# Patient Record
Sex: Male | Born: 1955 | Race: White | Hispanic: No | Marital: Married | Smoking: Former smoker
Health system: Southern US, Community
[De-identification: ages and names within clinical notes are randomized; demographics above are authoritative.]

## PROBLEM LIST (undated history)

## (undated) DIAGNOSIS — K5792 Diverticulitis of intestine, part unspecified, without perforation or abscess without bleeding: Secondary | ICD-10-CM

## (undated) DIAGNOSIS — I639 Cerebral infarction, unspecified: Secondary | ICD-10-CM

## (undated) DIAGNOSIS — M4802 Spinal stenosis, cervical region: Secondary | ICD-10-CM

## (undated) DIAGNOSIS — I2510 Atherosclerotic heart disease of native coronary artery without angina pectoris: Secondary | ICD-10-CM

## (undated) DIAGNOSIS — E039 Hypothyroidism, unspecified: Secondary | ICD-10-CM

## (undated) DIAGNOSIS — E1140 Type 2 diabetes mellitus with diabetic neuropathy, unspecified: Secondary | ICD-10-CM

## (undated) DIAGNOSIS — Z87442 Personal history of urinary calculi: Secondary | ICD-10-CM

## (undated) DIAGNOSIS — E119 Type 2 diabetes mellitus without complications: Secondary | ICD-10-CM

## (undated) DIAGNOSIS — Z800 Family history of malignant neoplasm of digestive organs: Secondary | ICD-10-CM

## (undated) DIAGNOSIS — C259 Malignant neoplasm of pancreas, unspecified: Secondary | ICD-10-CM

## (undated) DIAGNOSIS — I6522 Occlusion and stenosis of left carotid artery: Secondary | ICD-10-CM

## (undated) DIAGNOSIS — S129XXA Fracture of neck, unspecified, initial encounter: Secondary | ICD-10-CM

## (undated) DIAGNOSIS — I6502 Occlusion and stenosis of left vertebral artery: Secondary | ICD-10-CM

## (undated) HISTORY — PX: APPENDECTOMY: SHX54

## (undated) HISTORY — PX: SPLENECTOMY: SUR1306

## (undated) HISTORY — DX: Malignant neoplasm of pancreas, unspecified: C25.9

## (undated) HISTORY — PX: ROTATOR CUFF REPAIR: SHX139

## (undated) HISTORY — PX: COLON SURGERY: SHX602

## (undated) HISTORY — PX: CARDIAC SURGERY: SHX584

## (undated) HISTORY — PX: TONSILLECTOMY: SUR1361

## (undated) HISTORY — PX: EYE SURGERY: SHX253

## (undated) HISTORY — DX: Atherosclerotic heart disease of native coronary artery without angina pectoris: I25.10

## (undated) HISTORY — PX: COLON RESECTION: SHX5231

## (undated) HISTORY — DX: Family history of malignant neoplasm of digestive organs: Z80.0

---

## 1993-09-04 HISTORY — PX: COLON RESECTION: SHX5231

## 2018-01-19 ENCOUNTER — Emergency Department (HOSPITAL_COMMUNITY): Payer: BLUE CROSS/BLUE SHIELD

## 2018-01-19 ENCOUNTER — Other Ambulatory Visit: Payer: Self-pay

## 2018-01-19 ENCOUNTER — Inpatient Hospital Stay (HOSPITAL_COMMUNITY)
Admission: EM | Admit: 2018-01-19 | Discharge: 2018-01-22 | DRG: 062 | Disposition: A | Discharge status: Home / Self Care | Payer: BLUE CROSS/BLUE SHIELD | Attending: Neurology | Admitting: Neurology

## 2018-01-19 ENCOUNTER — Encounter (HOSPITAL_COMMUNITY): Payer: Self-pay | Admitting: Emergency Medicine

## 2018-01-19 DIAGNOSIS — Z79899 Other long term (current) drug therapy: Secondary | ICD-10-CM

## 2018-01-19 DIAGNOSIS — I672 Cerebral atherosclerosis: Secondary | ICD-10-CM | POA: Diagnosis present

## 2018-01-19 DIAGNOSIS — D72819 Decreased white blood cell count, unspecified: Secondary | ICD-10-CM | POA: Diagnosis present

## 2018-01-19 DIAGNOSIS — Z885 Allergy status to narcotic agent status: Secondary | ICD-10-CM | POA: Diagnosis not present

## 2018-01-19 DIAGNOSIS — G9589 Other specified diseases of spinal cord: Secondary | ICD-10-CM | POA: Diagnosis present

## 2018-01-19 DIAGNOSIS — R29701 NIHSS score 1: Secondary | ICD-10-CM | POA: Diagnosis present

## 2018-01-19 DIAGNOSIS — E871 Hypo-osmolality and hyponatremia: Secondary | ICD-10-CM | POA: Diagnosis present

## 2018-01-19 DIAGNOSIS — R200 Anesthesia of skin: Secondary | ICD-10-CM | POA: Diagnosis present

## 2018-01-19 DIAGNOSIS — I69351 Hemiplegia and hemiparesis following cerebral infarction affecting right dominant side: Secondary | ICD-10-CM | POA: Diagnosis not present

## 2018-01-19 DIAGNOSIS — I6523 Occlusion and stenosis of bilateral carotid arteries: Secondary | ICD-10-CM | POA: Diagnosis present

## 2018-01-19 DIAGNOSIS — G459 Transient cerebral ischemic attack, unspecified: Secondary | ICD-10-CM | POA: Diagnosis not present

## 2018-01-19 DIAGNOSIS — I10 Essential (primary) hypertension: Secondary | ICD-10-CM | POA: Diagnosis present

## 2018-01-19 DIAGNOSIS — Z87891 Personal history of nicotine dependence: Secondary | ICD-10-CM | POA: Diagnosis not present

## 2018-01-19 DIAGNOSIS — I63412 Cerebral infarction due to embolism of left middle cerebral artery: Principal | ICD-10-CM | POA: Diagnosis present

## 2018-01-19 DIAGNOSIS — E039 Hypothyroidism, unspecified: Secondary | ICD-10-CM | POA: Diagnosis present

## 2018-01-19 DIAGNOSIS — I63232 Cerebral infarction due to unspecified occlusion or stenosis of left carotid arteries: Secondary | ICD-10-CM | POA: Diagnosis not present

## 2018-01-19 DIAGNOSIS — E119 Type 2 diabetes mellitus without complications: Secondary | ICD-10-CM | POA: Diagnosis not present

## 2018-01-19 DIAGNOSIS — Z8673 Personal history of transient ischemic attack (TIA), and cerebral infarction without residual deficits: Secondary | ICD-10-CM | POA: Diagnosis not present

## 2018-01-19 DIAGNOSIS — E1165 Type 2 diabetes mellitus with hyperglycemia: Secondary | ICD-10-CM | POA: Diagnosis present

## 2018-01-19 DIAGNOSIS — E1140 Type 2 diabetes mellitus with diabetic neuropathy, unspecified: Secondary | ICD-10-CM | POA: Diagnosis present

## 2018-01-19 DIAGNOSIS — R29898 Other symptoms and signs involving the musculoskeletal system: Secondary | ICD-10-CM

## 2018-01-19 DIAGNOSIS — I6522 Occlusion and stenosis of left carotid artery: Secondary | ICD-10-CM | POA: Diagnosis not present

## 2018-01-19 DIAGNOSIS — I679 Cerebrovascular disease, unspecified: Secondary | ICD-10-CM

## 2018-01-19 DIAGNOSIS — R531 Weakness: Secondary | ICD-10-CM | POA: Diagnosis present

## 2018-01-19 DIAGNOSIS — I63239 Cerebral infarction due to unspecified occlusion or stenosis of unspecified carotid arteries: Secondary | ICD-10-CM

## 2018-01-19 DIAGNOSIS — I639 Cerebral infarction, unspecified: Secondary | ICD-10-CM

## 2018-01-19 DIAGNOSIS — Z7982 Long term (current) use of aspirin: Secondary | ICD-10-CM | POA: Diagnosis not present

## 2018-01-19 DIAGNOSIS — I63019 Cerebral infarction due to thrombosis of unspecified vertebral artery: Secondary | ICD-10-CM

## 2018-01-19 DIAGNOSIS — M4802 Spinal stenosis, cervical region: Secondary | ICD-10-CM

## 2018-01-19 DIAGNOSIS — Z7984 Long term (current) use of oral hypoglycemic drugs: Secondary | ICD-10-CM

## 2018-01-19 DIAGNOSIS — D696 Thrombocytopenia, unspecified: Secondary | ICD-10-CM | POA: Diagnosis present

## 2018-01-19 DIAGNOSIS — E785 Hyperlipidemia, unspecified: Secondary | ICD-10-CM | POA: Diagnosis present

## 2018-01-19 DIAGNOSIS — R2990 Unspecified symptoms and signs involving the nervous system: Secondary | ICD-10-CM

## 2018-01-19 DIAGNOSIS — I63119 Cerebral infarction due to embolism of unspecified vertebral artery: Secondary | ICD-10-CM

## 2018-01-19 DIAGNOSIS — E1159 Type 2 diabetes mellitus with other circulatory complications: Secondary | ICD-10-CM | POA: Diagnosis not present

## 2018-01-19 DIAGNOSIS — I6521 Occlusion and stenosis of right carotid artery: Secondary | ICD-10-CM | POA: Diagnosis not present

## 2018-01-19 DIAGNOSIS — E782 Mixed hyperlipidemia: Secondary | ICD-10-CM | POA: Diagnosis present

## 2018-01-19 HISTORY — DX: Spinal stenosis, cervical region: M48.02

## 2018-01-19 HISTORY — DX: Fracture of neck, unspecified, initial encounter: S12.9XXA

## 2018-01-19 HISTORY — DX: Cerebral infarction, unspecified: I63.9

## 2018-01-19 HISTORY — DX: Type 2 diabetes mellitus without complications: E11.9

## 2018-01-19 LAB — DIFFERENTIAL
BASOS ABS: 0 10*3/uL (ref 0.0–0.1)
BASOS PCT: 0 %
Eosinophils Absolute: 0 10*3/uL (ref 0.0–0.7)
Eosinophils Relative: 1 %
LYMPHS ABS: 1 10*3/uL (ref 0.7–4.0)
Lymphocytes Relative: 42 %
MONO ABS: 0.2 10*3/uL (ref 0.1–1.0)
MONOS PCT: 7 %
NEUTROS ABS: 1.2 10*3/uL — AB (ref 1.7–7.7)
Neutrophils Relative %: 50 %

## 2018-01-19 LAB — CBC
HEMATOCRIT: 37.7 % — AB (ref 39.0–52.0)
HEMOGLOBIN: 13.4 g/dL (ref 13.0–17.0)
MCH: 32.6 pg (ref 26.0–34.0)
MCHC: 35.5 g/dL (ref 30.0–36.0)
MCV: 91.7 fL (ref 78.0–100.0)
Platelets: 87 10*3/uL — ABNORMAL LOW (ref 150–400)
RBC: 4.11 MIL/uL — ABNORMAL LOW (ref 4.22–5.81)
RDW: 13.7 % (ref 11.5–15.5)
WBC: 2.5 10*3/uL — AB (ref 4.0–10.5)

## 2018-01-19 LAB — COMPREHENSIVE METABOLIC PANEL
ALK PHOS: 49 U/L (ref 38–126)
ALT: 17 U/L (ref 17–63)
AST: 17 U/L (ref 15–41)
Albumin: 4.1 g/dL (ref 3.5–5.0)
Anion gap: 7 (ref 5–15)
BILIRUBIN TOTAL: 1.4 mg/dL — AB (ref 0.3–1.2)
BUN: 20 mg/dL (ref 6–20)
CALCIUM: 9.1 mg/dL (ref 8.9–10.3)
CHLORIDE: 100 mmol/L — AB (ref 101–111)
CO2: 26 mmol/L (ref 22–32)
Creatinine, Ser: 0.69 mg/dL (ref 0.61–1.24)
Glucose, Bld: 233 mg/dL — ABNORMAL HIGH (ref 65–99)
Potassium: 3.6 mmol/L (ref 3.5–5.1)
Sodium: 133 mmol/L — ABNORMAL LOW (ref 135–145)
TOTAL PROTEIN: 7 g/dL (ref 6.5–8.1)

## 2018-01-19 LAB — I-STAT CHEM 8, ED
BUN: 20 mg/dL (ref 6–20)
CALCIUM ION: 1.16 mmol/L (ref 1.15–1.40)
CHLORIDE: 101 mmol/L (ref 101–111)
CREATININE: 0.7 mg/dL (ref 0.61–1.24)
GLUCOSE: 226 mg/dL — AB (ref 65–99)
HCT: 39 % (ref 39.0–52.0)
HEMOGLOBIN: 13.3 g/dL (ref 13.0–17.0)
Potassium: 3.6 mmol/L (ref 3.5–5.1)
Sodium: 138 mmol/L (ref 135–145)
TCO2: 24 mmol/L (ref 22–32)

## 2018-01-19 LAB — MRSA PCR SCREENING: MRSA BY PCR: NEGATIVE

## 2018-01-19 LAB — I-STAT TROPONIN, ED: TROPONIN I, POC: 0 ng/mL (ref 0.00–0.08)

## 2018-01-19 LAB — PROTIME-INR
INR: 1.08
Prothrombin Time: 13.9 seconds (ref 11.4–15.2)

## 2018-01-19 LAB — CBG MONITORING, ED: Glucose-Capillary: 228 mg/dL — ABNORMAL HIGH (ref 65–99)

## 2018-01-19 LAB — ETHANOL

## 2018-01-19 LAB — APTT: aPTT: 27 seconds (ref 24–36)

## 2018-01-19 MED ORDER — SODIUM CHLORIDE 0.9 % IV SOLN
INTRAVENOUS | Status: AC
  Administered 2018-01-19: 19:00:00 via INTRAVENOUS

## 2018-01-19 MED ORDER — SODIUM CHLORIDE 0.9 % IV SOLN
50.0000 mL | Freq: Once | INTRAVENOUS | Status: DC
Start: 2018-01-19 — End: 2018-01-19

## 2018-01-19 MED ORDER — SODIUM CHLORIDE 0.9 % IV SOLN
50.0000 mL/h | INTRAVENOUS | Status: DC
  Administered 2018-01-20 – 2018-01-21 (×2): 50 mL/h via INTRAVENOUS

## 2018-01-19 MED ORDER — ACETAMINOPHEN 650 MG RE SUPP: 650.0000 mg | RECTAL | Status: DC | PRN

## 2018-01-19 MED ORDER — ACETAMINOPHEN 325 MG PO TABS
650.0000 mg | ORAL_TABLET | ORAL | Status: DC | PRN
  Administered 2018-01-20 – 2018-01-21 (×3): 650 mg via ORAL
  Filled 2018-01-19 (×3): qty 2

## 2018-01-19 MED ORDER — SODIUM CHLORIDE 0.9 % IV SOLN: 50.0000 mL | Freq: Once | INTRAVENOUS | Status: DC

## 2018-01-19 MED ORDER — ALTEPLASE (STROKE) FULL DOSE INFUSION: 0.9000 mg/kg | Freq: Once | INTRAVENOUS | Status: DC

## 2018-01-19 MED ORDER — LABETALOL HCL 5 MG/ML IV SOLN: 10.0000 mg | Freq: Once | INTRAVENOUS | Status: DC | PRN

## 2018-01-19 MED ORDER — NICARDIPINE HCL IN NACL 20-0.86 MG/200ML-% IV SOLN: 0.0000 mg/h | INTRAVENOUS | Status: DC | PRN

## 2018-01-19 MED ORDER — STROKE: EARLY STAGES OF RECOVERY BOOK
Freq: Once | Status: AC
  Administered 2018-01-20: 01:00:00
  Filled 2018-01-19: qty 1

## 2018-01-19 MED ORDER — ALTEPLASE (STROKE) FULL DOSE INFUSION
0.9000 mg/kg | Freq: Once | INTRAVENOUS | Status: AC
  Administered 2018-01-19: 63.7 mg via INTRAVENOUS

## 2018-01-19 MED ORDER — ALTEPLASE 100 MG IV SOLR
INTRAVENOUS | Status: AC
  Administered 2018-01-19: 63.7 mg via INTRAVENOUS
  Filled 2018-01-19: qty 100

## 2018-01-19 MED ORDER — PANTOPRAZOLE SODIUM 40 MG PO TBEC
40.0000 mg | DELAYED_RELEASE_TABLET | Freq: Every day | ORAL | Status: DC
  Administered 2018-01-20 – 2018-01-22 (×3): 40 mg via ORAL
  Filled 2018-01-19 (×3): qty 1

## 2018-01-19 MED ORDER — ACETAMINOPHEN 160 MG/5ML PO SOLN: 650.0000 mg | ORAL | Status: DC | PRN

## 2018-01-19 MED ORDER — INSULIN ASPART 100 UNIT/ML ~~LOC~~ SOLN
0.0000 [IU] | Freq: Three times a day (TID) | SUBCUTANEOUS | Status: DC
  Administered 2018-01-20 (×2): 3 [IU] via SUBCUTANEOUS
  Administered 2018-01-20: 5 [IU] via SUBCUTANEOUS
  Administered 2018-01-21: 2 [IU] via SUBCUTANEOUS
  Administered 2018-01-21 (×2): 3 [IU] via SUBCUTANEOUS
  Administered 2018-01-22: 2 [IU] via SUBCUTANEOUS
  Administered 2018-01-22: 3 [IU] via SUBCUTANEOUS

## 2018-01-19 NOTE — Progress Notes (Signed)
Code Stroke Times:  1748 Call Time  [place] Exam Started 5003 Exam Finished 7048 Images sent  8891 Exam completed [place] [facility] called

## 2018-01-19 NOTE — ED Notes (Signed)
Call CT Code Stroke.@ 1750

## 2018-01-19 NOTE — ED Triage Notes (Signed)
Pt presents with right hand numbness, decreased sensation  And unable to open hand completley approx 3pm

## 2018-01-19 NOTE — ED Notes (Signed)
Beeped out Code Stroke

## 2018-01-19 NOTE — ED Provider Notes (Signed)
[facility] EMERGENCY DEPARTMENT Provider Note   CSN: [id number] Arrival date & time: 01/19/18  1741   An emergency department physician performed an initial assessment on this suspected stroke patient at 1750.  History   Chief Complaint Chief Complaint  Patient presents with  . Code Stroke    HPI Mark Foster is a 62 y.o. male.  The history is provided by the patient and the spouse. The history is limited by the condition of the patient (acuity of condition).   Pt was seen at 1750. Per pt and his wife: Pt states they were sitting and he went to brush a fly off of his wife's face with his right hand when he realized that he could not move his hand as usual and "it was all numb." Pt states he feels his RUE "isn't working right" by arrival to ED. Pt states his symptoms began at 1500 today PTA. Pt is right handed. Denies headache, no new neck or back pain, no CP/SOB, no abd pain, no N/V/D, no injury.    Past Medical History:  Diagnosis Date  . Cervical compression fracture ([place])   . Cervical spinal stenosis   . Stroke [facility])    2011    There are no active problems to display for this patient.   History reviewed. No pertinent surgical history.      Home Medications    Prior to Admission medications   Not on File    Family History No family history on file.  Social History Social History   Tobacco Use  . Smoking status: Never Smoker  . Smokeless tobacco: Never Used  Substance Use Topics  . Alcohol use: Yes  . Drug use: Never     Allergies   Patient has no known allergies.   Review of Systems Review of Systems  Unable to perform ROS: Acuity of condition     Physical Exam Updated Vital Signs Ht 5\' 11"  (1.803 m)   Wt 70.8 kg (156 lb)   BMI 21.76 kg/m    Patient Vitals for the past 24 hrs:  BP Temp Pulse Resp SpO2 Height Weight  01/19/18 1927 113/86 97.7 F (36.5 C) 95 20 97 % - -  01/19/18 1912 124/64 97.7 F (36.5 C) 95 18 96 % - -  01/19/18  1857 (!) 142/77 (!) 97.4 F (36.3 C) 98 15 96 % - -  01/19/18 1842 136/80 (!) 97.4 F (36.3 C) 99 14 97 % - -  01/19/18 1837 - 98.1 F (36.7 C) - - - - -  01/19/18 1830 135/81 - (!) 102 17 97 % - -  01/19/18 1828 (!) 141/78 - (!) 105 (!) 23 98 % - -  01/19/18 1817 - - - - - - 70.8 kg (156 lb)  01/19/18 1755 - - - - - 5\' 11"  (1.803 m) 65.8 kg (145 lb)     Physical Exam 1750: Physical examination:  Nursing notes reviewed; Vital signs and O2 SAT reviewed;  Constitutional: Well developed, Well nourished, Well hydrated, In no acute distress; Head:  Normocephalic, atraumatic; Eyes: EOMI, PERRL, No scleral icterus; ENMT: Mouth and pharynx normal, Mucous membranes moist; Neck: Supple, Full range of motion, No lymphadenopathy; Cardiovascular: Regular rate and rhythm, No gallop; Respiratory: Breath sounds clear & equal bilaterally, No wheezes.  Speaking full sentences with ease, Normal respiratory effort/excursion; Chest: Nontender, Movement normal; Abdomen: Soft, Nontender, Nondistended, Normal bowel sounds; Genitourinary: No CVA tenderness; Extremities: Peripheral pulses normal, No tenderness, No edema, No calf  edema or asymmetry.; Neuro: AA&Ox3, Major CN grossly intact. Speech clear.  No facial droop.  Grips L>R. Strength 5/5 equal bilat LE's and LUE, strength 4/5 RUE.  DTR 2/4 equal bilat UE's and LE's. +right hand subjective decreased sensation.  Normal cerebellar testing LUE and ataxic RUE (finger-nose), ataxic bilat LE's (heel-shin)..; Skin: Color normal, Warm, Dry.   ED Treatments / Results  Labs (all labs ordered are listed, but only abnormal results are displayed)   EKG EKG Interpretation  Date/Time:  Saturday Jan 19 2018 18:20:24 EDT Ventricular Rate:  98 PR Interval:    QRS Duration: 94 QT Interval:  348 QTC Calculation: 445 R Axis:   67 Text Interpretation:  Sinus rhythm No old tracing to compare Confirmed by Francine Graven [phone]) on 01/19/2018 6:22:54  PM   Radiology   Procedures Procedures (including critical care time)  Medications Ordered in ED Medications - No data to display   Initial Impression / Assessment and Plan / ED Course  I have reviewed the triage vital signs and the nursing notes.  Pertinent labs & imaging results that were available during my care of the patient were reviewed by me and considered in my medical decision making (see chart for details).  MDM Reviewed: previous chart, nursing note and vitals Reviewed previous: labs and ECG Interpretation: labs, ECG and CT scan Total time providing critical care: 30-74 minutes. This excludes time spent performing separately reportable procedures and services. Consults: neurology and admitting MD    CRITICAL CARE Performed by: Alfonzo Feller Total critical care time: 35 minutes Critical care time was exclusive of separately billable procedures and treating other patients. Critical care was necessary to treat or prevent imminent or life-threatening deterioration. Critical care was time spent personally by me on the following activities: development of treatment plan with patient and/or surrogate as well as nursing, discussions with consultants, evaluation of patient's response to treatment, examination of patient, obtaining history from patient or surrogate, ordering and performing treatments and interventions, ordering and review of laboratory studies, ordering and review of radiographic studies, pulse oximetry and re-evaluation of patient's condition.   Results for orders placed or performed during the hospital encounter of 01/19/18  Protime-INR  Result Value Ref Range   Prothrombin Time 13.9 11.4 - 15.2 seconds   INR 1.08   APTT  Result Value Ref Range   aPTT 27 24 - 36 seconds  CBC  Result Value Ref Range   WBC 2.5 (L) 4.0 - 10.5 K/uL   RBC 4.11 (L) 4.22 - 5.81 MIL/uL   Hemoglobin 13.4 13.0 - 17.0 g/dL   HCT 37.7 (L) 39.0 - 52.0 %   MCV 91.7 78.0 -  100.0 fL   MCH 32.6 26.0 - 34.0 pg   MCHC 35.5 30.0 - 36.0 g/dL   RDW 13.7 11.5 - 15.5 %   Platelets 87 (L) 150 - 400 K/uL  Differential  Result Value Ref Range   Neutrophils Relative % 50 %   Neutro Abs 1.2 (L) 1.7 - 7.7 K/uL   Lymphocytes Relative 42 %   Lymphs Abs 1.0 0.7 - 4.0 K/uL   Monocytes Relative 7 %   Monocytes Absolute 0.2 0.1 - 1.0 K/uL   Eosinophils Relative 1 %   Eosinophils Absolute 0.0 0.0 - 0.7 K/uL   Basophils Relative 0 %   Basophils Absolute 0.0 0.0 - 0.1 K/uL  I-Stat Chem 8, ED  (not at [facility], [facility])  Result Value Ref Range   Sodium 138 135 - 145  mmol/L   Potassium 3.6 3.5 - 5.1 mmol/L   Chloride 101 101 - 111 mmol/L   BUN 20 6 - 20 mg/dL   Creatinine, Ser 0.70 0.61 - 1.24 mg/dL   Glucose, Bld 226 (H) 65 - 99 mg/dL   Calcium, Ion 1.16 1.15 - 1.40 mmol/L   TCO2 24 22 - 32 mmol/L   Hemoglobin 13.3 13.0 - 17.0 g/dL   HCT 39.0 39.0 - 52.0 %  CBG monitoring, ED  Result Value Ref Range   Glucose-Capillary 228 (H) 65 - 99 mg/dL    Ct Head Code Stroke Wo Contrast Result Date: 01/19/2018 CLINICAL DATA:  Code stroke. RIGHT hand numbness and decreased sensation since this afternoon. EXAM: CT HEAD WITHOUT CONTRAST TECHNIQUE: Contiguous axial images were obtained from the base of the skull through the vertex without intravenous contrast. COMPARISON:  None. FINDINGS: BRAIN: No intraparenchymal hemorrhage, mass effect nor midline shift. LEFT anterior parietal lobe encephalomalacia. Mild ex vacuo dilatation LEFT lateral ventricle, borderline parenchymal brain volume loss for age. No hydrocephalus. No acute large vascular territory infarct. No abnormal extra-axial fluid collections. Symmetric basal ganglia mineralization. The basal cisterns are patent. VASCULAR: Moderate to severe calcific atherosclerosis of the carotid siphons. SKULL: No skull fracture. No significant scalp soft tissue swelling. SINUSES/ORBITS: The mastoid air-cells and included paranasal sinuses are  well-aerated.The included ocular globes and orbital contents are non-suspicious. OTHER: Patient is edentulous. ASPECTS [facility] Stroke Program Early CT Score) - Ganglionic level infarction (caudate, lentiform nuclei, internal capsule, insula, M1-M3 cortex): 7 - Supraganglionic infarction (M4-M6 cortex): 3 Total score (0-10 with 10 being normal): 10 IMPRESSION: 1. No acute intracranial process. 2. ASPECTS is 10. 3. Old small LEFT parietal/MCA territory infarct. 4. Critical Value/emergent results were called by telephone at the time of interpretation on 01/19/2018 at 6:05 pm to Dr. Francine Graven , who verbally acknowledged these results. Electronically Signed   By: Elon Alas M.D.   On: 01/19/2018 18:07    1820:  Code Stroke called on pt's arrival. TeleNeuro Dr. Beau Fanny has evaluated pt, recommends TPA, he explained risks/benefits to pt and pt wishes to proceed. TPA ordered.  T/C returned from [facility] Neuro Dr. Rory Percy, case discussed, including:  HPI, pertinent PM/SHx, VS/PE, dx testing, ED course and treatment:  Agreeable to accept transfer/admit, requests to write temporary orders, obtain Neuro ICU bed to his service/Neuro MD service.   Final Clinical Impressions(s) / ED Diagnoses   Final diagnoses:  None    ED Discharge Orders    None       Francine Graven, DO 01/23/18 0719

## 2018-01-19 NOTE — H&P (Signed)
Neurology H&P  CC: Right arm weakness  History is obtained from: Patient  HPI: Mark Foster is a 62 y.o. male with a history of cervical stenosis, diabetic neuropathy and type 2 diabetes as well as previous stroke who presents with right arm weakness that started abruptly around 3 PM.  He states that he was going to bat away a bug and found that his hand would not work.  [facility] was evaluated by tele-neurology and IV TPA was administered.  He did have some neck pain earlier today.    LKW: 3 PM tpa given?:  Yes Modified Rankin Scale: 1-No significant post stroke disability and can perform usual duties with stroke symptoms  ROS: A 14 point ROS was performed and is negative except as noted in the HPI.   Past Medical History:  Diagnosis Date  . Cervical compression fracture ([place])   . Cervical spinal stenosis   . Stroke [facility])    2011  . Type 2 diabetes mellitus ([facility])      No family history on file.   Social History:  reports that he has never smoked. He has never used smokeless tobacco. He reports that he drinks alcohol. He reports that he does not use drugs.   Exam: Current vital signs: BP 121/76   Pulse 89   Temp 97.7 F (36.5 C)   Resp 20   Ht 5\' 11"  (1.803 m)   Wt 70.8 kg (156 lb)   SpO2 99%   BMI 21.76 kg/m  Vital signs in last 24 hours: Temp:  [97.4 F (36.3 C)-98.1 F (36.7 C)] 97.7 F (36.5 C) (05/18 1942) Pulse Rate:  [89-105] 89 (05/18 2045) Resp:  [14-23] 20 (05/18 2045) BP: (113-142)/(64-86) 121/76 (05/18 2045) SpO2:  [96 %-100 %] 99 % (05/18 2045) Weight:  [65.8 kg (145 lb)-70.8 kg (156 lb)] 70.8 kg (156 lb) (05/18 1817)  Physical Exam  Constitutional: Appears well-developed and well-nourished.  Psych: Affect appropriate to situation Eyes: No scleral injection HENT: No OP obstrucion Head: Normocephalic.  Cardiovascular: Normal rate and regular rhythm.  Respiratory: Effort normal and breath sounds  normal to anterior ascultation GI: Soft.  No distension. There is no tenderness.  Skin: WDI  Neuro: Mental Status: Patient is awake, alert, oriented to person, place, month, year, and situation. Patient is able to give a clear and coherent history. No signs of aphasia or neglect Cranial Nerves: II: Visual Fields are full. Pupils are equal, round, and reactive to light.   III,IV, VI: EOMI without ptosis or diploplia.  V: Facial sensation is symmetric to temperature VII: Facial movement is symmetric.  VIII: hearing is intact to voice X: Uvula elevates symmetrically XI: Shoulder shrug is symmetric. XII: tongue is midline without atrophy or fasciculations.  Motor: Tone is normal. Bulk is normal.  He has 3 out of 5 distally, 4+ out of 5 proximally in his right arm, he is 4-4+/5 in his left arm, and 4/5 bilaterally in his legs. Sensory: Sensation is diminished to temperature to above the knees bilaterally  Deep Tendon Reflexes: 2+ and symmetric at the patellae.  And trace at the ankles bilaterally. 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  I would favor getting an MRI of the cervical spine as well.  Recommendations: 1. HgbA1c, fasting lipid panel 2. MRI, MRA  of the brain without contrast, MRA neck 3. Frequent neuro checks 4. Echocardiogram 5.  MRI cervical spine 6.  Prophylactic therapy-none for 24 hours 7. Risk factor modification 8. Telemetry monitoring 9. PT consult, OT consult, Speech consult 10. please page stroke NP  Or  PA  Or MD  from 8am -4 pm as this patient will be followed by the stroke team at this point.   You can look them up on [email or web address]    Roland Rack, MD [facility] [phone]  If 7pm- 7am, please page neurology on call as listed in [place]. 01/19/2018  9:14 PM

## 2018-01-19 NOTE — Consult Note (Signed)
[facility] TeleNeurology Consult Services  Impression:  Stroke  Symptoms not consistent with LVO therefore no NIR  Differential Diagnosis:  1. Cardioembolic stroke  2. Small vessel disease/lacune  3. Thromboembolic, artery-to-artery mechanism  4. Hypercoagulable state-related infarct  5. Transient ischemic attack  6. Thrombotic mechanism, large artery disease   Comments:   LKW: 15:00 Door time:  17:41 [facility] contacted: 18:00 [facility] at bedside: 18:05 NIHSS assessment time: 18:06 Verbal tpa order:18:15 Needle time: 18:28  Verbal Consent to tPA:  I have explained to the patient/family/guardian the nature of the patient's condition, the use of tPA fibrinolytic agent, and the benefits to be reasonably expected compared with alternative approaches. I have discussed the likelihood of major risks or complications of this procedure including (if applicable) but not limited to loss of limb function, brain damage, paralysis, hemorrhage, infection, complications from transfusion of blood components, drug reactions, blood clots and loss of life. I have also indicated that with any procedure there is always the possibility of an unexpected complication. I have explained the risks which include:    1. Death, Stroke or permanent neurologic injury (paralysis, coma, etc)   2. Worsening of stroke symptoms from swelling or bleeding in the brain   3. Bleeding in other parts of the body   4. Need for blood transfusions to replace blood or clotting factors   5. Allergic reaction to medications   6. Other unexpected complications    All questions were answered and the patient/family/guardian express understanding of the treatment plan and consent to the procedure.  Our recommendations are outlined below.   We will be seeing the patient back in follow up as noted.    Recommendations:   IV tPA - dose = 63.7 mg Routine post tPA monitoring including neuro checks and blood  pressure control during/after treatment Monitor blood pressure Check blood pressure and NIHSS every 15 min for 2 h, then every 30 min for 6 h, and finally every hour for 16 h  Systolic greater than 381 OR diastolic greater than 017: Option 1: Labetalol 10 mg IV for 1 - 2 min May repeat or double labetalol every 10 min to maximum dose of 300 mg, or give initial labetalol dose, then start labetalol drip at 2 - 8 mg/min. Option 2: Nicardipine 5 mg/h IV infusion as initial dose and titrate to desired effect by increasing 2.5 mg/h every 5 min to maximum of 15 mg/h;  If blood pressure is not controlled by labetolol or nicardipine, consider sodium nitroprusside.  Admission to ICU CT brain 24 hours post tPA NPO until swallowing screen performed and passed No antiplatelet agents or anticoagulants (including heparin for DVT prophylaxis) in first 24 hours No Foley catheter, nasogastric tube, arterial catheter or central venous catheter for 24 hr, unless absolutely necessary Telemetry   Inpatient Neurology Consultation Stroke evaluation as per inpatient neurology recommendations Discussed with ED MD   ------------------------------------------------------------------------------  CC: Right hand weakness  History of Present Illness   Patient is a  62 YO M with h/o stroke and Lt ICA occlusion and spinal stenosis presented with right hand weakness that started suddenly at 15:00. His right hand is weak and numb. No other deficits. Risks/benefits and contraindications of TPA were discussed with patient and his wife. No contraindications and he wanted to proceed with TPA.  Diagnostics CT head: No acute findings.  Exam  NIH Stroke Scale/Score (NIHSS)   RESULT SUMMARY: 1 points NIH Stroke Scale   INPUTS: 1A: Level of consciousness ->  0 = Alert; keenly responsive 1B: Ask month and age -> 0 = Both questions right 1C: 'Blink eyes' & 'squeeze hands' -> 0 = Performs both tasks 2: Horizontal  extraocular movements -> 0 = Normal 3: Visual fields -> 0 = No visual loss 4: Facial palsy -> 0 = Normal symmetry 5A: Left arm motor drift -> 0 = No drift for 10 seconds 5B: Right arm motor drift -> 0 = No drift for 10 seconds 6A: Left leg motor drift -> 0 = No drift for 5 seconds 6B: Right leg motor drift -> 0 = No drift for 5 seconds 7: Limb Ataxia -> 0 = No ataxia 8: Sensation -> 1 = Mild-moderate loss: less sharp/more dull  9: Language/aphasia -> 0 = Normal; no aphasia 10: Dysarthria -> 0 = Normal 11: Extinction/inattention -> 0 = No abnormality  Medical Decision Making:  - Extensive number of diagnosis or management options are considered above.   - Extensive amount of complex data reviewed.   - High risk of complication and/or morbidity or mortality are associated with differential diagnostic considerations above.  - There may be Uncertain outcome and increased probability of prolonged functional impairment or high probability of severe prolonged functional impairment associated with some of these differential diagnosis.  Medical Data Reviewed:  1.Data reviewed include clinical labs, radiology,  Medical Tests;   2.Tests results discussed w/performing or interpreting physician;   3.Obtaining/reviewing old medical records;  4.Obtaining case history from another source;  5.Independent review of image, tracing or specimen.    Patient was informed the neurology consult would happen via telehealth consult by way of interactive audio and video telecommunications and consented to receiving care in this manner.

## 2018-01-19 NOTE — ED Notes (Signed)
Alteplase bolus of 6.4 ml adm at this time. Tele neuro at bedside

## 2018-01-20 ENCOUNTER — Inpatient Hospital Stay (HOSPITAL_COMMUNITY): Payer: BLUE CROSS/BLUE SHIELD

## 2018-01-20 DIAGNOSIS — I6521 Occlusion and stenosis of right carotid artery: Secondary | ICD-10-CM

## 2018-01-20 DIAGNOSIS — E1159 Type 2 diabetes mellitus with other circulatory complications: Secondary | ICD-10-CM

## 2018-01-20 DIAGNOSIS — E785 Hyperlipidemia, unspecified: Secondary | ICD-10-CM

## 2018-01-20 DIAGNOSIS — I63232 Cerebral infarction due to unspecified occlusion or stenosis of left carotid arteries: Secondary | ICD-10-CM

## 2018-01-20 LAB — HEMOGLOBIN A1C
HEMOGLOBIN A1C: 9.7 % — AB (ref 4.8–5.6)
MEAN PLASMA GLUCOSE: 231.69 mg/dL

## 2018-01-20 LAB — CBC
HEMATOCRIT: 35.5 % — AB (ref 39.0–52.0)
HEMOGLOBIN: 12.6 g/dL — AB (ref 13.0–17.0)
MCH: 33.2 pg (ref 26.0–34.0)
MCHC: 35.5 g/dL (ref 30.0–36.0)
MCV: 93.4 fL (ref 78.0–100.0)
Platelets: 88 10*3/uL — ABNORMAL LOW (ref 150–400)
RBC: 3.8 MIL/uL — ABNORMAL LOW (ref 4.22–5.81)
RDW: 13.6 % (ref 11.5–15.5)
WBC: 2.7 10*3/uL — ABNORMAL LOW (ref 4.0–10.5)

## 2018-01-20 LAB — URINALYSIS, ROUTINE W REFLEX MICROSCOPIC
BACTERIA UA: NONE SEEN
Bilirubin Urine: NEGATIVE
Glucose, UA: >=500 mg/dL — AB
Hgb urine dipstick: NEGATIVE
KETONES UR: NEGATIVE mg/dL
Leukocytes, UA: NEGATIVE
Nitrite: NEGATIVE
PH: 6 (ref 5.0–8.0)
Protein, ur: NEGATIVE mg/dL
SPECIFIC GRAVITY, URINE: 1.017 (ref 1.005–1.030)

## 2018-01-20 LAB — HIV ANTIBODY (ROUTINE TESTING W REFLEX): HIV Screen 4th Generation wRfx: NONREACTIVE

## 2018-01-20 LAB — LIPID PANEL
Cholesterol: 168 mg/dL (ref 0–200)
HDL: 45 mg/dL (ref >40)
LDL CALC: 113 mg/dL — AB (ref 0–99)
TRIGLYCERIDES: 51 mg/dL (ref <150)
Total CHOL/HDL Ratio: 3.7 RATIO
VLDL: 10 mg/dL (ref 0–40)

## 2018-01-20 LAB — GLUCOSE, CAPILLARY
GLUCOSE-CAPILLARY: 225 mg/dL — AB (ref 65–99)
GLUCOSE-CAPILLARY: 203 mg/dL — AB (ref 65–99)
Glucose-Capillary: 198 mg/dL — ABNORMAL HIGH (ref 65–99)
Glucose-Capillary: 167 mg/dL — ABNORMAL HIGH (ref 65–99)
Glucose-Capillary: 172 mg/dL — ABNORMAL HIGH (ref 65–99)

## 2018-01-20 LAB — RAPID URINE DRUG SCREEN, HOSP PERFORMED
AMPHETAMINES: NOT DETECTED
BARBITURATES: NOT DETECTED
Benzodiazepines: NOT DETECTED
COCAINE: NOT DETECTED
Opiates: NOT DETECTED
TETRAHYDROCANNABINOL: POSITIVE — AB

## 2018-01-20 MED ORDER — ATORVASTATIN CALCIUM 80 MG PO TABS
80.0000 mg | ORAL_TABLET | Freq: Every day | ORAL | Status: DC
  Administered 2018-01-20 – 2018-01-21 (×2): 80 mg via ORAL
  Filled 2018-01-20 (×2): qty 1

## 2018-01-20 MED ORDER — GADOBENATE DIMEGLUMINE 529 MG/ML IV SOLN
13.0000 mL | Freq: Once | INTRAVENOUS | Status: AC | PRN
  Administered 2018-01-20: 13 mL via INTRAVENOUS
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
## 2018-01-20 NOTE — Evaluation (Signed)
Occupational Therapy Evaluation Patient Details Name: Mark Foster MRN: [id number] DOB: 07-16-1956 Today's Date: 01/20/2018    History of Present Illness 62 y.o. male with history of a previous stroke, hypothyroidism, cervical spinal stenosis, and diabetes mellitus, presenting with right arm weakness. The patient received IV TPA Saturday 01/19/2018. Imaging showing Chronic left posterior MCA infarct and Abnormal flow void within the left ICA, consistent with occlusion per report.   Clinical Impression   Pt admitted with above. He demonstrates the below listed deficits and will benefit from continued OT to maximize safety and independence with BADLs.  Pt presents to OT with Rt UE weakness as well as very poor balance.  He currently requires mod A for LB ADLs and functional tranfers.  He is at risk for falls due to severity of balance deificts.  Recommend CIR to allow him to return safely to mod I level, and reduce risk of injury and readmission.       Follow Up Recommendations  CIR;Supervision/Assistance - 24 hour    Equipment Recommendations  3 in 1 bedside commode;Tub/shower seat    Recommendations for Other Services Rehab consult     Precautions / Restrictions Precautions Precautions: Fall Restrictions Weight Bearing Restrictions: No      Mobility Bed Mobility               General bed mobility comments: up in chair   Transfers Overall transfer level: Needs assistance Equipment used: None Transfers: Sit to/from Stand Sit to Stand: Min assist         General transfer comment: min A to steady     Balance Overall balance assessment: Needs assistance Sitting-balance support: Feet supported Sitting balance-Leahy Scale: Good Sitting balance - Comments: able to don/doff socks  Postural control: Posterior lean Standing balance support: During functional activity;Single extremity supported Standing balance-Leahy Scale: Poor Standing balance comment: heavy posterior  bias                            ADL either performed or assessed with clinical judgement   ADL Overall ADL's : Needs assistance/impaired Eating/Feeding: Modified independent;Sitting   Grooming: Wash/dry hands;Wash/dry face;Oral care;Brushing hair;Moderate assistance;Standing   Upper Body Bathing: Set up;Supervision/ safety;Sitting   Lower Body Bathing: Moderate assistance;Sit to/from stand   Upper Body Dressing : Minimal assistance;Sitting Upper Body Dressing Details (indicate cue type and reason): assist for fasteners  Lower Body Dressing: Sit to/from stand;Moderate assistance Lower Body Dressing Details (indicate cue type and reason): assist for balance and for fasteners  Toilet Transfer: Moderate assistance;Stand-pivot;BSC Toilet Transfer Details (indicate cue type and reason): Pt very unsteady.  Heavy posterior bias  Toileting- Clothing Manipulation and Hygiene: Moderate assistance;Sit to/from stand       Functional mobility during ADLs: Moderate assistance General ADL Comments: pt requires assist for balance      Vision Baseline Vision/History: Wears glasses Wears Glasses: At all times Patient Visual Report: No change from baseline Vision Assessment?: Yes Eye Alignment: Within Functional Limits Ocular Range of Motion: Within Functional Limits Alignment/Gaze Preference: Within Defined Limits Tracking/Visual Pursuits: Able to track stimulus in all quads without difficulty Saccades: Within functional limits Convergence: Within functional limits Visual Fields: No apparent deficits     Agricultural engineer Tested?: Yes   Praxis Praxis Praxis tested?: Within functional limits    Pertinent Vitals/Pain Pain Assessment: Faces Faces Pain Scale: No hurt     Hand Dominance Right   Extremity/Trunk Assessment Upper Extremity Assessment Upper  Extremity Assessment: RUE deficits/detail RUE Deficits / Details: shoulder 4-/5; elbow 3+/5; wrist 3-/5;  hand 3/5 - 3-/5   RUE Sensation: decreased light touch RUE Coordination: decreased fine motor   Lower Extremity Assessment Lower Extremity Assessment: Defer to PT evaluation       Communication     Cognition Arousal/Alertness: Awake/alert Behavior During Therapy: WFL for tasks assessed/performed Overall Cognitive Status: Within Functional Limits for tasks assessed                                 General Comments: WFL for basic tasks, but would benefit from further testing    General Comments  VSS     Exercises     Shoulder Instructions      Home Living Family/patient expects to be discharged to:: Private residence Living Arrangements: Alone Available Help at Discharge: Family;Available PRN/intermittently Type of Home: House Home Access: Stairs to enter [employer] of Steps: 3 Entrance Stairs-Rails: Can reach both Home Layout: One level     Bathroom Shower/Tub: Occupational psychologist: Standard     Home Equipment: Cane - quad;Cane - single point;Walker - 2 wheels          Prior Functioning/Environment Level of Independence: Independent with assistive device(s)        Comments: Pt retired from [employer] job last year due to balance issues.  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  He uses a 4 point cane for mobility, at times can ambulate in home without        OT Problem List: Decreased strength;Decreased activity tolerance;Impaired balance (sitting and/or standing);Decreased coordination;Decreased safety awareness;Decreased knowledge of use of DME or AE;Impaired sensation;Impaired UE functional use      OT Treatment/Interventions: Self-care/ADL training;Neuromuscular education;Therapeutic exercise;DME and/or AE instruction;Therapeutic activities;Cognitive remediation/compensation;Patient/family education;Balance training    OT Goals(Current goals can be found in the care plan section) Acute Rehab OT Goals Patient Stated  Goal: to play guitar and ride motorcycle  OT Goal Formulation: With patient Time For Goal Achievement: 02/03/18 Potential to Achieve Goals: Good ADL Goals Pt Will Perform Grooming: with min guard assist;standing Pt Will Perform Lower Body Bathing: with min guard assist;sit to/from stand Pt Will Perform Lower Body Dressing: with min guard assist;sit to/from stand Pt Will Transfer to Toilet: with min guard assist;ambulating;regular height toilet;bedside commode;grab bars Pt Will Perform Toileting - Clothing Manipulation and hygiene: with min guard assist;sit to/from stand Pt/caregiver will Perform Home Exercise Program: Increased strength;Right Upper extremity;With written HEP provided;With theraputty;With theraband Additional ADL Goal #1: Pt will be independent with managing fasters with Rt hand, and will be able to translate objects from palm <> fingertips Rt hand. .  OT Frequency: Min 2X/week   Barriers to D/C:            Co-evaluation              AM-PAC PT "6 Clicks" Daily Activity     Outcome Measure Help from another person eating meals?: None Help from another person taking care of personal grooming?: A Little Help from another person toileting, which includes using toliet, bedpan, or urinal?: A Lot Help from another person bathing (including washing, rinsing, drying)?: A Little Help from another person to put on and taking off regular upper body clothing?: A Little Help from another person to put on and taking off regular lower body clothing?: A Lot 6 Click Score: 17   End  of Session Equipment Utilized During Treatment: Gait belt Nurse Communication: Mobility status  Activity Tolerance: Patient tolerated treatment well Patient left: in chair;with call bell/phone within reach;with nursing/sitter in room;with chair alarm set  OT Visit Diagnosis: Unsteadiness on feet (R26.81)                Time: 7276-1848 OT Time Calculation (min): 24 min Charges:  OT General  Charges $OT Visit: 1 Visit OT Evaluation $OT Eval Moderate Complexity: 1 Mod OT Treatments $Neuromuscular Re-education: 8-22 mins G-Codes:     [facility], OTR/L [phone]   Lucille Passy M 01/20/2018, 5:03 PM

## 2018-01-20 NOTE — Evaluation (Signed)
Speech Language Pathology Evaluation Patient Details Name: Mark Foster MRN: [id number] DOB: March 07, 1956 Today's Date: 01/20/2018 Time: 1010-1030 SLP Time Calculation (min) (ACUTE ONLY): 20 min  Problem List:  Patient Active Problem List   Diagnosis Date Noted  . Stroke ([place]) 01/19/2018  . Stroke (cerebrum) ([place]) 01/19/2018   Past Medical History:  Past Medical History:  Diagnosis Date  . Cervical compression fracture ([place])   . Cervical spinal stenosis   . Stroke [facility]    2011  . Type 2 diabetes mellitus ([place])    Past Surgical History: History reviewed. No pertinent surgical history. HPI:  Jakarie Foster is a 62 y.o. male with a history of cervical stenosis, diabetic neuropathy and type 2 diabetes as well as previous stroke who presented with right arm weakness. He states that he was going to bat away a bug and found that his hand would not work.  He therefore presented to [facility] where he was evaluated by tele-neurology and IV TPA was administered. MRI 01/20/18 revealed no acute intracranial abnormality, chronic left posterior MCA infarct, occlusion of left ICA.   Assessment / Plan / Recommendation Clinical Impression  Patient presents with mild cognitive linguistic impairments. He scored 25/30 on MOCA Basic (>26 is WNL), demonstrating difficulties with delayed recall (3/5), functional problem solving/calculation (2/3), and generative naming (1/2). Pt does report difficulties with wordfinding and short term memory at baseline. Wordfinding difficulties noted x2 in conversation; pt eventually able to retrieve word after approximately 30 seconds and with some circumlocution. He was aware in the moment, stating "this happens to me sometimes." With further questioning, SLP established that pt has experienced this issue for approximately one year. He does have a chronic L MCA infarct per MRI; pt reports he had a stroke 8 years ago but was unaware. He reports he did not have issues with  communication initially. Question some mild impairment with auditory comprehension or processing; pt requested repetition, and asked questions to elicit rephrasing of multi-step or complex instructions during assessment. Recommend skilled ST to address cognitive-linguistic impairments; pt would benefit from further assessment of expressive and receptive language including reading and writing, may benefit from follow-up in outpatient setting.    SLP Assessment  SLP Recommendation/Assessment: Patient needs continued Speech Lanaguage Pathology Services SLP Visit Diagnosis: Aphasia (R47.01);Cognitive communication deficit (R41.841)    Follow Up Recommendations  Other (comment)(tba)    Frequency and Duration min 2x/week  2 weeks      SLP Evaluation Cognition  Overall Cognitive Status: History of cognitive impairments - at baseline Arousal/Alertness: Awake/alert Orientation Level: Oriented X4(aware in hospital in [place], cues required for name) Attention: Focused;Sustained;Selective Focused Attention: Appears intact Sustained Attention: Appears intact Selective Attention: Appears intact Memory: Impaired Memory Impairment: Decreased recall of new information Awareness: Appears intact Problem Solving: Impaired Problem Solving Impairment: Functional basic(2/3 ways to make $13) Executive Function: Reasoning;Sequencing;Organizing Reasoning: Appears intact Sequencing: Appears intact Organizing: Appears intact Safety/Judgment: Appears intact       Comprehension  Auditory Comprehension Overall Auditory Comprehension: Impaired Yes/No Questions: Within Functional Limits Commands: Impaired One Step Basic Commands: 75-100% accurate Two Step Basic Commands: 75-100% accurate Multistep Basic Commands: 75-100% accurate(requests repetition/ rephrasing x2) Complex Commands: 75-100% accurate(requests repetition/ rephrasing x2) Conversation: Other (comment)(mod complex`) EffectiveTechniques:  Repetition;Other (Comment)(rephrasing) Visual Recognition/Discrimination Discrimination: Within Function Limits Reading Comprehension Reading Status: Not tested    Expression Expression Primary Mode of Expression: Verbal Verbal Expression Overall Verbal Expression: Impaired Initiation: No impairment Automatic Speech: Name;Social Response Level of Generative/Spontaneous Verbalization: Conversation Repetition:  No impairment Naming: Impairment Confrontation: Within functional limits(4/4) Divergent: 75-100% accurate(80%; 11 fruits in 60 seconds (13+ WNL)) Other Naming Comments: wordfinding issues x2 in mod complex conversation Pragmatics: No impairment Effective Techniques: Open ended questions;Other (Comment)(extended time) Non-Verbal Means of Communication: Not applicable Written Expression Dominant Hand: Right Written Expression: Not tested   Oral / Motor  Oral Motor/Sensory Function Overall Oral Motor/Sensory Function: Within functional limits Motor Speech Overall Motor Speech: Appears within functional limits for tasks assessed   [place] 01/20/2018, 11:08 AM  Deneise Lever, [place], [place] Speech-Language Pathologist [phone]

## 2018-01-20 NOTE — Evaluation (Signed)
Physical Therapy Evaluation Patient Details Name: Mark Foster MRN: [id number] DOB: August 28, 1956 Today's Date: 01/20/2018   History of Present Illness  62 y.o. male with history of a previous stroke, hypothyroidism, cervical spinal stenosis, and diabetes mellitus, presenting with right arm weakness. The patient received IV TPA Saturday 01/19/2018. Imaging showing Chronic left posterior MCA infarct and Abnormal flow void within the left ICA, consistent with occlusion per report.  Clinical Impression  Orders received for PT evaluation. Patient demonstrates deficits in functional mobility as indicated below. Will benefit from continued skilled PT to address deficits and maximize function. Will see as indicated and progress as tolerated.  Prior to admission patient with some modest baseline balance deficits, he was using cane for mobility in RUE. Now patient presents with further functional decline with mobility requiring moderate to at times max assist for basic level tasks. At this time, feel patient will need comprehensive therapies to maximize functional recovery and ability to prior level of function. Will recommend CIR consult.    Follow Up Recommendations CIR;Supervision/Assistance - 24 hour    Equipment Recommendations  (TBD)    Recommendations for Other Services Rehab consult     Precautions / Restrictions Precautions Precautions: Fall Restrictions Weight Bearing Restrictions: No      Mobility  Bed Mobility Overal bed mobility: Needs Assistance Bed Mobility: Supine to Sit     Supine to sit: Min guard     General bed mobility comments: min guard for safety, increased time to perform. No physical assist required. Noted inital right list prior to coming to midline  Transfers Overall transfer level: Needs assistance Equipment used: None Transfers: Sit to/from Stand Sit to Stand: Min assist         General transfer comment: min assist for stability, noted right and  posterior instability  Ambulation/Gait Ambulation/Gait assistance: Mod assist(Max without device) Ambulation Distance (Feet): (70 ft with RW, 40 ft without device) Assistive device: Rolling walker (2 wheeled);1 person hand held assist Gait Pattern/deviations: Step-through pattern;Decreased stride length;Staggering left;Staggering right;Narrow base of support Gait velocity: decreased Gait velocity interpretation: <1.31 ft/sec, indicative of household ambulator General Gait Details: patient with significant instbaility during ambulation. Ambulated with and without device. Min assist/min guard with RW short distance without task change. Attempted head turns and gait speed changes with RW and patient with several overt LOB requiring increased physical assist to maintain upright. When attempting HHA patient extremely unsteady and almost unable to perform without moderate to at time max assist.   Stairs            Wheelchair Mobility    Modified Rankin (Stroke Patients Only) Modified Rankin (Stroke Patients Only) Pre-Morbid Rankin Score: Moderate disability Modified Rankin: Moderately severe disability     Balance Overall balance assessment: Needs assistance Sitting-balance support: Feet supported Sitting balance-Leahy Scale: Fair   Postural control: Posterior lean Standing balance support: During functional activity;Single extremity supported Standing balance-Leahy Scale: Poor               High level balance activites: Direction changes;Head turns High Level Balance Comments: Moderate assist for higher level tasks             Pertinent Vitals/Pain Pain Assessment: 0-10 Pain Score: 2  Pain Location: head Pain Descriptors / Indicators: Headache Pain Intervention(s): Monitored during session    Home Living Family/patient expects to be discharged to:: Private residence Living Arrangements: Alone Available Help at Discharge: Family;Available PRN/intermittently Type  of Home: House Home Access: Stairs to enter Entrance Stairs-Rails: Can  reach both Entrance Stairs-Number of Steps: 3 Home Layout: One level Home Equipment: Cane - quad;Cane - single point;Walker - 2 wheels      Prior Function Level of Independence: Independent with assistive device(s)         Comments: uses a 4 point cane for mobility, at times can ambulate in home without     Hand Dominance   Dominant Hand: Right    Extremity/Trunk Assessment   Upper Extremity Assessment Upper Extremity Assessment: Defer to OT evaluation    Lower Extremity Assessment Lower Extremity Assessment: RLE deficits/detail;LLE deficits/detail;Generalized weakness RLE Deficits / Details: Some noted assymetry in RLE upon testing compared to left. Grossly unable to sustain contraction against resistance 3-/5 large muscle groups, RLE Sensation: (intact) RLE Coordination: decreased gross motor LLE Deficits / Details: noted generalized weakness, grossly 4-/5 LLE Sensation: (intact)       Communication      Cognition Arousal/Alertness: Awake/alert Behavior During Therapy: WFL for tasks assessed/performed Overall Cognitive Status: No family/caregiver present to determine baseline cognitive functioning Area of Impairment: Attention;Memory;Following commands;Problem solving                   Current Attention Level: Alternating Memory: Decreased short-term memory Following Commands: Follows one step commands consistently;Follows multi-step commands consistently;Follows multi-step commands with increased time     Problem Solving: Requires verbal cues        General Comments      Exercises     Assessment/Plan    PT Assessment Patient needs continued PT services  PT Problem List Decreased range of motion;Decreased activity tolerance;Decreased balance;Decreased mobility;Decreased strength;Decreased safety awareness;Impaired sensation;Decreased coordination       PT Treatment  Interventions DME instruction;Gait training;Stair training;Functional mobility training;Therapeutic activities;Therapeutic exercise;Balance training;Neuromuscular re-education;Patient/family education    PT Goals (Current goals can be found in the Care Plan section)  Acute Rehab PT Goals Patient Stated Goal: to go home PT Goal Formulation: With patient Time For Goal Achievement: 02/03/18 Potential to Achieve Goals: Good    Frequency Min 4X/week   Barriers to discharge        Co-evaluation               AM-PAC PT "6 Clicks" Daily Activity  Outcome Measure Difficulty turning over in bed (including adjusting bedclothes, sheets and blankets)?: A Little Difficulty moving from lying on back to sitting on the side of the bed? : A Little Difficulty sitting down on and standing up from a chair with arms (e.g., wheelchair, bedside commode, etc,.)?: Unable Help needed moving to and from a bed to chair (including a wheelchair)?: A Lot Help needed walking in hospital room?: A Lot Help needed climbing 3-5 steps with a railing? : A Lot 6 Click Score: 13    End of Session Equipment Utilized During Treatment: Gait belt Activity Tolerance: Patient tolerated treatment well Patient left: in chair;with call bell/phone within reach;with chair alarm set Nurse Communication: Mobility status PT Visit Diagnosis: Unsteadiness on feet (R26.81);Difficulty in walking, not elsewhere classified (R26.2);Other symptoms and signs involving the nervous system (R29.898)    Time: 8828-0034 PT Time Calculation (min) (ACUTE ONLY): 25 min   Charges:   PT Evaluation $PT Eval Moderate Complexity: 1 Mod PT Treatments $Gait Training: 8-22 mins   PT G Codes:        Alben Deeds, PT DPT  Board Certified Neurologic Specialist [place] 01/20/2018, 1:58 PM

## 2018-01-20 NOTE — Progress Notes (Signed)
OT Cancellation Note  Patient Details Name: Mark Foster MRN: [id number] DOB: 1956-03-18   Cancelled Treatment:    Reason Eval/Treat Not Completed: Patient not medically ready.  Pt with active bedrest order.  Will reatttempt.  [place], OTR/L [phone]   Lucille Passy M 01/20/2018, 8:34 AM

## 2018-01-20 NOTE — Progress Notes (Signed)
PT Cancellation Note  Patient Details Name: Mark Foster MRN: [id number] DOB: Nov 05, 1955   Cancelled Treatment:    Reason Eval/Treat Not Completed: Patient not medically ready;Active bedrest order   Duncan Dull 01/20/2018, 7:04 AM

## 2018-01-20 NOTE — Consult Note (Signed)
Chief Complaint: Patient was seen in consultation today for diagnostic cerebral arteriogram Chief Complaint  Patient presents with  . Code Stroke    Referring Physician(s): Xu,J  Supervising Physician: Luanne Bras  Patient Status: [facility] - In-pt  History of Present Illness: Mark Foster is a 62 y.o. male with past medical history of cervical stenosis, prior stroke 2011, diabetes presented to [facility] on 5/18 with right arm weakness.  CT head revealed no acute intracranial process but old small left parietal/MCA territory infarct . He received IV TPA.  Subsequent MRI/MRA head and neck revealed : 1. No acute intracranial abnormality identified. 2. Chronic left posterior MCA infarct. 3. Abnormal flow void within the left ICA, consistent with occlusion. See below.  MRA HEAD IMPRESSION  1. 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 Left MCA is perfused via collateral flow cross the circle-of-Willis as well as from the posterior circulation, although flow within the left MCA overall somewhat attenuated as compared to the right. 2. Moderate tandem stenoses involving the proximal and mid cavernous right ICA. 3. Focal severe proximal right M1 stenosis. 4. Focal severe mid left A1 stenosis. 5. Fetal type origin of the PCAs with overall diminutive vertebrobasilar system. Moderate tandem stenoses involving the mid and distal basilar artery.  MRA NECK IMPRESSION  1. 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 2. Right carotid artery system widely patent within the neck without stenosis. 3. Suspected at least moderate stenoses at the origin of the vertebral arteries bilaterally. Vertebral arteries otherwise patent within the neck without stenosis or occlusion  Request now received from neurology for diagnostic cerebral arteriogram for further evaluation.  Past Medical History:  Diagnosis Date  . Cervical compression fracture ([place])   .  Cervical spinal stenosis   . Stroke [facility])    2011  . Type 2 diabetes mellitus ([place])     History reviewed. No pertinent surgical history.  Allergies: Demerol [meperidine hcl]  Medications: Prior to Admission medications   Medication Sig Start Date End Date Taking? Authorizing Provider  aspirin EC 81 MG tablet Take 81 mg by mouth daily.   Yes [provider]  glipiZIDE (GLUCOTROL) 10 MG tablet Take 10 mg by mouth 2 (two) times daily. 01/15/18  Yes [provider]  JANUVIA 100 MG tablet Take 100 mg by mouth daily. 01/11/18  Yes [provider]  lisinopril (PRINIVIL,ZESTRIL) 2.5 MG tablet Take 2.5 mg by mouth daily.   Yes [provider]  metFORMIN (GLUCOPHAGE) 1000 MG tablet Take 1,000 mg by mouth 2 (two) times daily with a meal.   Yes [provider]  NP THYROID 30 MG tablet Take 30 mg by mouth daily. 01/17/18  Yes [provider]  pioglitazone (ACTOS) 45 MG tablet Take 45 mg by mouth daily.   Yes [provider]  sertraline (ZOLOFT) 50 MG tablet Take 50 mg by mouth daily.   Yes [provider]  vitamin B-12 (CYANOCOBALAMIN) 500 MCG tablet Take 500 mcg by mouth daily.   Yes [provider]     No family history on file.  Social History   Socioeconomic History  . Marital status: Single    Spouse name: Not on file  . Number of children: Not on file  . Years of education: Not on file  . Highest education level: Not on file  Occupational History  . Not on file  Social Needs  . Financial resource strain: Not on file  .  Food insecurity:    Worry: Not on file    Inability: Not on file  . Transportation needs:    Medical: Not on file    Non-medical: Not on file  Tobacco Use  . Smoking status: Never Smoker  . Smokeless tobacco: Never Used  Substance and Sexual Activity  . Alcohol use: Yes  . Drug use: Never  . Sexual activity: Not on file  Lifestyle  . Physical activity:    Days per week: Not on  file    Minutes per session: Not on file  . Stress: Not on file  Relationships  . Social connections:    Talks on phone: Not on file    Gets together: Not on file    Attends religious service: Not on file    Active member of club or organization: Not on file    Attends meetings of clubs or organizations: Not on file    Relationship status: Not on file  Other Topics Concern  . Not on file  Social History Narrative  . Not on file      Review of Systems currently denies fever, headache, chest pain, dyspnea, cough, abdominal/back pain, nausea, vomiting, bleeding, visual problems; he does have some mild right lower extremity weakness as well  Vital Signs: BP 115/73   Pulse 72   Temp 98.3 F (36.8 C) (Oral)   Resp (!) 22   Ht 5\' 11"  (1.803 m)   Wt 156 lb (70.8 kg)   SpO2 99%   BMI 21.76 kg/m   Physical Exam awake, alert.  Chest clear to auscultation bilaterally.  Heart with regular rate and rhythm.  Abdomen soft, positive bowel sounds, nontender.  No lower extremity edema.  Speech normal, tongue midline, face symmetrical, extraocular movements intact, strength 4/5 in all extremities except right wrist extension 3/5 and finger grip 3/5.  Minimal right lower extremity weakness; some diminished sensation right hand  Imaging: [name] Neck W Wo Contrast  Result Date: 01/20/2018 CLINICAL DATA:  Initial evaluation for acute right arm weakness. History of prior stroke. EXAM: MRI HEAD WITHOUT CONTRAST MRA HEAD WITHOUT CONTRAST MRA NECK WITHOUT AND WITH CONTRAST TECHNIQUE: Multiplanar, multiecho pulse sequences of the brain and surrounding structures were obtained without intravenous contrast. Angiographic images of the Circle of Willis were obtained using MRA technique without intravenous contrast. Angiographic images of the neck were obtained using MRA technique without and with intravenous contrast. Carotid stenosis measurements (when applicable) are obtained utilizing NASCET criteria, using  the distal internal carotid diameter as the denominator. CONTRAST:  MultiHance. COMPARISON:  Comparison made with prior CT from 01/19/2018. FINDINGS: MRI HEAD FINDINGS Age-related cerebral volume loss. No significant cerebral white matter change for age. Focal area of encephalomalacia with gliosis within the posterior left frontal lobe compatible with remote left MCA territory infarct. Small amount of chronic hemosiderin staining within this region. No abnormal foci of restricted diffusion to suggest acute or subacute ischemia. Gray-white matter differentiation otherwise maintained. No other areas of chronic infarction. No evidence for acute intracranial hemorrhage. No mass lesion, midline shift or mass effect. No hydrocephalus. No extra-axial fluid collection. Major dural sinuses are grossly patent. Pituitary gland suprasellar region normal. Midline structures intact and normal. Abnormal flow void within the left ICA to the level of the terminus, likely occluded. Major intracranial vascular flow voids otherwise maintained. Diminutive vertebrobasilar system. Craniocervical junction normal. Upper cervical spine within normal limits. Bone marrow signal intensity normal. No scalp soft tissue abnormality. Globes and orbital soft tissues  within normal limits. Scattered mucosal thickening within the ethmoidal air cells. Right maxillary sinus retention cyst. Paranasal sinuses are otherwise clear. Trace opacity bilateral mastoid air cells, of doubtful significance. Inner ear structures normal. MRA HEAD FINDINGS ANTERIOR CIRCULATION: Left ICA occluded to the level of the terminus. Distal cervical right ICA widely patent as is the petrous right ICA. Scattered atheromatous irregularity within the cavernous/supraclinoid ICA. Tandem moderate stenoses at the proximal aspect and anterior genu of the cavernous right ICA (series 9001, image 71, 83). Supraclinoid right ICA otherwise patent to the terminus. Short-segment severe  proximal right M1 stenosis (series 9001, image 102). Right M1 bifurcates early. Atheromatous irregularity within proximal right M2 branches without high-grade stenosis. Right MCA branches well perfused distally. Right A1 patent without stenosis. Normal anterior communicating artery. Anterior cerebral arteries well perfused to their distal aspects without high-grade stenosis. Hypoplastic left A1 with superimposed severe stenosis (series 1033001, image 9). Left ICA terminus is patent via flow across the circle-of-Willis and from the posterior circulation. Left MCA is perfused with somewhat attenuated as compared to the right. Left M1 segment patent without stenosis. No proximal left M2 occlusion. Distal left MCA branches well perfused. POSTERIOR CIRCULATION: Vertebral artery somewhat diminutive bilaterally with mild multifocal irregularity without high-grade stenosis. Partially visualized posterior inferior cerebral arteries patent bilaterally. Basilar artery diffusely diminutive with tandem moderate stenoses involving the mid and distal basilar. Superior cerebral arteries patent bilaterally. Fetal type origin of the PCAs supplied via posterior communicating arteries. PCAs demonstrate scattered atheromatous irregularity without high-grade stenosis, and are perfused to their distal aspects. No aneurysm. MRA NECK FINDINGS Source images reviewed. Visualized aortic arch of normal caliber with normal branch pattern. No flow-limiting stenosis about the origin of the great vessels. Visualized subclavian arteries widely patent. Right common carotid artery patent from its origin to the bifurcation. No significant atheromatous stenosis about the right bifurcation. Right ICA patent from the bifurcation to the skull base without stenosis or occlusion. Left common carotid artery patent from its origin to the bifurcation. There is occlusion of the left ICA at the level of the bifurcation, which remains occluded within the neck.  Both of the vertebral arteries arise from the subclavian arteries. Right vertebral artery dominant. Suspected at least moderate stenoses at the origin of the vertebral arteries bilaterally. Vertebral arteries otherwise patent within the neck without high-grade stenosis or occlusion. IMPRESSION: MRI HEAD IMPRESSION 1. No acute intracranial abnormality identified. 2. Chronic left posterior MCA infarct. 3. Abnormal flow void within the left ICA, consistent with occlusion. See below. MRA HEAD IMPRESSION 1. 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 Left MCA is perfused via collateral flow cross the circle-of-Willis as well as from the posterior circulation, although flow within the left MCA overall somewhat attenuated as compared to the right. 2. Moderate tandem stenoses involving the proximal and mid cavernous right ICA. 3. Focal severe proximal right M1 stenosis. 4. Focal severe mid left A1 stenosis. 5. Fetal type origin of the PCAs with overall diminutive vertebrobasilar system. Moderate tandem stenoses involving the mid and distal basilar artery. MRA NECK IMPRESSION 1. 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 2. Right carotid artery system widely patent within the neck without stenosis. 3. Suspected at least moderate stenoses at the origin of the vertebral arteries bilaterally. Vertebral arteries otherwise patent within the neck without stenosis or occlusion. Electronically Signed   By: Jeannine Boga M.D.   On: 01/20/2018 06:19   Mr Brain Wo Contrast  Result Date:  01/20/2018 CLINICAL DATA:  Initial evaluation for acute right arm weakness. History of prior stroke. EXAM: MRI HEAD WITHOUT CONTRAST MRA HEAD WITHOUT CONTRAST MRA NECK WITHOUT AND WITH CONTRAST TECHNIQUE: Multiplanar, multiecho pulse sequences of the brain and surrounding structures were obtained without intravenous contrast. Angiographic images of the Circle of Willis were obtained using MRA technique without intravenous  contrast. Angiographic images of the neck were obtained using MRA technique without and with intravenous contrast. Carotid stenosis measurements (when applicable) are obtained utilizing NASCET criteria, using the distal internal carotid diameter as the denominator. CONTRAST:  MultiHance. COMPARISON:  Comparison made with prior CT from 01/19/2018. FINDINGS: MRI HEAD FINDINGS Age-related cerebral volume loss. No significant cerebral white matter change for age. Focal area of encephalomalacia with gliosis within the posterior left frontal lobe compatible with remote left MCA territory infarct. Small amount of chronic hemosiderin staining within this region. No abnormal foci of restricted diffusion to suggest acute or subacute ischemia. Gray-white matter differentiation otherwise maintained. No other areas of chronic infarction. No evidence for acute intracranial hemorrhage. No mass lesion, midline shift or mass effect. No hydrocephalus. No extra-axial fluid collection. Major dural sinuses are grossly patent. Pituitary gland suprasellar region normal. Midline structures intact and normal. Abnormal flow void within the left ICA to the level of the terminus, likely occluded. Major intracranial vascular flow voids otherwise maintained. Diminutive vertebrobasilar system. Craniocervical junction normal. Upper cervical spine within normal limits. Bone marrow signal intensity normal. No scalp soft tissue abnormality. Globes and orbital soft tissues within normal limits. Scattered mucosal thickening within the ethmoidal air cells. Right maxillary sinus retention cyst. Paranasal sinuses are otherwise clear. Trace opacity bilateral mastoid air cells, of doubtful significance. Inner ear structures normal. MRA HEAD FINDINGS ANTERIOR CIRCULATION: Left ICA occluded to the level of the terminus. Distal cervical right ICA widely patent as is the petrous right ICA. Scattered atheromatous irregularity within the cavernous/supraclinoid  ICA. Tandem moderate stenoses at the proximal aspect and anterior genu of the cavernous right ICA (series 9001, image 71, 83). Supraclinoid right ICA otherwise patent to the terminus. Short-segment severe proximal right M1 stenosis (series 9001, image 102). Right M1 bifurcates early. Atheromatous irregularity within proximal right M2 branches without high-grade stenosis. Right MCA branches well perfused distally. Right A1 patent without stenosis. Normal anterior communicating artery. Anterior cerebral arteries well perfused to their distal aspects without high-grade stenosis. Hypoplastic left A1 with superimposed severe stenosis (series 1033001, image 9). Left ICA terminus is patent via flow across the circle-of-Willis and from the posterior circulation. Left MCA is perfused with somewhat attenuated as compared to the right. Left M1 segment patent without stenosis. No proximal left M2 occlusion. Distal left MCA branches well perfused. POSTERIOR CIRCULATION: Vertebral artery somewhat diminutive bilaterally with mild multifocal irregularity without high-grade stenosis. Partially visualized posterior inferior cerebral arteries patent bilaterally. Basilar artery diffusely diminutive with tandem moderate stenoses involving the mid and distal basilar. Superior cerebral arteries patent bilaterally. Fetal type origin of the PCAs supplied via posterior communicating arteries. PCAs demonstrate scattered atheromatous irregularity without high-grade stenosis, and are perfused to their distal aspects. No aneurysm. MRA NECK FINDINGS Source images reviewed. Visualized aortic arch of normal caliber with normal branch pattern. No flow-limiting stenosis about the origin of the great vessels. Visualized subclavian arteries widely patent. Right common carotid artery patent from its origin to the bifurcation. No significant atheromatous stenosis about the right bifurcation. Right ICA patent from the bifurcation to the skull base without  stenosis or occlusion. Left common carotid artery  patent from its origin to the bifurcation. There is occlusion of the left ICA at the level of the bifurcation, which remains occluded within the neck. Both of the vertebral arteries arise from the subclavian arteries. Right vertebral artery dominant. Suspected at least moderate stenoses at the origin of the vertebral arteries bilaterally. Vertebral arteries otherwise patent within the neck without high-grade stenosis or occlusion. IMPRESSION: MRI HEAD IMPRESSION 1. No acute intracranial abnormality identified. 2. Chronic left posterior MCA infarct. 3. Abnormal flow void within the left ICA, consistent with occlusion. See below. MRA HEAD IMPRESSION 1. 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 Left MCA is perfused via collateral flow cross the circle-of-Willis as well as from the posterior circulation, although flow within the left MCA overall somewhat attenuated as compared to the right. 2. Moderate tandem stenoses involving the proximal and mid cavernous right ICA. 3. Focal severe proximal right M1 stenosis. 4. Focal severe mid left A1 stenosis. 5. Fetal type origin of the PCAs with overall diminutive vertebrobasilar system. Moderate tandem stenoses involving the mid and distal basilar artery. MRA NECK IMPRESSION 1. 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 2. Right carotid artery system widely patent within the neck without stenosis. 3. Suspected at least moderate stenoses at the origin of the vertebral arteries bilaterally. Vertebral arteries otherwise patent within the neck without stenosis or occlusion. Electronically Signed   By: Jeannine Boga M.D.   On: 01/20/2018 06:19   Mr Cervical Spine Wo Contrast  Result Date: 01/20/2018 CLINICAL DATA:  Initial evaluation for cervical stenosis, right arm weakness. EXAM: MRI CERVICAL SPINE WITHOUT CONTRAST TECHNIQUE: Multiplanar, multisequence MR imaging of the cervical spine was performed.  No intravenous contrast was administered. COMPARISON:  None available. FINDINGS: Alignment: Straightening of the normal cervical lordosis. No listhesis. Vertebrae: Vertebral body heights maintained without evidence for acute or chronic fracture. Bone marrow signal intensity within normal limits. Small benign hemangioma noted within the C5 vertebral body. No other discrete or worrisome osseous lesions. Mild reactive endplate changes present about the C6-7 interspace. Cord: Subtle patchy T2 signal abnormality within the left aspect of the cervical spinal cord at the level of C5-6, suspicious for chronic myelomalacia related to disc bulge (series 8001, image 28). Signal intensity within the cervical spinal cord otherwise within normal limits. Posterior Fossa, vertebral arteries, paraspinal tissues: Visualized brain and posterior fossa within normal limits. Craniocervical junction normal. Paraspinous and prevertebral soft tissues normal. Normal intravascular flow voids present within the vertebral arteries bilaterally. Disc levels: C2-C3: Minimal uncovertebral hypertrophy without significant stenosis. C3-C4: Mild left greater than right uncovertebral hypertrophy. No significant disc bulge. Mild left C4 foraminal narrowing. No significant right foraminal encroachment. C4-C5: Chronic diffuse degenerative disc osteophyte with intervertebral disc space narrowing. Broad posterior component flattens the ventral CSF. Superimposed ligamentum flavum thickening. Moderate spinal stenosis with mild cord flattening. Fairly severe bilateral C5 foraminal stenosis, left worse than right. C5-C6: Chronic diffuse degenerative disc osteophyte with intervertebral disc space narrowing. Broad posterior component flattens the ventral CSF, greater on the left. Flattening of the cervical spinal cord, also greater on the left, with subtle cord signal abnormality. Moderate to severe spinal stenosis. Severe bilateral C6 foraminal narrowing. C6-C7:  Chronic diffuse degenerative disc osteophyte with intervertebral disc space narrowing and reactive endplate changes. Flattening of the ventral CSF with resultant moderate spinal stenosis. Moderate bilateral C7 foraminal narrowing, slightly worse on the right. C7-T1: Bilateral facet degeneration, greater on the right. No significant spinal stenosis. Mild right C8 foraminal  stenosis. Visualized upper thoracic spine within normal limits. IMPRESSION: 1. Multilevel cervical spondylolysis with resultant moderate to severe diffuse spinal stenosis at C4-5 through C6-7, most severe at C5-6. 2. Subtle T2 signal abnormality within the left aspect of the cervical spinal cord at C5-6, suspicious for myelomalacia related to disc disease and stenosis. 3. Multifactorial degenerative changes with resultant multilevel foraminal narrowing as above. Notable findings include severe bilateral C5 and C6 foraminal narrowing with moderate bilateral C7 foraminal stenosis. Electronically Signed   By: Jeannine Boga M.D.   On: 01/20/2018 06:29   [name] Head Wo Contrast  Result Date: 01/20/2018 CLINICAL DATA:  Initial evaluation for acute right arm weakness. History of prior stroke. EXAM: MRI HEAD WITHOUT CONTRAST MRA HEAD WITHOUT CONTRAST MRA NECK WITHOUT AND WITH CONTRAST TECHNIQUE: Multiplanar, multiecho pulse sequences of the brain and surrounding structures were obtained without intravenous contrast. Angiographic images of the Circle of Willis were obtained using MRA technique without intravenous contrast. Angiographic images of the neck were obtained using MRA technique without and with intravenous contrast. Carotid stenosis measurements (when applicable) are obtained utilizing NASCET criteria, using the distal internal carotid diameter as the denominator. CONTRAST:  MultiHance. COMPARISON:  Comparison made with prior CT from 01/19/2018. FINDINGS: MRI HEAD FINDINGS Age-related cerebral volume loss. No significant cerebral white  matter change for age. Focal area of encephalomalacia with gliosis within the posterior left frontal lobe compatible with remote left MCA territory infarct. Small amount of chronic hemosiderin staining within this region. No abnormal foci of restricted diffusion to suggest acute or subacute ischemia. Gray-white matter differentiation otherwise maintained. No other areas of chronic infarction. No evidence for acute intracranial hemorrhage. No mass lesion, midline shift or mass effect. No hydrocephalus. No extra-axial fluid collection. Major dural sinuses are grossly patent. Pituitary gland suprasellar region normal. Midline structures intact and normal. Abnormal flow void within the left ICA to the level of the terminus, likely occluded. Major intracranial vascular flow voids otherwise maintained. Diminutive vertebrobasilar system. Craniocervical junction normal. Upper cervical spine within normal limits. Bone marrow signal intensity normal. No scalp soft tissue abnormality. Globes and orbital soft tissues within normal limits. Scattered mucosal thickening within the ethmoidal air cells. Right maxillary sinus retention cyst. Paranasal sinuses are otherwise clear. Trace opacity bilateral mastoid air cells, of doubtful significance. Inner ear structures normal. MRA HEAD FINDINGS ANTERIOR CIRCULATION: Left ICA occluded to the level of the terminus. Distal cervical right ICA widely patent as is the petrous right ICA. Scattered atheromatous irregularity within the cavernous/supraclinoid ICA. Tandem moderate stenoses at the proximal aspect and anterior genu of the cavernous right ICA (series 9001, image 71, 83). Supraclinoid right ICA otherwise patent to the terminus. Short-segment severe proximal right M1 stenosis (series 9001, image 102). Right M1 bifurcates early. Atheromatous irregularity within proximal right M2 branches without high-grade stenosis. Right MCA branches well perfused distally. Right A1 patent without  stenosis. Normal anterior communicating artery. Anterior cerebral arteries well perfused to their distal aspects without high-grade stenosis. Hypoplastic left A1 with superimposed severe stenosis (series 1033001, image 9). Left ICA terminus is patent via flow across the circle-of-Willis and from the posterior circulation. Left MCA is perfused with somewhat attenuated as compared to the right. Left M1 segment patent without stenosis. No proximal left M2 occlusion. Distal left MCA branches well perfused. POSTERIOR CIRCULATION: Vertebral artery somewhat diminutive bilaterally with mild multifocal irregularity without high-grade stenosis. Partially visualized posterior inferior cerebral arteries patent bilaterally. Basilar artery diffusely diminutive with tandem moderate stenoses involving the mid  and distal basilar. Superior cerebral arteries patent bilaterally. Fetal type origin of the PCAs supplied via posterior communicating arteries. PCAs demonstrate scattered atheromatous irregularity without high-grade stenosis, and are perfused to their distal aspects. No aneurysm. MRA NECK FINDINGS Source images reviewed. Visualized aortic arch of normal caliber with normal branch pattern. No flow-limiting stenosis about the origin of the great vessels. Visualized subclavian arteries widely patent. Right common carotid artery patent from its origin to the bifurcation. No significant atheromatous stenosis about the right bifurcation. Right ICA patent from the bifurcation to the skull base without stenosis or occlusion. Left common carotid artery patent from its origin to the bifurcation. There is occlusion of the left ICA at the level of the bifurcation, which remains occluded within the neck. Both of the vertebral arteries arise from the subclavian arteries. Right vertebral artery dominant. Suspected at least moderate stenoses at the origin of the vertebral arteries bilaterally. Vertebral arteries otherwise patent within the  neck without high-grade stenosis or occlusion. IMPRESSION: MRI HEAD IMPRESSION 1. No acute intracranial abnormality identified. 2. Chronic left posterior MCA infarct. 3. Abnormal flow void within the left ICA, consistent with occlusion. See below. MRA HEAD IMPRESSION 1. 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 Left MCA is perfused via collateral flow cross the circle-of-Willis as well as from the posterior circulation, although flow within the left MCA overall somewhat attenuated as compared to the right. 2. Moderate tandem stenoses involving the proximal and mid cavernous right ICA. 3. Focal severe proximal right M1 stenosis. 4. Focal severe mid left A1 stenosis. 5. Fetal type origin of the PCAs with overall diminutive vertebrobasilar system. Moderate tandem stenoses involving the mid and distal basilar artery. MRA NECK IMPRESSION 1. 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 2. Right carotid artery system widely patent within the neck without stenosis. 3. Suspected at least moderate stenoses at the origin of the vertebral arteries bilaterally. Vertebral arteries otherwise patent within the neck without stenosis or occlusion. Electronically Signed   By: Jeannine Boga M.D.   On: 01/20/2018 06:19   Ct Head Code Stroke Wo Contrast  Result Date: 01/19/2018 CLINICAL DATA:  Code stroke. RIGHT hand numbness and decreased sensation since this afternoon. EXAM: CT HEAD WITHOUT CONTRAST TECHNIQUE: Contiguous axial images were obtained from the base of the skull through the vertex without intravenous contrast. COMPARISON:  None. FINDINGS: BRAIN: No intraparenchymal hemorrhage, mass effect nor midline shift. LEFT anterior parietal lobe encephalomalacia. Mild ex vacuo dilatation LEFT lateral ventricle, borderline parenchymal brain volume loss for age. No hydrocephalus. No acute large vascular territory infarct. No abnormal extra-axial fluid collections. Symmetric basal ganglia mineralization. The  basal cisterns are patent. VASCULAR: Moderate to severe calcific atherosclerosis of the carotid siphons. SKULL: No skull fracture. No significant scalp soft tissue swelling. SINUSES/ORBITS: The mastoid air-cells and included paranasal sinuses are well-aerated.The included ocular globes and orbital contents are non-suspicious. OTHER: Patient is edentulous. ASPECTS [facility] Stroke Program Early CT Score) - Ganglionic level infarction (caudate, lentiform nuclei, internal capsule, insula, M1-M3 cortex): 7 - Supraganglionic infarction (M4-M6 cortex): 3 Total score (0-10 with 10 being normal): 10 IMPRESSION: 1. No acute intracranial process. 2. ASPECTS is 10. 3. Old small LEFT parietal/MCA territory infarct. 4. Critical Value/emergent results were called by telephone at the time of interpretation on 01/19/2018 at 6:05 pm to Dr. Francine Graven , who verbally acknowledged these results. Electronically Signed   By: Elon Alas M.D.   On: 01/19/2018 18:07    Labs:  CBC: Recent Labs    01/19/18 1802 01/19/18 1811 01/20/18 0247  WBC 2.5*  --  2.7*  HGB 13.4 13.3 12.6*  HCT 37.7* 39.0 35.5*  PLT 87*  --  88*    COAGS: Recent Labs    01/19/18 1802  INR 1.08  APTT 27    BMP: Recent Labs    01/19/18 1802 01/19/18 1811  NA 133* 138  K 3.6 3.6  CL 100* 101  CO2 26  --   GLUCOSE 233* 226*  BUN 20 20  CALCIUM 9.1  --   CREATININE 0.69 0.70  GFRNONAA >60  --   GFRAA >60  --     LIVER FUNCTION TESTS: Recent Labs    01/19/18 1802  BILITOT 1.4*  AST 17  ALT 17  ALKPHOS 49  PROT 7.0  ALBUMIN 4.1    TUMOR MARKERS: No results for input(s): AFPTM, CEA, CA199, CHROMGRNA in the last 8760 hours.  Assessment and Plan: 62 y.o. male with past medical history of cervical stenosis, prior stroke 2011, diabetes presented to [facility] on 5/18 with right arm weakness.  CT head revealed no acute intracranial process but old small left parietal/MCA territory infarct . He received IV  TPA.  Subsequent MRI/MRA head and neck revealed : 1. No acute intracranial abnormality identified. 2. Chronic left posterior MCA infarct. 3. Abnormal flow void within the left ICA, consistent with occlusion. See below.  MRA HEAD IMPRESSION  1. 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 Left MCA is perfused via collateral flow cross the circle-of-Willis as well as from the posterior circulation, although flow within the left MCA overall somewhat attenuated as compared to the right. 2. Moderate tandem stenoses involving the proximal and mid cavernous right ICA. 3. Focal severe proximal right M1 stenosis. 4. Focal severe mid left A1 stenosis. 5. Fetal type origin of the PCAs with overall diminutive vertebrobasilar system. Moderate tandem stenoses involving the mid and distal basilar artery.  MRA NECK IMPRESSION  1. 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 2. Right carotid artery system widely patent within the neck without stenosis. 3. Suspected at least moderate stenoses at the origin of the vertebral arteries bilaterally. Vertebral arteries otherwise patent within the neck without stenosis or occlusion  Request now received from neurology for diagnostic cerebral arteriogram for further evaluation.  Current labs include WBC 2.7, hemoglobin 12.6 platelets 88k, creatinine 0.69, pro time 13.9, INR 1.08 ,urine drug screen + for THC.Risks and benefits of procedure were discussed with the patient /spouse including, but not limited to bleeding, infection, vascular injury, stroke or contrast induced renal failure.  This interventional procedure involves the use of X-rays and because of the nature of the planned procedure, it is possible that we will have prolonged use of X-ray fluoroscopy.  Potential radiation risks to you include (but are not limited to) the following: - A slightly elevated risk for cancer  several years later in life. This risk is typically  less than 0.5% percent. This risk is low in comparison to the normal incidence of human cancer, which is 33% for women and 50% for men according to the [place]. - Radiation induced injury can include skin redness, resembling a rash, tissue breakdown / ulcers and hair loss (which can be temporary or permanent).   The likelihood of either of these occurring depends on the difficulty of the procedure and whether you are sensitive to radiation due to previous procedures, disease,  or genetic conditions.   IF your procedure requires a prolonged use of radiation, you will be notified and given written instructions for further action.  It is your responsibility to monitor the irradiated area for the 2 weeks following the procedure and to notify your physician if you are concerned that you have suffered a radiation induced injury.    All of the patient's questions were answered, patient is agreeable to proceed.  Consent signed and in chart.  Procedure scheduled for 5/20    Thank you for this interesting consult.  I greatly enjoyed meeting Mark Foster and look forward to participating in their care.  A copy of this report was sent to the requesting provider on this date.  Electronically Signed: D. Rowe Robert, PA-C 01/20/2018, 2:44 PM   I spent a total of 30 minutes   in face to face in clinical consultation, greater than 50% of which was counseling/coordinating care for diagnostic cerebral arteriogram

## 2018-01-21 ENCOUNTER — Inpatient Hospital Stay (HOSPITAL_COMMUNITY): Payer: BLUE CROSS/BLUE SHIELD

## 2018-01-21 DIAGNOSIS — G459 Transient cerebral ischemic attack, unspecified: Secondary | ICD-10-CM

## 2018-01-21 DIAGNOSIS — I10 Essential (primary) hypertension: Secondary | ICD-10-CM | POA: Diagnosis present

## 2018-01-21 DIAGNOSIS — M4802 Spinal stenosis, cervical region: Secondary | ICD-10-CM | POA: Diagnosis present

## 2018-01-21 DIAGNOSIS — I6522 Occlusion and stenosis of left carotid artery: Secondary | ICD-10-CM | POA: Diagnosis present

## 2018-01-21 DIAGNOSIS — D72819 Decreased white blood cell count, unspecified: Secondary | ICD-10-CM | POA: Diagnosis present

## 2018-01-21 DIAGNOSIS — Z8673 Personal history of transient ischemic attack (TIA), and cerebral infarction without residual deficits: Secondary | ICD-10-CM

## 2018-01-21 DIAGNOSIS — D696 Thrombocytopenia, unspecified: Secondary | ICD-10-CM | POA: Diagnosis present

## 2018-01-21 DIAGNOSIS — R200 Anesthesia of skin: Secondary | ICD-10-CM | POA: Diagnosis present

## 2018-01-21 DIAGNOSIS — E119 Type 2 diabetes mellitus without complications: Secondary | ICD-10-CM

## 2018-01-21 DIAGNOSIS — R29898 Other symptoms and signs involving the musculoskeletal system: Secondary | ICD-10-CM | POA: Diagnosis present

## 2018-01-21 DIAGNOSIS — E782 Mixed hyperlipidemia: Secondary | ICD-10-CM | POA: Diagnosis present

## 2018-01-21 HISTORY — PX: IR ANGIO INTRA EXTRACRAN SEL COM CAROTID INNOMINATE BILAT MOD SED: IMG5360

## 2018-01-21 HISTORY — PX: IR ANGIO VERTEBRAL SEL VERTEBRAL BILAT MOD SED: IMG5369

## 2018-01-21 LAB — CBC
HCT: 37.4 % — ABNORMAL LOW (ref 39.0–52.0)
Hemoglobin: 13.1 g/dL (ref 13.0–17.0)
MCH: 32.8 pg (ref 26.0–34.0)
MCHC: 35 g/dL (ref 30.0–36.0)
MCV: 93.7 fL (ref 78.0–100.0)
Platelets: 89 10*3/uL — ABNORMAL LOW (ref 150–400)
RBC: 3.99 MIL/uL — ABNORMAL LOW (ref 4.22–5.81)
RDW: 13.5 % (ref 11.5–15.5)
WBC: 2.7 10*3/uL — ABNORMAL LOW (ref 4.0–10.5)

## 2018-01-21 LAB — BASIC METABOLIC PANEL
Anion gap: 7 (ref 5–15)
BUN: 10 mg/dL (ref 6–20)
CHLORIDE: 107 mmol/L (ref 101–111)
CO2: 26 mmol/L (ref 22–32)
Calcium: 8.5 mg/dL — ABNORMAL LOW (ref 8.9–10.3)
Creatinine, Ser: 0.68 mg/dL (ref 0.61–1.24)
GFR calc non Af Amer: >60 mL/min (ref >60)
Glucose, Bld: 244 mg/dL — ABNORMAL HIGH (ref 65–99)
POTASSIUM: 4 mmol/L (ref 3.5–5.1)
SODIUM: 140 mmol/L (ref 135–145)

## 2018-01-21 LAB — GLUCOSE, CAPILLARY
GLUCOSE-CAPILLARY: 184 mg/dL — AB (ref 65–99)
GLUCOSE-CAPILLARY: 171 mg/dL — AB (ref 65–99)
Glucose-Capillary: 143 mg/dL — ABNORMAL HIGH (ref 65–99)
Glucose-Capillary: 217 mg/dL — ABNORMAL HIGH (ref 65–99)

## 2018-01-21 LAB — ECHOCARDIOGRAM COMPLETE
Height: 71 in
WEIGHTICAEL: 2496 [oz_av]

## 2018-01-21 LAB — PROTIME-INR
INR: 1.14
Prothrombin Time: 14.5 seconds (ref 11.4–15.2)

## 2018-01-21 MED ORDER — HEPARIN SODIUM (PORCINE) 1000 UNIT/ML IJ SOLN
INTRAMUSCULAR | Status: AC
  Filled 2018-01-21: qty 2

## 2018-01-21 MED ORDER — IOPAMIDOL (ISOVUE-300) INJECTION 61%
INTRAVENOUS | Status: DC
Start: 2018-01-21 — End: 2018-01-21
  Filled 2018-01-21: qty 50

## 2018-01-21 MED ORDER — MIDAZOLAM HCL 2 MG/2ML IJ SOLN
INTRAMUSCULAR | Status: AC
  Filled 2018-01-21: qty 2

## 2018-01-21 MED ORDER — FENTANYL CITRATE (PF) 100 MCG/2ML IJ SOLN
INTRAMUSCULAR | Status: AC | PRN
  Administered 2018-01-21: 25 ug via INTRAVENOUS

## 2018-01-21 MED ORDER — ASPIRIN EC 325 MG PO TBEC
325.0000 mg | DELAYED_RELEASE_TABLET | Freq: Every day | ORAL | Status: DC
  Administered 2018-01-21 – 2018-01-22 (×2): 325 mg via ORAL
  Filled 2018-01-21 (×2): qty 1

## 2018-01-21 MED ORDER — LIDOCAINE HCL (PF) 1 % IJ SOLN
INTRAMUSCULAR | Status: AC | PRN
  Administered 2018-01-21: 12 mL

## 2018-01-21 MED ORDER — MIDAZOLAM HCL 2 MG/2ML IJ SOLN
INTRAMUSCULAR | Status: AC | PRN
  Administered 2018-01-21: 1 mg via INTRAVENOUS

## 2018-01-21 MED ORDER — FENTANYL CITRATE (PF) 100 MCG/2ML IJ SOLN
INTRAMUSCULAR | Status: AC
  Filled 2018-01-21: qty 2

## 2018-01-21 MED ORDER — CLOPIDOGREL BISULFATE 75 MG PO TABS
75.0000 mg | ORAL_TABLET | Freq: Every day | ORAL | Status: DC
  Administered 2018-01-21 – 2018-01-22 (×2): 75 mg via ORAL
  Filled 2018-01-21 (×2): qty 1

## 2018-01-21 MED ORDER — INSULIN GLARGINE 100 UNIT/ML ~~LOC~~ SOLN
10.0000 [IU] | Freq: Every day | SUBCUTANEOUS | Status: DC
  Administered 2018-01-21: 10 [IU] via SUBCUTANEOUS
  Filled 2018-01-21 (×2): qty 0.1

## 2018-01-21 MED ORDER — SODIUM CHLORIDE 0.9 % IV SOLN: INTRAVENOUS | Status: AC

## 2018-01-21 MED ORDER — LIDOCAINE HCL 1 % IJ SOLN
INTRAMUSCULAR | Status: AC
  Filled 2018-01-21: qty 20

## 2018-01-21 MED ORDER — HEPARIN SODIUM (PORCINE) 1000 UNIT/ML IJ SOLN
INTRAMUSCULAR | Status: AC | PRN
  Administered 2018-01-21: 1000 [IU] via INTRAVENOUS

## 2018-01-21 MED ORDER — IOHEXOL 300 MG/ML  SOLN: 90.0000 mL | Freq: Once | INTRAMUSCULAR | Status: DC | PRN

## 2018-01-21 NOTE — Progress Notes (Addendum)
Inpatient Diabetes Program Recommendations  AACE/ADA: New Consensus Statement on Inpatient Glycemic Control (2015)  Target Ranges:  Prepandial:   less than 140 mg/dL      Peak postprandial:   less than 180 mg/dL (1-2 hours)      Critically ill patients:  140 - 180 mg/dL   Lab Results  Component Value Date   GLUCAP 184 (H) 01/21/2018   HGBA1C 9.7 (H) 01/20/2018    Review of Glycemic Control Results for Mark Foster, Mark Foster (MRN [id number]) as of 01/21/2018 10:43  Ref. Range 01/20/2018 19:50 01/20/2018 21:43 01/21/2018 08:24  Glucose-Capillary Latest Ref Range: 65 - 99 mg/dL 172 (H) 203 (H) 184 (H)   Diabetes history: Type 2 DM Outpatient Diabetes medications: Glipizide 10 mg BID, Januvia 100 mg QD, Metformin 1000 mg BID Current orders for Inpatient glycemic control: Novolog 0-15 units TID  Inpatient Diabetes Program Recommendations:    Would recommend adding Lantus 10 units QD to regimen. Will plan to see patient today.  Thanks, Bronson Curb, MSN, RNC-OB Diabetes Coordinator [phone] (8a-5p)

## 2018-01-21 NOTE — Progress Notes (Signed)
Pt self-administered evening dose of insulin with RN supervision; will be d/c'ing with subq insulin.

## 2018-01-21 NOTE — Progress Notes (Signed)
PT Cancellation Note  Patient Details Name: Mark Foster MRN: [id number] DOB: [birth year]   Cancelled Treatment:    Reason Eval/Treat Not Completed: Patient not medically ready;Active bedrest order.  Pt on bedrest until 1800 this evening.  Will try to see pt as able 5/21. 01/21/2018  Donnella Sham, [place] [phone]  (pager)   Mark Foster 01/21/2018, 1:44 PM

## 2018-01-21 NOTE — Procedures (Signed)
S/P 4 vessel cerebral arteriogram. RT CFA approach. Findings. 1.Approx 65 to 70 % stenosis RT ICA cavernous seg. 2.Approx 50  To 70 Stenosis of RT MCA prox. 3.Approx 50 % stenosis of mid basilar artery. 4.Severe 90% plus stenosis of origin of LT [place]. 5.Chronically occluded LT ICA prox

## 2018-01-21 NOTE — Progress Notes (Signed)
Occupational Therapy Treatment Patient Details Name: Mark Foster MRN: [id number] DOB: [birth year] Today's Date: 01/21/2018    History of present illness 62 y.o. male with history of a previous stroke, hypothyroidism, cervical spinal stenosis, and diabetes mellitus, presenting with right arm weakness. The patient received IV TPA Saturday 01/19/2018. Imaging showing Chronic left posterior MCA infarct and Abnormal flow void within the left ICA, consistent with occlusion per report.   OT comments  Pt with considerable improvement overnight.  He now demonstrates full isolated AROM of  Rt UE, with strength 4+/5 - 4/5 distally.  Min A - min guard for balance. Now recommend OPOT.   Follow Up Recommendations  Outpatient OT;Supervision - Intermittent    Equipment Recommendations  3 in 1 bedside commode;Tub/shower seat    Recommendations for Other Services      Precautions / Restrictions Precautions Precautions: Fall       Mobility Bed Mobility Overal bed mobility: Independent                Transfers Overall transfer level: Needs assistance   Transfers: Sit to/from Stand;Stand Pivot Transfers Sit to Stand: Min assist Stand pivot transfers: Min assist       General transfer comment: assist to steady... progressed to min guard assist     Balance Overall balance assessment: Needs assistance Sitting-balance support: Feet supported Sitting balance-Leahy Scale: Good     Standing balance support: No upper extremity supported Standing balance-Leahy Scale: Fair Standing balance comment: static standing with min guard assist.  Pt feels he is closer to his baseline                            ADL either performed or assessed with clinical judgement   ADL Overall ADL's : Needs assistance/impaired Eating/Feeding: Modified independent;Sitting           Lower Body Bathing: Minimal assistance;Sit to/from stand       Lower Body Dressing: Minimal assistance;Sit  to/from stand   Toilet Transfer: Minimal assistance;Ambulation;Comfort height toilet;Grab bars(quad cane )           Functional mobility during ADLs: Minimal assistance(cane ) General ADL Comments: assist for balance      Vision       Perception     Praxis      Cognition Arousal/Alertness: Awake/alert Behavior During Therapy: WFL for tasks assessed/performed Overall Cognitive Status: Within Functional Limits for tasks assessed                                          Exercises Exercises: Other exercises Other Exercises Other Exercises: Pt now demonstrates full isolates AROM  Rt UE.  Shoulder 4+/5 and elbow distally 4/5    Shoulder Instructions       General Comments      Pertinent Vitals/ Pain       Pain Assessment: No/denies pain  Home Living                                          Prior Functioning/Environment              Frequency  Min 2X/week        Progress Toward Goals  OT Goals(current goals can now be found in the  care plan section)  Progress towards OT goals: Progressing toward goals     Plan Discharge plan needs to be updated    Co-evaluation                 AM-PAC PT "6 Clicks" Daily Activity     Outcome Measure   Help from another person eating meals?: None Help from another person taking care of personal grooming?: A Little Help from another person toileting, which includes using toliet, bedpan, or urinal?: A Little Help from another person bathing (including washing, rinsing, drying)?: A Little Help from another person to put on and taking off regular upper body clothing?: A Little Help from another person to put on and taking off regular lower body clothing?: A Little 6 Click Score: 19    End of Session Equipment Utilized During Treatment: Gait belt;Other (comment)(cane )  OT Visit Diagnosis: Unsteadiness on feet (R26.81)   Activity Tolerance Patient tolerated treatment well    Patient Left in bed;with call bell/phone within reach;with family/visitor present   Nurse Communication Mobility status        Time: 0840-0900 OT Time Calculation (min): 20 min  Charges: OT General Charges $OT Visit: 1 Visit OT Treatments $Neuromuscular Re-education: 8-22 mins  [facility], OTR/L [phone]    Lucille Passy M 01/21/2018, 9:08 AM

## 2018-01-21 NOTE — Sedation Documentation (Signed)
Right groin sheath removed. 5 fr. Exoseal to right groin

## 2018-01-21 NOTE — Progress Notes (Signed)
  Echocardiogram 2D Echocardiogram has been performed.  Darlina Sicilian M 01/21/2018, 8:20 AM

## 2018-01-21 NOTE — Progress Notes (Signed)
Inpatient Diabetes Program Recommendations  AACE/ADA: New Consensus Statement on Inpatient Glycemic Control (2015)  Target Ranges:  Prepandial:   less than 140 mg/dL      Peak postprandial:   less than 180 mg/dL (1-2 hours)      Critically ill patients:  140 - 180 mg/dL   Lab Results  Component Value Date   GLUCAP 143 (H) 01/21/2018   HGBA1C 9.7 (H) 01/20/2018   Diabetes history: Type 2 DM Outpatient Diabetes medications: Glipizide 10 mg BID, Januvia 100 mg QD, Metformin 1000 mg BID Current orders for Inpatient glycemic control: Novolog 0-15 units TID, Lantus 10 units QHS  Inpatient Diabetes Program Recommendations:    Spoke with patient regarding outpatient diabetes management. Patient expresses frustration with diabetes. Currently, he is on 3 different oral medications and patient does not have good control. Patient checks BS 3-4 times a day and they exceed 200-250 mg/dL and have been as high as 500 mg/dL. Patient admits to avoiding foods that have carbohydrates in them because of glycemic control and is worried that if he eats it will go above 300. Additionally, patient has lost 30 lbs over the last year. He attributes this to his diabetes.   Reviewed patient's current A1c of 9.7%. Explained what a A1c is and what it measures. Also reviewed goal A1c with patient, importance of good glucose control @ home, and blood sugar goals. Reviewed patho behind DKA (as patient had questions), lack of insulin production from beta cells, long term co-morbidities associated with poor control.   Lastly, we discussed insulin. Patient states, " I have avoided it up until now, but I can not eat anything; I am losing weight, and I know I am not where I am supposed to be."  Discussed and educated on [facility] as an additional option for monitoring. Discussed inpatient diabetes study. Patient is interested, but wanting to think about the study in the event he is discharged on insulin. Educated patient  and spouse on insulin pen use at home. Reviewed contents of insulin flexpen starter kit. Reviewed basics of insulin pen and began discussion. Patient will need additional education.  At this point I am recommending basal insulin at discharge to establish better glycemic control. Information given regarding co-pay card. Per RN, patient is being transferred to 3W, will follow up for insulin teaching.  Thanks, Bronson Curb, MSN, RNC-OB Diabetes Coordinator [phone] (8a-5p)

## 2018-01-21 NOTE — Progress Notes (Signed)
Inpatient Rehabilitation  Per PT/OT request, patient was screened by Samatha Anspach for appropriateness for an Inpatient Acute Rehab consult.  At this time we are recommending an Inpatient Rehab consult.  Please order if you are agreeable.    Nikesh Teschner, M.A., CCC/SLP Admission Coordinator  [facility]  Cell [phone]  

## 2018-01-21 NOTE — Consult Note (Signed)
Physical Medicine and Rehabilitation Consult Reason for Consult: Right side weakness Referring Physician: Dr.Xu   HPI: Mark Foster is a 62 y.o. 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  Per chart review and patient, patient lives alone.  Used a quad cane prior to admission.  Patient still drives.  One level home with 3 steps to entry.  He does have a male companion in the area.  Presented 01/19/2018 with increasing right-sided weakness.  Cranial CT unremarkable for acute intracranial process. Old small left parietal MCA territory infarction.  MRI of the brain reviewed, unremarkable for acute intracranial process.  MRA showed occluded left ICA to the level of the terminus.  Focal severe proximal right M1 stenosis.  A repeat MRI of the brain 01/20/2018 again negative.  Echocardiogram with ejection fraction of 65% no wall motion abnormalities.  Neurology consulted with MRI C-spine showing spinal stenosis.  Cerebral angiogram was performed on 5/20.  Maintain on aspirin and Plavix for CVA prophylaxis.  Therapy evaluations completed with recommendations of physical medicine rehab consult.  Review of Systems  Constitutional: Negative for chills and fever.  HENT: Negative for hearing loss.   Eyes: Negative for blurred vision and double vision.  Respiratory: Negative for cough and shortness of breath.   Cardiovascular: Negative for chest pain and leg swelling.  Gastrointestinal: Positive for constipation. Negative for nausea and vomiting.  Genitourinary: Positive for urgency. Negative for dysuria, flank pain and hematuria.  Musculoskeletal: Positive for joint pain and myalgias.  Skin: Negative for rash.  Neurological: Positive for focal weakness.  All other systems reviewed and are negative.  Past Medical History:  Diagnosis Date  . Cervical compression fracture ([place])   . Cervical spinal stenosis   .  Stroke [facility])    2011  . Type 2 diabetes mellitus ([facility])    No past surgical history. No pertinent family history of premature CVA. Social History:  reports that he has never smoked. He has never used smokeless tobacco. He reports that he drinks alcohol. He reports that he does not use drugs. Allergies:  Allergies  Allergen Reactions  . Demerol [Meperidine Hcl] Other (See Comments)    convulsions   Medications Prior to Admission  Medication Sig Dispense Refill  . aspirin EC 81 MG tablet Take 81 mg by mouth daily.    Marland Kitchen glipiZIDE (GLUCOTROL) 10 MG tablet Take 10 mg by mouth 2 (two) times daily.  3  . JANUVIA 100 MG tablet Take 100 mg by mouth daily.  0  . lisinopril (PRINIVIL,ZESTRIL) 2.5 MG tablet Take 2.5 mg by mouth daily.    . metFORMIN (GLUCOPHAGE) 1000 MG tablet Take 1,000 mg by mouth 2 (two) times daily with a meal.    . NP THYROID 30 MG tablet Take 30 mg by mouth daily.  2  . pioglitazone (ACTOS) 45 MG tablet Take 45 mg by mouth daily.    . sertraline (ZOLOFT) 50 MG tablet Take 50 mg by mouth daily.    . vitamin B-12 (CYANOCOBALAMIN) 500 MCG tablet Take 500 mcg by mouth daily.      Home: Home Living Family/patient expects to be discharged to:: Private residence Living Arrangements: Alone Available Help at Discharge: Family, Available PRN/intermittently Type of Home: House Home Access: Stairs to enter [employer] of Steps: 3 Entrance Stairs-Rails: Can reach both [place]: One level Bathroom Shower/Tub: Multimedia programmer: [place]: Olds -  single point, Walker - 2 wheels  Lives With: Significant other  Functional History: Prior Function Level of Independence: Independent with assistive device(s) Comments: Pt retired from [employer] job last year due to balance issues.  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  He uses a 4 point cane for mobility, at times can ambulate in home without Functional Status:  Mobility: Bed  Mobility Overal bed mobility: Independent Bed Mobility: Supine to Sit Supine to sit: Min guard General bed mobility comments: up in chair  Transfers Overall transfer level: Needs assistance Equipment used: None Transfers: Sit to/from Stand, Stand Pivot Transfers Sit to Stand: Min assist Stand pivot transfers: Min assist General transfer comment: assist to steady... progressed to min guard assist  Ambulation/Gait Ambulation/Gait assistance: Mod assist(Max without device) Ambulation Distance (Feet): (70 ft with RW, 40 ft without device) Assistive device: Rolling walker (2 wheeled), 1 person hand held assist Gait Pattern/deviations: Step-through pattern, Decreased stride length, Staggering left, Staggering right, Narrow base of support General Gait Details: patient with significant instbaility during ambulation. Ambulated with and without device. Min assist/min guard with RW short distance without task change. Attempted head turns and gait speed changes with RW and patient with several overt LOB requiring increased physical assist to maintain upright. When attempting HHA patient extremely unsteady and almost unable to perform without moderate to at time max assist.  Gait velocity: decreased Gait velocity interpretation: <1.31 ft/sec, indicative of household ambulator    ADL: ADL Overall ADL's : Needs assistance/impaired Eating/Feeding: Modified independent, Sitting Grooming: Wash/dry hands, Wash/dry face, Oral care, Brushing hair, Moderate assistance, Standing Upper Body Bathing: Set up, Supervision/ safety, Sitting Lower Body Bathing: Minimal assistance, Sit to/from stand Upper Body Dressing : Minimal assistance, Sitting Upper Body Dressing Details (indicate cue type and reason): assist for fasteners  Lower Body Dressing: Minimal assistance, Sit to/from stand Lower Body Dressing Details (indicate cue type and reason): assist for balance and for fasteners  Toilet Transfer: Minimal  assistance, Ambulation, Comfort height toilet, Grab bars(quad cane ) Toilet Transfer Details (indicate cue type and reason): Pt very unsteady.  Heavy posterior bias  Toileting- Clothing Manipulation and Hygiene: Moderate assistance, Sit to/from stand Functional mobility during ADLs: Minimal assistance(cane ) General ADL Comments: assist for balance   Cognition: Cognition Overall Cognitive Status: Within Functional Limits for tasks assessed Arousal/Alertness: Awake/alert Orientation Level: (P) Oriented X4 Attention: Focused, Sustained, Selective Focused Attention: Appears intact Sustained Attention: Appears intact Selective Attention: Appears intact Memory: Impaired Memory Impairment: Decreased recall of new information Awareness: Appears intact Problem Solving: Impaired Problem Solving Impairment: Functional basic(2/3 ways to make $13) Executive Function: Reasoning, Sequencing, Organizing Reasoning: Appears intact Sequencing: Appears intact Organizing: Appears intact Safety/Judgment: Appears intact Cognition Arousal/Alertness: Awake/alert Behavior During Therapy: WFL for tasks assessed/performed Overall Cognitive Status: Within Functional Limits for tasks assessed Area of Impairment: Attention, Memory, Following commands, Problem solving Current Attention Level: Alternating Memory: Decreased short-term memory Following Commands: Follows one step commands consistently, Follows multi-step commands consistently, Follows multi-step commands with increased time Problem Solving: Requires verbal cues General Comments: WFL for basic tasks, but would benefit from further testing   Blood pressure 95/62, pulse 85, temperature 99.2 F (37.3 C), temperature source Oral, resp. rate 18, height 5\' 11"  (1.803 m), weight 70.8 kg (156 lb), SpO2 98 %. Physical Exam  Vitals reviewed. Constitutional: He is oriented to person, place, and time. He appears well-developed and well-nourished.  HENT:    Head: Normocephalic and atraumatic.  Eyes: EOM are normal. Right eye exhibits  no discharge. Left eye exhibits no discharge.  Neck: Normal range of motion. Neck supple. No thyromegaly present.  Cardiovascular: Normal rate, regular rhythm and normal heart sounds.  Respiratory: Effort normal and breath sounds normal. No respiratory distress.  GI: Soft. Bowel sounds are normal. He exhibits no distension.  Musculoskeletal:  No edema or tenderness in extremities  Neurological: He is alert and oriented to person, place, and time.  Motor: B/l UE 4+/5 proximal to distal RLE: 4/5 proximal to distal LLE: 4+-5/5 proximal to distal  Skin: Skin is warm and dry.  Psychiatric: He has a normal mood and affect. His behavior is normal. Thought content normal.    Results for orders placed or performed during the hospital encounter of 01/19/18 (from the past 24 hour(s))  Glucose, capillary     Status: Abnormal   Collection Time: 01/21/18  1:19 PM  Result Value Ref Range   Glucose-Capillary 143 (H) 65 - 99 mg/dL  Glucose, capillary     Status: Abnormal   Collection Time: 01/21/18  3:56 PM  Result Value Ref Range   Glucose-Capillary 171 (H) 65 - 99 mg/dL  Glucose, capillary     Status: Abnormal   Collection Time: 01/21/18  7:54 PM  Result Value Ref Range   Glucose-Capillary 217 (H) 65 - 99 mg/dL  CBC     Status: Abnormal   Collection Time: 01/22/18  3:35 AM  Result Value Ref Range   WBC 2.6 (L) 4.0 - 10.5 K/uL   RBC 4.02 (L) 4.22 - 5.81 MIL/uL   Hemoglobin 13.1 13.0 - 17.0 g/dL   HCT 37.0 (L) 39.0 - 52.0 %   MCV 92.0 78.0 - 100.0 fL   MCH 32.6 26.0 - 34.0 pg   MCHC 35.4 30.0 - 36.0 g/dL   RDW 13.3 11.5 - 15.5 %   Platelets 91 (L) 150 - 400 K/uL  Basic metabolic panel     Status: Abnormal   Collection Time: 01/22/18  3:35 AM  Result Value Ref Range   Sodium 141 135 - 145 mmol/L   Potassium 3.5 3.5 - 5.1 mmol/L   Chloride 108 101 - 111 mmol/L   CO2 25 22 - 32 mmol/L   Glucose, Bld 154 (H) 65  - 99 mg/dL   BUN 9 6 - 20 mg/dL   Creatinine, Ser 0.62 0.61 - 1.24 mg/dL   Calcium 8.6 (L) 8.9 - 10.3 mg/dL   GFR calc non Af Amer >60 >60 mL/min   GFR calc Af Amer >60 >60 mL/min   Anion gap 8 5 - 15  Glucose, capillary     Status: Abnormal   Collection Time: 01/22/18  7:42 AM  Result Value Ref Range   Glucose-Capillary 142 (H) 65 - 99 mg/dL   Comment 1 Notify RN    Mr Brain Wo Contrast  Result Date: 01/20/2018 CLINICAL DATA:  Stroke follow-up post tPA EXAM: MRI HEAD WITHOUT CONTRAST TECHNIQUE: Multiplanar, multiecho pulse sequences of the brain and surrounding structures were obtained without intravenous contrast. COMPARISON:  MRI head earlier today, CT head 01/19/2018 FINDINGS: Brain: Negative for acute infarct.  Negative for hemorrhage. Chronic infarct in the left frontal parietal lobe unchanged. Negative for mass or edema. No midline shift. Vascular: Occluded left internal carotid artery unchanged from earlier today. Remaining vessels show normal arterial flow void Skull and upper cervical spine: Negative Sinuses/Orbits: Mild mucosal edema paranasal sinuses.  Normal orbit Other: None IMPRESSION: Negative for acute infarct or hemorrhage. No change from earlier today. Electronically  Signed   By: Franchot Gallo M.D.   On: 01/20/2018 18:26   Assessment/Plan: Diagnosis: Weakness Labs and images independently reviewed.  Records reviewed and summated above.  1. Does the need for close, 24 hr/day medical supervision in concert with the patient's rehab needs make it unreasonable for this patient to be served in a less intensive setting? Potentially  2. 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 Due to safety, disease management and patient education, does the patient require 24 hr/day rehab nursing? Potentially 4. Does the patient require coordinated care of a physician, rehab nurse, PT (1-2 hrs/day, 5 days/week) and OT (1-2 hrs/day, 5 days/week) to address physical and functional deficits in the context of the above medical diagnosis(es)? Potentially Addressing deficits in the following areas: balance, endurance, locomotion, strength, transferring, bathing, dressing, toileting and psychosocial support 5. Can the patient actively participate in an intensive therapy program of at least 3 hrs of therapy per day at least 5 days per week? Yes 6. The potential for patient to make measurable gains while on inpatient rehab is good 7. Anticipated functional outcomes upon discharge from inpatient rehab are modified independent  with PT, modified independent with OT, n/a with SLP. 8. Estimated rehab length of stay to reach the above functional goals is: 4-7 days. 9. Anticipated D/C setting: Home 10. Anticipated post D/C treatments: Outpatient therapy and Home excercise program 11. Overall Rehab/Functional Prognosis: good  RECOMMENDATIONS: This patient's condition is appropriate for continued rehabilitative care in the following setting: Patient appears to be making functional progress, reflected in OT eval as well.  Patient does not believe he requires CIR and believes he is being discharged today.  Agree for safe discharge with caregiver. Further, per pt, ?plan for stent. Recommend outpt PM&R follow up. Patient has agreed to participate in recommended program. Potentially Note that insurance prior authorization may be required for reimbursement for recommended care.  Comment: Rehab Admissions Coordinator to follow up.   I have personally performed a face to face diagnostic evaluation, including, but not limited to relevant history and physical exam findings, of this  patient and developed relevant assessment and plan.  Additionally, I have reviewed and concur with the physician assistant's documentation above.   Delice Lesch, MD, ABPMR Ameliah Baskins Lorie Phenix, MD 01/22/2018
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
## 2018-01-21 NOTE — Progress Notes (Signed)
Pt in procedure during visit, will complete formal consult tomorrow.  Delice Lesch, MD, ABPMR
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
## 2018-01-22 ENCOUNTER — Encounter (HOSPITAL_COMMUNITY): Payer: Self-pay | Admitting: Interventional Radiology

## 2018-01-22 DIAGNOSIS — D72819 Decreased white blood cell count, unspecified: Secondary | ICD-10-CM

## 2018-01-22 DIAGNOSIS — Z8673 Personal history of transient ischemic attack (TIA), and cerebral infarction without residual deficits: Secondary | ICD-10-CM

## 2018-01-22 DIAGNOSIS — I10 Essential (primary) hypertension: Secondary | ICD-10-CM

## 2018-01-22 DIAGNOSIS — D696 Thrombocytopenia, unspecified: Secondary | ICD-10-CM

## 2018-01-22 DIAGNOSIS — M4802 Spinal stenosis, cervical region: Secondary | ICD-10-CM

## 2018-01-22 DIAGNOSIS — E119 Type 2 diabetes mellitus without complications: Secondary | ICD-10-CM

## 2018-01-22 DIAGNOSIS — I639 Cerebral infarction, unspecified: Secondary | ICD-10-CM

## 2018-01-22 DIAGNOSIS — I672 Cerebral atherosclerosis: Secondary | ICD-10-CM | POA: Diagnosis present

## 2018-01-22 DIAGNOSIS — I6522 Occlusion and stenosis of left carotid artery: Secondary | ICD-10-CM

## 2018-01-22 DIAGNOSIS — R29898 Other symptoms and signs involving the musculoskeletal system: Secondary | ICD-10-CM

## 2018-01-22 LAB — GLUCOSE, CAPILLARY
GLUCOSE-CAPILLARY: 142 mg/dL — AB (ref 65–99)
Glucose-Capillary: 173 mg/dL — ABNORMAL HIGH (ref 65–99)

## 2018-01-22 LAB — CBC
HEMATOCRIT: 37 % — AB (ref 39.0–52.0)
HEMOGLOBIN: 13.1 g/dL (ref 13.0–17.0)
MCH: 32.6 pg (ref 26.0–34.0)
MCHC: 35.4 g/dL (ref 30.0–36.0)
MCV: 92 fL (ref 78.0–100.0)
Platelets: 91 10*3/uL — ABNORMAL LOW (ref 150–400)
RBC: 4.02 MIL/uL — ABNORMAL LOW (ref 4.22–5.81)
RDW: 13.3 % (ref 11.5–15.5)
WBC: 2.6 10*3/uL — AB (ref 4.0–10.5)

## 2018-01-22 LAB — BASIC METABOLIC PANEL
ANION GAP: 8 (ref 5–15)
BUN: 9 mg/dL (ref 6–20)
CALCIUM: 8.6 mg/dL — AB (ref 8.9–10.3)
CO2: 25 mmol/L (ref 22–32)
CREATININE: 0.62 mg/dL (ref 0.61–1.24)
Chloride: 108 mmol/L (ref 101–111)
Glucose, Bld: 154 mg/dL — ABNORMAL HIGH (ref 65–99)
Potassium: 3.5 mmol/L (ref 3.5–5.1)
Sodium: 141 mmol/L (ref 135–145)

## 2018-01-22 MED ORDER — INSULIN GLARGINE 100 UNITS/ML SOLOSTAR PEN: 10.0000 [IU] | PEN_INJECTOR | Freq: Every day | SUBCUTANEOUS | 11 refills | Status: DC

## 2018-01-22 MED ORDER — ATORVASTATIN CALCIUM 80 MG PO TABS: 80.0000 mg | ORAL_TABLET | Freq: Every day | ORAL | 2 refills | Status: DC

## 2018-01-22 MED ORDER — INSULIN STARTER KIT- PEN NEEDLES (ENGLISH): 1.0000 | Freq: Once | Status: DC

## 2018-01-22 MED ORDER — ASPIRIN 325 MG PO TBEC: 325.0000 mg | DELAYED_RELEASE_TABLET | Freq: Every day | ORAL | 0 refills | Status: DC

## 2018-01-22 MED ORDER — INSULIN PEN NEEDLE 32G X 4 MM MISC: 1.0000 | Freq: Every day | 2 refills | Status: DC

## 2018-01-22 MED ORDER — INSULIN GLARGINE 100 UNIT/ML ~~LOC~~ SOLN: 10.0000 [IU] | Freq: Every day | SUBCUTANEOUS | 11 refills | Status: DC

## 2018-01-22 MED ORDER — FREESTYLE LIBRE 14 DAY SENSOR MISC: 1.0000 | 2 refills | Status: DC

## 2018-01-22 MED ORDER — CLOPIDOGREL BISULFATE 75 MG PO TABS: 75.0000 mg | ORAL_TABLET | Freq: Every day | ORAL | 2 refills | Status: DC

## 2018-01-22 MED ORDER — INSULIN STARTER KIT- PEN NEEDLES (ENGLISH): 1.0000 | Freq: Once | 0 refills | Status: AC

## 2018-01-22 MED ORDER — INSULIN STARTER KIT- PEN NEEDLES (ENGLISH)
1.0000 | Freq: Once | Status: AC
  Administered 2018-01-22: 1
  Filled 2018-01-22: qty 1

## 2018-01-22 NOTE — Progress Notes (Signed)
Removed gauze at right groin puncture site. The site was nontender and clean, dry, and intact. Band-aid applied.

## 2018-01-22 NOTE — Care Management Note (Signed)
Case Management Note  Patient Details  Name: Mark Foster MRN: [id number] Date of Birth: 1956-08-10  Subjective/Objective:   62 y.o. male with history of a previous stroke, hypothyroidism, cervical spinal stenosis, and diabetes mellitus, presenting with right arm weakness. The patient received IV TPA Saturday 01/19/2018. Imaging showing Chronic left posterior MCA infarct and Abnormal flow void within the left ICA, consistent with occlusion per report.  PTA, pt independent; lives at home alone.                   Action/Plan: Pt medically stable for dc home today.  Family available to assist intermittently.  PT/OT recommending OP follow up; referral made to [facility], per pt choice.  Rehab center to call pt for appointment.    Expected Discharge Date:  01/22/18               Expected Discharge Plan:  OP Rehab  In-House Referral:     Discharge planning Services  CM Consult  Post Acute Care Choice:    Choice offered to:     DME Arranged:    DME Agency:     HH Arranged:    HH Agency:     Status of Service:  Completed, signed off  If discussed at [employer] of Stay Meetings, dates discussed:    Additional Comments:  Reinaldo Raddle, RN, BSN  Trauma/Neuro ICU Case Manager [phone]

## 2018-01-22 NOTE — Plan of Care (Signed)
PT, OT, and the Diabetes Coordinator have all met with the patient today. Insulin starter kit given to patient. The patient ambulated in the hall and walked stairs. He will follow up with therapies as outpatient. Education completed and after visit summary given to patient.

## 2018-01-22 NOTE — Progress Notes (Signed)
Physical Therapy Treatment & Discharge Patient Details Name: Mark Foster MRN: [id number] DOB: [birth year] Today's Date: 01/22/2018    History of Present Illness 62 y.o. male with history of a previous stroke, hypothyroidism, cervical spinal stenosis, and diabetes mellitus, presenting with right arm weakness. The patient received IV TPA Saturday 01/19/2018. Imaging showing Chronic left posterior MCA infarct and Abnormal flow void within the left ICA, consistent with occlusion per report.    PT Comments    Patient much improved achieving acute PT goals this session able to negotiate stairs with supervision to minguard A for safety with cane and rail and prepping for d/c home.  Could benefit from outpatient PT for higher level balance challenges, though could continue to progress likely without follow up PT depending on preference.  Acute PT d/c this session as pt for d/c home and met goals.    Follow Up Recommendations  Outpatient PT     Equipment Recommendations  None recommended by PT    Recommendations for Other Services       Precautions / Restrictions Precautions Precautions: Fall    Mobility  Bed Mobility Overal bed mobility: Independent                Transfers   Equipment used: None Transfers: Sit to/from Stand Sit to Stand: Modified independent (Device/Increase time)            Ambulation/Gait Ambulation/Gait assistance: Modified independent (Device/Increase time) Ambulation Distance (Feet): 200 Feet Assistive device: Straight cane(with wide tip) Gait Pattern/deviations: WFL(Within Functional Limits);Step-through pattern     General Gait Details: carries cane in R hand and sequences with R leg; feels ambulation close to baseline (but not ready to speed up per pt)   Stairs Stairs: Yes Stairs assistance: Supervision;Min guard Stair Management: One rail Left;With cane;Step to pattern;Forwards Number of Stairs: 10 General stair comments: assist for  safety, demonstrates safe technique and no LOB   Wheelchair Mobility    Modified Rankin (Stroke Patients Only) Modified Rankin (Stroke Patients Only) Pre-Morbid Rankin Score: No significant disability Modified Rankin: Moderate disability     Balance     Sitting balance-Leahy Scale: Good       Standing balance-Leahy Scale: Good Standing balance comment: no assist with static standing                            Cognition Arousal/Alertness: Awake/alert Behavior During Therapy: WFL for tasks assessed/performed Overall Cognitive Status: Within Functional Limits for tasks assessed                                        Exercises      General Comments General comments (skin integrity, edema, etc.): reviewed stroke warning signs and symptoms and importance of time to treatment      Pertinent Vitals/Pain Faces Pain Scale: Hurts a little bit Pain Location: r thigh Pain Descriptors / Indicators: Aching Pain Intervention(s): Monitored during session    Home Living                      Prior Function            PT Goals (current goals can now be found in the care plan section) Progress towards PT goals: Goals met/education completed, patient discharged from PT    Frequency    Min 4X/week  PT Plan Discharge plan needs to be updated    Co-evaluation              AM-PAC PT "6 Clicks" Daily Activity  Outcome Measure  Difficulty turning over in bed (including adjusting bedclothes, sheets and blankets)?: None Difficulty moving from lying on back to sitting on the side of the bed? : None Difficulty sitting down on and standing up from a chair with arms (e.g., wheelchair, bedside commode, etc,.)?: None Help needed moving to and from a bed to chair (including a wheelchair)?: None Help needed walking in hospital room?: None Help needed climbing 3-5 steps with a railing? : A Little 6 Click Score: 23    End of Session  Equipment Utilized During Treatment: Gait belt Activity Tolerance: Patient tolerated treatment well Patient left: in bed;with call bell/phone within reach   PT Visit Diagnosis: Other abnormalities of gait and mobility (R26.89)     Time: 2080-2233 PT Time Calculation (min) (ACUTE ONLY): 23 min  Charges:  $Gait Training: 8-22 mins $Self Care/Home Management: 8-22                    G CodesMagda Kiel, [place] 01/22/2018    Reginia Naas 01/22/2018, 12:58 PM

## 2018-01-22 NOTE — Progress Notes (Addendum)
Patient has signed and been given copy of consent for CGM/Freestyle [place] research study. MD also notified.  Education done regarding application and changing CGM sensor (alternate every 14 days on back of arms), 1 hour warm-up, use of glucometer/where to buy strips, how to scan CGM for glucose reading and information for PCP. Patient has been given [employer] reader and first sensor for use. Patient has also been given educational packet regarding use CGM sensor including the 1-800 toll free number for any questions, problems or needs related to the [facility] sensors or reader.  Patient to be given Rx. For sensors with prescriptions/discharge paper work.  Sensor applied by patient to right arm at 13:45.  Explained that glucose readings will not be available until 1 hour after application. Patient understands that Diabetes coordinator will call them 2 times after discharge (between days 7-12 after d/c from hospital and between days 30-25 after d/c from hospital). Patient verbalizes understanding of use of Freestyle Libre CGM and was told that any issues with blood sugars/diabetes will need to be addressed by PCP.  Diabetes Quality of Life Survey administered to patient.   Discussed insulin administration with patient. Reviewed and demonstrated insulin injections with vial/syringe versus insulin pens. Patient states that he prefers insulin pens. Educated patient on insulin pen use at home. Reviewed contents of insulin flexpen starter kit. Reviewed all steps of insulin pen including attachment of needle, 2-unit air shot, dialing up dose, giving injection, removing needle, disposal of sharps, storage of unused insulin, disposal of insulin etc. Patient able to provide successful return demonstration. MD to give patient Rxs for Lantus SoloStar insulin pens and insulin pen needles (order # [id number]). Also [employer] (2) order number [id number].  Thanks, Barnie Alderman, RN, MSN, CDE Diabetes  Coordinator Inpatient Diabetes Program [phone] (Team Pager from 8am to 5pm)

## 2018-01-22 NOTE — Plan of Care (Signed)
  RD consulted for nutrition education regarding diabetes.  Pt reports blood sugars ranging from 200-300's. He has been told recent to not eat any carbohydrates and has been losing weight and blood sugars have not decreased.  Pt is being discharge today.   Lab Results  Component Value Date   HGBA1C 9.7 (H) 01/20/2018    RD provided "Carbohydrate Counting for People with Diabetes" handout from the Academy of Nutrition and Dietetics. Discussed different food groups and their effects on blood sugar, emphasizing carbohydrate-containing foods. Provided list of carbohydrates and recommended serving sizes of common foods.  Discussed importance of controlled and consistent carbohydrate intake throughout the day. Provided examples of ways to balance meals/snacks and encouraged intake of high-fiber, whole grain complex carbohydrates. Teach back method used.  Expect fair compliance.  Body mass index is 21.76 kg/m.   Current diet order is Heart Healthy, patient is consuming approximately 100% of meals at this time. Labs and medications reviewed. No further nutrition interventions warranted at this time. RD contact information provided. If additional nutrition issues arise, please re-consult RD.  [place], [place], [place] Pager [phone] After Hours Pager

## 2018-01-22 NOTE — Progress Notes (Signed)
   01/22/18 1000  Clinical Encounter Type  Visited With Patient  Visit Type Follow-up  Referral From Nurse  Consult/Referral To Chaplain  Spiritual Encounters  Spiritual Needs Literature;Prayer  Stress Factors  Patient Stress Factors Major life changes  Family Stress Factors Major life changes  Chaplain visited wit PT and discussed AD paperwork, Paperwork was completed and the PT sounds as if they will be discharged.  PT was thankful to be able to check this off of his list of items.

## 2018-01-23 ENCOUNTER — Other Ambulatory Visit (HOSPITAL_COMMUNITY): Payer: Self-pay | Admitting: Interventional Radiology

## 2018-01-23 ENCOUNTER — Encounter (HOSPITAL_COMMUNITY): Payer: Self-pay

## 2018-01-23 DIAGNOSIS — I771 Stricture of artery: Secondary | ICD-10-CM

## 2018-01-24 ENCOUNTER — Ambulatory Visit (HOSPITAL_COMMUNITY): Payer: BLUE CROSS/BLUE SHIELD | Attending: Neurology

## 2018-01-24 ENCOUNTER — Other Ambulatory Visit: Payer: Self-pay

## 2018-01-24 ENCOUNTER — Encounter (HOSPITAL_COMMUNITY): Payer: Self-pay

## 2018-01-24 ENCOUNTER — Other Ambulatory Visit (HOSPITAL_COMMUNITY): Payer: Self-pay | Admitting: Interventional Radiology

## 2018-01-24 ENCOUNTER — Other Ambulatory Visit: Payer: Self-pay | Admitting: *Deleted

## 2018-01-24 DIAGNOSIS — R29898 Other symptoms and signs involving the musculoskeletal system: Secondary | ICD-10-CM | POA: Diagnosis present

## 2018-01-24 DIAGNOSIS — R2689 Other abnormalities of gait and mobility: Secondary | ICD-10-CM | POA: Diagnosis present

## 2018-01-24 DIAGNOSIS — M6281 Muscle weakness (generalized): Secondary | ICD-10-CM | POA: Diagnosis present

## 2018-01-24 DIAGNOSIS — I771 Stricture of artery: Secondary | ICD-10-CM

## 2018-01-24 NOTE — Patient Outreach (Signed)
[facility]) Care Management  01/24/2018  Jamiere Gulas [birth year] [id number]   EMMI-  RED ON EMMI ALERT Day # 1 Date: 01/23/18 Red Alert Reason: scheduled a follow up appointment? I don't know   Outreach attempt #1   Spoke briefly with Mr Kon who informed [facility] CM he was in the grocery store at the time of the call and would return a call to CM.  Plan: RN CM will return a call to Mr Pousson in 4 business days   [employer] Lavina Hamman, RN, BSN, CCM [facility] Telephonic Care Management Care Coordinator Direct number [phone]  Main [facility] number [phone] Fax number [phone]
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
## 2018-01-25 ENCOUNTER — Other Ambulatory Visit: Payer: Self-pay | Admitting: *Deleted

## 2018-01-25 NOTE — Patient Outreach (Addendum)
  [facility]) Care Management  01/25/2018  Cordera Stineman Jan 04, 1956 [id number]    RED RED ON EMMI ALERT Day # 1 Date: 01/23/18 Red Alert Reason: scheduled a follow up appointment? I don't know   Outreach attempt #2   Spoke with patient. Reviewed and addressed red alert. He confirms he saw his pcp on 01/24/18 and is scheduled to see Dr Nancee Liter on 01/29/18  He has not made an appointment to [facility] neurology but is awaiting referral results and reports he can follow up on the appointment independently Denies residual weaknesses and is able to complete independently all ADLs and iADLs. Has a male friend who works but stops by to assist as needed  Advised patient that they would continue to get automated EMMI-Stroke post discharge calls to assess how they are doing following recent hospitalization and will receive a call from a nurse if any of their responses were abnormal. Patient voiced understanding and was appreciative of follow up call.  Plan: RN CM will close case at this time as patient has been assessed and no needs identified.   Discussed [facility] program    Lashya Passe L. Lavina Hamman, RN, BSN, CCM [facility] Telephonic Care Management Care Coordinator Direct number [phone]  Main [facility] number [phone] Fax number [phone]

## 2018-01-29 ENCOUNTER — Telehealth: Payer: Self-pay | Admitting: *Deleted

## 2018-01-29 ENCOUNTER — Ambulatory Visit (HOSPITAL_COMMUNITY)
Admission: RE | Admit: 2018-01-29 | Discharge: 2018-01-29 | Disposition: A | Discharge status: Home / Self Care | Payer: BLUE CROSS/BLUE SHIELD | Source: Ambulatory Visit | Attending: Interventional Radiology | Admitting: Interventional Radiology

## 2018-01-29 ENCOUNTER — Other Ambulatory Visit: Payer: Self-pay | Admitting: *Deleted

## 2018-01-29 DIAGNOSIS — I771 Stricture of artery: Secondary | ICD-10-CM

## 2018-01-29 NOTE — Patient Outreach (Signed)
[facility]) Care Management  01/29/2018  Mark Foster Feb 05, 1956 [id number]   EMMI-  RED ON EMMI ALERT Day # 6 Date: 01/28/18 Red Alert Reason: Feeling worse overall Yes Questions/problems with meds Yes   Outreach attempt # 1 No answer.  RN CM left HIPAA compliant message along with contact info.    Plan:  RN CM will send an outreach letter to [name] and pend him out for another outreach call within 4 business days  [employer] Lavina Hamman, RN, BSN, CCM [facility] Telephonic Care Management Care Coordinator Direct number [phone]  Main [facility] number [phone] Fax number [phone]

## 2018-01-29 NOTE — Consult Note (Signed)
Chief Complaint: Patient was seen in consultation today for left vertebral artery stenosis and right ICA cavernous segment stenosis.  Supervising Physician: Luanne Bras  Patient Status: [name] - Out-pt  History of Present Illness: Mark Foster is a 62 y.o. 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 He presented to the ED 01/19/2018 with symptom of right arm weakness, and was diagnosed with CVA. He underwent a diagnostic cerebral angiogram 01/21/2018 with Dr. Estanislado Pandy. He was discharged home 01/22/2018.  Image-guided diagnostic cerebral angiogram 01/21/2018 with Dr. Estanislado Pandy: 1. Approximately 65-70% stenosis of the right internal carotid artery and the caval cavernous segment with pre stenotic dilatation. 2. Approximately 50-70% stenosis of the proximal right middle cerebral artery. 3. Approximately 50% stenosis of the mid basilar artery. 4. 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 5. Severe stenosis of the proximal cavernous segment of the left internal carotid artery. 6. Severe 90% plus stenosis of the origin of the left vertebral artery.  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 Complains of right-sided and bilateral lower extremity weakness x years. States that he has had multiple strokes, and he has not been the same since. States he has difficulty with gait. Uses cane for ambulation. States he is finally beginning PT tomorrow for this. Complains of intermittent right frontal headaches. Denies N/V. Complains of daily intermittent dizziness. Denies numbness/tingling, vision changes, hearing changes, tinnitus, or speech difficulty.  Patient is currently taking Plavix 75 mg once daily and Aspirin 325 mg once  daily.  Past Medical History:  Diagnosis Date  . Cervical compression fracture ([place])   . Cervical spinal stenosis   . Stroke [facility])    2011  . Type 2 diabetes mellitus ([place])     Past Surgical History:  Procedure Laterality Date  . IR ANGIO INTRA EXTRACRAN SEL COM CAROTID INNOMINATE BILAT MOD SED  01/21/2018  . IR ANGIO VERTEBRAL SEL VERTEBRAL BILAT MOD SED  01/21/2018    Allergies: Demerol [meperidine hcl]  Medications: Prior to Admission medications   Medication Sig Start Date End Date Taking? Authorizing Provider  aspirin EC 325 MG EC tablet Take 1 tablet (325 mg total) by mouth daily. 01/23/18   Donzetta Starch, NP  atorvastatin (LIPITOR) 80 MG tablet Take 1 tablet (80 mg total) by mouth daily at 6 PM. 01/22/18   Donzetta Starch, NP  clopidogrel (PLAVIX) 75 MG tablet Take 1 tablet (75 mg total) by mouth daily. 01/23/18   Donzetta Starch, NP  Continuous Blood Gluc Sensor (FREESTYLE LIBRE 14 DAY SENSOR) MISC 1 Device by Does not apply route every 14 (fourteen) days. 01/22/18   Donzetta Starch, NP  insulin glargine (LANTUS) 100 unit/mL SOPN Inject 0.1 mLs (10 Units total) into the skin at bedtime. 01/22/18   Donzetta Starch, NP  Insulin Pen Needle 32G X 4 MM MISC 1 Device by Does not apply route daily. 01/22/18   Donzetta Starch, NP  NP THYROID 30 MG tablet Take 30 mg by mouth daily. 01/17/18   [provider]  sertraline (ZOLOFT) 50 MG tablet Take 50 mg by mouth daily.    [provider]  vitamin B-12 (CYANOCOBALAMIN) 500 MCG tablet Take 500 mcg by mouth daily.    [provider]     No family  history on file.  Social History   Socioeconomic History  . Marital status: Single    Spouse name: Not on file  . Number of children: Not on file  . Years of education: Not on file  . Highest education level: Not on file  Occupational History  . Not on file  Social Needs  . Financial resource strain: Not on file  . Food insecurity:    Worry: Not on file     Inability: Not on file  . Transportation needs:    Medical: Not on file    Non-medical: Not on file  Tobacco Use  . Smoking status: Never Smoker  . Smokeless tobacco: Never Used  Substance and Sexual Activity  . Alcohol use: Yes  . Drug use: Never  . Sexual activity: Not on file  Lifestyle  . Physical activity:    Days per week: Not on file    Minutes per session: Not on file  . Stress: Not on file  Relationships  . Social connections:    Talks on phone: Not on file    Gets together: Not on file    Attends religious service: Not on file    Active member of club or organization: Not on file    Attends meetings of clubs or organizations: Not on file    Relationship status: Not on file  Other Topics Concern  . Not on file  Social History Narrative  . Not on file     Review of Systems: A 12 point ROS discussed and pertinent positives are indicated in the HPI above.  All other systems are negative.  Review of Systems  Constitutional: Negative for activity change and fever.  HENT: Negative for hearing loss, tinnitus and trouble swallowing.   Eyes: Negative for visual disturbance.  Respiratory: Negative for shortness of breath and wheezing.   Cardiovascular: Negative for chest pain and palpitations.  Musculoskeletal: Positive for gait problem.  Neurological: Positive for dizziness, weakness and headaches. Negative for speech difficulty and numbness.  Psychiatric/Behavioral: Negative for behavioral problems and confusion.    Vital Signs: There were no vitals taken for this visit.  Physical Exam  Constitutional: He is oriented to person, place, and time. He appears well-developed and well-nourished. No distress.  Pulmonary/Chest: Effort normal. No respiratory distress.  Neurological: He is alert and oriented to person, place, and time.  Skin: Skin is warm and dry.  Psychiatric: He has a normal mood and affect. His behavior is normal. Judgment and thought content normal.    Nursing note and vitals reviewed.    Imaging: [name] Neck W Wo Contrast  Result Date: 01/20/2018 CLINICAL DATA:  Initial evaluation for acute right arm weakness. History of prior stroke. EXAM: MRI HEAD WITHOUT CONTRAST MRA HEAD WITHOUT CONTRAST MRA NECK WITHOUT AND WITH CONTRAST TECHNIQUE: Multiplanar, multiecho pulse sequences of the brain and surrounding structures were obtained without intravenous contrast. Angiographic images of the Circle of Willis were obtained using MRA technique without intravenous contrast. Angiographic images of the neck were obtained using MRA technique without and with intravenous contrast. Carotid stenosis measurements (when applicable) are obtained utilizing NASCET criteria, using the distal internal carotid diameter as the denominator. CONTRAST:  MultiHance. COMPARISON:  Comparison made with prior CT from 01/19/2018. FINDINGS: MRI HEAD FINDINGS Age-related cerebral volume loss. No significant cerebral white matter change for age. Focal area of encephalomalacia with gliosis within the posterior left frontal lobe compatible with remote left MCA territory infarct. Small amount of chronic hemosiderin staining  within this region. No abnormal foci of restricted diffusion to suggest acute or subacute ischemia. Gray-white matter differentiation otherwise maintained. No other areas of chronic infarction. No evidence for acute intracranial hemorrhage. No mass lesion, midline shift or mass effect. No hydrocephalus. No extra-axial fluid collection. Major dural sinuses are grossly patent. Pituitary gland suprasellar region normal. Midline structures intact and normal. Abnormal flow void within the left ICA to the level of the terminus, likely occluded. Major intracranial vascular flow voids otherwise maintained. Diminutive vertebrobasilar system. Craniocervical junction normal. Upper cervical spine within normal limits. Bone marrow signal intensity normal. No scalp soft tissue  abnormality. Globes and orbital soft tissues within normal limits. Scattered mucosal thickening within the ethmoidal air cells. Right maxillary sinus retention cyst. Paranasal sinuses are otherwise clear. Trace opacity bilateral mastoid air cells, of doubtful significance. Inner ear structures normal. MRA HEAD FINDINGS ANTERIOR CIRCULATION: Left ICA occluded to the level of the terminus. Distal cervical right ICA widely patent as is the petrous right ICA. Scattered atheromatous irregularity within the cavernous/supraclinoid ICA. Tandem moderate stenoses at the proximal aspect and anterior genu of the cavernous right ICA (series 9001, image 71, 83). Supraclinoid right ICA otherwise patent to the terminus. Short-segment severe proximal right M1 stenosis (series 9001, image 102). Right M1 bifurcates early. Atheromatous irregularity within proximal right M2 branches without high-grade stenosis. Right MCA branches well perfused distally. Right A1 patent without stenosis. Normal anterior communicating artery. Anterior cerebral arteries well perfused to their distal aspects without high-grade stenosis. Hypoplastic left A1 with superimposed severe stenosis (series 1033001, image 9). Left ICA terminus is patent via flow across the circle-of-Willis and from the posterior circulation. Left MCA is perfused with somewhat attenuated as compared to the right. Left M1 segment patent without stenosis. No proximal left M2 occlusion. Distal left MCA branches well perfused. POSTERIOR CIRCULATION: Vertebral artery somewhat diminutive bilaterally with mild multifocal irregularity without high-grade stenosis. Partially visualized posterior inferior cerebral arteries patent bilaterally. Basilar artery diffusely diminutive with tandem moderate stenoses involving the mid and distal basilar. Superior cerebral arteries patent bilaterally. Fetal type origin of the PCAs supplied via posterior communicating arteries. PCAs demonstrate scattered  atheromatous irregularity without high-grade stenosis, and are perfused to their distal aspects. No aneurysm. MRA NECK FINDINGS Source images reviewed. Visualized aortic arch of normal caliber with normal branch pattern. No flow-limiting stenosis about the origin of the great vessels. Visualized subclavian arteries widely patent. Right common carotid artery patent from its origin to the bifurcation. No significant atheromatous stenosis about the right bifurcation. Right ICA patent from the bifurcation to the skull base without stenosis or occlusion. Left common carotid artery patent from its origin to the bifurcation. There is occlusion of the left ICA at the level of the bifurcation, which remains occluded within the neck. Both of the vertebral arteries arise from the subclavian arteries. Right vertebral artery dominant. Suspected at least moderate stenoses at the origin of the vertebral arteries bilaterally. Vertebral arteries otherwise patent within the neck without high-grade stenosis or occlusion. IMPRESSION: MRI HEAD IMPRESSION 1. No acute intracranial abnormality identified. 2. Chronic left posterior MCA infarct. 3. Abnormal flow void within the left ICA, consistent with occlusion. See below. MRA HEAD IMPRESSION 1. 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 Left MCA is perfused via collateral flow cross the circle-of-Willis as well as from the posterior circulation, although flow within the left MCA overall somewhat attenuated as compared to the right. 2. Moderate tandem stenoses involving the proximal and mid cavernous right  ICA. 3. Focal severe proximal right M1 stenosis. 4. Focal severe mid left A1 stenosis. 5. Fetal type origin of the PCAs with overall diminutive vertebrobasilar system. Moderate tandem stenoses involving the mid and distal basilar artery. MRA NECK IMPRESSION 1. 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 2. Right carotid artery system widely patent within the neck  without stenosis. 3. Suspected at least moderate stenoses at the origin of the vertebral arteries bilaterally. Vertebral arteries otherwise patent within the neck without stenosis or occlusion. Electronically Signed   By: Jeannine Boga M.D.   On: 01/20/2018 06:19   Mr Brain Wo Contrast  Result Date: 01/20/2018 CLINICAL DATA:  Stroke follow-up post tPA EXAM: MRI HEAD WITHOUT CONTRAST TECHNIQUE: Multiplanar, multiecho pulse sequences of the brain and surrounding structures were obtained without intravenous contrast. COMPARISON:  MRI head earlier today, CT head 01/19/2018 FINDINGS: Brain: Negative for acute infarct.  Negative for hemorrhage. Chronic infarct in the left frontal parietal lobe unchanged. Negative for mass or edema. No midline shift. Vascular: Occluded left internal carotid artery unchanged from earlier today. Remaining vessels show normal arterial flow void Skull and upper cervical spine: Negative Sinuses/Orbits: Mild mucosal edema paranasal sinuses.  Normal orbit Other: None IMPRESSION: Negative for acute infarct or hemorrhage. No change from earlier today. Electronically Signed   By: Franchot Gallo M.D.   On: 01/20/2018 18:26   Mr Brain Wo Contrast  Result Date: 01/20/2018 CLINICAL DATA:  Initial evaluation for acute right arm weakness. History of prior stroke. EXAM: MRI HEAD WITHOUT CONTRAST MRA HEAD WITHOUT CONTRAST MRA NECK WITHOUT AND WITH CONTRAST TECHNIQUE: Multiplanar, multiecho pulse sequences of the brain and surrounding structures were obtained without intravenous contrast. Angiographic images of the Circle of Willis were obtained using MRA technique without intravenous contrast. Angiographic images of the neck were obtained using MRA technique without and with intravenous contrast. Carotid stenosis measurements (when applicable) are obtained utilizing NASCET criteria, using the distal internal carotid diameter as the denominator. CONTRAST:  MultiHance. COMPARISON:  Comparison  made with prior CT from 01/19/2018. FINDINGS: MRI HEAD FINDINGS Age-related cerebral volume loss. No significant cerebral white matter change for age. Focal area of encephalomalacia with gliosis within the posterior left frontal lobe compatible with remote left MCA territory infarct. Small amount of chronic hemosiderin staining within this region. No abnormal foci of restricted diffusion to suggest acute or subacute ischemia. Gray-white matter differentiation otherwise maintained. No other areas of chronic infarction. No evidence for acute intracranial hemorrhage. No mass lesion, midline shift or mass effect. No hydrocephalus. No extra-axial fluid collection. Major dural sinuses are grossly patent. Pituitary gland suprasellar region normal. Midline structures intact and normal. Abnormal flow void within the left ICA to the level of the terminus, likely occluded. Major intracranial vascular flow voids otherwise maintained. Diminutive vertebrobasilar system. Craniocervical junction normal. Upper cervical spine within normal limits. Bone marrow signal intensity normal. No scalp soft tissue abnormality. Globes and orbital soft tissues within normal limits. Scattered mucosal thickening within the ethmoidal air cells. Right maxillary sinus retention cyst. Paranasal sinuses are otherwise clear. Trace opacity bilateral mastoid air cells, of doubtful significance. Inner ear structures normal. MRA HEAD FINDINGS ANTERIOR CIRCULATION: Left ICA occluded to the level of the terminus. Distal cervical right ICA widely patent as is the petrous right ICA. Scattered atheromatous irregularity within the cavernous/supraclinoid ICA. Tandem moderate stenoses at the proximal aspect and anterior genu of the cavernous right ICA (series 9001, image 71, 83). Supraclinoid right ICA otherwise patent to the  terminus. Short-segment severe proximal right M1 stenosis (series 9001, image 102). Right M1 bifurcates early. Atheromatous irregularity  within proximal right M2 branches without high-grade stenosis. Right MCA branches well perfused distally. Right A1 patent without stenosis. Normal anterior communicating artery. Anterior cerebral arteries well perfused to their distal aspects without high-grade stenosis. Hypoplastic left A1 with superimposed severe stenosis (series 1033001, image 9). Left ICA terminus is patent via flow across the circle-of-Willis and from the posterior circulation. Left MCA is perfused with somewhat attenuated as compared to the right. Left M1 segment patent without stenosis. No proximal left M2 occlusion. Distal left MCA branches well perfused. POSTERIOR CIRCULATION: Vertebral artery somewhat diminutive bilaterally with mild multifocal irregularity without high-grade stenosis. Partially visualized posterior inferior cerebral arteries patent bilaterally. Basilar artery diffusely diminutive with tandem moderate stenoses involving the mid and distal basilar. Superior cerebral arteries patent bilaterally. Fetal type origin of the PCAs supplied via posterior communicating arteries. PCAs demonstrate scattered atheromatous irregularity without high-grade stenosis, and are perfused to their distal aspects. No aneurysm. MRA NECK FINDINGS Source images reviewed. Visualized aortic arch of normal caliber with normal branch pattern. No flow-limiting stenosis about the origin of the great vessels. Visualized subclavian arteries widely patent. Right common carotid artery patent from its origin to the bifurcation. No significant atheromatous stenosis about the right bifurcation. Right ICA patent from the bifurcation to the skull base without stenosis or occlusion. Left common carotid artery patent from its origin to the bifurcation. There is occlusion of the left ICA at the level of the bifurcation, which remains occluded within the neck. Both of the vertebral arteries arise from the subclavian arteries. Right vertebral artery dominant. Suspected  at least moderate stenoses at the origin of the vertebral arteries bilaterally. Vertebral arteries otherwise patent within the neck without high-grade stenosis or occlusion. IMPRESSION: MRI HEAD IMPRESSION 1. No acute intracranial abnormality identified. 2. Chronic left posterior MCA infarct. 3. Abnormal flow void within the left ICA, consistent with occlusion. See below. MRA HEAD IMPRESSION 1. 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 Left MCA is perfused via collateral flow cross the circle-of-Willis as well as from the posterior circulation, although flow within the left MCA overall somewhat attenuated as compared to the right. 2. Moderate tandem stenoses involving the proximal and mid cavernous right ICA. 3. Focal severe proximal right M1 stenosis. 4. Focal severe mid left A1 stenosis. 5. Fetal type origin of the PCAs with overall diminutive vertebrobasilar system. Moderate tandem stenoses involving the mid and distal basilar artery. MRA NECK IMPRESSION 1. 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 2. Right carotid artery system widely patent within the neck without stenosis. 3. Suspected at least moderate stenoses at the origin of the vertebral arteries bilaterally. Vertebral arteries otherwise patent within the neck without stenosis or occlusion. Electronically Signed   By: Jeannine Boga M.D.   On: 01/20/2018 06:19   Mr Cervical Spine Wo Contrast  Result Date: 01/20/2018 CLINICAL DATA:  Initial evaluation for cervical stenosis, right arm weakness. EXAM: MRI CERVICAL SPINE WITHOUT CONTRAST TECHNIQUE: Multiplanar, multisequence MR imaging of the cervical spine was performed. No intravenous contrast was administered. COMPARISON:  None available. FINDINGS: Alignment: Straightening of the normal cervical lordosis. No listhesis. Vertebrae: Vertebral body heights maintained without evidence for acute or chronic fracture. Bone marrow signal intensity within normal limits. Small benign  hemangioma noted within the C5 vertebral body. No other discrete or worrisome osseous lesions. Mild reactive endplate changes present about the C6-7 interspace.  Cord: Subtle patchy T2 signal abnormality within the left aspect of the cervical spinal cord at the level of C5-6, suspicious for chronic myelomalacia related to disc bulge (series 8001, image 28). Signal intensity within the cervical spinal cord otherwise within normal limits. Posterior Fossa, vertebral arteries, paraspinal tissues: Visualized brain and posterior fossa within normal limits. Craniocervical junction normal. Paraspinous and prevertebral soft tissues normal. Normal intravascular flow voids present within the vertebral arteries bilaterally. Disc levels: C2-C3: Minimal uncovertebral hypertrophy without significant stenosis. C3-C4: Mild left greater than right uncovertebral hypertrophy. No significant disc bulge. Mild left C4 foraminal narrowing. No significant right foraminal encroachment. C4-C5: Chronic diffuse degenerative disc osteophyte with intervertebral disc space narrowing. Broad posterior component flattens the ventral CSF. Superimposed ligamentum flavum thickening. Moderate spinal stenosis with mild cord flattening. Fairly severe bilateral C5 foraminal stenosis, left worse than right. C5-C6: Chronic diffuse degenerative disc osteophyte with intervertebral disc space narrowing. Broad posterior component flattens the ventral CSF, greater on the left. Flattening of the cervical spinal cord, also greater on the left, with subtle cord signal abnormality. Moderate to severe spinal stenosis. Severe bilateral C6 foraminal narrowing. C6-C7: Chronic diffuse degenerative disc osteophyte with intervertebral disc space narrowing and reactive endplate changes. Flattening of the ventral CSF with resultant moderate spinal stenosis. Moderate bilateral C7 foraminal narrowing, slightly worse on the right. C7-T1: Bilateral facet degeneration, greater on  the right. No significant spinal stenosis. Mild right C8 foraminal stenosis. Visualized upper thoracic spine within normal limits. IMPRESSION: 1. Multilevel cervical spondylolysis with resultant moderate to severe diffuse spinal stenosis at C4-5 through C6-7, most severe at C5-6. 2. Subtle T2 signal abnormality within the left aspect of the cervical spinal cord at C5-6, suspicious for myelomalacia related to disc disease and stenosis. 3. Multifactorial degenerative changes with resultant multilevel foraminal narrowing as above. Notable findings include severe bilateral C5 and C6 foraminal narrowing with moderate bilateral C7 foraminal stenosis. Electronically Signed   By: Jeannine Boga M.D.   On: 01/20/2018 06:29   [name] Head Wo Contrast  Result Date: 01/20/2018 CLINICAL DATA:  Initial evaluation for acute right arm weakness. History of prior stroke. EXAM: MRI HEAD WITHOUT CONTRAST MRA HEAD WITHOUT CONTRAST MRA NECK WITHOUT AND WITH CONTRAST TECHNIQUE: Multiplanar, multiecho pulse sequences of the brain and surrounding structures were obtained without intravenous contrast. Angiographic images of the Circle of Willis were obtained using MRA technique without intravenous contrast. Angiographic images of the neck were obtained using MRA technique without and with intravenous contrast. Carotid stenosis measurements (when applicable) are obtained utilizing NASCET criteria, using the distal internal carotid diameter as the denominator. CONTRAST:  MultiHance. COMPARISON:  Comparison made with prior CT from 01/19/2018. FINDINGS: MRI HEAD FINDINGS Age-related cerebral volume loss. No significant cerebral white matter change for age. Focal area of encephalomalacia with gliosis within the posterior left frontal lobe compatible with remote left MCA territory infarct. Small amount of chronic hemosiderin staining within this region. No abnormal foci of restricted diffusion to suggest acute or subacute ischemia.  Gray-white matter differentiation otherwise maintained. No other areas of chronic infarction. No evidence for acute intracranial hemorrhage. No mass lesion, midline shift or mass effect. No hydrocephalus. No extra-axial fluid collection. Major dural sinuses are grossly patent. Pituitary gland suprasellar region normal. Midline structures intact and normal. Abnormal flow void within the left ICA to the level of the terminus, likely occluded. Major intracranial vascular flow voids otherwise maintained. Diminutive vertebrobasilar system. Craniocervical junction normal. Upper cervical spine within normal limits. Bone marrow signal intensity  normal. No scalp soft tissue abnormality. Globes and orbital soft tissues within normal limits. Scattered mucosal thickening within the ethmoidal air cells. Right maxillary sinus retention cyst. Paranasal sinuses are otherwise clear. Trace opacity bilateral mastoid air cells, of doubtful significance. Inner ear structures normal. MRA HEAD FINDINGS ANTERIOR CIRCULATION: Left ICA occluded to the level of the terminus. Distal cervical right ICA widely patent as is the petrous right ICA. Scattered atheromatous irregularity within the cavernous/supraclinoid ICA. Tandem moderate stenoses at the proximal aspect and anterior genu of the cavernous right ICA (series 9001, image 71, 83). Supraclinoid right ICA otherwise patent to the terminus. Short-segment severe proximal right M1 stenosis (series 9001, image 102). Right M1 bifurcates early. Atheromatous irregularity within proximal right M2 branches without high-grade stenosis. Right MCA branches well perfused distally. Right A1 patent without stenosis. Normal anterior communicating artery. Anterior cerebral arteries well perfused to their distal aspects without high-grade stenosis. Hypoplastic left A1 with superimposed severe stenosis (series [id number], image 9). Left ICA terminus is patent via flow across the circle-of-Willis and from the  posterior circulation. Left MCA is perfused with somewhat attenuated as compared to the right. Left M1 segment patent without stenosis. No proximal left M2 occlusion. Distal left MCA branches well perfused. POSTERIOR CIRCULATION: Vertebral artery somewhat diminutive bilaterally with mild multifocal irregularity without high-grade stenosis. Partially visualized posterior inferior cerebral arteries patent bilaterally. Basilar artery diffusely diminutive with tandem moderate stenoses involving the mid and distal basilar. Superior cerebral arteries patent bilaterally. Fetal type origin of the PCAs supplied via posterior communicating arteries. PCAs demonstrate scattered atheromatous irregularity without high-grade stenosis, and are perfused to their distal aspects. No aneurysm. MRA NECK FINDINGS Source images reviewed. Visualized aortic arch of normal caliber with normal branch pattern. No flow-limiting stenosis about the origin of the great vessels. Visualized subclavian arteries widely patent. Right common carotid artery patent from its origin to the bifurcation. No significant atheromatous stenosis about the right bifurcation. Right ICA patent from the bifurcation to the skull base without stenosis or occlusion. Left common carotid artery patent from its origin to the bifurcation. There is occlusion of the left ICA at the level of the bifurcation, which remains occluded within the neck. Both of the vertebral arteries arise from the subclavian arteries. Right vertebral artery dominant. Suspected at least moderate stenoses at the origin of the vertebral arteries bilaterally. Vertebral arteries otherwise patent within the neck without high-grade stenosis or occlusion. IMPRESSION: MRI HEAD IMPRESSION 1. No acute intracranial abnormality identified. 2. Chronic left posterior MCA infarct. 3. Abnormal flow void within the left ICA, consistent with occlusion. See below. MRA HEAD IMPRESSION 1. Occluded left ICA to the level  of the terminus. Left MCA is perfused via collateral flow cross the circle-of-Willis as well as from the posterior circulation, although flow within the left MCA overall somewhat attenuated as compared to the right. 2. Moderate tandem stenoses involving the proximal and mid cavernous right ICA. 3. Focal severe proximal right M1 stenosis. 4. Focal severe mid left A1 stenosis. 5. Fetal type origin of the PCAs with overall diminutive vertebrobasilar system. Moderate tandem stenoses involving the mid and distal basilar artery. MRA NECK IMPRESSION 1. 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 2. Right carotid artery system widely patent within the neck without stenosis. 3. Suspected at least moderate stenoses at the origin of the vertebral arteries bilaterally. Vertebral arteries otherwise patent within the neck without stenosis or occlusion. Electronically Signed   By: Jeannine Boga M.D.   On:  01/20/2018 06:19   Ct Head Code Stroke Wo Contrast  Result Date: 01/19/2018 CLINICAL DATA:  Code stroke. RIGHT hand numbness and decreased sensation since this afternoon. EXAM: CT HEAD WITHOUT CONTRAST TECHNIQUE: Contiguous axial images were obtained from the base of the skull through the vertex without intravenous contrast. COMPARISON:  None. FINDINGS: BRAIN: No intraparenchymal hemorrhage, mass effect nor midline shift. LEFT anterior parietal lobe encephalomalacia. Mild ex vacuo dilatation LEFT lateral ventricle, borderline parenchymal brain volume loss for age. No hydrocephalus. No acute large vascular territory infarct. No abnormal extra-axial fluid collections. Symmetric basal ganglia mineralization. The basal cisterns are patent. VASCULAR: Moderate to severe calcific atherosclerosis of the carotid siphons. SKULL: No skull fracture. No significant scalp soft tissue swelling. SINUSES/ORBITS: The mastoid air-cells and included paranasal sinuses are well-aerated.The included ocular globes and orbital  contents are non-suspicious. OTHER: Patient is edentulous. ASPECTS [facility] Stroke Program Early CT Score) - Ganglionic level infarction (caudate, lentiform nuclei, internal capsule, insula, M1-M3 cortex): 7 - Supraganglionic infarction (M4-M6 cortex): 3 Total score (0-10 with 10 being normal): 10 IMPRESSION: 1. No acute intracranial process. 2. ASPECTS is 10. 3. Old small LEFT parietal/MCA territory infarct. 4. Critical Value/emergent results were called by telephone at the time of interpretation on 01/19/2018 at 6:05 pm to Dr. Francine Graven , who verbally acknowledged these results. Electronically Signed   By: Elon Alas M.D.   On: 01/19/2018 18:07   Ir Angio Intra Extracran Sel Com Carotid Innominate Bilat Mod Sed  Result Date: 01/22/2018 CLINICAL DATA:  Right-sided intermittent paresthesias, and incoordination. EXAM: IR ANGIO VERTEBRAL SEL VERTEBRAL UNI RIGHT MOD SED; BILATERAL COMMON CAROTID AND INNOMINATE ANGIOGRAPHY; IR ANGIO VERTEBRAL SEL SUBCLAVIAN INNOMINATE UNI LEFT MOD SED COMPARISON:  MRI MRA of the brain of 01/20/2018. MEDICATIONS: Heparin 1000 units IV; no antibiotic was administered within 1 hour of the procedure. ANESTHESIA/SEDATION: Versed 1 mg IV; Fentanyl 25 mcg IV. Moderate Sedation Time:  35 minutes. The patient was continuously monitored during the procedure by the interventional radiology nurse under my direct supervision. CONTRAST:  Isovue 300 approximately 60 mL. FLUOROSCOPY TIME:  Fluoroscopy Time: 8 minutes 40 seconds (807 mGy). COMPLICATIONS: None immediate. TECHNIQUE: Informed written consent was obtained from the patient after a thorough discussion of the procedural risks, benefits and alternatives. All questions were addressed. Maximal Sterile Barrier Technique was utilized including caps, mask, sterile gowns, sterile gloves, sterile drape, hand hygiene and skin antiseptic. A timeout was performed prior to the initiation of the procedure. The right groin was prepped and  draped in the usual sterile fashion. Thereafter using modified Seldinger technique, transfemoral access into the right common femoral artery was obtained without difficulty. Over a 0.035 inch guidewire, a 5 French Pinnacle sheath was inserted. Through this, and also over 0.035 inch guidewire, a 5 [place] JB 1 catheter was advanced to the aortic arch region and selectively positioned in the right common carotid artery, the right vertebral artery, the left common carotid artery and the left vertebral artery. FINDINGS: The right common carotid arteriogram demonstrates the right external carotid artery and its major branches to be widely patent. The right internal carotid artery at the bulb to the cranial skull base opacifies normally. The proximal petrous segment is widely patent. There is approximately 65-70% stenosis of the proximal cavernous segment on the right with post stenotic dilatation. There a tapered narrowing of the supraclinoid right ICA. The right middle cerebral artery demonstrates approximately 50-70% stenosis in its proximal aspect. More distally, the right middle cerebral artery branches opacify  into the capillary and venous phases. The right anterior cerebral artery opacifies into the capillary and venous phases. There is prompt cross filling via the anterior communicating artery of the left anterior cerebral A2 segment and A1 segment. Also demonstrated is prompt opacification via the anterior communicating artery into the left middle cerebral artery distribution mixing with unopacified blood from the left posterior communicating artery and the distal left internal carotid artery. The right posterior communicating artery is widely patent opacifying the right posterior cerebral artery distribution. The right vertebral artery origin is widely patent. The vessel is seen to opacify normally to the cranial skull base. The right vertebrobasilar junction distal to the right posterior-inferior cerebellar  artery demonstrates mild stenosis. There is 50% stenosis of the mid basilar artery at the level of the anterior-inferior cerebellar arteries. Distal to this, the basilar artery is patent. The superior cerebellar arteries are seen to opacify into the capillary and venous phases. Prompt opacification via the left posterior communicating artery of the left middle cerebral artery is noted. Mixing of unopacified blood from the right anterior cerebral artery as described above is noted. The delayed arterial phase demonstrates delayed opacification of the posterior and the anterior temporal region from the thalamo- perforators laterally and medially. The left common carotid arteriogram demonstrates mild atherosclerotic narrowing of the mid left common carotid artery. The left external carotid artery and its major branches are widely patent. The left internal carotid artery at the bulb demonstrates complete angiographic occlusion without evidence of a string sign on the delayed arterial phase. More distally, there is partial reconstitution of a severely diseased supraclinoid left ICA via the ipsilateral ophthalmic artery from the nasolacrimal collaterals, and the distal internal maxillary artery collaterals. Severe stenosis seen of the cavernous segment of the left internal carotid artery is noted. Partial opacification more distally into the supraclinoid left ICA and left middle cerebral artery branches is noted. Mixing with unopacified blood from the right anterior circulation via the anterior communicating artery, and also the left posterior communicating artery is also noted. The left vertebral artery origin demonstrates a 90% plus severe stenosis, with antegrade flow noted more distally to the cranial skull base were it is partially reconstituted with the ascending cervical branch of the thyrocervical trunk. Wide patency of the left posterior inferior cerebellar artery and the left posterior meningeal artery is noted.  Distal flow into the basilar artery and the anterior-inferior cerebellar arteries is noted. Mixing of non-opacified blood is seen from the contralateral right vertebral artery. IMPRESSION: Approximately 65-70% stenosis of the right internal carotid artery and the caval cavernous segment with pre stenotic dilatation. Approximately 50-70% stenosis of the proximal right middle cerebral artery. Approximately 50% stenosis of the mid basilar artery. 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 Severe stenosis of the proximal cavernous segment of the left internal carotid artery. Severe 90% plus stenosis of the origin of the left vertebral artery. PLAN: Will discuss angiographic findings with referring MD. Electronically Signed   By: Luanne Bras M.D.   On: 01/21/2018 14:13   Ir Angio Vertebral Sel Vertebral Bilat Mod Sed  Result Date: 01/22/2018 CLINICAL DATA:  Right-sided intermittent paresthesias, and incoordination. EXAM: IR ANGIO VERTEBRAL SEL VERTEBRAL UNI RIGHT MOD SED; BILATERAL COMMON CAROTID AND INNOMINATE ANGIOGRAPHY; IR ANGIO VERTEBRAL SEL SUBCLAVIAN INNOMINATE UNI LEFT MOD SED COMPARISON:  MRI MRA of the brain of 01/20/2018. MEDICATIONS: Heparin 1000  units IV; no antibiotic was administered within 1 hour of the procedure. ANESTHESIA/SEDATION: Versed 1 mg IV; Fentanyl 25 mcg IV. Moderate Sedation Time:  35 minutes. The patient was continuously monitored during the procedure by the interventional radiology nurse under my direct supervision. CONTRAST:  Isovue 300 approximately 60 mL. FLUOROSCOPY TIME:  Fluoroscopy Time: 8 minutes 40 seconds (807 mGy). COMPLICATIONS: None immediate. TECHNIQUE: Informed written consent was obtained from the patient after a thorough discussion of the procedural risks, benefits and alternatives. All questions were  addressed. Maximal Sterile Barrier Technique was utilized including caps, mask, sterile gowns, sterile gloves, sterile drape, hand hygiene and skin antiseptic. A timeout was performed prior to the initiation of the procedure. The right groin was prepped and draped in the usual sterile fashion. Thereafter using modified Seldinger technique, transfemoral access into the right common femoral artery was obtained without difficulty. Over a 0.035 inch guidewire, a 5 French Pinnacle sheath was inserted. Through this, and also over 0.035 inch guidewire, a 5 [place] JB 1 catheter was advanced to the aortic arch region and selectively positioned in the right common carotid artery, the right vertebral artery, the left common carotid artery and the left vertebral artery. FINDINGS: The right common carotid arteriogram demonstrates the right external carotid artery and its major branches to be widely patent. The right internal carotid artery at the bulb to the cranial skull base opacifies normally. The proximal petrous segment is widely patent. There is approximately 65-70% stenosis of the proximal cavernous segment on the right with post stenotic dilatation. There a tapered narrowing of the supraclinoid right ICA. The right middle cerebral artery demonstrates approximately 50-70% stenosis in its proximal aspect. More distally, the right middle cerebral artery branches opacify into the capillary and venous phases. The right anterior cerebral artery opacifies into the capillary and venous phases. There is prompt cross filling via the anterior communicating artery of the left anterior cerebral A2 segment and A1 segment. Also demonstrated is prompt opacification via the anterior communicating artery into the left middle cerebral artery distribution mixing with unopacified blood from the left posterior communicating artery and the distal left internal carotid artery. The right posterior communicating artery is widely patent opacifying  the right posterior cerebral artery distribution. The right vertebral artery origin is widely patent. The vessel is seen to opacify normally to the cranial skull base. The right vertebrobasilar junction distal to the right posterior-inferior cerebellar artery demonstrates mild stenosis. There is 50% stenosis of the mid basilar artery at the level of the anterior-inferior cerebellar arteries. Distal to this, the basilar artery is patent. The superior cerebellar arteries are seen to opacify into the capillary and venous phases. Prompt opacification via the left posterior communicating artery of the left middle cerebral artery is noted. Mixing of unopacified blood from the right anterior cerebral artery as described above is noted. The delayed arterial phase demonstrates delayed opacification of the posterior and the anterior temporal region from the thalamo- perforators laterally and medially. The left common carotid arteriogram demonstrates mild atherosclerotic narrowing of the mid left common carotid artery. The left external carotid artery and its major branches are widely patent. The left internal carotid artery at the bulb demonstrates complete angiographic occlusion without evidence of a string sign on the delayed arterial phase. More distally, there is partial reconstitution of a severely diseased supraclinoid left ICA via the ipsilateral ophthalmic artery from the nasolacrimal collaterals, and the distal internal maxillary artery collaterals. Severe stenosis seen of the cavernous segment of the left  internal carotid artery is noted. Partial opacification more distally into the supraclinoid left ICA and left middle cerebral artery branches is noted. Mixing with unopacified blood from the right anterior circulation via the anterior communicating artery, and also the left posterior communicating artery is also noted. The left vertebral artery origin demonstrates a 90% plus severe stenosis, with antegrade flow  noted more distally to the cranial skull base were it is partially reconstituted with the ascending cervical branch of the thyrocervical trunk. Wide patency of the left posterior inferior cerebellar artery and the left posterior meningeal artery is noted. Distal flow into the basilar artery and the anterior-inferior cerebellar arteries is noted. Mixing of non-opacified blood is seen from the contralateral right vertebral artery. IMPRESSION: Approximately 65-70% stenosis of the right internal carotid artery and the caval cavernous segment with pre stenotic dilatation. Approximately 50-70% stenosis of the proximal right middle cerebral artery. Approximately 50% stenosis of the mid basilar artery. 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 Severe stenosis of the proximal cavernous segment of the left internal carotid artery. Severe 90% plus stenosis of the origin of the left vertebral artery. PLAN: Will discuss angiographic findings with referring MD. Electronically Signed   By: Luanne Bras M.D.   On: 01/21/2018 14:13    Labs:  CBC: Recent Labs    01/19/18 1802 01/19/18 1811 01/20/18 0247 01/21/18 0254 01/22/18 0335  WBC 2.5*  --  2.7* 2.7* 2.6*  HGB 13.4 13.3 12.6* 13.1 13.1  HCT 37.7* 39.0 35.5* 37.4* 37.0*  PLT 87*  --  88* 89* 91*    COAGS: Recent Labs    01/19/18 1802 01/21/18 0254  INR 1.08 1.14  APTT 27  --     BMP: Recent Labs    01/19/18 1802 01/19/18 1811 01/21/18 0254 01/22/18 0335  NA 133* 138 140 141  K 3.6 3.6 4.0 3.5  CL 100* 101 107 108  CO2 26  --  26 25  GLUCOSE 233* 226* 244* 154*  BUN 20 20 10 9   CALCIUM 9.1  --  8.5* 8.6*  CREATININE 0.69 0.70 0.68 0.62  GFRNONAA >60  --  >60 >60  GFRAA >60  --  >60 >60    LIVER FUNCTION TESTS: Recent Labs    01/19/18 1802  BILITOT 1.4*  AST 17  ALT  17  ALKPHOS 49  PROT 7.0  ALBUMIN 4.1    TUMOR MARKERS: No results for input(s): AFPTM, CEA, CA199, CHROMGRNA in the last 8760 hours.  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 Reviewed imaging with patient. Discussed diabetic/glycemic control with patient. States that his blood sugars have been running in the 300s since discharge. States he has seen his PCP for this, and is currently on 3 medications including insulin. Explained the importance of diabetic/glycemic control with patient, and how symptoms of hyperglycemia can mimic stroke symptoms. Because of this, explained to patient that no intervention will occur until he has his blood sugars under control. Recommended patient see PCP ASAP and ask to be referred to an endocrinologist. Until patient has his blood sugars controlled, explained that the best course of management for patient's left vertebral artery and right ICA stenosis is with routine imaging scans.  Plan for follow-up in 4 months with CTA head or MRA head- depending on patient's kidney function (do not want to administer contrast  for CTA if kidney function is poor). Informed patient that our schedulers will call him to set up this scan. Instructed patient to continue taking Plavix 75 mg once daily and Aspirin 325 mg once daily. Instructed patient to follow-up with neurology and PCP regularly.  All questions answered and concerns addressed. Patient conveys understanding and agrees with plan.  Thank you for this interesting consult.  I greatly enjoyed meeting Yadiel Aubry and look forward to participating in their care.  A copy of this report was sent to the requesting provider on this date.  Electronically Signed: Earley Abide, PA-C 01/29/2018, 9:24 AM   I spent a total of 25 Minutes in face to face in clinical consultation, greater than 50% of which was counseling/coordinating care for left vertebral artery stenosis  AND right ICA cavernous segment stenosis.

## 2018-01-30 ENCOUNTER — Other Ambulatory Visit: Payer: Self-pay

## 2018-01-30 ENCOUNTER — Encounter (HOSPITAL_COMMUNITY): Payer: Self-pay | Admitting: Specialist

## 2018-01-30 ENCOUNTER — Ambulatory Visit (HOSPITAL_COMMUNITY): Payer: BLUE CROSS/BLUE SHIELD | Admitting: Specialist

## 2018-01-30 ENCOUNTER — Ambulatory Visit (HOSPITAL_COMMUNITY): Payer: BLUE CROSS/BLUE SHIELD

## 2018-01-30 ENCOUNTER — Encounter (HOSPITAL_COMMUNITY): Payer: Self-pay

## 2018-01-30 VITALS — BP 123/71 | HR 70

## 2018-01-30 DIAGNOSIS — R2689 Other abnormalities of gait and mobility: Secondary | ICD-10-CM

## 2018-01-30 DIAGNOSIS — M6281 Muscle weakness (generalized): Secondary | ICD-10-CM

## 2018-01-30 DIAGNOSIS — R29898 Other symptoms and signs involving the musculoskeletal system: Secondary | ICD-10-CM

## 2018-01-30 NOTE — Therapy (Signed)
[place], [place], [address] Phone: [phone]   Fax:  [phone]  Physical Therapy Treatment  Patient Details  Name: Mark Foster MRN: [id number] Date of Birth: 1956-02-24 Referring Provider: Rosalin Hawking, MD   Encounter Date: 01/30/2018  PT End of Session - 01/30/18 1029    Visit Number  2    Number of Visits  7    Date for PT Re-Evaluation  02/14/18    Authorization Type  [employer] [place] (30 visit limit - calender year - PT/OT/Chiro combined)    Authorization Time Period  01/24/18-02/14/18    Authorization - Visit Number  2    Authorization - Number of Visits  6    PT Start Time  [phone]    PT Stop Time  1028    PT Time Calculation (min)  40 min    Equipment Utilized During Treatment  Gait belt    Activity Tolerance  Patient tolerated treatment well    Behavior During Therapy  [facility] for tasks assessed/performed       Past Medical History:  Diagnosis Date  . Cervical compression fracture ([place])   . Cervical spinal stenosis   . Stroke [facility])    2011  . Type 2 diabetes mellitus ([place])     Past Surgical History:  Procedure Laterality Date  . IR ANGIO INTRA EXTRACRAN SEL COM CAROTID INNOMINATE BILAT MOD SED  01/21/2018  . IR ANGIO VERTEBRAL SEL VERTEBRAL BILAT MOD SED  01/21/2018    Vitals:   01/30/18 1043  BP: 123/71  Pulse: 70   Blood sugar: 229 mg/dL  Subjective Assessment - 01/30/18 0952    Subjective  Pt stated he is feeling good today, tired following OT eval.  Reports blood sugar levels at 229 mg/dL today.      Currently in Pain?  No/denies         [facility] PT Assessment - 01/30/18 0001      Assessment   Medical Diagnosis  L MCA Stroke-like episode    Referring Provider  Rosalin Hawking, MD    Onset Date/Surgical Date  01/19/18    Prior Therapy  Yes, ~ 1 year ago after stroke      Precautions   Precautions  None    Precaution Comments  Monitor blood sugar      ROM / Strength   AROM /  PROM / Strength  Strength      Strength   Strength Assessment Site  Hip;Knee;Ankle    Right/Left Hip  Right;Left    Right Hip Flexion  3/5    Right Hip Extension  2+/5    Right Hip ABduction  3/5    Left Hip Flexion  3+/5    Left Hip Extension  2+/5    Left Hip ABduction  3+/5    Right/Left Knee  Right;Left    Right Knee Flexion  3/5    Right Knee Extension  3+/5    Left Knee Flexion  3/5    Left Knee Extension  3+/5    Right/Left Ankle  Right;Left    Right Ankle Dorsiflexion  3/5    Right Ankle Plantar Flexion  2+/5    Left Ankle Dorsiflexion  3/5    Left Ankle Plantar Flexion  2+/5      Ambulation/Gait   Ambulation/Gait  Yes    Ambulation/Gait Assistance  6: Modified independent (Device/Increase time)    Ambulation Distance (Feet)  307 Feet 2MWT  Assistive device  Straight cane    Gait Pattern  Step-through pattern    Ambulation Surface  Level    Pre-Gait Activities  2MWT; will not walk with SPC in Lt UE    Gait Comments  Patient with reliance on [facility] for balcne with standing and ambulation      Standardized Balance Assessment   Standardized Balance Assessment  Dynamic Gait Index      Dynamic Gait Index   Level Surface  Mild Impairment    Change in Gait Speed  Moderate Impairment    Gait with Horizontal Head Turns  Moderate Impairment    Gait with Vertical Head Turns  Severe Impairment c/o dizziness with vertical head turns    Gait and Pivot Turn  Moderate Impairment    Step Over Obstacle  Mild Impairment    Step Around Obstacles  Moderate Impairment    Steps  Mild Impairment    Total Score  10    DGI comment:  10/24 use of SPC in Rt UE                   [facility] Adult PT Treatment/Exercise - 01/30/18 0001      Exercises   Exercises  Knee/Hip      Knee/Hip Exercises: Seated   Long Arc Quad  5 reps    Sit to [employer]  5 reps;without UE support HHA descending             PT Education - 01/30/18 1045    Education provided  Yes    Education  Details  Reviewed goals, established HEP, copy of eval given to pt.  Measured vitals with blood sugar at 229 mg/dL and BP at 123/71 mmHg.  Educated on "FAST" acronym for signs/symptoms for stroke.    Person(s) Educated  Patient    Methods  Explanation;Demonstration;Verbal cues;Handout    Comprehension  Verbalized understanding;Returned demonstration;Need further instruction       PT Short Term Goals - 01/24/18 1615      PT SHORT TERM GOAL #1   Title  Patient will verbalize meaning of "FAST" for stroke signs/symptoms and be able to list 4 modifiable risk factors for stroke to reduce risk of stroke.     Time  2    Period  Weeks    Status  New    Target Date  02/07/18        PT Long Term Goals - 01/24/18 1617      PT LONG TERM GOAL #1   Title  Patient will perform 2MWT with LRAD at = or > 1.0 m/s to improve safety with commnity ambulation and demonstrate decreased fall risk.    Time  3    Period  Weeks    Status  New    Target Date  02/14/18      PT LONG TERM GOAL #2   Title  Patient will perform SLS for Bil LE for 15 seconds each to demonstrate improved balance and safety with gait and stair mobility to be able to transition to ambulation with no device.    Time  3    Period  Weeks    Status  New            Plan - 01/30/18 1047    Clinical Impression Statement  Reviewed goals, established HEP and copy of eval given to pt.  Continued further objective measurements since pt.'s blood sugar levels were 229 mg/dL and measured vitals PRN.  MMT,  DGI, 2MWT complete this session.  Established HEP to address some of the weak muscualture.  Pt able to demonstrate appropraite mechanics with all exercises.  Pt educated on benefits of ambulatin with SPC in Lt UE, pt declined trial in opposite UE to assist with gait mechanics.  Pt very unstable without AD and increase risk of fall.  Pt educated with s/s of stroke with "FAST" acronyms to improve awareness of symptoms.      Rehab Potential   Good    PT Frequency  2x / week    PT Duration  3 weeks    PT Treatment/Interventions  ADLs/Self Care Home Management;DME Instruction;Gait training;Stair training;Functional mobility training;Therapeutic activities;Therapeutic exercise;Balance training;Neuromuscular re-education;Patient/family education    PT Next Visit Plan  Check blood sugar (do not continue exercise if above 300). Review compliance wiht HEP and progress functional strengthening.  Begin balance training as appropriate and monitor BP and HR. Review "FAST" and educate on stroke risk reduction factors.    PT Home Exercise Plan  5/29: bridge, STS, heel raise and LAQ       Patient will benefit from skilled therapeutic intervention in order to improve the following deficits and impairments:  Abnormal gait, Postural dysfunction, Improper body mechanics, Decreased mobility, Cardiopulmonary status limiting activity, Decreased activity tolerance, Decreased strength, Decreased endurance, Decreased balance  Visit Diagnosis: Muscle weakness (generalized)  Other abnormalities of gait and mobility     Problem List Patient Active Problem List   Diagnosis Date Noted  . Intracranial atherosclerosis 01/22/2018  . Spinal stenosis in cervical region   . Diabetes mellitus type 2 in nonobese (HCC)   . Left carotid artery occlusion 01/21/2018  . Cervical stenosis of spinal canal 01/21/2018  . Right hand weakness 01/21/2018  . Numbness of right hand 01/21/2018  . Essential hypertension 01/21/2018  . Hyperlipidemia 01/21/2018  . Diabetes ([place]) 01/21/2018  . Hx of completed stroke 01/21/2018  . Leukopenia 01/21/2018  . Thrombocytopenia ([place]) 01/21/2018  . 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 [place], [place], [address] Phone: [phone]   Fax:   [phone]  Name: Mark Foster MRN: [id number] Date of Birth: [birth year]

## 2018-01-30 NOTE — Patient Instructions (Signed)
Home Exercises Program Theraputty Exercises  Do the following exercises 2-3 times a day using your affected hand.  1. Roll putty into a ball.  Squeeze.  2. Make into a pancake.  3. Roll putty into a roll.  4. Pinch along log with first finger and thumb.   5. Make into a ball.  6. Roll it back into a log.   7. Pinch using thumb and side of first finger.  8. Roll into a ball, then flatten into a pancake.  9. Using your fingers, make putty into a mountain.   Coordination Activities  Perform the following activities for 5-10 minutes 2-3 times per day with left hand(s).   Rotate ball in fingertips (clockwise and counter-clockwise).  Toss ball between hands.  Toss ball in air and catch with the same hand.  Flip cards 1 at a time as fast as you can.  Deal cards with your thumb (Hold deck in hand and push card off top with thumb).  Rotate card in hand (clockwise and counter-clockwise).  Shuffle cards.  Pick up coins, buttons, marbles, dried beans/pasta of different sizes and place in container.  Pick up coins and place in container or coin bank.  Pick up coins and stack.  Pick up coins one at a time until you get 5-10 in your hand, then move coins from palm to fingertips to stack one at a time.  Twirl pen between fingers.  Practice writing and/or typing.  Screw together nuts and bolts, then unfasten.

## 2018-01-30 NOTE — Patient Instructions (Signed)
Functional Quadriceps: Sit to Stand    Sit on edge of chair, feet flat on floor. Stand upright, extending knees fully. Repeat 5 times per set. Do 2 sets per session. Do 4 sessions per day.  http://orth.exer.us/734   Copyright  VHI. All rights reserved.   Toe / Heel Raise (Standing)    Standing with support, raise heels, then rock back on heels and raise toes. Repeat 5-10 times.  Copyright  VHI. All rights reserved.   Long [employer]    Straighten operated leg and try to hold it 3-5 seconds. Repeat 5-10 times. Do 2 sessions a day.  http://gt2.exer.us/310   Copyright  VHI. All rights reserved.   Bridging    Slowly raise buttocks from floor, keeping stomach tight. Repeat 5-10 times per set. Do 2 sets per session.   http://orth.exer.us/1096   Copyright  VHI. All rights reserved.

## 2018-01-30 NOTE — Therapy (Signed)
[place], [place], [address] Phone: [phone]   Fax:  [phone]  Occupational Therapy Evaluation  Patient Details  Name: Mark Foster MRN: [id number] Date of Birth: May 16, 1956 Referring Provider: Dr. Rosalin Hawking   Encounter Date: 01/30/2018  OT End of Session - 01/30/18 1429    Visit Number  1    Number of Visits  1    Authorization Type  30 visits per year combined with PT    Authorization - Visit Number  3    Authorization - Number of Visits  30    OT Start Time  0815    OT Stop Time  0900    OT Time Calculation (min)  45 min    Activity Tolerance  Patient tolerated treatment well    Behavior During Therapy  [facility] for tasks assessed/performed       Past Medical History:  Diagnosis Date  . Cervical compression fracture ([place])   . Cervical spinal stenosis   . Stroke [facility])    2011  . Type 2 diabetes mellitus ([place])     Past Surgical History:  Procedure Laterality Date  . IR ANGIO INTRA EXTRACRAN SEL COM CAROTID INNOMINATE BILAT MOD SED  01/21/2018  . IR ANGIO VERTEBRAL SEL VERTEBRAL BILAT MOD SED  01/21/2018    There were no vitals filed for this visit.  Subjective Assessment - 01/30/18 1356    Subjective   S: I want to get my strength back.  I would like to be able to ride my motorcycle again.    Pertinent History  He reports his problems began ~ in February/March of 2018 when he was living in [place]. 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 He was at work one day and states when he climbed up a ladder he felt very light headed and weak. He reports when he told his doctor they called him in immediately to evaluate him. They sent him to the hospital and he spent about 2 weeks in the hospital. He states they found his Lt carotid artery is almost completely blocked and he has blockages in his vertebral artery from cervical spinal stenosis.  After his 2 weeks in the hospital he worked with physical therapy and was initially walking with a RW but by April 2018 was using a SPC. His recent episode last weekend started on 5/11 when he was camping with a friend. He reports after paddling boats for an hour or so he got back to the camp site and tried to swat a bug away. This is when he noticed he could lift his arm fully and his Rt hand was closed and he couldn't open it. He states his friend brought him into the hospital at [facility] and he was discharged 5/14. He reports feeling like his Rt hand is about 95% back to what it was strength wise before he had his recent episode and he is walking about the same as he has been since finishing therapy in [place]    Patient Stated Goals  I want the strength back in my hand    Currently in Pain?  No/denies        [facility] OT Assessment - 01/30/18 1437      Assessment   Medical Diagnosis  L MCA Stroke-like episode    Referring Provider  Dr. Rosalin Hawking    Onset Date/Surgical Date  01/19/18  Precautions   Precautions  None    Precaution Comments  Monitor blood sugar      Balance Screen   Has the patient fallen in the past 6 months  No    Has the patient had a decrease in activity level because of a fear of falling?   No    Is the patient reluctant to leave their home because of a fear of falling?   No      Home  Environment   Family/patient expects to be discharged to:  Private residence    Lives With  [place]  Retired;On disability    Leisure  campinng, riding bike, fishing      ADL   ADL comments  difficulty gripping houshold items       Written Expression   Dominant Hand  Right      Cognition   Overall Cognitive Status  Within Functional Limits for tasks assessed      Sensation   Light Touch  Appears Intact      Coordination   9 Hole Peg Test  Right;Left    Right 9 Hole Peg Test  23.32"    Left 9  Hole Peg Test  21.25"    Box and Blocks  '      ROM / Strength   AROM / PROM / Strength  AROM;Strength      AROM   Overall AROM Comments  BUE A/ROM is WNL      Strength   Overall Strength Comments  BUE strength is WNL      Hand Function   Right Hand Grip (lbs)  58    Right Hand Lateral Pinch  18 lbs    Right Hand 3 Point Pinch  11 lbs    Left Hand Grip (lbs)  65    Left Hand Lateral Pinch  10 lbs    Left 3 point pinch  11 lbs                      OT Education - 01/30/18 1400    Education provided  Yes  (Pended)     Education Details  Educated patient on HEP for grip strengthening and fine motor coordination  (Pended)     Person(s) Educated  Patient  (Pended)     Methods  Explanation;Demonstration;Handout  (Pended)     Comprehension  Verbalized understanding;Returned demonstration  (Pended)        OT Short Term Goals - 01/30/18 1435      OT SHORT TERM GOAL #1   Title  Patient will be educated on an HEP for right hand grip and pinch strengthening and coordination training.    Time  1    Period  Weeks    Status  Achieved    Target Date  02/06/18               Plan - 01/30/18 1433    Clinical Impression Statement  Patient is a 62 year old male with recent stroke like event,causing weakness and decreased coordination in his dominant right hand.  Patient has mild grip, pinch, and coordination deficits upon evaluation in his right hand this date.  Patient will be able to address deficits with a HEP versus skilled OT intervention.      Occupational Profile and client history currently impacting functional performance  age and motivation  Occupational performance deficits (Please refer to evaluation for details):  ADL's    Rehab Potential  Excellent    OT Frequency  One time visit    OT Treatment/Interventions  Self-care/ADL training;Patient/family education    Plan  P:  DC from skilled OT intervention this date with a HEP for right hand grip and pinch  strengthening and fine motor coordination training.     Clinical Decision Making  Limited treatment options, no task modification necessary    Consulted and Agree with Plan of Care  Patient       Patient will benefit from skilled therapeutic intervention in order to improve the following deficits and impairments:  Decreased strength, Decreased coordination  Visit Diagnosis: Other symptoms and signs involving the musculoskeletal system - Plan: Ot plan of care cert/re-cert    Problem List Patient Active Problem List   Diagnosis Date Noted  . Intracranial atherosclerosis 01/22/2018  . Spinal stenosis in cervical region   . Diabetes mellitus type 2 in nonobese (HCC)   . Left carotid artery occlusion 01/21/2018  . Cervical stenosis of spinal canal 01/21/2018  . Right hand weakness 01/21/2018  . Numbness of right hand 01/21/2018  . Essential hypertension 01/21/2018  . Hyperlipidemia 01/21/2018  . Diabetes ([place]) 01/21/2018  . Hx of completed stroke 01/21/2018  . Leukopenia 01/21/2018  . Thrombocytopenia ([place]) 01/21/2018  . 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

## 2018-01-31 ENCOUNTER — Other Ambulatory Visit: Payer: Self-pay | Admitting: *Deleted

## 2018-01-31 NOTE — Patient Outreach (Addendum)
[place] [facility]) Care Management  01/31/2018  Mark Foster [birth year] [id number]   RED ON EMMI ALERT Day # 6 Date: 01/28/18 Red Alert Reason: Feeling worse overall Yes Questions/problems with meds Yes   Outreach attempt # 2 Patient is able to verify HIPAA  Reviewed and addressed red alert with patient [name] informed THN CM he his red alert  response was related to an increase in his blood sugars.  He reports his cbg was ranging from 250-330 until he spoke with his MD recently about a possible referral to an endocrinologist. His MD prefers him to have dietary and medication changes at this time. His Lantus is now 15 units (an increase of 5 units per pt) and he takes it "in the mornings"  Since changes have been made,he reports cbgs ranges from 69-286 ton last night. He reports a cbg of 69 on last night around 11 pm after "not feeling well."  He reports eating yogurt with blueberries and re checking at about 4 am to obtain a cbg of 286. CM discussed sending EMMI dietary resources to assist him with dietary changes.  He confirms he does not take any meal insulin coverage.  His last noted HgA1c was listed as 9.7 on 01/20/18.  He reports paying $76/month for his lantus at [facility] which "is expensive."  Appointments- [name] voiced a concern with transportation to an upcoming appointment on 02/04/18.  He reports he may be able to drive himself but will not be able to drive from the appointment. He has called and spoken to Caryl Pina at his MD office today and is pending a return call to clarify. Cm encouraged [name] to call MD office again today to get clarity about his upcoming procedure scheduled on 02/04/18   Plans RN CM will follow up with [name] within in 2 business days RN CM will send [name] EMMI print outs on carbohydrate counting diet and diabetic meal planning with assistance of [facility] CMA. A video was sent to his listed e mail address for diabetes nutrition and  healthy eating CM will review insulin concern with [facility] pharmacist    [name]. Lavina Hamman, RN, BSN, CCM [facility] Telephonic Care Management Care Coordinator Direct number [phone]  [facility] number [phone] Fax number [phone]

## 2018-02-01 ENCOUNTER — Ambulatory Visit (HOSPITAL_COMMUNITY): Payer: BLUE CROSS/BLUE SHIELD

## 2018-02-01 ENCOUNTER — Other Ambulatory Visit: Payer: Self-pay | Admitting: *Deleted

## 2018-02-01 DIAGNOSIS — R2689 Other abnormalities of gait and mobility: Secondary | ICD-10-CM | POA: Diagnosis not present

## 2018-02-01 DIAGNOSIS — R29898 Other symptoms and signs involving the musculoskeletal system: Secondary | ICD-10-CM

## 2018-02-01 DIAGNOSIS — M6281 Muscle weakness (generalized): Secondary | ICD-10-CM

## 2018-02-01 NOTE — Therapy (Signed)
[place], [place], [address] Phone: [phone]   Fax:  [phone]  Physical Therapy Treatment  Patient Details  Name: Mark Foster MRN: [id number] Date of Birth: 03-03-1956 Referring Provider: Dr. Rosalin Hawking   Encounter Date: 02/01/2018  PT End of Session - 02/01/18 0806    Visit Number  3    Number of Visits  7    Date for PT Re-Evaluation  02/14/18    Authorization Type  [employer] [place] (30 visit limit - calender year - PT/OT/Chiro combined)    Authorization Time Period  01/24/18-02/14/18    Authorization - Visit Number  3    Authorization - Number of Visits  6    PT Start Time  0814    PT Stop Time  0857    PT Time Calculation (min)  43 min    Equipment Utilized During Treatment  Gait belt    Activity Tolerance  Patient tolerated treatment well    Behavior During Therapy  [facility] for tasks assessed/performed       Past Medical History:  Diagnosis Date  . Cervical compression fracture ([place])   . Cervical spinal stenosis   . Stroke [facility])    2011  . Type 2 diabetes mellitus ([place])     Past Surgical History:  Procedure Laterality Date  . IR ANGIO INTRA EXTRACRAN SEL COM CAROTID INNOMINATE BILAT MOD SED  01/21/2018  . IR ANGIO VERTEBRAL SEL VERTEBRAL BILAT MOD SED  01/21/2018    There were no vitals filed for this visit.  Subjective Assessment - 02/01/18 0814    Subjective  Pt states that he is having an average morning. His current blood sugar is 139mg /dL.     Currently in Pain?  No/denies             [facility] Adult PT Treatment/Exercise - 02/01/18 0001      Knee/Hip Exercises: Standing   Heel Raises  Both;10 reps;Limitations    Heel Raises Limitations  heel and toe    Hip Abduction  Both;10 reps    Abduction Limitations  no resistance    Rocker Board  2 minutes;Limitations    Rocker Board Limitations  A/P, R/L      Knee/Hip Exercises: Seated   Long Arc Quad  Both;2 sets;10 reps    Sit  to [employer]  2 sets;5 reps intermittnet UE support      Knee/Hip Exercises: Supine   Bridges  Both;2 sets;10 reps      Knee/Hip Exercises: Sidelying   Clams  BLE, 2x10 reps, no resistance            PT Education - 02/01/18 [id number]    Education provided  Yes    Education Details  reviewed HEP; exercise technique; importance of controlling blood sugar    Person(s) Educated  Patient    Methods  Explanation;Demonstration    Comprehension  Verbalized understanding;Returned demonstration       PT Short Term Goals - 01/24/18 1615      PT SHORT TERM GOAL #1   Title  Patient will verbalize meaning of "FAST" for stroke signs/symptoms and be able to list 4 modifiable risk factors for stroke to reduce risk of stroke.     Time  2    Period  Weeks    Status  New    Target Date  02/07/18        PT Long Term Goals - 01/24/18 1617  PT LONG TERM GOAL #1   Title  Patient will perform 2MWT with LRAD at = or > 1.0 m/s to improve safety with commnity ambulation and demonstrate decreased fall risk.    Time  3    Period  Weeks    Status  New    Target Date  02/14/18      PT LONG TERM GOAL #2   Title  Patient will perform SLS for Bil LE for 15 seconds each to demonstrate improved balance and safety with gait and stair mobility to be able to transition to ambulation with no device.    Time  3    Period  Weeks    Status  New            Plan - 02/01/18 0857    Clinical Impression Statement  Pt's blood sugar levels much improved this date as it was 139mg /dL and his BP was 106/60. Continued with established POC focusing on BLE and functional strengthening. Added sidelying clams, standing hip abd, and began balance training this date. He was very challenged with the rockerboard, especially A/P direction. Pt limited due to weakness and mild fatigue. No pain reported during or after session. Continue as planned, progressing as tolerated.     Rehab Potential  Good    PT Frequency  2x / week     PT Duration  3 weeks    PT Treatment/Interventions  ADLs/Self Care Home Management;DME Instruction;Gait training;Stair training;Functional mobility training;Therapeutic activities;Therapeutic exercise;Balance training;Neuromuscular re-education;Patient/family education    PT Next Visit Plan  Check blood sugar (do not continue exercise if above 300). Review compliance wiht HEP and progress functional strengthening.  continue balance training as appropriate and monitor BP and HR. Review "FAST" and educate on stroke risk reduction factors.    PT Home Exercise Plan  5/29: bridge, STS, heel raise and LAQ    Consulted and Agree with Plan of Care  Patient       Patient will benefit from skilled therapeutic intervention in order to improve the following deficits and impairments:  Abnormal gait, Postural dysfunction, Improper body mechanics, Decreased mobility, Cardiopulmonary status limiting activity, Decreased activity tolerance, Decreased strength, Decreased endurance, Decreased balance  Visit Diagnosis: Other symptoms and signs involving the musculoskeletal system  Muscle weakness (generalized)  Other abnormalities of gait and mobility     Problem List Patient Active Problem List   Diagnosis Date Noted  . Intracranial atherosclerosis 01/22/2018  . Spinal stenosis in cervical region   . Diabetes mellitus type 2 in nonobese (HCC)   . Left carotid artery occlusion 01/21/2018  . Cervical stenosis of spinal canal 01/21/2018  . Right hand weakness 01/21/2018  . Numbness of right hand 01/21/2018  . Essential hypertension 01/21/2018  . Hyperlipidemia 01/21/2018  . Diabetes ([place]) 01/21/2018  . Hx of completed stroke 01/21/2018  . Leukopenia 01/21/2018  . Thrombocytopenia ([place]) 01/21/2018  . 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 [place], [place], [address] Phone:  [phone]   Fax:  [phone]  Name: Mark Foster MRN: [id number] Date of Birth: 29-Jul-1956

## 2018-02-01 NOTE — Patient Outreach (Signed)
[facility]) Care Management  02/01/2018  Jervon Ream March 14, 1956 [id number]   RED ON EMMI ALERT Day #9 Date:01/31/18 Red Alert Reason: Questions/problems with meds? yes  Outreach attempt # 1 Patient is able to verify HIPAA  Reviewed and addressed red alertwith patient Mr Lierman informed [facility] CM he his red alert  response was related to his cost of his insulin that was discussed with [facility] CM on 01/31/18 -  He reports paying $76/month for his lantus at [facility] which "is expensive."  CM followed up on his DM cbgs. He reports a decrease in his blood sugars in the last 24 hours 139 (before breakfast) -156 (after breakfast) today .  His dietary and medication changes are providing improvements at this time. CM updated Mr Man that he was sent an EMMI video on DM nutrition and healthy eating to the listed e mail site in [place]. He confirmed the e-mail site to be correct. CM informed him that EMMI print outs on carbohydrate counting diet and diabetic meal planning were mailed to  Him.  Questions answered today related to meats, bread and carb counting. He reports he is unable to afford to go to a nutritional consult related to his limited income.  CM provided him with the contact number for free DM education/nutrition program at [facility]) His last noted HgA1c was listed as 9.7 on 01/20/18.    Medications -CM had consulted with [facility] pharmacist, K R on 5/301/9. CM updated Mr Heckard the reason why his Lantus 15 units is at $76.  Cm encouraged him to contact [employer] [facility] [employer] pharmacy contact number on his insurance card for possible suggestions with decrease cost.  Mr cryder confirms he is on a limited budget.    Appointment- Mr Valvo's upcoming appointment on 02/04/18 has been resolved he received a call from his MD's staff, Anderson Malta, to cancel related to an elevated BP.   Plans RN CM close case as no further needs have been identified for  Mr Pettigrew today   Joelene Millin L. Lavina Hamman, RN, BSN, CCM [facility] Telephonic Care Management Care Coordinator Direct number [phone]  [facility] number [phone] Fax number [phone]

## 2018-02-04 ENCOUNTER — Encounter (HOSPITAL_COMMUNITY): Payer: Self-pay

## 2018-02-04 ENCOUNTER — Ambulatory Visit (HOSPITAL_COMMUNITY)
Admission: RE | Admit: 2018-02-04 | Payer: BLUE CROSS/BLUE SHIELD | Source: Ambulatory Visit | Admitting: Interventional Radiology

## 2018-02-04 ENCOUNTER — Ambulatory Visit (HOSPITAL_COMMUNITY): Admission: RE | Admit: 2018-02-04 | Payer: BLUE CROSS/BLUE SHIELD | Source: Ambulatory Visit

## 2018-02-04 SURGERY — IR WITH ANESTHESIA
Anesthesia: General

## 2018-02-11 ENCOUNTER — Other Ambulatory Visit: Payer: Self-pay

## 2018-02-11 ENCOUNTER — Ambulatory Visit (HOSPITAL_COMMUNITY): Payer: BLUE CROSS/BLUE SHIELD | Attending: Neurology

## 2018-02-11 ENCOUNTER — Encounter (HOSPITAL_COMMUNITY): Payer: Self-pay

## 2018-02-11 DIAGNOSIS — R2689 Other abnormalities of gait and mobility: Secondary | ICD-10-CM

## 2018-02-11 DIAGNOSIS — M6281 Muscle weakness (generalized): Secondary | ICD-10-CM

## 2018-02-11 DIAGNOSIS — R29898 Other symptoms and signs involving the musculoskeletal system: Secondary | ICD-10-CM | POA: Insufficient documentation

## 2018-02-11 NOTE — Therapy (Addendum)
[place], [place], [address] Phone: [phone]   Fax:  [phone]  Physical Therapy Treatment/Progress Note  Patient Details  Name: Mark Foster MRN: [id number] Date of Birth: [birth year] Referring Provider: Rosalin Hawking, MD   Encounter Date: 02/11/2018   Progress Note Reporting Period 01/24/18 to 02/11/18  See note below for Objective Data and Assessment of Progress/Goals.    PT End of Session - 02/11/18 0920    Visit Number  4    Number of Visits  11    Date for PT Re-Evaluation  02/14/18    Authorization Type  [employer] (30 visit limit - calender year - PT/OT/Chiro combined)    Authorization Time Period  01/24/18-02/14/18    Authorization - Visit Number  4    Authorization - Number of Visits  30    PT Start Time  [phone] therapist running late with prior patient    PT Stop Time  0951 ended session early due to dizziness/lightheadedness    PT Time Calculation (min)  33 min    Equipment Utilized During Treatment  Gait belt    Activity Tolerance  Patient tolerated treatment well;Patient limited by fatigue    Behavior During Therapy  WFL for tasks assessed/performed       Past Medical History:  Diagnosis Date  . Cervical compression fracture ([place])   . Cervical spinal stenosis   . Stroke [facility])    2011  . Type 2 diabetes mellitus ([place])     Past Surgical History:  Procedure Laterality Date  . IR ANGIO INTRA EXTRACRAN SEL COM CAROTID INNOMINATE BILAT MOD SED  01/21/2018  . IR ANGIO VERTEBRAL SEL VERTEBRAL BILAT MOD SED  01/21/2018    There were no vitals filed for this visit.  Subjective Assessment - 02/11/18 1015    Subjective  Patient reports his blood sugar was 171 this morning and that is was around 140 yesterday. He states he was nto able to have his stent surgery because his blood sugar was too high and that he is trying to manage his diet and sugar levels to keep it lower to be able to have  his surgery. He reports he has returned to ridingi his bike in nice weather and is playing his guitar again.     Limitations  Standing;Walking    Patient Stated Goals  He would like to walk without his cane     Currently in Pain?  No/denies         [facility] PT Assessment - 02/11/18 0001      Assessment   Medical Diagnosis  L MCA Stroke-like episode    Referring Provider  Rosalin Hawking, MD    Onset Date/Surgical Date  01/19/18    Prior Therapy  Yes, ~ 1 year ago after stroke      Precautions   Precautions  None    Precaution Comments  Monitor blood sugar      Restrictions   Weight Bearing Restrictions  No      Balance Screen   Has the patient fallen in the past 6 months  No    Has the patient had a decrease in activity level because of a fear of falling?   No    Is the patient reluctant to leave their home because of a fear of falling?   No      Prior Function   Level of Independence  Independent  Observation/Other Assessments   Focus on Therapeutic Outcomes (FOTO)   -- 40% limited on 01/24/18      Sensation   Light Touch  Appears Intact      Functional Tests   Functional tests  Single leg stance      Single Leg Stance   Comments  Rt LE = 3 seconds, Lt LE = 5 seconds      Ambulation/Gait   Ambulation/Gait  Yes    Ambulation/Gait Assistance  6: Modified independent (Device/Increase time)    Ambulation Distance (Feet)  346 Feet 2MWT    Assistive device  Straight cane    Gait Pattern  Step-through pattern;Decreased weight shift to right    Ambulation Surface  Level    Gait velocity  0.87 m/s    Stairs  Yes    Stair Management Technique  One rail Right;Forwards;Step to pattern    Number of Stairs  4    Height of Stairs  6    Gait Comments  Patient with reliance on [facility] for balcne with standing and ambulation; patient continues with preference to use SPC in Rt UE rather than Lt. Patient attempting step over step pattern with SOC and railing on step but poor sequencing,  able to perform step to pattern with 1 HR      Standardized Balance Assessment   Standardized Balance Assessment  Dynamic Gait Index      Dynamic Gait Index   Level Surface  Mild Impairment    Change in Gait Speed  Moderate Impairment    Gait with Horizontal Head Turns  Moderate Impairment c/o dizziness    Gait with Vertical Head Turns  Moderate Impairment c/o dizziness    Gait and Pivot Turn  Moderate Impairment    Step Over Obstacle  Mild Impairment    Step Around Obstacles  Mild Impairment    Steps  Moderate Impairment    Total Score  11    DGI comment:  11/24 with SPC idicating fall risk        Balance Exercises - 02/11/18 1009      Balance Exercises: Standing   Tandem Stance  2 reps;Intermittent upper extremity support;15 secs 2 reps bil LE    SLS  5 reps;Intermittent upper extremity support;Solid surface;15 secs 5 reps Bil LE        PT Education - 02/11/18 1010    Education provided  Yes    Education Details  Educated on overall progress in therapy and updated HEP with balance activities at home. Educated on visit limit and continuing therpay for next 3 visits to see where patient is with long term goals at that time.     Person(s) Educated  Patient    Methods  Explanation    Comprehension  Verbalized understanding       PT Short Term Goals - 02/11/18 0920      PT SHORT TERM GOAL #1   Title  Patient will verbalize meaning of "FAST" for stroke signs/symptoms and be able to list 4 modifiable risk factors for stroke to reduce risk of stroke.     Baseline  02/11/18 - face, arm, speech, time; diet (low sugar/salt), medication, exercise,     Time  2    Period  Weeks    Status  Achieved        PT Long Term Goals - 02/11/18 2423      PT LONG TERM GOAL #1   Title  Patient will perform 2MWT with LRAD  at = or > 1.0 m/s to improve safety with commnity ambulation and demonstrate decreased fall risk.    Baseline  02/11/18 - 0.87 m/s today with SPC    Time  3    Period   Weeks    Status  On-going      PT LONG TERM GOAL #2   Title  Patient will perform SLS for Bil LE for 15 seconds each to demonstrate improved balance and safety with gait and stair mobility to be able to transition to ambulation with no device.    Baseline  see assessment    Time  3    Period  Weeks    Status  On-going      PT LONG TERM GOAL #3   Title  Patient will improve DGI by 4 points to demonstrate significant improvement in balance during gait and decrease risk of falling    Time  2    Period  Weeks    Status  New    Target Date  02/25/18      PT LONG TERM GOAL #4   Title  Patient will improve MMT for limited groups of Rt LE by 1/2 grade to demonstrate improved LE strength to improve gait/mobility performace and quality    Time  2    Period  Weeks    Status  New        Plan - 02/11/18 0920    Clinical Impression Statement  Mini-reassessment/progress note performed today and patient has met his short term goal to be able to name 4 modifiable risk factor to decrease risk of strokes and was able to sate the meaning of "FAST" acronym without prompting. He denies difficulty with his HEP and is using her total gym at home as well. He continues to have difficulty walking with unsteadiness and with fatigue while ambulating. His DGI indicates he is at a fall risk and he was unable to perform SLS for > 3-5 seconds bil LE. He was educated on additional balance exercise for HEP and the  benefit of continuing therapy to strengthen Rt LE and improve balance. Gait without SPC was attempted in // bars today and patient had notable trembling of Rt LE and decreased stance time on Rt. He will benefit from continued PT interventions to address impairments. Visit use should be monitored as he has annual visit limit (30) and will potentially need visits following his surgery.    History and Personal Factors relevant to plan of care:  cervical spinal stenosis and carotid artery occlusion (upcoming  consultaion for surgery to have stents placed)    Rehab Potential  Good    PT Frequency  2x / week    PT Duration  2 weeks    PT Treatment/Interventions  ADLs/Self Care Home Management;DME Instruction;Gait training;Stair training;Functional mobility training;Therapeutic activities;Therapeutic exercise;Balance training;Neuromuscular re-education;Patient/family education    PT Next Visit Plan  Check blood sugar (do not continue exercise if above 300). Review compliance with HEP and progress functional LE strengthening (heel raise, toe raise, step-ups, sit to stand). Continue balance training as appropriate and monitor BP and HR.    PT Home Exercise Plan  5/29: bridge, STS, heel raise and LAQ; 02/11/18 - SLS and Tandem at counter;     Consulted and Agree with Plan of Care  Patient       Patient will benefit from skilled therapeutic intervention in order to improve the following deficits and impairments:  Abnormal gait, Postural dysfunction, Improper body mechanics,  Decreased mobility, Cardiopulmonary status limiting activity, Decreased activity tolerance, Decreased strength, Decreased endurance, Decreased balance  Visit Diagnosis: Other symptoms and signs involving the musculoskeletal system  Muscle weakness (generalized)  Other abnormalities of gait and mobility     Problem List Patient Active Problem List   Diagnosis Date Noted  . Intracranial atherosclerosis 01/22/2018  . Spinal stenosis in cervical region   . Diabetes mellitus type 2 in nonobese (HCC)   . Left carotid artery occlusion 01/21/2018  . Cervical stenosis of spinal canal 01/21/2018  . Right hand weakness 01/21/2018  . Numbness of right hand 01/21/2018  . Essential hypertension 01/21/2018  . Hyperlipidemia 01/21/2018  . Diabetes ([place]) 01/21/2018  . Hx of completed stroke 01/21/2018  . Leukopenia 01/21/2018  . Thrombocytopenia ([place]) 01/21/2018  . 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

## 2018-02-13 ENCOUNTER — Ambulatory Visit (HOSPITAL_COMMUNITY): Payer: BLUE CROSS/BLUE SHIELD

## 2018-02-13 ENCOUNTER — Encounter (HOSPITAL_COMMUNITY): Payer: Self-pay

## 2018-02-13 ENCOUNTER — Other Ambulatory Visit: Payer: Self-pay

## 2018-02-13 DIAGNOSIS — R29898 Other symptoms and signs involving the musculoskeletal system: Secondary | ICD-10-CM

## 2018-02-13 DIAGNOSIS — R2689 Other abnormalities of gait and mobility: Secondary | ICD-10-CM

## 2018-02-13 DIAGNOSIS — M6281 Muscle weakness (generalized): Secondary | ICD-10-CM

## 2018-02-13 NOTE — Therapy (Signed)
[place], [place], [address] Phone: [phone]   Fax:  [phone]  Physical Therapy Treatment  Patient Details  Name: Mark Foster MRN: [id number] Date of Birth: 1956-03-08 Referring Provider: Rosalin Hawking, MD   Encounter Date: 02/13/2018  PT End of Session - 02/13/18 0903    Visit Number  5    Number of Visits  11    Date for PT Re-Evaluation  02/14/18    Authorization Type  [employer] [place] (30 visit limit - calender year - PT/OT/Chiro combined)    Authorization Time Period  01/24/18-02/14/18    Authorization - Visit Number  4    Authorization - Number of Visits  30    PT Start Time  0903    PT Stop Time  0945    PT Time Calculation (min)  42 min    Equipment Utilized During Treatment  Gait belt    Activity Tolerance  Patient tolerated treatment well;Patient limited by fatigue    Behavior During Therapy  [facility] for tasks assessed/performed       Past Medical History:  Diagnosis Date  . Cervical compression fracture ([place])   . Cervical spinal stenosis   . Stroke [facility])    2011  . Type 2 diabetes mellitus ([place])     Past Surgical History:  Procedure Laterality Date  . IR ANGIO INTRA EXTRACRAN SEL COM CAROTID INNOMINATE BILAT MOD SED  01/21/2018  . IR ANGIO VERTEBRAL SEL VERTEBRAL BILAT MOD SED  01/21/2018    There were no vitals filed for this visit.  Subjective Assessment - 02/13/18 0949    Subjective  Patient reports he did not sleep well last night and is feeling tired. He reports he checked his blood sugar and it is around 190, he states he is trying to get a referral for endocrinology to manage his DM but is having trouble getting in touch with his PCP to get the referral sent.     Limitations  Standing;Walking    Patient Stated Goals  He would like to walk without his cane     Currently in Pain?  No/denies         [facility] Adult PT Treatment/Exercise - 02/13/18 0001      Knee/Hip Exercises:  Standing   Heel Raises  Both;Limitations;15 reps;1 set    Heel Raises Limitations  heel and toe    Forward Step Up  Right;2 sets;10 reps;Step Height: 6";Hand Hold: 1    Rocker Board  2 minutes;Limitations    Rocker Board Limitations  2x 80minute lateral; 1x 1 minute ant/post to maintain midline balance      Knee/Hip Exercises: Seated   Sit to Sand  1 set;without UE support;Other (comment) 7 reps, with Lt LE on 2" step and anterior       Balance Exercises - 02/13/18 0948      Balance Exercises: Standing   Tandem Stance  2 reps;Intermittent upper extremity support;15 secs;Foam/compliant surface 2 reps bil LE    Standing, One Foot on a Step  Eyes open;4 inch;3 reps;10 secs 3 reps Bil LE        PT Education - 02/13/18 0950    Education provided  Yes    Education Details  Educated on calling his MD to get referral and potentially calling an endocrinology office to speak with them and try to expedite the referral by asking them to sent one to be signed by his MD to start  care.     Person(s) Educated  Patient    Methods  Explanation    Comprehension  Verbalized understanding       PT Short Term Goals - 02/11/18 0920      PT SHORT TERM GOAL #1   Title  Patient will verbalize meaning of "FAST" for stroke signs/symptoms and be able to list 4 modifiable risk factors for stroke to reduce risk of stroke.     Baseline  02/11/18 - face, arm, speech, time; diet (low sugar/salt), medication, exercise,     Time  2    Period  Weeks    Status  Achieved        PT Long Term Goals - 02/11/18 6237      PT LONG TERM GOAL #1   Title  Patient will perform 2MWT with LRAD at = or > 1.0 m/s to improve safety with commnity ambulation and demonstrate decreased fall risk.    Baseline  02/11/18 - 0.87 m/s today with SPC    Time  3    Period  Weeks    Status  On-going      PT LONG TERM GOAL #2   Title  Patient will perform SLS for Bil LE for 15 seconds each to demonstrate improved balance and safety  with gait and stair mobility to be able to transition to ambulation with no device.    Baseline  see assessment    Time  3    Period  Weeks    Status  On-going      PT LONG TERM GOAL #3   Title  Patient will improve DGI by 4 points to demonstrate significant improvement in balance during gait and decrease risk of falling    Time  2    Period  Weeks    Status  New    Target Date  02/25/18      PT LONG TERM GOAL #4   Title  Patient will improve MMT for limited groups of Rt LE by 1/2 grade to demonstrate improved LE strength to improve gait/mobility performace and quality    Time  2    Period  Weeks    Status  New        Plan - 02/13/18 6283    Clinical Impression Statement  Patient arrives to therapy session already feeling fatigued and session was limited due to low energy. Patient was educated throughout session on importance of rest breaks to allow body to recover. This session continued with Rt LE strengthening and balance activities. Initiated forward step ups and sit to stands with bias to facilitate increased Rt LE muscle activation. He was challenged with lateral stepping/rocking on rockerborad and had difficulty weight shifting to extend his Rt leg fully. He will continue to benefit from skilled PT interventions to address deficits and progress functional strength/mobility.    Rehab Potential  Good    PT Frequency  2x / week    PT Duration  2 weeks    PT Treatment/Interventions  ADLs/Self Care Home Management;DME Instruction;Gait training;Stair training;Functional mobility training;Therapeutic activities;Therapeutic exercise;Balance training;Neuromuscular re-education;Patient/family education    PT Next Visit Plan  Check blood sugar (do not continue exercise if above 300). Progress functional LE strengthening (heel raise, toe raise, step-ups, sit to stand). Continue balance training as appropriate and monitor BP and HR.    PT Home Exercise Plan  5/29: bridge, STS, heel raise and  LAQ; 02/11/18 - SLS and Tandem at counter;  Consulted and Agree with Plan of Care  Patient       Patient will benefit from skilled therapeutic intervention in order to improve the following deficits and impairments:  Abnormal gait, Postural dysfunction, Improper body mechanics, Decreased mobility, Cardiopulmonary status limiting activity, Decreased activity tolerance, Decreased strength, Decreased endurance, Decreased balance  Visit Diagnosis: Other symptoms and signs involving the musculoskeletal system  Muscle weakness (generalized)  Other abnormalities of gait and mobility     Problem List Patient Active Problem List   Diagnosis Date Noted  . Intracranial atherosclerosis 01/22/2018  . Spinal stenosis in cervical region   . Diabetes mellitus type 2 in nonobese (HCC)   . Left carotid artery occlusion 01/21/2018  . Cervical stenosis of spinal canal 01/21/2018  . Right hand weakness 01/21/2018  . Numbness of right hand 01/21/2018  . Essential hypertension 01/21/2018  . Hyperlipidemia 01/21/2018  . Diabetes ([place]) 01/21/2018  . Hx of completed stroke 01/21/2018  . Leukopenia 01/21/2018  . Thrombocytopenia ([place]) 01/21/2018  . 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

## 2018-02-18 ENCOUNTER — Ambulatory Visit (HOSPITAL_COMMUNITY): Payer: BLUE CROSS/BLUE SHIELD

## 2018-02-18 ENCOUNTER — Encounter (HOSPITAL_COMMUNITY): Payer: Self-pay

## 2018-02-18 ENCOUNTER — Other Ambulatory Visit: Payer: Self-pay

## 2018-02-18 DIAGNOSIS — R29898 Other symptoms and signs involving the musculoskeletal system: Secondary | ICD-10-CM | POA: Diagnosis not present

## 2018-02-18 DIAGNOSIS — M6281 Muscle weakness (generalized): Secondary | ICD-10-CM

## 2018-02-18 DIAGNOSIS — R2689 Other abnormalities of gait and mobility: Secondary | ICD-10-CM

## 2018-02-18 NOTE — Therapy (Signed)
[place], [place], [address] Phone: [phone]   Fax:  [phone]  Physical Therapy Treatment  Patient Details  Name: Mark Foster MRN: [id number] Date of Birth: [birth year] Referring Provider: Rosalin Hawking, MD   Encounter Date: 02/18/2018  PT End of Session - 02/18/18 0906    Visit Number  6    Number of Visits  11    Date for PT Re-Evaluation  02/14/18    Authorization Type  [employer] [place] (30 visit limit - calender year - PT/OT/Chiro combined)    Authorization Time Period  01/24/18-02/14/18    Authorization - Visit Number  5    Authorization - Number of Visits  30    PT Start Time  0903    PT Stop Time  0943    PT Time Calculation (min)  40 min    Equipment Utilized During Treatment  Gait belt    Activity Tolerance  Patient tolerated treatment well;Patient limited by fatigue    Behavior During Therapy  [facility] for tasks assessed/performed       Past Medical History:  Diagnosis Date  . Cervical compression fracture ([place])   . Cervical spinal stenosis   . Stroke [facility])    2011  . Type 2 diabetes mellitus ([place])     Past Surgical History:  Procedure Laterality Date  . IR ANGIO INTRA EXTRACRAN SEL COM CAROTID INNOMINATE BILAT MOD SED  01/21/2018  . IR ANGIO VERTEBRAL SEL VERTEBRAL BILAT MOD SED  01/21/2018    There were no vitals filed for this visit.  Subjective Assessment - 02/18/18 0905    Subjective  Patient reports he is feeling well this morning and his blood sugar was 98 this morning. He also slept a little bit better and was able to eat breakfast. He reports he has lost about 5 pounds since changing his diet and that he feels like it is muscle. He also has set up an appointment with an endocrinologist for July 2nd.    Limitations  Standing;Walking    Patient Stated Goals  He would like to walk without his cane     Currently in Pain?  No/denies        [facility] Adult PT Treatment/Exercise -  02/18/18 0001      Knee/Hip Exercises: Standing   Heel Raises  Both;Limitations;1 set;20 reps    Heel Raises Limitations  heel and toe    Hip Abduction  Stengthening;Both;2 sets;10 reps    Hip Extension  Stengthening;Both;2 sets;10 reps    Step Down  Step Height: 4";Hand Hold: 1;2 sets;5 reps;Both         Balance Exercises - 02/18/18 0921      Balance Exercises: Standing   Tandem Stance  Intermittent upper extremity support;Foam/compliant surface;20 secs;3 reps 3 reps Bil LE alt foot alignment    SLS with Vectors  Solid surface;Intermittent upper extremity assist;Limitations 10 x (3) cone taps bil LE    Rockerboard  Lateral;Anterior/posterior 2x 1 minute, lateral, A/P        PT Education - 02/18/18 0935    Education provided  Yes    Education Details  Educated on exercise throughout and discussed importance of HEP participation. Educated on importance of rest breaks.    Person(s) Educated  Patient    Methods  Explanation    Comprehension  Verbalized understanding       PT Short Term Goals - 02/11/18 0920      PT  SHORT TERM GOAL #1   Title  Patient will verbalize meaning of "FAST" for stroke signs/symptoms and be able to list 4 modifiable risk factors for stroke to reduce risk of stroke.     Baseline  02/11/18 - face, arm, speech, time; diet (low sugar/salt), medication, exercise,     Time  2    Period  Weeks    Status  Achieved        PT Long Term Goals - 02/11/18 5009      PT LONG TERM GOAL #1   Title  Patient will perform 2MWT with LRAD at = or > 1.0 m/s to improve safety with commnity ambulation and demonstrate decreased fall risk.    Baseline  02/11/18 - 0.87 m/s today with SPC    Time  3    Period  Weeks    Status  On-going      PT LONG TERM GOAL #2   Title  Patient will perform SLS for Bil LE for 15 seconds each to demonstrate improved balance and safety with gait and stair mobility to be able to transition to ambulation with no device.    Baseline  see  assessment    Time  3    Period  Weeks    Status  On-going      PT LONG TERM GOAL #3   Title  Patient will improve DGI by 4 points to demonstrate significant improvement in balance during gait and decrease risk of falling    Time  2    Period  Weeks    Status  New    Target Date  02/25/18      PT LONG TERM GOAL #4   Title  Patient will improve MMT for limited groups of Rt LE by 1/2 grade to demonstrate improved LE strength to improve gait/mobility performace and quality    Time  2    Period  Weeks    Status  New        Plan - 02/18/18 0935    Clinical Impression Statement  Patient with much improved energy this session since last and had improved ankle/hip strategies with tandem stance activity. He also progressed SLS challenge today and performed 2 x 5 reps of (3) cone taps with Bil LE. He required rest breaks due to fatigue and needed fewer reminders to take rest breaks if feeling overly fatigued or light headed. Patient denies dizziness/light headed sensation throughout session. He initiated step down for functional strengthening today and performed 2x 5 reps bil LE with increased difficulty on Rt LE. He will continue to benefit from skilled PT interventions to address deficits and progress functional strength/mobility.    Rehab Potential  Good    PT Frequency  2x / week    PT Duration  2 weeks    PT Treatment/Interventions  ADLs/Self Care Home Management;DME Instruction;Gait training;Stair training;Functional mobility training;Therapeutic activities;Therapeutic exercise;Balance training;Neuromuscular re-education;Patient/family education    PT Next Visit Plan  Check blood sugar (do not continue exercise if above 300). Progress functional LE strengthening with wall squats, step downs. Continue balance training and progress tandem gait/side stepping as appropriate. Monitor BP and HR.    PT Home Exercise Plan  5/29: bridge, STS, heel raise and LAQ; 02/11/18 - SLS and Tandem at counter;      Consulted and Agree with Plan of Care  Patient       Patient will benefit from skilled therapeutic intervention in order to improve the following deficits and  impairments:  Abnormal gait, Postural dysfunction, Improper body mechanics, Decreased mobility, Cardiopulmonary status limiting activity, Decreased activity tolerance, Decreased strength, Decreased endurance, Decreased balance  Visit Diagnosis: Other symptoms and signs involving the musculoskeletal system  Muscle weakness (generalized)  Other abnormalities of gait and mobility     Problem List Patient Active Problem List   Diagnosis Date Noted  . Intracranial atherosclerosis 01/22/2018  . Spinal stenosis in cervical region   . Diabetes mellitus type 2 in nonobese (HCC)   . Left carotid artery occlusion 01/21/2018  . Cervical stenosis of spinal canal 01/21/2018  . Right hand weakness 01/21/2018  . Numbness of right hand 01/21/2018  . Essential hypertension 01/21/2018  . Hyperlipidemia 01/21/2018  . Diabetes ([place]) 01/21/2018  . Hx of completed stroke 01/21/2018  . Leukopenia 01/21/2018  . Thrombocytopenia ([place]) 01/21/2018  . 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

## 2018-02-20 ENCOUNTER — Encounter (HOSPITAL_COMMUNITY): Payer: Self-pay

## 2018-02-20 ENCOUNTER — Other Ambulatory Visit: Payer: Self-pay

## 2018-02-20 ENCOUNTER — Ambulatory Visit (HOSPITAL_COMMUNITY): Payer: BLUE CROSS/BLUE SHIELD

## 2018-02-20 DIAGNOSIS — R2689 Other abnormalities of gait and mobility: Secondary | ICD-10-CM

## 2018-02-20 DIAGNOSIS — R29898 Other symptoms and signs involving the musculoskeletal system: Secondary | ICD-10-CM

## 2018-02-20 DIAGNOSIS — M6281 Muscle weakness (generalized): Secondary | ICD-10-CM

## 2018-02-20 NOTE — Therapy (Signed)
[place], [place], [address] Phone: [phone]   Fax:  [phone]  Physical Therapy Treatment  Patient Details  Name: Mark Foster MRN: [id number] Date of Birth: [birth year] Referring Provider: Rosalin Hawking, MD   Encounter Date: 02/20/2018  PT End of Session - 02/20/18 0925    Visit Number  6    Number of Visits  11    Date for PT Re-Evaluation  02/14/18    Authorization Type  [employer] [place] (30 visit limit - calender year - PT/OT/Chiro combined)    Authorization Time Period  01/24/18-02/14/18    Authorization - Visit Number  6    Authorization - Number of Visits  30    PT Start Time  0909    PT Stop Time  0948    PT Time Calculation (min)  39 min    Equipment Utilized During Treatment  Gait belt    Activity Tolerance  Patient tolerated treatment well;Patient limited by fatigue    Behavior During Therapy  [facility] for tasks assessed/performed       Past Medical History:  Diagnosis Date  . Cervical compression fracture ([place])   . Cervical spinal stenosis   . Stroke [facility])    2011  . Type 2 diabetes mellitus ([place])     Past Surgical History:  Procedure Laterality Date  . IR ANGIO INTRA EXTRACRAN SEL COM CAROTID INNOMINATE BILAT MOD SED  01/21/2018  . IR ANGIO VERTEBRAL SEL VERTEBRAL BILAT MOD SED  01/21/2018    There were no vitals filed for this visit.  Subjective Assessment - 02/20/18 0912    Subjective  Patient arrives reporting soreness in bil LE's and states he gets that every so often. He reports he has joined the [employer] locally and is plannign to start working out there regularly.    Limitations  Standing;Walking    Patient Stated Goals  He would like to walk without his cane     Currently in Pain?  Yes    Pain Score  4     Pain Location  Leg    Pain Orientation  Right;Left generally all over both legs    Pain Descriptors / Indicators  Aching;Sore    Pain Type  Chronic pain    Pain Onset   Yesterday    Pain Frequency  Constant    Aggravating Factors   unsure    Pain Relieving Factors  unsure        [facility] Adult PT Treatment/Exercise - 02/20/18 0001      Knee/Hip Exercises: Standing   Hip Abduction  Stengthening;Both;10 reps;1 set;Knee straight no theraband    Hip Extension  Stengthening;Both;2 sets;10 reps;Knee straight;Limitations    Extension Limitations  red theraband    Forward Step Up  Right;2 sets;10 reps;Step Height: 6";Hand Hold: 2    Step Down  Step Height: 4";Both;Hand Hold: 2;10 reps;3 sets        Balance Exercises - 02/20/18 0934      Balance Exercises: Standing   Tandem Stance  Eyes open;Foam/compliant surface;20 secs;2 reps alt foot alignment    Tandem Gait  Forward;4 reps 15'    Sidestepping  Foam/compliant support;1 rep;Limitations 1 rep RT 15'    Step Over Hurdles / Cones  2 x 6" hurdles altearl step over from center, 5 reps total    Other Standing Exercises  Tandem on faom with theraband forward punch, Red TB, 5 reps, 4 way (resistance fro Rt/Lt and  alternating foot alignment       PT Education - 02/20/18 1000    Education provided  Yes    Education Details  Educated on exercises throughout session and on weight shifting strategies. Educated on visits next week and plan to prepare for discharge with HEP next week and transition to regular exercises at local fitness faciltiy (patient has membership at [facility])    [employer]) Educated  Patient    Methods  Explanation    Comprehension  Verbalized understanding;Returned demonstration       PT Short Term Goals - 02/11/18 0920      PT SHORT TERM GOAL #1   Title  Patient will verbalize meaning of "FAST" for stroke signs/symptoms and be able to list 4 modifiable risk factors for stroke to reduce risk of stroke.     Baseline  02/11/18 - face, arm, speech, time; diet (low sugar/salt), medication, exercise,     Time  2    Period  Weeks    Status  Achieved        PT Long Term Goals - 02/11/18 0263       PT LONG TERM GOAL #1   Title  Patient will perform 2MWT with LRAD at = or > 1.0 m/s to improve safety with commnity ambulation and demonstrate decreased fall risk.    Baseline  02/11/18 - 0.87 m/s today with SPC    Time  3    Period  Weeks    Status  On-going      PT LONG TERM GOAL #2   Title  Patient will perform SLS for Bil LE for 15 seconds each to demonstrate improved balance and safety with gait and stair mobility to be able to transition to ambulation with no device.    Baseline  see assessment    Time  3    Period  Weeks    Status  On-going      PT LONG TERM GOAL #3   Title  Patient will improve DGI by 4 points to demonstrate significant improvement in balance during gait and decrease risk of falling    Time  2    Period  Weeks    Status  New    Target Date  02/25/18      PT LONG TERM GOAL #4   Title  Patient will improve MMT for limited groups of Rt LE by 1/2 grade to demonstrate improved LE strength to improve gait/mobility performace and quality    Time  2    Period  Weeks    Status  New       Plan - 02/20/18 7858    Clinical Impression Statement  Patient arrived with Bil LE soreness and was more fatigued this session. Therapy continued with focus on Rt LE strengthening and balance training. He continued to demonstrate improved stability with tandem stance on foam and exercise advanced today with resisted theraband forward punches. Tandem gait was also introduced and side stepping on foam. He demonstrated good postural righting strategies with side stepping on foam to prevent ant/post LOB. Hip extension progressed with resistance form red theraband and attempted to progress hip abduction, however this remains too challenging for patient on Rt LE. Mark Foster continued to require rest breaks due to fatigue although he denied dizziness/light headed sensation throughout session. He will continue to benefit from skilled PT interventions to address deficits and progress functional  strength/mobility.    Rehab Potential  Good    PT Frequency  2x /  week    PT Duration  2 weeks    PT Treatment/Interventions  ADLs/Self Care Home Management;DME Instruction;Gait training;Stair training;Functional mobility training;Therapeutic activities;Therapeutic exercise;Balance training;Neuromuscular re-education;Patient/family education    PT Next Visit Plan  Check blood sugar (do not continue exercise if above 300). Discuss Wednesday appointment for re-assessment and transition into [facility] program. Progress functional LE strengthening with wall squats, step downs. Continue tandem gait/side stepping as appropriate. Advance HEP in preparation for discharge. Monitor BP and HR.    PT Home Exercise Plan  5/29: bridge, STS, heel raise and LAQ; 02/11/18 - SLS and Tandem at counter;     Consulted and Agree with Plan of Care  Patient       Patient will benefit from skilled therapeutic intervention in order to improve the following deficits and impairments:  Abnormal gait, Postural dysfunction, Improper body mechanics, Decreased mobility, Cardiopulmonary status limiting activity, Decreased activity tolerance, Decreased strength, Decreased endurance, Decreased balance  Visit Diagnosis: Other symptoms and signs involving the musculoskeletal system  Muscle weakness (generalized)  Other abnormalities of gait and mobility     Problem List Patient Active Problem List   Diagnosis Date Noted  . Intracranial atherosclerosis 01/22/2018  . Spinal stenosis in cervical region   . Diabetes mellitus type 2 in nonobese (HCC)   . Left carotid artery occlusion 01/21/2018  . Cervical stenosis of spinal canal 01/21/2018  . Right hand weakness 01/21/2018  . Numbness of right hand 01/21/2018  . Essential hypertension 01/21/2018  . Hyperlipidemia 01/21/2018  . Diabetes ([place]) 01/21/2018  . Hx of completed stroke 01/21/2018  . Leukopenia 01/21/2018  . Thrombocytopenia ([place]) 01/21/2018  . L MCA Stroke-like  episode [facility]) s/p IV tPA 01/19/2018    Kipp Brood, PT, DPT Physical Therapist with [facility]  02/20/2018 10:12 AM    [place], [place], [address] Phone: [phone]   Fax:  [phone]  Name: Mark Foster MRN: [id number] Date of Birth: [birth year]

## 2018-02-22 ENCOUNTER — Ambulatory Visit: Payer: BLUE CROSS/BLUE SHIELD | Admitting: Adult Health

## 2018-02-22 ENCOUNTER — Encounter: Payer: Self-pay | Admitting: Adult Health

## 2018-02-22 VITALS — BP 112/59 | HR 80 | Ht 71.0 in | Wt 150.8 lb

## 2018-02-22 DIAGNOSIS — E785 Hyperlipidemia, unspecified: Secondary | ICD-10-CM

## 2018-02-22 DIAGNOSIS — R2990 Unspecified symptoms and signs involving the nervous system: Secondary | ICD-10-CM

## 2018-02-22 DIAGNOSIS — I639 Cerebral infarction, unspecified: Secondary | ICD-10-CM

## 2018-02-22 DIAGNOSIS — I10 Essential (primary) hypertension: Secondary | ICD-10-CM

## 2018-02-22 DIAGNOSIS — E119 Type 2 diabetes mellitus without complications: Secondary | ICD-10-CM | POA: Diagnosis not present
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
## 2018-02-22 NOTE — Patient Instructions (Signed)
Continue aspirin 325 mg daily and clopidogrel 75 mg daily  and lipitor  for secondary stroke prevention  Stop aspirin and continue plavix only after 04/21/18  Continue to follow up with PCP regarding cholesterol and blood pressure management   Follow up with endocrinologist for diabetes control  Continue to monitor blood pressure at home  Maintain strict control of hypertension with blood pressure goal below 130/90, diabetes with hemoglobin A1c goal below 6.5% and cholesterol with LDL cholesterol (bad cholesterol) goal below 70 mg/dL. I also advised the patient to eat a healthy diet with plenty of whole grains, cereals, fruits and vegetables, exercise regularly and maintain ideal body weight.  Followup in the future with me in 3 months or call earlier if needed        Thank you for coming to see [place] at [facility]. I hope we have been able to provide you high quality care today.  You may receive a patient satisfaction survey over the next few weeks. We would appreciate your feedback and comments so that we may continue to improve ourselves and the health of our patients.

## 2018-02-22 NOTE — Progress Notes (Signed)
I agree with the above plan 

## 2018-02-25 ENCOUNTER — Encounter (HOSPITAL_COMMUNITY): Payer: Self-pay

## 2018-02-25 ENCOUNTER — Other Ambulatory Visit: Payer: Self-pay

## 2018-02-25 ENCOUNTER — Ambulatory Visit (HOSPITAL_COMMUNITY): Payer: BLUE CROSS/BLUE SHIELD

## 2018-02-25 DIAGNOSIS — M6281 Muscle weakness (generalized): Secondary | ICD-10-CM

## 2018-02-25 DIAGNOSIS — R29898 Other symptoms and signs involving the musculoskeletal system: Secondary | ICD-10-CM

## 2018-02-25 DIAGNOSIS — R2689 Other abnormalities of gait and mobility: Secondary | ICD-10-CM

## 2018-02-25 NOTE — Therapy (Signed)
[place], [place], [address] Phone: [phone]   Fax:  [phone]  Physical Therapy Treatment/Discharge Summary  Patient Details  Name: Mark Foster MRN: [id number] Date of Birth: 03-26-1956 Referring Provider: Rosalin Hawking, MD   Encounter Date: 02/25/2018   PHYSICAL THERAPY DISCHARGE SUMMARY  Visits from Start of Care: 8  Current functional level related to goals / functional outcomes: Re-assessment performed today and patient has previously met his short term goal to be able to name 4 modifiable risk factor to decrease risk of strokes and was able to sate the meaning of "FAST" acronym. He has now met 2/4 long term goals and made progress towards his goal to improve balance during gait. He improved DGI by 3 points but remains within the fall risk category. He also improved SLS balance for bil LE and his Bil LE strength improved for all groups by 1/2 grade or greater. He has reported interest in transitioning to regular exercise routine at local fitness facility and was educated on safe strategies to continue strengthening and improving balance. He was provided updated HEP to further progress at home and was educated on continued fall risk. He will be discharged from therapy today as he has made good progress and would like to exercise independently to have visits remaining in case he would need any after his surgery.  Remaining deficits: See below, remains limited with balance, DGI is indicative of fall risk.   Education / Equipment: Educated on overall progress toawrds goals and on safe exercises to progress to at [employer]. Educated on balance exercise in pool for safety.   Plan: Patient agrees to discharge.  Patient goals were partially met. Patient is being discharged due to meeting the stated rehab goals.  ?????       PT End of Session - 02/25/18 0912    Visit Number  8    Number of Visits  11    Date for PT  Re-Evaluation  02/14/18    Authorization Type  [employer] (30 visit limit - calender year - PT/OT/Chiro combined)    Authorization Time Period  01/24/18-02/14/18    Authorization - Visit Number  8    Authorization - Number of Visits  30    PT Start Time  0904    PT Stop Time  0947    PT Time Calculation (min)  43 min    Equipment Utilized During Treatment  Gait belt    Activity Tolerance  Patient tolerated treatment well;Patient limited by fatigue    Behavior During Therapy  WFL for tasks assessed/performed       Past Medical History:  Diagnosis Date  . Cervical compression fracture ([place])   . Cervical spinal stenosis   . Stroke [facility])    2011  . Type 2 diabetes mellitus ([place])     Past Surgical History:  Procedure Laterality Date  . IR ANGIO INTRA EXTRACRAN SEL COM CAROTID INNOMINATE BILAT MOD SED  01/21/2018  . IR ANGIO VERTEBRAL SEL VERTEBRAL BILAT MOD SED  01/21/2018    There were no vitals filed for this visit.  Subjective Assessment - 02/25/18 1003    Subjective  Patient arrives reporting he is planning to go to the [facility] after therapy today. He states he believes today is his last session.    Limitations  Standing;Walking    Patient Stated Goals  He would like to walk without his cane     Currently in  Pain?  No/denies        [facility] PT Assessment - 02/25/18 0001      Assessment   Medical Diagnosis  L MCA Stroke-like episode    Referring Provider  Rosalin Hawking, MD    Onset Date/Surgical Date  01/19/18    Prior Therapy  Yes, ~ 1 year ago after stroke      Precautions   Precautions  None    Precaution Comments  Monitor blood sugar      Restrictions   Weight Bearing Restrictions  No      Balance Screen   Has the patient fallen in the past 6 months  No    Has the patient had a decrease in activity level because of a fear of falling?   No    Is the patient reluctant to leave their home because of a fear of falling?   No      Prior Function   Level of  Independence  Independent    Vocation  Retired;On disability      Observation/Other Assessments   Focus on Therapeutic Outcomes (FOTO)   32% limited was 40% limited on 01/24/18      Functional Tests   Functional tests  Single leg stance      Single Leg Stance   Comments  Rt LE = 10 seconds, Lt LE = 15 seconds      Strength   Overall Strength Comments  BUE strength is WNL    Right Hip Flexion  4/5 was 3/5    Right Hip Extension  4-/5 was 2+/5    Right Hip ABduction  4-/5 was 3/5    Left Hip Flexion  4/5 was 3+/5    Left Hip Extension  4-/5 was 2+/5    Left Hip ABduction  4/5 was 3+/5    Right Knee Flexion  4-/5 was 3/5    Right Knee Extension  4-/5 was 3+/5    Left Knee Flexion  4-/5 was 3/5    Left Knee Extension  4/5 was 3+/5    Right Ankle Dorsiflexion  4-/5 was 3/5    Right Ankle Plantar Flexion  3+/5 was 2+/5    Left Ankle Dorsiflexion  4-/5 was 3/5    Left Ankle Plantar Flexion  4/5 was 2+/5      Ambulation/Gait   Gait velocity  -- 0.87 m/s with SPC on 02/11/18      Standardized Balance Assessment   Standardized Balance Assessment  Dynamic Gait Index      Dynamic Gait Index   Level Surface  Mild Impairment    Change in Gait Speed  Mild Impairment    Gait with Horizontal Head Turns  Mild Impairment    Gait with Vertical Head Turns  Moderate Impairment    Gait and Pivot Turn  Moderate Impairment    Step Over Obstacle  Mild Impairment    Step Around Obstacles  Mild Impairment    Steps  Moderate Impairment    Total Score  13    DGI comment:  13/24 indicative of fall risk was 10/24 on 01/30/18       [facility] Adult PT Treatment/Exercise - 02/25/18 0001      Knee/Hip Exercises: Standing   Lateral Step Up  Right;2 sets;10 reps;Step Height: 4";Hand Hold: 2    Forward Step Up  Right;2 sets;10 reps;Step Height: 6";Hand Hold: 2 Lt UE support        PT Education - 02/25/18 9622  Education provided  Yes    Education Details  Educated on overall progress toawrds goals and  on safe exercises to progress to at [facility]. Educated on balance exercise in pool for safety.     Person(s) Educated  Patient    Methods  Explanation;Handout    Comprehension  Verbalized understanding;Returned demonstration       PT Short Term Goals - 02/25/18 0913      PT SHORT TERM GOAL #1   Title  Patient will verbalize meaning of "FAST" for stroke signs/symptoms and be able to list 4 modifiable risk factors for stroke to reduce risk of stroke.     Baseline  02/11/18 - face, arm, speech, time; diet (low sugar/salt), medication, exercise,     Time  2    Period  Weeks    Status  Achieved        PT Long Term Goals - 02/25/18 0913      PT LONG TERM GOAL #1   Title  Patient will perform 2MWT with LRAD at = or > 1.0 m/s to improve safety with commnity ambulation and demonstrate decreased fall risk.    Baseline  02/11/18 - 0.87 m/s today with SPC    Time  3    Period  Weeks    Status  Not Met      PT LONG TERM GOAL #2   Title  Patient will perform SLS for Bil LE for 15 seconds each to demonstrate improved balance and safety with gait and stair mobility to be able to transition to ambulation with no device.    Baseline  Rt LE = 10 sec, Lt LE = 15 sec    Time  3    Period  Weeks    Status  Partially Met      PT LONG TERM GOAL #3   Title  Patient will improve DGI by 4 points to demonstrate significant improvement in balance during gait and decrease risk of falling    Time  2    Period  Weeks    Status  Not Met      PT LONG TERM GOAL #4   Title  Patient will improve MMT for limited groups of Rt LE by 1/2 grade to demonstrate improved LE strength to improve gait/mobility performace and quality    Time  2    Period  Weeks    Status  Achieved        Plan - 02/25/18 0959    Clinical Impression Statement  Re-assessment performed today and patient has previously met his short term goal to be able to name 4 modifiable risk factor to decrease risk of strokes and was able to sate the  meaning of "FAST" acronym. He has now met 2/4 long term goals and made progress towards his goal to improve balance during gait. He improved DGI by 3 points but remains within the fall risk category. He also improved SLS balance for bil LE and his Bil LE strength improved for all groups by 1/2 grade or greater. He has reported interest in transitioning to regular exercise routine at local fitness facility and was educated on safe strategies to continue strengthening and improving balance. He was provided updated HEP to further progress at home and was educated on continued fall risk. He will be discharged from therapy today as he has made good progress and would like to exercise independently to have visits remaining in case he would need any after his surgery.  Rehab Potential  Good    PT Frequency  2x / week    PT Duration  2 weeks    PT Treatment/Interventions  ADLs/Self Care Home Management;DME Instruction;Gait training;Stair training;Functional mobility training;Therapeutic activities;Therapeutic exercise;Balance training;Neuromuscular re-education;Patient/family education    PT Next Visit Plan  Discahrge this session with HEP to transition to [facility] program.    PT Home Exercise Plan  5/29: bridge, STS, heel raise and LAQ; 02/11/18 - SLS and Tandem at counter; 02/25/18 - step ups, step downs, tandem gait for in pool    Consulted and Agree with Plan of Care  Patient       Patient will benefit from skilled therapeutic intervention in order to improve the following deficits and impairments:  Abnormal gait, Postural dysfunction, Improper body mechanics, Decreased mobility, Cardiopulmonary status limiting activity, Decreased activity tolerance, Decreased strength, Decreased endurance, Decreased balance  Visit Diagnosis: Other symptoms and signs involving the musculoskeletal system  Muscle weakness (generalized)  Other abnormalities of gait and mobility     Problem List Patient Active Problem  List   Diagnosis Date Noted  . Intracranial atherosclerosis 01/22/2018  . Spinal stenosis in cervical region   . Diabetes mellitus type 2 in nonobese (HCC)   . Left carotid artery occlusion 01/21/2018  . Cervical stenosis of spinal canal 01/21/2018  . Right hand weakness 01/21/2018  . Numbness of right hand 01/21/2018  . Essential hypertension 01/21/2018  . Hyperlipidemia 01/21/2018  . Diabetes ([place]) 01/21/2018  . Hx of completed stroke 01/21/2018  . Leukopenia 01/21/2018  . Thrombocytopenia ([place]) 01/21/2018  . 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

## 2018-02-27 ENCOUNTER — Telehealth: Payer: Self-pay

## 2018-02-27 ENCOUNTER — Ambulatory Visit (HOSPITAL_COMMUNITY): Payer: BLUE CROSS/BLUE SHIELD

## 2018-02-27 ENCOUNTER — Telehealth (HOSPITAL_COMMUNITY): Payer: Self-pay | Admitting: Radiology

## 2018-02-27 NOTE — Telephone Encounter (Signed)
Left vm for patient to call back to give name of MD or office who did his EMG/NCV in [place].

## 2018-02-27 NOTE — Telephone Encounter (Signed)
Pt has returned the call to RN Katrina. The Dr's name is  Dr Nelda Severe with [facility] in [place] ,[place] her # is [phone]

## 2018-02-27 NOTE — Telephone Encounter (Signed)
Called pt to discuss his diabetes, plan for management, as well as his upcoming treatment for carotid stenosis. The patient will see Dr. Dorris Fetch on 03/05/18 for diabetic control. I will contact him on 03/12/18 after reviewing his note from that visit and our plan is to reschedule treatment for the carotid stenosis on 03/20/18. Patient is in complete agreement with this plan of care. Patient is currently on Aspirin 325mg  1 daily and Plavix 75mg  1 daily. JM

## 2018-02-28 ENCOUNTER — Telehealth: Payer: Self-pay | Admitting: Adult Health

## 2018-02-28 NOTE — Telephone Encounter (Signed)
[place]; [place] EMG study 05/18/17  Reason for study: Patient has weakness in the legs  Findings: The right ureteral sensory nerve response reveals a normal distal latency and reduced amplitude  The right and left serial and superficial peroneal sensory responses are not seen  The right peroneal motor nerve response reveals a normal distal latency, normal amplitude and reduced conduction velocity  The right and left tibial motor nerve responses reveal normal distal latencies, normal amplitudes and reduced conduction velocity  Needle examination of the right iliopsoas reveals increased insertional activity with positive sharp waves and few polyphasic and decreased amplitude units seen with a possible early recruitment pattern.  The needle examination of the left iliopsoas reveals slight increased spontaneous activity with a few motor units that are polyphasic and decreased in amplitude with a possible early equipment pattern.  Note is made that studies are technically difficult the patient had significant pain with a needle examination and had submaximal effort.  Needle examination of other selected muscles in the right and left lower extremities reveals normal spontaneous activity with prolonged duration units and a reduced recruitment pattern  Conclusions: 1.  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  The needle examination does also should assess the possibility of a myopathy.  Note  is made that this was technically difficult due to submaximal effort related to pain 3.  Chronic bilateral lumbar radiculopathies, primarily affecting L5 nerve roots, could also be present without any evidence for active denervation   Full report will be scanned into system

## 2018-02-28 NOTE — Telephone Encounter (Signed)
EMG/NCV, office notes, other scans given to [facility] NP for review from previous neurologist in [place].

## 2018-02-28 NOTE — Telephone Encounter (Signed)
RN call Dr. Nicholas Lose office to get fax number to get EMG/NCV report. Rn was given fax number for medical records at [phone]. Rn fax release form to [phone].

## 2018-02-28 NOTE — Telephone Encounter (Signed)
Rn call Dr Wynetta Emery office to get report of NCV.EMG per J[facility]ica Np request.They stated EMG was done by a neurologist Dr. Arabella Merles MD at [facility]. Rn was given number of [phone].

## 2018-02-28 NOTE — Telephone Encounter (Signed)
Rn call Junie Panning in  Medical records at [place] office. Erin wanted clarification on what test were needed bedside the EMG. She stated office notes will be fax too if needed. Rn gave fax of [phone].

## 2018-02-28 NOTE — Telephone Encounter (Signed)
Erin/Medical Records [phone] needs clarification for request, she is unable to read some of it. Their phones shut off for lunch from 12-1.

## 2018-03-05 ENCOUNTER — Encounter: Payer: Self-pay | Admitting: "Endocrinology

## 2018-03-05 ENCOUNTER — Ambulatory Visit: Payer: BLUE CROSS/BLUE SHIELD | Admitting: "Endocrinology

## 2018-03-05 VITALS — BP 103/67 | HR 80 | Ht 71.0 in | Wt 147.0 lb

## 2018-03-05 DIAGNOSIS — E782 Mixed hyperlipidemia: Secondary | ICD-10-CM

## 2018-03-05 DIAGNOSIS — E039 Hypothyroidism, unspecified: Secondary | ICD-10-CM

## 2018-03-05 DIAGNOSIS — E1165 Type 2 diabetes mellitus with hyperglycemia: Secondary | ICD-10-CM

## 2018-03-05 DIAGNOSIS — I10 Essential (primary) hypertension: Secondary | ICD-10-CM

## 2018-03-05 NOTE — Progress Notes (Signed)
Endocrinology Consult Note       03/05/2018, 11:07 AM   Subjective:    Patient ID: Mark Foster, male    DOB: 06/20/1956.  Mark Foster is being seen in consultation for management of currently uncontrolled symptomatic diabetes requested by  Doree Albee, MD.   Past Medical History:  Diagnosis Date  . Cervical compression fracture ([place])   . Cervical spinal stenosis   . Stroke [facility])    2011  . Type 2 diabetes mellitus ([place])    Past Surgical History:  Procedure Laterality Date  . IR ANGIO INTRA EXTRACRAN SEL COM CAROTID INNOMINATE BILAT MOD SED  01/21/2018  . IR ANGIO VERTEBRAL SEL VERTEBRAL BILAT MOD SED  01/21/2018   Social History   Socioeconomic History  . Marital status: Single    Spouse name: Not on file  . Number of children: Not on file  . Years of education: Not on file  . Highest education level: Not on file  Occupational History  . Not on file  Social Needs  . Financial resource strain: Not on file  . Food insecurity:    Worry: Not on file    Inability: Not on file  . Transportation needs:    Medical: Not on file    Non-medical: Not on file  Tobacco Use  . Smoking status: Never Smoker  . Smokeless tobacco: Never Used  Substance and Sexual Activity  . Alcohol use: Not Currently  . Drug use: Never  . Sexual activity: Not on file  Lifestyle  . Physical activity:    Days per week: Not on file    Minutes per session: Not on file  . Stress: Not on file  Relationships  . Social connections:    Talks on phone: Not on file    Gets together: Not on file    Attends religious service: Not on file    Active member of club or organization: Not on file    Attends meetings of clubs or organizations: Not on file    Relationship status: Not on file  Other Topics Concern  . Not on file  Social History Narrative  . Not on file   Outpatient Encounter Medications as of 03/05/2018   Medication Sig  . aspirin EC 325 MG EC tablet Take 1 tablet (325 mg total) by mouth daily.  Marland Kitchen atorvastatin (LIPITOR) 80 MG tablet Take 1 tablet (80 mg total) by mouth daily at 6 PM.  . clopidogrel (PLAVIX) 75 MG tablet Take 1 tablet (75 mg total) by mouth daily.  . Continuous Blood Gluc Sensor (FREESTYLE LIBRE 14 DAY SENSOR) MISC 1 Device by Does not apply route every 14 (fourteen) days.  . insulin glargine (LANTUS) 100 unit/mL SOPN Inject 0.1 mLs (10 Units total) into the skin at bedtime. (Patient taking differently: Inject 20 Units into the skin daily. )  . Insulin Pen Needle 32G X 4 MM MISC 1 Device by Does not apply route daily.  . NP THYROID 30 MG tablet Take 30 mg by mouth daily.  . sertraline (ZOLOFT) 50 MG tablet Take 50 mg by mouth daily.  . vitamin B-12 (  CYANOCOBALAMIN) 500 MCG tablet Take 500 mcg by mouth daily.   No facility-administered encounter medications on file as of 03/05/2018.     ALLERGIES: Allergies  Allergen Reactions  . Demerol [Meperidine Hcl] Other (See Comments)    convulsions    VACCINATION STATUS:  There is no immunization history on file for this patient.  Diabetes  He presents for his initial diabetic visit. Onset time: He was diagnosed at approximate age of 52 years. His disease course has been improving (He did have A1c of 9.7% in May 2019.  However, more recently he has documented controlled glycemic profile averaging 134 over the last 2 weeks.). There are no hypoglycemic associated symptoms. Pertinent negatives for hypoglycemia include no confusion, headaches, pallor or seizures. There are no diabetic associated symptoms. Pertinent negatives for diabetes include no chest pain, no fatigue, no polydipsia, no polyphagia, no polyuria and no weakness. There are no hypoglycemic complications. Symptoms are improving. Diabetic complications include a CVA. Pertinent negatives for diabetic complications include no heart disease. (Awaiting stent placement in his  carotid artery.) Risk factors for coronary artery disease include diabetes mellitus, dyslipidemia, hypertension, male sex and sedentary lifestyle. Current diabetic treatment includes insulin injections. His weight is decreasing steadily. He is following a generally unhealthy diet. When asked about meal planning, he reported none. He has not had a previous visit with a dietitian. He rarely participates in exercise. (His CGM glucose patterns summary shows 90% time in range, 9% above range.  Average blood glucose 134 for the last 2 weeks.) An ACE inhibitor/angiotensin II receptor blocker is not being taken.  Hyperlipidemia  This is a chronic problem. The current episode started more than 1 year ago. The problem is uncontrolled. Exacerbating diseases include diabetes. Pertinent negatives include no chest pain, myalgias or shortness of breath. Current antihyperlipidemic treatment includes statins. Risk factors for coronary artery disease include diabetes mellitus, dyslipidemia, hypertension, male sex and a sedentary lifestyle.     Review of Systems  Constitutional: Negative for chills, fatigue, fever and unexpected weight change.  HENT: Negative for dental problem, mouth sores and trouble swallowing.   Eyes: Negative for visual disturbance.  Respiratory: Negative for cough, choking, chest tightness, shortness of breath and wheezing.   Cardiovascular: Negative for chest pain, palpitations and leg swelling.  Gastrointestinal: Negative for abdominal distention, abdominal pain, constipation, diarrhea, nausea and vomiting.  Endocrine: Negative for polydipsia, polyphagia and polyuria.  Genitourinary: Negative for dysuria, flank pain, hematuria and urgency.  Musculoskeletal: Negative for back pain, gait problem, myalgias and neck pain.  Skin: Negative for pallor, rash and wound.  Neurological: Negative for seizures, syncope, weakness, numbness and headaches.  Psychiatric/Behavioral: Negative.  Negative for  confusion and dysphoric mood.    Objective:    BP 103/67   Pulse 80   Ht 5\' 11"  (1.803 m)   Wt 147 lb (66.7 kg)   BMI 20.50 kg/m   Wt Readings from Last 3 Encounters:  03/05/18 147 lb (66.7 kg)  02/22/18 150 lb 12.8 oz (68.4 kg)  01/19/18 156 lb (70.8 kg)     Physical Exam  Constitutional: He is oriented to person, place, and time. He appears well-developed. He is cooperative. No distress.  Thin build.  HENT:  Head: Normocephalic and atraumatic.  Eyes: EOM are normal.  Neck: Normal range of motion. Neck supple. No tracheal deviation present. No thyromegaly present.  Cardiovascular: Normal rate, S1 normal, S2 normal and normal heart sounds. Exam reveals no gallop.  No murmur heard. Pulses:  Dorsalis pedis pulses are 1+ on the right side, and 1+ on the left side.       Posterior tibial pulses are 1+ on the right side, and 1+ on the left side.  Pulmonary/Chest: Breath sounds normal. No respiratory distress. He has no wheezes.  Abdominal: Soft. Bowel sounds are normal. He exhibits no distension. There is no tenderness. There is no guarding and no CVA tenderness.  Musculoskeletal: He exhibits no edema.       Right shoulder: He exhibits no swelling and no deformity.  Neurological: He is alert and oriented to person, place, and time. He has normal strength and normal reflexes. No cranial nerve deficit or sensory deficit. Gait normal.  Skin: Skin is warm and dry. No rash noted. No cyanosis. Nails show no clubbing.  Psychiatric: He has a normal mood and affect. His speech is normal. Judgment normal. Cognition and memory are normal.      CMP ( most recent) CMP     Component Value Date/Time   NA 141 01/22/2018 0335   K 3.5 01/22/2018 0335   CL 108 01/22/2018 0335   CO2 25 01/22/2018 0335   GLUCOSE 154 (H) 01/22/2018 0335   BUN 9 01/22/2018 0335   CREATININE 0.62 01/22/2018 0335   CALCIUM 8.6 (L) 01/22/2018 0335   PROT 7.0 01/19/2018 1802   ALBUMIN 4.1 01/19/2018 1802    AST 17 01/19/2018 1802   ALT 17 01/19/2018 1802   ALKPHOS 49 01/19/2018 1802   BILITOT 1.4 (H) 01/19/2018 1802   GFRNONAA >60 01/22/2018 0335   GFRAA >60 01/22/2018 0335     Diabetic Labs (most recent): Lab Results  Component Value Date   HGBA1C 9.7 (H) 01/20/2018     Lipid Panel ( most recent) Lipid Panel     Component Value Date/Time   CHOL 168 01/20/2018 0247   TRIG 51 01/20/2018 0247   HDL 45 01/20/2018 0247   CHOLHDL 3.7 01/20/2018 0247   VLDL 10 01/20/2018 0247   LDLCALC 113 (H) 01/20/2018 0247    Assessment & Plan:   1. Uncontrolled type 2 diabetes mellitus with hyperglycemia ([place])  - Avi Kerschner has history of uncontrolled diabetes with A1c of 9.7% in May 2019.  Since he was started on basal insulin, over the last 4 weeks, he showed significant improvement to current average blood glucose of 134 over the last 2 weeks.    -  Recent labs reviewed.  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 These are all discussed in detail with the patient.  - I have counseled him on diet management  by adopting a carbohydrate restricted/protein rich diet.  - Suggestion is made for him to avoid simple carbohydrates  from his diet including Cakes, Sweet Desserts, Ice Cream, Soda (diet and regular), Sweet Tea, Candies, Chips, Cookies, Store Bought Juices, Alcohol in Excess of  1-2 drinks a day, Artificial Sweeteners, and "Sugar-free" Products. This will help patient to have stable blood glucose profile and potentially avoid unintended weight gain.  - I encouraged him to  switch to  unprocessed or minimally processed complex starch and increased protein intake (animal  or plant source), fruits, and vegetables.  - he is advised to stick to a routine mealtimes to eat 3 meals  a day and avoid unnecessary snacks ( to snack only to correct hypoglycemia).   - he will be scheduled with Jearld Fenton, RDN, CDE for individualized diabetes education.  - I have approached him with the following individualized plan to manage diabetes and patient agrees:   -He is not a suitable candidate for oral antidiabetic medications nor incretin therapy.  -He is appropriately being treated with insulin, and has achieved glycemia to near target over the last several weeks.  -I advised him to continue Lantus 20 units every morning, continue to document blood glucose 4 times a day-before meals and at bedtime. -Based on his current presentation, he will not require prandial insulin nor additional medications. -Patient is encouraged to call clinic for blood glucose levels less than 70 or above 300 mg /dl. -From diabetes standpoint, he is clear for the planned carotid artery revascularization.  - Patient specific target  A1c;  LDL, HDL, Triglycerides, and  Waist Circumference were discussed in detail. -Anti-GAD antibodies, anti-islet cell antibodies will be part of his next blood work. 2) BP/HTN: His blood pressure is controlled to target.  Patient is not on ACE inhibitors nor ARB.  He will not tolerate antihypertensive medications at this time. 3) Lipids/HPL:   His recent labs show LDL of 113.  He is advised to continue Lipitor 80 mg p.o. nightly.  4)  Weight/Diet: He does not have access weight to lose, CDE Consult will be initiated , exercise, and detailed carbohydrates information provided.  5) hypothyroidism -He is currently on NP thyroid 30 mg daily.  He will have Pete thyroid function test.  He will be considered for levothyroxine treatment as necessary after his next visit.  6)  Chronic Care/Health Maintenance:  -he  is on Statin medications and  is encouraged to continue to follow up with Ophthalmology, Dentist,  Podiatrist at least yearly or according to recommendations, and advised to  stay away from smoking. I have recommended yearly flu vaccine and pneumonia vaccination at least every 5 years; moderate intensity exercise for up to 150 minutes weekly; and  sleep for at least 7 hours a day.  - I advised patient to maintain close follow up with Doree Albee, MD for primary care needs.  - Time spent with the patient: 45 minutes, of which >50% was spent in obtaining information about his symptoms, reviewing his previous labs, evaluations, and treatments, counseling him about his diabetes, hypothyroidism, hyperlipidemia, and developing developing  plans for long term treatment based on the latest recommendations.  Dicky Doe participated in the discussions, expressed understanding, and voiced agreement with the above plans.  All questions were answered to his satisfaction. he is encouraged to contact clinic should he have any questions or concerns prior to his return visit.  Follow up plan: - Return in about 2 months (around 05/14/2018) for follow up with pre-visit labs, meter, and logs.  Glade Lloyd, MD [facility] [address] [place],  [address] Phone: [phone]  Fax: [phone]    03/05/2018, 11:07 AM  This note was partially dictated with voice recognition software. Similar sounding words can be transcribed inadequately or may not  be corrected upon review.

## 2018-03-05 NOTE — Patient Instructions (Signed)

## 2018-03-15 NOTE — Pre-Procedure Instructions (Addendum)
Mark Foster  03/15/2018      [facility] - [place], [place] - [address] [place] [address] Phone: [phone] Fax: [phone]    Your procedure is scheduled on July 18  Report to [place] at [place].M.  Call this number if you have problems the morning of surgery:  [phone]   Remember:  Do not eat or drink after midnight.     Take these medicines the morning of surgery with A SIP OF WATER  aspirin EC 325 clopidogrel (PLAVIX)  THYROID sertraline (ZOLOFT  7 days prior to surgery STOP taking any Aleve, Naproxen, Ibuprofen, Motrin, Advil, Goody's, BC's, all herbal medications, fish oil, and all vitamins   WHAT DO I DO ABOUT MY DIABETES MEDICATION?   Marland Kitchen Do not take oral diabetes medicines (pills) the morning of surgery.       . THE MORNING OF SURGERY, take _______10______ units of ___insulin glargine (LANTUS)_______insulin.  . The day of surgery, do not take other diabetes injectables, including Byetta (exenatide), Bydureon (exenatide ER), Victoza (liraglutide), or Trulicity (dulaglutide).  . If your CBG is greater than 220 mg/dL, you may take  of your sliding scale (correction) dose of insulin.   How to Manage Your Diabetes Before and After Surgery  Why is it important to control my blood sugar before and after surgery? . Improving blood sugar levels before and after surgery helps healing and can limit problems. . A way of improving blood sugar control is eating a healthy diet by: o  Eating less sugar and carbohydrates o  Increasing activity/exercise o  Talking with your doctor about reaching your blood sugar goals . High blood sugars (greater than 180 mg/dL) can raise your risk of infections and slow your recovery, so you will need to focus on controlling your diabetes during the weeks before surgery. . Make sure that the doctor who takes care of your  diabetes knows about your planned surgery including the date and location.  How do I manage my blood sugar before surgery? . Check your blood sugar at least 4 times a day, starting 2 days before surgery, to make sure that the level is not too high or low. o Check your blood sugar the morning of your surgery when you wake up and every 2 hours until you get to the Short Stay unit. . If your blood sugar is less than 70 mg/dL, you will need to treat for low blood sugar: o Do not take insulin. o Treat a low blood sugar (less than 70 mg/dL) with  cup of clear juice (cranberry or apple), 4 glucose tablets, OR glucose gel. o Recheck blood sugar in 15 minutes after treatment (to make sure it is greater than 70 mg/dL). If your blood sugar is not greater than 70 mg/dL on recheck, call [phone] for further instructions. . Report your blood sugar to the short stay nurse when you get to Short Stay.  . If you are admitted to the hospital after surgery: o Your blood sugar will be checked by the staff and you will probably be given insulin after surgery (instead of oral diabetes medicines) to make sure you have good blood sugar levels. o The goal for blood sugar control after surgery is 80-180 mg/dL.    Do not wear jewelry  Do not wear lotions, powders, or cologne, or deodorant.  Men may  shave face and neck.  Do not bring valuables to the hospital.  [facility] is not responsible for any belongings or valuables.  Contacts, dentures or bridgework may not be worn into surgery.  Leave your suitcase in the car.  After surgery it may be brought to your room.  For patients admitted to the hospital, discharge time will be determined by your treatment team.  Patients discharged the day of surgery will not be allowed to drive home.    Special instructions:   - Preparing For Surgery  Before surgery, you can play an important role. Because skin is not sterile, your skin needs to be as free of  germs as possible. You can reduce the number of germs on your skin by washing with CHG (chlorahexidine gluconate) Soap before surgery.  CHG is an antiseptic cleaner which kills germs and bonds with the skin to continue killing germs even after washing.    Oral Hygiene is also important to reduce your risk of infection.  Remember - BRUSH YOUR TEETH THE MORNING OF SURGERY WITH YOUR REGULAR TOOTHPASTE  Please do not use if you have an allergy to CHG or antibacterial soaps. If your skin becomes reddened/irritated stop using the CHG.  Do not shave (including legs and underarms) for at least 48 hours prior to first CHG shower. It is OK to shave your face.  Please follow these instructions carefully.   1. Shower the NIGHT BEFORE SURGERY and the MORNING OF SURGERY with CHG.   2. If you chose to wash your hair, wash your hair first as usual with your normal shampoo.  3. After you shampoo, rinse your hair and body thoroughly to remove the shampoo.  4. Use CHG as you would any other liquid soap. You can apply CHG directly to the skin and wash gently with a scrungie or a clean washcloth.   5. Apply the CHG Soap to your body ONLY FROM THE NECK DOWN.  Do not use on open wounds or open sores. Avoid contact with your eyes, ears, mouth and genitals (private parts). Wash Face and genitals (private parts)  with your normal soap.  6. Wash thoroughly, paying special attention to the area where your surgery will be performed.  7. Thoroughly rinse your body with warm water from the neck down.  8. DO NOT shower/wash with your normal soap after using and rinsing off the CHG Soap.  9. Pat yourself dry with a CLEAN TOWEL.  10. Wear CLEAN PAJAMAS to bed the night before surgery, wear comfortable clothes the morning of surgery  11. Place CLEAN SHEETS on your bed the night of your first shower and DO NOT SLEEP WITH PETS.    Day of Surgery:  Do not apply any deodorants/lotions.  Please wear clean clothes to the  hospital/surgery center.   Remember to brush your teeth WITH YOUR REGULAR TOOTHPASTE.    Please read over the following fact sheets that you were given.

## 2018-03-18 ENCOUNTER — Other Ambulatory Visit: Payer: Self-pay | Admitting: Student

## 2018-03-18 ENCOUNTER — Other Ambulatory Visit: Payer: Self-pay | Admitting: Radiology

## 2018-03-19 ENCOUNTER — Other Ambulatory Visit: Payer: Self-pay

## 2018-03-19 ENCOUNTER — Telehealth: Payer: Self-pay | Admitting: Student

## 2018-03-19 ENCOUNTER — Encounter (HOSPITAL_COMMUNITY): Payer: Self-pay

## 2018-03-19 ENCOUNTER — Encounter (HOSPITAL_COMMUNITY)
Admission: RE | Admit: 2018-03-19 | Discharge: 2018-03-19 | Disposition: A | Discharge status: Home / Self Care | Payer: BLUE CROSS/BLUE SHIELD | Source: Ambulatory Visit | Attending: Interventional Radiology | Admitting: Interventional Radiology

## 2018-03-19 DIAGNOSIS — Z7982 Long term (current) use of aspirin: Secondary | ICD-10-CM | POA: Insufficient documentation

## 2018-03-19 DIAGNOSIS — Z79899 Other long term (current) drug therapy: Secondary | ICD-10-CM | POA: Diagnosis not present

## 2018-03-19 DIAGNOSIS — E039 Hypothyroidism, unspecified: Secondary | ICD-10-CM | POA: Insufficient documentation

## 2018-03-19 DIAGNOSIS — D72819 Decreased white blood cell count, unspecified: Secondary | ICD-10-CM | POA: Diagnosis not present

## 2018-03-19 DIAGNOSIS — Z9889 Other specified postprocedural states: Secondary | ICD-10-CM | POA: Diagnosis not present

## 2018-03-19 DIAGNOSIS — M4802 Spinal stenosis, cervical region: Secondary | ICD-10-CM | POA: Insufficient documentation

## 2018-03-19 DIAGNOSIS — E1140 Type 2 diabetes mellitus with diabetic neuropathy, unspecified: Secondary | ICD-10-CM | POA: Insufficient documentation

## 2018-03-19 DIAGNOSIS — Z87442 Personal history of urinary calculi: Secondary | ICD-10-CM | POA: Diagnosis not present

## 2018-03-19 DIAGNOSIS — Z01812 Encounter for preprocedural laboratory examination: Secondary | ICD-10-CM | POA: Diagnosis present

## 2018-03-19 DIAGNOSIS — D696 Thrombocytopenia, unspecified: Secondary | ICD-10-CM | POA: Insufficient documentation

## 2018-03-19 DIAGNOSIS — I10 Essential (primary) hypertension: Secondary | ICD-10-CM | POA: Insufficient documentation

## 2018-03-19 DIAGNOSIS — Z794 Long term (current) use of insulin: Secondary | ICD-10-CM | POA: Insufficient documentation

## 2018-03-19 DIAGNOSIS — Z8673 Personal history of transient ischemic attack (TIA), and cerebral infarction without residual deficits: Secondary | ICD-10-CM | POA: Insufficient documentation

## 2018-03-19 HISTORY — DX: Type 2 diabetes mellitus with diabetic neuropathy, unspecified: E11.40

## 2018-03-19 HISTORY — DX: Hypothyroidism, unspecified: E03.9

## 2018-03-19 HISTORY — DX: Occlusion and stenosis of left vertebral artery: I65.02

## 2018-03-19 HISTORY — DX: Diverticulitis of intestine, part unspecified, without perforation or abscess without bleeding: K57.92

## 2018-03-19 HISTORY — DX: Personal history of urinary calculi: Z87.442

## 2018-03-19 LAB — CBC WITH DIFFERENTIAL/PLATELET
BASOS ABS: 0 10*3/uL (ref 0.0–0.1)
BASOS PCT: 0 %
Eosinophils Absolute: 0 10*3/uL (ref 0.0–0.7)
Eosinophils Relative: 1 %
HCT: 39.9 % (ref 39.0–52.0)
HEMOGLOBIN: 14 g/dL (ref 13.0–17.0)
LYMPHS PCT: 44 %
Lymphs Abs: 0.9 10*3/uL (ref 0.7–4.0)
MCH: 32.5 pg (ref 26.0–34.0)
MCHC: 35.1 g/dL (ref 30.0–36.0)
MCV: 92.6 fL (ref 78.0–100.0)
MONO ABS: 0.1 10*3/uL (ref 0.1–1.0)
Monocytes Relative: 4 %
NEUTROS PCT: 51 %
Neutro Abs: 1 10*3/uL — ABNORMAL LOW (ref 1.7–7.7)
PLATELETS: 103 10*3/uL — AB (ref 150–400)
RBC: 4.31 MIL/uL (ref 4.22–5.81)
RDW: 13.5 % (ref 11.5–15.5)
WBC: 2 10*3/uL — AB (ref 4.0–10.5)

## 2018-03-19 LAB — GLUCOSE, CAPILLARY: Glucose-Capillary: 167 mg/dL — ABNORMAL HIGH (ref 70–99)

## 2018-03-19 LAB — BASIC METABOLIC PANEL
ANION GAP: 7 (ref 5–15)
BUN: 20 mg/dL (ref 8–23)
CALCIUM: 9.4 mg/dL (ref 8.9–10.3)
CO2: 27 mmol/L (ref 22–32)
CREATININE: 0.6 mg/dL — AB (ref 0.61–1.24)
Chloride: 107 mmol/L (ref 98–111)
GFR calc non Af Amer: >60 mL/min (ref >60)
Glucose, Bld: 167 mg/dL — ABNORMAL HIGH (ref 70–99)
Potassium: 3.8 mmol/L (ref 3.5–5.1)
SODIUM: 141 mmol/L (ref 135–145)

## 2018-03-19 LAB — PLATELET INHIBITION P2Y12: Platelet Function  P2Y12: 198 [PRU] (ref 194–418)

## 2018-03-19 LAB — HEMOGLOBIN A1C
HEMOGLOBIN A1C: 6.5 % — AB (ref 4.8–5.6)
Mean Plasma Glucose: 139.85 mg/dL

## 2018-03-19 LAB — PROTIME-INR
INR: 1.08
PROTHROMBIN TIME: 13.9 s (ref 11.4–15.2)

## 2018-03-19 LAB — APTT: aPTT: 27 seconds (ref 24–36)

## 2018-03-19 NOTE — Telephone Encounter (Signed)
Received patient's P2Y12 level from today- 198 PRU. Patient is scheduled for cerebral angiogram with possible left vertebral artery stenosis angioplasty/stent placement 03/21/2018 with Dr. Estanislado Pandy. Patient is currently taking Plavix 75 mg once daily and Aspirin 325 mg once daily.  Informed patient of his P2Y12 level. Informed patient that based on this lab value, per Dr. Estanislado Pandy, we are going to change his medications. Instructed patient to discontinue Plavix use and Aspirin 325 mg use. Instructed patient to begin taking Brilinta 90 mg twice daily. Instructed patient to take one tablet tonight, and begin taking one tablet by mouth twice daily beginning tomorrow. Instructed patient to begin taking Aspirin 81 mg once daily. Confirmed pharmacy with patient- [facility] in [place], [place] off of [address].  Phoned in prescription to [facility] ([phone])- Brilinta 90 mg tablets, take one tablet by mouth twice daily, dispense 60 tablets with 3 refills.  All questions answered and concerns addressed. Patient conveys understanding and agrees with plan.  Bea Graff Louk, PA-C 03/19/2018, 4:28 PM

## 2018-03-19 NOTE — Progress Notes (Signed)
PCP - Dr. Hurshel Party  Endocrin- Dr. Loni Beckwith  Cardiologist - Denies  Chest x-ray - Denies  EKG - 01/21/18 (E)  Stress Test - Denies  ECHO - 01/2018 (E)  Cardiac Cath - Denies  Sleep Study - Yes- Negative CPAP - None  LABS- 03/19/18: CBC, BMP, P2Y12, PT, PTT  ASA- Continue Plavix-Continue  HA1C- 03/19/18 Fasting Blood Sugar - 94-148, Today 167 Checks Blood Sugar _3____ times a day  Anesthesia- Yes- surgical order  Pt denies having chest pain, sob, or fever at this time. All instructions explained to the pt, with a verbal understanding of the material. Pt agrees to go over the instructions while at home for a better understanding. The opportunity to ask questions was provided.

## 2018-03-19 NOTE — Progress Notes (Signed)
Message sent to Monia Sabal regarding pt's WBC 2.0. Will relay to anesthesia services as well.

## 2018-03-20 ENCOUNTER — Other Ambulatory Visit: Payer: Self-pay | Admitting: Radiology

## 2018-03-20 ENCOUNTER — Encounter (HOSPITAL_COMMUNITY): Payer: Self-pay | Admitting: Anesthesiology

## 2018-03-20 ENCOUNTER — Other Ambulatory Visit: Payer: Self-pay | Admitting: Student

## 2018-03-20 LAB — PATHOLOGIST SMEAR REVIEW

## 2018-03-20 NOTE — Progress Notes (Signed)
Anesthesia Chart Review:  Case:  [id number] Date/Time:  03/21/18 0815   Procedure:  IR WITH ANESTHESIA WITH STENT PLACEMENT (N/A )   Anesthesia type:  General   Pre-op diagnosis:  STENOSIS   Location:  [facility] OR RADIOLOGY ROOM / [place] OR   Surgeon:  Luanne Bras, MD      DISCUSSION: 62 yo male  Never smoker scheduled for above procedure. He has pertinent hx of HTN, hypothyroid, CVA, DMII, cervical spinal stenosis, leukopenia, thrombocytopenia  Pt was admitted to [facility] 5/18-5/21/2019 for Stroke-like episode - left MCA, likely due to left ICA occlusion with failed collaterals   Resultant Right hand weakness and numbness  CT head - No acute intracranial process. Old small LEFT parietal/MCA territory infarct.   MRI head- No acute intracranial abnormality identified. Chronic left posterior MCA infarct.   MRA head and neck- Occlusion of the left ICA. Multiple areas of moderate to severe stenosis, including tandem stenosis right ICA siphon and left ACA, and BA. B/l fetal PCAs  Cerebral angio with Dr. Estanislado Pandy :  L ICA occlusion w/ L VA 90% stenosis. Mult large vessel stenosis:  R side ICA, R MCA, BA stensos  MRInegative  2D EchoEF60-65%. No source of embolus  While hospitalized he was also found to have leukopenia and thrombocytopenia with no etiology identified, advised to f/u outpatient.   I spoke with pt's PCP [name] regarding labwork. He stated that in April the pt had a pancytopenia that he was preparing to work up but unfortunately the pt had CVA and this was not done. I relayed the pt's most recent lab values and it does appear that the values are stable. [name] did state he thinks the patient is safe to proceed with planned procedure and continue hematologic workup after surgery. I forwarded the pt's most recent labs to his office for review.  Anticipate pt can proceed with surgery as planned barring acute status change  VS: BP 118/67   Pulse 86   Temp 36.7 C   Resp  20   Ht 5\' 11"  (1.803 m)   Wt 148 lb 6.4 oz (67.3 kg)   SpO2 98%   BMI 20.70 kg/m   PROVIDERS: Doree Albee, MD is PCP  Antony Contras, MD is Neurologist last seen in office by Sherald Barge NP 02/22/2018   LABS: Leukopenic and thrombocytopenic. Discussed with PCP who is aware - labs stable since April according to PCP. Plans to continue workup after surgery.  (all labs ordered are listed, but only abnormal results are displayed)  Labs Reviewed  GLUCOSE, CAPILLARY - Abnormal; Notable for the following components:      Result Value   Glucose-Capillary 167 (*)    All other components within normal limits  BASIC METABOLIC PANEL - Abnormal; Notable for the following components:   Glucose, Bld 167 (*)    Creatinine, Ser 0.60 (*)    All other components within normal limits  CBC WITH DIFFERENTIAL/PLATELET - Abnormal; Notable for the following components:   WBC 2.0 (*)    Platelets 103 (*)    Neutro Abs 1.0 (*)    All other components within normal limits  HEMOGLOBIN A1C - Abnormal; Notable for the following components:   Hgb A1c MFr Bld 6.5 (*)    All other components within normal limits  APTT  PLATELET INHIBITION P2Y12  PROTIME-INR     IMAGES: Ct Head Code Stroke Wo Contrast 01/19/2018 1. No acute intracranial process.  2. ASPECTS is 10.  3. Old small LEFT parietal/MCA territory infarct.   MRI HEAD IMPRESSION  5/19/20190459 1. No acute intracranial abnormality identified.  2. Chronic left posterior MCA infarct.  3. Abnormal flow void within the left ICA, consistent with occlusion.See below.   MRA HEAD IMPRESSION  01/20/2018 1. 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 Left MCA is perfused via collateral flow cross the circle-of-Willis as well as from the posterior circulation, although flow within the left MCA overall somewhat attenuated as compared to the right.  2. Moderate tandem stenoses involving the proximal and mid cavernous right ICA.  3.  Focal severe proximal right M1 stenosis.  4. Focal severe mid left A1 stenosis. 5. Fetal type origin of the PCAs with overall diminutive vertebrobasilar system. Moderate tandem stenoses involving the mid and distal basilar artery.   MRA NECK IMPRESSION  01/20/2018 1. 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  2. Right carotid artery system widely patent within the neck without stenosis.  3. Suspected at least moderate stenoses at the origin of the vertebral arteries bilaterally. Vertebral arteries otherwise patent within the neck without stenosis or occlusion.  Mr Cervical Spine Wo Contrast 01/20/2018 1. Multilevel cervical spondylolysis with resultant moderate to severe diffuse spinal stenosis at C4-5 throughC6-7, most severe at C5-6.  2. Subtle T2 signal abnormality within the left aspect of the cervical spinal cord at C5-6, suspicious for myelomalacia related to disc disease and stenosis.  3. Multifactorial degenerative changes with resultant multilevel foraminal narrowing as above. Notable findings include severe bilateral C5 and C6 foraminal narrowing with moderate bilateral C7 foraminal stenosis.  Mr Brain Wo Contrast(repeat) 5/19/20191826 Negative for acute infarct or hemorrhage. No change from earlier today.  Cerebral angio 01/21/2018 1.Approx 65 to 70 % stenosis RT ICA cavernous seg. 2.Approx 50 To 70 Stenosis of RT MCA prox. 3.Approx 50 % stenosis of mid basilar artery. 4.Severe 90% plus stenosis of origin of LT [place]. 5.Chronically occluded LT ICA prox   EKG: 01/21/2018: Sinus rhythm  CV: ECHO 01/21/2018 Study Conclusions  - Left ventricle: The cavity size was normal. There was mild focal   basal hypertrophy of the septum. Systolic function was normal.   The estimated ejection fraction was in the range of 60% to 65%.   Wall motion was normal; there were no regional wall motion   abnormalities. Left ventricular diastolic function  parameters   were normal. - Mitral valve: Calcified annulus. There was trivial regurgitation. - Atrial septum: There was increased thickness of the septum,   consistent with lipomatous hypertrophy.  Past Medical History:  Diagnosis Date  . Cervical compression fracture ([place])   . Cervical spinal stenosis   . Diabetic neuropathy (HCC)    Bilateral legs  . Diverticulitis   . History of kidney stones   . Hypertension    No current medications  . Hypothyroidism   . Stenosis of left vertebral artery   . Stroke [facility])    2011  . Type 2 diabetes mellitus ([place])     Past Surgical History:  Procedure Laterality Date  . APPENDECTOMY    . COLON RESECTION     For diverticulitis  . COLON SURGERY    . IR ANGIO INTRA EXTRACRAN SEL COM CAROTID INNOMINATE BILAT MOD SED  01/21/2018  . IR ANGIO VERTEBRAL SEL VERTEBRAL BILAT MOD SED  01/21/2018  . ROTATOR CUFF REPAIR     Left    MEDICATIONS: . aspirin EC 325 MG EC tablet  . atorvastatin (LIPITOR)  80 MG tablet  . clopidogrel (PLAVIX) 75 MG tablet  . Continuous Blood Gluc Sensor (FREESTYLE LIBRE 14 DAY SENSOR) MISC  . ECHINACEA EXTRACT PO  . insulin glargine (LANTUS) 100 unit/mL SOPN  . Insulin Pen Needle 32G X 4 MM MISC  . NP THYROID 30 MG tablet  . sertraline (ZOLOFT) 50 MG tablet  . vitamin B-12 (CYANOCOBALAMIN) 500 MCG tablet   No current facility-administered medications for this encounter.      Wynonia Musty [facility] Short Stay Center/Anesthesiology Phone [phone] 03/20/2018 11:29 AM

## 2018-03-20 NOTE — Anesthesia Preprocedure Evaluation (Addendum)
Anesthesia Evaluation  Patient identified by MRN, date of birth, ID band Patient awake    Reviewed: Allergy & Precautions, NPO status , Patient's Chart, lab work & pertinent test results  Airway Mallampati: I  TM Distance: >3 FB Neck ROM: Full    Dental  (+) Upper Dentures, Lower Dentures, Dental Advisory Given   Pulmonary neg pulmonary ROS, former smoker,    breath sounds clear to auscultation       Cardiovascular hypertension, + Peripheral Vascular Disease   Rhythm:Regular Rate:Normal     Neuro/Psych "Hard time getting words out". CVA, Residual Symptoms negative psych ROS   GI/Hepatic negative GI ROS, Neg liver ROS,   Endo/Other  diabetes, Type 2, Insulin DependentHypothyroidism   Renal/GU      Musculoskeletal   Abdominal Normal abdominal exam  (+)   Peds  Hematology negative hematology ROS (+)   Anesthesia Other Findings   Reproductive/Obstetrics                           Anesthesia Physical Anesthesia Plan  ASA: III  Anesthesia Plan: General   Post-op Pain Management:    Induction: Intravenous  PONV Risk Score and Plan: 3 and Ondansetron, Dexamethasone and Midazolam  Airway Management Planned: Oral ETT  Additional Equipment: Arterial line  Intra-op Plan:   Post-operative Plan: Extubation in OR  Informed Consent: I have reviewed the patients History and Physical, chart, labs and discussed the procedure including the risks, benefits and alternatives for the proposed anesthesia with the patient or authorized representative who has indicated his/her understanding and acceptance.     Plan Discussed with: CRNA  Anesthesia Plan Comments:        Lab Results  Component Value Date   WBC 2.0 (L) 03/19/2018   HGB 14.0 03/19/2018   HCT 39.9 03/19/2018   MCV 92.6 03/19/2018   PLT 103 (L) 03/19/2018   Lab Results  Component Value Date   CREATININE 0.60 (L) 03/19/2018   BUN 20 03/19/2018   NA 141 03/19/2018   K 3.8 03/19/2018   CL 107 03/19/2018   CO2 27 03/19/2018   Lab Results  Component Value Date   INR 1.08 03/19/2018   INR 1.14 01/21/2018   INR 1.08 01/19/2018   EKG: NSR  Echo: - Left ventricle: The cavity size was normal. There was mild focal   basal hypertrophy of the septum. Systolic function was normal.   The estimated ejection fraction was in the range of 60% to 65%.   Wall motion was normal; there were no regional wall motion   abnormalities. Left ventricular diastolic function parameters   were normal. - Mitral valve: Calcified annulus. There was trivial regurgitation. - Atrial septum: There was increased thickness of the septum,   consistent with lipomatous hypertrophy.  Anesthesia Quick Evaluation

## 2018-03-21 ENCOUNTER — Ambulatory Visit (HOSPITAL_COMMUNITY): Payer: BLUE CROSS/BLUE SHIELD | Admitting: Certified Registered"

## 2018-03-21 ENCOUNTER — Observation Stay (HOSPITAL_COMMUNITY)
Admission: AD | Admit: 2018-03-21 | Discharge: 2018-03-22 | DRG: 039 | Disposition: A | Discharge status: Home / Self Care | Payer: BLUE CROSS/BLUE SHIELD | Attending: Interventional Radiology | Admitting: Interventional Radiology

## 2018-03-21 ENCOUNTER — Encounter (HOSPITAL_COMMUNITY): Payer: Self-pay

## 2018-03-21 ENCOUNTER — Ambulatory Visit (HOSPITAL_COMMUNITY)
Admission: RE | Admit: 2018-03-21 | Discharge: 2018-03-21 | Disposition: A | Discharge status: Home / Self Care | Payer: BLUE CROSS/BLUE SHIELD | Source: Ambulatory Visit | Attending: Interventional Radiology | Admitting: Interventional Radiology

## 2018-03-21 ENCOUNTER — Encounter (HOSPITAL_COMMUNITY): Payer: Self-pay | Admitting: Anesthesiology

## 2018-03-21 ENCOUNTER — Encounter (HOSPITAL_COMMUNITY)
Admission: AD | Disposition: A | Discharge status: Home / Self Care | Payer: Self-pay | Source: Home / Self Care | Attending: Interventional Radiology

## 2018-03-21 ENCOUNTER — Ambulatory Visit (HOSPITAL_COMMUNITY): Payer: BLUE CROSS/BLUE SHIELD | Admitting: Emergency Medicine

## 2018-03-21 DIAGNOSIS — Z8249 Family history of ischemic heart disease and other diseases of the circulatory system: Secondary | ICD-10-CM

## 2018-03-21 DIAGNOSIS — E039 Hypothyroidism, unspecified: Secondary | ICD-10-CM | POA: Diagnosis not present

## 2018-03-21 DIAGNOSIS — I6502 Occlusion and stenosis of left vertebral artery: Principal | ICD-10-CM | POA: Diagnosis present

## 2018-03-21 DIAGNOSIS — Z888 Allergy status to other drugs, medicaments and biological substances status: Secondary | ICD-10-CM | POA: Diagnosis not present

## 2018-03-21 DIAGNOSIS — I771 Stricture of artery: Secondary | ICD-10-CM

## 2018-03-21 DIAGNOSIS — Z8673 Personal history of transient ischemic attack (TIA), and cerebral infarction without residual deficits: Secondary | ICD-10-CM

## 2018-03-21 DIAGNOSIS — Z794 Long term (current) use of insulin: Secondary | ICD-10-CM | POA: Diagnosis not present

## 2018-03-21 DIAGNOSIS — Z87442 Personal history of urinary calculi: Secondary | ICD-10-CM

## 2018-03-21 DIAGNOSIS — Z7902 Long term (current) use of antithrombotics/antiplatelets: Secondary | ICD-10-CM | POA: Diagnosis not present

## 2018-03-21 DIAGNOSIS — I739 Peripheral vascular disease, unspecified: Secondary | ICD-10-CM | POA: Diagnosis present

## 2018-03-21 DIAGNOSIS — Z87891 Personal history of nicotine dependence: Secondary | ICD-10-CM | POA: Diagnosis not present

## 2018-03-21 DIAGNOSIS — I10 Essential (primary) hypertension: Secondary | ICD-10-CM | POA: Diagnosis not present

## 2018-03-21 DIAGNOSIS — E1140 Type 2 diabetes mellitus with diabetic neuropathy, unspecified: Secondary | ICD-10-CM | POA: Diagnosis not present

## 2018-03-21 DIAGNOSIS — I6521 Occlusion and stenosis of right carotid artery: Secondary | ICD-10-CM | POA: Diagnosis present

## 2018-03-21 HISTORY — PX: IR ANGIO INTRA EXTRACRAN SEL COM CAROTID INNOMINATE UNI R MOD SED: IMG5359

## 2018-03-21 HISTORY — PX: RADIOLOGY WITH ANESTHESIA: SHX6223

## 2018-03-21 HISTORY — PX: IR TRANSCATH EXCRAN VERT OR CAR A STENT: IMG1955

## 2018-03-21 LAB — POCT ACTIVATED CLOTTING TIME
Activated Clotting Time: 186 seconds
Activated Clotting Time: 191 seconds

## 2018-03-21 LAB — GLUCOSE, CAPILLARY
Glucose-Capillary: 116 mg/dL — ABNORMAL HIGH (ref 70–99)
Glucose-Capillary: 143 mg/dL — ABNORMAL HIGH (ref 70–99)
Glucose-Capillary: 145 mg/dL — ABNORMAL HIGH (ref 70–99)
Glucose-Capillary: 147 mg/dL — ABNORMAL HIGH (ref 70–99)
Glucose-Capillary: 182 mg/dL — ABNORMAL HIGH (ref 70–99)

## 2018-03-21 LAB — APTT: aPTT: 50 seconds — ABNORMAL HIGH (ref 24–36)

## 2018-03-21 LAB — PLATELET INHIBITION P2Y12: PLATELET FUNCTION P2Y12: 103 [PRU] — AB (ref 194–418)

## 2018-03-21 LAB — HEPARIN LEVEL (UNFRACTIONATED)

## 2018-03-21 LAB — MRSA PCR SCREENING: MRSA BY PCR: NEGATIVE

## 2018-03-21 SURGERY — IR WITH ANESTHESIA
Anesthesia: Monitor Anesthesia Care

## 2018-03-21 MED ORDER — CLOPIDOGREL BISULFATE 75 MG PO TABS
75.0000 mg | ORAL_TABLET | ORAL | Status: DC
  Filled 2018-03-21 (×2): qty 1

## 2018-03-21 MED ORDER — CLEVIDIPINE BUTYRATE 0.5 MG/ML IV EMUL: 0.0000 mg/h | INTRAVENOUS | Status: DC

## 2018-03-21 MED ORDER — HEPARIN (PORCINE) IN NACL 100-0.45 UNIT/ML-% IJ SOLN
650.0000 [IU]/h | INTRAMUSCULAR | Status: DC
  Filled 2018-03-21: qty 250

## 2018-03-21 MED ORDER — SODIUM CHLORIDE 0.9 % IV SOLN
INTRAVENOUS | Status: DC
  Administered 2018-03-21 (×2): via INTRAVENOUS

## 2018-03-21 MED ORDER — ACETAMINOPHEN 650 MG RE SUPP: 650.0000 mg | RECTAL | Status: DC | PRN

## 2018-03-21 MED ORDER — TICAGRELOR 90 MG PO TABS
90.0000 mg | ORAL_TABLET | Freq: Two times a day (BID) | ORAL | Status: DC
  Administered 2018-03-21 – 2018-03-22 (×2): 90 mg via ORAL
  Filled 2018-03-21 (×2): qty 1

## 2018-03-21 MED ORDER — ASPIRIN 81 MG PO CHEW: 81.0000 mg | CHEWABLE_TABLET | Freq: Every day | ORAL | Status: DC

## 2018-03-21 MED ORDER — MIDAZOLAM HCL 5 MG/5ML IJ SOLN
INTRAMUSCULAR | Status: DC | PRN
  Administered 2018-03-21: 1 mg via INTRAVENOUS

## 2018-03-21 MED ORDER — IOHEXOL 300 MG/ML  SOLN: 110.0000 mL | Freq: Once | INTRAMUSCULAR | Status: DC | PRN

## 2018-03-21 MED ORDER — HEPARIN (PORCINE) IN NACL 100-0.45 UNIT/ML-% IJ SOLN
INTRAMUSCULAR | Status: AC
  Administered 2018-03-21: 500 [IU]/h via INTRAVENOUS
  Filled 2018-03-21: qty 250

## 2018-03-21 MED ORDER — ACETAMINOPHEN 325 MG PO TABS
650.0000 mg | ORAL_TABLET | ORAL | Status: DC | PRN
  Administered 2018-03-21 – 2018-03-22 (×2): 650 mg via ORAL
  Filled 2018-03-21 (×2): qty 2

## 2018-03-21 MED ORDER — ASPIRIN 81 MG PO CHEW
81.0000 mg | CHEWABLE_TABLET | Freq: Every day | ORAL | Status: DC
  Administered 2018-03-22: 81 mg via ORAL
  Filled 2018-03-21: qty 1

## 2018-03-21 MED ORDER — SODIUM CHLORIDE 0.9 % IV SOLN
INTRAVENOUS | Status: DC
  Administered 2018-03-21 (×2): via INTRAVENOUS

## 2018-03-21 MED ORDER — ACETAMINOPHEN 160 MG/5ML PO SOLN: 650.0000 mg | ORAL | Status: DC | PRN

## 2018-03-21 MED ORDER — TICAGRELOR 90 MG PO TABS
90.0000 mg | ORAL_TABLET | Freq: Once | ORAL | Status: AC
  Administered 2018-03-21: 90 mg via ORAL
  Filled 2018-03-21: qty 1

## 2018-03-21 MED ORDER — INSULIN ASPART 100 UNIT/ML ~~LOC~~ SOLN
0.0000 [IU] | SUBCUTANEOUS | Status: DC
  Administered 2018-03-21 – 2018-03-22 (×2): 1 [IU] via SUBCUTANEOUS
  Administered 2018-03-22: 3 [IU] via SUBCUTANEOUS

## 2018-03-21 MED ORDER — HEPARIN (PORCINE) IN NACL 100-0.45 UNIT/ML-% IJ SOLN
600.0000 [IU]/h | INTRAMUSCULAR | Status: DC
  Administered 2018-03-21: 600 [IU]/h via INTRAVENOUS
  Filled 2018-03-21: qty 250

## 2018-03-21 MED ORDER — HEPARIN (PORCINE) IN NACL 100-0.45 UNIT/ML-% IJ SOLN
500.0000 [IU]/h | INTRAMUSCULAR | Status: DC
  Administered 2018-03-21: 500 [IU]/h via INTRAVENOUS

## 2018-03-21 MED ORDER — HEPARIN (PORCINE) IN NACL 100-0.45 UNIT/ML-% IJ SOLN
650.0000 [IU]/h | INTRAMUSCULAR | Status: AC
  Filled 2018-03-21: qty 250

## 2018-03-21 MED ORDER — LIDOCAINE HCL 1 % IJ SOLN
INTRAMUSCULAR | Status: AC
  Filled 2018-03-21: qty 20

## 2018-03-21 MED ORDER — LIDOCAINE HCL (PF) 1 % IJ SOLN
INTRAMUSCULAR | Status: DC | PRN
  Administered 2018-03-21: 10 mL

## 2018-03-21 MED ORDER — TICAGRELOR 90 MG PO TABS
ORAL_TABLET | ORAL | Status: AC
  Filled 2018-03-21: qty 1

## 2018-03-21 MED ORDER — ASPIRIN EC 325 MG PO TBEC
325.0000 mg | DELAYED_RELEASE_TABLET | ORAL | Status: DC
  Filled 2018-03-21 (×2): qty 1

## 2018-03-21 MED ORDER — TICAGRELOR 90 MG PO TABS: 90.0000 mg | ORAL_TABLET | Freq: Two times a day (BID) | ORAL | Status: DC

## 2018-03-21 MED ORDER — NIMODIPINE 30 MG PO CAPS
0.0000 mg | ORAL_CAPSULE | ORAL | Status: AC
  Administered 2018-03-21: 30 mg via ORAL
  Filled 2018-03-21: qty 1
  Filled 2018-03-21: qty 2

## 2018-03-21 MED ORDER — CEFAZOLIN SODIUM-DEXTROSE 2-4 GM/100ML-% IV SOLN
2.0000 g | INTRAVENOUS | Status: AC
  Administered 2018-03-21: 2 g via INTRAVENOUS
  Filled 2018-03-21 (×2): qty 100

## 2018-03-21 MED ORDER — ONDANSETRON HCL 4 MG/2ML IJ SOLN
INTRAMUSCULAR | Status: DC | PRN
  Administered 2018-03-21: 4 mg via INTRAVENOUS

## 2018-03-21 MED ORDER — NITROGLYCERIN 1 MG/10 ML FOR IR/CATH LAB
INTRA_ARTERIAL | Status: AC
  Filled 2018-03-21: qty 10

## 2018-03-21 MED ORDER — HEPARIN SODIUM (PORCINE) 1000 UNIT/ML IJ SOLN
INTRAMUSCULAR | Status: DC | PRN
  Administered 2018-03-21: 1000 [IU] via INTRAVENOUS
  Administered 2018-03-21: 500 [IU] via INTRAVENOUS
  Administered 2018-03-21: 2000 [IU] via INTRAVENOUS

## 2018-03-21 MED ORDER — KETOROLAC TROMETHAMINE 30 MG/ML IJ SOLN: 30.0000 mg | Freq: Three times a day (TID) | INTRAMUSCULAR | Status: DC | PRN

## 2018-03-21 MED ORDER — FENTANYL CITRATE (PF) 100 MCG/2ML IJ SOLN
INTRAMUSCULAR | Status: DC | PRN
  Administered 2018-03-21 (×4): 25 ug via INTRAVENOUS

## 2018-03-21 MED ORDER — KETOROLAC TROMETHAMINE 30 MG/ML IJ SOLN
30.0000 mg | Freq: Three times a day (TID) | INTRAMUSCULAR | Status: DC | PRN
  Administered 2018-03-21: 30 mg via INTRAVENOUS
  Filled 2018-03-21: qty 1

## 2018-03-21 NOTE — H&P (Signed)
Chief Complaint: Patient was seen in consultation today for cerebral arteriogram with possible left vertebral artery angioplasty/stent placement   Referring Physician(s): Dr Royal Hawthorn   Supervising Physician: Luanne Bras  Patient Status: [facility] - Out-pt  History of Present Illness: Mark Foster is a 62 y.o. male   CVA x 2; DM To ED 01/2018 with right sided symptoms: New CVA Imaging revealed R ICA stenosis and severe L VA stenosis Was in consult with Dr Estanislado Pandy 01/29/18 per Dr Leonie Man order Scheduled now for cerebral arteriogram with possible left vertebral artery angioplasty/stent placement  P2y12  198 7/16; changed to Brilinta 90 mg BID--- now P2y12 103 today   Past Medical History:  Diagnosis Date  . Cervical compression fracture ([place])   . Cervical spinal stenosis   . Diabetic neuropathy (HCC)    Bilateral legs  . Diverticulitis   . History of kidney stones   . Hypertension    No current medications  . Hypothyroidism   . Stenosis of left vertebral artery   . Stroke [facility])    2011  . Type 2 diabetes mellitus ([place])     Past Surgical History:  Procedure Laterality Date  . APPENDECTOMY    . COLON RESECTION     For diverticulitis  . COLON SURGERY    . IR ANGIO INTRA EXTRACRAN SEL COM CAROTID INNOMINATE BILAT MOD SED  01/21/2018  . IR ANGIO VERTEBRAL SEL VERTEBRAL BILAT MOD SED  01/21/2018  . ROTATOR CUFF REPAIR     Left    Allergies: Demerol [meperidine hcl]  Medications: Prior to Admission medications   Medication Sig Start Date End Date Taking? Authorizing Provider  aspirin EC 325 MG EC tablet Take 1 tablet (325 mg total) by mouth daily. 01/23/18   Donzetta Starch, NP  atorvastatin (LIPITOR) 80 MG tablet Take 1 tablet (80 mg total) by mouth daily at 6 PM. 01/22/18   Donzetta Starch, NP  clopidogrel (PLAVIX) 75 MG tablet Take 1 tablet (75 mg total) by mouth daily. 01/23/18   Donzetta Starch, NP  Continuous Blood Gluc Sensor (FREESTYLE LIBRE 14 DAY SENSOR) MISC  1 Device by Does not apply route every 14 (fourteen) days. 01/22/18   Donzetta Starch, NP  ECHINACEA EXTRACT PO Take 760 mg by mouth daily.    [provider]  insulin glargine (LANTUS) 100 unit/mL SOPN Inject 0.1 mLs (10 Units total) into the skin at bedtime. Patient taking differently: Inject 20 Units into the skin every morning.  01/22/18   Donzetta Starch, NP  Insulin Pen Needle 32G X 4 MM MISC 1 Device by Does not apply route daily. 01/22/18   Donzetta Starch, NP  NP THYROID 30 MG tablet Take 30 mg by mouth daily before breakfast.  01/17/18   [provider]  sertraline (ZOLOFT) 50 MG tablet Take 50 mg by mouth every morning.     [provider]  ticagrelor (BRILINTA) 90 MG TABS tablet Take 90 mg by mouth 2 (two) times daily.    [provider]  vitamin B-12 (CYANOCOBALAMIN) 500 MCG tablet Take 500 mcg by mouth daily.    [provider]     Family History  Problem Relation Age of Onset  . Stroke Mother     Social History   Socioeconomic History  . Marital status: Single    Spouse name: Not on file  . Number of children: Not on file  . Years of education: Not on file  .  Highest education level: Not on file  Occupational History  . Not on file  Social Needs  . Financial resource strain: Not on file  . Food insecurity:    Worry: Not on file    Inability: Not on file  . Transportation needs:    Medical: Not on file    Non-medical: Not on file  Tobacco Use  . Smoking status: Never Smoker  . Smokeless tobacco: Never Used  Substance and Sexual Activity  . Alcohol use: Not Currently  . Drug use: Never  . Sexual activity: Not on file  Lifestyle  . Physical activity:    Days per week: Not on file    Minutes per session: Not on file  . Stress: Not on file  Relationships  . Social connections:    Talks on phone: Not on file    Gets together: Not on file    Attends religious service: Not on file    Active member of club or organization:  Not on file    Attends meetings of clubs or organizations: Not on file    Relationship status: Not on file  Other Topics Concern  . Not on file  Social History Narrative  . Not on file    Review of Systems: A 12 point ROS discussed and pertinent positives are indicated in the HPI above.  All other systems are negative.  Review of Systems  Constitutional: Negative for activity change, appetite change, fatigue and fever.  HENT: Negative for tinnitus, trouble swallowing and voice change.   Eyes: Negative for visual disturbance.  Respiratory: Negative for cough and shortness of breath.   Cardiovascular: Negative for chest pain.  Gastrointestinal: Negative for abdominal pain.  Musculoskeletal: Negative for back pain and gait problem.  Neurological: Positive for weakness. Negative for dizziness, tremors, seizures, syncope, facial asymmetry, speech difficulty, light-headedness, numbness and headaches.  Psychiatric/Behavioral: Negative for behavioral problems and confusion.    Vital Signs: There were no vitals taken for this visit.  Physical Exam  Constitutional: He is oriented to person, place, and time. He appears well-nourished.  HENT:  Head: Atraumatic.  Eyes: EOM are normal.  Neck: Neck supple.  Cardiovascular: Normal rate, regular rhythm and normal heart sounds.  Pulmonary/Chest: Effort normal and breath sounds normal.  Abdominal: Soft. Bowel sounds are normal.  Musculoskeletal: Normal range of motion.  Neurological: He is alert and oriented to person, place, and time.  Skin: Skin is warm and dry.  Psychiatric: He has a normal mood and affect. His behavior is normal. Judgment and thought content normal.  Nursing note and vitals reviewed.   Imaging: No results found.  Labs:  CBC: Recent Labs    01/20/18 0247 01/21/18 0254 01/22/18 0335 03/19/18 1208  WBC 2.7* 2.7* 2.6* 2.0*  HGB 12.6* 13.1 13.1 14.0  HCT 35.5* 37.4* 37.0* 39.9  PLT 88* 89* 91* 103*     COAGS: Recent Labs    01/19/18 1802 01/21/18 0254 03/19/18 1208  INR 1.08 1.14 1.08  APTT 27  --  27    BMP: Recent Labs    01/19/18 1802 01/19/18 1811 01/21/18 0254 01/22/18 0335 03/19/18 1208  NA 133* 138 140 141 141  K 3.6 3.6 4.0 3.5 3.8  CL 100* 101 107 108 107  CO2 26  --  26 25 27   GLUCOSE 233* 226* 244* 154* 167*  BUN 20 20 10 9 20   CALCIUM 9.1  --  8.5* 8.6* 9.4  CREATININE 0.69 0.70 0.68 0.62 0.60*  GFRNONAA >60  --  >60 >60 >60  GFRAA >60  --  >60 >60 >60    LIVER FUNCTION TESTS: Recent Labs    01/19/18 1802  BILITOT 1.4*  AST 17  ALT 17  ALKPHOS 49  PROT 7.0  ALBUMIN 4.1    TUMOR MARKERS: No results for input(s): AFPTM, CEA, CA199, CHROMGRNA in the last 8760 hours.  Assessment and Plan:  CVA x 3 Most recently 01/2018 Imaging shows R ICA and severe L VA stenosis Now scheduled for cerebral arteriogram with possible L Vertebral artery angioplasty/stent placement Risks and benefits of cerebral angiogram with intervention were discussed with the patient including, but not limited to bleeding, infection, vascular injury, contrast induced renal failure, stroke or even death.  This interventional procedure involves the use of X-rays and because of the nature of the planned procedure, it is possible that we will have prolonged use of X-ray fluoroscopy.  Potential radiation risks to you include (but are not limited to) the following: - A slightly elevated risk for cancer  several years later in life. This risk is typically less than 0.5% percent. This risk is low in comparison to the normal incidence of human cancer, which is 33% for women and 50% for men according to the [place]. - Radiation induced injury can include skin redness, resembling a rash, tissue breakdown / ulcers and hair loss (which can be temporary or permanent).   The likelihood of either of these occurring depends on the difficulty of the procedure and whether you  are sensitive to radiation due to previous procedures, disease, or genetic conditions.   IF your procedure requires a prolonged use of radiation, you will be notified and given written instructions for further action.  It is your responsibility to monitor the irradiated area for the 2 weeks following the procedure and to notify your physician if you are concerned that you have suffered a radiation induced injury.    All of the patient's questions were answered, patient is agreeable to proceed.  Consent signed and in chart.  Thank you for this interesting consult.  I greatly enjoyed meeting Nizar Cutler and look forward to participating in their care.  A copy of this report was sent to the requesting provider on this date.  Electronically Signed: Lavonia Drafts, PA-C 03/21/2018, 8:03 AM   I spent a total of    25 Minutes in face to face in clinical consultation, greater than 50% of which was counseling/coordinating care for left vertebral artery intervention

## 2018-03-21 NOTE — Anesthesia Postprocedure Evaluation (Signed)
Anesthesia Post Note  Patient: Mark Foster  Procedure(s) Performed: IR WITH ANESTHESIA WITH STENT PLACEMENT (N/A )     Patient location during evaluation: PACU Anesthesia Type: MAC Level of consciousness: awake and alert Pain management: pain level controlled Vital Signs Assessment: post-procedure vital signs reviewed and stable Respiratory status: spontaneous breathing, nonlabored ventilation, respiratory function stable and patient connected to nasal cannula oxygen Cardiovascular status: stable and blood pressure returned to baseline Postop Assessment: no apparent nausea or vomiting Anesthetic complications: no    Last Vitals:  Vitals:   03/21/18 1135 03/21/18 1150  BP: 102/72 107/66  Pulse: 85 82  Resp: 13 11  Temp:    SpO2: 97% 98%    Last Pain:  Vitals:   03/21/18 1150  TempSrc:   PainSc: 0-No pain                 Effie Berkshire

## 2018-03-21 NOTE — Transfer of Care (Signed)
Immediate Anesthesia Transfer of Care Note  Patient: Mark Foster  Procedure(s) Performed: IR WITH ANESTHESIA WITH STENT PLACEMENT (N/A )  Patient Location: PACU  Anesthesia Type:MAC  Level of Consciousness: awake, alert , oriented and patient cooperative  Airway & Oxygen Therapy: Patient Spontanous Breathing and Patient connected to nasal cannula oxygen  Post-op Assessment: Report given to RN and Post -op Vital signs reviewed and stable  Post vital signs: Reviewed  Last Vitals:  Vitals Value Taken Time  BP 102/73 03/21/2018 11:21 AM  Temp    Pulse 77 03/21/2018 11:31 AM  Resp 16 03/21/2018 11:31 AM  SpO2 98 % 03/21/2018 11:31 AM  Vitals shown include unvalidated device data.  Last Pain:  Vitals:   03/21/18 0627  TempSrc: Oral         Complications: No apparent anesthesia complications

## 2018-03-21 NOTE — Progress Notes (Signed)
PT had some slight oozing from the tract of the earlier procedure this morning.  I placed a V-pad to achieve hemostasis, applied a new pressure dressing and applied a 10lb sandbag to the puncture sight.  Hemostasis was achieved at 15:40.

## 2018-03-21 NOTE — Anesthesia Procedure Notes (Signed)
Arterial Line Insertion Start/End7/18/2019 8:00 AM, 03/21/2018 8:10 AM Performed by: Jenne Campus, CRNA, CRNA  Patient location: Pre-op. Preanesthetic checklist: patient identified, IV checked, site marked, risks and benefits discussed, surgical consent, monitors and equipment checked, pre-op evaluation, timeout performed and anesthesia consent Lidocaine 1% used for infiltration Left, radial was placed Catheter size: 20 G Hand hygiene performed  and maximum sterile barriers used   Attempts: 1 Procedure performed without using ultrasound guided technique. Following insertion, dressing applied and Biopatch. Post procedure assessment: normal  Patient tolerated the procedure well with no immediate complications.

## 2018-03-21 NOTE — Procedures (Signed)
S/P rt common carotid and Lt Vertebral arteriograms followed by stet assisted angioplasty of  Lt VA origin  severestenosis

## 2018-03-21 NOTE — Anesthesia Procedure Notes (Signed)
Procedure Name: MAC Date/Time: 03/21/2018 8:50 AM Performed by: Jenne Campus, CRNA Pre-anesthesia Checklist: Patient identified, Emergency Drugs available, Suction available and Patient being monitored Oxygen Delivery Method: Nasal cannula

## 2018-03-21 NOTE — Progress Notes (Addendum)
ANTICOAGULATION CONSULT NOTE - Initial Consult  Pharmacy Consult for heparin Indication: post-interventional neuroradiological procedure  Allergies  Allergen Reactions  . Demerol [Meperidine Hcl] Other (See Comments)    convulsions    Patient Measurements: Height: 5\' 11"  (180.3 cm) Weight: 148 lb (67.1 kg) IBW/kg (Calculated) : 75.3 Heparin Dosing Weight: 67.1 kg  Vital Signs: Temp: 98.5 F (36.9 C) (07/18 1322) Temp Source: Oral (07/18 1322) BP: 105/68 (07/18 1250) Pulse Rate: 78 (07/18 1250)  Labs: Recent Labs    03/19/18 1208  HGB 14.0  HCT 39.9  PLT 103*  APTT 27  LABPROT 13.9  INR 1.08  CREATININE 0.60*    Estimated Creatinine Clearance: 90.9 mL/min (A) (by C-G formula based on SCr of 0.6 mg/dL (L)).   Medical History: Past Medical History:  Diagnosis Date  . Cervical compression fracture ([place])   . Cervical spinal stenosis   . Diabetic neuropathy (H[facility])    Bilateral legs  . Diverticulitis   . History of kidney stones   . Hypertension    No current medications  . Hypothyroidism   . Stenosis of left vertebral artery   . Stroke [facility])    2011  . Type 2 diabetes mellitus [facility])     Assessment: 62 year old male s/p IR with stent placement. Starting patient on heparin infusion ASAP per orders.  Goal of Therapy:  Heparin level 0.1 - 0.25 units/ml Monitor platelets by anticoagulation protocol: Yes   Plan:  Start heparin infusion at 600 units/hr Check anti-Xa level in 6 hours and daily while on heparin Continue to monitor H&H and platelets  Britt Boozer, PharmD PGY1 Pharmacy Resident 03/21/2018,1:26 PM

## 2018-03-21 NOTE — Progress Notes (Addendum)
Saw patient in neuro ICU following procedure. Patient underwent an image-guided cerebral angiogram with stent assisted angioplasty of left vertebral artery stenosis.  Patient awake and alert laying in bed. Complains of back pain. States it is due to him laying flat for so long. Denies headache, weakness, numbness/tingling, dizziness, vision changes, hearing changes, tinnitus, or speech difficulty.  Right groin incision oozing upon arrival to neuro ICU. Josh, RT achieved hemostasis at 15:40 and new pressure dressing applied to puncture site along with 10 pound sandbag.  Alert, awake, and oriented x3. Speech and comprehension intact. PERRL bilaterally. EOMs intact bilaterally without nystagmus or subjective diplopia. Visual fields not assessed. No facial asymmetry. Tongue midline. Motor power symmetric proportional to effort. No pronator drift. Fine motor and coordination intact and symmetric. Gait not assessed. Romberg not assessed. Heel to toe not assessed. Distal pulses palpable with doppler bilaterally. Right groin incision soft without active bleeding or hematoma.  Plan to stay in neuro ICU for overnight observation. Patient to remain flat until 19:40, then HOB to 30 degrees. Heparin to be held for 1 hour- verbal given to RN for timing. APTT pending. Continue with hourly BP and neuro checks. Continue taking Brilinta 90 mg twice daily and Aspirin 81 mg once daily. IR to follow.  Bea Graff Louk, PA-C 03/21/2018, 4:10 PM

## 2018-03-21 NOTE — Progress Notes (Addendum)
ANTICOAGULATION CONSULT NOTE - Follow Up Consult  Pharmacy Consult for Heparin Indication: post-interventional neuroradiological procedure  Allergies  Allergen Reactions  . Demerol [Meperidine Hcl] Other (See Comments)    convulsions    Patient Measurements: Height: 5\' 11"  (180.3 cm) Weight: 148 lb (67.1 kg) IBW/kg (Calculated) : 75.3 Heparin Dosing Weight: 67.1 kg  Vital Signs: Temp: 99.5 F (37.5 C) (07/18 2000) Temp Source: Oral (07/18 2000) BP: 107/58 (07/18 2100) Pulse Rate: 76 (07/18 2105)  Labs: Recent Labs    03/19/18 1208 03/21/18 1530 03/21/18 2110  HGB 14.0  --   --   HCT 39.9  --   --   PLT 103*  --   --   APTT 27 50*  --   LABPROT 13.9  --   --   INR 1.08  --   --   HEPARINUNFRC  --   --  <0.10*  CREATININE 0.60*  --   --     Estimated Creatinine Clearance: 90.9 mL/min (A) (by C-G formula based on SCr of 0.6 mg/dL (L)).   Medications:  Infusions:  . sodium chloride 75 mL/hr at 03/21/18 2100  . clevidipine Stopped (03/21/18 1643)  . heparin 600 Units/hr (03/21/18 2100)    Assessment: 62 year old male s/p IR with stent placement. Starting patient on heparin infusion ASAP per orders.  Heparin level <0.1 on 600 units/hr.   Goal of Therapy:  Heparin level 0.1 to 0.25 units/ml Monitor platelets by anticoagulation protocol: Yes   Plan:  Increase Heparin to 650 units/hr.  Heparin to stop in AM.   Brain Hilts 03/21/2018,10:09 PM

## 2018-03-21 NOTE — H&P (Deleted)
  The note originally documented on this encounter has been moved the the encounter in which it belongs.  

## 2018-03-22 ENCOUNTER — Encounter (HOSPITAL_COMMUNITY): Payer: Self-pay | Admitting: Interventional Radiology

## 2018-03-22 DIAGNOSIS — I6502 Occlusion and stenosis of left vertebral artery: Secondary | ICD-10-CM | POA: Diagnosis not present

## 2018-03-22 LAB — GLUCOSE, CAPILLARY
Glucose-Capillary: 123 mg/dL — ABNORMAL HIGH (ref 70–99)
Glucose-Capillary: 107 mg/dL — ABNORMAL HIGH (ref 70–99)
Glucose-Capillary: 245 mg/dL — ABNORMAL HIGH (ref 70–99)

## 2018-03-22 LAB — CBC WITH DIFFERENTIAL/PLATELET
BASOS ABS: 0 10*3/uL (ref 0.0–0.1)
BASOS PCT: 1 %
EOS PCT: 2 %
Eosinophils Absolute: 0 10*3/uL (ref 0.0–0.7)
HCT: 33.5 % — ABNORMAL LOW (ref 39.0–52.0)
HEMOGLOBIN: 11.5 g/dL — AB (ref 13.0–17.0)
LYMPHS ABS: 1.2 10*3/uL (ref 0.7–4.0)
LYMPHS PCT: 54 %
MCH: 31.9 pg (ref 26.0–34.0)
MCHC: 34.3 g/dL (ref 30.0–36.0)
MCV: 93.1 fL (ref 78.0–100.0)
MONOS PCT: 6 %
Monocytes Absolute: 0.1 10*3/uL (ref 0.1–1.0)
NEUTROS ABS: 0.7 10*3/uL — AB (ref 1.7–7.7)
Neutrophils Relative %: 37 %
Platelets: 80 10*3/uL — ABNORMAL LOW (ref 150–400)
RBC: 3.6 MIL/uL — ABNORMAL LOW (ref 4.22–5.81)
RDW: 13.6 % (ref 11.5–15.5)
Smear Review: DECREASED
WBC: 2 10*3/uL — ABNORMAL LOW (ref 4.0–10.5)

## 2018-03-22 LAB — BASIC METABOLIC PANEL
Anion gap: 8 (ref 5–15)
BUN: 14 mg/dL (ref 8–23)
CO2: 24 mmol/L (ref 22–32)
Calcium: 8.4 mg/dL — ABNORMAL LOW (ref 8.9–10.3)
Chloride: 109 mmol/L (ref 98–111)
Creatinine, Ser: 0.61 mg/dL (ref 0.61–1.24)
GFR calc Af Amer: >60 mL/min (ref >60)
GLUCOSE: 130 mg/dL — AB (ref 70–99)
POTASSIUM: 3.4 mmol/L — AB (ref 3.5–5.1)
Sodium: 141 mmol/L (ref 135–145)

## 2018-03-22 MED ORDER — ASPIRIN 81 MG PO TABS: 81.0000 mg | ORAL_TABLET | Freq: Every day | ORAL | 3 refills | Status: DC

## 2018-03-22 MED ORDER — INSULIN GLARGINE 100 UNIT/ML ~~LOC~~ SOLN
20.0000 [IU] | Freq: Every day | SUBCUTANEOUS | Status: DC
  Administered 2018-03-22: 20 [IU] via SUBCUTANEOUS
  Filled 2018-03-22: qty 0.2

## 2018-03-22 NOTE — Progress Notes (Signed)
1500 assessed groin site. New drainage noted on gauze at right groin site. Held pressure for 15 minutes. Deveshwar paged. Held pressure for another 15 minutes. Sand bag applied and APTT send to lab per MD order.  [place] to bedside. Vpad applied. Orders to hold heparin gtt for 1 hour per Dr. Estanislado Pandy.   MD to bedside to assess at 1730. Orders for patient to remain HOB flat and right leg straight until tomorrow morning at rounds.

## 2018-03-22 NOTE — Discharge Summary (Signed)
Patient ID: Mark Foster MRN: [id number] DOB/AGE: 1955/10/22 62 y.o.  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 Estanislado Pandy. Procedure occurred without major complications and patient was transferred to neruo ICU in stable condition, VSS, right groin intact. Upon arrival to neuro ICU, patient was found to have oozing of right groin incision. Manual pressure along with V-pad was held until hemostasis was achieved. Patient stayed in neuro ICU for overnight observation, no major complications occurred overnight.  This AM, patient awake and alert laying in bed with no complaints at this time. Plan to discharge home today and follow-up with Dr. Estanislado Pandy in clinic 2 weeks following discharge.   Discharge Exam: Blood pressure 101/69, pulse 63, temperature 97.8 F (36.6 C), temperature source Oral, resp. rate 16, height 5\' 11"  (1.803 m), weight 148 lb (67.1 kg), SpO2 100 %. Physical Exam  Constitutional: He appears well-developed and well-nourished. No distress.  Cardiovascular: Normal rate, regular rhythm and normal heart sounds.  No murmur heard. Pulmonary/Chest: Effort normal and breath sounds normal. No respiratory distress. He has no wheezes.  Neurological:  Alert, awake, and oriented x3. Speech and comprehension intact. PERRL bilaterally. EOMs intact bilaterally without nystagmus or subjective diplopia. Visual fields not assessed. No facial asymmetry. Tongue midline. Motor power symmetric proportional to  effort. No pronator drift. Fine motor and coordination intact and symmetric. Gait not assessed. Romberg not assessed. Heel to toe not assessed. Distal pulses 1+ bilaterally.  Skin: Skin is warm and dry.  Right groin incision soft without active bleeding or hematoma.  Psychiatric: He has a normal mood and affect. His behavior is normal. Judgment and thought content normal.  Nursing note and vitals reviewed.   Disposition: Discharge disposition: 01-Home or Self Care       Discharge Instructions    Call MD for:  difficulty breathing, headache or visual disturbances   Complete by:  As directed    Call MD for:  extreme fatigue   Complete by:  As directed    Call MD for:  hives   Complete by:  As directed    Call MD for:  persistant dizziness or light-headedness   Complete by:  As directed    Call MD for:  persistant nausea and vomiting   Complete by:  As directed    Call MD for:  redness, tenderness, or signs of infection (pain, swelling, redness, odor or green/yellow discharge around incision site)   Complete by:  As directed    Call MD for:  severe uncontrolled pain   Complete by:  As directed    Call MD for:  temperature >100.4   Complete by:  As directed    Diet - low sodium heart healthy   Complete by:  As directed    Discharge instructions   Complete by:  As directed    Continue taking Brilitna 90 mg twice daily. Continue taking Aspirin 81 mg once daily. No bending, stooping, or lifting more than 10 pounds for 2 weeks. No driving self for 2 weeks.   Increase activity slowly   Complete by:  As directed      Allergies as  of 03/22/2018      Reactions   Demerol [meperidine Hcl] Other (See Comments)   convulsions      Medication List    STOP taking these medications   aspirin 325 MG EC tablet Replaced by:  aspirin 81 MG tablet   clopidogrel 75 MG tablet Commonly known as:  PLAVIX     TAKE these medications   aspirin 81 MG tablet Take 1 tablet (81 mg  total) by mouth daily. Replaces:  aspirin 325 MG EC tablet   atorvastatin 80 MG tablet Commonly known as:  LIPITOR Take 1 tablet (80 mg total) by mouth daily at 6 PM.   ECHINACEA EXTRACT PO Take 760 mg by mouth daily.   FREESTYLE LIBRE 14 DAY SENSOR Misc 1 Device by Does not apply route every 14 (fourteen) days.   insulin glargine 100 unit/mL Sopn Commonly known as:  LANTUS Inject 0.1 mLs (10 Units total) into the skin at bedtime. What changed:    how much to take  when to take this   Insulin Pen Needle 32G X 4 MM Misc 1 Device by Does not apply route daily.   NP THYROID 30 MG tablet Generic drug:  thyroid Take 30 mg by mouth daily before breakfast.   sertraline 50 MG tablet Commonly known as:  ZOLOFT Take 50 mg by mouth every morning.   ticagrelor 90 MG Tabs tablet Commonly known as:  BRILINTA Take 90 mg by mouth 2 (two) times daily.   vitamin B-12 500 MCG tablet Commonly known as:  CYANOCOBALAMIN Take 500 mcg by mouth daily.      Follow-up Information    Luanne Bras, MD Follow up.   Specialties:  Interventional Radiology, Radiology Why:  Please follow-up with Dr. Estanislado Pandy in clinic 2 weeks after discharge. Contact information: [place] [place] [address] [phone]            Electronically Signed: Earley Abide, PA-C 03/22/2018, 10:08 AM   I have spent Less Than 30 Minutes discharging Mark Foster.

## 2018-03-22 NOTE — Discharge Instructions (Signed)

## 2018-03-22 NOTE — Progress Notes (Signed)
Discharge instructions given to patient. Patient verbalizes understanding. IV removed. Patient taken in wheelchair to car. Discharged with significant other.

## 2018-03-25 ENCOUNTER — Other Ambulatory Visit (HOSPITAL_COMMUNITY): Payer: Self-pay | Admitting: Interventional Radiology

## 2018-03-25 ENCOUNTER — Encounter (HOSPITAL_COMMUNITY): Payer: Self-pay | Admitting: Interventional Radiology

## 2018-03-25 DIAGNOSIS — I771 Stricture of artery: Secondary | ICD-10-CM

## 2018-03-26 ENCOUNTER — Encounter (HOSPITAL_COMMUNITY): Payer: Self-pay

## 2018-03-26 ENCOUNTER — Other Ambulatory Visit (HOSPITAL_COMMUNITY): Payer: Self-pay | Admitting: Interventional Radiology

## 2018-03-26 DIAGNOSIS — I771 Stricture of artery: Secondary | ICD-10-CM

## 2018-04-10 ENCOUNTER — Ambulatory Visit (HOSPITAL_COMMUNITY)
Admission: RE | Admit: 2018-04-10 | Discharge: 2018-04-10 | Disposition: A | Discharge status: Home / Self Care | Payer: BLUE CROSS/BLUE SHIELD | Source: Ambulatory Visit | Attending: Interventional Radiology | Admitting: Interventional Radiology

## 2018-04-10 DIAGNOSIS — I771 Stricture of artery: Secondary | ICD-10-CM

## 2018-04-10 NOTE — Consult Note (Signed)
Chief Complaint: Patient was seen in consultation today for left vertebral artery stenosis s/p revascularization.  Referring Physician(s): Garvin Fila  Supervising Physician: Luanne Bras  Patient Status: [facility] - Out-pt  History of Present Illness: Mark Foster is a 62 y.o. male with a past medical history of hypertension, CVA 01/2018 and 2011, diverticulitis, nephrolithiasis, hypothyroidism, diabetes mellitus type II, diabetic neuropathy, cervical compression fracture, cervical spinal stenosis. He is known to [facility] and has been followed by Dr. Estanislado Pandy since 01/2018. He underwent a diagnostic cerebral angiogram with Dr. Estanislado Pandy 01/21/2018.  Diagnostic cerebral angiogram 01/21/2018: 1. Approximately 65-70% stenosis of the right internal carotid artery and the caval cavernous segment with pre stenotic dilatation. 2. Approximately 50-70% stenosis of the proximal right middle cerebral artery. 3. Approximately 50% stenosis of the mid basilar artery.  4. 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 5. Severe stenosis of the proximal cavernous segment of the left internal carotid artery. 6. Severe 90% plus stenosis of the origin of the left vertebral artery.  He recently underwent a cerebral angiogram with revascularization of his left vertebral artery stenosis using stent assisted angioplasty with Dr. Estanislado Pandy 03/21/2018.  Patient presents today for follow-up regarding his recent procedure 03/21/2018. Patient awake and alert sitting in chair. States that he is "much better" since procedure. Complains of lightheadedness, stable at this time. Complains of occasional mild headaches. Complains of gait difficulty, stable at this time. Patient uses a cane with ambulation. Denies weakness, numbness/tingling, dizziness, syncope, vision  changes, hearing changes, tinnitus, or speech difficulty.  Patient is currently taking Brilinta 90 mg twice daily and Aspirin 81 mg once daily.   Past Medical History:  Diagnosis Date  . Cervical compression fracture ([place])   . Cervical spinal stenosis   . Diabetic neuropathy ([facility])    Bilateral legs  . Diverticulitis   . History of kidney stones   . Hypertension    No current medications  . Hypothyroidism   . Stenosis of left vertebral artery   . Stroke [facility])    2011  . Type 2 diabetes mellitus ([place])     Past Surgical History:  Procedure Laterality Date  . APPENDECTOMY    . COLON RESECTION     For diverticulitis  . COLON SURGERY    . IR ANGIO INTRA EXTRACRAN SEL COM CAROTID INNOMINATE BILAT MOD SED  01/21/2018  . IR ANGIO INTRA EXTRACRAN SEL COM CAROTID INNOMINATE UNI R MOD SED  03/21/2018  . IR ANGIO VERTEBRAL SEL VERTEBRAL BILAT MOD SED  01/21/2018  . IR TRANSCATH EXCRAN VERT OR CAR A STENT  03/21/2018  . RADIOLOGY WITH ANESTHESIA N/A 03/21/2018   Procedure: IR WITH ANESTHESIA WITH STENT PLACEMENT;  Surgeon: Luanne Bras, MD;  Location: [facility];  Service: Radiology;  Laterality: N/A;  . ROTATOR CUFF REPAIR     Left    Allergies: Demerol [meperidine hcl]  Medications: Prior to Admission medications   Medication Sig Start Date End Date Taking? Authorizing Provider  aspirin 81 MG tablet Take 1 tablet (81 mg total) by mouth daily. 03/22/18   Jonh Mcqueary, Bea Graff, PA-C  atorvastatin (LIPITOR) 80 MG tablet Take 1 tablet (80 mg total) by mouth daily at 6 PM. 01/22/18   Donzetta Starch, NP  Continuous Blood Gluc Sensor (FREESTYLE LIBRE 14 DAY SENSOR) MISC 1 Device by Does not apply route every 14 (fourteen) days. 01/22/18  Donzetta Starch, NP  ECHINACEA EXTRACT PO Take 760 mg by mouth daily.    [provider]  insulin glargine (LANTUS) 100 unit/mL SOPN Inject 0.1 mLs (10 Units total) into the skin at bedtime. Patient taking differently: Inject 20 Units into the skin every  morning.  01/22/18   Donzetta Starch, NP  Insulin Pen Needle 32G X 4 MM MISC 1 Device by Does not apply route daily. 01/22/18   Donzetta Starch, NP  NP THYROID 30 MG tablet Take 30 mg by mouth daily before breakfast.  01/17/18   [provider]  sertraline (ZOLOFT) 50 MG tablet Take 50 mg by mouth every morning.     [provider]  ticagrelor (BRILINTA) 90 MG TABS tablet Take 90 mg by mouth 2 (two) times daily.    [provider]  vitamin B-12 (CYANOCOBALAMIN) 500 MCG tablet Take 500 mcg by mouth daily.    [provider]     Family History  Problem Relation Age of Onset  . Stroke Mother     Social History   Socioeconomic History  . Marital status: Single    Spouse name: Not on file  . Number of children: Not on file  . Years of education: Not on file  . Highest education level: Not on file  Occupational History  . Not on file  Social Needs  . Financial resource strain: Not on file  . Food insecurity:    Worry: Not on file    Inability: Not on file  . Transportation needs:    Medical: Not on file    Non-medical: Not on file  Tobacco Use  . Smoking status: Never Smoker  . Smokeless tobacco: Never Used  Substance and Sexual Activity  . Alcohol use: Not Currently  . Drug use: Never  . Sexual activity: Not on file  Lifestyle  . Physical activity:    Days per week: Not on file    Minutes per session: Not on file  . Stress: Not on file  Relationships  . Social connections:    Talks on phone: Not on file    Gets together: Not on file    Attends religious service: Not on file    Active member of club or organization: Not on file    Attends meetings of clubs or organizations: Not on file    Relationship status: Not on file  Other Topics Concern  . Not on file  Social History Narrative  . Not on file     Review of Systems: A 12 point ROS discussed and pertinent positives are indicated in the HPI above.  All other systems are  negative.  Review of Systems  Constitutional: Negative for chills and fever.  HENT: Negative for hearing loss and tinnitus.   Respiratory: Negative for shortness of breath and wheezing.   Cardiovascular: Negative for chest pain and palpitations.  Musculoskeletal: Positive for gait problem.  Neurological: Positive for light-headedness and headaches. Negative for dizziness, syncope, speech difficulty, weakness and numbness.  Psychiatric/Behavioral: Negative for behavioral problems and confusion.    Vital Signs: There were no vitals taken for this visit.  Physical Exam  Constitutional: He is oriented to person, place, and time. He appears well-developed and well-nourished. No distress.  Pulmonary/Chest: Effort normal. No respiratory distress.  Neurological: He is alert and oriented to person, place, and time.  Skin: Skin is warm and dry.  Psychiatric: He has a normal mood and affect. His behavior is normal.  Judgment and thought content normal.  Nursing note and vitals reviewed.    Imaging: Ir Danielle Dess Or Car A Stent  Result Date: 03/25/2018 CLINICAL DATA:  Patient with history of vertebrobasilar ischemic symptoms secondary to severe vertebrobasilar disease. Known left vertebral artery origin stenosis. EXAM: INTRACRANIAL STENT (INCL PTA); IR ANGIO INTRA EXTRACRAN SEL COM CAROTID INNOMINATE UNI RIGHT MOD SED COMPARISON:  Diagnostic catheter arteriogram of 01/21/2018. MEDICATIONS: Heparin 3,500 units IV; Ancef 2 g IV antibiotic was administered within 1 hour of the procedure. ANESTHESIA/SEDATION: Mac anesthesia as per the Department of Anesthesiology at [facility]:  Isovue 300 approximately 100 cc. FLUOROSCOPY TIME:  Fluoroscopy Time: 23 minutes 12 seconds 806 mGy). COMPLICATIONS: None immediate. TECHNIQUE: Informed written consent was obtained from the patient after a thorough discussion of the procedural risks, benefits and alternatives. All questions were  addressed. Maximal Sterile Barrier Technique was utilized including caps, mask, sterile gowns, sterile gloves, sterile drape, hand hygiene and skin antiseptic. A timeout was performed prior to the initiation of the procedure. The right groin was prepped and draped in the usual sterile fashion. Thereafter using modified Seldinger technique, transfemoral access into the right common femoral artery was obtained without difficulty. Over a 0.035 inch guidewire, a 5 French Pinnacle sheath was inserted. Through this, and also over 0.035 inch guidewire, a 5 [place] JB 1 catheter was advanced to the aortic arch region and selectively positioned in the right common carotid artery, the innominate artery, and the left vertebral artery. FINDINGS: The innominate artery angiogram demonstrates the right vertebral artery origin to be mildly narrowed. The vessel is, otherwise, seen to opacify to the cranial skull base where it opacifies the right posterior-inferior cerebellar artery. The visualized portion of the right vertebrobasilar junction appears widely patent. The right common carotid arteriogram demonstrates the right external carotid artery and its major branches to be widely patent. The right internal carotid artery at the bulb to the cranial skull base is seen to opacify normally. The petrous portion is widely patent. There is approximately 50-70% stenosis of the petrous cavernous junction. The distal cavernous and the supraclinoid segments are widely patent. The right middle cerebral artery and the right anterior cerebral artery opacify into the capillary and venous phases. There is approximately 50-70% stenosis of the proximal right middle cerebral artery M1 segment. There is prompt cross filling via the anterior communicating artery of the left anterior cerebral artery and the left middle cerebral artery distributions. A right posterior communicating artery is seen opacifying the right posterior cerebral artery  distribution. The left subclavian arteriogram demonstrates again a 90+ stenosis of the left vertebral artery at its origin with post stenotic dilatation. The vessel is seen to opacify to the cranial skull base. Also demonstrated is an ascending cervical branch of the left thyrocervical trunk which appears to augment flow to the distal left vertebral artery just proximal to the origin of the left posterior-inferior cerebellar artery. The visualized left vertebrobasilar junction appears widely patent. ENDOVASCULAR REVASCULARIZATION OF SEVERE HIGH-GRADE SYMPTOMATIC STENOSIS OF THE LEFT VERTEBRAL ARTERY AT ITS ORIGIN. The diagnostic JB 1 catheter in the distal left subclavian artery distal to the origin of the left vertebral artery was then exchanged over a 0.035 inch 300 cm Rosen exchange guidewire for a 6 French 80 cm Cook shuttle sheath using biplane roadmap technique and constant fluoroscopic guidance. Good aspiration obtained from the hub of the [facility] shuttle sheath. A gentle contrast injection demonstrated no evidence of spasms, dissections or of intraluminal  filling defects. The exchange micro guidewire was then removed. The tip of the [facility] shuttle sheath was then retrieved until it was just proximal to the origin of the severely stenotic left vertebral artery origin. Over a 0.014 inch Softip Synchro micro guidewire, a SL 10 2 tip microcatheter was then advanced to the distal end of the [employer] sheath. Using a torque device and biplane roadmap technique and constant fluoroscopic guidance, access into the left vertebral artery was obtained through the high-grade stenosis and advanced to the distal left vertebral artery at the C2-C3 level. At this time, the micro guidewire was removed. Good aspiration was obtained from the hub of the SL 10 microcatheter. A gentle contrast injection demonstrated safe position of the tip of the microcatheter. This in turn was then exchanged for a 300 cm Transend Softip 0.014 inch  exchange micro guidewire. The micro guidewire tip had a J configuration to avoid dissections or inducing spasm. A control arteriogram performed following removal of the microcatheter demonstrated no change in the intracranial circulation. There was straightening of the mild tortuosity in the proximal 1/3 of the left vertebral artery. Measurements were then performed of the left vertebral artery just distal to its normal caliber proximally. It was decided to use a 3 mm x 12 mm Mult-ilink Vision bare metal stent. However, prior to this, because of the high-grade stenosis, an angioplasty would have to be undertaken. For this, a 2.25 mm x 15 mm Gateway angioplasty balloon guide catheter was prepped and purged with 50% contrast and 50% heparinized saline infusion. Thereafter using biplane roadmap technique and constant fluoroscopic guidance, over the exchange micro guidewire, the balloon was then advanced and positioned with the distal and proximal markers adequate distance from the site of the high-grade stenosis. Control inflation was then performed using a micro inflation syringe device via micro tubing to its normal diameter of 2.25 mm where it was maintained for approximately 45 seconds. The balloon was then deflated and retrieved and removed while maintaining the distal end of the exchange micro guidewire in the C2-C3 region of the left vertebral artery. A control arteriogram performed through the [facility] shuttle sheath demonstrated significant improved caliber and flow through the angioplastied segment. At this time, a 3 mm x 12 mm Multi-link Vision stent was retrogradely purged with heparinized saline infusion using the rapid exchange technique, this was then advanced over the exchange micro guidewire and its distal and proximal markers were then placed in positioned such that the proximal markers were just proximal to the occluded left vertebral artery. Thereafter, with the patient holding his breath, this was then  inflated with contrast using a micro inflation syringe device via micro tubing to a diameter of approximately 3.2 mm where it was maintained for approximately 15 seconds. The balloon of the stent was then deflated and retrieved and removed using the rapid exchange technique. A control arteriogram performed through the [facility] sheath demonstrated excellent apposition and caliber of the stented segment of the left vertebral artery with excellent flow to the left vertebrobasilar junction with now prompt opacification of the left vertebrobasilar junction, and with the flow noted into the basilar artery and the posterior and superior cerebellar arteries. Control arteriograms were then performed at 15 and 30 minutes post deployment of the stent. These continued to demonstrate excellent flow with apposition to the left vertebral artery in its stented segment. There was a less than 10% stenosis at the origin of the left vertebral artery secondary to plaque. Following the  final control arteriogram at around 40 minutes post deployment of the stent at the origin of the left vertebral artery, a control arteriogram performed continued to demonstrate excellent flow with wide patency. No evidence of intraluminal filling defects were seen extra cranially and intracranially. Also noted was improved hemodynamic flow into left vertebrobasilar junction and distally. Under constant fluoroscopic observation, the exchange micro guidewire was then retrieved and removed without any entanglement with the struts of the stent. The [facility] shuttle sheath was then retrieved into the abdominal aorta and exchanged over a J-tip guidewire for a 6 [place] Pinnacle sheath. This in turn was then removed with the successful application of a 6 [place] Exoseal closure device. Hemostasis was achieved. The right groin appeared soft without evidence of a hematoma or bleeding. The distal pulses remained palpable in the dorsalis pedis, and the posterior tibial  regions. At the end of the procedure, the patient remained clinically stable without any signs or symptoms of nausea, vomiting, visual symptoms or of motor, sensory or coordination difficulties. He was then transferred to the neuro ICU to continue on IV heparin. The patient's overnight stay was unremarkable. His right groin appeared soft. The following morning, the IV heparin was stopped and the patient was switched to Brilinta 90 mg b.i.d., and aspirin 81 mg a day. The patient was then ambulated without difficulty. The right groin appeared soft with no change in distal pulses. His p.o. intake was also normal. He was then discharged home under the care of his family with special instructions to maintain adequate hydration, continue on dual antiplatelets, and also to maintain tight control of his diabetes. He was also advised to refrain from stooping, bending or lifting weights above 10 pounds for 2 weeks. He was advised against driving for 2 weeks also. He will be seen in follow-up in the clinic in 2 weeks time. Should he develop any symptoms of stroke such as motor weakness, facial asymmetry, vision symptoms, speech problems, or motor or gait difficulties he was advised to call [phone]. The patient expressed understanding of the management plan. He was then discharged under the care of his family to home. IMPRESSION: Status post endovascular revascularization of severely stenotic left vertebral artery at its origin with stent assisted angioplasty. PLAN: Follow-up in the clinic in 2 weeks time. Electronically Signed   By: Luanne Bras M.D.   On: 03/22/2018 16:38   Ir Angio Intra Extracran Sel Com Carotid Innominate Uni R Mod Sed  Result Date: 03/25/2018 CLINICAL DATA:  Patient with history of vertebrobasilar ischemic symptoms secondary to severe vertebrobasilar disease. Known left vertebral artery origin stenosis. EXAM: INTRACRANIAL STENT (INCL PTA); IR ANGIO INTRA EXTRACRAN SEL COM CAROTID INNOMINATE UNI  RIGHT MOD SED COMPARISON:  Diagnostic catheter arteriogram of 01/21/2018. MEDICATIONS: Heparin 3,500 units IV; Ancef 2 g IV antibiotic was administered within 1 hour of the procedure. ANESTHESIA/SEDATION: Mac anesthesia as per the Department of Anesthesiology at [place]:  Isovue 300 approximately 100 cc. FLUOROSCOPY TIME:  Fluoroscopy Time: 23 minutes 12 seconds 806 mGy). COMPLICATIONS: None immediate. TECHNIQUE: Informed written consent was obtained from the patient after a thorough discussion of the procedural risks, benefits and alternatives. All questions were addressed. Maximal Sterile Barrier Technique was utilized including caps, mask, sterile gowns, sterile gloves, sterile drape, hand hygiene and skin antiseptic. A timeout was performed prior to the initiation of the procedure. The right groin was prepped and draped in the usual sterile fashion. Thereafter using modified Seldinger technique, transfemoral access into the  right common femoral artery was obtained without difficulty. Over a 0.035 inch guidewire, a 5 French Pinnacle sheath was inserted. Through this, and also over 0.035 inch guidewire, a 5 [place] JB 1 catheter was advanced to the aortic arch region and selectively positioned in the right common carotid artery, the innominate artery, and the left vertebral artery. FINDINGS: The innominate artery angiogram demonstrates the right vertebral artery origin to be mildly narrowed. The vessel is, otherwise, seen to opacify to the cranial skull base where it opacifies the right posterior-inferior cerebellar artery. The visualized portion of the right vertebrobasilar junction appears widely patent. The right common carotid arteriogram demonstrates the right external carotid artery and its major branches to be widely patent. The right internal carotid artery at the bulb to the cranial skull base is seen to opacify normally. The petrous portion is widely patent. There is approximately  50-70% stenosis of the petrous cavernous junction. The distal cavernous and the supraclinoid segments are widely patent. The right middle cerebral artery and the right anterior cerebral artery opacify into the capillary and venous phases. There is approximately 50-70% stenosis of the proximal right middle cerebral artery M1 segment. There is prompt cross filling via the anterior communicating artery of the left anterior cerebral artery and the left middle cerebral artery distributions. A right posterior communicating artery is seen opacifying the right posterior cerebral artery distribution. The left subclavian arteriogram demonstrates again a 90+ stenosis of the left vertebral artery at its origin with post stenotic dilatation. The vessel is seen to opacify to the cranial skull base. Also demonstrated is an ascending cervical branch of the left thyrocervical trunk which appears to augment flow to the distal left vertebral artery just proximal to the origin of the left posterior-inferior cerebellar artery. The visualized left vertebrobasilar junction appears widely patent. ENDOVASCULAR REVASCULARIZATION OF SEVERE HIGH-GRADE SYMPTOMATIC STENOSIS OF THE LEFT VERTEBRAL ARTERY AT ITS ORIGIN. The diagnostic JB 1 catheter in the distal left subclavian artery distal to the origin of the left vertebral artery was then exchanged over a 0.035 inch 300 cm Rosen exchange guidewire for a 6 French 80 cm Cook shuttle sheath using biplane roadmap technique and constant fluoroscopic guidance. Good aspiration obtained from the hub of the [facility] shuttle sheath. A gentle contrast injection demonstrated no evidence of spasms, dissections or of intraluminal filling defects. The exchange micro guidewire was then removed. The tip of the [facility] shuttle sheath was then retrieved until it was just proximal to the origin of the severely stenotic left vertebral artery origin. Over a 0.014 inch Softip Synchro micro guidewire, a SL 10 2 tip  microcatheter was then advanced to the distal end of the [employer] sheath. Using a torque device and biplane roadmap technique and constant fluoroscopic guidance, access into the left vertebral artery was obtained through the high-grade stenosis and advanced to the distal left vertebral artery at the C2-C3 level. At this time, the micro guidewire was removed. Good aspiration was obtained from the hub of the SL 10 microcatheter. A gentle contrast injection demonstrated safe position of the tip of the microcatheter. This in turn was then exchanged for a 300 cm Transend Softip 0.014 inch exchange micro guidewire. The micro guidewire tip had a J configuration to avoid dissections or inducing spasm. A control arteriogram performed following removal of the microcatheter demonstrated no change in the intracranial circulation. There was straightening of the mild tortuosity in the proximal 1/3 of the left vertebral artery. Measurements were then performed of the left  vertebral artery just distal to its normal caliber proximally. It was decided to use a 3 mm x 12 mm Mult-ilink Vision bare metal stent. However, prior to this, because of the high-grade stenosis, an angioplasty would have to be undertaken. For this, a 2.25 mm x 15 mm Gateway angioplasty balloon guide catheter was prepped and purged with 50% contrast and 50% heparinized saline infusion. Thereafter using biplane roadmap technique and constant fluoroscopic guidance, over the exchange micro guidewire, the balloon was then advanced and positioned with the distal and proximal markers adequate distance from the site of the high-grade stenosis. Control inflation was then performed using a micro inflation syringe device via micro tubing to its normal diameter of 2.25 mm where it was maintained for approximately 45 seconds. The balloon was then deflated and retrieved and removed while maintaining the distal end of the exchange micro guidewire in the C2-C3 region of the  left vertebral artery. A control arteriogram performed through the [facility] shuttle sheath demonstrated significant improved caliber and flow through the angioplastied segment. At this time, a 3 mm x 12 mm Multi-link Vision stent was retrogradely purged with heparinized saline infusion using the rapid exchange technique, this was then advanced over the exchange micro guidewire and its distal and proximal markers were then placed in positioned such that the proximal markers were just proximal to the occluded left vertebral artery. Thereafter, with the patient holding his breath, this was then inflated with contrast using a micro inflation syringe device via micro tubing to a diameter of approximately 3.2 mm where it was maintained for approximately 15 seconds. The balloon of the stent was then deflated and retrieved and removed using the rapid exchange technique. A control arteriogram performed through the [facility] sheath demonstrated excellent apposition and caliber of the stented segment of the left vertebral artery with excellent flow to the left vertebrobasilar junction with now prompt opacification of the left vertebrobasilar junction, and with the flow noted into the basilar artery and the posterior and superior cerebellar arteries. Control arteriograms were then performed at 15 and 30 minutes post deployment of the stent. These continued to demonstrate excellent flow with apposition to the left vertebral artery in its stented segment. There was a less than 10% stenosis at the origin of the left vertebral artery secondary to plaque. Following the final control arteriogram at around 40 minutes post deployment of the stent at the origin of the left vertebral artery, a control arteriogram performed continued to demonstrate excellent flow with wide patency. No evidence of intraluminal filling defects were seen extra cranially and intracranially. Also noted was improved hemodynamic flow into left vertebrobasilar  junction and distally. Under constant fluoroscopic observation, the exchange micro guidewire was then retrieved and removed without any entanglement with the struts of the stent. The [facility] shuttle sheath was then retrieved into the abdominal aorta and exchanged over a J-tip guidewire for a 6 [place] Pinnacle sheath. This in turn was then removed with the successful application of a 6 [place] Exoseal closure device. Hemostasis was achieved. The right groin appeared soft without evidence of a hematoma or bleeding. The distal pulses remained palpable in the dorsalis pedis, and the posterior tibial regions. At the end of the procedure, the patient remained clinically stable without any signs or symptoms of nausea, vomiting, visual symptoms or of motor, sensory or coordination difficulties. He was then transferred to the neuro ICU to continue on IV heparin. The patient's overnight stay was unremarkable. His right groin appeared soft. The  following morning, the IV heparin was stopped and the patient was switched to Brilinta 90 mg b.i.d., and aspirin 81 mg a day. The patient was then ambulated without difficulty. The right groin appeared soft with no change in distal pulses. His p.o. intake was also normal. He was then discharged home under the care of his family with special instructions to maintain adequate hydration, continue on dual antiplatelets, and also to maintain tight control of his diabetes. He was also advised to refrain from stooping, bending or lifting weights above 10 pounds for 2 weeks. He was advised against driving for 2 weeks also. He will be seen in follow-up in the clinic in 2 weeks time. Should he develop any symptoms of stroke such as motor weakness, facial asymmetry, vision symptoms, speech problems, or motor or gait difficulties he was advised to call 911. The patient expressed understanding of the management plan. He was then discharged under the care of his family to home. IMPRESSION: Status post  endovascular revascularization of severely stenotic left vertebral artery at its origin with stent assisted angioplasty. PLAN: Follow-up in the clinic in 2 weeks time. Electronically Signed   By: Luanne Bras M.D.   On: 03/22/2018 16:38    Labs:  CBC: Recent Labs    01/21/18 0254 01/22/18 0335 03/19/18 1208 03/22/18 0421  WBC 2.7* 2.6* 2.0* 2.0*  HGB 13.1 13.1 14.0 11.5*  HCT 37.4* 37.0* 39.9 33.5*  PLT 89* 91* 103* 80*    COAGS: Recent Labs    01/19/18 1802 01/21/18 0254 03/19/18 1208 03/21/18 1530  INR 1.08 1.14 1.08  --   APTT 27  --  27 50*    BMP: Recent Labs    01/21/18 0254 01/22/18 0335 03/19/18 1208 03/22/18 0421  NA 140 141 141 141  K 4.0 3.5 3.8 3.4*  CL 107 108 107 109  CO2 _0 GLUCOSE 244* 154* 167* 130*  BUN _1 CALCIUM 8.5* 8.6* 9.4 8.4*  CREATININE 0.68 0.62 0.60* 0.61  GFRNONAA >60 >60 >60 >60  GFRAA >60 >60 >60 >60    LIVER FUNCTION TESTS: Recent Labs    01/19/18 1802  BILITOT 1.4*  AST 17  ALT 17  ALKPHOS 49  PROT 7.0  ALBUMIN 4.1    TUMOR MARKERS: No results for input(s): AFPTM, CEA, CA199, CHROMGRNA in the last 8760 hours.  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 Estanislado Pandy using stent assisted angioplasty. Reviewed imaging with patient. Explained that the best course of management at this time for his left vertebral artery stenosis, along with other areas of intracranial stenosis listed in HPI, is with routine imaging scans.  Discussed patient's diabetes mellitus control with patient. Recommended diet adjustments to limit carbohydrate intake to keep blood sugars down. Instructed patient to continue following up with his endocrinologist and nutritionist regularly, and to continue monitoring blood sugars daily.  Plan for follow-up with CTA head (with contrast) in 4 months from procedure 03/21/2018. Informed patient that our schedulers will call him to  set up this imaging scan. Instructed patient to continue taking Brilinta 90 mg twice daily and Aspirin 81 mg once daily.  All questions answered and concerns addressed. Patient conveys understanding and agrees with plan.  Thank you for this interesting consult.  I greatly enjoyed meeting Melody Savidge and look forward to participating in their care.  A copy of this report was sent to the requesting provider on this date.  Electronically Signed: Earley Abide, PA-C 04/10/2018, 7:45 AM   I spent a total of 25 Minutes in face to face in clinical consultation, greater than 50% of which was counseling/coordinating care for left vertebral artery stenosis s/p revascularization.

## 2018-05-01 ENCOUNTER — Other Ambulatory Visit: Payer: Self-pay

## 2018-05-01 NOTE — Patient Outreach (Signed)
Telephone outreach to patient to obtain mRS was successfully completed. mRS = 1 

## 2018-05-07 ENCOUNTER — Encounter: Payer: Self-pay | Admitting: Nutrition

## 2018-05-07 ENCOUNTER — Telehealth: Payer: Self-pay | Admitting: Nutrition

## 2018-05-07 ENCOUNTER — Encounter: Payer: BLUE CROSS/BLUE SHIELD | Attending: "Endocrinology | Admitting: Nutrition

## 2018-05-07 VITALS — Ht 71.0 in | Wt 154.0 lb

## 2018-05-07 DIAGNOSIS — Z713 Dietary counseling and surveillance: Secondary | ICD-10-CM | POA: Insufficient documentation

## 2018-05-07 DIAGNOSIS — Z6821 Body mass index (BMI) 21.0-21.9, adult: Secondary | ICD-10-CM | POA: Insufficient documentation

## 2018-05-07 DIAGNOSIS — E1165 Type 2 diabetes mellitus with hyperglycemia: Secondary | ICD-10-CM | POA: Diagnosis not present

## 2018-05-07 DIAGNOSIS — IMO0002 Reserved for concepts with insufficient information to code with codable children: Secondary | ICD-10-CM

## 2018-05-07 DIAGNOSIS — E118 Type 2 diabetes mellitus with unspecified complications: Secondary | ICD-10-CM

## 2018-05-07 NOTE — Progress Notes (Signed)
Medical Nutrition Therapy:  Appt start time: 0800 end time:  0930.   Assessment:  Primary concerns today: DM Type 2 but being evaluated as Type 1 by DR. Nida, Endocrinologist.. Lives  By himself. Had a CVA a few weeks ago. Walks with a cane. Unsteady gait at times. . Able to drive and care for himself.He is retired.   PCP Dr. Octavio Manns. Eats 3 meals per day. Healthy eater. Has cut out fried foods. Takes 20 units of Lantus a day. Has a [place]. Had 6 low blood sugars in the last month; typicaly between 10 pm and 8 am. Takes Lantus in am so he doesn't forget taking it.   Current diet is insuffient in CHO. He notes he was trying to avoid carbs to Improve his blood sugar. A1C 4/19 was 12.7%, A1C July 2019 was 6.5%. Lipid profile April was high.Labs from [name] office: QPRF163 mg/dl, HDL 47 mg/dl, TG 184 mg/dl and LDL 136 mg/dl. CHOL/HDL ratio 4.3. 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 To get labs done for Dr. Dorris Fetch soon. Next appt is 05-14-18. Engaged to eat CHO more consistently with all meals and prevent low blood sugars.  Lab Results  Component Value Date   HGBA1C 6.5 (H) 03/19/2018   CMP Latest Ref Rng & Units 03/22/2018 03/19/2018 01/22/2018  Glucose 70 - 99 mg/dL 130(H) 167(H) 154(H)  BUN 8 - 23 mg/dL 14 20 9   Creatinine 0.61 - 1.24 mg/dL 0.61 0.60(L) 0.62  Sodium 135 - 145 mmol/L 141 141 141  Potassium 3.5 - 5.1 mmol/L 3.4(L) 3.8 3.5  Chloride 98 - 111 mmol/L 109 107 108  CO2 22 - 32 mmol/L 24 27 25   Calcium 8.9 - 10.3 mg/dL 8.4(L) 9.4 8.6(L)  Total Protein 6.5 - 8.1 g/dL - - -  Total Bilirubin 0.3 - 1.2 mg/dL - - -  Alkaline Phos 38 - 126 U/L - - -  AST 15 - 41 U/L - - -  ALT 17 - 63 U/L - - -     Preferred Learning Style:    No preference indicated   Learning Readiness:  Ready  Change in progress   MEDICATIONS: see list   DIETARY INTAKE:  B) eggs and sausage,,water L) meat and vegetables, water D) Chicken,  potato salad and asparagus, water   Usual physical activity: ADL  Estimated energy needs: 1800  calories 200  g carbohydrates 135 g protein 50 g fat  Progress Towards Goal(s):  In progress.   Nutritional Diagnosis:  NB-1.1 Food and nutrition-related knowledge deficit As related to Diabetes  As evidenced by A1C 6.5%.    Intervention: Nutrition and Diabetes education provided on My Plate, CHO counting, meal planning, portion sizes, timing of meals, avoiding snacks between meals unless having a low blood sugar, target ranges for A1C and blood sugars, signs/symptoms and treatment of hyper/hypoglycemia, monitoring blood sugars, taking medications as prescribed, benefits of exercising 30 minutes per day and prevention of complications of DM. Goals 1. Follow My Plate 2. Eat 3 carb choice per meal 3. Keep drinking water 4. Avoid low blood sugars. Call if BS lower than 70 2-3 times in a row. .  Teaching Method Utilized:  Visual Auditory Hands on  Handouts given during visit include:  The Plate Method   Meal Plan Card   Barriers to learning/adherence to lifestyle change: none  Demonstrated degree of understanding via:  Teach Back   Monitoring/Evaluation:  Dietary intake, exercise, meal planning , and body weight in 1 month(s).

## 2018-05-07 NOTE — Telephone Encounter (Signed)
TC call to [name] office to get most recent labs.  Hasn't been 90 days for A1C levels. Talked to Dr. Dorris Fetch and he advised for patient to wait til after next visit to get his labs done for Dr. Dorris Fetch to meet the 90 day requirements. Pt. Verbalized understanding. [name] office will email Lipid profiile, Cmet and other labs done in April 2019 and August 2019. Will provide results to Dr. Dorris Fetch.

## 2018-05-07 NOTE — Patient Instructions (Signed)
Goals 1. Follow My Plate 2. Eat 3 carb choice per meal 3. Keep drinking water 4. Avoid low blood sugars. Call if BS lower than 70 2-3 times in a row.

## 2018-05-14 ENCOUNTER — Encounter: Payer: BLUE CROSS/BLUE SHIELD | Attending: "Endocrinology | Admitting: Nutrition

## 2018-05-14 ENCOUNTER — Ambulatory Visit: Payer: BLUE CROSS/BLUE SHIELD | Admitting: "Endocrinology

## 2018-05-14 ENCOUNTER — Encounter: Payer: Self-pay | Admitting: Nutrition

## 2018-05-14 ENCOUNTER — Ambulatory Visit: Payer: BLUE CROSS/BLUE SHIELD | Admitting: Nutrition

## 2018-05-14 DIAGNOSIS — E1165 Type 2 diabetes mellitus with hyperglycemia: Secondary | ICD-10-CM | POA: Insufficient documentation

## 2018-05-14 DIAGNOSIS — IMO0002 Reserved for concepts with insufficient information to code with codable children: Secondary | ICD-10-CM

## 2018-05-14 DIAGNOSIS — Z713 Dietary counseling and surveillance: Secondary | ICD-10-CM | POA: Diagnosis not present

## 2018-05-14 DIAGNOSIS — E118 Type 2 diabetes mellitus with unspecified complications: Secondary | ICD-10-CM

## 2018-05-14 NOTE — Progress Notes (Signed)
  Medical Nutrition Therapy:  Appt start time: 930nd time:  1000  Assessment:  Primary concerns today: DM Type 2 but being evaluated as Type 1 by DR. Nida,  Changes made: Still working on balancing BS better. Only had 1 low BS.  Suppose to see Dr. Dorris Fetch with labs but he hasn't done lab work yet. Will reschedule appt with him. Lantus 20 units a day. Is frustrated with being told he can have carbs from potatoes and complex carbs when his whole life he has been told to avoid them. Tried to reassure him importance of good carbs needed with meals. He is eating much healthier foods and has cut out red meat and high fat foods. Lost 6 lbs. His diet is insuffient in calories and carbs contributing to weight loss. He is waiting on hematologist appt to find out why his blood counts are low.  Lab Results  Component Value Date   HGBA1C 6.5 (H) 03/19/2018   CMP Latest Ref Rng & Units 03/22/2018 03/19/2018 01/22/2018  Glucose 70 - 99 mg/dL 130(H) 167(H) 154(H)  BUN 8 - 23 mg/dL 14 20 9   Creatinine 0.61 - 1.24 mg/dL 0.61 0.60(L) 0.62  Sodium 135 - 145 mmol/L 141 141 141  Potassium 3.5 - 5.1 mmol/L 3.4(L) 3.8 3.5  Chloride 98 - 111 mmol/L 109 107 108  CO2 22 - 32 mmol/L 24 27 25   Calcium 8.9 - 10.3 mg/dL 8.4(L) 9.4 8.6(L)  Total Protein 6.5 - 8.1 g/dL - - -  Total Bilirubin 0.3 - 1.2 mg/dL - - -  Alkaline Phos 38 - 126 U/L - - -  AST 15 - 41 U/L - - -  ALT 17 - 63 U/L - - -     Preferred Learning Style:    No preference indicated   Learning Readiness:  Ready  Change in progress   MEDICATIONS: see list   DIETARY INTAKE:  B) eggs and sausage,,water 1 slice toast. L)  Cheken, vegetabes-broccoli, cauliflower, water D) Same as lunch.   Usual physical activity: ADL  Estimated energy needs: 1800  calories 200  g carbohydrates 135 g protein 50 g fat  Progress Towards Goal(s):  In progress.   Nutritional Diagnosis:  NB-1.1 Food and nutrition-related knowledge deficit As related to  Diabetes  As evidenced by A1C 6.5%.    Intervention: Nutrition and Diabetes education provided on My Plate, CHO counting, meal planning, portion sizes, timing of meals, avoiding snacks between meals unless having a low blood sugar, target ranges for A1C and blood sugars, signs/symptoms and treatment of hyper/hypoglycemia, monitoring blood sugars, taking medications as prescribed, benefits of exercising 30 minutes per day and prevention of complications of DM.  Marland KitchenGoals Try to get 45 grams of carbs per meals Add nuts and healthy fats to meals for increased calories.   Teaching Method Utilized:  Visual Auditory Hands on  Handouts given during visit include:  The Plate Method   Meal Plan Card   Barriers to learning/adherence to lifestyle change: none  Demonstrated degree of understanding via:  Teach Back   Monitoring/Evaluation:  Dietary intake, exercise, meal planning , and body weight in 1 month(s).

## 2018-05-14 NOTE — Patient Instructions (Addendum)
Goals Try to get 45 grams of carbs per meals Add nuts and healthy fats to meals for increased calories.

## 2018-05-20 ENCOUNTER — Other Ambulatory Visit: Payer: Self-pay | Admitting: "Endocrinology

## 2018-05-20 ENCOUNTER — Other Ambulatory Visit (HOSPITAL_COMMUNITY)
Admission: RE | Admit: 2018-05-20 | Discharge: 2018-05-20 | Disposition: A | Discharge status: Home / Self Care | Payer: BLUE CROSS/BLUE SHIELD | Source: Ambulatory Visit | Attending: "Endocrinology | Admitting: "Endocrinology

## 2018-05-20 DIAGNOSIS — E1165 Type 2 diabetes mellitus with hyperglycemia: Secondary | ICD-10-CM | POA: Diagnosis present

## 2018-05-20 LAB — COMPREHENSIVE METABOLIC PANEL
ALT: 23 U/L (ref 0–44)
AST: 14 U/L — ABNORMAL LOW (ref 15–41)
Albumin: 4.3 g/dL (ref 3.5–5.0)
Alkaline Phosphatase: 56 U/L (ref 38–126)
Anion gap: 7 (ref 5–15)
BILIRUBIN TOTAL: 1 mg/dL (ref 0.3–1.2)
BUN: 18 mg/dL (ref 8–23)
CO2: 27 mmol/L (ref 22–32)
CREATININE: 0.63 mg/dL (ref 0.61–1.24)
Calcium: 9.2 mg/dL (ref 8.9–10.3)
Chloride: 108 mmol/L (ref 98–111)
Glucose, Bld: 67 mg/dL — ABNORMAL LOW (ref 70–99)
POTASSIUM: 3.8 mmol/L (ref 3.5–5.1)
Sodium: 142 mmol/L (ref 135–145)
TOTAL PROTEIN: 7.2 g/dL (ref 6.5–8.1)

## 2018-05-20 LAB — TSH: TSH: 0.275 u[IU]/mL — ABNORMAL LOW (ref 0.350–4.500)

## 2018-05-20 LAB — VITAMIN B12: Vitamin B-12: 1514 pg/mL — ABNORMAL HIGH (ref 180–914)

## 2018-05-20 LAB — T4, FREE: FREE T4: 0.83 ng/dL (ref 0.82–1.77)

## 2018-05-21 LAB — ANTI-ISLET CELL ANTIBODY: Pancreatic Islet Cell Antibody: NEGATIVE

## 2018-05-21 LAB — MICROALBUMIN / CREATININE URINE RATIO
Creatinine, Urine: 137 mg/dL
MICROALB/CREAT RATIO: 5.3 mg/g{creat} (ref 0.0–30.0)
Microalb, Ur: 7.2 ug/mL — ABNORMAL HIGH

## 2018-05-21 LAB — GLUTAMIC ACID DECARBOXYLASE AUTO ABS

## 2018-05-22 LAB — HEMOGLOBIN A1C
HEMOGLOBIN A1C: 6.2 % — AB (ref 4.8–5.6)
MEAN PLASMA GLUCOSE: 131 mg/dL

## 2018-05-28 ENCOUNTER — Telehealth: Payer: Self-pay | Admitting: Adult Health

## 2018-05-28 ENCOUNTER — Encounter: Payer: Self-pay | Admitting: Adult Health

## 2018-05-28 ENCOUNTER — Ambulatory Visit: Payer: BLUE CROSS/BLUE SHIELD | Admitting: Adult Health

## 2018-05-28 VITALS — BP 95/63 | HR 82 | Ht 71.0 in | Wt 152.0 lb

## 2018-05-28 DIAGNOSIS — R29818 Other symptoms and signs involving the nervous system: Secondary | ICD-10-CM | POA: Diagnosis not present

## 2018-05-28 DIAGNOSIS — E785 Hyperlipidemia, unspecified: Secondary | ICD-10-CM | POA: Diagnosis not present

## 2018-05-28 DIAGNOSIS — I639 Cerebral infarction, unspecified: Secondary | ICD-10-CM

## 2018-05-28 DIAGNOSIS — E119 Type 2 diabetes mellitus without complications: Secondary | ICD-10-CM

## 2018-05-28 DIAGNOSIS — R2990 Unspecified symptoms and signs involving the nervous system: Secondary | ICD-10-CM

## 2018-05-28 DIAGNOSIS — I10 Essential (primary) hypertension: Secondary | ICD-10-CM

## 2018-05-28 MED ORDER — GABAPENTIN 300 MG PO CAPS
300.0000 mg | ORAL_CAPSULE | Freq: Every day | ORAL | 3 refills | Status: DC
Start: 2018-05-28 — End: 2018-08-29

## 2018-05-28 NOTE — Telephone Encounter (Signed)
[employer] Auth: [id number] (exp. 05/28/18 to 06/26/18) order sent to [facility]. They will reach out to the pt to schedule.
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
## 2018-05-28 NOTE — Patient Instructions (Signed)
Continue aspirin 81 mg daily and brilinta  and lipitor  for secondary stroke prevention  Follow up with Dr. Estanislado Pandy in November for repeat imaging  Start gabapentin 300mg  at night for bilateral lower extremity nerve pain   You will be called to schedule CT scan of your head due to left sided weakness which has started approx 1 month ago  Continue to follow up with PCP regarding cholesterol and blood pressremanagement   Follow up with endocrinology for diabetes management  Continue to stay active as tolerated  Continue to monitor blood pressure at home  Maintain strict control of hypertension with blood pressure goal below 130/90, diabetes with hemoglobin A1c goal below 6.5% and cholesterol with LDL cholesterol (bad cholesterol) goal below 70 mg/dL. I also advised the patient to eat a healthy diet with plenty of whole grains, cereals, fruits and vegetables, exercise regularly and maintain ideal body weight.  Followup in the future with me in 3 months or call earlier if needed       Thank you for coming to see [place] at [facility]. I hope we have been able to provide you high quality care today.  You may receive a patient satisfaction survey over the next few weeks. We would appreciate your feedback and comments so that we may continue to improve ourselves and the health of our patients.   Gabapentin capsules or tablets What is this medicine? GABAPENTIN (GA ba pen tin) is used to control partial seizures in adults with epilepsy. It is also used to treat certain types of nerve pain. This medicine may be used for other purposes; ask your health care provider or pharmacist if you have questions. COMMON BRAND NAME(S): Active-PAC with Gabapentin, Gabarone, Neurontin What should I tell my health care provider before I take this medicine? They need to know if you have any of these conditions: -kidney disease -suicidal thoughts, plans, or attempt; a previous suicide  attempt by you or a family member -an unusual or allergic reaction to gabapentin, other medicines, foods, dyes, or preservatives -pregnant or trying to get pregnant -breast-feeding How should I use this medicine? Take this medicine by mouth with a glass of water. Follow the directions on the prescription label. You can take it with or without food. If it upsets your stomach, take it with food.Take your medicine at regular intervals. Do not take it more often than directed. Do not stop taking except on your doctor's advice. If you are directed to break the 600 or 800 mg tablets in half as part of your dose, the extra half tablet should be used for the next dose. If you have not used the extra half tablet within 28 days, it should be thrown away. A special MedGuide will be given to you by the pharmacist with each prescription and refill. Be sure to read this information carefully each time. Talk to your pediatrician regarding the use of this medicine in children. Special care may be needed. Overdosage: If you think you have taken too much of this medicine contact a poison control center or emergency room at once. NOTE: This medicine is only for you. Do not share this medicine with others. What if I miss a dose? If you miss a dose, take it as soon as you can. If it is almost time for your next dose, take only that dose. Do not take double or extra doses. What may interact with this medicine? Do not take this medicine with any of the following  medications: -other gabapentin products This medicine may also interact with the following medications: -alcohol -antacids -antihistamines for allergy, cough and cold -certain medicines for anxiety or sleep -certain medicines for depression or psychotic disturbances -homatropine; hydrocodone -naproxen -narcotic medicines (opiates) for pain -phenothiazines like chlorpromazine, mesoridazine, prochlorperazine, thioridazine This list may not describe all  possible interactions. Give your health care provider a list of all the medicines, herbs, non-prescription drugs, or dietary supplements you use. Also tell them if you smoke, drink alcohol, or use illegal drugs. Some items may interact with your medicine. What should I watch for while using this medicine? Visit your doctor or health care professional for regular checks on your progress. You may want to keep a record at home of how you feel your condition is responding to treatment. You may want to share this information with your doctor or health care professional at each visit. You should contact your doctor or health care professional if your seizures get worse or if you have any new types of seizures. Do not stop taking this medicine or any of your seizure medicines unless instructed by your doctor or health care professional. Stopping your medicine suddenly can increase your seizures or their severity. Wear a medical identification bracelet or chain if you are taking this medicine for seizures, and carry a card that lists all your medications. You may get drowsy, dizzy, or have blurred vision. Do not drive, use machinery, or do anything that needs mental alertness until you know how this medicine affects you. To reduce dizzy or fainting spells, do not sit or stand up quickly, especially if you are an older patient. Alcohol can increase drowsiness and dizziness. Avoid alcoholic drinks. Your mouth may get dry. Chewing sugarless gum or sucking hard candy, and drinking plenty of water will help. The use of this medicine may increase the chance of suicidal thoughts or actions. Pay special attention to how you are responding while on this medicine. Any worsening of mood, or thoughts of suicide or dying should be reported to your health care professional right away. Women who become pregnant while using this medicine may enroll in the [place] Pregnancy Registry by calling  [phone]. This registry collects information about the safety of antiepileptic drug use during pregnancy. What side effects may I notice from receiving this medicine? Side effects that you should report to your doctor or health care professional as soon as possible: -allergic reactions like skin rash, itching or hives, swelling of the face, lips, or tongue -worsening of mood, thoughts or actions of suicide or dying Side effects that usually do not require medical attention (report to your doctor or health care professional if they continue or are bothersome): -constipation -difficulty walking or controlling muscle movements -dizziness -nausea -slurred speech -tiredness -tremors -weight gain This list may not describe all possible side effects. Call your doctor for medical advice about side effects. You may report side effects to FDA at 1-800-FDA-1088. Where should I keep my medicine? Keep out of reach of children. This medicine may cause accidental overdose and death if it taken by other adults, children, or pets. Mix any unused medicine with a substance like cat litter or coffee grounds. Then throw the medicine away in a sealed container like a sealed bag or a coffee can with a lid. Do not use the medicine after the expiration date. Store at room temperature between 15 and 30 degrees C (59 and 86 degrees F). NOTE: This sheet is a summary. It  may not cover all possible information. If you have questions about this medicine, talk to your doctor, pharmacist, or health care provider.  2018 Elsevier/Gold Standard (2013-10-17 15:26:50)

## 2018-05-29 ENCOUNTER — Other Ambulatory Visit (HOSPITAL_COMMUNITY): Payer: Self-pay | Admitting: Interventional Radiology

## 2018-05-31 NOTE — Progress Notes (Signed)
I agree with the above plan 

## 2018-06-03 ENCOUNTER — Encounter: Payer: Self-pay | Admitting: Nutrition

## 2018-06-03 ENCOUNTER — Ambulatory Visit
Admission: RE | Admit: 2018-06-03 | Discharge: 2018-06-03 | Disposition: A | Discharge status: Home / Self Care | Payer: BLUE CROSS/BLUE SHIELD | Source: Ambulatory Visit | Attending: Adult Health | Admitting: Adult Health

## 2018-06-03 DIAGNOSIS — R29818 Other symptoms and signs involving the nervous system: Secondary | ICD-10-CM

## 2018-06-03 NOTE — Patient Instructions (Signed)
Goals 1. Keep eating 3 meals per day 2. Increase fresh fruits and vegetables. Keep drinking Avoid processed foods Don't skip meals. Avoid low blood sugars.

## 2018-06-03 NOTE — Progress Notes (Signed)
Medical Nutrition Therapy:  Appt start time: 930nd time:  1000  Assessment:  Primary concerns today: DM Type 2 but being evaluated as Type 1 by DR. Nida,  Changes made: Still working on balancing BS better. Only had 1 low BS.  Suppose to see Dr. Dorris Fetch with labs but he hasn't done lab work yet. Will reschedule appt with him. Lantus 20 units a day. Is frustrated with being told he can have carbs from potatoes and complex carbs when his whole life he has been told to avoid them. Tried to reassure him importance of good carbs needed with meals. He is eating much healthier foods and has cut out red meat and high fat foods. Lost 6 lbs. His diet is insuffient in calories and carbs contributing to weight loss. He is waiting on hematologist appt to find out why his blood counts are low. Appears with flat affect today. Has a lot of things going on that he needs to figure out what's going with low blood count.  Vitals with BMI 05/14/2018  Height   Weight 147 lbs  BMI 72.62  Systolic   Diastolic   Pulse   Respirations    Results for Mark Foster, Mark Foster (MRN [id number]) as of 06/03/2018 12:37  Ref. Range 03/22/2018 38:45  BASIC METABOLIC PANEL Unknown Rpt (A)  Sodium Latest Ref Range: 135 - 145 mmol/L 141  Potassium Latest Ref Range: 3.5 - 5.1 mmol/L 3.4 (L)  Chloride Latest Ref Range: 98 - 111 mmol/L 109  CO2 Latest Ref Range: 22 - 32 mmol/L 24  Glucose Latest Ref Range: 70 - 99 mg/dL 130 (H)  BUN Latest Ref Range: 8 - 23 mg/dL 14  Creatinine Latest Ref Range: 0.61 - 1.24 mg/dL 0.61  Calcium Latest Ref Range: 8.9 - 10.3 mg/dL 8.4 (L)  Anion gap Latest Ref Range: 5 - 15  8  GFR, Est Non African American Latest Ref Range: >60 mL/min >60  GFR, Est African American Latest Ref Range: >60 mL/min >60  WBC Latest Ref Range: 4.0 - 10.5 K/uL 2.0 (L)  RBC Latest Ref Range: 4.22 - 5.81 MIL/uL 3.60 (L)  Hemoglobin Latest Ref Range: 13.0 - 17.0 g/dL 11.5 (L)  HCT Latest Ref Range: 39.0 - 52.0 % 33.5 (L)  MCV  Latest Ref Range: 78.0 - 100.0 fL 93.1  MCH Latest Ref Range: 26.0 - 34.0 pg 31.9  MCHC Latest Ref Range: 30.0 - 36.0 g/dL 34.3  RDW Latest Ref Range: 11.5 - 15.5 % 13.6  Platelets Latest Ref Range: 150 - 400 K/uL 80 (L)  Neutrophils Latest Units: % 37  Lymphocytes Latest Units: % 54  Monocytes Relative Latest Units: % 6  Eosinophil Latest Units: % 2  Basophil Latest Units: % 1  NEUT# Latest Ref Range: 1.7 - 7.7 K/uL 0.7 (L)  Lymphocyte # Latest Ref Range: 0.7 - 4.0 K/uL 1.2  Monocyte # Latest Ref Range: 0.1 - 1.0 K/uL 0.1  Eosinophils Absolute Latest Ref Range: 0.0 - 0.7 K/uL 0.0  Basophils Absolute Latest Ref Range: 0.0 - 0.1 K/uL 0.0  Smear Review Unknown PLATELETS APPEAR DECREASED    Preferred Learning Style:    No preference indicated   Learning Readiness:  Ready  Change in progress   MEDICATIONS: see list   DIETARY INTAKE:  B) eggs and sausage,,water 1 slice toast. L)  Cheken, vegetabes-broccoli, cauliflower, water D) Same as lunch.   Usual physical activity: ADL  Estimated energy needs: 1800  calories 200  g carbohydrates 135 g protein  50 g fat  Progress Towards Goal(s):  In progress.   Nutritional Diagnosis:  NB-1.1 Food and nutrition-related knowledge deficit As related to Diabetes  As evidenced by A1C 6.5%.    Intervention: Nutrition and Diabetes education provided on My Plate, CHO counting, meal planning, portion sizes, timing of meals, avoiding snacks between meals unless having a low blood sugar, target ranges for A1C and blood sugars, signs/symptoms and treatment of hyper/hypoglycemia, monitoring blood sugars, taking medications as prescribed, benefits of exercising 30 minutes per day and prevention of complications of DM.  Marland KitchenGoals 1. Keep eating 3 meals per day 2. Increase fresh fruits and vegetables. Keep drinking Avoid processed foods Don't skip meals. Avoid low blood sugars.  Teaching Method Utilized:  Visual Auditory Hands  on  Handouts given during visit include:  The Plate Method   Meal Plan Card   Barriers to learning/adherence to lifestyle change: none  Demonstrated degree of understanding via:  Teach Back   Monitoring/Evaluation:  Dietary intake, exercise, meal planning , and body weight in 1 month(s).

## 2018-06-04 ENCOUNTER — Telehealth: Payer: Self-pay

## 2018-06-04 NOTE — Telephone Encounter (Signed)
-----   Message from Venancio Poisson, NP sent at 06/04/2018 11:19 AM EDT ----- Please advise patient that his recent CT head did not show additional or worsening infarcts.  Please advise him that he should be contacted to set up CT angio as recommended by Dr. Arlean Hopping office.  If he is not contacted by midweek, advised him to call their office in order to ensure this is scheduled.

## 2018-06-04 NOTE — Telephone Encounter (Signed)
Left vm for patient to call back about Ct scan results.and recommendations to see DrBronson Curb. ------

## 2018-06-05 NOTE — Telephone Encounter (Signed)
Rn call patient about his Ct head results. Rn stated the CT head did not show any additional or worsening infarcts. RN stated Janett Billow NP wants him to call Dr. Rob Hickman office to set up the CT angio as recommend by his office. PT stated he has DR. Deveshwar number,and will call to schedule the test. He verbalized understanding. ------

## 2018-06-10 ENCOUNTER — Encounter: Payer: Self-pay | Admitting: "Endocrinology

## 2018-06-10 ENCOUNTER — Ambulatory Visit: Payer: BLUE CROSS/BLUE SHIELD | Admitting: Nutrition

## 2018-06-10 ENCOUNTER — Ambulatory Visit: Payer: BLUE CROSS/BLUE SHIELD | Admitting: "Endocrinology

## 2018-06-13 ENCOUNTER — Encounter: Payer: Self-pay | Admitting: Hematology and Oncology

## 2018-06-13 ENCOUNTER — Telehealth: Payer: Self-pay | Admitting: Hematology and Oncology

## 2018-06-13 NOTE — Telephone Encounter (Signed)
New referral received from [name] for thrombocytopenia/neutropenia. Pt has been cld and scheduled to see Dr. Audelia Hives on 10/18 at 11am. Pt aware to arrive 30 minutes early. Letter mailed.

## 2018-06-20 NOTE — Progress Notes (Signed)
[facility] Initial Consultation  Patient Name:  Mark Foster  DOB: 03-08-1956   Date of Service: June 21, 2018  Referring Provider: Hurshel Party, MD [address] [place], [place] [address]   Consulting Physician: Henreitta Leber, MD Hematology/Oncology  Reason for Referral: In the setting of leukopenia/neutropenia and thrombocytopenia over an indeterminate period of time, he presents now for further diagnostic and therapeutic recommendations.  History Present Illness: Mark Foster is a 62 year old resident of [place] whose past medical history is significant for primary hypertension; dyslipidemia; diabetic peripheral neuropathy; extensive cerebrovascular disease with extensive stenosis of the right internal carotid, proximal right middle cerebral artery, mid basilar artery, and left internal carotid artery at the bulb with severe stenosis of the proximal cavernous segment of the left internal carotid artery and left vertebral artery; previous stroke syndrome in 2010 and again in May, 2019 with placement of a cerebral angioplasty and placement of vascular stents; insulin requiring diabetes mellitus; hypothyroid disease; nephrolithiasis; cervical stenosis and cervical compression fracture; degenerative joint disease involving the thoracolumbar and ankles; and chronic depression without suicidal ideation.  His primary care physician is Dr. Hurshel Party.  He is also followed by Dr. Luanne Bras, interventional vascular radiology.  He is alone at this first visit.  Over an indeterminate period of time, he has had leukopenia/neutropenia and thrombocytopenia.  He states he was aware for the past year.  On Jan 22, 2018: A complete blood count showed hemoglobin 13.1 hematocrit 37.0 WBC 2.6; platelets 91,000.  On March 19, 2018: A complete blood count showed hemoglobin 14.0 hematocrit 39.9 WBC 2.0; platelets 103,000.  More recently, on May 01, 2018: A complete blood count showed hemoglobin 13.6 hematocrit 40.1 MCV 95.2 MCH 32.3 RDW 13.3 WBC 2.8 with 43% neutrophils 49% lymphocytes 6% monocytes 1% eosinophils; platelets 139,000.  Ferritin 220 TIBC 271 iron saturation 32% serum iron 88 folic acid 65.9 vitamin B12 >2000.  Mark Foster has no coronary artery disease, angina, or myocardial infarction.  He denies seizure disorder.  He has no peptic ulcer or gastroesophageal reflux disease.  He denies any renal or liver dysfunction.  He has no viral hepatitis or inflammatory bowel disease.  In 1995 he had a hemicolectomy for diverticulitis.  He has chronic lower back and neck pain along with pain in both ankles.  He reports no rheumatoid or gouty arthritis.  He has peripheral neuropathy involving the hands.  Following his stroke, he has experienced right upper and lower extremity weakness, expressive aphasia, memory deficit, and generalized weakness.  He has no venous thromboembolic disease. He worked formerly as a Air traffic controller.  He is divorced and lives alone.  He began smoking at the age of 43 and stopped smoking at the age of 3.  Over 3 years.  From 1992-1994, his alcohol intake was excessive.  He now consumes very little alcohol in the form of beer.  Both his appetite and weight remain stable.  His energy level is fair.  He experiences dry skin secondary to diabetes.  He has no rash or itching.  He has undergone previously laser surgery for diabetic retinopathy.  Although he denies headache, syncope, near syncopal episodes he has positional dizziness and lightheadedness.  He has no hearing deficit.  He denies cough, sore throat, or orthopnea.  He has no dyspnea either at rest or on minimal exertion.  He ambulates with the use of a cane.    He has no pain or difficulty in swallowing.  No  fever, shaking chills, sweats, or flulike symptoms are reported.  He denies chest or abdominal pain.  He reports no nausea, vomiting, diarrhea, or constipation.   Last week he had transient diarrhea for 3 days, now resolved.  There is no melena or bright red blood per rectum.  He is urinary hesitancy and incomplete bladder emptying.  There is no urinary hematuria, dysuria urgency, or frequency.  He denies swelling of his ankles.  He has chronic pain in the lower back, neck, and ankles.  He admits to difficulties in balance and disequilibrium.  He denies any bleeding tendency.  He has intermittent tingling in his hands.  It is with this background he presents now for further diagnostic and therapeutic recommendations in the setting of leukopenia/neutropenia and thrombocytopenia without obvious etiology as described above.  Past Medical History:  Diagnosis Date  . Cervical compression fracture ([facility])   . Cervical spinal stenosis   . Diabetic neuropathy ([facility])    Bilateral legs  . Diverticulitis   . History of kidney stones   . Hypertension    No current medications  . Hypothyroidism   . Stenosis of left vertebral artery   . Stroke [facility])    2011  . Type 2 diabetes mellitus ([facility])    Past Surgical History:  Procedure Laterality Date  . APPENDECTOMY    . COLON RESECTION     For diverticulitis  . COLON SURGERY    . IR ANGIO INTRA EXTRACRAN SEL COM CAROTID INNOMINATE BILAT MOD SED  01/21/2018  . IR ANGIO INTRA EXTRACRAN SEL COM CAROTID INNOMINATE UNI R MOD SED  03/21/2018  . IR ANGIO VERTEBRAL SEL VERTEBRAL BILAT MOD SED  01/21/2018  . IR TRANSCATH EXCRAN VERT OR CAR A STENT  03/21/2018  . RADIOLOGY WITH ANESTHESIA N/A 03/21/2018   Procedure: IR WITH ANESTHESIA WITH STENT PLACEMENT;  Surgeon: Luanne Bras, MD;  Location: [facility];  Service: Radiology;  Laterality: N/A;  . ROTATOR CUFF REPAIR     Left    Social History   Socioeconomic History  . Marital status: Single    Spouse name: Not on file  . Number of children: Not on file  . Years of education: Not on file  . Highest education level: Not on file  Occupational History  . Not on file   Social Needs  . Financial resource strain: Not on file  . Food insecurity:    Worry: Not on file    Inability: Not on file  . Transportation needs:    Medical: Not on file    Non-medical: Not on file  Tobacco Use  . Smoking status: Never Smoker  . Smokeless tobacco: Never Used  Substance and Sexual Activity  . Alcohol use: Not Currently  . Drug use: Never  . Sexual activity: Not on file  Lifestyle  . Physical activity:    Days per week: Not on file    Minutes per session: Not on file  . Stress: Not on file  Relationships  . Social connections:    Talks on phone: Not on file    Gets together: Not on file    Attends religious service: Not on file    Active member of club or organization: Not on file    Attends meetings of clubs or organizations: Not on file    Relationship status: Not on file  . Intimate partner violence:    Fear of current or ex partner: Not on file    Emotionally abused: Not on  file    Physically abused: Not on file    Forced sexual activity: Not on file  Other Topics Concern  . Not on file  Social History Narrative  . 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  He is divorced. He has 2 children: His son at age 15 years has no major medical problems. His daughter at age 26 years has severe scoliosis. He began smoking at the age of 59 years. He smoked as much as 3 packs of cigarettes daily. He stopped smoking at the age of 66 years. He reports no significant use of pipe, cigars, or chewing tobacco. His alcohol consumption was heavy for 3-year period in 1992 through 1994. He rarely consumes beer now. He reports no recreational drug use.  Transfusion History: No prior transfusion  Exposure History: No known exposure to toxic chemicals, radiation, or pesticides.   Allergies  Allergen Reactions  . Demerol [Meperidine Hcl] Other (See Comments)    convulsions    Current Outpatient Medications on File Prior to Visit   Medication Sig  . aspirin 81 MG tablet Take 1 tablet (81 mg total) by mouth daily.  Marland Kitchen atorvastatin (LIPITOR) 80 MG tablet Take 1 tablet (80 mg total) by mouth daily at 6 PM.  . Continuous Blood Gluc Sensor (FREESTYLE LIBRE 14 DAY SENSOR) MISC 1 Device by Does not apply route every 14 (fourteen) days.  Marland Kitchen ECHINACEA EXTRACT PO Take 760 mg by mouth daily.  Marland Kitchen gabapentin (NEURONTIN) 300 MG capsule Take 1 capsule (300 mg total) by mouth at bedtime.  . insulin glargine (LANTUS) 100 unit/mL SOPN Inject 0.1 mLs (10 Units total) into the skin at bedtime. (Patient taking differently: Inject 20 Units into the skin every morning. )  . Insulin Pen Needle 32G X 4 MM MISC 1 Device by Does not apply route daily.  . NP THYROID 30 MG tablet Take 30 mg by mouth daily before breakfast.   . sertraline (ZOLOFT) 50 MG tablet Take 50 mg by mouth every morning.   . ticagrelor (BRILINTA) 90 MG TABS tablet Take 90 mg by mouth 2 (two) times daily.   No current facility-administered medications on file prior to visit.     Review of Systems: Constitutional: No fever, sweats, or shaking chills.  No appetite or weight deficit; energy level fair. Skin: No rash, scaling, sores, lumps, or jaundice. HEENT: Diabetic retinopathy.  No hearing deficit; no allergic rhinitis or sinusitis. Pulmonary: No unusual cough, sore throat, or orthopnea.  No DOE. Cardiovascular: No coronary artery disease, angina, or myocardial infarction.  No cardiac dysrhythmia.  Primary essential hypertension and dyslipidemia. Gastrointestinal: No indigestion, dysphagia, abdominal pain, diarrhea, or constipation.  No change in bowel habits. Genitourinary: No urinary frequency, urgency, hematuria, or dysuria; urinary retention; hesitancy. Musculoskeletal: Arthralgias involving the neck, lower back, and ankles.  No myalgias; no joint swelling or instability. Hematologic: No bleeding tendency; easy bruisability; leukopenia/neutropenia;  thrombocytopenia. Endocrine: No intolerance to heat or cold; hypothyroid disease and insulin requiring diabetes mellitus. Vascular: Extensive peripheral arterial/carotid/cerebrovascular disease; no venous thromboembolic disease. Psychological: Chronic depression; no anxiety or mood changes. Neurological: Positional dizziness and lightheadedness; disequilibrium and imbalance; tingling of the hands intermittently; cerebrovascular and carotid artery disease; expressive aphasia; no syncope, or near syncopal episodes; tingling in the hands.  No lower extremity paresthesias.  Physical Examination: Vital Signs: Body surface area is 1.86 meters squared.  Vitals:   06/21/18 1126  BP: 111/67  Pulse: 87  Resp: 18  Temp: 98.5 F (  36.9 C)  SpO2: 98%    Filed Weights   06/21/18 1126  Weight: 154 lb 8 oz (70.1 kg)  ECOG PERFORMANCE STATUS: 1-2 Constitutional:  Mark Foster is fully nourished and developed.  He looks older than his stated age.  He is friendly and cooperative without respiratory compromise at rest. Skin: No rashes, scaling, dryness, jaundice, or itching. HEENT: Head is normocephalic and atraumatic.  Pupils are equal round and reactive to light and accommodation.  Sclerae are anicteric.  Conjunctivae are pink.  No sinus tenderness nor oropharyngeal lesions.  Lips without cracking or peeling; tongue without mass, inflammation, or nodularity.  He has dentures.  Mucous membranes are moist. Neck: Supple and symmetric.  No jugular venous distention or thyromegaly.  Trachea is midline. Lymphatics: No cervical or supraclavicular lymphadenopathy.  No epitrochlear, axillary, or inguinal lymphadenopathy is appreciated. Respiratory/chest: Thorax is symmetrical.  Breath sounds are clear to auscultation and percussion.  Normal excursion and respiratory effort. Back: Symmetric without deformity or tenderness. Cardiovascular: Heart rate and rhythm are regular. Gastrointestinal: Abdomen is flat, soft,,  nontender; no organomegaly.  Bowel sounds are normoactive.  No masses are appreciated. Rectal examination: Not performed. Extremities: In the lower extremities, there is no asymmetric swelling, erythema, tenderness, or cord formation.  No clubbing, cyanosis, nor edema. Hematologic: No petechiae, hematomas, or ecchymoses. Psychological:  He is oriented to person, place, and time; normal affect. Neurological: Right upper extremity weakness: 4/5; right lower extremity weakness: 3/5.  Both sensation and motor function are otherwise intact.  Laboratory Results: I have reviewed the data as listed:  Ref Range & Units 13:44 (06/21/18) 66moago (03/22/18) 389mogo (03/19/18) 69m798moo (01/22/18)  WBC Count 4.0 - 10.5 K/uL 3.1Low   2.0Low   2.0Low   2.6Low    RBC 4.22 - 5.81 MIL/uL 4.22  3.60Low   4.31  4.02Low    Hemoglobin 13.0 - 17.0 g/dL 13.7  11.5Low   14.0  13.1   HCT 39.0 - 52.0 % 39.6  33.5Low   39.9  37.0Low    MCV 80.0 - 100.0 fL 93.8  93.1 R 92.6 R 92.0 R  MCH 26.0 - 34.0 pg 32.5  31.9  32.5  32.6   MCHC 30.0 - 36.0 g/dL 34.6  34.3  35.1  35.4   RDW 11.5 - 15.5 % 13.6  13.6  13.5  13.3   Platelet Count 150 - 400 K/uL 106Low   80Low  CM 103Low  CM 91Low  CM  nRBC 0.0 - 0.2 % 0.0      Neutrophils Relative % % 53  37  51    Neutro Abs 1.7 - 7.7 K/uL 1.7  0.7Low   1.0Low     Lymphocytes Relative % 41  54  44    Lymphs Abs 0.7 - 4.0 K/uL 1.3  1.2  0.9    Monocytes Relative % '5  6  4    '$ Monocytes Absolute 0.1 - 1.0 K/uL 0.1  0.1  0.1    Eosinophils Relative % '1  2  1    '$ Eosinophils Absolute 0.0 - 0.5 K/uL 0.0  0.0 R 0.0 R   Basophils Relative % 0  1  0    Basophils Absolute 0.0 - 0.1 K/uL 0.0  0.0  0.0    Immature Granulocytes % 0      Abs Immature Granulocytes 0.00 - 0.07 K/uL 0.00        Ref Range & Units 98mo84mo (05/20/18) 77mo 30mo(  03/22/18) 81moago (03/19/18) 531mogo (01/22/18)  Sodium 135 - 145 mmol/L 142  141  141  141   Potassium 3.5 - 5.1 mmol/L 3.8  3.4Low   3.8  3.5   Chloride  98 - 111 mmol/L 108  109 CM 107 CM 108 R  CO2 22 - 32 mmol/L '27  24  27  25   '$ Glucose, Bld 70 - 99 mg/dL 67Low   130High  CM 167High  CM 154High  R  BUN 8 - 23 mg/dL 18  14 CM 20 CM 9 R  Creatinine, Ser 0.61 - 1.24 mg/dL 0.63  0.61  0.60Low   0.62   Calcium 8.9 - 10.3 mg/dL 9.2  8.4Low   9.4  8.6Low    Total Protein 6.5 - 8.1 g/dL 7.2      Albumin 3.5 - 5.0 g/dL 4.3      AST 15 - 41 U/L 14Low       ALT 0 - 44 U/L 23      Alkaline Phosphatase 38 - 126 U/L 56      Total Bilirubin 0.3 - 1.2 mg/dL 1.0      GFR calc non Af Amer >60 mL/min >60  >60  >60  >60   GFR calc Af Amer >60 mL/min >60  >60 CM >60 CM >60 CM  Comment: (NOTE)  The eGFR has been calculated using the CKD EPI equation.  This calculation has not been validated in all clinical situations.  eGFR's persistently <60 mL/min signify possible Chronic Kidney  Disease.   Anion gap 5 - '15 7  8 '$ CM 7 CM 8 CM   Diagnostic[facility]Imaging [facility]: [facility] [address] [phone] of the head without contrast  COMPARISON FILMS: CT scan 01/19/2018  TECHNIQUE: CT scan of the head was obtained utilizing 5 mm axial slices from the skull base to the vertex. CONTRAST: None IMAGING SITE[id number]: There are no intracranial hemorrhages.  There is a remote infarction involving the left posterior frontal/parietal region, unchanged compared to the previous CT scan.  This is in the MCA distribution.  There is mild compensatory enlargement of the posterior horn of the left lateral ventricle due to the encephalomalacia. No evidence of mass effect or midline shift.  The cerebellum, brainstem and deep gray matter appears normal. The hemispheres appear normal. The orbits and their contents, paranasal sinuses and calvarium are unremarkable. Calcification is noted in the internal carotid arteries in the siphons.   No acute findings.  The  CT scan is unchanged compared to the 01/19/2018 CT scan.  IMPRESSION: This CT scan of the head without contrast shows the following: 1.   Remote infarction in the left frontoparietal region unchanged in appearance compared to the 01/19/2018 CT scan. 2.   Calcification of the internal carotid artery siphons, also unchanged. 3.   There are no acute findings.  Mark Foster A. SaFelecia ShellingMD, PhD, FAAN Certified in  Neuroimaging by [facility]  Summary/Assessment: In the setting of leukopenia/neutropenia and thrombocytopenia over an indeterminate period of time, he presents now for further diagnostic and therapeutic recommendations.  Over a period of time, he has had leukopenia/neutropenia and thrombocytopenia.  He states he was aware of the low white blood cell and platelet count over the past year.  On Jan 22, 2018: A complete blood count showed hemoglobin 13.1 hematocrit 37.0 WBC 2.6; platelets  91,000.  On March 19, 2018: A complete blood count showed hemoglobin 14.0 hematocrit 39.9 WBC 2.0; platelets 103,000.  More recently, on May 01, 2018: A complete blood count showed hemoglobin 13.6 hematocrit 40.1 MCV 95.2 MCH 32.3 RDW 13.3 WBC 2.8 with 43% neutrophils 49% lymphocytes 6% monocytes 1% eosinophils; platelets 139,000.  Ferritin 220 TIBC 271 iron saturation 32% serum iron 88 folic acid 82.6 vitamin B12 >2000.  Mark Foster has no coronary artery disease, angina, or myocardial infarction.  He denies seizure disorder.  He has no peptic ulcer or gastroesophageal reflux disease.  He denies any renal or liver dysfunction.  He has no viral hepatitis or inflammatory bowel disease.  In 1995 he had a hemicolectomy for diverticulitis.  He has chronic lower back and neck pain along with pain in both ankles.  He reports no rheumatoid or gouty arthritis.  He has peripheral neuropathy involving the hands.  Following his stroke, he has experienced right upper and lower extremity weakness, expressive aphasia,  memory deficit, and generalized weakness.  He has no venous thromboembolic disease.  Both his appetite and weight remain stable.  His energy level is fair.  He experiences dry skin secondary to diabetes.  He is on insulin.  He has no rash or itching.  He has undergone previously laser surgery for diabetic retinopathy.  Although he denies headache, syncope, near syncopal episodes, he has positional dizziness and lightheadedness.  He has no hearing deficit.  He denies cough, sore throat, or orthopnea.  He has no dyspnea either at rest or on minimal exertion.  He ambulates with the use of a cane.  There is no known history of blood disorder or bleeding tendency in his immediate family.  He has no pain or difficulty in swallowing.  No fever, shaking chills, sweats, or flulike symptoms are reported.  He denies chest or abdominal pain.  He reports no nausea, vomiting, diarrhea, or constipation.  Last week he had transient diarrhea for 3 days, now resolved.  There is no melena or bright red blood per rectum.  He is urinary hesitancy and incomplete bladder emptying.  There is no urinary hematuria, dysuria urgency, or frequency.  He denies swelling of his ankles.  He has chronic pain in the lower back, neck, and ankles.  He admits to difficulties in balance and disequilibrium.  He denies any bleeding tendency.  He has intermittent tingling in his hands.    His other comorbid problems include primary hypertension; dyslipidemia; diabetic peripheral neuropathy; extensive cerebrovascular disease with extensive stenosis of the right internal carotid, proximal right middle cerebral artery, mid basilar artery, and left internal carotid artery at the bulb with severe stenosis of the proximal cavernous segment of the left internal carotid artery and left vertebral artery; previous stroke syndrome in 2010 and again in May, 2019 with placement of a cerebral angioplasty and placement of vascular stents; insulin requiring diabetes  mellitus; hypothyroid disease; nephrolithiasis; cervical stenosis and cervical compression fracture; degenerative joint disease involving the thoracolumbar and ankles; and chronic depression without suicidal ideation.    Recommendation/Plan: We discussed in detail his lengthy and complex history.  The potential causes for his leukopenia and thrombocytopenia are many.  A battery of laboratory studies were obtained to exclude an underlying nutritional deficit, hemolytic process, metabolic anomaly, paraproteinemia, antiphospholipid antibody, chronic disseminated intravascular coagulopathy; thyroid dysfunction; copper deficiency, connective tissue disorder; or infectious/inflammatory etiology.  The results of those laboratory studies were not available at the time of discharge.  Barring any unforeseen complications,  his next scheduled doctor visit to discuss those results with recommendations is on July 04, 2018.  He was advised to call us in the interim should any new or untoward problems arise.  The total time spent discussing his prior history, past laboratory studies, importance and rationale for identifying the cause of his leukopenia/neutropenia and thrombocytopenia, methodology for evaluating them with considerations was 60 minutes.  At least 50% of that time was spent in discussion, counseling, and answering questions. All questions were answered to his satisfaction.   This note was dictated using voice activated technology/software.  Unfortunately, typographical errors are not uncommon, and transcription is subject to mistakes and regrettably misinterpretation.  If necessary, clarification of the above information can be discussed with me at any time.  Thank you [name] for allowing my participation in the care of Mark Foster. I will keep you closely informed as the results of his preliminary laboratory data become available.  Please do not hesitate to call should any questions arise  regarding this initial consultation and discussion.  FOLLOW UP: AS DIRECTED   cc:     Hurshel Party, MD           Luanne Bras MD   Henreitta Leber, MD  Hematology/Oncology [place] [address] [place], [place] [address] Office: [phone] [facility]: [phone]

## 2018-06-21 ENCOUNTER — Inpatient Hospital Stay: Payer: BLUE CROSS/BLUE SHIELD | Attending: Hematology and Oncology | Admitting: Hematology and Oncology

## 2018-06-21 ENCOUNTER — Telehealth: Payer: Self-pay

## 2018-06-21 ENCOUNTER — Encounter: Payer: Self-pay | Admitting: Hematology and Oncology

## 2018-06-21 ENCOUNTER — Inpatient Hospital Stay: Payer: BLUE CROSS/BLUE SHIELD

## 2018-06-21 VITALS — BP 111/67 | HR 87 | Temp 98.5°F | Resp 18 | Ht 70.0 in | Wt 154.5 lb

## 2018-06-21 DIAGNOSIS — G8929 Other chronic pain: Secondary | ICD-10-CM | POA: Insufficient documentation

## 2018-06-21 DIAGNOSIS — E11319 Type 2 diabetes mellitus with unspecified diabetic retinopathy without macular edema: Secondary | ICD-10-CM | POA: Diagnosis not present

## 2018-06-21 DIAGNOSIS — D696 Thrombocytopenia, unspecified: Secondary | ICD-10-CM

## 2018-06-21 DIAGNOSIS — R339 Retention of urine, unspecified: Secondary | ICD-10-CM | POA: Insufficient documentation

## 2018-06-21 DIAGNOSIS — I69359 Hemiplegia and hemiparesis following cerebral infarction affecting unspecified side: Secondary | ICD-10-CM | POA: Diagnosis not present

## 2018-06-21 DIAGNOSIS — I69320 Aphasia following cerebral infarction: Secondary | ICD-10-CM | POA: Diagnosis not present

## 2018-06-21 DIAGNOSIS — Z7982 Long term (current) use of aspirin: Secondary | ICD-10-CM | POA: Diagnosis not present

## 2018-06-21 DIAGNOSIS — E1142 Type 2 diabetes mellitus with diabetic polyneuropathy: Secondary | ICD-10-CM | POA: Diagnosis not present

## 2018-06-21 DIAGNOSIS — Z87891 Personal history of nicotine dependence: Secondary | ICD-10-CM | POA: Diagnosis not present

## 2018-06-21 DIAGNOSIS — D72819 Decreased white blood cell count, unspecified: Secondary | ICD-10-CM | POA: Diagnosis not present

## 2018-06-21 DIAGNOSIS — E785 Hyperlipidemia, unspecified: Secondary | ICD-10-CM | POA: Insufficient documentation

## 2018-06-21 DIAGNOSIS — E039 Hypothyroidism, unspecified: Secondary | ICD-10-CM | POA: Insufficient documentation

## 2018-06-21 DIAGNOSIS — R3911 Hesitancy of micturition: Secondary | ICD-10-CM | POA: Diagnosis not present

## 2018-06-21 DIAGNOSIS — M4802 Spinal stenosis, cervical region: Secondary | ICD-10-CM | POA: Insufficient documentation

## 2018-06-21 DIAGNOSIS — M545 Low back pain: Secondary | ICD-10-CM | POA: Insufficient documentation

## 2018-06-21 DIAGNOSIS — F329 Major depressive disorder, single episode, unspecified: Secondary | ICD-10-CM | POA: Diagnosis not present

## 2018-06-21 DIAGNOSIS — I10 Essential (primary) hypertension: Secondary | ICD-10-CM | POA: Diagnosis not present

## 2018-06-21 DIAGNOSIS — Z79899 Other long term (current) drug therapy: Secondary | ICD-10-CM

## 2018-06-21 DIAGNOSIS — Z87442 Personal history of urinary calculi: Secondary | ICD-10-CM | POA: Insufficient documentation

## 2018-06-21 DIAGNOSIS — Z794 Long term (current) use of insulin: Secondary | ICD-10-CM | POA: Diagnosis not present

## 2018-06-21 LAB — RETIC PANEL
Immature Retic Fract: 8.3 % (ref 2.3–15.9)
RBC.: 4.22 MIL/uL (ref 4.22–5.81)
RETICULOCYTE HEMOGLOBIN: 37.6 pg (ref >27.9)
Retic Count, Absolute: 53.2 10*3/uL (ref 19.0–186.0)
Retic Ct Pct: 1.3 % (ref 0.4–3.1)

## 2018-06-21 LAB — TSH: TSH: 0.472 u[IU]/mL (ref 0.320–4.118)

## 2018-06-21 LAB — FERRITIN: Ferritin: 176 ng/mL (ref 24–336)

## 2018-06-21 LAB — COMPREHENSIVE METABOLIC PANEL
ALT: 21 U/L (ref 0–44)
AST: 15 U/L (ref 15–41)
Albumin: 4 g/dL (ref 3.5–5.0)
Alkaline Phosphatase: 65 U/L (ref 38–126)
Anion gap: 9 (ref 5–15)
BILIRUBIN TOTAL: 1 mg/dL (ref 0.3–1.2)
BUN: 19 mg/dL (ref 8–23)
CALCIUM: 9.2 mg/dL (ref 8.9–10.3)
CO2: 26 mmol/L (ref 22–32)
CREATININE: 0.76 mg/dL (ref 0.61–1.24)
Chloride: 108 mmol/L (ref 98–111)
Glucose, Bld: 105 mg/dL — ABNORMAL HIGH (ref 70–99)
Potassium: 3.8 mmol/L (ref 3.5–5.1)
Sodium: 143 mmol/L (ref 135–145)
TOTAL PROTEIN: 7 g/dL (ref 6.5–8.1)

## 2018-06-21 LAB — CBC WITH DIFFERENTIAL (CANCER CENTER ONLY)
ABS IMMATURE GRANULOCYTES: 0 10*3/uL (ref 0.00–0.07)
BASOS PCT: 0 %
Basophils Absolute: 0 10*3/uL (ref 0.0–0.1)
EOS ABS: 0 10*3/uL (ref 0.0–0.5)
Eosinophils Relative: 1 %
HEMATOCRIT: 39.6 % (ref 39.0–52.0)
Hemoglobin: 13.7 g/dL (ref 13.0–17.0)
IMMATURE GRANULOCYTES: 0 %
LYMPHS ABS: 1.3 10*3/uL (ref 0.7–4.0)
Lymphocytes Relative: 41 %
MCH: 32.5 pg (ref 26.0–34.0)
MCHC: 34.6 g/dL (ref 30.0–36.0)
MCV: 93.8 fL (ref 80.0–100.0)
MONO ABS: 0.1 10*3/uL (ref 0.1–1.0)
MONOS PCT: 5 %
NEUTROS ABS: 1.7 10*3/uL (ref 1.7–7.7)
NEUTROS PCT: 53 %
Platelet Count: 106 10*3/uL — ABNORMAL LOW (ref 150–400)
RBC: 4.22 MIL/uL (ref 4.22–5.81)
RDW: 13.6 % (ref 11.5–15.5)
WBC Count: 3.1 10*3/uL — ABNORMAL LOW (ref 4.0–10.5)
nRBC: 0 % (ref 0.0–0.2)

## 2018-06-21 LAB — DIC (DISSEMINATED INTRAVASCULAR COAGULATION) PANEL
APTT: 28 s (ref 24–36)
INR: 1
PROTHROMBIN TIME: 13.1 s (ref 11.4–15.2)

## 2018-06-21 LAB — DIC (DISSEMINATED INTRAVASCULAR COAGULATION)PANEL
D-Dimer, Quant: 0.43 ug/mL-FEU (ref 0.00–0.50)
Fibrinogen: 415 mg/dL (ref 210–475)
Platelets: 109 10*3/uL — ABNORMAL LOW (ref 150–400)
Smear Review: NONE SEEN

## 2018-06-21 LAB — C-REACTIVE PROTEIN

## 2018-06-21 LAB — DIRECT ANTIGLOBULIN TEST (NOT AT ARMC)
DAT, IgG: NEGATIVE
DAT, complement: NEGATIVE

## 2018-06-21 LAB — LACTATE DEHYDROGENASE: LDH: 157 U/L (ref 98–192)

## 2018-06-21 LAB — TECHNOLOGIST SMEAR REVIEW

## 2018-06-21 LAB — FOLATE: FOLATE: 7.3 ng/mL (ref >5.9)

## 2018-06-21 NOTE — Telephone Encounter (Signed)
Priinted avs and calender of upcoming appointment. Per 10/18 los

## 2018-06-21 NOTE — Patient Instructions (Signed)
The results of your previous laboratory studies were reviewed and discussed in detail.  Your white blood cell and platelet count are low over the last 6-12 months.  Laboratory studies are requested today to identify nutritional deficiency, premature destruction of cells, abnormal protein in the blood, infection/inflammation, iron deficiency, or autoimmune phenomenon.  Those results will be discussed in detail the time of your next visit.  Barring any unforeseen complications, your next scheduled doctor visit to discuss those results is now scheduled on October 31.  Please do not hesitate to call if any questions or problems arise in the interim.  Thank you! Ladona Ridgel, MD Hematology/Oncology [phone]

## 2018-06-22 LAB — BETA-2-GLYCOPROTEIN I ABS, IGG/M/A
Beta-2 Glyco I IgG: <9 GPI IgG units (ref 0–20)
Beta-2-Glycoprotein I IgA: <9 GPI IgA units (ref 0–25)
Beta-2-Glycoprotein I IgM: <9 GPI IgM units (ref 0–32)

## 2018-06-22 LAB — CARDIOLIPIN ANTIBODIES, IGG, IGM, IGA: Anticardiolipin IgM: <9 MPL U/mL (ref 0–12)

## 2018-06-22 LAB — HEPATITIS B SURFACE ANTIGEN: HEP B S AG: NEGATIVE

## 2018-06-22 LAB — IGG, IGA, IGM
IGA: 164 mg/dL (ref 61–437)
IGM (IMMUNOGLOBULIN M), SRM: 61 mg/dL (ref 20–172)
IgG (Immunoglobin G), Serum: 914 mg/dL (ref 700–1600)

## 2018-06-22 LAB — HAPTOGLOBIN: Haptoglobin: 99 mg/dL (ref 34–200)

## 2018-06-22 LAB — HEPATITIS C ANTIBODY: HCV Ab: <0.1 s/co ratio (ref 0.0–0.9)

## 2018-06-22 LAB — HEPATITIS B CORE ANTIBODY, TOTAL: HEP B C TOTAL AB: NEGATIVE

## 2018-06-23 LAB — COPPER, SERUM: COPPER: 113 ug/dL (ref 72–166)

## 2018-06-24 LAB — PROTEIN ELECTROPHORESIS, SERUM
A/G Ratio: 1.4 (ref 0.7–1.7)
Albumin ELP: 3.9 g/dL (ref 2.9–4.4)
Alpha-1-Globulin: 0.3 g/dL (ref 0.0–0.4)
Alpha-2-Globulin: 0.6 g/dL (ref 0.4–1.0)
Beta Globulin: 0.8 g/dL (ref 0.7–1.3)
GAMMA GLOBULIN: 0.9 g/dL (ref 0.4–1.8)
Globulin, Total: 2.7 g/dL (ref 2.2–3.9)
TOTAL PROTEIN ELP: 6.6 g/dL (ref 6.0–8.5)

## 2018-06-24 LAB — ANTINUCLEAR ANTIBODIES, IFA: ANA Ab, IFA: NEGATIVE

## 2018-06-24 LAB — IMMUNOFIXATION ELECTROPHORESIS
IGA: 163 mg/dL (ref 61–437)
IgG (Immunoglobin G), Serum: 899 mg/dL (ref 700–1600)
IgM (Immunoglobulin M), Srm: 60 mg/dL (ref 20–172)
Total Protein ELP: 6.5 g/dL (ref 6.0–8.5)

## 2018-06-24 LAB — KAPPA/LAMBDA LIGHT CHAINS
Kappa free light chain: 20.2 mg/L — ABNORMAL HIGH (ref 3.3–19.4)
Kappa, lambda light chain ratio: 1.29 (ref 0.26–1.65)
Lambda free light chains: 15.6 mg/L (ref 5.7–26.3)

## 2018-07-03 ENCOUNTER — Ambulatory Visit: Payer: BLUE CROSS/BLUE SHIELD | Admitting: Urology

## 2018-07-03 DIAGNOSIS — R3912 Poor urinary stream: Secondary | ICD-10-CM | POA: Diagnosis not present

## 2018-07-03 DIAGNOSIS — N5201 Erectile dysfunction due to arterial insufficiency: Secondary | ICD-10-CM | POA: Diagnosis not present

## 2018-07-04 ENCOUNTER — Encounter: Payer: Self-pay | Admitting: Hematology and Oncology

## 2018-07-04 ENCOUNTER — Inpatient Hospital Stay: Payer: BLUE CROSS/BLUE SHIELD | Admitting: Hematology and Oncology

## 2018-07-04 ENCOUNTER — Inpatient Hospital Stay: Payer: BLUE CROSS/BLUE SHIELD

## 2018-07-04 VITALS — BP 105/70 | HR 85 | Temp 98.0°F | Resp 18 | Ht 70.0 in | Wt 153.5 lb

## 2018-07-04 DIAGNOSIS — Z87442 Personal history of urinary calculi: Secondary | ICD-10-CM

## 2018-07-04 DIAGNOSIS — G8929 Other chronic pain: Secondary | ICD-10-CM

## 2018-07-04 DIAGNOSIS — M545 Low back pain: Secondary | ICD-10-CM

## 2018-07-04 DIAGNOSIS — I10 Essential (primary) hypertension: Secondary | ICD-10-CM | POA: Diagnosis not present

## 2018-07-04 DIAGNOSIS — D72819 Decreased white blood cell count, unspecified: Secondary | ICD-10-CM

## 2018-07-04 DIAGNOSIS — Z79899 Other long term (current) drug therapy: Secondary | ICD-10-CM

## 2018-07-04 DIAGNOSIS — E785 Hyperlipidemia, unspecified: Secondary | ICD-10-CM

## 2018-07-04 DIAGNOSIS — D696 Thrombocytopenia, unspecified: Secondary | ICD-10-CM

## 2018-07-04 DIAGNOSIS — F329 Major depressive disorder, single episode, unspecified: Secondary | ICD-10-CM

## 2018-07-04 DIAGNOSIS — Z7982 Long term (current) use of aspirin: Secondary | ICD-10-CM

## 2018-07-04 DIAGNOSIS — I69320 Aphasia following cerebral infarction: Secondary | ICD-10-CM

## 2018-07-04 DIAGNOSIS — M4802 Spinal stenosis, cervical region: Secondary | ICD-10-CM

## 2018-07-04 DIAGNOSIS — Z87891 Personal history of nicotine dependence: Secondary | ICD-10-CM

## 2018-07-04 DIAGNOSIS — I69359 Hemiplegia and hemiparesis following cerebral infarction affecting unspecified side: Secondary | ICD-10-CM

## 2018-07-04 DIAGNOSIS — E11319 Type 2 diabetes mellitus with unspecified diabetic retinopathy without macular edema: Secondary | ICD-10-CM

## 2018-07-04 DIAGNOSIS — R339 Retention of urine, unspecified: Secondary | ICD-10-CM

## 2018-07-04 DIAGNOSIS — R3911 Hesitancy of micturition: Secondary | ICD-10-CM

## 2018-07-04 DIAGNOSIS — Z794 Long term (current) use of insulin: Secondary | ICD-10-CM

## 2018-07-04 DIAGNOSIS — E1142 Type 2 diabetes mellitus with diabetic polyneuropathy: Secondary | ICD-10-CM

## 2018-07-04 DIAGNOSIS — E039 Hypothyroidism, unspecified: Secondary | ICD-10-CM

## 2018-07-04 LAB — IRON AND TIBC
Iron: 67 ug/dL (ref 42–163)
Saturation Ratios: 25 % (ref 20–55)
TIBC: 264 ug/dL (ref 202–409)
UIBC: 197 ug/dL (ref 117–376)

## 2018-07-04 LAB — FERRITIN: FERRITIN: 191 ng/mL (ref 24–336)

## 2018-07-04 NOTE — Progress Notes (Signed)
[facility] Outpatient Progress Note  Patient Name:  Mark Foster  DOB: 09-05-1955   Date of Service: July 04, 2018  Referring Provider: Doree Albee, [address] [place], [place] [address]   Consulting Physician: Henreitta Leber, MD Hematology/Oncology  Reason for Visit: In the setting of leukopenia/neutropenia and thrombocytopenia over an indeterminate period of time, he presents now for the results of his preliminary laboratory evaluation and recommendations.  Brief History: Mark Foster is a 62 year old resident of [place] whose past medical history is significant for primary hypertension; dyslipidemia; diabetic peripheral neuropathy; extensive cerebrovascular disease with extensive stenosis of the right internal carotid, proximal right middle cerebral artery, mid basilar artery, and left internal carotid artery at the bulb with severe stenosis of the proximal cavernous segment of the left internal carotid artery and left vertebral artery; previous stroke syndrome in 2010 and again in May, 2019 with placement of a cerebral angioplasty and placement of vascular stents; insulin requiring diabetes mellitus; hypothyroid disease; nephrolithiasis; cervical stenosis and cervical compression fracture; degenerative joint disease involving the thoracolumbar and ankles; and chronic depression without suicidal ideation.    His primary care physician is Dr. Hurshel Party.  He is also followed by Dr. Luanne Bras, interventional vascular radiology. He is alone at this visit. Over an indeterminate period of time, he has had leukopenia/neutropenia and thrombocytopenia.  He states he was aware for the past year.  On Jan 22, 2018: A complete blood count showed hemoglobin 13.1 hematocrit 37.0 WBC 2.6; platelets 91,000.  On March 19, 2018: A complete blood count showed hemoglobin 14.0 hematocrit 39.9 WBC 2.0; platelets 103,000.  More recently, on  May 01, 2018: A complete blood count showed hemoglobin 13.6 hematocrit 40.1 MCV 95.2 MCH 32.3 RDW 13.3 WBC 2.8 with 43% neutrophils 49% lymphocytes 6% monocytes 1% eosinophils; platelets 139,000.  Ferritin 220 TIBC 271 iron saturation 32% serum iron 88 folic acid 24.0 vitamin B12 >2000.  In 1995 he had a hemicolectomy for diverticulitis.  He has chronic lower back and neck pain along with pain in both ankles.  He reports no rheumatoid or gouty arthritis. He has peripheral neuropathy involving the hands. Following his stroke, he has experienced right upper and lower extremity weakness, expressive aphasia, memory deficit, and generalized weakness. He has no venous thromboembolic disease. He worked formerly as a Air traffic controller.  He is divorced and lives alone. He began smoking at the age of 46 and stopped smoking at the age of 57.  Over 3 years. From 1992-1994, his alcohol intake was excessive. He now consumes very little alcohol in the form of beer.  At the time of his initial visit, a battery of laboratory studies were obtained to exclude an underlying nutritional deficit, hemolytic process, metabolic anomaly, paraproteinemia, antiphospholipid antibody, chronic disseminated intravascular coagulopathy; thyroid dysfunction; copper deficiency, connective tissue disorder; or infectious/inflammatory etiology.  The results of those laboratory studies were not available at the time of discharge.  Those results are outlined below. It is with this background he presents now for further discussion and recommendations in the setting of leukopenia/neutropenia and thrombocytopenia as described above.  Interval History: In the interim since his last visit, he denies any new problems or complaints. Both his appetite and weight remain stable. His energy level is fair. He experiences dry skin secondary to diabetes.  He has no rash or itching. He has undergone previously laser surgery for diabetic retinopathy.  Although he denies headache, syncope, near syncopal episodes he  has positional dizziness and lightheadedness.  He has no hearing deficit.  He denies cough, sore throat, or orthopnea.  He has no dyspnea either at rest or on minimal exertion.  He ambulates with the use of a cane. He has no pain or difficulty in swallowing.  No fever, shaking chills, sweats, or flulike symptoms are reported.  He denies chest or abdominal pain.  He reports no nausea, vomiting, diarrhea, or constipation. There is no melena or bright red blood per rectum. He has urinary hesitancy and incomplete bladder emptying. There is no urinary hematuria, dysuria urgency, or frequency. He denies swelling of his ankles. He has chronic pain in the lower back, neck, and ankles.  He admits to difficulties in balance and disequilibrium. He denies any bleeding tendency. He has intermittent tingling in his hands.   Past Medical History:  Diagnosis Date  . Cervical compression fracture ([facility])   . Cervical spinal stenosis   . Diabetic neuropathy ([facility])    Bilateral legs  . Diverticulitis   . History of kidney stones   . Hypertension    No current medications  . Hypothyroidism   . Stenosis of left vertebral artery   . Stroke [facility])    2011  . Type 2 diabetes mellitus [facility])    Past Medical History Reviewed        Family History Reviewed       Social History Reviewed  Allergies  Allergen Reactions  . Demerol [Meperidine Hcl] Other (See Comments)    convulsions   Current Outpatient Medications on File Prior to Visit  Medication Sig  . aspirin 81 MG tablet Take 1 tablet (81 mg total) by mouth daily.  Marland Kitchen atorvastatin (LIPITOR) 80 MG tablet Take 1 tablet (80 mg total) by mouth daily at 6 PM.  . Continuous Blood Gluc Sensor (FREESTYLE LIBRE 14 DAY SENSOR) MISC 1 Device by Does not apply route every 14 (fourteen) days.  Marland Kitchen ECHINACEA EXTRACT PO Take 760 mg by mouth daily.  Marland Kitchen gabapentin (NEURONTIN) 300 MG capsule Take 1 capsule (300  mg total) by mouth at bedtime.  . insulin glargine (LANTUS) 100 unit/mL SOPN Inject 0.1 mLs (10 Units total) into the skin at bedtime. (Patient taking differently: Inject 20 Units into the skin every morning. )  . Insulin Pen Needle 32G X 4 MM MISC 1 Device by Does not apply route daily.  . NP THYROID 30 MG tablet Take 30 mg by mouth daily before breakfast.   . sertraline (ZOLOFT) 50 MG tablet Take 50 mg by mouth every morning.   . ticagrelor (BRILINTA) 90 MG TABS tablet Take 90 mg by mouth 2 (two) times daily.   No current facility-administered medications on file prior to visit.    Review of Systems: Constitutional: No fever, sweats, or shaking chills.  No appetite or weight deficit; energy level fair. Skin: No rash, scaling, sores, lumps, or jaundice. HEENT: Diabetic retinopathy.  No hearing deficit; no allergic rhinitis or sinusitis. Pulmonary: No unusual cough, sore throat, or orthopnea.  No DOE. Cardiovascular: No coronary artery disease, angina, or myocardial infarction.  No cardiac dysrhythmia.  Primary essential hypertension and dyslipidemia. Gastrointestinal: No indigestion, dysphagia, abdominal pain, diarrhea, or constipation.  No change in bowel habits. Genitourinary: No urinary frequency, urgency, hematuria, or dysuria; urinary retention; hesitancy. Musculoskeletal: Arthralgias involving the neck, lower back, and ankles.  No myalgias; no joint swelling or instability. Hematologic: No bleeding tendency; easy bruisability; leukopenia/neutropenia; thrombocytopenia. Endocrine: No intolerance to heat or cold; hypothyroid disease  and insulin requiring diabetes mellitus. Vascular: Extensive peripheral arterial/carotid/cerebrovascular disease; no venous thromboembolic disease. Psychological: Chronic depression; no anxiety or mood changes. Neurological: Positional dizziness and lightheadedness; disequilibrium and imbalance; tingling of the hands intermittently; cerebrovascular and carotid  artery disease; expressive aphasia; no syncope, or near syncopal episodes; tingling in the hands.  No lower extremity paresthesias.  Physical Examination: Vital Signs: Body surface area is 1.85 meters squared.  Vitals:   07/04/18 1033  BP: 105/70  Pulse: 85  Resp: 18  Temp: 98 F (36.7 C)  SpO2: 99%    Filed Weights   07/04/18 1033  Weight: 153 lb 8 oz (69.6 kg)  Body mass index is 22.02 kg/m. ECOG PERFORMANCE STATUS: 1-2 Constitutional:  Mark Foster is fully nourished and developed.  He looks older than his stated age.  He is friendly and cooperative without respiratory compromise at rest. Skin: No rashes, scaling, dryness, jaundice, or itching. HEENT: Head is normocephalic and atraumatic.  Pupils are equal round and reactive to light and accommodation.  Sclerae are anicteric.  Conjunctivae are pink.  No sinus tenderness nor oropharyngeal lesions.  Lips without cracking or peeling; tongue without mass, inflammation, or nodularity.  He has dentures.  Mucous membranes are moist. Neck: Supple and symmetric.  No jugular venous distention or thyromegaly.  Trachea is midline. Lymphatics: No cervical or supraclavicular lymphadenopathy.  No epitrochlear, axillary, or inguinal lymphadenopathy is appreciated. Respiratory/chest: Thorax is symmetrical.  Breath sounds are clear to auscultation and percussion.  Normal excursion and respiratory effort. Back: Symmetric without deformity or tenderness. Cardiovascular: Heart rate and rhythm are regular. Gastrointestinal: Abdomen is flat, soft,, nontender; no organomegaly.  Bowel sounds are normoactive.  No masses are appreciated. Rectal examination: Not performed. Extremities: In the lower extremities, there is no asymmetric swelling, erythema, tenderness, or cord formation.  No clubbing, cyanosis, nor edema. Hematologic: No petechiae, hematomas, or ecchymoses. Psychological:  He is oriented to person, place, and time; normal affect. Neurological:  Right upper extremity weakness: 4/5; right lower extremity weakness: 3/5.  Both sensation and motor function are otherwise intact.  Laboratory Results: June 21, 2018  Ref Range & Units 13d ago  WBC Count 4.0 - 10.5 K/uL 3.1Low    RBC 4.22 - 5.81 MIL/uL 4.22   Hemoglobin 13.0 - 17.0 g/dL 13.7   HCT 39.0 - 52.0 % 39.6   MCV 80.0 - 100.0 fL 93.8   MCH 26.0 - 34.0 pg 32.5   MCHC 30.0 - 36.0 g/dL 34.6   RDW 11.5 - 15.5 % 13.6   Platelet Count 150 - 400 K/uL 106Low    nRBC 0.0 - 0.2 % 0.0   Neutrophils Relative % % 53   Neutro Abs 1.7 - 7.7 K/uL 1.7   Lymphocytes Relative % 41   Lymphs Abs 0.7 - 4.0 K/uL 1.3   Monocytes Relative % 5   Monocytes Absolute 0.1 - 1.0 K/uL 0.1   Eosinophils Relative % 1   Eosinophils Absolute 0.0 - 0.5 K/uL 0.0   Basophils Relative % 0   Basophils Absolute 0.0 - 0.1 K/uL 0.0   Immature Granulocytes % 0   Abs Immature Granulocytes 0.00 - 0.07 K/uL 0.00     Ref Range & Units 13d ago (06/21/18) 72moago (05/20/18) 3471mogo (03/22/18) 8m64moo (03/19/18) 71mo571mo (01/22/18)  Sodium 135 - 145 mmol/L 143  142  141  141  141   Potassium 3.5 - 5.1 mmol/L 3.8  3.8  3.4Low   3.8  3.5  Chloride 98 - 111 mmol/L 108  108  109 CM 107 CM 108 R  CO2 22 - 32 mmol/L _0 Glucose, Bld 70 - 99 mg/dL 105High   67Low   130High  CM 167High  CM 154High  R  BUN 8 - 23 mg/dL _1 CM 20 CM 9 R  Creatinine, Ser 0.61 - 1.24 mg/dL 0.76  0.63  0.61  0.60Low   0.62   Calcium 8.9 - 10.3 mg/dL 9.2  9.2  8.4Low   9.4  8.6Low    Total Protein 6.5 - 8.1 g/dL 7.0  7.2      Albumin 3.5 - 5.0 g/dL 4.0  4.3      AST 15 - 41 U/L 15  14Low       ALT 0 - 44 U/L 21  23      Alkaline Phosphatase 38 - 126 U/L 65  56      Total Bilirubin 0.3 - 1.2 mg/dL 1.0  1.0      GFR calc non Af Amer >60 mL/min >60  >60  >60  >60  >60   GFR calc Af Amer >60 mL/         Ref Range & Units 13d ago  Retic Ct Pct 0.4 - 3.1 % 1.3   RBC. 4.22 - 5.81 MIL/uL 4.22   Retic Count, Absolute 19.0  - 186.0 K/uL 53.2   Immature Retic Fract 2.3 - 15.9 % 8.3   Reticulocyte Hemoglobin >27.9 pg 37.6   Comment:     Given the high negative predictive  value of a RET-He result > 32 pg  iron deficiency is essentially  excluded. If this patient is anemic  other etiologies should be  considered.   Ferritin 176 Copper 297 LDH 989 Folic acid 7.3 Vitamin B12 1514 (05/20/2018) Haptoglobin 99 DAT: Negative Hepatitis C antibody <0.1 Hepatitis B core antibody: Negative Hepatitis B surface antigen: Negative ANA: Negative  Ref Range & Units 13d ago  Anticardiolipin IgG 0 - 14 GPL U/mL <9   Comment: (NOTE)              Negative:       <15              Indeterminate:   15 - 20              Low-Med Positive: >20 - 80              High Positive:     >80   Anticardiolipin IgM 0 - 12 MPL U/mL <9   Comment: (NOTE)              Negative:       <13              Indeterminate:   13 - 20              Low-Med Positive: >20 - 80              High Positive:     >80   Anticardiolipin IgA 0 - 11 APL U/mL <9   Comment: (NOTE)              Negative:       <12              Indeterminate:   12 - 20  Low-Med Positive: >20 - 80              High Positive:     >80     Ref Range & Units 13d ago  Beta-2 Glyco I IgG 0 - 20 GPI IgG units <9   Comment: (NOTE)  The reference interval reflects a 3SD or 99th percentile interval,  which is thought to represent a potentially clinically significant  result in accordance with the International Consensus Statement on  the classification criteria for definitive antiphospholipid  syndrome (APS). J Thromb Haem 2006;4:295-306.   Beta-2-Glycoprotein I IgM 0 - 32 GPI IgM units <9   Comment: (NOTE)  The reference interval reflects a 3SD or 99th percentile interval,   which is thought to represent a potentially clinically significant  result in accordance with the International Consensus Statement on  the classification criteria for definitive antiphospholipid  syndrome (APS). J Thromb Haem 2006;4:295-306.  Performed At: [facility]  [place], [place] [id number]  Rush Farmer MD [id number]   Beta-2-Glycoprotein I IgA 0 - 25 GPI IgA units <9   TSH 0.472 (0.32-4.118) Serum protein electrophoresis/immunofixation: No monoclonal M spike was identified Kappa/lambda light chain assay: Free kappa light chain 20.2 Free lambda light chain 15.6 Free kappa/lambda light chain ratio 1.29 Quantitative immunoglobulins: IgG 914; IgA 164; IgM 61 CRP <0.8 DIC Panel:  Ref Range & Units 13d ago (06/21/18) 13d ago (06/21/18) 49moago (03/22/18) 3277mogo (03/21/18) 77m65moo (03/19/18) 77mo50mo (03/19/18) 77mo 20mo(03/19/18)  Prothrombin Time 11.4 - 15.2 seconds 13.1      13.9    Comment: Performed at [facility] [id number]  1.00      1.08 CM   Comment: Performed at [facility] [place] [id number] 24 - 36 seconds 28    50High  CM   27 CM  Comment: Performed at [facility] [place] 210 - 475 mg/dL 415         Comment: Performed at [facility] [id number], Quant 0.00 - 0.50 ug/mL-FEU 0.43          Summary/Assessment: In the setting of leukopenia/neutropenia and thrombocytopenia over an indeterminate period of time, he presents now for the results of his preliminary laboratory evaluation and recommendations.  Over an indeterminate period of time, he has had leukopenia/neutropenia and thrombocytopenia.  He states he was aware for the past year.  On Jan 22, 2018: A complete blood count showed hemoglobin 13.1  hematocrit 37.0 WBC 2.6; platelets 91,000.  On March 19, 2018: A complete blood count showed hemoglobin 14.0 hematocrit 39.9 WBC 2.0; platelets 103,000.  More recently, on May 01, 2018: A complete blood count showed hemoglobin 13.6 hematocrit 40.1 MCV 95.2 MCH 32.3 RDW 13.3 WBC 2.8 with 43% neutrophils 49% lymphocytes 6% monocytes 1% eosinophils; platelets 139,000.  Ferritin 220 TIBC 271 iron saturation 32% serum iron 88 folic acid 16.9 23.5min B12 >2000.  In 1995 he had a hemicolectomy for diverticulitis.  He has chronic lower back and neck pain along with pain in both ankles.  He reports no rheumatoid or gouty arthritis. He has peripheral neuropathy involving the hands. Following his stroke, he has experienced right upper and lower extremity weakness, expressive aphasia, memory deficit, and generalized weakness. He has no venous thromboembolic disease. He  worked formerly as a Air traffic controller.  He is divorced and lives alone. He began smoking at the age of 51 and stopped smoking at the age of 68.  Over 3 years. From 1992-1994, his alcohol intake was excessive. He now consumes very little alcohol in the form of beer.  At the time of his initial visit, a battery of laboratory studies were obtained to exclude an underlying nutritional deficit, hemolytic process, metabolic anomaly, paraproteinemia, antiphospholipid antibody, chronic disseminated intravascular coagulopathy; thyroid dysfunction; copper deficiency, connective tissue disorder; or infectious/inflammatory etiology.  The results of those laboratory studies were not available at the time of discharge.  Those results are outlined below.   In the interim since his last visit, he denies any new problems or complaints. Both his appetite and weight remain stable. His energy level is fair. He experiences dry skin secondary to diabetes.  He has no rash or itching. He has undergone previously laser surgery for diabetic retinopathy. Although he denies  headache, syncope, near syncopal episodes, he has positional dizziness and lightheadedness.  He has no hearing deficit.  He denies cough, sore throat, or orthopnea.  He has no dyspnea either at rest or on minimal exertion.  He ambulates with the use of a cane. He has no pain or difficulty in swallowing.  No fever, shaking chills, sweats, or flulike symptoms are reported.  He denies chest or abdominal pain.  He reports no nausea, vomiting, diarrhea, or constipation. There is no melena or bright red blood per rectum. He has urinary hesitancy and incomplete bladder emptying.  There is no urinary hematuria, dysuria urgency, or frequency.  He denies swelling of his ankles. He has chronic pain in the lower back, neck, and ankles.  He admits to difficulties in balance and disequilibrium. He denies any bleeding tendency. He has intermittent tingling in his hands.   His other comorbid problems include primary hypertension; dyslipidemia; diabetic peripheral neuropathy; extensive cerebrovascular disease with extensive stenosis of the right internal carotid, proximal right middle cerebral artery, mid basilar artery, and left internal carotid artery at the bulb with severe stenosis of the proximal cavernous segment of the left internal carotid artery and left vertebral artery; previous stroke syndrome in 2010 and again in May, 2019 with placement of a cerebral angioplasty and placement of vascular stents; insulin requiring diabetes mellitus; hypothyroid disease; nephrolithiasis; cervical stenosis and cervical compression fracture; degenerative joint disease involving the thoracolumbar and ankles; and chronic depression without suicidal ideation.    Recommendation/Plan: We reviewed and discussed his laboratory studies from October 18.  They were essentially normal. Because of his easy fatigability and excessive tiredness, in the absence of anemia, it was felt that complete iron studies be obtained.  Although his vitamin B12  level was excessively high, and his folic acid level was low-normal, the metabolites of both were requested to include methylmalonic acid and serum homocysteine.  As discussed with Mark Foster, there was no evidence of a hemolytic process, paraproteinemia, antiphospholipid antibody, chronic disseminated intravascular coagulopathy, thyroid dysfunction, copper deficiency, connective tissue disorder, or infectious/inflammatory etiology. There is no convincing evidence of nutritional deficiency.  If these additional studies fail to reveal any obvious etiology, then CT imaging of the abdomen and pelvis would be considered to rule out splenomegaly.  He is a candidate for metabolic syndrome and consequent nonalcoholic steatohepatitis given his extensive peripheral arterial disease, dyslipidemia, and diabetes mellitus.  Because his white blood cell count, hemoglobin, and platelet count have increased, there is no urgency to obtain a  bone marrow aspiration and biopsy.  The total time spent discussing the results of his previous laboratory evaluation, role and rationale for vitamin V57 and folic acid metabolites and complete iron studies and recommendations was 25 minutes. At least 50% of that time was spent in face-to-face discussion, reviewing the laboratory studies, counseling, and answering questions.  There was ample time allotted to answer all questions.  This note was dictated using voice activated technology/software.  Unfortunately, typographical errors are not uncommon, and transcription is subject to mistakes and regrettably misinterpretation.  If necessary, clarification of the above information can be discussed with me at any time.  FOLLOW UP: AS DIRECTED   cc:     Hurshel Party, MD           Luanne Bras MD           Loni Beckwith MD   Henreitta Leber, MD  Hematology/Oncology [place] [address] [place], [place] [address] Office: [phone] HTVG:  [phone]

## 2018-07-04 NOTE — Patient Instructions (Signed)
We reviewed in detail the results of your laboratory studies from October 18.  There is a suggestion of iron deficiency without iron depletion.  The newer "reticulocyte panel" suggest an iron deficiency.  The results of an extensive work-up were otherwise normal.  Although your folic acid level was low normal, the metabolite of folic acid and vitamin B12 were requested today.  Likewise iron studies have been requested in an effort to explain your easy fatigability and tiredness.  You are not anemic.  We discussed very briefly the possibility of a bone marrow aspiration and biopsy.  With your white blood cell count and platelets increasing since the past 3 months, I would defer that procedure.  Barring any unforeseen complications, your next scheduled doctor visit with laboratory studies is on July 19, 2018.  Please do not hesitate to call if any questions arise in the interim.  Thank you! Ladona Ridgel, MD Hematology/Oncology

## 2018-07-05 LAB — HOMOCYSTEINE: Homocysteine: 9.2 umol/L (ref 0.0–15.0)

## 2018-07-08 LAB — METHYLMALONIC ACID, SERUM: METHYLMALONIC ACID, QUANTITATIVE: 62 nmol/L (ref 0–378)

## 2018-07-16 ENCOUNTER — Other Ambulatory Visit (HOSPITAL_COMMUNITY): Payer: Self-pay | Admitting: Interventional Radiology

## 2018-07-16 ENCOUNTER — Ambulatory Visit (HOSPITAL_COMMUNITY)
Admission: RE | Admit: 2018-07-16 | Discharge: 2018-07-16 | Disposition: A | Discharge status: Home / Self Care | Payer: BLUE CROSS/BLUE SHIELD | Source: Ambulatory Visit | Attending: Interventional Radiology | Admitting: Interventional Radiology

## 2018-07-16 DIAGNOSIS — R2990 Unspecified symptoms and signs involving the nervous system: Secondary | ICD-10-CM

## 2018-07-16 DIAGNOSIS — I6522 Occlusion and stenosis of left carotid artery: Secondary | ICD-10-CM | POA: Insufficient documentation

## 2018-07-16 DIAGNOSIS — I639 Cerebral infarction, unspecified: Secondary | ICD-10-CM | POA: Diagnosis not present

## 2018-07-16 DIAGNOSIS — I700 Atherosclerosis of aorta: Secondary | ICD-10-CM | POA: Diagnosis not present

## 2018-07-16 MED ORDER — IOPAMIDOL (ISOVUE-370) INJECTION 76%
75.0000 mL | Freq: Once | INTRAVENOUS | Status: AC | PRN
  Administered 2018-07-16: 75 mL via INTRAVENOUS

## 2018-07-17 ENCOUNTER — Telehealth (HOSPITAL_COMMUNITY): Payer: Self-pay | Admitting: Radiology

## 2018-07-17 NOTE — Telephone Encounter (Signed)
Called pt, told him per Deveshwar his CTA head/neck did show that the previous stent placed had flow. Should he experience sx again like these he should call 911 and come to the ED immediately. Pt agrees with this plan of care. JM

## 2018-07-19 ENCOUNTER — Inpatient Hospital Stay: Payer: BLUE CROSS/BLUE SHIELD | Attending: Hematology and Oncology

## 2018-07-19 ENCOUNTER — Telehealth: Payer: Self-pay | Admitting: Hematology and Oncology

## 2018-07-19 ENCOUNTER — Inpatient Hospital Stay: Payer: BLUE CROSS/BLUE SHIELD | Admitting: Hematology and Oncology

## 2018-07-19 VITALS — BP 118/71 | HR 85 | Temp 98.4°F | Resp 19 | Ht 70.0 in | Wt 149.9 lb

## 2018-07-19 DIAGNOSIS — K760 Fatty (change of) liver, not elsewhere classified: Secondary | ICD-10-CM | POA: Insufficient documentation

## 2018-07-19 DIAGNOSIS — R339 Retention of urine, unspecified: Secondary | ICD-10-CM | POA: Insufficient documentation

## 2018-07-19 DIAGNOSIS — Z79899 Other long term (current) drug therapy: Secondary | ICD-10-CM | POA: Diagnosis not present

## 2018-07-19 DIAGNOSIS — R3911 Hesitancy of micturition: Secondary | ICD-10-CM | POA: Insufficient documentation

## 2018-07-19 DIAGNOSIS — Z7982 Long term (current) use of aspirin: Secondary | ICD-10-CM | POA: Insufficient documentation

## 2018-07-19 DIAGNOSIS — D72819 Decreased white blood cell count, unspecified: Secondary | ICD-10-CM

## 2018-07-19 DIAGNOSIS — E785 Hyperlipidemia, unspecified: Secondary | ICD-10-CM | POA: Insufficient documentation

## 2018-07-19 DIAGNOSIS — M545 Low back pain: Secondary | ICD-10-CM

## 2018-07-19 DIAGNOSIS — M4802 Spinal stenosis, cervical region: Secondary | ICD-10-CM

## 2018-07-19 DIAGNOSIS — Z87891 Personal history of nicotine dependence: Secondary | ICD-10-CM | POA: Insufficient documentation

## 2018-07-19 DIAGNOSIS — M542 Cervicalgia: Secondary | ICD-10-CM | POA: Insufficient documentation

## 2018-07-19 DIAGNOSIS — G8929 Other chronic pain: Secondary | ICD-10-CM

## 2018-07-19 DIAGNOSIS — R55 Syncope and collapse: Secondary | ICD-10-CM | POA: Diagnosis not present

## 2018-07-19 DIAGNOSIS — E039 Hypothyroidism, unspecified: Secondary | ICD-10-CM | POA: Insufficient documentation

## 2018-07-19 DIAGNOSIS — I10 Essential (primary) hypertension: Secondary | ICD-10-CM | POA: Diagnosis not present

## 2018-07-19 DIAGNOSIS — Z794 Long term (current) use of insulin: Secondary | ICD-10-CM

## 2018-07-19 DIAGNOSIS — I679 Cerebrovascular disease, unspecified: Secondary | ICD-10-CM | POA: Insufficient documentation

## 2018-07-19 DIAGNOSIS — E1142 Type 2 diabetes mellitus with diabetic polyneuropathy: Secondary | ICD-10-CM

## 2018-07-19 DIAGNOSIS — I69351 Hemiplegia and hemiparesis following cerebral infarction affecting right dominant side: Secondary | ICD-10-CM | POA: Insufficient documentation

## 2018-07-19 DIAGNOSIS — D696 Thrombocytopenia, unspecified: Secondary | ICD-10-CM

## 2018-07-19 LAB — CBC WITH DIFFERENTIAL (CANCER CENTER ONLY)
Abs Immature Granulocytes: 0 10*3/uL (ref 0.00–0.07)
BASOS PCT: 0 %
Basophils Absolute: 0 10*3/uL (ref 0.0–0.1)
Eosinophils Absolute: 0.1 10*3/uL (ref 0.0–0.5)
Eosinophils Relative: 2 %
HCT: 38.1 % — ABNORMAL LOW (ref 39.0–52.0)
Hemoglobin: 13.4 g/dL (ref 13.0–17.0)
IMMATURE GRANULOCYTES: 0 %
Lymphocytes Relative: 36 %
Lymphs Abs: 1.2 10*3/uL (ref 0.7–4.0)
MCH: 32.6 pg (ref 26.0–34.0)
MCHC: 35.2 g/dL (ref 30.0–36.0)
MCV: 92.7 fL (ref 80.0–100.0)
MONOS PCT: 4 %
Monocytes Absolute: 0.1 10*3/uL (ref 0.1–1.0)
NEUTROS PCT: 58 %
Neutro Abs: 1.9 10*3/uL (ref 1.7–7.7)
Platelet Count: 117 10*3/uL — ABNORMAL LOW (ref 150–400)
RBC: 4.11 MIL/uL — AB (ref 4.22–5.81)
RDW: 13.6 % (ref 11.5–15.5)
WBC: 3.3 10*3/uL — AB (ref 4.0–10.5)
nRBC: 0 % (ref 0.0–0.2)

## 2018-07-19 NOTE — Telephone Encounter (Signed)
Gave pt avs and calendar  °

## 2018-07-19 NOTE — Patient Instructions (Addendum)
We discussed in detail the results of your lab work.  Although the white blood cell count and platelets are increasing, CT imaging of the abdomen and pelvis with intravenous contrast has been requested.  They will call you and schedule a time.    Barring any unforeseen complications, your next scheduled doctor visit with laboratory studies is on December 6.  Please do not hesitate to call should any new or untoward problems arise.  Thank you! Ladona Ridgel, MD  Happy happy Thanksgiving!

## 2018-07-20 ENCOUNTER — Encounter: Payer: Self-pay | Admitting: Hematology and Oncology

## 2018-07-20 NOTE — Progress Notes (Signed)
Hematology/Oncology Outpatient Progress Note  Patient Name:  Mark Foster  DOB: 1956-06-19   Date of Service: July 19, 2018  Referring Provider: Hurshel Party, MD [address] [place], [place] [address]   Consulting Physician: Henreitta Leber, MD Hematology/Oncology  Reason for Visit: In the setting of leukopenia/neutropenia and thrombocytopenia over an indeterminate period of time, he presents now for the results of laboratory evaluation and recommendations.  Brief History: Mark Foster is a 62 year old resident of [place] whose past medical history is significant for primary hypertension; dyslipidemia; diabetic peripheral neuropathy; extensive cerebrovascular disease with extensive stenosis of the right internal carotid, proximal right middle cerebral artery, mid basilar artery, and left internal carotid artery at the bulb with severe stenosis of the proximal cavernous segment of the left internal carotid artery and left vertebral artery; previous stroke syndrome in 2010 and again in May, 2019 with placement of a cerebral angioplasty and placement of vascular stents; insulin requiring diabetes mellitus; hypothyroid disease; nephrolithiasis; cervical stenosis and cervical compression fracture; degenerative joint disease involving the thoracolumbar and ankles; and chronic depression without suicidal ideation.    His primary care physician is Dr. Hurshel Party.  He is also followed by Dr. Luanne Bras, interventional vascular radiology. He is alone at this visit. Over an indeterminate period of time, he has had leukopenia/neutropenia and thrombocytopenia.  He states he was aware for the past year.  On Jan 22, 2018: A complete blood count showed hemoglobin 13.1 hematocrit 37.0 WBC 2.6; platelets 91,000.  On March 19, 2018: A complete blood count showed hemoglobin 14.0 hematocrit 39.9 WBC 2.0; platelets 103,000.  More recently, on May 01, 2018: A complete blood count showed  hemoglobin 13.6 hematocrit 40.1 MCV 95.2 MCH 32.3 RDW 13.3 WBC 2.8 with 43% neutrophils 49% lymphocytes 6% monocytes 1% eosinophils; platelets 139,000.  Ferritin 220 TIBC 271 iron saturation 32% serum iron 88 folic acid 43.1 vitamin B12 >2000.  In 1995 he had a hemicolectomy for diverticulitis.  He has chronic lower back and neck pain along with pain in both ankles.  He reports no rheumatoid or gouty arthritis. He has peripheral neuropathy involving the hands. Following his stroke, he has experienced right upper and lower extremity weakness, expressive aphasia, memory deficit, and generalized weakness. He has no venous thromboembolic disease. He worked formerly as a Air traffic controller.  He is divorced and lives alone. He began smoking at the age of 65 and stopped smoking at the age of 62.  Over 3 years. From 1992-1994, his alcohol intake was excessive. He now consumes very little alcohol in the form of beer.  At the time of his initial visit, a battery of laboratory studies were obtained to exclude an underlying nutritional deficit, hemolytic process, metabolic anomaly, paraproteinemia, antiphospholipid antibody, chronic disseminated intravascular coagulopathy; thyroid dysfunction; copper deficiency, connective tissue disorder; or infectious/inflammatory etiology.Those results are outlined below. They were essentially nondiagnostic. He had repeat laboratory studies done today.  As discussed with [name], there was no evidence of a hemolytic process, paraproteinemia, antiphospholipid antibody, chronic disseminated intravascular coagulopathy, thyroid dysfunction, copper deficiency, connective tissue disorder, or infectious/inflammatory etiology. There is no convincing evidence of nutritional deficiency. Because of his easy fatigability and excessive tiredness, in the absence of anemia, it was felt that complete iron studies be obtained.    Although his vitamin B12 level was excessively high, and his folic  acid level was low-normal, the metabolites of both were requested to include methylmalonic acid and serum homocysteine. Those results are outlined below. It is  with this background he presents now for further discussion and recommendations in the setting of improving leukopenia/neutropenia and thrombocytopenia as described above.  Interval History: In the interim since his last visit, he had a near syncopal episode several days ago. He did not seek medical attention immediately. When he called Dr. Estanislado Pandy, he was "scolded" and told to go to the emergency department if that episode occurs again. Both his appetite and weight remain stable. His energy level is low. He experiences dry skin secondary to diabetes. He has no rash or itching. He has undergone previously laser surgery for diabetic retinopathy. Although he denies headache, syncope, near syncopal episodes he has positional dizziness and lightheadedness.  He has no hearing deficit. He denies cough, sore throat, or orthopnea.  He has no dyspnea either at rest or on minimal exertion. He ambulates with the use of a cane. He has no pain or difficulty in swallowing.  No fever, shaking chills, sweats, or flulike symptoms are reported. He denies chest or abdominal pain. He reports no nausea, vomiting, diarrhea, or constipation. There is no melena or bright red blood per rectum. He has urinary hesitancy and incomplete bladder emptying. There is no urinary hematuria, dysuria urgency, or frequency. He denies swelling of his ankles. He has chronic pain in the lower back, neck, and ankles. He admits to difficulties in balance and disequilibrium. He denies any bleeding tendency. He has chronic numbness and tingling in his fingers and pain on the instep of both feet.   Past Medical History:  Diagnosis Date  . Cervical compression fracture ([facility])   . Cervical spinal stenosis   . Diabetic neuropathy ([facility])    Bilateral legs  . Diverticulitis   . History of  kidney stones   . Hypertension    No current medications  . Hypothyroidism   . Stenosis of left vertebral artery   . Stroke [facility])    2011  . Type 2 diabetes mellitus [facility])    Past Medical History Reviewed        Family History Reviewed       Social History Reviewed  Allergies  Allergen Reactions  . Demerol [Meperidine Hcl] Other (See Comments)    convulsions   Current Outpatient Medications on File Prior to Visit  Medication Sig  . aspirin 81 MG tablet Take 1 tablet (81 mg total) by mouth daily.  Marland Kitchen atorvastatin (LIPITOR) 80 MG tablet Take 1 tablet (80 mg total) by mouth daily at 6 PM.  . Continuous Blood Gluc Sensor (FREESTYLE LIBRE 14 DAY SENSOR) MISC 1 Device by Does not apply route every 14 (fourteen) days.  Marland Kitchen ECHINACEA EXTRACT PO Take 760 mg by mouth daily.  Marland Kitchen gabapentin (NEURONTIN) 300 MG capsule Take 1 capsule (300 mg total) by mouth at bedtime.  . insulin glargine (LANTUS) 100 unit/mL SOPN Inject 0.1 mLs (10 Units total) into the skin at bedtime. (Patient taking differently: Inject 20 Units into the skin every morning. )  . Insulin Pen Needle 32G X 4 MM MISC 1 Device by Does not apply route daily.  . NP THYROID 30 MG tablet Take 30 mg by mouth daily before breakfast.   . sertraline (ZOLOFT) 50 MG tablet Take 50 mg by mouth every morning.   . ticagrelor (BRILINTA) 90 MG TABS tablet Take 90 mg by mouth 2 (two) times daily.   No current facility-administered medications on file prior to visit.    Review of Systems: Constitutional: No fever, sweats, or shaking chills.  No  appetite or weight deficit; energy level fair. Skin: No rash, scaling, sores, lumps, or jaundice. HEENT: Diabetic retinopathy.  No hearing deficit; no allergic rhinitis or sinusitis. Pulmonary: No unusual cough, sore throat, or orthopnea.  No DOE. Cardiovascular: No coronary artery disease, angina, or myocardial infarction.  No cardiac dysrhythmia.  Primary essential hypertension and  dyslipidemia. Gastrointestinal: No indigestion, dysphagia, abdominal pain, diarrhea, or constipation.  No change in bowel habits. Genitourinary: No urinary frequency, urgency, hematuria, or dysuria; urinary retention; hesitancy. Musculoskeletal: Arthralgias involving the neck, lower back, and ankles.  No myalgias; no joint swelling or instability. Hematologic: No bleeding tendency; easy bruisability; leukopenia/neutropenia; thrombocytopenia. Endocrine: No intolerance to heat or cold; hypothyroid disease and insulin requiring diabetes mellitus. Vascular: Extensive peripheral arterial/carotid/cerebrovascular disease; no venous thromboembolic disease. Psychological: Chronic depression; no anxiety or mood changes. Neurological: Positional dizziness and lightheadedness; disequilibrium and imbalance; recent fall and near syncope without loss of consciousness; numbness of the fingers and pain in the instep of both feet; cerebrovascular and carotid artery disease; expressive aphasia; no syncope, or near syncopal episodes.  Physical Examination: Vital Signs: Body surface area is 1.83 meters squared.  Vitals:   07/19/18 1441  BP: 118/71  Pulse: 85  Resp: 19  Temp: 98.4 F (36.9 C)  SpO2: 99%    Filed Weights   07/19/18 1441  Weight: 149 lb 14.4 oz (68 kg)  Body mass index is 21.51 kg/m. ECOG PERFORMANCE STATUS: 1-2 Constitutional:  Mark Foster is fully nourished and developed.  He looks older than his stated age.  He is friendly and cooperative without respiratory compromise at rest. Skin: No rashes, scaling, dryness, jaundice, or itching. HEENT: Head is normocephalic and atraumatic.  Pupils are equal round and reactive to light and accommodation.  Sclerae are anicteric.  Conjunctivae are pink.  No sinus tenderness nor oropharyngeal lesions.  Lips without cracking or peeling; tongue without mass, inflammation, or nodularity.  He has dentures.  Mucous membranes are moist. Neck: Supple and  symmetric.  No jugular venous distention or thyromegaly.  Trachea is midline. Lymphatics: No cervical or supraclavicular lymphadenopathy.  No epitrochlear, axillary, or inguinal lymphadenopathy is appreciated. Respiratory/chest: Thorax is symmetrical.  Breath sounds are clear to auscultation and percussion.  Normal excursion and respiratory effort. Back: Symmetric without deformity or tenderness. Cardiovascular: Heart rate and rhythm are regular. Gastrointestinal: Abdomen is flat, soft,, nontender; no organomegaly.  Bowel sounds are normoactive.  No masses are appreciated. Rectal examination: Not performed. Extremities: In the lower extremities, there is no asymmetric swelling, erythema, tenderness, or cord formation.  No clubbing, cyanosis, nor edema. Hematologic: No petechiae, hematomas, or ecchymoses. Psychological:  He is oriented to person, place, and time; normal affect. Neurological: Right upper extremity weakness: 4/5; right lower extremity weakness: 3/5.  Both sensation and motor function are otherwise intact.  Laboratory Results:  Ref Range & Units 1d ago (07/19/18) 4wk ago (06/21/18) 4wk ago (06/21/18) 52moago (03/22/18) 457mogo (03/19/18)  WBC Count 4.0 - 10.5 K/uL 3.3Low    3.1Low   2.0Low   2.0Low    RBC 4.22 - 5.81 MIL/uL 4.11Low    4.22  3.60Low   4.31   Hemoglobin 13.0 - 17.0 g/dL 13.4   13.7  11.5Low   14.0   HCT 39.0 - 52.0 % 38.1Low    39.6  33.5Low   39.9   MCV 80.0 - 100.0 fL 92.7   93.8  93.1 R 92.6 R  MCH 26.0 - 34.0 pg 32.6   32.5  31.9  32.5   MCHC 30.0 - 36.0 g/dL 35.2   34.6  34.3  35.1   RDW 11.5 - 15.5 % 13.6   13.6  13.6  13.5   Platelet Count 150 - 400 K/uL 117Low   109Low   106Low   80Low  CM 103Low  CM  nRBC 0.0 - 0.2 % 0.0   0.0     Neutrophils Relative % % 58   53  37  51   Neutro Abs 1.7 - 7.7 K/uL 1.9   1.7  0.7Low   1.0Low    Lymphocytes Relative % 36   41  54  44   Lymphs Abs 0.7 - 4.0 K/uL 1.2   1.3  1.2  0.9   Monocytes Relative % _0 Monocytes Absolute 0.1 - 1.0 K/uL 0.1   0.1  0.1  0.1   Eosinophils Relative % _1 Eosinophils Absolute 0.0 - 0.5 K/uL 0.1   0.0  0.0 R 0.0 R  Basophils Relative % 0   0  1  0   Basophils Absolute 0.0 - 0.1 K/uL 0.0   0.0  0.0  0.0   Immature Granulocytes % 0   0     Abs Immature Granulocytes 0.00 - 0.07 K/uL 0.00   0.00 CM     June 21, 2018 Ferritin 191 Iron/TIBC 67/264 Iron saturation 25% Methylmalonic acid 62 Homocysteine 9.2  Ref Range & Units 13d ago (06/21/18) 30moago (05/20/18) 3657mogo (03/22/18) 57m534moo (03/19/18) 34mo39mo (01/22/18)  Sodium 135 - 145 mmol/L 143  142  141  141  141   Potassium 3.5 - 5.1 mmol/L 3.8  3.8  3.4Low   3.8  3.5   Chloride 98 - 111 mmol/L 108  108  109 CM 107 CM 108 R  CO2 22 - 32 mmol/L _2 Glucose, Bld 70 - 99 mg/dL 105High   67Low   130High  CM 167High  CM 154High  R  BUN 8 - 23 mg/dL _3 CM 20 CM 9 R  Creatinine, Ser 0.61 - 1.24 mg/dL 0.76  0.63  0.61  0.60Low   0.62   Calcium 8.9 - 10.3 mg/dL 9.2  9.2  8.4Low   9.4  8.6Low    Total Protein 6.5 - 8.1 g/dL 7.0  7.2      Albumin 3.5 - 5.0 g/dL 4.0  4.3      AST 15 - 41 U/L 15  14Low       ALT 0 - 44 U/L 21  23      Alkaline Phosphatase 38 - 126 U/L 65  56      Total Bilirubin 0.3 - 1.2 mg/dL 1.0  1.0      GFR calc non Af Amer >60 mL/min >60  >60  >60  >60  >60   GFR calc Af Amer >60 mL/         Ref Range & Units 13d ago  Retic Ct Pct 0.4 - 3.1 % 1.3   RBC. 4.22 - 5.81 MIL/uL 4.22   Retic Count, Absolute 19.0 - 186.0 K/uL 53.2   Immature Retic Fract 2.3 - 15.9 % 8.3   Reticulocyte Hemoglobin >27.9 pg 37.6   Comment:     Given the high negative predictive  value of a RET-He  result > 32 pg  iron deficiency is essentially  excluded. If this patient is anemic  other etiologies should be  considered.   Ferritin 176 Copper 161 LDH 096 Folic acid 7.3 Vitamin B12 1514 (05/20/2018) Haptoglobin 99 DAT: Negative Hepatitis C antibody <0.1 Hepatitis B  core antibody: Negative Hepatitis B surface antigen: Negative ANA: Negative  Ref Range & Units 13d ago  Anticardiolipin IgG 0 - 14 GPL U/mL <9   Comment: (NOTE)              Negative:       <15              Indeterminate:   15 - 20              Low-Med Positive: >20 - 80              High Positive:     >80   Anticardiolipin IgM 0 - 12 MPL U/mL <9   Comment: (NOTE)              Negative:       <13              Indeterminate:   13 - 20              Low-Med Positive: >20 - 80              High Positive:     >80   Anticardiolipin IgA 0 - 11 APL U/mL <9   Comment: (NOTE)              Negative:       <12              Indeterminate:   12 - 20              Low-Med Positive: >20 - 80              High Positive:     >80     Ref Range & Units 13d ago  Beta-2 Glyco I IgG 0 - 20 GPI IgG units <9   Comment: (NOTE)  The reference interval reflects a 3SD or 99th percentile interval,  which is thought to represent a potentially clinically significant  result in accordance with the International Consensus Statement on  the classification criteria for definitive antiphospholipid  syndrome (APS). J Thromb Haem 2006;4:295-306.   Beta-2-Glycoprotein I IgM 0 - 32 GPI IgM units <9   Comment: (NOTE)  The reference interval reflects a 3SD or 99th percentile interval,  which is thought to represent a potentially clinically significant  result in accordance with the International Consensus Statement on  the classification criteria for definitive antiphospholipid  syndrome (APS). J Thromb Haem 2006;4:295-306.  Performed At: [facility]  [place], [place] [id number]  Rush Farmer MD BJ:[phone]   Beta-2-Glycoprotein I IgA 0 - 25 GPI IgA units <9   TSH 0.472 (0.32-4.118) Serum protein  electrophoresis/immunofixation: No monoclonal M spike was identified Kappa/lambda light chain assay: Free kappa light chain 20.2 Free lambda light chain 15.6 Free kappa/lambda light chain ratio 1.29 Quantitative immunoglobulins: IgG 914; IgA 164; IgM 61 CRP <0.8 DIC Panel:  Ref Range & Units 13d ago (06/21/18) 13d ago (06/21/18) 29moago (03/22/18) 314mogo (03/21/18) 2m79moo (03/19/18) 2mo32mo (03/19/18) 2mo 22mo(03/19/18)  Prothrombin Time 11.4 - 15.2 seconds 13.1      13.9    Comment: Performed at [facility]  [place] [address], [place], [place] [address]  INR  1.00      1.08 CM   Comment: Performed at [facility], [place] [address] [place], [place] [address]  aPTT 24 - 36 seconds 28    50High  CM   27 CM  Comment: Performed at [facility], [place] [address], [place], [place] [address]  Fibrinogen 210 - 475 mg/dL 415         Comment: Performed at [facility], [place] [address] [place], [place] [address]  D-Dimer, Quant 0.00 - 0.50 ug/mL-FEU 0.43          Summary/Assessment: In the setting of leukopenia/neutropenia and thrombocytopenia over an indeterminate period of time, he presents now for discussion, the results of laboratory evaluation, and recommendations.  Over an indeterminate period of time, he has had leukopenia/neutropenia and thrombocytopenia.  He states he was aware for the past year. On Jan 22, 2018: A complete blood count showed hemoglobin 13.1 hematocrit 37.0 WBC 2.6; platelets 91,000.  On March 19, 2018: A complete blood count showed hemoglobin 14.0 hematocrit 39.9 WBC 2.0; platelets 103,000.  More recently, on May 01, 2018: A complete blood count showed hemoglobin 13.6 hematocrit 40.1 MCV 95.2 MCH 32.3 RDW 13.3 WBC 2.8 with 43% neutrophils 49% lymphocytes 6% monocytes 1% eosinophils; platelets 139,000.  Ferritin 220 TIBC 271 iron saturation 32% serum iron 88 folic acid 29.5 vitamin B12 >2000.  In  1995 he had a hemicolectomy for diverticulitis.  He has chronic lower back and neck pain along with pain in both ankles.  He reports no rheumatoid or gouty arthritis. He has peripheral neuropathy involving the hands. Following his stroke, he has experienced right upper and lower extremity weakness, expressive aphasia, memory deficit, and generalized weakness. He has no venous thromboembolic disease. He worked formerly as a Air traffic controller.  He is divorced and lives alone. He began smoking at the age of 32 and stopped smoking at the age of 36.  Over 3 years. From 1992-1994, his alcohol intake was excessive. He now consumes very little alcohol in the form of beer.  At the time of his initial visit, a battery of laboratory studies were obtained to exclude an underlying nutritional deficit, hemolytic process, metabolic anomaly, paraproteinemia, antiphospholipid antibody, chronic disseminated intravascular coagulopathy; thyroid dysfunction; copper deficiency, connective tissue disorder; or infectious/inflammatory etiology.Those results are outlined below. They were essentially nondiagnostic. He had repeat laboratory studies done today.  As discussed with [name], there was no evidence of a hemolytic process, paraproteinemia, antiphospholipid antibody, chronic disseminated intravascular coagulopathy, thyroid dysfunction, copper deficiency, connective tissue disorder, or infectious/inflammatory etiology. There is no convincing evidence of nutritional deficiency. Because of his easy fatigability and excessive tiredness, in the absence of anemia, it was felt that complete iron studies be obtained. Although his vitamin B12 level was excessively high, and his folic acid level was low-normal, the metabolites of both were requested to include methylmalonic acid and serum homocysteine. Those results are outlined above.  In the interim since his last visit, he had a near syncopal episode several days ago.  Not seek  medical attention immediately.  When he called Dr. Estanislado Pandy, he was "scolded" and told to go to the emergency department if that episode occurs again. Both his appetite and weight remain stable. His energy level is low. He experiences dry skin secondary to diabetes. He has no rash or itching. He has undergone previously laser surgery for diabetic retinopathy. Although he denies headache, syncope,  near syncopal episodes he has positional dizziness and lightheadedness.  He has no hearing deficit. He denies cough, sore throat, or orthopnea.  He has no dyspnea either at rest or on minimal exertion. He ambulates with the use of a cane. He has no pain or difficulty in swallowing.  No fever, shaking chills, sweats, or flulike symptoms are reported.  He denies chest or abdominal pain. He reports no nausea, vomiting, diarrhea, or constipation. There is no melena or bright red blood per rectum. He has urinary hesitancy and incomplete bladder emptying. There is no urinary hematuria, dysuria urgency, or frequency. He denies swelling of his ankles. He has chronic pain in the lower back, neck, and ankles. He admits to difficulties in balance and disequilibrium. He denies any bleeding tendency. He has chronic numbness and tingling in his fingers and pain on the instep of both feet.   His other comorbid problems include primary hypertension; dyslipidemia; diabetic peripheral neuropathy; extensive cerebrovascular disease with extensive stenosis of the right internal carotid, proximal right middle cerebral artery, mid basilar artery, and left internal carotid artery at the bulb with severe stenosis of the proximal cavernous segment of the left internal carotid artery and left vertebral artery; previous stroke syndrome in 2010 and again in May, 2019 with placement of a cerebral angioplasty and placement of vascular stents; insulin requiring diabetes mellitus; hypothyroid disease; nephrolithiasis; cervical stenosis and cervical  compression fracture; degenerative joint disease involving the thoracolumbar and ankles; and chronic depression without suicidal ideation.    Recommendation/Plan: The results of his laboratory studies from today were discussed in detail.  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  There is no convincing evidence of iron, vitamin F79 or folic acid deficiency.  We discussed the role, rationale, and mechanics of a bone marrow aspiration and biopsy.  Before pursuing that procedure, CT imaging of the abdomen and pelvis with intravenous contrast was requested to exclude occult cirrhosis/hypersplenism to account for his cytopenias. Because his white blood cell count and platelet count have increased without treatment, there is no urgency to obtain a bone marrow aspiration and biopsy.  If there is no evidence of organomegaly or portal hypertension, then a bone marrow aspiration biopsy will be recommended. Questions were answered to his satisfaction.  Barring any unforeseen complications, his next scheduled doctor visit with laboratory studies is on December 6 to discuss the results of his CT imaging and lab work.  He was advised to call us in the interim should any new or untoward problems arise.  The total time spent discussing the results of his laboratory studies, role and rationale for CT imaging in the setting of leukopenia and thrombocytopenia of unclear etiology, the rationale for a bone marrow aspiration and biopsy should there be no evidence of hypersplenism on CT imaging and recommendations was 25 minutes. At least 50% of that time was spent in face-to-face discussion, reviewing the laboratory studies, counseling, and answering questions.  There was ample time allotted to answer all questions.  This note was dictated using voice activated technology/software.  Unfortunately, typographical errors are not uncommon, and transcription is subject to mistakes and regrettably  misinterpretation.  If necessary, clarification of the above information can be discussed with me at any time.  FOLLOW UP: AS DIRECTED   cc:     Hurshel Party, MD            Luanne Bras MD            Loni Beckwith  MD   Henreitta Leber, MD  Hematology/Oncology [place] [address] [place], [place] [address] Office: [phone] WPVX: [phone]

## 2018-07-24 ENCOUNTER — Ambulatory Visit: Payer: BLUE CROSS/BLUE SHIELD | Admitting: "Endocrinology

## 2018-07-24 ENCOUNTER — Encounter: Payer: Self-pay | Admitting: "Endocrinology

## 2018-07-24 VITALS — BP 138/79 | HR 96 | Ht 70.0 in | Wt 154.0 lb

## 2018-07-24 DIAGNOSIS — E1165 Type 2 diabetes mellitus with hyperglycemia: Secondary | ICD-10-CM | POA: Diagnosis not present

## 2018-07-24 DIAGNOSIS — E782 Mixed hyperlipidemia: Secondary | ICD-10-CM

## 2018-07-24 DIAGNOSIS — I10 Essential (primary) hypertension: Secondary | ICD-10-CM | POA: Diagnosis not present

## 2018-07-24 DIAGNOSIS — E039 Hypothyroidism, unspecified: Secondary | ICD-10-CM | POA: Diagnosis not present

## 2018-07-24 NOTE — Progress Notes (Signed)
Endocrinology Consult Note       07/24/2018, 12:36 PM   Subjective:    Patient ID: Mark Foster, male    DOB: [birth year].  Mark Foster is being seen in follow-up for management of currently uncontrolled symptomatic pancreatic diabetes, hypothyroidism, hyperlipidemia. PMD:  Mark Albee, MD.   Past Medical History:  Diagnosis Date  . Cervical compression fracture ([place])   . Cervical spinal stenosis   . Diabetic neuropathy ([facility])    Bilateral legs  . Diverticulitis   . History of kidney stones   . Hypertension    No current medications  . Hypothyroidism   . Stenosis of left vertebral artery   . Stroke [facility])    2011  . Type 2 diabetes mellitus ([place])    Past Surgical History:  Procedure Laterality Date  . APPENDECTOMY    . COLON RESECTION     For diverticulitis  . COLON SURGERY    . IR ANGIO INTRA EXTRACRAN SEL COM CAROTID INNOMINATE BILAT MOD SED  01/21/2018  . IR ANGIO INTRA EXTRACRAN SEL COM CAROTID INNOMINATE UNI R MOD SED  03/21/2018  . IR ANGIO VERTEBRAL SEL VERTEBRAL BILAT MOD SED  01/21/2018  . IR TRANSCATH EXCRAN VERT OR CAR A STENT  03/21/2018  . RADIOLOGY WITH ANESTHESIA N/A 03/21/2018   Procedure: IR WITH ANESTHESIA WITH STENT PLACEMENT;  Surgeon: Luanne Bras, MD;  Location: [place];  Service: Radiology;  Laterality: N/A;  . ROTATOR CUFF REPAIR     Left   Social History   Socioeconomic History  . Marital status: Single    Spouse name: Not on file  . Number of children: Not on file  . Years of education: Not on file  . Highest education level: Not on file  Occupational History  . Not on file  Social Needs  . Financial resource strain: Not on file  . Food insecurity:    Worry: Not on file    Inability: Not on file  . Transportation needs:    Medical: Not on file    Non-medical: Not on file  Tobacco Use  . Smoking status: Never Smoker  . Smokeless tobacco: Never Used   Substance and Sexual Activity  . Alcohol use: Not Currently  . Drug use: Never  . Sexual activity: Not on file  Lifestyle  . Physical activity:    Days per week: Not on file    Minutes per session: Not on file  . Stress: Not on file  Relationships  . Social connections:    Talks on phone: Not on file    Gets together: Not on file    Attends religious service: Not on file    Active member of club or organization: Not on file    Attends meetings of clubs or organizations: Not on file    Relationship status: Not on file  Other Topics Concern  . Not on file  Social History Narrative  . Not on file   Outpatient Encounter Medications as of 07/24/2018  Medication Sig  . aspirin 81 MG tablet Take 1 tablet (81 mg total) by mouth daily.  Marland Kitchen atorvastatin (LIPITOR)  80 MG tablet Take 1 tablet (80 mg total) by mouth daily at 6 PM.  . Continuous Blood Gluc Sensor (FREESTYLE LIBRE 14 DAY SENSOR) MISC 1 Device by Does not apply route every 14 (fourteen) days.  Marland Kitchen ECHINACEA EXTRACT PO Take 760 mg by mouth daily.  Marland Kitchen gabapentin (NEURONTIN) 300 MG capsule Take 1 capsule (300 mg total) by mouth at bedtime.  . insulin glargine (LANTUS) 100 unit/mL SOPN Inject 0.1 mLs (10 Units total) into the skin at bedtime. (Patient taking differently: Inject 20 Units into the skin every morning. )  . Insulin Pen Needle 32G X 4 MM MISC 1 Device by Does not apply route daily.  . NP THYROID 30 MG tablet Take 30 mg by mouth daily before breakfast.   . sertraline (ZOLOFT) 50 MG tablet Take 50 mg by mouth every morning.   . ticagrelor (BRILINTA) 90 MG TABS tablet Take 90 mg by mouth 2 (two) times daily.   No facility-administered encounter medications on file as of 07/24/2018.     ALLERGIES: Allergies  Allergen Reactions  . Demerol [Meperidine Hcl] Other (See Comments)    convulsions    VACCINATION STATUS:  There is no immunization history on file for this patient.  Diabetes  He presents for his initial  diabetic visit. Onset time: He was diagnosed at approximate age of 23 years. His disease course has been improving (He did have A1c of 9.7% in May 2019.  However, more recently he has documented controlled glycemic profile averaging 134 over the last 2 weeks.). There are no hypoglycemic associated symptoms. Pertinent negatives for hypoglycemia include no confusion, headaches, pallor or seizures. There are no diabetic associated symptoms. Pertinent negatives for diabetes include no chest pain, no fatigue, no polydipsia, no polyphagia, no polyuria and no weakness. There are no hypoglycemic complications. Symptoms are improving. Diabetic complications include a CVA. Pertinent negatives for diabetic complications include no heart disease. (Awaiting stent placement in his carotid artery.) Risk factors for coronary artery disease include diabetes mellitus, dyslipidemia, hypertension, male sex and sedentary lifestyle. Current diabetic treatment includes insulin injections. His weight is decreasing steadily. He is following a generally unhealthy diet. When asked about meal planning, he reported none. He has not had a previous visit with a dietitian. He rarely participates in exercise. (His CGM glucose patterns summary shows 90% time in range, 9% above range.  Average blood glucose 134 for the last 2 weeks.) An ACE inhibitor/angiotensin II receptor blocker is not being taken.  Hyperlipidemia  This is a chronic problem. The current episode started more than 1 year ago. The problem is uncontrolled. Exacerbating diseases include diabetes. Pertinent negatives include no chest pain, myalgias or shortness of breath. Current antihyperlipidemic treatment includes statins. Risk factors for coronary artery disease include diabetes mellitus, dyslipidemia, hypertension, male sex and a sedentary lifestyle.    Review of Systems  Constitutional: Negative for chills, fatigue, fever and unexpected weight change.  HENT: Negative for  dental problem, mouth sores and trouble swallowing.   Eyes: Negative for visual disturbance.  Respiratory: Negative for cough, choking, chest tightness, shortness of breath and wheezing.   Cardiovascular: Negative for chest pain, palpitations and leg swelling.  Gastrointestinal: Negative for abdominal distention, abdominal pain, constipation, diarrhea, nausea and vomiting.  Endocrine: Negative for polydipsia, polyphagia and polyuria.  Genitourinary: Negative for dysuria, flank pain, hematuria and urgency.  Musculoskeletal: Negative for back pain, gait problem, myalgias and neck pain.  Skin: Negative for pallor, rash and wound.  Neurological: Negative for seizures,  syncope, weakness, numbness and headaches.  Psychiatric/Behavioral: Negative.  Negative for confusion and dysphoric mood.    Objective:    BP 138/79   Pulse 96   Ht 5\' 10"  (1.778 m)   Wt 154 lb (69.9 kg)   BMI 22.10 kg/m   Wt Readings from Last 3 Encounters:  07/24/18 154 lb (69.9 kg)  07/19/18 149 lb 14.4 oz (68 kg)  07/04/18 153 lb 8 oz (69.6 kg)     Physical Exam  Constitutional: He is oriented to person, place, and time. He appears well-developed. He is cooperative. No distress.  Thin build.  HENT:  Head: Normocephalic and atraumatic.  Eyes: EOM are normal.  Neck: Normal range of motion. Neck supple. No tracheal deviation present. No thyromegaly present.  Cardiovascular: Normal rate, S1 normal and S2 normal. Exam reveals no gallop.  No murmur heard. Pulses:      Dorsalis pedis pulses are 1+ on the right side, and 1+ on the left side.       Posterior tibial pulses are 1+ on the right side, and 1+ on the left side.  Pulmonary/Chest: Effort normal. No respiratory distress. He has no wheezes.  Abdominal: Soft. Bowel sounds are normal. He exhibits no distension. There is no tenderness. There is no guarding and no CVA tenderness.  Musculoskeletal: He exhibits no edema.       Right shoulder: He exhibits no swelling  and no deformity.  Neurological: He is alert and oriented to person, place, and time. He has normal strength and normal reflexes. No cranial nerve deficit or sensory deficit. Gait normal.  Skin: Skin is warm and dry. No rash noted. No cyanosis. Nails show no clubbing.  Psychiatric: He has a normal mood and affect. His speech is normal. Judgment normal. Cognition and memory are normal.    CMP ( most recent) CMP     Component Value Date/Time   NA 143 06/21/2018 1344   K 3.8 06/21/2018 1344   CL 108 06/21/2018 1344   CO2 26 06/21/2018 1344   GLUCOSE 105 (H) 06/21/2018 1344   BUN 19 06/21/2018 1344   CREATININE 0.76 06/21/2018 1344   CALCIUM 9.2 06/21/2018 1344   PROT 7.0 06/21/2018 1344   ALBUMIN 4.0 06/21/2018 1344   AST 15 06/21/2018 1344   ALT 21 06/21/2018 1344   ALKPHOS 65 06/21/2018 1344   BILITOT 1.0 06/21/2018 1344   GFRNONAA >60 06/21/2018 1344   GFRAA >60 06/21/2018 1344     Diabetic Labs (most recent): Lab Results  Component Value Date   HGBA1C 6.2 (H) 05/20/2018   HGBA1C 6.5 (H) 03/19/2018   HGBA1C 9.7 (H) 01/20/2018     Lipid Panel ( most recent) Lipid Panel     Component Value Date/Time   CHOL 168 01/20/2018 0247   TRIG 51 01/20/2018 0247   HDL 45 01/20/2018 0247   CHOLHDL 3.7 01/20/2018 0247   VLDL 10 01/20/2018 0247   LDLCALC 113 (H) 01/20/2018 0247    Assessment & Plan:   1. Uncontrolled type 2 diabetes mellitus with hyperglycemia ([place])  - Mark Foster has history of uncontrolled diabetes with A1c of 9.7% in May 2019.   Since he was started on basal insulin, he continued to improve in his glycemic profile and presents with A1c of 6.2% and average blood glucose of 157 for the last 90 days.     -  Recent labs reviewed.  -his diabetes is likely pancreatic diabetes given his history of heavy alcohol use in  the past, or even possible that he has LADA (late onset autoimmune diabetes of adults-which will need anti-pancreatic antibody test to classify  properly) complicated by CVA with significant carotid artery occlusion waiting for stent placement and Mark Foster remains at a high risk for more acute and chronic complications which include CAD, CVA, CKD, retinopathy, and neuropathy. These are all discussed in detail with the patient.  - I have counseled him on diet management  by adopting a carbohydrate restricted/protein rich diet.  -  Suggestion is made for him to avoid simple carbohydrates  from his diet including Cakes, Sweet Desserts / Pastries, Ice Cream, Soda (diet and regular), Sweet Tea, Candies, Chips, Cookies, Store Bought Juices, Alcohol in Excess of  1-2 drinks a day, Artificial Sweeteners, and "Sugar-free" Products. This will help patient to have stable blood glucose profile and potentially avoid unintended weight gain.   - I encouraged him to switch to  unprocessed or minimally processed complex starch and increased protein intake (animal or plant source), fruits, and vegetables.  - he is advised to stick to a routine mealtimes to eat 3 meals  a day and avoid unnecessary snacks ( to snack only to correct hypoglycemia).   - he has been  scheduled with Mark Foster, RDN, CDE for individualized diabetes education.  - I have approached him with the following individualized plan to manage diabetes and patient agrees:   -He is not a suitable candidate for oral antidiabetic medications nor incretin therapy.  -He has responded to basal insulin.  He is advised to continue Lantus 20 units every morning, continue to document blood glucose 4 times a day-before meals and at bedtime. -Based on his current presentation, he will not require prandial insulin nor additional medications. -Patient is encouraged to call clinic for blood glucose levels less than 70 or above 300 mg /dl.  - Patient specific target  A1c;  LDL, HDL, Triglycerides, and  Waist Circumference were discussed in detail. -He is anti-GAD antibodies, anti-islet cell  antibodies are negative indicating his diabetes is not likely to be type I, likely related to his prior alcohol abuse causing pancreatic diabetes.  -The priority in this patient would be to avoid hypoglycemia due to lack of alpha cell  glucagon response on the background of alcohol induced diabetes.  2) BP/HTN: His blood pressure is controlled to target.  Patient is not on ACE inhibitors nor ARB.  He will be considered for low-dose lisinopril on subsequent visits.    3) Lipids/HPL:   His recent labs show LDL of 113.  He is advised to continue Lipitor 80 mg p.o. nightly.  4)  Weight/Diet: He does not have access weight to lose, CDE Consult will be initiated , exercise, and detailed carbohydrates information provided.  5) hypothyroidism -He is currently on NP thyroid 30 mg daily.  He will have repeat thyroid function test before his next visit.  He will be considered for levothyroxine treatment as necessary after his next visit.  6) Chronic Care/Health Maintenance:  -he  is on Statin medications and  is encouraged to continue to follow up with Ophthalmology, Dentist,  Podiatrist at least yearly or according to recommendations, and advised to  stay away from smoking. I have recommended yearly flu vaccine and pneumonia vaccination at least every 5 years; moderate intensity exercise for up to 150 minutes weekly; and  sleep for at least 7 hours a day.  -He is advised to discontinue vitamin B12 supplement since his serum levels of B12  are supraphysiologic levels.  Due to his concern of ongoing fatigue, he will be considered for fasting total testosterone level along with his next blood work.  - I advised patient to maintain close follow up with Mark Albee, MD for primary care needs.    Follow up plan: - Return in about 4 months (around 11/22/2018) for Follow up with Pre-visit Labs, Meter, and Logs.  Glade Lloyd, MD [facility] [address] [place], [place] [address] Phone: [phone]  Fax: [phone]    07/24/2018, 12:36 PM  This note was partially dictated with voice recognition software. Similar sounding words can be transcribed inadequately or may not  be corrected upon review.

## 2018-08-05 ENCOUNTER — Telehealth: Payer: Self-pay | Admitting: *Deleted

## 2018-08-05 NOTE — Telephone Encounter (Signed)
Patient called about a denial letter from [facility] for the CT Ab Pelvis that Dr Audelia Hives ordered.  Sent email to Gaspar Bidding for review to see if appeal is applicable or if peer to peer would be helpful in this situation.

## 2018-08-06 ENCOUNTER — Telehealth: Payer: Self-pay | Admitting: *Deleted

## 2018-08-06 NOTE — Telephone Encounter (Signed)
Received response from [place] that peer to peer information was sent to Dr Audelia Hives via staff msg 07/26/2018 and his desk nurse.

## 2018-08-07 ENCOUNTER — Other Ambulatory Visit: Payer: Self-pay | Admitting: Hematology and Oncology

## 2018-08-07 DIAGNOSIS — D696 Thrombocytopenia, unspecified: Secondary | ICD-10-CM

## 2018-08-07 DIAGNOSIS — K7460 Unspecified cirrhosis of liver: Secondary | ICD-10-CM

## 2018-08-07 DIAGNOSIS — D72819 Decreased white blood cell count, unspecified: Secondary | ICD-10-CM

## 2018-08-09 ENCOUNTER — Inpatient Hospital Stay: Payer: BLUE CROSS/BLUE SHIELD | Admitting: Hematology and Oncology

## 2018-08-09 ENCOUNTER — Telehealth: Payer: Self-pay | Admitting: Hematology and Oncology

## 2018-08-09 ENCOUNTER — Ambulatory Visit (HOSPITAL_COMMUNITY): Admission: RE | Admit: 2018-08-09 | Payer: BLUE CROSS/BLUE SHIELD | Source: Ambulatory Visit

## 2018-08-09 ENCOUNTER — Other Ambulatory Visit: Payer: Self-pay | Admitting: Hematology and Oncology

## 2018-08-09 DIAGNOSIS — K760 Fatty (change of) liver, not elsewhere classified: Secondary | ICD-10-CM

## 2018-08-09 DIAGNOSIS — R634 Abnormal weight loss: Secondary | ICD-10-CM

## 2018-08-09 NOTE — Telephone Encounter (Signed)
Patient left message re cancelling appointment due to not having copay. Returned call and was not able to reach patient. Left message for patient confirming cancellation and asked that patient call back to reschedule.

## 2018-08-12 ENCOUNTER — Telehealth: Payer: Self-pay | Admitting: *Deleted

## 2018-08-12 NOTE — Telephone Encounter (Signed)
Patient called to reschedule appointment with hematologist. Patient had been seen by Audelia Hives, MD. A scheduling message placed for patient to be notified by a scheduler. Made patient aware.

## 2018-08-13 ENCOUNTER — Telehealth: Payer: Self-pay | Admitting: Hematology and Oncology

## 2018-08-13 NOTE — Telephone Encounter (Signed)
R/s appt per 12/09 sch message - pt is aware of appts .

## 2018-08-14 ENCOUNTER — Telehealth: Payer: Self-pay | Admitting: Hematology

## 2018-08-14 NOTE — Telephone Encounter (Signed)
Scheduled appt per 12/10 sch message - pt is aware of appt date and time   

## 2018-08-16 ENCOUNTER — Ambulatory Visit (HOSPITAL_COMMUNITY)
Admission: RE | Admit: 2018-08-16 | Discharge: 2018-08-16 | Disposition: A | Discharge status: Home / Self Care | Payer: BLUE CROSS/BLUE SHIELD | Source: Ambulatory Visit | Attending: Hematology and Oncology | Admitting: Hematology and Oncology

## 2018-08-16 DIAGNOSIS — D72819 Decreased white blood cell count, unspecified: Secondary | ICD-10-CM | POA: Diagnosis not present

## 2018-08-16 DIAGNOSIS — K7460 Unspecified cirrhosis of liver: Secondary | ICD-10-CM

## 2018-08-16 DIAGNOSIS — D696 Thrombocytopenia, unspecified: Secondary | ICD-10-CM | POA: Insufficient documentation

## 2018-08-16 DIAGNOSIS — K8020 Calculus of gallbladder without cholecystitis without obstruction: Secondary | ICD-10-CM | POA: Diagnosis not present

## 2018-08-20 ENCOUNTER — Other Ambulatory Visit: Payer: Self-pay | Admitting: *Deleted

## 2018-08-20 ENCOUNTER — Other Ambulatory Visit: Payer: Self-pay | Admitting: "Endocrinology

## 2018-08-20 ENCOUNTER — Inpatient Hospital Stay: Payer: BLUE CROSS/BLUE SHIELD | Attending: Hematology and Oncology

## 2018-08-20 ENCOUNTER — Ambulatory Visit (HOSPITAL_COMMUNITY)
Admission: RE | Admit: 2018-08-20 | Discharge: 2018-08-20 | Disposition: A | Discharge status: Home / Self Care | Payer: BLUE CROSS/BLUE SHIELD | Source: Ambulatory Visit | Attending: Hematology and Oncology | Admitting: Hematology and Oncology

## 2018-08-20 ENCOUNTER — Telehealth: Payer: Self-pay | Admitting: *Deleted

## 2018-08-20 ENCOUNTER — Telehealth: Payer: Self-pay

## 2018-08-20 DIAGNOSIS — D696 Thrombocytopenia, unspecified: Secondary | ICD-10-CM | POA: Diagnosis not present

## 2018-08-20 DIAGNOSIS — K7460 Unspecified cirrhosis of liver: Secondary | ICD-10-CM | POA: Diagnosis not present

## 2018-08-20 DIAGNOSIS — Z79899 Other long term (current) drug therapy: Secondary | ICD-10-CM | POA: Diagnosis not present

## 2018-08-20 DIAGNOSIS — Z7982 Long term (current) use of aspirin: Secondary | ICD-10-CM | POA: Diagnosis not present

## 2018-08-20 DIAGNOSIS — D72819 Decreased white blood cell count, unspecified: Secondary | ICD-10-CM | POA: Diagnosis not present

## 2018-08-20 DIAGNOSIS — K760 Fatty (change of) liver, not elsewhere classified: Secondary | ICD-10-CM | POA: Insufficient documentation

## 2018-08-20 DIAGNOSIS — M4802 Spinal stenosis, cervical region: Secondary | ICD-10-CM

## 2018-08-20 DIAGNOSIS — R634 Abnormal weight loss: Secondary | ICD-10-CM | POA: Insufficient documentation

## 2018-08-20 LAB — CBC WITH DIFFERENTIAL (CANCER CENTER ONLY)
ABS IMMATURE GRANULOCYTES: 0 10*3/uL (ref 0.00–0.07)
Basophils Absolute: 0 10*3/uL (ref 0.0–0.1)
Basophils Relative: 0 %
Eosinophils Absolute: 0 10*3/uL (ref 0.0–0.5)
Eosinophils Relative: 1 %
HCT: 38.9 % — ABNORMAL LOW (ref 39.0–52.0)
Hemoglobin: 13.9 g/dL (ref 13.0–17.0)
Immature Granulocytes: 0 %
LYMPHS ABS: 1.1 10*3/uL (ref 0.7–4.0)
Lymphocytes Relative: 45 %
MCH: 33.2 pg (ref 26.0–34.0)
MCHC: 35.7 g/dL (ref 30.0–36.0)
MCV: 92.8 fL (ref 80.0–100.0)
Monocytes Absolute: 0.1 10*3/uL (ref 0.1–1.0)
Monocytes Relative: 5 %
Neutro Abs: 1.2 10*3/uL — ABNORMAL LOW (ref 1.7–7.7)
Neutrophils Relative %: 49 %
Platelet Count: 118 10*3/uL — ABNORMAL LOW (ref 150–400)
RBC: 4.19 MIL/uL — ABNORMAL LOW (ref 4.22–5.81)
RDW: 13.6 % (ref 11.5–15.5)
WBC Count: 2.4 10*3/uL — ABNORMAL LOW (ref 4.0–10.5)
nRBC: 0 % (ref 0.0–0.2)

## 2018-08-20 LAB — CMP (CANCER CENTER ONLY)
ALT: 23 U/L (ref 0–44)
AST: 22 U/L (ref 15–41)
Albumin: 4.1 g/dL (ref 3.5–5.0)
Alkaline Phosphatase: 72 U/L (ref 38–126)
Anion gap: 8 (ref 5–15)
BUN: 11 mg/dL (ref 8–23)
CO2: 27 mmol/L (ref 22–32)
CREATININE: 0.72 mg/dL (ref 0.61–1.24)
Calcium: 9.4 mg/dL (ref 8.9–10.3)
Chloride: 107 mmol/L (ref 98–111)
GFR, Est AFR Am: >60 mL/min (ref >60)
GFR, Estimated: >60 mL/min (ref >60)
Glucose, Bld: 95 mg/dL (ref 70–99)
Potassium: 4.1 mmol/L (ref 3.5–5.1)
Sodium: 142 mmol/L (ref 135–145)
Total Bilirubin: 1.8 mg/dL — ABNORMAL HIGH (ref 0.3–1.2)
Total Protein: 6.8 g/dL (ref 6.5–8.1)

## 2018-08-20 MED ORDER — INSULIN DEGLUDEC 100 UNIT/ML ~~LOC~~ SOPN: 20.0000 [IU] | PEN_INJECTOR | Freq: Every day | SUBCUTANEOUS | 2 refills | Status: DC

## 2018-08-20 MED ORDER — SODIUM CHLORIDE (PF) 0.9 % IJ SOLN
INTRAMUSCULAR | Status: AC
  Filled 2018-08-20: qty 50

## 2018-08-20 MED ORDER — IOHEXOL 300 MG/ML  SOLN
100.0000 mL | Freq: Once | INTRAMUSCULAR | Status: AC | PRN
  Administered 2018-08-20: 100 mL via INTRAVENOUS

## 2018-08-20 NOTE — Telephone Encounter (Signed)
 "  Today CT Abdomen with contrast performed.  Opal Sidles with [facility] with call report for Dr. Denyse Amass.    IMPRESSION: Atrophy and ductal dilatation involving the pancreatic tail, with suspected small soft tissue mass in the pancreatic body which could represent pancreatic carcinoma. Abdomen MRI and MRCP without and with contrast is recommended for further evaluation."  Next scheduled F/U with Dr. Maylon Peppers on September 03, 2018 at 10:00 am.

## 2018-08-20 NOTE — Telephone Encounter (Signed)
Mark Foster Is calling stating that he is about out of Lantus and he states that Dr. Dorris Fetch was going to d/c lantus and prescribe something different, please advise?

## 2018-08-20 NOTE — Telephone Encounter (Signed)
I sent a prescription for Mark Foster to his pharmacy.

## 2018-08-21 ENCOUNTER — Other Ambulatory Visit: Payer: Self-pay | Admitting: Hematology

## 2018-08-21 DIAGNOSIS — K8689 Other specified diseases of pancreas: Secondary | ICD-10-CM

## 2018-08-21 LAB — RHEUMATOID FACTOR: Rhuematoid fact SerPl-aCnc: <10 IU/mL (ref 0.0–13.9)

## 2018-08-21 NOTE — Progress Notes (Unsigned)
ab 

## 2018-08-21 NOTE — Telephone Encounter (Signed)
I discussed the imaging results with the patient. I have ordered abdominal MRI and referred the patient to GI for further evaluation and possible biopsy. Patient was in agreement with the plan.

## 2018-08-23 ENCOUNTER — Other Ambulatory Visit: Payer: Self-pay | Admitting: Hematology

## 2018-08-23 NOTE — Progress Notes (Signed)
[place] OFFICE PROGRESS NOTE  Patient Care Team: Doree Albee, MD as PCP - General (Internal Medicine)  HEME/ONC OVERVIEW: 1. Incidental pancreatic body lesion -08/2018: CT abdomen/pelvis (for leukopenia/thrombocytopenia) showed an incidental pancreatic body soft tissue prominence ~2cm; atrophy and pancreatic ductal dilatation involving pancreatic tail -Late 08/2018: MRI abdomen showed a 1.8 x 1.8cm area of hypoenhancement at the junction between the pancreatic head and body  -GI evaluation pending   2. Mild leukopenia  -WBC ~2-3k w/ ANC > 1000 since early 2019; no prior for comparison -Viral, nutritional studies unremarkable   3. Mild thrombocytopenia (plts ~100-110k's)  PERTINENT NON-HEM/ONC PROBLEMS: 1. Extensive cerebrovascular disease 2. Hx of EtOH abuse   ASSESSMENT & PLAN:   Incidental pancreatic body lesion -I independently reviewed the radiologic images of CT and MRI abdomen/pelvis, and agree with the findings as documented -In summary, CT AP showed an incidental pancreatic body soft tissue prominence measuring approximately 2 cm, subsequently confirmed on MRI abdomen -I discussed imaging report in detail with the patient -Patient was very concerned about the possibility of cancer; we discussed at length regarding the differential diagnoses, including benign changes such as cysts as well as different types of malignancies that can be found within the pancreas, such as adenocarcinoma and neuroendocrine tumor -GI referral has ordered but not yet scheduled; we have reached out to GI clinic, who will expedite the appt for the patient  -Pending the biopsy result, we will determine if further work-up is indicated   Mild leukopenia -Etiology unclear; no labs for comparison prior to 2019,; however, patient reports that he has known about the mildly low blood counts for several years prior the referral  -Patient denies any frequent infections or abx use   -Viral and nutritional studies unremarkable; no lymphadenopathy on CT AP and neck -Possibly due to bone marrow suppression from hx of heavy EtOH abuse; other ddx includes early MDS  -Given the overall stable leukopenia and absence of concerning symptoms (constitutional symptoms, frequent infections), we will repeat labs in 1 month before pursuing further work-up  Mild thrombocytopenia -Etiology unclear; no labs prior to 2019 but likely chronic per patient -Patient denies any symptoms of bleeding or bruising -Tbili intermittently elevated, most recently 1.8 in mid-08/2018, suggesting possible liver disease; however, no evidence of cirrhosis on CT and US abdomen   -Given the overall stability, there is no urgency in pursuing bone marrow biopsy -We will plan to reassess labs in 1 month and if thrombocytopenia worsens, then we can consider bone marrow biopsy   Intermittent mild hyperbilirubinemia -Tbili fluctuates as mentioned above -MRI abdomen results as above -GI evaluation not yet scheduled   Hx of EtOH abuse -Patient reports that he quit EtOH use in 1990's -I counseled the patient on the importance of alcohol abstinence   Orders Placed This Encounter  Procedures  . CBC with Differential (Cancer Center Only)    Standing Status:   Future    Standing Expiration Date:   10/08/2019  . Hepatic function panel    Standing Status:   Future    Standing Expiration Date:   10/08/2019  . Basic Metabolic Panel - [place] Only    Standing Status:   Future    Standing Expiration Date:   10/08/2019   All questions were answered. The patient knows to call the clinic with any problems, questions or concerns. No barriers to learning was detected.  A total of more than 25 minutes were spent face-to-face with the patient  during this encounter and over half of that time was spent on counseling and coordination of care as outlined above.   Return in 1 month for labs and clinic follow-up.   Mark Men,  MD 09/03/2018 11:57 AM  CHIEF COMPLAINT: "I am here to find out my MRI results "  INTERVAL HISTORY: Mark Foster returns to clinic for follow-up of recent MRI abdomen results.  He reports that over the past 2 weeks, he has occasional, sharp, left lower quadrant pain, nonradiating, not associated with any other symptom.  He denies any fever, chill, night sweats, weight change, nausea, vomiting, diarrhea, hematochezia, or melena.  He has history of heavy alcohol use, which he quit in the late 1990s, and he currently only drinks occasionally.  He denies any drug use or recent medication changes.  He denies any recent infections or antibiotic usage.   REVIEW OF SYSTEMS:   Constitutional: ( - ) fevers, ( - )  chills , ( - ) night sweats Eyes: ( - ) blurriness of vision, ( - ) double vision, ( - ) watery eyes Ears, nose, mouth, throat, and face: ( - ) mucositis, ( - ) sore throat Respiratory: ( - ) cough, ( - ) dyspnea, ( - ) wheezes Cardiovascular: ( - ) palpitation, ( - ) chest discomfort, ( - ) lower extremity swelling Gastrointestinal:  ( - ) nausea, ( - ) heartburn, ( - ) change in bowel habits Skin: ( - ) abnormal skin rashes Lymphatics: ( - ) new lymphadenopathy, ( - ) easy bruising Neurological: ( - ) numbness, ( - ) tingling, ( - ) new weaknesses Behavioral/Psych: ( - ) mood change, ( - ) new changes  All other systems were reviewed with the patient and are negative.  I have reviewed the past medical history, past surgical history, social history and family history with the patient and they are unchanged from previous note.  ALLERGIES:  is allergic to demerol [meperidine hcl].  MEDICATIONS:  Current Outpatient Medications  Medication Sig Dispense Refill  . aspirin 81 MG tablet Take 1 tablet (81 mg total) by mouth daily. 30 tablet 3  . atorvastatin (LIPITOR) 80 MG tablet Take 1 tablet (80 mg total) by mouth daily at 6 PM. 30 tablet 2  . Continuous Blood Gluc Sensor (FREESTYLE LIBRE 14  DAY SENSOR) MISC 1 Device by Does not apply route every 14 (fourteen) days. 2 each 2  . ECHINACEA EXTRACT PO Take 760 mg by mouth daily.    Marland Kitchen gabapentin (NEURONTIN) 300 MG capsule Take 1 capsule (300 mg total) by mouth 2 (two) times daily. 60 capsule 3  . insulin degludec (TRESIBA FLEXTOUCH) 100 UNIT/ML SOPN FlexTouch Pen Inject 0.2 mLs (20 Units total) into the skin daily. 5 pen 2  . Insulin Pen Needle 32G X 4 MM MISC 1 Device by Does not apply route daily. 50 each 2  . NP THYROID 30 MG tablet Take 30 mg by mouth daily before breakfast.   2  . sertraline (ZOLOFT) 50 MG tablet Take 50 mg by mouth every morning.     . ticagrelor (BRILINTA) 90 MG TABS tablet Take 90 mg by mouth 2 (two) times daily.     No current facility-administered medications for this visit.     PHYSICAL EXAMINATION: ECOG PERFORMANCE STATUS: 1 - Symptomatic but completely ambulatory  Today's Vitals   09/03/18 0952 09/03/18 0955  BP: 113/79   Pulse: 86   Resp: 18   Temp: 97.8 F (  36.6 C)   TempSrc: Oral   SpO2: 100%   Weight: 153 lb (69.4 kg)   Height: '5\' 10"'$  (1.778 m)   PainSc:  0-No pain   Body mass index is 21.95 kg/m.  Filed Weights   09/03/18 0952  Weight: 153 lb (69.4 kg)    GENERAL: alert, no distress and comfortable, thin SKIN: skin color, texture, turgor are normal, no rashes or significant lesions EYES: conjunctiva are pink and non-injected, sclera clear OROPHARYNX: no exudate, no erythema; lips, buccal mucosa, and tongue normal  NECK: supple, non-tender LYMPH:  no palpable lymphadenopathy in the cervical or axillary  LUNGS: clear to auscultation and percussion with normal breathing effort HEART: regular rate & rhythm and no murmurs and no lower extremity edema ABDOMEN: soft, non-tender, non-distended, normal bowel sounds Musculoskeletal: no cyanosis of digits and no clubbing  PSYCH: alert & oriented x 3, fluent speech  LABORATORY DATA:  I have reviewed the data as listed    Component  Value Date/Time   NA 142 08/20/2018 1224   K 4.1 08/20/2018 1224   CL 107 08/20/2018 1224   CO2 27 08/20/2018 1224   GLUCOSE 95 08/20/2018 1224   BUN 11 08/20/2018 1224   CREATININE 0.72 08/20/2018 1224   CALCIUM 9.4 08/20/2018 1224   PROT 6.8 08/20/2018 1224   ALBUMIN 4.1 08/20/2018 1224   AST 22 08/20/2018 1224   ALT 23 08/20/2018 1224   ALKPHOS 72 08/20/2018 1224   BILITOT 1.8 (H) 08/20/2018 1224   GFRNONAA >60 08/20/2018 1224   GFRAA >60 08/20/2018 1224    No results found for: SPEP, UPEP  Lab Results  Component Value Date   WBC 2.4 (L) 08/20/2018   NEUTROABS 1.2 (L) 08/20/2018   HGB 13.9 08/20/2018   HCT 38.9 (L) 08/20/2018   MCV 92.8 08/20/2018   PLT 118 (L) 08/20/2018      Chemistry      Component Value Date/Time   NA 142 08/20/2018 1224   K 4.1 08/20/2018 1224   CL 107 08/20/2018 1224   CO2 27 08/20/2018 1224   BUN 11 08/20/2018 1224   CREATININE 0.72 08/20/2018 1224      Component Value Date/Time   CALCIUM 9.4 08/20/2018 1224   ALKPHOS 72 08/20/2018 1224   AST 22 08/20/2018 1224   ALT 23 08/20/2018 1224   BILITOT 1.8 (H) 08/20/2018 1224       RADIOGRAPHIC STUDIES: I have personally reviewed the radiological images as listed below and agreed with the findings in the report. Ct Abdomen W Contrast  Result Date: 08/20/2018 CLINICAL DATA:  Generalized abdominal pain and fatigue for 1 month. Abnormal weight loss. Cirrhosis or fatty liver. Thrombocytopenia and leukopenia. EXAM: CT ABDOMEN WITH CONTRAST TECHNIQUE: Multidetector CT imaging of the abdomen was performed using the standard protocol following bolus administration of intravenous contrast. CONTRAST:  161m OMNIPAQUE IOHEXOL 300 MG/ML  SOLN COMPARISON:  Abdomen ultrasound on 08/16/2018 FINDINGS: Lower chest: No acute findings. Hepatobiliary: No hepatic masses identified. No radiographic signs of steatosis or cirrhosis. Gallbladder is unremarkable. No evidence of biliary ductal dilatation. Pancreas:  Atrophy and pancreatic ductal dilatation is seen involving the pancreatic tail. Rounded soft tissue prominence is seen in the pancreatic body measuring approximately 2 cm, which raises suspicion for a small pancreatic mass. No evidence of peripancreatic inflammatory changes or fluid collections. Spleen:  Within normal limits in size and appearance. Adrenals/Urinary Tract: No masses identified. No evidence of hydronephrosis. Stomach/Bowel: Visualized portion unremarkable. Vascular/Lymphatic: No pathologically enlarged lymph  nodes identified. No abdominal aortic aneurysm. Aortic atherosclerosis. Other:  None. Musculoskeletal:  No suspicious bone lesions identified. IMPRESSION: Atrophy and ductal dilatation involving the pancreatic tail, with suspected small soft tissue mass in the pancreatic body which could represent pancreatic carcinoma. Abdomen MRI and MRCP without and with contrast is recommended for further evaluation. No evidence of hepatobiliary disease. These results will be called to the ordering clinician or representative by the Radiologist Assistant, and communication documented in the PACS or zVision Dashboard. Electronically Signed   By: Earle Gell M.D.   On: 08/20/2018 15:45   Mr Abdomen W Wo Contrast  Result Date: 08/30/2018 CLINICAL DATA:  Possible pancreatic mass on abdominal CT. EXAM: MRI ABDOMEN WITHOUT AND WITH CONTRAST TECHNIQUE: Multiplanar multisequence MR imaging of the abdomen was performed both before and after the administration of intravenous contrast. CONTRAST:  7 cc Gadavist COMPARISON:  Abdominal ultrasound 08/16/2018. Abdominal CT 08/20/2018. FINDINGS: Despite efforts by the technologist and patient, mild motion artifact is present on today's exam and could not be eliminated. This reduces exam sensitivity and specificity. Lower chest:  The visualized lower chest appears unremarkable. Hepatobiliary: The liver is normal in signal without morphologic changes of cirrhosis. Following  contrast, there is no abnormal enhancement or focal lesion. There is a small gallstone. No gallbladder wall thickening or biliary dilatation. Pancreas: As demonstrated on CT, there is dilatation of the main pancreatic duct in the pancreatic tail to 5 mm and associated parenchymal atrophy. There is an abrupt transition in ductal caliber at the junction of the pancreatic head and body. In correlation with prior CT, there remains suspicion a subtle mass in this area, demonstrating low T2 signal and mild hypoenhancement. This measures approximately 18 x 18 mm on image 25/6 although is not well seen on many of the pulse sequences. In the extreme pancreatic tail, there is a tiny cystic lesion measuring 9 mm which shows no enhancement. There is no dilatation of the pancreatic duct in the pancreatic head. Spleen: Normal in size without focal abnormality. Adrenals/Urinary Tract: Both adrenal glands appear normal. The kidneys and proximal ureters demonstrate no significant findings. Stomach/Bowel: No evidence of bowel wall thickening, distention or surrounding inflammatory change. 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 Vascular/Lymphatic: There are no enlarged abdominal lymph nodes. Aortic and branch vessel atherosclerosis, better seen on CT. The splenic, superior mesenteric and portal veins are patent. No evidence of vascular encasement. Other: No ascites or peritoneal nodularity. Musculoskeletal: No acute or significant osseous findings. IMPRESSION: 1. Although not definitive, there remains concern of a small hypoenhancing mass at the junction of the pancreatic body and tail associated with atrophy and ductal dilatation in the pancreatic tail. This remains concerning for pancreatic neoplasm. Postinflammatory stricture less likely. Besides a tiny cystic lesion in the pancreatic tail, there are no other signs of previous  pancreatitis. Further evaluation with endoscopic ultrasound for possible biopsy strongly recommended. 2. No evidence of metastatic disease. 3. Cholelithiasis without evidence of cholecystitis or biliary dilatation. Electronically Signed   By: Richardean Sale M.D.   On: 08/30/2018 08:25   US Abdomen Complete  Result Date: 08/16/2018 CLINICAL DATA:  Thrombocytopenia.  Cirrhosis.  Rule out splenomegaly EXAM: ABDOMEN ULTRASOUND COMPLETE COMPARISON:  None. FINDINGS: Gallbladder: 7 mm mobile gallstone. No wall thickening or evidence of sonographic Murphy's sign. Common bile duct: Diameter: Normal caliber, 3 mm. Liver: No focal lesion identified. Within normal limits in parenchymal  echogenicity. Portal vein is patent on color Doppler imaging with normal direction of blood flow towards the liver. IVC: No abnormality visualized. Pancreas: Not well visualized due to overlying bowel gas. Spleen: 10.3 cm in craniocaudal leads, normal. No focal abnormality. Right Kidney: Length: 11.7 cm. Echogenicity within normal limits. No mass or hydronephrosis visualized. Left Kidney: Length: 12.5 cm. Echogenicity within normal limits. No mass or hydronephrosis visualized. Abdominal aorta: No aneurysm visualized. Other findings: None. IMPRESSION: No sonographic evidence for cirrhosis. Spleen normal size. Cholelithiasis.  No sonographic evidence of acute cholecystitis. Electronically Signed   By: Rolm Baptise M.D.   On: 08/16/2018 11:35

## 2018-08-23 NOTE — H&P (View-Only) (Signed)
[place] OFFICE PROGRESS NOTE  Patient Care Team: Doree Albee, MD as PCP - General (Internal Medicine)  HEME/ONC OVERVIEW: 1. Incidental pancreatic body lesion -08/2018: CT abdomen/pelvis (for leukopenia/thrombocytopenia) showed an incidental pancreatic body soft tissue prominence ~2cm; atrophy and pancreatic ductal dilatation involving pancreatic tail -Late 08/2018: MRI abdomen showed a 1.8 x 1.8cm area of hypoenhancement at the junction between the pancreatic head and body  -GI evaluation pending   2. Mild leukopenia  -WBC ~2-3k w/ ANC > 1000 since early 2019; no prior for comparison -Viral, nutritional studies unremarkable   3. Mild thrombocytopenia (plts ~100-110k's)  PERTINENT NON-HEM/ONC PROBLEMS: 1. Extensive cerebrovascular disease 2. Hx of EtOH abuse   ASSESSMENT & PLAN:   Incidental pancreatic body lesion -I independently reviewed the radiologic images of CT and MRI abdomen/pelvis, and agree with the findings as documented -In summary, CT AP showed an incidental pancreatic body soft tissue prominence measuring approximately 2 cm, subsequently confirmed on MRI abdomen -I discussed imaging report in detail with the patient -Patient was very concerned about the possibility of cancer; we discussed at length regarding the differential diagnoses, including benign changes such as cysts as well as different types of malignancies that can be found within the pancreas, such as adenocarcinoma and neuroendocrine tumor -GI referral has ordered but not yet scheduled; we have reached out to GI clinic, who will expedite the appt for the patient  -Pending the biopsy result, we will determine if further work-up is indicated   Mild leukopenia -Etiology unclear; no labs for comparison prior to 2019,; however, patient reports that he has known about the mildly low blood counts for several years prior the referral  -Patient denies any frequent infections or abx use   -Viral and nutritional studies unremarkable; no lymphadenopathy on CT AP and neck -Possibly due to bone marrow suppression from hx of heavy EtOH abuse; other ddx includes early MDS  -Given the overall stable leukopenia and absence of concerning symptoms (constitutional symptoms, frequent infections), we will repeat labs in 1 month before pursuing further work-up  Mild thrombocytopenia -Etiology unclear; no labs prior to 2019 but likely chronic per patient -Patient denies any symptoms of bleeding or bruising -Tbili intermittently elevated, most recently 1.8 in mid-08/2018, suggesting possible liver disease; however, no evidence of cirrhosis on CT and US abdomen   -Given the overall stability, there is no urgency in pursuing bone marrow biopsy -We will plan to reassess labs in 1 month and if thrombocytopenia worsens, then we can consider bone marrow biopsy   Intermittent mild hyperbilirubinemia -Tbili fluctuates as mentioned above -MRI abdomen results as above -GI evaluation not yet scheduled   Hx of EtOH abuse -Patient reports that he quit EtOH use in 1990's -I counseled the patient on the importance of alcohol abstinence   Orders Placed This Encounter  Procedures  . CBC with Differential (Cancer Center Only)    Standing Status:   Future    Standing Expiration Date:   10/08/2019  . Hepatic function panel    Standing Status:   Future    Standing Expiration Date:   10/08/2019  . Basic Metabolic Panel - [place] Only    Standing Status:   Future    Standing Expiration Date:   10/08/2019   All questions were answered. The patient knows to call the clinic with any problems, questions or concerns. No barriers to learning was detected.  A total of more than 25 minutes were spent face-to-face with the patient  during this encounter and over half of that time was spent on counseling and coordination of care as outlined above.   Return in 1 month for labs and clinic follow-up.   Tish Men,  MD 09/03/2018 11:57 AM  CHIEF COMPLAINT: "I am here to find out my MRI results "  INTERVAL HISTORY: Mark Foster returns to clinic for follow-up of recent MRI abdomen results.  He reports that over the past 2 weeks, he has occasional, sharp, left lower quadrant pain, nonradiating, not associated with any other symptom.  He denies any fever, chill, night sweats, weight change, nausea, vomiting, diarrhea, hematochezia, or melena.  He has history of heavy alcohol use, which he quit in the late 1990s, and he currently only drinks occasionally.  He denies any drug use or recent medication changes.  He denies any recent infections or antibiotic usage.   REVIEW OF SYSTEMS:   Constitutional: ( - ) fevers, ( - )  chills , ( - ) night sweats Eyes: ( - ) blurriness of vision, ( - ) double vision, ( - ) watery eyes Ears, nose, mouth, throat, and face: ( - ) mucositis, ( - ) sore throat Respiratory: ( - ) cough, ( - ) dyspnea, ( - ) wheezes Cardiovascular: ( - ) palpitation, ( - ) chest discomfort, ( - ) lower extremity swelling Gastrointestinal:  ( - ) nausea, ( - ) heartburn, ( - ) change in bowel habits Skin: ( - ) abnormal skin rashes Lymphatics: ( - ) new lymphadenopathy, ( - ) easy bruising Neurological: ( - ) numbness, ( - ) tingling, ( - ) new weaknesses Behavioral/Psych: ( - ) mood change, ( - ) new changes  All other systems were reviewed with the patient and are negative.  I have reviewed the past medical history, past surgical history, social history and family history with the patient and they are unchanged from previous note.  ALLERGIES:  is allergic to demerol [meperidine hcl].  MEDICATIONS:  Current Outpatient Medications  Medication Sig Dispense Refill  . aspirin 81 MG tablet Take 1 tablet (81 mg total) by mouth daily. 30 tablet 3  . atorvastatin (LIPITOR) 80 MG tablet Take 1 tablet (80 mg total) by mouth daily at 6 PM. 30 tablet 2  . Continuous Blood Gluc Sensor (FREESTYLE LIBRE 14  DAY SENSOR) MISC 1 Device by Does not apply route every 14 (fourteen) days. 2 each 2  . ECHINACEA EXTRACT PO Take 760 mg by mouth daily.    Marland Kitchen gabapentin (NEURONTIN) 300 MG capsule Take 1 capsule (300 mg total) by mouth 2 (two) times daily. 60 capsule 3  . insulin degludec (TRESIBA FLEXTOUCH) 100 UNIT/ML SOPN FlexTouch Pen Inject 0.2 mLs (20 Units total) into the skin daily. 5 pen 2  . Insulin Pen Needle 32G X 4 MM MISC 1 Device by Does not apply route daily. 50 each 2  . NP THYROID 30 MG tablet Take 30 mg by mouth daily before breakfast.   2  . sertraline (ZOLOFT) 50 MG tablet Take 50 mg by mouth every morning.     . ticagrelor (BRILINTA) 90 MG TABS tablet Take 90 mg by mouth 2 (two) times daily.     No current facility-administered medications for this visit.     PHYSICAL EXAMINATION: ECOG PERFORMANCE STATUS: 1 - Symptomatic but completely ambulatory  Today's Vitals   09/03/18 0952 09/03/18 0955  BP: 113/79   Pulse: 86   Resp: 18   Temp: 97.8 F (  36.6 C)   TempSrc: Oral   SpO2: 100%   Weight: 153 lb (69.4 kg)   Height: '5\' 10"'$  (1.778 m)   PainSc:  0-No pain   Body mass index is 21.95 kg/m.  Filed Weights   09/03/18 0952  Weight: 153 lb (69.4 kg)    GENERAL: alert, no distress and comfortable, thin SKIN: skin color, texture, turgor are normal, no rashes or significant lesions EYES: conjunctiva are pink and non-injected, sclera clear OROPHARYNX: no exudate, no erythema; lips, buccal mucosa, and tongue normal  NECK: supple, non-tender LYMPH:  no palpable lymphadenopathy in the cervical or axillary  LUNGS: clear to auscultation and percussion with normal breathing effort HEART: regular rate & rhythm and no murmurs and no lower extremity edema ABDOMEN: soft, non-tender, non-distended, normal bowel sounds Musculoskeletal: no cyanosis of digits and no clubbing  PSYCH: alert & oriented x 3, fluent speech  LABORATORY DATA:  I have reviewed the data as listed    Component  Value Date/Time   NA 142 08/20/2018 1224   K 4.1 08/20/2018 1224   CL 107 08/20/2018 1224   CO2 27 08/20/2018 1224   GLUCOSE 95 08/20/2018 1224   BUN 11 08/20/2018 1224   CREATININE 0.72 08/20/2018 1224   CALCIUM 9.4 08/20/2018 1224   PROT 6.8 08/20/2018 1224   ALBUMIN 4.1 08/20/2018 1224   AST 22 08/20/2018 1224   ALT 23 08/20/2018 1224   ALKPHOS 72 08/20/2018 1224   BILITOT 1.8 (H) 08/20/2018 1224   GFRNONAA >60 08/20/2018 1224   GFRAA >60 08/20/2018 1224    No results found for: SPEP, UPEP  Lab Results  Component Value Date   WBC 2.4 (L) 08/20/2018   NEUTROABS 1.2 (L) 08/20/2018   HGB 13.9 08/20/2018   HCT 38.9 (L) 08/20/2018   MCV 92.8 08/20/2018   PLT 118 (L) 08/20/2018      Chemistry      Component Value Date/Time   NA 142 08/20/2018 1224   K 4.1 08/20/2018 1224   CL 107 08/20/2018 1224   CO2 27 08/20/2018 1224   BUN 11 08/20/2018 1224   CREATININE 0.72 08/20/2018 1224      Component Value Date/Time   CALCIUM 9.4 08/20/2018 1224   ALKPHOS 72 08/20/2018 1224   AST 22 08/20/2018 1224   ALT 23 08/20/2018 1224   BILITOT 1.8 (H) 08/20/2018 1224       RADIOGRAPHIC STUDIES: I have personally reviewed the radiological images as listed below and agreed with the findings in the report. Ct Abdomen W Contrast  Result Date: 08/20/2018 CLINICAL DATA:  Generalized abdominal pain and fatigue for 1 month. Abnormal weight loss. Cirrhosis or fatty liver. Thrombocytopenia and leukopenia. EXAM: CT ABDOMEN WITH CONTRAST TECHNIQUE: Multidetector CT imaging of the abdomen was performed using the standard protocol following bolus administration of intravenous contrast. CONTRAST:  173m OMNIPAQUE IOHEXOL 300 MG/ML  SOLN COMPARISON:  Abdomen ultrasound on 08/16/2018 FINDINGS: Lower chest: No acute findings. Hepatobiliary: No hepatic masses identified. No radiographic signs of steatosis or cirrhosis. Gallbladder is unremarkable. No evidence of biliary ductal dilatation. Pancreas:  Atrophy and pancreatic ductal dilatation is seen involving the pancreatic tail. Rounded soft tissue prominence is seen in the pancreatic body measuring approximately 2 cm, which raises suspicion for a small pancreatic mass. No evidence of peripancreatic inflammatory changes or fluid collections. Spleen:  Within normal limits in size and appearance. Adrenals/Urinary Tract: No masses identified. No evidence of hydronephrosis. Stomach/Bowel: Visualized portion unremarkable. Vascular/Lymphatic: No pathologically enlarged lymph  nodes identified. No abdominal aortic aneurysm. Aortic atherosclerosis. Other:  None. Musculoskeletal:  No suspicious bone lesions identified. IMPRESSION: Atrophy and ductal dilatation involving the pancreatic tail, with suspected small soft tissue mass in the pancreatic body which could represent pancreatic carcinoma. Abdomen MRI and MRCP without and with contrast is recommended for further evaluation. No evidence of hepatobiliary disease. These results will be called to the ordering clinician or representative by the Radiologist Assistant, and communication documented in the PACS or zVision Dashboard. Electronically Signed   By: Earle Gell M.D.   On: 08/20/2018 15:45   Mr Abdomen W Wo Contrast  Result Date: 08/30/2018 CLINICAL DATA:  Possible pancreatic mass on abdominal CT. EXAM: MRI ABDOMEN WITHOUT AND WITH CONTRAST TECHNIQUE: Multiplanar multisequence MR imaging of the abdomen was performed both before and after the administration of intravenous contrast. CONTRAST:  7 cc Gadavist COMPARISON:  Abdominal ultrasound 08/16/2018. Abdominal CT 08/20/2018. FINDINGS: Despite efforts by the technologist and patient, mild motion artifact is present on today's exam and could not be eliminated. This reduces exam sensitivity and specificity. Lower chest:  The visualized lower chest appears unremarkable. Hepatobiliary: The liver is normal in signal without morphologic changes of cirrhosis. Following  contrast, there is no abnormal enhancement or focal lesion. There is a small gallstone. No gallbladder wall thickening or biliary dilatation. Pancreas: As demonstrated on CT, there is dilatation of the main pancreatic duct in the pancreatic tail to 5 mm and associated parenchymal atrophy. There is an abrupt transition in ductal caliber at the junction of the pancreatic head and body. In correlation with prior CT, there remains suspicion a subtle mass in this area, demonstrating low T2 signal and mild hypoenhancement. This measures approximately 18 x 18 mm on image 25/6 although is not well seen on many of the pulse sequences. In the extreme pancreatic tail, there is a tiny cystic lesion measuring 9 mm which shows no enhancement. There is no dilatation of the pancreatic duct in the pancreatic head. Spleen: Normal in size without focal abnormality. Adrenals/Urinary Tract: Both adrenal glands appear normal. The kidneys and proximal ureters demonstrate no significant findings. Stomach/Bowel: No evidence of bowel wall thickening, distention or surrounding inflammatory change. 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 Vascular/Lymphatic: There are no enlarged abdominal lymph nodes. Aortic and branch vessel atherosclerosis, better seen on CT. The splenic, superior mesenteric and portal veins are patent. No evidence of vascular encasement. Other: No ascites or peritoneal nodularity. Musculoskeletal: No acute or significant osseous findings. IMPRESSION: 1. Although not definitive, there remains concern of a small hypoenhancing mass at the junction of the pancreatic body and tail associated with atrophy and ductal dilatation in the pancreatic tail. This remains concerning for pancreatic neoplasm. Postinflammatory stricture less likely. Besides a tiny cystic lesion in the pancreatic tail, there are no other signs of previous  pancreatitis. Further evaluation with endoscopic ultrasound for possible biopsy strongly recommended. 2. No evidence of metastatic disease. 3. Cholelithiasis without evidence of cholecystitis or biliary dilatation. Electronically Signed   By: Richardean Sale M.D.   On: 08/30/2018 08:25   US Abdomen Complete  Result Date: 08/16/2018 CLINICAL DATA:  Thrombocytopenia.  Cirrhosis.  Rule out splenomegaly EXAM: ABDOMEN ULTRASOUND COMPLETE COMPARISON:  None. FINDINGS: Gallbladder: 7 mm mobile gallstone. No wall thickening or evidence of sonographic Murphy's sign. Common bile duct: Diameter: Normal caliber, 3 mm. Liver: No focal lesion identified. Within normal limits in parenchymal  echogenicity. Portal vein is patent on color Doppler imaging with normal direction of blood flow towards the liver. IVC: No abnormality visualized. Pancreas: Not well visualized due to overlying bowel gas. Spleen: 10.3 cm in craniocaudal leads, normal. No focal abnormality. Right Kidney: Length: 11.7 cm. Echogenicity within normal limits. No mass or hydronephrosis visualized. Left Kidney: Length: 12.5 cm. Echogenicity within normal limits. No mass or hydronephrosis visualized. Abdominal aorta: No aneurysm visualized. Other findings: None. IMPRESSION: No sonographic evidence for cirrhosis. Spleen normal size. Cholelithiasis.  No sonographic evidence of acute cholecystitis. Electronically Signed   By: Rolm Baptise M.D.   On: 08/16/2018 11:35

## 2018-08-29 ENCOUNTER — Encounter: Payer: Self-pay | Admitting: Adult Health

## 2018-08-29 ENCOUNTER — Ambulatory Visit: Payer: BLUE CROSS/BLUE SHIELD | Admitting: Adult Health

## 2018-08-29 VITALS — BP 98/59 | HR 90 | Ht 70.0 in | Wt 155.8 lb

## 2018-08-29 DIAGNOSIS — I639 Cerebral infarction, unspecified: Secondary | ICD-10-CM | POA: Diagnosis not present

## 2018-08-29 DIAGNOSIS — E119 Type 2 diabetes mellitus without complications: Secondary | ICD-10-CM | POA: Diagnosis not present

## 2018-08-29 DIAGNOSIS — E785 Hyperlipidemia, unspecified: Secondary | ICD-10-CM | POA: Diagnosis not present

## 2018-08-29 DIAGNOSIS — I10 Essential (primary) hypertension: Secondary | ICD-10-CM | POA: Diagnosis not present

## 2018-08-29 DIAGNOSIS — R2990 Unspecified symptoms and signs involving the nervous system: Secondary | ICD-10-CM

## 2018-08-29 DIAGNOSIS — I6522 Occlusion and stenosis of left carotid artery: Secondary | ICD-10-CM

## 2018-08-29 MED ORDER — GABAPENTIN 300 MG PO CAPS: 300.0000 mg | ORAL_CAPSULE | Freq: Two times a day (BID) | ORAL | 3 refills | Status: DC

## 2018-08-29 NOTE — Patient Instructions (Addendum)
Continue aspirin 81 mg daily and Brilinta  and lipitor  for secondary stroke prevention  Continue to follow up with PCP regarding cholesterol and blood pressure management   Increase gabapentin from 300mg  nightly to 300mg  twice a day. If after 1 week, you continue to experience pain, please call office and we can increase  Continue to follow up with endocrinologist for diabetic management  I will reach out to Dr. Estanislado Pandy to see if he would like to see you again for any imaging and determine length of brilinta and aspirin  Continue to monitor blood pressure at home   Maintain strict control of hypertension with blood pressure goal below 130/90, diabetes with hemoglobin A1c goal below 6.5% and cholesterol with LDL cholesterol (bad cholesterol) goal below 70 mg/dL. I also advised the patient to eat a healthy diet with plenty of whole grains, cereals, fruits and vegetables, exercise regularly and maintain ideal body weight.  Followup in the future with me in 6 months or call earlier if needed       Thank you for coming to see [place] at [facility]. I hope we have been able to provide you high quality care today.  You may receive a patient satisfaction survey over the next few weeks. We would appreciate your feedback and comments so that we may continue to improve ourselves and the health of our patients.
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
## 2018-08-30 ENCOUNTER — Ambulatory Visit (HOSPITAL_COMMUNITY)
Admission: RE | Admit: 2018-08-30 | Discharge: 2018-08-30 | Disposition: A | Discharge status: Home / Self Care | Payer: BLUE CROSS/BLUE SHIELD | Source: Ambulatory Visit | Attending: Hematology | Admitting: Hematology

## 2018-08-30 DIAGNOSIS — K8689 Other specified diseases of pancreas: Secondary | ICD-10-CM

## 2018-08-30 DIAGNOSIS — K8020 Calculus of gallbladder without cholecystitis without obstruction: Secondary | ICD-10-CM | POA: Diagnosis not present

## 2018-08-30 MED ORDER — GADOBUTROL 1 MMOL/ML IV SOLN
7.5000 mL | Freq: Once | INTRAVENOUS | Status: AC | PRN
  Administered 2018-08-30: 7 mL via INTRAVENOUS

## 2018-09-03 ENCOUNTER — Telehealth: Payer: Self-pay | Admitting: Hematology

## 2018-09-03 ENCOUNTER — Inpatient Hospital Stay: Payer: BLUE CROSS/BLUE SHIELD | Admitting: Hematology

## 2018-09-03 ENCOUNTER — Telehealth: Payer: Self-pay | Admitting: Gastroenterology

## 2018-09-03 VITALS — BP 113/79 | HR 86 | Temp 97.8°F | Resp 18 | Ht 70.0 in | Wt 153.0 lb

## 2018-09-03 DIAGNOSIS — Z7982 Long term (current) use of aspirin: Secondary | ICD-10-CM | POA: Diagnosis not present

## 2018-09-03 DIAGNOSIS — D696 Thrombocytopenia, unspecified: Secondary | ICD-10-CM

## 2018-09-03 DIAGNOSIS — D72819 Decreased white blood cell count, unspecified: Secondary | ICD-10-CM

## 2018-09-03 DIAGNOSIS — K7460 Unspecified cirrhosis of liver: Secondary | ICD-10-CM

## 2018-09-03 DIAGNOSIS — C259 Malignant neoplasm of pancreas, unspecified: Secondary | ICD-10-CM | POA: Insufficient documentation

## 2018-09-03 DIAGNOSIS — Z79899 Other long term (current) drug therapy: Secondary | ICD-10-CM

## 2018-09-03 DIAGNOSIS — F1011 Alcohol abuse, in remission: Secondary | ICD-10-CM | POA: Insufficient documentation

## 2018-09-03 DIAGNOSIS — K869 Disease of pancreas, unspecified: Secondary | ICD-10-CM

## 2018-09-03 NOTE — Telephone Encounter (Signed)
Printed calendar and avs. °

## 2018-09-03 NOTE — Telephone Encounter (Signed)
Dr Jacobs please review  

## 2018-09-06 ENCOUNTER — Encounter: Payer: Self-pay | Admitting: Gastroenterology

## 2018-09-06 ENCOUNTER — Telehealth: Payer: Self-pay | Admitting: *Deleted

## 2018-09-06 NOTE — Telephone Encounter (Signed)
FYI Patient scheduled for GI evaluation 09-12-2018 at 10:00 am.  "Mark Foster [phone]) Dr. Maylon Peppers referred me to GI but I have not heard anything from anyone." Confirmed referral ordered 08-21-2018 and receipt by [facility].  Vincente Liberty will call patient to schedule.

## 2018-09-08 NOTE — Progress Notes (Signed)
I agree with the above plan 

## 2018-09-09 ENCOUNTER — Telehealth: Payer: Self-pay

## 2018-09-09 ENCOUNTER — Telehealth: Payer: Self-pay | Admitting: Student

## 2018-09-09 ENCOUNTER — Other Ambulatory Visit: Payer: Self-pay

## 2018-09-09 DIAGNOSIS — K869 Disease of pancreas, unspecified: Secondary | ICD-10-CM

## 2018-09-09 NOTE — Telephone Encounter (Signed)
Received message from Venancio Poisson, NP regarding patient questions (DAPT continuation and imaging follow-up).  Discussed case with Dr. Estanislado Pandy.  Called patient at 1151. Informed patient that his follow-up imaging will be a CTA head/neck (with contrast) 6 months from 07/16/2018. Informed patient that our schedulers will call him to set up this imaging scan. Informed patient that Dr. Estanislado Pandy recommends patient continues to take Brilinta 90 mg twice daily and Aspirin 81 mg once daily until after this imaging scan. Based on these results, we will determine his DAPT duration.  All questions answered and concerns addressed. Patient conveys understanding and agrees with plan.  Bea Graff Susano Cleckler, PA-C 09/09/2018, 1:09 PM

## 2018-09-09 NOTE — Telephone Encounter (Signed)
Louk, Bea Graff, PA-C  Timothy Lasso, RN        Delorise Jackson,   I reviewed this patient's case with Dr. Estanislado Pandy. He states that as long as the benefits of the endoscopic procedure outweigh the risks of stopping Brilinta, than Brilinta can be stopped 5 days prior to procedure and restarted the day following procedure. However, patient must continue taking Aspirin 81 mg once daily. Please let me know if you have any questions.   Thank you!  Namon Cirri, PA-C

## 2018-09-09 NOTE — Telephone Encounter (Signed)
I was out of town last week.  Mark Foster, He needs first available upper EUS appointment with MAC sedation with myself or Dr. Rush Landmark for pancreatic mass.  He is on brilinta and that will need to be held for 5 days prior to the procedure.  Thanks

## 2018-09-09 NOTE — Progress Notes (Signed)
Thank you! I appreciate your assistance! Keane Scrape, NP

## 2018-09-09 NOTE — Telephone Encounter (Signed)
EUS scheduled, pt instructed and medications reviewed.  Patient instructions mailed to home.  Patient to call with any questions or concerns. Anti coag letter sent to Dr Estanislado Pandy

## 2018-09-10 NOTE — Telephone Encounter (Signed)
The patient has been notified of this information and all questions answered. The pt has been advised of the information and verbalized understanding.    

## 2018-09-12 ENCOUNTER — Ambulatory Visit: Payer: BLUE CROSS/BLUE SHIELD | Admitting: Gastroenterology

## 2018-09-20 ENCOUNTER — Telehealth: Payer: Self-pay | Admitting: *Deleted

## 2018-09-20 ENCOUNTER — Telehealth: Payer: Self-pay | Admitting: Hematology

## 2018-09-20 NOTE — Telephone Encounter (Signed)
R/s appt per 1/17 sch message - pt is aware of appt date and time

## 2018-09-20 NOTE — Telephone Encounter (Signed)
Received TC from patient requesting to re-schedule his lab and MD appt with Dr. Maylon Peppers to a date after 10/03/18. He is seeing GI doctor @  [facility] on the 30th. His appt with Dr. Maylon Peppers needs to be after that appt.  Scheduling message sent

## 2018-10-01 ENCOUNTER — Ambulatory Visit: Payer: BLUE CROSS/BLUE SHIELD | Admitting: Hematology

## 2018-10-01 ENCOUNTER — Other Ambulatory Visit: Payer: BLUE CROSS/BLUE SHIELD

## 2018-10-02 ENCOUNTER — Encounter (HOSPITAL_COMMUNITY): Payer: Self-pay | Admitting: *Deleted

## 2018-10-02 ENCOUNTER — Other Ambulatory Visit: Payer: Self-pay

## 2018-10-02 NOTE — Progress Notes (Signed)
Spoke with patient via telephone for pre procedure interview. NPO after MN. Patient to take Gabapentin, NP Thyroid and Zoloft AM of procedure with a sip of water.Mark Foster will be driver. Arrival time 0800.

## 2018-10-02 NOTE — Anesthesia Preprocedure Evaluation (Addendum)
Anesthesia Evaluation  Patient identified by MRN, date of birth, ID band Patient awake    Reviewed: Allergy & Precautions, NPO status , Patient's Chart, lab work & pertinent test results  Airway Mallampati: II  TM Distance: >3 FB Neck ROM: Full    Dental no notable dental hx. (+) Lower Dentures, Upper Dentures   Pulmonary neg pulmonary ROS,    Pulmonary exam normal breath sounds clear to auscultation       Cardiovascular hypertension, Normal cardiovascular exam Rhythm:Regular Rate:Normal     Neuro/Psych RLE weakness CVA, Residual Symptoms    GI/Hepatic Hx of pancreatic lesion   Endo/Other  diabetes, Well Controlled, Type 1Hypothyroidism   Renal/GU negative Renal ROS     Musculoskeletal   Abdominal   Peds  Hematology   Anesthesia Other Findings   Reproductive/Obstetrics                           Anesthesia Physical Anesthesia Plan  ASA: III  Anesthesia Plan: MAC   Post-op Pain Management:    Induction: Intravenous  PONV Risk Score and Plan: Treatment may vary due to age or medical condition  Airway Management Planned: Nasal Cannula and Natural Airway  Additional Equipment:   Intra-op Plan:   Post-operative Plan:   Informed Consent: I have reviewed the patients History and Physical, chart, labs and discussed the procedure including the risks, benefits and alternatives for the proposed anesthesia with the patient or authorized representative who has indicated his/her understanding and acceptance.     Dental advisory given  Plan Discussed with: CRNA  Anesthesia Plan Comments: (Upper endoscopic ultrasound)       Anesthesia Quick Evaluation

## 2018-10-03 ENCOUNTER — Other Ambulatory Visit: Payer: Self-pay | Admitting: Hematology

## 2018-10-03 ENCOUNTER — Telehealth: Payer: Self-pay

## 2018-10-03 ENCOUNTER — Ambulatory Visit (HOSPITAL_COMMUNITY): Payer: PRIVATE HEALTH INSURANCE | Admitting: Anesthesiology

## 2018-10-03 ENCOUNTER — Encounter (HOSPITAL_COMMUNITY): Payer: Self-pay | Admitting: Anesthesiology

## 2018-10-03 ENCOUNTER — Other Ambulatory Visit: Payer: Self-pay

## 2018-10-03 ENCOUNTER — Ambulatory Visit (HOSPITAL_COMMUNITY)
Admission: RE | Admit: 2018-10-03 | Discharge: 2018-10-03 | Disposition: A | Discharge status: Home / Self Care | Payer: PRIVATE HEALTH INSURANCE | Attending: Gastroenterology | Admitting: Gastroenterology

## 2018-10-03 ENCOUNTER — Encounter (HOSPITAL_COMMUNITY)
Admission: RE | Disposition: A | Discharge status: Home / Self Care | Payer: Self-pay | Source: Home / Self Care | Attending: Gastroenterology

## 2018-10-03 DIAGNOSIS — D72819 Decreased white blood cell count, unspecified: Secondary | ICD-10-CM | POA: Diagnosis not present

## 2018-10-03 DIAGNOSIS — Z79890 Hormone replacement therapy (postmenopausal): Secondary | ICD-10-CM | POA: Insufficient documentation

## 2018-10-03 DIAGNOSIS — K869 Disease of pancreas, unspecified: Secondary | ICD-10-CM | POA: Diagnosis not present

## 2018-10-03 DIAGNOSIS — C259 Malignant neoplasm of pancreas, unspecified: Secondary | ICD-10-CM

## 2018-10-03 DIAGNOSIS — R933 Abnormal findings on diagnostic imaging of other parts of digestive tract: Secondary | ICD-10-CM | POA: Diagnosis present

## 2018-10-03 DIAGNOSIS — Z885 Allergy status to narcotic agent status: Secondary | ICD-10-CM | POA: Diagnosis not present

## 2018-10-03 DIAGNOSIS — Z794 Long term (current) use of insulin: Secondary | ICD-10-CM | POA: Insufficient documentation

## 2018-10-03 DIAGNOSIS — Z800 Family history of malignant neoplasm of digestive organs: Secondary | ICD-10-CM | POA: Insufficient documentation

## 2018-10-03 DIAGNOSIS — Z7982 Long term (current) use of aspirin: Secondary | ICD-10-CM | POA: Insufficient documentation

## 2018-10-03 DIAGNOSIS — C251 Malignant neoplasm of body of pancreas: Secondary | ICD-10-CM | POA: Insufficient documentation

## 2018-10-03 DIAGNOSIS — F1010 Alcohol abuse, uncomplicated: Secondary | ICD-10-CM | POA: Insufficient documentation

## 2018-10-03 DIAGNOSIS — D696 Thrombocytopenia, unspecified: Secondary | ICD-10-CM | POA: Insufficient documentation

## 2018-10-03 DIAGNOSIS — Z79899 Other long term (current) drug therapy: Secondary | ICD-10-CM | POA: Diagnosis not present

## 2018-10-03 DIAGNOSIS — E039 Hypothyroidism, unspecified: Secondary | ICD-10-CM | POA: Diagnosis not present

## 2018-10-03 HISTORY — PX: EUS: SHX5427

## 2018-10-03 HISTORY — PX: ESOPHAGOGASTRODUODENOSCOPY: SHX5428

## 2018-10-03 HISTORY — PX: FINE NEEDLE ASPIRATION: SHX5430

## 2018-10-03 LAB — GLUCOSE, CAPILLARY: Glucose-Capillary: 238 mg/dL — ABNORMAL HIGH (ref 70–99)

## 2018-10-03 SURGERY — UPPER ENDOSCOPIC ULTRASOUND (EUS) RADIAL
Anesthesia: Monitor Anesthesia Care

## 2018-10-03 MED ORDER — SODIUM CHLORIDE 0.9 % IV SOLN: INTRAVENOUS | Status: DC

## 2018-10-03 MED ORDER — PROPOFOL 10 MG/ML IV BOLUS
INTRAVENOUS | Status: DC | PRN
  Administered 2018-10-03 (×8): 20 mg via INTRAVENOUS

## 2018-10-03 MED ORDER — PROPOFOL 500 MG/50ML IV EMUL
INTRAVENOUS | Status: DC | PRN
  Administered 2018-10-03: 120 ug/kg/min via INTRAVENOUS

## 2018-10-03 MED ORDER — LIDOCAINE 2% (20 MG/ML) 5 ML SYRINGE
INTRAMUSCULAR | Status: DC | PRN
  Administered 2018-10-03: 100 mg via INTRAVENOUS

## 2018-10-03 MED ORDER — PROPOFOL 10 MG/ML IV BOLUS
INTRAVENOUS | Status: AC
  Filled 2018-10-03: qty 60

## 2018-10-03 MED ORDER — LACTATED RINGERS IV SOLN
INTRAVENOUS | Status: DC
  Administered 2018-10-03: 08:00:00 via INTRAVENOUS

## 2018-10-03 NOTE — Anesthesia Procedure Notes (Signed)
Date/Time: 10/03/2018 9:01 AM Performed by: Sharlette Dense, CRNA Oxygen Delivery Method: Nasal cannula

## 2018-10-03 NOTE — Discharge Instructions (Signed)
YOU HAD AN ENDOSCOPIC PROCEDURE TODAY: Refer to the procedure report and other information in the discharge instructions given to you for any specific questions about what was found during the examination. If this information does not answer your questions, please call [place] office at [phone] to clarify.   YOU SHOULD EXPECT: Some feelings of bloating in the abdomen. Passage of more gas than usual. Walking can help get rid of the air that was put into your GI tract during the procedure and reduce the bloating. If you had a lower endoscopy (such as a colonoscopy or flexible sigmoidoscopy) you may notice spotting of blood in your stool or on the toilet paper. Some abdominal soreness may be present for a day or two, also.  DIET: Your first meal following the procedure should be a light meal and then it is ok to progress to your normal diet. A half-sandwich or bowl of soup is an example of a good first meal. Heavy or fried foods are harder to digest and may make you feel nauseous or bloated. Drink plenty of fluids but you should avoid alcoholic beverages for 24 hours. If you had a esophageal dilation, please see attached instructions for diet.    ACTIVITY: Your care partner should take you home directly after the procedure. You should plan to take it easy, moving slowly for the rest of the day. You can resume normal activity the day after the procedure however YOU SHOULD NOT DRIVE, use power tools, machinery or perform tasks that involve climbing or major physical exertion for 24 hours (because of the sedation medicines used during the test).   SYMPTOMS TO REPORT IMMEDIATELY: A gastroenterologist can be reached at any hour. Please call [phone]  for any of the following symptoms:   Following upper endoscopy (EGD, EUS, ERCP, esophageal dilation) Vomiting of blood or coffee ground material  New, significant abdominal pain  New, significant chest pain or pain under the shoulder blades  Painful or  persistently difficult swallowing  New shortness of breath  Black, tarry-looking or red, bloody stools  FOLLOW UP:  If any biopsies were taken you will be contacted by phone or by letter within the next 1-3 weeks. Call [phone]  if you have not heard about the biopsies in 3 weeks.  Please also call with any specific questions about appointments or follow up tests.  

## 2018-10-03 NOTE — Transfer of Care (Signed)
Immediate Anesthesia Transfer of Care Note  Patient: Mark Foster  Procedure(s) Performed: UPPER ENDOSCOPIC ULTRASOUND (EUS) RADIAL (N/A ) FINE NEEDLE ASPIRATION (FNA) LINEAR (N/A )  Patient Location: Endoscopy Unit  Anesthesia Type:MAC  Level of Consciousness: drowsy  Airway & Oxygen Therapy: Patient Spontanous Breathing and Patient connected to nasal cannula oxygen  Post-op Assessment: Report given to RN and Post -op Vital signs reviewed and stable  Post vital signs: Reviewed and stable  Last Vitals:  Vitals Value Taken Time  BP    Temp    Pulse 75 10/03/2018  9:53 AM  Resp 18 10/03/2018  9:53 AM  SpO2 100 % 10/03/2018  9:53 AM  Vitals shown include unvalidated device data.  Last Pain:  Vitals:   10/03/18 0738  TempSrc: Oral  PainSc: 0-No pain         Complications: No apparent anesthesia complications

## 2018-10-03 NOTE — Op Note (Signed)
[facility] Patient Name: Mark Foster Procedure Date: 10/03/2018 MRN: [id number] Attending MD: [name] , MD Date of Birth: 12/15/1955 CSN: [id number] Age: 63 Admit Type: Outpatient Procedure:                Upper EUS Indications:              Abnormal pancreas on CT, MRI; father died of                            pancreatic cancer Providers:                [name], MD, Raynelle Bring, RN, Cleda Daub, RN, Charolette Child, Technician, Danley Danker,                            CRNA Referring MD:             Tish Men, MD Medicines:                Monitored Anesthesia Care Complications:            No immediate complications. Estimated blood loss:                            None. Estimated Blood Loss:     Estimated blood loss: none. Procedure:                Pre-Anesthesia Assessment:                           - Prior to the procedure, a History and Physical                            was performed, and patient medications and                            allergies were reviewed. The patient's tolerance of                            previous anesthesia was also reviewed. The risks                            and benefits of the procedure and the sedation                            options and risks were discussed with the patient.                            All questions were answered, and informed consent                            was obtained. Prior Anticoagulants: The patient has                            taken brilinta,  last dose was 5 days prior to                            procedure. ASA Grade Assessment: III - A patient                            with severe systemic disease. After reviewing the                            risks and benefits, the patient was deemed in                            satisfactory condition to undergo the procedure.                           After obtaining informed consent, the endoscope was                         passed under direct vision. Throughout the                            procedure, the patient's blood pressure, pulse, and                            oxygen saturations were monitored continuously. The                            [id number] ([id number]) Olympus Radial EUS was                            introduced through the mouth, and advanced to the                            second part of duodenum. The [id number] ([id number])                            Olympus Linear EUS was introduced through the                            mouth, and advanced to the second part of duodenum. Scope In: Scope Out: Findings:      ENDOSCOPIC FINDING (limited views with radial and linear       echoendoscopes): :      The examined esophagus was endoscopically normal.      The entire examined stomach was endoscopically normal.      The examined duodenum was endoscopically normal.      ENDOSONOGRAPHIC FINDING: :      1. There was an irregularly shaped heterogeneous, hypoechoic mass in the       body of the pancreas that measured 2.6cm in maximum dimension. The mass       is obstructing the main pancreatic duct with resultant downstrem       dilation of the duct to 3mm in the tail. The mass abuts the splenic       vessels but no other large vascular structures. The mass was sampled  with 3 transgastric passes with a 25guage EUS FNA needle using color       doppler to avoid significant vessels.      2. Remainder of pancreatic parenchyma was normal.      3. No perpancreatic adenopathy.      4. CBD was normal, non-dilated.      5. Gallstone in the gallbladder.      6. Limited views of the liver, spleen, portal vessels were all normal. Impression:               - 2.6cm irregularly shaped mass in the body of the                            pancreas that causing main pancreatic duct                            obstruction and dilation. The mass abuts the                            splenic  vessels but no other significant vascular                            structures and it was sampled with trangastric EUS                            FNA. Preliminary cytology is + for malignancy,                            likely well-differentiated adenocarcinoma. It                            appears surgically resectable. Moderate Sedation:      Not Applicable - Patient had care per Anesthesia. Recommendation:           - Discharge patient to home (ambulatory).                           - Await final cytology results. Procedure Code(s):        --- Professional ---                           [phone], Esophagogastroduodenoscopy, flexible,                            transoral; with transendoscopic ultrasound-guided                            intramural or transmural fine needle                            aspiration/biopsy(s), (includes endoscopic                            ultrasound examination limited to the esophagus,                            stomach or duodenum, and adjacent  structures) Diagnosis Code(s):        --- Professional ---                           K86.89, Other specified diseases of pancreas                           R59.1, Generalized enlarged lymph nodes CPT copyright 2018 American Medical Association. All rights reserved. The codes documented in this report are preliminary and upon coder review may  be revised to meet current compliance requirements. [name], MD 10/03/2018 9:58:02 AM This report has been signed electronically. Number of Addenda: 0

## 2018-10-03 NOTE — Interval H&P Note (Signed)
History and Physical Interval Note:  10/03/2018 8:55 AM  Mark Foster  has presented today for surgery, with the diagnosis of pancreatic mass  The various methods of treatment have been discussed with the patient and family. After consideration of risks, benefits and other options for treatment, the patient has consented to  Procedure(s): UPPER ENDOSCOPIC ULTRASOUND (EUS) RADIAL (N/A) as a surgical intervention .  The patient's history has been reviewed, patient examined, no change in status, stable for surgery.  I have reviewed the patient's chart and labs.  Questions were answered to the patient's satisfaction.     [name]

## 2018-10-03 NOTE — Anesthesia Postprocedure Evaluation (Signed)
Anesthesia Post Note  Patient: Mark Foster  Procedure(s) Performed: UPPER ENDOSCOPIC ULTRASOUND (EUS) RADIAL (N/A ) FINE NEEDLE ASPIRATION (FNA) LINEAR (N/A )     Patient location during evaluation: Endoscopy Anesthesia Type: MAC Level of consciousness: awake and alert Pain management: pain level controlled Vital Signs Assessment: post-procedure vital signs reviewed and stable Respiratory status: spontaneous breathing, nonlabored ventilation, respiratory function stable and patient connected to nasal cannula oxygen Cardiovascular status: blood pressure returned to baseline and stable Postop Assessment: no apparent nausea or vomiting Anesthetic complications: no    Last Vitals:  Vitals:   10/03/18 0956 10/03/18 1000  BP: 101/61 114/71  Pulse: 73 74  Resp: 16 16  Temp: 36.6 C   SpO2: 99% 100%    Last Pain:  Vitals:   10/03/18 0956  TempSrc: Oral  PainSc: 0-No pain                 Barnet Glasgow

## 2018-10-03 NOTE — Telephone Encounter (Signed)
Nurse spoke with patient to inform that Dr. Maylon Peppers has put in a order for him to get a PET scan.  Per MD, the final path is still pending.  Patient voiced understanding and had no questions at this time.

## 2018-10-04 ENCOUNTER — Encounter (HOSPITAL_COMMUNITY): Payer: Self-pay | Admitting: Gastroenterology

## 2018-10-08 ENCOUNTER — Other Ambulatory Visit: Payer: Self-pay | Admitting: Hematology

## 2018-10-08 ENCOUNTER — Telehealth: Payer: Self-pay

## 2018-10-08 DIAGNOSIS — K869 Disease of pancreas, unspecified: Secondary | ICD-10-CM

## 2018-10-08 NOTE — Telephone Encounter (Signed)
  Oncology Nurse Navigator Documentation   Called patient to remind him of initial consult with Dr. Maylon Peppers on 10/09/18. Reviewed instructions for PET scan at [facility] on 10/10/18. Arrive at 9 AM, NPO  after midnight including all medications. Patient has my direct contact information for questions or concerns.

## 2018-10-08 NOTE — Progress Notes (Signed)
[place] OFFICE PROGRESS NOTE  Patient Care Team: Doree Albee, MD as PCP - General (Internal Medicine)  HEME/ONC OVERVIEW: 1. 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 Audelia Hives -08/2018: CT abdomen/pelvis (for leukopenia/thrombocytopenia) showed an incidental pancreatic body soft tissue prominence ~2cm, atrophy and pancreatic ductal dilatation involving pancreatic tail; MRI abdomen showed a 1.8 x 1.8cm area of hypoenhancement at the junction between the pancreatic head and body  -10/03/2018: EUS showed a 2.6cm mass in the pancreatic body causing pancreatic duct obstruction and dilation; mass abuts the splenic vessels but no other vascular structure; FNA showed adenocarcinoma   2. Mild leukopenia  -WBC ~2-3k w/ ANC > 1000 since early 2019; no prior for comparison -Viral, nutritional studies unremarkable   3. Mild thrombocytopenia (plts ~100-110k's)  PERTINENT NON-HEM/ONC PROBLEMS: 1. Extensive cerebrovascular disease 2. 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 -Patient underwent EUS with bx of the pancreatic body mass on 10/03/2018, which showed adenocarcinoma -PET scheduled on 10/17/2018 -I reviewed in detail with the patient NCCN guideline on the management of early-stage pancreatic cancer  -The case was discussed at the GI tumor board, and the consensus was to proceed with surgical resection if CA 19-9 is not significantly elevated -I have referred the patient to Dr. Barry Dienes of surgical oncology  -If CA 19-9 is very elevated, it may suggest micrometastatic disease, in which case neoadjuvant chemotherapy such as FOLFIRINOX may be considered  Mild leukopenia  -Chronic; ddx includes bone marrow suppression from hx of EtOH abuse vs. early MDS  -WBC 3k with ANC 1900, stable  -Patient denies any symptoms of infection  -We will monitor it for now  Thrombocytopenia -Plts 104k today,  stable  -Patient denies any symptoms of bleeding -We will monitor it for now   Type II DM with hyperglycemia -Glucose 401 today -Patient reports eating several sandwiches from [employer] this morning; he denies any symptoms of dizziness or polyuria  -I discussed with the patient the importance of adequate glucose control in anticipation of surgery -He will contact his endocrinologist to be seen ASAP for the insulin management   Tobacco abuse counseling -Patient is currently smoking 1 pack every 3-4 days  -I spent some time counseling the patient the importance of tobacco cessation. -We discussed common strategies including nicotine patches, Tobacco Quit-line, and other nicotine replacement products to assist in the patient's effort to quit. -The patient is committed to stop smoking, and will discuss with his PCP regarding patches vs. Other pharmacologic interventions  All questions were answered. The patient knows to call the clinic with any problems, questions or concerns. No barriers to learning was detected.  A total of more than 40 minutes were spent face-to-face with the patient during this encounter and over half of that time was spent on counseling and coordination of care as outlined above.  Return in 2 weeks for clinic follow-up.   Mark Men, MD 10/09/2018 10:52 AM  CHIEF COMPLAINT: "I am doing okay"  INTERVAL HISTORY: Mark Foster returns to clinic for follow-up of newly diagnosed pancreatic adenocarcinoma.  Patient reports that he has been trying to stay active, and walked 4 miles yesterday with some mild cramping in the bilateral feet afterwards.  He denies any constitutional symptoms, chest pain, dyspnea, abdominal pain, nausea, vomiting, diarrhea.  He sees an endocrinologist for the management of his type 2 diabetes, but has not always been strict with his  diet.  His glucose was 401 this morning, and he ate several breakfast sandwiches from [employer].  He denies any  lightheadedness, polydipsia, polyuria.  He is smoking approximately 1 pack every 3 to 4 days, but is interested in quitting smoking.  He denies any other complaint today.  SUMMARY OF ONCOLOGIC HISTORY:   Pancreatic adenocarcinoma ([place])   08/20/2018 Imaging    CT abdomen/pelvis w/ contrast: IMPRESSION: Atrophy and ductal dilatation involving the pancreatic tail, with suspected small soft tissue mass in the pancreatic body which could represent pancreatic carcinoma. Abdomen MRI and MRCP without and with contrast is recommended for further evaluation.  No evidence of hepatobiliary disease.    08/30/2018 Imaging    MRI abdomen w/ contrast: IMPRESSION: 1. Although not definitive, there remains concern of a small hypoenhancing mass at the junction of the pancreatic body and tail associated with atrophy and ductal dilatation in the pancreatic tail. This remains concerning for pancreatic neoplasm. Postinflammatory stricture less likely. Besides a tiny cystic lesion in the pancreatic tail, there are no other signs of previous pancreatitis. Further evaluation with endoscopic ultrasound for possible biopsy strongly recommended. 2. No evidence of metastatic disease. 3. Cholelithiasis without evidence of cholecystitis or biliary dilatation.    09/03/2018 Initial Diagnosis    Pancreatic adenocarcinoma ([place])    10/03/2018 Procedure    EUS: - 2.6cm irregularly shaped mass in the body of the pancreas that causing main pancreatic duct obstruction and dilation. The mass abuts the splenic vessels but no other significant vascular structures and it was sampled with trangastric EUS FNA. Preliminary cytology is + for malignancy, likely well-differentiated adenocarcinoma. It appears surgically resectable.    10/03/2018 Pathology Results    Accession: [id number]  FINE NEEDLE ASPIRATION, ENDOSCOPIC, PANCREAS BODY (SPECIMEN 1 OF 1 COLLECTED 10/03/18): ADENOCARCINOMA.     REVIEW OF SYSTEMS:    Constitutional: ( - ) fevers, ( - )  chills , ( - ) night sweats Eyes: ( - ) blurriness of vision, ( - ) double vision, ( - ) watery eyes Ears, nose, mouth, throat, and face: ( - ) mucositis, ( - ) sore throat Respiratory: ( - ) cough, ( - ) dyspnea, ( - ) wheezes Cardiovascular: ( - ) palpitation, ( - ) chest discomfort, ( - ) lower extremity swelling Gastrointestinal:  ( - ) nausea, ( - ) heartburn, ( - ) change in bowel habits Skin: ( - ) abnormal skin rashes Lymphatics: ( - ) new lymphadenopathy, ( - ) easy bruising Neurological: ( - ) numbness, ( - ) tingling, ( - ) new weaknesses Behavioral/Psych: ( - ) mood change, ( - ) new changes  All other systems were reviewed with the patient and are negative.  I have reviewed the past medical history, past surgical history, social history and family history with the patient and they are unchanged from previous note.  ALLERGIES:  is allergic to demerol [meperidine hcl].  MEDICATIONS:  Current Outpatient Medications  Medication Sig Dispense Refill  . aspirin 81 MG tablet Take 1 tablet (81 mg total) by mouth daily. 30 tablet 3  . atorvastatin (LIPITOR) 80 MG tablet Take 1 tablet (80 mg total) by mouth daily at 6 PM. (Patient taking differently: Take 80 mg by mouth daily. ) 30 tablet 2  . Continuous Blood Gluc Sensor (FREESTYLE LIBRE 14 DAY SENSOR) MISC 1 Device by Does not apply route every 14 (fourteen) days. 2 each 2  . ECHINACEA EXTRACT PO Take 2 tablets by mouth daily.     Marland Kitchen  gabapentin (NEURONTIN) 300 MG capsule Take 1 capsule (300 mg total) by mouth 2 (two) times daily. 60 capsule 3  . glipiZIDE (GLUCOTROL) 10 MG tablet Take 10 mg by mouth See admin instructions. Take 10 mg in the morning, may take an additional 10 mg in the evening as needed for blood sugar over 240    . insulin degludec (TRESIBA FLEXTOUCH) 100 UNIT/ML SOPN FlexTouch Pen Inject 0.2 mLs (20 Units total) into the skin daily. 5 pen 2  . Insulin Pen Needle 32G X 4 MM MISC 1  Device by Does not apply route daily. 50 each 2  . NP THYROID 30 MG tablet Take 30 mg by mouth daily before breakfast.   2  . sertraline (ZOLOFT) 50 MG tablet Take 50 mg by mouth every morning.     . ticagrelor (BRILINTA) 90 MG TABS tablet Take 90 mg by mouth 2 (two) times daily.     No current facility-administered medications for this visit.     PHYSICAL EXAMINATION: ECOG PERFORMANCE STATUS: 0 - Asymptomatic  Today's Vitals   10/09/18 1017 10/09/18 1019  BP: 119/75   Pulse: 95   Resp: 18   Temp: 97.7 F (36.5 C)   TempSrc: Oral   SpO2: 100%   Weight: 155 lb 14.4 oz (70.7 kg)   PainSc:  3    Body mass index is 22.37 kg/m.  Filed Weights   10/09/18 1017  Weight: 155 lb 14.4 oz (70.7 kg)    GENERAL: alert, no distress and comfortable, slightly thin SKIN: skin color, texture, turgor are normal, no rashes or significant lesions EYES: conjunctiva are pink and non-injected, sclera clear OROPHARYNX: no exudate, no erythema; lips, buccal mucosa, and tongue normal  NECK: supple, non-tender LUNGS: clear to auscultation with normal breathing effort HEART: regular rate & rhythm and no murmurs and no lower extremity edema ABDOMEN: soft, non-tender, non-distended, normal bowel sounds Musculoskeletal: no cyanosis of digits and no clubbing  PSYCH: alert & oriented x 3, fluent speech NEURO: no focal motor/sensory deficits  LABORATORY DATA:  I have reviewed the data as listed    Component Value Date/Time   NA 138 10/09/2018 0955   K 4.1 10/09/2018 0955   CL 104 10/09/2018 0955   CO2 25 10/09/2018 0955   GLUCOSE 401 (H) 10/09/2018 0955   BUN 18 10/09/2018 0955   CREATININE 0.89 10/09/2018 0955   CALCIUM 9.3 10/09/2018 0955   PROT 6.8 08/20/2018 1224   ALBUMIN 4.1 08/20/2018 1224   AST 22 08/20/2018 1224   ALT 23 08/20/2018 1224   ALKPHOS 72 08/20/2018 1224   BILITOT 1.8 (H) 08/20/2018 1224   GFRNONAA >60 10/09/2018 0955   GFRAA >60 10/09/2018 0955    No results found  for: SPEP, UPEP  Lab Results  Component Value Date   WBC 3.0 (L) 10/09/2018   NEUTROABS 1.9 10/09/2018   HGB 15.2 10/09/2018   HCT 41.1 10/09/2018   MCV 90.3 10/09/2018   PLT 104 (L) 10/09/2018      Chemistry      Component Value Date/Time   NA 138 10/09/2018 0955   K 4.1 10/09/2018 0955   CL 104 10/09/2018 0955   CO2 25 10/09/2018 0955   BUN 18 10/09/2018 0955   CREATININE 0.89 10/09/2018 0955      Component Value Date/Time   CALCIUM 9.3 10/09/2018 0955   ALKPHOS 72 08/20/2018 1224   AST 22 08/20/2018 1224   ALT 23 08/20/2018 1224   BILITOT 1.8 (H)  08/20/2018 1224      

## 2018-10-09 ENCOUNTER — Telehealth: Payer: Self-pay | Admitting: Hematology

## 2018-10-09 ENCOUNTER — Other Ambulatory Visit: Payer: Self-pay | Admitting: Hematology

## 2018-10-09 ENCOUNTER — Encounter: Payer: Self-pay | Admitting: Hematology

## 2018-10-09 ENCOUNTER — Inpatient Hospital Stay (HOSPITAL_BASED_OUTPATIENT_CLINIC_OR_DEPARTMENT_OTHER): Payer: PRIVATE HEALTH INSURANCE | Admitting: Hematology

## 2018-10-09 ENCOUNTER — Inpatient Hospital Stay: Payer: PRIVATE HEALTH INSURANCE | Attending: Hematology

## 2018-10-09 ENCOUNTER — Telehealth: Payer: Self-pay | Admitting: *Deleted

## 2018-10-09 VITALS — BP 119/75 | HR 95 | Temp 97.7°F | Resp 18 | Wt 155.9 lb

## 2018-10-09 DIAGNOSIS — Z7982 Long term (current) use of aspirin: Secondary | ICD-10-CM | POA: Insufficient documentation

## 2018-10-09 DIAGNOSIS — D72819 Decreased white blood cell count, unspecified: Secondary | ICD-10-CM | POA: Insufficient documentation

## 2018-10-09 DIAGNOSIS — F17210 Nicotine dependence, cigarettes, uncomplicated: Secondary | ICD-10-CM

## 2018-10-09 DIAGNOSIS — E1165 Type 2 diabetes mellitus with hyperglycemia: Secondary | ICD-10-CM | POA: Insufficient documentation

## 2018-10-09 DIAGNOSIS — D696 Thrombocytopenia, unspecified: Secondary | ICD-10-CM | POA: Insufficient documentation

## 2018-10-09 DIAGNOSIS — C259 Malignant neoplasm of pancreas, unspecified: Secondary | ICD-10-CM | POA: Diagnosis not present

## 2018-10-09 DIAGNOSIS — Z794 Long term (current) use of insulin: Secondary | ICD-10-CM

## 2018-10-09 DIAGNOSIS — K869 Disease of pancreas, unspecified: Secondary | ICD-10-CM

## 2018-10-09 DIAGNOSIS — R739 Hyperglycemia, unspecified: Secondary | ICD-10-CM

## 2018-10-09 DIAGNOSIS — Z79899 Other long term (current) drug therapy: Secondary | ICD-10-CM | POA: Insufficient documentation

## 2018-10-09 DIAGNOSIS — C251 Malignant neoplasm of body of pancreas: Secondary | ICD-10-CM | POA: Insufficient documentation

## 2018-10-09 DIAGNOSIS — Z716 Tobacco abuse counseling: Secondary | ICD-10-CM

## 2018-10-09 LAB — CBC WITH DIFFERENTIAL (CANCER CENTER ONLY)
Abs Immature Granulocytes: 0 10*3/uL (ref 0.00–0.07)
Basophils Absolute: 0 10*3/uL (ref 0.0–0.1)
Basophils Relative: 0 %
Eosinophils Absolute: 0 10*3/uL (ref 0.0–0.5)
Eosinophils Relative: 1 %
HCT: 41.1 % (ref 39.0–52.0)
Hemoglobin: 15.2 g/dL (ref 13.0–17.0)
Immature Granulocytes: 0 %
Lymphocytes Relative: 31 %
Lymphs Abs: 0.9 10*3/uL (ref 0.7–4.0)
MCH: 33.4 pg (ref 26.0–34.0)
MCHC: 37 g/dL — ABNORMAL HIGH (ref 30.0–36.0)
MCV: 90.3 fL (ref 80.0–100.0)
Monocytes Absolute: 0.2 10*3/uL (ref 0.1–1.0)
Monocytes Relative: 5 %
Neutro Abs: 1.9 10*3/uL (ref 1.7–7.7)
Neutrophils Relative %: 63 %
Platelet Count: 104 10*3/uL — ABNORMAL LOW (ref 150–400)
RBC: 4.55 MIL/uL (ref 4.22–5.81)
RDW: 12.7 % (ref 11.5–15.5)
WBC Count: 3 10*3/uL — ABNORMAL LOW (ref 4.0–10.5)
nRBC: 0 % (ref 0.0–0.2)

## 2018-10-09 LAB — BASIC METABOLIC PANEL - CANCER CENTER ONLY
Anion gap: 9 (ref 5–15)
BUN: 18 mg/dL (ref 8–23)
CO2: 25 mmol/L (ref 22–32)
Calcium: 9.3 mg/dL (ref 8.9–10.3)
Chloride: 104 mmol/L (ref 98–111)
Creatinine: 0.89 mg/dL (ref 0.61–1.24)
GFR, Est AFR Am: >60 mL/min (ref >60)
GFR, Estimated: >60 mL/min (ref >60)
Glucose, Bld: 401 mg/dL — ABNORMAL HIGH (ref 70–99)
Potassium: 4.1 mmol/L (ref 3.5–5.1)
Sodium: 138 mmol/L (ref 135–145)

## 2018-10-09 LAB — HEPATIC FUNCTION PANEL
ALT: 19 U/L (ref 0–44)
AST: 16 U/L (ref 15–41)
Albumin: 4.4 g/dL (ref 3.5–5.0)
Alkaline Phosphatase: 66 U/L (ref 38–126)
Bilirubin, Direct: 0.2 mg/dL (ref 0.0–0.2)
Indirect Bilirubin: 0.9 mg/dL (ref 0.3–0.9)
Total Bilirubin: 1.1 mg/dL (ref 0.3–1.2)
Total Protein: 7.1 g/dL (ref 6.5–8.1)

## 2018-10-09 NOTE — Telephone Encounter (Signed)
Spoke with Mardene Celeste, triage RN @ [facility] about pt's referral to Dr. Barry Dienes.  Faxed referral order along with office notes from today to Clarise Cruz, referral coordinator - with notes that pt needs appt with Dr. Barry Dienes  AS[facility]   Fax    [phone].

## 2018-10-09 NOTE — Telephone Encounter (Signed)
PET Authorization approved until  11/08/2018  -   [id number] .

## 2018-10-09 NOTE — Telephone Encounter (Signed)
ERROR

## 2018-10-09 NOTE — Patient Instructions (Signed)
Pancreatic Cancer       Pancreatic cancer is an abnormal growth of cells (tumor) in the pancreas that is cancerous (malignant). The pancreas is a gland located in the abdomen, between the stomach and the spine. The pancreas makes hormones, including insulin and glucagon. These hormones control your blood sugar and help your body use and store energy that comes from food. The pancreas also makes pancreatic juices that help digest food.  There are two types of pancreatic cancer:  · Exocrine. This is the most common type.  · Endocrine.  The different types of cancer have their own causes, risk factors, and treatment. Pancreatic cancer can spread (metastasize) to other parts of the body.  What are the causes?  The exact cause of this condition is not known.  What increases the risk?  The following factors may make you more likely to develop this condition:  · Age. The risk of pancreatic cancer increases with age. This condition most commonly occurs in people over the age of 65.  · Tobacco use, including cigarettes, chewing tobacco, or e-cigarettes.  · Being male.  · Being African American.  · Having a family history of cancer of the pancreas, colon, or ovaries.  · Having diabetes, especially if you were diagnosed as an adult.  · Having chronic pancreatitis.  · Being exposed to certain chemicals.  · Being obese and having a decreased level of physical activity.  · Eating a diet that is high in fat and red meat.  · Having an infection in your stomach caused by a strain of bacteria (Helicobacter pylori or H. pylori). This infection can lead to ulcers.  · Having certain hereditary conditions or gene mutations.  · Cirrhosis. This is the damage and scarring of the liver, which may be caused by hepatitis or heavy alcohol use.  What are the signs or symptoms?  In the early stages, there are often no symptoms of this condition. As the cancer gets worse (progresses), symptoms may vary depending on the type of pancreatic cancer  you have. Symptoms include:  · Yellowing of the skin or eyes (jaundice).  · Weakness.  · Abdominal pain.  · Diarrhea.  · Depression.  · Loss of appetite and weight loss.  · Indigestion.  · Pain in the upper abdomen or upper back.  · A lump under the rib cage on the right side.  · Nausea and vomiting.  · Fatigue.  · Stools that are light-colored and greasy-looking, or stools that are black and tarry-looking.  · Dark urine.  · High blood sugar (hyperglycemia). This may cause increased thirst and frequent urination.  · Low blood sugar (hypoglycemia). This may cause confusion, sweating, and a fast heartbeat.  · Itchy skin.  · Skin that feels uneven.  How is this diagnosed?  This condition may be diagnosed based on your medical history and a physical exam. This may include checking your skin and eyes for signs of jaundice and checking your abdomen for any changes in the areas near the pancreas. Your health care provider will also check for a collection of excess fluid in the abdomen (ascites). Tests will also be done, such as:  · Blood tests.  · Urine tests.  · Ultrasound.  · CT scan.  · MRI.  · Removal of a sample of pancreatic tissue to be examined under a microscope (biopsy).  Other tests and procedures may also be done. If pancreatic cancer is confirmed, it will be staged to determine its severity   and extent. Staging is an assessment of:  · The size of the tumor.  · Whether the cancer has spread.  · Where the cancer has spread.  How is this treated?  Depending on the type and stage of your pancreatic cancer, treatment may include:  · Surgery to remove all or part of the pancreas, or to remove a tumor.  · Radiation therapy. This is the use of high-energy rays to kill cancer cells.  · Chemotherapy. This is the use of medicines to stop or slow the growth of cancer cells.  · Targeted therapy. This treatment targets specific parts of cancer cells and the area around them to block the growth and spread of the cancer.  Your  health care provider may recommend a combination of surgery, radiation therapy, and chemotherapy. You may be referred to a health care provider who specializes in cancer (oncologist).  Follow these instructions at home:  Medicines  · Take over-the-counter and prescription medicines only as told by your health care provider.  · Do not drive or operate heavy machinery while taking prescription pain medicine.  General instructions  · Return to your normal activities as told by your health care provider. Ask your health care provider what activities are safe for you.  · Do not use tobacco products, including cigarettes, chewing tobacco, or e-cigarettes. If you need help quitting, ask your health care provider.  · Maintain a healthy diet.  · Drink enough fluid to keep your urine clear or pale yellow.  · Consider joining a support group. This may help you learn to cope with the stress of having pancreatic cancer.  · Work with your health care provider to manage any side effects of your treatment.  · Keep all follow-up visits as told by your health care provider. This is important.  Contact a health care provider if:  · You feel nauseous or you vomit.  · You have unexplained weight loss.  Get help right away if:  · You have pain that suddenly gets worse.  · You have chest pain or an irregular heartbeat.  · You have trouble breathing. You cannot eat or drink without vomiting.  · You have blood in your vomit or dark, tarry stools.  · Your skin or eyes turn more yellow.  · You develop new fatigue or weakness.  · You have abdominal bloating or pain.  This information is not intended to replace advice given to you by your health care provider. Make sure you discuss any questions you have with your health care provider.  Document Released: 08/05/2004 Document Revised: 01/27/2016 Document Reviewed: 08/02/2015  Elsevier Interactive Patient Education © 2019 [employer]   

## 2018-10-09 NOTE — Telephone Encounter (Signed)
Scheduled appt per 02/05 los. ° °Printed calendar and avs. °

## 2018-10-09 NOTE — Telephone Encounter (Signed)
Called and LMVM for patient regarding PET scan cancelled for 2/6 and r/s for 2/13 date/time/location -[facility]/ NPO 6 hours only H2O/NO CARBS day before/ HOLD INSULIN 6 hours prior also.  I advised him to call me directly should he have any questions

## 2018-10-10 LAB — CANCER ANTIGEN 19-9: CA 19-9: 60 U/mL — ABNORMAL HIGH (ref 0–35)

## 2018-10-14 ENCOUNTER — Telehealth: Payer: Self-pay

## 2018-10-14 DIAGNOSIS — C259 Malignant neoplasm of pancreas, unspecified: Secondary | ICD-10-CM

## 2018-10-14 NOTE — Telephone Encounter (Signed)
Patient called stating that he is overwhelmed and anxious trying to figure out all of his appointments. Appointments for PET, Dr. Barry Dienes and Dr. Maylon Peppers reviewed. Patient states that he is on disability and is concerned about how far his money will stretch. I encouraged him that once treatment plan is in place, he will meet with a financial advocate. Patient encouraged to call with further questions or concerns. Referral to social work placed for added support.

## 2018-10-17 ENCOUNTER — Ambulatory Visit
Admission: RE | Admit: 2018-10-17 | Discharge: 2018-10-17 | Disposition: A | Discharge status: Home / Self Care | Payer: PRIVATE HEALTH INSURANCE | Source: Ambulatory Visit | Attending: Hematology | Admitting: Hematology

## 2018-10-17 DIAGNOSIS — C259 Malignant neoplasm of pancreas, unspecified: Secondary | ICD-10-CM | POA: Insufficient documentation

## 2018-10-17 LAB — GLUCOSE, CAPILLARY: Glucose-Capillary: 126 mg/dL — ABNORMAL HIGH (ref 70–99)

## 2018-10-17 MED ORDER — FLUDEOXYGLUCOSE F - 18 (FDG) INJECTION
8.0300 | Freq: Once | INTRAVENOUS | Status: AC | PRN
  Administered 2018-10-17: 8.03 via INTRAVENOUS

## 2018-10-18 ENCOUNTER — Telehealth: Payer: Self-pay | Admitting: General Practice

## 2018-10-18 NOTE — Telephone Encounter (Signed)
[facility] CSW Progress Notes  Called patient at request of nurse navigator, [employer].  States is "all good" now, frustrated that insurance company wants him to switch to in network provider.  Concerned that this could mean treatment delays.  Trusts current treatment team and regimen under discussion.  Discussed referral to [facility] for more help in working [employer].  Is on limited income, some financial concerns.  Will send information on Margy Clarks for help w cancer support for [facility] residents, also mentioned resources available at [facility] support services.  Edwyna Shell, LCSW Clinical Social Worker Phone:  [phone]

## 2018-10-21 ENCOUNTER — Other Ambulatory Visit: Payer: Self-pay | Admitting: General Surgery

## 2018-10-22 ENCOUNTER — Telehealth: Payer: Self-pay | Admitting: *Deleted

## 2018-10-22 NOTE — Telephone Encounter (Signed)
Spoke with pt and informed pt of PET results as per Dr. Lorette Ang instructions.  Informed pt that Dr. Maylon Peppers will discuss more about the results at office visit on 10/23/18.  Pt voiced understanding.

## 2018-10-22 NOTE — Telephone Encounter (Signed)
-----   Message from Tish Men, MD sent at 10/17/2018  4:56 PM EST ----- Can you let Mr. Critzer know that his PET did not show any evidence of lymph node or metastatic disease?  Thanks.  [place]  ----- Message ----- From: Interface, Rad Results In Sent: 10/17/2018   1:18 PM EST To: Tish Men, MD

## 2018-10-23 ENCOUNTER — Encounter: Payer: Self-pay | Admitting: Hematology

## 2018-10-23 ENCOUNTER — Other Ambulatory Visit: Payer: Self-pay

## 2018-10-23 ENCOUNTER — Telehealth: Payer: Self-pay | Admitting: Hematology

## 2018-10-23 ENCOUNTER — Inpatient Hospital Stay (HOSPITAL_BASED_OUTPATIENT_CLINIC_OR_DEPARTMENT_OTHER): Payer: PRIVATE HEALTH INSURANCE | Admitting: Hematology

## 2018-10-23 VITALS — BP 116/74 | HR 80 | Temp 98.4°F | Resp 17 | Ht 70.0 in | Wt 157.8 lb

## 2018-10-23 DIAGNOSIS — C259 Malignant neoplasm of pancreas, unspecified: Secondary | ICD-10-CM

## 2018-10-23 DIAGNOSIS — D72819 Decreased white blood cell count, unspecified: Secondary | ICD-10-CM | POA: Diagnosis not present

## 2018-10-23 DIAGNOSIS — Z716 Tobacco abuse counseling: Secondary | ICD-10-CM

## 2018-10-23 DIAGNOSIS — E1165 Type 2 diabetes mellitus with hyperglycemia: Secondary | ICD-10-CM

## 2018-10-23 DIAGNOSIS — Z7982 Long term (current) use of aspirin: Secondary | ICD-10-CM

## 2018-10-23 DIAGNOSIS — F17210 Nicotine dependence, cigarettes, uncomplicated: Secondary | ICD-10-CM

## 2018-10-23 DIAGNOSIS — E119 Type 2 diabetes mellitus without complications: Secondary | ICD-10-CM

## 2018-10-23 DIAGNOSIS — D696 Thrombocytopenia, unspecified: Secondary | ICD-10-CM

## 2018-10-23 DIAGNOSIS — Z79899 Other long term (current) drug therapy: Secondary | ICD-10-CM

## 2018-10-23 DIAGNOSIS — C251 Malignant neoplasm of body of pancreas: Secondary | ICD-10-CM | POA: Diagnosis not present

## 2018-10-23 DIAGNOSIS — Z794 Long term (current) use of insulin: Secondary | ICD-10-CM

## 2018-10-23 MED ORDER — PNEUMOCOCCAL 13-VAL CONJ VACC IM SUSP
0.5000 mL | Freq: Once | INTRAMUSCULAR | Status: AC
  Administered 2018-10-23: 0.5 mL via INTRAMUSCULAR
  Filled 2018-10-23: qty 0.5

## 2018-10-23 MED ORDER — MENINGOCOCCAL A C Y&W-135 OLIG IM SOLR
0.5000 mL | Freq: Once | INTRAMUSCULAR | Status: AC
  Administered 2018-10-23: 0.5 mL via INTRAMUSCULAR
  Filled 2018-10-23: qty 0.5

## 2018-10-23 MED ORDER — HAEMOPHILUS B POLYSAC CONJ VAC IM SOLR
0.5000 mL | Freq: Once | INTRAMUSCULAR | Status: AC
  Administered 2018-10-23: 0.5 mL via INTRAMUSCULAR
  Filled 2018-10-23: qty 0.5

## 2018-10-23 NOTE — Progress Notes (Signed)
[name] OFFICE PROGRESS NOTE  Patient Care Team: Mark Albee, MD as PCP - General (Internal Medicine)  HEME/ONC OVERVIEW: 1. 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 -Previous patient of Mark Foster -08/2018: CT abdomen/pelvis (for leukopenia/thrombocytopenia) showed an incidental pancreatic body soft tissue prominence ~2cm, atrophy and pancreatic ductal dilatation involving pancreatic tail; MRI abdomen showed a 1.8 x 1.8cm area of hypoenhancement at the junction between the pancreatic head and body  -10/03/2018: EUS showed a 2.6cm mass in the pancreatic body causing pancreatic duct obstruction and dilation; mass abuts the splenic vessels but no other vascular structure; FNA showed adenocarcinoma  -10/2018: PET showed a small hypermetabolic focus within the body of pancreas, no other FDG-avid disease   2. Mild leukopenia  -WBC ~2-3k w/ ANC > 1000 since early 2019; no prior for comparison -Viral, nutritional studies unremarkable   3. Mild thrombocytopenia (plts ~100-110k's)  PERTINENT NON-HEM/ONC PROBLEMS: 1. Extensive cerebrovascular disease 2. 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 -I independently reviewed the radiologic images of recent PET, and agree with the findings document -In summary, PET in 10/2018 showed small area of FDG avidity within the pancreatic body, consistent with biopsy-proven pancreatic adenocarcinoma; there was no evidence of local or distant metastatic disease -I reviewed the PET images and results in detail with the patient -I also discussed with the patient at length regarding the diagnosis, treatment options, and prognosis of the disease  -Given that the adenocarcinoma appears to be confined to the pancreas, and CA19-9 is only mildly elevated, upfront surgical resection is appropriate, followed by adjuvant therapy based on the final pathology  -I discussed the case at length  with Mark Foster of surgical oncology, who indicated that the plan is for open subtotal pancreatectomy with splenectomy, pending neurology recommendations on anti-platelet therapy; date of the surgery to be determined -In anticipation of splenectomy, I have ordered pre-splenectomy vaccinations, including Prevnar, Hib and N. meningococcus; he will also require 37-month 358-monthnd 5-year vaccinations post-splenectomy  -Genetics referral made today  Type II DM  -Patient is followed by endocrinology  -I encouraged the patient to follow up with endocrinology for the adjustment of his insulin regimen in anticipation of surgical resection  Tobacco abuse counseling -I have had several discussions with the patient regarding the importance of tobacco cessation, especially as surgery is planned to remove the primary malignancy -He quit smoking in early 10/2018 -I congratulated the patient on smoking cessation and counseled him on the importance of abstinence from tobacco products   Orders Placed This Encounter  Procedures  . CBC with Differential (Cancer Center Only)    Standing Status:   Future    Standing Expiration Date:   11/27/2019  . CMP ([facility])    Standing Status:   Future    Standing Expiration Date:   11/27/2019  . CA 19.9    Standing Status:   Future    Standing Expiration Date:   10/23/2019  . Ambulatory referral to Genetics    Referral Priority:   Routine    Referral Type:   Consultation    Referral Reason:   Specialty Services Required    Number of Visits Requested:   1   All questions were answered. The patient knows to call the clinic with any problems, questions or concerns. No barriers to learning was detected.  A total of more than 40 minutes were spent face-to-face with the  patient during this encounter and over half of that time was spent on counseling and coordination of care as outlined above.  Return in 3 weeks for labs and clinic follow-up on adjuvant therapy  options.   Mark Men, MD 10/23/2018 4:00 PM  CHIEF COMPLAINT: "I am hanging in there"  INTERVAL HISTORY: Mark Foster returns to clinic for follow-up pancreatic cancer.  Patient reports that he met with Mark Foster recently, and discussed with her at length regarding the surgical resection, his rationale, and some of its potential complications.  He received a phone call from his neurologist that he was okay to hold his Brilinta for the surgery.  He has been under a lot of stress since being diagnosed with pancreatic cancer and is nervous about moving forward with the surgery, but also understands that surgical resection is a an integral part of the treatment plan.  He quit smoking approximately 3 weeks ago without any pharmacologic aid, and has not resumed tobacco use since then.  He also met with his endocrinologist for the management of his diabetes, and has had his insulin regimen adjusted.  He denies any complaint today.  SUMMARY OF ONCOLOGIC HISTORY:   Pancreatic adenocarcinoma (Mark Foster)   08/20/2018 Imaging    CT abdomen/pelvis w/ contrast: IMPRESSION: Atrophy and ductal dilatation involving the pancreatic tail, with suspected small soft tissue mass in the pancreatic body which could represent pancreatic carcinoma. Abdomen MRI and MRCP without and with contrast is recommended for further evaluation.  No evidence of hepatobiliary disease.    08/30/2018 Imaging    MRI abdomen w/ contrast: IMPRESSION: 1. Although not definitive, there remains concern of a small hypoenhancing mass at the junction of the pancreatic body and tail associated with atrophy and ductal dilatation in the pancreatic tail. This remains concerning for pancreatic neoplasm. Postinflammatory stricture less likely. Besides a tiny cystic lesion in the pancreatic tail, there are no other signs of previous pancreatitis. Further evaluation with endoscopic ultrasound for possible biopsy strongly recommended. 2. No evidence  of metastatic disease. 3. Cholelithiasis without evidence of cholecystitis or biliary dilatation.    09/03/2018 Initial Diagnosis    Pancreatic adenocarcinoma (Mark Foster)    10/03/2018 Procedure    EUS: - 2.6cm irregularly shaped mass in the body of the pancreas that causing main pancreatic duct obstruction and dilation. The mass abuts the splenic vessels but no other significant vascular structures and it was sampled with trangastric EUS FNA. Preliminary cytology is + for malignancy, likely well-differentiated adenocarcinoma. It appears surgically resectable.    10/03/2018 Pathology Results    Accession: [id number]  FINE NEEDLE ASPIRATION, ENDOSCOPIC, PANCREAS BODY (SPECIMEN 1 OF 1 COLLECTED 10/03/18): ADENOCARCINOMA.    10/17/2018 Imaging    PET: IMPRESSION: 1. Tiny focus of hypermetabolism identified in the body of pancreas, adjacent to the abrupt cut off of the main pancreatic duct. No evidence for hypermetabolic metastatic disease in the neck, chest, abdomen, or pelvis. 2. Cholelithiasis. 3.  Aortic Atherosclerois (ICD10-170.0) 4. Prostatomegaly     REVIEW OF SYSTEMS:   Constitutional: ( - ) fevers, ( - )  chills , ( - ) night sweats Eyes: ( - ) blurriness of vision, ( - ) double vision, ( - ) watery eyes Ears, nose, mouth, throat, and face: ( - ) mucositis, ( - ) sore throat Respiratory: ( - ) cough, ( - ) dyspnea, ( - ) wheezes Cardiovascular: ( - ) palpitation, ( - ) chest discomfort, ( - ) lower extremity swelling Gastrointestinal:  ( - )  nausea, ( - ) heartburn, ( - ) change in bowel habits Skin: ( - ) abnormal skin rashes Lymphatics: ( - ) new lymphadenopathy, ( - ) easy bruising Neurological: ( - ) numbness, ( - ) tingling, ( - ) new weaknesses Behavioral/Psych: ( - ) mood change, ( - ) new changes  All other systems were reviewed with the patient and are negative.  I have reviewed the past medical history, past surgical history, social history and family history with  the patient and they are unchanged from previous note.  ALLERGIES:  is allergic to demerol [meperidine hcl].  MEDICATIONS:  Current Outpatient Medications  Medication Sig Dispense Refill  . aspirin 81 MG tablet Take 1 tablet (81 mg total) by mouth daily. 30 tablet 3  . atorvastatin (LIPITOR) 80 MG tablet Take 1 tablet (80 mg total) by mouth daily at 6 PM. (Patient taking differently: Take 80 mg by mouth daily. ) 30 tablet 2  . Continuous Blood Gluc Sensor (FREESTYLE LIBRE 14 DAY SENSOR) MISC 1 Device by Does not apply route every 14 (fourteen) days. 2 each 2  . ECHINACEA EXTRACT PO Take 2 tablets by mouth daily.     Marland Kitchen gabapentin (NEURONTIN) 300 MG capsule Take 1 capsule (300 mg total) by mouth 2 (two) times daily. 60 capsule 3  . glipiZIDE (GLUCOTROL) 10 MG tablet Take 10 mg by mouth See admin instructions. Take 10 mg in the morning, may take an additional 10 mg in the evening as needed for blood sugar over 240    . insulin degludec (TRESIBA FLEXTOUCH) 100 UNIT/ML SOPN FlexTouch Pen Inject 0.2 mLs (20 Units total) into the skin daily. 5 pen 2  . Insulin Pen Needle 32G X 4 MM MISC 1 Device by Does not apply route daily. 50 each 2  . NP THYROID 30 MG tablet Take 30 mg by mouth daily before breakfast.   2  . sertraline (ZOLOFT) 50 MG tablet Take 50 mg by mouth every morning.     . ticagrelor (BRILINTA) 90 MG TABS tablet Take 90 mg by mouth 2 (two) times daily.     Current Facility-Administered Medications  Medication Dose Route Frequency Provider Last Rate Last Dose  . haemophilus B polysaccharide conjugate vaccine (ActHIB) injection 0.5 mL  0.5 mL Intramuscular Once Mark Men, MD      . meningococcal oligosaccharide (MENVEO) injection 0.5 mL  0.5 mL Intramuscular Once Mark Men, MD      . pneumococcal 13-valent conjugate vaccine (PREVNAR 13) injection 0.5 mL  0.5 mL Intramuscular Once Mark Men, MD        PHYSICAL EXAMINATION: ECOG PERFORMANCE STATUS: 0 - Asymptomatic  Today's Vitals    10/23/18 1516 10/23/18 1517  BP: 116/74   Pulse: 80   Resp: 17   Temp: 98.4 F (36.9 C)   TempSrc: Oral   SpO2: 99%   Weight: 157 lb 12.8 oz (71.6 kg)   Height: '5\' 10"'$  (1.778 m)   PainSc:  0-No pain   Body mass index is 22.64 kg/m.  Filed Weights   10/23/18 1516  Weight: 157 lb 12.8 oz (71.6 kg)    GENERAL: alert, no distress and comfortable, slightly thin  SKIN: skin color, texture, turgor are normal, no rashes or significant lesions EYES: conjunctiva are pink and non-injected, sclera clear OROPHARYNX: no exudate, no erythema; lips, buccal mucosa, and tongue normal  NECK: supple, non-tender LYMPH:  no palpable lymphadenopathy in the cervical LUNGS: clear to auscultation with normal breathing effort  HEART: regular rate & rhythm and no murmurs and no lower extremity edema ABDOMEN: soft, non-tender, non-distended, normal bowel sounds Musculoskeletal: no cyanosis of digits and no clubbing  PSYCH: alert & oriented x 3, fluent speech NEURO: no focal motor/sensory deficits  LABORATORY DATA:  I have reviewed the data as listed    Component Value Date/Time   NA 138 10/09/2018 0955   K 4.1 10/09/2018 0955   CL 104 10/09/2018 0955   CO2 25 10/09/2018 0955   GLUCOSE 401 (H) 10/09/2018 0955   BUN 18 10/09/2018 0955   CREATININE 0.89 10/09/2018 0955   CALCIUM 9.3 10/09/2018 0955   PROT 7.1 10/09/2018 0955   ALBUMIN 4.4 10/09/2018 0955   AST 16 10/09/2018 0955   AST 22 08/20/2018 1224   ALT 19 10/09/2018 0955   ALT 23 08/20/2018 1224   ALKPHOS 66 10/09/2018 0955   BILITOT 1.1 10/09/2018 0955   BILITOT 1.8 (H) 08/20/2018 1224   GFRNONAA >60 10/09/2018 0955   GFRAA >60 10/09/2018 0955    No results found for: SPEP, UPEP  Lab Results  Component Value Date   WBC 3.0 (L) 10/09/2018   NEUTROABS 1.9 10/09/2018   HGB 15.2 10/09/2018   HCT 41.1 10/09/2018   MCV 90.3 10/09/2018   PLT 104 (L) 10/09/2018      Chemistry      Component Value Date/Time   NA 138 10/09/2018  0955   K 4.1 10/09/2018 0955   CL 104 10/09/2018 0955   CO2 25 10/09/2018 0955   BUN 18 10/09/2018 0955   CREATININE 0.89 10/09/2018 0955      Component Value Date/Time   CALCIUM 9.3 10/09/2018 0955   ALKPHOS 66 10/09/2018 0955   AST 16 10/09/2018 0955   AST 22 08/20/2018 1224   ALT 19 10/09/2018 0955   ALT 23 08/20/2018 1224   BILITOT 1.1 10/09/2018 0955   BILITOT 1.8 (H) 08/20/2018 1224       RADIOGRAPHIC STUDIES: I have personally reviewed the radiological images as listed below and agreed with the findings in the report. Nm Pet Image Initial (pi) Skull Base To Thigh  Result Date: 10/17/2018 CLINICAL DATA:  Initial treatment strategy for stage IB adenocarcinoma of the pancreatic body. EXAM: NUCLEAR MEDICINE PET SKULL BASE TO THIGH TECHNIQUE: 8.0 mCi F-18 FDG was injected intravenously. Full-ring PET imaging was performed from the skull base to thigh after the radiotracer. CT data was obtained and used for attenuation correction and anatomic localization. Fasting blood glucose: 126 mg/dl COMPARISON:  MRI 08/30/2018. FINDINGS: Mediastinal blood pool activity: SUV max 2.0 NECK: No hypermetabolic lymph nodes in the neck. Incidental CT findings: none CHEST: No hypermetabolic mediastinal or hilar nodes. No suspicious pulmonary nodules on the CT scan. Incidental CT findings: There is abdominal aortic atherosclerosis without aneurysm. Coronary artery calcification is evident. Emphysema noted in the lungs bilaterally. ABDOMEN/PELVIS: No substantial hypermetabolism identified in the body of pancreas. A small focus of FDG accumulation in the pancreatic parenchyma is identified in the region of the abrupt pancreatic duct cutoff. This demonstrates SUV max = 2.2. No focal hypermetabolic disease in the liver. No evidence for hypermetabolic lymphadenopathy in the abdomen or pelvis. Incidental CT findings: 7 mm calcified gallstone noted. There is abdominal aortic atherosclerosis without aneurysm. Prostate  gland is enlarged. SKELETON: No focal hypermetabolic activity to suggest skeletal metastasis. Incidental CT findings: none IMPRESSION: 1. Tiny focus of hypermetabolism identified in the body of pancreas, adjacent to the abrupt cut off of the main pancreatic  duct. No evidence for hypermetabolic metastatic disease in the neck, chest, abdomen, or pelvis. 2. Cholelithiasis. 3.  Aortic Atherosclerois (ICD10-170.0) 4. Prostatomegaly Electronically Signed   By: Misty Stanley M.D.   On: 10/17/2018 13:15

## 2018-10-23 NOTE — Patient Instructions (Signed)
Mening Haemophilus influenzae type B Conjugate; Hepatitis B Vaccine injection What is this medicine? HAEMOPHILUS INFLUENZAE TYPE B CONJUGATE VACCINE; HEPATITIS B VACCINE, RECOMBINANT INJECTION (hem OFF fil [place] in floo En zuh vak SEEN; hep uh TAHY tis B vak SEEN) is used to prevent infections of the Haemophilus bacteria and the hepatitis B virus. This medicine may be used for other purposes; ask your health care provider or pharmacist if you have questions. COMMON BRAND NAME(S): Comvax What should I tell my health care provider before I take this medicine? They need to know if you have any of these conditions: -bleeding disorder -hepatitis B infection -immune system problems -infection with fever -low blood counts, like low white cell, platelet, or red cell counts -take medicines that treat or prevent blood clots -an unusual or allergic reaction to vaccines, yeast, other medicines, foods, dyes, or preservatives -pregnant or trying to get pregnant -breast-feeding How should I use this medicine? This vaccine is for injection into a muscle. It is given by a health care professional. A copy of Vaccine Information Statements will be given before each vaccination. Read this sheet carefully each time. The sheet may change frequently. Talk to your pediatrician regarding the use of this medicine in children. While this drug may be prescribed for children as young as 55 weeks old for selected conditions, precautions do apply. Overdosage: If you think you have taken too much of this medicine contact a poison control center or emergency room at once. NOTE: This medicine is only for you. Do not share this medicine with others. What if I miss a dose? Keep appointments for follow-up (booster) doses as directed. It is important not to miss your dose. Call your doctor or health care professional if you are unable to keep an appointment. What may interact with this medicine? -medicines that suppress your immune  function like adalimumab, anakinra, infliximab -medicines to treat cancer -medicines that treat or prevent blood clots like warfarin, enoxaparin, and dalteparin -steroid medicines like prednisone or cortisone This list may not describe all possible interactions. Give your health care provider a list of all the medicines, herbs, non-prescription drugs, or dietary supplements you use. Also tell them if you smoke, drink alcohol, or use illegal drugs. Some items may interact with your medicine. What should I watch for while using this medicine? Visit your doctor for regular check-ups as directed. This vaccine, like all vaccines, may not fully protect everyone. What side effects may I notice from receiving this medicine? Side effects that you should report to your doctor or health care professional as soon as possible: -allergic reactions like skin rash, itching or hives, swelling of the face, lips, or tongue -breathing problems -extreme changes in behavior -fever over 101 degrees F -pain, tingling, numbness in the hands or feet -seizures -unusually weak or tired Side effects that usually do not require medical attention (report to your doctor or health care professional if they continue or are bothersome): -diarrhea -loss of appetite -low-grade fever of 100 degrees F or less -nausea, vomiting -pain, redness, swelling, or irritation at site where injected -tiredness This list may not describe all possible side effects. Call your doctor for medical advice about side effects. You may report side effects to FDA at 1-800-FDA-1088. Where should I keep my medicine? This drug is given in a hospital or clinic and will not be stored at home. NOTE: This sheet is a summary. It may not cover all possible information. If you have questions about this medicine, talk  to your doctor, pharmacist, or health care provider.  2019 Elsevier/Gold Standard (2015-09-23 11:30:38)    Meningococcal Polysaccharide  Vaccine injection What is this medicine? MENINGOCOCCAL POLYSACCHARIDE VACCINE (muh ning goh KOK kal vak SEEN) is a vaccine to protect from bacterial meningitis. This vaccine does not contain live bacteria. It will not cause a meningitis. This medicine may be used for other purposes; ask your health care provider or pharmacist if you have questions. COMMON BRAND NAME(S): Menomune [id number]-135 What should I tell my health care provider before I take this medicine? They need to know if you have any of these conditions: -fever or infection -history of Guillain-Barre syndrome -immune system problems -an unusual or allergic reaction to meningococcal vaccine, latex, other medicines, foods, dyes, or preservatives -pregnant or trying to get pregnant -breast-feeding How should I use this medicine? This medicine is for injection under the skin. It is given by a health care professional in a hospital or clinic setting. A copy of the Vaccine Information Statements will be given before each vaccination. Read this sheet carefully each time. The sheet may change frequently. Talk to your pediatrician regarding the use of this medicine in children. While this drug may be prescribed for children as young as 50 years of age for selected conditions, precautions do apply. Overdosage: If you think you have taken too much of this medicine contact a poison control center or emergency room at once. NOTE: This medicine is only for you. Do not share this medicine with others. What if I miss a dose? This does not apply. What may interact with this medicine? -adalimumab -anakinra -etanercept -infliximab -medicines for organ transplant -medicines to treat cancer -other vaccines -some medicines for arthritis -steroid medicines like prednisone or cortisone This list may not describe all possible interactions. Give your health care provider a list of all the medicines, herbs, non-prescription drugs, or dietary  supplements you use. Also tell them if you smoke, drink alcohol, or use illegal drugs. Some items may interact with your medicine. What should I watch for while using this medicine? This vaccine, like all vaccines, may not fully protect everyone. Report any side effects that are worrisome to your doctor right away. What side effects may I notice from receiving this medicine? Side effects that you should report to your doctor or health care professional as soon as possible: -allergic reactions like skin rash, itching or hives, swelling of the face, lips, or tongue -breathing problems -feeling faint or lightheaded, falls -fever over 102 degrees F -muscle weakness -unusual drooping or paralysis of face Side effects that usually do not require medical attention (report to your doctor or health care professional if they continue or are bothersome): -chills -diarrhea -headache -loss of appetite -muscle aches and pains -pain at site where injected -tired This list may not describe all possible side effects. Call your doctor for medical advice about side effects. You may report side effects to FDA at 1-800-FDA-1088. Where should I keep my medicine? This drug is given in a hospital or clinic and will not be stored at home. NOTE: This sheet is a summary. It may not cover all possible information. If you have questions about this medicine, talk to your doctor, pharmacist, or health care provider.  2019 Elsevier/Gold Standard (2015-09-23 11:32:00)    Pneumococcal Conjugate Vaccine suspension for injection What is this medicine? PNEUMOCOCCAL VACCINE (NEU mo KOK al vak SEEN) is a vaccine used to prevent pneumococcus bacterial infections. These bacteria can cause serious infections like pneumonia, meningitis,  and blood infections. This vaccine will lower your chance of getting pneumonia. If you do get pneumonia, it can make your symptoms milder and your illness shorter. This vaccine will not treat an  infection and will not cause infection. This vaccine is recommended for infants and young children, adults with certain medical conditions, and adults 43 years or older. This medicine may be used for other purposes; ask your health care provider or pharmacist if you have questions. COMMON BRAND NAME(S): Prevnar, Prevnar 13 What should I tell my health care provider before I take this medicine? They need to know if you have any of these conditions: -bleeding problems -fever -immune system problems -an unusual or allergic reaction to pneumococcal vaccine, diphtheria toxoid, other vaccines, latex, other medicines, foods, dyes, or preservatives -pregnant or trying to get pregnant -breast-feeding How should I use this medicine? This vaccine is for injection into a muscle. It is given by a health care professional. A copy of Vaccine Information Statements will be given before each vaccination. Read this sheet carefully each time. The sheet may change frequently. Talk to your pediatrician regarding the use of this medicine in children. While this drug may be prescribed for children as young as 56 weeks old for selected conditions, precautions do apply. Overdosage: If you think you have taken too much of this medicine contact a poison control center or emergency room at once. NOTE: This medicine is only for you. Do not share this medicine with others. What if I miss a dose? It is important not to miss your dose. Call your doctor or health care professional if you are unable to keep an appointment. What may interact with this medicine? -medicines for cancer chemotherapy -medicines that suppress your immune function -steroid medicines like prednisone or cortisone This list may not describe all possible interactions. Give your health care provider a list of all the medicines, herbs, non-prescription drugs, or dietary supplements you use. Also tell them if you smoke, drink alcohol, or use illegal drugs.  Some items may interact with your medicine. What should I watch for while using this medicine? Mild fever and pain should go away in 3 days or less. Report any unusual symptoms to your doctor or health care professional. What side effects may I notice from receiving this medicine? Side effects that you should report to your doctor or health care professional as soon as possible: -allergic reactions like skin rash, itching or hives, swelling of the face, lips, or tongue -breathing problems -confused -fast or irregular heartbeat -fever over 102 degrees F -seizures -unusual bleeding or bruising -unusual muscle weakness Side effects that usually do not require medical attention (report to your doctor or health care professional if they continue or are bothersome): -aches and pains -diarrhea -fever of 102 degrees F or less -headache -irritable -loss of appetite -pain, tender at site where injected -trouble sleeping This list may not describe all possible side effects. Call your doctor for medical advice about side effects. You may report side effects to FDA at 1-800-FDA-1088. Where should I keep my medicine? This does not apply. This vaccine is given in a clinic, pharmacy, doctor's office, or other health care setting and will not be stored at home. NOTE: This sheet is a summary. It may not cover all possible information. If you have questions about this medicine, talk to your doctor, pharmacist, or health care provider.  2019 Elsevier/Gold Standard (2014-05-28 10:27:27)

## 2018-10-23 NOTE — Telephone Encounter (Signed)
Gave avs and calendar ° °

## 2018-10-24 ENCOUNTER — Telehealth: Payer: Self-pay | Admitting: Hematology

## 2018-10-24 NOTE — Telephone Encounter (Signed)
Scheduled genetics per 2/19 sch message - pt is aware of appt date and time

## 2018-10-28 ENCOUNTER — Telehealth (HOSPITAL_COMMUNITY): Payer: Self-pay | Admitting: Internal Medicine

## 2018-10-30 ENCOUNTER — Inpatient Hospital Stay: Payer: PRIVATE HEALTH INSURANCE | Admitting: General Practice

## 2018-10-30 ENCOUNTER — Encounter: Payer: Self-pay | Admitting: General Practice

## 2018-10-30 ENCOUNTER — Telehealth: Payer: Self-pay | Admitting: General Practice

## 2018-10-30 ENCOUNTER — Encounter: Payer: Self-pay | Admitting: Hematology

## 2018-10-30 ENCOUNTER — Encounter: Payer: Self-pay | Admitting: Genetic Counselor

## 2018-10-30 ENCOUNTER — Inpatient Hospital Stay: Payer: PRIVATE HEALTH INSURANCE

## 2018-10-30 ENCOUNTER — Inpatient Hospital Stay (HOSPITAL_BASED_OUTPATIENT_CLINIC_OR_DEPARTMENT_OTHER): Payer: PRIVATE HEALTH INSURANCE | Admitting: Genetic Counselor

## 2018-10-30 DIAGNOSIS — Z794 Long term (current) use of insulin: Secondary | ICD-10-CM

## 2018-10-30 DIAGNOSIS — C259 Malignant neoplasm of pancreas, unspecified: Secondary | ICD-10-CM | POA: Diagnosis not present

## 2018-10-30 DIAGNOSIS — E1165 Type 2 diabetes mellitus with hyperglycemia: Secondary | ICD-10-CM | POA: Diagnosis not present

## 2018-10-30 DIAGNOSIS — Z79899 Other long term (current) drug therapy: Secondary | ICD-10-CM

## 2018-10-30 DIAGNOSIS — D696 Thrombocytopenia, unspecified: Secondary | ICD-10-CM | POA: Diagnosis not present

## 2018-10-30 DIAGNOSIS — Z7982 Long term (current) use of aspirin: Secondary | ICD-10-CM

## 2018-10-30 DIAGNOSIS — D72819 Decreased white blood cell count, unspecified: Secondary | ICD-10-CM | POA: Diagnosis not present

## 2018-10-30 DIAGNOSIS — F17210 Nicotine dependence, cigarettes, uncomplicated: Secondary | ICD-10-CM

## 2018-10-30 DIAGNOSIS — Z800 Family history of malignant neoplasm of digestive organs: Secondary | ICD-10-CM | POA: Insufficient documentation

## 2018-10-30 NOTE — Telephone Encounter (Signed)
[place] CSW Progress Notes  Call from patient, concerned about finances as his current insurance is considered out of network at [facility]. Explained role of [place] and can refer to [employer] for limited assistance w food/gas card.  Patient will come today to complete application.  Also referred to Financial Advocate for any possible assistance w [employer] as well as information on how to contact billing department for further information on [employer]  Also encouraged patient to contact [facility] as they [place] residents diagnosed w cancer w financial and psychosocial needs.  Edwyna Shell, LCSW Clinical Social Worker Phone:  [phone]

## 2018-10-30 NOTE — Telephone Encounter (Signed)
[place] CSW Progress Notes  Error, see next note.

## 2018-10-30 NOTE — Progress Notes (Signed)
[place] CSW Progress Note  [place] fund gas card provided, patient eligible for up to 3 more disbursements.  Linked w [place] for possible financial assistance, referred to Stefanie Libel, Financial Advocate for further help w medical bill concerns.  Edwyna Shell, LCSW Clinical Social Worker Phone:  [phone]

## 2018-10-30 NOTE — Progress Notes (Signed)
Met with patient whom had several concerns regarding financial assistance.  His concern was the surgeon being out of network. Advised him financial concerns regarding this would have to be discussed with the surgeon's office being that they do not bill through [facility]. Advised him for any facility charges and surgery-related expenses billed through [facility], the pre-service center would reach out to his insurance company to obtain authorization and advise of any estimated financial obligations at that time. He verbalized understanding. Also, advised once he receives a bill from [facility], there may be options available such as [employer] where he can combine all [place] bills and make one monthly payment. This is handled through the billing department and number will be on the statement. Advised of the hardship settlement as well where his balance with [employer] would have to at least be $5000 to apply and requires supporting documents to be submitted with the application.  Patient states he will have to do chemo and radiation after surgery. Advised him he will be able to apply for the one-time $700 [facility] gratn to assist with personal expenses while going through treatment. He verbalized understanding.  Gave him my card for any additional financial questions or concerns.

## 2018-10-30 NOTE — Progress Notes (Signed)
REFERRING PROVIDER: Tish Men, MD [place], [place] [id number]  PRIMARY PROVIDER:  Doree Albee, MD  PRIMARY REASON FOR VISIT:  1. Pancreatic adenocarcinoma ([place])   2. Family history of pancreatic cancer      HISTORY OF PRESENT ILLNESS:   Mark Foster, a 63 y.o. male, was seen for a [place] cancer genetics consultation at the request of Dr. Maylon Peppers due to a personal and family history of cancer.  Mark Foster presents to clinic today to discuss the possibility of a hereditary predisposition to cancer, genetic testing, and to further clarify his future cancer risks, as well as potential cancer risks for family members.   In December 2019, at the age of 24, Mark Foster was diagnosed with adenocarcinoma of the pancreas. He is waiting to hear about surgery and chemotherapy.   CANCER HISTORY:    Pancreatic adenocarcinoma ([place])   08/20/2018 Imaging    CT abdomen/pelvis w/ contrast: IMPRESSION: Atrophy and ductal dilatation involving the pancreatic tail, with suspected small soft tissue mass in the pancreatic body which could represent pancreatic carcinoma. Abdomen MRI and MRCP without and with contrast is recommended for further evaluation.  No evidence of hepatobiliary disease.    08/30/2018 Imaging    MRI abdomen w/ contrast: IMPRESSION: 1. Although not definitive, there remains concern of a small hypoenhancing mass at the junction of the pancreatic body and tail associated with atrophy and ductal dilatation in the pancreatic tail. This remains concerning for pancreatic neoplasm. Postinflammatory stricture less likely. Besides a tiny cystic lesion in the pancreatic tail, there are no other signs of previous pancreatitis. Further evaluation with endoscopic ultrasound for possible biopsy strongly recommended. 2. No evidence of metastatic disease. 3. Cholelithiasis without evidence of cholecystitis or biliary dilatation.    09/03/2018 Initial Diagnosis    Pancreatic  adenocarcinoma ([place])    10/03/2018 Procedure    EUS: - 2.6cm irregularly shaped mass in the body of the pancreas that causing main pancreatic duct obstruction and dilation. The mass abuts the splenic vessels but no other significant vascular structures and it was sampled with trangastric EUS FNA. Preliminary cytology is + for malignancy, likely well-differentiated adenocarcinoma. It appears surgically resectable.    10/03/2018 Pathology Results    Accession: [id number]  FINE NEEDLE ASPIRATION, ENDOSCOPIC, PANCREAS BODY (SPECIMEN 1 OF 1 COLLECTED 10/03/18): ADENOCARCINOMA.    10/17/2018 Imaging    PET: IMPRESSION: 1. Tiny focus of hypermetabolism identified in the body of pancreas, adjacent to the abrupt cut off of the main pancreatic duct. No evidence for hypermetabolic metastatic disease in the neck, chest, abdomen, or pelvis. 2. Cholelithiasis. 3.  Aortic Atherosclerois (ICD10-170.0) 4. Prostatomegaly      Past Medical History:  Diagnosis Date  . Cervical compression fracture ([place])   . Cervical spinal stenosis   . Diabetic neuropathy (H[facility])    Bilateral legs  . Diverticulitis   . Family history of pancreatic cancer   . History of kidney stones   . Hypothyroidism   . Stenosis of left vertebral artery   . Stroke [facility])    2011  . Type 2 diabetes mellitus ([place])     Past Surgical History:  Procedure Laterality Date  . APPENDECTOMY    . COLON RESECTION     For diverticulitis  . COLON SURGERY    . ESOPHAGOGASTRODUODENOSCOPY N/A 10/03/2018   Procedure: ESOPHAGOGASTRODUODENOSCOPY (EGD);  Surgeon: [name], MD;  Location[name];  Service: Endoscopy;  Laterality: N/A;  . EUS N/A 10/03/2018  Procedure: UPPER ENDOSCOPIC ULTRASOUND (EUS) RADIAL;  Surgeon: [name], MD;  [facility];  Service: Endoscopy;  Laterality: N/A;  . FINE NEEDLE ASPIRATION N/A 10/03/2018   Procedure: FINE NEEDLE ASPIRATION (FNA) LINEAR;  Surgeon: [name], MD;   [facility];  Service: Endoscopy;  Laterality: N/A;  . IR ANGIO INTRA EXTRACRAN SEL COM CAROTID INNOMINATE BILAT MOD SED  01/21/2018  . IR ANGIO INTRA EXTRACRAN SEL COM CAROTID INNOMINATE UNI R MOD SED  03/21/2018  . IR ANGIO VERTEBRAL SEL VERTEBRAL BILAT MOD SED  01/21/2018  . IR TRANSCATH EXCRAN VERT OR CAR A STENT  03/21/2018  . RADIOLOGY WITH ANESTHESIA N/A 03/21/2018   Procedure: IR WITH ANESTHESIA WITH STENT PLACEMENT;  Surgeon: Luanne Bras, MD;  Location: [facility];  Service: Radiology;  Laterality: N/A;  . ROTATOR CUFF REPAIR     Left    Social History   Socioeconomic History  . Marital status: Single    Spouse name: Not on file  . Number of children: Not on file  . Years of education: Not on file  . Highest education level: Not on file  Occupational History  . Not on file  Social Needs  . Financial resource strain: Not on file  . Food insecurity:    Worry: Not on file    Inability: Not on file  . Transportation needs:    Medical: Not on file    Non-medical: Not on file  Tobacco Use  . Smoking status: Never Smoker  . Smokeless tobacco: Never Used  Substance and Sexual Activity  . Alcohol use: Not Currently  . Drug use: Never  . Sexual activity: Not on file  Lifestyle  . Physical activity:    Days per week: Not on file    Minutes per session: Not on file  . Stress: Not on file  Relationships  . Social connections:    Talks on phone: Not on file    Gets together: Not on file    Attends religious service: Not on file    Active member of club or organization: Not on file    Attends meetings of clubs or organizations: Not on file    Relationship status: Not on file  Other Topics Concern  . Not on file  Social History Narrative  . Not on file     FAMILY HISTORY:  We obtained a detailed, 4-generation family history.  Significant diagnoses are listed below: Family History  Problem Relation Age of Onset  . Stroke Mother   . Pancreatic cancer Father  12       d. 50  . Stroke Maternal Grandmother   . Heart attack Maternal Grandfather   . Heart Problems Paternal Grandfather   . Scoliosis Daughter   . Muscular dystrophy Grandson     The patient has two sons and a daughter who are cancer free.  He has one sister who he has lost track of 20+ years ago. Both parents are deceased.  The patient's mother had polio as a child, and died of a stroke.  She had a brother and two sisters, that the patient has not been in contact with in decades.  The maternal grandparent are deceased from non cancer related issues.  The patient's father died of pancreatic cancer at 62.  He had three sisters and a brother, none had cancer that the patient is aware of.  The paternal grandparents are deceased.  Mark Foster is unaware of previous family history of  genetic testing for hereditary cancer risks. Patient's maternal ancestors are of [place] descent, and paternal ancestors are of Caucasian descent. There is no reported Ashkenazi Jewish ancestry. There is no known consanguinity.  GENETIC COUNSELING ASSESSMENT: Mark Foster is a 63 y.o. male with a personal and family history of pancreatic cancer which is somewhat suggestive of a hereditary cancer syndrome and predisposition to cancer. We, therefore, discussed and recommended the following at today's visit.   DISCUSSION: We discussed that about 5-10% of pancreatic cancer is hereditary with most cases due to BRCA or Lynch syndrome.  We discussed that patients who test positive for BRCA mutations may be eligible for PARP inhibitors in the future if needed.  Additionally, genetic testing will allow the patient and his family be proactive about screening for other cancers they may be at risk for.  We reviewed the characteristics, features and inheritance patterns of hereditary cancer syndromes. We also discussed genetic testing, including the appropriate family members to test, the process of testing, insurance coverage and  turn-around-time for results. We discussed the implications of a negative, positive and/or variant of uncertain significant result. We recommended Mark Foster pursue genetic testing for the common hereditary gene panel. The Hereditary Gene Panel offered by [facility] includes sequencing and/or deletion duplication testing of the following 47 genes: APC, ATM, AXIN2, BARD1, BMPR1A, BRCA1, BRCA2, BRIP1, CDH1, CDK4, CDKN2A (p14ARF), CDKN2A (p16INK4a), CHEK2, CTNNA1, DICER1, EPCAM (Deletion/duplication testing only), GREM1 (promoter region deletion/duplication testing only), KIT, MEN1, MLH1, MSH2, MSH3, MSH6, MUTYH, NBN, NF1, NHTL1, PALB2, PDGFRA, PMS2, POLD1, POLE, PTEN, RAD50, RAD51C, RAD51D, SDHB, SDHC, SDHD, SMAD4, SMARCA4. STK11, TP53, TSC1, TSC2, and VHL.  The following genes were evaluated for sequence changes only: SDHA and HOXB13 c.251G>A variant only.   Based on Mark Foster personal and family history of cancer, he meets medical criteria for genetic testing. Despite that he meets criteria, he may still have an out of pocket cost. We discussed that if his out of pocket cost for testing is over $100, the laboratory will call and confirm whether he wants to proceed with testing.  If the out of pocket cost of testing is less than $100 he will be billed by the genetic testing laboratory.   PLAN: After considering the risks, benefits, and limitations, Mark Foster  provided informed consent to pursue genetic testing and the blood sample was sent to [facility] for analysis of the common hereditary cancer panel. Results should be available within approximately 2-3 weeks' time, at which point they will be disclosed by telephone to Mark Foster, as will any additional recommendations warranted by these results. Mark Foster will receive a summary of his genetic counseling visit and a copy of his results once available. This information will also be available in Epic. We encouraged Mark Foster to remain in contact  with cancer genetics annually so that we can continuously update the family history and inform him of any changes in cancer genetics and testing that may be of benefit for his family. Mark Foster questions were answered to his satisfaction today. Our contact information was provided should additional questions or concerns arise.  Lastly, we encouraged Mark Foster to remain in contact with cancer genetics annually so that we can continuously update the family history and inform him of any changes in cancer genetics and testing that may be of benefit for this family.   Mr.  Foster questions were answered to his satisfaction today. Our contact information was provided should additional questions or concerns arise. Thank  you for the referral and allowing [place] to share in the care of your patient.   Mark Foster P. Florene Glen, [place], [facility] Certified Genetic Counselor [email or web address] phone: [phone]  The patient was seen for a total of 45 minutes in face-to-face genetic counseling.  This patient was discussed with Drs. Magrinat, Lindi Adie and/or Burr Medico who agrees with the above.    _______________________________________________________________________ For Office Staff:  Number of people involved in session: 1 Was an Intern/ student involved with case: no

## 2018-10-31 ENCOUNTER — Encounter: Payer: Self-pay | Admitting: General Practice

## 2018-10-31 ENCOUNTER — Telehealth: Payer: Self-pay | Admitting: *Deleted

## 2018-10-31 DIAGNOSIS — C259 Malignant neoplasm of pancreas, unspecified: Secondary | ICD-10-CM

## 2018-10-31 NOTE — Telephone Encounter (Signed)
Received TC from patient. He states that the recommended surgeon  (Dr. Barry Dienes) is out of network for his insurance plan. He states he cannot afford to pay out of netowrk fees, he is already having difficulty paying some bills as it is. Message regarding this was sent to Dr. Maylon Peppers. Also discussed the patient's concern with GI navigator, Arna Snipe, RN  Pt will need a call back regarding this issue.  He is aware of the urgent need for surgery

## 2018-10-31 NOTE — Telephone Encounter (Signed)
That's understandable. Unfortunately, when I make a referral to a surgeon, it does not indicate whether that surgeon is within the patient's insurance network. In addition, I am not aware of another surgeon that does Whipple's procedure within [facility].   Dawn, can you assist the patient in identifying any other option? Mark Foster may have to call his insurance company and get a list of surgeons within the area that is in network to see who may be able to perform the surgery.   Thank you.  Dr. Maylon Peppers

## 2018-10-31 NOTE — Progress Notes (Signed)
[place] CSW Progress Note  Information and application mailed to patient for [place] - limited source of funding for a $200 annual gas card to assist w transport expenses.  Patient advised to provide required documentation quickly as program has limited funds.  Edwyna Shell, LCSW Clinical Social Worker Phone:  [phone]

## 2018-10-31 NOTE — Progress Notes (Signed)
Patient left message with desk nurse stating that Dr. Barry Dienes is not in network with his insurance. Spent 90 minutes with [employer] [place] and with patient attempting to find surgeon in network.  Patient referred to Dr. Barth Kirks. Zenia Resides at [facility] for surgical consult r/t pancreatic cancer.

## 2018-11-07 ENCOUNTER — Telehealth: Payer: Self-pay | Admitting: Hematology

## 2018-11-07 NOTE — Telephone Encounter (Signed)
Pt appt with Dr. Shon Hough at [facility] is 11/21/18@12 :00. Pt is aware. Medical records faxed

## 2018-11-11 ENCOUNTER — Other Ambulatory Visit: Payer: Self-pay

## 2018-11-11 ENCOUNTER — Encounter (HOSPITAL_COMMUNITY): Payer: Self-pay | Admitting: *Deleted

## 2018-11-11 ENCOUNTER — Emergency Department (HOSPITAL_COMMUNITY): Payer: PRIVATE HEALTH INSURANCE

## 2018-11-11 ENCOUNTER — Observation Stay (HOSPITAL_COMMUNITY)
Admission: EM | Admit: 2018-11-11 | Discharge: 2018-11-13 | Disposition: A | Discharge status: Home / Self Care | Payer: PRIVATE HEALTH INSURANCE | Attending: Internal Medicine | Admitting: Internal Medicine

## 2018-11-11 DIAGNOSIS — Z8673 Personal history of transient ischemic attack (TIA), and cerebral infarction without residual deficits: Secondary | ICD-10-CM | POA: Insufficient documentation

## 2018-11-11 DIAGNOSIS — I951 Orthostatic hypotension: Secondary | ICD-10-CM | POA: Diagnosis not present

## 2018-11-11 DIAGNOSIS — Z79899 Other long term (current) drug therapy: Secondary | ICD-10-CM | POA: Diagnosis not present

## 2018-11-11 DIAGNOSIS — Z794 Long term (current) use of insulin: Secondary | ICD-10-CM | POA: Insufficient documentation

## 2018-11-11 DIAGNOSIS — R55 Syncope and collapse: Secondary | ICD-10-CM

## 2018-11-11 DIAGNOSIS — E119 Type 2 diabetes mellitus without complications: Secondary | ICD-10-CM | POA: Diagnosis not present

## 2018-11-11 DIAGNOSIS — E039 Hypothyroidism, unspecified: Secondary | ICD-10-CM | POA: Diagnosis not present

## 2018-11-11 DIAGNOSIS — Z7982 Long term (current) use of aspirin: Secondary | ICD-10-CM | POA: Diagnosis not present

## 2018-11-11 DIAGNOSIS — R531 Weakness: Secondary | ICD-10-CM

## 2018-11-11 DIAGNOSIS — R4020 Unspecified coma: Secondary | ICD-10-CM | POA: Diagnosis present

## 2018-11-11 LAB — CBC WITH DIFFERENTIAL/PLATELET
Abs Immature Granulocytes: 0 10*3/uL (ref 0.00–0.07)
BASOS PCT: 1 %
Basophils Absolute: 0 10*3/uL (ref 0.0–0.1)
EOS ABS: 0 10*3/uL (ref 0.0–0.5)
Eosinophils Relative: 2 %
HCT: 39.6 % (ref 39.0–52.0)
Hemoglobin: 13.8 g/dL (ref 13.0–17.0)
Immature Granulocytes: 0 %
Lymphocytes Relative: 27 %
Lymphs Abs: 0.6 10*3/uL — ABNORMAL LOW (ref 0.7–4.0)
MCH: 31.9 pg (ref 26.0–34.0)
MCHC: 34.8 g/dL (ref 30.0–36.0)
MCV: 91.7 fL (ref 80.0–100.0)
Monocytes Absolute: 0.1 10*3/uL (ref 0.1–1.0)
Monocytes Relative: 5 %
NRBC: 0 % (ref 0.0–0.2)
Neutro Abs: 1.5 10*3/uL — ABNORMAL LOW (ref 1.7–7.7)
Neutrophils Relative %: 65 %
PLATELETS: 95 10*3/uL — AB (ref 150–400)
RBC: 4.32 MIL/uL (ref 4.22–5.81)
RDW: 13.8 % (ref 11.5–15.5)
WBC: 2.3 10*3/uL — ABNORMAL LOW (ref 4.0–10.5)

## 2018-11-11 LAB — COMPREHENSIVE METABOLIC PANEL
ALT: 16 U/L (ref 0–44)
AST: 13 U/L — ABNORMAL LOW (ref 15–41)
Albumin: 4 g/dL (ref 3.5–5.0)
Alkaline Phosphatase: 58 U/L (ref 38–126)
Anion gap: 8 (ref 5–15)
BUN: 17 mg/dL (ref 8–23)
CO2: 25 mmol/L (ref 22–32)
Calcium: 8.7 mg/dL — ABNORMAL LOW (ref 8.9–10.3)
Chloride: 103 mmol/L (ref 98–111)
Creatinine, Ser: 0.63 mg/dL (ref 0.61–1.24)
GFR calc Af Amer: >60 mL/min (ref >60)
GFR calc non Af Amer: >60 mL/min (ref >60)
Glucose, Bld: 200 mg/dL — ABNORMAL HIGH (ref 70–99)
Potassium: 3.6 mmol/L (ref 3.5–5.1)
Sodium: 136 mmol/L (ref 135–145)
Total Bilirubin: 1.8 mg/dL — ABNORMAL HIGH (ref 0.3–1.2)
Total Protein: 6.6 g/dL (ref 6.5–8.1)

## 2018-11-11 LAB — LACTIC ACID, PLASMA: Lactic Acid, Venous: 1.4 mmol/L (ref 0.5–1.9)

## 2018-11-11 LAB — I-STAT TROPONIN, ED: Troponin i, poc: 0 ng/mL (ref 0.00–0.08)

## 2018-11-11 LAB — ETHANOL: Alcohol, Ethyl (B): <10 mg/dL (ref <10)

## 2018-11-11 LAB — CBG MONITORING, ED: Glucose-Capillary: 171 mg/dL — ABNORMAL HIGH (ref 70–99)

## 2018-11-11 LAB — TROPONIN I: Troponin I: <0.03 ng/mL (ref <0.03)

## 2018-11-11 MED ORDER — SODIUM CHLORIDE 0.9 % IV BOLUS (SEPSIS)
1000.0000 mL | Freq: Once | INTRAVENOUS | Status: AC
  Administered 2018-11-11: 1000 mL via INTRAVENOUS

## 2018-11-11 NOTE — ED Provider Notes (Signed)
[facility] EMERGENCY DEPARTMENT Provider Note   CSN: [id number] Arrival date & time: 11/11/18  1953    History   Chief Complaint Chief Complaint  Patient presents with  . Hypoglycemia    HPI Mark Foster is a 63 y.o. male.     Patient had a near syncopal episode.  He has been feeling poorly for most of the day.  His friend was checking on him and he came to the door very lethargic and then fell to the floor.  She called EMS who promptly treated him for possible hypoglycemia.  When he got here he was still lethargic and weak.  Patient has a history of pancreatic cancer and will get a Whipple's procedure done  The history is provided by the patient. No language interpreter was used.  Weakness  Severity:  Severe Onset quality:  Sudden Timing:  Constant Progression:  Worsening Chronicity:  New Context: alcohol use   Relieved by:  Nothing Worsened by:  Nothing Ineffective treatments:  None tried Associated symptoms: no abdominal pain, no chest pain, no cough, no diarrhea, no frequency, no headaches and no seizures     Past Medical History:  Diagnosis Date  . Cervical compression fracture ([place])   . Cervical spinal stenosis   . Diabetic neuropathy (HCC)    Bilateral legs  . Diverticulitis   . Family history of pancreatic cancer   . History of kidney stones   . Hypothyroidism   . Stenosis of left vertebral artery   . Stroke [facility])    2011  . Type 2 diabetes mellitus [facility])     Patient Active Problem List   Diagnosis Date Noted  . Family history of pancreatic cancer   . Hyperglycemia 10/09/2018  . Tobacco abuse counseling 10/09/2018  . Pancreatic adenocarcinoma ([place]) 09/03/2018  . H/O ETOH abuse 09/03/2018  . Hyperbilirubinemia 09/03/2018  . 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 Uncontrolled type 2 diabetes mellitus with hyperglycemia ([place]) 03/05/2018  . Hypothyroidism 03/05/2018  . Intracranial atherosclerosis 01/22/2018    . Spinal stenosis in cervical region   . Diabetes mellitus type 2 in nonobese (HCC)   . Left carotid artery occlusion 01/21/2018  . Cervical stenosis of spinal canal 01/21/2018  . Right hand weakness 01/21/2018  . Numbness of right hand 01/21/2018  . Essential hypertension, benign 01/21/2018  . Mixed hyperlipidemia 01/21/2018  . Diabetes ([place]) 01/21/2018  . Hx of completed stroke 01/21/2018  . Leukopenia 01/21/2018  . Thrombocytopenia ([place]) 01/21/2018  . 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 APPENDECTOMY    . COLON RESECTION     For diverticulitis  . COLON SURGERY    . ESOPHAGOGASTRODUODENOSCOPY N/A 10/03/2018   Procedure: ESOPHAGOGASTRODUODENOSCOPY (EGD);  Surgeon: [name], MD;  Location: [name];  Service: Endoscopy;  Laterality: N/A;  . EUS N/A 10/03/2018   Procedure: UPPER ENDOSCOPIC ULTRASOUND (EUS) RADIAL;  Surgeon: [name], MD;  [facility];  Service: Endoscopy;  Laterality: N/A;  . FINE NEEDLE ASPIRATION N/A 10/03/2018   Procedure: FINE NEEDLE ASPIRATION (FNA) LINEAR;  Surgeon: [name], MD;  [facility];  Service: Endoscopy;  Laterality: N/A;  . IR ANGIO INTRA EXTRACRAN SEL COM CAROTID INNOMINATE BILAT MOD SED  01/21/2018  . IR ANGIO INTRA EXTRACRAN SEL COM CAROTID INNOMINATE UNI R MOD SED  03/21/2018  . IR ANGIO VERTEBRAL SEL VERTEBRAL BILAT MOD SED  01/21/2018  .  IR TRANSCATH EXCRAN VERT OR CAR A STENT  03/21/2018  . RADIOLOGY WITH ANESTHESIA N/A 03/21/2018   Procedure: IR WITH ANESTHESIA WITH STENT PLACEMENT;  Surgeon: Luanne Bras, MD;  Location: [facility];  Service: Radiology;  Laterality: N/A;  . ROTATOR CUFF REPAIR     Left        Home Medications    Prior to Admission medications   Medication Sig Start Date End Date Taking? Authorizing Provider  aspirin 81 MG tablet Take 1 tablet (81 mg total) by mouth daily. 03/22/18  Yes Louk, Alexandra  M, PA-C  atorvastatin (LIPITOR) 80 MG tablet Take 1 tablet (80 mg total) by mouth daily at 6 PM. Patient taking differently: Take 80 mg by mouth daily.  01/22/18  Yes Donzetta Starch, NP  ECHINACEA EXTRACT PO Take 2 tablets by mouth daily.    Yes [provider]  gabapentin (NEURONTIN) 300 MG capsule Take 1 capsule (300 mg total) by mouth 2 (two) times daily. 08/29/18  Yes Venancio Poisson, NP  glipiZIDE (GLUCOTROL) 10 MG tablet Take 10 mg by mouth See admin instructions. Take 10 mg in the morning, may take an additional 10 mg in the evening as needed for blood sugar over 240 09/09/18  Yes [provider]  insulin degludec (TRESIBA FLEXTOUCH) 100 UNIT/ML SOPN FlexTouch Pen Inject 0.2 mLs (20 Units total) into the skin daily. 08/20/18  Yes Cassandria Anger, MD  NP THYROID 30 MG tablet Take 30 mg by mouth daily before breakfast.  01/17/18  Yes [provider]  sertraline (ZOLOFT) 50 MG tablet Take 50 mg by mouth every morning.    Yes [provider]  ticagrelor (BRILINTA) 90 MG TABS tablet Take 90 mg by mouth 2 (two) times daily.   Yes [provider]  Continuous Blood Gluc Sensor (FREESTYLE LIBRE 14 DAY SENSOR) MISC 1 Device by Does not apply route every 14 (fourteen) days. 01/22/18   Donzetta Starch, NP  Insulin Pen Needle 32G X 4 MM MISC 1 Device by Does not apply route daily. 01/22/18   Donzetta Starch, NP    Family History Family History  Problem Relation Age of Onset  . Stroke Mother   . Pancreatic cancer Father 32       d. 3  . Stroke Maternal Grandmother   . Heart attack Maternal Grandfather   . Heart Problems Paternal Grandfather   . Scoliosis Daughter   . Muscular dystrophy Grandson     Social History Social History   Tobacco Use  . Smoking status: Never Smoker  . Smokeless tobacco: Never Used  Substance Use Topics  . Alcohol use: Not Currently  . Drug use: Never     Allergies   Demerol [meperidine hcl]   Review of  Systems Review of Systems  Constitutional: Negative for appetite change and fatigue.  HENT: Negative for congestion, ear discharge and sinus pressure.   Eyes: Negative for discharge.  Respiratory: Negative for cough.   Cardiovascular: Negative for chest pain.  Gastrointestinal: Negative for abdominal pain and diarrhea.  Genitourinary: Negative for frequency and hematuria.  Musculoskeletal: Negative for back pain.  Skin: Negative for rash.  Neurological: Positive for weakness. Negative for seizures and headaches.  Psychiatric/Behavioral: Negative for hallucinations.     Physical Exam Updated Vital Signs BP 124/67   Pulse 71   Temp 98.6 F (37 C) (Oral)   Resp 14   Ht 5\' 10"  (1.778 m)   Wt 70.3 kg   SpO2  99%   BMI 22.24 kg/m   Physical Exam Vitals signs and nursing note reviewed.  Constitutional:      Appearance: He is well-developed.  HENT:     Head: Normocephalic.     Nose: Nose normal.  Eyes:     General: No scleral icterus.    Conjunctiva/sclera: Conjunctivae normal.  Neck:     Musculoskeletal: Neck supple.     Thyroid: No thyromegaly.  Cardiovascular:     Rate and Rhythm: Normal rate and regular rhythm.     Heart sounds: No murmur. No friction rub. No gallop.   Pulmonary:     Breath sounds: No stridor. No wheezing or rales.  Chest:     Chest wall: No tenderness.  Abdominal:     General: There is no distension.     Tenderness: There is no abdominal tenderness. There is no rebound.  Musculoskeletal: Normal range of motion.  Lymphadenopathy:     Cervical: No cervical adenopathy.  Skin:    Findings: No erythema or rash.  Neurological:     Mental Status: He is oriented to person, place, and time.     Motor: No abnormal muscle tone.     Coordination: Coordination normal.  Psychiatric:        Behavior: Behavior normal.      ED Treatments / Results  Labs (all labs ordered are listed, but only abnormal results are displayed) Labs Reviewed  CBC WITH  DIFFERENTIAL/PLATELET - Abnormal; Notable for the following components:      Result Value   WBC 2.3 (*)    Platelets 95 (*)    Neutro Abs 1.5 (*)    Lymphs Abs 0.6 (*)    All other components within normal limits  COMPREHENSIVE METABOLIC PANEL - Abnormal; Notable for the following components:   Glucose, Bld 200 (*)    Calcium 8.7 (*)    AST 13 (*)    Total Bilirubin 1.8 (*)    All other components within normal limits  CBG MONITORING, ED - Abnormal; Notable for the following components:   Glucose-Capillary 171 (*)    All other components within normal limits  TROPONIN I  LACTIC ACID, PLASMA  URINALYSIS, ROUTINE W REFLEX MICROSCOPIC  RAPID URINE DRUG SCREEN, HOSP PERFORMED  ETHANOL  I-STAT TROPONIN, ED    EKG None  Radiology Dg Chest 2 View  Result Date: 11/11/2018 CLINICAL DATA:  Weakness. EXAM: CHEST - 2 VIEW COMPARISON:  None. FINDINGS: The heart size and mediastinal contours are within normal limits. Both lungs are clear. The visualized skeletal structures are unremarkable. IMPRESSION: No active cardiopulmonary disease. Electronically Signed   By: Marijo Conception, M.D.   On: 11/11/2018 21:21   Ct Head Wo Contrast  Result Date: 11/11/2018 CLINICAL DATA:  Altered level of consciousness, lethargy EXAM: CT HEAD WITHOUT CONTRAST TECHNIQUE: Contiguous axial images were obtained from the base of the skull through the vertex without intravenous contrast. COMPARISON:  CT brain 06/03/2018 FINDINGS: Brain: No acute territorial infarction, hemorrhage or intracranial mass. 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 Mild small vessel ischemic changes of the white matter. Mild atrophy. Stable ventricle size Vascular: No hyperdense vessels.  Carotid vascular calcification Skull: Normal. Negative for fracture or focal lesion. Sinuses/Orbits: Mucosal thickening in the ethmoid sinuses Other: None IMPRESSION: 1. No CT evidence for acute intracranial abnormality.  2. Atrophy and chronic infarct in the left posterior frontal lobe. Electronically Signed   By: Donavan Foil M.D.   On:  11/11/2018 21:02    Procedures Procedures (including critical care time)  Medications Ordered in ED Medications  sodium chloride 0.9 % bolus 1,000 mL (1,000 mLs Intravenous New Bag/Given 11/11/18 2141)     Initial Impression / Assessment and Plan / ED Course  I have reviewed the triage vital signs and the nursing notes.  Pertinent labs & imaging results that were available during my care of the patient were reviewed by me and considered in my medical decision making (see chart for details).        Patient with pancreatic cancer and near syncopal episode with persistent weakness.  He will be admitted for observation and hydration by medicine  Final Clinical Impressions(s) / ED Diagnoses   Final diagnoses:  None    ED Discharge Orders    None       Milton Ferguson, MD 11/11/18 2225

## 2018-11-11 NOTE — ED Triage Notes (Signed)
Patient friends called EMS reports lethargy and "low blood sugar".  Per EMS CBG is 109.  EMS gave 15 g oral glucose with "no change".  Patient alert, oriented, no distress noted.

## 2018-11-12 ENCOUNTER — Observation Stay (HOSPITAL_BASED_OUTPATIENT_CLINIC_OR_DEPARTMENT_OTHER): Payer: PRIVATE HEALTH INSURANCE

## 2018-11-12 ENCOUNTER — Observation Stay (HOSPITAL_COMMUNITY): Payer: PRIVATE HEALTH INSURANCE

## 2018-11-12 DIAGNOSIS — R4020 Unspecified coma: Secondary | ICD-10-CM | POA: Diagnosis present

## 2018-11-12 DIAGNOSIS — R55 Syncope and collapse: Secondary | ICD-10-CM

## 2018-11-12 DIAGNOSIS — R531 Weakness: Secondary | ICD-10-CM

## 2018-11-12 LAB — COMPREHENSIVE METABOLIC PANEL
ALT: 15 U/L (ref 0–44)
AST: 15 U/L (ref 15–41)
Albumin: 3.9 g/dL (ref 3.5–5.0)
Alkaline Phosphatase: 55 U/L (ref 38–126)
Anion gap: 6 (ref 5–15)
BUN: 14 mg/dL (ref 8–23)
CO2: 26 mmol/L (ref 22–32)
Calcium: 8.5 mg/dL — ABNORMAL LOW (ref 8.9–10.3)
Chloride: 108 mmol/L (ref 98–111)
Creatinine, Ser: 0.55 mg/dL — ABNORMAL LOW (ref 0.61–1.24)
GFR calc Af Amer: >60 mL/min (ref >60)
GFR calc non Af Amer: >60 mL/min (ref >60)
Glucose, Bld: 123 mg/dL — ABNORMAL HIGH (ref 70–99)
Potassium: 3.9 mmol/L (ref 3.5–5.1)
Sodium: 140 mmol/L (ref 135–145)
Total Bilirubin: 1.6 mg/dL — ABNORMAL HIGH (ref 0.3–1.2)
Total Protein: 6.5 g/dL (ref 6.5–8.1)

## 2018-11-12 LAB — RAPID URINE DRUG SCREEN, HOSP PERFORMED
Amphetamines: NOT DETECTED
Barbiturates: NOT DETECTED
Benzodiazepines: NOT DETECTED
Cocaine: NOT DETECTED
OPIATES: NOT DETECTED
TETRAHYDROCANNABINOL: NOT DETECTED

## 2018-11-12 LAB — CBC
HCT: 38 % — ABNORMAL LOW (ref 39.0–52.0)
Hemoglobin: 13.3 g/dL (ref 13.0–17.0)
MCH: 32.1 pg (ref 26.0–34.0)
MCHC: 35 g/dL (ref 30.0–36.0)
MCV: 91.8 fL (ref 80.0–100.0)
Platelets: 98 10*3/uL — ABNORMAL LOW (ref 150–400)
RBC: 4.14 MIL/uL — ABNORMAL LOW (ref 4.22–5.81)
RDW: 13.7 % (ref 11.5–15.5)
WBC: 2.9 10*3/uL — ABNORMAL LOW (ref 4.0–10.5)
nRBC: 0 % (ref 0.0–0.2)

## 2018-11-12 LAB — URINALYSIS, ROUTINE W REFLEX MICROSCOPIC
Bacteria, UA: NONE SEEN
Bilirubin Urine: NEGATIVE
Glucose, UA: >=500 mg/dL — AB
Hgb urine dipstick: NEGATIVE
Ketones, ur: NEGATIVE mg/dL
Leukocytes,Ua: NEGATIVE
Nitrite: NEGATIVE
Protein, ur: NEGATIVE mg/dL
Specific Gravity, Urine: 1.016 (ref 1.005–1.030)
pH: 7 (ref 5.0–8.0)

## 2018-11-12 LAB — ECHOCARDIOGRAM COMPLETE
Height: 70 in
Weight: 2536.17 oz

## 2018-11-12 LAB — GLUCOSE, CAPILLARY
Glucose-Capillary: 96 mg/dL (ref 70–99)
Glucose-Capillary: 232 mg/dL — ABNORMAL HIGH (ref 70–99)
Glucose-Capillary: 233 mg/dL — ABNORMAL HIGH (ref 70–99)

## 2018-11-12 LAB — TROPONIN I
Troponin I: <0.03 ng/mL (ref <0.03)
Troponin I: <0.03 ng/mL (ref <0.03)
Troponin I: <0.03 ng/mL (ref <0.03)

## 2018-11-12 LAB — CORTISOL: Cortisol, Plasma: 2.3 ug/dL

## 2018-11-12 LAB — HEMOGLOBIN A1C
Hgb A1c MFr Bld: 9 % — ABNORMAL HIGH (ref 4.8–5.6)
MEAN PLASMA GLUCOSE: 211.6 mg/dL

## 2018-11-12 LAB — TSH: TSH: 1.29 u[IU]/mL (ref 0.350–4.500)

## 2018-11-12 MED ORDER — SERTRALINE HCL 50 MG PO TABS
50.0000 mg | ORAL_TABLET | ORAL | Status: DC
  Administered 2018-11-12 – 2018-11-13 (×3): 50 mg via ORAL
  Filled 2018-11-12 (×3): qty 1

## 2018-11-12 MED ORDER — SODIUM CHLORIDE 0.9 % IV SOLN
INTRAVENOUS | Status: DC
  Administered 2018-11-12 – 2018-11-13 (×3): via INTRAVENOUS

## 2018-11-12 MED ORDER — ONDANSETRON HCL 4 MG PO TABS: 4.0000 mg | ORAL_TABLET | Freq: Four times a day (QID) | ORAL | Status: DC | PRN

## 2018-11-12 MED ORDER — ONDANSETRON HCL 4 MG/2ML IJ SOLN: 4.0000 mg | Freq: Four times a day (QID) | INTRAMUSCULAR | Status: DC | PRN

## 2018-11-12 MED ORDER — ENOXAPARIN SODIUM 40 MG/0.4ML ~~LOC~~ SOLN: 40.0000 mg | SUBCUTANEOUS | Status: DC

## 2018-11-12 MED ORDER — GABAPENTIN 300 MG PO CAPS
300.0000 mg | ORAL_CAPSULE | Freq: Two times a day (BID) | ORAL | Status: DC
  Administered 2018-11-12 – 2018-11-13 (×4): 300 mg via ORAL
  Filled 2018-11-12 (×4): qty 1

## 2018-11-12 MED ORDER — ACETAMINOPHEN 325 MG PO TABS
650.0000 mg | ORAL_TABLET | Freq: Four times a day (QID) | ORAL | Status: DC | PRN
  Administered 2018-11-12: 650 mg via ORAL
  Filled 2018-11-12: qty 2

## 2018-11-12 MED ORDER — ATORVASTATIN CALCIUM 40 MG PO TABS
80.0000 mg | ORAL_TABLET | Freq: Every day | ORAL | Status: DC
  Administered 2018-11-12 – 2018-11-13 (×2): 80 mg via ORAL
  Filled 2018-11-12 (×2): qty 2

## 2018-11-12 MED ORDER — ASPIRIN EC 81 MG PO TBEC
81.0000 mg | DELAYED_RELEASE_TABLET | Freq: Every day | ORAL | Status: DC
  Administered 2018-11-12 – 2018-11-13 (×2): 81 mg via ORAL
  Filled 2018-11-12 (×2): qty 1

## 2018-11-12 MED ORDER — THYROID 30 MG PO TABS
30.0000 mg | ORAL_TABLET | Freq: Every day | ORAL | Status: DC
  Administered 2018-11-12: 30 mg via ORAL
  Filled 2018-11-12 (×4): qty 1

## 2018-11-12 MED ORDER — INSULIN ASPART 100 UNIT/ML ~~LOC~~ SOLN
0.0000 [IU] | Freq: Four times a day (QID) | SUBCUTANEOUS | Status: DC
  Administered 2018-11-12 (×2): 3 [IU] via SUBCUTANEOUS
  Administered 2018-11-13: 1 [IU] via SUBCUTANEOUS
  Administered 2018-11-13: 5 [IU] via SUBCUTANEOUS

## 2018-11-12 MED ORDER — TICAGRELOR 90 MG PO TABS
90.0000 mg | ORAL_TABLET | Freq: Two times a day (BID) | ORAL | Status: DC
  Administered 2018-11-12 – 2018-11-13 (×4): 90 mg via ORAL
  Filled 2018-11-12 (×4): qty 1

## 2018-11-12 NOTE — Progress Notes (Signed)
*  PRELIMINARY RESULTS* Echocardiogram 2D Echocardiogram has been performed.  Leavy Cella 11/12/2018, 12:19 PM

## 2018-11-12 NOTE — Progress Notes (Signed)
[place] OFFICE PROGRESS NOTE  Patient Care Team: Doree Albee, MD as PCP - General (Internal Medicine)  HEME/ONC OVERVIEW: 1. 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 Audelia Hives -08/2018: CT abdomen/pelvis (for leukopenia/thrombocytopenia) showed an incidental pancreatic body soft tissue prominence ~2cm, atrophy and pancreatic ductal dilatation involving pancreatic tail; MRI abdomen showed a 1.8 x 1.8cm area of hypoenhancement at the junction between the pancreatic head and body  -10/03/2018: EUS showed a 2.6cm mass in the pancreatic body causing pancreatic duct obstruction and dilation; mass abuts the splenic vessels but no other vascular structure; FNA showed adenocarcinoma  -10/2018: PET showed a small hypermetabolic focus within the body of pancreas, no other FDG-avid disease   2. Mild leukopenia  -WBC ~2-3k w/ ANC > 1000 since early 2019; no prior for comparison -Viral, nutritional studies unremarkable   3. Mild thrombocytopenia (plts ~100-110k's)  PERTINENT NON-HEM/ONC PROBLEMS: 1. Extensive cerebrovascular disease 2. 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 Barry Dienes of surgical oncology and was planning to undergo open subtotal pancreatectomy with splenectomy; however, he later discovered that Dr. Barry Dienes was not within the patient's network, and therefore had to be referred to [facility] for surgical evaluation -His appt at [facility] is scheduled on 11/14/2018 -If he undergoes upfront surgical resection, we will determine the adjuvant chemotherapy regimen based on final pathology -He has received a series of vaccinations anticipation of splenectomy; he will also require vaccination at 2 months, 3 months and 5 years post-splenectomy  Leukopenia -WBC 3.0k w/ 1600, stable -Etiology unclear; patient has a remote history of heavy EtOH abuse, which may lead to liver  dysfunction and/or bone marrow suppression -Patient denies any symptoms of infection -In light of the patient's pancreatic cancer, we will defer any further work-up for now  Thrombocytopenia -Plts 95k, stable -Etiology unclear; MRI abdomen in 08/2018 did not show any evidence of liver cirrhosis.  Patient has a remote history of EtOH abuse, which may lead to underlying marrow suppression -Patient denies any symptoms of bleeding -As stated above, work-up for thrombocytopenia has been deferred until patient completes evaluation and treatment for his pancreatic cancer  Hyperbilirubinemia -Tbili 1.9 today, stable -Possibly due to underlying mild obstruction in the pancrease -MRI and CT abdomen results as below -We will monitor it for now  Recent near syncope -Patient was just discharged from the hospital at [facility] on 11/13/2018 for near syncope, thought to be due to orthostatic hypotension -Encouraged patient to follow-up with his PCP for further management  Orders Placed This Encounter  Procedures  . CBC with Differential (Cancer Center Only)    Standing Status:   Future    Standing Expiration Date:   12/18/2019  . CMP ([place] only)    Standing Status:   Future    Standing Expiration Date:   12/18/2019  . CA 19.9    Standing Status:   Future    Standing Expiration Date:   11/13/2019   All questions were answered. The patient knows to call the clinic with any problems, questions or concerns. No barriers to learning was detected.  A total of more than 25 minutes were spent face-to-face with the patient during this encounter and over half of that time was spent on counseling and coordination of care as outlined above.   Return in 4 weeks for labs and clinic follow-up.   Mark Men, MD 11/13/2018  4:55 PM  CHIEF COMPLAINT: "I just got out of the hospital"  INTERVAL HISTORY: Mark Foster returns to clinic for follow-up of stage I pancreatic adenocarcinoma.  Patient reports that  he was just discharged from [facility] this morning after he was hospitalized with near syncope, thought to be due to orthostatic hypotension.  He reports that since his discharge, he has not had any recurrent episode of syncope, chest pain, dyspnea, palpitation, or lightheadedness.  He has an appointment with [facility] oncology on 11/14/2018.  He otherwise denies any complaint today.  SUMMARY OF ONCOLOGIC HISTORY:   Pancreatic adenocarcinoma ([place])   08/20/2018 Imaging    CT abdomen/pelvis w/ contrast: IMPRESSION: Atrophy and ductal dilatation involving the pancreatic tail, with suspected small soft tissue mass in the pancreatic body which could represent pancreatic carcinoma. Abdomen MRI and MRCP without and with contrast is recommended for further evaluation.  No evidence of hepatobiliary disease.    08/30/2018 Imaging    MRI abdomen w/ contrast: IMPRESSION: 1. Although not definitive, there remains concern of a small hypoenhancing mass at the junction of the pancreatic body and tail associated with atrophy and ductal dilatation in the pancreatic tail. This remains concerning for pancreatic neoplasm. Postinflammatory stricture less likely. Besides a tiny cystic lesion in the pancreatic tail, there are no other signs of previous pancreatitis. Further evaluation with endoscopic ultrasound for possible biopsy strongly recommended. 2. No evidence of metastatic disease. 3. Cholelithiasis without evidence of cholecystitis or biliary dilatation.    09/03/2018 Initial Diagnosis    Pancreatic adenocarcinoma ([place])    10/03/2018 Procedure    EUS: - 2.6cm irregularly shaped mass in the body of the pancreas that causing main pancreatic duct obstruction and dilation. The mass abuts the splenic vessels but no other significant vascular structures and it was sampled with trangastric EUS FNA. Preliminary cytology is + for malignancy, likely well-differentiated adenocarcinoma. It appears  surgically resectable.    10/03/2018 Pathology Results    Accession: [id number]  FINE NEEDLE ASPIRATION, ENDOSCOPIC, PANCREAS BODY (SPECIMEN 1 OF 1 COLLECTED 10/03/18): ADENOCARCINOMA.    10/17/2018 Imaging    PET: IMPRESSION: 1. Tiny focus of hypermetabolism identified in the body of pancreas, adjacent to the abrupt cut off of the main pancreatic duct. No evidence for hypermetabolic metastatic disease in the neck, chest, abdomen, or pelvis. 2. Cholelithiasis. 3.  Aortic Atherosclerois (ICD10-170.0) 4. Prostatomegaly     REVIEW OF SYSTEMS:   Constitutional: ( - ) fevers, ( - )  chills , ( - ) night sweats Eyes: ( - ) blurriness of vision, ( - ) double vision, ( - ) watery eyes Ears, nose, mouth, throat, and face: ( - ) mucositis, ( - ) sore throat Respiratory: ( - ) cough, ( - ) dyspnea, ( - ) wheezes Cardiovascular: ( - ) palpitation, ( - ) chest discomfort, ( - ) lower extremity swelling Gastrointestinal:  ( - ) nausea, ( - ) heartburn, ( - ) change in bowel habits Skin: ( - ) abnormal skin rashes Lymphatics: ( - ) new lymphadenopathy, ( - ) easy bruising Neurological: ( - ) numbness, ( - ) tingling, ( - ) new weaknesses Behavioral/Psych: ( - ) mood change, ( - ) new changes  All other systems were reviewed with the patient and are negative.  I have reviewed the past medical history, past surgical history, social history and family history with the patient and they are unchanged from previous note.  ALLERGIES:  is allergic to demerol [meperidine  hcl].  MEDICATIONS:  Current Outpatient Medications  Medication Sig Dispense Refill  . aspirin 81 MG tablet Take 1 tablet (81 mg total) by mouth daily. 30 tablet 3  . atorvastatin (LIPITOR) 80 MG tablet Take 1 tablet (80 mg total) by mouth daily at 6 PM. (Patient taking differently: Take 80 mg by mouth daily. ) 30 tablet 2  . Continuous Blood Gluc Sensor (FREESTYLE LIBRE 14 DAY SENSOR) MISC 1 Device by Does not apply route every 14  (fourteen) days. 2 each 2  . ECHINACEA EXTRACT PO Take 2 tablets by mouth daily.     Marland Kitchen gabapentin (NEURONTIN) 300 MG capsule Take 1 capsule (300 mg total) by mouth 2 (two) times daily. 60 capsule 3  . glipiZIDE (GLUCOTROL) 10 MG tablet Take 10 mg by mouth See admin instructions. Take 10 mg in the morning, may take an additional 10 mg in the evening as needed for blood sugar over 240    . insulin degludec (TRESIBA FLEXTOUCH) 100 UNIT/ML SOPN FlexTouch Pen Inject 0.2 mLs (20 Units total) into the skin daily. 5 pen 2  . Insulin Pen Needle 32G X 4 MM MISC 1 Device by Does not apply route daily. 50 each 2  . NP THYROID 30 MG tablet Take 30 mg by mouth daily before breakfast.   2  . sertraline (ZOLOFT) 50 MG tablet Take 50 mg by mouth every morning.     . ticagrelor (BRILINTA) 90 MG TABS tablet Take 90 mg by mouth 2 (two) times daily.     No current facility-administered medications for this visit.     PHYSICAL EXAMINATION: ECOG PERFORMANCE STATUS: 1 - Symptomatic but completely ambulatory  Today's Vitals   11/13/18 1605 11/13/18 1613  BP: 119/76   Pulse: 70   Resp: 18   Temp: 97.8 F (36.6 C)   TempSrc: Oral   SpO2: 99%   Weight: 160 lb 4.8 oz (72.7 kg)   Height: _0  (1.778 m)   PainSc:  0-No pain   Body mass index is 23 kg/m.  Filed Weights   11/13/18 1605  Weight: 160 lb 4.8 oz (72.7 kg)    GENERAL: alert, no distress and comfortable, thin SKIN: skin color, texture, turgor are normal, no rashes or significant lesions EYES: conjunctiva are pink and non-injected, sclera clear OROPHARYNX: no exudate, no erythema; lips, buccal mucosa, and tongue normal  NECK: supple, non-tender LYMPH:  no palpable lymphadenopathy in the cervical LUNGS: clear to auscultation with normal breathing effort HEART: regular rate & rhythm and no murmurs and no lower extremity edema ABDOMEN: soft, non-tender, non-distended, normal bowel sounds Musculoskeletal: no cyanosis of digits and no clubbing   PSYCH: alert & oriented x 3, fluent speech NEURO: no focal motor/sensory deficits  LABORATORY DATA:  I have reviewed the data as listed    Component Value Date/Time   NA 137 11/13/2018 1452   K 4.0 11/13/2018 1452   CL 106 11/13/2018 1452   CO2 24 11/13/2018 1452   GLUCOSE 219 (H) 11/13/2018 1452   BUN 10 11/13/2018 1452   CREATININE 0.82 11/13/2018 1452   CALCIUM 8.8 (L) 11/13/2018 1452   PROT 6.6 11/13/2018 1452   ALBUMIN 3.8 11/13/2018 1452   AST 11 (L) 11/13/2018 1452   ALT 10 11/13/2018 1452   ALKPHOS 69 11/13/2018 1452   BILITOT 1.9 (H) 11/13/2018 1452   GFRNONAA >60 11/13/2018 1452   GFRAA >60 11/13/2018 1452    No results found for: SPEP, UPEP  Lab Results  Component Value Date   WBC 3.0 (L) 11/13/2018   NEUTROABS 1.6 (L) 11/13/2018   HGB 13.2 11/13/2018   HCT 37.2 (L) 11/13/2018   MCV 91.9 11/13/2018   PLT 95 (L) 11/13/2018      Chemistry      Component Value Date/Time   NA 137 11/13/2018 1452   K 4.0 11/13/2018 1452   CL 106 11/13/2018 1452   CO2 24 11/13/2018 1452   BUN 10 11/13/2018 1452   CREATININE 0.82 11/13/2018 1452      Component Value Date/Time   CALCIUM 8.8 (L) 11/13/2018 1452   ALKPHOS 69 11/13/2018 1452   AST 11 (L) 11/13/2018 1452   ALT 10 11/13/2018 1452   BILITOT 1.9 (H) 11/13/2018 1452       RADIOGRAPHIC STUDIES: I have personally reviewed the radiological images as listed below and agreed with the findings in the report. Dg Chest 2 View  Result Date: 11/11/2018 CLINICAL DATA:  Weakness. EXAM: CHEST - 2 VIEW COMPARISON:  None. FINDINGS: The heart size and mediastinal contours are within normal limits. Both lungs are clear. The visualized skeletal structures are unremarkable. IMPRESSION: No active cardiopulmonary disease. Electronically Signed   By: Marijo Conception, M.D.   On: 11/11/2018 21:21   Ct Head Wo Contrast  Result Date: 11/11/2018 CLINICAL DATA:  Altered level of consciousness, lethargy EXAM: CT HEAD WITHOUT CONTRAST  TECHNIQUE: Contiguous axial images were obtained from the base of the skull through the vertex without intravenous contrast. COMPARISON:  CT brain 06/03/2018 FINDINGS: Brain: No acute territorial infarction, hemorrhage or intracranial mass. 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 Mild small vessel ischemic changes of the white matter. Mild atrophy. Stable ventricle size Vascular: No hyperdense vessels.  Carotid vascular calcification Skull: Normal. Negative for fracture or focal lesion. Sinuses/Orbits: Mucosal thickening in the ethmoid sinuses Other: None IMPRESSION: 1. No CT evidence for acute intracranial abnormality. 2. Atrophy and chronic infarct in the left posterior frontal lobe. Electronically Signed   By: Donavan Foil M.D.   On: 11/11/2018 21:02   Mr Brain Wo Contrast  Result Date: 11/12/2018 CLINICAL DATA:  Altered level of consciousness. EXAM: MRI HEAD WITHOUT CONTRAST TECHNIQUE: Multiplanar, multiecho pulse sequences of the brain and surrounding structures were obtained without intravenous contrast. COMPARISON:  MRI head 01/20/2018, CT 11/11/2018 FINDINGS: Brain: Negative for acute infarct. Chronic infarct left parietal lobe. Minimal chronic changes in the white matter. Negative for hemorrhage or mass. Ventricle size normal. 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 Otherwise normal flow voids at the skull base. Skull and upper cervical spine: Negative Sinuses/Orbits: Mild mucosal edema paranasal sinuses.  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 Electronically Signed   By: Franchot Gallo M.D.   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 EXAM: NUCLEAR MEDICINE PET SKULL BASE TO THIGH  TECHNIQUE: 8.0 mCi F-18 FDG was injected intravenously. Full-ring PET imaging was performed from the skull base to thigh after the radiotracer. CT data was obtained and used for attenuation correction and anatomic localization. Fasting blood glucose: 126 mg/dl COMPARISON:  MRI 08/30/2018. FINDINGS: Mediastinal blood pool activity: SUV max 2.0 NECK: No hypermetabolic lymph nodes in the neck. Incidental CT findings: none CHEST: No hypermetabolic mediastinal or hilar nodes. No suspicious pulmonary nodules  on the CT scan. Incidental CT findings: There is abdominal aortic atherosclerosis without aneurysm. Coronary artery calcification is evident. Emphysema noted in the lungs bilaterally. ABDOMEN/PELVIS: No substantial hypermetabolism identified in the body of pancreas. A small focus of FDG accumulation in the pancreatic parenchyma is identified in the region of the abrupt pancreatic duct cutoff. This demonstrates SUV max = 2.2. No focal hypermetabolic disease in the liver. No evidence for hypermetabolic lymphadenopathy in the abdomen or pelvis. Incidental CT findings: 7 mm calcified gallstone noted. There is abdominal aortic atherosclerosis without aneurysm. Prostate gland is enlarged. SKELETON: No focal hypermetabolic activity to suggest skeletal metastasis. Incidental CT findings: none IMPRESSION: 1. Tiny focus of hypermetabolism identified in the body of pancreas, adjacent to the abrupt cut off of the main pancreatic duct. No evidence for hypermetabolic metastatic disease in the neck, chest, abdomen, or pelvis. 2. Cholelithiasis. 3.  Aortic Atherosclerois (ICD10-170.0) 4. Prostatomegaly Electronically Signed   By: Misty Stanley M.D.   On: 10/17/2018 13:15

## 2018-11-12 NOTE — Progress Notes (Signed)
Patient admitted to the hospital earlier this morning by Dr. Darrick Meigs.  Patient seen and examined.  Continues to feel weak.  Lungs are clear to auscultation.  He is not have any significant lower extremity edema.  His orthostatics were checked this morning and blood pressure had significant decline with a systolic blood pressure into the 70s.  Patient was admitted for an episode of syncope.  He is noted to be orthostatic.  He denies any significant vomiting or diarrhea.  Has not had any recent upper respiratory tract infection.  Reports that his p.o. intake has been normal for him.  He has been hydrated with IV fluids.  Will place TED hose stockings.  Check cortisol level.  TSH is normal.  Check echocardiogram.  Anticipate discharge in the next 24 hours if orthostatics improve and is asymptomatic.  [employer]

## 2018-11-12 NOTE — Evaluation (Signed)
Physical Therapy Evaluation Patient Details Name: Mark Foster MRN: [id number] DOB: 08-08-1956 Today's Date: 11/12/2018   History of Present Illness  Mark Foster  is a 63 y.o. 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    Clinical Impression  Patient functioning at baseline for functional mobility and gait.  Patient orthostatic BP as follows: supine 113/70, sitting 104/72, standing 140/125.  Patient no c/o dizziness and ambulated in room and hallways without loss of balance.  Patient left with nursing staff to be taken for a procedure.  Plan:  Patient discharged from physical therapy to care of nursing for ambulation daily as tolerated for length of stay.    Follow Up Recommendations No PT follow up    Equipment Recommendations  None recommended by PT    Recommendations for Other Services       Precautions / Restrictions Precautions Precautions: Fall Restrictions Weight Bearing Restrictions: No      Mobility  Bed Mobility Overal bed mobility: Modified Independent             General bed mobility comments: increased time  Transfers Overall transfer level: Modified independent Equipment used: Quad cane             General transfer comment: increased time, no loss of balance  Ambulation/Gait Ambulation/Gait assistance: Modified independent (Device/Increase time) Gait Distance (Feet): 150 Feet Assistive device: Quad cane Gait Pattern/deviations: Step-through pattern;WFL(Within Functional Limits) Gait velocity: slightly decreased   General Gait Details: good return for ambulation in room and hallways without loss of balance with 2 point gait  pattern  Stairs            Wheelchair Mobility    Modified Rankin (Stroke Patients Only)       Balance Overall balance assessment: Mild deficits observed, not formally tested                                           Pertinent Vitals/Pain Pain Assessment: No/denies pain    Home Living Family/patient expects to be discharged to:: Private residence Living Arrangements: Alone Available Help at Discharge: Family;Available PRN/intermittently Type of Home: House Home Access: Stairs to enter Entrance Stairs-Rails: Right;Left;Can reach both Entrance Stairs-Number of Steps: 8 in back, 3 in front, usually uses back entrance Home Layout: One level Home Equipment: Cane - quad;Cane - single point;Walker - 2 wheels      Prior Function Level of Independence: Independent with assistive device(s)         Comments: community ambulator with quad cane, drives, still rides motorcyle      Hand Dominance   Dominant Hand: Right    Extremity/Trunk Assessment   Upper Extremity Assessment Upper Extremity Assessment: Overall WFL for tasks assessed    Lower Extremity Assessment Lower Extremity Assessment: Overall WFL for tasks assessed    Cervical / Trunk Assessment Cervical / Trunk Assessment: Normal  Communication   Communication: No difficulties  Cognition Arousal/Alertness: Awake/alert Behavior During Therapy: WFL for tasks assessed/performed Overall Cognitive Status: Within Functional Limits for tasks assessed  General Comments      Exercises     Assessment/Plan    PT Assessment Patent does not need any further PT services  PT Problem List         PT Treatment Interventions      PT Goals (Current goals can be found in the Care Plan section)  Acute Rehab PT Goals Patient Stated Goal: return home PT Goal Formulation: With patient Time For Goal Achievement: 11/12/18 Potential to  Achieve Goals: Good    Frequency     Barriers to discharge        Co-evaluation               AM-PAC PT "6 Clicks" Mobility  Outcome Measure Help needed turning from your back to your side while in a flat bed without using bedrails?: None Help needed moving from lying on your back to sitting on the side of a flat bed without using bedrails?: None Help needed moving to and from a bed to a chair (including a wheelchair)?: None Help needed standing up from a chair using your arms (e.g., wheelchair or bedside chair)?: None Help needed to walk in hospital room?: None Help needed climbing 3-5 steps with a railing? : None 6 Click Score: 24    End of Session   Activity Tolerance: Patient tolerated treatment well Patient left: in chair;Other (comment)(left in wheelchair to be taken to procedure with nursing staff) Nurse Communication: Mobility status PT Visit Diagnosis: Unsteadiness on feet (R26.81);Other abnormalities of gait and mobility (R26.89);Muscle weakness (generalized) (M62.81)    Time: 7579-7282 PT Time Calculation (min) (ACUTE ONLY): 26 min   Charges:   PT Evaluation $PT Eval Moderate Complexity: 1 Mod PT Treatments $Gait Training: 8-22 mins        9:04 AM, 11/12/18 Lonell Grandchild, MPT Physical Therapist with [facility] [phone] office [phone] mobile phone

## 2018-11-12 NOTE — H&P (Signed)
TRH H&P    Patient Demographics:    Mark Foster, is a 63 y.o. male  MRN: [id number]  DOB - April 14, 1956  Admit Date - 11/11/2018  Referring MD/NP/PA: Dr. Roderic Palau  Outpatient Primary MD for the patient is Doree Albee, MD  Patient coming from: Home  Chief complaint-passed out   HPI:    Mark Foster  is a 63 y.o. 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 As per patient he has been feeling weak over the past few days, today when his sister showed up at his house he went to open the door, as soon as he opened the door he was on the floor and does not remember events after that. He denies chest pain, Denies shortness of breath, Denies nausea vomiting or diarrhea, Denies palpitations. He was found to have a glucose of 105. In the ED, lab work is unremarkable.  EKG is unremarkable     Review of systems:    In addition to the HPI above,    All other systems reviewed and are negative.    Past History of the following :    Past Medical History:  Diagnosis Date  . Cervical compression fracture ([place])   . Cervical spinal stenosis   . Diabetic neuropathy ([facility])    Bilateral legs  . Diverticulitis   . Family history of pancreatic cancer   . History of kidney stones   . Hypothyroidism   . Stenosis of left vertebral artery   . Stroke [facility])    2011  . Type 2 diabetes mellitus ([place])       Past Surgical History:  Procedure Laterality Date  . APPENDECTOMY    . COLON RESECTION     For diverticulitis  . COLON SURGERY    . ESOPHAGOGASTRODUODENOSCOPY N/A 10/03/2018   Procedure:  ESOPHAGOGASTRODUODENOSCOPY (EGD);  Surgeon: [name], MD;  Location: [name];  Service: Endoscopy;  Laterality: N/A;  . EUS N/A 10/03/2018   Procedure: UPPER ENDOSCOPIC ULTRASOUND (EUS) RADIAL;  Surgeon: [name], MD;  Location: [facility];  Service: Endoscopy;  Laterality: N/A;  . FINE NEEDLE ASPIRATION N/A 10/03/2018   Procedure: FINE NEEDLE ASPIRATION (FNA) LINEAR;  Surgeon: [name], MD;  Location: [facility];  Service: Endoscopy;  Laterality: N/A;  . IR ANGIO INTRA EXTRACRAN SEL COM CAROTID INNOMINATE BILAT MOD SED  01/21/2018  . IR ANGIO INTRA EXTRACRAN SEL COM CAROTID INNOMINATE UNI R MOD SED  03/21/2018  . IR ANGIO VERTEBRAL SEL VERTEBRAL BILAT MOD SED  01/21/2018  . IR TRANSCATH EXCRAN VERT OR CAR A STENT  03/21/2018  . RADIOLOGY WITH ANESTHESIA N/A 03/21/2018   Procedure: IR WITH ANESTHESIA WITH STENT PLACEMENT;  Surgeon: Luanne Bras, MD;  Location: [facility];  Service: Radiology;  Laterality: N/A;  .  ROTATOR CUFF REPAIR     Left      Social History:      Social History   Tobacco Use  . Smoking status: Never Smoker  . Smokeless tobacco: Never Used  Substance Use Topics  . Alcohol use: Not Currently       Family History :     Family History  Problem Relation Age of Onset  . Stroke Mother   . Pancreatic cancer Father 41       d. 26  . Stroke Maternal Grandmother   . Heart attack Maternal Grandfather   . Heart Problems Paternal Grandfather   . Scoliosis Daughter   . Muscular dystrophy Grandson       Home Medications:   Prior to Admission medications   Medication Sig Start Date End Date Taking? Authorizing Provider  aspirin 81 MG tablet Take 1 tablet (81 mg total) by mouth daily. 03/22/18  Yes Louk, Alexandra M, PA-C  atorvastatin (LIPITOR) 80 MG tablet Take 1 tablet (80 mg total) by mouth daily at 6 PM. Patient taking differently: Take 80 mg by mouth daily.  01/22/18  Yes Donzetta Starch, NP  ECHINACEA EXTRACT PO Take 2 tablets by  mouth daily.    Yes [provider]  gabapentin (NEURONTIN) 300 MG capsule Take 1 capsule (300 mg total) by mouth 2 (two) times daily. 08/29/18  Yes Venancio Poisson, NP  glipiZIDE (GLUCOTROL) 10 MG tablet Take 10 mg by mouth See admin instructions. Take 10 mg in the morning, may take an additional 10 mg in the evening as needed for blood sugar over 240 09/09/18  Yes [provider]  insulin degludec (TRESIBA FLEXTOUCH) 100 UNIT/ML SOPN FlexTouch Pen Inject 0.2 mLs (20 Units total) into the skin daily. 08/20/18  Yes Cassandria Anger, MD  NP THYROID 30 MG tablet Take 30 mg by mouth daily before breakfast.  01/17/18  Yes [provider]  sertraline (ZOLOFT) 50 MG tablet Take 50 mg by mouth every morning.    Yes [provider]  ticagrelor (BRILINTA) 90 MG TABS tablet Take 90 mg by mouth 2 (two) times daily.   Yes [provider]  Continuous Blood Gluc Sensor (FREESTYLE LIBRE 14 DAY SENSOR) MISC 1 Device by Does not apply route every 14 (fourteen) days. 01/22/18   Donzetta Starch, NP  Insulin Pen Needle 32G X 4 MM MISC 1 Device by Does not apply route daily. 01/22/18   Donzetta Starch, NP     Allergies:     Allergies  Allergen Reactions  . Demerol [Meperidine Hcl] Other (See Comments)    convulsions     Physical Exam:   Vitals  Blood pressure 101/70, pulse 69, temperature 98 F (36.7 C), temperature source Oral, resp. rate 15, height 5\' 10"  (1.778 m), weight 71.9 kg, SpO2 99 %.  1.  General: Appears in no acute distress  2. Psychiatric: Alert, oriented x3, intact insight and judgment  3. Neurologic: Cranial nerve II through XII grossly intact.  Motor strength 5/5 in all extremities.  4. HEENMT:  Atraumatic normocephalic, extraocular muscles intact  5. Respiratory : Clear to auscultation bilaterally  6. Cardiovascular : S1-S2, regular, no murmur auscultated  7. Gastrointestinal:  Abdomen is soft, nontender, no  organomegaly      Data Review:    CBC Recent Labs  Lab 11/11/18 2043  WBC 2.3*  HGB 13.8  HCT 39.6  PLT 95*  MCV 91.7  MCH 31.9  MCHC 34.8  RDW 13.8  LYMPHSABS 0.6*  MONOABS 0.1  EOSABS 0.0  BASOSABS 0.0   ------------------------------------------------------------------------------------------------------------------  Results for orders placed or performed during the hospital encounter of 11/11/18 (from the past 48 hour(s))  CBG monitoring, ED     Status: Abnormal   Collection Time: 11/11/18  8:14 PM  Result Value Ref Range   Glucose-Capillary 171 (H) 70 - 99 mg/dL  CBC with Differential/Platelet     Status: Abnormal   Collection Time: 11/11/18  8:43 PM  Result Value Ref Range   WBC 2.3 (L) 4.0 - 10.5 K/uL   RBC 4.32 4.22 - 5.81 MIL/uL   Hemoglobin 13.8 13.0 - 17.0 g/dL   HCT 39.6 39.0 - 52.0 %   MCV 91.7 80.0 - 100.0 fL   MCH 31.9 26.0 - 34.0 pg   MCHC 34.8 30.0 - 36.0 g/dL   RDW 13.8 11.5 - 15.5 %   Platelets 95 (L) 150 - 400 K/uL    Comment: PLATELET COUNT CONFIRMED BY SMEAR SPECIMEN CHECKED FOR CLOTS Immature Platelet Fraction may be clinically indicated, consider ordering this additional test [id number]    nRBC 0.0 0.0 - 0.2 %   Neutrophils Relative % 65 %   Neutro Abs 1.5 (L) 1.7 - 7.7 K/uL   Lymphocytes Relative 27 %   Lymphs Abs 0.6 (L) 0.7 - 4.0 K/uL   Monocytes Relative 5 %   Monocytes Absolute 0.1 0.1 - 1.0 K/uL   Eosinophils Relative 2 %   Eosinophils Absolute 0.0 0.0 - 0.5 K/uL   Basophils Relative 1 %   Basophils Absolute 0.0 0.0 - 0.1 K/uL   Immature Granulocytes 0 %   Abs Immature Granulocytes 0.00 0.00 - 0.07 K/uL    Comment: Performed at [facility], [address], [place], [place] [address]  Comprehensive metabolic panel     Status: Abnormal   Collection Time: 11/11/18  8:43 PM  Result Value Ref Range   Sodium 136 135 - 145 mmol/L   Potassium 3.6 3.5 - 5.1 mmol/L   Chloride 103 98 - 111 mmol/L   CO2 25 22 - 32 mmol/L    Glucose, Bld 200 (H) 70 - 99 mg/dL   BUN 17 8 - 23 mg/dL   Creatinine, Ser 0.63 0.61 - 1.24 mg/dL   Calcium 8.7 (L) 8.9 - 10.3 mg/dL   Total Protein 6.6 6.5 - 8.1 g/dL   Albumin 4.0 3.5 - 5.0 g/dL   AST 13 (L) 15 - 41 U/L   ALT 16 0 - 44 U/L   Alkaline Phosphatase 58 38 - 126 U/L   Total Bilirubin 1.8 (H) 0.3 - 1.2 mg/dL   GFR calc non Af Amer >60 >60 mL/min   GFR calc Af Amer >60 >60 mL/min   Anion gap 8 5 - 15    Comment: Performed at [facility], [address], [place], [place] [address]  Troponin I - ONCE - STAT     Status: None   Collection Time: 11/11/18  8:43 PM  Result Value Ref Range   Troponin I <0.03 <0.03 ng/mL    Comment: Performed at [facility], [address] [place], [place] [address]  Lactic acid, plasma     Status: None   Collection Time: 11/11/18  8:43 PM  Result Value Ref Range   Lactic Acid, Venous 1.4 0.5 - 1.9 mmol/L    Comment: Performed at [facility], [address], [place], [place] [address]  Ethanol     Status: None  Collection Time: 11/11/18  8:43 PM  Result Value Ref Range   Alcohol, Ethyl (B) <10 <10 mg/dL    Comment: Performed at [facility], [address] [place], [place] [address]  I-stat troponin, ED     Status: None   Collection Time: 11/11/18  9:01 PM  Result Value Ref Range   Troponin i, poc 0.00 0.00 - 0.08 ng/mL   Comment 3            Comment: Due to the release kinetics of cTnI, a negative result within the first hours of the onset of symptoms does not rule out myocardial infarction with certainty. If myocardial infarction is still suspected, repeat the test at appropriate intervals.     Chemistries  Recent Labs  Lab 11/11/18 2043  NA 136  K 3.6  CL 103  CO2 25  GLUCOSE 200*  BUN 17  CREATININE 0.63  CALCIUM 8.7*  AST 13*  ALT 16  ALKPHOS 58  BILITOT 1.8*    ------------------------------------------------------------------------------------------------------------------  ------------------------------------------------------------------------------------------------------------------ GFR: Estimated Creatinine Clearance: 96.1 mL/min (by C-G formula based on SCr of 0.63 mg/dL). Liver Function Tests: Recent Labs  Lab 11/11/18 2043  AST 13*  ALT 16  ALKPHOS 58  BILITOT 1.8*  PROT 6.6  ALBUMIN 4.0   No results for input(s): LIPASE, AMYLASE in the last 168 hours. No results for input(s): AMMONIA in the last 168 hours. Coagulation Profile: No results for input(s): INR, PROTIME in the last 168 hours. Cardiac Enzymes: Recent Labs  Lab 11/11/18 2043  TROPONINI <0.03   BNP (last 3 results) No results for input(s): PROBNP in the last 8760 hours. HbA1C: No results for input(s): HGBA1C in the last 72 hours. CBG: Recent Labs  Lab 11/11/18 2014  GLUCAP 171*   Lipid Profile: No results for input(s): CHOL, HDL, LDLCALC, TRIG, CHOLHDL, LDLDIRECT in the last 72 hours. Thyroid Function Tests: No results for input(s): TSH, T4TOTAL, FREET4, T3FREE, THYROIDAB in the last 72 hours. Anemia Panel: No results for input(s): VITAMINB12, FOLATE, FERRITIN, TIBC, IRON, RETICCTPCT in the last 72 hours.  --------------------------------------------------------------------------------------------------------------- Urine analysis:    Component Value Date/Time   COLORURINE YELLOW 01/20/2018 0017   APPEARANCEUR CLEAR 01/20/2018 0017   LABSPEC 1.017 01/20/2018 0017   PHURINE 6.0 01/20/2018 0017   GLUCOSEU >=500 (A) 01/20/2018 0017   HGBUR NEGATIVE 01/20/2018 0017   BILIRUBINUR NEGATIVE 01/20/2018 0017   KETONESUR NEGATIVE 01/20/2018 0017   PROTEINUR NEGATIVE 01/20/2018 0017   NITRITE NEGATIVE 01/20/2018 0017   LEUKOCYTESUR NEGATIVE 01/20/2018 0017      Imaging Results:    Dg Chest 2 View  Result Date: 11/11/2018 CLINICAL DATA:  Weakness.  EXAM: CHEST - 2 VIEW COMPARISON:  None. FINDINGS: The heart size and mediastinal contours are within normal limits. Both lungs are clear. The visualized skeletal structures are unremarkable. IMPRESSION: No active cardiopulmonary disease. Electronically Signed   By: Marijo Conception, M.D.   On: 11/11/2018 21:21   Ct Head Wo Contrast  Result Date: 11/11/2018 CLINICAL DATA:  Altered level of consciousness, lethargy EXAM: CT HEAD WITHOUT CONTRAST TECHNIQUE: Contiguous axial images were obtained from the base of the skull through the vertex without intravenous contrast. COMPARISON:  CT brain 06/03/2018 FINDINGS: Brain: No acute territorial infarction, hemorrhage or intracranial mass. 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 Mild small vessel ischemic changes of the white matter. Mild atrophy. Stable ventricle size Vascular: No hyperdense vessels.  Carotid vascular calcification Skull: Normal. Negative for fracture or focal lesion.  Sinuses/Orbits: Mucosal thickening in the ethmoid sinuses Other: None IMPRESSION: 1. No CT evidence for acute intracranial abnormality. 2. Atrophy and chronic infarct in the left posterior frontal lobe. Electronically Signed   By: Donavan Foil M.D.   On: 11/11/2018 21:02    My personal review of EKG: Rhythm NSR, no ST changes   Assessment & Plan:    Active Problems:   Generalized weakness   Syncope   1. Syncope-no clear etiology noted.  CT head is unremarkable.  Patient was hypotensive when he came to ED.  Check orthostatic vital signs every shift.  Monitor on telemetry.  Serial troponin every 6 hours x3.  Echocardiogram in a.m.  He has history of MCA strokelike symptoms in the past.  He is on Brilinta for stenting of left vertebral artery stenosis.  Will obtain MRI brain in a.m.  2. Pancreatic cancer-patient has adenocarcinoma of pancreatic body, pancreatic cancer contained within the pancreas.  Mild elevation of CA-19-9 as per  oncology note.  Plan for Whipple procedure in the near future as per patient.  3. Generalized weakness-no clear etiology noted, patient has good p.o. intake.  Will obtain PT evaluation in a.m.  4. Left vertebral artery stenosis-status post stenting of left vertebral artery, continue aspirin, Brilinta.  5. Diabetes mellitus type 2-check CBG every 6 hours, sliding scale insulin with NovoLog.  6. Hyperlipidemia-continue atorvastatin.  7. Pancytopenia-likely from alcohol use, versus MDS as per oncology note.  Will monitor in the hospital.    DVT Prophylaxis-   SCDs  AM Labs Ordered, also please review Full Orders  Family Communication: Admission, patients condition and plan of care including tests being ordered have been discussed with the patient  who indicate understanding and agree with the plan and Code Status.  Code Status: Full code  Admission status: Observation: Based on patients clinical presentation and evaluation of above clinical data, I have made determination that patient will need less than 2 midnight stay in the hospital. Time spent in minutes : 60 minutes   Oswald Hillock M.D on 11/12/2018 at 12:58 AM

## 2018-11-13 ENCOUNTER — Encounter: Payer: Self-pay | Admitting: General Practice

## 2018-11-13 ENCOUNTER — Inpatient Hospital Stay: Payer: PRIVATE HEALTH INSURANCE

## 2018-11-13 ENCOUNTER — Inpatient Hospital Stay: Payer: PRIVATE HEALTH INSURANCE | Admitting: General Practice

## 2018-11-13 ENCOUNTER — Inpatient Hospital Stay: Payer: PRIVATE HEALTH INSURANCE | Attending: Hematology and Oncology | Admitting: Hematology

## 2018-11-13 ENCOUNTER — Ambulatory Visit: Payer: Self-pay | Admitting: Urology

## 2018-11-13 ENCOUNTER — Encounter: Payer: Self-pay | Admitting: Hematology

## 2018-11-13 ENCOUNTER — Other Ambulatory Visit: Payer: Self-pay

## 2018-11-13 VITALS — BP 119/76 | HR 70 | Temp 97.8°F | Resp 18 | Ht 70.0 in | Wt 160.3 lb

## 2018-11-13 DIAGNOSIS — C259 Malignant neoplasm of pancreas, unspecified: Secondary | ICD-10-CM

## 2018-11-13 DIAGNOSIS — Z794 Long term (current) use of insulin: Secondary | ICD-10-CM

## 2018-11-13 DIAGNOSIS — F1010 Alcohol abuse, uncomplicated: Secondary | ICD-10-CM | POA: Diagnosis not present

## 2018-11-13 DIAGNOSIS — Z7982 Long term (current) use of aspirin: Secondary | ICD-10-CM | POA: Diagnosis not present

## 2018-11-13 DIAGNOSIS — Z79899 Other long term (current) drug therapy: Secondary | ICD-10-CM | POA: Insufficient documentation

## 2018-11-13 DIAGNOSIS — C251 Malignant neoplasm of body of pancreas: Secondary | ICD-10-CM | POA: Insufficient documentation

## 2018-11-13 DIAGNOSIS — D696 Thrombocytopenia, unspecified: Secondary | ICD-10-CM | POA: Diagnosis not present

## 2018-11-13 DIAGNOSIS — D72819 Decreased white blood cell count, unspecified: Secondary | ICD-10-CM | POA: Insufficient documentation

## 2018-11-13 LAB — CMP (CANCER CENTER ONLY)
ALT: 10 U/L (ref 0–44)
AST: 11 U/L — ABNORMAL LOW (ref 15–41)
Albumin: 3.8 g/dL (ref 3.5–5.0)
Alkaline Phosphatase: 69 U/L (ref 38–126)
Anion gap: 7 (ref 5–15)
BUN: 10 mg/dL (ref 8–23)
CO2: 24 mmol/L (ref 22–32)
Calcium: 8.8 mg/dL — ABNORMAL LOW (ref 8.9–10.3)
Chloride: 106 mmol/L (ref 98–111)
Creatinine: 0.82 mg/dL (ref 0.61–1.24)
GFR, Est AFR Am: >60 mL/min (ref >60)
Glucose, Bld: 219 mg/dL — ABNORMAL HIGH (ref 70–99)
Potassium: 4 mmol/L (ref 3.5–5.1)
Sodium: 137 mmol/L (ref 135–145)
Total Bilirubin: 1.9 mg/dL — ABNORMAL HIGH (ref 0.3–1.2)
Total Protein: 6.6 g/dL (ref 6.5–8.1)

## 2018-11-13 LAB — CBC WITH DIFFERENTIAL (CANCER CENTER ONLY)
Abs Immature Granulocytes: 0 10*3/uL (ref 0.00–0.07)
Basophils Absolute: 0 10*3/uL (ref 0.0–0.1)
Basophils Relative: 0 %
Eosinophils Absolute: 0.1 10*3/uL (ref 0.0–0.5)
Eosinophils Relative: 2 %
HCT: 37.2 % — ABNORMAL LOW (ref 39.0–52.0)
HEMOGLOBIN: 13.2 g/dL (ref 13.0–17.0)
Immature Granulocytes: 0 %
LYMPHS PCT: 41 %
Lymphs Abs: 1.2 10*3/uL (ref 0.7–4.0)
MCH: 32.6 pg (ref 26.0–34.0)
MCHC: 35.5 g/dL (ref 30.0–36.0)
MCV: 91.9 fL (ref 80.0–100.0)
Monocytes Absolute: 0.2 10*3/uL (ref 0.1–1.0)
Monocytes Relative: 5 %
Neutro Abs: 1.6 10*3/uL — ABNORMAL LOW (ref 1.7–7.7)
Neutrophils Relative %: 52 %
Platelet Count: 95 10*3/uL — ABNORMAL LOW (ref 150–400)
RBC: 4.05 MIL/uL — ABNORMAL LOW (ref 4.22–5.81)
RDW: 13.8 % (ref 11.5–15.5)
WBC Count: 3 10*3/uL — ABNORMAL LOW (ref 4.0–10.5)
nRBC: 0 % (ref 0.0–0.2)

## 2018-11-13 LAB — BASIC METABOLIC PANEL
Anion gap: 5 (ref 5–15)
BUN: 10 mg/dL (ref 8–23)
CO2: 24 mmol/L (ref 22–32)
Calcium: 8.4 mg/dL — ABNORMAL LOW (ref 8.9–10.3)
Chloride: 110 mmol/L (ref 98–111)
Creatinine, Ser: 0.67 mg/dL (ref 0.61–1.24)
GFR calc Af Amer: >60 mL/min (ref >60)
GLUCOSE: 174 mg/dL — AB (ref 70–99)
Potassium: 3.6 mmol/L (ref 3.5–5.1)
Sodium: 139 mmol/L (ref 135–145)

## 2018-11-13 LAB — GLUCOSE, CAPILLARY
GLUCOSE-CAPILLARY: 273 mg/dL — AB (ref 70–99)
Glucose-Capillary: 148 mg/dL — ABNORMAL HIGH (ref 70–99)

## 2018-11-13 LAB — CBC
HCT: 37.9 % — ABNORMAL LOW (ref 39.0–52.0)
HEMOGLOBIN: 13.1 g/dL (ref 13.0–17.0)
MCH: 32.7 pg (ref 26.0–34.0)
MCHC: 34.6 g/dL (ref 30.0–36.0)
MCV: 94.5 fL (ref 80.0–100.0)
Platelets: 104 10*3/uL — ABNORMAL LOW (ref 150–400)
RBC: 4.01 MIL/uL — ABNORMAL LOW (ref 4.22–5.81)
RDW: 14 % (ref 11.5–15.5)
WBC: 3.2 10*3/uL — ABNORMAL LOW (ref 4.0–10.5)
nRBC: 0 % (ref 0.0–0.2)

## 2018-11-13 NOTE — Discharge Summary (Signed)
Physician Discharge Summary  Mark Foster BHA:[id number] DOB: 1956/07/06 DOA: 11/11/2018  PCP: Doree Albee, MD  Admit date: 11/11/2018  Discharge date: 11/13/2018  Admitted From:Home  Disposition:  Home  Recommendations for Outpatient Follow-up:  1. Follow up with PCP in 1-2 weeks and follow-up cortisol testing 2. Follow-up with oncology as scheduled 3. Continue to wear TED hoses at home  Home Health: None  Equipment/Devices: None  Discharge Condition: Stable  CODE STATUS: Full  Diet recommendation: Heart Healthy/carb modified  Brief/Interim Summary: Per HPI:  Mark Foster  is a 63 y.o. 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 As per patient he has been feeling weak over the past few days, today when his sister showed up at his house he went to open the door, as soon as he opened the door he was on the floor and does not remember events after that. He denies chest pain, Denies shortness of breath, Denies nausea vomiting or diarrhea, Denies palpitations. He was found to have a glucose of 105. In the ED, lab work is unremarkable.  EKG is unremarkable  Patient was admitted for orthostatic syncope which has now improved with IV fluid hydration.  2D echocardiogram with no significant findings and LVEF of 55 to 60%.  Serum cortisol level still pending.  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  He has been instructed to wear his TED hoses on a consistent basis which appears to be helping.  He is able to now ambulate without any further symptomatology as well.  He is  otherwise ready for discharge and has had no other acute events during the course of this admission.  Discharge Diagnoses:  Active Problems:   Generalized weakness   Syncope  Principal discharge diagnosis: Orthostatic syncope likely neurogenic.  Discharge Instructions  Discharge Instructions    Diet - low sodium heart healthy   Complete by:  As directed    Increase activity slowly   Complete by:  As directed      Allergies as of 11/13/2018      Reactions   Demerol [meperidine Hcl] Other (See Comments)   convulsions      Medication List    TAKE these medications   aspirin 81 MG tablet Take 1 tablet (81 mg total) by mouth daily.   atorvastatin 80 MG tablet Commonly known as:  LIPITOR Take 1 tablet (80 mg total) by mouth daily at 6 PM. What changed:  when to take this   ECHINACEA EXTRACT PO Take 2 tablets by mouth daily.   FreeStyle Libre 14 Day Sensor Misc 1 Device by Does not apply route every 14 (fourteen) days.   gabapentin 300 MG capsule Commonly known as:  NEURONTIN Take 1 capsule (300 mg total) by mouth 2 (two) times daily.   glipiZIDE 10 MG tablet Commonly known as:  GLUCOTROL Take 10 mg by mouth See admin instructions. Take 10 mg in the morning, may take an additional 10 mg in the evening as needed for blood sugar over 240   insulin degludec 100 UNIT/ML Sopn FlexTouch Pen Commonly known as:  Tyler Aas FlexTouch Inject 0.2 mLs (20 Units total) into the  skin daily.   Insulin Pen Needle 32G X 4 MM Misc 1 Device by Does not apply route daily.   NP Thyroid 30 MG tablet Generic drug:  thyroid Take 30 mg by mouth daily before breakfast.   sertraline 50 MG tablet Commonly known as:  ZOLOFT Take 50 mg by mouth every morning.   ticagrelor 90 MG Tabs tablet Commonly known as:  BRILINTA Take 90 mg by mouth 2 (two) times daily.      Follow-up Information    Gosrani, Nimish C, MD Follow up in 1 week(s).   Specialty:  Internal Medicine Contact  information: [place] [address] [phone]          Allergies  Allergen Reactions  . Demerol [Meperidine Hcl] Other (See Comments)    convulsions    Consultations:  None   Procedures/Studies: Dg Chest 2 View  Result Date: 11/11/2018 CLINICAL DATA:  Weakness. EXAM: CHEST - 2 VIEW COMPARISON:  None. FINDINGS: The heart size and mediastinal contours are within normal limits. Both lungs are clear. The visualized skeletal structures are unremarkable. IMPRESSION: No active cardiopulmonary disease. Electronically Signed   By: Marijo Conception, M.D.   On: 11/11/2018 21:21   Ct Head Wo Contrast  Result Date: 11/11/2018 CLINICAL DATA:  Altered level of consciousness, lethargy EXAM: CT HEAD WITHOUT CONTRAST TECHNIQUE: Contiguous axial images were obtained from the base of the skull through the vertex without intravenous contrast. COMPARISON:  CT brain 06/03/2018 FINDINGS: Brain: No acute territorial infarction, hemorrhage or intracranial mass. 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 Mild small vessel ischemic changes of the white matter. Mild atrophy. Stable ventricle size Vascular: No hyperdense vessels.  Carotid vascular calcification Skull: Normal. Negative for fracture or focal lesion. Sinuses/Orbits: Mucosal thickening in the ethmoid sinuses Other: None IMPRESSION: 1. No CT evidence for acute intracranial abnormality. 2. Atrophy and chronic infarct in the left posterior frontal lobe. Electronically Signed   By: Donavan Foil M.D.   On: 11/11/2018 21:02   Mr Brain Wo Contrast  Result Date: 11/12/2018 CLINICAL DATA:  Altered level of consciousness. EXAM: MRI HEAD WITHOUT CONTRAST TECHNIQUE: Multiplanar, multiecho pulse sequences of the brain and surrounding structures were obtained without intravenous contrast. COMPARISON:  MRI head 01/20/2018, CT 11/11/2018 FINDINGS: Brain: Negative for acute infarct. Chronic infarct left parietal  lobe. Minimal chronic changes in the white matter. Negative for hemorrhage or mass. Ventricle size normal. 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 Otherwise normal flow voids at the skull base. Skull and upper cervical spine: Negative Sinuses/Orbits: Mild mucosal edema paranasal sinuses.  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 Electronically Signed   By: Franchot Gallo M.D.   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 EXAM: NUCLEAR MEDICINE PET SKULL BASE TO THIGH TECHNIQUE: 8.0 mCi F-18 FDG was injected intravenously. Full-ring PET imaging was performed from the skull base to thigh after the radiotracer. CT data was obtained and used for attenuation correction and anatomic localization. Fasting blood glucose: 126 mg/dl COMPARISON:  MRI 08/30/2018. FINDINGS: Mediastinal blood pool activity: SUV max 2.0 NECK: No hypermetabolic lymph nodes in the neck. Incidental CT findings: none CHEST: No hypermetabolic mediastinal or hilar nodes. No suspicious pulmonary nodules on the CT scan. Incidental CT findings:  There is abdominal aortic atherosclerosis without aneurysm. Coronary artery calcification is evident. Emphysema noted in the lungs bilaterally. ABDOMEN/PELVIS: No substantial hypermetabolism identified in the body of pancreas. A small focus of FDG accumulation in the pancreatic parenchyma is identified in the region of the abrupt pancreatic duct cutoff. This demonstrates SUV max = 2.2. No focal hypermetabolic disease in the liver. No evidence for hypermetabolic lymphadenopathy in the abdomen or pelvis. Incidental CT findings: 7 mm calcified gallstone noted. There is abdominal aortic atherosclerosis without aneurysm. Prostate gland is enlarged.  SKELETON: No focal hypermetabolic activity to suggest skeletal metastasis. Incidental CT findings: none IMPRESSION: 1. Tiny focus of hypermetabolism identified in the body of pancreas, adjacent to the abrupt cut off of the main pancreatic duct. No evidence for hypermetabolic metastatic disease in the neck, chest, abdomen, or pelvis. 2. Cholelithiasis. 3.  Aortic Atherosclerois (ICD10-170.0) 4. Prostatomegaly Electronically Signed   By: Misty Stanley M.D.   On: 10/17/2018 13:15     Discharge Exam: Vitals:   11/13/18 0603 11/13/18 0604  BP: (!) 120/108 (!) 148/133  Pulse: 78 84  Resp:    Temp:    SpO2: 98% 98%   Vitals:   11/13/18 0600 11/13/18 0602 11/13/18 0603 11/13/18 0604  BP: 114/73 97/72 (!) 120/108 (!) 148/133  Pulse: 74 78 78 84  Resp: 20     Temp: 98.5 F (36.9 C)     TempSrc: Oral     SpO2: 98% 98% 98% 98%  Weight:      Height:        General: Pt is alert, awake, not in acute distress Cardiovascular: RRR, S1/S2 +, no rubs, no gallops Respiratory: CTA bilaterally, no wheezing, no rhonchi Abdominal: Soft, NT, ND, bowel sounds + Extremities: no edema, no cyanosis    The results of significant diagnostics from this hospitalization (including imaging, microbiology, ancillary and laboratory) are listed below for reference.     Microbiology: No results found for this or any previous visit (from the past 240 hour(s)).   Labs: BNP (last 3 results) No results for input(s): BNP in the last 8760 hours. Basic Metabolic Panel: Recent Labs  Lab 11/11/18 2043 11/12/18 0109 11/13/18 0405  NA 136 140 139  K 3.6 3.9 3.6  CL 103 108 110  CO2 25 26 24   GLUCOSE 200* 123* 174*  BUN 17 14 10   CREATININE 0.63 0.55* 0.67  CALCIUM 8.7* 8.5* 8.4*   Liver Function Tests: Recent Labs  Lab 11/11/18 2043 11/12/18 0109  AST 13* 15  ALT 16 15  ALKPHOS 58 55  BILITOT 1.8* 1.6*  PROT 6.6 6.5  ALBUMIN 4.0 3.9   No results for input(s): LIPASE, AMYLASE in the last 168  hours. No results for input(s): AMMONIA in the last 168 hours. CBC: Recent Labs  Lab 11/11/18 2043 11/12/18 0109 11/13/18 0405  WBC 2.3* 2.9* 3.2*  NEUTROABS 1.5*  --   --   HGB 13.8 13.3 13.1  HCT 39.6 38.0* 37.9*  MCV 91.7 91.8 94.5  PLT 95* 98* 104*   Cardiac Enzymes: Recent Labs  Lab 11/11/18 2043 11/12/18 0109 11/12/18 0716 11/12/18 1311  TROPONINI <0.03 <0.03 <0.03 <0.03   BNP: Invalid input(s): POCBNP CBG: Recent Labs  Lab 11/12/18 0604 11/12/18 1115 11/12/18 1606 11/12/18 2349 11/13/18 0611  GLUCAP 96 232* 233* 273* 148*   D-Dimer No results for input(s): DDIMER in the last 72 hours. Hgb A1c Recent Labs    11/12/18 0109  HGBA1C 9.0*   Lipid Profile  No results for input(s): CHOL, HDL, LDLCALC, TRIG, CHOLHDL, LDLDIRECT in the last 72 hours. Thyroid function studies Recent Labs    11/12/18 0109  TSH 1.290   Anemia work up No results for input(s): VITAMINB12, FOLATE, FERRITIN, TIBC, IRON, RETICCTPCT in the last 72 hours. Urinalysis    Component Value Date/Time   COLORURINE YELLOW 11/12/2018 1800   APPEARANCEUR CLEAR 11/12/2018 1800   LABSPEC 1.016 11/12/2018 1800   PHURINE 7.0 11/12/2018 1800   GLUCOSEU >=500 (A) 11/12/2018 1800   HGBUR NEGATIVE 11/12/2018 1800   BILIRUBINUR NEGATIVE 11/12/2018 1800   KETONESUR NEGATIVE 11/12/2018 1800   PROTEINUR NEGATIVE 11/12/2018 1800   NITRITE NEGATIVE 11/12/2018 1800   LEUKOCYTESUR NEGATIVE 11/12/2018 1800   Sepsis Labs Invalid input(s): PROCALCITONIN,  WBC,  LACTICIDVEN Microbiology No results found for this or any previous visit (from the past 240 hour(s)).   Time coordinating discharge: 35 minutes  SIGNED:   Rodena Goldmann, DO [facility] 11/13/2018, 8:24 AM  If 7PM-7AM, please contact night-coverage [email or web address] Password [email or web address]

## 2018-11-13 NOTE — Progress Notes (Signed)
Patient discharged home with instructions given on medications and follow up visits,patient verbalized understanding. Prescriptions sent to Pharmacy of choice documented on AVS. IV discontinued, catheter intact. Accompanied by staff to an awaiting vehicle. 

## 2018-11-13 NOTE — Progress Notes (Signed)
[facility] CSW Progress Notes  CSW met briefly w patient in lobby, provided another gas card from [employer].  Also have Amber to submit on patient behalf - patient aware he needs to provide copy of social security award letter before CSW can submit.  Edwyna Shell, LCSW Clinical Social Worker Phone:  [phone]

## 2018-11-14 ENCOUNTER — Telehealth: Payer: Self-pay | Admitting: Hematology

## 2018-11-14 LAB — CANCER ANTIGEN 19-9: CA 19-9: 65 U/mL — ABNORMAL HIGH (ref 0–35)

## 2018-11-14 NOTE — Telephone Encounter (Signed)
Added lab/followup for 04/08 and confirmed with patient.

## 2018-11-15 ENCOUNTER — Encounter: Payer: Self-pay | Admitting: Genetic Counselor

## 2018-11-15 ENCOUNTER — Telehealth: Payer: Self-pay | Admitting: Genetic Counselor

## 2018-11-15 ENCOUNTER — Encounter: Payer: Self-pay | Admitting: General Practice

## 2018-11-15 DIAGNOSIS — Z1379 Encounter for other screening for genetic and chromosomal anomalies: Secondary | ICD-10-CM | POA: Insufficient documentation

## 2018-11-15 NOTE — Telephone Encounter (Signed)
Revealed negative genetic testing.  Discussed that we do not know why he has pancreatic cancer or why there is cancer in the family. It could be due to a different gene that we are not testing, or maybe our current technology may not be able to pick something up.  It will be important for him to keep in contact with genetics to keep up with whether additional testing may be needed.  There is one BRCA1 VUS.  This is still considered a normal result. We will reflex to a pancreatic cancer panel that contains genes for pancreatitis and those that are also preliminary.

## 2018-11-15 NOTE — Progress Notes (Signed)
[facility] CSW Progress Notes  Patient provided income verification documents, application for [place] faxed to agency.  Edwyna Shell, LCSW Clinical Social Worker Phone:  [phone]

## 2018-11-19 ENCOUNTER — Ambulatory Visit: Payer: Self-pay | Admitting: Genetic Counselor

## 2018-11-19 ENCOUNTER — Telehealth: Payer: Self-pay | Admitting: Genetic Counselor

## 2018-11-19 ENCOUNTER — Encounter: Payer: Self-pay | Admitting: Genetic Counselor

## 2018-11-19 ENCOUNTER — Telehealth: Payer: Self-pay | Admitting: *Deleted

## 2018-11-19 DIAGNOSIS — Z1379 Encounter for other screening for genetic and chromosomal anomalies: Secondary | ICD-10-CM

## 2018-11-19 NOTE — Telephone Encounter (Signed)
Mark Foster left a message for Dr Maylon Peppers to let him know surgery have been scheduled for Wednesday

## 2018-11-19 NOTE — Telephone Encounter (Signed)
Great. Thank you for letting me know.  Dr. Maylon Peppers

## 2018-11-19 NOTE — Progress Notes (Signed)
HPI:  [name] was previously seen in the [place] clinic due to a personal and family history of pancreatic cancer and concerns regarding a hereditary predisposition to cancer. Please refer to our prior cancer genetics clinic note for more information regarding our discussion, assessment and recommendations, at the time. [name] recent genetic test results were disclosed to him, as were recommendations warranted by these results. These results and recommendations are discussed in more detail below.  CANCER HISTORY:    Pancreatic adenocarcinoma (Mark Foster)   08/20/2018 Imaging    CT abdomen/pelvis w/ contrast: IMPRESSION: Atrophy and ductal dilatation involving the pancreatic tail, with suspected small soft tissue mass in the pancreatic body which could represent pancreatic carcinoma. Abdomen MRI and MRCP without and with contrast is recommended for further evaluation.  No evidence of hepatobiliary disease.    08/30/2018 Imaging    MRI abdomen w/ contrast: IMPRESSION: 1. Although not definitive, there remains concern of a small hypoenhancing mass at the junction of the pancreatic body and tail associated with atrophy and ductal dilatation in the pancreatic tail. This remains concerning for pancreatic neoplasm. Postinflammatory stricture less likely. Besides a tiny cystic lesion in the pancreatic tail, there are no other signs of previous pancreatitis. Further evaluation with endoscopic ultrasound for possible biopsy strongly recommended. 2. No evidence of metastatic disease. 3. Cholelithiasis without evidence of cholecystitis or biliary dilatation.    09/03/2018 Initial Diagnosis    Pancreatic adenocarcinoma (Mark Foster)    10/03/2018 Procedure    EUS: - 2.6cm irregularly shaped mass in the body of the pancreas that causing main pancreatic duct obstruction and dilation. The mass abuts the splenic vessels but no other significant vascular structures and it was sampled  with trangastric EUS FNA. Preliminary cytology is + for malignancy, likely well-differentiated adenocarcinoma. It appears surgically resectable.    10/03/2018 Pathology Results    Accession: [id number]  FINE NEEDLE ASPIRATION, ENDOSCOPIC, PANCREAS BODY (SPECIMEN 1 OF 1 COLLECTED 10/03/18): ADENOCARCINOMA.    10/17/2018 Imaging    PET: IMPRESSION: 1. Tiny focus of hypermetabolism identified in the body of pancreas, adjacent to the abrupt cut off of the main pancreatic duct. No evidence for hypermetabolic metastatic disease in the neck, chest, abdomen, or pelvis. 2. Cholelithiasis. 3.  Aortic Atherosclerois (ICD10-170.0) 4. Prostatomegaly    11/12/2018 Genetic Testing    BRCA1 VUS identified on the common hereditary cancer panel.  The Hereditary Gene Panel offered by [facility] includes sequencing and/or deletion duplication testing of the following 47 genes: APC, ATM, AXIN2, BARD1, BMPR1A, BRCA1, BRCA2, BRIP1, CDH1, CDK4, CDKN2A (p14ARF), CDKN2A (p16INK4a), CHEK2, CTNNA1, DICER1, EPCAM (Deletion/duplication testing only), GREM1 (promoter region deletion/duplication testing only), KIT, MEN1, MLH1, MSH2, MSH3, MSH6, MUTYH, NBN, NF1, NHTL1, PALB2, PDGFRA, PMS2, POLD1, POLE, PTEN, RAD50, RAD51C, RAD51D, SDHB, SDHC, SDHD, SMAD4, SMARCA4. STK11, TP53, TSC1, TSC2, and VHL.  The following genes were evaluated for sequence changes only: SDHA and HOXB13 c.251G>A variant only. The report date is November 12, 2018.     FAMILY HISTORY:  We obtained a detailed, 4-generation family history.  Significant diagnoses are listed below: Family History  Problem Relation Age of Onset   Stroke Mother    Pancreatic cancer Father 37       d. 20   Stroke Maternal Grandmother    Heart attack Maternal Grandfather    Heart Problems Paternal Grandfather    Scoliosis Daughter    Muscular dystrophy Grandson     The patient has two sons and a daughter who are  cancer free.  He has one sister who he has lost track of  20+ years ago. Both parents are deceased.  The patient's mother had polio as a child, and died of a stroke.  She had a brother and two sisters, that the patient has not been in contact with in decades.  The maternal grandparent are deceased from non cancer related issues.  The patient's father died of pancreatic cancer at 69.  He had three sisters and a brother, none had cancer that the patient is aware of.  The paternal grandparents are deceased.  [name] is unaware of previous family history of genetic testing for hereditary cancer risks. Patient's maternal ancestors are of [place] descent, and paternal ancestors are of Caucasian descent. There is no reported Ashkenazi Jewish ancestry. There is no known consanguinity.   GENETIC TEST RESULTS: Genetic testing reported out on November 15, 2018 through the common hereditary cancer panel and Chronic pancreatitis and preliminary pancreatic cancer panel found no pathogenic mutations. The Hereditary Gene Panel offered by [facility] includes sequencing and/or deletion duplication testing of the following 55 genes: APC, ATM, AXIN2, BARD1, BMPR1A, BRCA1, BRCA2, BRIP1, CASR, CDH1, CDK4, CDKN2A (p14ARF), CDKN2A (p16INK4a), CFTR, CHEK2, CPA1, CTNNA1, CTRC, DICER1, EPCAM (Deletion/duplication testing only), FANCC, GREM1 (promoter region deletion/duplication testing only), KIT, MEN1, MLH1, MSH2, MSH3, MSH6, MUTYH, NBN, NF1, NHTL1, PALB2, PALLD, PDGFRA, PMS2, POLD1, POLE, PRSS1, PTEN, RAD50, RAD51C, RAD51D, SDHB, SDHC, SDHD, SMAD4, SMARCA4. SPINK1, STK11, TP53, TSC1, TSC2, and VHL.  The following genes were evaluated for sequence changes only: SDHA and HOXB13 c.251G>A variant only. The test report has been scanned into EPIC and is located under the Molecular Pathology section of the Results Review tab.  A portion of the result report is included below for reference.     We discussed with Mark Foster that because current genetic testing is not perfect, it is possible  there may be a gene mutation in one of these genes that current testing cannot detect, but that chance is small.  We also discussed, that there could be another gene that has not yet been discovered, or that we have not yet tested, that is responsible for the cancer diagnoses in the family. It is also possible there is a hereditary cause for the cancer in the family that Mark Foster did not inherit and therefore was not identified in his testing.  Therefore, it is important to remain in touch with cancer genetics in the future so that we can continue to offer Mark Foster the most up to date genetic testing.   Genetic testing did identify a variant of uncertain significance (VUS) was identified in the BRCA1 gene called c.1259A>G.  At this time, it is unknown if this variant is associated with increased cancer risk or if this is a normal finding, but most variants such as this get reclassified to being inconsequential. It should not be used to make medical management decisions. With time, we suspect the lab will determine the significance of this variant, if any. If we do learn more about it, we will try to contact Mark Foster to discuss it further. However, it is important to stay in touch with [place] periodically and keep the address and phone number up to date.  CANCER SCREENING RECOMMENDATIONS: [name] test result is considered negative (normal).  This means that we have not identified a hereditary cause for his personal and family history of cancer at this time. Most cancers happen by chance and this negative test suggests  that his cancer may fall into this category.    While reassuring, this does not definitively rule out a hereditary predisposition to cancer. It is still possible that there could be genetic mutations that are undetectable by current technology. There could be genetic mutations in genes that have not been tested or identified to increase cancer risk.  Therefore, it is recommended he continue  to follow the cancer management and screening guidelines provided by his oncology and primary healthcare provider.   An individual's cancer risk and medical management are not determined by genetic test results alone. Overall cancer risk assessment incorporates additional factors, including personal medical history, family history, and any available genetic information that may result in a personalized plan for cancer prevention and surveillance  RECOMMENDATIONS FOR FAMILY MEMBERS:  Individuals in this family might be at some increased risk of developing cancer, over the general population risk, simply due to the family history of cancer.  We recommended women in this family have a yearly mammogram beginning at age 65, or 79 years younger than the earliest onset of cancer, an annual clinical breast exam, and perform monthly breast self-exams. Women in this family should also have a gynecological exam as recommended by their primary provider. All family members should have a colonoscopy by age 85.  FOLLOW-UP: Lastly, we discussed with Mark Foster that cancer genetics is a rapidly advancing field and it is possible that new genetic tests will be appropriate for him and/or his family members in the future. We encouraged him to remain in contact with cancer genetics on an annual basis so we can update his personal and family histories and let him know of advances in cancer genetics that may benefit this family.   Our contact number was provided. Mark Foster questions were answered to his satisfaction, and he knows he is welcome to call us at anytime with additional questions or concerns.   Roma Kayser, MS, [facility] Certified Genetic Counselor [email or web address]

## 2018-11-19 NOTE — Telephone Encounter (Signed)
Revealed negative genetic testing.  Discussed that we do not know why he has pancreatic cancer or why there is cancer in the family. It could be due to a different gene that we are not testing, or maybe our current technology may not be able to pick something up.  It will be important for him to keep in contact with genetics to keep up with whether additional testing may be needed.      

## 2018-11-22 ENCOUNTER — Ambulatory Visit: Payer: BLUE CROSS/BLUE SHIELD | Admitting: "Endocrinology

## 2018-12-03 ENCOUNTER — Telehealth: Payer: Self-pay | Admitting: General Practice

## 2018-12-03 NOTE — Telephone Encounter (Signed)
[facility] CSW Progress Notes  Oceanographer contacted patient to assess for food insecurity and other psychosocial needs during current COVID19 pandemic.  Left VM encouraging patient to return call.  Also informed patient that [place] transportation grant has been submitted and provided contact info for [employer] which has specific COVID impact grant.     Encouraged patient to call if changes occur or they have any other questions/concerns.   Beverely Pace, [place], [place] Worker Phone:  [phone]

## 2018-12-05 NOTE — Progress Notes (Addendum)
[place] OFFICE PROGRESS NOTE  Patient Care Team: Doree Albee, MD as PCP - General (Internal Medicine)  HEME/ONC OVERVIEW: 1. 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 Audelia Hives -08/2018: CT abdomen/pelvis (for leukopenia/thrombocytopenia) showed an incidental pancreatic body soft tissue prominence ~2cm, atrophy and pancreatic ductal dilatation involving pancreatic tail; MRI abdomen showed a 1.8 x 1.8cm area of hypoenhancement at the junction between the pancreatic head and body  -09/2018: EUS showed a 2.6cm mass in the pancreatic body causing pancreatic duct obstruction and dilation; mass abuts the splenic vessels but no other vascular structure; FNA showed adenocarcinoma  -10/2018: PET showed a small hypermetabolic focus within the body of pancreas, no other FDG-avid disease  -11/2018: genetic evaluation showed heterozygous BRCA1 mutation (considered normal); distal subtotal pancreatectomy w/ splenectomy at [facility], path showed well-differentiated adenocarcinoma, 2.8cm, 2/16 LN's positive, pT2pN1  TREATMENT REGIMEN:  11/20/2018: distal subtotal pancreatectomy with splenectomy  Systemic therapy TBD   PERTINENT NON-HEM/ONC PROBLEMS: 1. Extensive cerebrovascular disease 2. Hx of EtOH abuse   ASSESSMENT & PLAN:   Stage IIB (pT2pN1c) adenocarcinoma of the pancreatic body -I reviewed the patient's records from [place] in detail, including operative note and the pathology results -In summary, patient underwent subtotal distal pancreatectomy with splenectomy on 11/20/2018 at [facility].  Path showed well differentiated adenocarcinoma, 2.8 cm, 2/16 LN's positive, pT2pN1 disease.  -Clinically, patient is slowly recovering from the surgery, but is still having mild to moderate pain; furthermore, the inferior edge of the incision has dehisced slightly after he had a coughing spell a few days ago  -I have reached out to the [facility] surgical oncology to  update them of the recent change, and ask for their recommendation on the incision management; furthermore, I will try to clarify the timing of adjuvant chemotherapy from a wound healing standpoint  -I reviewed NCCN guidelines in detail with the patient -As he did not receive any neoadjuvant chemotherapy, adjuvant chemotherapy is recommended to reduce the risk of recurrent and metastatic disease -I reviewed the different adjuvant chemotherapy options with the patient, including FOLFIRINOX and gemcitabine/Abraxane -Given the patient's borderline performance status and baseline cytopenias, he is likely going to tolerate FOLFIRINOX poorly; therefore, I felt that modified adjuvant gemcitabine/Abraxane may be a reasonable option (Day 1 and 15). If he has delayed wound healing and his performance status remains borderline, less aggressive regimen, such as gemcitabine or 5-FU, is also reasonable   -We briefly discussed some of the risks, benefits and side-effects of gemcitabine/Abraxane.  -Some of the short term side-effects included, though not limited to, risk of fatigue, weight loss, tumor lysis syndrome, risk of allergic reactions, pancytopenia, life-threatening infections, need for transfusions of blood products, nausea, vomiting, change in bowel habits, admission to hospital for various reasons, and risks of death.  -Long term side-effects are also discussed including permanent damage to nerve function, chronic fatigue, and rare secondary malignancy including bone marrow disorders.  -The patient is aware that the response rates discussed earlier is not guaranteed.   -I discussed the case with Dr. Zenia Resides of surgical oncology at [facility], who recommended deferring systemic therapy for at least 4-6 weeks to allow adequate wound healing -Given that the patient lives very close to the [facility], I discussed the case with Dr. Tera Helper, who will reach out to the patient to set up follow-up there    Wound dehiscence -Patient has a small wound dehiscence after a recent coughing spell -Dehiscence about 2cm in length,  no bloody or purulent drainage -I discussed the case with Dr. Zenia Resides, and his office will reach out to the patient for further management of the surgical wound   Post-op pain -Patient reports mild to moderate, intermittent pain near the recent surgical site -He has not had any opioid medication for pain -I have prescribed tramadol '50mg'$  TID PRN, as he is opioid-naive; patient understands that he can call the clinic if his pain is not adequately controlled   Post-splenectomy vaccination -Patient received a series of vaccinations prior to splenectomy -He will require vaccinations at 2 months, 3 months and 5 years post-splenectomy   Poorly controlled DM -Glucose 377 today, patient is asymptomatic -I strongly encouraged the patient to contact his endocrinologist for further management of his diabetes, as it can cause impaired wound healing -Patient expressed understanding and agreed with the plan  No orders of the defined types were placed in this encounter.  All questions were answered. The patient knows to call the clinic with any problems, questions or concerns. No barriers to learning was detected.  A total of more than 40 minutes were spent face-to-face with the patient during this encounter and over half of that time was spent on counseling and coordination of care as outlined above.   Return to be determined, pending evaluation with Dr. Tera Helper.   Tish Men, MD 12/11/2018 11:50 AM  CHIEF COMPLAINT: "I am hurting a little"  INTERVAL HISTORY: Ms. Arney Mayabb returns to clinic for follow-up of pancreatic cancer s/p resection.  He underwent distal subtotal pancreatectomy with splenectomy on 11/20/2026 at [facility].  Postop, he recovered relatively unremarkably, and his staples were taken out over a week ago.  He reports that he had a coughing spell about 4 days ago, and  felt that the lower incision popped open.  He has had intermittent, mild to moderate pain near the inferior edge of the distal pancreatectomy site, for which he has been taking Tylenol without adequate pain relief.  He has not taken any opioid medication.  He has been trying to be more active around the house, and is independent with ADLs, but unable to perform any strenuous activities yet.  SUMMARY OF ONCOLOGIC HISTORY:   Pancreatic adenocarcinoma ([place])   08/20/2018 Imaging    CT abdomen/pelvis w/ contrast: IMPRESSION: Atrophy and ductal dilatation involving the pancreatic tail, with suspected small soft tissue mass in the pancreatic body which could represent pancreatic carcinoma. Abdomen MRI and MRCP without and with contrast is recommended for further evaluation.  No evidence of hepatobiliary disease.    08/30/2018 Imaging    MRI abdomen w/ contrast: IMPRESSION: 1. Although not definitive, there remains concern of a small hypoenhancing mass at the junction of the pancreatic body and tail associated with atrophy and ductal dilatation in the pancreatic tail. This remains concerning for pancreatic neoplasm. Postinflammatory stricture less likely. Besides a tiny cystic lesion in the pancreatic tail, there are no other signs of previous pancreatitis. Further evaluation with endoscopic ultrasound for possible biopsy strongly recommended. 2. No evidence of metastatic disease. 3. Cholelithiasis without evidence of cholecystitis or biliary dilatation.    09/03/2018 Initial Diagnosis    Pancreatic adenocarcinoma ([place])    10/03/2018 Procedure    EUS: - 2.6cm irregularly shaped mass in the body of the pancreas that causing main pancreatic duct obstruction and dilation. The mass abuts the splenic vessels but no other significant vascular structures and it was sampled with trangastric EUS FNA. Preliminary cytology is + for malignancy, likely well-differentiated adenocarcinoma.  It appears  surgically resectable.    10/03/2018 Pathology Results    Accession: [id number]  FINE NEEDLE ASPIRATION, ENDOSCOPIC, PANCREAS BODY (SPECIMEN 1 OF 1 COLLECTED 10/03/18): ADENOCARCINOMA.    10/17/2018 Imaging    PET: IMPRESSION: 1. Tiny focus of hypermetabolism identified in the body of pancreas, adjacent to the abrupt cut off of the main pancreatic duct. No evidence for hypermetabolic metastatic disease in the neck, chest, abdomen, or pelvis. 2. Cholelithiasis. 3.  Aortic Atherosclerois (ICD10-170.0) 4. Prostatomegaly    11/12/2018 Genetic Testing    BRCA1 VUS identified on the common hereditary cancer panel.  The Hereditary Gene Panel offered by [facility] includes sequencing and/or deletion duplication testing of the following 47 genes: APC, ATM, AXIN2, BARD1, BMPR1A, BRCA1, BRCA2, BRIP1, CDH1, CDK4, CDKN2A (p14ARF), CDKN2A (p16INK4a), CHEK2, CTNNA1, DICER1, EPCAM (Deletion/duplication testing only), GREM1 (promoter region deletion/duplication testing only), KIT, MEN1, MLH1, MSH2, MSH3, MSH6, MUTYH, NBN, NF1, NHTL1, PALB2, PDGFRA, PMS2, POLD1, POLE, PTEN, RAD50, RAD51C, RAD51D, SDHB, SDHC, SDHD, SMAD4, SMARCA4. STK11, TP53, TSC1, TSC2, and VHL.  The following genes were evaluated for sequence changes only: SDHA and HOXB13 c.251G>A variant only. The report date is November 12, 2018.    11/20/2018 Surgery    Distal subtotal pancreatectomy with splenectomy at [facility]    11/20/2018 Surgery    TUMOR   Tumor Site:  Pancreatic body    Histologic Type:  Ductal adenocarcinoma    Histologic Grade:  G1: Well differentiated    Tumor Size:  Greatest dimension in Centimeters (cm): 2.8 Centimeters (cm)    Additional Dimension in Centimeters (cm):  2.7 Centimeters (cm)    Additional Dimension in Centimeters (cm):  1.8 Centimeters (cm)   Tumor Extent:      Tumor Extension:  Tumor is confined to pancreas    Accessory Findings:      Treatment Effect:  No known presurgical therapy      Lymphovascular Invasion:  Not identified     Perineural Invasion:  Not identified   MARGINS   Margins:      Proximal Pancreatic Parenchymal Margin:  Uninvolved by invasive carcinoma and pancreatic high-grade intraepithelial neoplasia      Distance of Invasive Carcinoma from Margin:  0.6 Centimeters (cm)   :      Other Margin:  Splenic margin.      Margin Status:  Uninvolved by invasive carcinoma   LYMPH NODES  Number of Lymph Nodes Involved:  2   Number of Lymph Nodes Examined:  16   PATHOLOGIC STAGE CLASSIFICATION (pTNM, AJCC 8th Edition)  TNM Descriptors:  Not applicable   Primary Tumor (pT):  pT2   Regional Lymph Nodes (pN):  pN1   ADDITIONAL FINDINGS  Additional Pathologic Findings:  Pancreatic intraepithelial neoplasia    Highest Grade (PanIN):  High grade PanIN is present.   Additional Pathologic Findings:  Benign unilocular pancreatic cyst (1.3 cm in greatest dimension) at the pancreatic tail.     REVIEW OF SYSTEMS:   Constitutional: ( - ) fevers, ( - )  chills , ( - ) night sweats Eyes: ( - ) blurriness of vision, ( - ) double vision, ( - ) watery eyes Ears, nose, mouth, throat, and face: ( - ) mucositis, ( - ) sore throat Respiratory: ( - ) cough, ( - ) dyspnea, ( - ) wheezes Cardiovascular: ( - ) palpitation, ( - ) chest discomfort, ( - ) lower extremity swelling Gastrointestinal:  ( - ) nausea, ( - ) heartburn, ( - ) change in  bowel habits Skin: ( - ) abnormal skin rashes Lymphatics: ( - ) new lymphadenopathy, ( - ) easy bruising Neurological: ( - ) numbness, ( - ) tingling, ( - ) new weaknesses Behavioral/Psych: ( - ) mood change, ( - ) new changes  All other systems were reviewed with the patient and are negative.  I have reviewed the past medical history, past surgical history, social history and family history with the patient and they are unchanged from previous note.  ALLERGIES:  is allergic to demerol  [meperidine hcl].  MEDICATIONS:  Current Outpatient Medications  Medication Sig Dispense Refill  . aspirin 81 MG tablet Take 1 tablet (81 mg total) by mouth daily. 30 tablet 3  . atorvastatin (LIPITOR) 80 MG tablet Take 1 tablet (80 mg total) by mouth daily at 6 PM. (Patient taking differently: Take 80 mg by mouth daily. ) 30 tablet 2  . Continuous Blood Gluc Sensor (FREESTYLE LIBRE 14 DAY SENSOR) MISC 1 Device by Does not apply route every 14 (fourteen) days. 2 each 2  . ECHINACEA EXTRACT PO Take 2 tablets by mouth daily.     Marland Kitchen enoxaparin (LOVENOX) 40 MG/0.4ML injection Inject into the skin.    Marland Kitchen gabapentin (NEURONTIN) 300 MG capsule Take 1 capsule (300 mg total) by mouth 2 (two) times daily. 60 capsule 3  . glipiZIDE (GLUCOTROL) 10 MG tablet Take 10 mg by mouth See admin instructions. Take 10 mg in the morning, may take an additional 10 mg in the evening as needed for blood sugar over 240    . insulin degludec (TRESIBA FLEXTOUCH) 100 UNIT/ML SOPN FlexTouch Pen Inject 0.2 mLs (20 Units total) into the skin daily. 5 pen 2  . Insulin Glargine (BASAGLAR KWIKPEN) 100 UNIT/ML SOPN Inject into the skin.    . Insulin Pen Needle 32G X 4 MM MISC 1 Device by Does not apply route daily. 50 each 2  . NP THYROID 30 MG tablet Take 30 mg by mouth daily before breakfast.   2  . pantoprazole (PROTONIX) 20 MG tablet Take by mouth.    . sertraline (ZOLOFT) 50 MG tablet Take 50 mg by mouth every morning.     . tamsulosin (FLOMAX) 0.4 MG CAPS capsule Take by mouth.    . ticagrelor (BRILINTA) 90 MG TABS tablet Take 90 mg by mouth 2 (two) times daily.    . traMADol (ULTRAM) 50 MG tablet Take 1 tablet (50 mg total) by mouth every 8 (eight) hours as needed for up to 30 days. 90 tablet 2   No current facility-administered medications for this visit.     PHYSICAL EXAMINATION: ECOG PERFORMANCE STATUS: 2 - Symptomatic, <50% confined to bed  Today's Vitals   12/11/18 1036 12/11/18 1042  BP: 100/62   Pulse: 86    Resp: 16   Temp: 98.7 F (37.1 C)   TempSrc: Oral   SpO2: 99%   Weight: 150 lb 9.6 oz (68.3 kg)   Height: '5\' 10"'$  (1.778 m)   PainSc:  6    Body mass index is 21.61 kg/m.  Filed Weights   12/11/18 1036  Weight: 150 lb 9.6 oz (68.3 kg)    GENERAL: alert, no distress and slightly uncomfortable due to abdominal pain, slightly frail appearing  SKIN: lower edge of the incision wound with slight dehiscence, scant serosanguinous drainage, no pus   EYES: conjunctiva are pink and non-injected, sclera clear OROPHARYNX: no exudate, no erythema; lips, buccal mucosa, and tongue normal  NECK: supple, non-tender  LUNGS: clear to auscultation with normal breathing effort HEART: regular rate & rhythm and no murmurs and no lower extremity edema ABDOMEN: soft, non-distended, normal bowel sounds Musculoskeletal: no cyanosis of digits and no clubbing  PSYCH: alert & oriented x 3, fluent speech NEURO: no focal motor/sensory deficits  LABORATORY DATA:  I have reviewed the data as listed    Component Value Date/Time   NA 138 12/11/2018 1018   K 4.4 12/11/2018 1018   CL 101 12/11/2018 1018   CO2 26 12/11/2018 1018   GLUCOSE 377 (H) 12/11/2018 1018   BUN 32 (H) 12/11/2018 1018   CREATININE 0.88 12/11/2018 1018   CALCIUM 9.4 12/11/2018 1018   PROT 7.4 12/11/2018 1018   ALBUMIN 3.9 12/11/2018 1018   AST 16 12/11/2018 1018   ALT 19 12/11/2018 1018   ALKPHOS 74 12/11/2018 1018   BILITOT 0.8 12/11/2018 1018   GFRNONAA >60 12/11/2018 1018   GFRAA >60 12/11/2018 1018    No results found for: SPEP, UPEP  Lab Results  Component Value Date   WBC 5.2 12/11/2018   NEUTROABS 2.2 12/11/2018   HGB 14.9 12/11/2018   HCT 43.1 12/11/2018   MCV 92.9 12/11/2018   PLT 335 12/11/2018      Chemistry      Component Value Date/Time   NA 138 12/11/2018 1018   K 4.4 12/11/2018 1018   CL 101 12/11/2018 1018   CO2 26 12/11/2018 1018   BUN 32 (H) 12/11/2018 1018   CREATININE 0.88 12/11/2018 1018       Component Value Date/Time   CALCIUM 9.4 12/11/2018 1018   ALKPHOS 74 12/11/2018 1018   AST 16 12/11/2018 1018   ALT 19 12/11/2018 1018   BILITOT 0.8 12/11/2018 1018       RADIOGRAPHIC STUDIES: I have personally reviewed the radiological images as listed below and agreed with the findings in the report. Dg Chest 2 View  Result Date: 11/11/2018 CLINICAL DATA:  Weakness. EXAM: CHEST - 2 VIEW COMPARISON:  None. FINDINGS: The heart size and mediastinal contours are within normal limits. Both lungs are clear. The visualized skeletal structures are unremarkable. IMPRESSION: No active cardiopulmonary disease. Electronically Signed   By: Marijo Conception, M.D.   On: 11/11/2018 21:21   Ct Head Wo Contrast  Result Date: 11/11/2018 CLINICAL DATA:  Altered level of consciousness, lethargy EXAM: CT HEAD WITHOUT CONTRAST TECHNIQUE: Contiguous axial images were obtained from the base of the skull through the vertex without intravenous contrast. COMPARISON:  CT brain 06/03/2018 FINDINGS: Brain: No acute territorial infarction, hemorrhage or intracranial mass. 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 Mild small vessel ischemic changes of the white matter. Mild atrophy. Stable ventricle size Vascular: No hyperdense vessels.  Carotid vascular calcification Skull: Normal. Negative for fracture or focal lesion. Sinuses/Orbits: Mucosal thickening in the ethmoid sinuses Other: None IMPRESSION: 1. No CT evidence for acute intracranial abnormality. 2. Atrophy and chronic infarct in the left posterior frontal lobe. Electronically Signed   By: Donavan Foil M.D.   On: 11/11/2018 21:02   Mr Brain Wo Contrast  Result Date: 11/12/2018 CLINICAL DATA:  Altered level of consciousness. EXAM: MRI HEAD WITHOUT CONTRAST TECHNIQUE: Multiplanar, multiecho pulse sequences of the brain and surrounding structures were obtained without intravenous contrast. COMPARISON:  MRI head 01/20/2018, CT  11/11/2018 FINDINGS: Brain: Negative for acute infarct. Chronic infarct left parietal lobe. Minimal chronic changes in the white matter. Negative for hemorrhage or mass. Ventricle size normal. Vascular: Chronic  occlusion left internal carotid artery unchanged. Otherwise normal flow voids at the skull base. Skull and upper cervical spine: Negative Sinuses/Orbits: Mild mucosal edema paranasal sinuses.  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 Electronically Signed   By: Franchot Gallo M.D.   On: 11/12/2018 09:08

## 2018-12-06 ENCOUNTER — Telehealth: Payer: Self-pay | Admitting: *Deleted

## 2018-12-06 NOTE — Telephone Encounter (Signed)
Received vm message from patient inquiring about future  appt with Dr. Maylon Peppers.  TCT patient and informed him that his next appt is on 12/11/18 @ 10:30 for labs and 11am with Dr. Maylon Peppers. Ptvoiced understanding. Reminded him that no other people can come in with him @ the cancer center d/t Covid 19 visitor restrictions. Pt voiced understanding amd had no problem with that.

## 2018-12-11 ENCOUNTER — Other Ambulatory Visit: Payer: Self-pay

## 2018-12-11 ENCOUNTER — Telehealth: Payer: Self-pay | Admitting: Hematology

## 2018-12-11 ENCOUNTER — Inpatient Hospital Stay (HOSPITAL_BASED_OUTPATIENT_CLINIC_OR_DEPARTMENT_OTHER): Payer: PRIVATE HEALTH INSURANCE | Admitting: Hematology

## 2018-12-11 ENCOUNTER — Encounter: Payer: Self-pay | Admitting: Hematology

## 2018-12-11 ENCOUNTER — Inpatient Hospital Stay: Payer: PRIVATE HEALTH INSURANCE | Attending: Hematology and Oncology

## 2018-12-11 VITALS — BP 100/62 | HR 86 | Temp 98.7°F | Resp 16 | Ht 70.0 in | Wt 150.6 lb

## 2018-12-11 DIAGNOSIS — Z79899 Other long term (current) drug therapy: Secondary | ICD-10-CM

## 2018-12-11 DIAGNOSIS — C251 Malignant neoplasm of body of pancreas: Secondary | ICD-10-CM | POA: Insufficient documentation

## 2018-12-11 DIAGNOSIS — G8918 Other acute postprocedural pain: Secondary | ICD-10-CM

## 2018-12-11 DIAGNOSIS — E1165 Type 2 diabetes mellitus with hyperglycemia: Secondary | ICD-10-CM | POA: Insufficient documentation

## 2018-12-11 DIAGNOSIS — C259 Malignant neoplasm of pancreas, unspecified: Secondary | ICD-10-CM

## 2018-12-11 DIAGNOSIS — Z7982 Long term (current) use of aspirin: Secondary | ICD-10-CM | POA: Insufficient documentation

## 2018-12-11 DIAGNOSIS — T8130XA Disruption of wound, unspecified, initial encounter: Secondary | ICD-10-CM | POA: Diagnosis not present

## 2018-12-11 DIAGNOSIS — Z794 Long term (current) use of insulin: Secondary | ICD-10-CM

## 2018-12-11 DIAGNOSIS — Z9081 Acquired absence of spleen: Secondary | ICD-10-CM

## 2018-12-11 DIAGNOSIS — Z90411 Acquired partial absence of pancreas: Secondary | ICD-10-CM

## 2018-12-11 LAB — CBC WITH DIFFERENTIAL (CANCER CENTER ONLY)
Abs Immature Granulocytes: 0.01 10*3/uL (ref 0.00–0.07)
Basophils Absolute: 0.1 10*3/uL (ref 0.0–0.1)
Basophils Relative: 1 %
Eosinophils Absolute: 0.4 10*3/uL (ref 0.0–0.5)
Eosinophils Relative: 8 %
HCT: 43.1 % (ref 39.0–52.0)
Hemoglobin: 14.9 g/dL (ref 13.0–17.0)
Immature Granulocytes: 0 %
Lymphocytes Relative: 42 %
Lymphs Abs: 2.2 10*3/uL (ref 0.7–4.0)
MCH: 32.1 pg (ref 26.0–34.0)
MCHC: 34.6 g/dL (ref 30.0–36.0)
MCV: 92.9 fL (ref 80.0–100.0)
Monocytes Absolute: 0.3 10*3/uL (ref 0.1–1.0)
Monocytes Relative: 6 %
Neutro Abs: 2.2 10*3/uL (ref 1.7–7.7)
Neutrophils Relative %: 43 %
Platelet Count: 335 10*3/uL (ref 150–400)
RBC: 4.64 MIL/uL (ref 4.22–5.81)
RDW: 14 % (ref 11.5–15.5)
WBC Count: 5.2 10*3/uL (ref 4.0–10.5)
nRBC: 0 % (ref 0.0–0.2)

## 2018-12-11 LAB — CMP (CANCER CENTER ONLY)
ALT: 19 U/L (ref 0–44)
AST: 16 U/L (ref 15–41)
Albumin: 3.9 g/dL (ref 3.5–5.0)
Alkaline Phosphatase: 74 U/L (ref 38–126)
Anion gap: 11 (ref 5–15)
BUN: 32 mg/dL — ABNORMAL HIGH (ref 8–23)
CO2: 26 mmol/L (ref 22–32)
Calcium: 9.4 mg/dL (ref 8.9–10.3)
Chloride: 101 mmol/L (ref 98–111)
Creatinine: 0.88 mg/dL (ref 0.61–1.24)
GFR, Est AFR Am: >60 mL/min (ref >60)
GFR, Estimated: >60 mL/min (ref >60)
Glucose, Bld: 377 mg/dL — ABNORMAL HIGH (ref 70–99)
Potassium: 4.4 mmol/L (ref 3.5–5.1)
Sodium: 138 mmol/L (ref 135–145)
Total Bilirubin: 0.8 mg/dL (ref 0.3–1.2)
Total Protein: 7.4 g/dL (ref 6.5–8.1)

## 2018-12-11 MED ORDER — TRAMADOL HCL 50 MG PO TABS: 50.0000 mg | ORAL_TABLET | Freq: Three times a day (TID) | ORAL | 2 refills | Status: AC | PRN

## 2018-12-11 NOTE — Telephone Encounter (Signed)
Per 4/8 los Return to be determined, pending appt with Dr. Tera Helper

## 2018-12-12 LAB — CANCER ANTIGEN 19-9: CA 19-9: 20 U/mL (ref 0–35)

## 2018-12-17 ENCOUNTER — Other Ambulatory Visit: Payer: Self-pay

## 2018-12-18 ENCOUNTER — Inpatient Hospital Stay (HOSPITAL_COMMUNITY): Payer: PRIVATE HEALTH INSURANCE | Attending: Hematology | Admitting: Hematology

## 2018-12-18 ENCOUNTER — Other Ambulatory Visit: Payer: Self-pay

## 2018-12-18 ENCOUNTER — Encounter (HOSPITAL_COMMUNITY): Payer: Self-pay | Admitting: *Deleted

## 2018-12-18 ENCOUNTER — Other Ambulatory Visit (HOSPITAL_COMMUNITY): Payer: Self-pay | Admitting: *Deleted

## 2018-12-18 ENCOUNTER — Encounter (HOSPITAL_COMMUNITY): Payer: Self-pay | Admitting: Hematology

## 2018-12-18 VITALS — BP 132/71 | HR 65 | Temp 97.7°F | Resp 18 | Wt 154.8 lb

## 2018-12-18 DIAGNOSIS — E039 Hypothyroidism, unspecified: Secondary | ICD-10-CM

## 2018-12-18 DIAGNOSIS — Z8673 Personal history of transient ischemic attack (TIA), and cerebral infarction without residual deficits: Secondary | ICD-10-CM | POA: Diagnosis not present

## 2018-12-18 DIAGNOSIS — Z79899 Other long term (current) drug therapy: Secondary | ICD-10-CM

## 2018-12-18 DIAGNOSIS — Z7982 Long term (current) use of aspirin: Secondary | ICD-10-CM | POA: Diagnosis not present

## 2018-12-18 DIAGNOSIS — Z800 Family history of malignant neoplasm of digestive organs: Secondary | ICD-10-CM | POA: Insufficient documentation

## 2018-12-18 DIAGNOSIS — C251 Malignant neoplasm of body of pancreas: Secondary | ICD-10-CM

## 2018-12-18 DIAGNOSIS — Z5111 Encounter for antineoplastic chemotherapy: Secondary | ICD-10-CM | POA: Diagnosis present

## 2018-12-18 DIAGNOSIS — E119 Type 2 diabetes mellitus without complications: Secondary | ICD-10-CM

## 2018-12-18 DIAGNOSIS — C259 Malignant neoplasm of pancreas, unspecified: Secondary | ICD-10-CM

## 2018-12-18 DIAGNOSIS — Z90411 Acquired partial absence of pancreas: Secondary | ICD-10-CM

## 2018-12-18 DIAGNOSIS — Z7689 Persons encountering health services in other specified circumstances: Secondary | ICD-10-CM | POA: Insufficient documentation

## 2018-12-18 DIAGNOSIS — Z9081 Acquired absence of spleen: Secondary | ICD-10-CM | POA: Diagnosis not present

## 2018-12-18 DIAGNOSIS — Z794 Long term (current) use of insulin: Secondary | ICD-10-CM

## 2018-12-18 NOTE — Progress Notes (Signed)
START ON PATHWAY REGIMEN - Pancreatic Adenocarcinoma     A cycle is every 14 days:     Oxaliplatin      Leucovorin      Irinotecan      5-Fluorouracil   **Always confirm dose/schedule in your pharmacy ordering system**  Patient Characteristics: No Distant Metastases, Resectable, Adjuvant, Negative Margins (Includes Positive Lymph Nodes) Current evidence of distant metastases<= No AJCC T Category: T2 AJCC N Category: N1 AJCC M Category: M0 AJCC 8 Stage Grouping: IIB Treatment Setting: Adjuvant Intent of Therapy: Curative Intent, Discussed with Patient

## 2018-12-18 NOTE — Progress Notes (Signed)
I called and spoke with patient and advised him per Dr. Constance Haw office he needs to hold his Brilenta dose starting tonight and they are going to reschedule his port placement from Friday to Monday.  Patient verbalizes understanding.    Patient also reports that he sees Dr. Dorris Fetch here in town. I have relayed this to Dr. Delton Coombes and patient needs an appointment to be seen by Dr. Dorris Fetch.  I will get our schedulers to get him scheduled.

## 2018-12-18 NOTE — Patient Instructions (Addendum)
[place] at [facility] Discharge Instructions  You were seen today by Dr. Delton Coombes. He went over your recent scan results. He will get you scheduled for CT scans and a port placement. He will see you back in 2 weeks for labs, treatment and follow up.   Thank you for choosing [place] at [facility] to provide your oncology and hematology care.  To afford each patient quality time with our provider, please arrive at least 15 minutes before your scheduled appointment time.   If you have a lab appointment with the [place] please come in thru the  Main Entrance and check in at the main information desk  You need to re-schedule your appointment should you arrive 10 or more minutes late.  We strive to give you quality time with our providers, and arriving late affects you and other patients whose appointments are after yours.  Also, if you no show three or more times for appointments you may be dismissed from the clinic at the providers discretion.     Again, thank you for choosing [facility].  Our hope is that these requests will decrease the amount of time that you wait before being seen by our physicians.       _____________________________________________________________  Should you have questions after your visit to [facility], please contact our office at [phone] between the hours of 8:00 a.m. and 4:30 p.m.  Voicemails left after 4:00 p.m. will not be returned until the following business day.  For prescription refill requests, have your pharmacy contact our office and allow 72 hours.    Cancer Center Support Programs:   > Cancer Support Group  2nd Tuesday of the month 1pm-2pm, Journey Room

## 2018-12-18 NOTE — Progress Notes (Signed)
[facility] NOTE  Patient Care Team: Doree Albee, MD as PCP - General (Internal Medicine)  CHIEF COMPLAINTS/PURPOSE OF CONSULTATION:  Pancreatic cancer  HISTORY OF PRESENTING ILLNESS:  Mark Foster 63 y.o. male is seen in consultation today for adjuvant chemotherapy for pancreatic cancer.  He lives in [place] by himself at home.  He moved here from [place] 2 years ago.  He was evaluated for leukopenia and thrombocytopenia.  A CT scan of the abdomen showed incidental finding of pancreatic mass.  A PET CT scan in February was done which did not show metastatic disease.  He was seen by Dr. Zenia Resides at [facility] and underwent distal pancreatectomy and splenectomy.  He reports weight loss of about 11 pounds prior to surgery.  Since the surgery his appetite is back to baseline.  He denies any diarrhea.  No nausea or vomiting.  Denies any tingling or numbness in the extremities.  However he has some balance issues and walks with a cane.  His past history significant for diabetes and a TIA a few years ago.  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  Family history significant for father with pancreatic cancer, died at age 79.Denies any new pains. Had not noticed any recent bleeding such as epistaxis, hematuria or hematochezia. Denies recent chest pain on exertion, shortness of breath on minimal exertion, pre-syncopal episodes, or palpitations. Denies any numbness or tingling in hands or feet. Denies any recent fevers, infections, or recent hospitalizations. Patient reports appetite at 75% and energy level at 50%.   MEDICAL HISTORY:  Past Medical History:  Diagnosis Date  . Cervical compression fracture ([place])   . Cervical spinal stenosis   . Diabetic neuropathy (HCC)    Bilateral legs  . Diverticulitis   . Family history of pancreatic cancer   . History of kidney stones   . Hypothyroidism   . Pancreatic cancer ([place])   . Stenosis of left  vertebral artery   . Stroke [facility])    2011  . Type 2 diabetes mellitus ([place])     SURGICAL HISTORY: Past Surgical History:  Procedure Laterality Date  . APPENDECTOMY    . COLON RESECTION     For diverticulitis  . COLON SURGERY    . ESOPHAGOGASTRODUODENOSCOPY N/A 10/03/2018   Procedure: ESOPHAGOGASTRODUODENOSCOPY (EGD);  Surgeon: [name], MD;  Location: [name];  Service: Endoscopy;  Laterality: N/A;  . EUS N/A 10/03/2018   Procedure: UPPER ENDOSCOPIC ULTRASOUND (EUS) RADIAL;  Surgeon: [name], MD;  Location: [facility];  Service: Endoscopy;  Laterality: N/A;  . FINE NEEDLE ASPIRATION N/A 10/03/2018   Procedure: FINE NEEDLE ASPIRATION (FNA) LINEAR;  Surgeon: [name], MD;  Location: [facility];  Service: Endoscopy;  Laterality: N/A;  . IR ANGIO INTRA EXTRACRAN SEL COM CAROTID INNOMINATE BILAT MOD SED  01/21/2018  . IR ANGIO INTRA EXTRACRAN SEL COM CAROTID INNOMINATE UNI R MOD SED  03/21/2018  . IR ANGIO VERTEBRAL SEL VERTEBRAL BILAT MOD SED  01/21/2018  . IR TRANSCATH EXCRAN VERT OR CAR A STENT  03/21/2018  . RADIOLOGY WITH ANESTHESIA N/A 03/21/2018   Procedure: IR WITH ANESTHESIA WITH STENT PLACEMENT;  Surgeon: Luanne Bras, MD;  Location: [facility];  Service: Radiology;  Laterality: N/A;  . ROTATOR CUFF REPAIR     Left    SOCIAL HISTORY: Social History   Socioeconomic History  . Marital status: Single    Spouse name: Not on file  . Number of  children: 2  . Years of education: Not on file  . Highest education level: Not on file  Occupational History  . Occupation: Estate agent  Social Needs  . Financial resource strain: Somewhat hard  . Food insecurity:    Worry: Never true    Inability: Never true  . Transportation needs:    Medical: No    Non-medical: No  Tobacco Use  . Smoking status: Never Smoker  . Smokeless tobacco: Never Used  Substance and Sexual Activity  . Alcohol use: Not Currently  . Drug use: Never  . Sexual activity:  Not on file  Lifestyle  . Physical activity:    Days per week: 0 days    Minutes per session: 0 min  . Stress: To some extent  Relationships  . Social connections:    Talks on phone: More than three times a week    Gets together: Once a week    Attends religious service: More than 4 times per year    Active member of club or organization: No    Attends meetings of clubs or organizations: Never    Relationship status: Divorced  . Intimate partner violence:    Fear of current or ex partner: No    Emotionally abused: No    Physically abused: No    Forced sexual activity: No  Other Topics Concern  . Not on file  Social History Narrative  . Not on file    FAMILY HISTORY: Family History  Problem Relation Age of Onset  . Stroke Mother   . Pancreatic cancer Father 41       d. 80  . Stroke Maternal Grandmother   . Heart attack Maternal Grandfather   . Heart Problems Paternal Grandfather   . Scoliosis Daughter   . Muscular dystrophy Grandson     ALLERGIES:  is allergic to demerol [meperidine hcl].  MEDICATIONS:  Current Outpatient Medications  Medication Sig Dispense Refill  . aspirin 81 MG tablet Take 1 tablet (81 mg total) by mouth daily. 30 tablet 3  . atorvastatin (LIPITOR) 80 MG tablet Take 1 tablet (80 mg total) by mouth daily at 6 PM. (Patient taking differently: Take 80 mg by mouth daily. ) 30 tablet 2  . Continuous Blood Gluc Sensor (FREESTYLE LIBRE 14 DAY SENSOR) MISC 1 Device by Does not apply route every 14 (fourteen) days. 2 each 2  . ECHINACEA EXTRACT PO Take 2 tablets by mouth daily.     Marland Kitchen gabapentin (NEURONTIN) 300 MG capsule Take 1 capsule (300 mg total) by mouth 2 (two) times daily. 60 capsule 3  . glipiZIDE (GLUCOTROL) 10 MG tablet Take 10 mg by mouth See admin instructions. Take 10 mg in the morning, may take an additional 10 mg in the evening as needed for blood sugar over 240    . insulin degludec (TRESIBA FLEXTOUCH) 100 UNIT/ML SOPN FlexTouch Pen Inject  0.2 mLs (20 Units total) into the skin daily. 5 pen 2  . Insulin Glargine (BASAGLAR KWIKPEN) 100 UNIT/ML SOPN Inject into the skin.    . Insulin Pen Needle 32G X 4 MM MISC 1 Device by Does not apply route daily. 50 each 2  . NP THYROID 30 MG tablet Take 30 mg by mouth daily before breakfast.   2  . pantoprazole (PROTONIX) 20 MG tablet Take by mouth.    . sertraline (ZOLOFT) 50 MG tablet Take 50 mg by mouth every morning.     . tamsulosin (FLOMAX) 0.4 MG CAPS  capsule Take by mouth.    . ticagrelor (BRILINTA) 90 MG TABS tablet Take 90 mg by mouth 2 (two) times daily.    . traMADol (ULTRAM) 50 MG tablet Take 1 tablet (50 mg total) by mouth every 8 (eight) hours as needed for up to 30 days. 90 tablet 2   No current facility-administered medications for this visit.     REVIEW OF SYSTEMS:   Constitutional: Denies fevers, chills or abnormal night sweats Eyes: Denies blurriness of vision, double vision or watery eyes Ears, nose, mouth, throat, and face: Denies mucositis or sore throat Respiratory: Denies cough, dyspnea or wheezes Cardiovascular: Denies palpitation, chest discomfort or lower extremity swelling Gastrointestinal:  Denies nausea, heartburn or change in bowel habits Skin: Denies abnormal skin rashes Lymphatics: Denies new lymphadenopathy or easy bruising Neurological:Denies numbness, tingling or new weaknesses Behavioral/Psych: Mood is stable, no new changes  All other systems were reviewed with the patient and are negative.  PHYSICAL EXAMINATION: ECOG PERFORMANCE STATUS: 1 - Symptomatic but completely ambulatory  Vitals:   12/18/18 1341  BP: 132/71  Pulse: 65  Resp: 18  Temp: 97.7 F (36.5 C)  SpO2: 100%   Filed Weights   12/18/18 1341  Weight: 154 lb 12.8 oz (70.2 kg)    GENERAL:alert, no distress and comfortable SKIN: skin color, texture, turgor are normal, no rashes or significant lesions EYES: normal, conjunctiva are pink and non-injected, sclera  clear OROPHARYNX:no exudate, no erythema and lips, buccal mucosa, and tongue normal  NECK: supple, thyroid normal size, non-tender, without nodularity LYMPH:  no palpable lymphadenopathy in the cervical, axillary or inguinal LUNGS: clear to auscultation and percussion with normal breathing effort HEART: regular rate & rhythm and no murmurs and no lower extremity edema ABDOMEN:abdomen soft, non-tender and normal bowel sounds Musculoskeletal:no cyanosis of digits and no clubbing  PSYCH: alert & oriented x 3 with fluent speech NEURO: no focal motor/sensory deficits Abdominal wound at the suture line is healing well.  No gaping.  No discharge.  LABORATORY DATA:  I have reviewed the data as listed Lab Results  Component Value Date   WBC 5.2 12/11/2018   HGB 14.9 12/11/2018   HCT 43.1 12/11/2018   MCV 92.9 12/11/2018   PLT 335 12/11/2018     Chemistry      Component Value Date/Time   NA 138 12/11/2018 1018   K 4.4 12/11/2018 1018   CL 101 12/11/2018 1018   CO2 26 12/11/2018 1018   BUN 32 (H) 12/11/2018 1018   CREATININE 0.88 12/11/2018 1018      Component Value Date/Time   CALCIUM 9.4 12/11/2018 1018   ALKPHOS 74 12/11/2018 1018   AST 16 12/11/2018 1018   ALT 19 12/11/2018 1018   BILITOT 0.8 12/11/2018 1018       RADIOGRAPHIC STUDIES: I have personally reviewed the radiological images as listed and agreed with the findings in the report.  ASSESSMENT & PLAN:  Pancreatic adenocarcinoma (HCC) 1.  Stage IIb (T2N1) pancreatic adenocarcinoma: -Status post distal pancreatectomy and splenectomy at [facility] by Dr. Zenia Resides on 11/20/2018. 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  I have recommended 6 months of adjuvant chemotherapy. -We talked about the 2 common regimens including modified FOLFIRINOX and gemcitabine and capecitabine. -He is borderline for it at this time.  I have recommended to give a trial of modified FOLFIRINOX.   If he cannot tolerate it we can change him to a less intensive regimen with  gemcitabine plus capecitabine.  If he ends up with gemcitabine-based regimen, I will consider adding radiation with infusional 5-FU. -We talked about the side effects in detail.  He will need a port placement for administration of chemotherapy. - We will also obtain a baseline CT chest, abdomen and pelvis prior to start of therapy.  We will also obtain a baseline CA 19-9 level.  2.  Postsplenectomy state: -He did receive vaccinations prior to splenectomy. -We will follow-up on booster  schedule.  Orders Placed This Encounter  Procedures  . CT Abdomen Pelvis W Contrast    Standing Status:   Future    Standing Expiration Date:   12/18/2019    Order Specific Question:   ** REASON FOR EXAM (FREE TEXT)    Answer:   pancreatic cancer    Order Specific Question:   If indicated for the ordered procedure, I authorize the administration of contrast media per Radiology protocol    Answer:   Yes    Order Specific Question:   Preferred imaging location?    Answer:   [facility]    Order Specific Question:   Is Oral Contrast requested for this exam?    Answer:   Yes, Per Radiology protocol    Order Specific Question:   Radiology Contrast Protocol - do NOT remove file path    Answer:   \\[email or web address]  . CT Chest W Contrast    Standing Status:   Future    Standing Expiration Date:   12/18/2019    Order Specific Question:   ** REASON FOR EXAM (FREE TEXT)    Answer:   pancreatic cancer    Order Specific Question:   If indicated for the ordered procedure, I authorize the administration of contrast media per Radiology protocol    Answer:   Yes    Order Specific Question:   Preferred imaging location?    Answer:   [facility]    Order Specific Question:   Radiology Contrast Protocol - do NOT remove file path    Answer:   \\[email or web address]  . Cancer antigen  19-9    Standing Status:   Future    Standing Expiration Date:   12/18/2019    All questions were answered. The patient knows to call the clinic with any problems, questions or concerns.      Derek Jack, MD 12/18/2018 3:42 PM

## 2018-12-18 NOTE — Assessment & Plan Note (Addendum)
1.  Stage IIb (T2N1) pancreatic adenocarcinoma: -Status post distal pancreatectomy and splenectomy at [facility] by Dr. Zenia Resides on 11/20/2018. 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  I have recommended 6 months of adjuvant chemotherapy. -We talked about the 2 common regimens including modified FOLFIRINOX and gemcitabine and capecitabine. -He is borderline for it at this time.  I have recommended to give a trial of modified FOLFIRINOX.  If he cannot tolerate it we can change him to a less intensive regimen with gemcitabine plus capecitabine.  If he ends up with gemcitabine-based regimen, I will consider adding radiation with infusional 5-FU. -We talked about the side effects in detail.  He will need a port placement for administration of chemotherapy. - We will also obtain a baseline CT chest, abdomen and pelvis prior to start of therapy.  We will also obtain a baseline CA 19-9 level.  2.  Postsplenectomy state: -He did receive vaccinations prior to splenectomy. -We will follow-up on booster  schedule.  3.  Diabetes: -This seems to poorly controlled.  We will make a referral to Dr. Dorris Fetch.

## 2018-12-19 ENCOUNTER — Encounter (HOSPITAL_COMMUNITY)
Admission: RE | Admit: 2018-12-19 | Discharge: 2018-12-19 | Disposition: A | Discharge status: Home / Self Care | Payer: PRIVATE HEALTH INSURANCE | Source: Ambulatory Visit | Attending: General Surgery | Admitting: General Surgery

## 2018-12-19 ENCOUNTER — Encounter (HOSPITAL_COMMUNITY): Payer: Self-pay

## 2018-12-19 ENCOUNTER — Other Ambulatory Visit: Payer: Self-pay

## 2018-12-19 DIAGNOSIS — Z95828 Presence of other vascular implants and grafts: Secondary | ICD-10-CM | POA: Insufficient documentation

## 2018-12-19 MED ORDER — LIDOCAINE-PRILOCAINE 2.5-2.5 % EX CREA: TOPICAL_CREAM | CUTANEOUS | 3 refills | Status: DC

## 2018-12-19 MED ORDER — PROCHLORPERAZINE MALEATE 10 MG PO TABS: 10.0000 mg | ORAL_TABLET | Freq: Four times a day (QID) | ORAL | 1 refills | Status: DC | PRN

## 2018-12-19 MED ORDER — LOPERAMIDE HCL 2 MG PO TABS: 2.0000 mg | ORAL_TABLET | ORAL | 1 refills | Status: DC | PRN

## 2018-12-19 NOTE — Patient Instructions (Addendum)
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  You will be treated every 2 weeks with a regimen called FOLFIRINOX - the medications in this regimen are oxaliplatin, irinotecan, leucovorin, and fluorouracil (5FU). The intent of treatment is to cure your cancer. You will see the doctor regularly throughout treatment.  We monitor your lab work prior to every treatment. The doctor monitors your response to treatment by the way you are feeling, your blood work, and scans periodically.  There will be wait times while you are here for treatment.  It will take about 30 minutes to 1 hour for your lab work to result.  Then there will be wait times while pharmacy mixes your medications.   Medications you will receive in the clinic prior to chemotherapy:  Aloxi:  ALOXI is used in adults to help prevent the nausea and vomiting that happens with certain anti-cancer medicines (chemotherapy).  Aloxi is a long acting medication, and will remain in your system for 24-36 hours.   Emend:  This is an anti-nausea medication that is used with Aloxi to help prevent nausea and vomiting caused by chemotherapy.  Dexamethasone:  This is a steroid given prior to chemotherapy to help prevent allergic reactions; it may also help prevent and control nausea and diarrhea.    Oxaliplatin (Eloxatin)  About This Drug  Oxaliplatin is used to treat cancer. It is given in the vein (IV).  It takes two hours to infuse.  Possible Side Effects  . Bone marrow suppression. This is a decrease in the number of white blood cells, red blood cells, and platelets. This may raise your risk of infection, make you tired and weak (fatigue), and raise your risk of bleeding.  . Tiredness  . Soreness of the mouth and throat. You may have red areas, white patches, or sores that hurt.  . Nausea and vomiting (throwing up)  . Diarrhea (loose bowel movements)  . Changes in  your liver function  . Effects on the nerves called peripheral neuropathy. You may feel numbness, tingling, or pain in your hands and feet, and may be worse in cold temperatures. It may be hard for you to button your clothes, open jars, or walk as usual. The effect on the nerves may get worse with more doses of the drug. These effects get better in some people after the drug is stopped but it does not get better in all people  Note: Each of the side effects above was reported in 40% or greater of patients treated with oxaliplatin. Not all possible side effects are included above.  Warnings and Precautions  . Allergic reactions, including anaphylaxis, which may be life-threatening are rare but may happen in some patients. Signs of allergic reaction to this drug may be swelling of the face, feeling like your tongue or throat are swelling, trouble breathing, rash, itching, fever, chills, feeling dizzy, and/or feeling that your heart is beating in a fast or not normal way. If this happens, do not take another dose of this drug. You should get urgent medical treatment.  . Inflammation (swelling) of the lungs, which may be life-threatening. You may have a dry cough or trouble breathing.  . Effects on the nerves (neuropathy) may resolve within 14 days, or it may persist beyond 14 days.  . Severe decrease in white blood cells when combined with the chemotherapy agents 5-fluorouracil and leucovorin. This may be life-threatening.  . Severe changes in your  liver function  . Abnormal heart beat and/or EKG, which can be life-threatening  . Rhabdomyolysis- damage to your muscles which may release proteins in your blood and affect how your kidneys work, which can be life-threatening. You may have severe muscle weakness and/or pain, or dark urine.  Important Information  . This drug may impair your ability to drive or use machinery. Talk to your doctor and/or nurse about precautions you may  need to take.  . This drug may be present in the saliva, tears, sweat, urine, stool, vomit, semen, and vaginal secretions. Talk to your doctor and/or your nurse about the necessary precautions to take during this time.  . The effects on the nerves can be aggravated by exposure to cold. Avoid cold beverages, use of ice and make sure you cover your skin and dress warmly prior to being exposed to cold temperatures while you are receiving treatment with oxaliplatin.  Treating Side Effects  . Manage tiredness by pacing your activities for the day.  . Be sure to include periods of rest between energy-draining activities.  . To decrease the risk of infection, wash your hands regularly.  . Avoid close contact with people who have a cold, the flu, or other infections. . Take your temperature as your doctor or nurse tells you, and whenever you feel like you may have a fever.  . To help decrease the risk of bleeding, use a soft toothbrush. Check with your nurse before using dental floss.  . Be very careful when using knives or tools.  . Use an electric shaver instead of a razor.  . Drink plenty of fluids (a minimum of eight glasses per day is recommended).  . Mouth care is very important. Your mouth care should consist of routine, gentle cleaning of your teeth or dentures and rinsing your mouth with a mixture of 1/2 teaspoon of salt in 8 ounces of water or 1/2 teaspoon of baking soda in 8 ounces of water. This should be done at least after each meal and at bedtime.  . If you have mouth sores, avoid mouthwash that has alcohol. Also avoid alcohol and smoking because they can bother your mouth and throat.  . To help with nausea and vomiting, eat small, frequent meals instead of three large meals a day. Choose foods and drinks that are at room temperature. Ask your nurse or doctor about other helpful tips and medicine that is available to help stop or lessen these symptoms.  . If you  throw up or have loose bowel movements, you should drink more fluids so that you do not become dehydrated (lack of water in the body from losing too much fluid).  . If you have diarrhea, eat low-fiber foods that are high in protein and calories and avoid foods that can irritate your digestive tracts or lead to cramping.  . Ask your nurse or doctor about medicine that can lessen or stop your diarrhea.  . If you have numbness and tingling in your hands and feet, be careful when cooking, walking, and handling sharp objects and hot liquids.  . Do not drink cold drinks or use ice in beverages. Drink fluids at room temperature or warmer, and drink through a straw.  . Wear gloves to touch cold objects, and wear warm clothing and cover you skin during cold weather.  Food and Drug Interactions  . There are no known interactions of oxaliplatin with food and other medications.  . This drug may interact with other medicines.  Tell your doctor and pharmacist about all the prescription and over-the-counter medicines and dietary supplements (vitamins, minerals, herbs and others) that you are taking at this time. Also, check with your doctor or pharmacist before starting any new prescription or over-the-counter medicines, or dietary supplements to make sure that there are no interactions  When to Call the Doctor  Call your doctor or nurse if you have any of these symptoms and/or any new or unusual symptoms:  . Fever of 100.4 F (38 C) or higher  . Chills  . Tiredness that interferes with your daily activities  . Feeling dizzy or lightheaded  . Easy bleeding or bruising  . Feeling that your heart is beating in a fast or not normal way (palpitations)  . Pain in your chest  . Dry cough  . Trouble breathing  . Pain in your mouth or throat that makes it hard to eat or drink  . Nausea that stops you from eating or drinking and/or is not relieved by prescribed medicines  . Throwing up  more than 3 times a day  . Diarrhea, 4 times in one day or diarrhea with lack of strength or a feeling of being dizzy  . Numbness, tingling, or pain in your hands and feet  . Signs of possible liver problems: dark urine, pale bowel movements, bad stomach pain, feeling very tired and weak, unusual itching, or yellowing of the eyes or skin  . Signs of rhabdomyolysis: decreased urine, very dark urine, muscle pain in the shoulders, thighs, or lower back; muscle weakness or trouble moving arms and legs  . Signs of allergic reaction: swelling of the face, feeling like your tongue or throat are swelling, trouble breathing, rash, itching, fever, chills, feeling dizzy, and/or feeling that your heart is beating in a fast or not normal way. If this happens, call 911 for emergency care.  . If you think you may be pregnant  Reproduction Warnings  . Pregnancy warning: This drug may have harmful effects on the unborn baby. Women of childbearing potential should use effective methods of birth control during your cancer treatment. Let your doctor know right away if you think you may be pregnant or may have impregnated your partner.  . Breastfeeding warning: It is not known if this drug passes into breast milk. For this reason, women should talk to their doctor about the risks and benefits of breastfeeding during treatment with this drug because this drug may enter the breast milk and cause harm to a breastfeeding baby.  . Fertility warning: Human fertility studies have not been done with this drug. Talk with your doctor or nurse if you plan to have children. Ask for information on sperm or egg banking.   Leucovorin Calcium  About This Drug  Leucovorin is a vitamin. It is used in combination with other cancer fighting drugs such as 5-fluorouracil and methotrexate. Leucovorin is given in the vein (IV).  It is given to enhance the anti-cancer effects of fluorouracil (5FU). It will infuse along with  the oxaliplatin over 2 hours.   Possible Side Effects . Rash and itching  Note: Leucovorin by itself has very few side effects. Other side effects you may have can be caused by the other drugs you are taking, such as 5-fluorouracil.  Warnings and Precautions  . Allergic reactions, including anaphylaxis are rare but may happen in some patients. Signs of allergic reaction to this drug may be swelling of the face, feeling like your tongue or throat are  swelling, trouble breathing, rash, itching, fever, chills, feeling dizzy, and/or feeling that your heart is beating in a fast or not normal way. If this happens, do not take another dose of this drug. You should get urgent medical treatment.  Food and Drug Interactions  . There are no known interactions of leucovorin with food.  . This drug may interact with other medicines. Tell your doctor and pharmacist about all the prescription and over-the-counter medicines and dietary supplements (vitamins, minerals, herbs and others) that you are taking at this time.  . Also, check with your doctor or pharmacist before starting any new prescription or over-the-counter medicines, or dietary supplements to make sure that there are no interactions.  When to Call the Doctor  Call your doctor or nurse if you have any of these symptoms and/or any new or unusual symptoms:  . A new rash or a rash that is not relieved by prescribed medicines  . Signs of allergic reaction: swelling of the face, feeling like your tongue or throat are swelling, trouble  breathing, rash, itching, fever, chills, feeling dizzy, and/or feeling that your heart is beating in a fast or not normal way. If this happens, call 911 for emergency care.  . If you think you may be pregnant  Reproduction Warnings  . Pregnancy warning: It is not known if this drug may harm an unborn child. For this reason, be sure to talk with your doctor if you are pregnant or planning to become  pregnant while receiving this drug. Let your doctor know right away if you think you may be pregnant  . Breastfeeding warning: It is not known if this drug passes into breast milk. For this reason, women should talk to their doctor about the risks and benefits of breastfeeding during treatment with this drug because this drug may enter the breast milk and cause harm to a breastfeeding baby.  . Fertility warning: Human fertility studies have not been done with this drug. Talk with your doctor or nurse if you plan to have children. Ask for information on sperm or egg banking.    Irinotecan Hydrochloride (Generic Name) Other Names: Camptosar, camptothecin-11, CPT-11   About this drug Irinotecan hydrochloride is used to treat cancer. This drug is given in the vein (IV). It takes 1.5 hours to infuse)   Possible side effects (more common) . Loose bowel movements (diarrhea) that may last for a few days . Bone marrow depression. This is a decrease in the number of white blood cells, red blood cells, and platelets. This may raise your risk of infection, make you tired and weak (fatigue), and raise your risk of bleeding. . Nausea and throwing up (vomiting). These symptoms may happen within a few hours or many hours after your treatment and may last up to 24 hours. Medicines are available to stop or lessen these side effects. . Hair loss: Most often hair loss is temporary; your hair should grow back when treatment is done.   Possible side effects (less common) . Skin and tissue irritation. You may have redness, pain, warmth, or swelling at the IV site. . Weakness . Trouble Breathing . Decreased Appetite (decreased hunger)   Treating side effects . Drink 6-8 cups of fluids each day unless your doctor has told you to limit your fluid intake due to some other health problem. A cup is 8 ounces of fluid. If you throw up or have loose bowel movements, you should drink more fluids so that you do not  become dehydrated (lack water in the body from losing too much fluid). . Ask your doctor or nurse about medicine that is available to help stop or lessen the loose bowel movements. . Talk with your nurse about getting a wig before you lose your hair. Also, call the [place] at 800-ACS-2345 to find out information about the "Look Good, Feel Better" program close to where you live. It is a free program where women getting chemotherapy can learn about wigs, turbans and scarves as well as makeup techniques and skin and nail care. . Use effective methods of birth control during your cancer treatment. Talk with your doctor or nurse about options for birth control. . Vaginal lubricants can be used to lessen vaginal dryness, itching, and pain during sexual relations.   Food and drug interactions There are no known interactions of irinotecan hydrochloride with food. This drug may interact with other medicines. Tell your doctor and pharmacist about all the medicines and dietary supplements (vitamins, minerals, herbs and others) that you are taking at this time. The safety and use of dietary supplements and alternative diets are often not known. Using these might affect your cancer or interfere with your treatment. Until more is known, you should not use dietary supplements or alternative diets without your cancer doctor's help.   When to call the doctor Call your doctor or nurse right away if you have any of these symptoms: . Loose bowel movements (diarrhea) more than 5 or 6 times a day or loose bowel movements with weakness or feeling lightheaded . Temperature of 100.4 F (38 C) or above . Chills . Trouble breathing or feeling short of breath . Easy bruising or bleeding . Nausea that stops you from eating or drinking . Throwing up more than three times in one day . Redness, pain, warmth, or swelling at the IV site . Feeling dizzy, lightheaded, or if you pass out   Call your doctor or nurse  as soon as possible if you have any of these symptoms: . Nausea that is not relieved by prescribed medicines . Extreme tiredness that interferes with normal activities   Sexual problems and reproduction concerns . Infertility warning: Sexual problems and reproduction concerns may happen. In both men and women, this drug may affect your ability to have children. This cannot be determined before your treatment. Talk with your doctor or nurse if you plan to have children. Ask for information on sperm or egg banking. . In men, this drug may interfere with your ability to make sperm, but it should not change your ability to have sexual relations. . In women, menstrual bleeding may become irregular or stop while you are getting this drug. Do not assume that you cannot become pregnant if you do not have a menstrual period. . Women may go through signs of menopause (change of life) like vaginal dryness or itching. Vaginal lubricants can be used to lessen vaginal dryness, itching, and pain during sexual relations. . Genetic counseling is available for you to talk about the effects of this drug therapy on future pregnancies. Also, a genetic counselor can look at the possible risk of problems in the unborn baby due to this medicine if an exposure happens during pregnancy. . Pregnancy warning: This drug may have harmful effects on the unborn child, so effective methods of birth control should be used during your cancer treatment. . Breast feeding warning: Women should not breast feed during treatment because this drug could enter the breast milk  and badly harm a breast feeding baby.     5-Fluorouracil (Adrucil; 5FU)  About This Drug  Fluorouracil is used to treat cancer. It is given in the vein (IV). It is given as an IV push from a syringe and also as a continuous infusion given via an ambulatory pump (a pump you take home and wear for a specified amount of time).  Possible Side Effects  . Bone marrow  suppression. This is a decrease in the number of white blood cells, red blood cells, and platelets. This may raise your risk of infection, make you tired and weak (fatigue), and raise your risk of bleeding  . Changes in the tissue of the heart and/or heart attack. Some changes may happen that can cause your heart to have less ability to pump blood.  . Blurred vision or other changes in eyesight  . Nausea and throwing up (vomiting)  . Diarrhea (loose bowel movements)  . Ulcers - sores that may cause pain or bleeding in your digestive tract, which includes your mouth, esophagus, stomach, small/large intestines and rectum  . Soreness of the mouth and throat. You may have red areas, white patches, or sores that hurt.  . Allergic reactions, including anaphylaxis are rare but may happen in some patients. Signs of allergic reaction to this drug may be swelling of the face, feeling like your tongue or throat are swelling, trouble breathing, rash, itching, fever, chills, feeling dizzy, and/or feeling that your heart is beating in a fast or not normal way. If this happens, do not take another dose of this drug. You should get urgent medical treatment.  . Sensitivity to light (photosensitivity). Photosensitivity means that you may become more sensitive to the sun and/or light. You may get a skin rash/reaction if you are in the sun or are exposed to sun lamps and tanning beds. Your eyes may water more, mostly in bright light.  . Changes in your nail color, nail loss and/or brittle nail  . Darkening of the skin, or changes to the color of your skin and/or veins used for infusion  . Rash, dry skin, or itching  Note: Not all possible side effects are included above.  Warnings and Precautions  . Hand-and-foot syndrome. The palms of your hands or soles of your feet may tingle, become numb, painful, swollen, or red.  . Changes in your central nervous system can happen. The central nervous  system is made up of your brain and spinal cord. You could feel extreme tiredness, agitation, confusion, hallucinations (see or hear things that are not there), trouble understanding or speaking, loss of control of your bowels or bladder, eyesight changes, numbness or lack of strength to your arms, legs, face, or body, or coma. If you start to have any of these symptoms let your doctor know right away.  . Side effects of this drug may be unexpectedly severe in some patients  Note: Some of the side effects above are very rare. If you have concerns and/or questions, please discuss them with your medical team.  Important Information  . This drug may be present in the saliva, tears, sweat, urine, stool, vomit, semen, and vaginal secretions. Talk to your doctor and/or your nurse about the necessary precautions to take during this time.  Treating Side Effects  . Manage tiredness by pacing your activities for the day.  . Be sure to include periods of rest between energy-draining activities.  . To help decrease the risk of infections, wash your hands  regularly.  . Avoid close contact with people who have a cold, the flu, or other infections.  . Take your temperature as your doctor or nurse tells you, and whenever you feel like you may have a fever.  . Use a soft toothbrush. Check with your nurse before using dental floss.  . Be very careful when using knives or tools.  . Use an electric shaver instead of a razor.  . If you have a nose bleed, sit with your head tipped slightly forward. Apply pressure by lightly pinching the bridge of your nose between your thumb and forefinger. Call your doctor if you feel dizzy or faint or if the bleeding doesn't stop after 10 to 15 minutes.  . Drink plenty of fluids (a minimum of eight glasses per day is recommended).  . If you throw up or have loose bowel movements, you should drink more fluids so that you do not  become dehydrated (lack of  water in the body from losing too much fluid).  . To help with nausea and vomiting, eat small, frequent meals instead of three large meals a day. Choose foods and drinks that are at room temperature. Ask your nurse or doctor about other helpful tips and medicine that is available to help, stop, or lessen these symptoms.  . If you have diarrhea, eat low-fiber foods that are high in protein and calories and avoid foods that can irritate your digestive tracts or lead to cramping.  . Ask your nurse or doctor about medicine that can lessen or stop your diarrhea.  . Mouth care is very important. Your mouth care should consist of routine, gentle cleaning of your teeth or dentures and rinsing your mouth with a mixture of 1/2 teaspoon of salt in 8 ounces of water or 1/2 teaspoon of baking soda in 8 ounces of water. This should be done at least after each meal and at bedtime.  . If you have mouth sores, avoid mouthwash that has alcohol. Also avoid alcohol and smoking because they can bother your mouth and throat.  Marland Kitchen Keeping your nails moisturized may help with brittleness.  . To help with itching, moisturize your skin several times day.  . Use sunscreen with SPF 30 or higher when you are outdoors even for a short time. Cover up when you are out in the sun. Wear wide-brimmed hats, long-sleeved shirts, and pants. Keep your neck, chest, and back covered. Wear dark sun glasses when in the sun or bright lights.  . If you get a rash do not put anything on it unless your doctor or nurse says you may. Keep the area around the rash clean and dry. Ask your doctor for medicine if your rash bothers you.  Marland Kitchen Keeping your pain under control is important to your well-being. Please tell your doctor or nurse if you are experiencing pain.  Food and Drug Interactions  . There are no known interactions of fluorouracil with food.  . Check with your doctor or pharmacist about all other prescription medicines and  over-the-counter medicines and dietary supplements (vitamins, minerals, herbs and others) you are taking before starting this medicine as there are known drug interactions with 5-fluoroucacil. Also, check with your doctor or pharmacist before starting any new prescription or over-the-counter medicines, or dietary supplements to make sure that there are no interactions.  When to Call the Doctor  Call your doctor or nurse if you have any of these symptoms and/or any new or unusual symptoms:  .  Fever of 100.4 F (38 C) or higher  . Chills  . Easy bleeding or bruising  . Nose bleed that doesn't stop bleeding after 10-15 minutes  . Trouble breathing  . Feeling dizzy or lightheaded  . Feeling that your heart is beating in a fast or not normal way (palpitations)  . Chest pain or symptoms of a heart attack. Most heart attacks involve pain in the center of the chest that lasts more than a few minutes. The pain may go away and come back or it can be constant. It can feel like pressure, squeezing, fullness, or pain. Sometimes pain is felt in one or both arms, the back, neck, jaw, or stomach. If any of these symptoms last 2 minutes, call 911.  Marland Kitchen Confusion and/or agitation  . Hallucinations  . Trouble understanding or speaking  . Loss of control of bowels or bladder  . Blurry vision or changes in your eyesight  . Headache that does not go away  . Numbness or lack of strength to your arms, legs, face, or body  . Nausea that stops you from eating or drinking and/or is not relieved by prescribed medicines  . Throwing up more than 3 times a day  . Diarrhea, 4 times in one day or diarrhea with lack of strength or a feeling of being dizzy  . Pain in your mouth or throat that makes it hard to eat or drink  . Pain along the digestive tract - especially if worse after eating  . Blood in your vomit (bright red or coffee-ground) and/or stools (bright red, or black/tarry)  . Coughing  up blood  . Tiredness that interferes with your daily activities  . Painful, red, or swollen areas on your hands or feet or around your nails  . A new rash or a rash that is not relieved by prescribed medicines  . Develop sensitivity to sunlight/light  . Numbness and/or tingling of your hands and/or feet  . Signs of allergic reaction: swelling of the face, feeling like your tongue or throat are swelling, trouble breathing, rash, itching, fever, chills, feeling dizzy, and/or feeling that your heart is beating in a fast or not normal way. If this happens, call 911 for emergency care.  . If you think you are pregnant or may have impregnated your partner  Reproduction Warnings  . Pregnancy warning: This drug may have harmful effects on the unborn baby. Women of child bearing potential should use effective methods of birth control during your cancer treatment and 3 months after treatment. Men with male partners of childbearing potential should use effective methods of birth control during your cancer treatment and for 3 months after your cancer treatment. Let your doctor know right away if you think you may be pregnant or may have impregnated your partner.  . Breastfeeding warning: It is not known if this drug passes into breast milk. For this reason, Women should not breastfeed during treatment because this drug could enter the breast milk and cause harm to a breastfeeding baby.  . Fertility warning: In men and women both, this drug may affect your ability to have children in the future. Talk with your doctor or nurse if you plan to have children. Ask for information on sperm or egg banking.  SELF CARE ACTIVITIES WHILE ON CHEMOTHERAPY:  Hydration Increase your fluid intake 48 hours prior to treatment and drink at least 8 to 12 cups (64 ounces) of water/decaffeinated beverages per day after treatment. You can still  have your cup of coffee or soda but these beverages do not count as  part of your 8 to 12 cups that you need to drink daily. No alcohol intake.  Medications Continue taking your normal prescription medication as prescribed.  If you start any new herbal or new supplements please let us know first to make sure it is safe.  Mouth Care Have teeth cleaned professionally before starting treatment. Keep dentures and partial plates clean. Use soft toothbrush and do not use mouthwashes that contain alcohol. Biotene is a good mouthwash that is available at most pharmacies or may be ordered by calling [phone]. Use warm salt water gargles (1 teaspoon salt per 1 quart warm water) before and after meals and at bedtime. Or you may rinse with 2 tablespoons of three-percent hydrogen peroxide mixed in eight ounces of water. If you are still having problems with your mouth or sores in your mouth please call the clinic. If you need dental work, please let the doctor know before you go for your appointment so that we can coordinate the best possible time for you in regards to your chemo regimen. You need to also let your dentist know that you are actively taking chemo. We may need to do labs prior to your dental appointment.  Skin Care Always use sunscreen that has not expired and with SPF (Sun Protection Factor) of 50 or higher. Wear hats to protect your head from the sun. Remember to use sunscreen on your hands, ears, face, & feet.  Use good moisturizing lotions such as udder cream, eucerin, or even Vaseline. Some chemotherapies can cause dry skin, color changes in your skin and nails.    . Avoid long, hot showers or baths. . Use gentle, fragrance-free soaps and laundry detergent. . Use moisturizers, preferably creams or ointments rather than lotions because the thicker consistency is better at preventing skin dehydration. Apply the cream or ointment within 15 minutes of showering. Reapply moisturizer at night, and moisturize your hands every time after you wash them.  Hair Loss  (if your doctor says your hair will fall out)  . If your doctor says that your hair is likely to fall out, decide before you begin chemo whether you want to wear a wig. You may want to shop before treatment to match your hair color. . Hats, turbans, and scarves can also camouflage hair loss, although some people prefer to leave their heads uncovered. If you go bare-headed outdoors, be sure to use sunscreen on your scalp. . Cut your hair short. It eases the inconvenience of shedding lots of hair, but it also can reduce the emotional impact of watching your hair fall out. . Don't perm or color your hair during chemotherapy. Those chemical treatments are already damaging to hair and can enhance hair loss. Once your chemo treatments are done and your hair has grown back, it's OK to resume dyeing or perming hair.  With chemotherapy, hair loss is almost always temporary. But when it grows back, it may be a different color or texture. In older adults who still had hair color before chemotherapy, the new growth may be completely gray.  Often, new hair is very fine and soft.  Infection Prevention Please wash your hands for at least 30 seconds using warm soapy water. Handwashing is the #1 way to prevent the spread of germs. Stay away from sick people or people who are getting over a cold. If you develop respiratory systems such as green/yellow mucus production  or productive cough or persistent cough let us know and we will see if you need an antibiotic. It is a good idea to keep a pair of gloves on when going into grocery stores/[employer] to decrease your risk of coming into contact with germs on the carts, etc. Carry alcohol hand gel with you at all times and use it frequently if out in public. If your temperature reaches 100.5 or higher please call the clinic and let us know.  If it is after hours or on the weekend please go to the ER if your temperature is over 100.5.  Please have your own personal thermometer at  home to use.    Sex and bodily fluids If you are going to have sex, a condom must be used to protect the person that isn't taking chemotherapy. Chemo can decrease your libido (sex drive). For a few days after chemotherapy, chemotherapy can be excreted through your bodily fluids.  When using the toilet please close the lid and flush the toilet twice.  Do this for a few day after you have had chemotherapy.   Effects of chemotherapy on your sex life Some changes are simple and won't last long. They won't affect your sex life permanently.  Sometimes you may feel: . too tired . not strong enough to be very active . sick or sore  . not in the mood . anxious or low Your anxiety might not seem related to sex. For example, you may be worried about the cancer and how your treatment is going. Or you may be worried about money, or about how you family are coping with your illness. These things can cause stress, which can affect your interest in sex. It's important to talk to your partner about how you feel. Remember - the changes to your sex life don't usually last long. There's usually no medical reason to stop having sex during chemo. The drugs won't have any long term physical effects on your performance or enjoyment of sex. Cancer can't be passed on to your partner during sex  Contraception It's important to use reliable contraception during treatment. Avoid getting pregnant while you or your partner are having chemotherapy. This is because the drugs may harm the baby. Sometimes chemotherapy drugs can leave a man or woman infertile.  This means you would not be able to have children in the future. You might want to talk to someone about permanent infertility. It can be very difficult to learn that you may no longer be able to have children. Some people find counselling helpful. There might be ways to preserve your fertility, although this is easier for men than for women. You may want to speak to a  fertility expert. You can talk about sperm banking or harvesting your eggs. You can also ask about other fertility options, such as donor eggs. If you have or have had breast cancer, your doctor might advise you not to take the contraceptive pill. This is because the hormones in it might affect the cancer.  It is not known for sure whether or not chemotherapy drugs can be passed on through semen or secretions from the vagina. Because of this some doctors advise people to use a barrier method if you have sex during treatment. This applies to vaginal, anal or oral sex. Generally, doctors advise a barrier method only for the time you are actually having the treatment and for about a week after your treatment. Advice like this can be worrying, but this  does not mean that you have to avoid being intimate with your partner. You can still have close contact with your partner and continue to enjoy sex.  Animals If you have cats or birds we just ask that you not change the litter or change the cage.  Please have someone else do this for you while you are on chemotherapy.   Food Safety During and After Cancer Treatment Food safety is important for people both during and after cancer treatment. Cancer and cancer treatments, such as chemotherapy, radiation therapy, and stem cell/bone marrow transplantation, often weaken the immune system. This makes it harder for your body to protect itself from foodborne illness, also called food poisoning. Foodborne illness is caused by eating food that contains harmful bacteria, parasites, or viruses.  Foods to avoid Some foods have a higher risk of becoming tainted with bacteria. These include: Marland Kitchen Unwashed fresh fruit and vegetables, especially leafy vegetables that can hide dirt and other contaminants . Raw sprouts, such as alfalfa sprouts . Raw or undercooked beef, especially ground beef, or other raw or undercooked meat and poultry . Fatty, fried, or spicy foods  immediately before or after treatment.  These can sit heavy on your stomach and make you feel nauseous. . Raw or undercooked shellfish, such as oysters. . Sushi and sashimi, which often contain raw fish.  . Unpasteurized beverages, such as unpasteurized fruit juices, raw milk, raw yogurt, or cider . Undercooked eggs, such as soft boiled, over easy, and poached; raw, unpasteurized eggs; or foods made with raw egg, such as homemade raw cookie dough and homemade mayonnaise  Simple steps for food safety  Shop smart. . Do not buy food stored or displayed in an unclean area. . Do not buy bruised or damaged fruits or vegetables. . Do not buy cans that have cracks, dents, or bulges. . Pick up foods that can spoil at the end of your shopping trip and store them in a cooler on the way home.  Prepare and clean up foods carefully. . Rinse all fresh fruits and vegetables under running water, and dry them with a clean towel or paper towel. . Clean the top of cans before opening them. . After preparing food, wash your hands for 20 seconds with hot water and soap. Pay special attention to areas between fingers and under nails. . Clean your utensils and dishes with hot water and soap. Marland Kitchen Disinfect your kitchen and cutting boards using 1 teaspoon of liquid, unscented bleach mixed into 1 quart of water.    Dispose of old food. . Eat canned and packaged food before its expiration date (the "use by" or "best before" date). . Consume refrigerated leftovers within 3 to 4 days. After that time, throw out the food. Even if the food does not smell or look spoiled, it still may be unsafe. Some bacteria, such as Listeria, can grow even on foods stored in the refrigerator if they are kept for too long.  Take precautions when eating out. . At restaurants, avoid buffets and salad bars where food sits out for a long time and comes in contact with many people. Food can become contaminated when someone with a virus, often a  norovirus, or another "bug" handles it. . Put any leftover food in a "to-go" container yourself, rather than having the server do it. And, refrigerate leftovers as soon as you get home. . Choose restaurants that are clean and that are willing to prepare your food as you order it  cooked.   MEDICATIONS:                                                                                                                                                                Compazine/Prochlorperazine 10mg  tablet. Take 1 tablet every 6 hours as needed for nausea/vomiting. (This can make you sleepy)   EMLA cream. Apply a quarter size amount to port site 1 hour prior to chemo. Do not rub in. Cover with plastic wrap.   Over-the-Counter Meds:  Colace - 100 mg capsules - take 2 capsules daily.  If this doesn't help then you can increase to 2 capsules twice daily.  Call us if this does not help your bowels move.   Imodium 2mg  capsule. Take 2 capsules after the 1st loose stool and then 1 capsule every 2 hours until you go a total of 12 hours without having a loose stool. Call the [facility] if loose stools continue. If diarrhea occurs at bedtime, take 2 capsules at bedtime. Then take 2 capsules every 4 hours until morning. Call [facility].    Diarrhea Sheet   If you are having loose stools/diarrhea, please purchase Imodium and begin taking as outlined:  At the first sign of poorly formed or loose stools you should begin taking Imodium (loperamide) 2 mg capsules.  Take two tablets (4mg ) followed by one tablet (2mg ) every 2 hours - DO NOT EXCEED 8 tablets in 24 hours.  If it is bedtime and you are having loose stools, take 2 tablets at bedtime, then 2 tablets every 4 hours until morning.   Always call the [facility] if you are having loose stools/diarrhea that you can't get under control.  Loose stools/diarrhea leads to dehydration (loss of water) in your body.  We have other options of trying to get the  loose stools/diarrhea to stop but you must let us know!   Constipation Sheet  Colace - 100 mg capsules - take 2 capsules daily.  If this doesn't help then you can increase to 2 capsules twice daily.  Please call if the above does not work for you.   Do not go more than 2 days without a bowel movement.  It is very important that you do not become constipated.  It will make you feel sick to your stomach (nausea) and can cause abdominal pain and vomiting.   Nausea Sheet   Compazine/Prochlorperazine 10mg  tablet. Take 1 tablet every 6 hours as needed for nausea/vomiting. (This can make you sleepy)  If you are having persistent nausea (nausea that does not stop) please call the [facility] and let us know the amount of nausea that you are experiencing.  If you begin to vomit, you need to call the [facility] and if it is  the weekend and you have vomited more than one time and can't get it to stop-go to the Emergency Room.  Persistent nausea/vomiting can lead to dehydration (loss of fluid in your body) and will make you feel terrible.   Ice chips, sips of clear liquids, foods that are @ room temperature, crackers, and toast tend to be better tolerated.   SYMPTOMS TO REPORT AS SOON AS POSSIBLE AFTER TREATMENT:   FEVER GREATER THAN 100.5 F  CHILLS WITH OR WITHOUT FEVER  NAUSEA AND VOMITING THAT IS NOT CONTROLLED WITH YOUR NAUSEA MEDICATION  UNUSUAL SHORTNESS OF BREATH  UNUSUAL BRUISING OR BLEEDING  TENDERNESS IN MOUTH AND THROAT WITH OR WITHOUT PRESENCE OF ULCERS  URINARY PROBLEMS  BOWEL PROBLEMS  UNUSUAL RASH      Wear comfortable clothing and clothing appropriate for easy access to any Portacath or PICC line. Let us know if there is anything that we can do to make your therapy better!    What to do if you need assistance after hours or on the weekends: CALL [phone].  HOLD on the line, do not hang up.  You will hear multiple messages but at the end you will be  connected with a nurse triage line.  They will contact the doctor if necessary.  Most of the time they will be able to assist you.  Do not call the hospital operator.      I have been informed and understand all of the instructions given to me and have received a copy. I have been instructed to call the clinic [phone] or my family physician as soon as possible for continued medical care, if indicated. I do not have any more questions at this time but understand that I may call the [facility] or the Patient Navigator at [phone] during office hours should I have questions or need assistance in obtaining follow-up care.

## 2018-12-23 ENCOUNTER — Ambulatory Visit (HOSPITAL_COMMUNITY): Payer: PRIVATE HEALTH INSURANCE

## 2018-12-23 ENCOUNTER — Ambulatory Visit (HOSPITAL_COMMUNITY): Payer: PRIVATE HEALTH INSURANCE | Admitting: Anesthesiology

## 2018-12-23 ENCOUNTER — Encounter (HOSPITAL_COMMUNITY)
Admission: RE | Disposition: A | Discharge status: Home / Self Care | Payer: Self-pay | Source: Home / Self Care | Attending: General Surgery

## 2018-12-23 ENCOUNTER — Ambulatory Visit (HOSPITAL_COMMUNITY)
Admission: RE | Admit: 2018-12-23 | Discharge: 2018-12-23 | Disposition: A | Discharge status: Home / Self Care | Payer: PRIVATE HEALTH INSURANCE | Attending: General Surgery | Admitting: General Surgery

## 2018-12-23 DIAGNOSIS — Z87442 Personal history of urinary calculi: Secondary | ICD-10-CM | POA: Diagnosis not present

## 2018-12-23 DIAGNOSIS — E1140 Type 2 diabetes mellitus with diabetic neuropathy, unspecified: Secondary | ICD-10-CM | POA: Diagnosis not present

## 2018-12-23 DIAGNOSIS — Z01818 Encounter for other preprocedural examination: Secondary | ICD-10-CM

## 2018-12-23 DIAGNOSIS — Z79899 Other long term (current) drug therapy: Secondary | ICD-10-CM | POA: Insufficient documentation

## 2018-12-23 DIAGNOSIS — Z823 Family history of stroke: Secondary | ICD-10-CM | POA: Diagnosis not present

## 2018-12-23 DIAGNOSIS — Z800 Family history of malignant neoplasm of digestive organs: Secondary | ICD-10-CM | POA: Insufficient documentation

## 2018-12-23 DIAGNOSIS — Z7982 Long term (current) use of aspirin: Secondary | ICD-10-CM | POA: Insufficient documentation

## 2018-12-23 DIAGNOSIS — E039 Hypothyroidism, unspecified: Secondary | ICD-10-CM | POA: Insufficient documentation

## 2018-12-23 DIAGNOSIS — C259 Malignant neoplasm of pancreas, unspecified: Secondary | ICD-10-CM | POA: Insufficient documentation

## 2018-12-23 DIAGNOSIS — M4802 Spinal stenosis, cervical region: Secondary | ICD-10-CM | POA: Insufficient documentation

## 2018-12-23 DIAGNOSIS — I10 Essential (primary) hypertension: Secondary | ICD-10-CM | POA: Diagnosis not present

## 2018-12-23 DIAGNOSIS — I700 Atherosclerosis of aorta: Secondary | ICD-10-CM | POA: Insufficient documentation

## 2018-12-23 DIAGNOSIS — Z8269 Family history of other diseases of the musculoskeletal system and connective tissue: Secondary | ICD-10-CM | POA: Diagnosis not present

## 2018-12-23 DIAGNOSIS — Z794 Long term (current) use of insulin: Secondary | ICD-10-CM | POA: Insufficient documentation

## 2018-12-23 DIAGNOSIS — Z9049 Acquired absence of other specified parts of digestive tract: Secondary | ICD-10-CM | POA: Diagnosis not present

## 2018-12-23 DIAGNOSIS — Z8249 Family history of ischemic heart disease and other diseases of the circulatory system: Secondary | ICD-10-CM | POA: Diagnosis not present

## 2018-12-23 DIAGNOSIS — I6502 Occlusion and stenosis of left vertebral artery: Secondary | ICD-10-CM | POA: Insufficient documentation

## 2018-12-23 DIAGNOSIS — Z95828 Presence of other vascular implants and grafts: Secondary | ICD-10-CM

## 2018-12-23 DIAGNOSIS — Z8673 Personal history of transient ischemic attack (TIA), and cerebral infarction without residual deficits: Secondary | ICD-10-CM | POA: Diagnosis not present

## 2018-12-23 DIAGNOSIS — Z885 Allergy status to narcotic agent status: Secondary | ICD-10-CM | POA: Insufficient documentation

## 2018-12-23 HISTORY — PX: PORTACATH PLACEMENT: SHX2246

## 2018-12-23 LAB — GLUCOSE, CAPILLARY
Glucose-Capillary: 178 mg/dL — ABNORMAL HIGH (ref 70–99)
Glucose-Capillary: 165 mg/dL — ABNORMAL HIGH (ref 70–99)

## 2018-12-23 SURGERY — INSERTION, TUNNELED CENTRAL VENOUS DEVICE, WITH PORT
Anesthesia: Monitor Anesthesia Care | Site: Chest | Laterality: Left

## 2018-12-23 MED ORDER — HYDROMORPHONE HCL 1 MG/ML IJ SOLN: 0.2500 mg | INTRAMUSCULAR | Status: DC | PRN

## 2018-12-23 MED ORDER — SODIUM CHLORIDE (PF) 0.9 % IJ SOLN
INTRAMUSCULAR | Status: DC | PRN
  Administered 2018-12-23: 10 mL via INTRAVENOUS

## 2018-12-23 MED ORDER — PROPOFOL 500 MG/50ML IV EMUL
INTRAVENOUS | Status: DC | PRN
  Administered 2018-12-23: 125 ug/kg/min via INTRAVENOUS

## 2018-12-23 MED ORDER — CHLORHEXIDINE GLUCONATE CLOTH 2 % EX PADS: 6.0000 | MEDICATED_PAD | Freq: Once | CUTANEOUS | Status: DC

## 2018-12-23 MED ORDER — PROMETHAZINE HCL 25 MG/ML IJ SOLN: 6.2500 mg | INTRAMUSCULAR | Status: DC | PRN

## 2018-12-23 MED ORDER — PROPOFOL 10 MG/ML IV BOLUS
INTRAVENOUS | Status: AC
  Filled 2018-12-23: qty 20

## 2018-12-23 MED ORDER — LIDOCAINE HCL (PF) 1 % IJ SOLN
INTRAMUSCULAR | Status: AC
  Filled 2018-12-23: qty 30

## 2018-12-23 MED ORDER — HEPARIN SOD (PORK) LOCK FLUSH 100 UNIT/ML IV SOLN
INTRAVENOUS | Status: DC | PRN
  Administered 2018-12-23: 500 [IU] via INTRAVENOUS

## 2018-12-23 MED ORDER — LACTATED RINGERS IV SOLN
INTRAVENOUS | Status: DC
  Administered 2018-12-23: 08:00:00 via INTRAVENOUS

## 2018-12-23 MED ORDER — HEPARIN SOD (PORK) LOCK FLUSH 100 UNIT/ML IV SOLN
INTRAVENOUS | Status: AC
  Filled 2018-12-23: qty 5

## 2018-12-23 MED ORDER — FENTANYL CITRATE (PF) 100 MCG/2ML IJ SOLN
INTRAMUSCULAR | Status: AC
  Filled 2018-12-23: qty 2

## 2018-12-23 MED ORDER — LACTATED RINGERS IV SOLN: INTRAVENOUS | Status: DC

## 2018-12-23 MED ORDER — FENTANYL CITRATE (PF) 100 MCG/2ML IJ SOLN
INTRAMUSCULAR | Status: DC | PRN
  Administered 2018-12-23 (×2): 25 ug via INTRAVENOUS

## 2018-12-23 MED ORDER — HYDROCODONE-ACETAMINOPHEN 7.5-325 MG PO TABS: 1.0000 | ORAL_TABLET | Freq: Once | ORAL | Status: DC | PRN

## 2018-12-23 MED ORDER — LIDOCAINE HCL (PF) 1 % IJ SOLN
INTRAMUSCULAR | Status: DC | PRN
  Administered 2018-12-23: 5 mL
  Administered 2018-12-23: 8 mL

## 2018-12-23 MED ORDER — CEFAZOLIN SODIUM-DEXTROSE 2-4 GM/100ML-% IV SOLN
2.0000 g | INTRAVENOUS | Status: AC
  Administered 2018-12-23: 08:00:00 2 g via INTRAVENOUS
  Filled 2018-12-23: qty 100

## 2018-12-23 MED ORDER — MEPERIDINE HCL 50 MG/ML IJ SOLN: 6.2500 mg | INTRAMUSCULAR | Status: DC | PRN

## 2018-12-23 MED ORDER — TICAGRELOR 90 MG PO TABS: 90.0000 mg | ORAL_TABLET | Freq: Two times a day (BID) | ORAL | Status: DC

## 2018-12-23 SURGICAL SUPPLY — 34 items
BAG DECANTER FOR FLEXI CONT (MISCELLANEOUS) ×2 IMPLANT
CHLORAPREP W/TINT 10.5 ML (MISCELLANEOUS) ×2 IMPLANT
CLOTH BEACON ORANGE TIMEOUT ST (SAFETY) ×2 IMPLANT
COVER LIGHT HANDLE STERIS (MISCELLANEOUS) ×4 IMPLANT
COVER WAND RF STERILE (DRAPES) ×2 IMPLANT
DECANTER SPIKE VIAL GLASS SM (MISCELLANEOUS) ×2 IMPLANT
DERMABOND ADVANCED (GAUZE/BANDAGES/DRESSINGS) ×1
DERMABOND ADVANCED .7 DNX12 (GAUZE/BANDAGES/DRESSINGS) ×1 IMPLANT
DRAPE C-ARM FOLDED MOBILE STRL (DRAPES) ×2 IMPLANT
ELECT REM PT RETURN 9FT ADLT (ELECTROSURGICAL) ×2
ELECTRODE REM PT RTRN 9FT ADLT (ELECTROSURGICAL) ×1 IMPLANT
GLOVE BIO SURGEON STRL SZ 6.5 (GLOVE) ×2 IMPLANT
GLOVE BIOGEL PI IND STRL 6.5 (GLOVE) ×1 IMPLANT
GLOVE BIOGEL PI IND STRL 7.0 (GLOVE) ×2 IMPLANT
GLOVE BIOGEL PI INDICATOR 6.5 (GLOVE) ×1
GLOVE BIOGEL PI INDICATOR 7.0 (GLOVE) ×2
GLOVE ECLIPSE 6.5 STRL STRAW (GLOVE) ×2 IMPLANT
GOWN STRL REUS W/TWL LRG LVL3 (GOWN DISPOSABLE) ×4 IMPLANT
IV NS 500ML (IV SOLUTION) ×1
IV NS 500ML BAXH (IV SOLUTION) ×1 IMPLANT
KIT PORT POWER 8FR ISP MRI (Port) ×2 IMPLANT
KIT TURNOVER KIT A (KITS) ×2 IMPLANT
MANIFOLD NEPTUNE II (INSTRUMENTS) ×2 IMPLANT
NEEDLE HYPO 25X1 1.5 SAFETY (NEEDLE) ×2 IMPLANT
PACK MINOR (CUSTOM PROCEDURE TRAY) ×2 IMPLANT
PAD ARMBOARD 7.5X6 YLW CONV (MISCELLANEOUS) ×2 IMPLANT
SET BASIN LINEN APH (SET/KITS/TRAYS/PACK) ×2 IMPLANT
SUT MNCRL AB 4-0 PS2 18 (SUTURE) ×2 IMPLANT
SUT PROLENE 2 0 SH 30 (SUTURE) ×2 IMPLANT
SUT VIC AB 3-0 SH 27 (SUTURE) ×1
SUT VIC AB 3-0 SH 27X BRD (SUTURE) ×1 IMPLANT
SYR 10ML LL (SYRINGE) ×2 IMPLANT
SYR 5ML LL (SYRINGE) ×2 IMPLANT
SYR CONTROL 10ML LL (SYRINGE) ×2 IMPLANT

## 2018-12-23 NOTE — Transfer of Care (Signed)
Immediate Anesthesia Transfer of Care Note  Patient: Mark Foster  Procedure(s) Performed: INSERTION PORT-A-CATH (Left Chest)  Patient Location: PACU  Anesthesia Type:MAC  Level of Consciousness: awake and patient cooperative  Airway & Oxygen Therapy: Patient Spontanous Breathing  Post-op Assessment: Report given to RN and Post -op Vital signs reviewed and stable  Post vital signs: Reviewed and stable  Last Vitals:  Vitals Value Taken Time  BP    Temp    Pulse 72 12/23/2018  8:24 AM  Resp 11 12/23/2018  8:24 AM  SpO2 98 % 12/23/2018  8:24 AM  Vitals shown include unvalidated device data.  Last Pain:  Vitals:   12/23/18 0715  PainSc: 0-No pain         Complications: No apparent anesthesia complications

## 2018-12-23 NOTE — Op Note (Signed)
Operative Note 12/23/18   Preoperative Diagnosis: Pancreatic cancer    Postoperative Diagnosis: Same   Procedure(s) Performed: Port-A-Cath placement, left subclavian    Surgeon: Lanell Matar. Constance Haw, MD   Assistants: No qualified resident was available   Anesthesia: Monitored anesthesia care   Anesthesiologist: Vena Rua, MD    Specimens: None   Estimated Blood Loss: Minimal   Blood Replacement: None    Complications: None    Operative Findings: Normal anatomy   Indications: Mark Foster is a 63 yo with a newly diagnosed pancreatic cancer who will be starting chemotherapy. He has been referred for a port a catheter placement, and we discussed the risk and benefits of placement including but not limited to the need for fluoroscopy, the risk of bleeding, infection, pneumothorax, and malfunction. He has opted to proceed.  His preoperative CXR showed no active infiltrates.   Procedure: The patient was brought into the operating room and monitored anesthesia was induced.  One percent lidocaine was used for local anesthesia.   The left chest and neck was prepped and draped in the usual sterile fashion.  Preoperative antibiotics were given.  An incision was made below the left clavicle. A subcutaneous pocket was formed. The needles advanced into the left subclavian vein using the Seldinger technique without difficulty. A guidewire was then advanced into the right atrium under fluoroscopic guidance.  Ectopia was not noted. An introducer and peel-away sheath were placed over the guidewire. The catheter was then inserted through the peel-away sheath and the peel-away sheath was removed.  A spot film was performed to confirm the position. The catheter was then attached to the port and the port placed in subcutaneous pocket. Adequate positioning was confirmed by fluoroscopy. Hemostasis was confirmed, and the port was secured with 2-0 prolene sutures.  Good backflow of blood was noted on  aspiration of the port. The port was flushed with heparin flush. Subcutaneous layer was reapproximated using a 3-0 Vicryl interrupted suture. The skin was closed using a 4-0 Vicryl subcuticular suture. Dermabond was applied.  All tape and needle counts were correct at the end of the procedure. The patient was transferred to PACU in stable condition. A chest x-ray will be performed at that time.  [name], MD [facility] [address] [place], [place] [address] [phone] (office)

## 2018-12-23 NOTE — Discharge Instructions (Signed)
Keep area clean and dry. You can take a shower in 24 hours. Do not submerge in water.  Take tylenol and ibuprofen for pain control and your Tramadol for severe pain. Restart your Brilinta on 12/25/2018, Wednesday.    Implanted Port Insertion, Care After This sheet gives you information about how to care for yourself after your procedure. Your health care provider may also give you more specific instructions. If you have problems or questions, contact your health care provider. What can I expect after the procedure? After the procedure, it is common to have:  Discomfort at the port insertion site.  Bruising on the skin over the port. This should improve over 3-4 days. Follow these instructions at home: [facility] care  After your port is placed, you will get a manufacturer's information card. The card has information about your port. Keep this card with you at all times.  Take care of the port as told by your health care provider. Ask your health care provider if you or a family member can get training for taking care of the port at home. A home health care nurse may also take care of the port.  Make sure to remember what type of port you have. Incision care      Follow instructions from your health care provider about how to take care of your port insertion site. Make sure you: ? Wash your hands with soap and water before and after you change your bandage (dressing). If soap and water are not available, use hand sanitizer. ? Change your dressing as told by your health care provider. ? Leave stitches (sutures), skin glue, or adhesive strips in place. These skin closures may need to stay in place for 2 weeks or longer. If adhesive strip edges start to loosen and curl up, you may trim the loose edges. Do not remove adhesive strips completely unless your health care provider tells you to do that.  Check your port insertion site every day for signs of infection. Check for: ? Redness, swelling, or  pain. ? Fluid or blood. ? Warmth. ? Pus or a bad smell. Activity  Return to your normal activities as told by your health care provider. Ask your health care provider what activities are safe for you.  Do not lift anything that is heavier than 10 lb (4.5 kg) for about 1 week.  You may shower. Do not take a bath or submerge in water until the skin is completely healed (about 4 weeks+).  General instructions  Take over-the-counter and prescription medicines only as told by your health care provider.  Do not take baths, swim, or use a hot tub until your health care provider approves. Ask your health care provider if you may take showers. You may only be allowed to take sponge baths.  Do not drive for 24 hours if you were given a sedative during your procedure.  Wear a medical alert bracelet in case of an emergency. This will tell any health care providers that you have a port.  Keep all follow-up visits as told by your health care provider. This is important. Contact a health care provider if:  You cannot flush your port with saline as directed, or you cannot draw blood from the port.  You have a fever or chills.  You have redness, swelling, or pain around your port insertion site.  You have fluid or blood coming from your port insertion site.  Your port insertion site feels warm to the touch.  You have pus or a bad smell coming from the port insertion site. Get help right away if:  You have chest pain or shortness of breath.  You have bleeding from your port that you cannot control. Summary  Take care of the port as told by your health care provider. Keep the manufacturer's information card with you at all times.  Change your dressing as told by your health care provider.  Contact a health care provider if you have a fever or chills or if you have redness, swelling, or pain around your port insertion site.  Keep all follow-up visits as told by your health care  provider. This information is not intended to replace advice given to you by your health care provider. Make sure you discuss any questions you have with your health care provider. Document Released: 06/11/2013 Document Revised: 03/19/2018 Document Reviewed: 03/19/2018 Elsevier Interactive Patient Education  2019 [place] POST-ANESTHESIA  IMMEDIATELY FOLLOWING SURGERY:  Do not drive or operate machinery for the first twenty four hours after surgery.  Do not make any important decisions for twenty four hours after surgery or while taking narcotic pain medications or sedatives.  If you develop intractable nausea and vomiting or a severe headache please notify your doctor immediately.  FOLLOW-UP:  Please make an appointment with your surgeon as instructed. You do not need to follow up with anesthesia unless specifically instructed to do so.  WOUND CARE INSTRUCTIONS (if applicable):  Keep a dry clean dressing on the anesthesia/puncture wound site if there is drainage.  Once the wound has quit draining you may leave it open to air.  Generally you should leave the bandage intact for twenty four hours unless there is drainage.  If the epidural site drains for more than 36-48 hours please call the anesthesia department.  QUESTIONS?:  Please feel free to call your physician or the hospital operator if you have any questions, and they will be happy to assist you.

## 2018-12-23 NOTE — Anesthesia Procedure Notes (Signed)
Procedure Name: MAC Date/Time: 12/23/2018 7:36 AM Performed by: Vista Deck, CRNA Pre-anesthesia Checklist: Patient identified, Emergency Drugs available, Suction available, Timeout performed and Patient being monitored Patient Re-evaluated:Patient Re-evaluated prior to induction Oxygen Delivery Method: Non-rebreather mask

## 2018-12-23 NOTE — Anesthesia Preprocedure Evaluation (Addendum)
Anesthesia Evaluation    Airway Mallampati: II       Dental  (+) Upper Dentures, Lower Dentures   Pulmonary    breath sounds clear to auscultation       Cardiovascular hypertension, Pt. on medications and On Medications  Rhythm:regular     Neuro/Psych CVA, No Residual Symptoms    GI/Hepatic   Endo/Other  diabetes, Type 2Hypothyroidism   Renal/GU      Musculoskeletal   Abdominal   Peds  Hematology   Anesthesia Other Findings S/p pancreatic resection 4 weeks ago, pending chemo B/l LE neuropathy CVA s/p stenting 7/19  Reproductive/Obstetrics                            Anesthesia Physical Anesthesia Plan  ASA: III  Anesthesia Plan: MAC   Post-op Pain Management:    Induction:   PONV Risk Score and Plan:   Airway Management Planned:   Additional Equipment:   Intra-op Plan:   Post-operative Plan:   Informed Consent: I have reviewed the patients History and Physical, chart, labs and discussed the procedure including the risks, benefits and alternatives for the proposed anesthesia with the patient or authorized representative who has indicated his/her understanding and acceptance.       Plan Discussed with: Anesthesiologist  Anesthesia Plan Comments:         Anesthesia Quick Evaluation

## 2018-12-23 NOTE — H&P (Signed)
[facility] History and Physical  Reason for Referral: Pancreatic cancer/ Port a Catheter  Referring Physician:  Dr. Karleen [place] [name] is a 63 y.o. male.  HPI: Mark Foster is a 63 yo with a history of DM, hypothyroidism, MCA in 7/ 2019 s/p vertebral stent, and pancreatic cancer s/p resection at [facility] 11/20/2018. He has been doing fair since his surgery, but did have a wound dehiscence and has been doing a packing to the area. He is going to be starting his chemotherapy regimen, and was sent to me for a port a catheter placement. He is on Brilinta and we held this 5 days ago for this procedure.  He otherwise reports no new fevers, sore throat, aches, new cough.   Past Medical History:  Diagnosis Date  . Cervical compression fracture ([place])   . Cervical spinal stenosis   . Diabetic neuropathy ([facility])    Bilateral legs  . Diverticulitis   . Family history of pancreatic cancer   . History of kidney stones   . Hypothyroidism   . Pancreatic cancer ([place])   . Stenosis of left vertebral artery   . Stroke [facility])    2011  . Type 2 diabetes mellitus ([place])     Past Surgical History:  Procedure Laterality Date  . APPENDECTOMY    . COLON RESECTION     For diverticulitis  . COLON SURGERY    . ESOPHAGOGASTRODUODENOSCOPY N/A 10/03/2018   Procedure: ESOPHAGOGASTRODUODENOSCOPY (EGD);  Surgeon: [name], MD;  Location: [name];  Service: Endoscopy;  Laterality: N/A;  . EUS N/A 10/03/2018   Procedure: UPPER ENDOSCOPIC ULTRASOUND (EUS) RADIAL;  Surgeon: [name], MD;  Location: [facility];  Service: Endoscopy;  Laterality: N/A;  . FINE NEEDLE ASPIRATION N/A 10/03/2018   Procedure: FINE NEEDLE ASPIRATION (FNA) LINEAR;  Surgeon: [name], MD;  Location: [facility];  Service: Endoscopy;  Laterality: N/A;  . IR ANGIO INTRA EXTRACRAN SEL COM CAROTID INNOMINATE BILAT MOD SED  01/21/2018  . IR ANGIO INTRA EXTRACRAN SEL COM CAROTID INNOMINATE UNI R MOD SED   03/21/2018  . IR ANGIO VERTEBRAL SEL VERTEBRAL BILAT MOD SED  01/21/2018  . IR TRANSCATH EXCRAN VERT OR CAR A STENT  03/21/2018  . RADIOLOGY WITH ANESTHESIA N/A 03/21/2018   Procedure: IR WITH ANESTHESIA WITH STENT PLACEMENT;  Surgeon: Luanne Bras, MD;  Location: [facility];  Service: Radiology;  Laterality: N/A;  . ROTATOR CUFF REPAIR     Left    Family History  Problem Relation Age of Onset  . Stroke Mother   . Pancreatic cancer Father 76       d. 65  . Stroke Maternal Grandmother   . Heart attack Maternal Grandfather   . Heart Problems Paternal Grandfather   . Scoliosis Daughter   . Muscular dystrophy Grandson     Social History   Tobacco Use  . Smoking status: Never Smoker  . Smokeless tobacco: Never Used  Substance Use Topics  . Alcohol use: Not Currently  . Drug use: Never    Medications: I have reviewed the patient's current medications. Medications Prior to Admission  Medication Sig Dispense Refill Last Dose  . aspirin 81 MG tablet Take 1 tablet (81 mg total) by mouth daily. 30 tablet 3 12/22/2018 at Unknown time  . atorvastatin (LIPITOR) 80 MG tablet Take 1 tablet (80 mg total) by mouth daily at 6 PM. (Patient taking differently: Take 80 mg by mouth daily. ) 30 tablet  2 12/22/2018 at Unknown time  . ECHINACEA EXTRACT PO Take 2 tablets by mouth daily.    Taking  . gabapentin (NEURONTIN) 300 MG capsule Take 1 capsule (300 mg total) by mouth 2 (two) times daily. 60 capsule 3 12/22/2018 at Unknown time  . glipiZIDE (GLUCOTROL) 10 MG tablet Take 10 mg by mouth 2 (two) times daily as needed (blood sugar over 240).    12/22/2018 at Unknown time  . Insulin Glargine (BASAGLAR KWIKPEN) 100 UNIT/ML SOPN Inject 24 Units into the skin daily.    12/23/2018 at Unknown time  . NP THYROID 30 MG tablet Take 30 mg by mouth daily before breakfast.   2 12/22/2018 at Unknown time  . Omega-3 1000 MG CAPS Take 1,000 mg by mouth daily.   12/22/2018 at Unknown time  . pantoprazole (PROTONIX) 20 MG  tablet Take 20 mg by mouth daily.    12/22/2018 at Unknown time  . sertraline (ZOLOFT) 50 MG tablet Take 50 mg by mouth every morning.    12/22/2018 at Unknown time  . tamsulosin (FLOMAX) 0.4 MG CAPS capsule Take 0.4 mg by mouth daily.    12/22/2018 at Unknown time  . ticagrelor (BRILINTA) 90 MG TABS tablet Take 90 mg by mouth 2 (two) times daily.   12/22/2018 at Unknown time  . traMADol (ULTRAM) 50 MG tablet Take 1 tablet (50 mg total) by mouth every 8 (eight) hours as needed for up to 30 days. 90 tablet 2 Taking  . vitamin B-12 (CYANOCOBALAMIN) 1000 MCG tablet Take 1,000 mcg by mouth daily.   12/22/2018 at Unknown time  . Continuous Blood Gluc Sensor (FREESTYLE LIBRE 14 DAY SENSOR) MISC 1 Device by Does not apply route every 14 (fourteen) days. 2 each 2 Taking  . [START ON 12/30/2018] dextrose 5 % SOLN 1,000 mL with fluorouracil 5 GM/100ML SOLN Inject into the vein over 48 hr.     . [START ON 12/30/2018] FLUOROURACIL IV Inject into the vein every 14 (fourteen) days.     . insulin degludec (TRESIBA FLEXTOUCH) 100 UNIT/ML SOPN FlexTouch Pen Inject 0.2 mLs (20 Units total) into the skin daily. (Patient not taking: Reported on 12/18/2018) 5 pen 2 Not Taking at Unknown time  . Insulin Pen Needle 32G X 4 MM MISC 1 Device by Does not apply route daily. 50 each 2 Taking  . [START ON 12/30/2018] IRINOTECAN HCL IV Inject into the vein every 14 (fourteen) days.     Derrill Memo ON 12/30/2018] LEUCOVORIN CALCIUM IV Inject into the vein every 14 (fourteen) days.     Marland Kitchen lidocaine-prilocaine (EMLA) cream Apply pea-sized amount to port-a-cath site and cover with plastic wrap one hour prior to chemotherapy appointments 30 g 3   . loperamide (IMODIUM A-D) 2 MG tablet Take 1 tablet (2 mg total) by mouth as needed. Take 2 at diarrhea onset , then 1 every 2hr until 12hrs with no BM. May take 2 every 4hrs at night. If diarrhea recurs repeat. 100 tablet 1   . [START ON 12/30/2018] OXALIPLATIN IV Inject into the vein every 14 (fourteen)  days.     . prochlorperazine (COMPAZINE) 10 MG tablet Take 1 tablet (10 mg total) by mouth every 6 (six) hours as needed (NAUSEA). 30 tablet 1    Allergies  Allergen Reactions  . Demerol [Meperidine Hcl] Other (See Comments)    convulsions    ROS:  A comprehensive review of systems was negative except for: Respiratory: positive for ocassional chronic cough, nothing new or  productive Gastrointestinal: positive for small wound dehiscence  HR 81; BP 138/73; O2 Sat 98 on RA; RR 20 Physical Exam Vitals signs reviewed.  HENT:     Head: Normocephalic.     Right Ear: Tympanic membrane normal.     Nose: Nose normal.     Mouth/Throat:     Mouth: Mucous membranes are moist.  Eyes:     Pupils: Pupils are equal, round, and reactive to light.  Neck:     Musculoskeletal: Normal range of motion.  Cardiovascular:     Rate and Rhythm: Normal rate.  Pulmonary:     Effort: Pulmonary effort is normal.     Breath sounds: Normal breath sounds.  Abdominal:     General: There is no distension.     Palpations: Abdomen is soft.     Tenderness: There is no abdominal tenderness.     Comments: Healing LUQ incision, small area of dehiscence, no erythema and minor serous drainage on bandage   Musculoskeletal:        General: No swelling.  Skin:    General: Skin is warm and dry.  Neurological:     General: No focal deficit present.     Mental Status: He is alert and oriented to person, place, and time.  Psychiatric:        Mood and Affect: Mood normal.        Behavior: Behavior normal.        Thought Content: Thought content normal.        Judgment: Judgment normal.     Results: Results for orders placed or performed during the hospital encounter of 12/23/18 (from the past 48 hour(s))  Glucose, capillary     Status: Abnormal   Collection Time: 12/23/18  6:46 AM  Result Value Ref Range   Glucose-Capillary 178 (H) 70 - 99 mg/dL    CXR - personally reviewed- no infiltrates noted; read pending    Assessment & Plan:  Mark Foster is a 63 y.o. male with pancreatic cancer s/p resection 11/20/2018. He will be starting chemotherapy at [facility]. He will need a port a catheter placed for his therapy.   -Right handed, so will start on the left  -Discussed the risk and benefits of the procedureincluding but not limited to bleeding, infection, pneumothorax, possible malfunction of the port and need for replacement and discussed the need for  fluoroscopy  -CXR preop for asymptomatic COVID 19 without any infiltrates on my review  -Will call his sister after the procedure -Brilinta will be restarted likely 4/22, will make that decision post procedure   All questions were answered to the satisfaction of the patient.   Virl Cagey 12/23/2018, 7:24 AM

## 2018-12-23 NOTE — Progress Notes (Addendum)
CXR with port in the SVC. No pneumothorax.  Joelene Millin called and left a message and notified that all went well.   [name], MD [facility] [address] [place], [place] [address] [phone] (office)

## 2018-12-23 NOTE — Anesthesia Postprocedure Evaluation (Signed)
Anesthesia Post Note  Patient: Mark Foster  Procedure(s) Performed: INSERTION PORT-A-CATH (Left Chest)  Patient location during evaluation: Short Stay Anesthesia Type: MAC Level of consciousness: awake and alert and patient cooperative Pain management: satisfactory to patient Vital Signs Assessment: post-procedure vital signs reviewed and stable Respiratory status: spontaneous breathing Cardiovascular status: stable Postop Assessment: no apparent nausea or vomiting Anesthetic complications: no     Last Vitals:  Vitals:   12/23/18 0845 12/23/18 0855  BP: 118/83 108/63  Pulse: 69 71  Resp: (!) 9 16  Temp:  36.6 C  SpO2: 98% 97%    Last Pain:  Vitals:   12/23/18 0855  TempSrc: Oral  PainSc: 0-No pain                 Karris Deangelo

## 2018-12-24 ENCOUNTER — Telehealth (HOSPITAL_COMMUNITY): Payer: Self-pay

## 2018-12-24 ENCOUNTER — Telehealth (HOSPITAL_COMMUNITY): Payer: Self-pay | Admitting: Specialist

## 2018-12-24 ENCOUNTER — Encounter (HOSPITAL_COMMUNITY): Payer: Self-pay | Admitting: General Surgery

## 2018-12-24 ENCOUNTER — Inpatient Hospital Stay (HOSPITAL_COMMUNITY): Payer: PRIVATE HEALTH INSURANCE

## 2018-12-24 NOTE — Telephone Encounter (Signed)
Pt called to say he missed a phone call - advise pt to call the office for Chemo Teaching that he missed his apptment this morning.

## 2018-12-24 NOTE — Telephone Encounter (Signed)
Pt called back he states waiting to June 1 will be good for him, he is not sure how he will do with Chemo treatments yet.

## 2018-12-27 ENCOUNTER — Inpatient Hospital Stay (HOSPITAL_COMMUNITY): Payer: PRIVATE HEALTH INSURANCE

## 2018-12-27 ENCOUNTER — Ambulatory Visit (HOSPITAL_COMMUNITY)
Admission: RE | Admit: 2018-12-27 | Discharge: 2018-12-27 | Disposition: A | Discharge status: Home / Self Care | Payer: PRIVATE HEALTH INSURANCE | Source: Ambulatory Visit | Attending: Hematology | Admitting: Hematology

## 2018-12-27 ENCOUNTER — Other Ambulatory Visit: Payer: Self-pay

## 2018-12-27 DIAGNOSIS — Z5111 Encounter for antineoplastic chemotherapy: Secondary | ICD-10-CM | POA: Diagnosis not present

## 2018-12-27 DIAGNOSIS — C259 Malignant neoplasm of pancreas, unspecified: Secondary | ICD-10-CM | POA: Diagnosis present

## 2018-12-27 DIAGNOSIS — Z95828 Presence of other vascular implants and grafts: Secondary | ICD-10-CM

## 2018-12-27 LAB — CBC WITH DIFFERENTIAL/PLATELET
Abs Immature Granulocytes: 0.01 10*3/uL (ref 0.00–0.07)
Basophils Absolute: 0 10*3/uL (ref 0.0–0.1)
Basophils Relative: 1 %
Eosinophils Absolute: 0.3 10*3/uL (ref 0.0–0.5)
Eosinophils Relative: 6 %
HCT: 43.9 % (ref 39.0–52.0)
Hemoglobin: 15 g/dL (ref 13.0–17.0)
Immature Granulocytes: 0 %
Lymphocytes Relative: 34 %
Lymphs Abs: 1.9 10*3/uL (ref 0.7–4.0)
MCH: 31.9 pg (ref 26.0–34.0)
MCHC: 34.2 g/dL (ref 30.0–36.0)
MCV: 93.4 fL (ref 80.0–100.0)
Monocytes Absolute: 0.4 10*3/uL (ref 0.1–1.0)
Monocytes Relative: 8 %
Neutro Abs: 2.9 10*3/uL (ref 1.7–7.7)
Neutrophils Relative %: 51 %
Platelets: 200 10*3/uL (ref 150–400)
RBC: 4.7 MIL/uL (ref 4.22–5.81)
RDW: 13.8 % (ref 11.5–15.5)
WBC: 5.7 10*3/uL (ref 4.0–10.5)
nRBC: 0 % (ref 0.0–0.2)

## 2018-12-27 LAB — COMPREHENSIVE METABOLIC PANEL
ALT: 25 U/L (ref 0–44)
AST: 17 U/L (ref 15–41)
Albumin: 4.3 g/dL (ref 3.5–5.0)
Alkaline Phosphatase: 67 U/L (ref 38–126)
Anion gap: 8 (ref 5–15)
BUN: 18 mg/dL (ref 8–23)
CO2: 27 mmol/L (ref 22–32)
Calcium: 9.3 mg/dL (ref 8.9–10.3)
Chloride: 101 mmol/L (ref 98–111)
Creatinine, Ser: 0.6 mg/dL — ABNORMAL LOW (ref 0.61–1.24)
GFR calc Af Amer: >60 mL/min (ref >60)
GFR calc non Af Amer: >60 mL/min (ref >60)
Glucose, Bld: 337 mg/dL — ABNORMAL HIGH (ref 70–99)
Potassium: 4.2 mmol/L (ref 3.5–5.1)
Sodium: 136 mmol/L (ref 135–145)
Total Bilirubin: 0.7 mg/dL (ref 0.3–1.2)
Total Protein: 7.2 g/dL (ref 6.5–8.1)

## 2018-12-27 MED ORDER — IOHEXOL 300 MG/ML  SOLN
100.0000 mL | Freq: Once | INTRAMUSCULAR | Status: AC | PRN
  Administered 2018-12-27: 100 mL via INTRAVENOUS

## 2018-12-30 ENCOUNTER — Encounter (HOSPITAL_COMMUNITY): Payer: Self-pay | Admitting: Hematology

## 2018-12-30 ENCOUNTER — Other Ambulatory Visit: Payer: Self-pay

## 2018-12-30 ENCOUNTER — Inpatient Hospital Stay (HOSPITAL_BASED_OUTPATIENT_CLINIC_OR_DEPARTMENT_OTHER): Payer: PRIVATE HEALTH INSURANCE | Admitting: Hematology

## 2018-12-30 ENCOUNTER — Inpatient Hospital Stay (HOSPITAL_COMMUNITY): Payer: PRIVATE HEALTH INSURANCE

## 2018-12-30 DIAGNOSIS — Z95828 Presence of other vascular implants and grafts: Secondary | ICD-10-CM

## 2018-12-30 DIAGNOSIS — C259 Malignant neoplasm of pancreas, unspecified: Secondary | ICD-10-CM

## 2018-12-30 DIAGNOSIS — Z5111 Encounter for antineoplastic chemotherapy: Secondary | ICD-10-CM | POA: Diagnosis not present

## 2018-12-30 DIAGNOSIS — C251 Malignant neoplasm of body of pancreas: Secondary | ICD-10-CM

## 2018-12-30 MED ORDER — DEXTROSE 5 % IV SOLN: Freq: Once | INTRAVENOUS | Status: DC

## 2018-12-30 MED ORDER — SODIUM CHLORIDE 0.9% FLUSH
10.0000 mL | INTRAVENOUS | Status: DC | PRN
  Administered 2018-12-30: 10 mL
  Filled 2018-12-30: qty 10

## 2018-12-30 MED ORDER — SODIUM CHLORIDE 0.9 % IV SOLN
2400.0000 mg/m2 | INTRAVENOUS | Status: AC
  Administered 2018-12-30: 16:00:00 4450 mg via INTRAVENOUS
  Filled 2018-12-30: qty 89

## 2018-12-30 MED ORDER — DEXTROSE 5 % IV SOLN
Freq: Once | INTRAVENOUS | Status: AC
  Administered 2018-12-30: 09:00:00 via INTRAVENOUS

## 2018-12-30 MED ORDER — OXALIPLATIN CHEMO INJECTION 100 MG/20ML
63.7500 mg/m2 | Freq: Once | INTRAVENOUS | Status: AC
  Administered 2018-12-30: 120 mg via INTRAVENOUS
  Filled 2018-12-30: qty 20

## 2018-12-30 MED ORDER — SODIUM CHLORIDE 0.9 % IV SOLN
INTRAVENOUS | Status: AC
  Administered 2018-12-30: 14:00:00 via INTRAVENOUS

## 2018-12-30 MED ORDER — SODIUM CHLORIDE 0.9 % IV SOLN
Freq: Once | INTRAVENOUS | Status: AC
  Administered 2018-12-30: 10:00:00 via INTRAVENOUS
  Filled 2018-12-30: qty 5

## 2018-12-30 MED ORDER — SODIUM CHLORIDE 0.9 % IV SOLN
700.0000 mg | Freq: Once | INTRAVENOUS | Status: AC
  Administered 2018-12-30: 700 mg via INTRAVENOUS
  Filled 2018-12-30: qty 35

## 2018-12-30 MED ORDER — PALONOSETRON HCL INJECTION 0.25 MG/5ML
0.2500 mg | Freq: Once | INTRAVENOUS | Status: AC
  Administered 2018-12-30: 10:00:00 0.25 mg via INTRAVENOUS
  Filled 2018-12-30: qty 5

## 2018-12-30 MED ORDER — ATROPINE SULFATE 1 MG/ML IJ SOLN
0.5000 mg | Freq: Once | INTRAMUSCULAR | Status: AC | PRN
  Administered 2018-12-30: 0.5 mg via INTRAVENOUS
  Filled 2018-12-30: qty 1

## 2018-12-30 MED ORDER — SODIUM CHLORIDE 0.9 % IV SOLN
150.0000 mg/m2 | Freq: Once | INTRAVENOUS | Status: AC
  Administered 2018-12-30: 280 mg via INTRAVENOUS
  Filled 2018-12-30: qty 4

## 2018-12-30 NOTE — Assessment & Plan Note (Signed)
1.  Stage IIb (T2N1) pancreatic adenocarcinoma: -Status post distal pancreatectomy and splenectomy at [facility] by Dr. Zenia Resides on 11/20/2018. - Genetic testing shows BRCA1 heterozygous VUS - As he did not receive any neoadjuvant chemotherapy, I have recommended 6 months of adjuvant chemotherapy. -We reviewed the 2 common regimens including modified FOLFIRINOX, and the gemcitabine/gemcitabine. -After thorough discussion of risks and benefits of each regimen, he has opted to try modified FOLFIRINOX. -He has a port placed.  He will proceed with his cycle 1 today.  I will cut back on the dose of oxaliplatin by 25% as he has questionable signs of neuropathy in the feet.  He denies any tingling or numbness burning type of pain. -I reviewed results of the CT CAP from 12/27/2018 which shows postoperative changes of distal pancreatectomy and splenectomy with postoperative fluid collection in the body of the pancreas adjacent to the resection line, measuring 3 x 3.4 cm.  This is likely postoperative pseudocyst.  No evidence of metastatic disease. -CA 19-9 has also improved to 20. - I will reevaluate him in 1 week.  2.  Postsplenectomy state: -He did receive vaccinations prior to splenectomy. -We will follow-up on booster  schedule.  3.  Diabetes: -This is not well controlled.  His most recent blood sugar was 350. - I reviewed his medication.  He is on Basaglar 24 units daily.  I will increase it to 27 units daily. -I have also told him to make an appointment to see Dr. Dorris Fetch as soon as possible.

## 2018-12-30 NOTE — Patient Instructions (Signed)
[facility] Discharge Instructions for Patients Receiving Chemotherapy   Beginning January 23rd 2017 lab work for the Cancer Center will be done in the  Main lab at [facility] on 1st floor. If you have a lab appointment with the Cancer Center please come in thru the  Main Entrance and check in at the main information desk   Today you received the following chemotherapy agents Oxaliplatin,Irinotecan,Leucovorin and 5FU. Follow-up as scheduled. Call clinic for any questions or concerns  To help prevent nausea and vomiting after your treatment, we encourage you to take your nausea medication   If you develop nausea and vomiting, or diarrhea that is not controlled by your medication, call the clinic.  The clinic phone number is [phone]. Office hours are Monday-Friday 8:30am-5:00pm.  BELOW ARE SYMPTOMS THAT SHOULD BE REPORTED IMMEDIATELY:  *FEVER GREATER THAN 101.0 F  *CHILLS WITH OR WITHOUT FEVER  NAUSEA AND VOMITING THAT IS NOT CONTROLLED WITH YOUR NAUSEA MEDICATION  *UNUSUAL SHORTNESS OF BREATH  *UNUSUAL BRUISING OR BLEEDING  TENDERNESS IN MOUTH AND THROAT WITH OR WITHOUT PRESENCE OF ULCERS  *URINARY PROBLEMS  *BOWEL PROBLEMS  UNUSUAL RASH Items with * indicate a potential emergency and should be followed up as soon as possible. If you have an emergency after office hours please contact your primary care physician or go to the nearest emergency department.  Please call the clinic during office hours if you have any questions or concerns.   You may also contact the Patient Navigator at [phone] should you have any questions or need assistance in obtaining follow up care.      Resources For Cancer Patients and their Caregivers ? American Cancer Society: Can assist with transportation, wigs, general needs, runs Look Good Feel Better.        1-888-227-6333 ? Cancer Care: Provides financial assistance, online support groups, medication/co-pay  assistance.  1-800-813-HOPE (4673) ? [facility] Assists [place] cancer patients and their families through emotional , educational and financial support.  336-427-4357 ? [place] DSS Where to apply for food stamps, Medicaid and utility assistance. 336-342-1394 ? [facility]: Transportation to medical appointments. 336-347-2287 ? Social Security Administration: May apply for disability if have a Stage IV cancer. 336-342-7796 1-800-772-1213 ? [place] Aging, Disability and Transit Services: Assists with nutrition, care and transit needs. 336-349-2343         

## 2018-12-30 NOTE — Progress Notes (Unsigned)
5615 Labs from 12/27/18 and reviewed with and pt seen by Dr. Delton Coombes and pt approved for chemo tx today per MD.CXR after port placement reviewed prior to accessing portacath as well                                                                                     1400 Drug specific information reviewed with and given to pt to take home as well as Chemotherapy spill kit and pump instructions.Permit signed Pt viewed the video on the infusystem pump with all questions answered.Pt verbalized understanding of all information given.

## 2018-12-30 NOTE — Progress Notes (Signed)
[name], [name] [address]   CLINIC:  Medical Oncology/Hematology  PCP:  Doree Albee, MD [name] [address] [phone]   REASON FOR VISIT:  Follow-up for pancreatic cancer   BRIEF ONCOLOGIC HISTORY:    Pancreatic adenocarcinoma ([place])   08/20/2018 Imaging    CT abdomen/pelvis w/ contrast: IMPRESSION: Atrophy and ductal dilatation involving the pancreatic tail, with suspected small soft tissue mass in the pancreatic body which could represent pancreatic carcinoma. Abdomen MRI and MRCP without and with contrast is recommended for further evaluation.  No evidence of hepatobiliary disease.    08/30/2018 Imaging    MRI abdomen w/ contrast: IMPRESSION: 1. Although not definitive, there remains concern of a small hypoenhancing mass at the junction of the pancreatic body and tail associated with atrophy and ductal dilatation in the pancreatic tail. This remains concerning for pancreatic neoplasm. Postinflammatory stricture less likely. Besides a tiny cystic lesion in the pancreatic tail, there are no other signs of previous pancreatitis. Further evaluation with endoscopic ultrasound for possible biopsy strongly recommended. 2. No evidence of metastatic disease. 3. Cholelithiasis without evidence of cholecystitis or biliary dilatation.    09/03/2018 Initial Diagnosis    Pancreatic adenocarcinoma ([place])    10/03/2018 Procedure    EUS: - 2.6cm irregularly shaped mass in the body of the pancreas that causing main pancreatic duct obstruction and dilation. The mass abuts the splenic vessels but no other significant vascular structures and it was sampled with trangastric EUS FNA. Preliminary cytology is + for malignancy, likely well-differentiated adenocarcinoma. It appears surgically resectable.    10/03/2018 Pathology Results    Accession: [id number]  FINE NEEDLE ASPIRATION, ENDOSCOPIC, PANCREAS BODY (SPECIMEN 1  OF 1 COLLECTED 10/03/18): ADENOCARCINOMA.    10/17/2018 Imaging    PET: IMPRESSION: 1. Tiny focus of hypermetabolism identified in the body of pancreas, adjacent to the abrupt cut off of the main pancreatic duct. No evidence for hypermetabolic metastatic disease in the neck, chest, abdomen, or pelvis. 2. Cholelithiasis. 3.  Aortic Atherosclerois (ICD10-170.0) 4. Prostatomegaly    11/12/2018 Genetic Testing    BRCA1 VUS identified on the common hereditary cancer panel.  The Hereditary Gene Panel offered by [facility] includes sequencing and/or deletion duplication testing of the following 47 genes: APC, ATM, AXIN2, BARD1, BMPR1A, BRCA1, BRCA2, BRIP1, CDH1, CDK4, CDKN2A (p14ARF), CDKN2A (p16INK4a), CHEK2, CTNNA1, DICER1, EPCAM (Deletion/duplication testing only), GREM1 (promoter region deletion/duplication testing only), KIT, MEN1, MLH1, MSH2, MSH3, MSH6, MUTYH, NBN, NF1, NHTL1, PALB2, PDGFRA, PMS2, POLD1, POLE, PTEN, RAD50, RAD51C, RAD51D, SDHB, SDHC, SDHD, SMAD4, SMARCA4. STK11, TP53, TSC1, TSC2, and VHL.  The following genes were evaluated for sequence changes only: SDHA and HOXB13 c.251G>A variant only. The report date is November 12, 2018.    11/20/2018 Surgery    Distal subtotal pancreatectomy with splenectomy at [facility]    11/20/2018 Surgery    TUMOR   Tumor Site:  Pancreatic body    Histologic Type:  Ductal adenocarcinoma    Histologic Grade:  G1: Well differentiated    Tumor Size:  Greatest dimension in Centimeters (cm): 2.8 Centimeters (cm)    Additional Dimension in Centimeters (cm):  2.7 Centimeters (cm)    Additional Dimension in Centimeters (cm):  1.8 Centimeters (cm)   Tumor Extent:      Tumor Extension:  Tumor is confined to pancreas    Accessory Findings:      Treatment Effect:  No known presurgical therapy     Lymphovascular Invasion:  Not identified     Perineural Invasion:  Not identified   MARGINS   Margins:      Proximal  Pancreatic Parenchymal Margin:  Uninvolved by invasive carcinoma and pancreatic high-grade intraepithelial neoplasia      Distance of Invasive Carcinoma from Margin:  0.6 Centimeters (cm)   :      Other Margin:  Splenic margin.      Margin Status:  Uninvolved by invasive carcinoma   LYMPH NODES  Number of Lymph Nodes Involved:  2   Number of Lymph Nodes Examined:  16   PATHOLOGIC STAGE CLASSIFICATION (pTNM, AJCC 8th Edition)  TNM Descriptors:  Not applicable   Primary Tumor (pT):  pT2   Regional Lymph Nodes (pN):  pN1   ADDITIONAL FINDINGS  Additional Pathologic Findings:  Pancreatic intraepithelial neoplasia    Highest Grade (PanIN):  High grade PanIN is present.   Additional Pathologic Findings:  Benign unilocular pancreatic cyst (1.3 cm in greatest dimension) at the pancreatic tail.    12/30/2018 -  Chemotherapy    The patient had palonosetron (ALOXI) injection 0.25 mg, 0.25 mg, Intravenous,  Once, 1 of 4 cycles Administration: 0.25 mg (12/30/2018) pegfilgrastim (NEULASTA) injection 6 mg, 6 mg, Subcutaneous, Once, 1 of 4 cycles irinotecan (CAMPTOSAR) 280 mg in sodium chloride 0.9 % 500 mL chemo infusion, 150 mg/m2 = 280 mg (100 % of original dose 150 mg/m2), Intravenous,  Once, 1 of 4 cycles Dose modification: 150 mg/m2 (original dose 150 mg/m2, Cycle 1, Reason: Provider Judgment) leucovorin 700 mg in sodium chloride 0.9 % 250 mL infusion, 744 mg, Intravenous,  Once, 1 of 4 cycles oxaliplatin (ELOXATIN) 120 mg in dextrose 5 % 500 mL chemo infusion, 63.75 mg/m2 = 120 mg (75 % of original dose 85 mg/m2), Intravenous,  Once, 1 of 4 cycles Dose modification: 63.75 mg/m2 (75 % of original dose 85 mg/m2, Cycle 1, Reason: Other (see comments), Comment: high risk for worsening neuropathy) Administration: 120 mg (12/30/2018) fosaprepitant (EMEND) 150 mg, dexamethasone (DECADRON) 12 mg in sodium chloride 0.9 % 145 mL IVPB, , Intravenous,  Once, 1 of 4 cycles  Administration:  (12/30/2018) fluorouracil (ADRUCIL) 4,450 mg in sodium chloride 0.9 % 61 mL chemo infusion, 2,400 mg/m2 = 4,450 mg, Intravenous, 1 Day/Dose, 1 of 4 cycles  for chemotherapy treatment.       CANCER STAGING: Cancer Staging No matching staging information was found for the patient.   INTERVAL HISTORY:  [name] 63 y.o. male returns for routine follow-up and start chemotherapy. He states that he had his port placed on exam port incision is healing well, no redness or swelling noted. He was educated on the importance of keeping his bloods sugars down during treatment. Denies any nausea, vomiting, or diarrhea. Denies any new pains. Had not noticed any recent bleeding such as epistaxis, hematuria or hematochezia. Denies recent chest pain on exertion, shortness of breath on minimal exertion, pre-syncopal episodes, or palpitations. Denies any numbness or tingling in hands or feet. Denies any recent fevers, infections, or recent hospitalizations. Patient reports appetite at 75% and energy level at 25%.    REVIEW OF SYSTEMS:  Review of Systems  All other systems reviewed and are negative.    PAST MEDICAL/SURGICAL HISTORY:  Past Medical History:  Diagnosis Date  . Cervical compression fracture ([place])   . Cervical spinal stenosis   . Diabetic neuropathy (H[facility])    Bilateral legs  . Diverticulitis   . Family history of pancreatic cancer   . History of  kidney stones   . Hypothyroidism   . Pancreatic cancer ([place])   . Stenosis of left vertebral artery   . Stroke [facility])    2011  . Type 2 diabetes mellitus ([place])    Past Surgical History:  Procedure Laterality Date  . APPENDECTOMY    . COLON RESECTION     For diverticulitis  . COLON SURGERY    . ESOPHAGOGASTRODUODENOSCOPY N/A 10/03/2018   Procedure: ESOPHAGOGASTRODUODENOSCOPY (EGD);  Surgeon: [name], MD;  Location: [name];  Service: Endoscopy;  Laterality: N/A;  . EUS N/A 10/03/2018   Procedure: UPPER  ENDOSCOPIC ULTRASOUND (EUS) RADIAL;  Surgeon: [name], MD;  Location: [facility];  Service: Endoscopy;  Laterality: N/A;  . FINE NEEDLE ASPIRATION N/A 10/03/2018   Procedure: FINE NEEDLE ASPIRATION (FNA) LINEAR;  Surgeon: [name], MD;  Location: [facility];  Service: Endoscopy;  Laterality: N/A;  . IR ANGIO INTRA EXTRACRAN SEL COM CAROTID INNOMINATE BILAT MOD SED  01/21/2018  . IR ANGIO INTRA EXTRACRAN SEL COM CAROTID INNOMINATE UNI R MOD SED  03/21/2018  . IR ANGIO VERTEBRAL SEL VERTEBRAL BILAT MOD SED  01/21/2018  . IR TRANSCATH EXCRAN VERT OR CAR A STENT  03/21/2018  . PORTACATH PLACEMENT Left 12/23/2018   Procedure: INSERTION PORT-A-CATH;  Surgeon: Virl Cagey, MD;  Location: [facility];  Service: General;  Laterality: Left;  . RADIOLOGY WITH ANESTHESIA N/A 03/21/2018   Procedure: IR WITH ANESTHESIA WITH STENT PLACEMENT;  Surgeon: Luanne Bras, MD;  Location: [facility];  Service: Radiology;  Laterality: N/A;  . ROTATOR CUFF REPAIR     Left     SOCIAL HISTORY:  Social History   Socioeconomic History  . Marital status: Single    Spouse name: Not on file  . Number of children: 2  . Years of education: Not on file  . Highest education level: Not on file  Occupational History  . Occupation: Estate agent  Social Needs  . Financial resource strain: Somewhat hard  . Food insecurity:    Worry: Never true    Inability: Never true  . Transportation needs:    Medical: No    Non-medical: No  Tobacco Use  . Smoking status: Never Smoker  . Smokeless tobacco: Never Used  Substance and Sexual Activity  . Alcohol use: Not Currently  . Drug use: Never  . Sexual activity: Not on file  Lifestyle  . Physical activity:    Days per week: 0 days    Minutes per session: 0 min  . Stress: To some extent  Relationships  . Social connections:    Talks on phone: More than three times a week    Gets together: Once a week    Attends religious service: More than 4 times  per year    Active member of club or organization: No    Attends meetings of clubs or organizations: Never    Relationship status: Divorced  . Intimate partner violence:    Fear of current or ex partner: No    Emotionally abused: No    Physically abused: No    Forced sexual activity: No  Other Topics Concern  . Not on file  Social History Narrative  . Not on file    FAMILY HISTORY:  Family History  Problem Relation Age of Onset  . Stroke Mother   . Pancreatic cancer Father 59       d. 95  . Stroke Maternal Grandmother   . Heart attack Maternal  Grandfather   . Heart Problems Paternal Grandfather   . Scoliosis Daughter   . Muscular dystrophy Grandson     CURRENT MEDICATIONS:  Outpatient Encounter Medications as of 12/30/2018  Medication Sig Note  . aspirin 81 MG tablet Take 1 tablet (81 mg total) by mouth daily.   Marland Kitchen atorvastatin (LIPITOR) 80 MG tablet Take 1 tablet (80 mg total) by mouth daily at 6 PM. (Patient taking differently: Take 80 mg by mouth daily. )   . Continuous Blood Gluc Sensor (FREESTYLE LIBRE 14 DAY SENSOR) MISC 1 Device by Does not apply route every 14 (fourteen) days.   Marland Kitchen dextrose 5 % SOLN 1,000 mL with fluorouracil 5 GM/100ML SOLN Inject into the vein over 48 hr.   . ECHINACEA EXTRACT PO Take 2 tablets by mouth daily.    Marland Kitchen FLUOROURACIL IV Inject into the vein every 14 (fourteen) days.   Marland Kitchen gabapentin (NEURONTIN) 300 MG capsule Take 1 capsule (300 mg total) by mouth 2 (two) times daily.   Marland Kitchen glipiZIDE (GLUCOTROL) 10 MG tablet Take 10 mg by mouth 2 (two) times daily as needed (blood sugar over 240).    . Insulin Glargine (BASAGLAR KWIKPEN) 100 UNIT/ML SOPN Inject 24 Units into the skin daily.  12/23/2018: Pt took 12 units this AM.  . Insulin Pen Needle 32G X 4 MM MISC 1 Device by Does not apply route daily.   . IRINOTECAN HCL IV Inject into the vein every 14 (fourteen) days.   Marland Kitchen LEUCOVORIN CALCIUM IV Inject into the vein every 14 (fourteen) days.   Marland Kitchen  lidocaine-prilocaine (EMLA) cream Apply pea-sized amount to port-a-cath site and cover with plastic wrap one hour prior to chemotherapy appointments   . loperamide (IMODIUM A-D) 2 MG tablet Take 1 tablet (2 mg total) by mouth as needed. Take 2 at diarrhea onset , then 1 every 2hr until 12hrs with no BM. May take 2 every 4hrs at night. If diarrhea recurs repeat.   . NP THYROID 30 MG tablet Take 30 mg by mouth daily before breakfast.    . Omega-3 1000 MG CAPS Take 1,000 mg by mouth daily.   . OXALIPLATIN IV Inject into the vein every 14 (fourteen) days.   . pantoprazole (PROTONIX) 20 MG tablet Take 20 mg by mouth daily.    . prochlorperazine (COMPAZINE) 10 MG tablet Take 1 tablet (10 mg total) by mouth every 6 (six) hours as needed (NAUSEA).   Marland Kitchen sertraline (ZOLOFT) 50 MG tablet Take 50 mg by mouth every morning.    . tamsulosin (FLOMAX) 0.4 MG CAPS capsule Take 0.4 mg by mouth daily.    . ticagrelor (BRILINTA) 90 MG TABS tablet Take 1 tablet (90 mg total) by mouth 2 (two) times daily.   . traMADol (ULTRAM) 50 MG tablet Take 1 tablet (50 mg total) by mouth every 8 (eight) hours as needed for up to 30 days.   . vitamin B-12 (CYANOCOBALAMIN) 1000 MCG tablet Take 1,000 mcg by mouth daily.   . [DISCONTINUED] insulin degludec (TRESIBA FLEXTOUCH) 100 UNIT/ML SOPN FlexTouch Pen Inject 0.2 mLs (20 Units total) into the skin daily.   . [DISCONTINUED] ticagrelor (BRILINTA) 90 MG TABS tablet Take 90 mg by mouth 2 (two) times daily.    No facility-administered encounter medications on file as of 12/30/2018.     ALLERGIES:  Allergies  Allergen Reactions  . Demerol [Meperidine Hcl] Other (See Comments)    convulsions     PHYSICAL EXAM:  ECOG Performance status: 1  Vitals:   12/30/18 0815  BP: 120/71  Pulse: 79  Resp: 18  Temp: (!) 97.5 F (36.4 C)  SpO2: 100%   Filed Weights   12/30/18 0815  Weight: 154 lb 6.4 oz (70 kg)    Physical Exam Vitals signs reviewed.  Constitutional:       Appearance: Normal appearance.  Cardiovascular:     Rate and Rhythm: Normal rate and regular rhythm.     Heart sounds: Normal heart sounds.  Pulmonary:     Effort: Pulmonary effort is normal.     Breath sounds: Normal breath sounds.  Abdominal:     General: There is no distension.     Palpations: Abdomen is soft. There is no mass.  Musculoskeletal:        General: No swelling.  Skin:    General: Skin is warm.  Neurological:     Mental Status: He is alert and oriented to person, place, and time.  Psychiatric:        Mood and Affect: Mood normal.        Behavior: Behavior normal.      LABORATORY DATA:  I have reviewed the labs as listed.  CBC    Component Value Date/Time   WBC 5.7 12/27/2018 1050   RBC 4.70 12/27/2018 1050   HGB 15.0 12/27/2018 1050   HGB 14.9 12/11/2018 1018   HCT 43.9 12/27/2018 1050   PLT 200 12/27/2018 1050   PLT 335 12/11/2018 1018   MCV 93.4 12/27/2018 1050   MCH 31.9 12/27/2018 1050   MCHC 34.2 12/27/2018 1050   RDW 13.8 12/27/2018 1050   LYMPHSABS 1.9 12/27/2018 1050   MONOABS 0.4 12/27/2018 1050   EOSABS 0.3 12/27/2018 1050   BASOSABS 0.0 12/27/2018 1050   CMP Latest Ref Rng & Units 12/27/2018 12/11/2018 11/13/2018  Glucose 70 - 99 mg/dL 337(H) 377(H) 219(H)  BUN 8 - 23 mg/dL 18 32(H) 10  Creatinine 0.61 - 1.24 mg/dL 0.60(L) 0.88 0.82  Sodium 135 - 145 mmol/L 136 138 137  Potassium 3.5 - 5.1 mmol/L 4.2 4.4 4.0  Chloride 98 - 111 mmol/L 101 101 106  CO2 22 - 32 mmol/L _0 Calcium 8.9 - 10.3 mg/dL 9.3 9.4 8.8(L)  Total Protein 6.5 - 8.1 g/dL 7.2 7.4 6.6  Total Bilirubin 0.3 - 1.2 mg/dL 0.7 0.8 1.9(H)  Alkaline Phos 38 - 126 U/L 67 74 69  AST 15 - 41 U/L 17 16 11(L)  ALT 0 - 44 U/L _1 DIAGNOSTIC IMAGING:  I have independently reviewed the scans and discussed with the patient.   I have reviewed Venita Lick LPN's note and agree with the documentation.  I personally performed a face-to-face visit, made revisions and  my assessment and plan is as follows.    ASSESSMENT & PLAN:   Pancreatic adenocarcinoma (HCC) 1.  Stage IIb (T2N1) pancreatic adenocarcinoma: -Status post distal pancreatectomy and splenectomy at [facility] by Dr. Zenia Resides on 11/20/2018. - Genetic testing shows BRCA1 heterozygous VUS - As he did not receive any neoadjuvant chemotherapy, I have recommended 6 months of adjuvant chemotherapy. -We reviewed the 2 common regimens including modified FOLFIRINOX, and the gemcitabine/gemcitabine. -After thorough discussion of risks and benefits of each regimen, he has opted to try modified FOLFIRINOX. -He has a port placed.  He will proceed with his cycle 1 today.  I will cut back on the dose of oxaliplatin by 25% as he has questionable signs of  neuropathy in the feet.  He denies any tingling or numbness burning type of pain. -I reviewed results of the CT CAP from 12/27/2018 which shows postoperative changes of distal pancreatectomy and splenectomy with postoperative fluid collection in the body of the pancreas adjacent to the resection line, measuring 3 x 3.4 cm.  This is likely postoperative pseudocyst.  No evidence of metastatic disease. -CA 19-9 has also improved to 20. - I will reevaluate him in 1 week.  2.  Postsplenectomy state: -He did receive vaccinations prior to splenectomy. -We will follow-up on booster  schedule.  3.  Diabetes: -This is not well controlled.  His most recent blood sugar was 350. - I reviewed his medication.  He is on Basaglar 24 units daily.  I will increase it to 27 units daily. -I have also told him to make an appointment to see Dr. Dorris Fetch as soon as possible.  Total time spent is 45 minutes with more than 50% of the time spent face-to-face discussing treatment options, adverse effects, management and coordination of care.    Orders placed this encounter:  No orders of the defined types were placed in this encounter.     Derek Jack, MD [place]  [phone]

## 2018-12-30 NOTE — Patient Instructions (Signed)
[place] at [facility] Discharge Instructions  You were seen today by Dr. Delton Coombes. He went over your recent lab results. He would like you to start taking 27 units of your insulin to help lower your blood sugar. Avoid drinking and touching anything cold for the next 3-4 days. He will see you back in 1 week for labs and follow up.   Thank you for choosing [place] at [facility] to provide your oncology and hematology care.  To afford each patient quality time with our provider, please arrive at least 15 minutes before your scheduled appointment time.   If you have a lab appointment with the [place] please come in thru the  Main Entrance and check in at the main information desk  You need to re-schedule your appointment should you arrive 10 or more minutes late.  We strive to give you quality time with our providers, and arriving late affects you and other patients whose appointments are after yours.  Also, if you no show three or more times for appointments you may be dismissed from the clinic at the providers discretion.     Again, thank you for choosing [facility].  Our hope is that these requests will decrease the amount of time that you wait before being seen by our physicians.       _____________________________________________________________  Should you have questions after your visit to [facility] please contact our office at [phone] between the hours of 8:00 a.m. and 4:30 p.m.  Voicemails left after 4:00 p.m. will not be returned until the following business day.  For prescription refill requests, have your pharmacy contact our office and allow 72 hours.    Cancer Center Support Programs:   > Cancer Support Group  2nd Tuesday of the month 1pm-2pm, Journey Room

## 2019-01-01 ENCOUNTER — Inpatient Hospital Stay (HOSPITAL_COMMUNITY): Payer: PRIVATE HEALTH INSURANCE

## 2019-01-01 ENCOUNTER — Other Ambulatory Visit: Payer: Self-pay

## 2019-01-01 ENCOUNTER — Encounter (HOSPITAL_COMMUNITY): Payer: Self-pay

## 2019-01-01 VITALS — BP 117/66 | HR 86 | Temp 97.8°F | Resp 18

## 2019-01-01 DIAGNOSIS — C259 Malignant neoplasm of pancreas, unspecified: Secondary | ICD-10-CM

## 2019-01-01 DIAGNOSIS — Z5111 Encounter for antineoplastic chemotherapy: Secondary | ICD-10-CM | POA: Diagnosis not present

## 2019-01-01 DIAGNOSIS — Z95828 Presence of other vascular implants and grafts: Secondary | ICD-10-CM

## 2019-01-01 MED ORDER — PEGFILGRASTIM INJECTION 6 MG/0.6ML ~~LOC~~
6.0000 mg | PREFILLED_SYRINGE | Freq: Once | SUBCUTANEOUS | Status: AC
  Administered 2019-01-01: 14:00:00 6 mg via SUBCUTANEOUS

## 2019-01-01 MED ORDER — HEPARIN SOD (PORK) LOCK FLUSH 100 UNIT/ML IV SOLN
500.0000 [IU] | Freq: Once | INTRAVENOUS | Status: AC | PRN
  Administered 2019-01-01: 500 [IU]

## 2019-01-01 MED ORDER — SODIUM CHLORIDE 0.9% FLUSH
10.0000 mL | INTRAVENOUS | Status: DC | PRN
  Administered 2019-01-01: 13:00:00 10 mL
  Filled 2019-01-01: qty 10

## 2019-01-01 MED ORDER — PEGFILGRASTIM INJECTION 6 MG/0.6ML ~~LOC~~
PREFILLED_SYRINGE | SUBCUTANEOUS | Status: AC
  Filled 2019-01-01: qty 0.6

## 2019-01-01 NOTE — Progress Notes (Signed)
Mark Foster tolerated 5FU pump well without complaints or incident. 5FU pump discontinued with portacath flushed easily per protocol then de-accessed. Pt also tolerated Neulasta injection well after purpose and side effects reviewed with pt and understanding verbalized. VSS Pt discharged self ambulatory using his cane in satisfactory condition

## 2019-01-01 NOTE — Patient Instructions (Signed)
[place] at [facility] Discharge Instructions  5FU pump discontinued with portacath flushed per protocol. Neulasta injection given as well. Follow-up as scheduled. Call clinic for any questions or concerns   Thank you for choosing [place] at [facility] to provide your oncology and hematology care.  To afford each patient quality time with our provider, please arrive at least 15 minutes before your scheduled appointment time.   If you have a lab appointment with the [place] please come in thru the  Main Entrance and check in at the main information desk  You need to re-schedule your appointment should you arrive 10 or more minutes late.  We strive to give you quality time with our providers, and arriving late affects you and other patients whose appointments are after yours.  Also, if you no show three or more times for appointments you may be dismissed from the clinic at the providers discretion.     Again, thank you for choosing [facility].  Our hope is that these requests will decrease the amount of time that you wait before being seen by our physicians.       _____________________________________________________________  Should you have questions after your visit to [facility], please contact our office at [phone] between the hours of 8:00 a.m. and 4:30 p.m.  Voicemails left after 4:00 p.m. will not be returned until the following business day.  For prescription refill requests, have your pharmacy contact our office and allow 72 hours.    Cancer Center Support Programs:   > Cancer Support Group  2nd Tuesday of the month 1pm-2pm, Journey Room

## 2019-01-02 ENCOUNTER — Ambulatory Visit (INDEPENDENT_AMBULATORY_CARE_PROVIDER_SITE_OTHER): Payer: PRIVATE HEALTH INSURANCE | Admitting: "Endocrinology

## 2019-01-02 ENCOUNTER — Telehealth (HOSPITAL_COMMUNITY): Payer: Self-pay | Admitting: *Deleted

## 2019-01-02 ENCOUNTER — Encounter: Payer: Self-pay | Admitting: "Endocrinology

## 2019-01-02 VITALS — BP 130/89 | HR 89 | Temp 98.3°F | Ht 70.0 in | Wt 154.0 lb

## 2019-01-02 DIAGNOSIS — E039 Hypothyroidism, unspecified: Secondary | ICD-10-CM

## 2019-01-02 DIAGNOSIS — I10 Essential (primary) hypertension: Secondary | ICD-10-CM

## 2019-01-02 DIAGNOSIS — E782 Mixed hyperlipidemia: Secondary | ICD-10-CM | POA: Diagnosis not present

## 2019-01-02 DIAGNOSIS — E1165 Type 2 diabetes mellitus with hyperglycemia: Secondary | ICD-10-CM

## 2019-01-02 NOTE — Progress Notes (Signed)
01/02/2019, 10:18 AM  Endocrinology follow-up note   Subjective:    Patient ID: Mark Foster, male    DOB: 1955-11-11.  Mark Foster is being seen in follow-up for management of currently uncontrolled symptomatic pancreatic diabetes, hypothyroidism, hyperlipidemia. PMD:  Doree Albee, MD.   Past Medical History:  Diagnosis Date  . Cervical compression fracture ([place])   . Cervical spinal stenosis   . Diabetic neuropathy ([facility])    Bilateral legs  . Diverticulitis   . Family history of pancreatic cancer   . History of kidney stones   . Hypothyroidism   . Pancreatic cancer ([place])   . Stenosis of left vertebral artery   . Stroke [facility])    2011  . Type 2 diabetes mellitus ([place])    Past Surgical History:  Procedure Laterality Date  . APPENDECTOMY    . COLON RESECTION     For diverticulitis  . COLON SURGERY    . ESOPHAGOGASTRODUODENOSCOPY N/A 10/03/2018   Procedure: ESOPHAGOGASTRODUODENOSCOPY (EGD);  Surgeon: [name], MD;  Location: [name];  Service: Endoscopy;  Laterality: N/A;  . EUS N/A 10/03/2018   Procedure: UPPER ENDOSCOPIC ULTRASOUND (EUS) RADIAL;  Surgeon: [name], MD;  Location: [facility];  Service: Endoscopy;  Laterality: N/A;  . FINE NEEDLE ASPIRATION N/A 10/03/2018   Procedure: FINE NEEDLE ASPIRATION (FNA) LINEAR;  Surgeon: [name], MD;  Location: [facility];  Service: Endoscopy;  Laterality: N/A;  . IR ANGIO INTRA EXTRACRAN SEL COM CAROTID INNOMINATE BILAT MOD SED  01/21/2018  . IR ANGIO INTRA EXTRACRAN SEL COM CAROTID INNOMINATE UNI R MOD SED  03/21/2018  . IR ANGIO VERTEBRAL SEL VERTEBRAL BILAT MOD SED  01/21/2018  . IR TRANSCATH EXCRAN VERT OR CAR A STENT  03/21/2018  . PORTACATH PLACEMENT Left 12/23/2018   Procedure: INSERTION PORT-A-CATH;  Surgeon: Virl Cagey, MD;  Location: [facility];  Service: General;  Laterality: Left;  . RADIOLOGY WITH  ANESTHESIA N/A 03/21/2018   Procedure: IR WITH ANESTHESIA WITH STENT PLACEMENT;  Surgeon: Luanne Bras, MD;  Location: [facility];  Service: Radiology;  Laterality: N/A;  . ROTATOR CUFF REPAIR     Left   Social History   Socioeconomic History  . Marital status: Single    Spouse name: Not on file  . Number of children: 2  . Years of education: Not on file  . Highest education level: Not on file  Occupational History  . Occupation: Estate agent  Social Needs  . Financial resource strain: Somewhat hard  . Food insecurity:    Worry: Never true    Inability: Never true  . Transportation needs:    Medical: No    Non-medical: No  Tobacco Use  . Smoking status: Never Smoker  . Smokeless tobacco: Never Used  Substance and Sexual Activity  . Alcohol use: Not Currently  . Drug use: Never  . Sexual activity: Not on file  Lifestyle  . Physical activity:    Days per week: 0 days    Minutes per session: 0 min  . Stress: To some extent  Relationships  . Social connections:  Talks on phone: More than three times a week    Gets together: Once a week    Attends religious service: More than 4 times per year    Active member of club or organization: No    Attends meetings of clubs or organizations: Never    Relationship status: Divorced  Other Topics Concern  . Not on file  Social History Narrative  . Not on file   Outpatient Encounter Medications as of 01/02/2019  Medication Sig  . aspirin 81 MG tablet Take 1 tablet (81 mg total) by mouth daily.  Marland Kitchen atorvastatin (LIPITOR) 80 MG tablet Take 1 tablet (80 mg total) by mouth daily at 6 PM. (Patient taking differently: Take 80 mg by mouth daily. )  . Continuous Blood Gluc Sensor (FREESTYLE LIBRE 14 DAY SENSOR) MISC 1 Device by Does not apply route every 14 (fourteen) days.  Marland Kitchen dextrose 5 % SOLN 1,000 mL with fluorouracil 5 GM/100ML SOLN Inject into the vein over 48 hr.  . ECHINACEA EXTRACT PO Take 2 tablets by mouth daily.   Marland Kitchen  FLUOROURACIL IV Inject into the vein every 14 (fourteen) days.  Marland Kitchen gabapentin (NEURONTIN) 300 MG capsule Take 1 capsule (300 mg total) by mouth 2 (two) times daily.  . Insulin Glargine (BASAGLAR KWIKPEN) 100 UNIT/ML SOPN Inject 24 Units into the skin daily.   . Insulin Pen Needle 32G X 4 MM MISC 1 Device by Does not apply route daily.  . IRINOTECAN HCL IV Inject into the vein every 14 (fourteen) days.  Marland Kitchen LEUCOVORIN CALCIUM IV Inject into the vein every 14 (fourteen) days.  Marland Kitchen lidocaine-prilocaine (EMLA) cream Apply pea-sized amount to port-a-cath site and cover with plastic wrap one hour prior to chemotherapy appointments  . loperamide (IMODIUM A-D) 2 MG tablet Take 1 tablet (2 mg total) by mouth as needed. Take 2 at diarrhea onset , then 1 every 2hr until 12hrs with no BM. May take 2 every 4hrs at night. If diarrhea recurs repeat.  . NP THYROID 30 MG tablet Take 30 mg by mouth daily before breakfast.   . Omega-3 1000 MG CAPS Take 1,000 mg by mouth daily.  . OXALIPLATIN IV Inject into the vein every 14 (fourteen) days.  . pantoprazole (PROTONIX) 20 MG tablet Take 20 mg by mouth daily.   . prochlorperazine (COMPAZINE) 10 MG tablet Take 1 tablet (10 mg total) by mouth every 6 (six) hours as needed (NAUSEA).  Marland Kitchen sertraline (ZOLOFT) 50 MG tablet Take 50 mg by mouth every morning.   . tamsulosin (FLOMAX) 0.4 MG CAPS capsule Take 0.4 mg by mouth daily.   . ticagrelor (BRILINTA) 90 MG TABS tablet Take 1 tablet (90 mg total) by mouth 2 (two) times daily.  . traMADol (ULTRAM) 50 MG tablet Take 1 tablet (50 mg total) by mouth every 8 (eight) hours as needed for up to 30 days.  . vitamin B-12 (CYANOCOBALAMIN) 1000 MCG tablet Take 1,000 mcg by mouth daily.  . [DISCONTINUED] glipiZIDE (GLUCOTROL) 10 MG tablet Take 10 mg by mouth 2 (two) times daily as needed (blood sugar over 240).    Facility-Administered Encounter Medications as of 01/02/2019  Medication  . 0.9 %  sodium chloride infusion  . 0.9 %  sodium  chloride infusion  . dextrose 5 % solution  . sodium chloride flush (NS) 0.9 % injection 10 mL    ALLERGIES: Allergies  Allergen Reactions  . Demerol [Meperidine Hcl] Other (See Comments)    convulsions    VACCINATION  STATUS: Immunization History  Administered Date(s) Administered  . HiB (PRP-T) 10/23/2018  . Influenza-Unspecified 08/02/2018  . Meningococcal Mcv4o 10/23/2018  . Pneumococcal Conjugate-13 10/23/2018    Diabetes  He presents for his follow-up diabetic visit. Onset time: He was diagnosed at approximate age of 13 years. His disease course has been worsening (He did have A1c of 9.7% in May 2019.  However, more recently he has documented controlled glycemic profile averaging 134 over the last 2 weeks.). There are no hypoglycemic associated symptoms. Pertinent negatives for hypoglycemia include no confusion, headaches, pallor or seizures. There are no diabetic associated symptoms. Pertinent negatives for diabetes include no chest pain, no fatigue, no polydipsia, no polyphagia, no polyuria and no weakness. There are no hypoglycemic complications. Symptoms are worsening. Diabetic complications include a CVA. Pertinent negatives for diabetic complications include no heart disease. (Awaiting stent placement in his carotid artery.) Risk factors for coronary artery disease include diabetes mellitus, dyslipidemia, hypertension, male sex and sedentary lifestyle. Current diabetic treatment includes insulin injections. His weight is fluctuating minimally. He is following a generally unhealthy diet. When asked about meal planning, he reported none. He has not had a previous visit with a dietitian. He rarely participates in exercise. His overall blood glucose range is >200 mg/dl. (After missing his appointments, returns with loss of control of diabetes with A1c of 9%.  Patient is diagnosed with pancreatic cancer in the interim, currently in the middle of chemotherapy.) An ACE inhibitor/angiotensin  II receptor blocker is not being taken.  Hyperlipidemia  This is a chronic problem. The current episode started more than 1 year ago. The problem is uncontrolled. Exacerbating diseases include diabetes. Pertinent negatives include no chest pain, myalgias or shortness of breath. Current antihyperlipidemic treatment includes statins. Risk factors for coronary artery disease include diabetes mellitus, dyslipidemia, hypertension, male sex and a sedentary lifestyle.    Review of Systems  Constitutional: Negative for chills, fatigue, fever and unexpected weight change.  HENT: Negative for dental problem, mouth sores and trouble swallowing.   Eyes: Negative for visual disturbance.  Respiratory: Negative for cough, choking, chest tightness, shortness of breath and wheezing.   Cardiovascular: Negative for chest pain, palpitations and leg swelling.  Gastrointestinal: Negative for abdominal distention, abdominal pain, constipation, diarrhea, nausea and vomiting.  Endocrine: Negative for polydipsia, polyphagia and polyuria.  Genitourinary: Negative for dysuria, flank pain, hematuria and urgency.  Musculoskeletal: Negative for back pain, gait problem, myalgias and neck pain.  Skin: Negative for pallor, rash and wound.  Neurological: Negative for seizures, syncope, weakness, numbness and headaches.  Psychiatric/Behavioral: Negative.  Negative for confusion and dysphoric mood.    Objective:    BP 130/89   Pulse 89   Temp 98.3 F (36.8 C)   Ht 5\' 10"  (1.778 m)   Wt 154 lb (69.9 kg)   BMI 22.10 kg/m   Wt Readings from Last 3 Encounters:  01/02/19 154 lb (69.9 kg)  12/30/18 154 lb 6.4 oz (70 kg)  12/18/18 154 lb 12.8 oz (70.2 kg)     Physical Exam Constitutional:      General: He is not in acute distress.    Appearance: He is well-developed.     Comments: Thin build.  HENT:     Head: Normocephalic and atraumatic.  Neck:     Musculoskeletal: Normal range of motion and neck supple.      Thyroid: No thyromegaly.     Trachea: No tracheal deviation.  Cardiovascular:     Rate and Rhythm: Normal rate.  Pulses:          Dorsalis pedis pulses are 1+ on the right side and 1+ on the left side.       Posterior tibial pulses are 1+ on the right side and 1+ on the left side.     Heart sounds: S1 normal and S2 normal. No murmur. No gallop.   Pulmonary:     Effort: Pulmonary effort is normal. No respiratory distress.     Breath sounds: No wheezing.  Abdominal:     General: Bowel sounds are normal. There is no distension.     Palpations: Abdomen is soft.     Tenderness: There is no abdominal tenderness. There is no guarding.  Musculoskeletal:     Right shoulder: He exhibits no swelling and no deformity.  Skin:    General: Skin is warm and dry.     Findings: No rash.     Nails: There is no clubbing.   Neurological:     Mental Status: He is alert and oriented to person, place, and time.     Cranial Nerves: No cranial nerve deficit.     Sensory: No sensory deficit.     Gait: Gait normal.     Deep Tendon Reflexes: Reflexes are normal and symmetric.  Psychiatric:        Speech: Speech normal.        Behavior: Behavior is cooperative.        Judgment: Judgment normal.     CMP ( most recent) CMP     Component Value Date/Time   NA 136 12/27/2018 1050   K 4.2 12/27/2018 1050   CL 101 12/27/2018 1050   CO2 27 12/27/2018 1050   GLUCOSE 337 (H) 12/27/2018 1050   BUN 18 12/27/2018 1050   CREATININE 0.60 (L) 12/27/2018 1050   CREATININE 0.88 12/11/2018 1018   CALCIUM 9.3 12/27/2018 1050   PROT 7.2 12/27/2018 1050   ALBUMIN 4.3 12/27/2018 1050   AST 17 12/27/2018 1050   AST 16 12/11/2018 1018   ALT 25 12/27/2018 1050   ALT 19 12/11/2018 1018   ALKPHOS 67 12/27/2018 1050   BILITOT 0.7 12/27/2018 1050   BILITOT 0.8 12/11/2018 1018   GFRNONAA >60 12/27/2018 1050   GFRNONAA >60 12/11/2018 1018   GFRAA >60 12/27/2018 1050   GFRAA >60 12/11/2018 1018     Diabetic Labs  (most recent): Lab Results  Component Value Date   HGBA1C 9.0 (H) 11/12/2018   HGBA1C 6.2 (H) 05/20/2018   HGBA1C 6.5 (H) 03/19/2018     Lipid Panel ( most recent) Lipid Panel     Component Value Date/Time   CHOL 168 01/20/2018 0247   TRIG 51 01/20/2018 0247   HDL 45 01/20/2018 0247   CHOLHDL 3.7 01/20/2018 0247   VLDL 10 01/20/2018 0247   LDLCALC 113 (H) 01/20/2018 0247    Assessment & Plan:   1. Uncontrolled type 2 diabetes mellitus with hyperglycemia ([place])  - Mark Foster has history of uncontrolled diabetes with A1c of 9.7% in May 2019.  He was able to control it down to 6.2%. -Unfortunately, more recently patient is diagnosed with pancreatic cancer currently in the middle of chemotherapy.  His A1c was seen to increase to 9%.  He is on Basaglar 20 units nightly and glipizide 10 mg p.o. twice daily.  -  Recent labs reviewed.  -his diabetes is likely pancreatic diabetes given his history of heavy alcohol use in the past, or even possible that he  has LADA (late onset autoimmune diabetes of adults-which will need anti-pancreatic antibody test to classify properly) complicated by CVA with significant carotid artery occlusion waiting for stent placement and Mark Foster remains at a high risk for more acute and chronic complications which include CAD, CVA, CKD, retinopathy, and neuropathy. These are all discussed in detail with the patient.  - I have counseled him on diet management  by adopting a carbohydrate restricted/protein rich diet.  -  Suggestion is made for him to introduce more healthier, complex carbohydrates to his diet, as well as introduction of increased protein intake (animal or plant sources, fruits, and vegetables.    - he is advised to stick to a routine mealtimes to eat 3 meals  a day and avoid unnecessary snacks ( to snack only to correct hypoglycemia).    - I have approached him with the following individualized plan to manage diabetes and patient agrees:    -He is not a suitable candidate for oral antidiabetic medications nor incretin therapy, hence he is advised to discontinue glipizide. -The #1 priority in the treatment of his diabetes will be to avoid hypoglycemia. -Given his pancreatic diabetes, even before he was diagnosed with pancreatic cancer, the best choice of therapy for his diabetes will be exclusively with insulin. -He is advised to increase Basaglar to 24 units nightly, advised to initiate strict monitoring of blood glucose 4 times a day-before meals and at bedtime and return in 4 weeks for reevaluation.   -Patient is encouraged to call clinic for blood glucose levels less than 70 or above 300 mg /dl.  - Patient specific target  A1c;  LDL, HDL, Triglycerides, and  Waist Circumference were discussed in detail. -He is anti-GAD antibodies, anti-islet cell antibodies are negative indicating his diabetes is not likely to be type I, likely related to his prior alcohol abuse causing pancreatic diabetes.  -The priority in this patient would be to avoid hypoglycemia due to lack of alpha cell  glucagon response on the background of alcohol induced diabetes.  2) BP/HTN: His blood pressure is controlled to target.    Patient is not on ACE inhibitors nor ARB.  He will be considered for low-dose lisinopril on subsequent visits.    3) Lipids/HPL:   His recent labs show LDL of 113.  He is advised to continue Lipitor 80 mg p.o. nightly.  4)  Weight/Diet: He does not have access weight to lose, CDE Consult will be initiated , exercise, and detailed carbohydrates information provided.  5) hypothyroidism -He is currently on NP thyroid 30 mg daily.  He will have repeat thyroid function test before his next visit.  He will be considered for levothyroxine treatment as necessary after his next visit.  6) Chronic Care/Health Maintenance:  -he  is on Statin medications and  is encouraged to continue to follow up with Ophthalmology, Dentist,  Podiatrist at  least yearly or according to recommendations, and advised to  stay away from smoking. I have recommended yearly flu vaccine and pneumonia vaccination at least every 5 years; moderate intensity exercise for up to 150 minutes weekly; and  sleep for at least 7 hours a day.  -He is advised to discontinue vitamin B12 supplement since his serum levels of B12 are supraphysiologic levels.  Due to his concern of ongoing fatigue, he will be considered for fasting total testosterone level along with his next blood work.  - Patient Care Time Today:  25 min, of which >50% was spent in reviewing his  current and  previous labs/studies, his blood glucose readings, previous treatments, and medications doses and developing a plan for long-term care based on the latest recommendations for standards of care.  Mark Foster participated in the discussions, expressed understanding, and voiced agreement with the above plans.  All questions were answered to his satisfaction. he is encouraged to contact clinic should he have any questions or concerns prior to his return visit.  - I advised patient to maintain close follow up with Doree Albee, MD for primary care needs.    Follow up plan: - Return in about 4 weeks (around 01/30/2019) for Follow up with [facility] Log[facility] Only - no Labs.  Mark Lloyd, MD [facility] [address] [place], [place] [address] Phone: [phone]  Fax: [phone]    01/02/2019, 10:18 AM  This note was partially dictated with voice recognition software. Similar sounding words can be transcribed inadequately or may not  be corrected upon review.

## 2019-01-02 NOTE — Telephone Encounter (Signed)
24 call back. Patient reports he is feeling run down, but other than that is not experiencing any symptoms or side effects. He has been able to eat and drink, and informed me he was picking up dinner when I called. I told him to call us for any questions or concerns. Verbalized understanding.

## 2019-01-07 ENCOUNTER — Encounter (HOSPITAL_COMMUNITY): Payer: Self-pay | Admitting: Hematology

## 2019-01-07 ENCOUNTER — Inpatient Hospital Stay (HOSPITAL_COMMUNITY): Payer: PRIVATE HEALTH INSURANCE | Attending: Hematology | Admitting: Hematology

## 2019-01-07 ENCOUNTER — Inpatient Hospital Stay (HOSPITAL_COMMUNITY): Payer: PRIVATE HEALTH INSURANCE

## 2019-01-07 ENCOUNTER — Other Ambulatory Visit: Payer: Self-pay

## 2019-01-07 DIAGNOSIS — C251 Malignant neoplasm of body of pancreas: Secondary | ICD-10-CM | POA: Diagnosis not present

## 2019-01-07 DIAGNOSIS — Z7982 Long term (current) use of aspirin: Secondary | ICD-10-CM | POA: Diagnosis not present

## 2019-01-07 DIAGNOSIS — Z794 Long term (current) use of insulin: Secondary | ICD-10-CM | POA: Diagnosis not present

## 2019-01-07 DIAGNOSIS — Z90411 Acquired partial absence of pancreas: Secondary | ICD-10-CM | POA: Insufficient documentation

## 2019-01-07 DIAGNOSIS — R5381 Other malaise: Secondary | ICD-10-CM | POA: Insufficient documentation

## 2019-01-07 DIAGNOSIS — Z5111 Encounter for antineoplastic chemotherapy: Secondary | ICD-10-CM | POA: Insufficient documentation

## 2019-01-07 DIAGNOSIS — Z8673 Personal history of transient ischemic attack (TIA), and cerebral infarction without residual deficits: Secondary | ICD-10-CM | POA: Insufficient documentation

## 2019-01-07 DIAGNOSIS — R110 Nausea: Secondary | ICD-10-CM | POA: Diagnosis not present

## 2019-01-07 DIAGNOSIS — R42 Dizziness and giddiness: Secondary | ICD-10-CM | POA: Insufficient documentation

## 2019-01-07 DIAGNOSIS — Z95828 Presence of other vascular implants and grafts: Secondary | ICD-10-CM

## 2019-01-07 DIAGNOSIS — Z7689 Persons encountering health services in other specified circumstances: Secondary | ICD-10-CM | POA: Insufficient documentation

## 2019-01-07 DIAGNOSIS — Z79899 Other long term (current) drug therapy: Secondary | ICD-10-CM | POA: Diagnosis not present

## 2019-01-07 DIAGNOSIS — E039 Hypothyroidism, unspecified: Secondary | ICD-10-CM | POA: Diagnosis not present

## 2019-01-07 DIAGNOSIS — C259 Malignant neoplasm of pancreas, unspecified: Secondary | ICD-10-CM

## 2019-01-07 DIAGNOSIS — Z800 Family history of malignant neoplasm of digestive organs: Secondary | ICD-10-CM | POA: Diagnosis not present

## 2019-01-07 DIAGNOSIS — R5383 Other fatigue: Secondary | ICD-10-CM | POA: Insufficient documentation

## 2019-01-07 DIAGNOSIS — E119 Type 2 diabetes mellitus without complications: Secondary | ICD-10-CM

## 2019-01-07 DIAGNOSIS — Z87442 Personal history of urinary calculi: Secondary | ICD-10-CM | POA: Diagnosis not present

## 2019-01-07 DIAGNOSIS — Z9081 Acquired absence of spleen: Secondary | ICD-10-CM | POA: Insufficient documentation

## 2019-01-07 DIAGNOSIS — E1140 Type 2 diabetes mellitus with diabetic neuropathy, unspecified: Secondary | ICD-10-CM | POA: Insufficient documentation

## 2019-01-07 LAB — COMPREHENSIVE METABOLIC PANEL
ALT: 15 U/L (ref 0–44)
AST: 13 U/L — ABNORMAL LOW (ref 15–41)
Albumin: 4 g/dL (ref 3.5–5.0)
Alkaline Phosphatase: 76 U/L (ref 38–126)
Anion gap: 8 (ref 5–15)
BUN: 16 mg/dL (ref 8–23)
CO2: 27 mmol/L (ref 22–32)
Calcium: 8.9 mg/dL (ref 8.9–10.3)
Chloride: 100 mmol/L (ref 98–111)
Creatinine, Ser: 0.64 mg/dL (ref 0.61–1.24)
GFR calc Af Amer: >60 mL/min (ref >60)
GFR calc non Af Amer: >60 mL/min (ref >60)
Glucose, Bld: 261 mg/dL — ABNORMAL HIGH (ref 70–99)
Potassium: 4 mmol/L (ref 3.5–5.1)
Sodium: 135 mmol/L (ref 135–145)
Total Bilirubin: 0.7 mg/dL (ref 0.3–1.2)
Total Protein: 6.9 g/dL (ref 6.5–8.1)

## 2019-01-07 LAB — CBC WITH DIFFERENTIAL/PLATELET
Abs Immature Granulocytes: 0.27 10*3/uL — ABNORMAL HIGH (ref 0.00–0.07)
Basophils Absolute: 0.1 10*3/uL (ref 0.0–0.1)
Basophils Relative: 2 %
Eosinophils Absolute: 0.1 10*3/uL (ref 0.0–0.5)
Eosinophils Relative: 2 %
HCT: 40.5 % (ref 39.0–52.0)
Hemoglobin: 13.8 g/dL (ref 13.0–17.0)
Immature Granulocytes: 5 %
Lymphocytes Relative: 25 %
Lymphs Abs: 1.2 10*3/uL (ref 0.7–4.0)
MCH: 32 pg (ref 26.0–34.0)
MCHC: 34.1 g/dL (ref 30.0–36.0)
MCV: 94 fL (ref 80.0–100.0)
Monocytes Absolute: 0.4 10*3/uL (ref 0.1–1.0)
Monocytes Relative: 8 %
Neutro Abs: 2.9 10*3/uL (ref 1.7–7.7)
Neutrophils Relative %: 58 %
Platelets: 124 10*3/uL — ABNORMAL LOW (ref 150–400)
RBC: 4.31 MIL/uL (ref 4.22–5.81)
RDW: 13.5 % (ref 11.5–15.5)
WBC: 5 10*3/uL (ref 4.0–10.5)
nRBC: 0 % (ref 0.0–0.2)

## 2019-01-07 NOTE — Assessment & Plan Note (Signed)
1.  Stage IIb (T2N1) pancreatic adenocarcinoma: -Status post distal pancreatectomy and splenectomy at [facility] by Dr. Zenia Resides on 11/20/2018. - Genetic testing shows BRCA1 heterozygous VUS - As he did not receive any neoadjuvant chemotherapy, I have recommended 6 months of adjuvant chemotherapy. -We reviewed the 2 common regimens including modified FOLFIRINOX, and the gemcitabine/gemcitabine. -After thorough discussion of risks and benefits of each regimen, he has opted to try modified FOLFIRINOX. -He has a port placed.  He will proceed with his cycle 1 today.  I will cut back on the dose of oxaliplatin by 25% as he has questionable signs of neuropathy in the feet.  He denies any tingling or numbness burning type of pain. -CT CAP from 12/27/2018 shows postoperative changes of stable pancreatectomy and splenectomy with postoperative fluid collection in the body of the pancreas adjacent to the resection line, measuring 3 x 3.4 cm, likely postoperative pseudocyst.  No evidence of metastatic disease. -CA-19-9 has improved to 20. -Cycle 1 of FOLFIRINOX (oxaliplatin dose reduced) on 12/30/2018. - He felt slightly tired after chemotherapy.  He denied any GI side effects.  No worsening of neuropathy.  His blood sugars today are staying at 267.  The rest of the blood work has minimally changed. - He will follow-up with [place] next week for cycle 2.  2.  Postsplenectomy state: -He did receive vaccinations prior to splenectomy. -We will follow-up on booster  schedule.  3.  Diabetes: -This is poorly controlled.  Blood sugar today is 267. - He is taking Basaglar 24 units daily.  This was recently increased from 20 units daily. -He follows up with Dr. Dorris Fetch.

## 2019-01-07 NOTE — Patient Instructions (Addendum)
[place] at [facility] Discharge Instructions  You were seen today by Dr. Delton Coombes. He went over your recent lab results.  You will come back in 1 week for labs and treatment.   Thank you for choosing [place] at [facility] to provide your oncology and hematology care.  To afford each patient quality time with our provider, please arrive at least 15 minutes before your scheduled appointment time.   If you have a lab appointment with the [facility] please come in thru the  Main Entrance and check in at the main information desk  You need to re-schedule your appointment should you arrive 10 or more minutes late.  We strive to give you quality time with our providers, and arriving late affects you and other patients whose appointments are after yours.  Also, if you no show three or more times for appointments you may be dismissed from the clinic at the providers discretion.     Again, thank you for choosing [facility].  Our hope is that these requests will decrease the amount of time that you wait before being seen by our physicians.       _____________________________________________________________  Should you have questions after your visit to [facility], please contact our office at [phone] between the hours of 8:00 a.m. and 4:30 p.m.  Voicemails left after 4:00 p.m. will not be returned until the following business day.  For prescription refill requests, have your pharmacy contact our office and allow 72 hours.    Cancer Center Support Programs:   > Cancer Support Group  2nd Tuesday of the month 1pm-2pm, Journey Room

## 2019-01-07 NOTE — Progress Notes (Signed)
[address], [place] [address]   CLINIC:  Medical Oncology/Hematology  PCP:  Doree Albee, MD [place] [address] [phone]   REASON FOR VISIT:  Follow-up for pancreatic cancer      BRIEF ONCOLOGIC HISTORY:    Pancreatic adenocarcinoma ([place])   08/20/2018 Imaging    CT abdomen/pelvis w/ contrast: IMPRESSION: Atrophy and ductal dilatation involving the pancreatic tail, with suspected small soft tissue mass in the pancreatic body which could represent pancreatic carcinoma. Abdomen MRI and MRCP without and with contrast is recommended for further evaluation.  No evidence of hepatobiliary disease.    08/30/2018 Imaging    MRI abdomen w/ contrast: IMPRESSION: 1. Although not definitive, there remains concern of a small hypoenhancing mass at the junction of the pancreatic body and tail associated with atrophy and ductal dilatation in the pancreatic tail. This remains concerning for pancreatic neoplasm. Postinflammatory stricture less likely. Besides a tiny cystic lesion in the pancreatic tail, there are no other signs of previous pancreatitis. Further evaluation with endoscopic ultrasound for possible biopsy strongly recommended. 2. No evidence of metastatic disease. 3. Cholelithiasis without evidence of cholecystitis or biliary dilatation.    09/03/2018 Initial Diagnosis    Pancreatic adenocarcinoma ([place])    10/03/2018 Procedure    EUS: - 2.6cm irregularly shaped mass in the body of the pancreas that causing main pancreatic duct obstruction and dilation. The mass abuts the splenic vessels but no other significant vascular structures and it was sampled with trangastric EUS FNA. Preliminary cytology is + for malignancy, likely well-differentiated adenocarcinoma. It appears surgically resectable.    10/03/2018 Pathology Results    Accession: [id number]  FINE NEEDLE ASPIRATION, ENDOSCOPIC, PANCREAS BODY  (SPECIMEN 1 OF 1 COLLECTED 10/03/18): ADENOCARCINOMA.    10/17/2018 Imaging    PET: IMPRESSION: 1. Tiny focus of hypermetabolism identified in the body of pancreas, adjacent to the abrupt cut off of the main pancreatic duct. No evidence for hypermetabolic metastatic disease in the neck, chest, abdomen, or pelvis. 2. Cholelithiasis. 3.  Aortic Atherosclerois (ICD10-170.0) 4. Prostatomegaly    11/12/2018 Genetic Testing    BRCA1 VUS identified on the common hereditary cancer panel.  The Hereditary Gene Panel offered by [facility] includes sequencing and/or deletion duplication testing of the following 47 genes: APC, ATM, AXIN2, BARD1, BMPR1A, BRCA1, BRCA2, BRIP1, CDH1, CDK4, CDKN2A (p14ARF), CDKN2A (p16INK4a), CHEK2, CTNNA1, DICER1, EPCAM (Deletion/duplication testing only), GREM1 (promoter region deletion/duplication testing only), KIT, MEN1, MLH1, MSH2, MSH3, MSH6, MUTYH, NBN, NF1, NHTL1, PALB2, PDGFRA, PMS2, POLD1, POLE, PTEN, RAD50, RAD51C, RAD51D, SDHB, SDHC, SDHD, SMAD4, SMARCA4. STK11, TP53, TSC1, TSC2, and VHL.  The following genes were evaluated for sequence changes only: SDHA and HOXB13 c.251G>A variant only. The report date is November 12, 2018.    11/20/2018 Surgery    Distal subtotal pancreatectomy with splenectomy at [facility]    11/20/2018 Surgery    TUMOR   Tumor Site:  Pancreatic body    Histologic Type:  Ductal adenocarcinoma    Histologic Grade:  G1: Well differentiated    Tumor Size:  Greatest dimension in Centimeters (cm): 2.8 Centimeters (cm)    Additional Dimension in Centimeters (cm):  2.7 Centimeters (cm)    Additional Dimension in Centimeters (cm):  1.8 Centimeters (cm)   Tumor Extent:      Tumor Extension:  Tumor is confined to pancreas    Accessory Findings:      Treatment Effect:  No known presurgical therapy  Lymphovascular Invasion:  Not identified     Perineural Invasion:  Not identified   MARGINS   Margins:       Proximal Pancreatic Parenchymal Margin:  Uninvolved by invasive carcinoma and pancreatic high-grade intraepithelial neoplasia      Distance of Invasive Carcinoma from Margin:  0.6 Centimeters (cm)   :      Other Margin:  Splenic margin.      Margin Status:  Uninvolved by invasive carcinoma   LYMPH NODES  Number of Lymph Nodes Involved:  2   Number of Lymph Nodes Examined:  16   PATHOLOGIC STAGE CLASSIFICATION (pTNM, AJCC 8th Edition)  TNM Descriptors:  Not applicable   Primary Tumor (pT):  pT2   Regional Lymph Nodes (pN):  pN1   ADDITIONAL FINDINGS  Additional Pathologic Findings:  Pancreatic intraepithelial neoplasia    Highest Grade (PanIN):  High grade PanIN is present.   Additional Pathologic Findings:  Benign unilocular pancreatic cyst (1.3 cm in greatest dimension) at the pancreatic tail.    12/30/2018 -  Chemotherapy    The patient had palonosetron (ALOXI) injection 0.25 mg, 0.25 mg, Intravenous,  Once, 1 of 4 cycles Administration: 0.25 mg (12/30/2018) pegfilgrastim (NEULASTA) injection 6 mg, 6 mg, Subcutaneous, Once, 1 of 4 cycles Administration: 6 mg (01/01/2019) irinotecan (CAMPTOSAR) 280 mg in sodium chloride 0.9 % 500 mL chemo infusion, 150 mg/m2 = 280 mg (100 % of original dose 150 mg/m2), Intravenous,  Once, 1 of 4 cycles Dose modification: 150 mg/m2 (original dose 150 mg/m2, Cycle 1, Reason: Provider Judgment) Administration: 280 mg (12/30/2018) leucovorin 700 mg in sodium chloride 0.9 % 250 mL infusion, 744 mg, Intravenous,  Once, 1 of 4 cycles Administration: 700 mg (12/30/2018) oxaliplatin (ELOXATIN) 120 mg in dextrose 5 % 500 mL chemo infusion, 63.75 mg/m2 = 120 mg (75 % of original dose 85 mg/m2), Intravenous,  Once, 1 of 4 cycles Dose modification: 63.75 mg/m2 (75 % of original dose 85 mg/m2, Cycle 1, Reason: Other (see comments), Comment: high risk for worsening neuropathy) Administration: 120 mg (12/30/2018) fosaprepitant (EMEND)  150 mg, dexamethasone (DECADRON) 12 mg in sodium chloride 0.9 % 145 mL IVPB, , Intravenous,  Once, 1 of 4 cycles Administration:  (12/30/2018) fluorouracil (ADRUCIL) 4,450 mg in sodium chloride 0.9 % 61 mL chemo infusion, 2,400 mg/m2 = 4,450 mg, Intravenous, 1 Day/Dose, 1 of 4 cycles Administration: 4,450 mg (12/30/2018)  for chemotherapy treatment.       CANCER STAGING: Cancer Staging No matching staging information was found for the patient.   INTERVAL HISTORY:  Mr. Hark 63 y.o. male returns for routine follow-up. He is here today alone. He states that he has some dizziness at times. He states that he has shortness of breath at times with activity. Denies any nausea, vomiting, or diarrhea. Denies any new pains. Had not noticed any recent bleeding such as epistaxis, hematuria or hematochezia. Denies recent chest pain on exertion, shortness of breath on minimal exertion, pre-syncopal episodes, or palpitations. Denies any numbness or tingling in hands or feet. Denies any recent fevers, infections, or recent hospitalizations. Patient reports appetite at 50% and energy level at 50%.  REVIEW OF SYSTEMS:  Review of Systems  Constitutional: Positive for fatigue.  All other systems reviewed and are negative.    PAST MEDICAL/SURGICAL HISTORY:  Past Medical History:  Diagnosis Date   Cervical compression fracture (HCC)    Cervical spinal stenosis    Diabetic neuropathy (HCC)    Bilateral legs   Diverticulitis  Family history of pancreatic cancer    History of kidney stones    Hypothyroidism    Pancreatic cancer ([place])    Stenosis of left vertebral artery    Stroke [facility])    2011   Type 2 diabetes mellitus ([place])    Past Surgical History:  Procedure Laterality Date   APPENDECTOMY     COLON RESECTION     For diverticulitis   COLON SURGERY     ESOPHAGOGASTRODUODENOSCOPY N/A 10/03/2018   Procedure: ESOPHAGOGASTRODUODENOSCOPY (EGD);  Surgeon: [name], MD;   Location: [name];  Service: Endoscopy;  Laterality: N/A;   EUS N/A 10/03/2018   Procedure: UPPER ENDOSCOPIC ULTRASOUND (EUS) RADIAL;  Surgeon: [name], MD;  Location: [facility];  Service: Endoscopy;  Laterality: N/A;   FINE NEEDLE ASPIRATION N/A 10/03/2018   Procedure: FINE NEEDLE ASPIRATION (FNA) LINEAR;  Surgeon: [name], MD;  Location: [facility];  Service: Endoscopy;  Laterality: N/A;   IR ANGIO INTRA EXTRACRAN SEL COM CAROTID INNOMINATE BILAT MOD SED  01/21/2018   IR ANGIO INTRA EXTRACRAN SEL COM CAROTID INNOMINATE UNI R MOD SED  03/21/2018   IR ANGIO VERTEBRAL SEL VERTEBRAL BILAT MOD SED  01/21/2018   IR TRANSCATH EXCRAN VERT OR CAR A STENT  03/21/2018   PORTACATH PLACEMENT Left 12/23/2018   Procedure: INSERTION PORT-A-CATH;  Surgeon: Virl Cagey, MD;  Location: [facility];  Service: General;  Laterality: Left;   RADIOLOGY WITH ANESTHESIA N/A 03/21/2018   Procedure: IR WITH ANESTHESIA WITH STENT PLACEMENT;  Surgeon: Luanne Bras, MD;  Location: [facility];  Service: Radiology;  Laterality: N/A;   ROTATOR CUFF REPAIR     Left     SOCIAL HISTORY:  Social History   Socioeconomic History   Marital status: Single    Spouse name: Not on file   Number of children: 2   Years of education: Not on file   Highest education level: Not on file  Occupational History   Occupation: Estate agent  Social Needs   Financial resource strain: Somewhat hard   Food insecurity:    Worry: Never true    Inability: Never true   Transportation needs:    Medical: No    Non-medical: No  Tobacco Use   Smoking status: Never Smoker   Smokeless tobacco: Never Used  Substance and Sexual Activity   Alcohol use: Not Currently   Drug use: Never   Sexual activity: Not on file  Lifestyle   Physical activity:    Days per week: 0 days    Minutes per session: 0 min   Stress: To some extent  Relationships   Social connections:    Talks on phone: More  than three times a week    Gets together: Once a week    Attends religious service: More than 4 times per year    Active member of club or organization: No    Attends meetings of clubs or organizations: Never    Relationship status: Divorced   Intimate partner violence:    Fear of current or ex partner: No    Emotionally abused: No    Physically abused: No    Forced sexual activity: No  Other Topics Concern   Not on file  Social History Narrative   Not on file    FAMILY HISTORY:  Family History  Problem Relation Age of Onset   Stroke Mother    Pancreatic cancer Father 60       d. 72  Stroke Maternal Grandmother    Heart attack Maternal Grandfather    Heart Problems Paternal Grandfather    Scoliosis Daughter    Muscular dystrophy Grandson     CURRENT MEDICATIONS:  Outpatient Encounter Medications as of 01/07/2019  Medication Sig Note   aspirin 81 MG tablet Take 1 tablet (81 mg total) by mouth daily.    atorvastatin (LIPITOR) 80 MG tablet Take 1 tablet (80 mg total) by mouth daily at 6 PM. (Patient taking differently: Take 80 mg by mouth daily. )    Continuous Blood Gluc Sensor (FREESTYLE LIBRE 14 DAY SENSOR) MISC 1 Device by Does not apply route every 14 (fourteen) days.    dextrose 5 % SOLN 1,000 mL with fluorouracil 5 GM/100ML SOLN Inject into the vein over 48 hr.    ECHINACEA EXTRACT PO Take 2 tablets by mouth daily.     FLUOROURACIL IV Inject into the vein every 14 (fourteen) days.    gabapentin (NEURONTIN) 300 MG capsule Take 1 capsule (300 mg total) by mouth 2 (two) times daily.    Insulin Glargine (BASAGLAR KWIKPEN) 100 UNIT/ML SOPN Inject 24 Units into the skin daily.  12/23/2018: Pt took 12 units this AM.   Insulin Pen Needle 32G X 4 MM MISC 1 Device by Does not apply route daily.    IRINOTECAN HCL IV Inject into the vein every 14 (fourteen) days.    LEUCOVORIN CALCIUM IV Inject into the vein every 14 (fourteen) days.    lidocaine-prilocaine  (EMLA) cream Apply pea-sized amount to port-a-cath site and cover with plastic wrap one hour prior to chemotherapy appointments    loperamide (IMODIUM A-D) 2 MG tablet Take 1 tablet (2 mg total) by mouth as needed. Take 2 at diarrhea onset , then 1 every 2hr until 12hrs with no BM. May take 2 every 4hrs at night. If diarrhea recurs repeat.    NP THYROID 30 MG tablet Take 30 mg by mouth daily before breakfast.     Omega-3 1000 MG CAPS Take 1,000 mg by mouth daily.    OXALIPLATIN IV Inject into the vein every 14 (fourteen) days.    pantoprazole (PROTONIX) 20 MG tablet Take 20 mg by mouth daily.     prochlorperazine (COMPAZINE) 10 MG tablet Take 1 tablet (10 mg total) by mouth every 6 (six) hours as needed (NAUSEA).    sertraline (ZOLOFT) 50 MG tablet Take 50 mg by mouth every morning.     tamsulosin (FLOMAX) 0.4 MG CAPS capsule Take 0.4 mg by mouth daily.     ticagrelor (BRILINTA) 90 MG TABS tablet Take 1 tablet (90 mg total) by mouth 2 (two) times daily.    traMADol (ULTRAM) 50 MG tablet Take 1 tablet (50 mg total) by mouth every 8 (eight) hours as needed for up to 30 days.    vitamin B-12 (CYANOCOBALAMIN) 1000 MCG tablet Take 1,000 mcg by mouth daily.    Facility-Administered Encounter Medications as of 01/07/2019  Medication   0.9 %  sodium chloride infusion   0.9 %  sodium chloride infusion   dextrose 5 % solution   sodium chloride flush (NS) 0.9 % injection 10 mL    ALLERGIES:  Allergies  Allergen Reactions   Demerol [Meperidine Hcl] Other (See Comments)    convulsions     PHYSICAL EXAM:  ECOG Performance status: 1  Vitals:   01/07/19 0933  BP: 111/64  Pulse: 75  Resp: 18  Temp: 98.3 F (36.8 C)  SpO2: 100%   Filed  Weights   01/07/19 0933  Weight: 152 lb 11.2 oz (69.3 kg)    Physical Exam Vitals signs reviewed.  Constitutional:      Appearance: Normal appearance.  Cardiovascular:     Rate and Rhythm: Normal rate and regular rhythm.     Heart  sounds: Normal heart sounds.  Pulmonary:     Effort: Pulmonary effort is normal.     Breath sounds: Normal breath sounds.  Abdominal:     General: There is no distension.     Palpations: Abdomen is soft. There is no mass.  Musculoskeletal:        General: No swelling.  Skin:    General: Skin is warm.  Neurological:     General: No focal deficit present.     Mental Status: He is alert and oriented to person, place, and time.  Psychiatric:        Mood and Affect: Mood normal.        Behavior: Behavior normal.      LABORATORY DATA:  I have reviewed the labs as listed.  CBC    Component Value Date/Time   WBC 5.0 01/07/2019 0914   RBC 4.31 01/07/2019 0914   HGB 13.8 01/07/2019 0914   HGB 14.9 12/11/2018 1018   HCT 40.5 01/07/2019 0914   PLT 124 (L) 01/07/2019 0914   PLT 335 12/11/2018 1018   MCV 94.0 01/07/2019 0914   MCH 32.0 01/07/2019 0914   MCHC 34.1 01/07/2019 0914   RDW 13.5 01/07/2019 0914   LYMPHSABS 1.2 01/07/2019 0914   MONOABS 0.4 01/07/2019 0914   EOSABS 0.1 01/07/2019 0914   BASOSABS 0.1 01/07/2019 0914   CMP Latest Ref Rng & Units 01/07/2019 12/27/2018 12/11/2018  Glucose 70 - 99 mg/dL 261(H) 337(H) 377(H)  BUN 8 - 23 mg/dL 16 18 32(H)  Creatinine 0.61 - 1.24 mg/dL 0.64 0.60(L) 0.88  Sodium 135 - 145 mmol/L 135 136 138  Potassium 3.5 - 5.1 mmol/L 4.0 4.2 4.4  Chloride 98 - 111 mmol/L 100 101 101  CO2 22 - 32 mmol/L _0 Calcium 8.9 - 10.3 mg/dL 8.9 9.3 9.4  Total Protein 6.5 - 8.1 g/dL 6.9 7.2 7.4  Total Bilirubin 0.3 - 1.2 mg/dL 0.7 0.7 0.8  Alkaline Phos 38 - 126 U/L 76 67 74  AST 15 - 41 U/L 13(L) 17 16  ALT 0 - 44 U/L _1 DIAGNOSTIC IMAGING:  I have independently reviewed the scans and discussed with the patient.   I have reviewed Venita Lick LPN's note and agree with the documentation.  I personally performed a face-to-face visit, made revisions and my assessment and plan is as follows.    ASSESSMENT & PLAN:    Pancreatic adenocarcinoma (HCC) 1.  Stage IIb (T2N1) pancreatic adenocarcinoma: -Status post distal pancreatectomy and splenectomy at [facility] by Dr. Zenia Resides on 11/20/2018. - Genetic testing shows BRCA1 heterozygous VUS - As he did not receive any neoadjuvant chemotherapy, I have recommended 6 months of adjuvant chemotherapy. -We reviewed the 2 common regimens including modified FOLFIRINOX, and the gemcitabine/gemcitabine. -After thorough discussion of risks and benefits of each regimen, he has opted to try modified FOLFIRINOX. -He has a port placed.  He will proceed with his cycle 1 today.  I will cut back on the dose of oxaliplatin by 25% as he has questionable signs of neuropathy in the feet.  He denies any tingling or numbness burning type of pain. -CT CAP  from 12/27/2018 shows postoperative changes of stable pancreatectomy and splenectomy with postoperative fluid collection in the body of the pancreas adjacent to the resection line, measuring 3 x 3.4 cm, likely postoperative pseudocyst.  No evidence of metastatic disease. -CA-19-9 has improved to 20. -Cycle 1 of FOLFIRINOX (oxaliplatin dose reduced) on 12/30/2018. - He felt slightly tired after chemotherapy.  He denied any GI side effects.  No worsening of neuropathy.  His blood sugars today are staying at 267.  The rest of the blood work has minimally changed. - He will follow-up with [place] next week for cycle 2.  2.  Postsplenectomy state: -He did receive vaccinations prior to splenectomy. -We will follow-up on booster  schedule.  3.  Diabetes: -This is poorly controlled.  Blood sugar today is 267. - He is taking Basaglar 24 units daily.  This was recently increased from 20 units daily. -He follows up with Dr. Dorris Fetch.      Orders placed this encounter:  No orders of the defined types were placed in this encounter.     Derek Jack, MD [place] [phone]

## 2019-01-08 ENCOUNTER — Telehealth: Payer: Self-pay

## 2019-01-08 NOTE — Telephone Encounter (Signed)
Mark Foster is stating that his blood sugars are running high  Sun 05/03 119

## 2019-01-09 ENCOUNTER — Other Ambulatory Visit: Payer: Self-pay

## 2019-01-09 ENCOUNTER — Ambulatory Visit (INDEPENDENT_AMBULATORY_CARE_PROVIDER_SITE_OTHER): Payer: PRIVATE HEALTH INSURANCE | Admitting: "Endocrinology

## 2019-01-09 ENCOUNTER — Encounter: Payer: Self-pay | Admitting: "Endocrinology

## 2019-01-09 DIAGNOSIS — E039 Hypothyroidism, unspecified: Secondary | ICD-10-CM

## 2019-01-09 DIAGNOSIS — E1165 Type 2 diabetes mellitus with hyperglycemia: Secondary | ICD-10-CM | POA: Diagnosis not present

## 2019-01-09 DIAGNOSIS — E782 Mixed hyperlipidemia: Secondary | ICD-10-CM

## 2019-01-09 DIAGNOSIS — I10 Essential (primary) hypertension: Secondary | ICD-10-CM | POA: Diagnosis not present

## 2019-01-09 DIAGNOSIS — I679 Cerebrovascular disease, unspecified: Secondary | ICD-10-CM

## 2019-01-09 MED ORDER — ATORVASTATIN CALCIUM 40 MG PO TABS: 40.0000 mg | ORAL_TABLET | Freq: Every day | ORAL | 3 refills | Status: DC

## 2019-01-09 MED ORDER — INSULIN GLARGINE 100 UNIT/ML SOLOSTAR PEN: 24.0000 [IU] | PEN_INJECTOR | Freq: Every day | SUBCUTANEOUS | 2 refills | Status: DC

## 2019-01-09 NOTE — Progress Notes (Signed)
01/09/2019, 3:07 PM                                                     Endocrinology Telehealth Visit Follow up Note -During COVID -19 Pandemic  This visit type was conducted due to national recommendations for restrictions regarding the COVID-19 Pandemic  in an effort to limit this patient's exposure and mitigate transmission of the corona virus.  Due to his co-morbid illnesses, Mark Foster is at  moderate to high risk for complications without adequate follow up.  This format is felt to be most appropriate for him at this time.  I connected with this patient on 01/09/2019   by telephone and verified that I am speaking with the correct person using two identifiers. Mark Foster, Apr 05, 1956. he has verbally consented to this visit. All issues noted in this document were discussed and addressed. The format was not optimal for physical exam.    Subjective:    Patient ID: Mark Foster, male    DOB: Sep 29, 1955.  Mark Foster is being engaged in telehealth in follow-up for management of currently uncontrolled symptomatic pancreatic diabetes, hypothyroidism, hyperlipidemia. PMD:  Doree Albee, MD.   Past Medical History:  Diagnosis Date  . Cervical compression fracture ([place])   . Cervical spinal stenosis   . Diabetic neuropathy (HCC)    Bilateral legs  . Diverticulitis   . Family history of pancreatic cancer   . History of kidney stones   . Hypothyroidism   . Pancreatic cancer ([place])   . Stenosis of left vertebral artery   . Stroke [facility])    2011  . Type 2 diabetes mellitus ([place])    Past Surgical History:  Procedure Laterality Date  . APPENDECTOMY    . COLON RESECTION     For diverticulitis  . COLON SURGERY    . ESOPHAGOGASTRODUODENOSCOPY N/A 10/03/2018   Procedure: ESOPHAGOGASTRODUODENOSCOPY (EGD);  Surgeon: [name], MD;  Location: [name];  Service: Endoscopy;  Laterality: N/A;  . EUS  N/A 10/03/2018   Procedure: UPPER ENDOSCOPIC ULTRASOUND (EUS) RADIAL;  Surgeon: [name], MD;  Location: [facility];  Service: Endoscopy;  Laterality: N/A;  . FINE NEEDLE ASPIRATION N/A 10/03/2018   Procedure: FINE NEEDLE ASPIRATION (FNA) LINEAR;  Surgeon: [name], MD;  Location: [facility];  Service: Endoscopy;  Laterality: N/A;  . IR ANGIO INTRA EXTRACRAN SEL COM CAROTID INNOMINATE BILAT MOD SED  01/21/2018  . IR ANGIO INTRA EXTRACRAN SEL COM CAROTID INNOMINATE UNI R MOD SED  03/21/2018  . IR ANGIO VERTEBRAL SEL VERTEBRAL BILAT MOD SED  01/21/2018  . IR TRANSCATH EXCRAN VERT OR CAR A STENT  03/21/2018  . PORTACATH PLACEMENT Left 12/23/2018   Procedure: INSERTION PORT-A-CATH;  Surgeon: Virl Cagey, MD;  Location: [facility];  Service: General;  Laterality: Left;  . RADIOLOGY WITH ANESTHESIA N/A 03/21/2018   Procedure: IR WITH ANESTHESIA WITH STENT PLACEMENT;  Surgeon: Luanne Bras, MD;  Location: [facility];  Service: Radiology;  Laterality: N/A;  . ROTATOR CUFF REPAIR     Left   Social History   Socioeconomic History  . Marital status: Single    Spouse name: Not on file  . Number of children: 2  . Years of education: Not on file  . Highest education level: Not on file  Occupational History  . Occupation: Estate agent  Social Needs  . Financial resource strain: Somewhat hard  . Food insecurity:    Worry: Never true    Inability: Never true  . Transportation needs:    Medical: No    Non-medical: No  Tobacco Use  . Smoking status: Never Smoker  . Smokeless tobacco: Never Used  Substance and Sexual Activity  . Alcohol use: Not Currently  . Drug use: Never  . Sexual activity: Not on file  Lifestyle  . Physical activity:    Days per week: 0 days    Minutes per session: 0 min  . Stress: To some extent  Relationships  . Social connections:    Talks on phone: More than three times a week    Gets together: Once a week    Attends religious service: More  than 4 times per year    Active member of club or organization: No    Attends meetings of clubs or organizations: Never    Relationship status: Divorced  Other Topics Concern  . Not on file  Social History Narrative  . Not on file   Outpatient Encounter Medications as of 01/09/2019  Medication Sig  . aspirin 81 MG tablet Take 1 tablet (81 mg total) by mouth daily.  Marland Kitchen atorvastatin (LIPITOR) 80 MG tablet Take 1 tablet (80 mg total) by mouth daily at 6 PM. (Patient taking differently: Take 80 mg by mouth daily. )  . Continuous Blood Gluc Sensor (FREESTYLE LIBRE 14 DAY SENSOR) MISC 1 Device by Does not apply route every 14 (fourteen) days.  Marland Kitchen dextrose 5 % SOLN 1,000 mL with fluorouracil 5 GM/100ML SOLN Inject into the vein over 48 hr.  . ECHINACEA EXTRACT PO Take 2 tablets by mouth daily.   Marland Kitchen FLUOROURACIL IV Inject into the vein every 14 (fourteen) days.  Marland Kitchen gabapentin (NEURONTIN) 300 MG capsule Take 1 capsule (300 mg total) by mouth 2 (two) times daily.  . Insulin Glargine (LANTUS SOLOSTAR) 100 UNIT/ML Solostar Pen Inject 24 Units into the skin at bedtime.  . Insulin Pen Needle 32G X 4 MM MISC 1 Device by Does not apply route daily.  . IRINOTECAN HCL IV Inject into the vein every 14 (fourteen) days.  Marland Kitchen LEUCOVORIN CALCIUM IV Inject into the vein every 14 (fourteen) days.  Marland Kitchen lidocaine-prilocaine (EMLA) cream Apply pea-sized amount to port-a-cath site and cover with plastic wrap one hour prior to chemotherapy appointments  . loperamide (IMODIUM A-D) 2 MG tablet Take 1 tablet (2 mg total) by mouth as needed. Take 2 at diarrhea onset , then 1 every 2hr until 12hrs with no BM. May take 2 every 4hrs at night. If diarrhea recurs repeat.  . NP THYROID 30 MG tablet Take 30 mg by mouth daily before breakfast.   . Omega-3 1000 MG CAPS Take 1,000 mg by mouth daily.  . OXALIPLATIN IV Inject into the vein every 14 (fourteen) days.  . pantoprazole (PROTONIX) 20 MG tablet Take 20 mg by mouth daily.   .  prochlorperazine (COMPAZINE) 10 MG tablet Take 1 tablet (10 mg total) by mouth every 6 (six) hours as needed (  NAUSEA).  Marland Kitchen sertraline (ZOLOFT) 50 MG tablet Take 50 mg by mouth every morning.   . tamsulosin (FLOMAX) 0.4 MG CAPS capsule Take 0.4 mg by mouth daily.   . ticagrelor (BRILINTA) 90 MG TABS tablet Take 1 tablet (90 mg total) by mouth 2 (two) times daily.  . traMADol (ULTRAM) 50 MG tablet Take 1 tablet (50 mg total) by mouth every 8 (eight) hours as needed for up to 30 days.  . vitamin B-12 (CYANOCOBALAMIN) 1000 MCG tablet Take 1,000 mcg by mouth daily.  . [DISCONTINUED] Insulin Glargine (BASAGLAR KWIKPEN) 100 UNIT/ML SOPN Inject 24 Units into the skin daily.    Facility-Administered Encounter Medications as of 01/09/2019  Medication  . 0.9 %  sodium chloride infusion  . 0.9 %  sodium chloride infusion  . dextrose 5 % solution  . sodium chloride flush (NS) 0.9 % injection 10 mL    ALLERGIES: Allergies  Allergen Reactions  . Demerol [Meperidine Hcl] Other (See Comments)    convulsions    VACCINATION STATUS: Immunization History  Administered Date(s) Administered  . HiB (PRP-T) 10/23/2018  . Influenza-Unspecified 08/02/2018  . Meningococcal Mcv4o 10/23/2018  . Pneumococcal Conjugate-13 10/23/2018    Diabetes  He presents for his follow-up diabetic visit. Onset time: He was diagnosed at approximate age of 66 years. His disease course has been worsening (He did have A1c of 9.7% in May 2019.  However, more recently he has documented controlled glycemic profile averaging 134 over the last 2 weeks.). There are no hypoglycemic associated symptoms. Pertinent negatives for hypoglycemia include no confusion, headaches, pallor or seizures. There are no diabetic associated symptoms. Pertinent negatives for diabetes include no chest pain, no fatigue, no polydipsia, no polyphagia, no polyuria and no weakness. There are no hypoglycemic complications. Symptoms are worsening. Diabetic  complications include a CVA. Pertinent negatives for diabetic complications include no heart disease. (Awaiting stent placement in his carotid artery.) Risk factors for coronary artery disease include diabetes mellitus, dyslipidemia, hypertension, male sex and sedentary lifestyle. Current diabetic treatment includes insulin injections. He is following a generally unhealthy diet. When asked about meal planning, he reported none. He has not had a previous visit with a dietitian. He rarely participates in exercise. His home blood glucose trend is increasing steadily. His breakfast blood glucose range is generally 180-200 mg/dl. His lunch blood glucose range is generally 180-200 mg/dl. His dinner blood glucose range is generally 180-200 mg/dl. His bedtime blood glucose range is generally 180-200 mg/dl. His overall blood glucose range is 180-200 mg/dl. An ACE inhibitor/angiotensin II receptor blocker is not being taken.  Hyperlipidemia  This is a chronic problem. The current episode started more than 1 year ago. The problem is uncontrolled. Exacerbating diseases include diabetes. Pertinent negatives include no chest pain, myalgias or shortness of breath. Current antihyperlipidemic treatment includes statins. Risk factors for coronary artery disease include diabetes mellitus, dyslipidemia, hypertension, male sex and a sedentary lifestyle.   Review of systems: Limited as above   Objective:    There were no vitals taken for this visit.  Wt Readings from Last 3 Encounters:  01/07/19 152 lb 11.2 oz (69.3 kg)  01/02/19 154 lb (69.9 kg)  12/30/18 154 lb 6.4 oz (70 kg)      CMP ( most recent) CMP     Component Value Date/Time   NA 135 01/07/2019 0914   K 4.0 01/07/2019 0914   CL 100 01/07/2019 0914   CO2 27 01/07/2019 0914   GLUCOSE 261 (H) 01/07/2019 0914  BUN 16 01/07/2019 0914   CREATININE 0.64 01/07/2019 0914   CREATININE 0.88 12/11/2018 1018   CALCIUM 8.9 01/07/2019 0914   PROT 6.9 01/07/2019  0914   ALBUMIN 4.0 01/07/2019 0914   AST 13 (L) 01/07/2019 0914   AST 16 12/11/2018 1018   ALT 15 01/07/2019 0914   ALT 19 12/11/2018 1018   ALKPHOS 76 01/07/2019 0914   BILITOT 0.7 01/07/2019 0914   BILITOT 0.8 12/11/2018 1018   GFRNONAA >60 01/07/2019 0914   GFRNONAA >60 12/11/2018 1018   GFRAA >60 01/07/2019 0914   GFRAA >60 12/11/2018 1018     Diabetic Labs (most recent): Lab Results  Component Value Date   HGBA1C 9.0 (H) 11/12/2018   HGBA1C 6.2 (H) 05/20/2018   HGBA1C 6.5 (H) 03/19/2018     Lipid Panel ( most recent) Lipid Panel     Component Value Date/Time   CHOL 168 01/20/2018 0247   TRIG 51 01/20/2018 0247   HDL 45 01/20/2018 0247   CHOLHDL 3.7 01/20/2018 0247   VLDL 10 01/20/2018 0247   LDLCALC 113 (H) 01/20/2018 0247    Assessment & Plan:   1. Uncontrolled type 2 diabetes mellitus with hyperglycemia (HCC)  - Mark Foster has history of uncontrolled diabetes with recent A1c of 9.%.   -more recently patient is diagnosed with pancreatic cancer currently in the middle of chemotherapy. He is on Basaglar 24 units nightly .  He reports that his blood glucose ranges from 148-256, no hypoglycemia.  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    -  Recent labs reviewed.  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 These are all discussed in detail with the patient.  - I have counseled him on diet management  by adopting a carbohydrate restricted/protein rich diet.  -   Suggestion is made for him to introduce more healthier, complex carbohydrates to his diet, as well as introduction of increased protein intake (animal or plant sources, fruits, and vegetables.    - he is advised to stick to a routine mealtimes to eat 3 meals  a day and avoid unnecessary snacks ( to snack only to correct hypoglycemia).    - I have approached him with the following individualized plan to manage diabetes and patient agrees:   -He is not a suitable candidate for oral antidiabetic medications nor incretin therapy, hence he is advised to discontinue glipizide. -The #1 priority in the treatment of his diabetes will be to avoid hypoglycemia. -Given his pancreatic diabetes, even before he was diagnosed with pancreatic cancer, the best choice of therapy for his diabetes will be exclusively with insulin. -He is advised to continue Basaglar 24 units nightly until he finishes his current supplies, and new prescription for Lantus will be sent to his pharmacy.  He is advised to continue monitoring blood glucose at least 2 times a day-before breakfast and at bedtime.     -Patient is encouraged to call clinic for blood glucose levels less than 70 or above 300 mg /dl.  - Patient specific target  A1c;  LDL, HDL, Triglycerides, and  Waist Circumference were discussed in detail. -He is anti-GAD antibodies, anti-islet cell antibodies are negative indicating his diabetes is not likely to be type I, likely related to his prior alcohol abuse causing pancreatic diabetes.  -The priority in this patient would be to avoid hypoglycemia due to lack of alpha cell  glucagon response on the background of alcohol induced diabetes.  2) BP/HTN: he is advised to home monitor blood pressure and report if > 140/90 on 2 separate readings.   Patient is not on ACE inhibitors nor ARB.  He will be considered for low-dose lisinopril on subsequent visits.    3) Lipids/HPL:   His recent labs show LDL of 113.  I will lower  the dose of his Lipitor to 40 mg from 80 mg for subsequent refills.    4)  Weight/Diet: He does not have access weight to lose, CDE Consult will be initiated , exercise, and detailed carbohydrates information provided.  5) hypothyroidism -He is currently on NP thyroid 30 mg daily.  He will have repeat thyroid function test before his next visit.  He will be considered for levothyroxine treatment as necessary after his next visit.  6) Chronic Care/Health Maintenance:  -he  is on Statin medications and  is encouraged to continue to follow up with Ophthalmology, Dentist,  Podiatrist at least yearly or according to recommendations, and advised to  stay away from smoking. I have recommended yearly flu vaccine and pneumonia vaccination at least every 5 years;  and  sleep for at least 7 hours a day.  - Patient Care Time Today:  25 min, of which >50% was spent in reviewing his  current and  previous labs/studies, previous treatments, and medications doses and developing a plan for long-term care based on the latest recommendations for standards of care.  Mark Foster participated in the discussions, expressed understanding, and voiced agreement with the above plans.  All questions were answered to his satisfaction. he is encouraged to contact clinic should he have any questions or concerns prior to his return visit.   - I advised patient to maintain close follow up with Doree Albee, MD for primary care needs.   Follow up plan: - Return in about 2 weeks (around 01/23/2019) for Follow up with Meter and Logs Only - no Labs.  Glade Lloyd, MD [facility] [address] [place], [place] [address] Phone: [phone]  Fax: [phone]    01/09/2019, 3:07 PM  This note was partially dictated with voice recognition software. Similar sounding words can be transcribed inadequately or may not  be corrected upon review.

## 2019-01-13 ENCOUNTER — Other Ambulatory Visit: Payer: Self-pay

## 2019-01-13 ENCOUNTER — Inpatient Hospital Stay (HOSPITAL_COMMUNITY): Payer: PRIVATE HEALTH INSURANCE

## 2019-01-13 ENCOUNTER — Encounter (HOSPITAL_COMMUNITY): Payer: Self-pay

## 2019-01-13 ENCOUNTER — Telehealth: Payer: Self-pay | Admitting: "Endocrinology

## 2019-01-13 VITALS — BP 105/46 | HR 70 | Temp 97.8°F | Resp 16 | Wt 152.4 lb

## 2019-01-13 DIAGNOSIS — Z95828 Presence of other vascular implants and grafts: Secondary | ICD-10-CM

## 2019-01-13 DIAGNOSIS — C259 Malignant neoplasm of pancreas, unspecified: Secondary | ICD-10-CM

## 2019-01-13 DIAGNOSIS — Z5111 Encounter for antineoplastic chemotherapy: Secondary | ICD-10-CM | POA: Diagnosis not present

## 2019-01-13 LAB — CBC WITH DIFFERENTIAL/PLATELET
Abs Immature Granulocytes: 0.05 10*3/uL (ref 0.00–0.07)
Basophils Absolute: 0 10*3/uL (ref 0.0–0.1)
Basophils Relative: 0 %
Eosinophils Absolute: 0.2 10*3/uL (ref 0.0–0.5)
Eosinophils Relative: 2 %
HCT: 43.2 % (ref 39.0–52.0)
Hemoglobin: 14.3 g/dL (ref 13.0–17.0)
Immature Granulocytes: 1 %
Lymphocytes Relative: 23 %
Lymphs Abs: 2.3 10*3/uL (ref 0.7–4.0)
MCH: 31.6 pg (ref 26.0–34.0)
MCHC: 33.1 g/dL (ref 30.0–36.0)
MCV: 95.4 fL (ref 80.0–100.0)
Monocytes Absolute: 0.5 10*3/uL (ref 0.1–1.0)
Monocytes Relative: 5 %
Neutro Abs: 7.1 10*3/uL (ref 1.7–7.7)
Neutrophils Relative %: 69 %
Platelets: 240 10*3/uL (ref 150–400)
RBC: 4.53 MIL/uL (ref 4.22–5.81)
RDW: 14.3 % (ref 11.5–15.5)
WBC: 10.1 10*3/uL (ref 4.0–10.5)
nRBC: 0 % (ref 0.0–0.2)

## 2019-01-13 LAB — COMPREHENSIVE METABOLIC PANEL
ALT: 21 U/L (ref 0–44)
AST: 17 U/L (ref 15–41)
Albumin: 4 g/dL (ref 3.5–5.0)
Alkaline Phosphatase: 102 U/L (ref 38–126)
Anion gap: 9 (ref 5–15)
BUN: 16 mg/dL (ref 8–23)
CO2: 27 mmol/L (ref 22–32)
Calcium: 9.1 mg/dL (ref 8.9–10.3)
Chloride: 101 mmol/L (ref 98–111)
Creatinine, Ser: 0.54 mg/dL — ABNORMAL LOW (ref 0.61–1.24)
GFR calc Af Amer: >60 mL/min (ref >60)
GFR calc non Af Amer: >60 mL/min (ref >60)
Glucose, Bld: 260 mg/dL — ABNORMAL HIGH (ref 70–99)
Potassium: 4 mmol/L (ref 3.5–5.1)
Sodium: 137 mmol/L (ref 135–145)
Total Bilirubin: 0.8 mg/dL (ref 0.3–1.2)
Total Protein: 6.9 g/dL (ref 6.5–8.1)

## 2019-01-13 MED ORDER — SODIUM CHLORIDE 0.9 % IV SOLN
Freq: Once | INTRAVENOUS | Status: AC
  Administered 2019-01-13: 11:00:00 via INTRAVENOUS
  Filled 2019-01-13: qty 5

## 2019-01-13 MED ORDER — DEXTROSE 5 % IV SOLN
Freq: Once | INTRAVENOUS | Status: AC
  Administered 2019-01-13: 10:00:00 via INTRAVENOUS

## 2019-01-13 MED ORDER — SODIUM CHLORIDE 0.9 % IV SOLN
150.0000 mg/m2 | Freq: Once | INTRAVENOUS | Status: AC
  Administered 2019-01-13: 280 mg via INTRAVENOUS
  Filled 2019-01-13: qty 10

## 2019-01-13 MED ORDER — GABAPENTIN 300 MG PO CAPS: 300.0000 mg | ORAL_CAPSULE | Freq: Two times a day (BID) | ORAL | 3 refills | Status: DC

## 2019-01-13 MED ORDER — SODIUM CHLORIDE 0.9 % IV SOLN
Freq: Once | INTRAVENOUS | Status: AC
  Administered 2019-01-13: 14:00:00 via INTRAVENOUS

## 2019-01-13 MED ORDER — PALONOSETRON HCL INJECTION 0.25 MG/5ML
0.2500 mg | Freq: Once | INTRAVENOUS | Status: AC
  Administered 2019-01-13: 0.25 mg via INTRAVENOUS
  Filled 2019-01-13: qty 5

## 2019-01-13 MED ORDER — SODIUM CHLORIDE 0.9 % IV SOLN
2400.0000 mg/m2 | INTRAVENOUS | Status: DC
  Administered 2019-01-13: 16:00:00 4450 mg via INTRAVENOUS
  Filled 2019-01-13: qty 89

## 2019-01-13 MED ORDER — SODIUM CHLORIDE 0.9% FLUSH
10.0000 mL | INTRAVENOUS | Status: DC | PRN
  Administered 2019-01-13: 10 mL
  Filled 2019-01-13: qty 10

## 2019-01-13 MED ORDER — ATROPINE SULFATE 1 MG/ML IJ SOLN
0.5000 mg | Freq: Once | INTRAMUSCULAR | Status: AC | PRN
  Administered 2019-01-13: 0.5 mg via INTRAVENOUS
  Filled 2019-01-13: qty 1

## 2019-01-13 MED ORDER — SODIUM CHLORIDE 0.9 % IV SOLN
376.0000 mg/m2 | Freq: Once | INTRAVENOUS | Status: AC
  Administered 2019-01-13: 700 mg via INTRAVENOUS
  Filled 2019-01-13: qty 35

## 2019-01-13 MED ORDER — OXALIPLATIN CHEMO INJECTION 100 MG/20ML
63.7500 mg/m2 | Freq: Once | INTRAVENOUS | Status: AC
  Administered 2019-01-13: 120 mg via INTRAVENOUS
  Filled 2019-01-13: qty 20

## 2019-01-13 NOTE — Patient Instructions (Signed)

## 2019-01-13 NOTE — Telephone Encounter (Signed)
Rx sent 

## 2019-01-13 NOTE — Telephone Encounter (Signed)
Mark Foster insurance will no longer cover Lantus. Would you rather him have Basaglar or Levemir which are onm formulary.

## 2019-01-13 NOTE — Progress Notes (Signed)
Pt presents today for Day 1 cycle 2. VSS. Pt has no complaints of any changes since the last visit. MAR reviewed. Pt has complaints of a small sore that has developed on the L gum line which he states he is using Biotene for.   Treatment given today per MD orders. Tolerated infusion without adverse affects. Vital signs stable. No complaints at this time. Discharged from clinic ambulatory. F/U with [facility] as scheduled.

## 2019-01-13 NOTE — Telephone Encounter (Signed)
I want him to stay on Basaglar

## 2019-01-15 ENCOUNTER — Other Ambulatory Visit: Payer: Self-pay

## 2019-01-15 ENCOUNTER — Inpatient Hospital Stay (HOSPITAL_COMMUNITY): Payer: PRIVATE HEALTH INSURANCE

## 2019-01-15 VITALS — BP 118/63 | HR 68 | Temp 97.6°F | Resp 18

## 2019-01-15 DIAGNOSIS — C259 Malignant neoplasm of pancreas, unspecified: Secondary | ICD-10-CM

## 2019-01-15 DIAGNOSIS — Z5111 Encounter for antineoplastic chemotherapy: Secondary | ICD-10-CM | POA: Diagnosis not present

## 2019-01-15 DIAGNOSIS — Z95828 Presence of other vascular implants and grafts: Secondary | ICD-10-CM

## 2019-01-15 MED ORDER — PEGFILGRASTIM INJECTION 6 MG/0.6ML ~~LOC~~
6.0000 mg | PREFILLED_SYRINGE | Freq: Once | SUBCUTANEOUS | Status: AC
  Administered 2019-01-15: 6 mg via SUBCUTANEOUS

## 2019-01-15 MED ORDER — HEPARIN SOD (PORK) LOCK FLUSH 100 UNIT/ML IV SOLN
500.0000 [IU] | Freq: Once | INTRAVENOUS | Status: AC | PRN
  Administered 2019-01-15: 15:00:00 500 [IU]

## 2019-01-15 MED ORDER — PEGFILGRASTIM INJECTION 6 MG/0.6ML ~~LOC~~
PREFILLED_SYRINGE | SUBCUTANEOUS | Status: AC
  Filled 2019-01-15: qty 0.6

## 2019-01-15 MED ORDER — SODIUM CHLORIDE 0.9% FLUSH
10.0000 mL | INTRAVENOUS | Status: DC | PRN
  Administered 2019-01-15: 10 mL
  Filled 2019-01-15: qty 10

## 2019-01-15 NOTE — Progress Notes (Signed)
Pump D/C today and Neulasta 6 mg injection administered in the L upper arm SQ. VSS. Pt has no complaints of any pain or changes since the last visit.

## 2019-01-15 NOTE — Patient Instructions (Signed)
[facility] Discharge Instructions for Patients Receiving Chemotherapy  Today you received the following chemotherapy agents   To help prevent nausea and vomiting after your treatment, we encourage you to take your nausea medication   If you develop nausea and vomiting that is not controlled by your nausea medication, call the clinic.   BELOW ARE SYMPTOMS THAT SHOULD BE REPORTED IMMEDIATELY:  *FEVER GREATER THAN 100.5 F  *CHILLS WITH OR WITHOUT FEVER  NAUSEA AND VOMITING THAT IS NOT CONTROLLED WITH YOUR NAUSEA MEDICATION  *UNUSUAL SHORTNESS OF BREATH  *UNUSUAL BRUISING OR BLEEDING  TENDERNESS IN MOUTH AND THROAT WITH OR WITHOUT PRESENCE OF ULCERS  *URINARY PROBLEMS  *BOWEL PROBLEMS  UNUSUAL RASH Items with * indicate a potential emergency and should be followed up as soon as possible.  Feel free to call the clinic should you have any questions or concerns. The clinic phone number is [phone].  Please show the CHEMO ALERT CARD at check-in to the Emergency Department and triage nurse.   

## 2019-01-22 ENCOUNTER — Ambulatory Visit (INDEPENDENT_AMBULATORY_CARE_PROVIDER_SITE_OTHER): Payer: PRIVATE HEALTH INSURANCE | Admitting: "Endocrinology

## 2019-01-22 ENCOUNTER — Encounter: Payer: Self-pay | Admitting: "Endocrinology

## 2019-01-22 ENCOUNTER — Other Ambulatory Visit: Payer: Self-pay

## 2019-01-22 DIAGNOSIS — E1165 Type 2 diabetes mellitus with hyperglycemia: Secondary | ICD-10-CM | POA: Diagnosis not present

## 2019-01-22 DIAGNOSIS — E782 Mixed hyperlipidemia: Secondary | ICD-10-CM | POA: Diagnosis not present

## 2019-01-22 DIAGNOSIS — I10 Essential (primary) hypertension: Secondary | ICD-10-CM | POA: Diagnosis not present

## 2019-01-22 MED ORDER — BASAGLAR KWIKPEN 100 UNIT/ML ~~LOC~~ SOPN: 30.0000 [IU] | PEN_INJECTOR | Freq: Every day | SUBCUTANEOUS | 2 refills | Status: DC

## 2019-01-22 NOTE — Progress Notes (Signed)
01/22/2019, 5:56 PM                                 Endocrinology Telehealth Visit Follow up Note -During COVID -19 Pandemic  I connected with Mark Foster on 01/22/2019   by telephone and verified that I am speaking with the correct person using two identifiers. Mark Foster, 01-27-1956. he has verbally consented to this visit. All issues noted in this document were discussed and addressed. The format was not optimal for physical exam.   Subjective:    Patient ID: Mark Foster, male    DOB: 09/16/1955.  Mark Foster is being engaged in telehealth in follow-up for management of currently uncontrolled symptomatic pancreatic diabetes, hypothyroidism, hyperlipidemia. PMD:  Doree Albee, MD.   Past Medical History:  Diagnosis Date  . Cervical compression fracture ([place])   . Cervical spinal stenosis   . Diabetic neuropathy (HCC)    Bilateral legs  . Diverticulitis   . Family history of pancreatic cancer   . History of kidney stones   . Hypothyroidism   . Pancreatic cancer ([place])   . Stenosis of left vertebral artery   . Stroke [facility])    2011  . Type 2 diabetes mellitus ([place])    Past Surgical History:  Procedure Laterality Date  . APPENDECTOMY    . COLON RESECTION     For diverticulitis  . COLON SURGERY    . ESOPHAGOGASTRODUODENOSCOPY N/A 10/03/2018   Procedure: ESOPHAGOGASTRODUODENOSCOPY (EGD);  Surgeon: [name], MD;  Location: [name];  Service: Endoscopy;  Laterality: N/A;  . EUS N/A 10/03/2018   Procedure: UPPER ENDOSCOPIC ULTRASOUND (EUS) RADIAL;  Surgeon: [name], MD;  Location: [facility];  Service: Endoscopy;  Laterality: N/A;  . FINE NEEDLE ASPIRATION N/A 10/03/2018   Procedure: FINE NEEDLE ASPIRATION (FNA) LINEAR;  Surgeon: [name], MD;  Location: [facility];  Service: Endoscopy;  Laterality: N/A;  . IR ANGIO INTRA EXTRACRAN SEL COM CAROTID INNOMINATE BILAT  MOD SED  01/21/2018  . IR ANGIO INTRA EXTRACRAN SEL COM CAROTID INNOMINATE UNI R MOD SED  03/21/2018  . IR ANGIO VERTEBRAL SEL VERTEBRAL BILAT MOD SED  01/21/2018  . IR TRANSCATH EXCRAN VERT OR CAR A STENT  03/21/2018  . PORTACATH PLACEMENT Left 12/23/2018   Procedure: INSERTION PORT-A-CATH;  Surgeon: Virl Cagey, MD;  Location: [facility];  Service: General;  Laterality: Left;  . RADIOLOGY WITH ANESTHESIA N/A 03/21/2018   Procedure: IR WITH ANESTHESIA WITH STENT PLACEMENT;  Surgeon: Luanne Bras, MD;  Location: [facility];  Service: Radiology;  Laterality: N/A;  . ROTATOR CUFF REPAIR     Left   Social History   Socioeconomic History  . Marital status: Single    Spouse name: Not on file  . Number of children: 2  . Years of education: Not on file  . Highest education level: Not on file  Occupational History  . Occupation: Estate agent  Social Needs  . Financial resource strain: Somewhat hard  . Food insecurity:    Worry: Never true  Inability: Never true  . Transportation needs:    Medical: No    Non-medical: No  Tobacco Use  . Smoking status: Never Smoker  . Smokeless tobacco: Never Used  Substance and Sexual Activity  . Alcohol use: Not Currently  . Drug use: Never  . Sexual activity: Not on file  Lifestyle  . Physical activity:    Days per week: 0 days    Minutes per session: 0 min  . Stress: To some extent  Relationships  . Social connections:    Talks on phone: More than three times a week    Gets together: Once a week    Attends religious service: More than 4 times per year    Active member of club or organization: No    Attends meetings of clubs or organizations: Never    Relationship status: Divorced  Other Topics Concern  . Not on file  Social History Narrative  . Not on file   Outpatient Encounter Medications as of 01/22/2019  Medication Sig  . aspirin 81 MG tablet Take 1 tablet (81 mg total) by mouth daily.  Marland Kitchen atorvastatin (LIPITOR) 40 MG tablet  Take 1 tablet (40 mg total) by mouth daily at 6 PM.  . Continuous Blood Gluc Sensor (FREESTYLE LIBRE 14 DAY SENSOR) MISC 1 Device by Does not apply route every 14 (fourteen) days.  Marland Kitchen dextrose 5 % SOLN 1,000 mL with fluorouracil 5 GM/100ML SOLN Inject into the vein over 48 hr.  . ECHINACEA EXTRACT PO Take 2 tablets by mouth daily.   Marland Kitchen FLUOROURACIL IV Inject into the vein every 14 (fourteen) days.  Marland Kitchen gabapentin (NEURONTIN) 300 MG capsule Take 1 capsule (300 mg total) by mouth 2 (two) times daily.  . Insulin Glargine (BASAGLAR KWIKPEN) 100 UNIT/ML SOPN Inject 0.3 mLs (30 Units total) into the skin at bedtime.  . Insulin Pen Needle 32G X 4 MM MISC 1 Device by Does not apply route daily.  . IRINOTECAN HCL IV Inject into the vein every 14 (fourteen) days.  Marland Kitchen LEUCOVORIN CALCIUM IV Inject into the vein every 14 (fourteen) days.  Marland Kitchen lidocaine-prilocaine (EMLA) cream Apply pea-sized amount to port-a-cath site and cover with plastic wrap one hour prior to chemotherapy appointments  . loperamide (IMODIUM A-D) 2 MG tablet Take 1 tablet (2 mg total) by mouth as needed. Take 2 at diarrhea onset , then 1 every 2hr until 12hrs with no BM. May take 2 every 4hrs at night. If diarrhea recurs repeat.  . NP THYROID 30 MG tablet Take 30 mg by mouth daily before breakfast.   . Omega-3 1000 MG CAPS Take 1,000 mg by mouth daily.  . OXALIPLATIN IV Inject into the vein every 14 (fourteen) days.  . pantoprazole (PROTONIX) 20 MG tablet Take 20 mg by mouth daily.   . prochlorperazine (COMPAZINE) 10 MG tablet Take 1 tablet (10 mg total) by mouth every 6 (six) hours as needed (NAUSEA).  Marland Kitchen sertraline (ZOLOFT) 50 MG tablet Take 50 mg by mouth every morning.   . tamsulosin (FLOMAX) 0.4 MG CAPS capsule Take 0.4 mg by mouth daily.   . ticagrelor (BRILINTA) 90 MG TABS tablet Take 1 tablet (90 mg total) by mouth 2 (two) times daily.  . vitamin B-12 (CYANOCOBALAMIN) 1000 MCG tablet Take 1,000 mcg by mouth daily.  . [DISCONTINUED]  Insulin Glargine (BASAGLAR KWIKPEN) 100 UNIT/ML SOPN Inject 30 Units into the skin at bedtime.  . [DISCONTINUED] Insulin Glargine (LANTUS SOLOSTAR) 100 UNIT/ML Solostar Pen Inject 24 Units  into the skin at bedtime.   Facility-Administered Encounter Medications as of 01/22/2019  Medication  . 0.9 %  sodium chloride infusion  . 0.9 %  sodium chloride infusion  . dextrose 5 % solution  . sodium chloride flush (NS) 0.9 % injection 10 mL    ALLERGIES: Allergies  Allergen Reactions  . Demerol [Meperidine Hcl] Other (See Comments)    convulsions    VACCINATION STATUS: Immunization History  Administered Date(s) Administered  . HiB (PRP-T) 10/23/2018  . Influenza-Unspecified 08/02/2018  . Meningococcal Mcv4o 10/23/2018  . Pneumococcal Conjugate-13 10/23/2018    Diabetes  He presents for his follow-up diabetic visit. He has type 1 diabetes mellitus. Onset time: He was diagnosed at approximate age of 74 years. His disease course has been worsening (He did have A1c of 9.7% in May 2019.  However, more recently he has documented controlled glycemic profile averaging 134 over the last 2 weeks.). There are no hypoglycemic associated symptoms. Pertinent negatives for hypoglycemia include no confusion, headaches, pallor or seizures. There are no diabetic associated symptoms. Pertinent negatives for diabetes include no chest pain, no fatigue, no polydipsia, no polyphagia, no polyuria and no weakness. There are no hypoglycemic complications. Symptoms are worsening. Diabetic complications include a CVA. Pertinent negatives for diabetic complications include no heart disease. (Awaiting stent placement in his carotid artery.) Risk factors for coronary artery disease include diabetes mellitus, dyslipidemia, hypertension, male sex and sedentary lifestyle. Current diabetic treatment includes insulin injections. He is following a generally unhealthy diet. When asked about meal planning, he reported none. He has not  had a previous visit with a dietitian. He rarely participates in exercise. His home blood glucose trend is increasing steadily. His breakfast blood glucose range is generally 180-200 mg/dl. His lunch blood glucose range is generally 180-200 mg/dl. His dinner blood glucose range is generally 180-200 mg/dl. His bedtime blood glucose range is generally 180-200 mg/dl. His overall blood glucose range is 180-200 mg/dl. An ACE inhibitor/angiotensin II receptor blocker is not being taken.  Hyperlipidemia  This is a chronic problem. The current episode started more than 1 year ago. The problem is uncontrolled. Exacerbating diseases include diabetes. Pertinent negatives include no chest pain, myalgias or shortness of breath. Current antihyperlipidemic treatment includes statins. Risk factors for coronary artery disease include diabetes mellitus, dyslipidemia, hypertension, male sex and a sedentary lifestyle.   Review of systems: Limited as above   Objective:    There were no vitals taken for this visit.  Wt Readings from Last 3 Encounters:  01/13/19 152 lb 6.4 oz (69.1 kg)  01/07/19 152 lb 11.2 oz (69.3 kg)  01/02/19 154 lb (69.9 kg)      CMP ( most recent) CMP     Component Value Date/Time   NA 137 01/13/2019 0854   K 4.0 01/13/2019 0854   CL 101 01/13/2019 0854   CO2 27 01/13/2019 0854   GLUCOSE 260 (H) 01/13/2019 0854   BUN 16 01/13/2019 0854   CREATININE 0.54 (L) 01/13/2019 0854   CREATININE 0.88 12/11/2018 1018   CALCIUM 9.1 01/13/2019 0854   PROT 6.9 01/13/2019 0854   ALBUMIN 4.0 01/13/2019 0854   AST 17 01/13/2019 0854   AST 16 12/11/2018 1018   ALT 21 01/13/2019 0854   ALT 19 12/11/2018 1018   ALKPHOS 102 01/13/2019 0854   BILITOT 0.8 01/13/2019 0854   BILITOT 0.8 12/11/2018 1018   GFRNONAA >60 01/13/2019 0854   GFRNONAA >60 12/11/2018 1018   GFRAA >60 01/13/2019 5638  GFRAA >60 12/11/2018 1018     Diabetic Labs (most recent): Lab Results  Component Value Date   HGBA1C  9.0 (H) 11/12/2018   HGBA1C 6.2 (H) 05/20/2018   HGBA1C 6.5 (H) 03/19/2018     Lipid Panel ( most recent) Lipid Panel     Component Value Date/Time   CHOL 168 01/20/2018 0247   TRIG 51 01/20/2018 0247   HDL 45 01/20/2018 0247   CHOLHDL 3.7 01/20/2018 0247   VLDL 10 01/20/2018 0247   LDLCALC 113 (H) 01/20/2018 0247    Assessment & Plan:   1. Uncontrolled type 2 diabetes mellitus with hyperglycemia (HCC)  - Mark Foster has history of uncontrolled diabetes with recent A1c of 9.%.   -more recently patient is diagnosed with pancreatic cancer currently in the middle of chemotherapy. He is on Basaglar 24 units nightly .  He reports that his blood glucose ranges from 148-256, no hypoglycemia.  He believed Lantus was more helpful than Basaglar, however his insurance did not provide coverage for Lantus.   -  Recent labs reviewed.  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 These are all discussed in detail with the patient.  - I have counseled him on diet management  by adopting a carbohydrate restricted/protein rich diet.  -  Suggestion is made for him to introduce more healthier, complex carbohydrates to his diet, as well as introduction of increased protein intake (animal or plant sources, fruits, and vegetables.    - he is advised to stick to a routine mealtimes to eat 3 meals  a day and avoid unnecessary snacks ( to snack only to correct hypoglycemia).    - I have approached him with the following individualized plan to manage diabetes and patient agrees:   -He is  not a suitable candidate for oral antidiabetic medications nor incretin therapy, hence he is advised to discontinue glipizide. -The #1 priority in the treatment of his diabetes will be to avoid hypoglycemia. -Given his pancreatic diabetes, even before he was diagnosed with pancreatic cancer, the best choice of therapy for his diabetes will be exclusively with insulin. -He is advised to increase Basaglar to 30 units nightly, continue to monitor blood glucose 4 times a day-daily before meals and at bedtime.    -Patient is encouraged to call clinic for blood glucose levels less than 70 or above 300 mg /dl.  - Patient specific target  A1c;  LDL, HDL, Triglycerides, and  Waist Circumference were discussed in detail. -He is anti-GAD antibodies, anti-islet cell antibodies are negative indicating his diabetes is not likely to be type I, likely related to his prior alcohol abuse causing pancreatic diabetes.  -The priority in this patient would be to avoid hypoglycemia due to lack of alpha cell  glucagon response on the background of alcohol induced diabetes.  2) BP/HTN: he is advised to home monitor blood pressure and report if > 140/90 on 2 separate readings.   Patient is not on ACE inhibitors nor ARB.  He will be considered for low-dose lisinopril on subsequent visits.    3) Lipids/HPL:   His recent labs show LDL of  113.  I will lower the dose of his Lipitor to 40 mg from 80 mg for subsequent refills.    4)  Weight/Diet: He does not have access weight to lose, CDE Consult will be initiated , exercise, and detailed carbohydrates information provided.  5) hypothyroidism -He is currently on NP thyroid 30 mg daily.  He will have repeat thyroid function test before his next visit.  He will be considered for levothyroxine treatment as necessary after his next visit.  6) Chronic Care/Health Maintenance:  -he  is on Statin medications and  is encouraged to continue to follow up with Ophthalmology, Dentist,   Podiatrist at least yearly or according to recommendations, and advised to  stay away from smoking. I have recommended yearly flu vaccine and pneumonia vaccination at least every 5 years;  and  sleep for at least 7 hours a day.  - I advised patient to maintain close follow up with Doree Albee, MD for primary care needs.   - Patient Care Time Today: 15 minutes. Mark Foster participated in the discussions, expressed understanding, and voiced agreement with the above plans.  All questions were answered to his satisfaction. he is encouraged to contact clinic should he have any questions or concerns prior to his return visit.  Follow up plan: - Return in about 4 weeks (around 02/19/2019) for Follow up with [facility] and Logs Only - no Labs.  Mark Lloyd, MD [facility] [address] [place], [place] [address] Phone: [phone]  Fax: [phone]    01/22/2019, 5:56 PM  This note was partially dictated with voice recognition software. Similar sounding words can be transcribed inadequately or may not  be corrected upon review.

## 2019-01-28 ENCOUNTER — Inpatient Hospital Stay (HOSPITAL_BASED_OUTPATIENT_CLINIC_OR_DEPARTMENT_OTHER): Payer: PRIVATE HEALTH INSURANCE | Admitting: Hematology

## 2019-01-28 ENCOUNTER — Other Ambulatory Visit: Payer: Self-pay

## 2019-01-28 ENCOUNTER — Encounter (HOSPITAL_COMMUNITY): Payer: Self-pay | Admitting: Hematology

## 2019-01-28 ENCOUNTER — Inpatient Hospital Stay (HOSPITAL_COMMUNITY): Payer: PRIVATE HEALTH INSURANCE

## 2019-01-28 VITALS — BP 128/54 | HR 72 | Temp 98.3°F | Resp 18

## 2019-01-28 DIAGNOSIS — Z95828 Presence of other vascular implants and grafts: Secondary | ICD-10-CM

## 2019-01-28 DIAGNOSIS — C259 Malignant neoplasm of pancreas, unspecified: Secondary | ICD-10-CM

## 2019-01-28 DIAGNOSIS — Z9081 Acquired absence of spleen: Secondary | ICD-10-CM

## 2019-01-28 DIAGNOSIS — R110 Nausea: Secondary | ICD-10-CM

## 2019-01-28 DIAGNOSIS — C251 Malignant neoplasm of body of pancreas: Secondary | ICD-10-CM | POA: Diagnosis not present

## 2019-01-28 DIAGNOSIS — Z87442 Personal history of urinary calculi: Secondary | ICD-10-CM

## 2019-01-28 DIAGNOSIS — Z794 Long term (current) use of insulin: Secondary | ICD-10-CM

## 2019-01-28 DIAGNOSIS — Z90411 Acquired partial absence of pancreas: Secondary | ICD-10-CM | POA: Diagnosis not present

## 2019-01-28 DIAGNOSIS — E1140 Type 2 diabetes mellitus with diabetic neuropathy, unspecified: Secondary | ICD-10-CM

## 2019-01-28 DIAGNOSIS — E039 Hypothyroidism, unspecified: Secondary | ICD-10-CM

## 2019-01-28 DIAGNOSIS — Z79899 Other long term (current) drug therapy: Secondary | ICD-10-CM

## 2019-01-28 DIAGNOSIS — Z7982 Long term (current) use of aspirin: Secondary | ICD-10-CM

## 2019-01-28 DIAGNOSIS — Z5111 Encounter for antineoplastic chemotherapy: Secondary | ICD-10-CM | POA: Diagnosis not present

## 2019-01-28 DIAGNOSIS — R5383 Other fatigue: Secondary | ICD-10-CM

## 2019-01-28 DIAGNOSIS — Z800 Family history of malignant neoplasm of digestive organs: Secondary | ICD-10-CM

## 2019-01-28 DIAGNOSIS — Z8673 Personal history of transient ischemic attack (TIA), and cerebral infarction without residual deficits: Secondary | ICD-10-CM

## 2019-01-28 LAB — COMPREHENSIVE METABOLIC PANEL
ALT: 20 U/L (ref 0–44)
AST: 19 U/L (ref 15–41)
Albumin: 3.7 g/dL (ref 3.5–5.0)
Alkaline Phosphatase: 124 U/L (ref 38–126)
Anion gap: 8 (ref 5–15)
BUN: 15 mg/dL (ref 8–23)
CO2: 25 mmol/L (ref 22–32)
Calcium: 8.5 mg/dL — ABNORMAL LOW (ref 8.9–10.3)
Chloride: 104 mmol/L (ref 98–111)
Creatinine, Ser: 0.58 mg/dL — ABNORMAL LOW (ref 0.61–1.24)
GFR calc Af Amer: >60 mL/min (ref >60)
GFR calc non Af Amer: >60 mL/min (ref >60)
Glucose, Bld: 292 mg/dL — ABNORMAL HIGH (ref 70–99)
Potassium: 3.6 mmol/L (ref 3.5–5.1)
Sodium: 137 mmol/L (ref 135–145)
Total Bilirubin: 0.5 mg/dL (ref 0.3–1.2)
Total Protein: 6.4 g/dL — ABNORMAL LOW (ref 6.5–8.1)

## 2019-01-28 LAB — CBC WITH DIFFERENTIAL/PLATELET
Abs Immature Granulocytes: 0.13 10*3/uL — ABNORMAL HIGH (ref 0.00–0.07)
Basophils Absolute: 0.1 10*3/uL (ref 0.0–0.1)
Basophils Relative: 0 %
Eosinophils Absolute: 0.1 10*3/uL (ref 0.0–0.5)
Eosinophils Relative: 1 %
HCT: 38.9 % — ABNORMAL LOW (ref 39.0–52.0)
Hemoglobin: 13 g/dL (ref 13.0–17.0)
Immature Granulocytes: 1 %
Lymphocytes Relative: 12 %
Lymphs Abs: 1.7 10*3/uL (ref 0.7–4.0)
MCH: 31.5 pg (ref 26.0–34.0)
MCHC: 33.4 g/dL (ref 30.0–36.0)
MCV: 94.2 fL (ref 80.0–100.0)
Monocytes Absolute: 0.9 10*3/uL (ref 0.1–1.0)
Monocytes Relative: 7 %
Neutro Abs: 11.1 10*3/uL — ABNORMAL HIGH (ref 1.7–7.7)
Neutrophils Relative %: 79 %
Platelets: 299 10*3/uL (ref 150–400)
RBC: 4.13 MIL/uL — ABNORMAL LOW (ref 4.22–5.81)
RDW: 15.6 % — ABNORMAL HIGH (ref 11.5–15.5)
WBC: 14 10*3/uL — ABNORMAL HIGH (ref 4.0–10.5)
nRBC: 0 % (ref 0.0–0.2)

## 2019-01-28 MED ORDER — SODIUM CHLORIDE 0.9 % IV SOLN
376.0000 mg/m2 | Freq: Once | INTRAVENOUS | Status: AC
  Administered 2019-01-28: 700 mg via INTRAVENOUS
  Filled 2019-01-28: qty 35

## 2019-01-28 MED ORDER — SODIUM CHLORIDE 0.9 % IV SOLN
INTRAVENOUS | Status: DC
  Administered 2019-01-28: 14:00:00 via INTRAVENOUS

## 2019-01-28 MED ORDER — ATROPINE SULFATE 1 MG/ML IJ SOLN
0.5000 mg | Freq: Once | INTRAMUSCULAR | Status: AC | PRN
  Administered 2019-01-28: 0.5 mg via INTRAVENOUS
  Filled 2019-01-28: qty 1

## 2019-01-28 MED ORDER — DEXTROSE 5 % IV SOLN
Freq: Once | INTRAVENOUS | Status: AC
  Administered 2019-01-28: 10:00:00 via INTRAVENOUS

## 2019-01-28 MED ORDER — PALONOSETRON HCL INJECTION 0.25 MG/5ML
0.2500 mg | Freq: Once | INTRAVENOUS | Status: AC
  Administered 2019-01-28: 0.25 mg via INTRAVENOUS
  Filled 2019-01-28: qty 5

## 2019-01-28 MED ORDER — SODIUM CHLORIDE 0.9% FLUSH
10.0000 mL | INTRAVENOUS | Status: DC | PRN
  Administered 2019-01-28: 10 mL
  Filled 2019-01-28: qty 10

## 2019-01-28 MED ORDER — OXALIPLATIN CHEMO INJECTION 100 MG/20ML
63.7500 mg/m2 | Freq: Once | INTRAVENOUS | Status: AC
  Administered 2019-01-28: 120 mg via INTRAVENOUS
  Filled 2019-01-28: qty 20

## 2019-01-28 MED ORDER — SODIUM CHLORIDE 0.9 % IV SOLN
2400.0000 mg/m2 | INTRAVENOUS | Status: DC
  Administered 2019-01-28: 4450 mg via INTRAVENOUS
  Filled 2019-01-28: qty 89

## 2019-01-28 MED ORDER — SODIUM CHLORIDE 0.9 % IV SOLN
Freq: Once | INTRAVENOUS | Status: AC
  Administered 2019-01-28: 10:00:00 via INTRAVENOUS
  Filled 2019-01-28: qty 5

## 2019-01-28 MED ORDER — SODIUM CHLORIDE 0.9 % IV SOLN
150.0000 mg/m2 | Freq: Once | INTRAVENOUS | Status: AC
  Administered 2019-01-28: 280 mg via INTRAVENOUS
  Filled 2019-01-28: qty 4

## 2019-01-28 NOTE — Progress Notes (Signed)
0930 Labs reviewed with and pt seen by Dr. Delton Coombes and pt approved for chemo tx today per MD                                                                                     Mark Foster tolerated chemo tx well without complaints or incident. Pt discharged with 5FU pump infusing without issues. VSS upon discharge. Pt discharged self ambulatory using his cane in satisfactory condition

## 2019-01-28 NOTE — Patient Instructions (Signed)
[facility] Discharge Instructions  Labs drawn from portacath today   Thank you for choosing [facility] at [facility] to provide your oncology and hematology care.  To afford each patient quality time with our provider, please arrive at least 15 minutes before your scheduled appointment time.   If you have a lab appointment with the Cancer Center please come in thru the  Main Entrance and check in at the main information desk  You need to re-schedule your appointment should you arrive 10 or more minutes late.  We strive to give you quality time with our providers, and arriving late affects you and other patients whose appointments are after yours.  Also, if you no show three or more times for appointments you may be dismissed from the clinic at the providers discretion.     Again, thank you for choosing [facility].  Our hope is that these requests will decrease the amount of time that you wait before being seen by our physicians.       _____________________________________________________________  Should you have questions after your visit to [facility], please contact our office at [phone] between the hours of 8:00 a.m. and 4:30 p.m.  Voicemails left after 4:00 p.m. will not be returned until the following business day.  For prescription refill requests, have your pharmacy contact our office and allow 72 hours.    Cancer Center Support Programs:   > Cancer Support Group  2nd Tuesday of the month 1pm-2pm, Journey Room   

## 2019-01-28 NOTE — Patient Instructions (Signed)
[facility] Discharge Instructions for Patients Receiving Chemotherapy   Beginning January 23rd 2017 lab work for the Cancer Center will be done in the  Main lab at [facility] on 1st floor. If you have a lab appointment with the Cancer Center please come in thru the  Main Entrance and check in at the main information desk   Today you received the following chemotherapy agents Oxaliplatin,Irinotecan,Leucovorin and 5FU. Follow-up as scheduled. Call clinic for any questions or concerns  To help prevent nausea and vomiting after your treatment, we encourage you to take your nausea medication   If you develop nausea and vomiting, or diarrhea that is not controlled by your medication, call the clinic.  The clinic phone number is [phone]. Office hours are Monday-Friday 8:30am-5:00pm.  BELOW ARE SYMPTOMS THAT SHOULD BE REPORTED IMMEDIATELY:  *FEVER GREATER THAN 101.0 F  *CHILLS WITH OR WITHOUT FEVER  NAUSEA AND VOMITING THAT IS NOT CONTROLLED WITH YOUR NAUSEA MEDICATION  *UNUSUAL SHORTNESS OF BREATH  *UNUSUAL BRUISING OR BLEEDING  TENDERNESS IN MOUTH AND THROAT WITH OR WITHOUT PRESENCE OF ULCERS  *URINARY PROBLEMS  *BOWEL PROBLEMS  UNUSUAL RASH Items with * indicate a potential emergency and should be followed up as soon as possible. If you have an emergency after office hours please contact your primary care physician or go to the nearest emergency department.  Please call the clinic during office hours if you have any questions or concerns.   You may also contact the Patient Navigator at [phone] should you have any questions or need assistance in obtaining follow up care.      Resources For Cancer Patients and their Caregivers ? American Cancer Society: Can assist with transportation, wigs, general needs, runs Look Good Feel Better.        1-888-227-6333 ? Cancer Care: Provides financial assistance, online support groups, medication/co-pay  assistance.  1-800-813-HOPE (4673) ? [facility] Assists [place] cancer patients and their families through emotional , educational and financial support.  336-427-4357 ? [place] DSS Where to apply for food stamps, Medicaid and utility assistance. 336-342-1394 ? [facility]: Transportation to medical appointments. 336-347-2287 ? Social Security Administration: May apply for disability if have a Stage IV cancer. 336-342-7796 1-800-772-1213 ? [place] Aging, Disability and Transit Services: Assists with nutrition, care and transit needs. 336-349-2343         

## 2019-01-28 NOTE — Assessment & Plan Note (Addendum)
1.  Stage IIb (T2N1) pancreatic adenocarcinoma: -Status post distal pancreatectomy and splenectomy at [facility] by Dr. Zenia Resides on 11/20/2018. - Genetic testing shows BRCA1 heterozygous VUS - As he did not receive any neoadjuvant chemotherapy, I have recommended 6 months of adjuvant chemotherapy. -We reviewed the 2 common regimens including modified FOLFIRINOX, and the gemcitabine/gemcitabine. -After thorough discussion of risks and benefits of each regimen, he has opted to try modified FOLFIRINOX. -He has a port placed.  He will proceed with his cycle 1 today.  I will cut back on the dose of oxaliplatin by 25% as he has questionable signs of neuropathy in the feet.  He denies any tingling or numbness burning type of pain. -CT CAP from 12/27/2018 shows postoperative changes of stable pancreatectomy and splenectomy with postoperative fluid collection in the body of the pancreas adjacent to the resection line, measuring 3 x 3.4 cm, likely postoperative pseudocyst.  No evidence of metastatic disease.  Postoperative CEA was 20. -2 cycles of FOLFIRINOX (oxaliplatin dose reduced) on 12/30/2018 and 01/13/2019. -He felt weak during the first week of treatment.  Neuropathy in the hands and feet has been stable and has not worsened since start of therapy. -We reviewed his blood work.  Glucose is 292.  We will proceed with cycle 3. -She will be seen back in 2 weeks for follow-up.  2.  Postsplenectomy state: -He did receive vaccinations prior to splenectomy. -We will follow-up on booster  schedule.  3.  Diabetes: -He is closely following up with Dr. Dorris Fetch. -His Bystolic was increased to 30 units daily.

## 2019-01-28 NOTE — Progress Notes (Signed)
[place],  [address]   CLINIC:  Medical Oncology/Hematology  PCP:  Mark Albee, MD [place] [address] [phone]   REASON FOR VISIT:  Follow-up for pancreatic cancer      BRIEF ONCOLOGIC HISTORY:    Pancreatic adenocarcinoma ([place])   08/20/2018 Imaging    CT abdomen/pelvis w/ contrast: IMPRESSION: Atrophy and ductal dilatation involving the pancreatic tail, with suspected small soft tissue mass in the pancreatic body which could represent pancreatic carcinoma. Abdomen MRI and MRCP without and with contrast is recommended for further evaluation.  No evidence of hepatobiliary disease.    08/30/2018 Imaging    MRI abdomen w/ contrast: IMPRESSION: 1. Although not definitive, there remains concern of a small hypoenhancing mass at the junction of the pancreatic body and tail associated with atrophy and ductal dilatation in the pancreatic tail. This remains concerning for pancreatic neoplasm. Postinflammatory stricture less likely. Besides a tiny cystic lesion in the pancreatic tail, there are no other signs of previous pancreatitis. Further evaluation with endoscopic ultrasound for possible biopsy strongly recommended. 2. No evidence of metastatic disease. 3. Cholelithiasis without evidence of cholecystitis or biliary dilatation.    09/03/2018 Initial Diagnosis    Pancreatic adenocarcinoma ([place])    10/03/2018 Procedure    EUS: - 2.6cm irregularly shaped mass in the body of the pancreas that causing main pancreatic duct obstruction and dilation. The mass abuts the splenic vessels but no other significant vascular structures and it was sampled with trangastric EUS FNA. Preliminary cytology is + for malignancy, likely well-differentiated adenocarcinoma. It appears surgically resectable.    10/03/2018 Pathology Results    Accession: [id number]  FINE NEEDLE ASPIRATION, ENDOSCOPIC, PANCREAS BODY  (SPECIMEN 1 OF 1 COLLECTED 10/03/18): ADENOCARCINOMA.    10/17/2018 Imaging    PET: IMPRESSION: 1. Tiny focus of hypermetabolism identified in the body of pancreas, adjacent to the abrupt cut off of the main pancreatic duct. No evidence for hypermetabolic metastatic disease in the neck, chest, abdomen, or pelvis. 2. Cholelithiasis. 3.  Aortic Atherosclerois (ICD10-170.0) 4. Prostatomegaly    11/12/2018 Genetic Testing    BRCA1 VUS identified on the common hereditary cancer panel.  The Hereditary Gene Panel offered by [facility] includes sequencing and/or deletion duplication testing of the following 47 genes: APC, ATM, AXIN2, BARD1, BMPR1A, BRCA1, BRCA2, BRIP1, CDH1, CDK4, CDKN2A (p14ARF), CDKN2A (p16INK4a), CHEK2, CTNNA1, DICER1, EPCAM (Deletion/duplication testing only), GREM1 (promoter region deletion/duplication testing only), KIT, MEN1, MLH1, MSH2, MSH3, MSH6, MUTYH, NBN, NF1, NHTL1, PALB2, PDGFRA, PMS2, POLD1, POLE, PTEN, RAD50, RAD51C, RAD51D, SDHB, SDHC, SDHD, SMAD4, SMARCA4. STK11, TP53, TSC1, TSC2, and VHL.  The following genes were evaluated for sequence changes only: SDHA and HOXB13 c.251G>A variant only. The report date is November 12, 2018.    11/20/2018 Surgery    Distal subtotal pancreatectomy with splenectomy at [facility]    11/20/2018 Surgery    TUMOR   Tumor Site:  Pancreatic body    Histologic Type:  Ductal adenocarcinoma    Histologic Grade:  G1: Well differentiated    Tumor Size:  Greatest dimension in Centimeters (cm): 2.8 Centimeters (cm)    Additional Dimension in Centimeters (cm):  2.7 Centimeters (cm)    Additional Dimension in Centimeters (cm):  1.8 Centimeters (cm)   Tumor Extent:      Tumor Extension:  Tumor is confined to pancreas    Accessory Findings:      Treatment Effect:  No known presurgical therapy  Lymphovascular Invasion:  Not identified     Perineural Invasion:  Not identified   MARGINS   Margins:       Proximal Pancreatic Parenchymal Margin:  Uninvolved by invasive carcinoma and pancreatic high-grade intraepithelial neoplasia      Distance of Invasive Carcinoma from Margin:  0.6 Centimeters (cm)   :      Other Margin:  Splenic margin.      Margin Status:  Uninvolved by invasive carcinoma   LYMPH NODES  Number of Lymph Nodes Involved:  2   Number of Lymph Nodes Examined:  16   PATHOLOGIC STAGE CLASSIFICATION (pTNM, AJCC 8th Edition)  TNM Descriptors:  Not applicable   Primary Tumor (pT):  pT2   Regional Lymph Nodes (pN):  pN1   ADDITIONAL FINDINGS  Additional Pathologic Findings:  Pancreatic intraepithelial neoplasia    Highest Grade (PanIN):  High grade PanIN is present.   Additional Pathologic Findings:  Benign unilocular pancreatic cyst (1.3 cm in greatest dimension) at the pancreatic tail.    12/30/2018 -  Chemotherapy    The patient had palonosetron (ALOXI) injection 0.25 mg, 0.25 mg, Intravenous,  Once, 3 of 12 cycles Administration: 0.25 mg (12/30/2018), 0.25 mg (01/13/2019) pegfilgrastim (NEULASTA) injection 6 mg, 6 mg, Subcutaneous, Once, 3 of 12 cycles Administration: 6 mg (01/01/2019), 6 mg (01/15/2019) irinotecan (CAMPTOSAR) 280 mg in sodium chloride 0.9 % 500 mL chemo infusion, 150 mg/m2 = 280 mg (100 % of original dose 150 mg/m2), Intravenous,  Once, 3 of 12 cycles Dose modification: 150 mg/m2 (original dose 150 mg/m2, Cycle 1, Reason: Provider Judgment) Administration: 280 mg (12/30/2018), 280 mg (01/13/2019) leucovorin 700 mg in sodium chloride 0.9 % 250 mL infusion, 744 mg, Intravenous,  Once, 3 of 12 cycles Administration: 700 mg (12/30/2018), 700 mg (01/13/2019) oxaliplatin (ELOXATIN) 120 mg in dextrose 5 % 500 mL chemo infusion, 63.75 mg/m2 = 120 mg (75 % of original dose 85 mg/m2), Intravenous,  Once, 3 of 12 cycles Dose modification: 63.75 mg/m2 (75 % of original dose 85 mg/m2, Cycle 1, Reason: Other (see comments), Comment: high risk  for worsening neuropathy) Administration: 120 mg (12/30/2018), 120 mg (01/13/2019) fosaprepitant (EMEND) 150 mg, dexamethasone (DECADRON) 12 mg in sodium chloride 0.9 % 145 mL IVPB, , Intravenous,  Once, 3 of 12 cycles Administration:  (12/30/2018),  (01/13/2019) fluorouracil (ADRUCIL) 4,450 mg in sodium chloride 0.9 % 61 mL chemo infusion, 2,400 mg/m2 = 4,450 mg, Intravenous, 1 Day/Dose, 3 of 12 cycles Administration: 4,450 mg (12/30/2018), 4,450 mg (01/13/2019)  for chemotherapy treatment.       CANCER STAGING: Cancer Staging No matching staging information was found for the patient.   INTERVAL HISTORY:  Mark Foster 63 y.o. male returns for recall 3 of chemotherapy.  After cycle 2 he had some nausea which was relieved with Compazine.  Denied any vomiting.  Denied any diarrhea or abdominal cramping.  Neuropathy in the hands and feet has been stable since the start of therapy.  Denies any fevers or infections.  No new onset cough was reported.  He felt tired during the week of treatment which improved during the second week.  His insulin dose was also increased by Dr. Dorris Fetch.  Appetite and energy levels are reported at 50%.  REVIEW OF SYSTEMS:  Review of Systems  Constitutional: Positive for fatigue.  Neurological: Positive for numbness.  All other systems reviewed and are negative.    PAST MEDICAL/SURGICAL HISTORY:  Past Medical History:  Diagnosis Date   Cervical compression fracture ([place])  Cervical spinal stenosis    Diabetic neuropathy (H[facility])    Bilateral legs   Diverticulitis    Family history of pancreatic cancer    History of kidney stones    Hypothyroidism    Pancreatic cancer ([place])    Stenosis of left vertebral artery    Stroke [facility])    2011   Type 2 diabetes mellitus ([place])    Past Surgical History:  Procedure Laterality Date   APPENDECTOMY     COLON RESECTION     For diverticulitis   COLON SURGERY     ESOPHAGOGASTRODUODENOSCOPY N/A 10/03/2018    Procedure: ESOPHAGOGASTRODUODENOSCOPY (EGD);  Surgeon: [name], MD;  Location: [name];  Service: Endoscopy;  Laterality: N/A;   EUS N/A 10/03/2018   Procedure: UPPER ENDOSCOPIC ULTRASOUND (EUS) RADIAL;  Surgeon: [name], MD;  Location: [facility];  Service: Endoscopy;  Laterality: N/A;   FINE NEEDLE ASPIRATION N/A 10/03/2018   Procedure: FINE NEEDLE ASPIRATION (FNA) LINEAR;  Surgeon: [name], MD;  Location: [facility];  Service: Endoscopy;  Laterality: N/A;   IR ANGIO INTRA EXTRACRAN SEL COM CAROTID INNOMINATE BILAT MOD SED  01/21/2018   IR ANGIO INTRA EXTRACRAN SEL COM CAROTID INNOMINATE UNI R MOD SED  03/21/2018   IR ANGIO VERTEBRAL SEL VERTEBRAL BILAT MOD SED  01/21/2018   IR TRANSCATH EXCRAN VERT OR CAR A STENT  03/21/2018   PORTACATH PLACEMENT Left 12/23/2018   Procedure: INSERTION PORT-A-CATH;  Surgeon: Virl Cagey, MD;  Location: [facility];  Service: General;  Laterality: Left;   RADIOLOGY WITH ANESTHESIA N/A 03/21/2018   Procedure: IR WITH ANESTHESIA WITH STENT PLACEMENT;  Surgeon: Luanne Bras, MD;  Location: [place];  Service: Radiology;  Laterality: N/A;   ROTATOR CUFF REPAIR     Left     SOCIAL HISTORY:  Social History   Socioeconomic History   Marital status: Single    Spouse name: Not on file   Number of children: 2   Years of education: Not on file   Highest education level: Not on file  Occupational History   Occupation: Estate agent  Social Needs   Financial resource strain: Somewhat hard   Food insecurity:    Worry: Never true    Inability: Never true   Transportation needs:    Medical: No    Non-medical: No  Tobacco Use   Smoking status: Never Smoker   Smokeless tobacco: Never Used  Substance and Sexual Activity   Alcohol use: Not Currently   Drug use: Never   Sexual activity: Not on file  Lifestyle   Physical activity:    Days per week: 0 days    Minutes per session: 0 min   Stress: To  some extent  Relationships   Social connections:    Talks on phone: More than three times a week    Gets together: Once a week    Attends religious service: More than 4 times per year    Active member of club or organization: No    Attends meetings of clubs or organizations: Never    Relationship status: Divorced   Intimate partner violence:    Fear of current or ex partner: No    Emotionally abused: No    Physically abused: No    Forced sexual activity: No  Other Topics Concern   Not on file  Social History Narrative   Not on file    FAMILY HISTORY:  Family History  Problem Relation Age of Onset  Stroke Mother    Pancreatic cancer Father 65       d. 70   Stroke Maternal Grandmother    Heart attack Maternal Grandfather    Heart Problems Paternal Grandfather    Scoliosis Daughter    Muscular dystrophy Grandson     CURRENT MEDICATIONS:  Outpatient Encounter Medications as of 01/28/2019  Medication Sig   aspirin 81 MG tablet Take 1 tablet (81 mg total) by mouth daily.   atorvastatin (LIPITOR) 40 MG tablet Take 1 tablet (40 mg total) by mouth daily at 6 PM.   Continuous Blood Gluc Sensor (FREESTYLE LIBRE 14 DAY SENSOR) MISC 1 Device by Does not apply route every 14 (fourteen) days.   dextrose 5 % SOLN 1,000 mL with fluorouracil 5 GM/100ML SOLN Inject into the vein over 48 hr.   ECHINACEA EXTRACT PO Take 2 tablets by mouth daily.    FLUOROURACIL IV Inject into the vein every 14 (fourteen) days.   gabapentin (NEURONTIN) 300 MG capsule Take 1 capsule (300 mg total) by mouth 2 (two) times daily.   Insulin Glargine (BASAGLAR KWIKPEN) 100 UNIT/ML SOPN Inject 0.3 mLs (30 Units total) into the skin at bedtime.   Insulin Pen Needle 32G X 4 MM MISC 1 Device by Does not apply route daily.   IRINOTECAN HCL IV Inject into the vein every 14 (fourteen) days.   LEUCOVORIN CALCIUM IV Inject into the vein every 14 (fourteen) days.   lidocaine-prilocaine (EMLA) cream  Apply pea-sized amount to port-a-cath site and cover with plastic wrap one hour prior to chemotherapy appointments   loperamide (IMODIUM A-D) 2 MG tablet Take 1 tablet (2 mg total) by mouth as needed. Take 2 at diarrhea onset , then 1 every 2hr until 12hrs with no BM. May take 2 every 4hrs at night. If diarrhea recurs repeat.   NP THYROID 30 MG tablet Take 30 mg by mouth daily before breakfast.    Omega-3 1000 MG CAPS Take 1,000 mg by mouth daily.   OXALIPLATIN IV Inject into the vein every 14 (fourteen) days.   pantoprazole (PROTONIX) 20 MG tablet Take 20 mg by mouth daily.    prochlorperazine (COMPAZINE) 10 MG tablet Take 1 tablet (10 mg total) by mouth every 6 (six) hours as needed (NAUSEA).   sertraline (ZOLOFT) 50 MG tablet Take 50 mg by mouth every morning.    tamsulosin (FLOMAX) 0.4 MG CAPS capsule Take 0.4 mg by mouth daily.    ticagrelor (BRILINTA) 90 MG TABS tablet Take 1 tablet (90 mg total) by mouth 2 (two) times daily.   vitamin B-12 (CYANOCOBALAMIN) 1000 MCG tablet Take 1,000 mcg by mouth daily.   [DISCONTINUED] atorvastatin (LIPITOR) 80 MG tablet TK 1 T PO D AT 6 PM   Facility-Administered Encounter Medications as of 01/28/2019  Medication   0.9 %  sodium chloride infusion   0.9 %  sodium chloride infusion   dextrose 5 % solution   sodium chloride flush (NS) 0.9 % injection 10 mL    ALLERGIES:  Allergies  Allergen Reactions   Demerol [Meperidine Hcl] Other (See Comments)    convulsions     PHYSICAL EXAM:  ECOG Performance status: 1  Vitals:   01/28/19 0834 01/28/19 0849  BP: 105/65 105/65  Pulse: 89 89  Resp: 18 18  Temp: 98.2 F (36.8 C) 98.2 F (36.8 C)  SpO2: 97% 97%   Filed Weights   01/28/19 0849  Weight: 152 lb 9.6 oz (69.2 kg)    Physical Exam  Vitals signs reviewed.  Constitutional:      Appearance: Normal appearance.  Cardiovascular:     Rate and Rhythm: Normal rate and regular rhythm.     Heart sounds: Normal heart sounds.    Pulmonary:     Effort: Pulmonary effort is normal.     Breath sounds: Normal breath sounds.  Abdominal:     General: There is no distension.     Palpations: Abdomen is soft. There is no mass.  Musculoskeletal:        General: No swelling.  Skin:    General: Skin is warm.  Neurological:     General: No focal deficit present.     Mental Status: He is alert and oriented to person, place, and time.  Psychiatric:        Mood and Affect: Mood normal.        Behavior: Behavior normal.      LABORATORY DATA:  I have reviewed the labs as listed.  CBC    Component Value Date/Time   WBC 14.0 (H) 01/28/2019 0837   RBC 4.13 (L) 01/28/2019 0837   HGB 13.0 01/28/2019 0837   HGB 14.9 12/11/2018 1018   HCT 38.9 (L) 01/28/2019 0837   PLT 299 01/28/2019 0837   PLT 335 12/11/2018 1018   MCV 94.2 01/28/2019 0837   MCH 31.5 01/28/2019 0837   MCHC 33.4 01/28/2019 0837   RDW 15.6 (H) 01/28/2019 0837   LYMPHSABS 1.7 01/28/2019 0837   MONOABS 0.9 01/28/2019 0837   EOSABS 0.1 01/28/2019 0837   BASOSABS 0.1 01/28/2019 0837   CMP Latest Ref Rng & Units 01/28/2019 01/13/2019 01/07/2019  Glucose 70 - 99 mg/dL 292(H) 260(H) 261(H)  BUN 8 - 23 mg/dL _0 Creatinine 0.61 - 1.24 mg/dL 0.58(L) 0.54(L) 0.64  Sodium 135 - 145 mmol/L 137 137 135  Potassium 3.5 - 5.1 mmol/L 3.6 4.0 4.0  Chloride 98 - 111 mmol/L 104 101 100  CO2 22 - 32 mmol/L _1 Calcium 8.9 - 10.3 mg/dL 8.5(L) 9.1 8.9  Total Protein 6.5 - 8.1 g/dL 6.4(L) 6.9 6.9  Total Bilirubin 0.3 - 1.2 mg/dL 0.5 0.8 0.7  Alkaline Phos 38 - 126 U/L 124 102 76  AST 15 - 41 U/L 19 17 13(L)  ALT 0 - 44 U/L _2 DIAGNOSTIC IMAGING:  I have independently reviewed the scans and discussed with the patient.      ASSESSMENT & PLAN:   Pancreatic adenocarcinoma (HCC) 1.  Stage IIb (T2N1) pancreatic adenocarcinoma: -Status post distal pancreatectomy and splenectomy at [facility] by Dr. Zenia Resides on 11/20/2018. - Genetic testing shows  BRCA1 heterozygous VUS - As he did not receive any neoadjuvant chemotherapy, I have recommended 6 months of adjuvant chemotherapy. -We reviewed the 2 common regimens including modified FOLFIRINOX, and the gemcitabine/gemcitabine. -After thorough discussion of risks and benefits of each regimen, he has opted to try modified FOLFIRINOX. -He has a port placed.  He will proceed with his cycle 1 today.  I will cut back on the dose of oxaliplatin by 25% as he has questionable signs of neuropathy in the feet.  He denies any tingling or numbness burning type of pain. -CT CAP from 12/27/2018 shows postoperative changes of stable pancreatectomy and splenectomy with postoperative fluid collection in the body of the pancreas adjacent to the resection line, measuring 3 x 3.4 cm, likely postoperative pseudocyst.  No evidence of metastatic disease.  Postoperative CEA was  20. -2 cycles of FOLFIRINOX (oxaliplatin dose reduced) on 12/30/2018 and 01/13/2019. -He felt weak during the first week of treatment.  Neuropathy in the hands and feet has been stable and has not worsened since start of therapy. -We reviewed his blood work.  Glucose is 292.  We will proceed with cycle 3. -She will be seen back in 2 weeks for follow-up.  2.  Postsplenectomy state: -He did receive vaccinations prior to splenectomy. -We will follow-up on booster  schedule.  3.  Diabetes: -He is closely following up with Dr. Dorris Fetch. -His Bystolic was increased to 30 units daily.    Total time spent is 25 minutes with more than 50% of the time spent face-to-face discussing and reinforcing treatment plan and coordination of care.  Orders placed this encounter:  No orders of the defined types were placed in this encounter.     Derek Jack, MD [place] [phone]

## 2019-01-30 ENCOUNTER — Other Ambulatory Visit: Payer: Self-pay

## 2019-01-30 ENCOUNTER — Encounter (HOSPITAL_COMMUNITY): Payer: Self-pay

## 2019-01-30 ENCOUNTER — Inpatient Hospital Stay (HOSPITAL_COMMUNITY): Payer: PRIVATE HEALTH INSURANCE

## 2019-01-30 VITALS — BP 117/62 | HR 77 | Temp 98.3°F | Resp 18

## 2019-01-30 DIAGNOSIS — Z95828 Presence of other vascular implants and grafts: Secondary | ICD-10-CM

## 2019-01-30 DIAGNOSIS — C259 Malignant neoplasm of pancreas, unspecified: Secondary | ICD-10-CM

## 2019-01-30 DIAGNOSIS — Z5111 Encounter for antineoplastic chemotherapy: Secondary | ICD-10-CM | POA: Diagnosis not present

## 2019-01-30 MED ORDER — PEGFILGRASTIM INJECTION 6 MG/0.6ML ~~LOC~~
6.0000 mg | PREFILLED_SYRINGE | Freq: Once | SUBCUTANEOUS | Status: AC
  Administered 2019-01-30: 6 mg via SUBCUTANEOUS

## 2019-01-30 MED ORDER — PEGFILGRASTIM INJECTION 6 MG/0.6ML ~~LOC~~
PREFILLED_SYRINGE | SUBCUTANEOUS | Status: AC
  Filled 2019-01-30: qty 0.6

## 2019-01-30 MED ORDER — SODIUM CHLORIDE 0.9% FLUSH
10.0000 mL | INTRAVENOUS | Status: DC | PRN
  Administered 2019-01-30: 10 mL
  Filled 2019-01-30: qty 10

## 2019-01-30 MED ORDER — HEPARIN SOD (PORK) LOCK FLUSH 100 UNIT/ML IV SOLN
500.0000 [IU] | Freq: Once | INTRAVENOUS | Status: AC | PRN
  Administered 2019-01-30: 500 [IU]

## 2019-01-30 NOTE — Patient Instructions (Signed)
[place] at [facility] Discharge Instructions  5FU pump discontinued with portacath flushed today as well as Neulasta injection given. Follow-up as scheduled. Call clinic for any questions or concerns   Thank you for choosing [place] at [facility] to provide your oncology and hematology care.  To afford each patient quality time with our provider, please arrive at least 15 minutes before your scheduled appointment time.   If you have a lab appointment with the [place] please come in thru the  Main Entrance and check in at the main information desk  You need to re-schedule your appointment should you arrive 10 or more minutes late.  We strive to give you quality time with our providers, and arriving late affects you and other patients whose appointments are after yours.  Also, if you no show three or more times for appointments you may be dismissed from the clinic at the providers discretion.     Again, thank you for choosing [facility].  Our hope is that these requests will decrease the amount of time that you wait before being seen by our physicians.       _____________________________________________________________  Should you have questions after your visit to [facility], please contact our office at [phone] between the hours of 8:00 a.m. and 4:30 p.m.  Voicemails left after 4:00 p.m. will not be returned until the following business day.  For prescription refill requests, have your pharmacy contact our office and allow 72 hours.    Cancer Center Support Programs:   > Cancer Support Group  2nd Tuesday of the month 1pm-2pm, Journey Room

## 2019-01-30 NOTE — Progress Notes (Signed)
Mark Foster tolerated 5FU pump well without complaints or incident.5FU pump discontinued with portacath flushed easily per protocol then de-accessed. Pt also tolerated Neulasta injection well. VSS upon discharge. Pt discharged self ambulatory using his cane in satisfactory condition

## 2019-01-31 ENCOUNTER — Ambulatory Visit: Payer: PRIVATE HEALTH INSURANCE | Admitting: "Endocrinology

## 2019-02-05 ENCOUNTER — Telehealth (HOSPITAL_COMMUNITY): Payer: Self-pay

## 2019-02-05 NOTE — Telephone Encounter (Signed)
L/m requested pt call us back to schedule or this referral will be closed on 02/19/2019. Advised pt he can get another referral when he is ready to schedule.

## 2019-02-10 ENCOUNTER — Other Ambulatory Visit: Payer: Self-pay

## 2019-02-11 ENCOUNTER — Inpatient Hospital Stay (HOSPITAL_BASED_OUTPATIENT_CLINIC_OR_DEPARTMENT_OTHER): Payer: PRIVATE HEALTH INSURANCE | Admitting: Hematology

## 2019-02-11 ENCOUNTER — Inpatient Hospital Stay (HOSPITAL_COMMUNITY): Payer: PRIVATE HEALTH INSURANCE | Attending: Hematology

## 2019-02-11 ENCOUNTER — Inpatient Hospital Stay (HOSPITAL_COMMUNITY): Payer: PRIVATE HEALTH INSURANCE

## 2019-02-11 ENCOUNTER — Encounter (HOSPITAL_COMMUNITY): Payer: Self-pay | Admitting: Hematology

## 2019-02-11 VITALS — BP 114/61 | HR 80 | Temp 97.8°F | Resp 18

## 2019-02-11 DIAGNOSIS — Z9081 Acquired absence of spleen: Secondary | ICD-10-CM | POA: Insufficient documentation

## 2019-02-11 DIAGNOSIS — C259 Malignant neoplasm of pancreas, unspecified: Secondary | ICD-10-CM

## 2019-02-11 DIAGNOSIS — E1140 Type 2 diabetes mellitus with diabetic neuropathy, unspecified: Secondary | ICD-10-CM | POA: Insufficient documentation

## 2019-02-11 DIAGNOSIS — Z794 Long term (current) use of insulin: Secondary | ICD-10-CM | POA: Diagnosis not present

## 2019-02-11 DIAGNOSIS — Z8673 Personal history of transient ischemic attack (TIA), and cerebral infarction without residual deficits: Secondary | ICD-10-CM

## 2019-02-11 DIAGNOSIS — Z5111 Encounter for antineoplastic chemotherapy: Secondary | ICD-10-CM | POA: Diagnosis not present

## 2019-02-11 DIAGNOSIS — C253 Malignant neoplasm of pancreatic duct: Secondary | ICD-10-CM | POA: Insufficient documentation

## 2019-02-11 DIAGNOSIS — Z7982 Long term (current) use of aspirin: Secondary | ICD-10-CM | POA: Diagnosis not present

## 2019-02-11 DIAGNOSIS — E039 Hypothyroidism, unspecified: Secondary | ICD-10-CM | POA: Insufficient documentation

## 2019-02-11 DIAGNOSIS — Z982 Presence of cerebrospinal fluid drainage device: Secondary | ICD-10-CM

## 2019-02-11 DIAGNOSIS — Z95828 Presence of other vascular implants and grafts: Secondary | ICD-10-CM

## 2019-02-11 DIAGNOSIS — Z79899 Other long term (current) drug therapy: Secondary | ICD-10-CM | POA: Diagnosis not present

## 2019-02-11 DIAGNOSIS — Z5189 Encounter for other specified aftercare: Secondary | ICD-10-CM | POA: Diagnosis not present

## 2019-02-11 LAB — COMPREHENSIVE METABOLIC PANEL
ALT: 22 U/L (ref 0–44)
AST: 18 U/L (ref 15–41)
Albumin: 3.8 g/dL (ref 3.5–5.0)
Alkaline Phosphatase: 122 U/L (ref 38–126)
Anion gap: 11 (ref 5–15)
BUN: 16 mg/dL (ref 8–23)
CO2: 23 mmol/L (ref 22–32)
Calcium: 8.7 mg/dL — ABNORMAL LOW (ref 8.9–10.3)
Chloride: 105 mmol/L (ref 98–111)
Creatinine, Ser: 0.61 mg/dL (ref 0.61–1.24)
GFR calc Af Amer: >60 mL/min (ref >60)
GFR calc non Af Amer: >60 mL/min (ref >60)
Glucose, Bld: 196 mg/dL — ABNORMAL HIGH (ref 70–99)
Potassium: 3.6 mmol/L (ref 3.5–5.1)
Sodium: 139 mmol/L (ref 135–145)
Total Bilirubin: 0.8 mg/dL (ref 0.3–1.2)
Total Protein: 6.7 g/dL (ref 6.5–8.1)

## 2019-02-11 LAB — CBC WITH DIFFERENTIAL/PLATELET
Abs Immature Granulocytes: 0.1 10*3/uL — ABNORMAL HIGH (ref 0.00–0.07)
Basophils Absolute: 0.1 10*3/uL (ref 0.0–0.1)
Basophils Relative: 1 %
Eosinophils Absolute: 0.3 10*3/uL (ref 0.0–0.5)
Eosinophils Relative: 2 %
HCT: 40 % (ref 39.0–52.0)
Hemoglobin: 13.3 g/dL (ref 13.0–17.0)
Immature Granulocytes: 1 %
Lymphocytes Relative: 17 %
Lymphs Abs: 2.3 10*3/uL (ref 0.7–4.0)
MCH: 31.4 pg (ref 26.0–34.0)
MCHC: 33.3 g/dL (ref 30.0–36.0)
MCV: 94.3 fL (ref 80.0–100.0)
Monocytes Absolute: 1.2 10*3/uL — ABNORMAL HIGH (ref 0.1–1.0)
Monocytes Relative: 9 %
Neutro Abs: 9.7 10*3/uL — ABNORMAL HIGH (ref 1.7–7.7)
Neutrophils Relative %: 70 %
Platelets: 369 10*3/uL (ref 150–400)
RBC: 4.24 MIL/uL (ref 4.22–5.81)
RDW: 16.3 % — ABNORMAL HIGH (ref 11.5–15.5)
WBC: 13.7 10*3/uL — ABNORMAL HIGH (ref 4.0–10.5)
nRBC: 0 % (ref 0.0–0.2)

## 2019-02-11 MED ORDER — SODIUM CHLORIDE 0.9 % IV SOLN
376.0000 mg/m2 | Freq: Once | INTRAVENOUS | Status: AC
  Administered 2019-02-11: 700 mg via INTRAVENOUS
  Filled 2019-02-11: qty 35

## 2019-02-11 MED ORDER — SODIUM CHLORIDE 0.9 % IV SOLN
Freq: Once | INTRAVENOUS | Status: AC
  Administered 2019-02-11: 10:00:00 via INTRAVENOUS
  Filled 2019-02-11: qty 5

## 2019-02-11 MED ORDER — SODIUM CHLORIDE 0.9 % IV SOLN
2400.0000 mg/m2 | INTRAVENOUS | Status: DC
  Administered 2019-02-11: 4450 mg via INTRAVENOUS
  Filled 2019-02-11: qty 89

## 2019-02-11 MED ORDER — DEXTROSE 5 % IV SOLN
Freq: Once | INTRAVENOUS | Status: AC
  Administered 2019-02-11: 09:00:00 via INTRAVENOUS

## 2019-02-11 MED ORDER — ATROPINE SULFATE 1 MG/ML IJ SOLN
0.5000 mg | Freq: Once | INTRAMUSCULAR | Status: AC | PRN
  Administered 2019-02-11: 0.5 mg via INTRAVENOUS

## 2019-02-11 MED ORDER — OXALIPLATIN CHEMO INJECTION 100 MG/20ML
63.7500 mg/m2 | Freq: Once | INTRAVENOUS | Status: AC
  Administered 2019-02-11: 120 mg via INTRAVENOUS
  Filled 2019-02-11: qty 4

## 2019-02-11 MED ORDER — PALONOSETRON HCL INJECTION 0.25 MG/5ML
INTRAVENOUS | Status: AC
  Filled 2019-02-11: qty 5

## 2019-02-11 MED ORDER — SODIUM CHLORIDE 0.9 % IV SOLN
150.0000 mg/m2 | Freq: Once | INTRAVENOUS | Status: AC
  Administered 2019-02-11: 280 mg via INTRAVENOUS
  Filled 2019-02-11: qty 4

## 2019-02-11 MED ORDER — PALONOSETRON HCL INJECTION 0.25 MG/5ML
0.2500 mg | Freq: Once | INTRAVENOUS | Status: AC
  Administered 2019-02-11: 0.25 mg via INTRAVENOUS

## 2019-02-11 MED ORDER — SODIUM CHLORIDE 0.9% FLUSH
10.0000 mL | INTRAVENOUS | Status: DC | PRN
  Administered 2019-02-11: 10 mL
  Filled 2019-02-11: qty 10

## 2019-02-11 MED ORDER — ATROPINE SULFATE 1 MG/ML IJ SOLN
INTRAMUSCULAR | Status: AC
  Filled 2019-02-11: qty 1

## 2019-02-11 NOTE — Patient Instructions (Signed)
[facility] at [facility] Discharge Instructions  You were seen today by Dr. Katragadda. He went over your recent lab results. He will see you back in 2 weeks for labs and follow up.   Thank you for choosing [facility] at [facility] to provide your oncology and hematology care.  To afford each patient quality time with our provider, please arrive at least 15 minutes before your scheduled appointment time.   If you have a lab appointment with the Cancer Center please come in thru the  Main Entrance and check in at the main information desk  You need to re-schedule your appointment should you arrive 10 or more minutes late.  We strive to give you quality time with our providers, and arriving late affects you and other patients whose appointments are after yours.  Also, if you no show three or more times for appointments you may be dismissed from the clinic at the providers discretion.     Again, thank you for choosing [facility].  Our hope is that these requests will decrease the amount of time that you wait before being seen by our physicians.       _____________________________________________________________  Should you have questions after your visit to [facility], please contact our office at [phone] between the hours of 8:00 a.m. and 4:30 p.m.  Voicemails left after 4:00 p.m. will not be returned until the following business day.  For prescription refill requests, have your pharmacy contact our office and allow 72 hours.    Cancer Center Support Programs:   > Cancer Support Group  2nd Tuesday of the month 1pm-2pm, Journey Room    

## 2019-02-11 NOTE — Progress Notes (Signed)
[place], [place] [address]   CLINIC:  Medical Oncology/Hematology  PCP:  Doree Albee, MD [place] [address] [phone]   REASON FOR VISIT:  Follow-up for pancreatic cancer      BRIEF ONCOLOGIC HISTORY:    Pancreatic adenocarcinoma ([place])   08/20/2018 Imaging    CT abdomen/pelvis w/ contrast: IMPRESSION: Atrophy and ductal dilatation involving the pancreatic tail, with suspected small soft tissue mass in the pancreatic body which could represent pancreatic carcinoma. Abdomen MRI and MRCP without and with contrast is recommended for further evaluation.  No evidence of hepatobiliary disease.    08/30/2018 Imaging    MRI abdomen w/ contrast: IMPRESSION: 1. Although not definitive, there remains concern of a small hypoenhancing mass at the junction of the pancreatic body and tail associated with atrophy and ductal dilatation in the pancreatic tail. This remains concerning for pancreatic neoplasm. Postinflammatory stricture less likely. Besides a tiny cystic lesion in the pancreatic tail, there are no other signs of previous pancreatitis. Further evaluation with endoscopic ultrasound for possible biopsy strongly recommended. 2. No evidence of metastatic disease. 3. Cholelithiasis without evidence of cholecystitis or biliary dilatation.    09/03/2018 Initial Diagnosis    Pancreatic adenocarcinoma ([place])    10/03/2018 Procedure    EUS: - 2.6cm irregularly shaped mass in the body of the pancreas that causing main pancreatic duct obstruction and dilation. The mass abuts the splenic vessels but no other significant vascular structures and it was sampled with trangastric EUS FNA. Preliminary cytology is + for malignancy, likely well-differentiated adenocarcinoma. It appears surgically resectable.    10/03/2018 Pathology Results    Accession: [id number]  FINE NEEDLE ASPIRATION, ENDOSCOPIC, PANCREAS BODY  (SPECIMEN 1 OF 1 COLLECTED 10/03/18): ADENOCARCINOMA.    10/17/2018 Imaging    PET: IMPRESSION: 1. Tiny focus of hypermetabolism identified in the body of pancreas, adjacent to the abrupt cut off of the main pancreatic duct. No evidence for hypermetabolic metastatic disease in the neck, chest, abdomen, or pelvis. 2. Cholelithiasis. 3.  Aortic Atherosclerois (ICD10-170.0) 4. Prostatomegaly    11/12/2018 Genetic Testing    BRCA1 VUS identified on the common hereditary cancer panel.  The Hereditary Gene Panel offered by [facility] includes sequencing and/or deletion duplication testing of the following 47 genes: APC, ATM, AXIN2, BARD1, BMPR1A, BRCA1, BRCA2, BRIP1, CDH1, CDK4, CDKN2A (p14ARF), CDKN2A (p16INK4a), CHEK2, CTNNA1, DICER1, EPCAM (Deletion/duplication testing only), GREM1 (promoter region deletion/duplication testing only), KIT, MEN1, MLH1, MSH2, MSH3, MSH6, MUTYH, NBN, NF1, NHTL1, PALB2, PDGFRA, PMS2, POLD1, POLE, PTEN, RAD50, RAD51C, RAD51D, SDHB, SDHC, SDHD, SMAD4, SMARCA4. STK11, TP53, TSC1, TSC2, and VHL.  The following genes were evaluated for sequence changes only: SDHA and HOXB13 c.251G>A variant only. The report date is November 12, 2018.    11/20/2018 Surgery    Distal subtotal pancreatectomy with splenectomy at [facility]    11/20/2018 Surgery    TUMOR   Tumor Site:  Pancreatic body    Histologic Type:  Ductal adenocarcinoma    Histologic Grade:  G1: Well differentiated    Tumor Size:  Greatest dimension in Centimeters (cm): 2.8 Centimeters (cm)    Additional Dimension in Centimeters (cm):  2.7 Centimeters (cm)    Additional Dimension in Centimeters (cm):  1.8 Centimeters (cm)   Tumor Extent:      Tumor Extension:  Tumor is confined to pancreas    Accessory Findings:      Treatment Effect:  No known presurgical therapy  Lymphovascular Invasion:  Not identified     Perineural Invasion:  Not identified   MARGINS   Margins:       Proximal Pancreatic Parenchymal Margin:  Uninvolved by invasive carcinoma and pancreatic high-grade intraepithelial neoplasia      Distance of Invasive Carcinoma from Margin:  0.6 Centimeters (cm)   :      Other Margin:  Splenic margin.      Margin Status:  Uninvolved by invasive carcinoma   LYMPH NODES  Number of Lymph Nodes Involved:  2   Number of Lymph Nodes Examined:  16   PATHOLOGIC STAGE CLASSIFICATION (pTNM, AJCC 8th Edition)  TNM Descriptors:  Not applicable   Primary Tumor (pT):  pT2   Regional Lymph Nodes (pN):  pN1   ADDITIONAL FINDINGS  Additional Pathologic Findings:  Pancreatic intraepithelial neoplasia    Highest Grade (PanIN):  High grade PanIN is present.   Additional Pathologic Findings:  Benign unilocular pancreatic cyst (1.3 cm in greatest dimension) at the pancreatic tail.    12/30/2018 -  Chemotherapy    The patient had palonosetron (ALOXI) injection 0.25 mg, 0.25 mg, Intravenous,  Once, 4 of 12 cycles Administration: 0.25 mg (12/30/2018), 0.25 mg (01/13/2019), 0.25 mg (01/28/2019) pegfilgrastim (NEULASTA) injection 6 mg, 6 mg, Subcutaneous, Once, 4 of 12 cycles Administration: 6 mg (01/01/2019), 6 mg (01/15/2019), 6 mg (01/30/2019) irinotecan (CAMPTOSAR) 280 mg in sodium chloride 0.9 % 500 mL chemo infusion, 150 mg/m2 = 280 mg (100 % of original dose 150 mg/m2), Intravenous,  Once, 4 of 12 cycles Dose modification: 150 mg/m2 (original dose 150 mg/m2, Cycle 1, Reason: Provider Judgment) Administration: 280 mg (12/30/2018), 280 mg (01/13/2019), 280 mg (01/28/2019) leucovorin 700 mg in sodium chloride 0.9 % 250 mL infusion, 744 mg, Intravenous,  Once, 4 of 12 cycles Administration: 700 mg (12/30/2018), 700 mg (01/13/2019), 700 mg (01/28/2019) oxaliplatin (ELOXATIN) 120 mg in dextrose 5 % 500 mL chemo infusion, 63.75 mg/m2 = 120 mg (75 % of original dose 85 mg/m2), Intravenous,  Once, 4 of 12 cycles Dose modification: 63.75 mg/m2 (75 % of  original dose 85 mg/m2, Cycle 1, Reason: Other (see comments), Comment: high risk for worsening neuropathy) Administration: 120 mg (12/30/2018), 120 mg (01/13/2019), 120 mg (01/28/2019) fosaprepitant (EMEND) 150 mg, dexamethasone (DECADRON) 12 mg in sodium chloride 0.9 % 145 mL IVPB, , Intravenous,  Once, 4 of 12 cycles Administration:  (12/30/2018),  (01/13/2019),  (01/28/2019) fluorouracil (ADRUCIL) 4,450 mg in sodium chloride 0.9 % 61 mL chemo infusion, 2,400 mg/m2 = 4,450 mg, Intravenous, 1 Day/Dose, 4 of 12 cycles Administration: 4,450 mg (12/30/2018), 4,450 mg (01/13/2019), 4,450 mg (01/28/2019)  for chemotherapy treatment.       CANCER STAGING: Cancer Staging No matching staging information was found for the patient.   INTERVAL HISTORY:  Mark Foster 63 y.o. male returns for chemotherapy for pancreatic cancer.  He received cycle 3 on 01/28/2019.  Had mild nausea but denied any vomiting.  No diarrhea was reported.  He felt tired for the first week which gradually improved.  Denied any abdominal cramping or bleeding per rectum.  Denies any numbness or tingling in the feet.  Denies any fevers or infections.  Appetite has been stable.  It is reported 75%.  Energy levels are 50%.  He does report occasional lightheadedness which is also stable.  He had this on and off for many years.   REVIEW OF SYSTEMS:  Review of Systems  Constitutional: Negative for fatigue.  Gastrointestinal: Positive for nausea.  Neurological:  Positive for dizziness and numbness.  All other systems reviewed and are negative.    PAST MEDICAL/SURGICAL HISTORY:  Past Medical History:  Diagnosis Date   Cervical compression fracture (HCC)    Cervical spinal stenosis    Diabetic neuropathy (HCC)    Bilateral legs   Diverticulitis    Family history of pancreatic cancer    History of kidney stones    Hypothyroidism    Pancreatic cancer ([place])    Stenosis of left vertebral artery    Stroke [facility])    2011   Type  2 diabetes mellitus ([place])    Past Surgical History:  Procedure Laterality Date   APPENDECTOMY     COLON RESECTION     For diverticulitis   COLON SURGERY     ESOPHAGOGASTRODUODENOSCOPY N/A 10/03/2018   Procedure: ESOPHAGOGASTRODUODENOSCOPY (EGD);  Surgeon: [name], MD;  Location: [name];  Service: Endoscopy;  Laterality: N/A;   EUS N/A 10/03/2018   Procedure: UPPER ENDOSCOPIC ULTRASOUND (EUS) RADIAL;  Surgeon: [name], MD;  Location: [facility];  Service: Endoscopy;  Laterality: N/A;   FINE NEEDLE ASPIRATION N/A 10/03/2018   Procedure: FINE NEEDLE ASPIRATION (FNA) LINEAR;  Surgeon: [name], MD;  Location: [facility] END[facility];  Service: Endoscopy;  Laterality: N/A;   IR ANGIO INTRA EXTRACRAN SEL COM CAROTID INNOMINATE BILAT MOD SED  01/21/2018   IR ANGIO INTRA EXTRACRAN SEL COM CAROTID INNOMINATE UNI R MOD SED  03/21/2018   IR ANGIO VERTEBRAL SEL VERTEBRAL BILAT MOD SED  01/21/2018   IR TRANSCATH EXCRAN VERT OR CAR A STENT  03/21/2018   PORTACATH PLACEMENT Left 12/23/2018   Procedure: INSERTION PORT-A-CATH;  Surgeon: Virl Cagey, MD;  Location: [facility];  Service: General;  Laterality: Left;   RADIOLOGY WITH ANESTHESIA N/A 03/21/2018   Procedure: IR WITH ANESTHESIA WITH STENT PLACEMENT;  Surgeon: Luanne Bras, MD;  Location: [place];  Service: Radiology;  Laterality: N/A;   ROTATOR CUFF REPAIR     Left     SOCIAL HISTORY:  Social History   Socioeconomic History   Marital status: Single    Spouse name: Not on file   Number of children: 2   Years of education: Not on file   Highest education level: Not on file  Occupational History   Occupation: Estate agent  Social Needs   Financial resource strain: Somewhat hard   Food insecurity:    Worry: Never true    Inability: Never true   Transportation needs:    Medical: No    Non-medical: No  Tobacco Use   Smoking status: Never Smoker   Smokeless tobacco: Never Used    Substance and Sexual Activity   Alcohol use: Not Currently   Drug use: Never   Sexual activity: Not on file  Lifestyle   Physical activity:    Days per week: 0 days    Minutes per session: 0 min   Stress: To some extent  Relationships   Social connections:    Talks on phone: More than three times a week    Gets together: Once a week    Attends religious service: More than 4 times per year    Active member of club or organization: No    Attends meetings of clubs or organizations: Never    Relationship status: Divorced   Intimate partner violence:    Fear of current or ex partner: No    Emotionally abused: No    Physically abused: No  Forced sexual activity: No  Other Topics Concern   Not on file  Social History Narrative   Not on file    FAMILY HISTORY:  Family History  Problem Relation Age of Onset   Stroke Mother    Pancreatic cancer Father 11       d. 21   Stroke Maternal Grandmother    Heart attack Maternal Grandfather    Heart Problems Paternal Grandfather    Scoliosis Daughter    Muscular dystrophy Grandson     CURRENT MEDICATIONS:  Outpatient Encounter Medications as of 02/11/2019  Medication Sig   aspirin 81 MG tablet Take 1 tablet (81 mg total) by mouth daily.   atorvastatin (LIPITOR) 40 MG tablet Take 1 tablet (40 mg total) by mouth daily at 6 PM.   atorvastatin (LIPITOR) 80 MG tablet    Continuous Blood Gluc Sensor (FREESTYLE LIBRE 14 DAY SENSOR) MISC 1 Device by Does not apply route every 14 (fourteen) days.   dextrose 5 % SOLN 1,000 mL with fluorouracil 5 GM/100ML SOLN Inject into the vein over 48 hr.   ECHINACEA EXTRACT PO Take 2 tablets by mouth daily.    FLUOROURACIL IV Inject into the vein every 14 (fourteen) days.   gabapentin (NEURONTIN) 300 MG capsule Take 1 capsule (300 mg total) by mouth 2 (two) times daily.   Insulin Glargine (BASAGLAR KWIKPEN) 100 UNIT/ML SOPN Inject 0.3 mLs (30 Units total) into the skin at  bedtime.   Insulin Pen Needle 32G X 4 MM MISC 1 Device by Does not apply route daily.   IRINOTECAN HCL IV Inject into the vein every 14 (fourteen) days.   LEUCOVORIN CALCIUM IV Inject into the vein every 14 (fourteen) days.   lidocaine-prilocaine (EMLA) cream Apply pea-sized amount to port-a-cath site and cover with plastic wrap one hour prior to chemotherapy appointments   loperamide (IMODIUM A-D) 2 MG tablet Take 1 tablet (2 mg total) by mouth as needed. Take 2 at diarrhea onset , then 1 every 2hr until 12hrs with no BM. May take 2 every 4hrs at night. If diarrhea recurs repeat.   NP THYROID 30 MG tablet Take 30 mg by mouth daily before breakfast.    Omega-3 1000 MG CAPS Take 1,000 mg by mouth daily.   OXALIPLATIN IV Inject into the vein every 14 (fourteen) days.   pantoprazole (PROTONIX) 20 MG tablet Take 20 mg by mouth daily.    prochlorperazine (COMPAZINE) 10 MG tablet Take 1 tablet (10 mg total) by mouth every 6 (six) hours as needed (NAUSEA).   sertraline (ZOLOFT) 50 MG tablet Take 50 mg by mouth every morning.    tamsulosin (FLOMAX) 0.4 MG CAPS capsule Take 0.4 mg by mouth daily.    ticagrelor (BRILINTA) 90 MG TABS tablet Take 1 tablet (90 mg total) by mouth 2 (two) times daily.   traMADol (ULTRAM) 50 MG tablet    vitamin B-12 (CYANOCOBALAMIN) 1000 MCG tablet Take 1,000 mcg by mouth daily.   Facility-Administered Encounter Medications as of 02/11/2019  Medication   0.9 %  sodium chloride infusion   0.9 %  sodium chloride infusion   dextrose 5 % solution   sodium chloride flush (NS) 0.9 % injection 10 mL    ALLERGIES:  Allergies  Allergen Reactions   Demerol [Meperidine Hcl] Other (See Comments)    convulsions     PHYSICAL EXAM:  ECOG Performance status: 1  Vitals:   02/11/19 0832  BP: (!) 103/52  Pulse: 79  Resp: 18  Temp: 98.1 F (36.7 C)  SpO2: 98%   Filed Weights   02/11/19 0832  Weight: 152 lb 3.2 oz (69 kg)    Physical Exam Vitals  signs reviewed.  Constitutional:      Appearance: Normal appearance.  Cardiovascular:     Rate and Rhythm: Normal rate and regular rhythm.     Heart sounds: Normal heart sounds.  Pulmonary:     Effort: Pulmonary effort is normal.     Breath sounds: Normal breath sounds.  Abdominal:     General: There is no distension.     Palpations: Abdomen is soft. There is no mass.  Musculoskeletal:        General: No swelling.  Skin:    General: Skin is warm.  Neurological:     General: No focal deficit present.     Mental Status: He is alert and oriented to person, place, and time.  Psychiatric:        Mood and Affect: Mood normal.        Behavior: Behavior normal.      LABORATORY DATA:  I have reviewed the labs as listed.  CBC    Component Value Date/Time   WBC 13.7 (H) 02/11/2019 0822   RBC 4.24 02/11/2019 0822   HGB 13.3 02/11/2019 0822   HGB 14.9 12/11/2018 1018   HCT 40.0 02/11/2019 0822   PLT 369 02/11/2019 0822   PLT 335 12/11/2018 1018   MCV 94.3 02/11/2019 0822   MCH 31.4 02/11/2019 0822   MCHC 33.3 02/11/2019 0822   RDW 16.3 (H) 02/11/2019 0822   LYMPHSABS 2.3 02/11/2019 0822   MONOABS 1.2 (H) 02/11/2019 0822   EOSABS 0.3 02/11/2019 0822   BASOSABS 0.1 02/11/2019 0822   CMP Latest Ref Rng & Units 02/11/2019 01/28/2019 01/13/2019  Glucose 70 - 99 mg/dL 196(H) 292(H) 260(H)  BUN 8 - 23 mg/dL _0 Creatinine 0.61 - 1.24 mg/dL 0.61 0.58(L) 0.54(L)  Sodium 135 - 145 mmol/L 139 137 137  Potassium 3.5 - 5.1 mmol/L 3.6 3.6 4.0  Chloride 98 - 111 mmol/L 105 104 101  CO2 22 - 32 mmol/L _1 Calcium 8.9 - 10.3 mg/dL 8.7(L) 8.5(L) 9.1  Total Protein 6.5 - 8.1 g/dL 6.7 6.4(L) 6.9  Total Bilirubin 0.3 - 1.2 mg/dL 0.8 0.5 0.8  Alkaline Phos 38 - 126 U/L 122 124 102  AST 15 - 41 U/L _2 ALT 0 - 44 U/L _3 DIAGNOSTIC IMAGING:  I have independently reviewed the scans and discussed with the patient.      ASSESSMENT & PLAN:   Pancreatic  adenocarcinoma (HCC) 1.  Stage IIb (T2N1) pancreatic adenocarcinoma: -Status post distal pancreatectomy and splenectomy at [facility] by Dr. Zenia Resides on 11/20/2018. - Genetic testing shows BRCA1 heterozygous VUS - He was recommended 6 months of adjuvant chemotherapy. -CT CAP from 12/27/2018 shows postoperative changes of stable pancreatectomy and splenectomy with postoperative fluid collection in the body of the pancreas adjacent to the resection line, measuring 3 x 3.4 cm, likely postoperative pseudocyst.  No evidence of metastatic disease.  Postoperative CEA was 20. -3 cycles of FOLFIRINOX (oxaliplatin dose reduced by 25%) from 12/30/2018 through 01/28/2019.  - He tolerated his third cycle reasonably well.  He felt tired during the first week. - I have reviewed his labs.  He may proceed with his cycle 4 today without any dose modifications.  Oxaliplatin is already dose reduced by  25%.  2.  Postsplenectomy state: -He did receive vaccinations prior to splenectomy. -We will follow-up on booster  schedule.  3.  Diabetes: -He is on Basaglar 30 units daily. -He has a follow-up appointment with Dr. Dorris Fetch later this week.  4.  Neuropathy: - He has questionable signs of neuropathy from his diabetes.  He denies any tingling or numbness in extremities. - We have cut back on oxaliplatin dose by 25% since the start.    Total time spent is 25 minutes with more than 50% of the time spent face-to-face discussing and reinforcing treatment plan and coordination of care.  Orders placed this encounter:  Orders Placed This Encounter  Procedures   CBC with Differential/Platelet   Comprehensive metabolic panel      Derek Jack, MD [place] [phone]

## 2019-02-11 NOTE — Progress Notes (Signed)
Treatment given today per MD orders. Tolerated infusion without adverse affects. Vital signs stable. No complaints at this time. 5FU pump infusing per protocol. RUN noted on screen. Discharged from clinic ambulatory. F/U with [facility] as scheduled.

## 2019-02-11 NOTE — Patient Instructions (Signed)
[facility] Discharge Instructions for Patients Receiving Chemotherapy  Today you received the following chemotherapy agents   To help prevent nausea and vomiting after your treatment, we encourage you to take your nausea medication   If you develop nausea and vomiting that is not controlled by your nausea medication, call the clinic.   BELOW ARE SYMPTOMS THAT SHOULD BE REPORTED IMMEDIATELY:  *FEVER GREATER THAN 100.5 F  *CHILLS WITH OR WITHOUT FEVER  NAUSEA AND VOMITING THAT IS NOT CONTROLLED WITH YOUR NAUSEA MEDICATION  *UNUSUAL SHORTNESS OF BREATH  *UNUSUAL BRUISING OR BLEEDING  TENDERNESS IN MOUTH AND THROAT WITH OR WITHOUT PRESENCE OF ULCERS  *URINARY PROBLEMS  *BOWEL PROBLEMS  UNUSUAL RASH Items with * indicate a potential emergency and should be followed up as soon as possible.  Feel free to call the clinic should you have any questions or concerns. The clinic phone number is [phone].  Please show the CHEMO ALERT CARD at check-in to the Emergency Department and triage nurse.   

## 2019-02-11 NOTE — Assessment & Plan Note (Addendum)
1.  Stage IIb (T2N1) pancreatic adenocarcinoma: -Status post distal pancreatectomy and splenectomy at [facility] by Dr. Zenia Resides on 11/20/2018. - Genetic testing shows BRCA1 heterozygous VUS - He was recommended 6 months of adjuvant chemotherapy. -CT CAP from 12/27/2018 shows postoperative changes of stable pancreatectomy and splenectomy with postoperative fluid collection in the body of the pancreas adjacent to the resection line, measuring 3 x 3.4 cm, likely postoperative pseudocyst.  No evidence of metastatic disease.  Postoperative CEA was 20. -3 cycles of FOLFIRINOX (oxaliplatin dose reduced by 25%) from 12/30/2018 through 01/28/2019.  - He tolerated his third cycle reasonably well.  He felt tired during the first week. - I have reviewed his labs.  He may proceed with his cycle 4 today without any dose modifications.  Oxaliplatin is already dose reduced by 25%.  2.  Postsplenectomy state: -He did receive vaccinations prior to splenectomy. -We will follow-up on booster  schedule.  3.  Diabetes: -He is on Basaglar 30 units daily. -He has a follow-up appointment with Dr. Dorris Fetch later this week.  4.  Neuropathy: - He has questionable signs of neuropathy from his diabetes.  He denies any tingling or numbness in extremities. - We have cut back on oxaliplatin dose by 25% since the start.

## 2019-02-13 ENCOUNTER — Encounter (HOSPITAL_COMMUNITY): Payer: Self-pay

## 2019-02-13 ENCOUNTER — Other Ambulatory Visit: Payer: Self-pay

## 2019-02-13 ENCOUNTER — Inpatient Hospital Stay (HOSPITAL_COMMUNITY): Payer: PRIVATE HEALTH INSURANCE

## 2019-02-13 VITALS — BP 97/59 | HR 76 | Temp 98.9°F | Resp 18

## 2019-02-13 DIAGNOSIS — Z5111 Encounter for antineoplastic chemotherapy: Secondary | ICD-10-CM | POA: Diagnosis not present

## 2019-02-13 DIAGNOSIS — Z95828 Presence of other vascular implants and grafts: Secondary | ICD-10-CM

## 2019-02-13 DIAGNOSIS — C259 Malignant neoplasm of pancreas, unspecified: Secondary | ICD-10-CM

## 2019-02-13 MED ORDER — PEGFILGRASTIM INJECTION 6 MG/0.6ML ~~LOC~~
PREFILLED_SYRINGE | SUBCUTANEOUS | Status: AC
  Filled 2019-02-13: qty 0.6

## 2019-02-13 MED ORDER — HEPARIN SOD (PORK) LOCK FLUSH 100 UNIT/ML IV SOLN
500.0000 [IU] | Freq: Once | INTRAVENOUS | Status: AC | PRN
  Administered 2019-02-13: 500 [IU]

## 2019-02-13 MED ORDER — PEGFILGRASTIM INJECTION 6 MG/0.6ML ~~LOC~~
6.0000 mg | PREFILLED_SYRINGE | Freq: Once | SUBCUTANEOUS | Status: AC
  Administered 2019-02-13: 6 mg via SUBCUTANEOUS

## 2019-02-13 MED ORDER — SODIUM CHLORIDE 0.9% FLUSH
10.0000 mL | INTRAVENOUS | Status: DC | PRN
  Administered 2019-02-13: 10 mL
  Filled 2019-02-13: qty 10

## 2019-02-13 NOTE — Progress Notes (Signed)
Mark Foster tolerated 5FU pump well without complaints or incident. 5FU pump discontinued with portacath flushed easily per protocol then de-accessed. Neulasta injection given as well. VSS Pt discharged self ambulatory in satisfactory condition

## 2019-02-13 NOTE — Patient Instructions (Signed)
[place] at [facility] Discharge Instructions  5FU pump discontinued with portacath flushed per protocol and Neulasta injection given today as well. Follow-up as scheduled. Call clinic for any questions or concerns   Thank you for choosing [place] at [facility] to provide your oncology and hematology care.  To afford each patient quality time with our provider, please arrive at least 15 minutes before your scheduled appointment time.   If you have a lab appointment with the [facility] please come in thru the  Main Entrance and check in at the main information desk  You need to re-schedule your appointment should you arrive 10 or more minutes late.  We strive to give you quality time with our providers, and arriving late affects you and other patients whose appointments are after yours.  Also, if you no show three or more times for appointments you may be dismissed from the clinic at the providers discretion.     Again, thank you for choosing [facility].  Our hope is that these requests will decrease the amount of time that you wait before being seen by our physicians.       _____________________________________________________________  Should you have questions after your visit to [facility], please contact our office at [phone] between the hours of 8:00 a.m. and 4:30 p.m.  Voicemails left after 4:00 p.m. will not be returned until the following business day.  For prescription refill requests, have your pharmacy contact our office and allow 72 hours.    Cancer Center Support Programs:   > Cancer Support Group  2nd Tuesday of the month 1pm-2pm, Journey Room

## 2019-02-19 ENCOUNTER — Ambulatory Visit (INDEPENDENT_AMBULATORY_CARE_PROVIDER_SITE_OTHER): Payer: PRIVATE HEALTH INSURANCE | Admitting: "Endocrinology

## 2019-02-19 ENCOUNTER — Emergency Department (HOSPITAL_COMMUNITY): Payer: PRIVATE HEALTH INSURANCE

## 2019-02-19 ENCOUNTER — Other Ambulatory Visit: Payer: Self-pay

## 2019-02-19 ENCOUNTER — Emergency Department (HOSPITAL_COMMUNITY)
Admission: EM | Admit: 2019-02-19 | Discharge: 2019-02-19 | Disposition: A | Discharge status: Home / Self Care | Payer: PRIVATE HEALTH INSURANCE | Attending: Emergency Medicine | Admitting: Emergency Medicine

## 2019-02-19 ENCOUNTER — Encounter (HOSPITAL_COMMUNITY): Payer: Self-pay | Admitting: Emergency Medicine

## 2019-02-19 ENCOUNTER — Encounter: Payer: Self-pay | Admitting: "Endocrinology

## 2019-02-19 DIAGNOSIS — Z794 Long term (current) use of insulin: Secondary | ICD-10-CM | POA: Diagnosis not present

## 2019-02-19 DIAGNOSIS — E1165 Type 2 diabetes mellitus with hyperglycemia: Secondary | ICD-10-CM

## 2019-02-19 DIAGNOSIS — S99912A Unspecified injury of left ankle, initial encounter: Secondary | ICD-10-CM | POA: Diagnosis present

## 2019-02-19 DIAGNOSIS — E039 Hypothyroidism, unspecified: Secondary | ICD-10-CM

## 2019-02-19 DIAGNOSIS — Z7982 Long term (current) use of aspirin: Secondary | ICD-10-CM | POA: Diagnosis not present

## 2019-02-19 DIAGNOSIS — Y999 Unspecified external cause status: Secondary | ICD-10-CM | POA: Insufficient documentation

## 2019-02-19 DIAGNOSIS — S8002XA Contusion of left knee, initial encounter: Secondary | ICD-10-CM | POA: Diagnosis not present

## 2019-02-19 DIAGNOSIS — S82892A Other fracture of left lower leg, initial encounter for closed fracture: Secondary | ICD-10-CM | POA: Insufficient documentation

## 2019-02-19 DIAGNOSIS — Z79899 Other long term (current) drug therapy: Secondary | ICD-10-CM | POA: Diagnosis not present

## 2019-02-19 DIAGNOSIS — Y9389 Activity, other specified: Secondary | ICD-10-CM | POA: Diagnosis not present

## 2019-02-19 DIAGNOSIS — E782 Mixed hyperlipidemia: Secondary | ICD-10-CM | POA: Diagnosis not present

## 2019-02-19 DIAGNOSIS — I10 Essential (primary) hypertension: Secondary | ICD-10-CM

## 2019-02-19 DIAGNOSIS — S8012XA Contusion of left lower leg, initial encounter: Secondary | ICD-10-CM | POA: Insufficient documentation

## 2019-02-19 DIAGNOSIS — Z8673 Personal history of transient ischemic attack (TIA), and cerebral infarction without residual deficits: Secondary | ICD-10-CM | POA: Insufficient documentation

## 2019-02-19 DIAGNOSIS — Y92410 Unspecified street and highway as the place of occurrence of the external cause: Secondary | ICD-10-CM | POA: Insufficient documentation

## 2019-02-19 DIAGNOSIS — E1140 Type 2 diabetes mellitus with diabetic neuropathy, unspecified: Secondary | ICD-10-CM | POA: Diagnosis not present

## 2019-02-19 MED ORDER — BASAGLAR KWIKPEN 100 UNIT/ML ~~LOC~~ SOPN: 38.0000 [IU] | PEN_INJECTOR | Freq: Every day | SUBCUTANEOUS | 2 refills | Status: DC

## 2019-02-19 MED ORDER — HYDROCODONE-ACETAMINOPHEN 5-325 MG PO TABS: 1.0000 | ORAL_TABLET | ORAL | 0 refills | Status: DC | PRN

## 2019-02-19 NOTE — Discharge Instructions (Signed)
You have a tiny avulsion fracture on the outside lateral and the inside medial part of your ankle.  Please keep your left lower extremity elevated above your waist when sitting and above your heart when you are lying down.  Apply ice when you can tolerate it.  May use Norco every 4 hours as needed for pain.  Please call Dr. Aline Brochure for an orthopedic evaluation of these fractures.  Please use your cam walker when up and about.  You do not need to sleep in this device.

## 2019-02-19 NOTE — Progress Notes (Signed)
02/19/2019, 4:24 PM                                 Endocrinology Telehealth Visit Follow up Note -During COVID -19 Pandemic  I connected with Mark Foster on 02/19/2019   by telephone and verified that I am speaking with the correct person using two identifiers. Mark Foster, [id number]. he has verbally consented to this visit. All issues noted in this document were discussed and addressed. The format was not optimal for physical exam.   Subjective:    Patient ID: Mark Foster, male    DOB: 1955-09-25.  Mark Foster is being engaged in telehealth in follow-up for management of currently uncontrolled symptomatic pancreatic diabetes, hypothyroidism, hyperlipidemia. PMD:  Doree Albee, MD.   Past Medical History:  Diagnosis Date  . Cervical compression fracture ([place])   . Cervical spinal stenosis   . Diabetic neuropathy (HCC)    Bilateral legs  . Diverticulitis   . Family history of pancreatic cancer   . History of kidney stones   . Hypothyroidism   . Pancreatic cancer ([place])   . Stenosis of left vertebral artery   . Stroke [facility])    2011  . Type 2 diabetes mellitus ([place])    Past Surgical History:  Procedure Laterality Date  . APPENDECTOMY    . COLON RESECTION     For diverticulitis  . COLON SURGERY    . ESOPHAGOGASTRODUODENOSCOPY N/A 10/03/2018   Procedure: ESOPHAGOGASTRODUODENOSCOPY (EGD);  Surgeon: [name], MD;  Location: [name];  Service: Endoscopy;  Laterality: N/A;  . EUS N/A 10/03/2018   Procedure: UPPER ENDOSCOPIC ULTRASOUND (EUS) RADIAL;  Surgeon: [name], MD;  Location: [facility];  Service: Endoscopy;  Laterality: N/A;  . FINE NEEDLE ASPIRATION N/A 10/03/2018   Procedure: FINE NEEDLE ASPIRATION (FNA) LINEAR;  Surgeon: [name], MD;  Location: [facility];  Service: Endoscopy;  Laterality: N/A;  . IR ANGIO INTRA EXTRACRAN SEL COM CAROTID INNOMINATE BILAT  MOD SED  01/21/2018  . IR ANGIO INTRA EXTRACRAN SEL COM CAROTID INNOMINATE UNI R MOD SED  03/21/2018  . IR ANGIO VERTEBRAL SEL VERTEBRAL BILAT MOD SED  01/21/2018  . IR TRANSCATH EXCRAN VERT OR CAR A STENT  03/21/2018  . PORTACATH PLACEMENT Left 12/23/2018   Procedure: INSERTION PORT-A-CATH;  Surgeon: Virl Cagey, MD;  Location: [facility];  Service: General;  Laterality: Left;  . RADIOLOGY WITH ANESTHESIA N/A 03/21/2018   Procedure: IR WITH ANESTHESIA WITH STENT PLACEMENT;  Surgeon: Luanne Bras, MD;  Location: [facility];  Service: Radiology;  Laterality: N/A;  . ROTATOR CUFF REPAIR     Left   Social History   Socioeconomic History  . Marital status: Single    Spouse name: Not on file  . Number of children: 2  . Years of education: Not on file  . Highest education level: Not on file  Occupational History  . Occupation: Estate agent  Social Needs  . Financial resource strain: Somewhat hard  . Food insecurity    Worry: Never true  Inability: Never true  . Transportation needs    Medical: No    Non-medical: No  Tobacco Use  . Smoking status: Never Smoker  . Smokeless tobacco: Never Used  Substance and Sexual Activity  . Alcohol use: Not Currently  . Drug use: Never  . Sexual activity: Not on file  Lifestyle  . Physical activity    Days per week: 0 days    Minutes per session: 0 min  . Stress: To some extent  Relationships  . Social connections    Talks on phone: More than three times a week    Gets together: Once a week    Attends religious service: More than 4 times per year    Active member of club or organization: No    Attends meetings of clubs or organizations: Never    Relationship status: Divorced  Other Topics Concern  . Not on file  Social History Narrative  . Not on file   Outpatient Encounter Medications as of 02/19/2019  Medication Sig  . aspirin 81 MG tablet Take 1 tablet (81 mg total) by mouth daily.  Marland Kitchen atorvastatin (LIPITOR) 40 MG tablet Take  1 tablet (40 mg total) by mouth daily at 6 PM.  . atorvastatin (LIPITOR) 80 MG tablet   . Continuous Blood Gluc Sensor (FREESTYLE LIBRE 14 DAY SENSOR) MISC 1 Device by Does not apply route every 14 (fourteen) days.  Marland Kitchen dextrose 5 % SOLN 1,000 mL with fluorouracil 5 GM/100ML SOLN Inject into the vein over 48 hr.  . ECHINACEA EXTRACT PO Take 2 tablets by mouth daily.   Marland Kitchen FLUOROURACIL IV Inject into the vein every 14 (fourteen) days.  Marland Kitchen gabapentin (NEURONTIN) 300 MG capsule Take 1 capsule (300 mg total) by mouth 2 (two) times daily.  . Insulin Glargine (BASAGLAR KWIKPEN) 100 UNIT/ML SOPN Inject 0.38 mLs (38 Units total) into the skin at bedtime.  . Insulin Pen Needle 32G X 4 MM MISC 1 Device by Does not apply route daily.  . IRINOTECAN HCL IV Inject into the vein every 14 (fourteen) days.  Marland Kitchen LEUCOVORIN CALCIUM IV Inject into the vein every 14 (fourteen) days.  Marland Kitchen lidocaine-prilocaine (EMLA) cream Apply pea-sized amount to port-a-cath site and cover with plastic wrap one hour prior to chemotherapy appointments  . loperamide (IMODIUM A-D) 2 MG tablet Take 1 tablet (2 mg total) by mouth as needed. Take 2 at diarrhea onset , then 1 every 2hr until 12hrs with no BM. May take 2 every 4hrs at night. If diarrhea recurs repeat.  . NP THYROID 30 MG tablet Take 30 mg by mouth daily before breakfast.   . Omega-3 1000 MG CAPS Take 1,000 mg by mouth daily.  . OXALIPLATIN IV Inject into the vein every 14 (fourteen) days.  . pantoprazole (PROTONIX) 20 MG tablet Take 20 mg by mouth daily.   . prochlorperazine (COMPAZINE) 10 MG tablet Take 1 tablet (10 mg total) by mouth every 6 (six) hours as needed (NAUSEA).  Marland Kitchen sertraline (ZOLOFT) 50 MG tablet Take 50 mg by mouth every morning.   . tamsulosin (FLOMAX) 0.4 MG CAPS capsule Take 0.4 mg by mouth daily.   . ticagrelor (BRILINTA) 90 MG TABS tablet Take 1 tablet (90 mg total) by mouth 2 (two) times daily.  . traMADol (ULTRAM) 50 MG tablet   . vitamin B-12 (CYANOCOBALAMIN)  1000 MCG tablet Take 1,000 mcg by mouth daily.  . [DISCONTINUED] Insulin Glargine (BASAGLAR KWIKPEN) 100 UNIT/ML SOPN Inject 0.3 mLs (30 Units total)  into the skin at bedtime.   Facility-Administered Encounter Medications as of 02/19/2019  Medication  . 0.9 %  sodium chloride infusion  . 0.9 %  sodium chloride infusion  . dextrose 5 % solution  . sodium chloride flush (NS) 0.9 % injection 10 mL    ALLERGIES: Allergies  Allergen Reactions  . Demerol [Meperidine Hcl] Other (See Comments)    convulsions    VACCINATION STATUS: Immunization History  Administered Date(s) Administered  . HiB (PRP-T) 10/23/2018  . Influenza-Unspecified 08/02/2018  . Meningococcal Mcv4o 10/23/2018  . Pneumococcal Conjugate-13 10/23/2018    Diabetes He presents for his follow-up diabetic visit. He has type 1 diabetes mellitus. Onset time: He was diagnosed at approximate age of 6 years. His disease course has been improving (He did have A1c of 9.7% in May 2019.  However, more recently he has documented controlled glycemic profile averaging 134 over the last 2 weeks.). There are no hypoglycemic associated symptoms. Pertinent negatives for hypoglycemia include no confusion, headaches, pallor or seizures. There are no diabetic associated symptoms. Pertinent negatives for diabetes include no chest pain, no fatigue, no polydipsia, no polyphagia, no polyuria and no weakness. There are no hypoglycemic complications. Symptoms are improving. Diabetic complications include a CVA. Pertinent negatives for diabetic complications include no heart disease. (Awaiting stent placement in his carotid artery.) Risk factors for coronary artery disease include diabetes mellitus, dyslipidemia, hypertension, male sex and sedentary lifestyle. Current diabetic treatment includes insulin injections. He is following a generally unhealthy diet. When asked about meal planning, he reported none. He has not had a previous visit with a dietitian.  He rarely participates in exercise. His home blood glucose trend is increasing steadily. His breakfast blood glucose range is generally 180-200 mg/dl. His lunch blood glucose range is generally 140-180 mg/dl. His dinner blood glucose range is generally 180-200 mg/dl. His bedtime blood glucose range is generally 180-200 mg/dl. His overall blood glucose range is 180-200 mg/dl. An ACE inhibitor/angiotensin II receptor blocker is not being taken.  Hyperlipidemia This is a chronic problem. The current episode started more than 1 year ago. The problem is uncontrolled. Exacerbating diseases include diabetes. Pertinent negatives include no chest pain, myalgias or shortness of breath. Current antihyperlipidemic treatment includes statins. Risk factors for coronary artery disease include diabetes mellitus, dyslipidemia, hypertension, male sex and a sedentary lifestyle.   Review of systems: Limited as above   Objective:    There were no vitals taken for this visit.  Wt Readings from Last 3 Encounters:  02/19/19 150 lb (68 kg)  02/11/19 152 lb 3.2 oz (69 kg)  01/28/19 152 lb 9.6 oz (69.2 kg)      CMP ( most recent) CMP     Component Value Date/Time   NA 139 02/11/2019 0822   K 3.6 02/11/2019 0822   CL 105 02/11/2019 0822   CO2 23 02/11/2019 0822   GLUCOSE 196 (H) 02/11/2019 0822   BUN 16 02/11/2019 0822   CREATININE 0.61 02/11/2019 0822   CREATININE 0.88 12/11/2018 1018   CALCIUM 8.7 (L) 02/11/2019 0822   PROT 6.7 02/11/2019 0822   ALBUMIN 3.8 02/11/2019 0822   AST 18 02/11/2019 0822   AST 16 12/11/2018 1018   ALT 22 02/11/2019 0822   ALT 19 12/11/2018 1018   ALKPHOS 122 02/11/2019 0822   BILITOT 0.8 02/11/2019 0822   BILITOT 0.8 12/11/2018 1018   GFRNONAA >60 02/11/2019 0822   GFRNONAA >60 12/11/2018 1018   GFRAA >60 02/11/2019 0822   GFRAA >  60 12/11/2018 1018     Diabetic Labs (most recent): Lab Results  Component Value Date   HGBA1C 9.0 (H) 11/12/2018   HGBA1C 6.2 (H)  05/20/2018   HGBA1C 6.5 (H) 03/19/2018     Lipid Panel ( most recent) Lipid Panel     Component Value Date/Time   CHOL 168 01/20/2018 0247   TRIG 51 01/20/2018 0247   HDL 45 01/20/2018 0247   CHOLHDL 3.7 01/20/2018 0247   VLDL 10 01/20/2018 0247   LDLCALC 113 (H) 01/20/2018 0247    Assessment & Plan:   1. Uncontrolled type 2 diabetes mellitus with hyperglycemia (HCC)  - Eli Pattillo has history of uncontrolled diabetes with recent A1c of 9.%.   -more recently patient is diagnosed with pancreatic cancer currently in the middle of chemotherapy. He is on Basaglar 24 units nightly .  He reports that his blood glucose ranges from 148-256, no hypoglycemia.  He believed Lantus was more helpful than Basaglar, however his insurance did not provide coverage for Lantus.   -  Recent labs reviewed.  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 These are all discussed in detail with the patient.  - I have counseled him on diet management  by adopting a carbohydrate restricted/protein rich diet.  -  Suggestion is made for him to introduce more healthier, complex carbohydrates to his diet, as well as introduction of increased protein intake (animal or plant sources, fruits, and vegetables.    - he is advised to stick to a routine mealtimes to eat 3 meals  a day and avoid unnecessary snacks ( to snack only to correct hypoglycemia).    - I have approached him with the following individualized plan to manage diabetes and patient agrees:   -He is not a suitable candidate for oral  antidiabetic medications nor incretin therapy, hence he is advised to discontinue glipizide. -The #1 priority in the treatment of his diabetes will be to avoid hypoglycemia. -Given his pancreatic diabetes, even before he was diagnosed with pancreatic cancer, the best choice of therapy for his diabetes will be exclusively with insulin. -He is advised to increase Basaglar to 38 units nightly, continue to monitor blood glucose 4 times a day-daily before meals and at bedtime.    -Patient is encouraged to call clinic for blood glucose levels less than 70 or above 300 mg /dl.  - Patient specific target  A1c;  LDL, HDL, Triglycerides, and  Waist Circumference were discussed in detail. -He is anti-GAD antibodies, anti-islet cell antibodies are negative indicating his diabetes is not likely to be type I, likely related to his prior alcohol abuse causing pancreatic diabetes.  -The priority in this patient would be to avoid hypoglycemia due to lack of alpha cell  glucagon response on the background of alcohol induced diabetes.  2) BP/HTN: he is advised to home monitor blood pressure and report if > 140/90 on 2 separate readings.   Patient is not on ACE inhibitors nor ARB.  He will be considered for low-dose lisinopril on subsequent visits.    3) Lipids/HPL:   His recent labs show LDL of 113.  I will lower the dose of his Lipitor to 40 mg from 80 mg for subsequent refills.    4)  Weight/Diet: He does not have access weight to lose, CDE Consult will be initiated , exercise, and detailed carbohydrates information provided.  5) hypothyroidism -He is currently on NP thyroid 30 mg daily.  He will have repeat thyroid function test before his next visit.  He will be considered for levothyroxine treatment as necessary after his next visit.  6) Chronic Care/Health Maintenance:  -he  is on Statin medications and  is encouraged to continue to follow up with Ophthalmology, Dentist,  Podiatrist at least yearly or  according to recommendations, and advised to  stay away from smoking. I have recommended yearly flu vaccine and pneumonia vaccination at least every 5 years;  and  sleep for at least 7 hours a day.  - I advised patient to maintain close follow up with Doree Albee, MD for primary care needs.   - - Time spent with the patient: 25 min, of which >50% was spent in reviewing his  current and  previous labs/studies,  His BG reading, previous treatments, and medications doses and developing a plan for long-term care based on the latest recommendations for standards of care. Please refer to " Patient Self Inventory" in the Media  tab for reviewed elements of pertinent patient history. Mark Foster participated in the discussions, expressed understanding, and voiced agreement with the above plans.  All questions were answered to his satisfaction. he is encouraged to contact clinic should he have any questions or concerns prior to his return visit.   Follow up plan: - Return in about 4 weeks (around 03/19/2019) for Follow up with Pre-visit Labs, Meter, and Logs.  Glade Lloyd, MD [facility] [address] [place], [place] [address] Phone: [phone]  Fax: [phone]    02/19/2019, 4:24 PM  This note was partially dictated with voice recognition software. Similar sounding words can be transcribed inadequately or may not  be corrected upon review.
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
## 2019-02-24 ENCOUNTER — Encounter: Payer: Self-pay | Admitting: Orthopedic Surgery

## 2019-02-24 ENCOUNTER — Ambulatory Visit: Payer: PRIVATE HEALTH INSURANCE | Admitting: Orthopedic Surgery

## 2019-02-24 ENCOUNTER — Other Ambulatory Visit: Payer: Self-pay

## 2019-02-24 VITALS — BP 125/71 | HR 85 | Temp 98.8°F | Ht 70.0 in | Wt 150.0 lb

## 2019-02-24 DIAGNOSIS — S99912A Unspecified injury of left ankle, initial encounter: Secondary | ICD-10-CM
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
## 2019-02-24 NOTE — Patient Instructions (Addendum)
Epsom salt soaks 20 min 3 x a day   WEIGHT BEARING AS TOLERATED IN THE BOOT    Avulsion Fracture of the Foot  An avulsion fracture of the foot is a tearing away of a piece of bone in the foot. Bones are connected to other bones by strong bands of tissue (ligaments). Muscles are also connected to bones with strong bands of tissue (tendons). Avulsion fractures occur when severe stress on a ligament or tendon causes a small piece of bone to be pulled away from the main portion of the bone. This is typically caused by trauma or injury. Athletes may develop an avulsion fracture of the foot gradually (chronic avulsion fracture). Common locations of avulsion fracture of the foot are the heel bone and the long bone in the foot that connect to the fifth toe (fifth metatarsal bone). What are the causes? This condition may be caused by:  A sudden or repetitive twisting or rolling of your foot or ankle.  A fall. What increases the risk? You are more likely to develop this condition if:  You participate in activities during which twisting the ankle or foot are likely, such as: ? Dancing. ? Track and field. ? Walking on uneven surfaces.  You have had diabetes for many years.  You have osteoporosis.  You are male and older than age 34. What are the signs or symptoms? The main symptom of this condition is intense pain at the time of injury. You may also feel a pop or tearing. Other signs and symptoms may include:  Swelling in the heel area.  Bruising in the heel and ankle.  The injured area feeling warm to the touch.  Pain with movement or when you use your foot to support your body weight (weight-bearing).  Pain when pressure is applied to the injured area.  Difficulty walking. How is this diagnosed? An avulsion fracture is usually diagnosed with a physical exam and imaging tests, such as:  X-ray. This will show if any bones are fractured or out of place (displaced).  MRI. This will  show your tendons and ligaments. Some avulsion fractures are associated with an injury to a tendon or ligament.  CT scan. This will show a more detailed image of your injury than an X-ray does. How is this treated? Treatment for this condition depends on the size of the torn piece of bone and how far it has been displaced.  Treatments for small avulsion fractures may include: ? Rest, ice, pressure (compression), and raising (elevating) your injured foot (RICE therapy). ? Preventing the injured foot from moving (immobilization) for up to six weeks. This may be done by wearing a boot, a stiff-soled shoe, or a cast. Crutches may also be used to help support your body weight until your foot heals.  Treatment for large avulsion fractures may include: ? Surgery to reattach the bone to the tendon or the ligament. ? Crutches or a rolling scooter to support your body weight until your foot heals.  Other treatments may be recommended, including: ? Physical therapy to regain full use of your foot. This may last for several months. ? Medicines that reduce pain and swelling (NSAIDs). Follow these instructions at home: If you have a cast, boot, or a stiff-soled shoe:  Loosen the boot or shoe if your toes tingle, become numb, or turn cold and blue.  Wear the boot or shoe as told by your health care provider. Remove it only if your health care provider tells  you to do that. Use crutches as told.  Do not stick anything inside the cast to scratch your skin. Doing that increases your risk of infection.  Check the skin around the cast every day. Tell your health care provider about any concerns.  You may put lotion on dry skin around the edges of the cast. Do not put lotion on the skin underneath the cast.  Keep the cast, boot, or shoe clean and dry.  If the cast, boot, or shoe is not waterproof: ? Do not let it get wet. ? Cover it with two layers of watertight covering when you take a bath or a  shower. Managing pain, stiffness, and swelling   Apply ice to the injured area. ? If you have a removable boot or stiff-soled shoe, remove it as told by your health care provider. ? Put ice in a plastic bag. ? Place a towel between your skin and the bag or between your cast and the bag. ? Leave the ice on for 20 minutes, 2-3 times a day.  Take over-the-counter and prescription medicines only as told by your health care provider.  Keep your foot raised above the level of your heart when you are sitting or lying down. General instructions  Rest your foot until your health care provider says that you can resume activity.  Do not use any products that contain nicotine or tobacco, such as cigarettes and e-cigarettes. These can delay bone healing. If you need help quitting, ask your health care provider.  Keep all follow-up visits as directed by your health care provider. This is important. Contact a health care provider if:  Your pain gets worse.  You have chills or a fever.  You have any of these problems with your cast: ? It is damaged. ? It has a bad odor or has stains caused by fluids from the wound. ? It feels like your cast is a bit tight. Get help right away if:  Your foot is cold, blue, or pale.  You have pain, swelling, redness, or numbness below your cast.  You have numbness or tingling in your foot.  You have increased pain in your foot or have pain that is not relieved by pain medicine.  You cannot move your toes or foot. Summary  A trauma or injury may cause an avulsion fracture of the foot, where severe stress on a ligament or tendon causes a small piece of bone to be pulled away from the main portion of the bone.  There are surgical and non-surgical treatment options. You and your health care provider will discuss the type of injury that you have and the options that are best for you.  It is important to keep all follow-up appointments with your health care  provider. This information is not intended to replace advice given to you by your health care provider. Make sure you discuss any questions you have with your health care provider. Document Released: 03/18/2014 Document Revised: 05/24/2017 Document Reviewed: 05/24/2017 Elsevier Interactive Patient Education  2019 [employer].

## 2019-02-25 ENCOUNTER — Encounter (HOSPITAL_COMMUNITY): Payer: Self-pay

## 2019-02-25 ENCOUNTER — Inpatient Hospital Stay (HOSPITAL_COMMUNITY): Payer: PRIVATE HEALTH INSURANCE

## 2019-02-25 ENCOUNTER — Inpatient Hospital Stay (HOSPITAL_BASED_OUTPATIENT_CLINIC_OR_DEPARTMENT_OTHER): Payer: PRIVATE HEALTH INSURANCE | Admitting: Hematology

## 2019-02-25 ENCOUNTER — Encounter (HOSPITAL_COMMUNITY): Payer: Self-pay | Admitting: Hematology

## 2019-02-25 ENCOUNTER — Telehealth: Payer: Self-pay

## 2019-02-25 VITALS — BP 95/63 | HR 76 | Temp 98.0°F | Resp 16

## 2019-02-25 DIAGNOSIS — Z794 Long term (current) use of insulin: Secondary | ICD-10-CM

## 2019-02-25 DIAGNOSIS — Z7982 Long term (current) use of aspirin: Secondary | ICD-10-CM

## 2019-02-25 DIAGNOSIS — Z5111 Encounter for antineoplastic chemotherapy: Secondary | ICD-10-CM | POA: Diagnosis not present

## 2019-02-25 DIAGNOSIS — C259 Malignant neoplasm of pancreas, unspecified: Secondary | ICD-10-CM

## 2019-02-25 DIAGNOSIS — Z95828 Presence of other vascular implants and grafts: Secondary | ICD-10-CM

## 2019-02-25 DIAGNOSIS — Z9081 Acquired absence of spleen: Secondary | ICD-10-CM

## 2019-02-25 DIAGNOSIS — E039 Hypothyroidism, unspecified: Secondary | ICD-10-CM | POA: Diagnosis not present

## 2019-02-25 DIAGNOSIS — C253 Malignant neoplasm of pancreatic duct: Secondary | ICD-10-CM | POA: Diagnosis not present

## 2019-02-25 DIAGNOSIS — Z79899 Other long term (current) drug therapy: Secondary | ICD-10-CM

## 2019-02-25 DIAGNOSIS — Z8673 Personal history of transient ischemic attack (TIA), and cerebral infarction without residual deficits: Secondary | ICD-10-CM | POA: Diagnosis not present

## 2019-02-25 DIAGNOSIS — E1140 Type 2 diabetes mellitus with diabetic neuropathy, unspecified: Secondary | ICD-10-CM

## 2019-02-25 LAB — COMPREHENSIVE METABOLIC PANEL
ALT: 16 U/L (ref 0–44)
AST: 14 U/L — ABNORMAL LOW (ref 15–41)
Albumin: 3.7 g/dL (ref 3.5–5.0)
Alkaline Phosphatase: 179 U/L — ABNORMAL HIGH (ref 38–126)
Anion gap: 10 (ref 5–15)
BUN: 24 mg/dL — ABNORMAL HIGH (ref 8–23)
CO2: 26 mmol/L (ref 22–32)
Calcium: 8.8 mg/dL — ABNORMAL LOW (ref 8.9–10.3)
Chloride: 103 mmol/L (ref 98–111)
Creatinine, Ser: 0.65 mg/dL (ref 0.61–1.24)
GFR calc Af Amer: >60 mL/min (ref >60)
GFR calc non Af Amer: >60 mL/min (ref >60)
Glucose, Bld: 104 mg/dL — ABNORMAL HIGH (ref 70–99)
Potassium: 3.8 mmol/L (ref 3.5–5.1)
Sodium: 139 mmol/L (ref 135–145)
Total Bilirubin: 0.5 mg/dL (ref 0.3–1.2)
Total Protein: 6.5 g/dL (ref 6.5–8.1)

## 2019-02-25 LAB — CBC WITH DIFFERENTIAL/PLATELET
Abs Immature Granulocytes: 0.32 10*3/uL — ABNORMAL HIGH (ref 0.00–0.07)
Basophils Absolute: 0.2 10*3/uL — ABNORMAL HIGH (ref 0.0–0.1)
Basophils Relative: 1 %
Eosinophils Absolute: 0.8 10*3/uL — ABNORMAL HIGH (ref 0.0–0.5)
Eosinophils Relative: 3 %
HCT: 39.6 % (ref 39.0–52.0)
Hemoglobin: 13.3 g/dL (ref 13.0–17.0)
Immature Granulocytes: 1 %
Lymphocytes Relative: 11 %
Lymphs Abs: 2.9 10*3/uL (ref 0.7–4.0)
MCH: 31.4 pg (ref 26.0–34.0)
MCHC: 33.6 g/dL (ref 30.0–36.0)
MCV: 93.6 fL (ref 80.0–100.0)
Monocytes Absolute: 2.4 10*3/uL — ABNORMAL HIGH (ref 0.1–1.0)
Monocytes Relative: 9 %
Neutro Abs: 19.3 10*3/uL — ABNORMAL HIGH (ref 1.7–7.7)
Neutrophils Relative %: 75 %
Platelets: 325 10*3/uL (ref 150–400)
RBC: 4.23 MIL/uL (ref 4.22–5.81)
RDW: 16.6 % — ABNORMAL HIGH (ref 11.5–15.5)
WBC: 25.9 10*3/uL — ABNORMAL HIGH (ref 4.0–10.5)
nRBC: 0.2 % (ref 0.0–0.2)

## 2019-02-25 LAB — HEMOGLOBIN A1C
Hgb A1c MFr Bld: 10.8 % — ABNORMAL HIGH (ref 4.8–5.6)
Mean Plasma Glucose: 263.26 mg/dL

## 2019-02-25 MED ORDER — OXALIPLATIN CHEMO INJECTION 100 MG/20ML
63.7500 mg/m2 | Freq: Once | INTRAVENOUS | Status: AC
  Administered 2019-02-25: 10:00:00 120 mg via INTRAVENOUS
  Filled 2019-02-25: qty 20

## 2019-02-25 MED ORDER — ATROPINE SULFATE 1 MG/ML IJ SOLN
0.5000 mg | Freq: Once | INTRAMUSCULAR | Status: AC | PRN
  Administered 2019-02-25: 0.5 mg via INTRAVENOUS
  Filled 2019-02-25: qty 1

## 2019-02-25 MED ORDER — SODIUM CHLORIDE 0.9% FLUSH
10.0000 mL | INTRAVENOUS | Status: DC | PRN
  Administered 2019-02-25: 09:00:00 10 mL
  Filled 2019-02-25: qty 10

## 2019-02-25 MED ORDER — DEXTROSE 5 % IV SOLN
Freq: Once | INTRAVENOUS | Status: AC
  Administered 2019-02-25: 09:00:00 via INTRAVENOUS

## 2019-02-25 MED ORDER — SODIUM CHLORIDE 0.9 % IV SOLN
150.0000 mg/m2 | Freq: Once | INTRAVENOUS | Status: AC
  Administered 2019-02-25: 280 mg via INTRAVENOUS
  Filled 2019-02-25: qty 4

## 2019-02-25 MED ORDER — PALONOSETRON HCL INJECTION 0.25 MG/5ML
0.2500 mg | Freq: Once | INTRAVENOUS | Status: AC
  Administered 2019-02-25: 0.25 mg via INTRAVENOUS
  Filled 2019-02-25: qty 5

## 2019-02-25 MED ORDER — SODIUM CHLORIDE 0.9 % IV SOLN
Freq: Once | INTRAVENOUS | Status: AC
  Administered 2019-02-25: 09:00:00 via INTRAVENOUS
  Filled 2019-02-25: qty 5

## 2019-02-25 MED ORDER — SODIUM CHLORIDE 0.9 % IV SOLN
376.0000 mg/m2 | Freq: Once | INTRAVENOUS | Status: AC
  Administered 2019-02-25: 700 mg via INTRAVENOUS
  Filled 2019-02-25: qty 35

## 2019-02-25 MED ORDER — SODIUM CHLORIDE 0.9 % IV SOLN
2400.0000 mg/m2 | INTRAVENOUS | Status: DC
  Administered 2019-02-25: 14:00:00 4450 mg via INTRAVENOUS
  Filled 2019-02-25: qty 89

## 2019-02-25 NOTE — Progress Notes (Signed)
Labs drawn from peripheral left arm.  No blood return from port.
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
## 2019-02-25 NOTE — Telephone Encounter (Signed)
I called pt that his appt on Friday will be cancel due to office being closed. Pt is on mychart for [facility]. He gave verbal consent to do video and to file insurance. Pt was r/s and knows the mychart process and log onto account 10 minutes early. I updated pts med list, allergies and pharmacy with pt. Pt verbalized understanding.

## 2019-02-25 NOTE — Patient Instructions (Signed)
[facility] Discharge Instructions for Patients Receiving Chemotherapy  Today you received the following chemotherapy agents   To help prevent nausea and vomiting after your treatment, we encourage you to take your nausea medication   If you develop nausea and vomiting that is not controlled by your nausea medication, call the clinic.   BELOW ARE SYMPTOMS THAT SHOULD BE REPORTED IMMEDIATELY:  *FEVER GREATER THAN 100.5 F  *CHILLS WITH OR WITHOUT FEVER  NAUSEA AND VOMITING THAT IS NOT CONTROLLED WITH YOUR NAUSEA MEDICATION  *UNUSUAL SHORTNESS OF BREATH  *UNUSUAL BRUISING OR BLEEDING  TENDERNESS IN MOUTH AND THROAT WITH OR WITHOUT PRESENCE OF ULCERS  *URINARY PROBLEMS  *BOWEL PROBLEMS  UNUSUAL RASH Items with * indicate a potential emergency and should be followed up as soon as possible.  Feel free to call the clinic should you have any questions or concerns. The clinic phone number is [phone].  Please show the CHEMO ALERT CARD at check-in to the Emergency Department and triage nurse.   
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
## 2019-02-25 NOTE — Progress Notes (Signed)
Labs reviewed with MD today at office visit. No new issues reported by patient. Proceed with treatment today per MD.   Treatment given per orders. Patient tolerated it well without problems. Vitals stable and discharged home from clinic ambulatory. Follow up as scheduled.

## 2019-02-27 ENCOUNTER — Other Ambulatory Visit: Payer: Self-pay

## 2019-02-27 ENCOUNTER — Inpatient Hospital Stay (HOSPITAL_COMMUNITY): Payer: PRIVATE HEALTH INSURANCE

## 2019-02-27 ENCOUNTER — Encounter (HOSPITAL_COMMUNITY): Payer: Self-pay

## 2019-02-27 VITALS — BP 115/72 | HR 88 | Temp 98.0°F | Resp 18

## 2019-02-27 DIAGNOSIS — C259 Malignant neoplasm of pancreas, unspecified: Secondary | ICD-10-CM

## 2019-02-27 DIAGNOSIS — Z95828 Presence of other vascular implants and grafts: Secondary | ICD-10-CM

## 2019-02-27 DIAGNOSIS — Z5111 Encounter for antineoplastic chemotherapy: Secondary | ICD-10-CM | POA: Diagnosis not present

## 2019-02-27 MED ORDER — SODIUM CHLORIDE 0.9% FLUSH
10.0000 mL | INTRAVENOUS | Status: DC | PRN
  Administered 2019-02-27: 10 mL
  Filled 2019-02-27: qty 10

## 2019-02-27 MED ORDER — PEGFILGRASTIM INJECTION 6 MG/0.6ML ~~LOC~~
6.0000 mg | PREFILLED_SYRINGE | Freq: Once | SUBCUTANEOUS | Status: AC
  Administered 2019-02-27: 6 mg via SUBCUTANEOUS

## 2019-02-27 MED ORDER — PEGFILGRASTIM INJECTION 6 MG/0.6ML ~~LOC~~
PREFILLED_SYRINGE | SUBCUTANEOUS | Status: AC
  Filled 2019-02-27: qty 0.6

## 2019-02-27 MED ORDER — HEPARIN SOD (PORK) LOCK FLUSH 100 UNIT/ML IV SOLN
500.0000 [IU] | Freq: Once | INTRAVENOUS | Status: AC | PRN
  Administered 2019-02-27: 500 [IU]

## 2019-02-28 ENCOUNTER — Ambulatory Visit: Payer: BLUE CROSS/BLUE SHIELD | Admitting: Adult Health

## 2019-03-03 ENCOUNTER — Encounter: Payer: Self-pay | Admitting: Adult Health

## 2019-03-03 ENCOUNTER — Telehealth (INDEPENDENT_AMBULATORY_CARE_PROVIDER_SITE_OTHER): Payer: PRIVATE HEALTH INSURANCE | Admitting: Adult Health

## 2019-03-03 ENCOUNTER — Telehealth: Payer: Self-pay | Admitting: Adult Health

## 2019-03-03 VITALS — BP 107/76 | HR 93

## 2019-03-03 DIAGNOSIS — I10 Essential (primary) hypertension: Secondary | ICD-10-CM

## 2019-03-03 DIAGNOSIS — E785 Hyperlipidemia, unspecified: Secondary | ICD-10-CM | POA: Diagnosis not present

## 2019-03-03 DIAGNOSIS — E119 Type 2 diabetes mellitus without complications: Secondary | ICD-10-CM

## 2019-03-03 DIAGNOSIS — R2990 Unspecified symptoms and signs involving the nervous system: Secondary | ICD-10-CM

## 2019-03-03 DIAGNOSIS — I639 Cerebral infarction, unspecified: Secondary | ICD-10-CM | POA: Diagnosis not present

## 2019-03-03 DIAGNOSIS — I6502 Occlusion and stenosis of left vertebral artery: Secondary | ICD-10-CM

## 2019-03-03 DIAGNOSIS — Z9889 Other specified postprocedural states: Secondary | ICD-10-CM

## 2019-03-03 NOTE — Telephone Encounter (Signed)
Completed.  Thank you! 

## 2019-03-03 NOTE — Telephone Encounter (Signed)
When you get a chance can you put a new CT orders in because due to the patient insurance he can not have it at GI.
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
## 2019-03-03 NOTE — Addendum Note (Signed)
Addended by: Venancio Poisson on: 03/03/2019 04:15 PM   Modules accepted: Orders

## 2019-03-04 NOTE — Telephone Encounter (Signed)
[facility] [place] pending faxed notes.  ?

## 2019-03-04 NOTE — Progress Notes (Signed)
I agree with the above plan 

## 2019-03-10 NOTE — Telephone Encounter (Signed)
The CT Angio Head was approved. Auth: [id number] (exp. 03/05/19 to 07/03/19)  The CT Angio Neck was denied. "Based on what was provided, problem with the blood vessels in your neck, your doctor's request cannot be approved. Before we can review for an approval, notes with sound wave pictures of your neck blood vessels (carotid doppler). The sound was picture should show why another test is needed (70% or more stenosis if asymptomatic or 50% or more if symptomatic) should be provided. This was not included in the information we received .  A peer to peer is an option. The phone number is [phone] option 3. The tracking number is [id number]. The deadline to do the peer to peer is Thursday 03/13/19.

## 2019-03-11 ENCOUNTER — Inpatient Hospital Stay (HOSPITAL_COMMUNITY): Payer: PRIVATE HEALTH INSURANCE

## 2019-03-11 ENCOUNTER — Inpatient Hospital Stay (HOSPITAL_BASED_OUTPATIENT_CLINIC_OR_DEPARTMENT_OTHER): Payer: PRIVATE HEALTH INSURANCE | Admitting: Hematology

## 2019-03-11 ENCOUNTER — Inpatient Hospital Stay (HOSPITAL_COMMUNITY): Payer: PRIVATE HEALTH INSURANCE | Attending: Hematology

## 2019-03-11 ENCOUNTER — Encounter (HOSPITAL_COMMUNITY): Payer: Self-pay | Admitting: Hematology

## 2019-03-11 ENCOUNTER — Other Ambulatory Visit: Payer: Self-pay

## 2019-03-11 VITALS — BP 135/64 | HR 65 | Temp 97.1°F | Resp 18

## 2019-03-11 DIAGNOSIS — E039 Hypothyroidism, unspecified: Secondary | ICD-10-CM | POA: Diagnosis not present

## 2019-03-11 DIAGNOSIS — Z95828 Presence of other vascular implants and grafts: Secondary | ICD-10-CM

## 2019-03-11 DIAGNOSIS — C253 Malignant neoplasm of pancreatic duct: Secondary | ICD-10-CM | POA: Insufficient documentation

## 2019-03-11 DIAGNOSIS — Z79899 Other long term (current) drug therapy: Secondary | ICD-10-CM

## 2019-03-11 DIAGNOSIS — Z794 Long term (current) use of insulin: Secondary | ICD-10-CM | POA: Diagnosis not present

## 2019-03-11 DIAGNOSIS — E119 Type 2 diabetes mellitus without complications: Secondary | ICD-10-CM | POA: Insufficient documentation

## 2019-03-11 DIAGNOSIS — C259 Malignant neoplasm of pancreas, unspecified: Secondary | ICD-10-CM

## 2019-03-11 DIAGNOSIS — G629 Polyneuropathy, unspecified: Secondary | ICD-10-CM | POA: Diagnosis not present

## 2019-03-11 DIAGNOSIS — Z5189 Encounter for other specified aftercare: Secondary | ICD-10-CM | POA: Diagnosis not present

## 2019-03-11 DIAGNOSIS — Z800 Family history of malignant neoplasm of digestive organs: Secondary | ICD-10-CM

## 2019-03-11 DIAGNOSIS — Z5111 Encounter for antineoplastic chemotherapy: Secondary | ICD-10-CM | POA: Diagnosis present

## 2019-03-11 LAB — CBC WITH DIFFERENTIAL/PLATELET
Abs Immature Granulocytes: 0.08 10*3/uL — ABNORMAL HIGH (ref 0.00–0.07)
Basophils Absolute: 0.1 10*3/uL (ref 0.0–0.1)
Basophils Relative: 1 %
Eosinophils Absolute: 0.5 10*3/uL (ref 0.0–0.5)
Eosinophils Relative: 4 %
HCT: 39 % (ref 39.0–52.0)
Hemoglobin: 13.1 g/dL (ref 13.0–17.0)
Immature Granulocytes: 1 %
Lymphocytes Relative: 14 %
Lymphs Abs: 1.9 10*3/uL (ref 0.7–4.0)
MCH: 31 pg (ref 26.0–34.0)
MCHC: 33.6 g/dL (ref 30.0–36.0)
MCV: 92.2 fL (ref 80.0–100.0)
Monocytes Absolute: 1.7 10*3/uL — ABNORMAL HIGH (ref 0.1–1.0)
Monocytes Relative: 13 %
Neutro Abs: 9.2 10*3/uL — ABNORMAL HIGH (ref 1.7–7.7)
Neutrophils Relative %: 67 %
Platelets: 293 10*3/uL (ref 150–400)
RBC: 4.23 MIL/uL (ref 4.22–5.81)
RDW: 17.4 % — ABNORMAL HIGH (ref 11.5–15.5)
WBC: 13.5 10*3/uL — ABNORMAL HIGH (ref 4.0–10.5)
nRBC: 0 % (ref 0.0–0.2)

## 2019-03-11 LAB — COMPREHENSIVE METABOLIC PANEL
ALT: 23 U/L (ref 0–44)
AST: 17 U/L (ref 15–41)
Albumin: 4.2 g/dL (ref 3.5–5.0)
Alkaline Phosphatase: 168 U/L — ABNORMAL HIGH (ref 38–126)
Anion gap: 12 (ref 5–15)
BUN: 13 mg/dL (ref 8–23)
CO2: 25 mmol/L (ref 22–32)
Calcium: 9 mg/dL (ref 8.9–10.3)
Chloride: 102 mmol/L (ref 98–111)
Creatinine, Ser: 0.59 mg/dL — ABNORMAL LOW (ref 0.61–1.24)
GFR calc Af Amer: >60 mL/min (ref >60)
GFR calc non Af Amer: >60 mL/min (ref >60)
Glucose, Bld: 172 mg/dL — ABNORMAL HIGH (ref 70–99)
Potassium: 3.2 mmol/L — ABNORMAL LOW (ref 3.5–5.1)
Sodium: 139 mmol/L (ref 135–145)
Total Bilirubin: 0.8 mg/dL (ref 0.3–1.2)
Total Protein: 6.8 g/dL (ref 6.5–8.1)

## 2019-03-11 MED ORDER — ATROPINE SULFATE 1 MG/ML IJ SOLN
0.5000 mg | Freq: Once | INTRAMUSCULAR | Status: AC | PRN
  Administered 2019-03-11: 0.5 mg via INTRAVENOUS
  Filled 2019-03-11: qty 1

## 2019-03-11 MED ORDER — DEXTROSE 5 % IV SOLN
Freq: Once | INTRAVENOUS | Status: AC
  Administered 2019-03-11: 10:00:00 via INTRAVENOUS

## 2019-03-11 MED ORDER — SODIUM CHLORIDE 0.9 % IV SOLN
150.0000 mg/m2 | Freq: Once | INTRAVENOUS | Status: AC
  Administered 2019-03-11: 280 mg via INTRAVENOUS
  Filled 2019-03-11: qty 4

## 2019-03-11 MED ORDER — OXALIPLATIN CHEMO INJECTION 100 MG/20ML
63.7500 mg/m2 | Freq: Once | INTRAVENOUS | Status: AC
  Administered 2019-03-11: 120 mg via INTRAVENOUS
  Filled 2019-03-11: qty 20

## 2019-03-11 MED ORDER — PALONOSETRON HCL INJECTION 0.25 MG/5ML
0.2500 mg | Freq: Once | INTRAVENOUS | Status: AC
  Administered 2019-03-11: 0.25 mg via INTRAVENOUS

## 2019-03-11 MED ORDER — POTASSIUM CHLORIDE CRYS ER 20 MEQ PO TBCR
EXTENDED_RELEASE_TABLET | ORAL | Status: AC
  Filled 2019-03-11: qty 2

## 2019-03-11 MED ORDER — HEPARIN SOD (PORK) LOCK FLUSH 100 UNIT/ML IV SOLN: 500.0000 [IU] | Freq: Once | INTRAVENOUS | Status: DC | PRN

## 2019-03-11 MED ORDER — SODIUM CHLORIDE 0.9 % IV SOLN
Freq: Once | INTRAVENOUS | Status: AC
  Administered 2019-03-11: 11:00:00 via INTRAVENOUS
  Filled 2019-03-11: qty 5

## 2019-03-11 MED ORDER — SODIUM CHLORIDE 0.9 % IV SOLN
2400.0000 mg/m2 | INTRAVENOUS | Status: DC
  Administered 2019-03-11: 4450 mg via INTRAVENOUS
  Filled 2019-03-11: qty 89

## 2019-03-11 MED ORDER — PALONOSETRON HCL INJECTION 0.25 MG/5ML
INTRAVENOUS | Status: AC
  Filled 2019-03-11: qty 5

## 2019-03-11 MED ORDER — SODIUM CHLORIDE 0.9% FLUSH
10.0000 mL | INTRAVENOUS | Status: DC | PRN
  Administered 2019-03-11: 10 mL
  Filled 2019-03-11: qty 10

## 2019-03-11 MED ORDER — SODIUM CHLORIDE 0.9 % IV SOLN
376.0000 mg/m2 | Freq: Once | INTRAVENOUS | Status: AC
  Administered 2019-03-11: 700 mg via INTRAVENOUS
  Filled 2019-03-11: qty 35

## 2019-03-11 MED ORDER — POTASSIUM CHLORIDE CRYS ER 20 MEQ PO TBCR
40.0000 meq | EXTENDED_RELEASE_TABLET | Freq: Once | ORAL | Status: AC
  Administered 2019-03-11: 40 meq via ORAL
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
## 2019-03-11 NOTE — Progress Notes (Signed)
Labs reviewed with MD, will proceed today with treatment and give PO potassium per orders.   Treatment given per orders. Patient tolerated it well without problems. Vitals stable and discharged home from clinic ambulatory. Follow up as scheduled.
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
## 2019-03-11 NOTE — Patient Instructions (Addendum)
[place] at [facility] Discharge Instructions  You were seen today by Dr. Delton Coombes. He went over your recent lab results and how you have been feeling.  He said that he is happy with how things are going.  He talked to you about how to keep your mouth moist.  This will help with the soreness in your mouth. He will see you back in 2 weeks for next treatment, labs and follow up.   Thank you for choosing [place] at [facility] to provide your oncology and hematology care.  To afford each patient quality time with our provider, please arrive at least 15 minutes before your scheduled appointment time.   If you have a lab appointment with the [facility] please come in thru the  Main Entrance and check in at the main information desk  You need to re-schedule your appointment should you arrive 10 or more minutes late.  We strive to give you quality time with our providers, and arriving late affects you and other patients whose appointments are after yours.  Also, if you no show three or more times for appointments you may be dismissed from the clinic at the providers discretion.     Again, thank you for choosing [facility].  Our hope is that these requests will decrease the amount of time that you wait before being seen by our physicians.       _____________________________________________________________  Should you have questions after your visit to [facility], please contact our office at [phone] between the hours of 8:00 a.m. and 4:30 p.m.  Voicemails left after 4:00 p.m. will not be returned until the following business day.  For prescription refill requests, have your pharmacy contact our office and allow 72 hours.    Cancer Center Support Programs:   > Cancer Support Group  2nd Tuesday of the month 1pm-2pm, Journey Room

## 2019-03-11 NOTE — Patient Instructions (Signed)
[facility] Discharge Instructions for Patients Receiving Chemotherapy  Today you received the following chemotherapy agents   To help prevent nausea and vomiting after your treatment, we encourage you to take your nausea medication   If you develop nausea and vomiting that is not controlled by your nausea medication, call the clinic.   BELOW ARE SYMPTOMS THAT SHOULD BE REPORTED IMMEDIATELY:  *FEVER GREATER THAN 100.5 F  *CHILLS WITH OR WITHOUT FEVER  NAUSEA AND VOMITING THAT IS NOT CONTROLLED WITH YOUR NAUSEA MEDICATION  *UNUSUAL SHORTNESS OF BREATH  *UNUSUAL BRUISING OR BLEEDING  TENDERNESS IN MOUTH AND THROAT WITH OR WITHOUT PRESENCE OF ULCERS  *URINARY PROBLEMS  *BOWEL PROBLEMS  UNUSUAL RASH Items with * indicate a potential emergency and should be followed up as soon as possible.  Feel free to call the clinic should you have any questions or concerns. The clinic phone number is [phone].  Please show the CHEMO ALERT CARD at check-in to the Emergency Department and triage nurse.   

## 2019-03-12 NOTE — Telephone Encounter (Signed)
Rn notified Dr.Sethi pt needs peer to peer for images by 03/13/2019.

## 2019-03-13 ENCOUNTER — Other Ambulatory Visit: Payer: Self-pay

## 2019-03-13 ENCOUNTER — Inpatient Hospital Stay (HOSPITAL_COMMUNITY): Payer: PRIVATE HEALTH INSURANCE

## 2019-03-13 ENCOUNTER — Encounter (HOSPITAL_COMMUNITY): Payer: Self-pay

## 2019-03-13 VITALS — BP 97/65 | HR 80 | Temp 97.5°F | Resp 18

## 2019-03-13 DIAGNOSIS — C259 Malignant neoplasm of pancreas, unspecified: Secondary | ICD-10-CM

## 2019-03-13 DIAGNOSIS — Z5111 Encounter for antineoplastic chemotherapy: Secondary | ICD-10-CM | POA: Diagnosis not present

## 2019-03-13 DIAGNOSIS — Z95828 Presence of other vascular implants and grafts: Secondary | ICD-10-CM

## 2019-03-13 MED ORDER — SODIUM CHLORIDE 0.9% FLUSH
10.0000 mL | INTRAVENOUS | Status: DC | PRN
  Administered 2019-03-13: 14:00:00 10 mL
  Filled 2019-03-13: qty 10

## 2019-03-13 MED ORDER — PEGFILGRASTIM INJECTION 6 MG/0.6ML ~~LOC~~
6.0000 mg | PREFILLED_SYRINGE | Freq: Once | SUBCUTANEOUS | Status: AC
  Administered 2019-03-13: 6 mg via SUBCUTANEOUS

## 2019-03-13 MED ORDER — HEPARIN SOD (PORK) LOCK FLUSH 100 UNIT/ML IV SOLN
500.0000 [IU] | Freq: Once | INTRAVENOUS | Status: AC | PRN
  Administered 2019-03-13: 500 [IU]

## 2019-03-13 MED ORDER — PEGFILGRASTIM INJECTION 6 MG/0.6ML ~~LOC~~
PREFILLED_SYRINGE | SUBCUTANEOUS | Status: AC
  Filled 2019-03-13: qty 0.6

## 2019-03-13 NOTE — Progress Notes (Signed)
Patient to treatment area for chemo pump disconnect.  Port site clean and dry with no bruising or swelling noted at site.  Good blood return noted.  Band aid applied.  VSs with discharge and left ambulatory with no s/s of distress noted.

## 2019-03-13 NOTE — Telephone Encounter (Signed)
appoved CTA neck auth # [id number]  CTA brain auth # [id number]

## 2019-03-17 NOTE — Telephone Encounter (Signed)
Noted, thank you

## 2019-03-17 NOTE — Telephone Encounter (Signed)
ambetter of [place] auth: [id number] (exp. 03/05/19 to 07/03/19) for the CT Angio Head  Auth: [id number] (exp. 03/13/19 to 07/11/19) for the CT Angio Neck.   Patient is scheduled at [facility] for 04/03/19 arrival time is 9:45 AM. I left a detail vmail informing him this. I also left their number of [phone] incase he needed to r/s. Started at 6 AM water only and for him to wear a mask.

## 2019-03-24 ENCOUNTER — Other Ambulatory Visit: Payer: Self-pay

## 2019-03-24 ENCOUNTER — Ambulatory Visit: Payer: PRIVATE HEALTH INSURANCE | Admitting: "Endocrinology

## 2019-03-24 DIAGNOSIS — S99912A Unspecified injury of left ankle, initial encounter: Secondary | ICD-10-CM | POA: Insufficient documentation

## 2019-03-25 ENCOUNTER — Inpatient Hospital Stay (HOSPITAL_COMMUNITY): Payer: PRIVATE HEALTH INSURANCE

## 2019-03-25 ENCOUNTER — Encounter (HOSPITAL_COMMUNITY): Payer: Self-pay | Admitting: Hematology

## 2019-03-25 ENCOUNTER — Other Ambulatory Visit: Payer: Self-pay

## 2019-03-25 ENCOUNTER — Inpatient Hospital Stay (HOSPITAL_BASED_OUTPATIENT_CLINIC_OR_DEPARTMENT_OTHER): Payer: PRIVATE HEALTH INSURANCE | Admitting: Hematology

## 2019-03-25 VITALS — BP 126/68 | HR 78

## 2019-03-25 DIAGNOSIS — E039 Hypothyroidism, unspecified: Secondary | ICD-10-CM

## 2019-03-25 DIAGNOSIS — C253 Malignant neoplasm of pancreatic duct: Secondary | ICD-10-CM

## 2019-03-25 DIAGNOSIS — Z5111 Encounter for antineoplastic chemotherapy: Secondary | ICD-10-CM | POA: Diagnosis not present

## 2019-03-25 DIAGNOSIS — Z800 Family history of malignant neoplasm of digestive organs: Secondary | ICD-10-CM

## 2019-03-25 DIAGNOSIS — C259 Malignant neoplasm of pancreas, unspecified: Secondary | ICD-10-CM

## 2019-03-25 DIAGNOSIS — Z95828 Presence of other vascular implants and grafts: Secondary | ICD-10-CM

## 2019-03-25 DIAGNOSIS — E119 Type 2 diabetes mellitus without complications: Secondary | ICD-10-CM

## 2019-03-25 DIAGNOSIS — Z79899 Other long term (current) drug therapy: Secondary | ICD-10-CM

## 2019-03-25 DIAGNOSIS — Z794 Long term (current) use of insulin: Secondary | ICD-10-CM

## 2019-03-25 DIAGNOSIS — G629 Polyneuropathy, unspecified: Secondary | ICD-10-CM | POA: Diagnosis not present

## 2019-03-25 LAB — COMPREHENSIVE METABOLIC PANEL
ALT: 21 U/L (ref 0–44)
AST: 17 U/L (ref 15–41)
Albumin: 4 g/dL (ref 3.5–5.0)
Alkaline Phosphatase: 190 U/L — ABNORMAL HIGH (ref 38–126)
Anion gap: 8 (ref 5–15)
BUN: 18 mg/dL (ref 8–23)
CO2: 25 mmol/L (ref 22–32)
Calcium: 9 mg/dL (ref 8.9–10.3)
Chloride: 107 mmol/L (ref 98–111)
Creatinine, Ser: 0.55 mg/dL — ABNORMAL LOW (ref 0.61–1.24)
GFR calc Af Amer: >60 mL/min (ref >60)
GFR calc non Af Amer: >60 mL/min (ref >60)
Glucose, Bld: 210 mg/dL — ABNORMAL HIGH (ref 70–99)
Potassium: 3.9 mmol/L (ref 3.5–5.1)
Sodium: 140 mmol/L (ref 135–145)
Total Bilirubin: 0.7 mg/dL (ref 0.3–1.2)
Total Protein: 6.8 g/dL (ref 6.5–8.1)

## 2019-03-25 LAB — CBC WITH DIFFERENTIAL/PLATELET
Abs Immature Granulocytes: 0.13 10*3/uL — ABNORMAL HIGH (ref 0.00–0.07)
Basophils Absolute: 0.1 10*3/uL (ref 0.0–0.1)
Basophils Relative: 1 %
Eosinophils Absolute: 0.5 10*3/uL (ref 0.0–0.5)
Eosinophils Relative: 3 %
HCT: 41.9 % (ref 39.0–52.0)
Hemoglobin: 13.7 g/dL (ref 13.0–17.0)
Immature Granulocytes: 1 %
Lymphocytes Relative: 17 %
Lymphs Abs: 2.8 10*3/uL (ref 0.7–4.0)
MCH: 30.7 pg (ref 26.0–34.0)
MCHC: 32.7 g/dL (ref 30.0–36.0)
MCV: 93.9 fL (ref 80.0–100.0)
Monocytes Absolute: 1.5 10*3/uL — ABNORMAL HIGH (ref 0.1–1.0)
Monocytes Relative: 10 %
Neutro Abs: 10.8 10*3/uL — ABNORMAL HIGH (ref 1.7–7.7)
Neutrophils Relative %: 68 %
Platelets: 302 10*3/uL (ref 150–400)
RBC: 4.46 MIL/uL (ref 4.22–5.81)
RDW: 18.9 % — ABNORMAL HIGH (ref 11.5–15.5)
WBC: 15.8 10*3/uL — ABNORMAL HIGH (ref 4.0–10.5)
nRBC: 0.1 % (ref 0.0–0.2)

## 2019-03-25 MED ORDER — DEXTROSE 5 % IV SOLN
Freq: Once | INTRAVENOUS | Status: AC
  Administered 2019-03-25: 10:00:00 via INTRAVENOUS

## 2019-03-25 MED ORDER — SODIUM CHLORIDE 0.9 % IV SOLN
376.0000 mg/m2 | Freq: Once | INTRAVENOUS | Status: AC
  Administered 2019-03-25: 700 mg via INTRAVENOUS
  Filled 2019-03-25: qty 35

## 2019-03-25 MED ORDER — HEPARIN SOD (PORK) LOCK FLUSH 100 UNIT/ML IV SOLN: 500.0000 [IU] | Freq: Once | INTRAVENOUS | Status: DC | PRN

## 2019-03-25 MED ORDER — PALONOSETRON HCL INJECTION 0.25 MG/5ML
0.2500 mg | Freq: Once | INTRAVENOUS | Status: AC
  Administered 2019-03-25: 0.25 mg via INTRAVENOUS
  Filled 2019-03-25: qty 5

## 2019-03-25 MED ORDER — SODIUM CHLORIDE 0.9% FLUSH
10.0000 mL | INTRAVENOUS | Status: DC | PRN
  Administered 2019-03-25: 10 mL
  Filled 2019-03-25: qty 10

## 2019-03-25 MED ORDER — OXALIPLATIN CHEMO INJECTION 100 MG/20ML
63.7500 mg/m2 | Freq: Once | INTRAVENOUS | Status: AC
  Administered 2019-03-25: 120 mg via INTRAVENOUS
  Filled 2019-03-25: qty 4

## 2019-03-25 MED ORDER — SODIUM CHLORIDE 0.9 % IV SOLN
150.0000 mg/m2 | Freq: Once | INTRAVENOUS | Status: AC
  Administered 2019-03-25: 280 mg via INTRAVENOUS
  Filled 2019-03-25: qty 4

## 2019-03-25 MED ORDER — DEXTROSE 5 % IV SOLN
Freq: Once | INTRAVENOUS | Status: AC
  Administered 2019-03-25: 11:00:00 via INTRAVENOUS

## 2019-03-25 MED ORDER — ATROPINE SULFATE 1 MG/ML IJ SOLN
0.5000 mg | Freq: Once | INTRAMUSCULAR | Status: AC | PRN
  Administered 2019-03-25: 0.5 mg via INTRAVENOUS
  Filled 2019-03-25: qty 1

## 2019-03-25 MED ORDER — SODIUM CHLORIDE 0.9 % IV SOLN
Freq: Once | INTRAVENOUS | Status: AC
  Administered 2019-03-25: 11:00:00 via INTRAVENOUS
  Filled 2019-03-25: qty 5

## 2019-03-25 MED ORDER — SODIUM CHLORIDE 0.9 % IV SOLN
2400.0000 mg/m2 | INTRAVENOUS | Status: DC
  Administered 2019-03-25: 4450 mg via INTRAVENOUS
  Filled 2019-03-25: qty 89
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
## 2019-03-25 NOTE — Patient Instructions (Signed)
[facility] Discharge Instructions for Patients Receiving Chemotherapy   Beginning January 23rd 2017 lab work for the Cancer Center will be done in the  Main lab at [facility] on 1st floor. If you have a lab appointment with the Cancer Center please come in thru the  Main Entrance and check in at the main information desk   Today you received the following chemotherapy agents FOLFIRINOX  To help prevent nausea and vomiting after your treatment, we encourage you to take your nausea medication If you develop nausea and vomiting, or diarrhea that is not controlled by your medication, call the clinic.  The clinic phone number is [phone]. Office hours are Monday-Friday 8:30am-5:00pm.  BELOW ARE SYMPTOMS THAT SHOULD BE REPORTED IMMEDIATELY:  *FEVER GREATER THAN 101.0 F  *CHILLS WITH OR WITHOUT FEVER  NAUSEA AND VOMITING THAT IS NOT CONTROLLED WITH YOUR NAUSEA MEDICATION  *UNUSUAL SHORTNESS OF BREATH  *UNUSUAL BRUISING OR BLEEDING  TENDERNESS IN MOUTH AND THROAT WITH OR WITHOUT PRESENCE OF ULCERS  *URINARY PROBLEMS  *BOWEL PROBLEMS  UNUSUAL RASH Items with * indicate a potential emergency and should be followed up as soon as possible. If you have an emergency after office hours please contact your primary care physician or go to the nearest emergency department.  Please call the clinic during office hours if you have any questions or concerns.   You may also contact the Patient Navigator at [phone] should you have any questions or need assistance in obtaining follow up care.      Resources For Cancer Patients and their Caregivers ? American Cancer Society: Can assist with transportation, wigs, general needs, runs Look Good Feel Better.        1-888-227-6333 ? Cancer Care: Provides financial assistance, online support groups, medication/co-pay assistance.  1-800-813-HOPE (4673) ? [facility] Assists [place] cancer  patients and their families through emotional , educational and financial support.  336-427-4357 ? [place] DSS Where to apply for food stamps, Medicaid and utility assistance. 336-342-1394 ? [facility]: Transportation to medical appointments. 336-347-2287 ? Social Security Administration: May apply for disability if have a Stage IV cancer. 336-342-7796 1-800-772-1213 ? [place] Aging, Disability and Transit Services: Assists with nutrition, care and transit needs. 336-349-2343          
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
## 2019-03-25 NOTE — Progress Notes (Signed)
Mark Foster tolerated FOLFIRINOX without incident or complaint. VSS. Discharged with 5FU infusing through home infusion pump without difficulties.

## 2019-03-25 NOTE — Progress Notes (Signed)
Patient seen by Dr. Delton Coombes with lab review and ok to treat today verbal order.

## 2019-03-25 NOTE — Patient Instructions (Addendum)
[facility] at [facility] Discharge Instructions  You were seen today by Dr. Katragadda. He went over your recent lab results. He will see you back in 2 weeks for labs and follow up.   Thank you for choosing [facility] at [facility] to provide your oncology and hematology care.  To afford each patient quality time with our provider, please arrive at least 15 minutes before your scheduled appointment time.   If you have a lab appointment with the Cancer Center please come in thru the  Main Entrance and check in at the main information desk  You need to re-schedule your appointment should you arrive 10 or more minutes late.  We strive to give you quality time with our providers, and arriving late affects you and other patients whose appointments are after yours.  Also, if you no show three or more times for appointments you may be dismissed from the clinic at the providers discretion.     Again, thank you for choosing [facility].  Our hope is that these requests will decrease the amount of time that you wait before being seen by our physicians.       _____________________________________________________________  Should you have questions after your visit to [facility], please contact our office at [phone] between the hours of 8:00 a.m. and 4:30 p.m.  Voicemails left after 4:00 p.m. will not be returned until the following business day.  For prescription refill requests, have your pharmacy contact our office and allow 72 hours.    Cancer Center Support Programs:   > Cancer Support Group  2nd Tuesday of the month 1pm-2pm, [facility] Room    

## 2019-03-26 ENCOUNTER — Ambulatory Visit (INDEPENDENT_AMBULATORY_CARE_PROVIDER_SITE_OTHER): Payer: PRIVATE HEALTH INSURANCE | Admitting: Orthopedic Surgery

## 2019-03-26 ENCOUNTER — Encounter: Payer: Self-pay | Admitting: Orthopedic Surgery

## 2019-03-26 VITALS — Temp 97.7°F | Resp 16 | Ht 70.0 in | Wt 151.0 lb

## 2019-03-26 DIAGNOSIS — S93412D Sprain of calcaneofibular ligament of left ankle, subsequent encounter: Secondary | ICD-10-CM | POA: Diagnosis not present

## 2019-03-26 DIAGNOSIS — S99912D Unspecified injury of left ankle, subsequent encounter: Secondary | ICD-10-CM | POA: Diagnosis not present

## 2019-03-26 NOTE — Patient Instructions (Signed)
Wear ankle lace up brace for more weeks  Start ankle exercises once a day for 4 weeks  Follow-up 4 weeks
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
## 2019-03-27 ENCOUNTER — Other Ambulatory Visit: Payer: Self-pay

## 2019-03-27 ENCOUNTER — Encounter (HOSPITAL_COMMUNITY): Payer: Self-pay

## 2019-03-27 ENCOUNTER — Inpatient Hospital Stay (HOSPITAL_COMMUNITY): Payer: PRIVATE HEALTH INSURANCE

## 2019-03-27 VITALS — BP 103/67 | HR 81 | Temp 99.1°F | Resp 18

## 2019-03-27 DIAGNOSIS — C259 Malignant neoplasm of pancreas, unspecified: Secondary | ICD-10-CM

## 2019-03-27 DIAGNOSIS — Z5111 Encounter for antineoplastic chemotherapy: Secondary | ICD-10-CM | POA: Diagnosis not present

## 2019-03-27 DIAGNOSIS — Z95828 Presence of other vascular implants and grafts: Secondary | ICD-10-CM

## 2019-03-27 MED ORDER — SODIUM CHLORIDE 0.9% FLUSH
10.0000 mL | INTRAVENOUS | Status: DC | PRN
  Administered 2019-03-27: 14:00:00 10 mL
  Filled 2019-03-27: qty 10

## 2019-03-27 MED ORDER — HEPARIN SOD (PORK) LOCK FLUSH 100 UNIT/ML IV SOLN
500.0000 [IU] | Freq: Once | INTRAVENOUS | Status: AC | PRN
  Administered 2019-03-27: 500 [IU]

## 2019-03-27 MED ORDER — PEGFILGRASTIM INJECTION 6 MG/0.6ML ~~LOC~~
6.0000 mg | PREFILLED_SYRINGE | Freq: Once | SUBCUTANEOUS | Status: AC
  Administered 2019-03-27: 6 mg via SUBCUTANEOUS
  Filled 2019-03-27: qty 0.6

## 2019-03-27 NOTE — Progress Notes (Signed)
Patients port flushed without difficulty.  Good blood return noted before and after flush. No complaints of pain with flush and no bruising or swelling noted at site.  Band aid applied.  VSS with discharge and left ambulatory with no s/s of distress noted.  

## 2019-04-03 ENCOUNTER — Ambulatory Visit (HOSPITAL_COMMUNITY)
Admission: RE | Admit: 2019-04-03 | Discharge: 2019-04-03 | Disposition: A | Discharge status: Home / Self Care | Payer: PRIVATE HEALTH INSURANCE | Source: Ambulatory Visit | Attending: Adult Health | Admitting: Adult Health

## 2019-04-03 ENCOUNTER — Other Ambulatory Visit: Payer: Self-pay

## 2019-04-03 DIAGNOSIS — I6502 Occlusion and stenosis of left vertebral artery: Secondary | ICD-10-CM | POA: Insufficient documentation

## 2019-04-03 DIAGNOSIS — Z9889 Other specified postprocedural states: Secondary | ICD-10-CM | POA: Insufficient documentation

## 2019-04-03 MED ORDER — IOHEXOL 350 MG/ML SOLN
100.0000 mL | Freq: Once | INTRAVENOUS | Status: AC | PRN
  Administered 2019-04-03: 100 mL via INTRAVENOUS

## 2019-04-08 ENCOUNTER — Telehealth (HOSPITAL_COMMUNITY): Payer: Self-pay

## 2019-04-08 ENCOUNTER — Ambulatory Visit (HOSPITAL_COMMUNITY): Payer: PRIVATE HEALTH INSURANCE | Admitting: Hematology

## 2019-04-08 ENCOUNTER — Ambulatory Visit (HOSPITAL_COMMUNITY): Payer: PRIVATE HEALTH INSURANCE

## 2019-04-08 ENCOUNTER — Other Ambulatory Visit (HOSPITAL_COMMUNITY): Payer: PRIVATE HEALTH INSURANCE

## 2019-04-08 NOTE — Telephone Encounter (Signed)
Called, left message for pt to f/u in 6 months, no answer, left vm. AW

## 2019-04-08 NOTE — Progress Notes (Signed)
..  The following biosimilar pegfilgrastim-jmdb Brendolyn Patty) has been selected for use in this patient. Treatment plan has been modified to reflect addition of new medication.  Prior authorization is complete and approved by insurance.  Thank you for the opportunity to participate in Mr Massie's care.  Henreitta Leber, PharmD

## 2019-04-09 ENCOUNTER — Inpatient Hospital Stay (HOSPITAL_COMMUNITY): Payer: PRIVATE HEALTH INSURANCE | Attending: Hematology

## 2019-04-09 ENCOUNTER — Encounter (HOSPITAL_COMMUNITY): Payer: Self-pay | Admitting: Hematology

## 2019-04-09 ENCOUNTER — Other Ambulatory Visit: Payer: Self-pay

## 2019-04-09 ENCOUNTER — Inpatient Hospital Stay (HOSPITAL_BASED_OUTPATIENT_CLINIC_OR_DEPARTMENT_OTHER): Payer: PRIVATE HEALTH INSURANCE | Admitting: Hematology

## 2019-04-09 ENCOUNTER — Inpatient Hospital Stay (HOSPITAL_COMMUNITY): Payer: PRIVATE HEALTH INSURANCE

## 2019-04-09 VITALS — BP 123/59 | HR 83 | Temp 97.7°F | Resp 18 | Wt 151.4 lb

## 2019-04-09 VITALS — BP 105/55 | HR 80 | Resp 18

## 2019-04-09 DIAGNOSIS — Z95828 Presence of other vascular implants and grafts: Secondary | ICD-10-CM

## 2019-04-09 DIAGNOSIS — C259 Malignant neoplasm of pancreas, unspecified: Secondary | ICD-10-CM

## 2019-04-09 DIAGNOSIS — E1140 Type 2 diabetes mellitus with diabetic neuropathy, unspecified: Secondary | ICD-10-CM | POA: Diagnosis not present

## 2019-04-09 DIAGNOSIS — E039 Hypothyroidism, unspecified: Secondary | ICD-10-CM | POA: Diagnosis not present

## 2019-04-09 DIAGNOSIS — Z9081 Acquired absence of spleen: Secondary | ICD-10-CM | POA: Diagnosis not present

## 2019-04-09 DIAGNOSIS — Z5111 Encounter for antineoplastic chemotherapy: Secondary | ICD-10-CM | POA: Diagnosis not present

## 2019-04-09 DIAGNOSIS — Z794 Long term (current) use of insulin: Secondary | ICD-10-CM | POA: Diagnosis not present

## 2019-04-09 DIAGNOSIS — Z7982 Long term (current) use of aspirin: Secondary | ICD-10-CM | POA: Insufficient documentation

## 2019-04-09 DIAGNOSIS — C253 Malignant neoplasm of pancreatic duct: Secondary | ICD-10-CM | POA: Diagnosis present

## 2019-04-09 DIAGNOSIS — Z79899 Other long term (current) drug therapy: Secondary | ICD-10-CM | POA: Insufficient documentation

## 2019-04-09 DIAGNOSIS — Z800 Family history of malignant neoplasm of digestive organs: Secondary | ICD-10-CM | POA: Diagnosis not present

## 2019-04-09 LAB — COMPREHENSIVE METABOLIC PANEL
ALT: 19 U/L (ref 0–44)
AST: 16 U/L (ref 15–41)
Albumin: 4 g/dL (ref 3.5–5.0)
Alkaline Phosphatase: 183 U/L — ABNORMAL HIGH (ref 38–126)
Anion gap: 9 (ref 5–15)
BUN: 16 mg/dL (ref 8–23)
CO2: 25 mmol/L (ref 22–32)
Calcium: 9.1 mg/dL (ref 8.9–10.3)
Chloride: 106 mmol/L (ref 98–111)
Creatinine, Ser: 0.59 mg/dL — ABNORMAL LOW (ref 0.61–1.24)
GFR calc Af Amer: >60 mL/min (ref >60)
GFR calc non Af Amer: >60 mL/min (ref >60)
Glucose, Bld: 220 mg/dL — ABNORMAL HIGH (ref 70–99)
Potassium: 3.9 mmol/L (ref 3.5–5.1)
Sodium: 140 mmol/L (ref 135–145)
Total Bilirubin: 0.6 mg/dL (ref 0.3–1.2)
Total Protein: 6.9 g/dL (ref 6.5–8.1)

## 2019-04-09 LAB — CBC WITH DIFFERENTIAL/PLATELET
Abs Immature Granulocytes: 0.1 10*3/uL — ABNORMAL HIGH (ref 0.00–0.07)
Basophils Absolute: 0.1 10*3/uL (ref 0.0–0.1)
Basophils Relative: 1 %
Eosinophils Absolute: 0.3 10*3/uL (ref 0.0–0.5)
Eosinophils Relative: 2 %
HCT: 41.2 % (ref 39.0–52.0)
Hemoglobin: 13.5 g/dL (ref 13.0–17.0)
Immature Granulocytes: 1 %
Lymphocytes Relative: 18 %
Lymphs Abs: 2.6 10*3/uL (ref 0.7–4.0)
MCH: 31.3 pg (ref 26.0–34.0)
MCHC: 32.8 g/dL (ref 30.0–36.0)
MCV: 95.4 fL (ref 80.0–100.0)
Monocytes Absolute: 1.6 10*3/uL — ABNORMAL HIGH (ref 0.1–1.0)
Monocytes Relative: 11 %
Neutro Abs: 9.7 10*3/uL — ABNORMAL HIGH (ref 1.7–7.7)
Neutrophils Relative %: 67 %
Platelets: 315 10*3/uL (ref 150–400)
RBC: 4.32 MIL/uL (ref 4.22–5.81)
RDW: 18 % — ABNORMAL HIGH (ref 11.5–15.5)
WBC: 14.3 10*3/uL — ABNORMAL HIGH (ref 4.0–10.5)
nRBC: 0 % (ref 0.0–0.2)

## 2019-04-09 MED ORDER — SODIUM CHLORIDE 0.9 % IV SOLN
150.0000 mg/m2 | Freq: Once | INTRAVENOUS | Status: AC
  Administered 2019-04-09: 280 mg via INTRAVENOUS
  Filled 2019-04-09: qty 4

## 2019-04-09 MED ORDER — SODIUM CHLORIDE 0.9 % IV SOLN
376.0000 mg/m2 | Freq: Once | INTRAVENOUS | Status: AC
  Administered 2019-04-09: 700 mg via INTRAVENOUS
  Filled 2019-04-09: qty 35

## 2019-04-09 MED ORDER — ATROPINE SULFATE 1 MG/ML IJ SOLN
0.5000 mg | Freq: Once | INTRAMUSCULAR | Status: AC | PRN
  Administered 2019-04-09: 0.5 mg via INTRAVENOUS
  Filled 2019-04-09: qty 1

## 2019-04-09 MED ORDER — SODIUM CHLORIDE 0.9 % IV SOLN
2400.0000 mg/m2 | INTRAVENOUS | Status: DC
  Administered 2019-04-09: 4450 mg via INTRAVENOUS
  Filled 2019-04-09: qty 89

## 2019-04-09 MED ORDER — OXALIPLATIN CHEMO INJECTION 100 MG/20ML
63.7500 mg/m2 | Freq: Once | INTRAVENOUS | Status: AC
  Administered 2019-04-09: 120 mg via INTRAVENOUS
  Filled 2019-04-09: qty 20

## 2019-04-09 MED ORDER — DEXTROSE 5 % IV SOLN
Freq: Once | INTRAVENOUS | Status: AC
  Administered 2019-04-09: 09:00:00 via INTRAVENOUS

## 2019-04-09 MED ORDER — PALONOSETRON HCL INJECTION 0.25 MG/5ML
0.2500 mg | Freq: Once | INTRAVENOUS | Status: AC
  Administered 2019-04-09: 0.25 mg via INTRAVENOUS
  Filled 2019-04-09: qty 5

## 2019-04-09 MED ORDER — SODIUM CHLORIDE 0.9 % IV SOLN
Freq: Once | INTRAVENOUS | Status: AC
  Administered 2019-04-09: 10:00:00 via INTRAVENOUS
  Filled 2019-04-09: qty 5

## 2019-04-09 MED ORDER — HEPARIN SOD (PORK) LOCK FLUSH 100 UNIT/ML IV SOLN: 500.0000 [IU] | Freq: Once | INTRAVENOUS | Status: DC | PRN

## 2019-04-09 MED ORDER — SODIUM CHLORIDE 0.9% FLUSH
10.0000 mL | INTRAVENOUS | Status: DC | PRN
  Administered 2019-04-09: 10 mL
  Filled 2019-04-09: qty 10

## 2019-04-09 NOTE — Progress Notes (Signed)
To treatment room for oncology follow up and treatment.  Patient seen by Dr. Delton Coombes with lab review and ok to treat verbal order.    Patient tolerated chemotherapy with no complaints voiced.  Port site clean and dry with no bruising or swelling noted at site.  Good blood return noted before and after administration of chemotherapy.  Chemo pump connected with no alarms noted.  Dressing intact.  Patient left ambulatory with VSS and no s/s of distress noted.

## 2019-04-09 NOTE — Assessment & Plan Note (Signed)
1.  Stage IIb (T2N1) pancreatic adenocarcinoma: - Status post distal pancreatectomy and splenectomy at [facility] by Dr. Zenia Resides on 11/20/2018. 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 - 7 cycles of FOLFIRINOX from 12/30/2018 through 03/25/2019. - He has tolerated last cycle reasonably well.  He did have mouth sores which lasted few days. -We reviewed his blood work.  He may proceed with cycle 8 today.  No dose modifications. -We will see him back in 2 weeks for follow-up prior to next cycle.  2.  Post splenectomy state: -He received vaccinations prior to splenectomy. -We will follow-up on the post to schedule.  3.  Diabetes: -Latest hemoglobin A1c was 10.8.  He is continuing Basaglar 38 units daily.  4.  Neuropathy: -She has questionable signs of neuropathy from his diabetes of long duration.  Denies any tingling or numbness in the extremities. - I have cut back on his oxaliplatin dose by 25% from cycle 1.

## 2019-04-09 NOTE — Patient Instructions (Addendum)
[facility] at [facility] Discharge Instructions  You were seen today by Dr. Katragadda. He went over your recent lab results. He will see you back in 2 weeks for labs and follow up.   Thank you for choosing [facility] at [facility] to provide your oncology and hematology care.  To afford each patient quality time with our provider, please arrive at least 15 minutes before your scheduled appointment time.   If you have a lab appointment with the Cancer Center please come in thru the  Main Entrance and check in at the main information desk  You need to re-schedule your appointment should you arrive 10 or more minutes late.  We strive to give you quality time with our providers, and arriving late affects you and other patients whose appointments are after yours.  Also, if you no show three or more times for appointments you may be dismissed from the clinic at the providers discretion.     Again, thank you for choosing [facility].  Our hope is that these requests will decrease the amount of time that you wait before being seen by our physicians.       _____________________________________________________________  Should you have questions after your visit to [facility], please contact our office at [phone] between the hours of 8:00 a.m. and 4:30 p.m.  Voicemails left after 4:00 p.m. will not be returned until the following business day.  For prescription refill requests, have your pharmacy contact our office and allow 72 hours.    Cancer Center Support Programs:   > Cancer Support Group  2nd Tuesday of the month 1pm-2pm, Journey Room    

## 2019-04-09 NOTE — Progress Notes (Signed)
[address], [place] [address]   CLINIC:  Medical Oncology/Hematology  PCP:  Doree Albee, MD [place] [address] [phone]   REASON FOR VISIT:  Follow-up for pancreatic cancer      BRIEF ONCOLOGIC HISTORY:  Oncology History  Pancreatic adenocarcinoma ([place])  08/20/2018 Imaging   CT abdomen/pelvis w/ contrast: IMPRESSION: Atrophy and ductal dilatation involving the pancreatic tail, with suspected small soft tissue mass in the pancreatic body which could represent pancreatic carcinoma. Abdomen MRI and MRCP without and with contrast is recommended for further evaluation.  No evidence of hepatobiliary disease.   08/30/2018 Imaging   MRI abdomen w/ contrast: IMPRESSION: 1. Although not definitive, there remains concern of a small hypoenhancing mass at the junction of the pancreatic body and tail associated with atrophy and ductal dilatation in the pancreatic tail. This remains concerning for pancreatic neoplasm. Postinflammatory stricture less likely. Besides a tiny cystic lesion in the pancreatic tail, there are no other signs of previous pancreatitis. Further evaluation with endoscopic ultrasound for possible biopsy strongly recommended. 2. No evidence of metastatic disease. 3. Cholelithiasis without evidence of cholecystitis or biliary dilatation.   09/03/2018 Initial Diagnosis   Pancreatic adenocarcinoma ([place])   10/03/2018 Procedure   EUS: - 2.6cm irregularly shaped mass in the body of the pancreas that causing main pancreatic duct obstruction and dilation. The mass abuts the splenic vessels but no other significant vascular structures and it was sampled with trangastric EUS FNA. Preliminary cytology is + for malignancy, likely well-differentiated adenocarcinoma. It appears surgically resectable.   10/03/2018 Pathology Results   Accession: [id number]  FINE NEEDLE ASPIRATION, ENDOSCOPIC, PANCREAS BODY  (SPECIMEN 1 OF 1 COLLECTED 10/03/18): ADENOCARCINOMA.   10/17/2018 Imaging   PET: IMPRESSION: 1. Tiny focus of hypermetabolism identified in the body of pancreas, adjacent to the abrupt cut off of the main pancreatic duct. No evidence for hypermetabolic metastatic disease in the neck, chest, abdomen, or pelvis. 2. Cholelithiasis. 3.  Aortic Atherosclerois (ICD10-170.0) 4. 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  The Hereditary Gene Panel offered by [facility] includes sequencing and/or deletion duplication testing of the following 47 genes: APC, ATM, AXIN2, BARD1, BMPR1A, BRCA1, BRCA2, BRIP1, CDH1, CDK4, CDKN2A (p14ARF), CDKN2A (p16INK4a), CHEK2, CTNNA1, DICER1, EPCAM (Deletion/duplication testing only), GREM1 (promoter region deletion/duplication testing only), KIT, MEN1, MLH1, MSH2, MSH3, MSH6, MUTYH, NBN, NF1, NHTL1, PALB2, PDGFRA, PMS2, POLD1, POLE, PTEN, RAD50, RAD51C, RAD51D, SDHB, SDHC, SDHD, SMAD4, SMARCA4. STK11, TP53, TSC1, TSC2, and VHL.  The following genes were evaluated for sequence changes only: SDHA and HOXB13 c.251G>A variant only. The report date is November 12, 2018.   11/20/2018 Surgery   Distal subtotal pancreatectomy with splenectomy at [facility]   11/20/2018 Surgery   TUMOR   Tumor Site:  Pancreatic body    Histologic Type:  Ductal adenocarcinoma    Histologic Grade:  G1: Well differentiated    Tumor Size:  Greatest dimension in Centimeters (cm): 2.8 Centimeters (cm)    Additional Dimension in Centimeters (cm):  2.7 Centimeters (cm)    Additional Dimension in Centimeters (cm):  1.8 Centimeters (cm)   Tumor Extent:      Tumor Extension:  Tumor is confined to pancreas    Accessory Findings:      Treatment Effect:  No known presurgical therapy     Lymphovascular Invasion:  Not identified     Perineural Invasion:  Not identified  MARGINS   Margins:      Proximal  Pancreatic Parenchymal Margin:  Uninvolved by invasive carcinoma and pancreatic high-grade intraepithelial neoplasia      Distance of Invasive Carcinoma from Margin:  0.6 Centimeters (cm)   :      Other Margin:  Splenic margin.      Margin Status:  Uninvolved by invasive carcinoma   LYMPH NODES  Number of Lymph Nodes Involved:  2   Number of Lymph Nodes Examined:  16   PATHOLOGIC STAGE CLASSIFICATION (pTNM, AJCC 8th Edition)  TNM Descriptors:  Not applicable   Primary Tumor (pT):  pT2   Regional Lymph Nodes (pN):  pN1   ADDITIONAL FINDINGS  Additional Pathologic Findings:  Pancreatic intraepithelial neoplasia    Highest Grade (PanIN):  High grade PanIN is present.   Additional Pathologic Findings:  Benign unilocular pancreatic cyst (1.3 cm in greatest dimension) at the pancreatic tail.   12/30/2018 -  Chemotherapy   The patient had palonosetron (ALOXI) injection 0.25 mg, 0.25 mg, Intravenous,  Once, 8 of 12 cycles Administration: 0.25 mg (12/30/2018), 0.25 mg (01/13/2019), 0.25 mg (01/28/2019), 0.25 mg (02/11/2019), 0.25 mg (02/25/2019), 0.25 mg (03/11/2019), 0.25 mg (03/25/2019), 0.25 mg (04/09/2019) pegfilgrastim (NEULASTA) injection 6 mg, 6 mg, Subcutaneous, Once, 7 of 7 cycles Administration: 6 mg (01/01/2019), 6 mg (01/15/2019), 6 mg (01/30/2019), 6 mg (02/13/2019), 6 mg (02/27/2019), 6 mg (03/13/2019), 6 mg (03/27/2019) pegfilgrastim-jmdb (FULPHILA) injection 6 mg, 6 mg, Subcutaneous,  Once, 1 of 5 cycles irinotecan (CAMPTOSAR) 280 mg in sodium chloride 0.9 % 500 mL chemo infusion, 150 mg/m2 = 280 mg (100 % of original dose 150 mg/m2), Intravenous,  Once, 8 of 12 cycles Dose modification: 150 mg/m2 (original dose 150 mg/m2, Cycle 1, Reason: Provider Judgment) Administration: 280 mg (12/30/2018), 280 mg (01/13/2019), 280 mg (01/28/2019), 280 mg (02/11/2019), 280 mg (02/25/2019), 280 mg (03/11/2019), 280 mg (03/25/2019), 280 mg (04/09/2019) leucovorin 700 mg in sodium chloride  0.9 % 250 mL infusion, 744 mg, Intravenous,  Once, 8 of 12 cycles Administration: 700 mg (12/30/2018), 700 mg (01/13/2019), 700 mg (01/28/2019), 700 mg (02/11/2019), 700 mg (02/25/2019), 700 mg (03/11/2019), 700 mg (03/25/2019), 700 mg (04/09/2019) oxaliplatin (ELOXATIN) 120 mg in dextrose 5 % 500 mL chemo infusion, 63.75 mg/m2 = 120 mg (75 % of original dose 85 mg/m2), Intravenous,  Once, 8 of 12 cycles Dose modification: 63.75 mg/m2 (75 % of original dose 85 mg/m2, Cycle 1, Reason: Other (see comments), Comment: high risk for worsening neuropathy) Administration: 120 mg (12/30/2018), 120 mg (01/13/2019), 120 mg (01/28/2019), 120 mg (02/11/2019), 120 mg (02/25/2019), 120 mg (03/11/2019), 120 mg (03/25/2019), 120 mg (04/09/2019) fosaprepitant (EMEND) 150 mg, dexamethasone (DECADRON) 12 mg in sodium chloride 0.9 % 145 mL IVPB, , Intravenous,  Once, 8 of 12 cycles Administration:  (12/30/2018),  (01/13/2019),  (01/28/2019),  (02/11/2019),  (02/25/2019),  (03/11/2019),  (03/25/2019),  (04/09/2019) fluorouracil (ADRUCIL) 4,450 mg in sodium chloride 0.9 % 61 mL chemo infusion, 2,400 mg/m2 = 4,450 mg, Intravenous, 1 Day/Dose, 8 of 12 cycles Administration: 4,450 mg (12/30/2018), 4,450 mg (01/13/2019), 4,450 mg (01/28/2019), 4,450 mg (02/11/2019), 4,450 mg (02/25/2019), 4,450 mg (03/11/2019), 4,450 mg (03/25/2019), 4,450 mg (04/09/2019)  for chemotherapy treatment.       CANCER STAGING: Cancer Staging No matching staging information was found for the patient.   INTERVAL HISTORY:  Mr. Degregorio 63 y.o. male seen for cycle 8 of chemotherapy.  Cycle 7 was on 03/25/2019.  He had mucositis lasting few days after last cycle.  Denied  any diarrhea.  He did have some mild constipation.  No nausea or vomiting was reported.  Appetite was 75%.  Energy levels are 50%.  No abdominal pains reported.  Sleeping problems at times was reported.  Denies any fevers or night sweats.  He is able to eat well and maintaining his weight.  No ER visits or  hospitalizations.  REVIEW OF SYSTEMS:  Review of Systems  HENT:   Positive for mouth sores.   Gastrointestinal: Positive for constipation.  Psychiatric/Behavioral: Positive for sleep disturbance.  All other systems reviewed and are negative.    PAST MEDICAL/SURGICAL HISTORY:  Past Medical History:  Diagnosis Date   Cervical compression fracture (HCC)    Cervical spinal stenosis    Diabetic neuropathy (HCC)    Bilateral legs   Diverticulitis    Family history of pancreatic cancer    History of kidney stones    Hypothyroidism    Pancreatic cancer ([place])    Stenosis of left vertebral artery    Stroke [facility])    2011   Type 2 diabetes mellitus ([place])    Past Surgical History:  Procedure Laterality Date   APPENDECTOMY     COLON RESECTION     For diverticulitis   COLON SURGERY     ESOPHAGOGASTRODUODENOSCOPY N/A 10/03/2018   Procedure: ESOPHAGOGASTRODUODENOSCOPY (EGD);  Surgeon: [name], MD;  Location: [name];  Service: Endoscopy;  Laterality: N/A;   EUS N/A 10/03/2018   Procedure: UPPER ENDOSCOPIC ULTRASOUND (EUS) RADIAL;  Surgeon: [name], MD;  Location: [facility];  Service: Endoscopy;  Laterality: N/A;   FINE NEEDLE ASPIRATION N/A 10/03/2018   Procedure: FINE NEEDLE ASPIRATION (FNA) LINEAR;  Surgeon: [name], MD;  Location: [facility];  Service: Endoscopy;  Laterality: N/A;   IR ANGIO INTRA EXTRACRAN SEL COM CAROTID INNOMINATE BILAT MOD SED  01/21/2018   IR ANGIO INTRA EXTRACRAN SEL COM CAROTID INNOMINATE UNI R MOD SED  03/21/2018   IR ANGIO VERTEBRAL SEL VERTEBRAL BILAT MOD SED  01/21/2018   IR TRANSCATH EXCRAN VERT OR CAR A STENT  03/21/2018   PORTACATH PLACEMENT Left 12/23/2018   Procedure: INSERTION PORT-A-CATH;  Surgeon: Virl Cagey, MD;  Location: [facility];  Service: General;  Laterality: Left;   RADIOLOGY WITH ANESTHESIA N/A 03/21/2018   Procedure: IR WITH ANESTHESIA WITH STENT PLACEMENT;  Surgeon: Luanne Bras, MD;  Location: [facility];  Service: Radiology;  Laterality: N/A;   ROTATOR CUFF REPAIR     Left     SOCIAL HISTORY:  Social History   Socioeconomic History   Marital status: Single    Spouse name: Not on file   Number of children: 2   Years of education: Not on file   Highest education level: Not on file  Occupational History   Occupation: Estate agent  Social Needs   Financial resource strain: Somewhat hard   Food insecurity    Worry: Never true    Inability: Never true   Transportation needs    Medical: No    Non-medical: No  Tobacco Use   Smoking status: Never Smoker   Smokeless tobacco: Never Used  Substance and Sexual Activity   Alcohol use: Not Currently   Drug use: Never   Sexual activity: Not on file  Lifestyle   Physical activity    Days per week: 0 days    Minutes per session: 0 min   Stress: To some extent  Relationships   Social connections    Talks on  phone: More than three times a week    Gets together: Once a week    Attends religious service: More than 4 times per year    Active member of club or organization: No    Attends meetings of clubs or organizations: Never    Relationship status: Divorced   Intimate partner violence    Fear of current or ex partner: No    Emotionally abused: No    Physically abused: No    Forced sexual activity: No  Other Topics Concern   Not on file  Social History Narrative   Not on file    FAMILY HISTORY:  Family History  Problem Relation Age of Onset   Stroke Mother    Pancreatic cancer Father 67       d. 37   Stroke Maternal Grandmother    Heart attack Maternal Grandfather    Heart Problems Paternal Grandfather    Scoliosis Daughter    Muscular dystrophy Grandson     CURRENT MEDICATIONS:  Outpatient Encounter Medications as of 04/09/2019  Medication Sig   aspirin 81 MG tablet Take 1 tablet (81 mg total) by mouth daily.   atorvastatin (LIPITOR) 80 MG tablet Take  80 mg by mouth daily at 6 PM.    dextrose 5 % SOLN 1,000 mL with fluorouracil 5 GM/100ML SOLN Inject into the vein over 48 hr.   ECHINACEA EXTRACT PO Take 2 tablets by mouth daily.    FLUOROURACIL IV Inject into the vein every 14 (fourteen) days.   gabapentin (NEURONTIN) 300 MG capsule Take 1 capsule (300 mg total) by mouth 2 (two) times daily.   HYDROcodone-acetaminophen (NORCO/VICODIN) 5-325 MG tablet Take 1 tablet by mouth every 4 (four) hours as needed.   Insulin Glargine (BASAGLAR KWIKPEN) 100 UNIT/ML SOPN Inject 0.38 mLs (38 Units total) into the skin at bedtime.   Insulin Pen Needle 32G X 4 MM MISC 1 Device by Does not apply route daily.   IRINOTECAN HCL IV Inject into the vein every 14 (fourteen) days.   LEUCOVORIN CALCIUM IV Inject into the vein every 14 (fourteen) days.   lidocaine-prilocaine (EMLA) cream Apply pea-sized amount to port-a-cath site and cover with plastic wrap one hour prior to chemotherapy appointments   loperamide (IMODIUM A-D) 2 MG tablet Take 1 tablet (2 mg total) by mouth as needed. Take 2 at diarrhea onset , then 1 every 2hr until 12hrs with no BM. May take 2 every 4hrs at night. If diarrhea recurs repeat.   NP THYROID 30 MG tablet Take 30 mg by mouth daily before breakfast.    Omega-3 1000 MG CAPS Take 1,000 mg by mouth daily.   OXALIPLATIN IV Inject into the vein every 14 (fourteen) days.   pantoprazole (PROTONIX) 20 MG tablet Take 20 mg by mouth daily.    prochlorperazine (COMPAZINE) 10 MG tablet Take 1 tablet (10 mg total) by mouth every 6 (six) hours as needed (NAUSEA).   sertraline (ZOLOFT) 50 MG tablet Take 50 mg by mouth every morning.    tamsulosin (FLOMAX) 0.4 MG CAPS capsule Take 0.4 mg by mouth daily.    ticagrelor (BRILINTA) 90 MG TABS tablet Take 1 tablet (90 mg total) by mouth 2 (two) times daily.   traMADol (ULTRAM) 50 MG tablet    vitamin B-12 (CYANOCOBALAMIN) 1000 MCG tablet Take 1,000 mcg by mouth daily.    Facility-Administered Encounter Medications as of 04/09/2019  Medication   0.9 %  sodium chloride infusion   0.9 %  sodium chloride infusion   dextrose 5 % solution   sodium chloride flush (NS) 0.9 % injection 10 mL    ALLERGIES:  Allergies  Allergen Reactions   Demerol [Meperidine Hcl] Other (See Comments)    convulsions     PHYSICAL EXAM:  ECOG Performance status: 1  Vitals:   04/09/19 0830  BP: (!) 123/59  Pulse: 83  Resp: 18  Temp: 97.7 F (36.5 C)  SpO2: 96%   Filed Weights   04/09/19 0830  Weight: 151 lb 6.4 oz (68.7 kg)    Physical Exam Vitals signs reviewed.  Constitutional:      Appearance: Normal appearance.  Cardiovascular:     Rate and Rhythm: Normal rate and regular rhythm.     Heart sounds: Normal heart sounds.  Pulmonary:     Effort: Pulmonary effort is normal.     Breath sounds: Normal breath sounds.  Abdominal:     General: There is no distension.     Palpations: Abdomen is soft. There is no mass.  Musculoskeletal:        General: No swelling.  Skin:    General: Skin is warm.  Neurological:     General: No focal deficit present.     Mental Status: He is alert and oriented to person, place, and time.  Psychiatric:        Mood and Affect: Mood normal.        Behavior: Behavior normal.    Left ankle in a boot.  LABORATORY DATA:  I have reviewed the labs as listed.  CBC    Component Value Date/Time   WBC 14.3 (H) 04/09/2019 0824   RBC 4.32 04/09/2019 0824   HGB 13.5 04/09/2019 0824   HGB 14.9 12/11/2018 1018   HCT 41.2 04/09/2019 0824   PLT 315 04/09/2019 0824   PLT 335 12/11/2018 1018   MCV 95.4 04/09/2019 0824   MCH 31.3 04/09/2019 0824   MCHC 32.8 04/09/2019 0824   RDW 18.0 (H) 04/09/2019 0824   LYMPHSABS 2.6 04/09/2019 0824   MONOABS 1.6 (H) 04/09/2019 0824   EOSABS 0.3 04/09/2019 0824   BASOSABS 0.1 04/09/2019 0824   CMP Latest Ref Rng & Units 04/09/2019 03/25/2019 03/11/2019  Glucose 70 - 99 mg/dL 220(H) 210(H)  172(H)  BUN 8 - 23 mg/dL '16 18 13  '$ Creatinine 0.61 - 1.24 mg/dL 0.59(L) 0.55(L) 0.59(L)  Sodium 135 - 145 mmol/L 140 140 139  Potassium 3.5 - 5.1 mmol/L 3.9 3.9 3.2(L)  Chloride 98 - 111 mmol/L 106 107 102  CO2 22 - 32 mmol/L '25 25 25  '$ Calcium 8.9 - 10.3 mg/dL 9.1 9.0 9.0  Total Protein 6.5 - 8.1 g/dL 6.9 6.8 6.8  Total Bilirubin 0.3 - 1.2 mg/dL 0.6 0.7 0.8  Alkaline Phos 38 - 126 U/L 183(H) 190(H) 168(H)  AST 15 - 41 U/L '16 17 17  '$ ALT 0 - 44 U/L '19 21 23       '$ DIAGNOSTIC IMAGING:  I have independently reviewed the scans and discussed with the patient.      ASSESSMENT & PLAN:   Pancreatic adenocarcinoma (HCC) 1.  Stage IIb (T2N1) pancreatic adenocarcinoma: - Status post distal pancreatectomy and splenectomy at [facility] by Dr. Zenia Resides on 11/20/2018. 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 - 7 cycles of FOLFIRINOX from 12/30/2018 through 03/25/2019. - He has tolerated last cycle reasonably well.  He did have mouth sores which lasted few days. -We reviewed his blood work.  He may proceed with cycle 8  today.  No dose modifications. -We will see him back in 2 weeks for follow-up prior to next cycle.  2.  Post splenectomy state: -He received vaccinations prior to splenectomy. -We will follow-up on the post to schedule.  3.  Diabetes: -Latest hemoglobin A1c was 10.8.  He is continuing Basaglar 38 units daily.  4.  Neuropathy: -She has questionable signs of neuropathy from his diabetes of long duration.  Denies any tingling or numbness in the extremities. - I have cut back on his oxaliplatin dose by 25% from cycle 1.   Total time spent is 25 minutes with more than 50% of the time spent face-to-face discussing and reinforcing treatment plan and coordination of care.  Orders placed this encounter:  Orders Placed This Encounter  Procedures   CBC with Differential/Platelet   Comprehensive metabolic panel      Derek Jack, MD [place] [phone]

## 2019-04-10 ENCOUNTER — Ambulatory Visit (INDEPENDENT_AMBULATORY_CARE_PROVIDER_SITE_OTHER): Payer: PRIVATE HEALTH INSURANCE | Admitting: "Endocrinology

## 2019-04-10 ENCOUNTER — Encounter (HOSPITAL_COMMUNITY): Payer: PRIVATE HEALTH INSURANCE

## 2019-04-10 ENCOUNTER — Encounter: Payer: Self-pay | Admitting: "Endocrinology

## 2019-04-10 DIAGNOSIS — E782 Mixed hyperlipidemia: Secondary | ICD-10-CM | POA: Diagnosis not present

## 2019-04-10 DIAGNOSIS — I10 Essential (primary) hypertension: Secondary | ICD-10-CM

## 2019-04-10 DIAGNOSIS — E1165 Type 2 diabetes mellitus with hyperglycemia: Secondary | ICD-10-CM | POA: Diagnosis not present

## 2019-04-10 MED ORDER — BASAGLAR KWIKPEN 100 UNIT/ML ~~LOC~~ SOPN: 42.0000 [IU] | PEN_INJECTOR | Freq: Every day | SUBCUTANEOUS | 2 refills | Status: DC

## 2019-04-10 NOTE — Progress Notes (Signed)
04/10/2019, 4:40 PM                                 Endocrinology Telehealth Visit Follow up Note -During COVID -19 Pandemic  I connected with Mark Foster on 04/10/2019   by telephone and verified that I am speaking with the correct person using two identifiers. Mark Foster, 05-Feb-1956. he has verbally consented to this visit. All issues noted in this document were discussed and addressed. The format was not optimal for physical exam.   Subjective:    Patient ID: Mark Foster, male    DOB: 12-16-1955.  Mark Foster is being engaged in telehealth in follow-up for management of currently uncontrolled symptomatic pancreatic diabetes, hypothyroidism, hyperlipidemia. PMD:  Doree Albee, MD.   Past Medical History:  Diagnosis Date  . Cervical compression fracture ([place])   . Cervical spinal stenosis   . Diabetic neuropathy (HCC)    Bilateral legs  . Diverticulitis   . Family history of pancreatic cancer   . History of kidney stones   . Hypothyroidism   . Pancreatic cancer ([place])   . Stenosis of left vertebral artery   . Stroke [facility])    2011  . Type 2 diabetes mellitus ([place])    Past Surgical History:  Procedure Laterality Date  . APPENDECTOMY    . COLON RESECTION     For diverticulitis  . COLON SURGERY    . ESOPHAGOGASTRODUODENOSCOPY N/A 10/03/2018   Procedure: ESOPHAGOGASTRODUODENOSCOPY (EGD);  Surgeon: [name], MD;  Location: [name];  Service: Endoscopy;  Laterality: N/A;  . EUS N/A 10/03/2018   Procedure: UPPER ENDOSCOPIC ULTRASOUND (EUS) RADIAL;  Surgeon: [name], MD;  Location: [facility];  Service: Endoscopy;  Laterality: N/A;  . FINE NEEDLE ASPIRATION N/A 10/03/2018   Procedure: FINE NEEDLE ASPIRATION (FNA) LINEAR;  Surgeon: [name], MD;  Location: [facility];  Service: Endoscopy;  Laterality: N/A;  . IR ANGIO INTRA EXTRACRAN SEL COM CAROTID INNOMINATE BILAT  MOD SED  01/21/2018  . IR ANGIO INTRA EXTRACRAN SEL COM CAROTID INNOMINATE UNI R MOD SED  03/21/2018  . IR ANGIO VERTEBRAL SEL VERTEBRAL BILAT MOD SED  01/21/2018  . IR TRANSCATH EXCRAN VERT OR CAR A STENT  03/21/2018  . PORTACATH PLACEMENT Left 12/23/2018   Procedure: INSERTION PORT-A-CATH;  Surgeon: Virl Cagey, MD;  Location: [facility];  Service: General;  Laterality: Left;  . RADIOLOGY WITH ANESTHESIA N/A 03/21/2018   Procedure: IR WITH ANESTHESIA WITH STENT PLACEMENT;  Surgeon: Luanne Bras, MD;  Location: [facility];  Service: Radiology;  Laterality: N/A;  . ROTATOR CUFF REPAIR     Left   Social History   Socioeconomic History  . Marital status: Single    Spouse name: Not on file  . Number of children: 2  . Years of education: Not on file  . Highest education level: Not on file  Occupational History  . Occupation: Estate agent  Social Needs  . Financial resource strain: Somewhat hard  . Food insecurity    Worry: Never true  Inability: Never true  . Transportation needs    Medical: No    Non-medical: No  Tobacco Use  . Smoking status: Never Smoker  . Smokeless tobacco: Never Used  Substance and Sexual Activity  . Alcohol use: Not Currently  . Drug use: Never  . Sexual activity: Not on file  Lifestyle  . Physical activity    Days per week: 0 days    Minutes per session: 0 min  . Stress: To some extent  Relationships  . Social connections    Talks on phone: More than three times a week    Gets together: Once a week    Attends religious service: More than 4 times per year    Active member of club or organization: No    Attends meetings of clubs or organizations: Never    Relationship status: Divorced  Other Topics Concern  . Not on file  Social History Narrative  . Not on file   Outpatient Encounter Medications as of 04/10/2019  Medication Sig  . aspirin 81 MG tablet Take 1 tablet (81 mg total) by mouth daily.  Marland Kitchen atorvastatin (LIPITOR) 80 MG tablet Take  80 mg by mouth daily at 6 PM.   . dextrose 5 % SOLN 1,000 mL with fluorouracil 5 GM/100ML SOLN Inject into the vein over 48 hr.  . ECHINACEA EXTRACT PO Take 2 tablets by mouth daily.   Marland Kitchen FLUOROURACIL IV Inject into the vein every 14 (fourteen) days.  Marland Kitchen gabapentin (NEURONTIN) 300 MG capsule Take 1 capsule (300 mg total) by mouth 2 (two) times daily.  Marland Kitchen HYDROcodone-acetaminophen (NORCO/VICODIN) 5-325 MG tablet Take 1 tablet by mouth every 4 (four) hours as needed.  . Insulin Glargine (BASAGLAR KWIKPEN) 100 UNIT/ML SOPN Inject 0.42 mLs (42 Units total) into the skin at bedtime.  . Insulin Pen Needle 32G X 4 MM MISC 1 Device by Does not apply route daily.  . IRINOTECAN HCL IV Inject into the vein every 14 (fourteen) days.  Marland Kitchen LEUCOVORIN CALCIUM IV Inject into the vein every 14 (fourteen) days.  Marland Kitchen lidocaine-prilocaine (EMLA) cream Apply pea-sized amount to port-a-cath site and cover with plastic wrap one hour prior to chemotherapy appointments  . loperamide (IMODIUM A-D) 2 MG tablet Take 1 tablet (2 mg total) by mouth as needed. Take 2 at diarrhea onset , then 1 every 2hr until 12hrs with no BM. May take 2 every 4hrs at night. If diarrhea recurs repeat.  . NP THYROID 30 MG tablet Take 30 mg by mouth daily before breakfast.   . Omega-3 1000 MG CAPS Take 1,000 mg by mouth daily.  . OXALIPLATIN IV Inject into the vein every 14 (fourteen) days.  . pantoprazole (PROTONIX) 20 MG tablet Take 20 mg by mouth daily.   . prochlorperazine (COMPAZINE) 10 MG tablet Take 1 tablet (10 mg total) by mouth every 6 (six) hours as needed (NAUSEA).  Marland Kitchen sertraline (ZOLOFT) 50 MG tablet Take 50 mg by mouth every morning.   . tamsulosin (FLOMAX) 0.4 MG CAPS capsule Take 0.4 mg by mouth daily.   . ticagrelor (BRILINTA) 90 MG TABS tablet Take 1 tablet (90 mg total) by mouth 2 (two) times daily.  . traMADol (ULTRAM) 50 MG tablet   . vitamin B-12 (CYANOCOBALAMIN) 1000 MCG tablet Take 1,000 mcg by mouth daily.  . [DISCONTINUED]  Insulin Glargine (BASAGLAR KWIKPEN) 100 UNIT/ML SOPN Inject 0.38 mLs (38 Units total) into the skin at bedtime.   Facility-Administered Encounter Medications as of 04/10/2019  Medication  .  0.9 %  sodium chloride infusion  . 0.9 %  sodium chloride infusion  . dextrose 5 % solution  . sodium chloride flush (NS) 0.9 % injection 10 mL    ALLERGIES: Allergies  Allergen Reactions  . Demerol [Meperidine Hcl] Other (See Comments)    convulsions    VACCINATION STATUS: Immunization History  Administered Date(s) Administered  . HiB (PRP-T) 10/23/2018  . Influenza-Unspecified 08/02/2018  . Meningococcal Mcv4o 10/23/2018  . Pneumococcal Conjugate-13 10/23/2018    Diabetes He presents for his follow-up diabetic visit. He has type 1 diabetes mellitus. Onset time: He was diagnosed at approximate age of 62 years. His disease course has been improving (He did have A1c of 9.7% in May 2019.  However, more recently he has documented controlled glycemic profile averaging 134 over the last 2 weeks.). There are no hypoglycemic associated symptoms. Pertinent negatives for hypoglycemia include no confusion, headaches, pallor or seizures. There are no diabetic associated symptoms. Pertinent negatives for diabetes include no chest pain, no fatigue, no polydipsia, no polyphagia, no polyuria and no weakness. There are no hypoglycemic complications. Symptoms are improving. Diabetic complications include a CVA. Pertinent negatives for diabetic complications include no heart disease. (Awaiting stent placement in his carotid artery.) Risk factors for coronary artery disease include diabetes mellitus, dyslipidemia, hypertension, male sex and sedentary lifestyle. Current diabetic treatment includes insulin injections. He is following a generally unhealthy diet. When asked about meal planning, he reported none. He has not had a previous visit with a dietitian. He rarely participates in exercise. His home blood glucose trend is  increasing steadily. His breakfast blood glucose range is generally 180-200 mg/dl. His lunch blood glucose range is generally 180-200 mg/dl. His dinner blood glucose range is generally 180-200 mg/dl. His bedtime blood glucose range is generally 180-200 mg/dl. His overall blood glucose range is 180-200 mg/dl. An ACE inhibitor/angiotensin II receptor blocker is not being taken.  Hyperlipidemia This is a chronic problem. The current episode started more than 1 year ago. The problem is uncontrolled. Exacerbating diseases include diabetes. Pertinent negatives include no chest pain, myalgias or shortness of breath. Current antihyperlipidemic treatment includes statins. Risk factors for coronary artery disease include diabetes mellitus, dyslipidemia, hypertension, male sex and a sedentary lifestyle.   Review of systems: Limited as above   Objective:    There were no vitals taken for this visit.  Wt Readings from Last 3 Encounters:  04/09/19 151 lb 6.4 oz (68.7 kg)  03/26/19 151 lb (68.5 kg)  03/25/19 151 lb 3.2 oz (68.6 kg)      CMP ( most recent) CMP     Component Value Date/Time   NA 140 04/09/2019 0824   K 3.9 04/09/2019 0824   CL 106 04/09/2019 0824   CO2 25 04/09/2019 0824   GLUCOSE 220 (H) 04/09/2019 0824   BUN 16 04/09/2019 0824   CREATININE 0.59 (L) 04/09/2019 0824   CREATININE 0.88 12/11/2018 1018   CALCIUM 9.1 04/09/2019 0824   PROT 6.9 04/09/2019 0824   ALBUMIN 4.0 04/09/2019 0824   AST 16 04/09/2019 0824   AST 16 12/11/2018 1018   ALT 19 04/09/2019 0824   ALT 19 12/11/2018 1018   ALKPHOS 183 (H) 04/09/2019 0824   BILITOT 0.6 04/09/2019 0824   BILITOT 0.8 12/11/2018 1018   GFRNONAA >60 04/09/2019 0824   GFRNONAA >60 12/11/2018 1018   GFRAA >60 04/09/2019 0824   GFRAA >60 12/11/2018 1018     Diabetic Labs (most recent): Lab Results  Component Value  Date   HGBA1C 10.8 (H) 02/25/2019   HGBA1C 9.0 (H) 11/12/2018   HGBA1C 6.2 (H) 05/20/2018     Lipid Panel ( most  recent) Lipid Panel     Component Value Date/Time   CHOL 168 01/20/2018 0247   TRIG 51 01/20/2018 0247   HDL 45 01/20/2018 0247   CHOLHDL 3.7 01/20/2018 0247   VLDL 10 01/20/2018 0247   LDLCALC 113 (H) 01/20/2018 0247    Assessment & Plan:   1. Uncontrolled type 2 diabetes mellitus with hyperglycemia (HCC)  - Jibri Schriefer has history of uncontrolled diabetes with recent A1c of 9.%.   -more recently patient is diagnosed with pancreatic cancer currently in the middle of chemotherapy. He is on Basaglar 24 units nightly .  He reports that his fasting blood glucose ranges from 180- 200, no hypoglycemia.    -  Recent labs reviewed.  -his diabetes is likely pancreatic diabetes given his history of heavy alcohol use in the past. -He is advised to introduce more carbohydrates to his diet.  He would benefit from some weight gain. :   -He is not a suitable candidate for oral antidiabetic medications nor incretin therapy, hence he is advised to discontinue glipizide. -Given his pancreatic diabetes, even before he was diagnosed with pancreatic cancer, the best choice of therapy for his diabetes will be exclusively with insulin. -He is advised to increase Basaglar to 42 units nightly, continue to monitor blood glucose 4 times a day-daily before meals and at bedtime.    -Patient is encouraged to call clinic for blood glucose levels less than 70 or above 300 mg /dl.  - Patient specific target  A1c;  LDL, HDL, Triglycerides, and  Waist Circumference were discussed in detail. -He is anti-GAD antibodies, anti-islet cell antibodies are negative indicating his diabetes is not likely to be type I, likely related to his prior alcohol abuse causing pancreatic diabetes.  -The priority in this patient would be to avoid hypoglycemia due to lack of alpha cell  glucagon response on the background of alcohol induced diabetes.  2) Lipids/HPL:   His recent labs show LDL of 113.  I will lower the dose of his  Lipitor to 40 mg from 80 mg for subsequent refills.    3)  Weight/Diet: He does not have access weight to lose, CDE Consult will be initiated , exercise, and detailed carbohydrates information provided.  5) hypothyroidism -He is currently on NP thyroid 30 mg daily.  He will have repeat thyroid function test before his next visit.  He will be considered for levothyroxine treatment as necessary after his next visit.  6) Chronic Care/Health Maintenance:  -he  is on Statin medications and  is encouraged to continue to follow up with Ophthalmology, Dentist,  Podiatrist at least yearly or according to recommendations, and advised to  stay away from smoking. I have recommended yearly flu vaccine and pneumonia vaccination at least every 5 years;  and  sleep for at least 7 hours a day.  - I advised patient to maintain close follow up with Doree Albee, MD for primary care needs.    Time for this visit: 15 minutes. Mark Foster  participated in the discussions, expressed understanding, and voiced agreement with the above plans.  All questions were answered to his satisfaction. he is encouraged to contact clinic should he have any questions or concerns prior to his return visit.   Follow up plan: - Return in about 9 weeks (around 06/12/2019) for  Follow up with Pre-visit Labs, Meter, and Logs.  Glade Lloyd, MD [facility] [address] [place], [place] [address] Phone: [phone]  Fax: [phone]    04/10/2019, 4:40 PM  This note was partially dictated with voice recognition software. Similar sounding words can be transcribed inadequately or may not  be corrected upon review.

## 2019-04-11 ENCOUNTER — Other Ambulatory Visit: Payer: Self-pay

## 2019-04-11 ENCOUNTER — Inpatient Hospital Stay (HOSPITAL_COMMUNITY): Payer: PRIVATE HEALTH INSURANCE

## 2019-04-11 ENCOUNTER — Other Ambulatory Visit (HOSPITAL_COMMUNITY): Payer: Self-pay | Admitting: Hematology

## 2019-04-11 VITALS — BP 124/70 | HR 77 | Temp 98.0°F | Resp 18

## 2019-04-11 DIAGNOSIS — C259 Malignant neoplasm of pancreas, unspecified: Secondary | ICD-10-CM

## 2019-04-11 DIAGNOSIS — Z95828 Presence of other vascular implants and grafts: Secondary | ICD-10-CM

## 2019-04-11 DIAGNOSIS — Z5111 Encounter for antineoplastic chemotherapy: Secondary | ICD-10-CM | POA: Diagnosis not present

## 2019-04-11 MED ORDER — ONDANSETRON HCL 8 MG PO TABS: 8.0000 mg | ORAL_TABLET | Freq: Four times a day (QID) | ORAL | 3 refills | Status: DC | PRN

## 2019-04-11 MED ORDER — SODIUM CHLORIDE 0.9 % IV SOLN
INTRAVENOUS | Status: DC
  Administered 2019-04-11: 13:00:00 via INTRAVENOUS

## 2019-04-11 MED ORDER — ONDANSETRON HCL 4 MG/2ML IJ SOLN
8.0000 mg | Freq: Once | INTRAMUSCULAR | Status: AC
  Administered 2019-04-11: 8 mg via INTRAVENOUS

## 2019-04-11 MED ORDER — SODIUM CHLORIDE 0.9% FLUSH
10.0000 mL | INTRAVENOUS | Status: DC | PRN
  Administered 2019-04-11: 13:00:00 10 mL
  Filled 2019-04-11: qty 10

## 2019-04-11 MED ORDER — HEPARIN SOD (PORK) LOCK FLUSH 100 UNIT/ML IV SOLN
500.0000 [IU] | Freq: Once | INTRAVENOUS | Status: AC | PRN
  Administered 2019-04-11: 500 [IU]

## 2019-04-11 MED ORDER — PEGFILGRASTIM-JMDB 6 MG/0.6ML ~~LOC~~ SOSY
6.0000 mg | PREFILLED_SYRINGE | Freq: Once | SUBCUTANEOUS | Status: AC
  Administered 2019-04-11: 6 mg via SUBCUTANEOUS
  Filled 2019-04-11: qty 0.6

## 2019-04-11 NOTE — Patient Instructions (Signed)
[facility] at [facility]  Discharge Instructions:   _______________________________________________________________  Thank you for choosing [facility] at [facility] to provide your oncology and hematology care.  To afford each patient quality time with our providers, please arrive at least 15 minutes before your scheduled appointment.  You need to re-schedule your appointment if you arrive 10 or more minutes late.  We strive to give you quality time with our providers, and arriving late affects you and other patients whose appointments are after yours.  Also, if you no show three or more times for appointments you may be dismissed from the clinic.  Again, thank you for choosing [facility] at [facility]. Our hope is that these requests will allow you access to exceptional care and in a timely manner. _______________________________________________________________  If you have questions after your visit, please contact our office at [phone] between the hours of 8:30 a.m. and 5:00 p.m. Voicemails left after 4:30 p.m. will not be returned until the following business day. _______________________________________________________________  For prescription refill requests, have your pharmacy contact our office. _______________________________________________________________  Recommendations made by the consultant and any test results will be sent to your referring physician. _______________________________________________________________ 

## 2019-04-11 NOTE — Progress Notes (Signed)
Patient came in today for pump d/c. Reporting nausea since he awoke this morning. Took a compazine but did not work. States he has has not had much to drink today. Reported to NP Reynolds Bowl, will give 500 ml bolus of saline and iv push zofran. PO zofran script sent in to pharmacy by NP.   Hillary Schwegler returns today for port de access and flush after 46 hr continous infusion of 65fu. Tolerated infusion without problems. Portacath located left chest wall was  deaccessed and flushed with 34ml NS and 500U/31ml Heparin and needle removed intact.  Procedure without incident. Patient tolerated procedure well.  Treatment given per orders. Patient tolerated it well without problems. Vitals stable and discharged home from clinic ambulatory. Follow up as scheduled.

## 2019-04-11 NOTE — Progress Notes (Incomplete)
zofr 

## 2019-04-18 ENCOUNTER — Encounter: Payer: Self-pay | Admitting: Orthopedic Surgery

## 2019-04-18 ENCOUNTER — Other Ambulatory Visit: Payer: Self-pay

## 2019-04-18 ENCOUNTER — Ambulatory Visit (INDEPENDENT_AMBULATORY_CARE_PROVIDER_SITE_OTHER): Payer: PRIVATE HEALTH INSURANCE | Admitting: Orthopedic Surgery

## 2019-04-18 VITALS — BP 116/81 | HR 78 | Ht 70.0 in | Wt 149.0 lb

## 2019-04-18 DIAGNOSIS — S93412D Sprain of calcaneofibular ligament of left ankle, subsequent encounter: Secondary | ICD-10-CM | POA: Diagnosis not present

## 2019-04-18 NOTE — Addendum Note (Signed)
Addended by: Carole Civil on: 04/18/2019 10:42 AM   Modules accepted: Level of Service
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
## 2019-04-23 ENCOUNTER — Other Ambulatory Visit: Payer: Self-pay

## 2019-04-23 ENCOUNTER — Encounter (HOSPITAL_COMMUNITY): Payer: Self-pay | Admitting: Hematology

## 2019-04-23 ENCOUNTER — Inpatient Hospital Stay (HOSPITAL_COMMUNITY): Payer: PRIVATE HEALTH INSURANCE

## 2019-04-23 ENCOUNTER — Inpatient Hospital Stay (HOSPITAL_BASED_OUTPATIENT_CLINIC_OR_DEPARTMENT_OTHER): Payer: PRIVATE HEALTH INSURANCE | Admitting: Hematology

## 2019-04-23 VITALS — HR 78 | Resp 16

## 2019-04-23 DIAGNOSIS — C259 Malignant neoplasm of pancreas, unspecified: Secondary | ICD-10-CM

## 2019-04-23 DIAGNOSIS — Z95828 Presence of other vascular implants and grafts: Secondary | ICD-10-CM

## 2019-04-23 DIAGNOSIS — Z5111 Encounter for antineoplastic chemotherapy: Secondary | ICD-10-CM | POA: Diagnosis not present

## 2019-04-23 LAB — CBC WITH DIFFERENTIAL/PLATELET
Abs Immature Granulocytes: 0.09 10*3/uL — ABNORMAL HIGH (ref 0.00–0.07)
Basophils Absolute: 0.1 10*3/uL (ref 0.0–0.1)
Basophils Relative: 1 %
Eosinophils Absolute: 0.4 10*3/uL (ref 0.0–0.5)
Eosinophils Relative: 3 %
HCT: 41.4 % (ref 39.0–52.0)
Hemoglobin: 13.4 g/dL (ref 13.0–17.0)
Immature Granulocytes: 1 %
Lymphocytes Relative: 24 %
Lymphs Abs: 2.8 10*3/uL (ref 0.7–4.0)
MCH: 30.9 pg (ref 26.0–34.0)
MCHC: 32.4 g/dL (ref 30.0–36.0)
MCV: 95.6 fL (ref 80.0–100.0)
Monocytes Absolute: 1.9 10*3/uL — ABNORMAL HIGH (ref 0.1–1.0)
Monocytes Relative: 16 %
Neutro Abs: 6.5 10*3/uL (ref 1.7–7.7)
Neutrophils Relative %: 55 %
Platelets: 307 10*3/uL (ref 150–400)
RBC: 4.33 MIL/uL (ref 4.22–5.81)
RDW: 18.5 % — ABNORMAL HIGH (ref 11.5–15.5)
WBC: 11.7 10*3/uL — ABNORMAL HIGH (ref 4.0–10.5)
nRBC: 0.2 % (ref 0.0–0.2)

## 2019-04-23 LAB — COMPREHENSIVE METABOLIC PANEL
ALT: 23 U/L (ref 0–44)
AST: 18 U/L (ref 15–41)
Albumin: 3.9 g/dL (ref 3.5–5.0)
Alkaline Phosphatase: 175 U/L — ABNORMAL HIGH (ref 38–126)
Anion gap: 7 (ref 5–15)
BUN: 9 mg/dL (ref 8–23)
CO2: 27 mmol/L (ref 22–32)
Calcium: 9 mg/dL (ref 8.9–10.3)
Chloride: 107 mmol/L (ref 98–111)
Creatinine, Ser: 0.51 mg/dL — ABNORMAL LOW (ref 0.61–1.24)
GFR calc Af Amer: >60 mL/min (ref >60)
GFR calc non Af Amer: >60 mL/min (ref >60)
Glucose, Bld: 231 mg/dL — ABNORMAL HIGH (ref 70–99)
Potassium: 3.9 mmol/L (ref 3.5–5.1)
Sodium: 141 mmol/L (ref 135–145)
Total Bilirubin: 0.5 mg/dL (ref 0.3–1.2)
Total Protein: 6.8 g/dL (ref 6.5–8.1)

## 2019-04-23 MED ORDER — PALONOSETRON HCL INJECTION 0.25 MG/5ML
0.2500 mg | Freq: Once | INTRAVENOUS | Status: AC
  Administered 2019-04-23: 0.25 mg via INTRAVENOUS

## 2019-04-23 MED ORDER — PALONOSETRON HCL INJECTION 0.25 MG/5ML
INTRAVENOUS | Status: AC
  Filled 2019-04-23: qty 5

## 2019-04-23 MED ORDER — DEXTROSE 5 % IV SOLN
Freq: Once | INTRAVENOUS | Status: AC
  Administered 2019-04-23: 10:00:00 via INTRAVENOUS

## 2019-04-23 MED ORDER — ATROPINE SULFATE 1 MG/ML IJ SOLN
0.5000 mg | Freq: Once | INTRAMUSCULAR | Status: AC | PRN
  Administered 2019-04-23: 0.5 mg via INTRAVENOUS
  Filled 2019-04-23: qty 1

## 2019-04-23 MED ORDER — SODIUM CHLORIDE 0.9 % IV SOLN
150.0000 mg/m2 | Freq: Once | INTRAVENOUS | Status: AC
  Administered 2019-04-23: 280 mg via INTRAVENOUS
  Filled 2019-04-23: qty 4

## 2019-04-23 MED ORDER — OXALIPLATIN CHEMO INJECTION 100 MG/20ML
63.7500 mg/m2 | Freq: Once | INTRAVENOUS | Status: AC
  Administered 2019-04-23: 120 mg via INTRAVENOUS
  Filled 2019-04-23: qty 20

## 2019-04-23 MED ORDER — MECLIZINE HCL 25 MG PO TABS: 25.0000 mg | ORAL_TABLET | Freq: Three times a day (TID) | ORAL | 0 refills | Status: DC | PRN

## 2019-04-23 MED ORDER — SODIUM CHLORIDE 0.9% FLUSH
10.0000 mL | INTRAVENOUS | Status: DC | PRN
  Administered 2019-04-23: 10 mL
  Filled 2019-04-23: qty 10

## 2019-04-23 MED ORDER — SODIUM CHLORIDE 0.9 % IV SOLN
Freq: Once | INTRAVENOUS | Status: AC
  Administered 2019-04-23: 10:00:00 via INTRAVENOUS
  Filled 2019-04-23: qty 5

## 2019-04-23 MED ORDER — SODIUM CHLORIDE 0.9 % IV SOLN
376.0000 mg/m2 | Freq: Once | INTRAVENOUS | Status: AC
  Administered 2019-04-23: 700 mg via INTRAVENOUS
  Filled 2019-04-23: qty 35

## 2019-04-23 MED ORDER — SODIUM CHLORIDE 0.9 % IV SOLN
2400.0000 mg/m2 | INTRAVENOUS | Status: DC
  Administered 2019-04-23: 4450 mg via INTRAVENOUS
  Filled 2019-04-23: qty 89

## 2019-04-23 NOTE — Patient Instructions (Signed)
[facility] Discharge Instructions  Labs drawn from portacath today   Thank you for choosing [facility] at [facility] to provide your oncology and hematology care.  To afford each patient quality time with our provider, please arrive at least 15 minutes before your scheduled appointment time.   If you have a lab appointment with the Cancer Center please come in thru the  Main Entrance and check in at the main information desk  You need to re-schedule your appointment should you arrive 10 or more minutes late.  We strive to give you quality time with our providers, and arriving late affects you and other patients whose appointments are after yours.  Also, if you no show three or more times for appointments you may be dismissed from the clinic at the providers discretion.     Again, thank you for choosing [facility].  Our hope is that these requests will decrease the amount of time that you wait before being seen by our physicians.       _____________________________________________________________  Should you have questions after your visit to [facility], please contact our office at [phone] between the hours of 8:00 a.m. and 4:30 p.m.  Voicemails left after 4:00 p.m. will not be returned until the following business day.  For prescription refill requests, have your pharmacy contact our office and allow 72 hours.    Cancer Center Support Programs:   > Cancer Support Group  2nd Tuesday of the month 1pm-2pm, Journey Room   

## 2019-04-23 NOTE — Progress Notes (Signed)
Labs reviewed with MD today. Proceed with treatment as planned.   Treatment given per orders. Patient tolerated it well without problems. Vitals stable and discharged home from clinic ambulatory. Follow up as scheduled.  

## 2019-04-23 NOTE — Patient Instructions (Signed)
[facility] Discharge Instructions for Patients Receiving Chemotherapy  Today you received the following chemotherapy agents   To help prevent nausea and vomiting after your treatment, we encourage you to take your nausea medication   If you develop nausea and vomiting that is not controlled by your nausea medication, call the clinic.   BELOW ARE SYMPTOMS THAT SHOULD BE REPORTED IMMEDIATELY:  *FEVER GREATER THAN 100.5 F  *CHILLS WITH OR WITHOUT FEVER  NAUSEA AND VOMITING THAT IS NOT CONTROLLED WITH YOUR NAUSEA MEDICATION  *UNUSUAL SHORTNESS OF BREATH  *UNUSUAL BRUISING OR BLEEDING  TENDERNESS IN MOUTH AND THROAT WITH OR WITHOUT PRESENCE OF ULCERS  *URINARY PROBLEMS  *BOWEL PROBLEMS  UNUSUAL RASH Items with * indicate a potential emergency and should be followed up as soon as possible.  Feel free to call the clinic should you have any questions or concerns. The clinic phone number is [phone].  Please show the CHEMO ALERT CARD at check-in to the Emergency Department and triage nurse.   

## 2019-04-23 NOTE — Patient Instructions (Signed)
[facility] at [facility] Discharge Instructions  You were seen today by Dr. Katragadda. He went over your recent lab results. He will see you back in 2 weeks for labs and follow up.   Thank you for choosing [facility] at [facility] to provide your oncology and hematology care.  To afford each patient quality time with our provider, please arrive at least 15 minutes before your scheduled appointment time.   If you have a lab appointment with the Cancer Center please come in thru the  Main Entrance and check in at the main information desk  You need to re-schedule your appointment should you arrive 10 or more minutes late.  We strive to give you quality time with our providers, and arriving late affects you and other patients whose appointments are after yours.  Also, if you no show three or more times for appointments you may be dismissed from the clinic at the providers discretion.     Again, thank you for choosing [facility].  Our hope is that these requests will decrease the amount of time that you wait before being seen by our physicians.       _____________________________________________________________  Should you have questions after your visit to  Cancer Center, please contact our office at [phone] between the hours of 8:00 a.m. and 4:30 p.m.  Voicemails left after 4:00 p.m. will not be returned until the following business day.  For prescription refill requests, have your pharmacy contact our office and allow 72 hours.    Cancer Center Support Programs:   > Cancer Support Group  2nd Tuesday of the month 1pm-2pm, Journey Room    

## 2019-04-23 NOTE — Progress Notes (Signed)
Mark Foster, [place] [address]   CLINIC:  Medical Oncology/Hematology  PCP:  Mark Albee, Mark Foster [place] [address] [phone]   REASON FOR VISIT:  Follow-up for pancreatic cancer      BRIEF ONCOLOGIC HISTORY:  Oncology History  Pancreatic adenocarcinoma ([place])  08/20/2018 Imaging   CT abdomen/pelvis w/ contrast: IMPRESSION: Atrophy and ductal dilatation involving the pancreatic tail, with suspected small soft tissue mass in the pancreatic body which could represent pancreatic carcinoma. Abdomen MRI and MRCP without and with contrast is recommended for further evaluation.  No evidence of hepatobiliary disease.   08/30/2018 Imaging   MRI abdomen w/ contrast: IMPRESSION: 1. Although not definitive, there remains concern of a small hypoenhancing mass at the junction of the pancreatic body and tail associated with atrophy and ductal dilatation in the pancreatic tail. This remains concerning for pancreatic neoplasm. Postinflammatory stricture less likely. Besides a tiny cystic lesion in the pancreatic tail, there are no other signs of previous pancreatitis. Further evaluation with endoscopic ultrasound for possible biopsy strongly recommended. 2. No evidence of metastatic disease. 3. Cholelithiasis without evidence of cholecystitis or biliary dilatation.   09/03/2018 Initial Diagnosis   Pancreatic adenocarcinoma ([facility])   10/03/2018 Procedure   EUS: - 2.6cm irregularly shaped mass in the body of the pancreas that causing main pancreatic duct obstruction and dilation. The mass abuts the splenic vessels but no other significant vascular structures and it was sampled with trangastric EUS FNA. Preliminary cytology is + for malignancy, likely well-differentiated adenocarcinoma. It appears surgically resectable.   10/03/2018 Pathology Results   Accession: [id number]  FINE NEEDLE ASPIRATION, ENDOSCOPIC, PANCREAS BODY  (SPECIMEN 1 OF 1 COLLECTED 10/03/18): ADENOCARCINOMA.   10/17/2018 Imaging   PET: IMPRESSION: 1. Tiny focus of hypermetabolism identified in the body of pancreas, adjacent to the abrupt cut off of the main pancreatic duct. No evidence for hypermetabolic metastatic disease in the neck, chest, abdomen, or pelvis. 2. Cholelithiasis. 3.  Aortic Atherosclerois (ICD10-170.0) 4. 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  The Hereditary Gene Panel offered by [facility] includes sequencing and/or deletion duplication testing of the following 47 genes: APC, ATM, AXIN2, BARD1, BMPR1A, BRCA1, BRCA2, BRIP1, CDH1, CDK4, CDKN2A (p14ARF), CDKN2A (p16INK4a), CHEK2, CTNNA1, DICER1, EPCAM (Deletion/duplication testing only), GREM1 (promoter region deletion/duplication testing only), KIT, MEN1, MLH1, MSH2, MSH3, MSH6, MUTYH, NBN, NF1, NHTL1, PALB2, PDGFRA, PMS2, POLD1, POLE, PTEN, RAD50, RAD51C, RAD51D, SDHB, SDHC, SDHD, SMAD4, SMARCA4. STK11, TP53, TSC1, TSC2, and VHL.  The following genes were evaluated for sequence changes only: SDHA and HOXB13 c.251G>A variant only. The report date is November 12, 2018.   11/20/2018 Surgery   Distal subtotal pancreatectomy with splenectomy at [facility]   11/20/2018 Surgery   TUMOR   Tumor Site:  Pancreatic body    Histologic Type:  Ductal adenocarcinoma    Histologic Grade:  G1: Well differentiated    Tumor Size:  Greatest dimension in Centimeters (cm): 2.8 Centimeters (cm)    Additional Dimension in Centimeters (cm):  2.7 Centimeters (cm)    Additional Dimension in Centimeters (cm):  1.8 Centimeters (cm)   Tumor Extent:      Tumor Extension:  Tumor is confined to pancreas    Accessory Findings:      Treatment Effect:  No known presurgical therapy     Lymphovascular Invasion:  Not identified     Perineural Invasion:  Not identified  MARGINS   Margins:      Proximal  Pancreatic Parenchymal Margin:  Uninvolved by invasive carcinoma and pancreatic high-grade intraepithelial neoplasia      Distance of Invasive Carcinoma from Margin:  0.6 Centimeters (cm)   :      Other Margin:  Splenic margin.      Margin Status:  Uninvolved by invasive carcinoma   LYMPH NODES  Number of Lymph Nodes Involved:  2   Number of Lymph Nodes Examined:  16   PATHOLOGIC STAGE CLASSIFICATION (pTNM, AJCC 8th Edition)  TNM Descriptors:  Not applicable   Primary Tumor (pT):  pT2   Regional Lymph Nodes (pN):  pN1   ADDITIONAL FINDINGS  Additional Pathologic Findings:  Pancreatic intraepithelial neoplasia    Highest Grade (PanIN):  High grade PanIN is present.   Additional Pathologic Findings:  Benign unilocular pancreatic cyst (1.3 cm in greatest dimension) at the pancreatic tail.   12/30/2018 -  Chemotherapy   The patient had palonosetron (ALOXI) injection 0.25 mg, 0.25 mg, Intravenous,  Once, 9 of 12 cycles Administration: 0.25 mg (12/30/2018), 0.25 mg (01/13/2019), 0.25 mg (01/28/2019), 0.25 mg (02/11/2019), 0.25 mg (02/25/2019), 0.25 mg (03/11/2019), 0.25 mg (03/25/2019), 0.25 mg (04/09/2019), 0.25 mg (04/23/2019) pegfilgrastim (NEULASTA) injection 6 mg, 6 mg, Subcutaneous, Once, 7 of 7 cycles Administration: 6 mg (01/01/2019), 6 mg (01/15/2019), 6 mg (01/30/2019), 6 mg (02/13/2019), 6 mg (02/27/2019), 6 mg (03/13/2019), 6 mg (03/27/2019) pegfilgrastim-jmdb (FULPHILA) injection 6 mg, 6 mg, Subcutaneous,  Once, 2 of 5 cycles Administration: 6 mg (04/11/2019) irinotecan (CAMPTOSAR) 280 mg in sodium chloride 0.9 % 500 mL chemo infusion, 150 mg/m2 = 280 mg (100 % of original dose 150 mg/m2), Intravenous,  Once, 9 of 12 cycles Dose modification: 150 mg/m2 (original dose 150 mg/m2, Cycle 1, Reason: Provider Judgment) Administration: 280 mg (12/30/2018), 280 mg (01/13/2019), 280 mg (01/28/2019), 280 mg (02/11/2019), 280 mg (02/25/2019), 280 mg (03/11/2019), 280 mg (03/25/2019),  280 mg (04/09/2019), 280 mg (04/23/2019) leucovorin 700 mg in sodium chloride 0.9 % 250 mL infusion, 744 mg, Intravenous,  Once, 9 of 12 cycles Administration: 700 mg (12/30/2018), 700 mg (01/13/2019), 700 mg (01/28/2019), 700 mg (02/11/2019), 700 mg (02/25/2019), 700 mg (03/11/2019), 700 mg (03/25/2019), 700 mg (04/09/2019), 700 mg (04/23/2019) oxaliplatin (ELOXATIN) 120 mg in dextrose 5 % 500 mL chemo infusion, 63.75 mg/m2 = 120 mg (75 % of original dose 85 mg/m2), Intravenous,  Once, 9 of 12 cycles Dose modification: 63.75 mg/m2 (75 % of original dose 85 mg/m2, Cycle 1, Reason: Other (see comments), Comment: high risk for worsening neuropathy) Administration: 120 mg (12/30/2018), 120 mg (01/13/2019), 120 mg (01/28/2019), 120 mg (02/11/2019), 120 mg (02/25/2019), 120 mg (03/11/2019), 120 mg (03/25/2019), 120 mg (04/09/2019), 120 mg (04/23/2019) fosaprepitant (EMEND) 150 mg, dexamethasone (DECADRON) 12 mg in sodium chloride 0.9 % 145 mL IVPB, , Intravenous,  Once, 9 of 12 cycles Administration:  (12/30/2018),  (01/13/2019),  (01/28/2019),  (02/11/2019),  (02/25/2019),  (03/11/2019),  (03/25/2019),  (04/09/2019),  (04/23/2019) fluorouracil (ADRUCIL) 4,450 mg in sodium chloride 0.9 % 61 mL chemo infusion, 2,400 mg/m2 = 4,450 mg, Intravenous, 1 Day/Dose, 9 of 12 cycles Administration: 4,450 mg (12/30/2018), 4,450 mg (01/13/2019), 4,450 mg (01/28/2019), 4,450 mg (02/11/2019), 4,450 mg (02/25/2019), 4,450 mg (03/11/2019), 4,450 mg (03/25/2019), 4,450 mg (04/09/2019), 4,450 mg (04/23/2019)  for chemotherapy treatment.       CANCER STAGING: Cancer Staging No matching staging information was found for the patient.   INTERVAL HISTORY:  Mark Foster 63 y.o. male seen for follow-up  of pancreatic cancer.  He is here for cycle 1 of chemotherapy.  Last cycle was on 04/09/2019.  Denied any nausea, vomiting, diarrhea or constipation after last treatment.  Appetite was 100%.  Energy levels are 50%.  No pain reported.  Occasional cough has been stable.  He  complained of dizziness which started a week ago.  He experienced a room was spinning as if he was drunk.  This is brought on when he extends his head.  Denied any recent upper respiratory infections.  Denies any near syncopal episodes.  Blood pressure is 102/64.  He has been checking blood sugars at home when these episodes happen.  They are between 203 100.  REVIEW OF SYSTEMS:  Review of Systems  Neurological: Positive for dizziness.  All other systems reviewed and are negative.    PAST MEDICAL/SURGICAL HISTORY:  Past Medical History:  Diagnosis Date  . Cervical compression fracture ([place])   . Cervical spinal stenosis   . Diabetic neuropathy ([facility])    Bilateral legs  . Diverticulitis   . Family history of pancreatic cancer   . History of kidney stones   . Hypothyroidism   . Pancreatic cancer ([place])   . Stenosis of left vertebral artery   . Stroke [facility])    2011  . Type 2 diabetes mellitus ([place])    Past Surgical History:  Procedure Laterality Date  . APPENDECTOMY    . COLON RESECTION     For diverticulitis  . COLON SURGERY    . ESOPHAGOGASTRODUODENOSCOPY N/A 10/03/2018   Procedure: ESOPHAGOGASTRODUODENOSCOPY (EGD);  Surgeon: [name], Mark Foster;  Location: [name];  Service: Endoscopy;  Laterality: N/A;  . EUS N/A 10/03/2018   Procedure: UPPER ENDOSCOPIC ULTRASOUND (EUS) RADIAL;  Surgeon: [name], Mark Foster;  Location: [facility];  Service: Endoscopy;  Laterality: N/A;  . FINE NEEDLE ASPIRATION N/A 10/03/2018   Procedure: FINE NEEDLE ASPIRATION (FNA) LINEAR;  Surgeon: [name], Mark Foster;  Location: [facility] END[facility];  Service: Endoscopy;  Laterality: N/A;  . IR ANGIO INTRA EXTRACRAN SEL COM CAROTID INNOMINATE BILAT MOD SED  01/21/2018  . IR ANGIO INTRA EXTRACRAN SEL COM CAROTID INNOMINATE UNI R MOD SED  03/21/2018  . IR ANGIO VERTEBRAL SEL VERTEBRAL BILAT MOD SED  01/21/2018  . IR TRANSCATH EXCRAN VERT OR CAR A STENT  03/21/2018  . PORTACATH PLACEMENT Left 12/23/2018    Procedure: INSERTION PORT-A-CATH;  Surgeon: Virl Cagey, Mark Foster;  Location: [facility];  Service: General;  Laterality: Left;  . RADIOLOGY WITH ANESTHESIA N/A 03/21/2018   Procedure: IR WITH ANESTHESIA WITH STENT PLACEMENT;  Surgeon: Luanne Bras, Mark Foster;  Location: [facility];  Service: Radiology;  Laterality: N/A;  . ROTATOR CUFF REPAIR     Left     SOCIAL HISTORY:  Social History   Socioeconomic History  . Marital status: Single    Spouse name: Not on file  . Number of children: 2  . Years of education: Not on file  . Highest education level: Not on file  Occupational History  . Occupation: Estate agent  Social Needs  . Financial resource strain: Somewhat hard  . Food insecurity    Worry: Never true    Inability: Never true  . Transportation needs    Medical: No    Non-medical: No  Tobacco Use  . Smoking status: Never Smoker  . Smokeless tobacco: Never Used  Substance and Sexual Activity  . Alcohol use: Not Currently  . Drug use: Never  . Sexual activity: Not  on file  Lifestyle  . Physical activity    Days per week: 0 days    Minutes per session: 0 min  . Stress: To some extent  Relationships  . Social connections    Talks on phone: More than three times a week    Gets together: Once a week    Attends religious service: More than 4 times per year    Active member of club or organization: No    Attends meetings of clubs or organizations: Never    Relationship status: Divorced  . Intimate partner violence    Fear of current or ex partner: No    Emotionally abused: No    Physically abused: No    Forced sexual activity: No  Other Topics Concern  . Not on file  Social History Narrative  . Not on file    FAMILY HISTORY:  Family History  Problem Relation Age of Onset  . Stroke Mother   . Pancreatic cancer Father 51       d. 73  . Stroke Maternal Grandmother   . Heart attack Maternal Grandfather   . Heart Problems Paternal Grandfather   . Scoliosis  Daughter   . Muscular dystrophy Grandson     CURRENT MEDICATIONS:  Outpatient Encounter Medications as of 04/23/2019  Medication Sig  . aspirin 81 MG tablet Take 1 tablet (81 mg total) by mouth daily.  Marland Kitchen atorvastatin (LIPITOR) 80 MG tablet Take 80 mg by mouth daily at 6 PM.   . dextrose 5 % SOLN 1,000 mL with fluorouracil 5 GM/100ML SOLN Inject into the vein over 48 hr.  . ECHINACEA EXTRACT PO Take 2 tablets by mouth daily.   Marland Kitchen FLUOROURACIL IV Inject into the vein every 14 (fourteen) days.  Marland Kitchen gabapentin (NEURONTIN) 300 MG capsule Take 1 capsule (300 mg total) by mouth 2 (two) times daily.  Marland Kitchen HYDROcodone-acetaminophen (NORCO/VICODIN) 5-325 MG tablet Take 1 tablet by mouth every 4 (four) hours as needed.  . Insulin Glargine (BASAGLAR KWIKPEN) 100 UNIT/ML SOPN Inject 0.42 mLs (42 Units total) into the skin at bedtime.  . Insulin Pen Needle 32G X 4 MM MISC 1 Device by Does not apply route daily.  . IRINOTECAN HCL IV Inject into the vein every 14 (fourteen) days.  Marland Kitchen LEUCOVORIN CALCIUM IV Inject into the vein every 14 (fourteen) days.  Marland Kitchen lidocaine-prilocaine (EMLA) cream Apply pea-sized amount to port-a-cath site and cover with plastic wrap one hour prior to chemotherapy appointments  . loperamide (IMODIUM A-D) 2 MG tablet Take 1 tablet (2 mg total) by mouth as needed. Take 2 at diarrhea onset , then 1 every 2hr until 12hrs with no BM. May take 2 every 4hrs at night. If diarrhea recurs repeat.  . meclizine (ANTIVERT) 25 MG tablet Take 1 tablet (25 mg total) by mouth 3 (three) times daily as needed for dizziness.  . NP THYROID 30 MG tablet Take 30 mg by mouth daily before breakfast.   . Omega-3 1000 MG CAPS Take 1,000 mg by mouth daily.  . ondansetron (ZOFRAN) 8 MG tablet Take 1 tablet (8 mg total) by mouth every 6 (six) hours as needed for nausea or vomiting.  . OXALIPLATIN IV Inject into the vein every 14 (fourteen) days.  . pantoprazole (PROTONIX) 20 MG tablet Take 20 mg by mouth daily.   .  prochlorperazine (COMPAZINE) 10 MG tablet Take 1 tablet (10 mg total) by mouth every 6 (six) hours as needed (NAUSEA).  Marland Kitchen sertraline (ZOLOFT) 50 MG  tablet Take 50 mg by mouth every morning.   . tamsulosin (FLOMAX) 0.4 MG CAPS capsule Take 0.4 mg by mouth daily.   . ticagrelor (BRILINTA) 90 MG TABS tablet Take 1 tablet (90 mg total) by mouth 2 (two) times daily.  . traMADol (ULTRAM) 50 MG tablet   . vitamin B-12 (CYANOCOBALAMIN) 1000 MCG tablet Take 1,000 mcg by mouth daily.   Facility-Administered Encounter Medications as of 04/23/2019  Medication  . 0.9 %  sodium chloride infusion  . 0.9 %  sodium chloride infusion  . dextrose 5 % solution  . sodium chloride flush (NS) 0.9 % injection 10 mL    ALLERGIES:  Allergies  Allergen Reactions  . Demerol [Meperidine Hcl] Other (See Comments)    convulsions     PHYSICAL EXAM:  ECOG Performance status: 1  Vitals:   04/23/19 0836  BP: 102/64  Pulse: 80  Resp: 16  Temp: (!) 97.1 F (36.2 C)  SpO2: 100%   Filed Weights   04/23/19 0836  Weight: 155 lb 9.6 oz (70.6 kg)    Physical Exam Vitals signs reviewed.  Constitutional:      Appearance: Normal appearance.  Cardiovascular:     Rate and Rhythm: Normal rate and regular rhythm.     Heart sounds: Normal heart sounds.  Pulmonary:     Effort: Pulmonary effort is normal.     Breath sounds: Normal breath sounds.  Abdominal:     General: There is no distension.     Palpations: Abdomen is soft. There is no mass.  Musculoskeletal:        General: No swelling.  Skin:    General: Skin is warm.  Neurological:     General: No focal deficit present.     Mental Status: He is alert and oriented to person, place, and time.  Psychiatric:        Mood and Affect: Mood normal.        Behavior: Behavior normal.    Left ankle in a boot.  LABORATORY DATA:  I have reviewed the labs as listed.  CBC    Component Value Date/Time   WBC 11.7 (H) 04/23/2019 0822   RBC 4.33 04/23/2019 0822    HGB 13.4 04/23/2019 0822   HGB 14.9 12/11/2018 1018   HCT 41.4 04/23/2019 0822   PLT 307 04/23/2019 0822   PLT 335 12/11/2018 1018   MCV 95.6 04/23/2019 0822   MCH 30.9 04/23/2019 0822   MCHC 32.4 04/23/2019 0822   RDW 18.5 (H) 04/23/2019 0822   LYMPHSABS 2.8 04/23/2019 0822   MONOABS 1.9 (H) 04/23/2019 0822   EOSABS 0.4 04/23/2019 0822   BASOSABS 0.1 04/23/2019 0822   CMP Latest Ref Rng & Units 04/23/2019 04/09/2019 03/25/2019  Glucose 70 - 99 mg/dL 231(H) 220(H) 210(H)  BUN 8 - 23 mg/dL '9 16 18  '$ Creatinine 0.61 - 1.24 mg/dL 0.51(L) 0.59(L) 0.55(L)  Sodium 135 - 145 mmol/L 141 140 140  Potassium 3.5 - 5.1 mmol/L 3.9 3.9 3.9  Chloride 98 - 111 mmol/L 107 106 107  CO2 22 - 32 mmol/L '27 25 25  '$ Calcium 8.9 - 10.3 mg/dL 9.0 9.1 9.0  Total Protein 6.5 - 8.1 g/dL 6.8 6.9 6.8  Total Bilirubin 0.3 - 1.2 mg/dL 0.5 0.6 0.7  Alkaline Phos 38 - 126 U/L 175(H) 183(H) 190(H)  AST 15 - 41 U/L '18 16 17  '$ ALT 0 - 44 U/L '23 19 21       '$ DIAGNOSTIC IMAGING:  I  have independently reviewed the scans and discussed with the patient.      ASSESSMENT & PLAN:   Pancreatic adenocarcinoma ([facility]) 1.  Stage IIb (T2N1) pancreatic adenocarcinoma: -Status post distal pancreatectomy and splenectomy at [facility] by Dr. Zenia Resides on 11/20/2018. 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 - 8 cycles of adjuvant FOLFIRINOX from 12/30/2018 through 04/09/2019. - He has tolerated last cycle very well.  Did not have any GI side effects. -However he complained of vertigo symptoms with the room spinning when he extends his head.  He denied any recent respiratory infections. - We have reviewed his blood work.  They are adequate to proceed with next cycle.  I will start him on meclizine 25 mg 3 times a day.  3.  Post splenectomy state: -He received vaccinations prior to splenectomy.  We will follow-up on booster schedule.  3.  Diabetes: - He is continuing Basaglar 42 units daily.  4.  Neuropathy: -He has questionable  signs of neuropathy from his diabetes of long duration.  Denies any tingling or numbness next images. -We have cut back on his oxaliplatin dose by 25% from cycle 1.   Total time spent is 25 minutes with more than 50% of the time spent face-to-face discussing and reinforcing treatment plan and coordination of care.  Orders placed this encounter:  No orders of the defined types were placed in this encounter.     Derek Jack, Mark Foster [place] [phone]

## 2019-04-24 NOTE — Assessment & Plan Note (Addendum)
1.  Stage IIb (T2N1) pancreatic adenocarcinoma: -Status post distal pancreatectomy and splenectomy at [facility] by Dr. Zenia Resides on 11/20/2018. 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 - 8 cycles of adjuvant FOLFIRINOX from 12/30/2018 through 04/09/2019. - He has tolerated last cycle very well.  Did not have any GI side effects. -However he complained of vertigo symptoms with the room spinning when he extends his head.  He denied any recent respiratory infections. - We have reviewed his blood work.  They are adequate to proceed with next cycle.  I will start him on meclizine 25 mg 3 times a day.  3.  Post splenectomy state: -He received vaccinations prior to splenectomy.  We will follow-up on booster schedule.  3.  Diabetes: - He is continuing Basaglar 42 units daily.  4.  Neuropathy: -He has questionable signs of neuropathy from his diabetes of long duration.  Denies any tingling or numbness next images. -We have cut back on his oxaliplatin dose by 25% from cycle 1.

## 2019-04-25 ENCOUNTER — Ambulatory Visit: Payer: PRIVATE HEALTH INSURANCE | Admitting: Orthopedic Surgery

## 2019-04-25 ENCOUNTER — Other Ambulatory Visit: Payer: Self-pay

## 2019-04-25 ENCOUNTER — Inpatient Hospital Stay (HOSPITAL_COMMUNITY): Payer: PRIVATE HEALTH INSURANCE

## 2019-04-25 VITALS — BP 115/67 | HR 84 | Temp 96.9°F | Resp 16

## 2019-04-25 DIAGNOSIS — Z95828 Presence of other vascular implants and grafts: Secondary | ICD-10-CM

## 2019-04-25 DIAGNOSIS — C259 Malignant neoplasm of pancreas, unspecified: Secondary | ICD-10-CM

## 2019-04-25 DIAGNOSIS — Z5111 Encounter for antineoplastic chemotherapy: Secondary | ICD-10-CM | POA: Diagnosis not present

## 2019-04-25 MED ORDER — SODIUM CHLORIDE 0.9% FLUSH
10.0000 mL | INTRAVENOUS | Status: DC | PRN
  Administered 2019-04-25: 10 mL
  Filled 2019-04-25: qty 10

## 2019-04-25 MED ORDER — PEGFILGRASTIM-JMDB 6 MG/0.6ML ~~LOC~~ SOSY
6.0000 mg | PREFILLED_SYRINGE | Freq: Once | SUBCUTANEOUS | Status: AC
  Administered 2019-04-25: 6 mg via SUBCUTANEOUS
  Filled 2019-04-25: qty 0.6

## 2019-04-25 MED ORDER — HEPARIN SOD (PORK) LOCK FLUSH 100 UNIT/ML IV SOLN
500.0000 [IU] | Freq: Once | INTRAVENOUS | Status: AC | PRN
  Administered 2019-04-25: 500 [IU]

## 2019-04-25 NOTE — Progress Notes (Signed)
Mark Foster returns today for port de access and flush after 46 hr continous infusion of 71fu. Tolerated infusion without problems. Portacath located leftchest wall was  deaccessed and flushed with 48ml NS and 500U/24ml Heparin and needle removed intact.  Procedure without incident. Patient tolerated procedure well.  Fulphila 6mg  injection given per orders , see MAR.    Patient tolerated it well without problems. Vitals stable and discharged home from clinic ambulatory. Follow up as scheduled.

## 2019-05-07 ENCOUNTER — Inpatient Hospital Stay (HOSPITAL_COMMUNITY): Payer: PRIVATE HEALTH INSURANCE | Attending: Hematology

## 2019-05-07 ENCOUNTER — Inpatient Hospital Stay (HOSPITAL_COMMUNITY): Payer: PRIVATE HEALTH INSURANCE

## 2019-05-07 ENCOUNTER — Inpatient Hospital Stay (HOSPITAL_BASED_OUTPATIENT_CLINIC_OR_DEPARTMENT_OTHER): Payer: PRIVATE HEALTH INSURANCE | Admitting: Hematology

## 2019-05-07 ENCOUNTER — Encounter (HOSPITAL_COMMUNITY): Payer: Self-pay | Admitting: Hematology

## 2019-05-07 ENCOUNTER — Other Ambulatory Visit: Payer: Self-pay

## 2019-05-07 VITALS — BP 126/58 | HR 85 | Temp 97.3°F | Resp 18 | Wt 158.0 lb

## 2019-05-07 DIAGNOSIS — Z7982 Long term (current) use of aspirin: Secondary | ICD-10-CM | POA: Insufficient documentation

## 2019-05-07 DIAGNOSIS — Z7689 Persons encountering health services in other specified circumstances: Secondary | ICD-10-CM | POA: Insufficient documentation

## 2019-05-07 DIAGNOSIS — E1140 Type 2 diabetes mellitus with diabetic neuropathy, unspecified: Secondary | ICD-10-CM | POA: Diagnosis not present

## 2019-05-07 DIAGNOSIS — Z95828 Presence of other vascular implants and grafts: Secondary | ICD-10-CM

## 2019-05-07 DIAGNOSIS — E039 Hypothyroidism, unspecified: Secondary | ICD-10-CM | POA: Diagnosis not present

## 2019-05-07 DIAGNOSIS — R42 Dizziness and giddiness: Secondary | ICD-10-CM | POA: Diagnosis not present

## 2019-05-07 DIAGNOSIS — Z8673 Personal history of transient ischemic attack (TIA), and cerebral infarction without residual deficits: Secondary | ICD-10-CM | POA: Diagnosis not present

## 2019-05-07 DIAGNOSIS — C259 Malignant neoplasm of pancreas, unspecified: Secondary | ICD-10-CM

## 2019-05-07 DIAGNOSIS — C253 Malignant neoplasm of pancreatic duct: Secondary | ICD-10-CM | POA: Insufficient documentation

## 2019-05-07 DIAGNOSIS — Z5111 Encounter for antineoplastic chemotherapy: Secondary | ICD-10-CM | POA: Insufficient documentation

## 2019-05-07 DIAGNOSIS — Z90411 Acquired partial absence of pancreas: Secondary | ICD-10-CM | POA: Diagnosis not present

## 2019-05-07 DIAGNOSIS — R200 Anesthesia of skin: Secondary | ICD-10-CM

## 2019-05-07 DIAGNOSIS — Z9081 Acquired absence of spleen: Secondary | ICD-10-CM | POA: Diagnosis not present

## 2019-05-07 DIAGNOSIS — Z79899 Other long term (current) drug therapy: Secondary | ICD-10-CM | POA: Diagnosis not present

## 2019-05-07 LAB — CBC WITH DIFFERENTIAL/PLATELET
Abs Immature Granulocytes: 0.07 10*3/uL (ref 0.00–0.07)
Basophils Absolute: 0.1 10*3/uL (ref 0.0–0.1)
Basophils Relative: 1 %
Eosinophils Absolute: 0.3 10*3/uL (ref 0.0–0.5)
Eosinophils Relative: 2 %
HCT: 41.1 % (ref 39.0–52.0)
Hemoglobin: 13.4 g/dL (ref 13.0–17.0)
Immature Granulocytes: 1 %
Lymphocytes Relative: 25 %
Lymphs Abs: 3.1 10*3/uL (ref 0.7–4.0)
MCH: 31.8 pg (ref 26.0–34.0)
MCHC: 32.6 g/dL (ref 30.0–36.0)
MCV: 97.4 fL (ref 80.0–100.0)
Monocytes Absolute: 1.8 10*3/uL — ABNORMAL HIGH (ref 0.1–1.0)
Monocytes Relative: 14 %
Neutro Abs: 7 10*3/uL (ref 1.7–7.7)
Neutrophils Relative %: 57 %
Platelets: 290 10*3/uL (ref 150–400)
RBC: 4.22 MIL/uL (ref 4.22–5.81)
RDW: 18 % — ABNORMAL HIGH (ref 11.5–15.5)
WBC: 12.2 10*3/uL — ABNORMAL HIGH (ref 4.0–10.5)
nRBC: 0 % (ref 0.0–0.2)

## 2019-05-07 LAB — COMPREHENSIVE METABOLIC PANEL
ALT: 20 U/L (ref 0–44)
AST: 17 U/L (ref 15–41)
Albumin: 3.9 g/dL (ref 3.5–5.0)
Alkaline Phosphatase: 157 U/L — ABNORMAL HIGH (ref 38–126)
Anion gap: 5 (ref 5–15)
BUN: 17 mg/dL (ref 8–23)
CO2: 26 mmol/L (ref 22–32)
Calcium: 9 mg/dL (ref 8.9–10.3)
Chloride: 108 mmol/L (ref 98–111)
Creatinine, Ser: 0.54 mg/dL — ABNORMAL LOW (ref 0.61–1.24)
GFR calc Af Amer: >60 mL/min (ref >60)
GFR calc non Af Amer: >60 mL/min (ref >60)
Glucose, Bld: 214 mg/dL — ABNORMAL HIGH (ref 70–99)
Potassium: 4 mmol/L (ref 3.5–5.1)
Sodium: 139 mmol/L (ref 135–145)
Total Bilirubin: 0.4 mg/dL (ref 0.3–1.2)
Total Protein: 6.8 g/dL (ref 6.5–8.1)

## 2019-05-07 MED ORDER — SODIUM CHLORIDE 0.9% FLUSH
10.0000 mL | INTRAVENOUS | Status: DC | PRN
  Administered 2019-05-07: 08:00:00 10 mL
  Filled 2019-05-07: qty 10

## 2019-05-07 MED ORDER — ATROPINE SULFATE 1 MG/ML IJ SOLN
0.5000 mg | Freq: Once | INTRAMUSCULAR | Status: AC | PRN
  Administered 2019-05-07: 0.5 mg via INTRAVENOUS
  Filled 2019-05-07: qty 1

## 2019-05-07 MED ORDER — DEXTROSE 5 % IV SOLN
Freq: Once | INTRAVENOUS | Status: AC
  Administered 2019-05-07: 09:00:00 via INTRAVENOUS

## 2019-05-07 MED ORDER — SODIUM CHLORIDE 0.9 % IV SOLN
2400.0000 mg/m2 | INTRAVENOUS | Status: DC
  Administered 2019-05-07: 4450 mg via INTRAVENOUS
  Filled 2019-05-07: qty 89

## 2019-05-07 MED ORDER — SODIUM CHLORIDE 0.9 % IV SOLN
376.0000 mg/m2 | Freq: Once | INTRAVENOUS | Status: AC
  Administered 2019-05-07: 700 mg via INTRAVENOUS
  Filled 2019-05-07: qty 35

## 2019-05-07 MED ORDER — OXALIPLATIN CHEMO INJECTION 100 MG/20ML
63.7500 mg/m2 | Freq: Once | INTRAVENOUS | Status: AC
  Administered 2019-05-07: 120 mg via INTRAVENOUS
  Filled 2019-05-07: qty 20

## 2019-05-07 MED ORDER — HEPARIN SOD (PORK) LOCK FLUSH 100 UNIT/ML IV SOLN: 500.0000 [IU] | Freq: Once | INTRAVENOUS | Status: DC | PRN

## 2019-05-07 MED ORDER — PALONOSETRON HCL INJECTION 0.25 MG/5ML
0.2500 mg | Freq: Once | INTRAVENOUS | Status: AC
  Administered 2019-05-07: 0.25 mg via INTRAVENOUS
  Filled 2019-05-07: qty 5

## 2019-05-07 MED ORDER — SODIUM CHLORIDE 0.9 % IV SOLN
150.0000 mg/m2 | Freq: Once | INTRAVENOUS | Status: AC
  Administered 2019-05-07: 280 mg via INTRAVENOUS
  Filled 2019-05-07: qty 10

## 2019-05-07 MED ORDER — SODIUM CHLORIDE 0.9 % IV SOLN
Freq: Once | INTRAVENOUS | Status: AC
  Administered 2019-05-07: 10:00:00 via INTRAVENOUS
  Filled 2019-05-07: qty 5

## 2019-05-07 NOTE — Progress Notes (Signed)
Mark Foster, [place] [address]   CLINIC:  Medical Oncology/Hematology  PCP:  Doree Albee, MD [place] [address] [phone]   REASON FOR VISIT:  Follow-up for pancreatic cancer      BRIEF ONCOLOGIC HISTORY:  Oncology History  Pancreatic adenocarcinoma ([facility])  08/20/2018 Imaging   CT abdomen/pelvis w/ contrast: IMPRESSION: Atrophy and ductal dilatation involving the pancreatic tail, with suspected small soft tissue mass in the pancreatic body which could represent pancreatic carcinoma. Abdomen MRI and MRCP without and with contrast is recommended for further evaluation.  No evidence of hepatobiliary disease.   08/30/2018 Imaging   MRI abdomen w/ contrast: IMPRESSION: 1. Although not definitive, there remains concern of a small hypoenhancing mass at the junction of the pancreatic body and tail associated with atrophy and ductal dilatation in the pancreatic tail. This remains concerning for pancreatic neoplasm. Postinflammatory stricture less likely. Besides a tiny cystic lesion in the pancreatic tail, there are no other signs of previous pancreatitis. Further evaluation with endoscopic ultrasound for possible biopsy strongly recommended. 2. No evidence of metastatic disease. 3. Cholelithiasis without evidence of cholecystitis or biliary dilatation.   09/03/2018 Initial Diagnosis   Pancreatic adenocarcinoma ([facility])   10/03/2018 Procedure   EUS: - 2.6cm irregularly shaped mass in the body of the pancreas that causing main pancreatic duct obstruction and dilation. The mass abuts the splenic vessels but no other significant vascular structures and it was sampled with trangastric EUS FNA. Preliminary cytology is + for malignancy, likely well-differentiated adenocarcinoma. It appears surgically resectable.   10/03/2018 Pathology Results   Accession: [id number]  FINE NEEDLE ASPIRATION, ENDOSCOPIC, PANCREAS BODY  (SPECIMEN 1 OF 1 COLLECTED 10/03/18): ADENOCARCINOMA.   10/17/2018 Imaging   PET: IMPRESSION: 1. Tiny focus of hypermetabolism identified in the body of pancreas, adjacent to the abrupt cut off of the main pancreatic duct. No evidence for hypermetabolic metastatic disease in the neck, chest, abdomen, or pelvis. 2. Cholelithiasis. 3.  Aortic Atherosclerois (ICD10-170.0) 4. 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  The Hereditary Gene Panel offered by [facility] includes sequencing and/or deletion duplication testing of the following 47 genes: APC, ATM, AXIN2, BARD1, BMPR1A, BRCA1, BRCA2, BRIP1, CDH1, CDK4, CDKN2A (p14ARF), CDKN2A (p16INK4a), CHEK2, CTNNA1, DICER1, EPCAM (Deletion/duplication testing only), GREM1 (promoter region deletion/duplication testing only), KIT, MEN1, MLH1, MSH2, MSH3, MSH6, MUTYH, NBN, NF1, NHTL1, PALB2, PDGFRA, PMS2, POLD1, POLE, PTEN, RAD50, RAD51C, RAD51D, SDHB, SDHC, SDHD, SMAD4, SMARCA4. STK11, TP53, TSC1, TSC2, and VHL.  The following genes were evaluated for sequence changes only: SDHA and HOXB13 c.251G>A variant only. The report date is November 12, 2018.   11/20/2018 Surgery   Distal subtotal pancreatectomy with splenectomy at [facility]   11/20/2018 Surgery   TUMOR   Tumor Site:  Pancreatic body    Histologic Type:  Ductal adenocarcinoma    Histologic Grade:  G1: Well differentiated    Tumor Size:  Greatest dimension in Centimeters (cm): 2.8 Centimeters (cm)    Additional Dimension in Centimeters (cm):  2.7 Centimeters (cm)    Additional Dimension in Centimeters (cm):  1.8 Centimeters (cm)   Tumor Extent:      Tumor Extension:  Tumor is confined to pancreas    Accessory Findings:      Treatment Effect:  No known presurgical therapy     Lymphovascular Invasion:  Not identified     Perineural Invasion:  Not identified  MARGINS   Margins:      Proximal  Pancreatic Parenchymal Margin:  Uninvolved by invasive carcinoma and pancreatic high-grade intraepithelial neoplasia      Distance of Invasive Carcinoma from Margin:  0.6 Centimeters (cm)   :      Other Margin:  Splenic margin.      Margin Status:  Uninvolved by invasive carcinoma   LYMPH NODES  Number of Lymph Nodes Involved:  2   Number of Lymph Nodes Examined:  16   PATHOLOGIC STAGE CLASSIFICATION (pTNM, AJCC 8th Edition)  TNM Descriptors:  Not applicable   Primary Tumor (pT):  pT2   Regional Lymph Nodes (pN):  pN1   ADDITIONAL FINDINGS  Additional Pathologic Findings:  Pancreatic intraepithelial neoplasia    Highest Grade (PanIN):  High grade PanIN is present.   Additional Pathologic Findings:  Benign unilocular pancreatic cyst (1.3 cm in greatest dimension) at the pancreatic tail.   12/30/2018 -  Chemotherapy   The patient had palonosetron (ALOXI) injection 0.25 mg, 0.25 mg, Intravenous,  Once, 10 of 12 cycles Administration: 0.25 mg (12/30/2018), 0.25 mg (01/13/2019), 0.25 mg (01/28/2019), 0.25 mg (02/11/2019), 0.25 mg (02/25/2019), 0.25 mg (03/11/2019), 0.25 mg (03/25/2019), 0.25 mg (04/09/2019), 0.25 mg (04/23/2019), 0.25 mg (05/07/2019) pegfilgrastim (NEULASTA) injection 6 mg, 6 mg, Subcutaneous, Once, 7 of 7 cycles Administration: 6 mg (01/01/2019), 6 mg (01/15/2019), 6 mg (01/30/2019), 6 mg (02/13/2019), 6 mg (02/27/2019), 6 mg (03/13/2019), 6 mg (03/27/2019) pegfilgrastim-jmdb (FULPHILA) injection 6 mg, 6 mg, Subcutaneous,  Once, 3 of 5 cycles Administration: 6 mg (04/11/2019), 6 mg (04/25/2019), 6 mg (05/09/2019) irinotecan (CAMPTOSAR) 280 mg in sodium chloride 0.9 % 500 mL chemo infusion, 150 mg/m2 = 280 mg (100 % of original dose 150 mg/m2), Intravenous,  Once, 10 of 12 cycles Dose modification: 150 mg/m2 (original dose 150 mg/m2, Cycle 1, Reason: Provider Judgment) Administration: 280 mg (12/30/2018), 280 mg (01/13/2019), 280 mg (01/28/2019), 280 mg (02/11/2019), 280  mg (02/25/2019), 280 mg (03/11/2019), 280 mg (03/25/2019), 280 mg (04/09/2019), 280 mg (04/23/2019), 280 mg (05/07/2019) leucovorin 700 mg in sodium chloride 0.9 % 250 mL infusion, 744 mg, Intravenous,  Once, 10 of 12 cycles Administration: 700 mg (12/30/2018), 700 mg (01/13/2019), 700 mg (01/28/2019), 700 mg (02/11/2019), 700 mg (02/25/2019), 700 mg (03/11/2019), 700 mg (03/25/2019), 700 mg (04/09/2019), 700 mg (04/23/2019), 700 mg (05/07/2019) oxaliplatin (ELOXATIN) 120 mg in dextrose 5 % 500 mL chemo infusion, 63.75 mg/m2 = 120 mg (75 % of original dose 85 mg/m2), Intravenous,  Once, 10 of 12 cycles Dose modification: 63.75 mg/m2 (75 % of original dose 85 mg/m2, Cycle 1, Reason: Other (see comments), Comment: high risk for worsening neuropathy) Administration: 120 mg (12/30/2018), 120 mg (01/13/2019), 120 mg (01/28/2019), 120 mg (02/11/2019), 120 mg (02/25/2019), 120 mg (03/11/2019), 120 mg (03/25/2019), 120 mg (04/09/2019), 120 mg (04/23/2019), 120 mg (05/07/2019) fosaprepitant (EMEND) 150 mg, dexamethasone (DECADRON) 12 mg in sodium chloride 0.9 % 145 mL IVPB, , Intravenous,  Once, 10 of 12 cycles Administration:  (12/30/2018),  (01/13/2019),  (01/28/2019),  (02/11/2019),  (02/25/2019),  (03/11/2019),  (03/25/2019),  (04/09/2019),  (04/23/2019),  (05/07/2019) fluorouracil (ADRUCIL) 4,450 mg in sodium chloride 0.9 % 61 mL chemo infusion, 2,400 mg/m2 = 4,450 mg, Intravenous, 1 Day/Dose, 10 of 12 cycles Administration: 4,450 mg (12/30/2018), 4,450 mg (01/13/2019), 4,450 mg (01/28/2019), 4,450 mg (02/11/2019), 4,450 mg (02/25/2019), 4,450 mg (03/11/2019), 4,450 mg (03/25/2019), 4,450 mg (04/09/2019), 4,450 mg (04/23/2019), 4,450 mg (05/07/2019)  for chemotherapy treatment.       CANCER STAGING: Cancer  Staging No matching staging information was found for the patient.   INTERVAL HISTORY:  Mark Foster 63 y.o. male seen for follow-up of pancreatic cancer.  Cycle 9 was on 04/23/2019.  He is here for cycle 10.  He reported numbness in the left lower lip for the  last 10 days.  Denies any difficulty chewing food.  Appetite and energy levels are 75%.  Denies any nausea vomiting diarrhea or constipation.  No numbness or tingling in the extremities reported.  He is taking 42 units of Basaglar daily.  Dizziness has improved since he started taking meclizine.  REVIEW OF SYSTEMS:  Review of Systems  Neurological: Positive for numbness.  All other systems reviewed and are negative.    PAST MEDICAL/SURGICAL HISTORY:  Past Medical History:  Diagnosis Date  . Cervical compression fracture ([place])   . Cervical spinal stenosis   . Diabetic neuropathy ([facility])    Bilateral legs  . Diverticulitis   . Family history of pancreatic cancer   . History of kidney stones   . Hypothyroidism   . Pancreatic cancer ([place])   . Stenosis of left vertebral artery   . Stroke [facility])    2011  . Type 2 diabetes mellitus ([place])    Past Surgical History:  Procedure Laterality Date  . APPENDECTOMY    . COLON RESECTION     For diverticulitis  . COLON SURGERY    . ESOPHAGOGASTRODUODENOSCOPY N/A 10/03/2018   Procedure: ESOPHAGOGASTRODUODENOSCOPY (EGD);  Surgeon: [name], MD;  Location: [name];  Service: Endoscopy;  Laterality: N/A;  . EUS N/A 10/03/2018   Procedure: UPPER ENDOSCOPIC ULTRASOUND (EUS) RADIAL;  Surgeon: [name], MD;  Location: [facility];  Service: Endoscopy;  Laterality: N/A;  . FINE NEEDLE ASPIRATION N/A 10/03/2018   Procedure: FINE NEEDLE ASPIRATION (FNA) LINEAR;  Surgeon: [name], MD;  Location: [facility] ENDOSCOPY;  Service: Endoscopy;  Laterality: N/A;  . IR ANGIO INTRA EXTRACRAN SEL COM CAROTID INNOMINATE BILAT MOD SED  01/21/2018  . IR ANGIO INTRA EXTRACRAN SEL COM CAROTID INNOMINATE UNI R MOD SED  03/21/2018  . IR ANGIO VERTEBRAL SEL VERTEBRAL BILAT MOD SED  01/21/2018  . IR TRANSCATH EXCRAN VERT OR CAR A STENT  03/21/2018  . PORTACATH PLACEMENT Left 12/23/2018   Procedure: INSERTION PORT-A-CATH;  Surgeon: Virl Cagey, MD;   Location: [facility];  Service: General;  Laterality: Left;  . RADIOLOGY WITH ANESTHESIA N/A 03/21/2018   Procedure: IR WITH ANESTHESIA WITH STENT PLACEMENT;  Surgeon: Luanne Bras, MD;  Location: [facility];  Service: Radiology;  Laterality: N/A;  . ROTATOR CUFF REPAIR     Left     SOCIAL HISTORY:  Social History   Socioeconomic History  . Marital status: Single    Spouse name: Not on file  . Number of children: 2  . Years of education: Not on file  . Highest education level: Not on file  Occupational History  . Occupation: Estate agent  Social Needs  . Financial resource strain: Somewhat hard  . Food insecurity    Worry: Never true    Inability: Never true  . Transportation needs    Medical: No    Non-medical: No  Tobacco Use  . Smoking status: Never Smoker  . Smokeless tobacco: Never Used  Substance and Sexual Activity  . Alcohol use: Not Currently  . Drug use: Never  . Sexual activity: Not on file  Lifestyle  . Physical activity    Days per week: 0 days  Minutes per session: 0 min  . Stress: To some extent  Relationships  . Social connections    Talks on phone: More than three times a week    Gets together: Once a week    Attends religious service: More than 4 times per year    Active member of club or organization: No    Attends meetings of clubs or organizations: Never    Relationship status: Divorced  . Intimate partner violence    Fear of current or ex partner: No    Emotionally abused: No    Physically abused: No    Forced sexual activity: No  Other Topics Concern  . Not on file  Social History Narrative  . Not on file    FAMILY HISTORY:  Family History  Problem Relation Age of Onset  . Stroke Mother   . Pancreatic cancer Father 56       d. 36  . Stroke Maternal Grandmother   . Heart attack Maternal Grandfather   . Heart Problems Paternal Grandfather   . Scoliosis Daughter   . Muscular dystrophy Grandson     CURRENT MEDICATIONS:   Outpatient Encounter Medications as of 05/07/2019  Medication Sig  . aspirin 81 MG tablet Take 1 tablet (81 mg total) by mouth daily.  Marland Kitchen atorvastatin (LIPITOR) 80 MG tablet Take 80 mg by mouth daily at 6 PM.   . dextrose 5 % SOLN 1,000 mL with fluorouracil 5 GM/100ML SOLN Inject into the vein over 48 hr.  . ECHINACEA EXTRACT PO Take 2 tablets by mouth daily.   Marland Kitchen FLUOROURACIL IV Inject into the vein every 14 (fourteen) days.  Marland Kitchen gabapentin (NEURONTIN) 300 MG capsule Take 1 capsule (300 mg total) by mouth 2 (two) times daily.  Marland Kitchen HYDROcodone-acetaminophen (NORCO/VICODIN) 5-325 MG tablet Take 1 tablet by mouth every 4 (four) hours as needed.  . Insulin Glargine (BASAGLAR KWIKPEN) 100 UNIT/ML SOPN Inject 0.42 mLs (42 Units total) into the skin at bedtime.  . Insulin Pen Needle 32G X 4 MM MISC 1 Device by Does not apply route daily.  . IRINOTECAN HCL IV Inject into the vein every 14 (fourteen) days.  Marland Kitchen LEUCOVORIN CALCIUM IV Inject into the vein every 14 (fourteen) days.  Marland Kitchen lidocaine-prilocaine (EMLA) cream Apply pea-sized amount to port-a-cath site and cover with plastic wrap one hour prior to chemotherapy appointments  . loperamide (IMODIUM A-D) 2 MG tablet Take 1 tablet (2 mg total) by mouth as needed. Take 2 at diarrhea onset , then 1 every 2hr until 12hrs with no BM. May take 2 every 4hrs at night. If diarrhea recurs repeat.  . meclizine (ANTIVERT) 25 MG tablet Take 1 tablet (25 mg total) by mouth 3 (three) times daily as needed for dizziness.  . NP THYROID 30 MG tablet Take 30 mg by mouth daily before breakfast.   . Omega-3 1000 MG CAPS Take 1,000 mg by mouth daily.  . ondansetron (ZOFRAN) 8 MG tablet Take 1 tablet (8 mg total) by mouth every 6 (six) hours as needed for nausea or vomiting.  . OXALIPLATIN IV Inject into the vein every 14 (fourteen) days.  . pantoprazole (PROTONIX) 20 MG tablet Take 20 mg by mouth daily.   . prochlorperazine (COMPAZINE) 10 MG tablet Take 1 tablet (10 mg total) by  mouth every 6 (six) hours as needed (NAUSEA).  Marland Kitchen sertraline (ZOLOFT) 50 MG tablet Take 50 mg by mouth every morning.   . tamsulosin (FLOMAX) 0.4 MG CAPS capsule Take 0.4  mg by mouth daily.   . ticagrelor (BRILINTA) 90 MG TABS tablet Take 1 tablet (90 mg total) by mouth 2 (two) times daily.  . traMADol (ULTRAM) 50 MG tablet   . vitamin B-12 (CYANOCOBALAMIN) 1000 MCG tablet Take 1,000 mcg by mouth daily.   Facility-Administered Encounter Medications as of 05/07/2019  Medication  . 0.9 %  sodium chloride infusion  . 0.9 %  sodium chloride infusion  . dextrose 5 % solution  . sodium chloride flush (NS) 0.9 % injection 10 mL    ALLERGIES:  Allergies  Allergen Reactions  . Demerol [Meperidine Hcl] Other (See Comments)    convulsions     PHYSICAL EXAM:  ECOG Performance status: 1  There were no vitals filed for this visit. There were no vitals filed for this visit.  Physical Exam Vitals signs reviewed.  Constitutional:      Appearance: Normal appearance.  Cardiovascular:     Rate and Rhythm: Normal rate and regular rhythm.     Heart sounds: Normal heart sounds.  Pulmonary:     Effort: Pulmonary effort is normal.     Breath sounds: Normal breath sounds.  Abdominal:     General: There is no distension.     Palpations: Abdomen is soft. There is no mass.  Musculoskeletal:        General: No swelling.  Skin:    General: Skin is warm.  Neurological:     General: No focal deficit present.     Mental Status: He is alert and oriented to person, place, and time.  Psychiatric:        Mood and Affect: Mood normal.        Behavior: Behavior normal.    Left ankle in a boot.  LABORATORY DATA:  I have reviewed the labs as listed.  CBC    Component Value Date/Time   WBC 12.2 (H) 05/07/2019 0817   RBC 4.22 05/07/2019 0817   HGB 13.4 05/07/2019 0817   HGB 14.9 12/11/2018 1018   HCT 41.1 05/07/2019 0817   PLT 290 05/07/2019 0817   PLT 335 12/11/2018 1018   MCV 97.4 05/07/2019  0817   MCH 31.8 05/07/2019 0817   MCHC 32.6 05/07/2019 0817   RDW 18.0 (H) 05/07/2019 0817   LYMPHSABS 3.1 05/07/2019 0817   MONOABS 1.8 (H) 05/07/2019 0817   EOSABS 0.3 05/07/2019 0817   BASOSABS 0.1 05/07/2019 0817   CMP Latest Ref Rng & Units 05/07/2019 04/23/2019 04/09/2019  Glucose 70 - 99 mg/dL 214(H) 231(H) 220(H)  BUN 8 - 23 mg/dL _0 Creatinine 0.61 - 1.24 mg/dL 0.54(L) 0.51(L) 0.59(L)  Sodium 135 - 145 mmol/L 139 141 140  Potassium 3.5 - 5.1 mmol/L 4.0 3.9 3.9  Chloride 98 - 111 mmol/L 108 107 106  CO2 22 - 32 mmol/L _1 Calcium 8.9 - 10.3 mg/dL 9.0 9.0 9.1  Total Protein 6.5 - 8.1 g/dL 6.8 6.8 6.9  Total Bilirubin 0.3 - 1.2 mg/dL 0.4 0.5 0.6  Alkaline Phos 38 - 126 U/L 157(H) 175(H) 183(H)  AST 15 - 41 U/L _2 ALT 0 - 44 U/L _3 DIAGNOSTIC IMAGING:  I have independently reviewed the scans and discussed with the patient.      ASSESSMENT & PLAN:   Pancreatic adenocarcinoma ([facility]) 1.  Stage IIb (T2N1) pancreatic adenocarcinoma: -Status post distal pancreatectomy and splenectomy at [facility] by Dr. Zenia Resides on 11/20/2018. 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 - 9 cycles of FOLFIRINOX from 12/30/2018 through 04/23/2019. - His vertigo has improved when he takes meclizine. - He reported numbness in the left lower lip for the last 10 days.  Denies any other numbness.  Denies any difficulty chewing food. - I have recommended MRI of the brain.  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 -We reviewed his labs.  He will proceed with cycle 10 without any dose modifications.  His oxaliplatin is already dose reduced. -I will see him back in 2 weeks for follow-up.   3.  Post splenectomy state: -He received vaccinations prior to splenectomy.  We will follow-up on booster schedule.  3.  Diabetes: - He is continuing Basaglar 42 units daily.  This is managed by Dr. Dorris Fetch.  4.  Neuropathy: -He has questionable signs of neuropathy  from his diabetes of long duration.  Denies any tingling or numbness next images. -We have cut back on his oxaliplatin dose by 25% from cycle 1.   Total time spent is 25 minutes with more than 50% of the time spent face-to-face discussing and reinforcing treatment plan and coordination of care.  Orders placed this encounter:  Orders Placed This Encounter  Procedures  . MR [place], [place] [phone]

## 2019-05-07 NOTE — Progress Notes (Signed)
Labs reviewed with MD today at office visit. Proceed with treatment today.   Treatment given per orders. Patient tolerated it well without problems. Vitals stable and discharged home from clinic ambulatory. Follow up as scheduled.  

## 2019-05-07 NOTE — Patient Instructions (Signed)
[facility] Discharge Instructions for Patients Receiving Chemotherapy  Today you received the following chemotherapy agents   To help prevent nausea and vomiting after your treatment, we encourage you to take your nausea medication   If you develop nausea and vomiting that is not controlled by your nausea medication, call the clinic.   BELOW ARE SYMPTOMS THAT SHOULD BE REPORTED IMMEDIATELY:  *FEVER GREATER THAN 100.5 F  *CHILLS WITH OR WITHOUT FEVER  NAUSEA AND VOMITING THAT IS NOT CONTROLLED WITH YOUR NAUSEA MEDICATION  *UNUSUAL SHORTNESS OF BREATH  *UNUSUAL BRUISING OR BLEEDING  TENDERNESS IN MOUTH AND THROAT WITH OR WITHOUT PRESENCE OF ULCERS  *URINARY PROBLEMS  *BOWEL PROBLEMS  UNUSUAL RASH Items with * indicate a potential emergency and should be followed up as soon as possible.  Feel free to call the clinic should you have any questions or concerns. The clinic phone number is [phone].  Please show the CHEMO ALERT CARD at check-in to the Emergency Department and triage nurse.   

## 2019-05-07 NOTE — Patient Instructions (Addendum)
[place] at [facility] Discharge Instructions  You were seen today by Dr. Delton Coombes. He went over your recent lab results. He will get you scheduled for a MRI of your brain to evaluate the lip numbness. He will see you back in 2 weeks for labs, treatment and follow up.   Thank you for choosing [place] at [facility] to provide your oncology and hematology care.  To afford each patient quality time with our provider, please arrive at least 15 minutes before your scheduled appointment time.   If you have a lab appointment with the [facility] please come in thru the  Main Entrance and check in at the main information desk  You need to re-schedule your appointment should you arrive 10 or more minutes late.  We strive to give you quality time with our providers, and arriving late affects you and other patients whose appointments are after yours.  Also, if you no show three or more times for appointments you may be dismissed from the clinic at the providers discretion.     Again, thank you for choosing [facility].  Our hope is that these requests will decrease the amount of time that you wait before being seen by our physicians.       _____________________________________________________________  Should you have questions after your visit to [facility], please contact our office at [phone] between the hours of 8:00 a.m. and 4:30 p.m.  Voicemails left after 4:00 p.m. will not be returned until the following business day.  For prescription refill requests, have your pharmacy contact our office and allow 72 hours.    Cancer Center Support Programs:   > Cancer Support Group  2nd Tuesday of the month 1pm-2pm, Journey Room

## 2019-05-09 ENCOUNTER — Other Ambulatory Visit: Payer: Self-pay

## 2019-05-09 ENCOUNTER — Encounter (HOSPITAL_COMMUNITY): Payer: Self-pay

## 2019-05-09 ENCOUNTER — Inpatient Hospital Stay (HOSPITAL_COMMUNITY): Payer: PRIVATE HEALTH INSURANCE

## 2019-05-09 VITALS — Temp 97.7°F | Resp 18

## 2019-05-09 DIAGNOSIS — Z95828 Presence of other vascular implants and grafts: Secondary | ICD-10-CM

## 2019-05-09 DIAGNOSIS — Z5111 Encounter for antineoplastic chemotherapy: Secondary | ICD-10-CM | POA: Diagnosis not present

## 2019-05-09 DIAGNOSIS — C259 Malignant neoplasm of pancreas, unspecified: Secondary | ICD-10-CM

## 2019-05-09 MED ORDER — HEPARIN SOD (PORK) LOCK FLUSH 100 UNIT/ML IV SOLN
500.0000 [IU] | Freq: Once | INTRAVENOUS | Status: AC | PRN
  Administered 2019-05-09: 500 [IU]

## 2019-05-09 MED ORDER — SODIUM CHLORIDE 0.9% FLUSH
10.0000 mL | INTRAVENOUS | Status: DC | PRN
  Administered 2019-05-09: 10 mL
  Filled 2019-05-09: qty 10

## 2019-05-09 MED ORDER — PEGFILGRASTIM-JMDB 6 MG/0.6ML ~~LOC~~ SOSY
6.0000 mg | PREFILLED_SYRINGE | Freq: Once | SUBCUTANEOUS | Status: AC
  Administered 2019-05-09: 6 mg via SUBCUTANEOUS
  Filled 2019-05-09: qty 0.6

## 2019-05-09 NOTE — Progress Notes (Signed)
Patient tolerated injection with no complaints voiced.  Site clean and dry with no bruising or swelling noted at site.  Band aid applied.   Patients port flushed without difficulty.  Good blood return noted with no bruising or swelling noted at site.  Band aid applied.  VSS with discharge and left ambulatory with no s/s of distress noted.

## 2019-05-10 NOTE — Assessment & Plan Note (Signed)
1.  Stage IIb (T2N1) pancreatic adenocarcinoma: -Status post distal pancreatectomy and splenectomy at [facility] by Dr. Zenia Resides on 11/20/2018. 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 - 9 cycles of FOLFIRINOX from 12/30/2018 through 04/23/2019. - His vertigo has improved when he takes meclizine. - He reported numbness in the left lower lip for the last 10 days.  Denies any other numbness.  Denies any difficulty chewing food. - I have recommended MRI of the brain.  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 -We reviewed his labs.  He will proceed with cycle 10 without any dose modifications.  His oxaliplatin is already dose reduced. -I will see him back in 2 weeks for follow-up.   3.  Post splenectomy state: -He received vaccinations prior to splenectomy.  We will follow-up on booster schedule.  3.  Diabetes: - He is continuing Basaglar 42 units daily.  This is managed by Dr. Dorris Fetch.  4.  Neuropathy: -He has questionable signs of neuropathy from his diabetes of long duration.  Denies any tingling or numbness next images. -We have cut back on his oxaliplatin dose by 25% from cycle 1.

## 2019-05-14 ENCOUNTER — Ambulatory Visit (HOSPITAL_COMMUNITY): Payer: PRIVATE HEALTH INSURANCE

## 2019-05-14 ENCOUNTER — Other Ambulatory Visit (HOSPITAL_COMMUNITY): Payer: Self-pay | Admitting: *Deleted

## 2019-05-14 DIAGNOSIS — R200 Anesthesia of skin: Secondary | ICD-10-CM

## 2019-05-15 ENCOUNTER — Other Ambulatory Visit (INDEPENDENT_AMBULATORY_CARE_PROVIDER_SITE_OTHER): Payer: Self-pay | Admitting: Internal Medicine

## 2019-05-15 ENCOUNTER — Ambulatory Visit (HOSPITAL_COMMUNITY): Payer: PRIVATE HEALTH INSURANCE | Admitting: Hematology

## 2019-05-15 MED ORDER — ATORVASTATIN CALCIUM 80 MG PO TABS: 80.0000 mg | ORAL_TABLET | Freq: Every day | ORAL | 0 refills | Status: DC

## 2019-05-19 ENCOUNTER — Other Ambulatory Visit: Payer: Self-pay

## 2019-05-19 ENCOUNTER — Ambulatory Visit (HOSPITAL_COMMUNITY)
Admission: RE | Admit: 2019-05-19 | Discharge: 2019-05-19 | Disposition: A | Discharge status: Home / Self Care | Payer: PRIVATE HEALTH INSURANCE | Source: Ambulatory Visit | Attending: Hematology | Admitting: Hematology

## 2019-05-19 DIAGNOSIS — R200 Anesthesia of skin: Secondary | ICD-10-CM | POA: Diagnosis not present

## 2019-05-19 MED ORDER — GADOBUTROL 1 MMOL/ML IV SOLN
7.0000 mL | Freq: Once | INTRAVENOUS | Status: AC | PRN
  Administered 2019-05-19: 7 mL via INTRAVENOUS

## 2019-05-20 ENCOUNTER — Inpatient Hospital Stay (HOSPITAL_COMMUNITY): Payer: PRIVATE HEALTH INSURANCE | Admitting: Hematology

## 2019-05-21 ENCOUNTER — Inpatient Hospital Stay (HOSPITAL_COMMUNITY): Payer: PRIVATE HEALTH INSURANCE

## 2019-05-21 ENCOUNTER — Encounter (HOSPITAL_COMMUNITY): Payer: Self-pay

## 2019-05-21 ENCOUNTER — Inpatient Hospital Stay (HOSPITAL_BASED_OUTPATIENT_CLINIC_OR_DEPARTMENT_OTHER): Payer: PRIVATE HEALTH INSURANCE | Admitting: Hematology

## 2019-05-21 ENCOUNTER — Encounter (HOSPITAL_COMMUNITY): Payer: Self-pay | Admitting: Hematology

## 2019-05-21 ENCOUNTER — Other Ambulatory Visit: Payer: Self-pay

## 2019-05-21 VITALS — BP 113/58 | HR 79 | Temp 97.3°F | Resp 18 | Wt 156.0 lb

## 2019-05-21 DIAGNOSIS — Z95828 Presence of other vascular implants and grafts: Secondary | ICD-10-CM

## 2019-05-21 DIAGNOSIS — C259 Malignant neoplasm of pancreas, unspecified: Secondary | ICD-10-CM | POA: Diagnosis not present

## 2019-05-21 DIAGNOSIS — Z5111 Encounter for antineoplastic chemotherapy: Secondary | ICD-10-CM | POA: Diagnosis not present

## 2019-05-21 LAB — CBC WITH DIFFERENTIAL/PLATELET
Abs Immature Granulocytes: 0.1 10*3/uL — ABNORMAL HIGH (ref 0.00–0.07)
Basophils Absolute: 0.1 10*3/uL (ref 0.0–0.1)
Basophils Relative: 1 %
Eosinophils Absolute: 0.3 10*3/uL (ref 0.0–0.5)
Eosinophils Relative: 2 %
HCT: 41 % (ref 39.0–52.0)
Hemoglobin: 13.4 g/dL (ref 13.0–17.0)
Immature Granulocytes: 1 %
Lymphocytes Relative: 17 %
Lymphs Abs: 2.2 10*3/uL (ref 0.7–4.0)
MCH: 31.8 pg (ref 26.0–34.0)
MCHC: 32.7 g/dL (ref 30.0–36.0)
MCV: 97.2 fL (ref 80.0–100.0)
Monocytes Absolute: 1.7 10*3/uL — ABNORMAL HIGH (ref 0.1–1.0)
Monocytes Relative: 13 %
Neutro Abs: 8.4 10*3/uL — ABNORMAL HIGH (ref 1.7–7.7)
Neutrophils Relative %: 66 %
Platelets: 289 10*3/uL (ref 150–400)
RBC: 4.22 MIL/uL (ref 4.22–5.81)
RDW: 18.2 % — ABNORMAL HIGH (ref 11.5–15.5)
WBC: 12.7 10*3/uL — ABNORMAL HIGH (ref 4.0–10.5)
nRBC: 0 % (ref 0.0–0.2)

## 2019-05-21 LAB — COMPREHENSIVE METABOLIC PANEL
ALT: 21 U/L (ref 0–44)
AST: 20 U/L (ref 15–41)
Albumin: 3.8 g/dL (ref 3.5–5.0)
Alkaline Phosphatase: 166 U/L — ABNORMAL HIGH (ref 38–126)
Anion gap: 9 (ref 5–15)
BUN: 21 mg/dL (ref 8–23)
CO2: 23 mmol/L (ref 22–32)
Calcium: 8.8 mg/dL — ABNORMAL LOW (ref 8.9–10.3)
Chloride: 107 mmol/L (ref 98–111)
Creatinine, Ser: 0.56 mg/dL — ABNORMAL LOW (ref 0.61–1.24)
GFR calc Af Amer: >60 mL/min (ref >60)
GFR calc non Af Amer: >60 mL/min (ref >60)
Glucose, Bld: 159 mg/dL — ABNORMAL HIGH (ref 70–99)
Potassium: 4 mmol/L (ref 3.5–5.1)
Sodium: 139 mmol/L (ref 135–145)
Total Bilirubin: 0.6 mg/dL (ref 0.3–1.2)
Total Protein: 6.6 g/dL (ref 6.5–8.1)

## 2019-05-21 MED ORDER — ATROPINE SULFATE 1 MG/ML IJ SOLN
0.5000 mg | Freq: Once | INTRAMUSCULAR | Status: AC | PRN
  Administered 2019-05-21: 0.5 mg via INTRAVENOUS

## 2019-05-21 MED ORDER — SODIUM CHLORIDE 0.9 % IV SOLN
Freq: Once | INTRAVENOUS | Status: AC
  Administered 2019-05-21: 11:00:00 via INTRAVENOUS
  Filled 2019-05-21: qty 5

## 2019-05-21 MED ORDER — SODIUM CHLORIDE 0.9% FLUSH
10.0000 mL | INTRAVENOUS | Status: DC | PRN
  Administered 2019-05-21: 10 mL
  Filled 2019-05-21: qty 10

## 2019-05-21 MED ORDER — SODIUM CHLORIDE 0.9 % IV SOLN
2400.0000 mg/m2 | INTRAVENOUS | Status: AC
  Administered 2019-05-21: 4450 mg via INTRAVENOUS
  Filled 2019-05-21: qty 89

## 2019-05-21 MED ORDER — ATROPINE SULFATE 1 MG/ML IJ SOLN
INTRAMUSCULAR | Status: AC
  Filled 2019-05-21: qty 1

## 2019-05-21 MED ORDER — SODIUM CHLORIDE 0.9 % IV SOLN
150.0000 mg/m2 | Freq: Once | INTRAVENOUS | Status: AC
  Administered 2019-05-21: 14:00:00 280 mg via INTRAVENOUS
  Filled 2019-05-21: qty 4

## 2019-05-21 MED ORDER — PALONOSETRON HCL INJECTION 0.25 MG/5ML
INTRAVENOUS | Status: AC
  Filled 2019-05-21: qty 5

## 2019-05-21 MED ORDER — PALONOSETRON HCL INJECTION 0.25 MG/5ML
0.2500 mg | Freq: Once | INTRAVENOUS | Status: AC
  Administered 2019-05-21: 0.25 mg via INTRAVENOUS

## 2019-05-21 MED ORDER — DEXTROSE 5 % IV SOLN
Freq: Once | INTRAVENOUS | Status: AC
  Administered 2019-05-21: 10:00:00 via INTRAVENOUS

## 2019-05-21 MED ORDER — OXALIPLATIN CHEMO INJECTION 100 MG/20ML
42.5000 mg/m2 | Freq: Once | INTRAVENOUS | Status: AC
  Administered 2019-05-21: 80 mg via INTRAVENOUS
  Filled 2019-05-21: qty 16

## 2019-05-21 MED ORDER — SODIUM CHLORIDE 0.9 % IV SOLN
376.0000 mg/m2 | Freq: Once | INTRAVENOUS | Status: AC
  Administered 2019-05-21: 700 mg via INTRAVENOUS
  Filled 2019-05-21: qty 35

## 2019-05-21 MED ORDER — HEPARIN SOD (PORK) LOCK FLUSH 100 UNIT/ML IV SOLN: 500.0000 [IU] | Freq: Once | INTRAVENOUS | Status: DC | PRN

## 2019-05-21 NOTE — Progress Notes (Signed)
[place], [place] [address]   CLINIC:  Medical Oncology/Hematology  PCP:  Doree Albee, MD [place] [address] [phone]   REASON FOR VISIT:  Follow-up for pancreatic cancer      BRIEF ONCOLOGIC HISTORY:  Oncology History  Pancreatic adenocarcinoma ([place])  08/20/2018 Imaging   CT abdomen/pelvis w/ contrast: IMPRESSION: Atrophy and ductal dilatation involving the pancreatic tail, with suspected small soft tissue mass in the pancreatic body which could represent pancreatic carcinoma. Abdomen MRI and MRCP without and with contrast is recommended for further evaluation.  No evidence of hepatobiliary disease.   08/30/2018 Imaging   MRI abdomen w/ contrast: IMPRESSION: 1. Although not definitive, there remains concern of a small hypoenhancing mass at the junction of the pancreatic body and tail associated with atrophy and ductal dilatation in the pancreatic tail. This remains concerning for pancreatic neoplasm. Postinflammatory stricture less likely. Besides a tiny cystic lesion in the pancreatic tail, there are no other signs of previous pancreatitis. Further evaluation with endoscopic ultrasound for possible biopsy strongly recommended. 2. No evidence of metastatic disease. 3. Cholelithiasis without evidence of cholecystitis or biliary dilatation.   09/03/2018 Initial Diagnosis   Pancreatic adenocarcinoma ([place])   10/03/2018 Procedure   EUS: - 2.6cm irregularly shaped mass in the body of the pancreas that causing main pancreatic duct obstruction and dilation. The mass abuts the splenic vessels but no other significant vascular structures and it was sampled with trangastric EUS FNA. Preliminary cytology is + for malignancy, likely well-differentiated adenocarcinoma. It appears surgically resectable.   10/03/2018 Pathology Results   Accession: [id number]  FINE NEEDLE ASPIRATION, ENDOSCOPIC, PANCREAS BODY  (SPECIMEN 1 OF 1 COLLECTED 10/03/18): ADENOCARCINOMA.   10/17/2018 Imaging   PET: IMPRESSION: 1. Tiny focus of hypermetabolism identified in the body of pancreas, adjacent to the abrupt cut off of the main pancreatic duct. No evidence for hypermetabolic metastatic disease in the neck, chest, abdomen, or pelvis. 2. Cholelithiasis. 3.  Aortic Atherosclerois (ICD10-170.0) 4. 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  The Hereditary Gene Panel offered by [facility] includes sequencing and/or deletion duplication testing of the following 47 genes: APC, ATM, AXIN2, BARD1, BMPR1A, BRCA1, BRCA2, BRIP1, CDH1, CDK4, CDKN2A (p14ARF), CDKN2A (p16INK4a), CHEK2, CTNNA1, DICER1, EPCAM (Deletion/duplication testing only), GREM1 (promoter region deletion/duplication testing only), KIT, MEN1, MLH1, MSH2, MSH3, MSH6, MUTYH, NBN, NF1, NHTL1, PALB2, PDGFRA, PMS2, POLD1, POLE, PTEN, RAD50, RAD51C, RAD51D, SDHB, SDHC, SDHD, SMAD4, SMARCA4. STK11, TP53, TSC1, TSC2, and VHL.  The following genes were evaluated for sequence changes only: SDHA and HOXB13 c.251G>A variant only. The report date is November 12, 2018.   11/20/2018 Surgery   Distal subtotal pancreatectomy with splenectomy at [facility]   11/20/2018 Surgery   TUMOR   Tumor Site:  Pancreatic body    Histologic Type:  Ductal adenocarcinoma    Histologic Grade:  G1: Well differentiated    Tumor Size:  Greatest dimension in Centimeters (cm): 2.8 Centimeters (cm)    Additional Dimension in Centimeters (cm):  2.7 Centimeters (cm)    Additional Dimension in Centimeters (cm):  1.8 Centimeters (cm)   Tumor Extent:      Tumor Extension:  Tumor is confined to pancreas    Accessory Findings:      Treatment Effect:  No known presurgical therapy     Lymphovascular Invasion:  Not identified     Perineural Invasion:  Not identified  MARGINS   Margins:      Proximal  Pancreatic Parenchymal Margin:  Uninvolved by invasive carcinoma and pancreatic high-grade intraepithelial neoplasia      Distance of Invasive Carcinoma from Margin:  0.6 Centimeters (cm)   :      Other Margin:  Splenic margin.      Margin Status:  Uninvolved by invasive carcinoma   LYMPH NODES  Number of Lymph Nodes Involved:  2   Number of Lymph Nodes Examined:  16   PATHOLOGIC STAGE CLASSIFICATION (pTNM, AJCC 8th Edition)  TNM Descriptors:  Not applicable   Primary Tumor (pT):  pT2   Regional Lymph Nodes (pN):  pN1   ADDITIONAL FINDINGS  Additional Pathologic Findings:  Pancreatic intraepithelial neoplasia    Highest Grade (PanIN):  High grade PanIN is present.   Additional Pathologic Findings:  Benign unilocular pancreatic cyst (1.3 cm in greatest dimension) at the pancreatic tail.   12/30/2018 -  Chemotherapy   The patient had palonosetron (ALOXI) injection 0.25 mg, 0.25 mg, Intravenous,  Once, 11 of 12 cycles Administration: 0.25 mg (12/30/2018), 0.25 mg (01/13/2019), 0.25 mg (01/28/2019), 0.25 mg (02/11/2019), 0.25 mg (02/25/2019), 0.25 mg (03/11/2019), 0.25 mg (03/25/2019), 0.25 mg (04/09/2019), 0.25 mg (04/23/2019), 0.25 mg (05/07/2019) pegfilgrastim (NEULASTA) injection 6 mg, 6 mg, Subcutaneous, Once, 7 of 7 cycles Administration: 6 mg (01/01/2019), 6 mg (01/15/2019), 6 mg (01/30/2019), 6 mg (02/13/2019), 6 mg (02/27/2019), 6 mg (03/13/2019), 6 mg (03/27/2019) pegfilgrastim-jmdb (FULPHILA) injection 6 mg, 6 mg, Subcutaneous,  Once, 4 of 5 cycles Administration: 6 mg (04/11/2019), 6 mg (04/25/2019), 6 mg (05/09/2019) irinotecan (CAMPTOSAR) 280 mg in sodium chloride 0.9 % 500 mL chemo infusion, 150 mg/m2 = 280 mg (100 % of original dose 150 mg/m2), Intravenous,  Once, 11 of 12 cycles Dose modification: 150 mg/m2 (original dose 150 mg/m2, Cycle 1, Reason: Provider Judgment) Administration: 280 mg (12/30/2018), 280 mg (01/13/2019), 280 mg (01/28/2019), 280 mg (02/11/2019), 280  mg (02/25/2019), 280 mg (03/11/2019), 280 mg (03/25/2019), 280 mg (04/09/2019), 280 mg (04/23/2019), 280 mg (05/07/2019) leucovorin 700 mg in sodium chloride 0.9 % 250 mL infusion, 744 mg, Intravenous,  Once, 11 of 12 cycles Administration: 700 mg (12/30/2018), 700 mg (01/13/2019), 700 mg (01/28/2019), 700 mg (02/11/2019), 700 mg (02/25/2019), 700 mg (03/11/2019), 700 mg (03/25/2019), 700 mg (04/09/2019), 700 mg (04/23/2019), 700 mg (05/07/2019) oxaliplatin (ELOXATIN) 120 mg in dextrose 5 % 500 mL chemo infusion, 63.75 mg/m2 = 120 mg (75 % of original dose 85 mg/m2), Intravenous,  Once, 11 of 12 cycles Dose modification: 63.75 mg/m2 (75 % of original dose 85 mg/m2, Cycle 1, Reason: Other (see comments), Comment: high risk for worsening neuropathy), 42.5 mg/m2 (50 % of original dose 85 mg/m2, Cycle 11, Reason: Other (see comments), Comment: neuropathy) Administration: 120 mg (12/30/2018), 120 mg (01/13/2019), 120 mg (01/28/2019), 120 mg (02/11/2019), 120 mg (02/25/2019), 120 mg (03/11/2019), 120 mg (03/25/2019), 120 mg (04/09/2019), 120 mg (04/23/2019), 120 mg (05/07/2019) fosaprepitant (EMEND) 150 mg, dexamethasone (DECADRON) 12 mg in sodium chloride 0.9 % 145 mL IVPB, , Intravenous,  Once, 11 of 12 cycles Administration:  (12/30/2018),  (01/13/2019),  (01/28/2019),  (02/11/2019),  (02/25/2019),  (03/11/2019),  (03/25/2019),  (04/09/2019),  (04/23/2019),  (05/07/2019) fluorouracil (ADRUCIL) 4,450 mg in sodium chloride 0.9 % 61 mL chemo infusion, 2,400 mg/m2 = 4,450 mg, Intravenous, 1 Day/Dose, 11 of 12 cycles Administration: 4,450 mg (12/30/2018), 4,450 mg (01/13/2019), 4,450 mg (01/28/2019), 4,450 mg (02/11/2019), 4,450 mg (02/25/2019), 4,450 mg (03/11/2019), 4,450 mg (03/25/2019), 4,450 mg (04/09/2019), 4,450 mg (  04/23/2019), 4,450 mg (05/07/2019)  for chemotherapy treatment.       CANCER STAGING: Cancer Staging No matching staging information was found for the patient.   INTERVAL HISTORY:  Mr. Wyne 63 y.o. male seen for follow-up of pancreatic cancer and  adjuvant chemotherapy.  He received cycle 10 about 2 weeks ago.  He denied any major GI problems including nausea vomiting or diarrhea.  He had mild constipation.  Appetite is 75%.  Energy levels are 50%.  He reported new numbness in the balls of his toes for the last 1 to 2 weeks.  He also reported continuation of the numbness in the left half of the lower lip.  This is not affecting his ability to speak or chew food and swallow.  REVIEW OF SYSTEMS:  Review of Systems  Gastrointestinal: Positive for constipation.  Neurological: Positive for numbness.  All other systems reviewed and are negative.    PAST MEDICAL/SURGICAL HISTORY:  Past Medical History:  Diagnosis Date  . Cervical compression fracture ([place])   . Cervical spinal stenosis   . Diabetic neuropathy ([facility])    Bilateral legs  . Diverticulitis   . Family history of pancreatic cancer   . History of kidney stones   . Hypothyroidism   . Pancreatic cancer ([place])   . Stenosis of left vertebral artery   . Stroke [facility])    2011  . Type 2 diabetes mellitus ([place])    Past Surgical History:  Procedure Laterality Date  . APPENDECTOMY    . COLON RESECTION     For diverticulitis  . COLON SURGERY    . ESOPHAGOGASTRODUODENOSCOPY N/A 10/03/2018   Procedure: ESOPHAGOGASTRODUODENOSCOPY (EGD);  Surgeon: [name], MD;  Location: [name];  Service: Endoscopy;  Laterality: N/A;  . EUS N/A 10/03/2018   Procedure: UPPER ENDOSCOPIC ULTRASOUND (EUS) RADIAL;  Surgeon: [name], MD;  Location: [facility];  Service: Endoscopy;  Laterality: N/A;  . FINE NEEDLE ASPIRATION N/A 10/03/2018   Procedure: FINE NEEDLE ASPIRATION (FNA) LINEAR;  Surgeon: [name], MD;  Location: [facility];  Service: Endoscopy;  Laterality: N/A;  . IR ANGIO INTRA EXTRACRAN SEL COM CAROTID INNOMINATE BILAT MOD SED  01/21/2018  . IR ANGIO INTRA EXTRACRAN SEL COM CAROTID INNOMINATE UNI R MOD SED  03/21/2018  . IR ANGIO VERTEBRAL SEL VERTEBRAL BILAT MOD  SED  01/21/2018  . IR TRANSCATH EXCRAN VERT OR CAR A STENT  03/21/2018  . PORTACATH PLACEMENT Left 12/23/2018   Procedure: INSERTION PORT-A-CATH;  Surgeon: Virl Cagey, MD;  Location: [facility];  Service: General;  Laterality: Left;  . RADIOLOGY WITH ANESTHESIA N/A 03/21/2018   Procedure: IR WITH ANESTHESIA WITH STENT PLACEMENT;  Surgeon: Luanne Bras, MD;  Location: [facility];  Service: Radiology;  Laterality: N/A;  . ROTATOR CUFF REPAIR     Left     SOCIAL HISTORY:  Social History   Socioeconomic History  . Marital status: Single    Spouse name: Not on file  . Number of children: 2  . Years of education: Not on file  . Highest education level: Not on file  Occupational History  . Occupation: Estate agent  Social Needs  . Financial resource strain: Somewhat hard  . Food insecurity    Worry: Never true    Inability: Never true  . Transportation needs    Medical: No    Non-medical: No  Tobacco Use  . Smoking status: Never Smoker  . Smokeless tobacco: Never Used  Substance and Sexual  Activity  . Alcohol use: Not Currently  . Drug use: Never  . Sexual activity: Not on file  Lifestyle  . Physical activity    Days per week: 0 days    Minutes per session: 0 min  . Stress: To some extent  Relationships  . Social connections    Talks on phone: More than three times a week    Gets together: Once a week    Attends religious service: More than 4 times per year    Active member of club or organization: No    Attends meetings of clubs or organizations: Never    Relationship status: Divorced  . Intimate partner violence    Fear of current or ex partner: No    Emotionally abused: No    Physically abused: No    Forced sexual activity: No  Other Topics Concern  . Not on file  Social History Narrative  . Not on file    FAMILY HISTORY:  Family History  Problem Relation Age of Onset  . Stroke Mother   . Pancreatic cancer Father 72       d. 57  . Stroke Maternal  Grandmother   . Heart attack Maternal Grandfather   . Heart Problems Paternal Grandfather   . Scoliosis Daughter   . Muscular dystrophy Grandson     CURRENT MEDICATIONS:  Outpatient Encounter Medications as of 05/21/2019  Medication Sig  . aspirin 81 MG tablet Take 1 tablet (81 mg total) by mouth daily.  Marland Kitchen atorvastatin (LIPITOR) 80 MG tablet Take 1 tablet (80 mg total) by mouth daily at 6 PM.  . dextrose 5 % SOLN 1,000 mL with fluorouracil 5 GM/100ML SOLN Inject into the vein over 48 hr.  . ECHINACEA EXTRACT PO Take 2 tablets by mouth daily.   Marland Kitchen FLUOROURACIL IV Inject into the vein every 14 (fourteen) days.  Marland Kitchen gabapentin (NEURONTIN) 300 MG capsule Take 1 capsule (300 mg total) by mouth 2 (two) times daily.  Marland Kitchen HYDROcodone-acetaminophen (NORCO/VICODIN) 5-325 MG tablet Take 1 tablet by mouth every 4 (four) hours as needed.  . Insulin Glargine (BASAGLAR KWIKPEN) 100 UNIT/ML SOPN Inject 0.42 mLs (42 Units total) into the skin at bedtime.  . Insulin Pen Needle 32G X 4 MM MISC 1 Device by Does not apply route daily.  . IRINOTECAN HCL IV Inject into the vein every 14 (fourteen) days.  Marland Kitchen LEUCOVORIN CALCIUM IV Inject into the vein every 14 (fourteen) days.  Marland Kitchen lidocaine-prilocaine (EMLA) cream Apply pea-sized amount to port-a-cath site and cover with plastic wrap one hour prior to chemotherapy appointments  . loperamide (IMODIUM A-D) 2 MG tablet Take 1 tablet (2 mg total) by mouth as needed. Take 2 at diarrhea onset , then 1 every 2hr until 12hrs with no BM. May take 2 every 4hrs at night. If diarrhea recurs repeat.  . meclizine (ANTIVERT) 25 MG tablet Take 1 tablet (25 mg total) by mouth 3 (three) times daily as needed for dizziness.  . NP THYROID 30 MG tablet Take 30 mg by mouth daily before breakfast.   . Omega-3 1000 MG CAPS Take 1,000 mg by mouth daily.  . ondansetron (ZOFRAN) 8 MG tablet Take 1 tablet (8 mg total) by mouth every 6 (six) hours as needed for nausea or vomiting.  . OXALIPLATIN IV  Inject into the vein every 14 (fourteen) days.  . pantoprazole (PROTONIX) 20 MG tablet Take 20 mg by mouth daily.   . prochlorperazine (COMPAZINE) 10 MG tablet Take 1  tablet (10 mg total) by mouth every 6 (six) hours as needed (NAUSEA).  Marland Kitchen sertraline (ZOLOFT) 50 MG tablet Take 50 mg by mouth every morning.   . tamsulosin (FLOMAX) 0.4 MG CAPS capsule Take 0.4 mg by mouth daily.   . ticagrelor (BRILINTA) 90 MG TABS tablet Take 1 tablet (90 mg total) by mouth 2 (two) times daily.  . traMADol (ULTRAM) 50 MG tablet   . vitamin B-12 (CYANOCOBALAMIN) 1000 MCG tablet Take 1,000 mcg by mouth daily.   Facility-Administered Encounter Medications as of 05/21/2019  Medication  . 0.9 %  sodium chloride infusion  . 0.9 %  sodium chloride infusion  . dextrose 5 % solution  . sodium chloride flush (NS) 0.9 % injection 10 mL    ALLERGIES:  Allergies  Allergen Reactions  . Demerol [Meperidine Hcl] Other (See Comments)    convulsions     PHYSICAL EXAM:  ECOG Performance status: 1  There were no vitals filed for this visit. There were no vitals filed for this visit.  Physical Exam Vitals signs reviewed.  Constitutional:      Appearance: Normal appearance.  Cardiovascular:     Rate and Rhythm: Normal rate and regular rhythm.     Heart sounds: Normal heart sounds.  Pulmonary:     Effort: Pulmonary effort is normal.     Breath sounds: Normal breath sounds.  Abdominal:     General: There is no distension.     Palpations: Abdomen is soft. There is no mass.  Musculoskeletal:        General: No swelling.  Skin:    General: Skin is warm.  Neurological:     General: No focal deficit present.     Mental Status: He is alert and oriented to person, place, and time.  Psychiatric:        Mood and Affect: Mood normal.        Behavior: Behavior normal.    Left ankle in a boot.  LABORATORY DATA:  I have reviewed the labs as listed.  CBC    Component Value Date/Time   WBC 12.7 (H) 05/21/2019  0834   RBC 4.22 05/21/2019 0834   HGB 13.4 05/21/2019 0834   HGB 14.9 12/11/2018 1018   HCT 41.0 05/21/2019 0834   PLT 289 05/21/2019 0834   PLT 335 12/11/2018 1018   MCV 97.2 05/21/2019 0834   MCH 31.8 05/21/2019 0834   MCHC 32.7 05/21/2019 0834   RDW 18.2 (H) 05/21/2019 0834   LYMPHSABS 2.2 05/21/2019 0834   MONOABS 1.7 (H) 05/21/2019 0834   EOSABS 0.3 05/21/2019 0834   BASOSABS 0.1 05/21/2019 0834   CMP Latest Ref Rng & Units 05/21/2019 05/07/2019 04/23/2019  Glucose 70 - 99 mg/dL 159(H) 214(H) 231(H)  BUN 8 - 23 mg/dL _0 Creatinine 0.61 - 1.24 mg/dL 0.56(L) 0.54(L) 0.51(L)  Sodium 135 - 145 mmol/L 139 139 141  Potassium 3.5 - 5.1 mmol/L 4.0 4.0 3.9  Chloride 98 - 111 mmol/L 107 108 107  CO2 22 - 32 mmol/L _1 Calcium 8.9 - 10.3 mg/dL 8.8(L) 9.0 9.0  Total Protein 6.5 - 8.1 g/dL 6.6 6.8 6.8  Total Bilirubin 0.3 - 1.2 mg/dL 0.6 0.4 0.5  Alkaline Phos 38 - 126 U/L 166(H) 157(H) 175(H)  AST 15 - 41 U/L _2 ALT 0 - 44 U/L _3 DIAGNOSTIC IMAGING:  I have independently reviewed the scans and discussed  with the patient.      ASSESSMENT & PLAN:   Pancreatic adenocarcinoma ([facility]) 1.  Stage IIb (T2N1) pancreatic adenocarcinoma: -Status post distal pancreatectomy and splenectomy at [facility] by Dr. Zenia Resides on 11/20/2018. 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 - 9 cycles of FOLFIRINOX from 12/30/2018 through 04/23/2019. - His vertigo has improved when he takes meclizine. - He reported numbness in the left lower lip for the past 3 weeks.  Has not worsened.  It is not affecting his chewing ability.  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 - We reviewed MRI of the brain dated 05/19/2019 which did not reveal any new abnormalities.  Old infarcts stable. - He also developed some numbness in the balls of his feet. -I have reviewed his labs.  I plan to cut back on oxaliplatin dose to 50% during cycle 11 today. -he will come back in 2 weeks for  follow-up and cycle 12.  2.  Post splenectomy state: -He received vaccinations prior to splenectomy.  We will follow-up on booster schedule.  3.  Diabetes: - He is continuing Basaglar 42 units daily.  This is managed by Dr. Dorris Fetch.  4.  Neuropathy: -He reported new numbness in the balls of his toes.  This happened after cycle time. -Oxaliplatin was dose reduced by 25% from cycle 1.  I will cut back to 50% starting cycle 11.   Total time spent is 25 minutes with more than 50% of the time spent face-to-face discussing and reinforcing treatment plan and coordination of care.  Orders placed this encounter:  No orders of the defined types were placed in this encounter.     Derek Jack, MD [place] [phone]

## 2019-05-21 NOTE — Progress Notes (Unsigned)
Labs reviewed with MD this morning during office visit. Oxaliplatin will have dose reduction done today per MD.   Treatment given per orders. Patient tolerated it well without problems. Vitals stable and discharged home from clinic ambulatory. Follow up as scheduled.

## 2019-05-21 NOTE — Assessment & Plan Note (Addendum)
1.  Stage IIb (T2N1) pancreatic adenocarcinoma: -Status post distal pancreatectomy and splenectomy at [facility] by Dr. Zenia Resides on 11/20/2018. 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 - 9 cycles of FOLFIRINOX from 12/30/2018 through 04/23/2019. - His vertigo has improved when he takes meclizine. - He reported numbness in the left lower lip for the past 3 weeks.  Has not worsened.  It is not affecting his chewing ability.  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 - We reviewed MRI of the brain dated 05/19/2019 which did not reveal any new abnormalities.  Old infarcts stable. - He also developed some numbness in the balls of his feet. -I have reviewed his labs.  I plan to cut back on oxaliplatin dose to 50% during cycle 11 today. -he will come back in 2 weeks for follow-up and cycle 12.  2.  Post splenectomy state: -He received vaccinations prior to splenectomy.  We will follow-up on booster schedule.  3.  Diabetes: - He is continuing Basaglar 42 units daily.  This is managed by Dr. Dorris Fetch.  4.  Neuropathy: -He reported new numbness in the balls of his toes.  This happened after cycle time. -Oxaliplatin was dose reduced by 25% from cycle 1.  I will cut back to 50% starting cycle 11.

## 2019-05-21 NOTE — Patient Instructions (Signed)
[facility] Discharge Instructions for Patients Receiving Chemotherapy  Today you received the following chemotherapy agents   To help prevent nausea and vomiting after your treatment, we encourage you to take your nausea medication   If you develop nausea and vomiting that is not controlled by your nausea medication, call the clinic.   BELOW ARE SYMPTOMS THAT SHOULD BE REPORTED IMMEDIATELY:  *FEVER GREATER THAN 100.5 F  *CHILLS WITH OR WITHOUT FEVER  NAUSEA AND VOMITING THAT IS NOT CONTROLLED WITH YOUR NAUSEA MEDICATION  *UNUSUAL SHORTNESS OF BREATH  *UNUSUAL BRUISING OR BLEEDING  TENDERNESS IN MOUTH AND THROAT WITH OR WITHOUT PRESENCE OF ULCERS  *URINARY PROBLEMS  *BOWEL PROBLEMS  UNUSUAL RASH Items with * indicate a potential emergency and should be followed up as soon as possible.  Feel free to call the clinic should you have any questions or concerns. The clinic phone number is [phone].  Please show the CHEMO ALERT CARD at check-in to the Emergency Department and triage nurse.   

## 2019-05-21 NOTE — Patient Instructions (Signed)
[facility] Discharge Instructions  You were seen today by Dr. Katragadda. He went over your recent lab results. He will see you back in 2 weeks for labs and follow up.   Thank you for choosing [facility] at [facility] to provide your oncology and hematology care.  To afford each patient quality time with our provider, please arrive at least 15 minutes before your scheduled appointment time.   If you have a lab appointment with the Cancer Center please come in thru the  Main Entrance and check in at the main information desk  You need to re-schedule your appointment should you arrive 10 or more minutes late.  We strive to give you quality time with our providers, and arriving late affects you and other patients whose appointments are after yours.  Also, if you no show three or more times for appointments you may be dismissed from the clinic at the providers discretion.     Again, thank you for choosing [facility].  Our hope is that these requests will decrease the amount of time that you wait before being seen by our physicians.       _____________________________________________________________  Should you have questions after your visit to [facility], please contact our office at [phone] between the hours of 8:00 a.m. and 4:30 p.m.  Voicemails left after 4:00 p.m. will not be returned until the following business day.  For prescription refill requests, have your pharmacy contact our office and allow 72 hours.    Cancer Center Support Programs:   > Cancer Support Group  2nd Tuesday of the month 1pm-2pm, Journey Room    

## 2019-05-23 ENCOUNTER — Inpatient Hospital Stay (HOSPITAL_COMMUNITY): Payer: PRIVATE HEALTH INSURANCE

## 2019-05-23 ENCOUNTER — Other Ambulatory Visit: Payer: Self-pay

## 2019-05-23 ENCOUNTER — Encounter (HOSPITAL_COMMUNITY): Payer: Self-pay

## 2019-05-23 VITALS — BP 119/64 | HR 71 | Temp 97.6°F | Resp 18

## 2019-05-23 DIAGNOSIS — C259 Malignant neoplasm of pancreas, unspecified: Secondary | ICD-10-CM

## 2019-05-23 DIAGNOSIS — Z5111 Encounter for antineoplastic chemotherapy: Secondary | ICD-10-CM | POA: Diagnosis not present

## 2019-05-23 DIAGNOSIS — Z95828 Presence of other vascular implants and grafts: Secondary | ICD-10-CM

## 2019-05-23 MED ORDER — PEGFILGRASTIM-JMDB 6 MG/0.6ML ~~LOC~~ SOSY
6.0000 mg | PREFILLED_SYRINGE | Freq: Once | SUBCUTANEOUS | Status: AC
  Administered 2019-05-23: 6 mg via SUBCUTANEOUS
  Filled 2019-05-23: qty 0.6

## 2019-05-23 MED ORDER — SODIUM CHLORIDE 0.9% FLUSH
10.0000 mL | INTRAVENOUS | Status: DC | PRN
  Administered 2019-05-23: 10 mL
  Filled 2019-05-23: qty 10

## 2019-05-23 MED ORDER — HEPARIN SOD (PORK) LOCK FLUSH 100 UNIT/ML IV SOLN
500.0000 [IU] | Freq: Once | INTRAVENOUS | Status: AC | PRN
  Administered 2019-05-23: 13:00:00 500 [IU]

## 2019-05-23 NOTE — Progress Notes (Signed)
Patients port flushed without difficulty.  Good blood return noted with no bruising or swelling noted at site.  Band aid applied.  VSS with discharge and left ambulatory with no s/s of distress noted.  

## 2019-06-04 ENCOUNTER — Inpatient Hospital Stay (HOSPITAL_COMMUNITY): Payer: PRIVATE HEALTH INSURANCE

## 2019-06-04 ENCOUNTER — Inpatient Hospital Stay (HOSPITAL_BASED_OUTPATIENT_CLINIC_OR_DEPARTMENT_OTHER): Payer: PRIVATE HEALTH INSURANCE | Admitting: Hematology

## 2019-06-04 ENCOUNTER — Encounter (HOSPITAL_COMMUNITY): Payer: Self-pay | Admitting: Hematology

## 2019-06-04 ENCOUNTER — Other Ambulatory Visit: Payer: Self-pay

## 2019-06-04 VITALS — BP 123/68 | HR 102 | Temp 97.1°F | Resp 18 | Wt 155.0 lb

## 2019-06-04 VITALS — BP 119/59 | HR 80 | Temp 97.6°F | Resp 18

## 2019-06-04 DIAGNOSIS — Z5111 Encounter for antineoplastic chemotherapy: Secondary | ICD-10-CM | POA: Diagnosis not present

## 2019-06-04 DIAGNOSIS — Z95828 Presence of other vascular implants and grafts: Secondary | ICD-10-CM

## 2019-06-04 DIAGNOSIS — C259 Malignant neoplasm of pancreas, unspecified: Secondary | ICD-10-CM

## 2019-06-04 LAB — CBC WITH DIFFERENTIAL/PLATELET
Abs Immature Granulocytes: 0.1 10*3/uL — ABNORMAL HIGH (ref 0.00–0.07)
Basophils Absolute: 0.1 10*3/uL (ref 0.0–0.1)
Basophils Relative: 1 %
Eosinophils Absolute: 0 10*3/uL (ref 0.0–0.5)
Eosinophils Relative: 0 %
HCT: 43.2 % (ref 39.0–52.0)
Hemoglobin: 14.5 g/dL (ref 13.0–17.0)
Immature Granulocytes: 1 %
Lymphocytes Relative: 11 %
Lymphs Abs: 1.9 10*3/uL (ref 0.7–4.0)
MCH: 32.4 pg (ref 26.0–34.0)
MCHC: 33.6 g/dL (ref 30.0–36.0)
MCV: 96.4 fL (ref 80.0–100.0)
Monocytes Absolute: 1.4 10*3/uL — ABNORMAL HIGH (ref 0.1–1.0)
Monocytes Relative: 9 %
Neutro Abs: 13.3 10*3/uL — ABNORMAL HIGH (ref 1.7–7.7)
Neutrophils Relative %: 78 %
Platelets: 319 10*3/uL (ref 150–400)
RBC: 4.48 MIL/uL (ref 4.22–5.81)
RDW: 17.9 % — ABNORMAL HIGH (ref 11.5–15.5)
WBC: 16.9 10*3/uL — ABNORMAL HIGH (ref 4.0–10.5)
nRBC: 0 % (ref 0.0–0.2)

## 2019-06-04 LAB — COMPREHENSIVE METABOLIC PANEL
ALT: 22 U/L (ref 0–44)
AST: 21 U/L (ref 15–41)
Albumin: 4 g/dL (ref 3.5–5.0)
Alkaline Phosphatase: 208 U/L — ABNORMAL HIGH (ref 38–126)
Anion gap: 11 (ref 5–15)
BUN: 13 mg/dL (ref 8–23)
CO2: 24 mmol/L (ref 22–32)
Calcium: 9 mg/dL (ref 8.9–10.3)
Chloride: 105 mmol/L (ref 98–111)
Creatinine, Ser: 0.52 mg/dL — ABNORMAL LOW (ref 0.61–1.24)
GFR calc Af Amer: >60 mL/min (ref >60)
GFR calc non Af Amer: >60 mL/min (ref >60)
Glucose, Bld: 198 mg/dL — ABNORMAL HIGH (ref 70–99)
Potassium: 3.3 mmol/L — ABNORMAL LOW (ref 3.5–5.1)
Sodium: 140 mmol/L (ref 135–145)
Total Bilirubin: 0.7 mg/dL (ref 0.3–1.2)
Total Protein: 6.9 g/dL (ref 6.5–8.1)

## 2019-06-04 MED ORDER — PALONOSETRON HCL INJECTION 0.25 MG/5ML
0.2500 mg | Freq: Once | INTRAVENOUS | Status: AC
  Administered 2019-06-04: 0.25 mg via INTRAVENOUS
  Filled 2019-06-04: qty 5

## 2019-06-04 MED ORDER — SODIUM CHLORIDE 0.9 % IV SOLN
150.0000 mg/m2 | Freq: Once | INTRAVENOUS | Status: AC
  Administered 2019-06-04: 280 mg via INTRAVENOUS
  Filled 2019-06-04: qty 4

## 2019-06-04 MED ORDER — DEXTROSE 5 % IV SOLN
Freq: Once | INTRAVENOUS | Status: AC
  Administered 2019-06-04: 10:00:00 via INTRAVENOUS

## 2019-06-04 MED ORDER — OXALIPLATIN CHEMO INJECTION 100 MG/20ML
42.5000 mg/m2 | Freq: Once | INTRAVENOUS | Status: AC
  Administered 2019-06-04: 80 mg via INTRAVENOUS
  Filled 2019-06-04: qty 16

## 2019-06-04 MED ORDER — SODIUM CHLORIDE 0.9 % IV SOLN
2400.0000 mg/m2 | INTRAVENOUS | Status: DC
  Administered 2019-06-04: 4450 mg via INTRAVENOUS
  Filled 2019-06-04: qty 89

## 2019-06-04 MED ORDER — SODIUM CHLORIDE 0.9% FLUSH
10.0000 mL | INTRAVENOUS | Status: DC | PRN
  Administered 2019-06-04: 09:00:00 10 mL
  Filled 2019-06-04: qty 10

## 2019-06-04 MED ORDER — DEXTROSE 5 % IV SOLN
Freq: Once | INTRAVENOUS | Status: AC
  Administered 2019-06-04: 14:00:00 via INTRAVENOUS

## 2019-06-04 MED ORDER — SODIUM CHLORIDE 0.9 % IV SOLN
Freq: Once | INTRAVENOUS | Status: AC
  Administered 2019-06-04: 10:00:00 via INTRAVENOUS
  Filled 2019-06-04: qty 5

## 2019-06-04 MED ORDER — ATROPINE SULFATE 1 MG/ML IJ SOLN
0.5000 mg | Freq: Once | INTRAMUSCULAR | Status: AC | PRN
  Administered 2019-06-04: 0.5 mg via INTRAVENOUS
  Filled 2019-06-04: qty 1

## 2019-06-04 MED ORDER — SODIUM CHLORIDE 0.9 % IV SOLN
376.0000 mg/m2 | Freq: Once | INTRAVENOUS | Status: AC
  Administered 2019-06-04: 700 mg via INTRAVENOUS
  Filled 2019-06-04: qty 35

## 2019-06-04 NOTE — Assessment & Plan Note (Signed)
1.  Stage IIb (T2N1) pancreatic adenocarcinoma: -Status post distal pancreatectomy and splenectomy at [facility] by Dr. Zenia Resides on 11/20/2018. 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 - 9 cycles of FOLFIRINOX from 12/30/2018 through 04/23/2019. - His vertigo has improved when he takes meclizine. - He reported numbness in the left lower lip for the past 3 weeks.  Has not worsened.  It is not affecting his chewing ability.  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 - We reviewed MRI of the brain dated 05/19/2019 which did not reveal any new abnormalities.  Old infarcts stable. - He also developed some numbness in the balls of his feet, this remains stable. - Labs are acceptable to proceed with cycle 12. Oxaliplatin will remain dose reduced at 50%. - Plan to restage with CT CAP prior to next visit in 2 weeks.   2.  Post splenectomy state: -He received vaccinations prior to splenectomy.  We will follow-up on booster schedule.  3.  Diabetes: - He is continuing Basaglar 42 units daily.  This is managed by Dr. Dorris Fetch.  4.  Neuropathy: -He reported new numbness in the balls of his toes.  This happened after cycle time. -Oxaliplatin was dose reduced by 25% from cycle 1.  I will cut back to 50% starting cycle 11 and 12.

## 2019-06-04 NOTE — Progress Notes (Signed)
06/04/19  OK to treat with HR 102  Dr Beckey Downing LPN/Khushi Zupko Ronnald Ramp, PharmD

## 2019-06-04 NOTE — Patient Instructions (Signed)
[facility] Discharge Instructions for Patients Receiving Chemotherapy   Beginning January 23rd 2017 lab work for the Cancer Center will be done in the  Main lab at [facility] on 1st floor. If you have a lab appointment with the Cancer Center please come in thru the  Main Entrance and check in at the main information desk   Today you received the following chemotherapy agents Oxaliplatin,Leucovorin,Irinotecan and 5FU. Follow up as scheduled. Call clinic for any questions or concerns  To help prevent nausea and vomiting after your treatment, we encourage you to take your nausea medication   If you develop nausea and vomiting, or diarrhea that is not controlled by your medication, call the clinic.  The clinic phone number is [phone]. Office hours are Monday-Friday 8:30am-5:00pm.  BELOW ARE SYMPTOMS THAT SHOULD BE REPORTED IMMEDIATELY:  *FEVER GREATER THAN 101.0 F  *CHILLS WITH OR WITHOUT FEVER  NAUSEA AND VOMITING THAT IS NOT CONTROLLED WITH YOUR NAUSEA MEDICATION  *UNUSUAL SHORTNESS OF BREATH  *UNUSUAL BRUISING OR BLEEDING  TENDERNESS IN MOUTH AND THROAT WITH OR WITHOUT PRESENCE OF ULCERS  *URINARY PROBLEMS  *BOWEL PROBLEMS  UNUSUAL RASH Items with * indicate a potential emergency and should be followed up as soon as possible. If you have an emergency after office hours please contact your primary care physician or go to the nearest emergency department.  Please call the clinic during office hours if you have any questions or concerns.   You may also contact the Patient Navigator at [phone] should you have any questions or need assistance in obtaining follow up care.      Resources For Cancer Patients and their Caregivers ? American Cancer Society: Can assist with transportation, wigs, general needs, runs Look Good Feel Better.        1-888-227-6333 ? Cancer Care: Provides financial assistance, online support groups, medication/co-pay  assistance.  1-800-813-HOPE (4673) ? [facility] Assists [place] cancer patients and their families through emotional , educational and financial support.  336-427-4357 ? [place] DSS Where to apply for food stamps, Medicaid and utility assistance. 336-342-1394 ? [facility]: Transportation to medical appointments. 336-347-2287 ? Social Security Administration: May apply for disability if have a Stage IV cancer. 336-342-7796 1-800-772-1213 ? [place] Aging, Disability and Transit Services: Assists with nutrition, care and transit needs. 336-349-2343         

## 2019-06-04 NOTE — Progress Notes (Signed)
0930 Labs reviewed with and pt seen by Dr. Delton Coombes and pt approved for chemo tx today per MD                            Mark Foster tolerated chemo tx well without complaints or incident. 5FU pump infusing upon discharge without issues. VSS upon discharge. Pt discharged self ambulatory in satisfactory condition

## 2019-06-04 NOTE — Progress Notes (Signed)
Mark Foster, [place] [id number]   CLINIC:  Medical Oncology/Hematology  PCP:  Doree Albee, MD [place] [address] [phone]   REASON FOR VISIT:  Follow-up for pancreatic cancer      BRIEF ONCOLOGIC HISTORY:  Oncology History  Pancreatic adenocarcinoma ([place])  08/20/2018 Imaging   CT abdomen/pelvis w/ contrast: IMPRESSION: Atrophy and ductal dilatation involving the pancreatic tail, with suspected small soft tissue mass in the pancreatic body which could represent pancreatic carcinoma. Abdomen MRI and MRCP without and with contrast is recommended for further evaluation.  No evidence of hepatobiliary disease.   08/30/2018 Imaging   MRI abdomen w/ contrast: IMPRESSION: 1. Although not definitive, there remains concern of a small hypoenhancing mass at the junction of the pancreatic body and tail associated with atrophy and ductal dilatation in the pancreatic tail. This remains concerning for pancreatic neoplasm. Postinflammatory stricture less likely. Besides a tiny cystic lesion in the pancreatic tail, there are no other signs of previous pancreatitis. Further evaluation with endoscopic ultrasound for possible biopsy strongly recommended. 2. No evidence of metastatic disease. 3. Cholelithiasis without evidence of cholecystitis or biliary dilatation.   09/03/2018 Initial Diagnosis   Pancreatic adenocarcinoma ([place])   10/03/2018 Procedure   EUS: - 2.6cm irregularly shaped mass in the body of the pancreas that causing main pancreatic duct obstruction and dilation. The mass abuts the splenic vessels but no other significant vascular structures and it was sampled with trangastric EUS FNA. Preliminary cytology is + for malignancy, likely well-differentiated adenocarcinoma. It appears surgically resectable.   10/03/2018 Pathology Results   Accession: [id number]  FINE NEEDLE ASPIRATION, ENDOSCOPIC, PANCREAS BODY  (SPECIMEN 1 OF 1 COLLECTED 10/03/18): ADENOCARCINOMA.   10/17/2018 Imaging   PET: IMPRESSION: 1. Tiny focus of hypermetabolism identified in the body of pancreas, adjacent to the abrupt cut off of the main pancreatic duct. No evidence for hypermetabolic metastatic disease in the neck, chest, abdomen, or pelvis. 2. Cholelithiasis. 3.  Aortic Atherosclerois (ICD10-170.0) 4. 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  The Hereditary Gene Panel offered by [facility] includes sequencing and/or deletion duplication testing of the following 47 genes: APC, ATM, AXIN2, BARD1, BMPR1A, BRCA1, BRCA2, BRIP1, CDH1, CDK4, CDKN2A (p14ARF), CDKN2A (p16INK4a), CHEK2, CTNNA1, DICER1, EPCAM (Deletion/duplication testing only), GREM1 (promoter region deletion/duplication testing only), KIT, MEN1, MLH1, MSH2, MSH3, MSH6, MUTYH, NBN, NF1, NHTL1, PALB2, PDGFRA, PMS2, POLD1, POLE, PTEN, RAD50, RAD51C, RAD51D, SDHB, SDHC, SDHD, SMAD4, SMARCA4. STK11, TP53, TSC1, TSC2, and VHL.  The following genes were evaluated for sequence changes only: SDHA and HOXB13 c.251G>A variant only. The report date is November 12, 2018.   11/20/2018 Surgery   Distal subtotal pancreatectomy with splenectomy at [facility]   11/20/2018 Surgery   TUMOR   Tumor Site:  Pancreatic body    Histologic Type:  Ductal adenocarcinoma    Histologic Grade:  G1: Well differentiated    Tumor Size:  Greatest dimension in Centimeters (cm): 2.8 Centimeters (cm)    Additional Dimension in Centimeters (cm):  2.7 Centimeters (cm)    Additional Dimension in Centimeters (cm):  1.8 Centimeters (cm)   Tumor Extent:      Tumor Extension:  Tumor is confined to pancreas    Accessory Findings:      Treatment Effect:  No known presurgical therapy     Lymphovascular Invasion:  Not identified     Perineural Invasion:  Not identified  MARGINS   Margins:      Proximal  Pancreatic Parenchymal Margin:  Uninvolved by invasive carcinoma and pancreatic high-grade intraepithelial neoplasia      Distance of Invasive Carcinoma from Margin:  0.6 Centimeters (cm)   :      Other Margin:  Splenic margin.      Margin Status:  Uninvolved by invasive carcinoma   LYMPH NODES  Number of Lymph Nodes Involved:  2   Number of Lymph Nodes Examined:  16   PATHOLOGIC STAGE CLASSIFICATION (pTNM, AJCC 8th Edition)  TNM Descriptors:  Not applicable   Primary Tumor (pT):  pT2   Regional Lymph Nodes (pN):  pN1   ADDITIONAL FINDINGS  Additional Pathologic Findings:  Pancreatic intraepithelial neoplasia    Highest Grade (PanIN):  High grade PanIN is present.   Additional Pathologic Findings:  Benign unilocular pancreatic cyst (1.3 cm in greatest dimension) at the pancreatic tail.   12/30/2018 -  Chemotherapy   The patient had palonosetron (ALOXI) injection 0.25 mg, 0.25 mg, Intravenous,  Once, 12 of 12 cycles Administration: 0.25 mg (12/30/2018), 0.25 mg (01/13/2019), 0.25 mg (01/28/2019), 0.25 mg (02/11/2019), 0.25 mg (02/25/2019), 0.25 mg (03/11/2019), 0.25 mg (03/25/2019), 0.25 mg (04/09/2019), 0.25 mg (04/23/2019), 0.25 mg (05/07/2019), 0.25 mg (05/21/2019), 0.25 mg (06/04/2019) pegfilgrastim (NEULASTA) injection 6 mg, 6 mg, Subcutaneous, Once, 7 of 7 cycles Administration: 6 mg (01/01/2019), 6 mg (01/15/2019), 6 mg (01/30/2019), 6 mg (02/13/2019), 6 mg (02/27/2019), 6 mg (03/13/2019), 6 mg (03/27/2019) pegfilgrastim-jmdb (FULPHILA) injection 6 mg, 6 mg, Subcutaneous,  Once, 5 of 5 cycles Administration: 6 mg (04/11/2019), 6 mg (04/25/2019), 6 mg (05/09/2019), 6 mg (05/23/2019) irinotecan (CAMPTOSAR) 280 mg in sodium chloride 0.9 % 500 mL chemo infusion, 150 mg/m2 = 280 mg (100 % of original dose 150 mg/m2), Intravenous,  Once, 12 of 12 cycles Dose modification: 150 mg/m2 (original dose 150 mg/m2, Cycle 1, Reason: Provider Judgment) Administration: 280 mg (12/30/2018), 280  mg (01/13/2019), 280 mg (01/28/2019), 280 mg (02/11/2019), 280 mg (02/25/2019), 280 mg (03/11/2019), 280 mg (03/25/2019), 280 mg (04/09/2019), 280 mg (04/23/2019), 280 mg (05/07/2019), 280 mg (05/21/2019), 280 mg (06/04/2019) leucovorin 700 mg in sodium chloride 0.9 % 250 mL infusion, 744 mg, Intravenous,  Once, 12 of 12 cycles Administration: 700 mg (12/30/2018), 700 mg (01/13/2019), 700 mg (01/28/2019), 700 mg (02/11/2019), 700 mg (02/25/2019), 700 mg (03/11/2019), 700 mg (03/25/2019), 700 mg (04/09/2019), 700 mg (04/23/2019), 700 mg (05/07/2019), 700 mg (05/21/2019), 700 mg (06/04/2019) oxaliplatin (ELOXATIN) 120 mg in dextrose 5 % 500 mL chemo infusion, 63.75 mg/m2 = 120 mg (75 % of original dose 85 mg/m2), Intravenous,  Once, 12 of 12 cycles Dose modification: 63.75 mg/m2 (75 % of original dose 85 mg/m2, Cycle 1, Reason: Other (see comments), Comment: high risk for worsening neuropathy), 42.5 mg/m2 (50 % of original dose 85 mg/m2, Cycle 11, Reason: Other (see comments), Comment: neuropathy) Administration: 120 mg (12/30/2018), 120 mg (01/13/2019), 120 mg (01/28/2019), 120 mg (02/11/2019), 120 mg (02/25/2019), 120 mg (03/11/2019), 120 mg (03/25/2019), 120 mg (04/09/2019), 120 mg (04/23/2019), 120 mg (05/07/2019), 80 mg (05/21/2019), 80 mg (06/04/2019) fosaprepitant (EMEND) 150 mg, dexamethasone (DECADRON) 12 mg in sodium chloride 0.9 % 145 mL IVPB, , Intravenous,  Once, 12 of 12 cycles Administration:  (12/30/2018),  (01/13/2019),  (01/28/2019),  (02/11/2019),  (02/25/2019),  (03/11/2019),  (03/25/2019),  (04/09/2019),  (04/23/2019),  (05/07/2019),  (05/21/2019),  (06/04/2019) fluorouracil (ADRUCIL) 4,450 mg in sodium chloride 0.9 % 61 mL chemo infusion, 2,400 mg/m2 = 4,450 mg, Intravenous, 1 Day/Dose,  12 of 12 cycles Administration: 4,450 mg (12/30/2018), 4,450 mg (01/13/2019), 4,450 mg (01/28/2019), 4,450 mg (02/11/2019), 4,450 mg (02/25/2019), 4,450 mg (03/11/2019), 4,450 mg (03/25/2019), 4,450 mg (04/09/2019), 4,450 mg (04/23/2019), 4,450 mg (05/07/2019), 4,450 mg  (05/21/2019), 4,450 mg (06/04/2019)  for chemotherapy treatment.       CANCER STAGING: Cancer Staging No matching staging information was found for the patient.   INTERVAL HISTORY:  Mr. Jacinto 63 y.o. male seen for last cycle of high-grade cancer chemotherapy.  He is here for cycle 12.  Appetite is 100%.  Energy levels are 50%.  Numbness in the feet has been stable.  It is mainly in balls of the toe.  Denies any nausea, vomiting or diarrhea or constipation.  No fevers or infections.  REVIEW OF SYSTEMS:  Review of Systems  Neurological: Positive for numbness.  All other systems reviewed and are negative.    PAST MEDICAL/SURGICAL HISTORY:  Past Medical History:  Diagnosis Date  . Cervical compression fracture ([place])   . Cervical spinal stenosis   . Diabetic neuropathy ([facility])    Bilateral legs  . Diverticulitis   . Family history of pancreatic cancer   . History of kidney stones   . Hypothyroidism   . Pancreatic cancer ([place])   . Stenosis of left vertebral artery   . Stroke [facility])    2011  . Type 2 diabetes mellitus ([place])    Past Surgical History:  Procedure Laterality Date  . APPENDECTOMY    . COLON RESECTION     For diverticulitis  . COLON SURGERY    . ESOPHAGOGASTRODUODENOSCOPY N/A 10/03/2018   Procedure: ESOPHAGOGASTRODUODENOSCOPY (EGD);  Surgeon: [name], MD;  Location: [name];  Service: Endoscopy;  Laterality: N/A;  . EUS N/A 10/03/2018   Procedure: UPPER ENDOSCOPIC ULTRASOUND (EUS) RADIAL;  Surgeon: [name], MD;  Location: [facility];  Service: Endoscopy;  Laterality: N/A;  . FINE NEEDLE ASPIRATION N/A 10/03/2018   Procedure: FINE NEEDLE ASPIRATION (FNA) LINEAR;  Surgeon: [name], MD;  Location: [facility];  Service: Endoscopy;  Laterality: N/A;  . IR ANGIO INTRA EXTRACRAN SEL COM CAROTID INNOMINATE BILAT MOD SED  01/21/2018  . IR ANGIO INTRA EXTRACRAN SEL COM CAROTID INNOMINATE UNI R MOD SED  03/21/2018  . IR ANGIO VERTEBRAL SEL  VERTEBRAL BILAT MOD SED  01/21/2018  . IR TRANSCATH EXCRAN VERT OR CAR A STENT  03/21/2018  . PORTACATH PLACEMENT Left 12/23/2018   Procedure: INSERTION PORT-A-CATH;  Surgeon: Virl Cagey, MD;  Location: [facility];  Service: General;  Laterality: Left;  . RADIOLOGY WITH ANESTHESIA N/A 03/21/2018   Procedure: IR WITH ANESTHESIA WITH STENT PLACEMENT;  Surgeon: Luanne Bras, MD;  Location: [facility];  Service: Radiology;  Laterality: N/A;  . ROTATOR CUFF REPAIR     Left     SOCIAL HISTORY:  Social History   Socioeconomic History  . Marital status: Single    Spouse name: Not on file  . Number of children: 2  . Years of education: Not on file  . Highest education level: Not on file  Occupational History  . Occupation: Estate agent  Social Needs  . Financial resource strain: Somewhat hard  . Food insecurity    Worry: Never true    Inability: Never true  . Transportation needs    Medical: No    Non-medical: No  Tobacco Use  . Smoking status: Never Smoker  . Smokeless tobacco: Never Used  Substance and Sexual Activity  . Alcohol use:  Not Currently  . Drug use: Never  . Sexual activity: Not on file  Lifestyle  . Physical activity    Days per week: 0 days    Minutes per session: 0 min  . Stress: To some extent  Relationships  . Social connections    Talks on phone: More than three times a week    Gets together: Once a week    Attends religious service: More than 4 times per year    Active member of club or organization: No    Attends meetings of clubs or organizations: Never    Relationship status: Divorced  . Intimate partner violence    Fear of current or ex partner: No    Emotionally abused: No    Physically abused: No    Forced sexual activity: No  Other Topics Concern  . Not on file  Social History Narrative  . Not on file    FAMILY HISTORY:  Family History  Problem Relation Age of Onset  . Stroke Mother   . Pancreatic cancer Father 68       d. 26   . Stroke Maternal Grandmother   . Heart attack Maternal Grandfather   . Heart Problems Paternal Grandfather   . Scoliosis Daughter   . Muscular dystrophy Grandson     CURRENT MEDICATIONS:  Outpatient Encounter Medications as of 06/04/2019  Medication Sig  . aspirin 81 MG tablet Take 1 tablet (81 mg total) by mouth daily.  Marland Kitchen atorvastatin (LIPITOR) 80 MG tablet Take 1 tablet (80 mg total) by mouth daily at 6 PM.  . dextrose 5 % SOLN 1,000 mL with fluorouracil 5 GM/100ML SOLN Inject into the vein over 48 hr.  . ECHINACEA EXTRACT PO Take 2 tablets by mouth daily.   Marland Kitchen FLUOROURACIL IV Inject into the vein every 14 (fourteen) days.  Marland Kitchen gabapentin (NEURONTIN) 300 MG capsule Take 1 capsule (300 mg total) by mouth 2 (two) times daily.  . Insulin Glargine (BASAGLAR KWIKPEN) 100 UNIT/ML SOPN Inject 0.42 mLs (42 Units total) into the skin at bedtime.  . Insulin Pen Needle 32G X 4 MM MISC 1 Device by Does not apply route daily.  . IRINOTECAN HCL IV Inject into the vein every 14 (fourteen) days.  Marland Kitchen LEUCOVORIN CALCIUM IV Inject into the vein every 14 (fourteen) days.  . NP THYROID 30 MG tablet Take 30 mg by mouth daily before breakfast.   . Omega-3 1000 MG CAPS Take 1,000 mg by mouth daily.  . OXALIPLATIN IV Inject into the vein every 14 (fourteen) days.  . pantoprazole (PROTONIX) 20 MG tablet Take 20 mg by mouth daily.   . sertraline (ZOLOFT) 50 MG tablet Take 50 mg by mouth every morning.   . tamsulosin (FLOMAX) 0.4 MG CAPS capsule Take 0.4 mg by mouth daily.   . ticagrelor (BRILINTA) 90 MG TABS tablet Take 1 tablet (90 mg total) by mouth 2 (two) times daily.  . vitamin B-12 (CYANOCOBALAMIN) 1000 MCG tablet Take 1,000 mcg by mouth daily.  Marland Kitchen HYDROcodone-acetaminophen (NORCO/VICODIN) 5-325 MG tablet Take 1 tablet by mouth every 4 (four) hours as needed. (Patient not taking: Reported on 06/04/2019)  . lidocaine-prilocaine (EMLA) cream Apply pea-sized amount to port-a-cath site and cover with plastic wrap  one hour prior to chemotherapy appointments (Patient not taking: Reported on 06/04/2019)  . loperamide (IMODIUM A-D) 2 MG tablet Take 1 tablet (2 mg total) by mouth as needed. Take 2 at diarrhea onset , then 1 every 2hr until 12hrs  with no BM. May take 2 every 4hrs at night. If diarrhea recurs repeat. (Patient not taking: Reported on 06/04/2019)  . meclizine (ANTIVERT) 25 MG tablet Take 1 tablet (25 mg total) by mouth 3 (three) times daily as needed for dizziness. (Patient not taking: Reported on 06/04/2019)  . ondansetron (ZOFRAN) 8 MG tablet Take 1 tablet (8 mg total) by mouth every 6 (six) hours as needed for nausea or vomiting. (Patient not taking: Reported on 06/04/2019)  . prochlorperazine (COMPAZINE) 10 MG tablet Take 1 tablet (10 mg total) by mouth every 6 (six) hours as needed (NAUSEA). (Patient not taking: Reported on 06/04/2019)  . traMADol (ULTRAM) 50 MG tablet    Facility-Administered Encounter Medications as of 06/04/2019  Medication  . 0.9 %  sodium chloride infusion  . 0.9 %  sodium chloride infusion  . dextrose 5 % solution  . heparin lock flush 100 unit/mL  . sodium chloride flush (NS) 0.9 % injection 10 mL  . sodium chloride flush (NS) 0.9 % injection 10 mL    ALLERGIES:  Allergies  Allergen Reactions  . Demerol [Meperidine Hcl] Other (See Comments)    convulsions     PHYSICAL EXAM:  ECOG Performance status: 1  Vitals:   06/04/19 0825  BP: 123/68  Pulse: (!) 102  Resp: 18  Temp: (!) 97.1 F (36.2 C)  SpO2: 97%   Filed Weights   06/04/19 0825  Weight: 155 lb (70.3 kg)    Physical Exam Vitals signs reviewed.  Constitutional:      Appearance: Normal appearance.  Cardiovascular:     Rate and Rhythm: Normal rate and regular rhythm.     Heart sounds: Normal heart sounds.  Pulmonary:     Effort: Pulmonary effort is normal.     Breath sounds: Normal breath sounds.  Abdominal:     General: There is no distension.     Palpations: Abdomen is soft. There is no  mass.  Musculoskeletal:        General: No swelling.  Skin:    General: Skin is warm.  Neurological:     General: No focal deficit present.     Mental Status: He is alert and oriented to person, place, and time.  Psychiatric:        Mood and Affect: Mood normal.        Behavior: Behavior normal.    Left ankle in a boot.  LABORATORY DATA:  I have reviewed the labs as listed.  CBC    Component Value Date/Time   WBC 16.9 (H) 06/04/2019 0830   RBC 4.48 06/04/2019 0830   HGB 14.5 06/04/2019 0830   HGB 14.9 12/11/2018 1018   HCT 43.2 06/04/2019 0830   PLT 319 06/04/2019 0830   PLT 335 12/11/2018 1018   MCV 96.4 06/04/2019 0830   MCH 32.4 06/04/2019 0830   MCHC 33.6 06/04/2019 0830   RDW 17.9 (H) 06/04/2019 0830   LYMPHSABS 1.9 06/04/2019 0830   MONOABS 1.4 (H) 06/04/2019 0830   EOSABS 0.0 06/04/2019 0830   BASOSABS 0.1 06/04/2019 0830   CMP Latest Ref Rng & Units 06/04/2019 05/21/2019 05/07/2019  Glucose 70 - 99 mg/dL 198(H) 159(H) 214(H)  BUN 8 - 23 mg/dL '13 21 17  '$ Creatinine 0.61 - 1.24 mg/dL 0.52(L) 0.56(L) 0.54(L)  Sodium 135 - 145 mmol/L 140 139 139  Potassium 3.5 - 5.1 mmol/L 3.3(L) 4.0 4.0  Chloride 98 - 111 mmol/L 105 107 108  CO2 22 - 32 mmol/L 24 23 26  Calcium 8.9 - 10.3 mg/dL 9.0 8.8(L) 9.0  Total Protein 6.5 - 8.1 g/dL 6.9 6.6 6.8  Total Bilirubin 0.3 - 1.2 mg/dL 0.7 0.6 0.4  Alkaline Phos 38 - 126 U/L 208(H) 166(H) 157(H)  AST 15 - 41 U/L '21 20 17  '$ ALT 0 - 44 U/L '22 21 20       '$ DIAGNOSTIC IMAGING:  I have independently reviewed the scans and discussed with the patient.      ASSESSMENT & PLAN:   Pancreatic adenocarcinoma ([facility]) 1.  Stage IIb (T2N1) pancreatic adenocarcinoma: -Status post distal pancreatectomy and splenectomy at [facility] by Dr. Zenia Resides on 11/20/2018. 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 - 9 cycles of FOLFIRINOX from 12/30/2018 through 04/23/2019. - His vertigo has improved when he takes meclizine. - He reported numbness in the  left lower lip for the past 3 weeks.  Has not worsened.  It is not affecting his chewing ability.  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 - We reviewed MRI of the brain dated 05/19/2019 which did not reveal any new abnormalities.  Old infarcts stable. - He also developed some numbness in the balls of his feet, this remains stable. - Labs are acceptable to proceed with cycle 12. Oxaliplatin will remain dose reduced at 50%. - Plan to restage with CT CAP prior to next visit in 2 weeks.   2.  Post splenectomy state: -He received vaccinations prior to splenectomy.  We will follow-up on booster schedule.  3.  Diabetes: - He is continuing Basaglar 42 units daily.  This is managed by Dr. Dorris Fetch.  4.  Neuropathy: -He reported new numbness in the balls of his toes.  This happened after cycle time. -Oxaliplatin was dose reduced by 25% from cycle 1.  I will cut back to 50% starting cycle 11 and 12.   Total time spent is 25 minutes with more than 50% of the time spent face-to-face discussing and reinforcing treatment plan and coordination of care.  Orders placed this encounter:  Orders Placed This Encounter  Procedures  . CT Abdomen Pelvis W Contrast  . CT Chest W Contrast  . CBC with Differential/Platelet  . Comprehensive metabolic panel  . Cancer antigen 19-9      Derek Jack, MD [place] [phone]

## 2019-06-04 NOTE — Patient Instructions (Addendum)
[facility] at [facility] Discharge Instructions  You were seen today by Dr. Katragadda. He went over your recent lab results. He will schedule you for repeat scans prior to your next visit. He will see you back in 2 weeks for labs and follow up.   Thank you for choosing [facility] at [facility] to provide your oncology and hematology care.  To afford each patient quality time with our provider, please arrive at least 15 minutes before your scheduled appointment time.   If you have a lab appointment with the Cancer Center please come in thru the  Main Entrance and check in at the main information desk  You need to re-schedule your appointment should you arrive 10 or more minutes late.  We strive to give you quality time with our providers, and arriving late affects you and other patients whose appointments are after yours.  Also, if you no show three or more times for appointments you may be dismissed from the clinic at the providers discretion.     Again, thank you for choosing [facility].  Our hope is that these requests will decrease the amount of time that you wait before being seen by our physicians.       _____________________________________________________________  Should you have questions after your visit to [facility], please contact our office at [phone] between the hours of 8:00 a.m. and 4:30 p.m.  Voicemails left after 4:00 p.m. will not be returned until the following business day.  For prescription refill requests, have your pharmacy contact our office and allow 72 hours.    Cancer Center Support Programs:   > Cancer Support Group  2nd Tuesday of the month 1pm-2pm, Journey Room    

## 2019-06-06 ENCOUNTER — Inpatient Hospital Stay (HOSPITAL_COMMUNITY): Payer: PRIVATE HEALTH INSURANCE | Attending: Hematology

## 2019-06-06 ENCOUNTER — Other Ambulatory Visit: Payer: Self-pay

## 2019-06-06 VITALS — BP 119/67 | HR 86 | Temp 97.6°F | Resp 18

## 2019-06-06 DIAGNOSIS — Z9081 Acquired absence of spleen: Secondary | ICD-10-CM | POA: Insufficient documentation

## 2019-06-06 DIAGNOSIS — Z90411 Acquired partial absence of pancreas: Secondary | ICD-10-CM | POA: Insufficient documentation

## 2019-06-06 DIAGNOSIS — Z452 Encounter for adjustment and management of vascular access device: Secondary | ICD-10-CM | POA: Insufficient documentation

## 2019-06-06 DIAGNOSIS — C259 Malignant neoplasm of pancreas, unspecified: Secondary | ICD-10-CM | POA: Diagnosis not present

## 2019-06-06 DIAGNOSIS — E119 Type 2 diabetes mellitus without complications: Secondary | ICD-10-CM | POA: Diagnosis not present

## 2019-06-06 DIAGNOSIS — R5383 Other fatigue: Secondary | ICD-10-CM | POA: Insufficient documentation

## 2019-06-06 DIAGNOSIS — R200 Anesthesia of skin: Secondary | ICD-10-CM | POA: Diagnosis not present

## 2019-06-06 DIAGNOSIS — I700 Atherosclerosis of aorta: Secondary | ICD-10-CM | POA: Diagnosis not present

## 2019-06-06 DIAGNOSIS — Z79899 Other long term (current) drug therapy: Secondary | ICD-10-CM | POA: Insufficient documentation

## 2019-06-06 DIAGNOSIS — Z23 Encounter for immunization: Secondary | ICD-10-CM | POA: Diagnosis not present

## 2019-06-06 DIAGNOSIS — R42 Dizziness and giddiness: Secondary | ICD-10-CM | POA: Diagnosis not present

## 2019-06-06 DIAGNOSIS — Z95828 Presence of other vascular implants and grafts: Secondary | ICD-10-CM

## 2019-06-06 MED ORDER — PEGFILGRASTIM-JMDB 6 MG/0.6ML ~~LOC~~ SOSY
PREFILLED_SYRINGE | SUBCUTANEOUS | Status: AC
  Filled 2019-06-06: qty 0.6

## 2019-06-06 MED ORDER — HEPARIN SOD (PORK) LOCK FLUSH 100 UNIT/ML IV SOLN
500.0000 [IU] | Freq: Once | INTRAVENOUS | Status: AC | PRN
  Administered 2019-06-06: 500 [IU]

## 2019-06-06 MED ORDER — SODIUM CHLORIDE 0.9% FLUSH
10.0000 mL | INTRAVENOUS | Status: DC | PRN
  Administered 2019-06-06: 13:00:00 10 mL
  Filled 2019-06-06: qty 10

## 2019-06-06 MED ORDER — PEGFILGRASTIM-JMDB 6 MG/0.6ML ~~LOC~~ SOSY
6.0000 mg | PREFILLED_SYRINGE | Freq: Once | SUBCUTANEOUS | Status: AC
  Administered 2019-06-06: 6 mg via SUBCUTANEOUS

## 2019-06-06 NOTE — Patient Instructions (Signed)
[facility] Discharge Instructions for Patients Receiving Chemotherapy  Today you received the following chemotherapy agents   To help prevent nausea and vomiting after your treatment, we encourage you to take your nausea medication   If you develop nausea and vomiting that is not controlled by your nausea medication, call the clinic.   BELOW ARE SYMPTOMS THAT SHOULD BE REPORTED IMMEDIATELY:  *FEVER GREATER THAN 100.5 F  *CHILLS WITH OR WITHOUT FEVER  NAUSEA AND VOMITING THAT IS NOT CONTROLLED WITH YOUR NAUSEA MEDICATION  *UNUSUAL SHORTNESS OF BREATH  *UNUSUAL BRUISING OR BLEEDING  TENDERNESS IN MOUTH AND THROAT WITH OR WITHOUT PRESENCE OF ULCERS  *URINARY PROBLEMS  *BOWEL PROBLEMS  UNUSUAL RASH Items with * indicate a potential emergency and should be followed up as soon as possible.  Feel free to call the clinic should you have any questions or concerns. The clinic phone number is [phone].  Please show the CHEMO ALERT CARD at check-in to the Emergency Department and triage nurse.   

## 2019-06-06 NOTE — Progress Notes (Signed)
Mark Foster presents today  for pump d/c and flush. Portacath located in the left chest wall.  Clean, Dry and Intact Good blood return present. Portacath flushed with 19ml NS and 500U/68ml Heparin per protocol and needle removed intact. Procedure without incident. Patient tolerated procedure well.

## 2019-06-18 ENCOUNTER — Ambulatory Visit: Payer: PRIVATE HEALTH INSURANCE | Admitting: "Endocrinology

## 2019-06-18 ENCOUNTER — Inpatient Hospital Stay (HOSPITAL_COMMUNITY): Payer: PRIVATE HEALTH INSURANCE

## 2019-06-18 ENCOUNTER — Other Ambulatory Visit: Payer: Self-pay

## 2019-06-18 ENCOUNTER — Ambulatory Visit (HOSPITAL_COMMUNITY)
Admission: RE | Admit: 2019-06-18 | Discharge: 2019-06-18 | Disposition: A | Discharge status: Home / Self Care | Payer: PRIVATE HEALTH INSURANCE | Source: Ambulatory Visit | Attending: Hematology | Admitting: Hematology

## 2019-06-18 DIAGNOSIS — C259 Malignant neoplasm of pancreas, unspecified: Secondary | ICD-10-CM | POA: Diagnosis not present

## 2019-06-18 LAB — COMPREHENSIVE METABOLIC PANEL
ALT: 21 U/L (ref 0–44)
AST: 20 U/L (ref 15–41)
Albumin: 4.2 g/dL (ref 3.5–5.0)
Alkaline Phosphatase: 198 U/L — ABNORMAL HIGH (ref 38–126)
Anion gap: 9 (ref 5–15)
BUN: 10 mg/dL (ref 8–23)
CO2: 27 mmol/L (ref 22–32)
Calcium: 9.5 mg/dL (ref 8.9–10.3)
Chloride: 99 mmol/L (ref 98–111)
Creatinine, Ser: 0.68 mg/dL (ref 0.61–1.24)
GFR calc Af Amer: >60 mL/min (ref >60)
GFR calc non Af Amer: >60 mL/min (ref >60)
Glucose, Bld: 286 mg/dL — ABNORMAL HIGH (ref 70–99)
Potassium: 4.6 mmol/L (ref 3.5–5.1)
Sodium: 135 mmol/L (ref 135–145)
Total Bilirubin: 0.3 mg/dL (ref 0.3–1.2)
Total Protein: 7.2 g/dL (ref 6.5–8.1)

## 2019-06-18 LAB — CBC WITH DIFFERENTIAL/PLATELET
Abs Immature Granulocytes: 0.1 10*3/uL — ABNORMAL HIGH (ref 0.00–0.07)
Basophils Absolute: 0.1 10*3/uL (ref 0.0–0.1)
Basophils Relative: 1 %
Eosinophils Absolute: 0.2 10*3/uL (ref 0.0–0.5)
Eosinophils Relative: 1 %
HCT: 44 % (ref 39.0–52.0)
Hemoglobin: 14.6 g/dL (ref 13.0–17.0)
Immature Granulocytes: 1 %
Lymphocytes Relative: 15 %
Lymphs Abs: 2.6 10*3/uL (ref 0.7–4.0)
MCH: 32.4 pg (ref 26.0–34.0)
MCHC: 33.2 g/dL (ref 30.0–36.0)
MCV: 97.6 fL (ref 80.0–100.0)
Monocytes Absolute: 1.9 10*3/uL — ABNORMAL HIGH (ref 0.1–1.0)
Monocytes Relative: 11 %
Neutro Abs: 11.9 10*3/uL — ABNORMAL HIGH (ref 1.7–7.7)
Neutrophils Relative %: 71 %
Platelets: 319 10*3/uL (ref 150–400)
RBC: 4.51 MIL/uL (ref 4.22–5.81)
RDW: 17.2 % — ABNORMAL HIGH (ref 11.5–15.5)
WBC: 16.8 10*3/uL — ABNORMAL HIGH (ref 4.0–10.5)
nRBC: 0 % (ref 0.0–0.2)

## 2019-06-18 MED ORDER — IOHEXOL 300 MG/ML  SOLN
100.0000 mL | Freq: Once | INTRAMUSCULAR | Status: AC | PRN
  Administered 2019-06-18: 100 mL via INTRAVENOUS

## 2019-06-19 ENCOUNTER — Inpatient Hospital Stay (HOSPITAL_BASED_OUTPATIENT_CLINIC_OR_DEPARTMENT_OTHER): Payer: PRIVATE HEALTH INSURANCE | Admitting: Hematology

## 2019-06-19 ENCOUNTER — Encounter (HOSPITAL_COMMUNITY): Payer: Self-pay | Admitting: Hematology

## 2019-06-19 ENCOUNTER — Ambulatory Visit (HOSPITAL_COMMUNITY): Payer: PRIVATE HEALTH INSURANCE | Admitting: Hematology

## 2019-06-19 VITALS — BP 106/93 | HR 101 | Temp 97.7°F | Resp 18 | Wt 151.2 lb

## 2019-06-19 DIAGNOSIS — Z0000 Encounter for general adult medical examination without abnormal findings: Secondary | ICD-10-CM | POA: Diagnosis not present

## 2019-06-19 DIAGNOSIS — C259 Malignant neoplasm of pancreas, unspecified: Secondary | ICD-10-CM

## 2019-06-19 LAB — CANCER ANTIGEN 19-9: CA 19-9: 22 U/mL (ref 0–35)

## 2019-06-19 MED ORDER — INFLUENZA VAC SPLIT QUAD 0.5 ML IM SUSY
0.5000 mL | PREFILLED_SYRINGE | Freq: Once | INTRAMUSCULAR | Status: AC
  Administered 2019-06-19: 0.5 mL via INTRAMUSCULAR

## 2019-06-19 MED ORDER — INFLUENZA VAC SPLIT QUAD 0.5 ML IM SUSY
PREFILLED_SYRINGE | INTRAMUSCULAR | Status: AC
  Filled 2019-06-19: qty 0.5

## 2019-06-19 NOTE — Patient Instructions (Signed)
[place] at [facility] Discharge Instructions  You were seen today by Dr. Delton Coombes. He went over your recent results. He will see you back in 3 months for labs, scan and follow up.   Thank you for choosing [place] at [facility] to provide your oncology and hematology care.  To afford each patient quality time with our provider, please arrive at least 15 minutes before your scheduled appointment time.   If you have a lab appointment with the [place] please come in thru the  Main Entrance and check in at the main information desk  You need to re-schedule your appointment should you arrive 10 or more minutes late.  We strive to give you quality time with our providers, and arriving late affects you and other patients whose appointments are after yours.  Also, if you no show three or more times for appointments you may be dismissed from the clinic at the providers discretion.     Again, thank you for choosing [facility].  Our hope is that these requests will decrease the amount of time that you wait before being seen by our physicians.       _____________________________________________________________  Should you have questions after your visit to [facility], please contact our office at [phone] between the hours of 8:00 a.m. and 4:30 p.m.  Voicemails left after 4:00 p.m. will not be returned until the following business day.  For prescription refill requests, have your pharmacy contact our office and allow 72 hours.    Cancer Center Support Programs:   > Cancer Support Group  2nd Tuesday of the month 1pm-2pm, Journey Room

## 2019-06-19 NOTE — Progress Notes (Signed)
Mark Foster, [place] [address]   CLINIC:  Medical Oncology/Hematology  PCP:  Doree Albee, MD [place] [address] [phone]   REASON FOR VISIT:  Follow-up for pancreatic cancer      BRIEF ONCOLOGIC HISTORY:  Oncology History  Pancreatic adenocarcinoma ([place])  08/20/2018 Imaging   CT abdomen/pelvis w/ contrast: IMPRESSION: Atrophy and ductal dilatation involving the pancreatic tail, with suspected small soft tissue mass in the pancreatic body which could represent pancreatic carcinoma. Abdomen MRI and MRCP without and with contrast is recommended for further evaluation.  No evidence of hepatobiliary disease.   08/30/2018 Imaging   MRI abdomen w/ contrast: IMPRESSION: 1. Although not definitive, there remains concern of a small hypoenhancing mass at the junction of the pancreatic body and tail associated with atrophy and ductal dilatation in the pancreatic tail. This remains concerning for pancreatic neoplasm. Postinflammatory stricture less likely. Besides a tiny cystic lesion in the pancreatic tail, there are no other signs of previous pancreatitis. Further evaluation with endoscopic ultrasound for possible biopsy strongly recommended. 2. No evidence of metastatic disease. 3. Cholelithiasis without evidence of cholecystitis or biliary dilatation.   09/03/2018 Initial Diagnosis   Pancreatic adenocarcinoma ([place])   10/03/2018 Procedure   EUS: - 2.6cm irregularly shaped mass in the body of the pancreas that causing main pancreatic duct obstruction and dilation. The mass abuts the splenic vessels but no other significant vascular structures and it was sampled with trangastric EUS FNA. Preliminary cytology is + for malignancy, likely well-differentiated adenocarcinoma. It appears surgically resectable.   10/03/2018 Pathology Results   Accession: [id number]  FINE NEEDLE ASPIRATION, ENDOSCOPIC, PANCREAS BODY  (SPECIMEN 1 OF 1 COLLECTED 10/03/18): ADENOCARCINOMA.   10/17/2018 Imaging   PET: IMPRESSION: 1. Tiny focus of hypermetabolism identified in the body of pancreas, adjacent to the abrupt cut off of the main pancreatic duct. No evidence for hypermetabolic metastatic disease in the neck, chest, abdomen, or pelvis. 2. Cholelithiasis. 3.  Aortic Atherosclerois (ICD10-170.0) 4. 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  The Hereditary Gene Panel offered by [facility] includes sequencing and/or deletion duplication testing of the following 47 genes: APC, ATM, AXIN2, BARD1, BMPR1A, BRCA1, BRCA2, BRIP1, CDH1, CDK4, CDKN2A (p14ARF), CDKN2A (p16INK4a), CHEK2, CTNNA1, DICER1, EPCAM (Deletion/duplication testing only), GREM1 (promoter region deletion/duplication testing only), KIT, MEN1, MLH1, MSH2, MSH3, MSH6, MUTYH, NBN, NF1, NHTL1, PALB2, PDGFRA, PMS2, POLD1, POLE, PTEN, RAD50, RAD51C, RAD51D, SDHB, SDHC, SDHD, SMAD4, SMARCA4. STK11, TP53, TSC1, TSC2, and VHL.  The following genes were evaluated for sequence changes only: SDHA and HOXB13 c.251G>A variant only. The report date is November 12, 2018.   11/20/2018 Surgery   Distal subtotal pancreatectomy with splenectomy at [facility]   11/20/2018 Surgery   TUMOR   Tumor Site:  Pancreatic body    Histologic Type:  Ductal adenocarcinoma    Histologic Grade:  G1: Well differentiated    Tumor Size:  Greatest dimension in Centimeters (cm): 2.8 Centimeters (cm)    Additional Dimension in Centimeters (cm):  2.7 Centimeters (cm)    Additional Dimension in Centimeters (cm):  1.8 Centimeters (cm)   Tumor Extent:      Tumor Extension:  Tumor is confined to pancreas    Accessory Findings:      Treatment Effect:  No known presurgical therapy     Lymphovascular Invasion:  Not identified     Perineural Invasion:  Not identified  MARGINS   Margins:      Proximal  Pancreatic Parenchymal Margin:  Uninvolved by invasive carcinoma and pancreatic high-grade intraepithelial neoplasia      Distance of Invasive Carcinoma from Margin:  0.6 Centimeters (cm)   :      Other Margin:  Splenic margin.      Margin Status:  Uninvolved by invasive carcinoma   LYMPH NODES  Number of Lymph Nodes Involved:  2   Number of Lymph Nodes Examined:  16   PATHOLOGIC STAGE CLASSIFICATION (pTNM, AJCC 8th Edition)  TNM Descriptors:  Not applicable   Primary Tumor (pT):  pT2   Regional Lymph Nodes (pN):  pN1   ADDITIONAL FINDINGS  Additional Pathologic Findings:  Pancreatic intraepithelial neoplasia    Highest Grade (PanIN):  High grade PanIN is present.   Additional Pathologic Findings:  Benign unilocular pancreatic cyst (1.3 cm in greatest dimension) at the pancreatic tail.   12/30/2018 -  Chemotherapy   The patient had palonosetron (ALOXI) injection 0.25 mg, 0.25 mg, Intravenous,  Once, 12 of 12 cycles Administration: 0.25 mg (12/30/2018), 0.25 mg (01/13/2019), 0.25 mg (01/28/2019), 0.25 mg (02/11/2019), 0.25 mg (02/25/2019), 0.25 mg (03/11/2019), 0.25 mg (03/25/2019), 0.25 mg (04/09/2019), 0.25 mg (04/23/2019), 0.25 mg (05/07/2019), 0.25 mg (05/21/2019), 0.25 mg (06/04/2019) pegfilgrastim (NEULASTA) injection 6 mg, 6 mg, Subcutaneous, Once, 7 of 7 cycles Administration: 6 mg (01/01/2019), 6 mg (01/15/2019), 6 mg (01/30/2019), 6 mg (02/13/2019), 6 mg (02/27/2019), 6 mg (03/13/2019), 6 mg (03/27/2019) pegfilgrastim-jmdb (FULPHILA) injection 6 mg, 6 mg, Subcutaneous,  Once, 5 of 5 cycles Administration: 6 mg (04/11/2019), 6 mg (04/25/2019), 6 mg (05/09/2019), 6 mg (05/23/2019), 6 mg (06/06/2019) irinotecan (CAMPTOSAR) 280 mg in sodium chloride 0.9 % 500 mL chemo infusion, 150 mg/m2 = 280 mg (100 % of original dose 150 mg/m2), Intravenous,  Once, 12 of 12 cycles Dose modification: 150 mg/m2 (original dose 150 mg/m2, Cycle 1, Reason: Provider Judgment) Administration: 280  mg (12/30/2018), 280 mg (01/13/2019), 280 mg (01/28/2019), 280 mg (02/11/2019), 280 mg (02/25/2019), 280 mg (03/11/2019), 280 mg (03/25/2019), 280 mg (04/09/2019), 280 mg (04/23/2019), 280 mg (05/07/2019), 280 mg (05/21/2019), 280 mg (06/04/2019) leucovorin 700 mg in sodium chloride 0.9 % 250 mL infusion, 744 mg, Intravenous,  Once, 12 of 12 cycles Administration: 700 mg (12/30/2018), 700 mg (01/13/2019), 700 mg (01/28/2019), 700 mg (02/11/2019), 700 mg (02/25/2019), 700 mg (03/11/2019), 700 mg (03/25/2019), 700 mg (04/09/2019), 700 mg (04/23/2019), 700 mg (05/07/2019), 700 mg (05/21/2019), 700 mg (06/04/2019) oxaliplatin (ELOXATIN) 120 mg in dextrose 5 % 500 mL chemo infusion, 63.75 mg/m2 = 120 mg (75 % of original dose 85 mg/m2), Intravenous,  Once, 12 of 12 cycles Dose modification: 63.75 mg/m2 (75 % of original dose 85 mg/m2, Cycle 1, Reason: Other (see comments), Comment: high risk for worsening neuropathy), 42.5 mg/m2 (50 % of original dose 85 mg/m2, Cycle 11, Reason: Other (see comments), Comment: neuropathy) Administration: 120 mg (12/30/2018), 120 mg (01/13/2019), 120 mg (01/28/2019), 120 mg (02/11/2019), 120 mg (02/25/2019), 120 mg (03/11/2019), 120 mg (03/25/2019), 120 mg (04/09/2019), 120 mg (04/23/2019), 120 mg (05/07/2019), 80 mg (05/21/2019), 80 mg (06/04/2019) fosaprepitant (EMEND) 150 mg, dexamethasone (DECADRON) 12 mg in sodium chloride 0.9 % 145 mL IVPB, , Intravenous,  Once, 12 of 12 cycles Administration:  (12/30/2018),  (01/13/2019),  (01/28/2019),  (02/11/2019),  (02/25/2019),  (03/11/2019),  (03/25/2019),  (04/09/2019),  (04/23/2019),  (05/07/2019),  (05/21/2019),  (06/04/2019) fluorouracil (ADRUCIL) 4,450 mg in sodium chloride 0.9 % 61 mL chemo infusion, 2,400 mg/m2 = 4,450 mg,  Intravenous, 1 Day/Dose, 12 of 12 cycles Administration: 4,450 mg (12/30/2018), 4,450 mg (01/13/2019), 4,450 mg (01/28/2019), 4,450 mg (02/11/2019), 4,450 mg (02/25/2019), 4,450 mg (03/11/2019), 4,450 mg (03/25/2019), 4,450 mg (04/09/2019), 4,450 mg (04/23/2019), 4,450 mg (05/07/2019),  4,450 mg (05/21/2019), 4,450 mg (06/04/2019)  for chemotherapy treatment.       CANCER STAGING: Cancer Staging No matching staging information was found for the patient.   INTERVAL HISTORY:  Mr. Copado 63 y.o. male seen for follow-up of pancreatic cancer.  He completed 12 cycles of chemotherapy on 06/04/2019.  He reported mild improvement in appetite and energy levels.  He rates them at 75%.  Numbness in the bottom of the feet has been stable.  Occasional dizziness present.  He did develop some mouth sores after the last treatment which have healed completely.  He reports some balance issues.  Denies any sleep problems.  REVIEW OF SYSTEMS:  Review of Systems  Constitutional: Positive for fatigue.  Neurological: Positive for dizziness and numbness.  All other systems reviewed and are negative.    PAST MEDICAL/SURGICAL HISTORY:  Past Medical History:  Diagnosis Date   Cervical compression fracture (HCC)    Cervical spinal stenosis    Diabetic neuropathy (HCC)    Bilateral legs   Diverticulitis    Family history of pancreatic cancer    History of kidney stones    Hypothyroidism    Pancreatic cancer ([place])    Stenosis of left vertebral artery    Stroke [facility])    2011   Type 2 diabetes mellitus ([place])    Past Surgical History:  Procedure Laterality Date   APPENDECTOMY     COLON RESECTION     For diverticulitis   COLON SURGERY     ESOPHAGOGASTRODUODENOSCOPY N/A 10/03/2018   Procedure: ESOPHAGOGASTRODUODENOSCOPY (EGD);  Surgeon: [name], MD;  Location: [name];  Service: Endoscopy;  Laterality: N/A;   EUS N/A 10/03/2018   Procedure: UPPER ENDOSCOPIC ULTRASOUND (EUS) RADIAL;  Surgeon: [name], MD;  Location: [facility];  Service: Endoscopy;  Laterality: N/A;   FINE NEEDLE ASPIRATION N/A 10/03/2018   Procedure: FINE NEEDLE ASPIRATION (FNA) LINEAR;  Surgeon: [name], MD;  Location: [facility] END[facility];  Service: Endoscopy;  Laterality: N/A;     IR ANGIO INTRA EXTRACRAN SEL COM CAROTID INNOMINATE BILAT MOD SED  01/21/2018   IR ANGIO INTRA EXTRACRAN SEL COM CAROTID INNOMINATE UNI R MOD SED  03/21/2018   IR ANGIO VERTEBRAL SEL VERTEBRAL BILAT MOD SED  01/21/2018   IR TRANSCATH EXCRAN VERT OR CAR A STENT  03/21/2018   PORTACATH PLACEMENT Left 12/23/2018   Procedure: INSERTION PORT-A-CATH;  Surgeon: Virl Cagey, MD;  Location: [facility];  Service: General;  Laterality: Left;   RADIOLOGY WITH ANESTHESIA N/A 03/21/2018   Procedure: IR WITH ANESTHESIA WITH STENT PLACEMENT;  Surgeon: Luanne Bras, MD;  Location: [place];  Service: Radiology;  Laterality: N/A;   ROTATOR CUFF REPAIR     Left     SOCIAL HISTORY:  Social History   Socioeconomic History   Marital status: Single    Spouse name: Not on file   Number of children: 2   Years of education: Not on file   Highest education level: Not on file  Occupational History   Occupation: Estate agent  Social Needs   Financial resource strain: Somewhat hard   Food insecurity    Worry: Never true    Inability: Never true   Transportation needs    Medical: No  Non-medical: No  Tobacco Use   Smoking status: Never Smoker   Smokeless tobacco: Never Used  Substance and Sexual Activity   Alcohol use: Not Currently   Drug use: Never   Sexual activity: Not on file  Lifestyle   Physical activity    Days per week: 0 days    Minutes per session: 0 min   Stress: To some extent  Relationships   Social connections    Talks on phone: More than three times a week    Gets together: Once a week    Attends religious service: More than 4 times per year    Active member of club or organization: No    Attends meetings of clubs or organizations: Never    Relationship status: Divorced   Intimate partner violence    Fear of current or ex partner: No    Emotionally abused: No    Physically abused: No    Forced sexual activity: No  Other Topics Concern   Not  on file  Social History Narrative   Not on file    FAMILY HISTORY:  Family History  Problem Relation Age of Onset   Stroke Mother    Pancreatic cancer Father 65       d. 64   Stroke Maternal Grandmother    Heart attack Maternal Grandfather    Heart Problems Paternal Grandfather    Scoliosis Daughter    Muscular dystrophy Grandson     CURRENT MEDICATIONS:  Outpatient Encounter Medications as of 06/19/2019  Medication Sig   aspirin 81 MG tablet Take 1 tablet (81 mg total) by mouth daily.   atorvastatin (LIPITOR) 80 MG tablet Take 1 tablet (80 mg total) by mouth daily at 6 PM.   ECHINACEA EXTRACT PO Take 2 tablets by mouth daily.    FLUOROURACIL IV Inject into the vein every 14 (fourteen) days.   gabapentin (NEURONTIN) 300 MG capsule Take 1 capsule (300 mg total) by mouth 2 (two) times daily.   Insulin Glargine (BASAGLAR KWIKPEN) 100 UNIT/ML SOPN Inject 0.42 mLs (42 Units total) into the skin at bedtime.   Insulin Pen Needle 32G X 4 MM MISC 1 Device by Does not apply route daily.   IRINOTECAN HCL IV Inject into the vein every 14 (fourteen) days.   LEUCOVORIN CALCIUM IV Inject into the vein every 14 (fourteen) days.   NP THYROID 30 MG tablet Take 30 mg by mouth daily before breakfast.    Omega-3 1000 MG CAPS Take 1,000 mg by mouth daily.   OXALIPLATIN IV Inject into the vein every 14 (fourteen) days.   pantoprazole (PROTONIX) 20 MG tablet Take 20 mg by mouth daily.    sertraline (ZOLOFT) 50 MG tablet Take 50 mg by mouth every morning.    tamsulosin (FLOMAX) 0.4 MG CAPS capsule Take 0.4 mg by mouth daily.    ticagrelor (BRILINTA) 90 MG TABS tablet Take 1 tablet (90 mg total) by mouth 2 (two) times daily.   vitamin B-12 (CYANOCOBALAMIN) 1000 MCG tablet Take 1,000 mcg by mouth daily.   HYDROcodone-acetaminophen (NORCO/VICODIN) 5-325 MG tablet Take 1 tablet by mouth every 4 (four) hours as needed. (Patient not taking: Reported on 06/04/2019)    lidocaine-prilocaine (EMLA) cream Apply pea-sized amount to port-a-cath site and cover with plastic wrap one hour prior to chemotherapy appointments (Patient not taking: Reported on 06/04/2019)   loperamide (IMODIUM A-D) 2 MG tablet Take 1 tablet (2 mg total) by mouth as needed. Take 2 at diarrhea onset ,  then 1 every 2hr until 12hrs with no BM. May take 2 every 4hrs at night. If diarrhea recurs repeat. (Patient not taking: Reported on 06/04/2019)   meclizine (ANTIVERT) 25 MG tablet Take 1 tablet (25 mg total) by mouth 3 (three) times daily as needed for dizziness. (Patient not taking: Reported on 06/04/2019)   ondansetron (ZOFRAN) 8 MG tablet Take 1 tablet (8 mg total) by mouth every 6 (six) hours as needed for nausea or vomiting. (Patient not taking: Reported on 06/04/2019)   prochlorperazine (COMPAZINE) 10 MG tablet Take 1 tablet (10 mg total) by mouth every 6 (six) hours as needed (NAUSEA). (Patient not taking: Reported on 06/04/2019)   traMADol (ULTRAM) 50 MG tablet    [DISCONTINUED] dextrose 5 % SOLN 1,000 mL with fluorouracil 5 GM/100ML SOLN Inject into the vein over 48 hr.   Facility-Administered Encounter Medications as of 06/19/2019  Medication   0.9 %  sodium chloride infusion   0.9 %  sodium chloride infusion   dextrose 5 % solution   heparin lock flush 100 unit/mL   [COMPLETED] influenza vac split quadrivalent PF (FLUARIX) injection 0.5 mL   sodium chloride flush (NS) 0.9 % injection 10 mL   sodium chloride flush (NS) 0.9 % injection 10 mL    ALLERGIES:  Allergies  Allergen Reactions   Demerol [Meperidine Hcl] Other (See Comments)    convulsions     PHYSICAL EXAM:  ECOG Performance status: 1  Vitals:   06/19/19 1412  BP: (!) 106/93  Pulse: (!) 101  Resp: 18  Temp: 97.7 F (36.5 C)  SpO2: 99%   Filed Weights   06/19/19 1412  Weight: 151 lb 3.2 oz (68.6 kg)    Physical Exam Vitals signs reviewed.  Constitutional:      Appearance: Normal appearance.    Cardiovascular:     Rate and Rhythm: Normal rate and regular rhythm.     Heart sounds: Normal heart sounds.  Pulmonary:     Effort: Pulmonary effort is normal.     Breath sounds: Normal breath sounds.  Abdominal:     General: There is no distension.     Palpations: Abdomen is soft. There is no mass.  Musculoskeletal:        General: No swelling.  Skin:    General: Skin is warm.  Neurological:     General: No focal deficit present.     Mental Status: He is alert and oriented to person, place, and time.  Psychiatric:        Mood and Affect: Mood normal.        Behavior: Behavior normal.    Left ankle in a boot.  LABORATORY DATA:  I have reviewed the labs as listed.  CBC    Component Value Date/Time   WBC 16.8 (H) 06/18/2019 1523   RBC 4.51 06/18/2019 1523   HGB 14.6 06/18/2019 1523   HGB 14.9 12/11/2018 1018   HCT 44.0 06/18/2019 1523   PLT 319 06/18/2019 1523   PLT 335 12/11/2018 1018   MCV 97.6 06/18/2019 1523   MCH 32.4 06/18/2019 1523   MCHC 33.2 06/18/2019 1523   RDW 17.2 (H) 06/18/2019 1523   LYMPHSABS 2.6 06/18/2019 1523   MONOABS 1.9 (H) 06/18/2019 1523   EOSABS 0.2 06/18/2019 1523   BASOSABS 0.1 06/18/2019 1523   CMP Latest Ref Rng & Units 06/18/2019 06/04/2019 05/21/2019  Glucose 70 - 99 mg/dL 286(H) 198(H) 159(H)  BUN 8 - 23 mg/dL _0 Creatinine 0.61 -  1.24 mg/dL 0.68 0.52(L) 0.56(L)  Sodium 135 - 145 mmol/L 135 140 139  Potassium 3.5 - 5.1 mmol/L 4.6 3.3(L) 4.0  Chloride 98 - 111 mmol/L 99 105 107  CO2 22 - 32 mmol/L _0 Calcium 8.9 - 10.3 mg/dL 9.5 9.0 8.8(L)  Total Protein 6.5 - 8.1 g/dL 7.2 6.9 6.6  Total Bilirubin 0.3 - 1.2 mg/dL 0.3 0.7 0.6  Alkaline Phos 38 - 126 U/L 198(H) 208(H) 166(H)  AST 15 - 41 U/L _1 ALT 0 - 44 U/L _2 DIAGNOSTIC IMAGING:  I have independently reviewed the scans and discussed with the patient.      ASSESSMENT & PLAN:   Pancreatic adenocarcinoma (HCC) 1.  Stage IIb (T2N1)  pancreatic adenocarcinoma: -Status post distal pancreatectomy and splenectomy at [facility] by Dr. Zenia Resides on 11/20/2018. 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 -12 cycles of FOLFIRINOX from 12/30/2018 through 06/04/2019. -MRI of the brain on 05/19/2019 did not show any abnormalities.  This was done because of new onset numbness in the left lower lip during therapy. -His energy levels are getting back to normal. -We reviewed his CA 19-9 level from 06/18/2019 which was 22.  CT CAP showed postsurgical changes with no evidence of recurrence or metastatic disease. -I will see him back in 3 months for follow-up.  We will plan to repeat his CA 19-9 and a CT abdomen and pelvis prior to next visit.  2.  Post splenectomy state: -He received vaccinations prior to splenectomy.  We will follow-up on booster schedule.  3.  Diabetes: - He is continuing Basaglar 42 units daily.  This is managed by Dr. Dorris Fetch.  4.  Neuropathy: -He has some numbness in the balls of his toes.  There is also numbness left half of lower lip. -He will call us if he develops neuropathic pains.  Will consider gabapentin.   Total time spent is 25 minutes with more than 50% of the time spent face-to-face discussing and reinforcing treatment plan and coordination of care.  Orders placed this encounter:  Orders Placed This Encounter  Procedures   CT Abdomen Pelvis W Contrast   CBC with Differential/Platelet   Comprehensive metabolic panel   Cancer antigen 19-9      Derek Jack, MD [place] [phone]

## 2019-06-19 NOTE — Assessment & Plan Note (Signed)
1.  Stage IIb (T2N1) pancreatic adenocarcinoma: -Status post distal pancreatectomy and splenectomy at [facility] by Dr. Zenia Resides on 11/20/2018. 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 -12 cycles of FOLFIRINOX from 12/30/2018 through 06/04/2019. -MRI of the brain on 05/19/2019 did not show any abnormalities.  This was done because of new onset numbness in the left lower lip during therapy. -His energy levels are getting back to normal. -We reviewed his CA 19-9 level from 06/18/2019 which was 22.  CT CAP showed postsurgical changes with no evidence of recurrence or metastatic disease. -I will see him back in 3 months for follow-up.  We will plan to repeat his CA 19-9 and a CT abdomen and pelvis prior to next visit.  2.  Post splenectomy state: -He received vaccinations prior to splenectomy.  We will follow-up on booster schedule.  3.  Diabetes: - He is continuing Basaglar 42 units daily.  This is managed by Dr. Dorris Fetch.  4.  Neuropathy: -He has some numbness in the balls of his toes.  There is also numbness left half of lower lip. -He will call us if he develops neuropathic pains.  Will consider gabapentin.

## 2019-07-05 ENCOUNTER — Other Ambulatory Visit (INDEPENDENT_AMBULATORY_CARE_PROVIDER_SITE_OTHER): Payer: Self-pay | Admitting: Nurse Practitioner

## 2019-08-09 ENCOUNTER — Other Ambulatory Visit (INDEPENDENT_AMBULATORY_CARE_PROVIDER_SITE_OTHER): Payer: Self-pay | Admitting: Internal Medicine

## 2019-08-21 ENCOUNTER — Other Ambulatory Visit: Payer: Self-pay | Admitting: "Endocrinology

## 2019-09-08 ENCOUNTER — Ambulatory Visit: Payer: PRIVATE HEALTH INSURANCE | Admitting: Adult Health
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
## 2019-09-09 ENCOUNTER — Ambulatory Visit: Payer: PRIVATE HEALTH INSURANCE | Admitting: Adult Health

## 2019-09-19 ENCOUNTER — Ambulatory Visit (HOSPITAL_COMMUNITY)
Admission: RE | Admit: 2019-09-19 | Discharge: 2019-09-19 | Disposition: A | Discharge status: Home / Self Care | Payer: Medicare Other | Source: Ambulatory Visit | Attending: Hematology | Admitting: Hematology

## 2019-09-19 ENCOUNTER — Encounter (HOSPITAL_COMMUNITY): Payer: Self-pay

## 2019-09-19 ENCOUNTER — Other Ambulatory Visit: Payer: Self-pay

## 2019-09-19 ENCOUNTER — Inpatient Hospital Stay (HOSPITAL_COMMUNITY): Payer: Medicare Other | Attending: Hematology

## 2019-09-19 DIAGNOSIS — Z95828 Presence of other vascular implants and grafts: Secondary | ICD-10-CM

## 2019-09-19 DIAGNOSIS — Z90411 Acquired partial absence of pancreas: Secondary | ICD-10-CM | POA: Diagnosis not present

## 2019-09-19 DIAGNOSIS — Z7982 Long term (current) use of aspirin: Secondary | ICD-10-CM | POA: Diagnosis not present

## 2019-09-19 DIAGNOSIS — F17200 Nicotine dependence, unspecified, uncomplicated: Secondary | ICD-10-CM | POA: Insufficient documentation

## 2019-09-19 DIAGNOSIS — E039 Hypothyroidism, unspecified: Secondary | ICD-10-CM | POA: Insufficient documentation

## 2019-09-19 DIAGNOSIS — G479 Sleep disorder, unspecified: Secondary | ICD-10-CM | POA: Insufficient documentation

## 2019-09-19 DIAGNOSIS — Z79899 Other long term (current) drug therapy: Secondary | ICD-10-CM | POA: Diagnosis not present

## 2019-09-19 DIAGNOSIS — R42 Dizziness and giddiness: Secondary | ICD-10-CM | POA: Insufficient documentation

## 2019-09-19 DIAGNOSIS — M549 Dorsalgia, unspecified: Secondary | ICD-10-CM | POA: Insufficient documentation

## 2019-09-19 DIAGNOSIS — Z8673 Personal history of transient ischemic attack (TIA), and cerebral infarction without residual deficits: Secondary | ICD-10-CM | POA: Insufficient documentation

## 2019-09-19 DIAGNOSIS — E1140 Type 2 diabetes mellitus with diabetic neuropathy, unspecified: Secondary | ICD-10-CM | POA: Diagnosis not present

## 2019-09-19 DIAGNOSIS — C251 Malignant neoplasm of body of pancreas: Secondary | ICD-10-CM | POA: Diagnosis present

## 2019-09-19 DIAGNOSIS — C259 Malignant neoplasm of pancreas, unspecified: Secondary | ICD-10-CM

## 2019-09-19 DIAGNOSIS — K432 Incisional hernia without obstruction or gangrene: Secondary | ICD-10-CM | POA: Diagnosis not present

## 2019-09-19 DIAGNOSIS — Z9221 Personal history of antineoplastic chemotherapy: Secondary | ICD-10-CM | POA: Insufficient documentation

## 2019-09-19 DIAGNOSIS — Z452 Encounter for adjustment and management of vascular access device: Secondary | ICD-10-CM | POA: Diagnosis not present

## 2019-09-19 DIAGNOSIS — G8929 Other chronic pain: Secondary | ICD-10-CM | POA: Insufficient documentation

## 2019-09-19 LAB — CBC WITH DIFFERENTIAL/PLATELET
Abs Immature Granulocytes: 0.01 10*3/uL (ref 0.00–0.07)
Basophils Absolute: 0.1 10*3/uL (ref 0.0–0.1)
Basophils Relative: 1 %
Eosinophils Absolute: 0.2 10*3/uL (ref 0.0–0.5)
Eosinophils Relative: 3 %
HCT: 48.4 % (ref 39.0–52.0)
Hemoglobin: 16.9 g/dL (ref 13.0–17.0)
Immature Granulocytes: 0 %
Lymphocytes Relative: 31 %
Lymphs Abs: 1.9 10*3/uL (ref 0.7–4.0)
MCH: 32.3 pg (ref 26.0–34.0)
MCHC: 34.9 g/dL (ref 30.0–36.0)
MCV: 92.5 fL (ref 80.0–100.0)
Monocytes Absolute: 0.8 10*3/uL (ref 0.1–1.0)
Monocytes Relative: 14 %
Neutro Abs: 3.1 10*3/uL (ref 1.7–7.7)
Neutrophils Relative %: 51 %
Platelets: 232 10*3/uL (ref 150–400)
RBC: 5.23 MIL/uL (ref 4.22–5.81)
RDW: 13.9 % (ref 11.5–15.5)
WBC: 6 10*3/uL (ref 4.0–10.5)
nRBC: 0 % (ref 0.0–0.2)

## 2019-09-19 LAB — COMPREHENSIVE METABOLIC PANEL
ALT: 18 U/L (ref 0–44)
AST: 18 U/L (ref 15–41)
Albumin: 4.2 g/dL (ref 3.5–5.0)
Alkaline Phosphatase: 68 U/L (ref 38–126)
Anion gap: 10 (ref 5–15)
BUN: 25 mg/dL — ABNORMAL HIGH (ref 8–23)
CO2: 25 mmol/L (ref 22–32)
Calcium: 9.4 mg/dL (ref 8.9–10.3)
Chloride: 99 mmol/L (ref 98–111)
Creatinine, Ser: 0.55 mg/dL — ABNORMAL LOW (ref 0.61–1.24)
GFR calc Af Amer: >60 mL/min (ref >60)
GFR calc non Af Amer: >60 mL/min (ref >60)
Glucose, Bld: 162 mg/dL — ABNORMAL HIGH (ref 70–99)
Potassium: 3.7 mmol/L (ref 3.5–5.1)
Sodium: 134 mmol/L — ABNORMAL LOW (ref 135–145)
Total Bilirubin: 1.4 mg/dL — ABNORMAL HIGH (ref 0.3–1.2)
Total Protein: 7.6 g/dL (ref 6.5–8.1)

## 2019-09-19 MED ORDER — HEPARIN SOD (PORK) LOCK FLUSH 100 UNIT/ML IV SOLN
500.0000 [IU] | Freq: Once | INTRAVENOUS | Status: AC
  Administered 2019-09-19: 500 [IU] via INTRAVENOUS

## 2019-09-19 MED ORDER — IOHEXOL 300 MG/ML  SOLN
100.0000 mL | Freq: Once | INTRAMUSCULAR | Status: AC | PRN
  Administered 2019-09-19: 100 mL via INTRAVENOUS

## 2019-09-19 MED ORDER — SODIUM CHLORIDE 0.9% FLUSH
20.0000 mL | INTRAVENOUS | Status: DC | PRN
  Administered 2019-09-19: 10:00:00 20 mL via INTRAVENOUS

## 2019-09-19 NOTE — Progress Notes (Signed)
Mark Foster tolerated port lab draw well without complaints or incident. Port accessed with 20 gauge needle,blood drawn for labs ordered then flushed easily per protocol then de-accessed. VSS Pt discharged self ambulatory using his cane in satisfactory condition

## 2019-09-19 NOTE — Patient Instructions (Signed)
[facility] at [facility] Discharge Instructions  Labs drawn from portacath today. Follow-up as scheduled. Call clinic for any questions or concerns   Thank you for choosing [facility] at [facility] to provide your oncology and hematology care.  To afford each patient quality time with our provider, please arrive at least 15 minutes before your scheduled appointment time.   If you have a lab appointment with the Cancer Center please come in thru the Main Entrance and check in at the main information desk.  You need to re-schedule your appointment should you arrive 10 or more minutes late.  We strive to give you quality time with our providers, and arriving late affects you and other patients whose appointments are after yours.  Also, if you no show three or more times for appointments you may be dismissed from the clinic at the providers discretion.     Again, thank you for choosing [facility].  Our hope is that these requests will decrease the amount of time that you wait before being seen by our physicians.       _____________________________________________________________  Should you have questions after your visit to  Cancer Center, please contact our office at [phone] between the hours of 8:00 a.m. and 4:30 p.m.  Voicemails left after 4:00 p.m. will not be returned until the following business day.  For prescription refill requests, have your pharmacy contact our office and allow 72 hours.    Due to Covid, you will need to wear a mask upon entering the hospital. If you do not have a mask, a mask will be given to you at the Main Entrance upon arrival. For doctor visits, patients may have 1 support person with them. For treatment visits, patients can not have anyone with them due to social distancing guidelines and our immunocompromised population.     

## 2019-09-20 LAB — CANCER ANTIGEN 19-9: CA 19-9: 18 U/mL (ref 0–35)

## 2019-09-25 ENCOUNTER — Inpatient Hospital Stay (HOSPITAL_BASED_OUTPATIENT_CLINIC_OR_DEPARTMENT_OTHER): Payer: Medicare Other | Admitting: Hematology

## 2019-09-25 ENCOUNTER — Encounter (HOSPITAL_COMMUNITY): Payer: Self-pay | Admitting: Hematology

## 2019-09-25 ENCOUNTER — Other Ambulatory Visit (HOSPITAL_COMMUNITY): Payer: Self-pay | Admitting: *Deleted

## 2019-09-25 ENCOUNTER — Other Ambulatory Visit: Payer: Self-pay

## 2019-09-25 VITALS — BP 126/76 | HR 84 | Temp 97.1°F | Resp 18 | Wt 150.7 lb

## 2019-09-25 DIAGNOSIS — C259 Malignant neoplasm of pancreas, unspecified: Secondary | ICD-10-CM | POA: Diagnosis not present

## 2019-09-25 DIAGNOSIS — C251 Malignant neoplasm of body of pancreas: Secondary | ICD-10-CM | POA: Diagnosis not present

## 2019-09-25 NOTE — Patient Instructions (Addendum)
[place] at [facility] Discharge Instructions  You were seen today by Dr. Delton Coombes. He went over your recent lab and scan results. Everything looks good. He will repeat your scans prior to your next visit. He will see you back in 3 months for labs and follow up.   Thank you for choosing [place] at [facility] to provide your oncology and hematology care.  To afford each patient quality time with our provider, please arrive at least 15 minutes before your scheduled appointment time.   If you have a lab appointment with the [facility] please come in thru the  Main Entrance and check in at the main information desk  You need to re-schedule your appointment should you arrive 10 or more minutes late.  We strive to give you quality time with our providers, and arriving late affects you and other patients whose appointments are after yours.  Also, if you no show three or more times for appointments you may be dismissed from the clinic at the providers discretion.     Again, thank you for choosing [facility].  Our hope is that these requests will decrease the amount of time that you wait before being seen by our physicians.       _____________________________________________________________  Should you have questions after your visit to [facility], please contact our office at [phone] between the hours of 8:00 a.m. and 4:30 p.m.  Voicemails left after 4:00 p.m. will not be returned until the following business day.  For prescription refill requests, have your pharmacy contact our office and allow 72 hours.    Cancer Center Support Programs:   > Cancer Support Group  2nd Tuesday of the month 1pm-2pm, Journey Room

## 2019-09-25 NOTE — Progress Notes (Signed)
Mark Foster, [place] [address]   CLINIC:  Medical Oncology/Hematology  PCP:  Mark Albee, MD [place] [address] [phone]   REASON FOR VISIT:  Follow-up for pancreatic cancer      BRIEF ONCOLOGIC HISTORY:  Oncology History  Pancreatic adenocarcinoma ([place])  08/20/2018 Imaging   CT abdomen/pelvis w/ contrast: IMPRESSION: Atrophy and ductal dilatation involving the pancreatic tail, with suspected small soft tissue mass in the pancreatic body which could represent pancreatic carcinoma. Abdomen MRI and MRCP without and with contrast is recommended for further evaluation.  No evidence of hepatobiliary disease.   08/30/2018 Imaging   MRI abdomen w/ contrast: IMPRESSION: 1. Although not definitive, there remains concern of a small hypoenhancing mass at the junction of the pancreatic body and tail associated with atrophy and ductal dilatation in the pancreatic tail. This remains concerning for pancreatic neoplasm. Postinflammatory stricture less likely. Besides a tiny cystic lesion in the pancreatic tail, there are no other signs of previous pancreatitis. Further evaluation with endoscopic ultrasound for possible biopsy strongly recommended. 2. No evidence of metastatic disease. 3. Cholelithiasis without evidence of cholecystitis or biliary dilatation.   09/03/2018 Initial Diagnosis   Pancreatic adenocarcinoma ([place])   10/03/2018 Procedure   EUS: - 2.6cm irregularly shaped mass in the body of the pancreas that causing main pancreatic duct obstruction and dilation. The mass abuts the splenic vessels but no other significant vascular structures and it was sampled with trangastric EUS FNA. Preliminary cytology is + for malignancy, likely well-differentiated adenocarcinoma. It appears surgically resectable.   10/03/2018 Pathology Results   Accession: [id number]  FINE NEEDLE ASPIRATION, ENDOSCOPIC, PANCREAS BODY  (SPECIMEN 1 OF 1 COLLECTED 10/03/18): ADENOCARCINOMA.   10/17/2018 Imaging   PET: IMPRESSION: 1. Tiny focus of hypermetabolism identified in the body of pancreas, adjacent to the abrupt cut off of the main pancreatic duct. No evidence for hypermetabolic metastatic disease in the neck, chest, abdomen, or pelvis. 2. Cholelithiasis. 3.  Aortic Atherosclerois (ICD10-170.0) 4. 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  The Hereditary Gene Panel offered by [facility] includes sequencing and/or deletion duplication testing of the following 47 genes: APC, ATM, AXIN2, BARD1, BMPR1A, BRCA1, BRCA2, BRIP1, CDH1, CDK4, CDKN2A (p14ARF), CDKN2A (p16INK4a), CHEK2, CTNNA1, DICER1, EPCAM (Deletion/duplication testing only), GREM1 (promoter region deletion/duplication testing only), KIT, MEN1, MLH1, MSH2, MSH3, MSH6, MUTYH, NBN, NF1, NHTL1, PALB2, PDGFRA, PMS2, POLD1, POLE, PTEN, RAD50, RAD51C, RAD51D, SDHB, SDHC, SDHD, SMAD4, SMARCA4. STK11, TP53, TSC1, TSC2, and VHL.  The following genes were evaluated for sequence changes only: SDHA and HOXB13 c.251G>A variant only. The report date is November 12, 2018.   11/20/2018 Surgery   Distal subtotal pancreatectomy with splenectomy at [facility]   11/20/2018 Surgery   TUMOR   Tumor Site:  Pancreatic body    Histologic Type:  Ductal adenocarcinoma    Histologic Grade:  G1: Well differentiated    Tumor Size:  Greatest dimension in Centimeters (cm): 2.8 Centimeters (cm)    Additional Dimension in Centimeters (cm):  2.7 Centimeters (cm)    Additional Dimension in Centimeters (cm):  1.8 Centimeters (cm)   Tumor Extent:      Tumor Extension:  Tumor is confined to pancreas    Accessory Findings:      Treatment Effect:  No known presurgical therapy     Lymphovascular Invasion:  Not identified     Perineural Invasion:  Not identified  MARGINS   Margins:      Proximal  Pancreatic Parenchymal Margin:  Uninvolved by invasive carcinoma and pancreatic high-grade intraepithelial neoplasia      Distance of Invasive Carcinoma from Margin:  0.6 Centimeters (cm)   :      Other Margin:  Splenic margin.      Margin Status:  Uninvolved by invasive carcinoma   LYMPH NODES  Number of Lymph Nodes Involved:  2   Number of Lymph Nodes Examined:  16   PATHOLOGIC STAGE CLASSIFICATION (pTNM, AJCC 8th Edition)  TNM Descriptors:  Not applicable   Primary Tumor (pT):  pT2   Regional Lymph Nodes (pN):  pN1   ADDITIONAL FINDINGS  Additional Pathologic Findings:  Pancreatic intraepithelial neoplasia    Highest Grade (PanIN):  High grade PanIN is present.   Additional Pathologic Findings:  Benign unilocular pancreatic cyst (1.3 cm in greatest dimension) at the pancreatic tail.   12/30/2018 -  Chemotherapy   The patient had palonosetron (ALOXI) injection 0.25 mg, 0.25 mg, Intravenous,  Once, 12 of 12 cycles Administration: 0.25 mg (12/30/2018), 0.25 mg (01/13/2019), 0.25 mg (01/28/2019), 0.25 mg (02/11/2019), 0.25 mg (02/25/2019), 0.25 mg (03/11/2019), 0.25 mg (03/25/2019), 0.25 mg (04/09/2019), 0.25 mg (04/23/2019), 0.25 mg (05/07/2019), 0.25 mg (05/21/2019), 0.25 mg (06/04/2019) pegfilgrastim (NEULASTA) injection 6 mg, 6 mg, Subcutaneous, Once, 7 of 7 cycles Administration: 6 mg (01/01/2019), 6 mg (01/15/2019), 6 mg (01/30/2019), 6 mg (02/13/2019), 6 mg (02/27/2019), 6 mg (03/13/2019), 6 mg (03/27/2019) pegfilgrastim-jmdb (FULPHILA) injection 6 mg, 6 mg, Subcutaneous,  Once, 5 of 5 cycles Administration: 6 mg (04/11/2019), 6 mg (04/25/2019), 6 mg (05/09/2019), 6 mg (05/23/2019), 6 mg (06/06/2019) irinotecan (CAMPTOSAR) 280 mg in sodium chloride 0.9 % 500 mL chemo infusion, 150 mg/m2 = 280 mg (100 % of original dose 150 mg/m2), Intravenous,  Once, 12 of 12 cycles Dose modification: 150 mg/m2 (original dose 150 mg/m2, Cycle 1, Reason: Provider Judgment) Administration: 280  mg (12/30/2018), 280 mg (01/13/2019), 280 mg (01/28/2019), 280 mg (02/11/2019), 280 mg (02/25/2019), 280 mg (03/11/2019), 280 mg (03/25/2019), 280 mg (04/09/2019), 280 mg (04/23/2019), 280 mg (05/07/2019), 280 mg (05/21/2019), 280 mg (06/04/2019) leucovorin 700 mg in sodium chloride 0.9 % 250 mL infusion, 744 mg, Intravenous,  Once, 12 of 12 cycles Administration: 700 mg (12/30/2018), 700 mg (01/13/2019), 700 mg (01/28/2019), 700 mg (02/11/2019), 700 mg (02/25/2019), 700 mg (03/11/2019), 700 mg (03/25/2019), 700 mg (04/09/2019), 700 mg (04/23/2019), 700 mg (05/07/2019), 700 mg (05/21/2019), 700 mg (06/04/2019) oxaliplatin (ELOXATIN) 120 mg in dextrose 5 % 500 mL chemo infusion, 63.75 mg/m2 = 120 mg (75 % of original dose 85 mg/m2), Intravenous,  Once, 12 of 12 cycles Dose modification: 63.75 mg/m2 (75 % of original dose 85 mg/m2, Cycle 1, Reason: Other (see comments), Comment: high risk for worsening neuropathy), 42.5 mg/m2 (50 % of original dose 85 mg/m2, Cycle 11, Reason: Other (see comments), Comment: neuropathy) Administration: 120 mg (12/30/2018), 120 mg (01/13/2019), 120 mg (01/28/2019), 120 mg (02/11/2019), 120 mg (02/25/2019), 120 mg (03/11/2019), 120 mg (03/25/2019), 120 mg (04/09/2019), 120 mg (04/23/2019), 120 mg (05/07/2019), 80 mg (05/21/2019), 80 mg (06/04/2019) fosaprepitant (EMEND) 150 mg, dexamethasone (DECADRON) 12 mg in sodium chloride 0.9 % 145 mL IVPB, , Intravenous,  Once, 12 of 12 cycles Administration:  (12/30/2018),  (01/13/2019),  (01/28/2019),  (02/11/2019),  (02/25/2019),  (03/11/2019),  (03/25/2019),  (04/09/2019),  (04/23/2019),  (05/07/2019),  (05/21/2019),  (06/04/2019) fluorouracil (ADRUCIL) 4,450 mg in sodium chloride 0.9 % 61 mL chemo infusion, 2,400 mg/m2 = 4,450 mg,  Intravenous, 1 Day/Dose, 12 of 12 cycles Administration: 4,450 mg (12/30/2018), 4,450 mg (01/13/2019), 4,450 mg (01/28/2019), 4,450 mg (02/11/2019), 4,450 mg (02/25/2019), 4,450 mg (03/11/2019), 4,450 mg (03/25/2019), 4,450 mg (04/09/2019), 4,450 mg (04/23/2019), 4,450 mg (05/07/2019),  4,450 mg (05/21/2019), 4,450 mg (06/04/2019)  for chemotherapy treatment.       CANCER STAGING: Cancer Staging No matching staging information was found for the patient.   INTERVAL HISTORY:  Mr. Mark Foster 64 y.o. male seen for follow-up of pancreatic cancer.  Denies any abdominal pains.  Reports developing incisional hernia in the last 2 months.  Appetite and energy levels are 50%.  Chronic pain in the back has been stable and rated as 4 out of 10.  Occasional numbness in the left hand and bottom of his feet.  REVIEW OF SYSTEMS:  Review of Systems  Neurological: Positive for dizziness and numbness.  Psychiatric/Behavioral: Positive for sleep disturbance.  All other systems reviewed and are negative.    PAST MEDICAL/SURGICAL HISTORY:  Past Medical History:  Diagnosis Date  . Cervical compression fracture ([place])   . Cervical spinal stenosis   . Diabetic neuropathy ([facility])    Bilateral legs  . Diverticulitis   . Family history of pancreatic cancer   . History of kidney stones   . Hypothyroidism   . Pancreatic cancer ([place])   . Stenosis of left vertebral artery   . Stroke [facility])    2011  . Type 2 diabetes mellitus ([place])    Past Surgical History:  Procedure Laterality Date  . APPENDECTOMY    . COLON RESECTION     For diverticulitis  . COLON SURGERY    . ESOPHAGOGASTRODUODENOSCOPY N/A 10/03/2018   Procedure: ESOPHAGOGASTRODUODENOSCOPY (EGD);  Surgeon: [name], MD;  Location: [name];  Service: Endoscopy;  Laterality: N/A;  . EUS N/A 10/03/2018   Procedure: UPPER ENDOSCOPIC ULTRASOUND (EUS) RADIAL;  Surgeon: [name], MD;  Location: [facility] ENDOSCOPY;  Service: Endoscopy;  Laterality: N/A;  . FINE NEEDLE ASPIRATION N/A 10/03/2018   Procedure: FINE NEEDLE ASPIRATION (FNA) LINEAR;  Surgeon: [name], MD;  Location: [facility] ENDOSCOPY;  Service: Endoscopy;  Laterality: N/A;  . IR ANGIO INTRA EXTRACRAN SEL COM CAROTID INNOMINATE BILAT MOD SED  01/21/2018  . IR ANGIO  INTRA EXTRACRAN SEL COM CAROTID INNOMINATE UNI R MOD SED  03/21/2018  . IR ANGIO VERTEBRAL SEL VERTEBRAL BILAT MOD SED  01/21/2018  . IR TRANSCATH EXCRAN VERT OR CAR A STENT  03/21/2018  . PORTACATH PLACEMENT Left 12/23/2018   Procedure: INSERTION PORT-A-CATH;  Surgeon: Virl Cagey, MD;  Location: [facility];  Service: General;  Laterality: Left;  . RADIOLOGY WITH ANESTHESIA N/A 03/21/2018   Procedure: IR WITH ANESTHESIA WITH STENT PLACEMENT;  Surgeon: Luanne Bras, MD;  Location: [facility];  Service: Radiology;  Laterality: N/A;  . ROTATOR CUFF REPAIR     Left     SOCIAL HISTORY:  Social History   Socioeconomic History  . Marital status: Single    Spouse name: Not on file  . Number of children: 2  . Years of education: Not on file  . Highest education level: Not on file  Occupational History  . Occupation: Estate agent  Tobacco Use  . Smoking status: Current Every Day Smoker  . Smokeless tobacco: Never Used  Substance and Sexual Activity  . Alcohol use: Not Currently  . Drug use: Never  . Sexual activity: Not on file  Other Topics Concern  . Not on file  Social History  Narrative  . Not on file   Social Determinants of Health   Financial Resource Strain: Medium Risk  . Difficulty of Paying Living Expenses: Somewhat hard  Food Insecurity: No Food Insecurity  . Worried About Charity fundraiser in the Last Year: Never true  . Ran Out of Food in the Last Year: Never true  Transportation Needs: No Transportation Needs  . Lack of Transportation (Medical): No  . Lack of Transportation (Non-Medical): No  Physical Activity: Inactive  . Days of Exercise per Week: 0 days  . Minutes of Exercise per Session: 0 min  Stress: Stress Concern Present  . Feeling of Stress : To some extent  Social Connections: Somewhat Isolated  . Frequency of Communication with Friends and Family: More than three times a week  . Frequency of Social Gatherings with Friends and Family: Once a week   . Attends Religious Services: More than 4 times per year  . Active Member of Clubs or Organizations: No  . Attends Archivist Meetings: Never  . Marital Status: Divorced  Human resources officer Violence: Not At Risk  . Fear of Current or Ex-Partner: No  . Emotionally Abused: No  . Physically Abused: No  . Sexually Abused: No    FAMILY HISTORY:  Family History  Problem Relation Age of Onset  . Stroke Mother   . Pancreatic cancer Father 80       d. 17  . Stroke Maternal Grandmother   . Heart attack Maternal Grandfather   . Heart Problems Paternal Grandfather   . Scoliosis Daughter   . Muscular dystrophy Grandson     CURRENT MEDICATIONS:  Outpatient Encounter Medications as of 09/25/2019  Medication Sig Note  . aspirin 81 MG tablet Take 1 tablet (81 mg total) by mouth daily.   Marland Kitchen atorvastatin (LIPITOR) 80 MG tablet TAKE 1 TABLET(80 MG) BY MOUTH DAILY AT 6 PM   . ECHINACEA EXTRACT PO Take 2 tablets by mouth daily.    Marland Kitchen gabapentin (NEURONTIN) 300 MG capsule Take 1 capsule (300 mg total) by mouth 2 (two) times daily. 09/30/2019: Taking as needed  . Insulin Glargine (BASAGLAR KWIKPEN) 100 UNIT/ML SOPN INJECT 42 UNITS INTO THE SKIN AT BEDTIME   . Insulin Pen Needle 32G X 4 MM MISC 1 Device by Does not apply route daily.   . NP THYROID 30 MG tablet Take 30 mg by mouth daily before breakfast.    . Omega-3 1000 MG CAPS Take 1,000 mg by mouth daily.   . sertraline (ZOLOFT) 50 MG tablet TAKE 1 TABLET BY MOUTH DAILY 09/30/2019: Think he needs increase now  . tamsulosin (FLOMAX) 0.4 MG CAPS capsule Take 0.4 mg by mouth daily.    . vitamin B-12 (CYANOCOBALAMIN) 1000 MCG tablet Take 1,000 mcg by mouth daily.   . [DISCONTINUED] ticagrelor (BRILINTA) 90 MG TABS tablet Take 1 tablet (90 mg total) by mouth 2 (two) times daily. 09/30/2019: Couldn't get refill; neuro MD.  . loperamide (IMODIUM A-D) 2 MG tablet Take 1 tablet (2 mg total) by mouth as needed. Take 2 at diarrhea onset , then 1 every 2hr  until 12hrs with no BM. May take 2 every 4hrs at night. If diarrhea recurs repeat.   . meclizine (ANTIVERT) 25 MG tablet Take 1 tablet (25 mg total) by mouth 3 (three) times daily as needed for dizziness.   . ondansetron (ZOFRAN) 8 MG tablet Take 1 tablet (8 mg total) by mouth every 6 (six) hours as needed for nausea  or vomiting.   . pantoprazole (PROTONIX) 20 MG tablet Take 20 mg by mouth as needed.    . [DISCONTINUED] atorvastatin (LIPITOR) 80 MG tablet Take 1 tablet (80 mg total) by mouth daily at 6 PM.   . [DISCONTINUED] FLUOROURACIL IV Inject into the vein every 14 (fourteen) days.   . [DISCONTINUED] HYDROcodone-acetaminophen (NORCO/VICODIN) 5-325 MG tablet Take 1 tablet by mouth every 4 (four) hours as needed. (Patient not taking: Reported on 06/04/2019)   . [DISCONTINUED] IRINOTECAN HCL IV Inject into the vein every 14 (fourteen) days.   . [DISCONTINUED] LEUCOVORIN CALCIUM IV Inject into the vein every 14 (fourteen) days.   . [DISCONTINUED] lidocaine-prilocaine (EMLA) cream Apply pea-sized amount to port-a-cath site and cover with plastic wrap one hour prior to chemotherapy appointments (Patient not taking: Reported on 06/04/2019)   . [DISCONTINUED] OXALIPLATIN IV Inject into the vein every 14 (fourteen) days.   . [DISCONTINUED] prochlorperazine (COMPAZINE) 10 MG tablet Take 1 tablet (10 mg total) by mouth every 6 (six) hours as needed (NAUSEA). (Patient not taking: Reported on 06/04/2019)   . [DISCONTINUED] traMADol (ULTRAM) 50 MG tablet Take 50 mg by mouth every 6 (six) hours as needed.     Facility-Administered Encounter Medications as of 09/25/2019  Medication  . 0.9 %  sodium chloride infusion  . 0.9 %  sodium chloride infusion  . dextrose 5 % solution  . heparin lock flush 100 unit/mL  . sodium chloride flush (NS) 0.9 % injection 10 mL  . sodium chloride flush (NS) 0.9 % injection 10 mL    ALLERGIES:  Allergies  Allergen Reactions  . Demerol [Meperidine Hcl] Other (See Comments)      convulsions     PHYSICAL EXAM:  ECOG Performance status: 1  Vitals:   09/25/19 1534  BP: 126/76  Pulse: 84  Resp: 18  Temp: (!) 97.1 F (36.2 C)  SpO2: 99%   Filed Weights   09/25/19 1534  Weight: 150 lb 11.2 oz (68.4 kg)    Physical Exam Vitals reviewed.  Constitutional:      Appearance: Normal appearance.  Cardiovascular:     Rate and Rhythm: Normal rate and regular rhythm.     Heart sounds: Normal heart sounds.  Pulmonary:     Effort: Pulmonary effort is normal.     Breath sounds: Normal breath sounds.  Abdominal:     General: There is no distension.     Palpations: Abdomen is soft. There is no mass.  Musculoskeletal:        General: No swelling.  Skin:    General: Skin is warm.  Neurological:     General: No focal deficit present.     Mental Status: He is alert and oriented to person, place, and time.  Psychiatric:        Mood and Affect: Mood normal.        Behavior: Behavior normal.      LABORATORY DATA:  I have reviewed the labs as listed.  CBC    Component Value Date/Time   WBC 6.0 09/19/2019 1017   RBC 5.23 09/19/2019 1017   HGB 16.9 09/19/2019 1017   HGB 14.9 12/11/2018 1018   HCT 48.4 09/19/2019 1017   PLT 232 09/19/2019 1017   PLT 335 12/11/2018 1018   MCV 92.5 09/19/2019 1017   MCH 32.3 09/19/2019 1017   MCHC 34.9 09/19/2019 1017   RDW 13.9 09/19/2019 1017   LYMPHSABS 1.9 09/19/2019 1017   MONOABS 0.8 09/19/2019 1017   EOSABS  0.2 09/19/2019 1017   BASOSABS 0.1 09/19/2019 1017   CMP Latest Ref Rng & Units 09/30/2019 09/19/2019 06/18/2019  Glucose 65 - 99 mg/dL 276(H) 162(H) 286(H)  BUN 7 - 25 mg/dL 14 25(H) 10  Creatinine 0.70 - 1.25 mg/dL 0.81 0.55(L) 0.68  Sodium 135 - 146 mmol/L 136 134(L) 135  Potassium 3.5 - 5.3 mmol/L 4.9 3.7 4.6  Chloride 98 - 110 mmol/L 99 99 99  CO2 20 - 32 mmol/L _0 Calcium 8.6 - 10.3 mg/dL 10.0 9.4 9.5  Total Protein 6.1 - 8.1 g/dL 7.3 7.6 7.2  Total Bilirubin 0.2 - 1.2 mg/dL 0.7 1.4(H) 0.3   Alkaline Phos 38 - 126 U/L - 68 198(H)  AST 10 - 35 U/L _1 ALT 9 - 46 U/L _2 DIAGNOSTIC IMAGING:  I have independently reviewed the scans and discussed with the patient.      ASSESSMENT & PLAN:   Pancreatic adenocarcinoma (HCC) 1.  Stage IIb (T2N1) pancreatic adenocarcinoma: -Status post distal pancreatectomy and splenectomy at [facility] by Dr. Zenia Resides on 11/20/2018. 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 -12 cycles of FOLFIRINOX from 12/30/2018 through 06/04/2019. -MRI of the brain on 05/19/2019 did not show any abnormalities.  This was done because of new onset numbness in the left lower lip during therapy. -Denies any abdominal pains.  He has developed incisional hernia at the resection site about 2 months back.  It is self reducing when he lies down.  He denies any abdominal pain. -We reviewed his labs which are grossly within normal limits.  CA 19-9 is normal. -We reviewed CT of the abdomen and pelvis which shows stable exam compared to prior scan from October 2020. -I will see him back in 3 months for follow-up with repeat scan and labs.  2.  Post splenectomy state: -He received vaccinations prior to splenectomy.  We will follow-up on booster schedule.  3.  Diabetes: - He is continuing Basaglar 42 units daily.  This is managed by Dr. Dorris Fetch.  4.  Neuropathy: -He has on and off numbness in the balls of his toes and left hand.  No medication needed at this time.     Orders placed this encounter:  Orders Placed This Encounter  Procedures  . CT Chest W Contrast  . CT Abdomen Pelvis W Contrast  . CBC with Differential/Platelet  . Comprehensive metabolic panel  . Cancer antigen 19-9      Derek Jack, MD [place] [phone]

## 2019-09-27 MED ORDER — TRAMADOL HCL 50 MG PO TABS: 50.0000 mg | ORAL_TABLET | Freq: Four times a day (QID) | ORAL | 2 refills | Status: DC | PRN

## 2019-09-30 ENCOUNTER — Other Ambulatory Visit: Payer: Self-pay

## 2019-09-30 ENCOUNTER — Encounter (INDEPENDENT_AMBULATORY_CARE_PROVIDER_SITE_OTHER): Payer: Self-pay | Admitting: Internal Medicine

## 2019-09-30 ENCOUNTER — Ambulatory Visit (INDEPENDENT_AMBULATORY_CARE_PROVIDER_SITE_OTHER): Payer: Medicare Other | Admitting: Internal Medicine

## 2019-09-30 VITALS — BP 131/77 | HR 83 | Temp 97.3°F | Resp 18 | Ht 70.0 in | Wt 153.0 lb

## 2019-09-30 DIAGNOSIS — I10 Essential (primary) hypertension: Secondary | ICD-10-CM

## 2019-09-30 DIAGNOSIS — E039 Hypothyroidism, unspecified: Secondary | ICD-10-CM | POA: Diagnosis not present

## 2019-09-30 DIAGNOSIS — E559 Vitamin D deficiency, unspecified: Secondary | ICD-10-CM

## 2019-09-30 DIAGNOSIS — E1165 Type 2 diabetes mellitus with hyperglycemia: Secondary | ICD-10-CM | POA: Diagnosis not present

## 2019-09-30 NOTE — Progress Notes (Signed)
Metrics: Intervention Frequency ACO  Documented Smoking Status Yearly  Screened one or more times in 24 months  Cessation Counseling or  Active cessation medication Past 24 months  Past 24 months   Guideline developer: UpToDate (See UpToDate for funding source) Date Released: 2014       Wellness Office Visit  Subjective:  Patient ID: Mark Foster, male    DOB: October 21, 1955  Age: 64 y.o. MRN: [id number]  CC: This man comes in for follow-up of his diabetes,  vitamin D deficiency and hypothyroidism.  We have not seen him for a long time because he had been involved with treatment of his pancreatic cancer. HPI  He was seen by oncology in October of last year and is CA 19-19 level is in the normal range.  He is going to continue with follow-up. He has not seen endocrinology for several months also.  He continues on medications for his diabetes. He also continues with desiccated NP thyroid for his hypothyroidism. Past Medical History:  Diagnosis Date  . Cervical compression fracture ([place])   . Cervical spinal stenosis   . Diabetic neuropathy ([facility])    Bilateral legs  . Diverticulitis   . Family history of pancreatic cancer   . History of kidney stones   . Hypothyroidism   . Pancreatic cancer ([place])   . Stenosis of left vertebral artery   . Stroke [facility])    2011  . Type 2 diabetes mellitus ([facility])       Family History  Problem Relation Age of Onset  . Stroke Mother   . Pancreatic cancer Father 36       d. 68  . Stroke Maternal Grandmother   . Heart attack Maternal Grandfather   . Heart Problems Paternal Grandfather   . Scoliosis Daughter   . Muscular dystrophy Grandson     Social History   Social History Narrative  . Not on file   Social History   Tobacco Use  . Smoking status: Current Every Day Smoker  . Smokeless tobacco: Never Used  Substance Use Topics  . Alcohol use: Not Currently    Current Meds  Medication Sig  . aspirin 81 MG tablet Take 1 tablet (81 mg  total) by mouth daily.  Marland Kitchen atorvastatin (LIPITOR) 80 MG tablet TAKE 1 TABLET(80 MG) BY MOUTH DAILY AT 6 PM  . ECHINACEA EXTRACT PO Take 2 tablets by mouth daily.   Marland Kitchen gabapentin (NEURONTIN) 300 MG capsule Take 1 capsule (300 mg total) by mouth 2 (two) times daily.  . Insulin Glargine (BASAGLAR KWIKPEN) 100 UNIT/ML SOPN INJECT 42 UNITS INTO THE SKIN AT BEDTIME  . Insulin Pen Needle 32G X 4 MM MISC 1 Device by Does not apply route daily.  Marland Kitchen loperamide (IMODIUM A-D) 2 MG tablet Take 1 tablet (2 mg total) by mouth as needed. Take 2 at diarrhea onset , then 1 every 2hr until 12hrs with no BM. May take 2 every 4hrs at night. If diarrhea recurs repeat.  . meclizine (ANTIVERT) 25 MG tablet Take 1 tablet (25 mg total) by mouth 3 (three) times daily as needed for dizziness.  . NP THYROID 30 MG tablet Take 30 mg by mouth daily before breakfast.   . Omega-3 1000 MG CAPS Take 1,000 mg by mouth daily.  . ondansetron (ZOFRAN) 8 MG tablet Take 1 tablet (8 mg total) by mouth every 6 (six) hours as needed for nausea or vomiting.  . pantoprazole (PROTONIX) 20 MG tablet Take 20 mg by mouth  as needed.   . sertraline (ZOLOFT) 50 MG tablet TAKE 1 TABLET BY MOUTH DAILY  . tamsulosin (FLOMAX) 0.4 MG CAPS capsule Take 0.4 mg by mouth daily.   . traMADol (ULTRAM) 50 MG tablet Take 1 tablet (50 mg total) by mouth every 6 (six) hours as needed.  . vitamin B-12 (CYANOCOBALAMIN) 1000 MCG tablet Take 1,000 mcg by mouth daily.      Objective:   Today's Vitals: BP 131/77 (BP Location: Right Arm, Patient Position: Sitting, Cuff Size: Normal)   Pulse 83   Temp (!) 97.3 F (36.3 C) (Temporal)   Resp 18   Ht 5\' 10"  (1.778 m)   Wt 153 lb (69.4 kg)   SpO2 98% Comment: wearing mask.  BMI 21.95 kg/m  Vitals with BMI 09/30/2019 09/25/2019 09/19/2019  Height 5\' 10"  - -  Weight 153 lbs 150 lbs 11 oz -  BMI 99991111 - -  Systolic [id number]  Diastolic 77 76 72  Pulse 83 84 80     Physical Exam    He is somewhat cachectic  but his weight has been stable.  Blood pressure is reasonable.  He is alert and orientated without any new focal neurological signs.   Assessment   1. Essential hypertension, benign   2. Uncontrolled type 2 diabetes mellitus with hyperglycemia ([place])   3. Acquired hypothyroidism   4. Vitamin D deficiency disease       Tests ordered Orders Placed This Encounter  Procedures  . COMPLETE METABOLIC PANEL WITH GFR  . Hemoglobin A1c  . VITAMIN D 25 Hydroxy (Vit-D Deficiency, Fractures)  . T3, free  . TSH     Plan: 1. Blood work is ordered as above. 2. His blood pressure stable without medications. 3. Continue with insulin as before and we will check hemoglobin A1c. 4. Continue with desiccated NP thyroid and we will check thyroid levels. 5. In the past vitamin D levels were low and he has not been taking any vitamin D3 supplementation I will check levels again. 6. Further recommendations will depend on blood results and I will have him follow-up with Sarah in about 3 months time.   No orders of the defined types were placed in this encounter.   Doree Albee, MD

## 2019-10-01 LAB — COMPLETE METABOLIC PANEL WITH GFR
AG Ratio: 1.7 (calc) (ref 1.0–2.5)
ALT: 11 U/L (ref 9–46)
AST: 12 U/L (ref 10–35)
Albumin: 4.6 g/dL (ref 3.6–5.1)
Alkaline phosphatase (APISO): 68 U/L (ref 35–144)
BUN: 14 mg/dL (ref 7–25)
CO2: 20 mmol/L (ref 20–32)
Calcium: 10 mg/dL (ref 8.6–10.3)
Chloride: 99 mmol/L (ref 98–110)
Creat: 0.81 mg/dL (ref 0.70–1.25)
GFR, Est African American: 109 mL/min/{1.73_m2} (ref >=60)
GFR, Est Non African American: 94 mL/min/{1.73_m2} (ref >=60)
Globulin: 2.7 g/dL (calc) (ref 1.9–3.7)
Glucose, Bld: 276 mg/dL — ABNORMAL HIGH (ref 65–99)
Potassium: 4.9 mmol/L (ref 3.5–5.3)
Sodium: 136 mmol/L (ref 135–146)
Total Bilirubin: 0.7 mg/dL (ref 0.2–1.2)
Total Protein: 7.3 g/dL (ref 6.1–8.1)

## 2019-10-01 LAB — TSH: TSH: 1.41 mIU/L (ref 0.40–4.50)

## 2019-10-01 LAB — HEMOGLOBIN A1C
Hgb A1c MFr Bld: 12.6 % of total Hgb — ABNORMAL HIGH (ref <5.7)
Mean Plasma Glucose: 315 (calc)
eAG (mmol/L): 17.4 (calc)

## 2019-10-01 LAB — VITAMIN D 25 HYDROXY (VIT D DEFICIENCY, FRACTURES): Vit D, 25-Hydroxy: 11 ng/mL — ABNORMAL LOW (ref 30–100)

## 2019-10-01 LAB — T3, FREE: T3, Free: 2.3 pg/mL (ref 2.3–4.2)

## 2019-10-05 NOTE — Assessment & Plan Note (Signed)
1.  Stage IIb (T2N1) pancreatic adenocarcinoma: -Status post distal pancreatectomy and splenectomy at [facility] by Dr. Zenia Resides on 11/20/2018. 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 -12 cycles of FOLFIRINOX from 12/30/2018 through 06/04/2019. -MRI of the brain on 05/19/2019 did not show any abnormalities.  This was done because of new onset numbness in the left lower lip during therapy. -Denies any abdominal pains.  He has developed incisional hernia at the resection site about 2 months back.  It is self reducing when he lies down.  He denies any abdominal pain. -We reviewed his labs which are grossly within normal limits.  CA 19-9 is normal. -We reviewed CT of the abdomen and pelvis which shows stable exam compared to prior scan from October 2020. -I will see him back in 3 months for follow-up with repeat scan and labs.  2.  Post splenectomy state: -He received vaccinations prior to splenectomy.  We will follow-up on booster schedule.  3.  Diabetes: - He is continuing Basaglar 42 units daily.  This is managed by Dr. Dorris Fetch.  4.  Neuropathy: -He has on and off numbness in the balls of his toes and left hand.  No medication needed at this time.

## 2019-10-06 NOTE — Progress Notes (Signed)
Return call to notify that lab results was in his mychart app account. He will see his instructions from Dr Anastasio Champion there. If he has any question or concerns he could send directly to there.

## 2019-10-08 ENCOUNTER — Emergency Department: Payer: Medicare Other

## 2019-10-08 ENCOUNTER — Other Ambulatory Visit: Payer: Self-pay

## 2019-10-08 ENCOUNTER — Inpatient Hospital Stay
Admission: EM | Admit: 2019-10-08 | Discharge: 2019-10-10 | DRG: 065 | Disposition: A | Discharge status: Home Health | Payer: Medicare Other | Attending: Internal Medicine | Admitting: Internal Medicine

## 2019-10-08 DIAGNOSIS — R4020 Unspecified coma: Secondary | ICD-10-CM | POA: Diagnosis not present

## 2019-10-08 DIAGNOSIS — Z87442 Personal history of urinary calculi: Secondary | ICD-10-CM

## 2019-10-08 DIAGNOSIS — Z7982 Long term (current) use of aspirin: Secondary | ICD-10-CM

## 2019-10-08 DIAGNOSIS — R29700 NIHSS score 0: Secondary | ICD-10-CM | POA: Diagnosis present

## 2019-10-08 DIAGNOSIS — Z79899 Other long term (current) drug therapy: Secondary | ICD-10-CM

## 2019-10-08 DIAGNOSIS — I639 Cerebral infarction, unspecified: Secondary | ICD-10-CM | POA: Diagnosis present

## 2019-10-08 DIAGNOSIS — F17200 Nicotine dependence, unspecified, uncomplicated: Secondary | ICD-10-CM | POA: Diagnosis present

## 2019-10-08 DIAGNOSIS — I63433 Cerebral infarction due to embolism of bilateral posterior cerebral arteries: Principal | ICD-10-CM | POA: Diagnosis present

## 2019-10-08 DIAGNOSIS — Z79891 Long term (current) use of opiate analgesic: Secondary | ICD-10-CM

## 2019-10-08 DIAGNOSIS — I6522 Occlusion and stenosis of left carotid artery: Secondary | ICD-10-CM | POA: Diagnosis present

## 2019-10-08 DIAGNOSIS — E119 Type 2 diabetes mellitus without complications: Secondary | ICD-10-CM | POA: Diagnosis not present

## 2019-10-08 DIAGNOSIS — E1140 Type 2 diabetes mellitus with diabetic neuropathy, unspecified: Secondary | ICD-10-CM | POA: Diagnosis present

## 2019-10-08 DIAGNOSIS — E039 Hypothyroidism, unspecified: Secondary | ICD-10-CM

## 2019-10-08 DIAGNOSIS — I10 Essential (primary) hypertension: Secondary | ICD-10-CM | POA: Diagnosis not present

## 2019-10-08 DIAGNOSIS — Z823 Family history of stroke: Secondary | ICD-10-CM

## 2019-10-08 DIAGNOSIS — E782 Mixed hyperlipidemia: Secondary | ICD-10-CM

## 2019-10-08 DIAGNOSIS — Z9081 Acquired absence of spleen: Secondary | ICD-10-CM

## 2019-10-08 DIAGNOSIS — C7931 Secondary malignant neoplasm of brain: Secondary | ICD-10-CM | POA: Diagnosis present

## 2019-10-08 DIAGNOSIS — E559 Vitamin D deficiency, unspecified: Secondary | ICD-10-CM | POA: Diagnosis present

## 2019-10-08 DIAGNOSIS — Z8673 Personal history of transient ischemic attack (TIA), and cerebral infarction without residual deficits: Secondary | ICD-10-CM

## 2019-10-08 DIAGNOSIS — Z800 Family history of malignant neoplasm of digestive organs: Secondary | ICD-10-CM

## 2019-10-08 DIAGNOSIS — Z794 Long term (current) use of insulin: Secondary | ICD-10-CM

## 2019-10-08 DIAGNOSIS — Z20822 Contact with and (suspected) exposure to covid-19: Secondary | ICD-10-CM | POA: Diagnosis present

## 2019-10-08 DIAGNOSIS — Z9114 Patient's other noncompliance with medication regimen: Secondary | ICD-10-CM

## 2019-10-08 DIAGNOSIS — Z9221 Personal history of antineoplastic chemotherapy: Secondary | ICD-10-CM

## 2019-10-08 DIAGNOSIS — C259 Malignant neoplasm of pancreas, unspecified: Secondary | ICD-10-CM | POA: Diagnosis present

## 2019-10-08 DIAGNOSIS — D6869 Other thrombophilia: Secondary | ICD-10-CM | POA: Diagnosis present

## 2019-10-08 DIAGNOSIS — R55 Syncope and collapse: Secondary | ICD-10-CM

## 2019-10-08 DIAGNOSIS — Z8249 Family history of ischemic heart disease and other diseases of the circulatory system: Secondary | ICD-10-CM

## 2019-10-08 DIAGNOSIS — Z885 Allergy status to narcotic agent status: Secondary | ICD-10-CM

## 2019-10-08 LAB — CBC
HCT: 42.9 % (ref 39.0–52.0)
Hemoglobin: 14.9 g/dL (ref 13.0–17.0)
MCH: 31.6 pg (ref 26.0–34.0)
MCHC: 34.7 g/dL (ref 30.0–36.0)
MCV: 91.1 fL (ref 80.0–100.0)
Platelets: 215 10*3/uL (ref 150–400)
RBC: 4.71 MIL/uL (ref 4.22–5.81)
RDW: 14.6 % (ref 11.5–15.5)
WBC: 9.2 10*3/uL (ref 4.0–10.5)
nRBC: 0 % (ref 0.0–0.2)

## 2019-10-08 LAB — COMPREHENSIVE METABOLIC PANEL
ALT: 14 U/L (ref 0–44)
AST: 18 U/L (ref 15–41)
Albumin: 3.7 g/dL (ref 3.5–5.0)
Alkaline Phosphatase: 56 U/L (ref 38–126)
Anion gap: 9 (ref 5–15)
BUN: 21 mg/dL (ref 8–23)
CO2: 26 mmol/L (ref 22–32)
Calcium: 8.7 mg/dL — ABNORMAL LOW (ref 8.9–10.3)
Chloride: 102 mmol/L (ref 98–111)
Creatinine, Ser: 0.7 mg/dL (ref 0.61–1.24)
GFR calc Af Amer: >60 mL/min (ref >60)
GFR calc non Af Amer: >60 mL/min (ref >60)
Glucose, Bld: 251 mg/dL — ABNORMAL HIGH (ref 70–99)
Potassium: 3.5 mmol/L (ref 3.5–5.1)
Sodium: 137 mmol/L (ref 135–145)
Total Bilirubin: 0.8 mg/dL (ref 0.3–1.2)
Total Protein: 6.4 g/dL — ABNORMAL LOW (ref 6.5–8.1)

## 2019-10-08 LAB — TROPONIN I (HIGH SENSITIVITY): Troponin I (High Sensitivity): 4 ng/L (ref <18)

## 2019-10-08 NOTE — ED Notes (Signed)
Pt in mri 

## 2019-10-08 NOTE — H&P (Signed)
Mark Foster [id number] DOB: 11-20-1955 DOA: 10/08/2019     PCP: Doree Albee, MD   Outpatient Specialists:     Oncology  Dr. Delton Coombes [facility]    Patient arrived to ER on 10/08/19 at [facility]  Patient coming from: home Lives   With family    Chief Complaint:   Chief Complaint  Patient presents with  . Loss of Consciousness    HPI: Mark Foster is a 64 y.o. male with medical history significant of DM2.  Vitamin D deficiency pancreatic cancer and hypothyroidism, diabetic neuropathy, stroke in 2011 with stenosis of left vertebral artery, HTN    Presented with   syncopal episode lasted 2 minutes while at the bar.  Patient was lethargic and disoriented afterwards. He put down his jacket and then woke up several minutes on the floor.  Family was at the side. By time patient arrived to emergency department he was alert and oriented.  No pain or discomfort no chest pain no numbness no headache no abdominal pain He continues to smoke but states drinks only occasionally  States for years she been having worsening in balance.  Which has gotten somewhat worse lately.  He usually walks with a cane but forgot it today  Infectious risk factors:  Reports none   In  ER  COVID TEST  NEGATIVE  Lab Results  Component Value Date   [place] NEGATIVE 10/08/2019     Regarding pertinent Chronic problems:   History of pancreatic cancer followed by oncology Status post distal pancreatectomy and splenectomy at [facility] by Dr. Zenia Resides on 11/20/2018.  Currently stable  History of diverticulitis requiring colon resection  Hyperlipidemia -   on statins Lipitor   HTN not on any medications        DM 2 -  Lab Results  Component Value Date   HGBA1C 12.6 (H) 09/30/2019    on insulin,    Hypothyroidism:  Lab Results  Component Value Date   TSH 1.41 09/30/2019   on NP thyroid      Hx of CVA -  with/out residual deficits on Aspirin 81 mg used to be on Brilinta but could not get  refills    BPH - on Flomax      While in ER: CT scanning head showed age intermediate small vessel infarcts MRI showed multifocal lesions representing acute to subacute infarcts in right temporal lobe subacute infarct versus metastatic lesion    The following Work up has been ordered so far:  Orders Placed This Encounter  Procedures  . Respiratory Panel by RT PCR (Flu A&B, Covid) - Nasopharyngeal Swab  . CT Head Wo Contrast  . MR BRAIN WO CONTRAST  . CBC  . Comprehensive metabolic panel  . Urinalysis, Complete w Microscopic  . Consult to hospitalist  ALL PATIENTS BEING ADMITTED/HAVING PROCEDURES NEED COVID-19 SCREENING  . EKG 12-Lead  . ED EKG     Following Medications were ordered in ER: Medications - No data to display      Consult Orders  (From admission, onward)         Start     Ordered   10/08/19 2326  Consult to hospitalist  ALL PATIENTS BEING ADMITTED/HAVING PROCEDURES NEED COVID-19 SCREENING  Once    Comments: ALL PATIENTS BEING ADMITTED/HAVING PROCEDURES NEED COVID-19 SCREENING  Provider:  (Not yet assigned)  Question Answer Comment  Place call to: [facility]   Reason for Consult Admit   Diagnosis/Clinical Info for Consult: CVA  10/08/19 2325          Significant initial  Findings: Abnormal Labs Reviewed  COMPREHENSIVE METABOLIC PANEL - Abnormal; Notable for the following components:      Result Value   Glucose, Bld 251 (*)    Calcium 8.7 (*)    Total Protein 6.4 (*)    All other components within normal limits    Otherwise labs showing:    Recent Labs  Lab 10/08/19 2009  NA 137  K 3.5  CO2 26  GLUCOSE 251*  BUN 21  CREATININE 0.70  CALCIUM 8.7*    Cr    stable,    Lab Results  Component Value Date   CREATININE 0.70 10/08/2019   CREATININE 0.81 09/30/2019   CREATININE 0.55 (L) 09/19/2019    Recent Labs  Lab 10/08/19 2009  AST 18  ALT 14  ALKPHOS 56  BILITOT 0.8  PROT 6.4*  ALBUMIN 3.7   Lab Results  Component Value  Date   CALCIUM 8.7 (L) 10/08/2019     WBC      Component Value Date/Time   WBC 9.2 10/08/2019 2009   ANC    Component Value Date/Time   NEUTROABS 3.1 09/19/2019 1017   ALC No components found for: LYMPHAB    Plt: Lab Results  Component Value Date   PLT 215 10/08/2019    Lactic Acid, Venous    Component Value Date/Time   LATICACIDVEN 1.4 11/11/2018 2043      COVID-19 Labs  No results for input(s): DDIMER, FERRITIN, LDH, CRP in the last 72 hours.  Lab Results  Component Value Date   [place] NEGATIVE 10/08/2019     HG/HCT  stable,      Component Value Date/Time   HGB 14.9 10/08/2019 2009   HGB 14.9 12/11/2018 1018   HCT 42.9 10/08/2019 2009      ECG: Ordered Personally reviewed by me showing: HR : 96 Rhythm:  NSR,   no evidence of ischemic changes QTC 460  DM  labs:  HbA1C: Recent Labs    11/12/18 0109 02/25/19 0805 09/30/19 1113  HGBA1C 9.0* 10.8* 12.6*       CBG (last 3)  No results for input(s): GLUCAP in the last 72 hours.     UA  ordered      Ordered  CT HEAD multiple indeterminate duration CVA  CXR -   NON acute  MRI confirmed in multiple areas of infarction there is one area worrisome for metastases   ED Triage Vitals  Enc Vitals Group     BP 10/08/19 2004 115/75     Pulse Rate 10/08/19 2000 94     Resp 10/08/19 2000 15     Temp 10/08/19 2004 97.8 F (36.6 C)     Temp src --      SpO2 10/08/19 2000 96 %     Weight 10/08/19 2001 150 lb (68 kg)     Height 10/08/19 2001 5\' 10"  (1.778 m)     Head Circumference --      Peak Flow --      Pain Score 10/08/19 2000 0     Pain Loc --      Pain Edu? --      Excl. in [place]? --   TMAX(24)@       Latest  Blood pressure 115/75, pulse 99, temperature 97.8 F (36.6 C), resp. rate 14, height 5\' 10"  (1.778 m), weight 68 kg, SpO2 95 %.     Hospitalist  was called for admission for CVA in the setting of syncope versus seizure with abnormal MRI   Review of Systems:    Pertinent  positives include: fall , syncope ataxia  Constitutional:  No weight loss, night sweats, Fevers, chills, fatigue, weight loss  HEENT:  No headaches, Difficulty swallowing,Tooth/dental problems,Sore throat,  No sneezing, itching, ear ache, nasal congestion, post nasal drip,  Cardio-vascular:  No chest pain, Orthopnea, PND, anasarca, dizziness, palpitations.no Bilateral lower extremity swelling  GI:  No heartburn, indigestion, abdominal pain, nausea, vomiting, diarrhea, change in bowel habits, loss of appetite, melena, blood in stool, hematemesis Resp:  no shortness of breath at rest. No dyspnea on exertion, No excess mucus, no productive cough, No non-productive cough, No coughing up of blood.No change in color of mucus.No wheezing. Skin:  no rash or lesions. No jaundice GU:  no dysuria, change in color of urine, no urgency or frequency. No straining to urinate.  No flank pain.  Musculoskeletal:  No joint pain or no joint swelling. No decreased range of motion. No back pain.  Psych:  No change in mood or affect. No depression or anxiety. No memory loss.  Neuro: no localizing neurological complaints, no tingling, no weakness, no double vision, no gait abnormality, no slurred speech, no confusion  All systems reviewed and apart from [place] all are negative  Past Medical History:   Past Medical History:  Diagnosis Date  . Cervical compression fracture ([place])   . Cervical spinal stenosis   . Diabetic neuropathy (HCC)    Bilateral legs  . Diverticulitis   . Family history of pancreatic cancer   . History of kidney stones   . Hypothyroidism   . Pancreatic cancer ([place])   . Stenosis of left vertebral artery   . Stroke [facility])    2011  . Type 2 diabetes mellitus ([place])        Past Surgical History:  Procedure Laterality Date  . APPENDECTOMY    . COLON RESECTION     For diverticulitis  . COLON SURGERY    . ESOPHAGOGASTRODUODENOSCOPY N/A 10/03/2018   Procedure:  ESOPHAGOGASTRODUODENOSCOPY (EGD);  Surgeon: [name], MD;  Location: [name];  Service: Endoscopy;  Laterality: N/A;  . EUS N/A 10/03/2018   Procedure: UPPER ENDOSCOPIC ULTRASOUND (EUS) RADIAL;  Surgeon: [name], MD;  Location: [facility];  Service: Endoscopy;  Laterality: N/A;  . FINE NEEDLE ASPIRATION N/A 10/03/2018   Procedure: FINE NEEDLE ASPIRATION (FNA) LINEAR;  Surgeon: [name], MD;  Location: [facility];  Service: Endoscopy;  Laterality: N/A;  . IR ANGIO INTRA EXTRACRAN SEL COM CAROTID INNOMINATE BILAT MOD SED  01/21/2018  . IR ANGIO INTRA EXTRACRAN SEL COM CAROTID INNOMINATE UNI R MOD SED  03/21/2018  . IR ANGIO VERTEBRAL SEL VERTEBRAL BILAT MOD SED  01/21/2018  . IR TRANSCATH EXCRAN VERT OR CAR A STENT  03/21/2018  . PORTACATH PLACEMENT Left 12/23/2018   Procedure: INSERTION PORT-A-CATH;  Surgeon: Virl Cagey, MD;  Location: [facility];  Service: General;  Laterality: Left;  . RADIOLOGY WITH ANESTHESIA N/A 03/21/2018   Procedure: IR WITH ANESTHESIA WITH STENT PLACEMENT;  Surgeon: Luanne Bras, MD;  Location: [facility];  Service: Radiology;  Laterality: N/A;  . ROTATOR CUFF REPAIR     Left    Social History:  Ambulatory cane      reports that he has been smoking. He has never used smokeless tobacco. He reports previous alcohol use. He reports that he does not use drugs.  Family History:   Family History  Problem Relation Age of Onset  . Stroke Mother   . Pancreatic cancer Father 9       d. 95  . Stroke Maternal Grandmother   . Heart attack Maternal Grandfather   . Heart Problems Paternal Grandfather   . Scoliosis Daughter   . Muscular dystrophy Grandson     Allergies: Allergies  Allergen Reactions  . Demerol [Meperidine Hcl] Other (See Comments)    convulsions     Prior to Admission medications   Medication Sig Start Date End Date Taking? Authorizing Provider  aspirin 81 MG tablet Take 1 tablet (81 mg total) by mouth daily.  03/22/18   Louk, Alexandra M, PA-C  atorvastatin (LIPITOR) 80 MG tablet TAKE 1 TABLET(80 MG) BY MOUTH DAILY AT 6 PM 08/09/19   Gosrani, Nimish C, MD  ECHINACEA EXTRACT PO Take 2 tablets by mouth daily.     [provider]  gabapentin (NEURONTIN) 300 MG capsule Take 1 capsule (300 mg total) by mouth 2 (two) times daily. 01/13/19   Frann Rider, NP  Insulin Glargine (BASAGLAR KWIKPEN) 100 UNIT/ML SOPN INJECT 42 UNITS INTO THE SKIN AT BEDTIME 08/21/19   Cassandria Anger, MD  Insulin Pen Needle 32G X 4 MM MISC 1 Device by Does not apply route daily. 01/22/18   Donzetta Starch, NP  loperamide (IMODIUM A-D) 2 MG tablet Take 1 tablet (2 mg total) by mouth as needed. Take 2 at diarrhea onset , then 1 every 2hr until 12hrs with no BM. May take 2 every 4hrs at night. If diarrhea recurs repeat. 12/19/18   Derek Jack, MD  meclizine (ANTIVERT) 25 MG tablet Take 1 tablet (25 mg total) by mouth 3 (three) times daily as needed for dizziness. 04/23/19   Derek Jack, MD  NP THYROID 30 MG tablet Take 30 mg by mouth daily before breakfast.  01/17/18   [provider]  Omega-3 1000 MG CAPS Take 1,000 mg by mouth daily.    [provider]  ondansetron (ZOFRAN) 8 MG tablet Take 1 tablet (8 mg total) by mouth every 6 (six) hours as needed for nausea or vomiting. 04/11/19   Roger Shelter, FNP  pantoprazole (PROTONIX) 20 MG tablet Take 20 mg by mouth as needed.  11/25/18 11/25/19  [provider]  sertraline (ZOLOFT) 50 MG tablet TAKE 1 TABLET BY MOUTH DAILY 07/05/19   Ailene Ards, NP  tamsulosin (FLOMAX) 0.4 MG CAPS capsule Take 0.4 mg by mouth daily.  10/01/18   [provider]  traMADol (ULTRAM) 50 MG tablet Take 1 tablet (50 mg total) by mouth every 6 (six) hours as needed. 09/27/19   Derek Jack, MD  vitamin B-12 (CYANOCOBALAMIN) 1000 MCG tablet Take 1,000 mcg by mouth daily.    [provider]  atorvastatin (LIPITOR) 80 MG tablet Take 1 tablet  (80 mg total) by mouth daily at 6 PM. 05/15/19   Doree Albee, MD   Physical Exam: Blood pressure 115/75, pulse 99, temperature 97.8 F (36.6 C), resp. rate 14, height 5\' 10"  (1.778 m), weight 68 kg, SpO2 95 %. 1. General:  in No Acute distress  Chronically ill-appearing 2. Psychological: Alert and  Oriented 3. Head/ENT:    Dry Mucous Membranes                          Head Non traumatic, neck supple  Poor Dentition 4. SKIN:   decreased Skin turgor,  Skin clean Dry and intact no rash 5. Heart: Regular rate and rhythm no Murmur, no Rub or gallop 6. Lungs:  , no wheezes or crackles   7. Abdomen: Soft,  non-tender, Non distended   8. Lower extremities: no clubbing, cyanosis, no edema 9. Neurologically  strength slightly diminished in left lower extremity grips appear to be equal  cranial nerves II through XII intact 10. MSK: Normal range of motion   All other LABS:     Recent Labs  Lab 10/08/19 2009  WBC 9.2  HGB 14.9  HCT 42.9  MCV 91.1  PLT 215     Recent Labs  Lab 10/08/19 2009  NA 137  K 3.5  CL 102  CO2 26  GLUCOSE 251*  BUN 21  CREATININE 0.70  CALCIUM 8.7*     Recent Labs  Lab 10/08/19 2009  AST 18  ALT 14  ALKPHOS 56  BILITOT 0.8  PROT 6.4*  ALBUMIN 3.7       Cultures: No results found for: SDES, SPECREQUEST, CULT, REPTSTATUS   Radiological Exams on Admission: CT Head Wo Contrast  Result Date: 10/08/2019 CLINICAL DATA:  Syncope EXAM: CT HEAD WITHOUT CONTRAST TECHNIQUE: Contiguous axial images were obtained from the base of the skull through the vertex without intravenous contrast. COMPARISON:  04/03/2019 FINDINGS: Brain: There is no acute intracranial hemorrhage, mass-effect, or edema. There is no new loss of gray differentiation. Left parietal chronic infarct again noted. New age-indeterminate small vessel infarcts of the left caudate and left cerebellum. Additional patchy hypoattenuation in the supratentorial white  matter is nonspecific but may reflect minor chronic microvascular ischemic changes. There is no extra-axial fluid collection. Ventricles and sulci are within normal limits in size and configuration. Vascular: There is atherosclerotic calcification at the skull base. Skull: Calvarium is unremarkable. Sinuses/Orbits: Mild ethmoid sinus mucosal thickening. Orbits are unremarkable. Other: Mastoid air cells are clear. IMPRESSION: No acute intracranial hemorrhage or mass effect. New age-indeterminate small vessel infarcts of the left caudate and left cerebellum. Chronic left parietal infarct. Electronically Signed   By: Macy Mis M.D.   On: 10/08/2019 20:42   MR BRAIN WO CONTRAST  Result Date: 10/08/2019 CLINICAL DATA:  Stroke follow-up. Syncope. EXAM: MRI HEAD WITHOUT CONTRAST TECHNIQUE: Multiplanar, multiecho pulse sequences of the brain and surrounding structures were obtained without intravenous contrast. COMPARISON:  Brain MRI 05/19/2019 FINDINGS: Brain: There are punctate foci of diffusion restriction within the bilateral frontal white matter. There is an area of milder diffusion abnormality within the posterior right parietal lobe. Area of encephalomalacia along the left postcentral gyrus is unchanged. There is a new focus of hyperintense T2-weighted signal within the right temporal lobe (series 15 image 21). Small focus of hemosiderin deposition in the left cerebellum. Vascular: Unchanged abnormal left internal carotid artery flow void. Skull and upper cervical spine:  Normal bone marrow signal. Sinuses/Orbits: Small amount of left mastoid fluid. Normal orbits. Other: IMPRESSION: 1. Multifocal abnormal diffusion restriction within the bilateral frontal white matter and within the posterior right parietal lobe, likely indicating acute to subacute infarcts. 2. New focus of hyperintense T2-weighted signal within the right temporal lobe, which may be a late subacute infarct. However, a small metastatic lesion  could also have this appearance. Further imaging with intravenous contrast might be helpful for further evaluation. 3. Unchanged abnormal left internal carotid artery flow void, consistent with occlusion. 4. Unchanged encephalomalacia along the left postcentral gyrus. Electronically Signed  By: Ulyses Jarred M.D.   On: 10/08/2019 23:16    Chart has been reviewed    Assessment/Plan   64 y.o. male with medical history significant of DM2.  Vitamin D deficiency pancreatic cancer and hypothyroidism, diabetic neuropathy, stroke in 2011 with stenosis of left vertebral artery, HTN  Admitted for CVA in the setting of syncope versus seizure with abnormal MRI  Present on Admission: . CVA (cerebral vascular accident) ([place]) -  - will admit based on TIA/CVA protocol,        Monitor on Tele       MRI  Resulted - showing acute ischemic CVA but cannot rule out area of mets        Carotid Doppler ordered        Echo to evaluate for possible embolic source,        obtain cardiac enzymes,  ECG,   Lipid panel, TSH.        Order PT/OT evaluation.        keep nothing by mouth until passes swallow eval         Will make sure patient is on antiplatelet ASA  32  and statin        Allow permissive Hypertension keep BP <220/120        Neurology consult  in AM  . Essential hypertension, benign chronic allow some permissive hypertension for tonight  . Mixed hyperlipidemia stable check lipid panel in the morning and continue statins   . Hypothyroidism - - Check TSH continue home medications at current dose  . Pancreatic adenocarcinoma (HCC)-followed by oncology will notify patient has been admitted given MRI findings of possible metastasis  . Syncope -versus seizure in the setting of multiple CVAs versus metastasis.  Obtain EEG appreciate neurology consult Seizure precautions  Diabetes mellitus type 2  - Order Sensitive SSI   - continue home insulin regimen   -  check TSH and HgA1C   Tobacco abuse - - Spoke  about importance of quitting spent 5 minutes discussing options for treatment, prior attempts at quitting, and dangers of smoking  -At this point patient is   NOT  interested in quitting  - refused nicotine patch   - nursing tobacco cessation protocol    Other plan as per orders.  DVT prophylaxis:  SCD     Code Status:  FULL CODE  as per patient  I had personally discussed CODE STATUS with patient   Family Communication:   Family not at  Bedside   Disposition Plan:    To home once workup is complete and patient is stable                      Would benefit from PT/OT eval prior to DC  Ordered                                       Consults called: Please consult neurology in AM, notified oncology through Epic will see in AM  Admission status:  ED Disposition    ED Disposition Condition Comment   Admit  The patient appears reasonably stabilized for admission considering the current resources, flow, and capabilities available in the ED at this time, and I doubt any other [facility] requiring further screening and/or treatment in the ED prior to admission is  present.       Obs  Level of care     Tele indefinitely please discontinue once patient no longer qualifies   Precautions: admitted as Covid negative No active isolations     PPE: Used by the provider:   P100  eye Goggles,  Gloves     Etana Beets 10/08/2019, 12:47 AM   [facility]     after 2 AM please page floor coverage PA If 7AM-7PM, please contact the day team taking care of the patient using [email or web address]   Patient was evaluated in the context of the global COVID-19 pandemic, which necessitated consideration that the patient might be at risk for infection with the SARS-CoV-2 virus that causes COVID-19. Institutional protocols and algorithms that pertain to the evaluation of patients at risk for COVID-19 are in a state of rapid change based on information released by regulatory bodies including the  CDC and federal and state organizations. These policies and algorithms were followed during the patient's care.

## 2019-10-08 NOTE — ED Triage Notes (Signed)
Pt to ED via EMS from a restaurant. PT reports a syncopal episode lasting approx 2 minute. PT was lethargic and disoriented afterwards but is AO at this time. HX of pancreatic cancer, last chemo was 4 weeks ago. PT denies etoh.

## 2019-10-08 NOTE — ED Provider Notes (Signed)
[facility] Emergency Department Provider Note  Time seen: 8:09 PM  I have reviewed the triage vital signs and the nursing notes.   HISTORY  Chief Complaint Loss of Consciousness   HPI Mark Foster is a 64 y.o. male with a past medical history of neuropathy, CVA, pancreatic cancer, diabetes, presents to the emergency department for a loss of consciousness.   According to the patient he was at a restaurant, had just put his jacket down and then awoke on the floor several minutes later.  EMS states family stated the episode lasted approximately 2 minutes.  Afterwards the patient was lethargic and somewhat disoriented but upon arrival to the emergency department he is alert and oriented.  Patient has a history of pancreatic cancer, was taking chemotherapy for pancreatic cancer last of which was approximately 1 month ago.  Currently patient is awake alert oriented, denies any pain or discomfort denies any weakness or numbness denies any headache chest pain or abdominal pain.  Past Medical History:  Diagnosis Date  . Cervical compression fracture ([place])   . Cervical spinal stenosis   . Diabetic neuropathy (HCC)    Bilateral legs  . Diverticulitis   . Family history of pancreatic cancer   . History of kidney stones   . Hypothyroidism   . Pancreatic cancer ([place])   . Stenosis of left vertebral artery   . Stroke [facility])    2011  . Type 2 diabetes mellitus [facility])     Patient Active Problem List   Diagnosis Date Noted  . Injury of left ankle 03/24/2019  . Port-A-Cath in place 12/19/2018  . Genetic testing 11/15/2018  . Syncope 11/12/2018  . Generalized weakness 11/11/2018  . Family history of pancreatic cancer   . Hyperglycemia 10/09/2018  . Tobacco abuse counseling 10/09/2018  . Pancreatic adenocarcinoma ([place]) 09/03/2018  . H/O ETOH abuse 09/03/2018  . Hyperbilirubinemia 09/03/2018  . 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  Uncontrolled type 2 diabetes mellitus with hyperglycemia ([place]) 03/05/2018  . Hypothyroidism 03/05/2018  . Intracranial atherosclerosis 01/22/2018  . Spinal stenosis in cervical region   . Diabetes mellitus type 2 in nonobese (HCC)   . Left carotid artery occlusion 01/21/2018  . Cervical stenosis of spinal canal 01/21/2018  . Right hand weakness 01/21/2018  . Numbness of right hand 01/21/2018  . Essential hypertension, benign 01/21/2018  . Mixed hyperlipidemia 01/21/2018  . Diabetes ([place]) 01/21/2018  . Hx of completed stroke 01/21/2018  . Leukopenia 01/21/2018  . Thrombocytopenia ([place]) 01/21/2018  . L MCA Stroke-like episode ([place]) s/p IV tPA 01/19/2018    Past Surgical History:  Procedure Laterality Date  . APPENDECTOMY    . COLON RESECTION     For diverticulitis  . COLON SURGERY    . ESOPHAGOGASTRODUODENOSCOPY N/A 10/03/2018   Procedure: ESOPHAGOGASTRODUODENOSCOPY (EGD);  Surgeon: [name], MD;  Location: [name];  Service: Endoscopy;  Laterality: N/A;  . EUS N/A 10/03/2018   Procedure: UPPER ENDOSCOPIC ULTRASOUND (EUS) RADIAL;  Surgeon: [name], MD;  Location: [facility] END[facility];  Service: Endoscopy;  Laterality: N/A;  . FINE NEEDLE ASPIRATION N/A 10/03/2018   Procedure: FINE NEEDLE ASPIRATION (FNA) LINEAR;  Surgeon: [name], MD;  Location: [facility] ENDOSCOPY;  Service: Endoscopy;  Laterality: N/A;  . IR ANGIO INTRA EXTRACRAN SEL COM CAROTID INNOMINATE BILAT MOD SED  01/21/2018  . IR ANGIO INTRA EXTRACRAN SEL COM CAROTID INNOMINATE UNI R MOD SED  03/21/2018  . IR ANGIO VERTEBRAL SEL  VERTEBRAL BILAT MOD SED  01/21/2018  . IR TRANSCATH EXCRAN VERT OR CAR A STENT  03/21/2018  . PORTACATH PLACEMENT Left 12/23/2018   Procedure: INSERTION PORT-A-CATH;  Surgeon: Virl Cagey, MD;  Location: [facility];  Service: General;  Laterality: Left;  . RADIOLOGY WITH ANESTHESIA N/A 03/21/2018   Procedure: IR WITH ANESTHESIA WITH STENT PLACEMENT;  Surgeon: Luanne Bras, MD;   Location: [facility];  Service: Radiology;  Laterality: N/A;  . ROTATOR CUFF REPAIR     Left    Prior to Admission medications   Medication Sig Start Date End Date Taking? Authorizing Provider  aspirin 81 MG tablet Take 1 tablet (81 mg total) by mouth daily. 03/22/18   Louk, Alexandra M, PA-C  atorvastatin (LIPITOR) 80 MG tablet TAKE 1 TABLET(80 MG) BY MOUTH DAILY AT 6 PM 08/09/19   Gosrani, Nimish C, MD  ECHINACEA EXTRACT PO Take 2 tablets by mouth daily.     [provider]  gabapentin (NEURONTIN) 300 MG capsule Take 1 capsule (300 mg total) by mouth 2 (two) times daily. 01/13/19   Frann Rider, NP  Insulin Glargine (BASAGLAR KWIKPEN) 100 UNIT/ML SOPN INJECT 42 UNITS INTO THE SKIN AT BEDTIME 08/21/19   Cassandria Anger, MD  Insulin Pen Needle 32G X 4 MM MISC 1 Device by Does not apply route daily. 01/22/18   Donzetta Starch, NP  loperamide (IMODIUM A-D) 2 MG tablet Take 1 tablet (2 mg total) by mouth as needed. Take 2 at diarrhea onset , then 1 every 2hr until 12hrs with no BM. May take 2 every 4hrs at night. If diarrhea recurs repeat. 12/19/18   Derek Jack, MD  meclizine (ANTIVERT) 25 MG tablet Take 1 tablet (25 mg total) by mouth 3 (three) times daily as needed for dizziness. 04/23/19   Derek Jack, MD  NP THYROID 30 MG tablet Take 30 mg by mouth daily before breakfast.  01/17/18   [provider]  Omega-3 1000 MG CAPS Take 1,000 mg by mouth daily.    [provider]  ondansetron (ZOFRAN) 8 MG tablet Take 1 tablet (8 mg total) by mouth every 6 (six) hours as needed for nausea or vomiting. 04/11/19   Roger Shelter, FNP  pantoprazole (PROTONIX) 20 MG tablet Take 20 mg by mouth as needed.  11/25/18 11/25/19  [provider]  sertraline (ZOLOFT) 50 MG tablet TAKE 1 TABLET BY MOUTH DAILY 07/05/19   Ailene Ards, NP  tamsulosin (FLOMAX) 0.4 MG CAPS capsule Take 0.4 mg by mouth daily.  10/01/18   [provider]  traMADol (ULTRAM) 50 MG tablet  Take 1 tablet (50 mg total) by mouth every 6 (six) hours as needed. 09/27/19   Derek Jack, MD  vitamin B-12 (CYANOCOBALAMIN) 1000 MCG tablet Take 1,000 mcg by mouth daily.    [provider]  atorvastatin (LIPITOR) 80 MG tablet Take 1 tablet (80 mg total) by mouth daily at 6 PM. 05/15/19   Doree Albee, MD    Allergies  Allergen Reactions  . Demerol [Meperidine Hcl] Other (See Comments)    convulsions    Family History  Problem Relation Age of Onset  . Stroke Mother   . Pancreatic cancer Father 88       d. 68  . Stroke Maternal Grandmother   . Heart attack Maternal Grandfather   . Heart Problems Paternal Grandfather   . Scoliosis Daughter   . Muscular dystrophy Grandson     Social History Social History  Tobacco Use  . Smoking status: Current Every Day Smoker  . Smokeless tobacco: Never Used  Substance Use Topics  . Alcohol use: Not Currently  . Drug use: Never    Review of Systems Constitutional: Negative for fever. Cardiovascular: Negative for chest pain. Respiratory: Negative for shortness of breath. Gastrointestinal: Negative for abdominal pain Musculoskeletal: Negative for musculoskeletal complaints Neurological: Negative for headache All other ROS negative  ____________________________________________   PHYSICAL EXAM:  VITAL SIGNS: ED Triage Vitals  Enc Vitals Group     BP 10/08/19 2004 115/75     Pulse Rate 10/08/19 2000 94     Resp 10/08/19 2000 15     Temp 10/08/19 2004 97.8 F (36.6 C)     Temp src --      SpO2 10/08/19 2000 96 %     Weight 10/08/19 2001 150 lb (68 kg)     Height 10/08/19 2001 5\' 10"  (1.778 m)     Head Circumference --      Peak Flow --      Pain Score 10/08/19 2000 0     Pain Loc --      Pain Edu? --      Excl. in [place]? --    Constitutional: Alert and oriented. Well appearing and in no distress. Eyes: Normal exam ENT      Head: Normocephalic and atraumatic.      Mouth/Throat: Mucous membranes are  moist. Cardiovascular: Normal rate, regular rhythm.  Respiratory: Normal respiratory effort without tachypnea nor retractions. Breath sounds are clear  Gastrointestinal: Soft and nontender. No distention.  Musculoskeletal: Nontender with normal range of motion in all extremities. Neurologic:  Normal speech and language. No gross focal neurologic deficits Skin:  Skin is warm, dry and intact.  Psychiatric: Mood and affect are normal.  ____________________________________________    EKG  EKG viewed and interpreted by myself shows a normal sinus rhythm at 96 bpm with a narrow QRS, normal axis, normal intervals, no concerning ST changes.  Reassuring EKG.  ____________________________________________    RADIOLOGY  CT shows age-indeterminate small vessel infarcts.  MRI shows multifocal lesions likely representing acute to subacute infarcts.  Also a right temporal lobe subacute infarct possible small metastatic lesion to this area could have a similar appearance.  ____________________________________________   INITIAL IMPRESSION / ASSESSMENT AND PLAN / ED COURSE  Pertinent labs & imaging results that were available during my care of the patient were reviewed by me and considered in my medical decision making (see chart for details).   Patient presents to the emergency department after loss of consciousness while getting ready to eat dinner.  Per EMS report they report the patient was confused/disoriented upon awakening after the event.  This would definitely raise concern for syncope versus seizure.  Patient has a history of pancreatic cancer, differential would include metastatic disease.  Differential would also include ACS, arrhythmia, metabolic or electrolyte abnormality, dehydration, anemia.  We will check labs, IV hydrate obtain CT imaging of the head and continue to closely monitor.  Patient agreeable to plan of care.  MRI appears to show several acute to subacute infarcts and  possible metastatic lesion.  Given the patient's likely seizure tonight with acute CVAs on MR we will admit to the hospital service for further treatment and work-up and likely neurology and oncology consultation.  Mark Foster was evaluated in Emergency Department on 10/08/2019 for the symptoms described in the history of present illness. He was evaluated in the context of the global  COVID-19 pandemic, which necessitated consideration that the patient might be at risk for infection with the SARS-CoV-2 virus that causes COVID-19. Institutional protocols and algorithms that pertain to the evaluation of patients at risk for COVID-19 are in a state of rapid change based on information released by regulatory bodies including the CDC and federal and state organizations. These policies and algorithms were followed during the patient's care in the ED.  ____________________________________________   FINAL CLINICAL IMPRESSION(S) / ED DIAGNOSES  Loss of consciousness Acute CVA   Harvest Dark, MD 10/08/19 2330

## 2019-10-09 ENCOUNTER — Observation Stay: Admit: 2019-10-09 | Payer: Medicare Other

## 2019-10-09 ENCOUNTER — Observation Stay: Payer: Medicare Other

## 2019-10-09 ENCOUNTER — Observation Stay (HOSPITAL_COMMUNITY)
Admit: 2019-10-09 | Discharge: 2019-10-09 | Disposition: A | Discharge status: Home / Self Care | Payer: Medicare Other | Attending: Neurology | Admitting: Neurology

## 2019-10-09 ENCOUNTER — Telehealth: Payer: Self-pay | Admitting: Internal Medicine

## 2019-10-09 DIAGNOSIS — I6522 Occlusion and stenosis of left carotid artery: Secondary | ICD-10-CM | POA: Diagnosis present

## 2019-10-09 DIAGNOSIS — C259 Malignant neoplasm of pancreas, unspecified: Secondary | ICD-10-CM

## 2019-10-09 DIAGNOSIS — R4020 Unspecified coma: Secondary | ICD-10-CM

## 2019-10-09 DIAGNOSIS — R4182 Altered mental status, unspecified: Secondary | ICD-10-CM

## 2019-10-09 DIAGNOSIS — I639 Cerebral infarction, unspecified: Secondary | ICD-10-CM | POA: Diagnosis not present

## 2019-10-09 DIAGNOSIS — E1140 Type 2 diabetes mellitus with diabetic neuropathy, unspecified: Secondary | ICD-10-CM | POA: Diagnosis present

## 2019-10-09 DIAGNOSIS — R55 Syncope and collapse: Secondary | ICD-10-CM | POA: Diagnosis not present

## 2019-10-09 DIAGNOSIS — F17200 Nicotine dependence, unspecified, uncomplicated: Secondary | ICD-10-CM | POA: Diagnosis present

## 2019-10-09 DIAGNOSIS — Z8249 Family history of ischemic heart disease and other diseases of the circulatory system: Secondary | ICD-10-CM | POA: Diagnosis not present

## 2019-10-09 DIAGNOSIS — R930 Abnormal findings on diagnostic imaging of skull and head, not elsewhere classified: Secondary | ICD-10-CM | POA: Diagnosis not present

## 2019-10-09 DIAGNOSIS — Z823 Family history of stroke: Secondary | ICD-10-CM | POA: Diagnosis not present

## 2019-10-09 DIAGNOSIS — Z800 Family history of malignant neoplasm of digestive organs: Secondary | ICD-10-CM | POA: Diagnosis not present

## 2019-10-09 DIAGNOSIS — Z7982 Long term (current) use of aspirin: Secondary | ICD-10-CM | POA: Diagnosis not present

## 2019-10-09 DIAGNOSIS — R29700 NIHSS score 0: Secondary | ICD-10-CM | POA: Diagnosis present

## 2019-10-09 DIAGNOSIS — Z20822 Contact with and (suspected) exposure to covid-19: Secondary | ICD-10-CM | POA: Diagnosis present

## 2019-10-09 DIAGNOSIS — I6389 Other cerebral infarction: Secondary | ICD-10-CM

## 2019-10-09 DIAGNOSIS — C7931 Secondary malignant neoplasm of brain: Secondary | ICD-10-CM | POA: Diagnosis present

## 2019-10-09 DIAGNOSIS — E559 Vitamin D deficiency, unspecified: Secondary | ICD-10-CM | POA: Diagnosis present

## 2019-10-09 DIAGNOSIS — Z9221 Personal history of antineoplastic chemotherapy: Secondary | ICD-10-CM

## 2019-10-09 DIAGNOSIS — Z79891 Long term (current) use of opiate analgesic: Secondary | ICD-10-CM | POA: Diagnosis not present

## 2019-10-09 DIAGNOSIS — Z8673 Personal history of transient ischemic attack (TIA), and cerebral infarction without residual deficits: Secondary | ICD-10-CM | POA: Diagnosis not present

## 2019-10-09 DIAGNOSIS — Z9114 Patient's other noncompliance with medication regimen: Secondary | ICD-10-CM | POA: Diagnosis not present

## 2019-10-09 DIAGNOSIS — D6869 Other thrombophilia: Secondary | ICD-10-CM | POA: Diagnosis present

## 2019-10-09 DIAGNOSIS — Z87442 Personal history of urinary calculi: Secondary | ICD-10-CM | POA: Diagnosis not present

## 2019-10-09 DIAGNOSIS — Z885 Allergy status to narcotic agent status: Secondary | ICD-10-CM | POA: Diagnosis not present

## 2019-10-09 DIAGNOSIS — Z79899 Other long term (current) drug therapy: Secondary | ICD-10-CM | POA: Diagnosis not present

## 2019-10-09 DIAGNOSIS — E782 Mixed hyperlipidemia: Secondary | ICD-10-CM | POA: Diagnosis present

## 2019-10-09 DIAGNOSIS — I63433 Cerebral infarction due to embolism of bilateral posterior cerebral arteries: Secondary | ICD-10-CM | POA: Diagnosis present

## 2019-10-09 DIAGNOSIS — E039 Hypothyroidism, unspecified: Secondary | ICD-10-CM | POA: Diagnosis present

## 2019-10-09 DIAGNOSIS — Z794 Long term (current) use of insulin: Secondary | ICD-10-CM | POA: Diagnosis not present

## 2019-10-09 HISTORY — DX: Cerebral infarction, unspecified: I63.9

## 2019-10-09 LAB — URINALYSIS, COMPLETE (UACMP) WITH MICROSCOPIC
Bacteria, UA: NONE SEEN
Bilirubin Urine: NEGATIVE
Glucose, UA: >=500 mg/dL — AB
Hgb urine dipstick: NEGATIVE
Ketones, ur: NEGATIVE mg/dL
Leukocytes,Ua: NEGATIVE
Nitrite: NEGATIVE
Protein, ur: NEGATIVE mg/dL
Specific Gravity, Urine: 1.03 (ref 1.005–1.030)
Squamous Epithelial / HPF: NONE SEEN (ref 0–5)
pH: 5 (ref 5.0–8.0)

## 2019-10-09 LAB — RESPIRATORY PANEL BY RT PCR (FLU A&B, COVID)
Influenza A by PCR: NEGATIVE
Influenza B by PCR: NEGATIVE
SARS Coronavirus 2 by RT PCR: NEGATIVE

## 2019-10-09 LAB — GLUCOSE, CAPILLARY
Glucose-Capillary: 313 mg/dL — ABNORMAL HIGH (ref 70–99)
Glucose-Capillary: 173 mg/dL — ABNORMAL HIGH (ref 70–99)
Glucose-Capillary: 376 mg/dL — ABNORMAL HIGH (ref 70–99)
Glucose-Capillary: 297 mg/dL — ABNORMAL HIGH (ref 70–99)
Glucose-Capillary: 274 mg/dL — ABNORMAL HIGH (ref 70–99)
Glucose-Capillary: 232 mg/dL — ABNORMAL HIGH (ref 70–99)

## 2019-10-09 LAB — LIPID PANEL
Cholesterol: 186 mg/dL (ref 0–200)
HDL: 42 mg/dL (ref >40)
LDL Cholesterol: 123 mg/dL — ABNORMAL HIGH (ref 0–99)
Total CHOL/HDL Ratio: 4.4 RATIO
Triglycerides: 105 mg/dL (ref <150)
VLDL: 21 mg/dL (ref 0–40)

## 2019-10-09 LAB — HEMOGLOBIN A1C
Hgb A1c MFr Bld: 12.1 % — ABNORMAL HIGH (ref 4.8–5.6)
Mean Plasma Glucose: 300.57 mg/dL

## 2019-10-09 LAB — HIV ANTIBODY (ROUTINE TESTING W REFLEX): HIV Screen 4th Generation wRfx: NONREACTIVE

## 2019-10-09 LAB — TROPONIN I (HIGH SENSITIVITY): Troponin I (High Sensitivity): 3 ng/L (ref <18)

## 2019-10-09 MED ORDER — ASPIRIN EC 325 MG PO TBEC
325.0000 mg | DELAYED_RELEASE_TABLET | Freq: Every day | ORAL | Status: DC
  Administered 2019-10-09 – 2019-10-10 (×2): 325 mg via ORAL
  Filled 2019-10-09 (×2): qty 1

## 2019-10-09 MED ORDER — TAMSULOSIN HCL 0.4 MG PO CAPS
0.4000 mg | ORAL_CAPSULE | Freq: Every day | ORAL | Status: DC
  Administered 2019-10-09 – 2019-10-10 (×2): 0.4 mg via ORAL
  Filled 2019-10-09 (×2): qty 1

## 2019-10-09 MED ORDER — THYROID 30 MG PO TABS
30.0000 mg | ORAL_TABLET | Freq: Every day | ORAL | Status: DC
  Administered 2019-10-09 – 2019-10-10 (×2): 30 mg via ORAL
  Filled 2019-10-09 (×2): qty 1

## 2019-10-09 MED ORDER — ACETAMINOPHEN 160 MG/5ML PO SOLN
650.0000 mg | ORAL | Status: DC | PRN
  Filled 2019-10-09: qty 20.3

## 2019-10-09 MED ORDER — ACETAMINOPHEN 650 MG RE SUPP: 650.0000 mg | RECTAL | Status: DC | PRN

## 2019-10-09 MED ORDER — ATORVASTATIN CALCIUM 20 MG PO TABS
80.0000 mg | ORAL_TABLET | Freq: Every day | ORAL | Status: DC
  Administered 2019-10-09: 18:00:00 80 mg via ORAL
  Filled 2019-10-09: qty 4

## 2019-10-09 MED ORDER — INSULIN GLARGINE 100 UNIT/ML ~~LOC~~ SOLN
32.0000 [IU] | Freq: Every day | SUBCUTANEOUS | Status: DC
  Administered 2019-10-09: 32 [IU] via SUBCUTANEOUS
  Filled 2019-10-09 (×3): qty 0.32

## 2019-10-09 MED ORDER — PANTOPRAZOLE SODIUM 20 MG PO TBEC
20.0000 mg | DELAYED_RELEASE_TABLET | Freq: Every day | ORAL | Status: DC
  Administered 2019-10-09 – 2019-10-10 (×2): 20 mg via ORAL
  Filled 2019-10-09 (×2): qty 1

## 2019-10-09 MED ORDER — SERTRALINE HCL 50 MG PO TABS
50.0000 mg | ORAL_TABLET | Freq: Every day | ORAL | Status: DC
  Administered 2019-10-09 – 2019-10-10 (×2): 50 mg via ORAL
  Filled 2019-10-09 (×2): qty 1

## 2019-10-09 MED ORDER — ACETAMINOPHEN 325 MG PO TABS: 650.0000 mg | ORAL_TABLET | ORAL | Status: DC | PRN

## 2019-10-09 MED ORDER — INSULIN ASPART 100 UNIT/ML ~~LOC~~ SOLN
0.0000 [IU] | SUBCUTANEOUS | Status: DC
  Administered 2019-10-09: 2 [IU] via SUBCUTANEOUS
  Administered 2019-10-09: 04:00:00 7 [IU] via SUBCUTANEOUS
  Administered 2019-10-09: 3 [IU] via SUBCUTANEOUS
  Administered 2019-10-09: 18:00:00 5 [IU] via SUBCUTANEOUS
  Administered 2019-10-09: 13:00:00 9 [IU] via SUBCUTANEOUS
  Administered 2019-10-09: 21:00:00 5 [IU] via SUBCUTANEOUS
  Administered 2019-10-10: 09:00:00 1 [IU] via SUBCUTANEOUS
  Administered 2019-10-10 (×2): 2 [IU] via SUBCUTANEOUS
  Filled 2019-10-09 (×9): qty 1

## 2019-10-09 MED ORDER — LORAZEPAM 2 MG/ML IJ SOLN: 1.0000 mg | INTRAMUSCULAR | Status: DC | PRN

## 2019-10-09 MED ORDER — ASPIRIN 300 MG RE SUPP
300.0000 mg | Freq: Every day | RECTAL | Status: DC
  Filled 2019-10-09: qty 1

## 2019-10-09 MED ORDER — TRAMADOL HCL 50 MG PO TABS: 50.0000 mg | ORAL_TABLET | Freq: Four times a day (QID) | ORAL | Status: DC | PRN

## 2019-10-09 MED ORDER — SODIUM CHLORIDE 0.9 % IV SOLN: INTRAVENOUS | Status: DC

## 2019-10-09 MED ORDER — STROKE: EARLY STAGES OF RECOVERY BOOK: Freq: Once | Status: DC

## 2019-10-09 MED ORDER — ENOXAPARIN SODIUM 40 MG/0.4ML ~~LOC~~ SOLN
40.0000 mg | SUBCUTANEOUS | Status: DC
  Administered 2019-10-09: 18:00:00 40 mg via SUBCUTANEOUS
  Filled 2019-10-09 (×2): qty 0.4

## 2019-10-09 NOTE — Progress Notes (Signed)
eeg completed ° °

## 2019-10-09 NOTE — Assessment & Plan Note (Addendum)
64 year old male patient with a history of pancreatic cancer is currently admitted to hospital for mental status changes-MRI without contrast suggestive of stroke; additional lesion concerning for metastatic lesion.   # Pancreatic adenocarcinoma-stage II status post Whipple's-s/p adjuvant chemotherapy.  Most recent imaging January 2021 negative from recurrence.  Clinically it is less likely for pancreatic cancer to cause brain metastases.  However I think it is reasonable to proceed with MRI with and without contrast for further evaluation of the concerning lesion the right temporal lobe.  Check CA 19-9.  #Acute stroke-bilateral-reviewed/and appreciate neurology evaluation recommendations.  Given the likely absence of active metastatic disease it is less likely pancreatic cancer is causing hypercoagulable state.  Defer to neurology for further recommendations.  Thank you Dr.Joseph for allowing me to participate in the care of your pleasant patient. Please do not hesitate to contact me with questions or concerns in the interim.  The above plan of care was discussed with the patient in detail.  He is in agreement.  Also discussed with patient's primary oncologist Dr. Delton Coombes.

## 2019-10-09 NOTE — Progress Notes (Signed)
Inpatient Diabetes Program Recommendations  AACE/ADA: New Consensus Statement on Inpatient Glycemic Control (2015)  Target Ranges:  Prepandial:   less than 140 mg/dL      Peak postprandial:   less than 180 mg/dL (1-2 hours)      Critically ill patients:  140 - 180 mg/dL   Lab Results  Component Value Date   GLUCAP 376 (H) 10/09/2019   HGBA1C 12.1 (H) 10/08/2019    Review of Glycemic Control Results for Mark Foster, Mark Foster (MRN [id number]) as of 10/09/2019 12:21  Ref. Range 10/09/2019 03:32 10/09/2019 08:42 10/09/2019 12:04  Glucose-Capillary Latest Ref Range: 70 - 99 mg/dL 313 (H) 173 (H) 376 (H)   Diabetes history: DM 2 Outpatient Diabetes medications: Basaglar 42 units q HS Current orders for Inpatient glycemic control:  Lantus 32 units q HS, Novolog sensitive q 4 hours  Inpatient Diabetes Program Recommendations:    Consider changing Novolog correction to tid with meals and HS.  Add Novolog meal coverage 3 units tid with meals.   Thanks  Adah Perl, RN, BC-ADM Inpatient Diabetes Coordinator Pager [phone] (8a-5p)

## 2019-10-09 NOTE — ED Notes (Addendum)
Sent MD a message notifying of pt's stroke swallow screen pass

## 2019-10-09 NOTE — ED Notes (Signed)
Pt transferred from ED stretcher to hospital bed and SCD applied at this time

## 2019-10-09 NOTE — Evaluation (Signed)
Occupational Therapy Evaluation Patient Details Name: Mark Foster MRN: [id number] DOB: 08-30-1956 Today's Date: 10/09/2019    History of Present Illness 64 y.o. male with medical history significant of DM2.  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  Here after syncopal episode with apart LOC, MRI of brain reveals likely small infarct.   Clinical Impression   Pt was seen for OT evaluation this date. Prior to hospital admission, pt was Indep with all ADLs/ADL mobility and IADLs including driving and riding a motorcycle. Currently pt demonstrates impairments as described below (See OT problem list) which functionally limit his ability to perform ADL/self-care tasks. Pt currently with very slightly decreased coordination which he endorses in stating he feels "slightly off".  However, pt currently performing all ADLs/ADL mobility with MOD I and does not appear to have any acute OT needs. Do not anticipate any further OT needs upon d/c. Will sign off at this time. Thank you.      Follow Up Recommendations  No OT follow up    Equipment Recommendations  None recommended by OT    Recommendations for Other Services       Precautions / Restrictions Precautions Precautions: Fall(low) Restrictions Weight Bearing Restrictions: No      Mobility Bed Mobility Overal bed mobility: Independent                Transfers                 General transfer comment: t/f not assessed on OT eval, per PT note, pt MOD I    Balance Overall balance assessment: Modified Independent                                         ADL either performed or assessed with clinical judgement   ADL                                         General ADL Comments: pt performs all UB/LB ADLs assessed in sitting with setup to MOD I. Standing not assessed on OT eval. MOD I for t/f  per PT note, MOD I for fxl mobility and standing balance.     Vision Baseline Vision/History: Wears glasses Patient Visual Report: No change from baseline Vision Assessment?: Yes Eye Alignment: Within Functional Limits Ocular Range of Motion: Within Functional Limits Alignment/Gaze Preference: Within Defined Limits Tracking/Visual Pursuits: Able to track stimulus in all quads without difficulty Saccades: Within functional limits Convergence: Within functional limits     Perception     Praxis      Pertinent Vitals/Pain Pain Assessment: No/denies pain(reports slight R calf pain when standing, but no pain on OT assessment)     Hand Dominance Right   Extremity/Trunk Assessment Upper Extremity Assessment Upper Extremity Assessment: Overall WFL for tasks assessed(potentially slight dysdiadochokinesia on rapid alternating movements. Appears L UE could be slightly off pace, some slower coordination with opposition on L hand. But Overall WFL ROM and MMT)   Lower Extremity Assessment Lower Extremity Assessment: Defer to PT evaluation;Overall WFL for tasks assessed       Communication Communication Communication: No difficulties   Cognition Arousal/Alertness: Awake/alert Behavior During Therapy: WFL for tasks assessed/performed Overall Cognitive Status: Within Functional Limits  for tasks assessed                                     General Comments       Exercises Other Exercises Other Exercises: OT facilitates education re: role of OT in acute setting. Pt verbalized understanding.   Shoulder Instructions      Home Living Family/patient expects to be discharged to:: Private residence Living Arrangements: Alone Available Help at Discharge: Other (Comment)(minimal help available, but states could find someone to help him if necessary.) Type of Home: House Home Access: Stairs to enter [employer] of Steps: 3   Home Layout: One level      Bathroom Shower/Tub: Astronomer Accessibility: Yes   Home Equipment: Cane - quad;Cane - single point;Walker - 2 wheels          Prior Functioning/Environment Level of Independence: Independent with assistive device(s)        Comments: community ambulator with quad cane, drives, still rides motorcyle, plays guitar        OT Problem List: Decreased activity tolerance;Impaired balance (sitting and/or standing)      OT Treatment/Interventions:      OT Goals(Current goals can be found in the care plan section) Acute Rehab OT Goals Patient Stated Goal: Go home OT Goal Formulation: All assessment and education complete, DC therapy  OT Frequency:     Barriers to D/C:            Co-evaluation              AM-PAC OT "6 Clicks" Daily Activity     Outcome Measure Help from another person eating meals?: None Help from another person taking care of personal grooming?: None Help from another person toileting, which includes using toliet, bedpan, or urinal?: None Help from another person bathing (including washing, rinsing, drying)?: None Help from another person to put on and taking off regular upper body clothing?: None Help from another person to put on and taking off regular lower body clothing?: None 6 Click Score: 24   End of Session    Activity Tolerance: Patient tolerated treatment well Patient left: in bed;with call bell/phone within reach;with nursing/sitter in room(nursing student presents with lunch tray)  OT Visit Diagnosis: Unsteadiness on feet (R26.81)                Time: [id number] OT Time Calculation (min): 21 min Charges:  OT General Charges $OT Visit: 1 Visit OT Evaluation $OT Eval Low Complexity: 1 Low OT Treatments $Self Care/Home Management : 8-22 mins  Gerrianne Scale, MS, OTR/L ascom [phone] 10/09/19, 4:45 PM

## 2019-10-09 NOTE — Telephone Encounter (Signed)
Pt patient unavailable; at ultrasound.  Patient will need MRI of the brain with and without contrast to further elevation for metastatic lesion in the brain.  We will follow-up later.

## 2019-10-09 NOTE — Consult Note (Signed)
[facility] CONSULT NOTE  Patient Care Team: Doree Albee, MD as PCP - General (Internal Medicine) Derek Jack, MD as Consulting Physician (Hematology and Oncology)  CHIEF COMPLAINTS/PURPOSE OF CONSULTATION: Pancreatic cancer/stroke  HISTORY OF PRESENTING ILLNESS:  Mark Foster 64 y.o.  male with a prior history of stroke/TIA; diabetes and history of pancreatic adenocarcinoma stage II-s/p [place] and adjuvant chemotherapy is currently admitted to hospital for mental status changes; noncontrast MRI brain showed acute bilateral stroke/and also concerning lesion for possible metastatic. Oncology has been consulted for further recommendations.  With regards to history of pancreatic cancer-status post distal pancreatectomy/splenectomy in March 2020.  Patient also status post adjuvant chemotherapy with FOLFIRINOX finishing September 2020.  Most recent imaging January 2021-abdomen pelvis CT scan negative for any acute process.  Patient most recent CA 19-9 within normal limits.  Patient states that he has been in his regular health until the evening prior to presentation noted to have acute onset of mental status changes.  Patient states to be at restaurant-noted to have slurring of speech mental status changes.  On further evaluation emergency room with MRI-noncontrast showed acute to subacute infarcts multifocal bilateral frontal and right posterior parietal lobe.  However another tiny focus noted in the right temporal lobe concerning for possible metastatic deposit.  Patient is currently back at his baseline mental status.  Denies any slurring of speech.  Patient denies any focal neurological deficits.  Review of Systems  Constitutional: Negative for chills, diaphoresis, fever, malaise/fatigue and weight loss.  HENT: Negative for nosebleeds and sore throat.   Eyes: Negative for double vision.  Respiratory: Negative for cough, hemoptysis, sputum production, shortness of  breath and wheezing.   Cardiovascular: Negative for chest pain, palpitations, orthopnea and leg swelling.  Gastrointestinal: Negative for abdominal pain, blood in stool, constipation, diarrhea, heartburn, melena, nausea and vomiting.  Genitourinary: Negative for dysuria, frequency and urgency.  Musculoskeletal: Negative for back pain and joint pain.  Skin: Negative.  Negative for itching and rash.  Neurological: Positive for dizziness, speech change and loss of consciousness. Negative for tingling, focal weakness, weakness and headaches.  Endo/Heme/Allergies: Does not bruise/bleed easily.  Psychiatric/Behavioral: Negative for depression. The patient is not nervous/anxious and does not have insomnia.      MEDICAL HISTORY:  Past Medical History:  Diagnosis Date  . Cervical compression fracture ([place])   . Cervical spinal stenosis   . Diabetic neuropathy ([facility])    Bilateral legs  . Diverticulitis   . Family history of pancreatic cancer   . History of kidney stones   . Hypothyroidism   . Pancreatic cancer ([place])   . Stenosis of left vertebral artery   . Stroke [facility])    2011  . Type 2 diabetes mellitus ([place])     SURGICAL HISTORY: Past Surgical History:  Procedure Laterality Date  . APPENDECTOMY    . COLON RESECTION     For diverticulitis  . COLON SURGERY    . ESOPHAGOGASTRODUODENOSCOPY N/A 10/03/2018   Procedure: ESOPHAGOGASTRODUODENOSCOPY (EGD);  Surgeon: [name], MD;  Location: [name];  Service: Endoscopy;  Laterality: N/A;  . EUS N/A 10/03/2018   Procedure: UPPER ENDOSCOPIC ULTRASOUND (EUS) RADIAL;  Surgeon: [name], MD;  Location: [facility];  Service: Endoscopy;  Laterality: N/A;  . FINE NEEDLE ASPIRATION N/A 10/03/2018   Procedure: FINE NEEDLE ASPIRATION (FNA) LINEAR;  Surgeon: [name], MD;  Location: [facility];  Service: Endoscopy;  Laterality: N/A;  . IR ANGIO INTRA EXTRACRAN SEL COM  CAROTID INNOMINATE BILAT MOD SED  01/21/2018  . IR ANGIO  INTRA EXTRACRAN SEL COM CAROTID INNOMINATE UNI R MOD SED  03/21/2018  . IR ANGIO VERTEBRAL SEL VERTEBRAL BILAT MOD SED  01/21/2018  . IR TRANSCATH EXCRAN VERT OR CAR A STENT  03/21/2018  . PORTACATH PLACEMENT Left 12/23/2018   Procedure: INSERTION PORT-A-CATH;  Surgeon: Virl Cagey, MD;  Location: [facility];  Service: General;  Laterality: Left;  . RADIOLOGY WITH ANESTHESIA N/A 03/21/2018   Procedure: IR WITH ANESTHESIA WITH STENT PLACEMENT;  Surgeon: Luanne Bras, MD;  Location: [facility];  Service: Radiology;  Laterality: N/A;  . ROTATOR CUFF REPAIR     Left    SOCIAL HISTORY: Social History   Socioeconomic History  . Marital status: Single    Spouse name: Not on file  . Number of children: 2  . Years of education: Not on file  . Highest education level: Not on file  Occupational History  . Occupation: Estate agent  Tobacco Use  . Smoking status: Current Every Day Smoker  . Smokeless tobacco: Never Used  Substance and Sexual Activity  . Alcohol use: Not Currently  . Drug use: Never  . Sexual activity: Not on file  Other Topics Concern  . Not on file  Social History Narrative  . Not on file   Social Determinants of Health   Financial Resource Strain: Medium Risk  . Difficulty of Paying Living Expenses: Somewhat hard  Food Insecurity: No Food Insecurity  . Worried About Charity fundraiser in the Last Year: Never true  . Ran Out of Food in the Last Year: Never true  Transportation Needs: No Transportation Needs  . Lack of Transportation (Medical): No  . Lack of Transportation (Non-Medical): No  Physical Activity: Inactive  . Days of Exercise per Week: 0 days  . Minutes of Exercise per Session: 0 min  Stress: Stress Concern Present  . Feeling of Stress : To some extent  Social Connections: Somewhat Isolated  . Frequency of Communication with Friends and Family: More than three times a week  . Frequency of Social Gatherings with Friends and Family: Once a week   . Attends Religious Services: More than 4 times per year  . Active Member of Clubs or Organizations: No  . Attends Archivist Meetings: Never  . Marital Status: Divorced  Human resources officer Violence: Not At Risk  . Fear of Current or Ex-Partner: No  . Emotionally Abused: No  . Physically Abused: No  . Sexually Abused: No    FAMILY HISTORY: Family History  Problem Relation Age of Onset  . Stroke Mother   . Pancreatic cancer Father 27       d. 80  . Stroke Maternal Grandmother   . Heart attack Maternal Grandfather   . Heart Problems Paternal Grandfather   . Scoliosis Daughter   . Muscular dystrophy Grandson     ALLERGIES:  is allergic to demerol [meperidine hcl].  MEDICATIONS:  Current Facility-Administered Medications  Medication Dose Route Frequency Provider Last Rate Last Admin  .  stroke: mapping our early stages of recovery book   Does not apply Once Doutova, Nyoka Lint, MD      . 0.9 %  sodium chloride infusion   Intravenous Continuous Toy Baker, MD   Stopped at 10/09/19 1528  . acetaminophen (TYLENOL) tablet 650 mg  650 mg Oral Q4H PRN Doutova, Anastassia, MD       Or  . acetaminophen (TYLENOL) 160 MG/5ML solution  650 mg  650 mg Per Tube Q4H PRN Toy Baker, MD       Or  . acetaminophen (TYLENOL) suppository 650 mg  650 mg Rectal Q4H PRN Doutova, Anastassia, MD      . aspirin suppository 300 mg  300 mg Rectal Daily Doutova, Anastassia, MD       Or  . aspirin EC tablet 325 mg  325 mg Oral Daily Doutova, Anastassia, MD   325 mg at 10/09/19 0958  . atorvastatin (LIPITOR) tablet 80 mg  80 mg Oral q1800 Doutova, Anastassia, MD   80 mg at 10/09/19 1750  . enoxaparin (LOVENOX) injection 40 mg  40 mg Subcutaneous Q24H Domenic Polite, MD   40 mg at 10/09/19 1750  . insulin aspart (novoLOG) injection 0-9 Units  0-9 Units Subcutaneous Q4H Toy Baker, MD   5 Units at 10/09/19 1751  . insulin glargine (LANTUS) injection 32 Units  32 Units  Subcutaneous Q2200 Doutova, Anastassia, MD      . LORazepam (ATIVAN) injection 1 mg  1 mg Intravenous Q4H PRN Domenic Polite, MD      . pantoprazole (PROTONIX) EC tablet 20 mg  20 mg Oral Daily Toy Baker, MD   20 mg at 10/09/19 0958  . sertraline (ZOLOFT) tablet 50 mg  50 mg Oral Daily Doutova, Anastassia, MD   50 mg at 10/09/19 0958  . tamsulosin (FLOMAX) capsule 0.4 mg  0.4 mg Oral Daily Doutova, Anastassia, MD   0.4 mg at 10/09/19 0958  . thyroid (ARMOUR) tablet 30 mg  30 mg Oral QAC breakfast Doutova, Anastassia, MD   30 mg at 10/09/19 0850  . traMADol (ULTRAM) tablet 50 mg  50 mg Oral Q6H PRN Toy Baker, MD       Facility-Administered Medications Ordered in Other Encounters  Medication Dose Route Frequency Provider Last Rate Last Admin  . 0.9 %  sodium chloride infusion   Intravenous Continuous Derek Jack, MD 20 mL/hr at 12/30/18 1351 New Bag at 12/30/18 1351  . 0.9 %  sodium chloride infusion   Intravenous Continuous Derek Jack, MD 20 mL/hr at 12/30/18 1401 New Bag at 12/30/18 1401  . dextrose 5 % solution   Intravenous Once Derek Jack, MD      . heparin lock flush 100 unit/mL  500 Units Intracatheter Once PRN Derek Jack, MD      . sodium chloride flush (NS) 0.9 % injection 10 mL  10 mL Intracatheter PRN Derek Jack, MD   10 mL at 12/30/18 0911  . sodium chloride flush (NS) 0.9 % injection 10 mL  10 mL Intracatheter PRN Derek Jack, MD   10 mL at 05/21/19 0845      .  PHYSICAL EXAMINATION:  Vitals:   10/09/19 1658 10/09/19 1858  BP: 104/61 108/64  Pulse: 79 85  Resp: 18 18  Temp: 97.7 F (36.5 C) 97.7 F (36.5 C)  SpO2: 97% 99%   Filed Weights   10/08/19 2001  Weight: 150 lb (68 kg)    Physical Exam  Constitutional: He is oriented to person, place, and time and well-developed, well-nourished, and in no distress.  HENT:  Head: Normocephalic and atraumatic.  Mouth/Throat: Oropharynx is clear  and moist. No oropharyngeal exudate.  Eyes: Pupils are equal, round, and reactive to light.  Cardiovascular: Normal rate and regular rhythm.  Pulmonary/Chest: Effort normal and breath sounds normal. No respiratory distress. He has no wheezes.  Abdominal: Soft. Bowel sounds are normal. He exhibits no distension and no mass.  There is no abdominal tenderness. There is no rebound and no guarding.  Musculoskeletal:        General: No tenderness or edema. Normal range of motion.     Cervical back: Normal range of motion and neck supple.  Neurological: He is alert and oriented to person, place, and time.  Skin: Skin is warm.  Psychiatric: Affect normal.     LABORATORY DATA:  I have reviewed the data as listed Lab Results  Component Value Date   WBC 9.2 10/08/2019   HGB 14.9 10/08/2019   HCT 42.9 10/08/2019   MCV 91.1 10/08/2019   PLT 215 10/08/2019   Recent Labs    06/18/19 1523 06/18/19 1523 09/19/19 1017 09/30/19 1113 10/08/19 2009  NA 135   < > 134* 136 137  K 4.6   < > 3.7 4.9 3.5  CL 99   < > 99 99 102  CO2 27   < > 25 20 26   GLUCOSE 286*   < > 162* 276* 251*  BUN 10   < > 25* 14 21  CREATININE 0.68   < > 0.55* 0.81 0.70  CALCIUM 9.5   < > 9.4 10.0 8.7*  GFRNONAA >60   < > >60 94 >60  GFRAA >60   < > >60 109 >60  PROT 7.2   < > 7.6 7.3 6.4*  ALBUMIN 4.2  --  4.2  --  3.7  AST 20   < > 18 12 18   ALT 21   < > 18 11 14   ALKPHOS 198*  --  68  --  56  BILITOT 0.3   < > 1.4* 0.7 0.8   < > = values in this interval not displayed.    RADIOGRAPHIC STUDIES: I have personally reviewed the radiological images as listed and agreed with the findings in the report. EEG  Result Date: 10/09/2019 Alexis Goodell, MD     10/09/2019  4:14 PM ELECTROENCEPHALOGRAM REPORT Patient: Raydel Latsko       Room #: [id number] EEG No. ID: [id number] Age: 63 y.o.        Sex: male Requesting Physician: Broadus John Report Date:  10/09/2019       Interpreting Physician: Alexis Goodell History: Esaul Renfroe is an 64  y.o. male with syncope Medications: ASA, Thyroid Armour, Lipitor, Zoloft, Flomax, Insulin Conditions of Recording:  This is a 21 channel routine scalp EEG performed with bipolar and monopolar montages arranged in accordance to the international 10/20 system of electrode placement. One channel was dedicated to EKG recording. The patient is in the awake, drowsy and asleep states. Description:  The waking background activity consists of a low voltage, symmetrical, fairly well organized, 9-10 Hz alpha activity, seen from the parieto-occipital and posterior temporal regions.  Low voltage fast activity, poorly organized, is seen anteriorly and is at times superimposed on more posterior regions.  A mixture of theta and alpha rhythms are seen from the central and temporal regions. The patient drowses with slowing to irregular, low voltage theta and beta activity.  The patient goes in to a light sleep with symmetrical sleep spindles, vertex central sharp transients and irregular slow activity. No epileptiform activity is noted.  Hyperventilation was not performed.  Intermittent photic stimulation was performed but failed to illicit any change in the tracing.  IMPRESSION: Normal electroencephalogram, awake, asleep and with activation procedures. There are no focal lateralizing or epileptiform features. Alexis Goodell, MD Neurology [phone] 10/09/2019, 4:11 PM   DG Chest  1 View  Result Date: 10/09/2019 CLINICAL DATA:  Stroke-like symptoms EXAM: CHEST  1 VIEW COMPARISON:  12/23/2018 FINDINGS: Cardiac shadow is within normal limits. Lungs are well aerated without focal infiltrate or sizable. Left chest wall port is superior cava. No bony is seen. IMPRESSION: No acute abnormality noted. Electronically Signed   By: Inez Catalina M.D.   On: 10/09/2019 00:19   CT Head Wo Contrast  Result Date: 10/08/2019 CLINICAL DATA:  Syncope EXAM: CT HEAD WITHOUT CONTRAST TECHNIQUE: Contiguous axial images were obtained from the base of  the skull through the vertex without intravenous contrast. COMPARISON:  04/03/2019 FINDINGS: Brain: There is no acute intracranial hemorrhage, mass-effect, or edema. There is no new loss of gray differentiation. Left parietal chronic infarct again noted. New age-indeterminate small vessel infarcts of the left caudate and left cerebellum. Additional patchy hypoattenuation in the supratentorial white matter is nonspecific but may reflect minor chronic microvascular ischemic changes. There is no extra-axial fluid collection. Ventricles and sulci are within normal limits in size and configuration. Vascular: There is atherosclerotic calcification at the skull base. Skull: Calvarium is unremarkable. Sinuses/Orbits: Mild ethmoid sinus mucosal thickening. Orbits are unremarkable. Other: Mastoid air cells are clear. IMPRESSION: No acute intracranial hemorrhage or mass effect. New age-indeterminate small vessel infarcts of the left caudate and left cerebellum. Chronic left parietal infarct. Electronically Signed   By: Macy Mis M.D.   On: 10/08/2019 20:42   MR BRAIN WO CONTRAST  Result Date: 10/08/2019 CLINICAL DATA:  Stroke follow-up. Syncope. EXAM: MRI HEAD WITHOUT CONTRAST TECHNIQUE: Multiplanar, multiecho pulse sequences of the brain and surrounding structures were obtained without intravenous contrast. COMPARISON:  Brain MRI 05/19/2019 FINDINGS: Brain: There are punctate foci of diffusion restriction within the bilateral frontal white matter. There is an area of milder diffusion abnormality within the posterior right parietal lobe. Area of encephalomalacia along the left postcentral gyrus is unchanged. There is a new focus of hyperintense T2-weighted signal within the right temporal lobe (series 15 image 21). Small focus of hemosiderin deposition in the left cerebellum. Vascular: Unchanged abnormal left internal carotid artery flow void. Skull and upper cervical spine:  Normal bone marrow signal. Sinuses/Orbits:  Small amount of left mastoid fluid. Normal orbits. Other: IMPRESSION: 1. Multifocal abnormal diffusion restriction within the bilateral frontal white matter and within the posterior right parietal lobe, likely indicating acute to subacute infarcts. 2. New focus of hyperintense T2-weighted signal within the right temporal lobe, which may be a late subacute infarct. However, a small metastatic lesion could also have this appearance. Further imaging with intravenous contrast might be helpful for further evaluation. 3. Unchanged abnormal left internal carotid artery flow void, consistent with occlusion. 4. Unchanged encephalomalacia along the left postcentral gyrus. Electronically Signed   By: Ulyses Jarred M.D.   On: 10/08/2019 23:16   CT Abdomen Pelvis W Contrast  Result Date: 09/19/2019 CLINICAL DATA:  Restaging pancreatic adenocarcinoma. EXAM: CT ABDOMEN AND PELVIS WITH CONTRAST TECHNIQUE: Multidetector CT imaging of the abdomen and pelvis was performed using the standard protocol following bolus administration of intravenous contrast. CONTRAST:  144mL OMNIPAQUE IOHEXOL 300 MG/ML  SOLN COMPARISON:  06/18/2019 FINDINGS: Lower chest: No acute abnormality. Hepatobiliary: No suspicious liver abnormality. 6 mm gallstone unchanged. No gallbladder inflammation or bile duct dilatation. Pancreas: The previously characterized fluid collection along the resection margin of the pancreas measures 1.1 x 1.1 cm, image 31/7. Stable the small low-density structure within the uncinate process is also unchanged measuring 8 mm, image 43/7. No new abnormality.  Spleen: Status post splenectomy. Adrenals/Urinary Tract: Normal appearance of the adrenal glands. Kidneys are unremarkable. 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 Stomach/Bowel: Stomach is within normal limits. No evidence of bowel wall thickening, distention, or inflammatory changes. Vascular/Lymphatic: Aortic atherosclerosis. No aneurysm. No abdominopelvic adenopathy  Reproductive: Prostate gland appears enlarged Other: Collateral vessels along the omentum and stomach. No free fluid or fluid collections. Musculoskeletal: No acute or significant osseous findings. Schmorl's node is identified within the superior endplate of L2. IMPRESSION: 1. Stable exam compared with 06/18/2019. No suspicious mass or adenopathy identified. 2. Unchanged appearance of small fluid collection along the resection margin of the pancreas. 3. Stable small low-density structure within the uncinate process. 4. Aortic atherosclerosis. Aortic Atherosclerosis (ICD10-I70.0). Electronically Signed   By: Kerby Moors M.D.   On: 09/19/2019 13:59   US Carotid Bilateral (at [facility] and [facility] only)  Result Date: 10/09/2019 CLINICAL DATA:  Carotid atherosclerosis, known left ICA occlusion EXAM: BILATERAL CAROTID DUPLEX ULTRASOUND TECHNIQUE: Pearline Cables scale imaging, color Doppler and duplex ultrasound were performed of bilateral carotid and vertebral arteries in the neck. COMPARISON:  04/03/2019 CTA neck FINDINGS: Criteria: Quantification of carotid stenosis is based on velocity parameters that correlate the residual internal carotid diameter with NASCET-based stenosis levels, using the diameter of the distal internal carotid lumen as the denominator for stenosis measurement. The following velocity measurements were obtained: RIGHT ICA: 102/30 cm/sec CCA: [id number] cm/sec SYSTOLIC ICA/CCA RATIO:  0.7 ECA: 107 cm/sec LEFT ICA: Occluded CCA: [id number] cm/sec SYSTOLIC ICA/CCA RATIO:  Not applicable ECA: [id number] cm/sec RIGHT CAROTID ARTERY: Minor echogenic shadowing plaque formation. No hemodynamically significant right ICA stenosis, velocity elevation, or turbulent flow. Degree of narrowing less than 50%. RIGHT VERTEBRAL ARTERY:  Antegrade LEFT CAROTID ARTERY: Chronic occlusion of the left ICA. Left CCA and ECA remain patent without evidence of atherosclerotic change. LEFT VERTEBRAL ARTERY:  Antegrade IMPRESSION: Minor right ICA  narrowing, less than 50% by ultrasound criteria. Known chronic occlusion of the left ICA Patent antegrade vertebral flow bilaterally Electronically Signed   By: Jerilynn Mages.  Shick M.D.   On: 10/09/2019 09:16   ECHOCARDIOGRAM COMPLETE BUBBLE STUDY  Result Date: 10/09/2019   ECHOCARDIOGRAM REPORT   Patient Name:   BROADY ALMAS Date of Exam: 10/09/2019 Medical Rec #:  [id number]    Height:       70.0 in Accession #:    [id number]   Weight:       150.0 lb Date of Birth:  20-Dec-1955     BSA:          1.85 m Patient Age:    67 years     BP:           125/85 mmHg Patient Gender: M            HR:           86 bpm. Exam Location:  [facility] Procedure: 2D Echo, Cardiac Doppler, Color Doppler and Saline Contrast Bubble            Study Indications:     Stroke 434.91  History:         Patient has prior history of Echocardiogram examinations, most                  recent 11/12/2018. Stroke; Risk Factors:Diabetes.  Sonographer:     Sherrie Sport RDCS (AE) Referring Phys:  [place] Diagnosing Phys: Ida Rogue MD IMPRESSIONS  1. Left ventricular ejection fraction, by visual estimation, is 60 to 65%. The left  ventricle has normal function. There is no left ventricular hypertrophy.  2. The left ventricle has no regional wall motion abnormalities.  3. Left ventricular diastolic parameters are consistent with Grade I diastolic dysfunction (impaired relaxation).  4. Global right ventricle has normal systolic function.The right ventricular size is normal. No increase in right ventricular wall thickness.  5. Left atrial size was normal.  6. Normal pulmonary artery systolic pressure.  7. Negative saline contrast study. FINDINGS  Left Ventricle: Left ventricular ejection fraction, by visual estimation, is 60 to 65%. The left ventricle has normal function. The left ventricle has no regional wall motion abnormalities. There is no left ventricular hypertrophy. Left ventricular diastolic parameters are consistent with Grade I diastolic dysfunction  (impaired relaxation). Normal left atrial pressure. Right Ventricle: The right ventricular size is normal. No increase in right ventricular wall thickness. Global RV systolic function is has normal systolic function. The tricuspid regurgitant velocity is 1.86 m/s, and with an assumed right atrial pressure  of 10 mmHg, the estimated right ventricular systolic pressure is normal at 23.8 mmHg. Left Atrium: Left atrial size was normal in size. Right Atrium: Right atrial size was normal in size Pericardium: There is no evidence of pericardial effusion. Mitral Valve: The mitral valve is normal in structure. No evidence of mitral valve regurgitation. No evidence of mitral valve stenosis by observation. Tricuspid Valve: The tricuspid valve is normal in structure. Tricuspid valve regurgitation is not demonstrated. Aortic Valve: The aortic valve was not well visualized. Aortic valve regurgitation is not visualized. The aortic valve is structurally normal, with no evidence of sclerosis or stenosis. Aortic valve mean gradient measures 1.5 mmHg. Aortic valve peak gradient measures 2.4 mmHg. Aortic valve area, by VTI measures 4.40 cm. Pulmonic Valve: The pulmonic valve was normal in structure. Pulmonic valve regurgitation is not visualized. Pulmonic regurgitation is not visualized. Aorta: The aortic root, ascending aorta and aortic arch are all structurally normal, with no evidence of dilitation or obstruction. Venous: The inferior vena cava is normal in size with greater than 50% respiratory variability, suggesting right atrial pressure of 3 mmHg. IAS/Shunts: No atrial level shunt detected by color flow Doppler. Agitated saline contrast was given intravenously to evaluate for intracardiac shunting. There is no evidence of a patent foramen ovale. No ventricular septal defect is seen or detected. There is no evidence of an atrial septal defect.  LEFT VENTRICLE PLAX 2D LVIDd:         4.59 cm  Diastology LVIDs:         2.61 cm  LV  e' lateral:   12.60 cm/s LV PW:         1.03 cm  LV E/e' lateral: 5.6 LV IVS:        1.03 cm  LV e' medial:    7.62 cm/s LVOT diam:     2.10 cm  LV E/e' medial:  9.3 LV SV:         72 ml LV SV Index:   39.34 LVOT Area:     3.46 cm  RIGHT VENTRICLE RV Basal diam:  3.34 cm RV S prime:     11.50 cm/s TAPSE (M-mode): 3.5 cm LEFT ATRIUM             Index       RIGHT ATRIUM           Index LA diam:        3.70 cm 2.00 cm/m  RA Area:     14.30 cm  LA Vol (A2C):   35.4 ml 19.17 ml/m RA Volume:   36.40 ml  19.71 ml/m LA Vol (A4C):   25.7 ml 13.91 ml/m LA Biplane Vol: 30.8 ml 16.67 ml/m  AORTIC VALVE                   PULMONIC VALVE AV Area (Vmax):    3.93 cm    PV Vmax:        0.72 m/s AV Area (Vmean):   3.91 cm    PV Peak grad:   2.1 mmHg AV Area (VTI):     4.40 cm    RVOT Peak grad: 2 mmHg AV Vmax:           78.15 cm/s AV Vmean:          52.000 cm/s AV VTI:            0.167 m AV Peak Grad:      2.4 mmHg AV Mean Grad:      1.5 mmHg LVOT Vmax:         88.70 cm/s LVOT Vmean:        58.700 cm/s LVOT VTI:          0.213 m LVOT/AV VTI ratio: 1.27  AORTA Ao Root diam: 3.50 cm MITRAL VALVE                        TRICUSPID VALVE MV Area (PHT): 3.42 cm             TR Peak grad:   13.8 mmHg MV PHT:        64.38 msec           TR Vmax:        186.00 cm/s MV Decel Time: 222 msec MV E velocity: 70.80 cm/s 103 cm/s  SHUNTS MV A velocity: 93.10 cm/s 70.3 cm/s Systemic VTI:  0.21 m MV E/A ratio:  0.76       1.5       Systemic Diam: 2.10 cm  Ida Rogue MD Electronically signed by Ida Rogue MD Signature Date/Time: 10/09/2019/7:37:12 PM    Final     CVA (cerebral vascular accident) [facility]) 64 year old male patient with a history of pancreatic cancer is currently admitted to hospital for mental status changes-MRI without contrast suggestive of stroke; additional lesion concerning for metastatic lesion.   # Pancreatic adenocarcinoma-stage II status post Whipple's-s/p adjuvant chemotherapy.  Most recent imaging January 2021  negative from recurrence.  Clinically it is less likely for pancreatic cancer to cause brain metastases.  However I think it is reasonable to proceed with MRI with and without contrast for further evaluation of the concerning lesion the right temporal lobe.  Check CA 19-9.  #Acute stroke-bilateral-reviewed/and appreciate neurology evaluation recommendations.  Given the likely absence of active metastatic disease it is less likely pancreatic cancer is causing hypercoagulable state.  Defer to neurology for further recommendations.  Thank you Dr.Joseph for allowing me to participate in the care of your pleasant patient. Please do not hesitate to contact me with questions or concerns in the interim.  The above plan of care was discussed with the patient in detail.  He is in agreement.  Also discussed with patient's primary oncologist Dr. Delton Coombes.   All questions were answered. The patient knows to call the clinic with any problems, questions or concerns.   Cammie Sickle, MD 10/09/2019 8:07 PM

## 2019-10-09 NOTE — ED Notes (Signed)
Pt transported to ultrasound at this time 

## 2019-10-09 NOTE — Progress Notes (Signed)
*  PRELIMINARY RESULTS* Echocardiogram 2D Echocardiogram has been performed.  Sherrie Sport 10/09/2019, 12:03 PM

## 2019-10-09 NOTE — Consult Note (Signed)
Requesting Physician: Broadus John    Chief Complaint: Syncope  I have been asked by Dr. Broadus John to see this patient in consultation for stroke.  HPI: Mark Foster is an 64 y.o. male with medical history significant of DM, Vitamin D deficiency, pancreatic cancer (last chemo 4 weeks ago), hypothyroidism, diabetic neuropathy, stroke (2011 with stenosis of left vertebral artery and carotid occlusion) and HTN who presents after a syncopal episode.  Patient reports that he had just finished playing his guitar, went to the bar and was noted to have a syncopal episode.  Patient reports no premonitory symptoms.  Was not noted to have tonic-clonic activity, tongue biting or bowel/bladder incontinence.  Was unresponsive for about 2 minutes and afterward was lethargic and disoriented.  Patient brought in by EMS at was at baseline at presentation. At baseline patient has had balance deficits for the past 4 years or so and ambulates with a cane. Initial NIHSS of 0      Date last known well: 10/08/2019 Time last known well: Time: 19:30 tPA Given: No: No significant disability  Past Medical History:  Diagnosis Date  . Cervical compression fracture ([place])   . Cervical spinal stenosis   . Diabetic neuropathy (HCC)    Bilateral legs  . Diverticulitis   . Family history of pancreatic cancer   . History of kidney stones   . Hypothyroidism   . Pancreatic cancer ([place])   . Stenosis of left vertebral artery   . Stroke [facility])    2011  . Type 2 diabetes mellitus ([place])     Past Surgical History:  Procedure Laterality Date  . APPENDECTOMY    . COLON RESECTION     For diverticulitis  . COLON SURGERY    . ESOPHAGOGASTRODUODENOSCOPY N/A 10/03/2018   Procedure: ESOPHAGOGASTRODUODENOSCOPY (EGD);  Surgeon: [name], MD;  Location: [name];  Service: Endoscopy;  Laterality: N/A;  . EUS N/A 10/03/2018   Procedure: UPPER ENDOSCOPIC ULTRASOUND (EUS) RADIAL;  Surgeon: [name], MD;  Location: [facility]  ENDOSCOPY;  Service: Endoscopy;  Laterality: N/A;  . FINE NEEDLE ASPIRATION N/A 10/03/2018   Procedure: FINE NEEDLE ASPIRATION (FNA) LINEAR;  Surgeon: [name], MD;  Location: [facility] END[facility];  Service: Endoscopy;  Laterality: N/A;  . IR ANGIO INTRA EXTRACRAN SEL COM CAROTID INNOMINATE BILAT MOD SED  01/21/2018  . IR ANGIO INTRA EXTRACRAN SEL COM CAROTID INNOMINATE UNI R MOD SED  03/21/2018  . IR ANGIO VERTEBRAL SEL VERTEBRAL BILAT MOD SED  01/21/2018  . IR TRANSCATH EXCRAN VERT OR CAR A STENT  03/21/2018  . PORTACATH PLACEMENT Left 12/23/2018   Procedure: INSERTION PORT-A-CATH;  Surgeon: Virl Cagey, MD;  Location: [facility];  Service: General;  Laterality: Left;  . RADIOLOGY WITH ANESTHESIA N/A 03/21/2018   Procedure: IR WITH ANESTHESIA WITH STENT PLACEMENT;  Surgeon: Luanne Bras, MD;  Location: [place];  Service: Radiology;  Laterality: N/A;  . ROTATOR CUFF REPAIR     Left    Family History  Problem Relation Age of Onset  . Stroke Mother   . Pancreatic cancer Father 65       d. 99  . Stroke Maternal Grandmother   . Heart attack Maternal Grandfather   . Heart Problems Paternal Grandfather   . Scoliosis Daughter   . Muscular dystrophy Grandson    Social History:  reports that he has been smoking. He has never used smokeless tobacco. He reports previous alcohol use. He reports that he does not use drugs.  Allergies:  Allergies  Allergen Reactions  . Demerol [Meperidine Hcl] Other (See Comments)    convulsions    Medications:  I have reviewed the patient's current medications. Prior to Admission: Prior to Admission medications   Medication Sig Start Date End Date Taking? Authorizing Provider  aspirin 81 MG tablet Take 1 tablet (81 mg total) by mouth daily. 03/22/18  Yes Louk, Alexandra M, PA-C  atorvastatin (LIPITOR) 80 MG tablet TAKE 1 TABLET(80 MG) BY MOUTH DAILY AT 6 PM 08/09/19  Yes Gosrani, Nimish C, MD  ECHINACEA EXTRACT PO Take 2 tablets by mouth daily.    Yes  [provider]  gabapentin (NEURONTIN) 300 MG capsule Take 1 capsule (300 mg total) by mouth 2 (two) times daily. 01/13/19  Yes McCue, Janett Billow, NP  Insulin Glargine (BASAGLAR KWIKPEN) 100 UNIT/ML SOPN INJECT 42 UNITS INTO THE SKIN AT BEDTIME 08/21/19  Yes Nida, Marella Chimes, MD  loperamide (IMODIUM A-D) 2 MG tablet Take 1 tablet (2 mg total) by mouth as needed. Take 2 at diarrhea onset , then 1 every 2hr until 12hrs with no BM. May take 2 every 4hrs at night. If diarrhea recurs repeat. 12/19/18  Yes Derek Jack, MD  meclizine (ANTIVERT) 25 MG tablet Take 1 tablet (25 mg total) by mouth 3 (three) times daily as needed for dizziness. 04/23/19  Yes Derek Jack, MD  NP THYROID 30 MG tablet Take 30 mg by mouth daily before breakfast.  01/17/18  Yes [provider]  Omega-3 1000 MG CAPS Take 1,000 mg by mouth daily.   Yes [provider]  ondansetron (ZOFRAN) 8 MG tablet Take 1 tablet (8 mg total) by mouth every 6 (six) hours as needed for nausea or vomiting. 04/11/19  Yes Roger Shelter, FNP  pantoprazole (PROTONIX) 20 MG tablet Take 20 mg by mouth as needed.  11/25/18 11/25/19 Yes [provider]  sertraline (ZOLOFT) 50 MG tablet TAKE 1 TABLET BY MOUTH DAILY 07/05/19  Yes Ailene Ards, NP  tamsulosin (FLOMAX) 0.4 MG CAPS capsule Take 0.4 mg by mouth daily.  10/01/18  Yes [provider]  traMADol (ULTRAM) 50 MG tablet Take 1 tablet (50 mg total) by mouth every 6 (six) hours as needed. 09/27/19  Yes Derek Jack, MD  vitamin B-12 (CYANOCOBALAMIN) 1000 MCG tablet Take 1,000 mcg by mouth daily.   Yes [provider]  Insulin Pen Needle 32G X 4 MM MISC 1 Device by Does not apply route daily. 01/22/18   Donzetta Starch, NP  atorvastatin (LIPITOR) 80 MG tablet Take 1 tablet (80 mg total) by mouth daily at 6 PM. 05/15/19   Doree Albee, MD     Scheduled: .  stroke: mapping our early stages of recovery book   Does not apply Once  .  aspirin  300 mg Rectal Daily   Or  . aspirin EC  325 mg Oral Daily  . atorvastatin  80 mg Oral q1800  . insulin aspart  0-9 Units Subcutaneous Q4H  . insulin glargine  32 Units Subcutaneous Q2200  . pantoprazole  20 mg Oral Daily  . sertraline  50 mg Oral Daily  . tamsulosin  0.4 mg Oral Daily  . thyroid  30 mg Oral QAC breakfast    ROS: History obtained from the patient  General ROS: negative for - chills, fatigue, fever, night sweats, weight gain or weight loss Psychological ROS: negative for - behavioral disorder, hallucinations, memory difficulties, mood swings or suicidal ideation Ophthalmic ROS: negative  for - blurry vision, double vision, eye pain or loss of vision ENT ROS: intermittent vertigo Allergy and Immunology ROS: negative for - hives or itchy/watery eyes Hematological and Lymphatic ROS: negative for - bleeding problems, bruising or swollen lymph nodes Endocrine ROS: negative for - galactorrhea, hair pattern changes, polydipsia/polyuria or temperature intolerance Respiratory ROS: negative for - cough, hemoptysis, shortness of breath or wheezing Cardiovascular ROS: negative for - chest pain, dyspnea on exertion, edema or irregular heartbeat Gastrointestinal ROS: negative for - abdominal pain, diarrhea, hematemesis, nausea/vomiting or stool incontinence Genito-Urinary ROS: negative for - dysuria, hematuria, incontinence or urinary frequency/urgency Musculoskeletal ROS: negative for - joint swelling or muscular weakness Neurological ROS: as noted in HPI Dermatological ROS: negative for rash and skin lesion changes  Physical Examination: Blood pressure 125/85, pulse 86, temperature 97.8 F (36.6 C), resp. rate 13, height 5\' 10"  (1.778 m), weight 68 kg, SpO2 94 %.  HEENT-  Normocephalic, no lesions, without obvious abnormality.  Normal external eye and conjunctiva.  Normal TM's bilaterally.  Normal auditory canals and external ears. Normal external nose, mucus membranes  and septum.  Normal pharynx. Cardiovascular- S1, S2 normal, pulses palpable throughout   Lungs- chest clear, no wheezing, rales, normal symmetric air entry Abdomen- soft, non-tender; bowel sounds normal; no masses,  no organomegaly Extremities- no edema Lymph-no adenopathy palpable Musculoskeletal-no joint tenderness, deformity or swelling Skin-warm and dry, no hyperpigmentation, vitiligo, or suspicious lesions  Neurological Examination   Mental Status: Alert, oriented, thought content appropriate.  Speech fluent without evidence of aphasia.  Able to follow 3 step commands without difficulty. Cranial Nerves: II: Discs flat bilaterally; Visual fields grossly normal, pupils equal, round, reactive to light and accommodation III,IV, VI: ptosis not present, extra-ocular motions intact bilaterally V,VII: smile symmetric, facial light touch sensation normal bilaterally VIII: hearing normal bilaterally IX,X: gag reflex present XI: bilateral shoulder shrug XII: midline tongue extension Motor: Right : Upper extremity   5/5    Left:     Upper extremity   5/5  Lower extremity   5/5     Lower extremity   5/5 Tone and bulk:normal tone throughout; no atrophy noted Sensory: Pinprick and light touch intact throughout, bilaterally Deep Tendon Reflexes: Symmetric throughout Plantars: Right: mute   Left: mute Cerebellar: Normal finger-to-nose and normal heel-to-shin testing bilaterally Gait: not tested due to safety concerns    Laboratory Studies:  Basic Metabolic Panel: Recent Labs  Lab 10/08/19 2009  NA 137  K 3.5  CL 102  CO2 26  GLUCOSE 251*  BUN 21  CREATININE 0.70  CALCIUM 8.7*    Liver Function Tests: Recent Labs  Lab 10/08/19 2009  AST 18  ALT 14  ALKPHOS 56  BILITOT 0.8  PROT 6.4*  ALBUMIN 3.7   No results for input(s): LIPASE, AMYLASE in the last 168 hours. No results for input(s): AMMONIA in the last 168 hours.  CBC: Recent Labs  Lab 10/08/19 2009  WBC 9.2   HGB 14.9  HCT 42.9  MCV 91.1  PLT 215    Cardiac Enzymes: No results for input(s): CKTOTAL, CKMB, CKMBINDEX, TROPONINI in the last 168 hours.  BNP: Invalid input(s): POCBNP  CBG: Recent Labs  Lab 10/09/19 0332 10/09/19 0842 10/09/19 1204  GLUCAP 313* 173* 376*    Microbiology: Results for orders placed or performed during the hospital encounter of 10/08/19  Respiratory Panel by RT PCR (Flu A&B, Covid) - Nasopharyngeal Swab     Status: None   Collection Time: 10/08/19 11:12 PM  Specimen: Nasopharyngeal Swab  Result Value Ref Range Status   SARS Coronavirus 2 by RT PCR NEGATIVE NEGATIVE Final    Comment: (NOTE) SARS-CoV-2 target nucleic acids are NOT DETECTED. The SARS-CoV-2 RNA is generally detectable in upper respiratoy specimens during the acute phase of infection. The lowest concentration of SARS-CoV-2 viral copies this assay can detect is 131 copies/mL. A negative result does not preclude SARS-Cov-2 infection and should not be used as the sole basis for treatment or other patient management decisions. A negative result may occur with  improper specimen collection/handling, submission of specimen other than nasopharyngeal swab, presence of viral mutation(s) within the areas targeted by this assay, and inadequate number of viral copies (<131 copies/mL). A negative result must be combined with clinical observations, patient history, and epidemiological information. The expected result is Negative. Fact Sheet for Patients:  PinkCheek.be Fact Sheet for Healthcare Providers:  GravelBags.it This test is not yet ap proved or cleared by the [place] FDA and  has been authorized for detection and/or diagnosis of SARS-CoV-2 by FDA under an Emergency Use Authorization (EUA). This EUA will remain  in effect (meaning this test can be used) for the duration of the COVID-19 declaration under Section 564(b)(1) of the  Act, 21 U.S.C. section 360bbb-3(b)(1), unless the authorization is terminated or revoked sooner.    Influenza A by PCR NEGATIVE NEGATIVE Final   Influenza B by PCR NEGATIVE NEGATIVE Final    Comment: (NOTE) The Xpert Xpress SARS-CoV-2/FLU/RSV assay is intended as an aid in  the diagnosis of influenza from Nasopharyngeal swab specimens and  should not be used as a sole basis for treatment. Nasal washings and  aspirates are unacceptable for Xpert Xpress SARS-CoV-2/FLU/RSV  testing. Fact Sheet for Patients: PinkCheek.be Fact Sheet for Healthcare Providers: GravelBags.it This test is not yet approved or cleared by the [place] FDA and  has been authorized for detection and/or diagnosis of SARS-CoV-2 by  FDA under an Emergency Use Authorization (EUA). This EUA will remain  in effect (meaning this test can be used) for the duration of the  Covid-19 declaration under Section 564(b)(1) of the Act, 21  U.S.C. section 360bbb-3(b)(1), unless the authorization is  terminated or revoked. Performed at [facility] [address] [place], [place] [address]     Coagulation Studies: No results for input(s): LABPROT, INR in the last 72 hours.  Urinalysis:  Recent Labs  Lab 10/09/19 0356  COLORURINE YELLOW*  LABSPEC 1.030  PHURINE 5.0  GLUCOSEU >=500*  HGBUR NEGATIVE  BILIRUBINUR NEGATIVE  KETONESUR NEGATIVE  PROTEINUR NEGATIVE  NITRITE NEGATIVE  LEUKOCYTESUR NEGATIVE    Lipid Panel:    Component Value Date/Time   CHOL 186 10/09/2019 0159   TRIG 105 10/09/2019 0159   HDL 42 10/09/2019 0159   CHOLHDL 4.4 10/09/2019 0159   VLDL 21 10/09/2019 0159   LDLCALC 123 (H) 10/09/2019 0159    HgbA1C:  Lab Results  Component Value Date   HGBA1C 12.1 (H) 10/08/2019    Urine Drug Screen:      Component Value Date/Time   LABOPIA NONE DETECTED 11/12/2018 1830   COCAINSCRNUR NONE DETECTED 11/12/2018 1830    LABBENZ NONE DETECTED 11/12/2018 1830   AMPHETMU NONE DETECTED 11/12/2018 1830   THCU NONE DETECTED 11/12/2018 1830   LABBARB NONE DETECTED 11/12/2018 1830    Alcohol Level: No results for input(s): ETH in the last 168 hours.  Other results: EKG: sinus rhythm at 96 bpm.  Imaging: DG Chest 1 View  Result Date: 10/09/2019 CLINICAL DATA:  Stroke-like symptoms EXAM: CHEST  1 VIEW COMPARISON:  12/23/2018 FINDINGS: Cardiac shadow is within normal limits. Lungs are well aerated without focal infiltrate or sizable. Left chest wall port is superior cava. No bony is seen. IMPRESSION: No acute abnormality noted. Electronically Signed   By: Inez Catalina M.D.   On: 10/09/2019 00:19   CT Head Wo Contrast  Result Date: 10/08/2019 CLINICAL DATA:  Syncope EXAM: CT HEAD WITHOUT CONTRAST TECHNIQUE: Contiguous axial images were obtained from the base of the skull through the vertex without intravenous contrast. COMPARISON:  04/03/2019 FINDINGS: Brain: There is no acute intracranial hemorrhage, mass-effect, or edema. There is no new loss of gray differentiation. Left parietal chronic infarct again noted. New age-indeterminate small vessel infarcts of the left caudate and left cerebellum. Additional patchy hypoattenuation in the supratentorial white matter is nonspecific but may reflect minor chronic microvascular ischemic changes. There is no extra-axial fluid collection. Ventricles and sulci are within normal limits in size and configuration. Vascular: There is atherosclerotic calcification at the skull base. Skull: Calvarium is unremarkable. Sinuses/Orbits: Mild ethmoid sinus mucosal thickening. Orbits are unremarkable. Other: Mastoid air cells are clear. IMPRESSION: No acute intracranial hemorrhage or mass effect. New age-indeterminate small vessel infarcts of the left caudate and left cerebellum. Chronic left parietal infarct. Electronically Signed   By: Macy Mis M.D.   On: 10/08/2019 20:42   MR BRAIN WO  CONTRAST  Result Date: 10/08/2019 CLINICAL DATA:  Stroke follow-up. Syncope. EXAM: MRI HEAD WITHOUT CONTRAST TECHNIQUE: Multiplanar, multiecho pulse sequences of the brain and surrounding structures were obtained without intravenous contrast. COMPARISON:  Brain MRI 05/19/2019 FINDINGS: Brain: There are punctate foci of diffusion restriction within the bilateral frontal white matter. There is an area of milder diffusion abnormality within the posterior right parietal lobe. Area of encephalomalacia along the left postcentral gyrus is unchanged. There is a new focus of hyperintense T2-weighted signal within the right temporal lobe (series 15 image 21). Small focus of hemosiderin deposition in the left cerebellum. Vascular: Unchanged abnormal left internal carotid artery flow void. Skull and upper cervical spine:  Normal bone marrow signal. Sinuses/Orbits: Small amount of left mastoid fluid. Normal orbits. Other: IMPRESSION: 1. Multifocal abnormal diffusion restriction within the bilateral frontal white matter and within the posterior right parietal lobe, likely indicating acute to subacute infarcts. 2. New focus of hyperintense T2-weighted signal within the right temporal lobe, which may be a late subacute infarct. However, a small metastatic lesion could also have this appearance. Further imaging with intravenous contrast might be helpful for further evaluation. 3. Unchanged abnormal left internal carotid artery flow void, consistent with occlusion. 4. Unchanged encephalomalacia along the left postcentral gyrus. Electronically Signed   By: Ulyses Jarred M.D.   On: 10/08/2019 23:16   US Carotid Bilateral (at [facility] and [facility] only)  Result Date: 10/09/2019 CLINICAL DATA:  Carotid atherosclerosis, known left ICA occlusion EXAM: BILATERAL CAROTID DUPLEX ULTRASOUND TECHNIQUE: Pearline Cables scale imaging, color Doppler and duplex ultrasound were performed of bilateral carotid and vertebral arteries in the neck. COMPARISON:   04/03/2019 CTA neck FINDINGS: Criteria: Quantification of carotid stenosis is based on velocity parameters that correlate the residual internal carotid diameter with NASCET-based stenosis levels, using the diameter of the distal internal carotid lumen as the denominator for stenosis measurement. The following velocity measurements were obtained: RIGHT ICA: 102/30 cm/sec CCA: [id number] cm/sec SYSTOLIC ICA/CCA RATIO:  0.7 ECA: 107 cm/sec LEFT ICA: Occluded CCA: [id number] cm/sec SYSTOLIC ICA/CCA RATIO:  Not  applicable ECA: [id number] cm/sec RIGHT CAROTID ARTERY: Minor echogenic shadowing plaque formation. No hemodynamically significant right ICA stenosis, velocity elevation, or turbulent flow. Degree of narrowing less than 50%. RIGHT VERTEBRAL ARTERY:  Antegrade LEFT CAROTID ARTERY: Chronic occlusion of the left ICA. Left CCA and ECA remain patent without evidence of atherosclerotic change. LEFT VERTEBRAL ARTERY:  Antegrade IMPRESSION: Minor right ICA narrowing, less than 50% by ultrasound criteria. Known chronic occlusion of the left ICA Patent antegrade vertebral flow bilaterally Electronically Signed   By: Jerilynn Mages.  Shick M.D.   On: 10/09/2019 09:16    Assessment: 64 y.o. male with medical history significant of DM, vitamin D deficiency, pancreatic cancer (last chemo 4 weeks ago), hypothyroidism, diabetic neuropathy, stroke (2011 with stenosis of left vertebral artery and carotid occlusion) and HTN who presents after a syncopal episode.  MRI of the brain performed and personally reviewed.  MRI shows multiple, small bilateral frontal white matter and posterior right parietal lobe acute infarcts.  Etiology likely cardioembolic and amy be related to hypercoagulability from his cancer.  There is also a concerning area in the right temporal lobe that although may be infarct as well, can not rule out the possibility of a metastatic lesion. Encephalomalacia noted in the left postcentral gyrus and is likely a consequence of his past  infarct.  Carotid dopplers continue to show occluded left ICA.  No hemodynamically significant RICA stenosis noted.  LDL 123.  A1c 12.1.  Patient admits to medication noncompliance.   Patient on ASA and statin prior to admission. Unclear etiology of syncopal episode.  May have been related to embolic nature of acute infarcts.  Patient also with left temporal encephalomalacia that puts him at risk for seizure.  Without definitive evidence of seizure, anticonvulsant therapy not indicated at this time but further work up indicated.     Stroke Risk Factors - diabetes mellitus, hyperlipidemia, hypertension and smoking  Plan: 1. Agree with MRI of the brain with contrast to further evaluate right temporal lobe abnormality.  2. PT consult, OT consult, Speech consult 3. Echocardiogram with bubble study pending 4. Prophylactic therapy-Dual antiplatelet therapy with ASA 81mg  and Plavix 75mg  for three weeks with change to Plavix 75mg  daily alone as monotherapy after that time. 5. NPO until RN stroke swallow screen 6. Telemetry monitoring.  If inpatient work up unremarkable patient may benefit from prolonged monitoring on an outpatient basis.   7. Frequent neuro checks 8. Blood sugar management with target A1c<7.0.  Medication compliance stressed 9. Aggressive lipid management with target LDL<70.  Again medication compliance stressed. 10. Orthostatic vitals 11. EEG 12. Smoking cessation counseling   Alexis Goodell, MD Neurology [phone] 10/09/2019, 12:13 PM

## 2019-10-09 NOTE — Evaluation (Signed)
Physical Therapy Evaluation Patient Details Name: Mark Foster MRN: [id number] DOB: [birth year] Today's Date: 10/09/2019   History of Present Illness  64 y.o. male with medical history significant of DM2.  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  Here after syncopal episode with apart LOC, MRI of brain reveals likely small infarct.  Clinical Impression  Pt reports that he is generally able to be active and independent and feels confident that with minimal changes should be able to return to his independent activity level w/o a lot of intervention.  Overall he was confident with mobility, static balance and was able to ambulate ~400 ft with SPC displaying relatively consistent cadence though he did have limp on R LE much of the time (reports subacute R calf pain, negative for redness/swelling/increased temp, but painful stretch and palpation).  Pt overall reports feeling good about going home and though he does not have a lot of readily available assistance he is confident he could get help with running errands, etc if he needed some initially.  Pt acknowledges that he is not quite walking at his baseline/has some unsteadiness and may be open to HHPT, though his initial mindset was to get back to his normal on his own and hopes not to need PT at home.    Follow Up Recommendations Home health PT(per progress he may not be interested)    Equipment Recommendations  None recommended by PT    Recommendations for Other Services       Precautions / Restrictions Precautions Precautions: Fall(low) Restrictions Weight Bearing Restrictions: No      Mobility  Bed Mobility Overal bed mobility: Independent             General bed mobility comments: Pt able to get himself to sitting EOB w/o assist  Transfers Overall transfer level: Modified independent Equipment used: Straight cane              General transfer comment: Pt was able to rise to standing without hesistation of safety concerns  Ambulation/Gait Ambulation/Gait assistance: Supervision Gait Distance (Feet): 400 Feet Assistive device: Straight cane       General Gait Details: Pt was able to ambulate with consistent cadence and good confidence.  He did have limp with R LE that he reports is not too far from bas  Stairs            Wheelchair Mobility    Modified Rankin (Stroke Patients Only)       Balance Overall balance assessment: Modified Independent                                           Pertinent Vitals/Pain Pain Assessment: Faces(R calf pain, incrased with WBing/pressure) Faces Pain Scale: Hurts little more    Home Living Family/patient expects to be discharged to:: Private residence Living Arrangements: Alone Available Help at Discharge: (reports minimal help avail, but could find someone if needed) Type of Home: House Home Access: Stairs to enter Entrance Stairs-Rails: ("yes") Entrance Stairs-Number of Steps: (3) Home Layout: One level Home Equipment: Cane - quad;Cane - single point;Walker - 2 wheels      Prior Function Level of Independence: Independent with assistive device(s)         Comments: community ambulator with quad cane, drives, still rides motorcyle  Hand Dominance        Extremity/Trunk Assessment   Upper Extremity Assessment Upper Extremity Assessment: Overall WFL for tasks assessed    Lower Extremity Assessment Lower Extremity Assessment: Overall WFL for tasks assessed;Generalized weakness(R calf pain limited but WFL t/o)       Communication   Communication: No difficulties(subjectively reports minimal word finding delay at times)  Cognition Arousal/Alertness: Awake/alert Behavior During Therapy: WFL for tasks assessed/performed Overall Cognitive Status: Within Functional Limits for tasks assessed                                         General Comments      Exercises     Assessment/Plan    PT Assessment Patient needs continued PT services  PT Problem List Decreased strength;Decreased activity tolerance;Decreased balance;Decreased mobility;Decreased coordination;Decreased safety awareness;Decreased knowledge of use of DME;Cardiopulmonary status limiting activity       PT Treatment Interventions DME instruction;Gait training;Stair training;Functional mobility training;Therapeutic activities;Therapeutic exercise;Balance training;Neuromuscular re-education;Patient/family education    PT Goals (Current goals can be found in the Care Plan section)  Acute Rehab PT Goals Patient Stated Goal: Go home PT Goal Formulation: With patient Time For Goal Achievement: 10/23/19 Potential to Achieve Goals: Fair    Frequency 7X/week   Barriers to discharge        Co-evaluation               AM-PAC PT "6 Clicks" Mobility  Outcome Measure Help needed turning from your back to your side while in a flat bed without using bedrails?: None Help needed moving from lying on your back to sitting on the side of a flat bed without using bedrails?: None Help needed moving to and from a bed to a chair (including a wheelchair)?: None Help needed standing up from a chair using your arms (e.g., wheelchair or bedside chair)?: None Help needed to walk in hospital room?: A Little Help needed climbing 3-5 steps with a railing? : A Little 6 Click Score: 22    End of Session Equipment Utilized During Treatment: Gait belt Activity Tolerance: Patient tolerated treatment well Patient left: in bed;with call bell/phone within reach;with nursing/sitter in room Nurse Communication: Mobility status PT Visit Diagnosis: Unsteadiness on feet (R26.81);Difficulty in walking, not elsewhere classified (R26.2);Muscle weakness (generalized) (M62.81)    Time: [id number] PT Time Calculation (min) (ACUTE ONLY): 25  min   Charges:   PT Evaluation $PT Eval Low Complexity: 1 Low PT Treatments $Gait Training: 8-22 mins        Kreg Shropshire, DPT 10/09/2019, 12:33 PM

## 2019-10-09 NOTE — Procedures (Signed)
ELECTROENCEPHALOGRAM REPORT   Patient: Mark Foster       Room #: [id number] EEG No. ID: [id number] Age: 64 y.o.        Sex: male Requesting Physician: Broadus John Report Date:  10/09/2019        Interpreting Physician: Alexis Goodell  History: [name] is an 64 y.o. male with syncope  Medications:  ASA, Thyroid Armour, Lipitor, Zoloft, Flomax, Insulin  Conditions of Recording:  This is a 21 channel routine scalp EEG performed with bipolar and monopolar montages arranged in accordance to the international 10/20 system of electrode placement. One channel was dedicated to EKG recording.  The patient is in the awake, drowsy and asleep states.  Description:  The waking background activity consists of a low voltage, symmetrical, fairly well organized, 9-10 Hz alpha activity, seen from the parieto-occipital and posterior temporal regions.  Low voltage fast activity, poorly organized, is seen anteriorly and is at times superimposed on more posterior regions.  A mixture of theta and alpha rhythms are seen from the central and temporal regions. The patient drowses with slowing to irregular, low voltage theta and beta activity.   The patient goes in to a light sleep with symmetrical sleep spindles, vertex central sharp transients and irregular slow activity.  No epileptiform activity is noted.   Hyperventilation was not performed.  Intermittent photic stimulation was performed but failed to illicit any change in the tracing.     IMPRESSION: Normal electroencephalogram, awake, asleep and with activation procedures. There are no focal lateralizing or epileptiform features.   Alexis Goodell, MD Neurology [phone] 10/09/2019, 4:11 PM

## 2019-10-09 NOTE — Progress Notes (Signed)
SLP Cancellation Note  Patient Details Name: Mark Foster MRN: [id number] DOB: July 11, 1956   Cancelled treatment:       Reason Eval/Treat Not Completed: SLP screened, no needs identified, will sign off(chart reviewed; consulted NSG then met w/ pt in room). Pt denied any difficulty swallowing and is currently on a regular diet; tolerates swallowing pills w/ water per NSG. Pt conversed in conversational w/out gross, overt deficits noted; pt and NSG denied any acute speech-language deficits. Pt endorsed that over the past year he has experienced "some forgetfulness" w/ finding a word or where he has placed his car keys. Briefly discussed strategies of reducing distractions during conversation, slowing down, taking a deep breath to calm during conversation at times, and describing the word/object/action if unable to recall it. Encouraged he create a routine w/ his keys. Gave pt a contact card to reach out to Outpatient services if needed in future. Pt agreed.  No further skilled ST services indicated as pt appears at his baseline; noted recent MRI results. Pt agreed. NSG to reconsult if any change in status while admitted.     Orinda Kenner, MS, CCC-SLP Watson,Katherine 10/09/2019, 11:12 AM

## 2019-10-09 NOTE — Progress Notes (Signed)
PROGRESS NOTE    Deondre Men  [id number] DOB: 12/10/1955 DOA: 10/08/2019 PCP: Doree Albee, MD  Brief Narrative: Absalom Mackley is a 64 year old male with history of type 2 diabetes mellitus, left carotid artery occlusion, history of pancreatic cancer underwent Whipple surgery in 2020 at [facility], treated with chemotherapy approximately 3 months ago, history of TIA versus CVA in 2011, hypertension, went to a bar yesterday for open [employer], apparently finished playing his guitar and then does not recall anything that happened thereafter he reportedly had a syncopal event and he remembers waking up when EMS was transporting him to the hospital, reports being groggy and lethargic when he regained consciousness.  No overt seizure activity was noted by bystanders -Work-up in the emergency room noted multiple small bilateral frontal white matter and posterior right parietal lobe infarcts, in addition there was a right temporal area with questionable lesion which could not rule out metastatic disease.   Assessment & Plan:  Embolic stroke -multiple small bilateral frontal white matter and posterior right parietal lobe infarcts -In addition questionable area of attenuation in the right temporal lobe -Will request neurology consult -Will need MRI brain with contrast -Also need EEG to rule out seizure, given syncopal event prior to admission this is a high consideration -Follow-up carotid duplex and echocardiogram -LDL is 123, hemoglobin A1c is pending -On aspirin 325 mg at baseline, this was continued -PT OT  Syncope -Possible seizure -Follow-up EEG, neurology consulting  Type 2 diabetes mellitus -Follow-up hemoglobin A1c, continue home insulin regimen  History of pancreatic cancer -Questionable MRI brain lesion, follow-up MRI with contrast -Will need close follow-up with oncology  Hypothyroidism -Continue Synthroid  Hypertension -Hold antihypertensives to allow for permissive  hypertension  Tobacco abuse -Counseled   DVT prophylaxis: Add Lovenox Code Status: Full code Family Communication: No family at bedside Disposition Plan: Home pending stroke/seizure work-up  Consultants:   Neurology   Procedures:   Antimicrobials:    Subjective: -Feels okay, denies any deficits at this time  Objective: Vitals:   10/09/19 1230 10/09/19 1300 10/09/19 1400 10/09/19 1430  BP: 133/87 124/76 118/72 129/83  Pulse: 76 70 74 80  Resp: 18 14 12 16   Temp:      SpO2: 96% 97% 98% 97%  Weight:      Height:       No intake or output data in the 24 hours ending 10/09/19 1514 Filed Weights   10/08/19 2001  Weight: 68 kg    Examination:  General exam: Pleasant thinly built male sitting up in bed, AAOx3, no distress Respiratory system: Poor air movement bilaterally otherwise clear. Cardiovascular system: S1 & S2 heard, RRR Gastrointestinal system: Abdomen is nondistended, soft and nontender.Normal bowel sounds heard. Central nervous system: Alert and oriented. No focal neurological deficits. Extremities: No edema Skin: No rashes, lesions or ulcers Psychiatry: Mood & affect appropriate.     Data Reviewed:   CBC: Recent Labs  Lab 10/08/19 2009  WBC 9.2  HGB 14.9  HCT 42.9  MCV 91.1  PLT 123456   Basic Metabolic Panel: Recent Labs  Lab 10/08/19 2009  NA 137  K 3.5  CL 102  CO2 26  GLUCOSE 251*  BUN 21  CREATININE 0.70  CALCIUM 8.7*   GFR: Estimated Creatinine Clearance: 89.7 mL/min (by C-G formula based on SCr of 0.7 mg/dL). Liver Function Tests: Recent Labs  Lab 10/08/19 2009  AST 18  ALT 14  ALKPHOS 56  BILITOT 0.8  PROT 6.4*  ALBUMIN 3.7  No results for input(s): LIPASE, AMYLASE in the last 168 hours. No results for input(s): AMMONIA in the last 168 hours. Coagulation Profile: No results for input(s): INR, PROTIME in the last 168 hours. Cardiac Enzymes: No results for input(s): CKTOTAL, CKMB, CKMBINDEX, TROPONINI in the last  168 hours. BNP (last 3 results) No results for input(s): PROBNP in the last 8760 hours. HbA1C: Recent Labs    10/08/19 2009  HGBA1C 12.1*   CBG: Recent Labs  Lab 10/09/19 0332 10/09/19 0842 10/09/19 1204  GLUCAP 313* 173* 376*   Lipid Profile: Recent Labs    10/09/19 0159  CHOL 186  HDL 42  LDLCALC 123*  TRIG 105  CHOLHDL 4.4   Thyroid Function Tests: No results for input(s): TSH, T4TOTAL, FREET4, T3FREE, THYROIDAB in the last 72 hours. Anemia Panel: No results for input(s): VITAMINB12, FOLATE, FERRITIN, TIBC, IRON, RETICCTPCT in the last 72 hours. Urine analysis:    Component Value Date/Time   COLORURINE YELLOW (A) 10/09/2019 0356   APPEARANCEUR CLEAR (A) 10/09/2019 0356   LABSPEC 1.030 10/09/2019 0356   PHURINE 5.0 10/09/2019 0356   GLUCOSEU >=500 (A) 10/09/2019 0356   HGBUR NEGATIVE 10/09/2019 0356   BILIRUBINUR NEGATIVE 10/09/2019 0356   KETONESUR NEGATIVE 10/09/2019 0356   PROTEINUR NEGATIVE 10/09/2019 0356   NITRITE NEGATIVE 10/09/2019 0356   LEUKOCYTESUR NEGATIVE 10/09/2019 0356   Sepsis Labs: @LABRCNTIP (procalcitonin:4,lacticidven:4)  ) Recent Results (from the past 240 hour(s))  Respiratory Panel by RT PCR (Flu A&B, Covid) - Nasopharyngeal Swab     Status: None   Collection Time: 10/08/19 11:12 PM   Specimen: Nasopharyngeal Swab  Result Value Ref Range Status   SARS Coronavirus 2 by RT PCR NEGATIVE NEGATIVE Final    Comment: (NOTE) SARS-CoV-2 target nucleic acids are NOT DETECTED. The SARS-CoV-2 RNA is generally detectable in upper respiratoy specimens during the acute phase of infection. The lowest concentration of SARS-CoV-2 viral copies this assay can detect is 131 copies/mL. A negative result does not preclude SARS-Cov-2 infection and should not be used as the sole basis for treatment or other patient management decisions. A negative result may occur with  improper specimen collection/handling, submission of specimen other than  nasopharyngeal swab, presence of viral mutation(s) within the areas targeted by this assay, and inadequate number of viral copies (<131 copies/mL). A negative result must be combined with clinical observations, patient history, and epidemiological information. The expected result is Negative. Fact Sheet for Patients:  PinkCheek.be Fact Sheet for Healthcare Providers:  GravelBags.it This test is not yet ap proved or cleared by the [place] FDA and  has been authorized for detection and/or diagnosis of SARS-CoV-2 by FDA under an Emergency Use Authorization (EUA). This EUA will remain  in effect (meaning this test can be used) for the duration of the COVID-19 declaration under Section 564(b)(1) of the Act, 21 U.S.C. section 360bbb-3(b)(1), unless the authorization is terminated or revoked sooner.    Influenza A by PCR NEGATIVE NEGATIVE Final   Influenza B by PCR NEGATIVE NEGATIVE Final    Comment: (NOTE) The Xpert Xpress SARS-CoV-2/FLU/RSV assay is intended as an aid in  the diagnosis of influenza from Nasopharyngeal swab specimens and  should not be used as a sole basis for treatment. Nasal washings and  aspirates are unacceptable for Xpert Xpress SARS-CoV-2/FLU/RSV  testing. Fact Sheet for Patients: PinkCheek.be Fact Sheet for Healthcare Providers: GravelBags.it This test is not yet approved or cleared by the [place] FDA and  has been authorized for detection and/or  diagnosis of SARS-CoV-2 by  FDA under an Emergency Use Authorization (EUA). This EUA will remain  in effect (meaning this test can be used) for the duration of the  Covid-19 declaration under Section 564(b)(1) of the Act, 21  U.S.C. section 360bbb-3(b)(1), unless the authorization is  terminated or revoked. Performed at [facility], [address], [place], [place] [address]           Radiology Studies: DG Chest 1 View  Result Date: 10/09/2019 CLINICAL DATA:  Stroke-like symptoms EXAM: CHEST  1 VIEW COMPARISON:  12/23/2018 FINDINGS: Cardiac shadow is within normal limits. Lungs are well aerated without focal infiltrate or sizable. Left chest wall port is superior cava. No bony is seen. IMPRESSION: No acute abnormality noted. Electronically Signed   By: Inez Catalina M.D.   On: 10/09/2019 00:19   CT Head Wo Contrast  Result Date: 10/08/2019 CLINICAL DATA:  Syncope EXAM: CT HEAD WITHOUT CONTRAST TECHNIQUE: Contiguous axial images were obtained from the base of the skull through the vertex without intravenous contrast. COMPARISON:  04/03/2019 FINDINGS: Brain: There is no acute intracranial hemorrhage, mass-effect, or edema. There is no new loss of gray differentiation. Left parietal chronic infarct again noted. New age-indeterminate small vessel infarcts of the left caudate and left cerebellum. Additional patchy hypoattenuation in the supratentorial white matter is nonspecific but may reflect minor chronic microvascular ischemic changes. There is no extra-axial fluid collection. Ventricles and sulci are within normal limits in size and configuration. Vascular: There is atherosclerotic calcification at the skull base. Skull: Calvarium is unremarkable. Sinuses/Orbits: Mild ethmoid sinus mucosal thickening. Orbits are unremarkable. Other: Mastoid air cells are clear. IMPRESSION: No acute intracranial hemorrhage or mass effect. New age-indeterminate small vessel infarcts of the left caudate and left cerebellum. Chronic left parietal infarct. Electronically Signed   By: Macy Mis M.D.   On: 10/08/2019 20:42   MR BRAIN WO CONTRAST  Result Date: 10/08/2019 CLINICAL DATA:  Stroke follow-up. Syncope. EXAM: MRI HEAD WITHOUT CONTRAST TECHNIQUE: Multiplanar, multiecho pulse sequences of the brain and surrounding structures were obtained without intravenous contrast. COMPARISON:  Brain  MRI 05/19/2019 FINDINGS: Brain: There are punctate foci of diffusion restriction within the bilateral frontal white matter. There is an area of milder diffusion abnormality within the posterior right parietal lobe. Area of encephalomalacia along the left postcentral gyrus is unchanged. There is a new focus of hyperintense T2-weighted signal within the right temporal lobe (series 15 image 21). Small focus of hemosiderin deposition in the left cerebellum. Vascular: Unchanged abnormal left internal carotid artery flow void. Skull and upper cervical spine:  Normal bone marrow signal. Sinuses/Orbits: Small amount of left mastoid fluid. Normal orbits. Other: IMPRESSION: 1. Multifocal abnormal diffusion restriction within the bilateral frontal white matter and within the posterior right parietal lobe, likely indicating acute to subacute infarcts. 2. New focus of hyperintense T2-weighted signal within the right temporal lobe, which may be a late subacute infarct. However, a small metastatic lesion could also have this appearance. Further imaging with intravenous contrast might be helpful for further evaluation. 3. Unchanged abnormal left internal carotid artery flow void, consistent with occlusion. 4. Unchanged encephalomalacia along the left postcentral gyrus. Electronically Signed   By: Ulyses Jarred M.D.   On: 10/08/2019 23:16   US Carotid Bilateral (at [facility] and [facility] only)  Result Date: 10/09/2019 CLINICAL DATA:  Carotid atherosclerosis, known left ICA occlusion EXAM: BILATERAL CAROTID DUPLEX ULTRASOUND TECHNIQUE: Pearline Cables scale imaging, color Doppler and duplex ultrasound were performed of bilateral carotid  and vertebral arteries in the neck. COMPARISON:  04/03/2019 CTA neck FINDINGS: Criteria: Quantification of carotid stenosis is based on velocity parameters that correlate the residual internal carotid diameter with NASCET-based stenosis levels, using the diameter of the distal internal carotid lumen as the denominator  for stenosis measurement. The following velocity measurements were obtained: RIGHT ICA: 102/30 cm/sec CCA: [id number] cm/sec SYSTOLIC ICA/CCA RATIO:  0.7 ECA: 107 cm/sec LEFT ICA: Occluded CCA: [id number] cm/sec SYSTOLIC ICA/CCA RATIO:  Not applicable ECA: [id number] cm/sec RIGHT CAROTID ARTERY: Minor echogenic shadowing plaque formation. No hemodynamically significant right ICA stenosis, velocity elevation, or turbulent flow. Degree of narrowing less than 50%. RIGHT VERTEBRAL ARTERY:  Antegrade LEFT CAROTID ARTERY: Chronic occlusion of the left ICA. Left CCA and ECA remain patent without evidence of atherosclerotic change. LEFT VERTEBRAL ARTERY:  Antegrade IMPRESSION: Minor right ICA narrowing, less than 50% by ultrasound criteria. Known chronic occlusion of the left ICA Patent antegrade vertebral flow bilaterally Electronically Signed   By: Jerilynn Mages.  Shick M.D.   On: 10/09/2019 09:16        Scheduled Meds: .  stroke: mapping our early stages of recovery book   Does not apply Once  . aspirin  300 mg Rectal Daily   Or  . aspirin EC  325 mg Oral Daily  . atorvastatin  80 mg Oral q1800  . insulin aspart  0-9 Units Subcutaneous Q4H  . insulin glargine  32 Units Subcutaneous Q2200  . pantoprazole  20 mg Oral Daily  . sertraline  50 mg Oral Daily  . tamsulosin  0.4 mg Oral Daily  . thyroid  30 mg Oral QAC breakfast   Continuous Infusions: . sodium chloride 75 mL/hr at 10/09/19 0344     LOS: 0 days    Time spent: 37min  Domenic Polite, MD [facility]  10/09/2019, 3:14 PM

## 2019-10-10 ENCOUNTER — Inpatient Hospital Stay: Payer: Medicare Other

## 2019-10-10 LAB — BASIC METABOLIC PANEL WITH GFR
Anion gap: 7 (ref 5–15)
BUN: 16 mg/dL (ref 8–23)
CO2: 25 mmol/L (ref 22–32)
Calcium: 8.6 mg/dL — ABNORMAL LOW (ref 8.9–10.3)
Chloride: 107 mmol/L (ref 98–111)
Creatinine, Ser: 0.48 mg/dL — ABNORMAL LOW (ref 0.61–1.24)
GFR calc Af Amer: >60 mL/min
GFR calc non Af Amer: >60 mL/min
Glucose, Bld: 145 mg/dL — ABNORMAL HIGH (ref 70–99)
Potassium: 3.8 mmol/L (ref 3.5–5.1)
Sodium: 139 mmol/L (ref 135–145)

## 2019-10-10 LAB — GLUCOSE, CAPILLARY
Glucose-Capillary: 179 mg/dL — ABNORMAL HIGH (ref 70–99)
Glucose-Capillary: 127 mg/dL — ABNORMAL HIGH (ref 70–99)
Glucose-Capillary: 196 mg/dL — ABNORMAL HIGH (ref 70–99)

## 2019-10-10 LAB — CBC
HCT: 42.1 % (ref 39.0–52.0)
Hemoglobin: 14.4 g/dL (ref 13.0–17.0)
MCH: 31.2 pg (ref 26.0–34.0)
MCHC: 34.2 g/dL (ref 30.0–36.0)
MCV: 91.3 fL (ref 80.0–100.0)
Platelets: 209 10*3/uL (ref 150–400)
RBC: 4.61 MIL/uL (ref 4.22–5.81)
RDW: 14.7 % (ref 11.5–15.5)
WBC: 7.9 10*3/uL (ref 4.0–10.5)
nRBC: 0 % (ref 0.0–0.2)

## 2019-10-10 MED ORDER — ASPIRIN 81 MG PO TABS: 81.0000 mg | ORAL_TABLET | Freq: Every day | ORAL | Status: AC

## 2019-10-10 MED ORDER — CLOPIDOGREL BISULFATE 75 MG PO TABS
75.0000 mg | ORAL_TABLET | Freq: Every day | ORAL | Status: DC
  Administered 2019-10-10: 75 mg via ORAL
  Filled 2019-10-10: qty 1

## 2019-10-10 MED ORDER — GADOBUTROL 1 MMOL/ML IV SOLN
6.0000 mL | Freq: Once | INTRAVENOUS | Status: AC | PRN
  Administered 2019-10-10: 10:00:00 6 mL via INTRAVENOUS

## 2019-10-10 MED ORDER — CLOPIDOGREL BISULFATE 75 MG PO TABS: 75.0000 mg | ORAL_TABLET | Freq: Every day | ORAL | 1 refills | Status: DC

## 2019-10-10 MED ORDER — ASPIRIN 81 MG PO CHEW
81.0000 mg | CHEWABLE_TABLET | Freq: Every day | ORAL | Status: DC
  Filled 2019-10-10: qty 1

## 2019-10-10 MED ORDER — INSULIN GLARGINE 100 UNIT/ML ~~LOC~~ SOLN
42.0000 [IU] | Freq: Every day | SUBCUTANEOUS | Status: DC
  Filled 2019-10-10: qty 0.42

## 2019-10-10 NOTE — Discharge Summary (Signed)
Physician Discharge Summary  Mark Foster DOB: 1955-11-11 DOA: 10/08/2019  PCP: Doree Albee, MD  Admit date: 10/08/2019 Discharge date: 10/10/2019  Time spent: 35 minutes  Recommendations for Outpatient Follow-up:  1. [facility] cardiology in 7 to 10 days for TEE with loop versus Holter monitor for embolic stroke 2. [place] neurology in 1 month 3. PCP in 1 week 4. 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 Syncope Right temporal lobe suspected subacute CVA Pancreatic cancer Dyslipidemia   Essential hypertension, benign   Mixed hyperlipidemia   Diabetes ([place])   Diabetes mellitus type 2 in nonobese [facility])   Hypothyroidism   Pancreatic adenocarcinoma ([place])   Loss of consciousness ([place])   CVA (cerebral vascular accident) [facility])   Discharge Condition: Stable  Diet recommendation: Diabetic, heart healthy  Filed Weights   10/08/19 2001  Weight: 68 kg    History of present illness:  Mark Foster is a 64 year old male with history of type 2 diabetes mellitus, left carotid artery occlusion, history of pancreatic cancer underwent Whipple surgery in 2020 at [facility], treated with chemotherapy approximately 3 months ago, history of TIA versus CVA in 2011, hypertension, went to a bar yesterday for open [employer], apparently finished playing his guitar and then does not recall anything that happened thereafter he reportedly had a syncopal event and he remembers waking up when EMS was transporting him to the hospital, reports being groggy and lethargic when he regained consciousness.  No overt seizure activity was noted by bystanders -Work-up in the emergency room noted multiple small bilateral frontal white matter and posterior right parietal lobe infarcts, in addition there was a right temporal area with questionable lesion which could not rule out metastatic disease.  Hospital Course:  64 y.o.malewith medical  history significant of DM, vitamin D deficiency,pancreatic cancer (last chemo 4 weeks ago),hypothyroidism, diabetic neuropathy, stroke (2011 with stenosis of left vertebral artery and carotid occlusion) andHTN who presented after a syncopal episode. MRI of the brain shows multiple, small bilateral frontal white matter and posterior right parietal lobe acute infarcts. Etiology likely cardioembolic and may be related to hypercoagulability from his cancer. There was also a concerning area in the right temporal lobe that although may be infarct as well, can not rule out the possibility of a metastatic lesion.  MRI of the brain repeated with contrast, this was felt to be most consistent with infarct.  Carotid dopplers continue to show occluded left ICA. No hemodynamically significant RICA stenosis noted. Echocardiogram shows no cardiac source of emboli with EF of 60-65%.  LDL 123. A1c 12.1. Patient admits to medication noncompliance.  Patient on ASA and statin prior to admission. Unclear etiology of syncopal episode. EEG shows no epileptiform activity -Neurology recommended aspirin and Plavix for 3 weeks followed by Plavix alone -2D echocardiogram with preserved EF, no cardiac source of embolus -LDL is 123, he is on statin, Lipitor 80 mg, lifestyle modification advised -Hemoglobin A1c is 12, advised better control of his diabetes and smoking cessation -Neurology recommends follow-up with cardiology for consideration of TEE with loop versus Holter monitor, contacted [facility] cardiology, they will arrange follow-up in 7 to 10 days  Syncope -EEG negative for seizure, etiology unclear, no events on telemetry and echo unremarkable  Type 2 diabetes mellitus -Hemoglobin A1c was 12, patient admits poor compliance with his diabetes regimen, continue Lantus and sliding scale, diet/lifestyle modification  History of pancreatic cancer -Follow-up with [facility]  Hypothyroidism -Continue  Synthroid  Tobacco abuse -Counseled  Discharge Exam: Vitals:   10/10/19 0858 10/10/19 1338  BP: 121/72 (!) 122/54  Pulse: 82 86  Resp: 20 18  Temp:  97.7 F (36.5 C)  SpO2: 98% 99%    General: AAOx3 Cardiovascular: S1S2/RRR Respiratory: CTAB  Discharge Instructions   Discharge Instructions    Ambulatory referral to Neurology   Complete by: As directed    An appointment is requested in approximately: 4 weeks   Diet - low sodium heart healthy   Complete by: As directed    Diet Carb Modified   Complete by: As directed    Increase activity slowly   Complete by: As directed      Allergies as of 10/10/2019      Reactions   Demerol [meperidine Hcl] Other (See Comments)   convulsions      Medication List    TAKE these medications   aspirin 81 MG tablet Take 1 tablet (81 mg total) by mouth daily for 21 days.   atorvastatin 80 MG tablet Commonly known as: LIPITOR TAKE 1 TABLET(80 MG) BY MOUTH DAILY AT 6 PM   Basaglar KwikPen 100 UNIT/ML Sopn INJECT 42 UNITS INTO THE SKIN AT BEDTIME   clopidogrel 75 MG tablet Commonly known as: PLAVIX Take 1 tablet (75 mg total) by mouth daily. Start taking on: October 11, 2019   [facility] EXTRACT PO Take 2 tablets by mouth daily.   gabapentin 300 MG capsule Commonly known as: NEURONTIN Take 1 capsule (300 mg total) by mouth 2 (two) times daily.   Insulin Pen Needle 32G X 4 MM Misc 1 Device by Does not apply route daily.   loperamide 2 MG tablet Commonly known as: Imodium A-D Take 1 tablet (2 mg total) by mouth as needed. Take 2 at diarrhea onset , then 1 every 2hr until 12hrs with no BM. May take 2 every 4hrs at night. If diarrhea recurs repeat.   meclizine 25 MG tablet Commonly known as: ANTIVERT Take 1 tablet (25 mg total) by mouth 3 (three) times daily as needed for dizziness.   NP Thyroid 30 MG tablet Generic drug: thyroid Take 30 mg by mouth daily before breakfast.   Omega-3 1000 MG Caps Take 1,000 mg by  mouth daily.   ondansetron 8 MG tablet Commonly known as: ZOFRAN Take 1 tablet (8 mg total) by mouth every 6 (six) hours as needed for nausea or vomiting.   pantoprazole 20 MG tablet Commonly known as: PROTONIX Take 20 mg by mouth as needed.   sertraline 50 MG tablet Commonly known as: ZOLOFT TAKE 1 TABLET BY MOUTH DAILY   tamsulosin 0.4 MG Caps capsule Commonly known as: FLOMAX Take 0.4 mg by mouth daily.   traMADol 50 MG tablet Commonly known as: ULTRAM Take 1 tablet (50 mg total) by mouth every 6 (six) hours as needed.   vitamin B-12 1000 MCG tablet Commonly known as: CYANOCOBALAMIN Take 1,000 mcg by mouth daily.      Allergies  Allergen Reactions  . Demerol [Meperidine Hcl] Other (See Comments)    convulsions   Follow-up Information    Teodoro Spray, MD Follow up in 10 day(s).   Specialty: Cardiology Why: Follow up with Dr. Bartholome Bill or Doristine Mango within 10 days of discharge.  Contact information: [place] [address] [phone]        Doree Albee, MD. Schedule an appointment as soon as possible for a  visit in 1 week(s).   Specialty: Internal Medicine Contact information: [place] [address] [phone]        [facility]. Schedule an appointment as soon as possible for a visit in 1 month(s).   Contact information: [place] [phone]           The results of significant diagnostics from this hospitalization (including imaging, microbiology, ancillary and laboratory) are listed below for reference.    Significant Diagnostic Studies: EEG  Result Date: 10/09/2019 Alexis Goodell, MD     10/09/2019  4:14 PM ELECTROENCEPHALOGRAM REPORT Patient: Hannibal Brookens       Room #: [id number] EEG No. ID: [id number] Age: 64 y.o.        Sex: male Requesting Physician: Broadus John Report Date:  10/09/2019       Interpreting Physician: Alexis Goodell History: Ethyn Wenderoth is an 64 y.o. male with syncope Medications: ASA, Thyroid Armour, Lipitor, Zoloft, Flomax, Insulin Conditions of Recording:  This is a 21 channel routine scalp EEG performed with bipolar and monopolar montages arranged in accordance to the international 10/20 system of electrode placement. One channel was dedicated to EKG recording. The patient is in the awake, drowsy and asleep states. Description:  The waking background activity consists of a low voltage, symmetrical, fairly well organized, 9-10 Hz alpha activity, seen from the parieto-occipital and posterior temporal regions.  Low voltage fast activity, poorly organized, is seen anteriorly and is at times superimposed on more posterior regions.  A mixture of theta and alpha rhythms are seen from the central and temporal regions. The patient drowses with slowing to irregular, low voltage theta and beta activity.  The patient goes in to a light sleep with symmetrical sleep spindles, vertex central sharp transients and irregular slow activity. No epileptiform activity is noted.  Hyperventilation was not performed.  Intermittent photic stimulation was performed but failed to illicit any change in the tracing.  IMPRESSION: Normal electroencephalogram, awake, asleep and with activation procedures. There are no focal lateralizing or epileptiform features. Alexis Goodell, MD Neurology [phone] 10/09/2019, 4:11 PM   DG Chest 1 View  Result Date: 10/09/2019 CLINICAL DATA:  Stroke-like symptoms EXAM: CHEST  1 VIEW COMPARISON:  12/23/2018 FINDINGS: Cardiac shadow is within normal limits. Lungs are well aerated without focal infiltrate or sizable. Left chest wall port is superior cava. No bony is seen. IMPRESSION: No acute abnormality noted. Electronically Signed   By: Inez Catalina M.D.   On: 10/09/2019 00:19   CT Head Wo Contrast  Result Date: 10/08/2019 CLINICAL DATA:  Syncope EXAM: CT HEAD WITHOUT CONTRAST TECHNIQUE: Contiguous axial  images were obtained from the base of the skull through the vertex without intravenous contrast. COMPARISON:  04/03/2019 FINDINGS: Brain: There is no acute intracranial hemorrhage, mass-effect, or edema. There is no new loss of gray differentiation. Left parietal chronic infarct again noted. New age-indeterminate small vessel infarcts of the left caudate and left cerebellum. Additional patchy hypoattenuation in the supratentorial white matter is nonspecific but may reflect minor chronic microvascular ischemic changes. There is no extra-axial fluid collection. Ventricles and sulci are within normal limits in size and configuration. Vascular: There is atherosclerotic calcification at the skull base. Skull: Calvarium is unremarkable. Sinuses/Orbits: Mild ethmoid sinus mucosal thickening. Orbits are unremarkable. Other: Mastoid air cells are clear. IMPRESSION: No acute intracranial hemorrhage or mass effect. New age-indeterminate small vessel infarcts of the left caudate and left cerebellum. Chronic left parietal  infarct. Electronically Signed   By: Macy Mis M.D.   On: 10/08/2019 20:42   MR BRAIN WO CONTRAST  Result Date: 10/08/2019 CLINICAL DATA:  Stroke follow-up. Syncope. EXAM: MRI HEAD WITHOUT CONTRAST TECHNIQUE: Multiplanar, multiecho pulse sequences of the brain and surrounding structures were obtained without intravenous contrast. COMPARISON:  Brain MRI 05/19/2019 FINDINGS: Brain: There are punctate foci of diffusion restriction within the bilateral frontal white matter. There is an area of milder diffusion abnormality within the posterior right parietal lobe. Area of encephalomalacia along the left postcentral gyrus is unchanged. There is a new focus of hyperintense T2-weighted signal within the right temporal lobe (series 15 image 21). Small focus of hemosiderin deposition in the left cerebellum. Vascular: Unchanged abnormal left internal carotid artery flow void. Skull and upper cervical spine:   Normal bone marrow signal. Sinuses/Orbits: Small amount of left mastoid fluid. Normal orbits. Other: IMPRESSION: 1. Multifocal abnormal diffusion restriction within the bilateral frontal white matter and within the posterior right parietal lobe, likely indicating acute to subacute infarcts. 2. New focus of hyperintense T2-weighted signal within the right temporal lobe, which may be a late subacute infarct. However, a small metastatic lesion could also have this appearance. Further imaging with intravenous contrast might be helpful for further evaluation. 3. Unchanged abnormal left internal carotid artery flow void, consistent with occlusion. 4. Unchanged encephalomalacia along the left postcentral gyrus. Electronically Signed   By: Ulyses Jarred M.D.   On: 10/08/2019 23:16   MR BRAIN W CONTRAST  Result Date: 10/10/2019 CLINICAL DATA:  64 year old male with syncope, multifocal abnormal signal abnormality on recent brain MRI most compatible with ischemia, but history of pancreatic cancer. Postcontrast images now to exclude metastatic disease. EXAM: MRI HEAD WITH CONTRAST TECHNIQUE: Multiplanar, multiecho pulse sequences of the brain and surrounding structures were obtained with intravenous contrast. CONTRAST:  56mL GADAVIST GADOBUTROL 1 MMOL/ML IV SOLN COMPARISON:  Head CT 10/08/2019. Brain MRI without contrast 10/08/2019. FINDINGS: Axial DWI imaging was repeated today. Multifocal scattered bilateral gyral and subcortical restricted diffusion in both superior frontal lobes, right parietal lobe, posterior right temporal and a right occipital lobes is in a similar pattern but mildly progressed since 10/08/2019 and most resembles acute infarcts related to embolic or perhaps hypotensive etiology. The deep gray nuclei, brainstem and cerebellum are spared as before. However, there is subacute to chronic left cerebellar post ischemic appearing encephalomalacia. Superimposed chronic posterior left MCA territory infarct with  encephalomalacia. The area of abnormal FLAIR signal described on 10/08/2019 seems to correspond to a right temporal lobe sulcus seen on series 7 image 80 today, and there does appear to be faint abnormal although somewhat curvilinear enhancement there (series 10, image 6). No associated diffusion restriction. But postcontrast images also show scattered infarct related enhancement corresponding to areas of restricted diffusion, and also the left cerebellar encephalomalacia. No masslike areas of enhancement at this time. No dural thickening. No intracranial mass effect. No ventriculomegaly. Negative pituitary, cervicomedullary junction and visible spinal cord. IMPRESSION: Numerous small areas of abnormal brain enhancement - including faint enhancement at the indeterminate right temporal lobe area described yesterday - although all of these more resemble post ischemic and infarct related enhancement than cerebral metastatic disease. And there are definite underlying acute and chronic infarcts scattered throughout the brain. However, a repeat brain MRI without and with contrast is recommended (e.g. 6 to 12 weeks from now) to document expected post ischemic evolution of changes and exclude developing metastatic disease. Electronically Signed   By: Lemmie Evens  [place] Crane M.D.   On: 10/10/2019 10:00   CT Abdomen Pelvis W Contrast  Result Date: 09/19/2019 CLINICAL DATA:  Restaging pancreatic adenocarcinoma. EXAM: CT ABDOMEN AND PELVIS WITH CONTRAST TECHNIQUE: Multidetector CT imaging of the abdomen and pelvis was performed using the standard protocol following bolus administration of intravenous contrast. CONTRAST:  176mL OMNIPAQUE IOHEXOL 300 MG/ML  SOLN COMPARISON:  06/18/2019 FINDINGS: Lower chest: No acute abnormality. Hepatobiliary: No suspicious liver abnormality. 6 mm gallstone unchanged. No gallbladder inflammation or bile duct dilatation. Pancreas: The previously characterized fluid collection along the resection margin of  the pancreas measures 1.1 x 1.1 cm, image 31/7. Stable the small low-density structure within the uncinate process is also unchanged measuring 8 mm, image 43/7. No new abnormality. Spleen: Status post splenectomy. Adrenals/Urinary Tract: Normal appearance of the adrenal glands. Kidneys are unremarkable. 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 Stomach/Bowel: Stomach is within normal limits. No evidence of bowel wall thickening, distention, or inflammatory changes. Vascular/Lymphatic: Aortic atherosclerosis. No aneurysm. No abdominopelvic adenopathy Reproductive: Prostate gland appears enlarged Other: Collateral vessels along the omentum and stomach. No free fluid or fluid collections. Musculoskeletal: No acute or significant osseous findings. Schmorl's node is identified within the superior endplate of L2. IMPRESSION: 1. Stable exam compared with 06/18/2019. No suspicious mass or adenopathy identified. 2. Unchanged appearance of small fluid collection along the resection margin of the pancreas. 3. Stable small low-density structure within the uncinate process. 4. Aortic atherosclerosis. Aortic Atherosclerosis (ICD10-I70.0). Electronically Signed   By: Kerby Moors M.D.   On: 09/19/2019 13:59   US Carotid Bilateral (at [facility] and [facility] only)  Result Date: 10/09/2019 CLINICAL DATA:  Carotid atherosclerosis, known left ICA occlusion EXAM: BILATERAL CAROTID DUPLEX ULTRASOUND TECHNIQUE: Pearline Cables scale imaging, color Doppler and duplex ultrasound were performed of bilateral carotid and vertebral arteries in the neck. COMPARISON:  04/03/2019 CTA neck FINDINGS: Criteria: Quantification of carotid stenosis is based on velocity parameters that correlate the residual internal carotid diameter with NASCET-based stenosis levels, using the diameter of the distal internal carotid lumen as the denominator for stenosis measurement. The following velocity measurements were obtained: RIGHT ICA: 102/30 cm/sec CCA: [id number] cm/sec SYSTOLIC  ICA/CCA RATIO:  0.7 ECA: 107 cm/sec LEFT ICA: Occluded CCA: [id number] cm/sec SYSTOLIC ICA/CCA RATIO:  Not applicable ECA: [id number] cm/sec RIGHT CAROTID ARTERY: Minor echogenic shadowing plaque formation. No hemodynamically significant right ICA stenosis, velocity elevation, or turbulent flow. Degree of narrowing less than 50%. RIGHT VERTEBRAL ARTERY:  Antegrade LEFT CAROTID ARTERY: Chronic occlusion of the left ICA. Left CCA and ECA remain patent without evidence of atherosclerotic change. LEFT VERTEBRAL ARTERY:  Antegrade IMPRESSION: Minor right ICA narrowing, less than 50% by ultrasound criteria. Known chronic occlusion of the left ICA Patent antegrade vertebral flow bilaterally Electronically Signed   By: Jerilynn Mages.  Shick M.D.   On: 10/09/2019 09:16   ECHOCARDIOGRAM COMPLETE BUBBLE STUDY  Result Date: 10/09/2019   ECHOCARDIOGRAM REPORT   Patient Name:   JONN MILIA Date of Exam: 10/09/2019 Medical Rec #:  [id number]    Height:       70.0 in Accession #:    [id number]   Weight:       150.0 lb Date of Birth:  10/16/1955     BSA:          1.85 m Patient Age:    4 years     BP:           125/85 mmHg Patient Gender: M  HR:           86 bpm. Exam Location:  [facility] Procedure: 2D Echo, Cardiac Doppler, Color Doppler and Saline Contrast Bubble            Study Indications:     Stroke 434.91  History:         Patient has prior history of Echocardiogram examinations, most                  recent 11/12/2018. Stroke; Risk Factors:Diabetes.  Sonographer:     Sherrie Sport RDCS (AE) Referring Phys:  [place] Diagnosing Phys: Ida Rogue MD IMPRESSIONS  1. Left ventricular ejection fraction, by visual estimation, is 60 to 65%. The left ventricle has normal function. There is no left ventricular hypertrophy.  2. The left ventricle has no regional wall motion abnormalities.  3. Left ventricular diastolic parameters are consistent with Grade I diastolic dysfunction (impaired relaxation).  4. Global right ventricle has normal  systolic function.The right ventricular size is normal. No increase in right ventricular wall thickness.  5. Left atrial size was normal.  6. Normal pulmonary artery systolic pressure.  7. Negative saline contrast study. FINDINGS  Left Ventricle: Left ventricular ejection fraction, by visual estimation, is 60 to 65%. The left ventricle has normal function. The left ventricle has no regional wall motion abnormalities. There is no left ventricular hypertrophy. Left ventricular diastolic parameters are consistent with Grade I diastolic dysfunction (impaired relaxation). Normal left atrial pressure. Right Ventricle: The right ventricular size is normal. No increase in right ventricular wall thickness. Global RV systolic function is has normal systolic function. The tricuspid regurgitant velocity is 1.86 m/s, and with an assumed right atrial pressure  of 10 mmHg, the estimated right ventricular systolic pressure is normal at 23.8 mmHg. Left Atrium: Left atrial size was normal in size. Right Atrium: Right atrial size was normal in size Pericardium: There is no evidence of pericardial effusion. Mitral Valve: The mitral valve is normal in structure. No evidence of mitral valve regurgitation. No evidence of mitral valve stenosis by observation. Tricuspid Valve: The tricuspid valve is normal in structure. Tricuspid valve regurgitation is not demonstrated. Aortic Valve: The aortic valve was not well visualized. Aortic valve regurgitation is not visualized. The aortic valve is structurally normal, with no evidence of sclerosis or stenosis. Aortic valve mean gradient measures 1.5 mmHg. Aortic valve peak gradient measures 2.4 mmHg. Aortic valve area, by VTI measures 4.40 cm. Pulmonic Valve: The pulmonic valve was normal in structure. Pulmonic valve regurgitation is not visualized. Pulmonic regurgitation is not visualized. Aorta: The aortic root, ascending aorta and aortic arch are all structurally normal, with no evidence of  dilitation or obstruction. Venous: The inferior vena cava is normal in size with greater than 50% respiratory variability, suggesting right atrial pressure of 3 mmHg. IAS/Shunts: No atrial level shunt detected by color flow Doppler. Agitated saline contrast was given intravenously to evaluate for intracardiac shunting. There is no evidence of a patent foramen ovale. No ventricular septal defect is seen or detected. There is no evidence of an atrial septal defect.  LEFT VENTRICLE PLAX 2D LVIDd:         4.59 cm  Diastology LVIDs:         2.61 cm  LV e' lateral:   12.60 cm/s LV PW:         1.03 cm  LV E/e' lateral: 5.6 LV IVS:        1.03 cm  LV e'  medial:    7.62 cm/s LVOT diam:     2.10 cm  LV E/e' medial:  9.3 LV SV:         72 ml LV SV Index:   39.34 LVOT Area:     3.46 cm  RIGHT VENTRICLE RV Basal diam:  3.34 cm RV S prime:     11.50 cm/s TAPSE (M-mode): 3.5 cm LEFT ATRIUM             Index       RIGHT ATRIUM           Index LA diam:        3.70 cm 2.00 cm/m  RA Area:     14.30 cm LA Vol (A2C):   35.4 ml 19.17 ml/m RA Volume:   36.40 ml  19.71 ml/m LA Vol (A4C):   25.7 ml 13.91 ml/m LA Biplane Vol: 30.8 ml 16.67 ml/m  AORTIC VALVE                   PULMONIC VALVE AV Area (Vmax):    3.93 cm    PV Vmax:        0.72 m/s AV Area (Vmean):   3.91 cm    PV Peak grad:   2.1 mmHg AV Area (VTI):     4.40 cm    RVOT Peak grad: 2 mmHg AV Vmax:           78.15 cm/s AV Vmean:          52.000 cm/s AV VTI:            0.167 m AV Peak Grad:      2.4 mmHg AV Mean Grad:      1.5 mmHg LVOT Vmax:         88.70 cm/s LVOT Vmean:        58.700 cm/s LVOT VTI:          0.213 m LVOT/AV VTI ratio: 1.27  AORTA Ao Root diam: 3.50 cm MITRAL VALVE                        TRICUSPID VALVE MV Area (PHT): 3.42 cm             TR Peak grad:   13.8 mmHg MV PHT:        64.38 msec           TR Vmax:        186.00 cm/s MV Decel Time: 222 msec MV E velocity: 70.80 cm/s 103 cm/s  SHUNTS MV A velocity: 93.10 cm/s 70.3 cm/s Systemic VTI:  0.21 m MV  E/A ratio:  0.76       1.5       Systemic Diam: 2.10 cm  Ida Rogue MD Electronically signed by Ida Rogue MD Signature Date/Time: 10/09/2019/7:37:12 PM    Final     Microbiology: Recent Results (from the past 240 hour(s))  Respiratory Panel by RT PCR (Flu A&B, Covid) - Nasopharyngeal Swab     Status: None   Collection Time: 10/08/19 11:12 PM   Specimen: Nasopharyngeal Swab  Result Value Ref Range Status   SARS Coronavirus 2 by RT PCR NEGATIVE NEGATIVE Final    Comment: (NOTE) SARS-CoV-2 target nucleic acids are NOT DETECTED. The SARS-CoV-2 RNA is generally detectable in upper respiratoy specimens during the acute phase of infection. The lowest concentration of SARS-CoV-2 viral copies this assay can detect is 131 copies/mL. A negative result  does not preclude SARS-Cov-2 infection and should not be used as the sole basis for treatment or other patient management decisions. A negative result may occur with  improper specimen collection/handling, submission of specimen other than nasopharyngeal swab, presence of viral mutation(s) within the areas targeted by this assay, and inadequate number of viral copies (<131 copies/mL). A negative result must be combined with clinical observations, patient history, and epidemiological information. The expected result is Negative. Fact Sheet for Patients:  PinkCheek.be Fact Sheet for Healthcare Providers:  GravelBags.it This test is not yet ap proved or cleared by the [place] FDA and  has been authorized for detection and/or diagnosis of SARS-CoV-2 by FDA under an Emergency Use Authorization (EUA). This EUA will remain  in effect (meaning this test can be used) for the duration of the COVID-19 declaration under Section 564(b)(1) of the Act, 21 U.S.C. section 360bbb-3(b)(1), unless the authorization is terminated or revoked sooner.    Influenza A by PCR NEGATIVE NEGATIVE Final    Influenza B by PCR NEGATIVE NEGATIVE Final    Comment: (NOTE) The Xpert Xpress SARS-CoV-2/FLU/RSV assay is intended as an aid in  the diagnosis of influenza from Nasopharyngeal swab specimens and  should not be used as a sole basis for treatment. Nasal washings and  aspirates are unacceptable for Xpert Xpress SARS-CoV-2/FLU/RSV  testing. Fact Sheet for Patients: PinkCheek.be Fact Sheet for Healthcare Providers: GravelBags.it This test is not yet approved or cleared by the [place] FDA and  has been authorized for detection and/or diagnosis of SARS-CoV-2 by  FDA under an Emergency Use Authorization (EUA). This EUA will remain  in effect (meaning this test can be used) for the duration of the  Covid-19 declaration under Section 564(b)(1) of the Act, 21  U.S.C. section 360bbb-3(b)(1), unless the authorization is  terminated or revoked. Performed at [facility], [address] [place], [place] [address]      Labs: Basic Metabolic Panel: Recent Labs  Lab 10/08/19 2009 10/10/19 0700  NA 137 139  K 3.5 3.8  CL 102 107  CO2 26 25  GLUCOSE 251* 145*  BUN 21 16  CREATININE 0.70 0.48*  CALCIUM 8.7* 8.6*   Liver Function Tests: Recent Labs  Lab 10/08/19 2009  AST 18  ALT 14  ALKPHOS 56  BILITOT 0.8  PROT 6.4*  ALBUMIN 3.7   No results for input(s): LIPASE, AMYLASE in the last 168 hours. No results for input(s): AMMONIA in the last 168 hours. CBC: Recent Labs  Lab 10/08/19 2009 10/10/19 0700  WBC 9.2 7.9  HGB 14.9 14.4  HCT 42.9 42.1  MCV 91.1 91.3  PLT 215 209   Cardiac Enzymes: No results for input(s): CKTOTAL, CKMB, CKMBINDEX, TROPONINI in the last 168 hours. BNP: BNP (last 3 results) No results for input(s): BNP in the last 8760 hours.  ProBNP (last 3 results) No results for input(s): PROBNP in the last 8760 hours.  CBG: Recent Labs  Lab 10/09/19 2021 10/09/19 2325  10/10/19 0404 10/10/19 0816 10/10/19 1121  GLUCAP 274* 232* 179* 127* 196*       Signed:  Domenic Polite MD.  [facility] 10/10/2019, 3:27 PM

## 2019-10-10 NOTE — Progress Notes (Addendum)
Inpatient Diabetes Program Recommendations  AACE/ADA: New Consensus Statement on Inpatient Glycemic Control (2015)  Target Ranges:  Prepandial:   less than 140 mg/dL      Peak postprandial:   less than 180 mg/dL (1-2 hours)      Critically ill patients:  140 - 180 mg/dL   Results for Mark Foster, Mark Foster (MRN [id number]) as of 10/10/2019 10:32  Ref. Range 10/09/2019 03:32 10/09/2019 08:42 10/09/2019 12:04 10/09/2019 17:01 10/09/2019 20:21  Glucose-Capillary Latest Ref Range: 70 - 99 mg/dL 313 (H)  7 units NOVOLOG  173 (H)  2 units NOVOLOG  376 (H)  9 units NOVOLOG  297 (H)  5 units NOVOLOG  274 (H)  5 units NOVOLOG    Results for Mark Foster, Mark Foster (MRN [id number]) as of 10/10/2019 10:32  Ref. Range 10/09/2019 23:25 10/10/2019 04:04 10/10/2019 08:16  Glucose-Capillary Latest Ref Range: 70 - 99 mg/dL 232 (H)  3 units NOVOLOG +  32 units LANTUS  179 (H)  2 units NOVOLOG  127 (H)  1 unit NOVOLOG    Results for Mark Foster, Mark Foster (MRN [id number]) as of 10/10/2019 10:32  Ref. Range 11/12/2018 01:09 02/25/2019 08:05 09/30/2019 11:13 10/08/2019 20:09  Hemoglobin A1C Latest Ref Range: 4.8 - 5.6 % 9.0 (H) 10.8 (H) 12.6 (H) 12.1 (H)  (300 mg/dl)   Admit with: Embolic CVA  History: DM, Pancreatic Cancer (distal pancreatectomy 11/2018)  Home DM Meds: Basaglar 42 units QHS  Current Orders: Lantus 42 units QHS      Novolog Sensitive Correction Scale/ SSI (0-9 units) Q4 hours   PCP: Dr. Peyton Najjar seen 09/30/2019  Endocrinologist: Dr. Ian Malkin seen 01/22/2019     MD- Note Lantus 32 units started at [facility] last night.  CBGs came down quickly after Lantus started.  Note dose increased to 42 units for tonight.  May not need so much Lantus since AM CBG down to 127 this AM with only 32 units Lantus.  Please consider:  1. Reduce Lantus back to 32 units QHS  2. Change Novolog SSi to TID AC + HS (currently ordered Q4 hours)  3. Start Novolog Meal Coverage: Novolog 3 units TID with meals  (Please add the  following Hold Parameters: Hold if pt eats <50% of meal, Hold if pt NPO)  4. Please consider giving pt a Novolog SSi to follow at home TID prior to meals until he can follow up with his Endocrinologist Dr. Dorris Fetch     Addendum 12:15pm--Met w/ pt today to discuss current A1c of 12.1%.  Pt stated he knew it was high and knows he needs to follow up with his ENDO Dr. Dorris Fetch.  Verified home insulin of Basaglar 42 units QHS.   No other DM meds for home.  Pt has CBG meter at home.  Encouraged pt to check his CBGs at least TID ac meals and to record all results for Dr. Dorris Fetch.  Discussed w/ pt that he may need more insulin at home with meals to help better manage his diabetes.  Explained what Novolog insulin is, how it works, how/when to take, etc.  Pt seemed interested in starting a Novolog SSI for home until he can follow up w/ his ENDO.  Told pt I would ask the Attending MD to consider giving him a Novolog SSI to follow until his next ENDO appt.  Had no other questions for me at this time.    --Will follow patient during hospitalization--  Wyn Quaker RN, MSN, CDE Diabetes Coordinator Inpatient Glycemic Control Team Team Pager:  [phone] (8a-5p)

## 2019-10-10 NOTE — Progress Notes (Signed)
Subjective: Patient is awake and alert.  No new neurological symptoms noted.    Objective: Current vital signs: BP (!) 122/54 (BP Location: Right Arm)   Pulse 86   Temp 97.7 F (36.5 C) (Oral)   Resp 18   Ht 5\' 10"  (1.778 m)   Wt 68 kg   SpO2 99%   BMI 21.52 kg/m  Vital signs in last 24 hours: Temp:  [97.6 F (36.4 C)-98.5 F (36.9 C)] 97.7 F (36.5 C) (02/05 1338) Pulse Rate:  [72-86] 86 (02/05 1338) Resp:  [13-20] 18 (02/05 1338) BP: (104-133)/(54-79) 122/54 (02/05 1338) SpO2:  [95 %-99 %] 99 % (02/05 1338)  Intake/Output from previous day: 02/04 0701 - 02/05 0700 In: 1161.3 [P.O.:240; I.V.:921.3] Out: 1200 [Urine:1200] Intake/Output this shift: Total I/O In: 740 [I.V.:740] Out: -  Nutritional status:  Diet Order            Diet Carb Modified Fluid consistency: Thin; Room service appropriate? Yes  Diet effective now              Neurologic Exam: Mental Status: Alert, oriented, thought content appropriate.  Speech fluent without evidence of aphasia.  Able to follow 3 step commands without difficulty. Cranial Nerves: II: Discs flat bilaterally; Visual fields grossly normal, pupils equal, round, reactive to light and accommodation III,IV, VI: ptosis not present, extra-ocular motions intact bilaterally V,VII: smile symmetric, facial light touch sensation normal bilaterally VIII: hearing normal bilaterally IX,X: gag reflex present XI: bilateral shoulder shrug XII: midline tongue extension Motor: Right : Upper extremity   5/5    Left:     Upper extremity   5/5  Lower extremity   5/5     Lower extremity   5/5 Tone and bulk:normal tone throughout; no atrophy noted Sensory: Pinprick and light touch intact throughout, bilaterally  Lab Results: Basic Metabolic Panel: Recent Labs  Lab 10/08/19 2009 10/10/19 0700  NA 137 139  K 3.5 3.8  CL 102 107  CO2 26 25  GLUCOSE 251* 145*  BUN 21 16  CREATININE 0.70 0.48*  CALCIUM 8.7* 8.6*    Liver Function  Tests: Recent Labs  Lab 10/08/19 2009  AST 18  ALT 14  ALKPHOS 56  BILITOT 0.8  PROT 6.4*  ALBUMIN 3.7   No results for input(s): LIPASE, AMYLASE in the last 168 hours. No results for input(s): AMMONIA in the last 168 hours.  CBC: Recent Labs  Lab 10/08/19 2009 10/10/19 0700  WBC 9.2 7.9  HGB 14.9 14.4  HCT 42.9 42.1  MCV 91.1 91.3  PLT 215 209    Cardiac Enzymes: No results for input(s): CKTOTAL, CKMB, CKMBINDEX, TROPONINI in the last 168 hours.  Lipid Panel: Recent Labs  Lab 10/09/19 0159  CHOL 186  TRIG 105  HDL 42  CHOLHDL 4.4  VLDL 21  LDLCALC 123*    CBG: Recent Labs  Lab 10/09/19 2021 10/09/19 2325 10/10/19 0404 10/10/19 0816 10/10/19 1121  GLUCAP 274* 232* 179* 127* 196*    Microbiology: Results for orders placed or performed during the hospital encounter of 10/08/19  Respiratory Panel by RT PCR (Flu A&B, Covid) - Nasopharyngeal Swab     Status: None   Collection Time: 10/08/19 11:12 PM   Specimen: Nasopharyngeal Swab  Result Value Ref Range Status   SARS Coronavirus 2 by RT PCR NEGATIVE NEGATIVE Final    Comment: (NOTE) SARS-CoV-2 target nucleic acids are NOT DETECTED. The SARS-CoV-2 RNA is generally detectable in upper respiratoy specimens during the acute  phase of infection. The lowest concentration of SARS-CoV-2 viral copies this assay can detect is 131 copies/mL. A negative result does not preclude SARS-Cov-2 infection and should not be used as the sole basis for treatment or other patient management decisions. A negative result may occur with  improper specimen collection/handling, submission of specimen other than nasopharyngeal swab, presence of viral mutation(s) within the areas targeted by this assay, and inadequate number of viral copies (<131 copies/mL). A negative result must be combined with clinical observations, patient history, and epidemiological information. The expected result is Negative. Fact Sheet for Patients:   PinkCheek.be Fact Sheet for Healthcare Providers:  GravelBags.it This test is not yet ap proved or cleared by the [place] FDA and  has been authorized for detection and/or diagnosis of SARS-CoV-2 by FDA under an Emergency Use Authorization (EUA). This EUA will remain  in effect (meaning this test can be used) for the duration of the COVID-19 declaration under Section 564(b)(1) of the Act, 21 U.S.C. section 360bbb-3(b)(1), unless the authorization is terminated or revoked sooner.    Influenza A by PCR NEGATIVE NEGATIVE Final   Influenza B by PCR NEGATIVE NEGATIVE Final    Comment: (NOTE) The Xpert Xpress SARS-CoV-2/FLU/RSV assay is intended as an aid in  the diagnosis of influenza from Nasopharyngeal swab specimens and  should not be used as a sole basis for treatment. Nasal washings and  aspirates are unacceptable for Xpert Xpress SARS-CoV-2/FLU/RSV  testing. Fact Sheet for Patients: PinkCheek.be Fact Sheet for Healthcare Providers: GravelBags.it This test is not yet approved or cleared by the [place] FDA and  has been authorized for detection and/or diagnosis of SARS-CoV-2 by  FDA under an Emergency Use Authorization (EUA). This EUA will remain  in effect (meaning this test can be used) for the duration of the  Covid-19 declaration under Section 564(b)(1) of the Act, 21  U.S.C. section 360bbb-3(b)(1), unless the authorization is  terminated or revoked. Performed at [facility], [address] [place], [place] [address]     Coagulation Studies: No results for input(s): LABPROT, INR in the last 72 hours.  Imaging: EEG  Result Date: 10/09/2019 Mark Goodell, MD     10/09/2019  4:14 PM ELECTROENCEPHALOGRAM REPORT Patient: Mark Foster       Room #: [id number] EEG No. ID: [id number] Age: 64 y.o.        Sex: male Requesting Physician: Mark Foster Report  Date:  10/09/2019       Interpreting Physician: Mark Foster History: Mark Foster is an 64 y.o. male with syncope Medications: ASA, Thyroid Armour, Lipitor, Zoloft, Flomax, Insulin Conditions of Recording:  This is a 21 channel routine scalp EEG performed with bipolar and monopolar montages arranged in accordance to the international 10/20 system of electrode placement. One channel was dedicated to EKG recording. The patient is in the awake, drowsy and asleep states. Description:  The waking background activity consists of a low voltage, symmetrical, fairly well organized, 9-10 Hz alpha activity, seen from the parieto-occipital and posterior temporal regions.  Low voltage fast activity, poorly organized, is seen anteriorly and is at times superimposed on more posterior regions.  A mixture of theta and alpha rhythms are seen from the central and temporal regions. The patient drowses with slowing to irregular, low voltage theta and beta activity.  The patient goes in to a light sleep with symmetrical sleep spindles, vertex central sharp transients and irregular slow activity. No epileptiform activity is noted.  Hyperventilation was not performed.  Intermittent photic stimulation was performed but failed to illicit any change in the tracing.  IMPRESSION: Normal electroencephalogram, awake, asleep and with activation procedures. There are no focal lateralizing or epileptiform features. Mark Goodell, MD Neurology [phone] 10/09/2019, 4:11 PM   DG Chest 1 View  Result Date: 10/09/2019 CLINICAL DATA:  Stroke-like symptoms EXAM: CHEST  1 VIEW COMPARISON:  12/23/2018 FINDINGS: Cardiac shadow is within normal limits. Lungs are well aerated without focal infiltrate or sizable. Left chest wall port is superior cava. No bony is seen. IMPRESSION: No acute abnormality noted. Electronically Signed   By: Inez Catalina M.D.   On: 10/09/2019 00:19   CT Head Wo Contrast  Result Date: 10/08/2019 CLINICAL DATA:  Syncope  EXAM: CT HEAD WITHOUT CONTRAST TECHNIQUE: Contiguous axial images were obtained from the base of the skull through the vertex without intravenous contrast. COMPARISON:  04/03/2019 FINDINGS: Brain: There is no acute intracranial hemorrhage, mass-effect, or edema. There is no new loss of gray differentiation. Left parietal chronic infarct again noted. New age-indeterminate small vessel infarcts of the left caudate and left cerebellum. Additional patchy hypoattenuation in the supratentorial white matter is nonspecific but may reflect minor chronic microvascular ischemic changes. There is no extra-axial fluid collection. Ventricles and sulci are within normal limits in size and configuration. Vascular: There is atherosclerotic calcification at the skull base. Skull: Calvarium is unremarkable. Sinuses/Orbits: Mild ethmoid sinus mucosal thickening. Orbits are unremarkable. Other: Mastoid air cells are clear. IMPRESSION: No acute intracranial hemorrhage or mass effect. New age-indeterminate small vessel infarcts of the left caudate and left cerebellum. Chronic left parietal infarct. Electronically Signed   By: Macy Mis M.D.   On: 10/08/2019 20:42   MR BRAIN WO CONTRAST  Result Date: 10/08/2019 CLINICAL DATA:  Stroke follow-up. Syncope. EXAM: MRI HEAD WITHOUT CONTRAST TECHNIQUE: Multiplanar, multiecho pulse sequences of the brain and surrounding structures were obtained without intravenous contrast. COMPARISON:  Brain MRI 05/19/2019 FINDINGS: Brain: There are punctate foci of diffusion restriction within the bilateral frontal white matter. There is an area of milder diffusion abnormality within the posterior right parietal lobe. Area of encephalomalacia along the left postcentral gyrus is unchanged. There is a new focus of hyperintense T2-weighted signal within the right temporal lobe (series 15 image 21). Small focus of hemosiderin deposition in the left cerebellum. Vascular: Unchanged abnormal left internal  carotid artery flow void. Skull and upper cervical spine:  Normal bone marrow signal. Sinuses/Orbits: Small amount of left mastoid fluid. Normal orbits. Other: IMPRESSION: 1. Multifocal abnormal diffusion restriction within the bilateral frontal white matter and within the posterior right parietal lobe, likely indicating acute to subacute infarcts. 2. New focus of hyperintense T2-weighted signal within the right temporal lobe, which may be a late subacute infarct. However, a small metastatic lesion could also have this appearance. Further imaging with intravenous contrast might be helpful for further evaluation. 3. Unchanged abnormal left internal carotid artery flow void, consistent with occlusion. 4. Unchanged encephalomalacia along the left postcentral gyrus. Electronically Signed   By: Ulyses Jarred M.D.   On: 10/08/2019 23:16   MR BRAIN W CONTRAST  Result Date: 10/10/2019 CLINICAL DATA:  64 year old male with syncope, multifocal abnormal signal abnormality on recent brain MRI most compatible with ischemia, but history of pancreatic cancer. Postcontrast images now to exclude metastatic disease. EXAM: MRI HEAD WITH CONTRAST TECHNIQUE: Multiplanar, multiecho pulse sequences of the brain and surrounding structures were obtained with intravenous contrast. CONTRAST:  56mL GADAVIST GADOBUTROL 1 MMOL/ML IV SOLN COMPARISON:  Head CT  10/08/2019. Brain MRI without contrast 10/08/2019. FINDINGS: Axial DWI imaging was repeated today. Multifocal scattered bilateral gyral and subcortical restricted diffusion in both superior frontal lobes, right parietal lobe, posterior right temporal and a right occipital lobes is in a similar pattern but mildly progressed since 10/08/2019 and most resembles acute infarcts related to embolic or perhaps hypotensive etiology. The deep gray nuclei, brainstem and cerebellum are spared as before. However, there is subacute to chronic left cerebellar post ischemic appearing encephalomalacia.  Superimposed chronic posterior left MCA territory infarct with encephalomalacia. The area of abnormal FLAIR signal described on 10/08/2019 seems to correspond to a right temporal lobe sulcus seen on series 7 image 80 today, and there does appear to be faint abnormal although somewhat curvilinear enhancement there (series 10, image 6). No associated diffusion restriction. But postcontrast images also show scattered infarct related enhancement corresponding to areas of restricted diffusion, and also the left cerebellar encephalomalacia. No masslike areas of enhancement at this time. No dural thickening. No intracranial mass effect. No ventriculomegaly. Negative pituitary, cervicomedullary junction and visible spinal cord. IMPRESSION: Numerous small areas of abnormal brain enhancement - including faint enhancement at the indeterminate right temporal lobe area described yesterday - although all of these more resemble post ischemic and infarct related enhancement than cerebral metastatic disease. And there are definite underlying acute and chronic infarcts scattered throughout the brain. However, a repeat brain MRI without and with contrast is recommended (e.g. 6 to 12 weeks from now) to document expected post ischemic evolution of changes and exclude developing metastatic disease. Electronically Signed   By: Genevie Ann M.D.   On: 10/10/2019 10:00   US Carotid Bilateral (at [facility] and [facility] only)  Result Date: 10/09/2019 CLINICAL DATA:  Carotid atherosclerosis, known left ICA occlusion EXAM: BILATERAL CAROTID DUPLEX ULTRASOUND TECHNIQUE: Pearline Cables scale imaging, color Doppler and duplex ultrasound were performed of bilateral carotid and vertebral arteries in the neck. COMPARISON:  04/03/2019 CTA neck FINDINGS: Criteria: Quantification of carotid stenosis is based on velocity parameters that correlate the residual internal carotid diameter with NASCET-based stenosis levels, using the diameter of the distal internal carotid lumen as  the denominator for stenosis measurement. The following velocity measurements were obtained: RIGHT ICA: 102/30 cm/sec CCA: [id number] cm/sec SYSTOLIC ICA/CCA RATIO:  0.7 ECA: 107 cm/sec LEFT ICA: Occluded CCA: [id number] cm/sec SYSTOLIC ICA/CCA RATIO:  Not applicable ECA: [id number] cm/sec RIGHT CAROTID ARTERY: Minor echogenic shadowing plaque formation. No hemodynamically significant right ICA stenosis, velocity elevation, or turbulent flow. Degree of narrowing less than 50%. RIGHT VERTEBRAL ARTERY:  Antegrade LEFT CAROTID ARTERY: Chronic occlusion of the left ICA. Left CCA and ECA remain patent without evidence of atherosclerotic change. LEFT VERTEBRAL ARTERY:  Antegrade IMPRESSION: Minor right ICA narrowing, less than 50% by ultrasound criteria. Known chronic occlusion of the left ICA Patent antegrade vertebral flow bilaterally Electronically Signed   By: Jerilynn Mages.  Shick M.D.   On: 10/09/2019 09:16   ECHOCARDIOGRAM COMPLETE BUBBLE STUDY  Result Date: 10/09/2019   ECHOCARDIOGRAM REPORT   Patient Name:   Mark Foster Date of Exam: 10/09/2019 Medical Rec #:  [id number]    Height:       70.0 in Accession #:    [id number]   Weight:       150.0 lb Date of Birth:  1956-01-09     BSA:          1.85 m Patient Age:    54 years     BP:  125/85 mmHg Patient Gender: M            HR:           86 bpm. Exam Location:  [facility] Procedure: 2D Echo, Cardiac Doppler, Color Doppler and Saline Contrast Bubble            Study Indications:     Stroke 434.91  History:         Patient has prior history of Echocardiogram examinations, most                  recent 11/12/2018. Stroke; Risk Factors:Diabetes.  Sonographer:     Sherrie Sport RDCS (AE) Referring Phys:  [place] Diagnosing Phys: Ida Rogue MD IMPRESSIONS  1. Left ventricular ejection fraction, by visual estimation, is 60 to 65%. The left ventricle has normal function. There is no left ventricular hypertrophy.  2. The left ventricle has no regional wall motion abnormalities.  3. Left  ventricular diastolic parameters are consistent with Grade I diastolic dysfunction (impaired relaxation).  4. Global right ventricle has normal systolic function.The right ventricular size is normal. No increase in right ventricular wall thickness.  5. Left atrial size was normal.  6. Normal pulmonary artery systolic pressure.  7. Negative saline contrast study. FINDINGS  Left Ventricle: Left ventricular ejection fraction, by visual estimation, is 60 to 65%. The left ventricle has normal function. The left ventricle has no regional wall motion abnormalities. There is no left ventricular hypertrophy. Left ventricular diastolic parameters are consistent with Grade I diastolic dysfunction (impaired relaxation). Normal left atrial pressure. Right Ventricle: The right ventricular size is normal. No increase in right ventricular wall thickness. Global RV systolic function is has normal systolic function. The tricuspid regurgitant velocity is 1.86 m/s, and with an assumed right atrial pressure  of 10 mmHg, the estimated right ventricular systolic pressure is normal at 23.8 mmHg. Left Atrium: Left atrial size was normal in size. Right Atrium: Right atrial size was normal in size Pericardium: There is no evidence of pericardial effusion. Mitral Valve: The mitral valve is normal in structure. No evidence of mitral valve regurgitation. No evidence of mitral valve stenosis by observation. Tricuspid Valve: The tricuspid valve is normal in structure. Tricuspid valve regurgitation is not demonstrated. Aortic Valve: The aortic valve was not well visualized. Aortic valve regurgitation is not visualized. The aortic valve is structurally normal, with no evidence of sclerosis or stenosis. Aortic valve mean gradient measures 1.5 mmHg. Aortic valve peak gradient measures 2.4 mmHg. Aortic valve area, by VTI measures 4.40 cm. Pulmonic Valve: The pulmonic valve was normal in structure. Pulmonic valve regurgitation is not visualized.  Pulmonic regurgitation is not visualized. Aorta: The aortic root, ascending aorta and aortic arch are all structurally normal, with no evidence of dilitation or obstruction. Venous: The inferior vena cava is normal in size with greater than 50% respiratory variability, suggesting right atrial pressure of 3 mmHg. IAS/Shunts: No atrial level shunt detected by color flow Doppler. Agitated saline contrast was given intravenously to evaluate for intracardiac shunting. There is no evidence of a patent foramen ovale. No ventricular septal defect is seen or detected. There is no evidence of an atrial septal defect.  LEFT VENTRICLE PLAX 2D LVIDd:         4.59 cm  Diastology LVIDs:         2.61 cm  LV e' lateral:   12.60 cm/s LV PW:         1.03 cm  LV E/e'  lateral: 5.6 LV IVS:        1.03 cm  LV e' medial:    7.62 cm/s LVOT diam:     2.10 cm  LV E/e' medial:  9.3 LV SV:         72 ml LV SV Index:   39.34 LVOT Area:     3.46 cm  RIGHT VENTRICLE RV Basal diam:  3.34 cm RV S prime:     11.50 cm/s TAPSE (M-mode): 3.5 cm LEFT ATRIUM             Index       RIGHT ATRIUM           Index LA diam:        3.70 cm 2.00 cm/m  RA Area:     14.30 cm LA Vol (A2C):   35.4 ml 19.17 ml/m RA Volume:   36.40 ml  19.71 ml/m LA Vol (A4C):   25.7 ml 13.91 ml/m LA Biplane Vol: 30.8 ml 16.67 ml/m  AORTIC VALVE                   PULMONIC VALVE AV Area (Vmax):    3.93 cm    PV Vmax:        0.72 m/s AV Area (Vmean):   3.91 cm    PV Peak grad:   2.1 mmHg AV Area (VTI):     4.40 cm    RVOT Peak grad: 2 mmHg AV Vmax:           78.15 cm/s AV Vmean:          52.000 cm/s AV VTI:            0.167 m AV Peak Grad:      2.4 mmHg AV Mean Grad:      1.5 mmHg LVOT Vmax:         88.70 cm/s LVOT Vmean:        58.700 cm/s LVOT VTI:          0.213 m LVOT/AV VTI ratio: 1.27  AORTA Ao Root diam: 3.50 cm MITRAL VALVE                        TRICUSPID VALVE MV Area (PHT): 3.42 cm             TR Peak grad:   13.8 mmHg MV PHT:        64.38 msec           TR Vmax:         186.00 cm/s MV Decel Time: 222 msec MV E velocity: 70.80 cm/s 103 cm/s  SHUNTS MV A velocity: 93.10 cm/s 70.3 cm/s Systemic VTI:  0.21 m MV E/A ratio:  0.76       1.5       Systemic Diam: 2.10 cm  Ida Rogue MD Electronically signed by Ida Rogue MD Signature Date/Time: 10/09/2019/7:37:12 PM    Final     Medications:  I have reviewed the patient's current medications. Scheduled: .  stroke: mapping our early stages of recovery book   Does not apply Once  . aspirin  81 mg Oral Daily  . atorvastatin  80 mg Oral q1800  . clopidogrel  75 mg Oral Daily  . enoxaparin (LOVENOX) injection  40 mg Subcutaneous Q24H  . insulin aspart  0-9 Units Subcutaneous Q4H  . insulin glargine  42 Units Subcutaneous Q2200  . pantoprazole  20 mg  Oral Daily  . sertraline  50 mg Oral Daily  . tamsulosin  0.4 mg Oral Daily  . thyroid  30 mg Oral QAC breakfast    Assessment/Plan: 64 y.o. male with medical history significant of DM, vitamin D deficiency, pancreatic cancer (last chemo 4 weeks ago), hypothyroidism, diabetic neuropathy, stroke (2011 with stenosis of left vertebral artery and carotid occlusion) and HTN who presents after a syncopal episode.  MRI of the brain performed and personally reviewed.  MRI shows multiple, small bilateral frontal white matter and posterior right parietal lobe acute infarcts.  Etiology likely cardioembolic and amy be related to hypercoagulability from his cancer.  There was also a concerning area in the right temporal lobe that although may be infarct as well, can not rule out the possibility of a metastatic lesion.  MRI of the brain repeated with contrast and personally reviewed.  Temporal lesion felt to be most consistent with infarct.  Carotid dopplers continue to show occluded left ICA.  No hemodynamically significant RICA stenosis noted.  Echocardiogram shows no cardiac source of emboli with EF of 60-65%.  LDL 123.  A1c 12.1.  Patient admits to medication noncompliance.    Patient on ASA and statin prior to admission. Unclear etiology of syncopal episode.  EEG shows no epileptiform activity.  Orthostatic vitals are pending.    Recommendations: 1. Dual antiplatelet therapy with ASA 81mg  and Plavix 75mg  for three weeks with change to Plavix 75mg  daily alone as monotherapy after that time. 2. Outpatient follow up with cardiology for consideration of TEE and prolonged cardiac monitoring   3. Blood sugar management with target A1c<7.0.  Medication compliance stressed 4. Aggressive lipid management with target LDL<70.  Again medication compliance stressed. 5. Smoking cessation counseling 6. Repeat MRI of the brain on an outpatient basis in 6-12 weeks      LOS: 1 day   Mark Goodell, MD Neurology [phone] 10/10/2019  2:47 PM

## 2019-10-10 NOTE — Progress Notes (Addendum)
Pt is being discharged home. Discharge papers given and explained to pt.  Pt verbalized understanding.  Meds and f/u appointments reviewed. Rx given.   HHPT was arranged and set up by Delilah, case manager.

## 2019-10-10 NOTE — TOC Progression Note (Signed)
Transition of Care [facility]) - Progression Note    Patient Details  Name: Mark Foster MRN: [id number] Date of Birth: 1956-01-20  Transition of [facility]) CM/SW [place], RN Phone Number: 10/10/2019, 9:33 AM  Clinical Narrative:    Went to the room to see the patient to discuss DC plan and needs, he is not in the room, will attempt later        Expected Discharge Plan and Services                                                 Social Determinants of Health (SDOH) Interventions    Readmission Risk Interventions No flowsheet data found.

## 2019-10-10 NOTE — Progress Notes (Signed)
Physical Therapy Treatment Patient Details Name: Mark Foster MRN: [id number] DOB: 1955/12/28 Today's Date: 10/10/2019    History of Present Illness 64 y.o. male with medical history significant of DM2.  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  Here after syncopal episode with apart LOC, MRI of brain reveals likely small infarct.    PT Comments    Pt was awake supine in bed upon arriving. He agrees to PT session and reports no pain this date. He was able to ambulate 200 ft with cane without LOB. He does present with high level dynamic balance deficits and PT recommends HHPT to focus on balance deficits. He was able to get in/out of bed without assist. Overall tolerated session well and is progressing well with PT. Therapist discussed with pt balance deficits and recommendation of HHPT until balance improves.      Follow Up Recommendations  Home health PT(pt is agreeable)     Equipment Recommendations  None recommended by PT    Recommendations for Other Services       Precautions / Restrictions Precautions Precautions: Fall Restrictions Weight Bearing Restrictions: No    Mobility  Bed Mobility Overal bed mobility: Independent                Transfers Overall transfer level: Modified independent Equipment used: Straight cane             General transfer comment: Pt demonstarted ability to stand EOB and from recliner with SPC + supervision + gait belt. He has no difficulty standing.  Ambulation/Gait Ambulation/Gait assistance: Supervision Gait Distance (Feet): 200 Feet Assistive device: Straight cane Gait Pattern/deviations: WFL(Within Functional Limits)     General Gait Details: No LOB noted with pt demonstarting steady gait kinematics.    Stairs             Wheelchair Mobility    Modified Rankin (Stroke Patients Only)       Balance                                             Cognition Arousal/Alertness: Awake/alert Behavior During Therapy: WFL for tasks assessed/performed Overall Cognitive Status: Within Functional Limits for tasks assessed                                 General Comments: Pt was A and O x 4      Exercises      General Comments        Pertinent Vitals/Pain Pain Assessment: No/denies pain Pain Score: 0-No pain Faces Pain Scale: No hurt    Home Living                      Prior Function            PT Goals (current goals can now be found in the care plan section) Acute Rehab PT Goals Patient Stated Goal: " I'm ready to get out of here" Progress towards PT goals: Progressing toward goals    Frequency    7X/week      PT Plan Current plan remains appropriate    Co-evaluation              AM-PAC PT "6 Clicks" Mobility  Outcome Measure  Help needed turning from your back to your side while in a flat bed without using bedrails?: None Help needed moving from lying on your back to sitting on the side of a flat bed without using bedrails?: None Help needed moving to and from a bed to a chair (including a wheelchair)?: None Help needed standing up from a chair using your arms (e.g., wheelchair or bedside chair)?: None Help needed to walk in hospital room?: A Little Help needed climbing 3-5 steps with a railing? : A Little 6 Click Score: 22    End of Session Equipment Utilized During Treatment: Gait belt Activity Tolerance: Patient tolerated treatment well Patient left: in bed;with call bell/phone within reach Nurse Communication: Mobility status PT Visit Diagnosis: Unsteadiness on feet (R26.81);Difficulty in walking, not elsewhere classified (R26.2);Muscle weakness (generalized) (M62.81)     Time: [id number] PT Time Calculation (min) (ACUTE ONLY): 17 min  Charges:  $Gait Training: 8-22 mins                     Julaine Fusi  PTA 10/10/19, 12:03 PM

## 2019-10-10 NOTE — Plan of Care (Signed)
Pt does not appear to have any deficits currently. No signs of distress noted. Will continue to monitor.

## 2019-10-14 ENCOUNTER — Telehealth (INDEPENDENT_AMBULATORY_CARE_PROVIDER_SITE_OTHER): Payer: Self-pay | Admitting: Internal Medicine

## 2019-10-14 LAB — CANCER ANTIGEN 19-9: CA 19-9: 25 U/mL (ref 0–35)

## 2019-10-14 NOTE — Telephone Encounter (Signed)
Please call the patient and find out if the area is inflamed/has swelling etc.  If he is concerned, we will probably need to see him in the office tomorrow.

## 2019-10-16 NOTE — Telephone Encounter (Signed)
Will f/u if worse at [facility] or [facility] ER. Has appt soon. He wants to see about heart monitor.

## 2019-10-21 ENCOUNTER — Ambulatory Visit (INDEPENDENT_AMBULATORY_CARE_PROVIDER_SITE_OTHER): Payer: Medicare Other | Admitting: Nurse Practitioner

## 2019-10-21 ENCOUNTER — Other Ambulatory Visit: Payer: Self-pay

## 2019-10-21 ENCOUNTER — Encounter (INDEPENDENT_AMBULATORY_CARE_PROVIDER_SITE_OTHER): Payer: Self-pay | Admitting: Nurse Practitioner

## 2019-10-21 VITALS — BP 120/71 | HR 75 | Temp 99.2°F | Ht 70.0 in | Wt 158.6 lb

## 2019-10-21 DIAGNOSIS — I639 Cerebral infarction, unspecified: Secondary | ICD-10-CM

## 2019-10-21 DIAGNOSIS — E1165 Type 2 diabetes mellitus with hyperglycemia: Secondary | ICD-10-CM | POA: Diagnosis not present

## 2019-10-21 MED ORDER — INSULIN LISPRO (1 UNIT DIAL) 100 UNIT/ML (KWIKPEN): 3.0000 [IU] | PEN_INJECTOR | Freq: Three times a day (TID) | SUBCUTANEOUS | 0 refills | Status: DC

## 2019-10-21 MED ORDER — BASAGLAR KWIKPEN 100 UNIT/ML ~~LOC~~ SOPN: 30.0000 [IU] | PEN_INJECTOR | Freq: Every day | SUBCUTANEOUS | 1 refills | Status: DC

## 2019-10-21 NOTE — Progress Notes (Signed)
Subjective:  Patient ID: Mark Foster, male    DOB: [birth year]  Age: 64 y.o. MRN: [id number]  CC:  Chief Complaint  Patient presents with  . Other    Post-Hospital Follow-up      HPI  This patient comes in today for the above.  He was recently discharged from the hospital on 10/10/2019 after suffering a stroke.  Per discharge reports it is recommended that he stop his aspirin in 3 weeks and continue on Plavix at that point only.  He is also recommended to follow-up with cardiology and neurology for further evaluation.  Recommendations are for patient to undergo heart monitor to see if he could be having an arrhythmia which could have contributed to the stroke.  Patient is also currently being treated for pancreatic cancer, and it is thought his hypercoagulopathy state related to his cancer could also have been a contributing cause to the stroke.  Per chart review I also note a recommendation that MRI should be repeated in 6 to 12 weeks.  It was also noted that the patient's A1c that was collected during his hospital visit was 12.  He does continue on insulin glargine 42 units every evening.  He was being followed by Dr. Dorris Fetch (endocrinologist) for his diabetes, but tells me he has not followed up with him in quite some time.  He was also not monitoring his blood sugars closely recently prior to this stroke.  Since discharge he tells me he is experiencing fatigue, but otherwise does not seem to have any major neurologic deficits following his most recent stroke.  He is mainly concerned about getting in to see specialists for follow-up and getting his diabetes under better control.  He does have his glucometer with him today and shows me some blood sugars over the last few days.  His fasting glucose ranges from 64-111, but during the day it ranges from 184 to >480.  He tells me that he can tell when he is hypoglycemic, and when this occurs he will often overtreat by " eating a lot of carbs".  He  also mentions that sometimes when he sees that his blood sugar is very elevated he will try to treat this by injecting himself with an additional 20 units of his long-acting insulin.   Past Medical History:  Diagnosis Date  . Cervical compression fracture ([place])   . Cervical spinal stenosis   . Diabetic neuropathy (HCC)    Bilateral legs  . Diverticulitis   . Family history of pancreatic cancer   . History of kidney stones   . Hypothyroidism   . Pancreatic cancer ([place])   . Stenosis of left vertebral artery   . Stroke [facility])    2011  . Type 2 diabetes mellitus (HCC)       Family History  Problem Relation Age of Onset  . Stroke Mother   . Pancreatic cancer Father 67       d. 8  . Stroke Maternal Grandmother   . Heart attack Maternal Grandfather   . Heart Problems Paternal Grandfather   . Scoliosis Daughter   . Muscular dystrophy Grandson     Social History   Social History Narrative  . Not on file   Social History   Tobacco Use  . Smoking status: Current Every Day Smoker  . Smokeless tobacco: Never Used  Substance Use Topics  . Alcohol use: Not Currently     No outpatient medications have been marked as taking  for the 10/21/19 encounter (Office Visit) with Ailene Ards, NP.    ROS:  Review of Systems  Constitutional: Positive for malaise/fatigue.  Respiratory: Negative for cough and shortness of breath.   Cardiovascular: Negative for chest pain.     Objective:   Today's Vitals: BP 120/71 (BP Location: Left Arm, Patient Position: Sitting, Cuff Size: Normal)   Pulse 75   Temp 99.2 F (37.3 C) (Temporal)   Ht 5\' 10"  (1.778 m)   Wt 158 lb 9.6 oz (71.9 kg)   SpO2 96%   BMI 22.76 kg/m  Vitals with BMI 10/21/2019 10/10/2019 10/10/2019  Height 5\' 10"  - -  Weight 158 lbs 10 oz - -  BMI [id number] - -  Systolic [id number]  Diastolic 71 82 54  Pulse 75 89 86     Physical Exam Vitals reviewed.  Constitutional:      Appearance: Normal appearance.  HENT:      Head: Normocephalic and atraumatic.  Cardiovascular:     Rate and Rhythm: Normal rate and regular rhythm.  Pulmonary:     Effort: Pulmonary effort is normal.     Breath sounds: Normal breath sounds.  Musculoskeletal:     Cervical back: Neck supple.  Skin:    General: Skin is warm and dry.  Neurological:     Mental Status: He is alert and oriented to person, place, and time.  Psychiatric:        Mood and Affect: Mood normal.        Behavior: Behavior normal.        Thought Content: Thought content normal.        Judgment: Judgment normal.          Assessment   1. Cerebrovascular accident (CVA), unspecified mechanism ([place])   2. Diabetes mellitus type 2 in nonobese (HCC)   3. Uncontrolled type 2 diabetes mellitus with hyperglycemia ([place])       Tests ordered Orders Placed This Encounter  Procedures  . Ambulatory referral to Cardiology  . Ambulatory referral to Neurology  . Ambulatory referral to Endocrinology     Plan: Please see assessment and plan per problem list below.   Meds ordered this encounter  Medications  . insulin lispro (HUMALOG KWIKPEN) 100 UNIT/ML KwikPen    Sig: Inject 0.03 mLs (3 Units total) into the skin 3 (three) times daily.    Dispense:  6 mL    Refill:  0    Order Specific Question:   Supervising Provider    Answer:   Anastasio Champion, NIMISH C [id number]  . Insulin Glargine (BASAGLAR KWIKPEN) 100 UNIT/ML SOPN    Sig: Inject 0.3 mLs (30 Units total) into the skin at bedtime.    Dispense:  15 mL    Refill:  1    Order Specific Question:   Supervising Provider    Answer:   Doree Albee [id number]    Patient to follow-up in 1 week.  Ailene Ards, NP

## 2019-10-21 NOTE — Assessment & Plan Note (Signed)
I will send referral to cardiology and neurology for additional evaluation and management of this patient who is recently discharged from hospital following a stroke.  I also told him to stop his aspirin around 10/29/2019, but to continue his Plavix.  I encouraged him to also communicate with his neurologist the recommendation to have a repeat brain MRI completed in about 6 to 12 weeks.  He tells me he understands.

## 2019-10-21 NOTE — Patient Instructions (Signed)
Thank you for choosing [facility] as your medical provider! If you have any questions or concerns regarding your health care, please do not hesitate to call our office.  Your last dose of aspirin should be on 10/29/2019.  After that just continue Plavix.  We changed her insulin regimen.  You will inject Humalog (3 units) into the skin 3 times a day with meals as long as you are going to eat at least 50% of your meal.  You will inject Basaglar (30 units) into the skin once daily in the evening.  Make sure to check your blood sugar 4 times a day (check it before each meal and before bedtime), and keep a log of this.  If you have 2 or more episodes where your sugar drops below 80 please call this office prior to your next appointment.  If you have 3 or more episodes where your sugar rises above 300 please call this office prior to her next appointment.  If your blood sugar drops below 80 you treat hypoglycemia by drinking 1/2 cup of juice or soda.  Then check your blood sugar in 15 minutes, as long as you feel well and your sugar has increased you should not have to eat or drink anything else.  I will also refer you to cardiology, neurology, and endocrinology for further evaluation and management.  Please follow-up as scheduled next week. We look forward to seeing you again soon!   At [facility] we value your feedback. You may receive a survey about your visit today. Please share your experience as we strive to create trusting relationships with our patients to provide genuine, compassionate, quality care.  We appreciate your understanding and patience as we review any laboratory studies, imaging, and other diagnostic tests that are ordered as we care for you. We do our best to address any and all results in a timely manner. If you do not hear about test results within 1 week, please do not hesitate to contact us. If we referred you to a specialist during your visit or ordered  imaging testing, contact the office if you have not been contacted to be scheduled within 1 weeks.  We also encourage the use of MyChart, which contains your medical information for your review as well. If you are not enrolled in this feature, an access code is on this after visit summary for your convenience. Thank you for allowing [place] to be involved in your care.  '

## 2019-10-21 NOTE — Assessment & Plan Note (Signed)
I will send referral to endocrinology back to Dr. Dorris Fetch so he can get reestablished at the practice.  In the meantime I have made adjustments to his insulin.  He is willing to do a long-acting and short acting regimen of insulin.  I recommended he reduce his insulin glargine down from 42 units in the evening to 30 units in the evening and will start him on 3 units of Humalog with each meal as long as he is planning on eating 50% of the meal or more.  He also tells me he will continue to check his blood sugar 4 times daily and keep a log of this.  I have instructed him on the definition of a hypoglycemic episode and what to do to treat this.  I have told him not to overtreat this, but instead to treat appropriately with half a cup of juice or soda and recheck in 15 minutes.  He was instructed to call the office if he experiences 2 or more hypoglycemic events before his next visit AND/OR if his blood sugar gets above 300 on 3 separate checks.  He tells me he understands.  We will follow-up for a telephone visit in 1 week, but he was encouraged to call me sooner if need be.

## 2019-10-22 ENCOUNTER — Other Ambulatory Visit (INDEPENDENT_AMBULATORY_CARE_PROVIDER_SITE_OTHER): Payer: Self-pay | Admitting: Nurse Practitioner

## 2019-10-22 DIAGNOSIS — E1165 Type 2 diabetes mellitus with hyperglycemia: Secondary | ICD-10-CM

## 2019-10-22 MED ORDER — NOVOLOG FLEXPEN 100 UNIT/ML ~~LOC~~ SOPN: 3.0000 [IU] | PEN_INJECTOR | Freq: Three times a day (TID) | SUBCUTANEOUS | 0 refills | Status: DC

## 2019-10-22 NOTE — Progress Notes (Signed)
Changing patient from Humalog to NovoLog FlexPen based on patient's insurance drug formulary list.

## 2019-10-23 ENCOUNTER — Ambulatory Visit: Payer: Medicare Other | Admitting: Cardiovascular Disease

## 2019-10-23 ENCOUNTER — Ambulatory Visit (INDEPENDENT_AMBULATORY_CARE_PROVIDER_SITE_OTHER): Payer: Medicare Other | Admitting: Nurse Practitioner

## 2019-10-23 ENCOUNTER — Other Ambulatory Visit: Payer: Self-pay

## 2019-10-23 DIAGNOSIS — E1165 Type 2 diabetes mellitus with hyperglycemia: Secondary | ICD-10-CM

## 2019-10-23 MED ORDER — BASAGLAR KWIKPEN 100 UNIT/ML ~~LOC~~ SOPN: 33.0000 [IU] | PEN_INJECTOR | Freq: Every day | SUBCUTANEOUS | 1 refills | Status: DC

## 2019-10-23 NOTE — Progress Notes (Signed)
This is a check-in phone call with this patient to see how his blood sugars have been running since we made changes to his insulin regimen at his office visit earlier this week.    At his last visit he had reported that his fasting blood sugars were at goal when he was taking his insulin glargine 42 units daily.  However he reported that his blood sugar during the day was much higher and this was concerning to him.   I then proceeded to reduce his insulin glargine to 30 units daily and started him also on Humalog 3 units 3 times a day with his meals.  Of note, I did receive notice that his insurance does not cover Humalog on their formulary so when he refills his short acting insulin it will be NovoLog instead.   Today, I was able to talk to this patient on the phone and he tells me that he started his insulin changes yesterday.  He noticed that his fasting blood sugar yesterday was 150 and this morning it was 200.  He started his meal coverage at lunch yesterday and his dinnertime blood sugar was 193.  I have recommended that he increase his glargine up to 33 units at night and to continue his 3 units with each meal as long as his blood sugar is at least 110 before each meal and he is planning on eating at least 50% of his meal.  He tells me he understands.  I will check in with him again next week to see how his blood sugars are running, but encouraged him to call the office with questions prior to me being able to contact him.  He has also been referred back to endocrinology for further assistance with managing his diabetes.

## 2019-10-28 ENCOUNTER — Telehealth (INDEPENDENT_AMBULATORY_CARE_PROVIDER_SITE_OTHER): Payer: Self-pay | Admitting: Nurse Practitioner

## 2019-10-28 ENCOUNTER — Ambulatory Visit (INDEPENDENT_AMBULATORY_CARE_PROVIDER_SITE_OTHER): Payer: Medicare Other | Admitting: Nurse Practitioner

## 2019-10-28 NOTE — Telephone Encounter (Signed)
I called this patient today to check in with him to see how his blood sugars have been running.  He tells me that they have been better since we adjusted his insulin.  He tells me he has had a couple of high blood sugars but not like they were previously.  I see that he is scheduled to see Dr. Dorris Fetch his endocrinologist on 11/12/2019.  The patient is comfortable with waiting to see Dr. Dorris Fetch as scheduled and rescheduling his next upcoming appointment with me in May.  His appointment has been scheduled for follow-up in May, and he was encouraged to call us with any questions or concerns prior to his next upcoming appointment.

## 2019-10-29 ENCOUNTER — Other Ambulatory Visit: Payer: Self-pay | Admitting: "Endocrinology

## 2019-10-29 ENCOUNTER — Telehealth: Payer: Self-pay | Admitting: "Endocrinology

## 2019-10-29 MED ORDER — LANTUS SOLOSTAR 100 UNIT/ML ~~LOC~~ SOPN: 34.0000 [IU] | PEN_INJECTOR | Freq: Every day | SUBCUTANEOUS | 0 refills | Status: DC

## 2019-10-29 NOTE — Telephone Encounter (Signed)
Per Dr Dorris Fetch, he will send in one RX for Lantus, for further refills, he needs to contact PCP since they are writing the script. Left VM

## 2019-10-29 NOTE — Telephone Encounter (Signed)
I will take care of it.

## 2019-10-29 NOTE — Telephone Encounter (Signed)
Medicare will not cover Basaglar. He wants it to be changed to Lantus.

## 2019-11-03 ENCOUNTER — Ambulatory Visit (INDEPENDENT_AMBULATORY_CARE_PROVIDER_SITE_OTHER): Payer: Medicare Other | Admitting: Nurse Practitioner

## 2019-11-04 ENCOUNTER — Other Ambulatory Visit (HOSPITAL_COMMUNITY): Payer: Self-pay | Admitting: Interventional Radiology

## 2019-11-04 DIAGNOSIS — I639 Cerebral infarction, unspecified: Secondary | ICD-10-CM

## 2019-11-06 ENCOUNTER — Other Ambulatory Visit (INDEPENDENT_AMBULATORY_CARE_PROVIDER_SITE_OTHER): Payer: Self-pay | Admitting: Internal Medicine

## 2019-11-06 MED ORDER — ATORVASTATIN CALCIUM 80 MG PO TABS: ORAL_TABLET | ORAL | 0 refills | Status: DC

## 2019-11-07 ENCOUNTER — Other Ambulatory Visit: Payer: Self-pay

## 2019-11-07 DIAGNOSIS — N138 Other obstructive and reflux uropathy: Secondary | ICD-10-CM

## 2019-11-07 DIAGNOSIS — N401 Enlarged prostate with lower urinary tract symptoms: Secondary | ICD-10-CM

## 2019-11-07 MED ORDER — TAMSULOSIN HCL 0.4 MG PO CAPS: 0.4000 mg | ORAL_CAPSULE | Freq: Every day | ORAL | 3 refills | Status: DC

## 2019-11-07 MED ORDER — GABAPENTIN 300 MG PO CAPS: 300.0000 mg | ORAL_CAPSULE | Freq: Two times a day (BID) | ORAL | 0 refills | Status: DC

## 2019-11-10 ENCOUNTER — Other Ambulatory Visit (INDEPENDENT_AMBULATORY_CARE_PROVIDER_SITE_OTHER): Payer: Self-pay

## 2019-11-10 ENCOUNTER — Other Ambulatory Visit (HOSPITAL_COMMUNITY): Payer: Self-pay | Admitting: *Deleted

## 2019-11-10 MED ORDER — ATORVASTATIN CALCIUM 80 MG PO TABS: ORAL_TABLET | ORAL | 0 refills | Status: DC

## 2019-11-10 MED ORDER — PROCHLORPERAZINE MALEATE 10 MG PO TABS: 10.0000 mg | ORAL_TABLET | Freq: Four times a day (QID) | ORAL | 3 refills | Status: DC | PRN

## 2019-11-10 MED ORDER — ONDANSETRON HCL 8 MG PO TABS: 8.0000 mg | ORAL_TABLET | Freq: Four times a day (QID) | ORAL | 3 refills | Status: DC | PRN

## 2019-11-10 MED ORDER — TRAMADOL HCL 50 MG PO TABS: 50.0000 mg | ORAL_TABLET | Freq: Four times a day (QID) | ORAL | 2 refills | Status: DC | PRN

## 2019-11-11 ENCOUNTER — Telehealth: Payer: Self-pay

## 2019-11-11 ENCOUNTER — Other Ambulatory Visit: Payer: Self-pay

## 2019-11-11 ENCOUNTER — Ambulatory Visit: Payer: Medicare HMO | Admitting: Cardiovascular Disease

## 2019-11-11 ENCOUNTER — Encounter: Payer: Self-pay | Admitting: Cardiovascular Disease

## 2019-11-11 VITALS — BP 112/62 | HR 70 | Temp 98.0°F | Ht 70.0 in | Wt 157.0 lb

## 2019-11-11 DIAGNOSIS — I259 Chronic ischemic heart disease, unspecified: Secondary | ICD-10-CM | POA: Diagnosis not present

## 2019-11-11 DIAGNOSIS — I639 Cerebral infarction, unspecified: Secondary | ICD-10-CM

## 2019-11-11 DIAGNOSIS — R079 Chest pain, unspecified: Secondary | ICD-10-CM | POA: Diagnosis not present

## 2019-11-11 DIAGNOSIS — I200 Unstable angina: Secondary | ICD-10-CM | POA: Diagnosis not present

## 2019-11-11 DIAGNOSIS — I6522 Occlusion and stenosis of left carotid artery: Secondary | ICD-10-CM

## 2019-11-11 DIAGNOSIS — I10 Essential (primary) hypertension: Secondary | ICD-10-CM

## 2019-11-11 DIAGNOSIS — E785 Hyperlipidemia, unspecified: Secondary | ICD-10-CM

## 2019-11-11 DIAGNOSIS — R002 Palpitations: Secondary | ICD-10-CM | POA: Diagnosis not present

## 2019-11-11 MED ORDER — METOPROLOL TARTRATE 100 MG PO TABS: ORAL_TABLET | ORAL | 0 refills | Status: DC

## 2019-11-11 NOTE — Progress Notes (Signed)
CARDIOLOGY CONSULT NOTE  Patient ID: Mark Foster MRN: [id number] DOB/AGE: May 26, 1956 64 y.o.  Admit date: (Not on file) Primary Physician: Mark Albee, MD  Reason for Consultation: CVA  HPI: Mark Foster) is a 64 y.o. male who is being seen today for the evaluation of CVA at the request of Mark Ards, NP.   I reviewed notes from his hospitalization in the EMR from February.  Discharge summary indicates a plan to follow-up with cardiology for TEE with a loop recorder versus a Holter monitor for embolic stroke.  Past medical history includes left carotid artery occlusion, history of pancreatic cancer for which he underwent Whipple surgery in 2020 at [name] and treated with chemotherapy, and hypertension.  He also had a stroke in 2011.  MRI of the brain showed multiple, small bilateral frontal white matter and posterior right parietal lobe acute infarcts.  Etiology was deemed to be cardioembolic with some relation to hypercoagulable state from his cancer.  Carotid Doppler showed occluded left internal carotid artery with no significant R ICA stenosis.  Echocardiogram demonstrated normal LV systolic function, EF 60 to 65%.  He was on aspirin Plavix for 3 weeks and now is only on clopidogrel.  He has felt fatigued ever since his stroke.  He does continue to play guitar at church and at [employer] in [place] on Wednesday nights.  He has had precordial chest aches on a few occasions.  He denies palpitations.  He has occasional shortness of breath.  He walks with a cane.  Family history: Mother died of a CVA in 76 at age 69.  Father died of pancreatic cancer at age 7 4.  Social history: He is originally from [place], [place].  His son lives there.  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  He likes playing country music and classic rock on guitar.  He also plays at his church and at [employer] in [place] on Wednesday  nights.    Allergies  Allergen Reactions  . Demerol [Meperidine Hcl] Other (See Comments)    convulsions    Current Outpatient Medications  Medication Sig Dispense Refill  . atorvastatin (LIPITOR) 80 MG tablet TAKE 1 TABLET(80 MG) BY MOUTH DAILY AT 6 PM 90 tablet 0  . clopidogrel (PLAVIX) 75 MG tablet Take 1 tablet (75 mg total) by mouth daily. 60 tablet 1  . ECHINACEA EXTRACT PO Take 2 tablets by mouth daily.     Marland Kitchen gabapentin (NEURONTIN) 300 MG capsule Take 1 capsule (300 mg total) by mouth 2 (two) times daily. 180 capsule 0  . insulin aspart (NOVOLOG FLEXPEN) 100 UNIT/ML FlexPen Inject 3 Units into the skin 3 (three) times daily with meals. 9 mL 0  . Insulin Glargine (LANTUS SOLOSTAR) 100 UNIT/ML Solostar Pen Inject 34 Units into the skin daily. 5 pen 0  . Insulin Pen Needle 32G X 4 MM MISC 1 Device by Does not apply route daily. 50 each 2  . loperamide (IMODIUM A-D) 2 MG tablet Take 1 tablet (2 mg total) by mouth as needed. Take 2 at diarrhea onset , then 1 every 2hr until 12hrs with no BM. May take 2 every 4hrs at night. If diarrhea recurs repeat. 100 tablet 1  . meclizine (ANTIVERT) 25 MG tablet Take 1 tablet (25 mg total) by mouth 3 (three) times daily as needed for dizziness. 30 tablet 0  . NP THYROID 30 MG tablet Take 30 mg by mouth  daily before breakfast.   2  . Omega-3 1000 MG CAPS Take 1,000 mg by mouth daily.    . ondansetron (ZOFRAN) 8 MG tablet Take 1 tablet (8 mg total) by mouth every 6 (six) hours as needed for nausea or vomiting. 60 tablet 3  . pantoprazole (PROTONIX) 20 MG tablet Take 20 mg by mouth as needed.     . prochlorperazine (COMPAZINE) 10 MG tablet Take 1 tablet (10 mg total) by mouth every 6 (six) hours as needed for nausea or vomiting. 30 tablet 3  . sertraline (ZOLOFT) 50 MG tablet TAKE 1 TABLET BY MOUTH DAILY 90 tablet 1  . tamsulosin (FLOMAX) 0.4 MG CAPS capsule Take 1 capsule (0.4 mg total) by mouth daily. 90 capsule 3  . traMADol (ULTRAM) 50 MG tablet  Take 1 tablet (50 mg total) by mouth every 6 (six) hours as needed. 30 tablet 2  . vitamin B-12 (CYANOCOBALAMIN) 1000 MCG tablet Take 1,000 mcg by mouth daily.     No current facility-administered medications for this visit.   Facility-Administered Medications Ordered in Other Visits  Medication Dose Route Frequency Provider Last Rate Last Admin  . 0.9 %  sodium chloride infusion   Intravenous Continuous Mark Jack, MD 20 mL/hr at 12/30/18 1351 New Bag at 12/30/18 1351  . 0.9 %  sodium chloride infusion   Intravenous Continuous Mark Jack, MD 20 mL/hr at 12/30/18 1401 New Bag at 12/30/18 1401  . dextrose 5 % solution   Intravenous Once Mark Jack, MD      . heparin lock flush 100 unit/mL  500 Units Intracatheter Once PRN Mark Jack, MD      . sodium chloride flush (NS) 0.9 % injection 10 mL  10 mL Intracatheter PRN Mark Jack, MD   10 mL at 12/30/18 0911  . sodium chloride flush (NS) 0.9 % injection 10 mL  10 mL Intracatheter PRN Mark Jack, MD   10 mL at 05/21/19 0845    Past Medical History:  Diagnosis Date  . Cervical compression fracture ([place])   . Cervical spinal stenosis   . Diabetic neuropathy ([facility])    Bilateral legs  . Diverticulitis   . Family history of pancreatic cancer   . History of kidney stones   . Hypothyroidism   . Pancreatic cancer ([facility] [place])   . Stenosis of left vertebral artery   . Stroke [name])    2011  . Type 2 diabetes mellitus ([place])     Past Surgical History:  Procedure Laterality Date  . APPENDECTOMY    . COLON RESECTION     For diverticulitis  . COLON SURGERY    . ESOPHAGOGASTRODUODENOSCOPY N/A 10/03/2018   Procedure: ESOPHAGOGASTRODUODENOSCOPY (EGD);  Surgeon: Mark Banister, MD;  Location: [name];  Service: Endoscopy;  Laterality: N/A;  . EUS N/A 10/03/2018   Procedure: UPPER ENDOSCOPIC ULTRASOUND (EUS) RADIAL;  Surgeon: Mark Banister, MD;  Location: [facility];  Service: Endoscopy;   Laterality: N/A;  . FINE NEEDLE ASPIRATION N/A 10/03/2018   Procedure: FINE NEEDLE ASPIRATION (FNA) LINEAR;  Surgeon: Mark Banister, MD;  Location: [facility];  Service: Endoscopy;  Laterality: N/A;  . IR ANGIO INTRA EXTRACRAN SEL COM CAROTID INNOMINATE BILAT MOD SED  01/21/2018  . IR ANGIO INTRA EXTRACRAN SEL COM CAROTID INNOMINATE UNI R MOD SED  03/21/2018  . IR ANGIO VERTEBRAL SEL VERTEBRAL BILAT MOD SED  01/21/2018  . IR TRANSCATH EXCRAN VERT OR CAR A STENT  03/21/2018  . PORTACATH PLACEMENT Left 12/23/2018  Procedure: INSERTION PORT-A-CATH;  Surgeon: Virl Cagey, MD;  Location: [facility];  Service: General;  Laterality: Left;  . RADIOLOGY WITH ANESTHESIA N/A 03/21/2018   Procedure: IR WITH ANESTHESIA WITH STENT PLACEMENT;  Surgeon: Luanne Bras, MD;  Location: [facility];  Service: Radiology;  Laterality: N/A;  . ROTATOR CUFF REPAIR     Left    Social History   Socioeconomic History  . Marital status: Single    Spouse name: Not on file  . Number of children: 2  . Years of education: Not on file  . Highest education level: Not on file  Occupational History  . Occupation: Estate agent  Tobacco Use  . Smoking status: Former Smoker    Start date: 10/06/2019  . Smokeless tobacco: Never Used  Substance and Sexual Activity  . Alcohol use: Yes    Alcohol/week: 1.0 standard drinks    Types: 1 Cans of beer per week    Comment: rare   . Drug use: Never  . Sexual activity: Not on file  Other Topics Concern  . Not on file  Social History Narrative  . Not on file   Social Determinants of Health   Financial Resource Strain: Medium Risk  . Difficulty of Paying Living Expenses: Somewhat hard  Food Insecurity: No Food Insecurity  . Worried About Charity fundraiser in the Last Year: Never true  . Ran Out of Food in the Last Year: Never true  Transportation Needs: No Transportation Needs  . Lack of Transportation (Medical): No  . Lack of Transportation (Non-Medical): No   Physical Activity: Inactive  . Days of Exercise per Week: 0 days  . Minutes of Exercise per Session: 0 min  Stress: Stress Concern Present  . Feeling of Stress : To some extent  Social Connections: Somewhat Isolated  . Frequency of Communication with Friends and Family: More than three times a week  . Frequency of Social Gatherings with Friends and Family: Once a week  . Attends Religious Services: More than 4 times per year  . Active Member of Clubs or Organizations: No  . Attends Archivist Meetings: Never  . Marital Status: Divorced  Human resources officer Violence: Not At Risk  . Fear of Current or Ex-Partner: No  . Emotionally Abused: No  . Physically Abused: No  . Sexually Abused: No     No family history of premature CAD in 1st degree relatives.  Current Meds  Medication Sig  . atorvastatin (LIPITOR) 80 MG tablet TAKE 1 TABLET(80 MG) BY MOUTH DAILY AT 6 PM  . clopidogrel (PLAVIX) 75 MG tablet Take 1 tablet (75 mg total) by mouth daily.  Marland Kitchen ECHINACEA EXTRACT PO Take 2 tablets by mouth daily.   Marland Kitchen gabapentin (NEURONTIN) 300 MG capsule Take 1 capsule (300 mg total) by mouth 2 (two) times daily.  . insulin aspart (NOVOLOG FLEXPEN) 100 UNIT/ML FlexPen Inject 3 Units into the skin 3 (three) times daily with meals.  . Insulin Glargine (LANTUS SOLOSTAR) 100 UNIT/ML Solostar Pen Inject 34 Units into the skin daily.  . Insulin Pen Needle 32G X 4 MM MISC 1 Device by Does not apply route daily.  Marland Kitchen loperamide (IMODIUM A-D) 2 MG tablet Take 1 tablet (2 mg total) by mouth as needed. Take 2 at diarrhea onset , then 1 every 2hr until 12hrs with no BM. May take 2 every 4hrs at night. If diarrhea recurs repeat.  . meclizine (ANTIVERT) 25 MG tablet Take 1 tablet (25 mg total)  by mouth 3 (three) times daily as needed for dizziness.  . NP THYROID 30 MG tablet Take 30 mg by mouth daily before breakfast.   . Omega-3 1000 MG CAPS Take 1,000 mg by mouth daily.  . ondansetron (ZOFRAN) 8 MG tablet  Take 1 tablet (8 mg total) by mouth every 6 (six) hours as needed for nausea or vomiting.  . pantoprazole (PROTONIX) 20 MG tablet Take 20 mg by mouth as needed.   . prochlorperazine (COMPAZINE) 10 MG tablet Take 1 tablet (10 mg total) by mouth every 6 (six) hours as needed for nausea or vomiting.  . sertraline (ZOLOFT) 50 MG tablet TAKE 1 TABLET BY MOUTH DAILY  . tamsulosin (FLOMAX) 0.4 MG CAPS capsule Take 1 capsule (0.4 mg total) by mouth daily.  . traMADol (ULTRAM) 50 MG tablet Take 1 tablet (50 mg total) by mouth every 6 (six) hours as needed.  . vitamin B-12 (CYANOCOBALAMIN) 1000 MCG tablet Take 1,000 mcg by mouth daily.      Review of systems complete and found to be negative unless listed above in HPI    Physical exam Blood pressure 112/62, pulse 70, temperature 98 F (36.7 C), height 5\' 10"  (1.778 m), weight 157 lb (71.2 kg), SpO2 99 %. General: NAD Neck: No JVD, no thyromegaly or thyroid nodule.  Lungs: Clear to auscultation bilaterally with normal respiratory effort. CV: Nondisplaced PMI. Regular rate and rhythm, normal S1/S2, no S3/S4, no murmur.  No peripheral edema.  No carotid bruit.    Abdomen: Soft, nontender, no distention.  Skin: Intact without lesions or rashes.  Neurologic: Alert and oriented x 3.  Psych: Normal affect. Extremities: No clubbing or cyanosis.  HEENT: Normal.   ECG: I personally reviewed the ECG performed on 10/08/2019 which demonstrated sinus rhythm with no arrhythmias and no significant ischemic abnormalities.   Labs: Lab Results  Component Value Date/Time   K 3.8 10/10/2019 07:00 AM   BUN 16 10/10/2019 07:00 AM   CREATININE 0.48 (L) 10/10/2019 07:00 AM   CREATININE 0.81 09/30/2019 11:13 AM   ALT 14 10/08/2019 08:09 PM   ALT 19 12/11/2018 10:18 AM   TSH 1.41 09/30/2019 11:13 AM   HGB 14.4 10/10/2019 07:00 AM   HGB 14.9 12/11/2018 10:18 AM     Lipids: Lab Results  Component Value Date/Time   LDLCALC 123 (H) 10/09/2019 01:59 AM   CHOL  186 10/09/2019 01:59 AM   TRIG 105 10/09/2019 01:59 AM   HDL 42 10/09/2019 01:59 AM      MRI brain 10/10/2019:  IMPRESSION: Numerous small areas of abnormal brain enhancement - including faint enhancement at the indeterminate right temporal lobe area described yesterday - although all of these more resemble post ischemic and infarct related enhancement than cerebral metastatic disease.  And there are definite underlying acute and chronic infarcts scattered throughout the brain. However, a repeat brain MRI without and with contrast is recommended (e.g. 6 to 12 weeks from now) to document expected post ischemic evolution of changes and exclude developing metastatic disease.    Echocardiogram 10/09/2019:  1. Left ventricular ejection fraction, by visual estimation, is 60 to  65%. The left ventricle has normal function. There is no left ventricular  hypertrophy.  2. The left ventricle has no regional wall motion abnormalities.  3. Left ventricular diastolic parameters are consistent with Grade I  diastolic dysfunction (impaired relaxation).  4. Global right ventricle has normal systolic function.The right  ventricular size is normal. No increase in right ventricular wall  thickness.  5. Left atrial size was normal.  6. Normal pulmonary artery systolic pressure.  7. Negative saline contrast study.      ASSESSMENT AND PLAN:   1.  CVA: MRI of the brain showed multiple, small bilateral frontal white matter and posterior right parietal lobe acute infarcts.  Etiology was deemed to be cardioembolic with some relation to hypercoagulable state from his cancer.  He denies palpitations.  Currently on clopidogrel and statin.  I will arrange for TEE and 30-day event monitor.  2.  Hyperlipidemia: Continue statin therapy.  3.  Carotid artery occlusion: Carotid Dopplers on 10/09/2019 demonstrated chronic occlusion of left ICA and less than 50% stenosis of the right ICA.  There was patent  antegrade vertebral flow bilaterally.  He is on clopidogrel and statin therapy.  4.  Chest pain: He has several cardiovascular is factors.  I will arrange for coronary CT angiography.    Disposition: Follow up in 3 months  Signed: Kate Sable, M.D., F.A.C.C.  11/11/2019, 9:07 AM

## 2019-11-11 NOTE — Telephone Encounter (Signed)
Done

## 2019-11-11 NOTE — Telephone Encounter (Signed)
Kennieth Rad, Rep. From [employer], asked that the diagnosis codes for monitor be faxed to her attention at  Fax: [phone]  Thanks renee

## 2019-11-11 NOTE — H&P (View-Only) (Signed)
CARDIOLOGY CONSULT NOTE  Patient ID: Mark Foster MRN: [id number] DOB/AGE: 1956/08/17 64 y.o.  Admit date: (Not on file) Primary Physician: Doree Albee, MD  Reason for Consultation: CVA  HPI: Mark Foster) is a 64 y.o. male who is being seen today for the evaluation of CVA at the request of Ailene Ards, NP.   I reviewed notes from his hospitalization in the EMR from February.  Discharge summary indicates a plan to follow-up with cardiology for TEE with a loop recorder versus a Holter monitor for embolic stroke.  Past medical history includes left carotid artery occlusion, history of pancreatic cancer for which he underwent Whipple surgery in 2020 at [facility] and treated with chemotherapy, and hypertension.  He also had a stroke in 2011.  MRI of the brain showed multiple, small bilateral frontal white matter and posterior right parietal lobe acute infarcts.  Etiology was deemed to be cardioembolic with some relation to hypercoagulable state from his cancer.  Carotid Doppler showed occluded left internal carotid artery with no significant R ICA stenosis.  Echocardiogram demonstrated normal LV systolic function, EF 60 to 65%.  He was on aspirin Plavix for 3 weeks and now is only on clopidogrel.  He has felt fatigued ever since his stroke.  He does continue to play guitar at church and at [employer] in [place] on Wednesday nights.  He has had precordial chest aches on a few occasions.  He denies palpitations.  He has occasional shortness of breath.  He walks with a cane.  Family history: Mother died of a CVA in 49 at age 55.  Father died of pancreatic cancer at age 60 41.  Social history: He is originally from [place], [place].  His son lives there.  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  He likes playing country music and classic rock on guitar.  He also plays at his church and at [employer] in [place] on Wednesday  nights.    Allergies  Allergen Reactions  . Demerol [Meperidine Hcl] Other (See Comments)    convulsions    Current Outpatient Medications  Medication Sig Dispense Refill  . atorvastatin (LIPITOR) 80 MG tablet TAKE 1 TABLET(80 MG) BY MOUTH DAILY AT 6 PM 90 tablet 0  . clopidogrel (PLAVIX) 75 MG tablet Take 1 tablet (75 mg total) by mouth daily. 60 tablet 1  . ECHINACEA EXTRACT PO Take 2 tablets by mouth daily.     Marland Kitchen gabapentin (NEURONTIN) 300 MG capsule Take 1 capsule (300 mg total) by mouth 2 (two) times daily. 180 capsule 0  . insulin aspart (NOVOLOG FLEXPEN) 100 UNIT/ML FlexPen Inject 3 Units into the skin 3 (three) times daily with meals. 9 mL 0  . Insulin Glargine (LANTUS SOLOSTAR) 100 UNIT/ML Solostar Pen Inject 34 Units into the skin daily. 5 pen 0  . Insulin Pen Needle 32G X 4 MM MISC 1 Device by Does not apply route daily. 50 each 2  . loperamide (IMODIUM A-D) 2 MG tablet Take 1 tablet (2 mg total) by mouth as needed. Take 2 at diarrhea onset , then 1 every 2hr until 12hrs with no BM. May take 2 every 4hrs at night. If diarrhea recurs repeat. 100 tablet 1  . meclizine (ANTIVERT) 25 MG tablet Take 1 tablet (25 mg total) by mouth 3 (three) times daily as needed for dizziness. 30 tablet 0  . NP THYROID 30 MG tablet Take 30 mg by mouth  daily before breakfast.   2  . Omega-3 1000 MG CAPS Take 1,000 mg by mouth daily.    . ondansetron (ZOFRAN) 8 MG tablet Take 1 tablet (8 mg total) by mouth every 6 (six) hours as needed for nausea or vomiting. 60 tablet 3  . pantoprazole (PROTONIX) 20 MG tablet Take 20 mg by mouth as needed.     . prochlorperazine (COMPAZINE) 10 MG tablet Take 1 tablet (10 mg total) by mouth every 6 (six) hours as needed for nausea or vomiting. 30 tablet 3  . sertraline (ZOLOFT) 50 MG tablet TAKE 1 TABLET BY MOUTH DAILY 90 tablet 1  . tamsulosin (FLOMAX) 0.4 MG CAPS capsule Take 1 capsule (0.4 mg total) by mouth daily. 90 capsule 3  . traMADol (ULTRAM) 50 MG tablet  Take 1 tablet (50 mg total) by mouth every 6 (six) hours as needed. 30 tablet 2  . vitamin B-12 (CYANOCOBALAMIN) 1000 MCG tablet Take 1,000 mcg by mouth daily.     No current facility-administered medications for this visit.   Facility-Administered Medications Ordered in Other Visits  Medication Dose Route Frequency Provider Last Rate Last Admin  . 0.9 %  sodium chloride infusion   Intravenous Continuous Derek Jack, MD 20 mL/hr at 12/30/18 1351 New Bag at 12/30/18 1351  . 0.9 %  sodium chloride infusion   Intravenous Continuous Derek Jack, MD 20 mL/hr at 12/30/18 1401 New Bag at 12/30/18 1401  . dextrose 5 % solution   Intravenous Once Derek Jack, MD      . heparin lock flush 100 unit/mL  500 Units Intracatheter Once PRN Derek Jack, MD      . sodium chloride flush (NS) 0.9 % injection 10 mL  10 mL Intracatheter PRN Derek Jack, MD   10 mL at 12/30/18 0911  . sodium chloride flush (NS) 0.9 % injection 10 mL  10 mL Intracatheter PRN Derek Jack, MD   10 mL at 05/21/19 0845    Past Medical History:  Diagnosis Date  . Cervical compression fracture ([place])   . Cervical spinal stenosis   . Diabetic neuropathy ([facility])    Bilateral legs  . Diverticulitis   . Family history of pancreatic cancer   . History of kidney stones   . Hypothyroidism   . Pancreatic cancer ([place])   . Stenosis of left vertebral artery   . Stroke [facility])    2011  . Type 2 diabetes mellitus ([place])     Past Surgical History:  Procedure Laterality Date  . APPENDECTOMY    . COLON RESECTION     For diverticulitis  . COLON SURGERY    . ESOPHAGOGASTRODUODENOSCOPY N/A 10/03/2018   Procedure: ESOPHAGOGASTRODUODENOSCOPY (EGD);  Surgeon: [name], MD;  Location: [name];  Service: Endoscopy;  Laterality: N/A;  . EUS N/A 10/03/2018   Procedure: UPPER ENDOSCOPIC ULTRASOUND (EUS) RADIAL;  Surgeon: [name], MD;  Location: [facility];  Service: Endoscopy;   Laterality: N/A;  . FINE NEEDLE ASPIRATION N/A 10/03/2018   Procedure: FINE NEEDLE ASPIRATION (FNA) LINEAR;  Surgeon: [name], MD;  Location: [facility];  Service: Endoscopy;  Laterality: N/A;  . IR ANGIO INTRA EXTRACRAN SEL COM CAROTID INNOMINATE BILAT MOD SED  01/21/2018  . IR ANGIO INTRA EXTRACRAN SEL COM CAROTID INNOMINATE UNI R MOD SED  03/21/2018  . IR ANGIO VERTEBRAL SEL VERTEBRAL BILAT MOD SED  01/21/2018  . IR TRANSCATH EXCRAN VERT OR CAR A STENT  03/21/2018  . PORTACATH PLACEMENT Left 12/23/2018  Procedure: INSERTION PORT-A-CATH;  Surgeon: Virl Cagey, MD;  Location: [facility];  Service: General;  Laterality: Left;  . RADIOLOGY WITH ANESTHESIA N/A 03/21/2018   Procedure: IR WITH ANESTHESIA WITH STENT PLACEMENT;  Surgeon: Luanne Bras, MD;  Location: [place];  Service: Radiology;  Laterality: N/A;  . ROTATOR CUFF REPAIR     Left    Social History   Socioeconomic History  . Marital status: Single    Spouse name: Not on file  . Number of children: 2  . Years of education: Not on file  . Highest education level: Not on file  Occupational History  . Occupation: Estate agent  Tobacco Use  . Smoking status: Former Smoker    Start date: 10/06/2019  . Smokeless tobacco: Never Used  Substance and Sexual Activity  . Alcohol use: Yes    Alcohol/week: 1.0 standard drinks    Types: 1 Cans of beer per week    Comment: rare   . Drug use: Never  . Sexual activity: Not on file  Other Topics Concern  . Not on file  Social History Narrative  . Not on file   Social Determinants of Health   Financial Resource Strain: Medium Risk  . Difficulty of Paying Living Expenses: Somewhat hard  Food Insecurity: No Food Insecurity  . Worried About Charity fundraiser in the Last Year: Never true  . Ran Out of Food in the Last Year: Never true  Transportation Needs: No Transportation Needs  . Lack of Transportation (Medical): No  . Lack of Transportation (Non-Medical): No   Physical Activity: Inactive  . Days of Exercise per Week: 0 days  . Minutes of Exercise per Session: 0 min  Stress: Stress Concern Present  . Feeling of Stress : To some extent  Social Connections: Somewhat Isolated  . Frequency of Communication with Friends and Family: More than three times a week  . Frequency of Social Gatherings with Friends and Family: Once a week  . Attends Religious Services: More than 4 times per year  . Active Member of Clubs or Organizations: No  . Attends Archivist Meetings: Never  . Marital Status: Divorced  Human resources officer Violence: Not At Risk  . Fear of Current or Ex-Partner: No  . Emotionally Abused: No  . Physically Abused: No  . Sexually Abused: No     No family history of premature CAD in 1st degree relatives.  Current Meds  Medication Sig  . atorvastatin (LIPITOR) 80 MG tablet TAKE 1 TABLET(80 MG) BY MOUTH DAILY AT 6 PM  . clopidogrel (PLAVIX) 75 MG tablet Take 1 tablet (75 mg total) by mouth daily.  Marland Kitchen ECHINACEA EXTRACT PO Take 2 tablets by mouth daily.   Marland Kitchen gabapentin (NEURONTIN) 300 MG capsule Take 1 capsule (300 mg total) by mouth 2 (two) times daily.  . insulin aspart (NOVOLOG FLEXPEN) 100 UNIT/ML FlexPen Inject 3 Units into the skin 3 (three) times daily with meals.  . Insulin Glargine (LANTUS SOLOSTAR) 100 UNIT/ML Solostar Pen Inject 34 Units into the skin daily.  . Insulin Pen Needle 32G X 4 MM MISC 1 Device by Does not apply route daily.  Marland Kitchen loperamide (IMODIUM A-D) 2 MG tablet Take 1 tablet (2 mg total) by mouth as needed. Take 2 at diarrhea onset , then 1 every 2hr until 12hrs with no BM. May take 2 every 4hrs at night. If diarrhea recurs repeat.  . meclizine (ANTIVERT) 25 MG tablet Take 1 tablet (25 mg total)  by mouth 3 (three) times daily as needed for dizziness.  . NP THYROID 30 MG tablet Take 30 mg by mouth daily before breakfast.   . Omega-3 1000 MG CAPS Take 1,000 mg by mouth daily.  . ondansetron (ZOFRAN) 8 MG tablet  Take 1 tablet (8 mg total) by mouth every 6 (six) hours as needed for nausea or vomiting.  . pantoprazole (PROTONIX) 20 MG tablet Take 20 mg by mouth as needed.   . prochlorperazine (COMPAZINE) 10 MG tablet Take 1 tablet (10 mg total) by mouth every 6 (six) hours as needed for nausea or vomiting.  . sertraline (ZOLOFT) 50 MG tablet TAKE 1 TABLET BY MOUTH DAILY  . tamsulosin (FLOMAX) 0.4 MG CAPS capsule Take 1 capsule (0.4 mg total) by mouth daily.  . traMADol (ULTRAM) 50 MG tablet Take 1 tablet (50 mg total) by mouth every 6 (six) hours as needed.  . vitamin B-12 (CYANOCOBALAMIN) 1000 MCG tablet Take 1,000 mcg by mouth daily.      Review of systems complete and found to be negative unless listed above in HPI    Physical exam Blood pressure 112/62, pulse 70, temperature 98 F (36.7 C), height 5\' 10"  (1.778 m), weight 157 lb (71.2 kg), SpO2 99 %. General: NAD Neck: No JVD, no thyromegaly or thyroid nodule.  Lungs: Clear to auscultation bilaterally with normal respiratory effort. CV: Nondisplaced PMI. Regular rate and rhythm, normal S1/S2, no S3/S4, no murmur.  No peripheral edema.  No carotid bruit.    Abdomen: Soft, nontender, no distention.  Skin: Intact without lesions or rashes.  Neurologic: Alert and oriented x 3.  Psych: Normal affect. Extremities: No clubbing or cyanosis.  HEENT: Normal.   ECG: I personally reviewed the ECG performed on 10/08/2019 which demonstrated sinus rhythm with no arrhythmias and no significant ischemic abnormalities.   Labs: Lab Results  Component Value Date/Time   K 3.8 10/10/2019 07:00 AM   BUN 16 10/10/2019 07:00 AM   CREATININE 0.48 (L) 10/10/2019 07:00 AM   CREATININE 0.81 09/30/2019 11:13 AM   ALT 14 10/08/2019 08:09 PM   ALT 19 12/11/2018 10:18 AM   TSH 1.41 09/30/2019 11:13 AM   HGB 14.4 10/10/2019 07:00 AM   HGB 14.9 12/11/2018 10:18 AM     Lipids: Lab Results  Component Value Date/Time   LDLCALC 123 (H) 10/09/2019 01:59 AM   CHOL  186 10/09/2019 01:59 AM   TRIG 105 10/09/2019 01:59 AM   HDL 42 10/09/2019 01:59 AM      MRI brain 10/10/2019:  IMPRESSION: Numerous small areas of abnormal brain enhancement - including faint enhancement at the indeterminate right temporal lobe area described yesterday - although all of these more resemble post ischemic and infarct related enhancement than cerebral metastatic disease.  And there are definite underlying acute and chronic infarcts scattered throughout the brain. However, a repeat brain MRI without and with contrast is recommended (e.g. 6 to 12 weeks from now) to document expected post ischemic evolution of changes and exclude developing metastatic disease.    Echocardiogram 10/09/2019:  1. Left ventricular ejection fraction, by visual estimation, is 60 to  65%. The left ventricle has normal function. There is no left ventricular  hypertrophy.  2. The left ventricle has no regional wall motion abnormalities.  3. Left ventricular diastolic parameters are consistent with Grade I  diastolic dysfunction (impaired relaxation).  4. Global right ventricle has normal systolic function.The right  ventricular size is normal. No increase in right ventricular wall  thickness.  5. Left atrial size was normal.  6. Normal pulmonary artery systolic pressure.  7. Negative saline contrast study.      ASSESSMENT AND PLAN:   1.  CVA: MRI of the brain showed multiple, small bilateral frontal white matter and posterior right parietal lobe acute infarcts.  Etiology was deemed to be cardioembolic with some relation to hypercoagulable state from his cancer.  He denies palpitations.  Currently on clopidogrel and statin.  I will arrange for TEE and 30-day event monitor.  2.  Hyperlipidemia: Continue statin therapy.  3.  Carotid artery occlusion: Carotid Dopplers on 10/09/2019 demonstrated chronic occlusion of left ICA and less than 50% stenosis of the right ICA.  There was patent  antegrade vertebral flow bilaterally.  He is on clopidogrel and statin therapy.  4.  Chest pain: He has several cardiovascular is factors.  I will arrange for coronary CT angiography.    Disposition: Follow up in 3 months  Signed: Kate Sable, M.D., F.A.C.C.  11/11/2019, 9:07 AM

## 2019-11-11 NOTE — Patient Instructions (Addendum)
Medication Instructions:    STOP Aspirin  *If you need a refill on your cardiac medications before your next appointment, please call your pharmacy*   Lab Work: None today If you have labs (blood work) drawn today and your tests are completely normal, you will receive your results only by: Marland Kitchen MyChart Message (if you have MyChart) OR . A paper copy in the mail If you have any lab test that is abnormal or we need to change your treatment, we will call you to review the results.   Testing/Procedures: Your physician has recommended that you wear an event monitor. Event monitors are medical devices that record the heart's electrical activity. Doctors most often [place] these monitors to diagnose arrhythmias. Arrhythmias are problems with the speed or rhythm of the heartbeat. The monitor is a small, portable device. You can wear one while you do your normal daily activities. This is usually used to diagnose what is causing palpitations/syncope (passing out).  Please see Reece Levy at the front desk regarding your TEE apt   Your physician has requested that you have cardiac CT. Cardiac computed tomography (CT) is a painless test that uses an x-ray machine to take clear, detailed pictures of your heart. For further information please visit [email or web address]. Please follow instruction sheet as given.     Follow-Up: At [facility], you and your health needs are our priority.  As part of our continuing mission to provide you with exceptional heart care, we have created designated Provider Care Teams.  These Care Teams include your primary Cardiologist (physician) and Advanced Practice Providers (APPs -  Physician Assistants and Nurse Practitioners) who all work together to provide you with the care you need, when you need it.  We recommend signing up for the patient portal called "MyChart".  Sign up information is provided on this After Visit Summary.  MyChart is used to connect with patients  for Virtual Visits (Telemedicine).  Patients are able to view lab/test results, encounter notes, upcoming appointments, etc.  Non-urgent messages can be sent to your provider as well.   To learn more about what you can do with MyChart, go to [email or web address].    Your next appointment:   3 month(s)  The format for your next appointment:   In Person  Provider:   Kate Sable, MD   Other Instructions See attached Cardiac Ct instructions

## 2019-11-12 ENCOUNTER — Other Ambulatory Visit (HOSPITAL_COMMUNITY): Payer: Self-pay | Admitting: *Deleted

## 2019-11-12 ENCOUNTER — Ambulatory Visit (INDEPENDENT_AMBULATORY_CARE_PROVIDER_SITE_OTHER): Payer: Medicare HMO | Admitting: "Endocrinology

## 2019-11-12 ENCOUNTER — Other Ambulatory Visit: Payer: Self-pay

## 2019-11-12 ENCOUNTER — Encounter: Payer: Self-pay | Admitting: "Endocrinology

## 2019-11-12 ENCOUNTER — Telehealth: Payer: Self-pay | Admitting: "Endocrinology

## 2019-11-12 VITALS — BP 132/77 | HR 74 | Ht 70.0 in | Wt 163.8 lb

## 2019-11-12 DIAGNOSIS — E782 Mixed hyperlipidemia: Secondary | ICD-10-CM | POA: Diagnosis not present

## 2019-11-12 DIAGNOSIS — E1165 Type 2 diabetes mellitus with hyperglycemia: Secondary | ICD-10-CM | POA: Diagnosis not present

## 2019-11-12 DIAGNOSIS — E039 Hypothyroidism, unspecified: Secondary | ICD-10-CM | POA: Diagnosis not present

## 2019-11-12 DIAGNOSIS — I10 Essential (primary) hypertension: Secondary | ICD-10-CM

## 2019-11-12 MED ORDER — LANTUS SOLOSTAR 100 UNIT/ML ~~LOC~~ SOPN: 36.0000 [IU] | PEN_INJECTOR | Freq: Every day | SUBCUTANEOUS | 2 refills | Status: DC

## 2019-11-12 MED ORDER — ONDANSETRON HCL 8 MG PO TABS: 4.0000 mg | ORAL_TABLET | Freq: Four times a day (QID) | ORAL | 3 refills | Status: DC | PRN

## 2019-11-12 MED ORDER — NOVOLOG FLEXPEN 100 UNIT/ML ~~LOC~~ SOPN: 5.0000 [IU] | PEN_INJECTOR | Freq: Three times a day (TID) | SUBCUTANEOUS | 2 refills | Status: DC

## 2019-11-12 MED ORDER — INSULIN LISPRO (1 UNIT DIAL) 100 UNIT/ML (KWIKPEN): 5.0000 [IU] | PEN_INJECTOR | Freq: Three times a day (TID) | SUBCUTANEOUS | 0 refills | Status: DC

## 2019-11-12 NOTE — Telephone Encounter (Signed)
Called [facility] they stated pt had already purchased a 30 day supply but they would put the prescription for the brand name novolog on file for his next refill.

## 2019-11-12 NOTE — Telephone Encounter (Signed)
Rx for humalog sent to pharmacy to replace novolog.

## 2019-11-12 NOTE — Telephone Encounter (Signed)
Pt said novolog is not covered by insurance. Please advise.

## 2019-11-12 NOTE — Progress Notes (Signed)
11/12/2019, 10:14 AM                                 Endocrinology Telehealth Visit Follow up Note -During COVID -19 Pandemic  I connected with Mark Foster on 11/12/2019   by telephone and verified that I am speaking with the correct person using two identifiers. Mark Foster, 1956/07/15. he has verbally consented to this visit. All issues noted in this document were discussed and addressed. The format was not optimal for physical exam.   Subjective:    Patient ID: Mark Foster, male    DOB: 1956/03/28.  Mark Foster is being engaged in telehealth in follow-up for management of currently uncontrolled symptomatic pancreatic diabetes, hypothyroidism, hyperlipidemia. PMD:  Doree Albee, MD.   Past Medical History:  Diagnosis Date  . Cervical compression fracture ([place])   . Cervical spinal stenosis   . Diabetic neuropathy ([facility])    Bilateral legs  . Diverticulitis   . Family history of pancreatic cancer   . History of kidney stones   . Hypothyroidism   . Pancreatic cancer ([place])   . Stenosis of left vertebral artery   . Stroke [facility])    2011  . Type 2 diabetes mellitus ([place])    Past Surgical History:  Procedure Laterality Date  . APPENDECTOMY    . COLON RESECTION     For diverticulitis  . COLON SURGERY    . ESOPHAGOGASTRODUODENOSCOPY N/A 10/03/2018   Procedure: ESOPHAGOGASTRODUODENOSCOPY (EGD);  Surgeon: [name], MD;  Location: [name];  Service: Endoscopy;  Laterality: N/A;  . EUS N/A 10/03/2018   Procedure: UPPER ENDOSCOPIC ULTRASOUND (EUS) RADIAL;  Surgeon: [name], MD;  Location: [facility];  Service: Endoscopy;  Laterality: N/A;  . FINE NEEDLE ASPIRATION N/A 10/03/2018   Procedure: FINE NEEDLE ASPIRATION (FNA) LINEAR;  Surgeon: [name], MD;  Location: [facility];  Service: Endoscopy;  Laterality: N/A;  . IR ANGIO INTRA EXTRACRAN SEL COM CAROTID INNOMINATE BILAT  MOD SED  01/21/2018  . IR ANGIO INTRA EXTRACRAN SEL COM CAROTID INNOMINATE UNI R MOD SED  03/21/2018  . IR ANGIO VERTEBRAL SEL VERTEBRAL BILAT MOD SED  01/21/2018  . IR TRANSCATH EXCRAN VERT OR CAR A STENT  03/21/2018  . PORTACATH PLACEMENT Left 12/23/2018   Procedure: INSERTION PORT-A-CATH;  Surgeon: Virl Cagey, MD;  Location: [facility];  Service: General;  Laterality: Left;  . RADIOLOGY WITH ANESTHESIA N/A 03/21/2018   Procedure: IR WITH ANESTHESIA WITH STENT PLACEMENT;  Surgeon: Luanne Bras, MD;  Location: [facility];  Service: Radiology;  Laterality: N/A;  . ROTATOR CUFF REPAIR     Left   Social History   Socioeconomic History  . Marital status: Single    Spouse name: Not on file  . Number of children: 2  . Years of education: Not on file  . Highest education level: Not on file  Occupational History  . Occupation: Estate agent  Tobacco Use  . Smoking status: Former Smoker    Start date: 10/06/2019  . Smokeless tobacco: Never Used  Substance and Sexual Activity  . Alcohol use: Yes    Alcohol/week: 1.0 standard drinks    Types: 1 Cans of beer per week    Comment: rare   . Drug use: Never  . Sexual activity: Not on file  Other Topics Concern  . Not on file  Social History Narrative  . Not on file   Social Determinants of Health   Financial Resource Strain: Medium Risk  . Difficulty of Paying Living Expenses: Somewhat hard  Food Insecurity: No Food Insecurity  . Worried About Charity fundraiser in the Last Year: Never true  . Ran Out of Food in the Last Year: Never true  Transportation Needs: No Transportation Needs  . Lack of Transportation (Medical): No  . Lack of Transportation (Non-Medical): No  Physical Activity: Inactive  . Days of Exercise per Week: 0 days  . Minutes of Exercise per Session: 0 min  Stress: Stress Concern Present  . Feeling of Stress : To some extent  Social Connections: Somewhat Isolated  . Frequency of Communication with Friends and  Family: More than three times a week  . Frequency of Social Gatherings with Friends and Family: Once a week  . Attends Religious Services: More than 4 times per year  . Active Member of Clubs or Organizations: No  . Attends Archivist Meetings: Never  . Marital Status: Divorced   Outpatient Encounter Medications as of 11/12/2019  Medication Sig  . atorvastatin (LIPITOR) 80 MG tablet TAKE 1 TABLET(80 MG) BY MOUTH DAILY AT 6 PM (Patient taking differently: Take 80 mg by mouth every evening. )  . clopidogrel (PLAVIX) 75 MG tablet Take 1 tablet (75 mg total) by mouth daily.  Marland Kitchen ECHINACEA EXTRACT PO Take 2 tablets by mouth daily.   Marland Kitchen gabapentin (NEURONTIN) 300 MG capsule Take 1 capsule (300 mg total) by mouth 2 (two) times daily.  . insulin aspart (NOVOLOG FLEXPEN) 100 UNIT/ML FlexPen Inject 5-11 Units into the skin 3 (three) times daily before meals.  . insulin glargine (LANTUS SOLOSTAR) 100 UNIT/ML Solostar Pen Inject 36 Units into the skin at bedtime.  . Insulin Pen Needle 32G X 4 MM MISC 1 Device by Does not apply route daily.  Marland Kitchen lidocaine-prilocaine (EMLA) cream Apply 1 application topically daily as needed (prior to port being accessed (~every 3 months)).  Marland Kitchen loperamide (IMODIUM A-D) 2 MG tablet Take 1 tablet (2 mg total) by mouth as needed. Take 2 at diarrhea onset , then 1 every 2hr until 12hrs with no BM. May take 2 every 4hrs at night. If diarrhea recurs repeat. (Patient taking differently: Take 2-4 mg by mouth 4 (four) times daily as needed for diarrhea or loose stools. )  . meclizine (ANTIVERT) 25 MG tablet Take 1 tablet (25 mg total) by mouth 3 (three) times daily as needed for dizziness.  . metoprolol tartrate (LOPRESSOR) 100 MG tablet Take 1 tablet (100 mg) 2 hrs before CT  . NP THYROID 30 MG tablet Take 30 mg by mouth daily before breakfast.   . Omega-3 1000 MG CAPS Take 1,000 mg by mouth daily.  . ondansetron (ZOFRAN) 8 MG tablet Take 1 tablet (8 mg total) by mouth every 6  (six) hours as needed for nausea or vomiting.  . pantoprazole (PROTONIX) 20 MG tablet Take 20 mg by mouth daily as needed (heartburn/indigestion.).   Marland Kitchen prochlorperazine (COMPAZINE) 10 MG tablet Take 1 tablet (10 mg total) by mouth every 6 (six) hours as needed for nausea  or vomiting.  . sertraline (ZOLOFT) 50 MG tablet TAKE 1 TABLET BY MOUTH DAILY (Patient taking differently: Take 50 mg by mouth daily. )  . tamsulosin (FLOMAX) 0.4 MG CAPS capsule Take 1 capsule (0.4 mg total) by mouth daily.  . traMADol (ULTRAM) 50 MG tablet Take 1 tablet (50 mg total) by mouth every 6 (six) hours as needed. (Patient taking differently: Take 50 mg by mouth every 6 (six) hours as needed (pain.). )  . vitamin B-12 (CYANOCOBALAMIN) 1000 MCG tablet Take 1,000 mcg by mouth daily.  . [DISCONTINUED] atorvastatin (LIPITOR) 80 MG tablet Take 1 tablet (80 mg total) by mouth daily at 6 PM.  . [DISCONTINUED] insulin aspart (NOVOLOG FLEXPEN) 100 UNIT/ML FlexPen Inject 3 Units into the skin 3 (three) times daily with meals.  . [DISCONTINUED] Insulin Glargine (LANTUS SOLOSTAR) 100 UNIT/ML Solostar Pen Inject 34 Units into the skin daily. (Patient taking differently: Inject 33 Units into the skin at bedtime. )   Facility-Administered Encounter Medications as of 11/12/2019  Medication  . 0.9 %  sodium chloride infusion  . 0.9 %  sodium chloride infusion  . dextrose 5 % solution  . heparin lock flush 100 unit/mL  . sodium chloride flush (NS) 0.9 % injection 10 mL  . sodium chloride flush (NS) 0.9 % injection 10 mL    ALLERGIES: Allergies  Allergen Reactions  . Demerol [Meperidine Hcl] Other (See Comments)    convulsions    VACCINATION STATUS: Immunization History  Administered Date(s) Administered  . HiB (PRP-T) 10/23/2018  . Influenza,inj,Quad PF,6+ Mos 06/19/2019  . Influenza-Unspecified 08/02/2018  . Meningococcal Mcv4o 10/23/2018  . Pneumococcal Conjugate-13 10/23/2018    Diabetes He presents for his  follow-up diabetic visit. He has type 1 diabetes mellitus. Onset time: He was diagnosed at approximate age of 19 years. His disease course has been worsening (He did have A1c of 9.7% in May 2019.  However, more recently he has documented controlled glycemic profile averaging 134 over the last 2 weeks.). There are no hypoglycemic associated symptoms. Pertinent negatives for hypoglycemia include no confusion, headaches, pallor or seizures. Associated symptoms include polydipsia and polyuria. Pertinent negatives for diabetes include no chest pain, no fatigue, no polyphagia and no weakness. There are no hypoglycemic complications. Symptoms are worsening. Diabetic complications include a CVA. Pertinent negatives for diabetic complications include no heart disease. (Awaiting stent placement in his carotid artery.) Risk factors for coronary artery disease include diabetes mellitus, dyslipidemia, hypertension, male sex and sedentary lifestyle. Current diabetic treatment includes insulin injections. His weight is increasing steadily. He is following a generally unhealthy diet. When asked about meal planning, he reported none. He has not had a previous visit with a dietitian. He rarely participates in exercise. His home blood glucose trend is increasing steadily. (After missing his follow-up appointment, he  returns without his logs, recent A1c 12.1% increasing from 9%.) An ACE inhibitor/angiotensin II receptor blocker is not being taken.  Hyperlipidemia This is a chronic problem. The current episode started more than 1 year ago. The problem is uncontrolled. Exacerbating diseases include diabetes. Pertinent negatives include no chest pain, myalgias or shortness of breath. Current antihyperlipidemic treatment includes statins. Risk factors for coronary artery disease include diabetes mellitus, dyslipidemia, hypertension, male sex and a sedentary lifestyle.   Review of systems: Limited as above   Objective:    BP  132/77   Pulse 74   Ht 5\' 10"  (1.778 m)   Wt 163 lb 12.8 oz (74.3 kg)   BMI 23.50  kg/m   Wt Readings from Last 3 Encounters:  11/12/19 163 lb 12.8 oz (74.3 kg)  11/11/19 157 lb (71.2 kg)  10/21/19 158 lb 9.6 oz (71.9 kg)      CMP ( most recent) CMP     Component Value Date/Time   NA 139 10/10/2019 0700   K 3.8 10/10/2019 0700   CL 107 10/10/2019 0700   CO2 25 10/10/2019 0700   GLUCOSE 145 (H) 10/10/2019 0700   BUN 16 10/10/2019 0700   CREATININE 0.48 (L) 10/10/2019 0700   CREATININE 0.81 09/30/2019 1113   CALCIUM 8.6 (L) 10/10/2019 0700   PROT 6.4 (L) 10/08/2019 2009   ALBUMIN 3.7 10/08/2019 2009   AST 18 10/08/2019 2009   AST 16 12/11/2018 1018   ALT 14 10/08/2019 2009   ALT 19 12/11/2018 1018   ALKPHOS 56 10/08/2019 2009   BILITOT 0.8 10/08/2019 2009   BILITOT 0.8 12/11/2018 1018   GFRNONAA >60 10/10/2019 0700   GFRNONAA 94 09/30/2019 1113   GFRAA >60 10/10/2019 0700   GFRAA 109 09/30/2019 1113     Diabetic Labs (most recent): Lab Results  Component Value Date   HGBA1C 12.1 (H) 10/08/2019   HGBA1C 12.6 (H) 09/30/2019   HGBA1C 10.8 (H) 02/25/2019     Lipid Panel ( most recent) Lipid Panel     Component Value Date/Time   CHOL 186 10/09/2019 0159   TRIG 105 10/09/2019 0159   HDL 42 10/09/2019 0159   CHOLHDL 4.4 10/09/2019 0159   VLDL 21 10/09/2019 0159   LDLCALC 123 (H) 10/09/2019 0159    Assessment & Plan:   1. Uncontrolled type 2 diabetes mellitus with hyperglycemia ([place])  - Mark Foster has history of uncontrolled diabetes .  After missing his follow-up appointment, he  returns without his logs, recent A1c 12.1% increasing from 9%.   He reports that his fasting blood glucose readings are above 200 mg per DL.    -  Recent labs reviewed.  -his diabetes is likely pancreatic diabetes given his history of heavy alcohol use in the past. -He is advised to introduce more carbohydrates to his diet.  He would benefit from some weight gain.  -He is  not a suitable candidate for oral antidiabetic medications nor incretin therapy, hence he is advised to discontinue glipizide. -Given his pancreatic diabetes, even before he was diagnosed with pancreatic cancer, the best choice of therapy for his diabetes will be exclusively with insulin. -He is advised to increase Lantus to 36 units nightly, discussed and adjusted his NovoLog to 5 units 3 times daily AC for Premeal blood glucose readings between 90-150 mg per DL, and specific correction dose for readings above 150 mg per DL.  He agrees to continue to monitor blood glucose 4 times a day-before meals and at bedtime.    He will benefit from a CGM device, will be considered for freestyle libre device on his next visit in 10 days.    -Patient is encouraged to call clinic for blood glucose levels less than 70 or above 300 mg /dl.  - Patient specific target  A1c;  LDL, HDL, Triglycerides, and  Waist Circumference were discussed in detail. -He is anti-GAD antibodies, anti-islet cell antibodies are negative indicating his diabetes is not likely to be type I, likely related to his prior alcohol abuse causing pancreatic diabetes.  -The priority in this patient would be to avoid hypoglycemia due to lack of alpha cell  glucagon response on the background of alcohol  induced diabetes.  2) Lipids/HPL:   His recent labs show LDL of 123.  He is advised to continue Lipitor 40 mg p.o. nightly.    3) hypertension/blood pressure: His blood pressure is controlled to target.  He is on metoprolol 100 mg p.o. daily.  4)  Weight/Diet: He has gained 10+ pounds since last visit, a good development for him.  He is not a candidate for weight loss.  He is encouraged to continue to introduce complex carbohydrates.  Diet, exercise, and detailed carbohydrates information provided.  5) hypothyroidism -He is currently on NP thyroid 30 mg daily.  His thyroid function test in January 2021 were consistent with appropriate replacement.   He is advised to continue on his current dose for now.     - We discussed about the correct intake of his thyroid hormone, on empty stomach at fasting, with water, separated by at least 30 minutes from breakfast and other medications,  and separated by more than 4 hours from calcium, iron, multivitamins, acid reflux medications (PPIs). -Patient is made aware of the fact that thyroid hormone replacement is needed for life, dose to be adjusted by periodic monitoring of thyroid function tests.    6) Chronic Care/Health Maintenance:  -he  is on Statin medications and  is encouraged to continue to follow up with Ophthalmology, Dentist,  Podiatrist at least yearly or according to recommendations, and advised to  stay away from smoking. I have recommended yearly flu vaccine and pneumonia vaccination at least every 5 years;  and  sleep for at least 7 hours a day.  - I advised patient to maintain close follow up with Doree Albee, MD for primary care needs.    - Time spent on this patient care encounter:  35 min, of which > 50% was spent in  counseling and the rest reviewing his blood glucose logs , discussing his hypoglycemia and hyperglycemia episodes, reviewing his current and  previous labs / studies  ( including abstraction from other facilities) and medications  doses and developing a  long term treatment plan and documenting his care.   Please refer to Patient Instructions for Blood Glucose Monitoring and Insulin/Medications Dosing Guide"  in media tab for additional information. Please  also refer to " Patient Self Inventory" in the Media  tab for reviewed elements of pertinent patient history.  Mark Foster participated in the discussions, expressed understanding, and voiced agreement with the above plans.  All questions were answered to his satisfaction. he is encouraged to contact clinic should he have any questions or concerns prior to his return visit.   Follow up plan: - Return in  about 10 days (around 11/22/2019), or office, for Follow up with Meter and Logs Only - no Labs.  Mark Lloyd, MD [facility] [address] [place], [place] [address] Phone: [phone]  Fax: [phone]    11/12/2019, 10:14 AM  This note was partially dictated with voice recognition software. Similar sounding words can be transcribed inadequately or may not  be corrected upon review.

## 2019-11-12 NOTE — Patient Instructions (Signed)

## 2019-11-12 NOTE — Telephone Encounter (Signed)
Patient said it needs to be brand name for novolog and it will cost him $35.00

## 2019-11-13 ENCOUNTER — Other Ambulatory Visit: Payer: Self-pay | Admitting: Cardiovascular Disease

## 2019-11-13 ENCOUNTER — Ambulatory Visit: Payer: Medicare Other | Admitting: Cardiovascular Disease

## 2019-11-13 DIAGNOSIS — I639 Cerebral infarction, unspecified: Secondary | ICD-10-CM

## 2019-11-13 NOTE — Patient Instructions (Signed)
Mark Foster  11/13/2019     @PREFPERIOPPHARMACY @   Your procedure is scheduled on  11/18/2019 .  Report to [facility] at  0900   A.M.  Call this number if you have problems the morning of surgery:  [phone]   Remember:  Do not eat or drink after midnight.                     Take these medicines the morning of surgery with A SIP OF WATER  Gabapentin, antivert(if needed), zoloft,thyroid, zofran or compazine (if needed), protonix, tramadol(if needed). Take 1/2 of your usual night time insulin dosage the night before your procedure. DO NOT take any medications for diabetes the morning of your procedure. DO NOT miss any dosages of your plavix before your procedure.    Do not wear jewelry, make-up or nail polish.  Do not wear lotions, powders, or perfumes. Please wear deodorant and brush your teeth.  Do not shave 48 hours prior to surgery.  Men may shave face and neck.  Do not bring valuables to the hospital.  [facility] is not responsible for any belongings or valuables.  Contacts, dentures or bridgework may not be worn into surgery.  Leave your suitcase in the car.  After surgery it may be brought to your room.  For patients admitted to the hospital, discharge time will be determined by your treatment team.  Patients discharged the day of surgery will not be allowed to drive home.   Name and phone number of your driver:   family Special instructions:  DO NOT smoke the morning of your procedure.  Please read over the following fact sheets that you were given. Anesthesia Post-op Instructions and Care and Recovery After Surgery       Transesophageal Echocardiogram Transesophageal echocardiogram (TEE) is a test that uses sound waves to take pictures of your heart. TEE is done by passing a flexible tube down the esophagus. The esophagus is the tube that carries food from the throat to the stomach. The pictures give detailed images of your heart. This can help your  doctor see if there are problems with your heart. What happens before the procedur General instructions  You will need to take out any dentures or retainers.  Plan to have someone take you home from the hospital or clinic.  If you will be going home right after the procedure, plan to have someone with you for 24 hours.  Ask your doctor about: ? Changing or stopping your normal medicines. This is important if you take diabetes medicines or blood thinners. ? Taking over-the-counter medicines, vitamins, herbs, and supplements. ? Taking medicines such as aspirin and ibuprofen. These medicines can thin your blood. Do not take these medicines unless your doctor tells you to take them. What happens during the procedure?  To lower your risk of infection, your doctors will wash or clean their hands.  An IV will be put into one of your veins.  You will be given a medicine to help you relax (sedative).  A medicine may be sprayed or gargled. This numbs the back of your throat.  Your blood pressure, heart rate, and breathing will be watched.  You may be asked to lay on your left side.  A bite block will be placed in your mouth. This keeps you from biting the tube.  The tip of the TEE probe will be placed into the back of your mouth.  You will be asked to swallow.  Your doctor will take pictures of your heart.  The probe and bite block will be taken out. The procedure may vary among doctors and hospitals. What happens after the procedure?   Your blood pressure, heart rate, breathing rate, and blood oxygen level will be watched until the medicines you were given have worn off.  When you first wake up, your throat may feel sore and numb. This will get better over time. You will not be allowed to eat or drink until the numbness has gone away.  Do not drive for 24 hours if you were given a medicine to help you relax. Summary  TEE is a test that uses sound waves to take pictures of your  heart.  You will be given a medicine to help you relax.  Do not drive for 24 hours if you were given a medicine to help you relax. This information is not intended to replace advice given to you by your health care provider. Make sure you discuss any questions you have with your health care provider. Document Revised: 05/10/2018 Document Reviewed: 11/22/2016 Elsevier Patient Education  2020 [place] These instructions provide you with information about caring for yourself after your procedure. Your health care provider may also give you more specific instructions. Your treatment has been planned according to current medical practices, but problems sometimes occur. Call your health care provider if you have any problems or questions after your procedure. What can I expect after the procedure? After your procedure, you may:  Feel sleepy for several hours.  Feel clumsy and have poor balance for several hours.  Feel forgetful about what happened after the procedure.  Have poor judgment for several hours.  Feel nauseous or vomit.  Have a sore throat if you had a breathing tube during the procedure. Follow these instructions at home: For at least 24 hours after the procedure:      Have a responsible adult stay with you. It is important to have someone help care for you until you are awake and alert.  Rest as needed.  Do not: ? Participate in activities in which you could fall or become injured. ? Drive. ? Use heavy machinery. ? Drink alcohol. ? Take sleeping pills or medicines that cause drowsiness. ? Make important decisions or sign legal documents. ? Take care of children on your own. Eating and drinking  Follow the diet that is recommended by your health care provider.  If you vomit, drink water, juice, or soup when you can drink without vomiting.  Make sure you have little or no nausea before eating solid foods. General  instructions  Take over-the-counter and prescription medicines only as told by your health care provider.  If you have sleep apnea, surgery and certain medicines can increase your risk for breathing problems. Follow instructions from your health care provider about wearing your sleep device: ? Anytime you are sleeping, including during daytime naps. ? While taking prescription pain medicines, sleeping medicines, or medicines that make you drowsy.  If you smoke, do not smoke without supervision.  Keep all follow-up visits as told by your health care provider. This is important. Contact a health care provider if:  You keep feeling nauseous or you keep vomiting.  You feel light-headed.  You develop a rash.  You have a fever. Get help right away if:  You have trouble breathing. Summary  For several hours after your procedure, you  may feel sleepy and have poor judgment.  Have a responsible adult stay with you for at least 24 hours or until you are awake and alert. This information is not intended to replace advice given to you by your health care provider. Make sure you discuss any questions you have with your health care provider. Document Revised: 11/19/2017 Document Reviewed: 12/12/2015 Elsevier Patient Education  [place].

## 2019-11-14 ENCOUNTER — Other Ambulatory Visit: Payer: Self-pay

## 2019-11-14 ENCOUNTER — Encounter (HOSPITAL_COMMUNITY): Payer: Self-pay

## 2019-11-14 ENCOUNTER — Encounter (HOSPITAL_COMMUNITY)
Admission: RE | Admit: 2019-11-14 | Discharge: 2019-11-14 | Disposition: A | Discharge status: Home / Self Care | Payer: Medicare HMO | Source: Ambulatory Visit | Attending: Cardiovascular Disease | Admitting: Cardiovascular Disease

## 2019-11-14 ENCOUNTER — Other Ambulatory Visit (HOSPITAL_COMMUNITY)
Admission: RE | Admit: 2019-11-14 | Discharge: 2019-11-14 | Disposition: A | Discharge status: Home / Self Care | Payer: Medicare HMO | Source: Ambulatory Visit | Attending: Cardiovascular Disease | Admitting: Cardiovascular Disease

## 2019-11-14 DIAGNOSIS — Z20822 Contact with and (suspected) exposure to covid-19: Secondary | ICD-10-CM | POA: Insufficient documentation

## 2019-11-14 DIAGNOSIS — Z01812 Encounter for preprocedural laboratory examination: Secondary | ICD-10-CM | POA: Insufficient documentation

## 2019-11-14 LAB — BASIC METABOLIC PANEL
Anion gap: 10 (ref 5–15)
BUN: 24 mg/dL — ABNORMAL HIGH (ref 8–23)
CO2: 25 mmol/L (ref 22–32)
Calcium: 9.4 mg/dL (ref 8.9–10.3)
Chloride: 102 mmol/L (ref 98–111)
Creatinine, Ser: 0.64 mg/dL (ref 0.61–1.24)
GFR calc Af Amer: >60 mL/min (ref >60)
GFR calc non Af Amer: >60 mL/min (ref >60)
Glucose, Bld: 133 mg/dL — ABNORMAL HIGH (ref 70–99)
Potassium: 4.4 mmol/L (ref 3.5–5.1)
Sodium: 137 mmol/L (ref 135–145)

## 2019-11-15 LAB — SARS CORONAVIRUS 2 (TAT 6-24 HRS): SARS Coronavirus 2: NEGATIVE

## 2019-11-17 NOTE — Anesthesia Preprocedure Evaluation (Addendum)
Anesthesia Evaluation  Patient identified by MRN, date of birth, ID band Patient awake    Reviewed: Allergy & Precautions, NPO status , Patient's Chart, lab work & pertinent test results, reviewed documented beta blocker date and time   History of Anesthesia Complications Negative for: history of anesthetic complications  Airway Mallampati: II  TM Distance: >3 FB Neck ROM: Full    Dental  (+) Edentulous Upper, Edentulous Lower   Pulmonary former smoker,    Pulmonary exam normal breath sounds clear to auscultation       Cardiovascular Exercise Tolerance: Poor METS: walks with walker. hypertension, Pt. on medications and Pt. on home beta blockers + Peripheral Vascular Disease  Normal cardiovascular exam Rhythm:Regular Rate:Normal  08-Oct-2019 19:59:59 [facility]-NLD ROUTINE RECORD Sinus rhythm Probable left atrial enlargement Minimal ST elevation, anterior leads  1. The left ventricle has normal systolic function, with an ejection fraction of 55-60%. The cavity size was normal. There is mild concentric left ventricular hypertrophy. Left ventricular diastolic parameters were normal. No evidence of left ventricular regional wall motion abnormalities.  2. The right ventricle has normal systolic function. The cavity was normal. There is no increase in right ventricular wall thickness.  3. The aortic root is normal in size and structure.    Neuro/Psych IMPRESSION: Numerous small areas of abnormal brain enhancement - including faint enhancement at the indeterminate right temporal lobe area described yesterday - although all of these more resemble post ischemic and infarct related enhancement than cerebral metastatic disease.  And there are definite underlying acute and chronic infarcts scattered throughout the brain. However, a repeat brain MRI without and with contrast is recommended (e.g. 6 to 12 weeks from now)  to document expected post ischemic evolution of changes and exclude developing metastatic disease.   Electronically Signed   By: Genevie Ann M.D.   On: 10/10/2019 10:00   Neuromuscular disease CVA (stroke - one month ago, no residual symptoms from recent stroke), Residual Symptoms negative psych ROS   GI/Hepatic negative GI ROS, (+)     substance abuse  alcohol use,   Endo/Other  diabetes, Well Controlled, Type 2, Oral Hypoglycemic Agents, Insulin DependentHypothyroidism   Renal/GU negative Renal ROS     Musculoskeletal Cervical spinal stenosis    Abdominal   Peds  Hematology   Anesthesia Other Findings Pancreatic cancer   Reproductive/Obstetrics negative OB ROS                          Anesthesia Physical Anesthesia Plan  ASA: IV  Anesthesia Plan: General   Post-op Pain Management:    Induction: Intravenous  PONV Risk Score and Plan: 2 and Treatment may vary due to age or medical condition  Airway Management Planned: Natural Airway, Nasal Cannula and Simple Face Mask  Additional Equipment:   Intra-op Plan:   Post-operative Plan:   Informed Consent: I have reviewed the patients History and Physical, chart, labs and discussed the procedure including the risks, benefits and alternatives for the proposed anesthesia with the patient or authorized representative who has indicated his/her understanding and acceptance.       Plan Discussed with: CRNA  Anesthesia Plan Comments: (Risk of stroke and MI were explained.)       Anesthesia Quick Evaluation

## 2019-11-18 ENCOUNTER — Ambulatory Visit (HOSPITAL_COMMUNITY): Payer: Medicare HMO | Admitting: Anesthesiology

## 2019-11-18 ENCOUNTER — Ambulatory Visit (HOSPITAL_COMMUNITY)
Admission: RE | Admit: 2019-11-18 | Discharge: 2019-11-18 | Disposition: A | Discharge status: Home / Self Care | Payer: Medicare HMO | Attending: Cardiovascular Disease | Admitting: Cardiovascular Disease

## 2019-11-18 ENCOUNTER — Encounter (HOSPITAL_COMMUNITY): Payer: Self-pay | Admitting: Cardiovascular Disease

## 2019-11-18 ENCOUNTER — Other Ambulatory Visit (INDEPENDENT_AMBULATORY_CARE_PROVIDER_SITE_OTHER): Payer: Self-pay

## 2019-11-18 ENCOUNTER — Encounter (HOSPITAL_COMMUNITY)
Admission: RE | Disposition: A | Discharge status: Home / Self Care | Payer: Self-pay | Source: Home / Self Care | Attending: Cardiovascular Disease

## 2019-11-18 ENCOUNTER — Ambulatory Visit (HOSPITAL_BASED_OUTPATIENT_CLINIC_OR_DEPARTMENT_OTHER): Payer: Medicare HMO

## 2019-11-18 DIAGNOSIS — I639 Cerebral infarction, unspecified: Secondary | ICD-10-CM | POA: Diagnosis not present

## 2019-11-18 DIAGNOSIS — E1151 Type 2 diabetes mellitus with diabetic peripheral angiopathy without gangrene: Secondary | ICD-10-CM | POA: Diagnosis not present

## 2019-11-18 DIAGNOSIS — Z9221 Personal history of antineoplastic chemotherapy: Secondary | ICD-10-CM | POA: Diagnosis not present

## 2019-11-18 DIAGNOSIS — E1140 Type 2 diabetes mellitus with diabetic neuropathy, unspecified: Secondary | ICD-10-CM | POA: Diagnosis not present

## 2019-11-18 DIAGNOSIS — E785 Hyperlipidemia, unspecified: Secondary | ICD-10-CM | POA: Diagnosis not present

## 2019-11-18 DIAGNOSIS — I6389 Other cerebral infarction: Secondary | ICD-10-CM

## 2019-11-18 DIAGNOSIS — I10 Essential (primary) hypertension: Secondary | ICD-10-CM | POA: Insufficient documentation

## 2019-11-18 DIAGNOSIS — Z87891 Personal history of nicotine dependence: Secondary | ICD-10-CM | POA: Diagnosis not present

## 2019-11-18 DIAGNOSIS — Z8673 Personal history of transient ischemic attack (TIA), and cerebral infarction without residual deficits: Secondary | ICD-10-CM | POA: Insufficient documentation

## 2019-11-18 DIAGNOSIS — I739 Peripheral vascular disease, unspecified: Secondary | ICD-10-CM | POA: Diagnosis not present

## 2019-11-18 DIAGNOSIS — E119 Type 2 diabetes mellitus without complications: Secondary | ICD-10-CM | POA: Diagnosis not present

## 2019-11-18 DIAGNOSIS — D6869 Other thrombophilia: Secondary | ICD-10-CM | POA: Diagnosis not present

## 2019-11-18 DIAGNOSIS — Z79899 Other long term (current) drug therapy: Secondary | ICD-10-CM | POA: Diagnosis not present

## 2019-11-18 DIAGNOSIS — E039 Hypothyroidism, unspecified: Secondary | ICD-10-CM | POA: Insufficient documentation

## 2019-11-18 DIAGNOSIS — Z8507 Personal history of malignant neoplasm of pancreas: Secondary | ICD-10-CM | POA: Insufficient documentation

## 2019-11-18 DIAGNOSIS — R079 Chest pain, unspecified: Secondary | ICD-10-CM | POA: Insufficient documentation

## 2019-11-18 DIAGNOSIS — Z7902 Long term (current) use of antithrombotics/antiplatelets: Secondary | ICD-10-CM | POA: Diagnosis not present

## 2019-11-18 DIAGNOSIS — Z823 Family history of stroke: Secondary | ICD-10-CM | POA: Diagnosis not present

## 2019-11-18 DIAGNOSIS — Z794 Long term (current) use of insulin: Secondary | ICD-10-CM | POA: Diagnosis not present

## 2019-11-18 DIAGNOSIS — I6523 Occlusion and stenosis of bilateral carotid arteries: Secondary | ICD-10-CM | POA: Insufficient documentation

## 2019-11-18 DIAGNOSIS — Z79890 Hormone replacement therapy (postmenopausal): Secondary | ICD-10-CM | POA: Diagnosis not present

## 2019-11-18 HISTORY — PX: BUBBLE STUDY: SHX6837

## 2019-11-18 HISTORY — PX: TEE WITHOUT CARDIOVERSION: SHX5443

## 2019-11-18 LAB — GLUCOSE, CAPILLARY: Glucose-Capillary: 172 mg/dL — ABNORMAL HIGH (ref 70–99)

## 2019-11-18 SURGERY — ECHOCARDIOGRAM, TRANSESOPHAGEAL
Anesthesia: General

## 2019-11-18 MED ORDER — SODIUM CHLORIDE 0.9 % IV SOLN: INTRAVENOUS | Status: DC

## 2019-11-18 MED ORDER — KETAMINE HCL 50 MG/5ML IJ SOSY
PREFILLED_SYRINGE | INTRAMUSCULAR | Status: AC
  Filled 2019-11-18: qty 5

## 2019-11-18 MED ORDER — BUTAMBEN-TETRACAINE-BENZOCAINE 2-2-14 % EX AERO
INHALATION_SPRAY | CUTANEOUS | Status: AC
  Filled 2019-11-18: qty 5

## 2019-11-18 MED ORDER — PROPOFOL 10 MG/ML IV BOLUS
INTRAVENOUS | Status: DC | PRN
  Administered 2019-11-18: 20 mg via INTRAVENOUS
  Administered 2019-11-18: 30 mg via INTRAVENOUS

## 2019-11-18 MED ORDER — PROPOFOL 500 MG/50ML IV EMUL
INTRAVENOUS | Status: DC | PRN
  Administered 2019-11-18: 50 ug/kg/min via INTRAVENOUS

## 2019-11-18 MED ORDER — LACTATED RINGERS IV SOLN: Freq: Once | INTRAVENOUS | Status: AC

## 2019-11-18 MED ORDER — LACTATED RINGERS IV SOLN: INTRAVENOUS | Status: DC | PRN

## 2019-11-18 MED ORDER — ATORVASTATIN CALCIUM 80 MG PO TABS: ORAL_TABLET | ORAL | 0 refills | Status: DC

## 2019-11-18 MED ORDER — PROCHLORPERAZINE MALEATE 10 MG PO TABS: 10.0000 mg | ORAL_TABLET | Freq: Four times a day (QID) | ORAL | 3 refills | Status: DC | PRN

## 2019-11-18 MED ORDER — KETAMINE HCL 10 MG/ML IJ SOLN
INTRAMUSCULAR | Status: DC | PRN
  Administered 2019-11-18: 20 mg via INTRAVENOUS

## 2019-11-18 NOTE — Discharge Instructions (Signed)
PATIENT INSTRUCTIONS POST-ANESTHESIA  IMMEDIATELY FOLLOWING SURGERY:  Do not drive or operate machinery for the first twenty four hours after surgery.  Do not make any important decisions for twenty four hours after surgery or while taking narcotic pain medications or sedatives.  If you develop intractable nausea and vomiting or a severe headache please notify your doctor immediately.  FOLLOW-UP:  Please make an appointment with your surgeon as instructed. You do not need to follow up with anesthesia unless specifically instructed to do so.  WOUND CARE INSTRUCTIONS (if applicable):  Keep a dry clean dressing on the anesthesia/puncture wound site if there is drainage.  Once the wound has quit draining you may leave it open to air.  Generally you should leave the bandage intact for twenty four hours unless there is drainage.  If the epidural site drains for more than 36-48 hours please call the anesthesia department.  QUESTIONS?:  Please feel free to call your physician or the hospital operator if you have any questions, and they will be happy to assist you.       RESUME YOUR MEDICATIONS AS NORMAL PER DR. Jacinta Shoe

## 2019-11-18 NOTE — Transfer of Care (Signed)
Immediate Anesthesia Transfer of Care Note  Patient: Mark Foster  Procedure(s) Performed: TRANSESOPHAGEAL ECHOCARDIOGRAM (TEE) WITH PROPOFOL (N/A )  Patient Location: PACU  Anesthesia Type:General  Level of Consciousness: sedated and patient cooperative  Airway & Oxygen Therapy: Patient Spontanous Breathing and Patient connected to nasal cannula oxygen  Post-op Assessment: Report given to RN and Post -op Vital signs reviewed and stable  Post vital signs: Reviewed and stable  Last Vitals:  Vitals Value Taken Time  BP    Temp    Pulse    Resp    SpO2      Last Pain:  Vitals:   11/18/19 0933  TempSrc: Oral  PainSc: 0-No pain         Complications: No apparent anesthesia complications  See pacu flow sheet for vital signs

## 2019-11-18 NOTE — Progress Notes (Signed)
*  PRELIMINARY RESULTS* Echocardiogram Echocardiogram Transesophageal with bubble study has been performed.  Leavy Cella 11/18/2019, 12:33 PM

## 2019-11-18 NOTE — Interval H&P Note (Signed)
History and Physical Interval Note: No changes. Proceed with TEE as planned.  11/18/2019 9:22 AM  Mark Foster  has presented today for surgery, with the diagnosis of cva.  The various methods of treatment have been discussed with the patient and family. After consideration of risks, benefits and other options for treatment, the patient has consented to  Procedure(s): TRANSESOPHAGEAL ECHOCARDIOGRAM (TEE) WITH PROPOFOL (N/A) as a surgical intervention.  The patient's history has been reviewed, patient examined, no change in status, stable for surgery.  I have reviewed the patient's chart and labs.  Questions were answered to the patient's satisfaction.     Mark Foster

## 2019-11-18 NOTE — Anesthesia Postprocedure Evaluation (Signed)
Anesthesia Post Note  Patient: Mark Foster  Procedure(s) Performed: TRANSESOPHAGEAL ECHOCARDIOGRAM (TEE) WITH PROPOFOL (N/A ) BUBBLE STUDY (N/A )  Patient location during evaluation: PACU Anesthesia Type: General Level of consciousness: awake and alert and patient cooperative Pain management: satisfactory to patient Vital Signs Assessment: post-procedure vital signs reviewed and stable Respiratory status: spontaneous breathing Cardiovascular status: stable Postop Assessment: no apparent nausea or vomiting Anesthetic complications: no     Last Vitals:  Vitals:   11/18/19 1145 11/18/19 1206  BP:  118/64  Pulse: 79 76  Resp: 14 16  Temp:  36.6 C  SpO2: 100% 97%    Last Pain:  Vitals:   11/18/19 1206  TempSrc: Oral  PainSc: 0-No pain                 Toniyah Dilmore

## 2019-11-25 ENCOUNTER — Ambulatory Visit: Payer: Medicare HMO | Admitting: "Endocrinology

## 2019-11-25 ENCOUNTER — Other Ambulatory Visit: Payer: Self-pay

## 2019-11-25 ENCOUNTER — Encounter: Payer: Self-pay | Admitting: "Endocrinology

## 2019-11-25 VITALS — BP 127/76 | HR 83 | Ht 70.0 in | Wt 159.0 lb

## 2019-11-25 DIAGNOSIS — E039 Hypothyroidism, unspecified: Secondary | ICD-10-CM

## 2019-11-25 DIAGNOSIS — E1165 Type 2 diabetes mellitus with hyperglycemia: Secondary | ICD-10-CM

## 2019-11-25 DIAGNOSIS — I10 Essential (primary) hypertension: Secondary | ICD-10-CM

## 2019-11-25 DIAGNOSIS — E782 Mixed hyperlipidemia: Secondary | ICD-10-CM

## 2019-11-25 MED ORDER — INSULIN LISPRO (1 UNIT DIAL) 100 UNIT/ML (KWIKPEN): 6.0000 [IU] | PEN_INJECTOR | Freq: Three times a day (TID) | SUBCUTANEOUS | 0 refills | Status: DC

## 2019-11-25 NOTE — Progress Notes (Signed)
11/25/2019, 12:35 PM       Endocrinology follow-up note   Subjective:    Patient ID: Mark Foster, male    DOB: [birth year].  Mark Foster is being seen in follow-up for  management of currently uncontrolled symptomatic pancreatic diabetes, hypothyroidism, hyperlipidemia. PMD:  Ailene Ards, NP.   Past Medical History:  Diagnosis Date  . Cervical compression fracture ([place])   . Cervical spinal stenosis   . Diabetic neuropathy ([facility])    Bilateral legs  . Diverticulitis   . Family history of pancreatic cancer   . History of kidney stones   . Hypothyroidism   . Pancreatic cancer ([place])   . Stenosis of left vertebral artery   . 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 Stroke [facility])    2011  . Type 2 diabetes mellitus ([place])    Past Surgical History:  Procedure Laterality Date  . APPENDECTOMY    . BUBBLE STUDY N/A 11/18/2019   Procedure: BUBBLE STUDY;  Surgeon: Herminio Commons, MD;  Location: [facility];  Service: Cardiology;  Laterality: N/A;  . COLON RESECTION     For diverticulitis  . COLON SURGERY    . ESOPHAGOGASTRODUODENOSCOPY N/A 10/03/2018   Procedure: ESOPHAGOGASTRODUODENOSCOPY (EGD);  Surgeon: [name], MD;  Location: [name];  Service: Endoscopy;  Laterality: N/A;  . EUS N/A 10/03/2018   Procedure: UPPER ENDOSCOPIC ULTRASOUND (EUS) RADIAL;  Surgeon: [name], MD;  Location: [facility];  Service: Endoscopy;  Laterality: N/A;  . FINE NEEDLE ASPIRATION N/A 10/03/2018   Procedure: FINE NEEDLE ASPIRATION (FNA) LINEAR;  Surgeon: [name], MD;  Location: [facility];  Service: Endoscopy;  Laterality: N/A;  . IR ANGIO INTRA EXTRACRAN SEL COM CAROTID INNOMINATE BILAT MOD SED  01/21/2018  . IR ANGIO INTRA EXTRACRAN SEL COM CAROTID INNOMINATE UNI R MOD SED  03/21/2018  . IR ANGIO VERTEBRAL SEL VERTEBRAL BILAT MOD SED  01/21/2018  . IR TRANSCATH EXCRAN VERT OR CAR A STENT   03/21/2018  . PORTACATH PLACEMENT Left 12/23/2018   Procedure: INSERTION PORT-A-CATH;  Surgeon: Virl Cagey, MD;  Location: [facility];  Service: General;  Laterality: Left;  . RADIOLOGY WITH ANESTHESIA N/A 03/21/2018   Procedure: IR WITH ANESTHESIA WITH STENT PLACEMENT;  Surgeon: Luanne Bras, MD;  Location: [facility];  Service: Radiology;  Laterality: N/A;  . ROTATOR CUFF REPAIR     Left  . TEE WITHOUT CARDIOVERSION N/A 11/18/2019   Procedure: TRANSESOPHAGEAL ECHOCARDIOGRAM (TEE) WITH PROPOFOL;  Surgeon: Herminio Commons, MD;  Location: [facility];  Service: Cardiology;  Laterality: N/A;   Social History   Socioeconomic History  . Marital status: Single    Spouse name: Not on file  . Number of children: 2  . Years of education: Not on file  . Highest education level: Not on file  Occupational History  . Occupation: Estate agent  Tobacco Use  . Smoking status: Former Smoker    Start date: 10/06/2019  . Smokeless tobacco: Never Used  Substance and Sexual Activity  . Alcohol use: Yes    Alcohol/week: 1.0 standard drinks    Types: 1 Cans  of beer per week    Comment: rare   . Drug use: Never  . Sexual activity: Not on file  Other Topics Concern  . Not on file  Social History Narrative  . Not on file   Social Determinants of Health   Financial Resource Strain: Medium Risk  . Difficulty of Paying Living Expenses: Somewhat hard  Food Insecurity: No Food Insecurity  . Worried About Charity fundraiser in the Last Year: Never true  . Ran Out of Food in the Last Year: Never true  Transportation Needs: No Transportation Needs  . Lack of Transportation (Medical): No  . Lack of Transportation (Non-Medical): No  Physical Activity: Inactive  . Days of Exercise per Week: 0 days  . Minutes of Exercise per Session: 0 min  Stress: Stress Concern Present  . Feeling of Stress : To some extent  Social Connections: Somewhat Isolated  . Frequency of Communication with Friends and  Family: More than three times a week  . Frequency of Social Gatherings with Friends and Family: Once a week  . Attends Religious Services: More than 4 times per year  . Active Member of Clubs or Organizations: No  . Attends Archivist Meetings: Never  . Marital Status: Divorced   Outpatient Encounter Medications as of 11/25/2019  Medication Sig  . atorvastatin (LIPITOR) 80 MG tablet TAKE 1 TABLET(80 MG) BY MOUTH DAILY AT 6 PM  . clopidogrel (PLAVIX) 75 MG tablet Take 1 tablet (75 mg total) by mouth daily.  Marland Kitchen ECHINACEA EXTRACT PO Take 2 tablets by mouth daily.   Marland Kitchen gabapentin (NEURONTIN) 300 MG capsule Take 1 capsule (300 mg total) by mouth 2 (two) times daily.  . insulin glargine (LANTUS SOLOSTAR) 100 UNIT/ML Solostar Pen Inject 36 Units into the skin at bedtime.  . insulin lispro (HUMALOG) 100 UNIT/ML KwikPen Inject 0.06-0.11 mLs (6-11 Units total) into the skin 3 (three) times daily before meals.  . Insulin Pen Needle 32G X 4 MM MISC 1 Device by Does not apply route daily.  Marland Kitchen lidocaine-prilocaine (EMLA) cream Apply 1 application topically daily as needed (prior to port being accessed (~every 3 months)).  Marland Kitchen loperamide (IMODIUM A-D) 2 MG tablet Take 1 tablet (2 mg total) by mouth as needed. Take 2 at diarrhea onset , then 1 every 2hr until 12hrs with no BM. May take 2 every 4hrs at night. If diarrhea recurs repeat. (Patient taking differently: Take 2-4 mg by mouth 4 (four) times daily as needed for diarrhea or loose stools. )  . meclizine (ANTIVERT) 25 MG tablet Take 1 tablet (25 mg total) by mouth 3 (three) times daily as needed for dizziness.  . metoprolol tartrate (LOPRESSOR) 100 MG tablet Take 1 tablet (100 mg) 2 hrs before CT  . NP THYROID 30 MG tablet Take 30 mg by mouth daily before breakfast.   . Omega-3 1000 MG CAPS Take 1,000 mg by mouth daily.  . ondansetron (ZOFRAN) 8 MG tablet Take 0.5 tablets (4 mg total) by mouth every 6 (six) hours as needed for nausea or vomiting.  .  pantoprazole (PROTONIX) 20 MG tablet Take 20 mg by mouth daily as needed (heartburn/indigestion.).   Marland Kitchen prochlorperazine (COMPAZINE) 10 MG tablet Take 1 tablet (10 mg total) by mouth every 6 (six) hours as needed for nausea or vomiting.  . sertraline (ZOLOFT) 50 MG tablet TAKE 1 TABLET BY MOUTH DAILY (Patient taking differently: Take 50 mg by mouth daily. )  . tamsulosin (FLOMAX) 0.4 MG  CAPS capsule Take 1 capsule (0.4 mg total) by mouth daily.  . traMADol (ULTRAM) 50 MG tablet Take 1 tablet (50 mg total) by mouth every 6 (six) hours as needed. (Patient taking differently: Take 50 mg by mouth every 6 (six) hours as needed (pain.). )  . vitamin B-12 (CYANOCOBALAMIN) 1000 MCG tablet Take 1,000 mcg by mouth daily.  . [DISCONTINUED] atorvastatin (LIPITOR) 80 MG tablet Take 1 tablet (80 mg total) by mouth daily at 6 PM.  . [DISCONTINUED] insulin lispro (HUMALOG) 100 UNIT/ML KwikPen Inject 0.05 mLs (5 Units total) into the skin 3 (three) times daily before meals.   Facility-Administered Encounter Medications as of 11/25/2019  Medication  . 0.9 %  sodium chloride infusion  . 0.9 %  sodium chloride infusion  . dextrose 5 % solution  . heparin lock flush 100 unit/mL  . sodium chloride flush (NS) 0.9 % injection 10 mL  . sodium chloride flush (NS) 0.9 % injection 10 mL    ALLERGIES: Allergies  Allergen Reactions  . 5-Alpha Reductase Inhibitors   . Demerol [Meperidine Hcl] Other (See Comments)    convulsions    VACCINATION STATUS: Immunization History  Administered Date(s) Administered  . HiB (PRP-T) 10/23/2018  . Influenza,inj,Quad PF,6+ Mos 06/19/2019  . Influenza-Unspecified 08/02/2018  . Meningococcal Mcv4o 10/23/2018  . Pneumococcal Conjugate-13 10/23/2018    Diabetes He presents for his follow-up diabetic visit. He has type 1 diabetes mellitus. Onset time: He was diagnosed at approximate age of 67 years. His disease course has been improving (He did have A1c of 9.7% in May 2019.   However, more recently he has documented controlled glycemic profile averaging 134 over the last 2 weeks.). There are no hypoglycemic associated symptoms. Pertinent negatives for hypoglycemia include no confusion, headaches, pallor or seizures. Associated symptoms include polydipsia and polyuria. Pertinent negatives for diabetes include no chest pain, no fatigue, no polyphagia and no weakness. There are no hypoglycemic complications. Symptoms are improving. Diabetic complications include a CVA. Pertinent negatives for diabetic complications include no heart disease. (Awaiting stent placement in his carotid artery.) Risk factors for coronary artery disease include diabetes mellitus, dyslipidemia, hypertension, male sex and sedentary lifestyle. Current diabetic treatment includes insulin injections. His weight is fluctuating minimally. He is following a generally unhealthy diet. When asked about meal planning, he reported none. He has not had a previous visit with a dietitian. He rarely participates in exercise. His home blood glucose trend is increasing steadily. His breakfast blood glucose range is generally 140-180 mg/dl. His lunch blood glucose range is generally 180-200 mg/dl. His dinner blood glucose range is generally 180-200 mg/dl. His bedtime blood glucose range is generally 180-200 mg/dl. His overall blood glucose range is 180-200 mg/dl. (He presents with controlled fasting glycemic profile to near target levels, slightly above target postprandial blood glucose readings.  His most recent A1c was 12.1%.   ) An ACE inhibitor/angiotensin II receptor blocker is not being taken.  Hyperlipidemia This is a chronic problem. The current episode started more than 1 year ago. The problem is uncontrolled. Exacerbating diseases include diabetes. Pertinent negatives include no chest pain, myalgias or shortness of breath. Current antihyperlipidemic treatment includes statins. Risk factors for coronary artery disease  include diabetes mellitus, dyslipidemia, hypertension, male sex and a sedentary lifestyle.   Review of systems: Limited as above   Objective:    BP 127/76   Pulse 83   Ht 5\' 10"  (1.778 m)   Wt 159 lb (72.1 kg)   BMI 22.81  kg/m   Wt Readings from Last 3 Encounters:  11/25/19 159 lb (72.1 kg)  11/18/19 149 lb 14.6 oz (68 kg)  11/14/19 150 lb (68 kg)      CMP ( most recent) CMP     Component Value Date/Time   NA 137 11/14/2019 1036   K 4.4 11/14/2019 1036   CL 102 11/14/2019 1036   CO2 25 11/14/2019 1036   GLUCOSE 133 (H) 11/14/2019 1036   BUN 24 (H) 11/14/2019 1036   CREATININE 0.64 11/14/2019 1036   CREATININE 0.81 09/30/2019 1113   CALCIUM 9.4 11/14/2019 1036   PROT 6.4 (L) 10/08/2019 2009   ALBUMIN 3.7 10/08/2019 2009   AST 18 10/08/2019 2009   AST 16 12/11/2018 1018   ALT 14 10/08/2019 2009   ALT 19 12/11/2018 1018   ALKPHOS 56 10/08/2019 2009   BILITOT 0.8 10/08/2019 2009   BILITOT 0.8 12/11/2018 1018   GFRNONAA >60 11/14/2019 1036   GFRNONAA 94 09/30/2019 1113   GFRAA >60 11/14/2019 1036   GFRAA 109 09/30/2019 1113     Diabetic Labs (most recent): Lab Results  Component Value Date   HGBA1C 12.1 (H) 10/08/2019   HGBA1C 12.6 (H) 09/30/2019   HGBA1C 10.8 (H) 02/25/2019     Lipid Panel ( most recent) Lipid Panel     Component Value Date/Time   CHOL 186 10/09/2019 0159   TRIG 105 10/09/2019 0159   HDL 42 10/09/2019 0159   CHOLHDL 4.4 10/09/2019 0159   VLDL 21 10/09/2019 0159   LDLCALC 123 (H) 10/09/2019 0159    Assessment & Plan:   1. Uncontrolled type 2 diabetes mellitus with hyperglycemia (Mark Foster)  - Mark Foster has history of uncontrolled diabetes . He presents with controlled fasting glycemic profile to near target levels, slightly above target postprandial blood glucose readings.  His most recent A1c was 12.1%.     He reports that his fasting blood glucose readings are above 200 mg per DL.    -  Recent labs reviewed.  -his diabetes is  likely pancreatic diabetes given his history of heavy alcohol use in the past.  Administrative reclassify his diabetes type I for management purposes. -He is advised to introduce more carbohydrates to his diet.  He would benefit from some weight gain.  -He is not a suitable candidate for oral antidiabetic medications nor incretin therapy, hence he is advised to discontinue glipizide. -Given his pancreatic diabetes, even before he was diagnosed with pancreatic cancer, the best choice of therapy for his diabetes will be exclusively with insulin. -He is advised to continue Lantus 36 units nightly, discussed and increased NovoLog slightly to 6 units 3 times daily AC for Premeal blood glucose readings between 90-150 mg per DL, and specific correction dose for readings above 150 mg per DL.  He agrees to continue to monitor blood glucose 4 times a day-before meals and at bedtime.    He will benefit from a CGM device, will be considered for freestyle libre device on his next visit in 10 days.    -Patient is encouraged to call clinic for blood glucose levels less than 70 or above 300 mg /dl.  - Patient specific target  A1c;  LDL, HDL, Triglycerides, and  Waist Circumference were discussed in detail. -He is anti-GAD antibodies, anti-islet cell antibodies are negative indicating his diabetes is not likely to be type I, likely related to his prior alcohol abuse causing pancreatic diabetes.  -The priority in this patient would be to avoid  hypoglycemia due to lack of alpha cell  glucagon response on the background of alcohol induced diabetes.  2) Lipids/HPL:   His recent labs show LDL of 123.  He is advised to continue Lipitor 40 mg p.o. nightly.     3) hypertension/blood pressure: His blood pressure is controlled to target.  He is on metoprolol 100 mg p.o. daily.  4)  Weight/Diet: He has gained 10+ pounds since last visit, a good development for him.  He is not a candidate for weight loss.  He is encouraged to  continue to introduce complex carbohydrates.  Diet, exercise, and detailed carbohydrates information provided.  5) hypothyroidism -He is currently on NP thyroid 30 mg daily.  His thyroid function test in January 2021 were consistent with appropriate replacement.  He is advised to continue on his current dose for now.     - We discussed about the correct intake of his thyroid hormone, on empty stomach at fasting, with water, separated by at least 30 minutes from breakfast and other medications,  and separated by more than 4 hours from calcium, iron, multivitamins, acid reflux medications (PPIs). -Patient is made aware of the fact that thyroid hormone replacement is needed for life, dose to be adjusted by periodic monitoring of thyroid function tests.   6) Chronic Care/Health Maintenance:  -he  is on Statin medications and  is encouraged to continue to follow up with Ophthalmology, Dentist,  Podiatrist at least yearly or according to recommendations, and advised to  stay away from smoking. I have recommended yearly flu vaccine and pneumonia vaccination at least every 5 years;  and  sleep for at least 7 hours a day.  - I advised patient to maintain close follow up with Ailene Ards, NP for primary care needs.  - Time spent on this patient care encounter:  35 min, of which > 50% was spent in  counseling and the rest reviewing his blood glucose logs , discussing his hypoglycemia and hyperglycemia episodes, reviewing his current and  previous labs / studies  ( including abstraction from other facilities) and medications  doses and developing a  long term treatment plan and documenting his care.   Please refer to Patient Instructions for Blood Glucose Monitoring and Insulin/Medications Dosing Guide"  in media tab for additional information. Please  also refer to " Patient Self Inventory" in the Media  tab for reviewed elements of pertinent patient history.  Mark Foster participated in the discussions,  expressed understanding, and voiced agreement with the above plans.  All questions were answered to his satisfaction. he is encouraged to contact clinic should he have any questions or concerns prior to his return visit.   Follow up plan: - Return in about 9 weeks (around 01/27/2020) for Bring Meter and Logs- A1c in Office.  Glade Lloyd, MD [facility] [address] [place], [place] [address] Phone: [phone]  Fax: [phone]    11/25/2019, 12:35 PM  This note was partially dictated with voice recognition software. Similar sounding words can be transcribed inadequately or may not  be corrected upon review.

## 2019-12-02 ENCOUNTER — Encounter (HOSPITAL_COMMUNITY): Payer: Self-pay

## 2019-12-02 ENCOUNTER — Other Ambulatory Visit: Payer: Self-pay

## 2019-12-02 ENCOUNTER — Ambulatory Visit (HOSPITAL_COMMUNITY)
Admission: RE | Admit: 2019-12-02 | Discharge: 2019-12-02 | Disposition: A | Discharge status: Home / Self Care | Payer: Medicare HMO | Source: Ambulatory Visit | Attending: Interventional Radiology | Admitting: Interventional Radiology

## 2019-12-02 DIAGNOSIS — I639 Cerebral infarction, unspecified: Secondary | ICD-10-CM

## 2019-12-03 ENCOUNTER — Telehealth: Payer: Self-pay

## 2019-12-03 MED ORDER — INSULIN ASPART 100 UNIT/ML FLEXPEN: 6.0000 [IU] | PEN_INJECTOR | Freq: Three times a day (TID) | SUBCUTANEOUS | 0 refills | Status: DC

## 2019-12-03 NOTE — Telephone Encounter (Signed)
Please send in Novolog to [facility]

## 2019-12-03 NOTE — Telephone Encounter (Signed)
Rx refill sent to [facility]. 

## 2019-12-11 ENCOUNTER — Other Ambulatory Visit: Payer: Self-pay

## 2019-12-11 ENCOUNTER — Encounter: Payer: Self-pay | Admitting: Neurology

## 2019-12-11 ENCOUNTER — Ambulatory Visit (INDEPENDENT_AMBULATORY_CARE_PROVIDER_SITE_OTHER): Payer: Medicare HMO | Admitting: Neurology

## 2019-12-11 VITALS — BP 122/73 | HR 88 | Temp 97.8°F | Ht 72.0 in | Wt 160.6 lb

## 2019-12-11 DIAGNOSIS — G40209 Localization-related (focal) (partial) symptomatic epilepsy and epileptic syndromes with complex partial seizures, not intractable, without status epilepticus: Secondary | ICD-10-CM | POA: Diagnosis not present

## 2019-12-11 DIAGNOSIS — I639 Cerebral infarction, unspecified: Secondary | ICD-10-CM

## 2019-12-11 DIAGNOSIS — R55 Syncope and collapse: Secondary | ICD-10-CM

## 2019-12-11 MED ORDER — LEVETIRACETAM ER 500 MG PO TB24: 500.0000 mg | ORAL_TABLET | Freq: Every day | ORAL | Status: DC

## 2019-12-11 NOTE — Patient Instructions (Addendum)
I had a long discussion with the patient with regards to her recent episodes of passing out followed by some slurred speech and disorientation being of unclear etiology possibly syncope versus seizure given recent subacute infarcts on the MRI.  I recommend repeat EEG and trial of Keppra XR 500 mg daily.  He will follow up results of his 30-day heart monitor and continue Plavix for stroke prevention for now.  Maintain aggressive risk factor modification with strict control of diabetes with hemoglobin A1c goal below 6.5, hypertension with blood pressure goal below 130/90 and lipids with LDL cholesterol goal below 70 mg percent.  He was advised to eat a healthy diet and be active.  He was also advised not to drive as per [employer] law for the next 3 to 6 months.  He was also advised to keep his appointment for follow-up MRI scan of the brain with and without contrast.  He will return for follow-up in the future with my nurse practitioner Janett Billow in 3 months or call earlier if necessary.  Stroke Prevention Some medical conditions and behaviors are associated with a higher chance of having a stroke. You can help prevent a stroke by making nutrition, lifestyle, and other changes, including managing any medical conditions you may have. What nutrition changes can be made?   Eat healthy foods. You can do this by: ? Choosing foods high in fiber, such as fresh fruits and vegetables and whole grains. ? Eating at least 5 or more servings of fruits and vegetables a day. Try to fill half of your plate at each meal with fruits and vegetables. ? Choosing lean protein foods, such as lean cuts of meat, poultry without skin, fish, tofu, beans, and nuts. ? Eating low-fat dairy products. ? Avoiding foods that are high in salt (sodium). This can help lower blood pressure. ? Avoiding foods that have saturated fat, trans fat, and cholesterol. This can help prevent high cholesterol. ? Avoiding processed and premade  foods.  Follow your health care provider's specific guidelines for losing weight, controlling high blood pressure (hypertension), lowering high cholesterol, and managing diabetes. These may include: ? Reducing your daily calorie intake. ? Limiting your daily sodium intake to 1,500 milligrams (mg). ? Using only healthy fats for cooking, such as olive oil, canola oil, or sunflower oil. ? Counting your daily carbohydrate intake. What lifestyle changes can be made?  Maintain a healthy weight. Talk to your health care provider about your ideal weight.  Get at least 30 minutes of moderate physical activity at least 5 days a week. Moderate activity includes brisk walking, biking, and swimming.  Do not use any products that contain nicotine or tobacco, such as cigarettes and e-cigarettes. If you need help quitting, ask your health care provider. It may also be helpful to avoid exposure to secondhand smoke.  Limit alcohol intake to no more than 1 drink a day for nonpregnant women and 2 drinks a day for men. One drink equals 12 oz of beer, 5 oz of wine, or 1 oz of hard liquor.  Stop any illegal drug use.  Avoid taking birth control pills. Talk to your health care provider about the risks of taking birth control pills if: ? You are over 74 years old. ? You smoke. ? You get migraines. ? You have ever had a blood clot. What other changes can be made?  Manage your cholesterol levels. ? Eating a healthy diet is important for preventing high cholesterol. If cholesterol cannot be managed  through diet alone, you may also need to take medicines. ? Take any prescribed medicines to control your cholesterol as told by your health care provider.  Manage your diabetes. ? Eating a healthy diet and exercising regularly are important parts of managing your blood sugar. If your blood sugar cannot be managed through diet and exercise, you may need to take medicines. ? Take any prescribed medicines to control  your diabetes as told by your health care provider.  Control your hypertension. ? To reduce your risk of stroke, try to keep your blood pressure below 130/80. ? Eating a healthy diet and exercising regularly are an important part of controlling your blood pressure. If your blood pressure cannot be managed through diet and exercise, you may need to take medicines. ? Take any prescribed medicines to control hypertension as told by your health care provider. ? Ask your health care provider if you should monitor your blood pressure at home. ? Have your blood pressure checked every year, even if your blood pressure is normal. Blood pressure increases with age and some medical conditions.  Get evaluated for sleep disorders (sleep apnea). Talk to your health care provider about getting a sleep evaluation if you snore a lot or have excessive sleepiness.  Take over-the-counter and prescription medicines only as told by your health care provider. Aspirin or blood thinners (antiplatelets or anticoagulants) may be recommended to reduce your risk of forming blood clots that can lead to stroke.  Make sure that any other medical conditions you have, such as atrial fibrillation or atherosclerosis, are managed. What are the warning signs of a stroke? The warning signs of a stroke can be easily remembered as BEFAST.  B is for balance. Signs include: ? Dizziness. ? Loss of balance or coordination. ? Sudden trouble walking.  E is for eyes. Signs include: ? A sudden change in vision. ? Trouble seeing.  F is for face. Signs include: ? Sudden weakness or numbness of the face. ? The face or eyelid drooping to one side.  A is for arms. Signs include: ? Sudden weakness or numbness of the arm, usually on one side of the body.  S is for speech. Signs include: ? Trouble speaking (aphasia). ? Trouble understanding.  T is for time. ? These symptoms may represent a serious problem that is an emergency. Do not  wait to see if the symptoms will go away. Get medical help right away. Call your local emergency services (911 in the [place] Do not drive yourself to the hospital.  Other signs of stroke may include: ? A sudden, severe headache with no known cause. ? Nausea or vomiting. ? Seizure. Where to find more information For more information, visit:  American Stroke Association: www.strokeassociation.org  National Stroke Association: www.stroke.org Summary  You can prevent a stroke by eating healthy, exercising, not smoking, limiting alcohol intake, and managing any medical conditions you may have.  Do not use any products that contain nicotine or tobacco, such as cigarettes and e-cigarettes. If you need help quitting, ask your health care provider. It may also be helpful to avoid exposure to secondhand smoke.  Remember BEFAST for warning signs of stroke. Get help right away if you or a loved one has any of these signs. This information is not intended to replace advice given to you by your health care provider. Make sure you discuss any questions you have with your health care provider. Document Revised: 08/03/2017 Document Reviewed: 09/26/2016 Elsevier Patient Education  [phone]  [employer].

## 2019-12-11 NOTE — Progress Notes (Signed)
[facility] [address] [place]. [place] [address] [phone]       OFFICE CONSULT NOTE  Mr. Mark Foster Date of Birth:  Nov 15, 1955 Medical Record Number:  [id number]   Referring MD: Minus Breeding Reason for Referral: Stroke HPI: Mark Foster is a 64 year old Caucasian male with past medical history of diabetes, vitamin D deficiency, pancreatic cancer s/p chemotherapy, hypothyroidism, diabetic neuropathy stroke in 2011 and hypertension who presented to [facility] on 10/09/2019 with episode of brief unresponsiveness.  Patient states that he had gone to a musical open mic outdoor event with a friend.  He remembers keeping his guitar down and going to the bar and getting a beer for himself and his friend.  He lost consciousness and the next thing he remembers was waking up in the ambulance.  Apparently there was no witnessed tonic-clonic activity tongue bite.  The patient was little confused and had some slurred speech when he woke up.  The symptoms resolved by the time he reached the hospital.  He had an MRI scan of the brain done on 10/10/2019 which I personally reviewed shows tiny enhancing areas of enhancement in the right temporal lobe likely subacute infarcts.  Metastasis would be less likely.  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  He was in fact followed in office and had a strokelike episode on 01/19/2018 which was treated with IV TPA and showed clinical improvement at that time the MRI had shown remote age left right MCA infarct which patient was not aware of.  He had stent assisted angioplasty of left vertebral artery stenosis on 03/21/2018 by Dr. Estanislado Pandy.  He has no prior history so history of seizures in the form of loss of consciousness, tongue biting or injury.  He denies any headache, slurred speech, gait or balance problems.  ROS:   14 system review of systems is positive for passing out, slurred  speech, confusion, dizziness, gait ataxia, numbness in the feet and all other systems negative PMH:  Past Medical History:  Diagnosis Date  . Cervical compression fracture ([place])   . Cervical spinal stenosis   . Diabetic neuropathy ([facility])    Bilateral legs  . Diverticulitis   . Family history of pancreatic cancer   . History of kidney stones   . Hypothyroidism   . Pancreatic cancer ([place])   . Stenosis of left vertebral artery   . Stroke (cerebrum) ([place]) 10/09/2019  . Stroke [facility])    2011  . Type 2 diabetes mellitus ([place])     Social History:  Social History   Socioeconomic History  . Marital status: Single    Spouse name: Not on file  . Number of children: 2  . Years of education: Not on file  . Highest education level: Not on file  Occupational History  . Occupation: Estate agent  Tobacco Use  . Smoking status: Former Smoker    Start date: 10/06/2019  . Smokeless tobacco: Never Used  Substance and Sexual Activity  . Alcohol use: Yes    Alcohol/week: 1.0 standard drinks    Types: 1 Cans of beer per week    Comment: rare   . Drug use: Never  . Sexual activity: Not on file  Other Topics Concern  . Not on file  Social History Narrative  . Not on file   Social Determinants of Health   Financial Resource Strain: Medium Risk  . Difficulty of Paying  Living Expenses: Somewhat hard  Food Insecurity: No Food Insecurity  . Worried About Charity fundraiser in the Last Year: Never true  . Ran Out of Food in the Last Year: Never true  Transportation Needs: No Transportation Needs  . Lack of Transportation (Medical): No  . Lack of Transportation (Non-Medical): No  Physical Activity: Inactive  . Days of Exercise per Week: 0 days  . Minutes of Exercise per Session: 0 min  Stress: Stress Concern Present  . Feeling of Stress : To some extent  Social Connections: Somewhat Isolated  . Frequency of Communication with Friends and Family: More than three times a week  .  Frequency of Social Gatherings with Friends and Family: Once a week  . Attends Religious Services: More than 4 times per year  . Active Member of Clubs or Organizations: No  . Attends Archivist Meetings: Never  . Marital Status: Divorced  Human resources officer Violence: Not At Risk  . Fear of Current or Ex-Partner: No  . Emotionally Abused: No  . Physically Abused: No  . Sexually Abused: No    Medications:   Current Outpatient Medications on File Prior to Visit  Medication Sig Dispense Refill  . atorvastatin (LIPITOR) 80 MG tablet TAKE 1 TABLET(80 MG) BY MOUTH DAILY AT 6 PM 90 tablet 0  . clopidogrel (PLAVIX) 75 MG tablet Take 1 tablet (75 mg total) by mouth daily. 60 tablet 1  . ECHINACEA EXTRACT PO Take 2 tablets by mouth daily.     Marland Kitchen gabapentin (NEURONTIN) 300 MG capsule Take 1 capsule (300 mg total) by mouth 2 (two) times daily. 180 capsule 0  . insulin aspart (NOVOLOG) 100 UNIT/ML FlexPen Inject 6 Units into the skin 3 (three) times daily with meals. 15 mL 0  . insulin glargine (LANTUS SOLOSTAR) 100 UNIT/ML Solostar Pen Inject 36 Units into the skin at bedtime. 5 pen 2  . Insulin Pen Needle 32G X 4 MM MISC 1 Device by Does not apply route daily. 50 each 2  . lidocaine-prilocaine (EMLA) cream Apply 1 application topically daily as needed (prior to port being accessed (~every 3 months)).    Marland Kitchen loperamide (IMODIUM A-D) 2 MG tablet Take 1 tablet (2 mg total) by mouth as needed. Take 2 at diarrhea onset , then 1 every 2hr until 12hrs with no BM. May take 2 every 4hrs at night. If diarrhea recurs repeat. (Patient taking differently: Take 2-4 mg by mouth 4 (four) times daily as needed for diarrhea or loose stools. ) 100 tablet 1  . meclizine (ANTIVERT) 25 MG tablet Take 1 tablet (25 mg total) by mouth 3 (three) times daily as needed for dizziness. 30 tablet 0  . metoprolol tartrate (LOPRESSOR) 100 MG tablet Take 1 tablet (100 mg) 2 hrs before CT 1 tablet 0  . NP THYROID 30 MG tablet Take  30 mg by mouth daily before breakfast.   2  . Omega-3 1000 MG CAPS Take 1,000 mg by mouth daily.    . ondansetron (ZOFRAN) 8 MG tablet Take 0.5 tablets (4 mg total) by mouth every 6 (six) hours as needed for nausea or vomiting. 60 tablet 3  . prochlorperazine (COMPAZINE) 10 MG tablet Take 1 tablet (10 mg total) by mouth every 6 (six) hours as needed for nausea or vomiting. 30 tablet 3  . sertraline (ZOLOFT) 50 MG tablet TAKE 1 TABLET BY MOUTH DAILY (Patient taking differently: Take 50 mg by mouth daily. ) 90 tablet 1  .  tamsulosin (FLOMAX) 0.4 MG CAPS capsule Take 1 capsule (0.4 mg total) by mouth daily. 90 capsule 3  . traMADol (ULTRAM) 50 MG tablet Take 1 tablet (50 mg total) by mouth every 6 (six) hours as needed. (Patient taking differently: Take 50 mg by mouth every 6 (six) hours as needed (pain.). ) 30 tablet 2  . vitamin B-12 (CYANOCOBALAMIN) 1000 MCG tablet Take 1,000 mcg by mouth daily.    . [DISCONTINUED] atorvastatin (LIPITOR) 80 MG tablet Take 1 tablet (80 mg total) by mouth daily at 6 PM. 90 tablet 0   Current Facility-Administered Medications on File Prior to Visit  Medication Dose Route Frequency Provider Last Rate Last Admin  . 0.9 %  sodium chloride infusion   Intravenous Continuous Derek Jack, MD 20 mL/hr at 12/30/18 1351 New Bag at 12/30/18 1351  . 0.9 %  sodium chloride infusion   Intravenous Continuous Derek Jack, MD 20 mL/hr at 12/30/18 1401 New Bag at 12/30/18 1401  . dextrose 5 % solution   Intravenous Once Derek Jack, MD      . heparin lock flush 100 unit/mL  500 Units Intracatheter Once PRN Derek Jack, MD      . sodium chloride flush (NS) 0.9 % injection 10 mL  10 mL Intracatheter PRN Derek Jack, MD   10 mL at 12/30/18 0911  . sodium chloride flush (NS) 0.9 % injection 10 mL  10 mL Intracatheter PRN Derek Jack, MD   10 mL at 05/21/19 0845    Allergies:   Allergies  Allergen Reactions  . Demerol [Meperidine  Hcl] Other (See Comments)    convulsions    Physical Exam General: Frail middle-age Caucasian male seated, in no evident distress Head: head normocephalic and atraumatic.   Neck: supple with no carotid or supraclavicular bruits Cardiovascular: regular rate and rhythm, no murmurs Musculoskeletal: no deformity Skin:  no rash/petichiae Vascular:  Normal pulses all extremities  Neurologic Exam Mental Status: Awake and fully alert. Oriented to place and time. Recent and remote memory intact. Attention span, concentration and fund of knowledge appropriate. Mood and affect appropriate.  Cranial Nerves: Fundoscopic exam reveals sharp disc margins. Pupils equal, briskly reactive to light. Extraocular movements full without nystagmus. Visual fields full to confrontation. Hearing intact. Facial sensation intact. Face, tongue, palate moves normally and symmetrically.  Motor: Normal bulk and tone. Normal strength in all tested extremity muscles. Sensory.: intact to touch , pinprick , but diminished position and vibratory sensation from ankle down bilaterally..  Romberg sign is positive Coordination: Rapid alternating movements normal in all extremities. Finger-to-nose and heel-to-shin performed accurately bilaterally. Gait and Station: Arises from chair without difficulty. Stance is broad-based gait demonstrates mild ataxia and broad-based.  Unable to walk tandem without difficulty. Reflexes: 1+ and symmetric in upper extremities and depressed in lower extremity.. Toes downgoing.   NIHSS  0 Modified Rankin  1   ASSESSMENT: 64 year old Caucasian male with recent episode of brief loss of consciousness possible prolonged syncope versus unwitnessed seizure with subacute embolic right temporal infarcts or cryptogenic etiology.  Vascular risk factors of hypertension, diabetes, hyperlipidemia , vertebral artery stenosis s/p angioplasty stenting    PLAN: I had a long discussion with the patient with  regards to her recent episodes of passing out followed by some slurred speech and disorientation being of unclear etiology possibly syncope versus seizure given recent subacute infarcts on the MRI.  I recommend repeat EEG and trial of Keppra XR 500 mg daily.  He will follow up results  of his 30-day heart monitor and continue Plavix for stroke prevention for now.  Maintain aggressive risk factor modification with strict control of diabetes with hemoglobin A1c goal below 6.5, hypertension with blood pressure goal below 130/90 and lipids with LDL cholesterol goal below 70 mg percent.  He was advised to eat a healthy diet and be active.  He was also advised not to drive as per [facility] law for the next 3 to 6 months.  He was also advised to keep his appointment for follow-up MRI scan of the brain with and without contrast.  Greater than 50% time during this 50-minute consultation visit was spent on counseling and coordination of care about his cryptogenic stroke, syncopal episode versus seizure and discussion about prevention and treatment he will return for follow-up in the future with my nurse practitioner Janett Billow in 3 months or call earlier if necessary. Antony Contras, MD  [facility] [address] [place], [place] [address]  Phone [phone] Fax [phone] Note: This document was prepared with digital dictation and possible smart phrase technology. Any transcriptional errors that result from this process are unintentional.

## 2019-12-12 ENCOUNTER — Telehealth: Payer: Self-pay | Admitting: *Deleted

## 2019-12-12 NOTE — Telephone Encounter (Signed)
Spoke with patient regarding new appointment date and time for MR MRA head scheduled Wednesday 01/07/20 at 1:00pm at [facility] time is 12:30 at the radiology department---pt also instructed to call Dr. Arlean Hopping office for insurance prior authorization.  He voiced his understanding

## 2019-12-15 ENCOUNTER — Other Ambulatory Visit (HOSPITAL_COMMUNITY)
Admission: RE | Admit: 2019-12-15 | Discharge: 2019-12-15 | Disposition: A | Discharge status: Home / Self Care | Payer: Medicare HMO | Source: Ambulatory Visit | Attending: Cardiovascular Disease | Admitting: Cardiovascular Disease

## 2019-12-15 ENCOUNTER — Other Ambulatory Visit: Payer: Self-pay

## 2019-12-15 DIAGNOSIS — I10 Essential (primary) hypertension: Secondary | ICD-10-CM | POA: Insufficient documentation

## 2019-12-15 LAB — BASIC METABOLIC PANEL
Anion gap: 8 (ref 5–15)
BUN: 22 mg/dL (ref 8–23)
CO2: 27 mmol/L (ref 22–32)
Calcium: 9 mg/dL (ref 8.9–10.3)
Chloride: 102 mmol/L (ref 98–111)
Creatinine, Ser: 0.73 mg/dL (ref 0.61–1.24)
GFR calc Af Amer: >60 mL/min (ref >60)
GFR calc non Af Amer: >60 mL/min (ref >60)
Glucose, Bld: 321 mg/dL — ABNORMAL HIGH (ref 70–99)
Potassium: 3.9 mmol/L (ref 3.5–5.1)
Sodium: 137 mmol/L (ref 135–145)

## 2019-12-16 ENCOUNTER — Encounter (HOSPITAL_COMMUNITY): Payer: Self-pay

## 2019-12-17 ENCOUNTER — Ambulatory Visit (HOSPITAL_COMMUNITY): Payer: Medicare HMO

## 2019-12-18 ENCOUNTER — Inpatient Hospital Stay (HOSPITAL_COMMUNITY): Payer: Medicare HMO | Attending: Hematology

## 2019-12-18 ENCOUNTER — Ambulatory Visit (HOSPITAL_COMMUNITY)
Admission: RE | Admit: 2019-12-18 | Discharge: 2019-12-18 | Disposition: A | Discharge status: Home / Self Care | Payer: Medicare HMO | Source: Ambulatory Visit | Attending: Hematology | Admitting: Hematology

## 2019-12-18 ENCOUNTER — Other Ambulatory Visit: Payer: Self-pay

## 2019-12-18 DIAGNOSIS — E1140 Type 2 diabetes mellitus with diabetic neuropathy, unspecified: Secondary | ICD-10-CM | POA: Diagnosis not present

## 2019-12-18 DIAGNOSIS — C259 Malignant neoplasm of pancreas, unspecified: Secondary | ICD-10-CM | POA: Insufficient documentation

## 2019-12-18 DIAGNOSIS — Z79899 Other long term (current) drug therapy: Secondary | ICD-10-CM | POA: Diagnosis not present

## 2019-12-18 DIAGNOSIS — C251 Malignant neoplasm of body of pancreas: Secondary | ICD-10-CM | POA: Insufficient documentation

## 2019-12-18 DIAGNOSIS — Z9221 Personal history of antineoplastic chemotherapy: Secondary | ICD-10-CM | POA: Insufficient documentation

## 2019-12-18 DIAGNOSIS — K8020 Calculus of gallbladder without cholecystitis without obstruction: Secondary | ICD-10-CM | POA: Diagnosis not present

## 2019-12-18 DIAGNOSIS — Z9081 Acquired absence of spleen: Secondary | ICD-10-CM | POA: Diagnosis not present

## 2019-12-18 DIAGNOSIS — N400 Enlarged prostate without lower urinary tract symptoms: Secondary | ICD-10-CM | POA: Insufficient documentation

## 2019-12-18 DIAGNOSIS — J439 Emphysema, unspecified: Secondary | ICD-10-CM | POA: Diagnosis not present

## 2019-12-18 DIAGNOSIS — Z8673 Personal history of transient ischemic attack (TIA), and cerebral infarction without residual deficits: Secondary | ICD-10-CM | POA: Diagnosis not present

## 2019-12-18 DIAGNOSIS — Z794 Long term (current) use of insulin: Secondary | ICD-10-CM | POA: Insufficient documentation

## 2019-12-18 DIAGNOSIS — Z452 Encounter for adjustment and management of vascular access device: Secondary | ICD-10-CM | POA: Insufficient documentation

## 2019-12-18 DIAGNOSIS — Z87891 Personal history of nicotine dependence: Secondary | ICD-10-CM | POA: Insufficient documentation

## 2019-12-18 DIAGNOSIS — E039 Hypothyroidism, unspecified: Secondary | ICD-10-CM | POA: Diagnosis not present

## 2019-12-18 DIAGNOSIS — E119 Type 2 diabetes mellitus without complications: Secondary | ICD-10-CM | POA: Diagnosis not present

## 2019-12-18 LAB — CBC WITH DIFFERENTIAL/PLATELET
Abs Immature Granulocytes: 0.02 10*3/uL (ref 0.00–0.07)
Basophils Absolute: 0.1 10*3/uL (ref 0.0–0.1)
Basophils Relative: 1 %
Eosinophils Absolute: 0.1 10*3/uL (ref 0.0–0.5)
Eosinophils Relative: 2 %
HCT: 44.1 % (ref 39.0–52.0)
Hemoglobin: 15 g/dL (ref 13.0–17.0)
Immature Granulocytes: 0 %
Lymphocytes Relative: 34 %
Lymphs Abs: 2 10*3/uL (ref 0.7–4.0)
MCH: 32.4 pg (ref 26.0–34.0)
MCHC: 34 g/dL (ref 30.0–36.0)
MCV: 95.2 fL (ref 80.0–100.0)
Monocytes Absolute: 0.7 10*3/uL (ref 0.1–1.0)
Monocytes Relative: 11 %
Neutro Abs: 3.1 10*3/uL (ref 1.7–7.7)
Neutrophils Relative %: 52 %
Platelets: 222 10*3/uL (ref 150–400)
RBC: 4.63 MIL/uL (ref 4.22–5.81)
RDW: 15.2 % (ref 11.5–15.5)
WBC: 5.9 10*3/uL (ref 4.0–10.5)
nRBC: 0 % (ref 0.0–0.2)

## 2019-12-18 LAB — COMPREHENSIVE METABOLIC PANEL
ALT: 14 U/L (ref 0–44)
AST: 17 U/L (ref 15–41)
Albumin: 4.3 g/dL (ref 3.5–5.0)
Alkaline Phosphatase: 56 U/L (ref 38–126)
Anion gap: 9 (ref 5–15)
BUN: 18 mg/dL (ref 8–23)
CO2: 26 mmol/L (ref 22–32)
Calcium: 9.3 mg/dL (ref 8.9–10.3)
Chloride: 102 mmol/L (ref 98–111)
Creatinine, Ser: 0.73 mg/dL (ref 0.61–1.24)
GFR calc Af Amer: >60 mL/min (ref >60)
GFR calc non Af Amer: >60 mL/min (ref >60)
Glucose, Bld: 149 mg/dL — ABNORMAL HIGH (ref 70–99)
Potassium: 3.6 mmol/L (ref 3.5–5.1)
Sodium: 137 mmol/L (ref 135–145)
Total Bilirubin: 1.2 mg/dL (ref 0.3–1.2)
Total Protein: 7.3 g/dL (ref 6.5–8.1)

## 2019-12-18 MED ORDER — IOHEXOL 300 MG/ML  SOLN
100.0000 mL | Freq: Once | INTRAMUSCULAR | Status: AC | PRN
  Administered 2019-12-18: 14:00:00 100 mL via INTRAVENOUS

## 2019-12-19 LAB — CANCER ANTIGEN 19-9: CA 19-9: 13 U/mL (ref 0–35)

## 2019-12-24 ENCOUNTER — Encounter (HOSPITAL_COMMUNITY): Payer: Self-pay | Admitting: Hematology

## 2019-12-24 ENCOUNTER — Inpatient Hospital Stay (HOSPITAL_BASED_OUTPATIENT_CLINIC_OR_DEPARTMENT_OTHER): Payer: Medicare HMO | Admitting: Hematology

## 2019-12-24 ENCOUNTER — Other Ambulatory Visit: Payer: Self-pay

## 2019-12-24 VITALS — BP 126/67 | HR 78 | Temp 96.9°F | Resp 19 | Wt 164.2 lb

## 2019-12-24 DIAGNOSIS — C259 Malignant neoplasm of pancreas, unspecified: Secondary | ICD-10-CM

## 2019-12-24 DIAGNOSIS — E1140 Type 2 diabetes mellitus with diabetic neuropathy, unspecified: Secondary | ICD-10-CM | POA: Diagnosis not present

## 2019-12-24 DIAGNOSIS — E119 Type 2 diabetes mellitus without complications: Secondary | ICD-10-CM | POA: Diagnosis not present

## 2019-12-24 DIAGNOSIS — Z9221 Personal history of antineoplastic chemotherapy: Secondary | ICD-10-CM | POA: Diagnosis not present

## 2019-12-24 DIAGNOSIS — Z452 Encounter for adjustment and management of vascular access device: Secondary | ICD-10-CM | POA: Diagnosis not present

## 2019-12-24 DIAGNOSIS — E039 Hypothyroidism, unspecified: Secondary | ICD-10-CM | POA: Diagnosis not present

## 2019-12-24 DIAGNOSIS — N400 Enlarged prostate without lower urinary tract symptoms: Secondary | ICD-10-CM | POA: Diagnosis not present

## 2019-12-24 DIAGNOSIS — C251 Malignant neoplasm of body of pancreas: Secondary | ICD-10-CM | POA: Diagnosis not present

## 2019-12-24 DIAGNOSIS — Z87891 Personal history of nicotine dependence: Secondary | ICD-10-CM | POA: Diagnosis not present

## 2019-12-24 DIAGNOSIS — Z8673 Personal history of transient ischemic attack (TIA), and cerebral infarction without residual deficits: Secondary | ICD-10-CM | POA: Diagnosis not present

## 2019-12-24 MED ORDER — HEPARIN SOD (PORK) LOCK FLUSH 100 UNIT/ML IV SOLN
500.0000 [IU] | Freq: Once | INTRAVENOUS | Status: AC
  Administered 2019-12-24: 500 [IU] via INTRAVENOUS

## 2019-12-24 MED ORDER — SODIUM CHLORIDE 0.9% FLUSH
10.0000 mL | Freq: Once | INTRAVENOUS | Status: AC
  Administered 2019-12-24: 16:00:00 10 mL via INTRAVENOUS

## 2019-12-24 NOTE — Progress Notes (Signed)
Mark Foster presented for Portacath access and flush. Portacath located in the left chest wall accessed with  H 20 needle. Clean, Dry and Intact Good blood return present. Portacath flushed with 16ml NS and 500U/71ml Heparin per protocol and needle removed intact. Procedure without incident. Patient tolerated procedure well.

## 2019-12-24 NOTE — Progress Notes (Signed)
Mark Foster, [name] [address]   CLINIC:  Medical Oncology/Hematology  PCP:  Ailene Ards, NP [place] [address] [phone]   REASON FOR VISIT:  Follow-up for pancreatic adenocarcinoma.  PRIOR THERAPY: 12 cycles of adjuvant FOLFIRINOX from 12/30/2018 through 06/04/2019.  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  CURRENT THERAPY: Observation.  BRIEF ONCOLOGIC HISTORY:  Oncology History  Pancreatic adenocarcinoma ([place])  08/20/2018 Imaging   CT abdomen/pelvis w/ contrast: IMPRESSION: Atrophy and ductal dilatation involving the pancreatic tail, with suspected small soft tissue mass in the pancreatic body which could represent pancreatic carcinoma. Abdomen MRI and MRCP without and with contrast is recommended for further evaluation.  No evidence of hepatobiliary disease.   08/30/2018 Imaging   MRI abdomen w/ contrast: IMPRESSION: 1. Although not definitive, there remains concern of a small hypoenhancing mass at the junction of the pancreatic body and tail associated with atrophy and ductal dilatation in the pancreatic tail. This remains concerning for pancreatic neoplasm. Postinflammatory stricture less likely. Besides a tiny cystic lesion in the pancreatic tail, there are no other signs of previous pancreatitis. Further evaluation with endoscopic ultrasound for possible biopsy strongly recommended. 2. No evidence of metastatic disease. 3. Cholelithiasis without evidence of cholecystitis or biliary dilatation.   09/03/2018 Initial Diagnosis   Pancreatic adenocarcinoma ([place])   10/03/2018 Procedure   EUS: - 2.6cm irregularly shaped mass in the body of the pancreas that causing main pancreatic duct obstruction and dilation. The mass abuts the splenic vessels but no other significant vascular structures and it was sampled with trangastric EUS FNA. Preliminary cytology is + for malignancy, likely  well-differentiated adenocarcinoma. It appears surgically resectable.   10/03/2018 Pathology Results   Accession: [id number]  FINE NEEDLE ASPIRATION, ENDOSCOPIC, PANCREAS BODY (SPECIMEN 1 OF 1 COLLECTED 10/03/18): ADENOCARCINOMA.   10/17/2018 Imaging   PET: IMPRESSION: 1. Tiny focus of hypermetabolism identified in the body of pancreas, adjacent to the abrupt cut off of the main pancreatic duct. No evidence for hypermetabolic metastatic disease in the neck, chest, abdomen, or pelvis. 2. Cholelithiasis. 3.  Aortic Atherosclerois (ICD10-170.0) 4. 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  The Hereditary Gene Panel offered by [facility] includes sequencing and/or deletion duplication testing of the following 47 genes: APC, ATM, AXIN2, BARD1, BMPR1A, BRCA1, BRCA2, BRIP1, CDH1, CDK4, CDKN2A (p14ARF), CDKN2A (p16INK4a), CHEK2, CTNNA1, DICER1, EPCAM (Deletion/duplication testing only), GREM1 (promoter region deletion/duplication testing only), KIT, MEN1, MLH1, MSH2, MSH3, MSH6, MUTYH, NBN, NF1, NHTL1, PALB2, PDGFRA, PMS2, POLD1, POLE, PTEN, RAD50, RAD51C, RAD51D, SDHB, SDHC, SDHD, SMAD4, SMARCA4. STK11, TP53, TSC1, TSC2, and VHL.  The following genes were evaluated for sequence changes only: SDHA and HOXB13 c.251G>A variant only. The report date is November 12, 2018.   11/20/2018 Surgery   Distal subtotal pancreatectomy with splenectomy at [facility]   11/20/2018 Surgery   TUMOR   Tumor Site:  Pancreatic body    Histologic Type:  Ductal adenocarcinoma    Histologic Grade:  G1: Well differentiated    Tumor Size:  Greatest dimension in Centimeters (cm): 2.8 Centimeters (cm)    Additional Dimension in Centimeters (cm):  2.7 Centimeters (cm)    Additional Dimension in Centimeters (cm):  1.8 Centimeters (cm)   Tumor Extent:      Tumor Extension:  Tumor is confined to pancreas    Accessory Findings:      Treatment  Effect:  No known  presurgical therapy     Lymphovascular Invasion:  Not identified     Perineural Invasion:  Not identified   MARGINS   Margins:      Proximal Pancreatic Parenchymal Margin:  Uninvolved by invasive carcinoma and pancreatic high-grade intraepithelial neoplasia      Distance of Invasive Carcinoma from Margin:  0.6 Centimeters (cm)   :      Other Margin:  Splenic margin.      Margin Status:  Uninvolved by invasive carcinoma   LYMPH NODES  Number of Lymph Nodes Involved:  2   Number of Lymph Nodes Examined:  16   PATHOLOGIC STAGE CLASSIFICATION (pTNM, AJCC 8th Edition)  TNM Descriptors:  Not applicable   Primary Tumor (pT):  pT2   Regional Lymph Nodes (pN):  pN1   ADDITIONAL FINDINGS  Additional Pathologic Findings:  Pancreatic intraepithelial neoplasia    Highest Grade (PanIN):  High grade PanIN is present.   Additional Pathologic Findings:  Benign unilocular pancreatic cyst (1.3 cm in greatest dimension) at the pancreatic tail.   12/30/2018 -  Chemotherapy   The patient had palonosetron (ALOXI) injection 0.25 mg, 0.25 mg, Intravenous,  Once, 12 of 12 cycles Administration: 0.25 mg (12/30/2018), 0.25 mg (01/13/2019), 0.25 mg (01/28/2019), 0.25 mg (02/11/2019), 0.25 mg (02/25/2019), 0.25 mg (03/11/2019), 0.25 mg (03/25/2019), 0.25 mg (04/09/2019), 0.25 mg (04/23/2019), 0.25 mg (05/07/2019), 0.25 mg (05/21/2019), 0.25 mg (06/04/2019) pegfilgrastim (NEULASTA) injection 6 mg, 6 mg, Subcutaneous, Once, 7 of 7 cycles Administration: 6 mg (01/01/2019), 6 mg (01/15/2019), 6 mg (01/30/2019), 6 mg (02/13/2019), 6 mg (02/27/2019), 6 mg (03/13/2019), 6 mg (03/27/2019) pegfilgrastim-jmdb (FULPHILA) injection 6 mg, 6 mg, Subcutaneous,  Once, 5 of 5 cycles Administration: 6 mg (04/11/2019), 6 mg (04/25/2019), 6 mg (05/09/2019), 6 mg (05/23/2019), 6 mg (06/06/2019) irinotecan (CAMPTOSAR) 280 mg in sodium chloride 0.9 % 500 mL chemo infusion, 150 mg/m2 = 280 mg (100 %  of original dose 150 mg/m2), Intravenous,  Once, 12 of 12 cycles Dose modification: 150 mg/m2 (original dose 150 mg/m2, Cycle 1, Reason: Provider Judgment) Administration: 280 mg (12/30/2018), 280 mg (01/13/2019), 280 mg (01/28/2019), 280 mg (02/11/2019), 280 mg (02/25/2019), 280 mg (03/11/2019), 280 mg (03/25/2019), 280 mg (04/09/2019), 280 mg (04/23/2019), 280 mg (05/07/2019), 280 mg (05/21/2019), 280 mg (06/04/2019) leucovorin 700 mg in sodium chloride 0.9 % 250 mL infusion, 744 mg, Intravenous,  Once, 12 of 12 cycles Administration: 700 mg (12/30/2018), 700 mg (01/13/2019), 700 mg (01/28/2019), 700 mg (02/11/2019), 700 mg (02/25/2019), 700 mg (03/11/2019), 700 mg (03/25/2019), 700 mg (04/09/2019), 700 mg (04/23/2019), 700 mg (05/07/2019), 700 mg (05/21/2019), 700 mg (06/04/2019) oxaliplatin (ELOXATIN) 120 mg in dextrose 5 % 500 mL chemo infusion, 63.75 mg/m2 = 120 mg (75 % of original dose 85 mg/m2), Intravenous,  Once, 12 of 12 cycles Dose modification: 63.75 mg/m2 (75 % of original dose 85 mg/m2, Cycle 1, Reason: Other (see comments), Comment: high risk for worsening neuropathy), 42.5 mg/m2 (50 % of original dose 85 mg/m2, Cycle 11, Reason: Other (see comments), Comment: neuropathy) Administration: 120 mg (12/30/2018), 120 mg (01/13/2019), 120 mg (01/28/2019), 120 mg (02/11/2019), 120 mg (02/25/2019), 120 mg (03/11/2019), 120 mg (03/25/2019), 120 mg (04/09/2019), 120 mg (04/23/2019), 120 mg (05/07/2019), 80 mg (05/21/2019), 80 mg (06/04/2019) fosaprepitant (EMEND) 150 mg, dexamethasone (DECADRON) 12 mg in sodium chloride 0.9 % 145 mL IVPB, , Intravenous,  Once, 12 of 12 cycles Administration:  (12/30/2018),  (01/13/2019),  (01/28/2019),  (02/11/2019),  (02/25/2019),  (03/11/2019),  (03/25/2019),  (04/09/2019),  (04/23/2019),  (05/07/2019),  (  05/21/2019),  (06/04/2019) fluorouracil (ADRUCIL) 4,450 mg in sodium chloride 0.9 % 61 mL chemo infusion, 2,400 mg/m2 = 4,450 mg, Intravenous, 1 Day/Dose, 12 of 12 cycles Administration: 4,450 mg (12/30/2018), 4,450 mg  (01/13/2019), 4,450 mg (01/28/2019), 4,450 mg (02/11/2019), 4,450 mg (02/25/2019), 4,450 mg (03/11/2019), 4,450 mg (03/25/2019), 4,450 mg (04/09/2019), 4,450 mg (04/23/2019), 4,450 mg (05/07/2019), 4,450 mg (05/21/2019), 4,450 mg (06/04/2019)  for chemotherapy treatment.      CANCER STAGING: Cancer Staging No matching staging information was found for the patient.   INTERVAL HISTORY:  Mr. Peavy 64 y.o. male seen for follow-up of pancreatic adenocarcinoma.  Appetite is 75%.  Energy levels are 25%.  Occasional dizziness has been stable.  Denies any abdominal pains.  Numbness in the legs has been stable.  Abdominal wall hernia has been stable.    REVIEW OF SYSTEMS:  Review of Systems  Neurological: Positive for dizziness and numbness.  All other systems reviewed and are negative.    PAST MEDICAL/SURGICAL HISTORY:  Past Medical History:  Diagnosis Date  . Cervical compression fracture ([place])   . Cervical spinal stenosis   . Diabetic neuropathy (H[facility])    Bilateral legs  . Diverticulitis   . Family history of pancreatic cancer   . History of kidney stones   . Hypothyroidism   . Pancreatic cancer ([place])   . Stenosis of left vertebral artery   . 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 Stroke [facility])    2011  . Type 2 diabetes mellitus ([place])    Past Surgical History:  Procedure Laterality Date  . APPENDECTOMY    . BUBBLE STUDY N/A 11/18/2019   Procedure: BUBBLE STUDY;  Surgeon: Herminio Commons, MD;  Location: [facility];  Service: Cardiology;  Laterality: N/A;  . COLON RESECTION     For diverticulitis  . COLON SURGERY    . ESOPHAGOGASTRODUODENOSCOPY N/A 10/03/2018   Procedure: ESOPHAGOGASTRODUODENOSCOPY (EGD);  Surgeon: [name], MD;  Location: [name];  Service: Endoscopy;  Laterality: N/A;  . EUS N/A 10/03/2018   Procedure: UPPER ENDOSCOPIC ULTRASOUND (EUS) RADIAL;  Surgeon: [name], MD;  Location: [facility];  Service: Endoscopy;  Laterality: N/A;  . FINE NEEDLE  ASPIRATION N/A 10/03/2018   Procedure: FINE NEEDLE ASPIRATION (FNA) LINEAR;  Surgeon: [name], MD;  Location: [facility];  Service: Endoscopy;  Laterality: N/A;  . IR ANGIO INTRA EXTRACRAN SEL COM CAROTID INNOMINATE BILAT MOD SED  01/21/2018  . IR ANGIO INTRA EXTRACRAN SEL COM CAROTID INNOMINATE UNI R MOD SED  03/21/2018  . IR ANGIO VERTEBRAL SEL VERTEBRAL BILAT MOD SED  01/21/2018  . IR TRANSCATH EXCRAN VERT OR CAR A STENT  03/21/2018  . PORTACATH PLACEMENT Left 12/23/2018   Procedure: INSERTION PORT-A-CATH;  Surgeon: Virl Cagey, MD;  Location: [facility];  Service: General;  Laterality: Left;  . RADIOLOGY WITH ANESTHESIA N/A 03/21/2018   Procedure: IR WITH ANESTHESIA WITH STENT PLACEMENT;  Surgeon: Luanne Bras, MD;  Location: [facility];  Service: Radiology;  Laterality: N/A;  . ROTATOR CUFF REPAIR     Left  . TEE WITHOUT CARDIOVERSION N/A 11/18/2019   Procedure: TRANSESOPHAGEAL ECHOCARDIOGRAM (TEE) WITH PROPOFOL;  Surgeon: Herminio Commons, MD;  Location: [facility];  Service: Cardiology;  Laterality: N/A;     SOCIAL HISTORY:  Social History   Socioeconomic History  . Marital status: Single    Spouse name: Not on file  . Number of children: 2  . Years of education: Not on file  . Highest  education level: Not on file  Occupational History  . Occupation: Estate agent  Tobacco Use  . Smoking status: Former Smoker    Start date: 10/06/2019  . Smokeless tobacco: Never Used  Substance and Sexual Activity  . Alcohol use: Yes    Alcohol/week: 1.0 standard drinks    Types: 1 Cans of beer per week    Comment: rare   . Drug use: Never  . Sexual activity: Not on file  Other Topics Concern  . Not on file  Social History Narrative  . Not on file   Social Determinants of Health   Financial Resource Strain:   . Difficulty of Paying Living Expenses:   Food Insecurity:   . Worried About Charity fundraiser in the Last Year:   . Arboriculturist in the Last Year:     Transportation Needs:   . Film/video editor (Medical):   Marland Kitchen Lack of Transportation (Non-Medical):   Physical Activity:   . Days of Exercise per Week:   . Minutes of Exercise per Session:   Stress:   . Feeling of Stress :   Social Connections:   . Frequency of Communication with Friends and Family:   . Frequency of Social Gatherings with Friends and Family:   . Attends Religious Services:   . Active Member of Clubs or Organizations:   . Attends Archivist Meetings:   Marland Kitchen Marital Status:   Intimate Partner Violence:   . Fear of Current or Ex-Partner:   . Emotionally Abused:   Marland Kitchen Physically Abused:   . Sexually Abused:     FAMILY HISTORY:  Family History  Problem Relation Age of Onset  . Stroke Mother   . Pancreatic cancer Father 64       d. 42  . Stroke Maternal Grandmother   . Heart attack Maternal Grandfather   . Heart Problems Paternal Grandfather   . Scoliosis Daughter   . Muscular dystrophy Grandson     CURRENT MEDICATIONS:  Outpatient Encounter Medications as of 12/24/2019  Medication Sig  . atorvastatin (LIPITOR) 80 MG tablet TAKE 1 TABLET(80 MG) BY MOUTH DAILY AT 6 PM  . clopidogrel (PLAVIX) 75 MG tablet Take 1 tablet (75 mg total) by mouth daily.  Marland Kitchen ECHINACEA EXTRACT PO Take 2 tablets by mouth daily.   Marland Kitchen gabapentin (NEURONTIN) 300 MG capsule Take 1 capsule (300 mg total) by mouth 2 (two) times daily.  . insulin aspart (NOVOLOG) 100 UNIT/ML FlexPen Inject 6 Units into the skin 3 (three) times daily with meals.  . insulin glargine (LANTUS SOLOSTAR) 100 UNIT/ML Solostar Pen Inject 36 Units into the skin at bedtime.  . Insulin Pen Needle 32G X 4 MM MISC 1 Device by Does not apply route daily.  . NP THYROID 30 MG tablet Take 30 mg by mouth daily before breakfast.   . Omega-3 1000 MG CAPS Take 1,000 mg by mouth daily.  . sertraline (ZOLOFT) 50 MG tablet TAKE 1 TABLET BY MOUTH DAILY (Patient taking differently: Take 50 mg by mouth daily. )  . tamsulosin  (FLOMAX) 0.4 MG CAPS capsule Take 1 capsule (0.4 mg total) by mouth daily.  . vitamin B-12 (CYANOCOBALAMIN) 1000 MCG tablet Take 1,000 mcg by mouth daily.  Marland Kitchen lidocaine-prilocaine (EMLA) cream Apply 1 application topically daily as needed (prior to port being accessed (~every 3 months)).  Marland Kitchen loperamide (IMODIUM A-D) 2 MG tablet Take 1 tablet (2 mg total) by mouth as needed. Take 2 at diarrhea  onset , then 1 every 2hr until 12hrs with no BM. May take 2 every 4hrs at night. If diarrhea recurs repeat. (Patient not taking: Reported on 12/24/2019)  . meclizine (ANTIVERT) 25 MG tablet Take 1 tablet (25 mg total) by mouth 3 (three) times daily as needed for dizziness. (Patient not taking: Reported on 12/24/2019)  . metoprolol tartrate (LOPRESSOR) 100 MG tablet Take 1 tablet (100 mg) 2 hrs before CT (Patient not taking: Reported on 12/24/2019)  . ondansetron (ZOFRAN) 8 MG tablet Take 0.5 tablets (4 mg total) by mouth every 6 (six) hours as needed for nausea or vomiting. (Patient not taking: Reported on 12/24/2019)  . prochlorperazine (COMPAZINE) 10 MG tablet Take 1 tablet (10 mg total) by mouth every 6 (six) hours as needed for nausea or vomiting. (Patient not taking: Reported on 12/24/2019)  . traMADol (ULTRAM) 50 MG tablet Take 1 tablet (50 mg total) by mouth every 6 (six) hours as needed. (Patient not taking: Reported on 12/24/2019)  . [DISCONTINUED] atorvastatin (LIPITOR) 80 MG tablet Take 1 tablet (80 mg total) by mouth daily at 6 PM.   Facility-Administered Encounter Medications as of 12/24/2019  Medication  . 0.9 %  sodium chloride infusion  . 0.9 %  sodium chloride infusion  . dextrose 5 % solution  . heparin lock flush 100 unit/mL  . [COMPLETED] heparin lock flush 100 unit/mL  . levETIRAcetam (KEPPRA XR) 24 hr tablet 500 mg  . sodium chloride flush (NS) 0.9 % injection 10 mL  . sodium chloride flush (NS) 0.9 % injection 10 mL  . [COMPLETED] sodium chloride flush (NS) 0.9 % injection 10 mL     ALLERGIES:  Allergies  Allergen Reactions  . Demerol [Meperidine Hcl] Other (See Comments)    convulsions     PHYSICAL EXAM:  ECOG Performance status: 1  Vitals:   12/24/19 1456  BP: 126/67  Pulse: 78  Resp: 19  Temp: (!) 96.9 F (36.1 C)  SpO2: 100%   Filed Weights   12/24/19 1456  Weight: 164 lb 3.2 oz (74.5 kg)   Physical Exam Vitals reviewed.  Constitutional:      Appearance: Normal appearance.  Cardiovascular:     Rate and Rhythm: Normal rate and regular rhythm.     Heart sounds: Normal heart sounds.  Pulmonary:     Effort: Pulmonary effort is normal.     Breath sounds: Normal breath sounds.  Abdominal:     General: There is no distension.     Palpations: Abdomen is soft. There is no mass.  Neurological:     General: No focal deficit present.     Mental Status: He is alert and oriented to person, place, and time.  Psychiatric:        Mood and Affect: Mood normal.        Behavior: Behavior normal.      LABORATORY DATA:  I have reviewed the labs as listed.  CBC    Component Value Date/Time   WBC 5.9 12/18/2019 1207   RBC 4.63 12/18/2019 1207   HGB 15.0 12/18/2019 1207   HGB 14.9 12/11/2018 1018   HCT 44.1 12/18/2019 1207   PLT 222 12/18/2019 1207   PLT 335 12/11/2018 1018   MCV 95.2 12/18/2019 1207   MCH 32.4 12/18/2019 1207   MCHC 34.0 12/18/2019 1207   RDW 15.2 12/18/2019 1207   LYMPHSABS 2.0 12/18/2019 1207   MONOABS 0.7 12/18/2019 1207   EOSABS 0.1 12/18/2019 1207   BASOSABS 0.1 12/18/2019 1207  CMP Latest Ref Rng & Units 12/18/2019 12/15/2019 11/14/2019  Glucose 70 - 99 mg/dL 149(H) 321(H) 133(H)  BUN 8 - 23 mg/dL 18 22 24(H)  Creatinine 0.61 - 1.24 mg/dL 0.73 0.73 0.64  Sodium 135 - 145 mmol/L 137 137 137  Potassium 3.5 - 5.1 mmol/L 3.6 3.9 4.4  Chloride 98 - 111 mmol/L 102 102 102  CO2 22 - 32 mmol/L '26 27 25  '$ Calcium 8.9 - 10.3 mg/dL 9.3 9.0 9.4  Total Protein 6.5 - 8.1 g/dL 7.3 - -  Total Bilirubin 0.3 - 1.2 mg/dL 1.2 - -   Alkaline Phos 38 - 126 U/L 56 - -  AST 15 - 41 U/L 17 - -  ALT 0 - 44 U/L 14 - -    DIAGNOSTIC IMAGING:  I have independently reviewed the scans and discussed with the patient.  ASSESSMENT & PLAN:  Pancreatic adenocarcinoma (HCC) 1.  Stage IIb (T2N1) pancreatic adenocarcinoma: -Status post distal pancreatectomy and splenectomy at [facility] by Dr. Zenia Resides on 11/20/2018. 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 -12 cycles of FOLFIRINOX from 12/30/2018 through 06/04/2019. -MRI of the brain on 05/19/2019 did not show any abnormalities.  This was done because of new onset numbness in the left lower lip during therapy. -Denies any new onset abdominal pains.  We reviewed results of CT CAP from 12/18/2019 which showed no findings of recurrence or metastatic disease. -We reviewed his labs.  CA 19-9 was normal. -We will see him back in 3 months with repeat labs and scans.  He will continue port flush every 3 months. -He is undergoing work-up for his stroke and seeing multiple specialist.  2.  Post splenectomy state: -He received vaccination prior to splenectomy.  Will need to follow on post to schedule.  3.  Diabetes: -He is continue Engineer, agricultural.  Is managed by Dr. Dorris Fetch.  4.  Neuropathy: -On and off numbness in the toes and left hand is stable.     Orders placed this encounter:  Orders Placed This Encounter  Procedures  . CT Abdomen Pelvis W Contrast  . Cancer antigen 19-9      Derek Jack, MD [place] [phone]

## 2019-12-24 NOTE — Patient Instructions (Addendum)
[place] at [facility] Discharge Instructions  You were seen today by Dr. Delton Coombes. He went over your recent lab results. He will see you back in 3 months for labs, repeat scan and follow up.   Thank you for choosing [place] at [facility] to provide your oncology and hematology care.  To afford each patient quality time with our provider, please arrive at least 15 minutes before your scheduled appointment time.   If you have a lab appointment with the [facility] please come in thru the  Main Entrance and check in at the main information desk  You need to re-schedule your appointment should you arrive 10 or more minutes late.  We strive to give you quality time with our providers, and arriving late affects you and other patients whose appointments are after yours.  Also, if you no show three or more times for appointments you may be dismissed from the clinic at the providers discretion.     Again, thank you for choosing [facility].  Our hope is that these requests will decrease the amount of time that you wait before being seen by our physicians.       _____________________________________________________________  Should you have questions after your visit to [facility], please contact our office at [phone] between the hours of 8:00 a.m. and 4:30 p.m.  Voicemails left after 4:00 p.m. will not be returned until the following business day.  For prescription refill requests, have your pharmacy contact our office and allow 72 hours.    Cancer Center Support Programs:   > Cancer Support Group  2nd Tuesday of the month 1pm-2pm, Journey Room

## 2019-12-24 NOTE — Assessment & Plan Note (Signed)
1.  Stage IIb (T2N1) pancreatic adenocarcinoma: -Status post distal pancreatectomy and splenectomy at [facility] by Dr. Zenia Resides on 11/20/2018. 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 -12 cycles of FOLFIRINOX from 12/30/2018 through 06/04/2019. -MRI of the brain on 05/19/2019 did not show any abnormalities.  This was done because of new onset numbness in the left lower lip during therapy. -Denies any new onset abdominal pains.  We reviewed results of CT CAP from 12/18/2019 which showed no findings of recurrence or metastatic disease. -We reviewed his labs.  CA 19-9 was normal. -We will see him back in 3 months with repeat labs and scans.  He will continue port flush every 3 months. -He is undergoing work-up for his stroke and seeing multiple specialist.  2.  Post splenectomy state: -He received vaccination prior to splenectomy.  Will need to follow on post to schedule.  3.  Diabetes: -He is continue Engineer, agricultural.  Is managed by Dr. Dorris Fetch.  4.  Neuropathy: -On and off numbness in the toes and left hand is stable.

## 2019-12-25 ENCOUNTER — Telehealth (HOSPITAL_COMMUNITY): Payer: Self-pay | Admitting: Emergency Medicine

## 2019-12-25 NOTE — Telephone Encounter (Signed)
Reaching out to patient to offer assistance regarding upcoming cardiac imaging study; pt verbalizes understanding of appt date/time, parking situation and where to check in, pre-test NPO status and medications ordered, and verified current allergies; name and call back number provided for further questions should they arise Ulys Favia RN Navigator Cardiac Imaging [facility] [phone] office [phone] cell 

## 2019-12-26 ENCOUNTER — Encounter (HOSPITAL_COMMUNITY): Payer: Self-pay

## 2019-12-26 ENCOUNTER — Other Ambulatory Visit: Payer: Self-pay

## 2019-12-26 ENCOUNTER — Telehealth (HOSPITAL_COMMUNITY): Payer: Self-pay | Admitting: Emergency Medicine

## 2019-12-26 ENCOUNTER — Ambulatory Visit (HOSPITAL_COMMUNITY)
Admission: RE | Admit: 2019-12-26 | Discharge: 2019-12-26 | Disposition: A | Discharge status: Home / Self Care | Payer: Medicare HMO | Source: Ambulatory Visit | Attending: Cardiovascular Disease | Admitting: Cardiovascular Disease

## 2019-12-26 DIAGNOSIS — I200 Unstable angina: Secondary | ICD-10-CM | POA: Diagnosis not present

## 2019-12-26 DIAGNOSIS — I2510 Atherosclerotic heart disease of native coronary artery without angina pectoris: Secondary | ICD-10-CM | POA: Diagnosis not present

## 2019-12-26 MED ORDER — IOHEXOL 350 MG/ML SOLN
80.0000 mL | Freq: Once | INTRAVENOUS | Status: AC | PRN
  Administered 2019-12-26: 80 mL via INTRAVENOUS

## 2019-12-26 MED ORDER — NITROGLYCERIN 0.4 MG SL SUBL
0.8000 mg | SUBLINGUAL_TABLET | Freq: Once | SUBLINGUAL | Status: AC
  Administered 2019-12-26: 0.8 mg via SUBLINGUAL

## 2019-12-26 MED ORDER — NITROGLYCERIN 0.4 MG SL SUBL
SUBLINGUAL_TABLET | SUBLINGUAL | Status: AC
  Filled 2019-12-26: qty 2

## 2019-12-26 NOTE — Telephone Encounter (Signed)
Pt calling to clarify when to take PO metoprolol prior to scan appt.   appt : 1:45p Take meds : 11:45a  Pt appreciated the instructions, no further questions  Marchia Bond RN Navigator Cardiac Imaging [facility] [phone] Office  [phone] Cell

## 2019-12-27 ENCOUNTER — Ambulatory Visit (HOSPITAL_COMMUNITY)
Admission: RE | Admit: 2019-12-27 | Discharge: 2019-12-27 | Disposition: A | Discharge status: Home / Self Care | Payer: Medicare HMO | Source: Ambulatory Visit | Attending: Cardiovascular Disease | Admitting: Cardiovascular Disease

## 2019-12-27 DIAGNOSIS — I25110 Atherosclerotic heart disease of native coronary artery with unstable angina pectoris: Secondary | ICD-10-CM | POA: Insufficient documentation

## 2019-12-27 DIAGNOSIS — I200 Unstable angina: Secondary | ICD-10-CM

## 2019-12-29 ENCOUNTER — Ambulatory Visit (INDEPENDENT_AMBULATORY_CARE_PROVIDER_SITE_OTHER): Payer: Medicare Other | Admitting: Nurse Practitioner

## 2019-12-29 DIAGNOSIS — I2510 Atherosclerotic heart disease of native coronary artery without angina pectoris: Secondary | ICD-10-CM | POA: Diagnosis not present

## 2019-12-30 ENCOUNTER — Telehealth: Payer: Self-pay

## 2019-12-30 NOTE — Telephone Encounter (Signed)
-----   Message from Laurine Blazer, LPN sent at [id number]  4:28 PM EDT -----  ----- Message ----- From: Herminio Commons, MD Sent: 12/27/2019  11:35 AM EDT To: Laurine Blazer, LPN  He appears to have multiple blockages. Further studies are being done with CT images for clarification. Arrange for earlier follow up to discuss (can be virtual if he prefers).

## 2019-12-30 NOTE — Telephone Encounter (Signed)
Pt notified of CT results, will come on 01/06/20 at 8:40 am with Dr. Bronson Ing

## 2020-01-05 ENCOUNTER — Ambulatory Visit: Payer: Medicare HMO | Admitting: Neurology

## 2020-01-05 ENCOUNTER — Ambulatory Visit (INDEPENDENT_AMBULATORY_CARE_PROVIDER_SITE_OTHER): Payer: Medicare HMO | Admitting: Nurse Practitioner

## 2020-01-05 DIAGNOSIS — G40209 Localization-related (focal) (partial) symptomatic epilepsy and epileptic syndromes with complex partial seizures, not intractable, without status epilepticus: Secondary | ICD-10-CM

## 2020-01-06 ENCOUNTER — Other Ambulatory Visit (HOSPITAL_COMMUNITY)
Admission: RE | Admit: 2020-01-06 | Discharge: 2020-01-06 | Disposition: A | Discharge status: Home / Self Care | Payer: Medicare HMO | Source: Ambulatory Visit | Attending: Cardiology | Admitting: Cardiology

## 2020-01-06 ENCOUNTER — Other Ambulatory Visit: Payer: Self-pay | Admitting: Cardiovascular Disease

## 2020-01-06 ENCOUNTER — Other Ambulatory Visit: Payer: Self-pay

## 2020-01-06 ENCOUNTER — Other Ambulatory Visit (HOSPITAL_COMMUNITY)
Admission: RE | Admit: 2020-01-06 | Discharge: 2020-01-06 | Disposition: A | Discharge status: Home / Self Care | Payer: Medicare HMO | Source: Ambulatory Visit | Attending: Cardiovascular Disease | Admitting: Cardiovascular Disease

## 2020-01-06 ENCOUNTER — Ambulatory Visit (INDEPENDENT_AMBULATORY_CARE_PROVIDER_SITE_OTHER): Payer: Medicare HMO | Admitting: Cardiovascular Disease

## 2020-01-06 ENCOUNTER — Ambulatory Visit (INDEPENDENT_AMBULATORY_CARE_PROVIDER_SITE_OTHER): Payer: Medicare HMO

## 2020-01-06 ENCOUNTER — Encounter: Payer: Self-pay | Admitting: Cardiovascular Disease

## 2020-01-06 ENCOUNTER — Telehealth: Payer: Self-pay

## 2020-01-06 VITALS — BP 134/68 | HR 88 | Temp 98.2°F | Ht 70.0 in | Wt 165.0 lb

## 2020-01-06 DIAGNOSIS — E785 Hyperlipidemia, unspecified: Secondary | ICD-10-CM

## 2020-01-06 DIAGNOSIS — I209 Angina pectoris, unspecified: Secondary | ICD-10-CM | POA: Diagnosis not present

## 2020-01-06 DIAGNOSIS — I25118 Atherosclerotic heart disease of native coronary artery with other forms of angina pectoris: Secondary | ICD-10-CM

## 2020-01-06 DIAGNOSIS — I499 Cardiac arrhythmia, unspecified: Secondary | ICD-10-CM

## 2020-01-06 DIAGNOSIS — Z20822 Contact with and (suspected) exposure to covid-19: Secondary | ICD-10-CM | POA: Diagnosis not present

## 2020-01-06 DIAGNOSIS — I10 Essential (primary) hypertension: Secondary | ICD-10-CM | POA: Diagnosis not present

## 2020-01-06 DIAGNOSIS — I639 Cerebral infarction, unspecified: Secondary | ICD-10-CM

## 2020-01-06 DIAGNOSIS — I6522 Occlusion and stenosis of left carotid artery: Secondary | ICD-10-CM

## 2020-01-06 LAB — BASIC METABOLIC PANEL
Anion gap: 10 (ref 5–15)
BUN: 17 mg/dL (ref 8–23)
CO2: 26 mmol/L (ref 22–32)
Calcium: 9.2 mg/dL (ref 8.9–10.3)
Chloride: 103 mmol/L (ref 98–111)
Creatinine, Ser: 0.67 mg/dL (ref 0.61–1.24)
GFR calc Af Amer: >60 mL/min (ref >60)
GFR calc non Af Amer: >60 mL/min (ref >60)
Glucose, Bld: 298 mg/dL — ABNORMAL HIGH (ref 70–99)
Potassium: 4 mmol/L (ref 3.5–5.1)
Sodium: 139 mmol/L (ref 135–145)

## 2020-01-06 LAB — LIPID PANEL
Cholesterol: 174 mg/dL (ref 0–200)
HDL: 60 mg/dL
LDL Cholesterol: 99 mg/dL (ref 0–99)
Total CHOL/HDL Ratio: 2.9 ratio
Triglycerides: 73 mg/dL
VLDL: 15 mg/dL (ref 0–40)

## 2020-01-06 LAB — CBC
HCT: 44.9 % (ref 39.0–52.0)
Hemoglobin: 15.2 g/dL (ref 13.0–17.0)
MCH: 32.1 pg (ref 26.0–34.0)
MCHC: 33.9 g/dL (ref 30.0–36.0)
MCV: 94.7 fL (ref 80.0–100.0)
Platelets: 252 10*3/uL (ref 150–400)
RBC: 4.74 MIL/uL (ref 4.22–5.81)
RDW: 14.7 % (ref 11.5–15.5)
WBC: 5.1 10*3/uL (ref 4.0–10.5)
nRBC: 0 % (ref 0.0–0.2)

## 2020-01-06 MED ORDER — EZETIMIBE 10 MG PO TABS
10.0000 mg | ORAL_TABLET | Freq: Every day | ORAL | 6 refills | Status: DC
Start: 2020-01-06 — End: 2020-04-08

## 2020-01-06 MED ORDER — METOPROLOL TARTRATE 25 MG PO TABS
25.0000 mg | ORAL_TABLET | Freq: Two times a day (BID) | ORAL | 3 refills | Status: DC
Start: 2020-01-06 — End: 2021-06-14

## 2020-01-06 MED ORDER — SODIUM CHLORIDE 0.9% FLUSH: 3.0000 mL | Freq: Two times a day (BID) | INTRAVENOUS | Status: DC

## 2020-01-06 MED ORDER — NITROGLYCERIN 0.4 MG SL SUBL
0.4000 mg | SUBLINGUAL_TABLET | SUBLINGUAL | 3 refills | Status: DC | PRN
Start: 2020-01-06 — End: 2020-01-31

## 2020-01-06 NOTE — Addendum Note (Signed)
Addended by: Levonne Hubert on: 01/06/2020 09:23 AM   Modules accepted: Orders

## 2020-01-06 NOTE — H&P (View-Only) (Signed)
SUBJECTIVE: The patient presents for follow-up to review cardiac testing.  TEE which I performed on 11/18/2019 demonstrated normal LV systolic function and regional wall motion, EF 60 to 65%.  There is no evidence of left atrial appendage thrombus nor evidence of an interatrial shunt with bubble study.  Coronary CT angiography on 12/29/2019 demonstrated significant stenosis of the proximal/mid/distal LAD, distal circumflex, and mid to distal RCA.  Fractional flow reserve was performed.  Event monitoring did not demonstrate any evidence of atrial fibrillation or flutter.  Past medical history includes left carotid artery occlusion, history of pancreatic cancer for which he underwent Whipple surgery in 2020 at [facility] and treated with chemotherapy, and hypertension.  He also had a stroke in 2011.  He has felt fatigued ever since his stroke in February.  He walks with a cane.  He continues to experience episodic precordial chest pain.  He denies palpitations.  He has significant exertional dyspnea.  He was short of breath and walking from the parking lot into our office.  This has gotten progressively worse.   Social history: He is originally from [place], [place].  His son lives there.  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  He likes playing country music and classic rock on guitar.  He also plays at his church and at [employer] in [place] on Wednesday nights.  Review of Systems: As per "subjective", otherwise negative.  Allergies  Allergen Reactions  . Demerol [Meperidine Hcl] Other (See Comments)    convulsions    Current Outpatient Medications  Medication Sig Dispense Refill  . clopidogrel (PLAVIX) 75 MG tablet Take 1 tablet (75 mg total) by mouth daily. 60 tablet 1  . ECHINACEA EXTRACT PO Take 2 tablets by mouth daily.     Marland Kitchen gabapentin (NEURONTIN) 300 MG capsule Take 1 capsule (300 mg total) by mouth 2 (two) times daily. 180 capsule 0  . insulin aspart  (NOVOLOG) 100 UNIT/ML FlexPen Inject 6 Units into the skin 3 (three) times daily with meals. 15 mL 0  . insulin glargine (LANTUS SOLOSTAR) 100 UNIT/ML Solostar Pen Inject 36 Units into the skin at bedtime. 5 pen 2  . Insulin Pen Needle 32G X 4 MM MISC 1 Device by Does not apply route daily. 50 each 2  . lidocaine-prilocaine (EMLA) cream Apply 1 application topically daily as needed (prior to port being accessed (~every 3 months)).    Marland Kitchen loperamide (IMODIUM A-D) 2 MG tablet Take 1 tablet (2 mg total) by mouth as needed. Take 2 at diarrhea onset , then 1 every 2hr until 12hrs with no BM. May take 2 every 4hrs at night. If diarrhea recurs repeat. 100 tablet 1  . meclizine (ANTIVERT) 25 MG tablet Take 1 tablet (25 mg total) by mouth 3 (three) times daily as needed for dizziness. 30 tablet 0  . NP THYROID 30 MG tablet Take 30 mg by mouth daily before breakfast.   2  . Omega-3 1000 MG CAPS Take 1,000 mg by mouth daily.    . sertraline (ZOLOFT) 50 MG tablet TAKE 1 TABLET BY MOUTH DAILY (Patient taking differently: Take 50 mg by mouth daily. ) 90 tablet 1  . tamsulosin (FLOMAX) 0.4 MG CAPS capsule Take 1 capsule (0.4 mg total) by mouth daily. 90 capsule 3  . vitamin B-12 (CYANOCOBALAMIN) 1000 MCG tablet Take 1,000 mcg by mouth daily.    Marland Kitchen atorvastatin (LIPITOR) 80 MG tablet TAKE 1 TABLET(80 MG) BY MOUTH DAILY  AT 6 PM 90 tablet 0   Current Facility-Administered Medications  Medication Dose Route Frequency Provider Last Rate Last Admin  . levETIRAcetam (KEPPRA XR) 24 hr tablet 500 mg  500 mg Oral Daily Garvin Fila, MD       Facility-Administered Medications Ordered in Other Visits  Medication Dose Route Frequency Provider Last Rate Last Admin  . 0.9 %  sodium chloride infusion   Intravenous Continuous Derek Jack, MD 20 mL/hr at 12/30/18 1351 New Bag at 12/30/18 1351  . 0.9 %  sodium chloride infusion   Intravenous Continuous Derek Jack, MD 20 mL/hr at 12/30/18 1401 New Bag at  12/30/18 1401  . dextrose 5 % solution   Intravenous Once Derek Jack, MD      . heparin lock flush 100 unit/mL  500 Units Intracatheter Once PRN Derek Jack, MD      . sodium chloride flush (NS) 0.9 % injection 10 mL  10 mL Intracatheter PRN Derek Jack, MD   10 mL at 12/30/18 0911  . sodium chloride flush (NS) 0.9 % injection 10 mL  10 mL Intracatheter PRN Derek Jack, MD   10 mL at 05/21/19 0845    Past Medical History:  Diagnosis Date  . Cervical compression fracture ([place])   . Cervical spinal stenosis   . Diabetic neuropathy ([facility])    Bilateral legs  . Diverticulitis   . Family history of pancreatic cancer   . History of kidney stones   . Hypothyroidism   . Pancreatic cancer ([place])   . Stenosis of left vertebral artery   . Stroke (cerebrum) ([place]) 10/09/2019  . Stroke [facility])    2011  . Type 2 diabetes mellitus ([place])     Past Surgical History:  Procedure Laterality Date  . APPENDECTOMY    . BUBBLE STUDY N/A 11/18/2019   Procedure: BUBBLE STUDY;  Surgeon: Herminio Commons, MD;  Location: [facility];  Service: Cardiology;  Laterality: N/A;  . COLON RESECTION     For diverticulitis  . COLON SURGERY    . ESOPHAGOGASTRODUODENOSCOPY N/A 10/03/2018   Procedure: ESOPHAGOGASTRODUODENOSCOPY (EGD);  Surgeon: [name], MD;  Location: [name];  Service: Endoscopy;  Laterality: N/A;  . EUS N/A 10/03/2018   Procedure: UPPER ENDOSCOPIC ULTRASOUND (EUS) RADIAL;  Surgeon: [name], MD;  Location: [facility];  Service: Endoscopy;  Laterality: N/A;  . FINE NEEDLE ASPIRATION N/A 10/03/2018   Procedure: FINE NEEDLE ASPIRATION (FNA) LINEAR;  Surgeon: [name], MD;  Location: [facility];  Service: Endoscopy;  Laterality: N/A;  . IR ANGIO INTRA EXTRACRAN SEL COM CAROTID INNOMINATE BILAT MOD SED  01/21/2018  . IR ANGIO INTRA EXTRACRAN SEL COM CAROTID INNOMINATE UNI R MOD SED  03/21/2018  . IR ANGIO VERTEBRAL SEL VERTEBRAL BILAT MOD SED   01/21/2018  . IR TRANSCATH EXCRAN VERT OR CAR A STENT  03/21/2018  . PORTACATH PLACEMENT Left 12/23/2018   Procedure: INSERTION PORT-A-CATH;  Surgeon: Virl Cagey, MD;  Location: [facility];  Service: General;  Laterality: Left;  . RADIOLOGY WITH ANESTHESIA N/A 03/21/2018   Procedure: IR WITH ANESTHESIA WITH STENT PLACEMENT;  Surgeon: Luanne Bras, MD;  Location: [facility];  Service: Radiology;  Laterality: N/A;  . ROTATOR CUFF REPAIR     Left  . TEE WITHOUT CARDIOVERSION N/A 11/18/2019   Procedure: TRANSESOPHAGEAL ECHOCARDIOGRAM (TEE) WITH PROPOFOL;  Surgeon: Herminio Commons, MD;  Location: [facility];  Service: Cardiology;  Laterality: N/A;    Social History   Socioeconomic  History  . Marital status: Single    Spouse name: Not on file  . Number of children: 2  . Years of education: Not on file  . Highest education level: Not on file  Occupational History  . Occupation: Estate agent  Tobacco Use  . Smoking status: Former Smoker    Start date: 10/06/2019  . Smokeless tobacco: Never Used  Substance and Sexual Activity  . Alcohol use: Yes    Alcohol/week: 1.0 standard drinks    Types: 1 Cans of beer per week    Comment: rare   . Drug use: Never  . Sexual activity: Not on file  Other Topics Concern  . Not on file  Social History Narrative  . Not on file   Social Determinants of Health   Financial Resource Strain:   . Difficulty of Paying Living Expenses:   Food Insecurity:   . Worried About Charity fundraiser in the Last Year:   . Arboriculturist in the Last Year:   Transportation Needs:   . Film/video editor (Medical):   Marland Kitchen Lack of Transportation (Non-Medical):   Physical Activity:   . Days of Exercise per Week:   . Minutes of Exercise per Session:   Stress:   . Feeling of Stress :   Social Connections:   . Frequency of Communication with Friends and Family:   . Frequency of Social Gatherings with Friends and Family:   . Attends Religious Services:   .  Active Member of Clubs or Organizations:   . Attends Archivist Meetings:   Marland Kitchen Marital Status:   Intimate Partner Violence:   . Fear of Current or Ex-Partner:   . Emotionally Abused:   Marland Kitchen Physically Abused:   . Sexually Abused:      Vitals:   01/06/20 0845  BP: 134/68  Pulse: 88  Temp: 98.2 F (36.8 C)  SpO2: 97%  Weight: 165 lb (74.8 kg)  Height: 5\' 10"  (1.778 m)    Wt Readings from Last 3 Encounters:  01/06/20 165 lb (74.8 kg)  12/24/19 164 lb 3.2 oz (74.5 kg)  12/11/19 160 lb 9.6 oz (72.8 kg)     PHYSICAL EXAM General: NAD HEENT: Normal. Neck: No JVD, no thyromegaly. Lungs: Clear to auscultation bilaterally with normal respiratory effort. CV: Regular rate and rhythm, normal S1/S2, no S3/S4, no murmur. No pretibial or periankle edema.  No carotid bruit.   Abdomen: Soft, nontender, no distention.  Neurologic: Alert and oriented.  Psych: Normal affect. Skin: Normal. Musculoskeletal: No gross deformities.      Labs: Lab Results  Component Value Date/Time   K 3.6 12/18/2019 12:07 PM   BUN 18 12/18/2019 12:07 PM   CREATININE 0.73 12/18/2019 12:07 PM   CREATININE 0.81 09/30/2019 11:13 AM   ALT 14 12/18/2019 12:07 PM   ALT 19 12/11/2018 10:18 AM   TSH 1.41 09/30/2019 11:13 AM   HGB 15.0 12/18/2019 12:07 PM   HGB 14.9 12/11/2018 10:18 AM     Lipids: Lab Results  Component Value Date/Time   LDLCALC 123 (H) 10/09/2019 01:59 AM   CHOL 186 10/09/2019 01:59 AM   TRIG 105 10/09/2019 01:59 AM   HDL 42 10/09/2019 01:59 AM     Coronary CT angiography 12/29/2019:  1. Left Main:  No significant stenosis. FFR = 0.98  2. LAD: Significant stenosis. Proximal FFR = 0.77, Mid FFR = 0.69, Distal FFR = 0.57 3. LCX: Significant stenosis. Proximal FFR = 0.87, Distal FFR = <  0.50 4. RCA: Significant stenosis. Proximal FFR = 0.98, Mid FFR = 0.71, Distal FFR = 0.53  IMPRESSION: 1. CT FFR shows significant stenosis at proximal/mid/distal LAD, distal LCX, and  mid/distal RCA. Suggests three vessel CAD.   ASSESSMENT AND PLAN:  1.  CVA: MRI of the brain showed multiple, small bilateral frontal white matter and posterior right parietal lobe acute infarcts.  Etiology was deemed to be cardioembolic with some relation to hypercoagulable state from his cancer.  He denies palpitations.  Currently on clopidogrel and statin.  TEE was negative for cardiac source.  30-day event monitor was negative for atrial fibrillation and flutter and demonstrated sinus rhythm and sinus tachycardia.  2.  Hyperlipidemia: Lipids reviewed above.  Currently on atorvastatin 80 mg.  He appears to have three-vessel coronary disease and a history of CVA.  I will repeat lipids.  Goal LDL less than 70.  3.  Carotid artery occlusion: Carotid Dopplers on 10/09/2019 demonstrated chronic occlusion of left ICA and less than 50% stenosis of the right ICA.  There was patent antegrade vertebral flow bilaterally.  He is on clopidogrel and statin therapy.  4.  Chest pain and exertional dyspnea/coronary artery disease: Coronary CT angiography reviewed above suggestive of three-vessel coronary artery disease with significant stenosis at proximal/mid/distal LAD, distal LCX, and mid/distal RCA.  He is on statin and clopidogrel.  His symptoms appear to be his anginal equivalent. I will start metoprolol tartrate 25 mg twice daily.  I will prescribe sublingual nitroglycerin. Renal function is normal.  I will arrange for coronary angiography. Risks and benefits of cardiac catheterization have been discussed with the patient.  These include bleeding, infection, kidney damage, stroke, heart attack, death.  The patient understands these risks and is willing to proceed.   Disposition: Follow up in 1 month after cath  A high level of decision making was required for increased medical complexities.    Kate Sable, M.D., F.A.C.C.

## 2020-01-06 NOTE — Progress Notes (Signed)
SUBJECTIVE: The patient presents for follow-up to review cardiac testing.  TEE which I performed on 11/18/2019 demonstrated normal LV systolic function and regional wall motion, EF 60 to 65%.  There is no evidence of left atrial appendage thrombus nor evidence of an interatrial shunt with bubble study.  Coronary CT angiography on 12/29/2019 demonstrated significant stenosis of the proximal/mid/distal LAD, distal circumflex, and mid to distal RCA.  Fractional flow reserve was performed.  Event monitoring did not demonstrate any evidence of atrial fibrillation or flutter.  Past medical history includes left carotid artery occlusion, history of pancreatic cancer for which he underwent Whipple surgery in 2020 at [facility] and treated with chemotherapy, and hypertension.  He also had a stroke in 2011.  He has felt fatigued ever since his stroke in February.  He walks with a cane.  He continues to experience episodic precordial chest pain.  He denies palpitations.  He has significant exertional dyspnea.  He was short of breath and walking from the parking lot into our office.  This has gotten progressively worse.   Social history: He is originally from [place], [place].  His son lives there.  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  He likes playing country music and classic rock on guitar.  He also plays at his church and at [employer] in [place] on Wednesday nights.  Review of Systems: As per "subjective", otherwise negative.  Allergies  Allergen Reactions   Demerol [Meperidine Hcl] Other (See Comments)    convulsions    Current Outpatient Medications  Medication Sig Dispense Refill   clopidogrel (PLAVIX) 75 MG tablet Take 1 tablet (75 mg total) by mouth daily. 60 tablet 1   ECHINACEA EXTRACT PO Take 2 tablets by mouth daily.      gabapentin (NEURONTIN) 300 MG capsule Take 1 capsule (300 mg total) by mouth 2 (two) times daily. 180 capsule 0   insulin aspart  (NOVOLOG) 100 UNIT/ML FlexPen Inject 6 Units into the skin 3 (three) times daily with meals. 15 mL 0   insulin glargine (LANTUS SOLOSTAR) 100 UNIT/ML Solostar Pen Inject 36 Units into the skin at bedtime. 5 pen 2   Insulin Pen Needle 32G X 4 MM MISC 1 Device by Does not apply route daily. 50 each 2   lidocaine-prilocaine (EMLA) cream Apply 1 application topically daily as needed (prior to port being accessed (~every 3 months)).     loperamide (IMODIUM A-D) 2 MG tablet Take 1 tablet (2 mg total) by mouth as needed. Take 2 at diarrhea onset , then 1 every 2hr until 12hrs with no BM. May take 2 every 4hrs at night. If diarrhea recurs repeat. 100 tablet 1   meclizine (ANTIVERT) 25 MG tablet Take 1 tablet (25 mg total) by mouth 3 (three) times daily as needed for dizziness. 30 tablet 0   NP THYROID 30 MG tablet Take 30 mg by mouth daily before breakfast.   2   Omega-3 1000 MG CAPS Take 1,000 mg by mouth daily.     sertraline (ZOLOFT) 50 MG tablet TAKE 1 TABLET BY MOUTH DAILY (Patient taking differently: Take 50 mg by mouth daily. ) 90 tablet 1   tamsulosin (FLOMAX) 0.4 MG CAPS capsule Take 1 capsule (0.4 mg total) by mouth daily. 90 capsule 3   vitamin B-12 (CYANOCOBALAMIN) 1000 MCG tablet Take 1,000 mcg by mouth daily.     atorvastatin (LIPITOR) 80 MG tablet TAKE 1 TABLET(80 MG) BY MOUTH DAILY  AT 6 PM 90 tablet 0   Current Facility-Administered Medications  Medication Dose Route Frequency Provider Last Rate Last Admin   levETIRAcetam (KEPPRA XR) 24 hr tablet 500 mg  500 mg Oral Daily Garvin Fila, MD       Facility-Administered Medications Ordered in Other Visits  Medication Dose Route Frequency Provider Last Rate Last Admin   0.9 %  sodium chloride infusion   Intravenous Continuous Derek Jack, MD 20 mL/hr at 12/30/18 1351 New Bag at 12/30/18 1351   0.9 %  sodium chloride infusion   Intravenous Continuous Derek Jack, MD 20 mL/hr at 12/30/18 1401 New Bag at  12/30/18 1401   dextrose 5 % solution   Intravenous Once Derek Jack, MD       heparin lock flush 100 unit/mL  500 Units Intracatheter Once PRN Derek Jack, MD       sodium chloride flush (NS) 0.9 % injection 10 mL  10 mL Intracatheter PRN Derek Jack, MD   10 mL at 12/30/18 0911   sodium chloride flush (NS) 0.9 % injection 10 mL  10 mL Intracatheter PRN Derek Jack, MD   10 mL at 05/21/19 0845    Past Medical History:  Diagnosis Date   Cervical compression fracture ([place])    Cervical spinal stenosis    Diabetic neuropathy ([place])    Bilateral legs   Diverticulitis    Family history of pancreatic cancer    History of kidney stones    Hypothyroidism    Pancreatic cancer ([place])    Stenosis of left vertebral artery    Stroke (cerebrum) ([place]) 10/09/2019   Stroke ([place])    2011   Type 2 diabetes mellitus ([place])     Past Surgical History:  Procedure Laterality Date   APPENDECTOMY     BUBBLE STUDY N/A 11/18/2019   Procedure: BUBBLE STUDY;  Surgeon: Herminio Commons, MD;  Location: [facility];  Service: Cardiology;  Laterality: N/A;   COLON RESECTION     For diverticulitis   COLON SURGERY     ESOPHAGOGASTRODUODENOSCOPY N/A 10/03/2018   Procedure: ESOPHAGOGASTRODUODENOSCOPY (EGD);  Surgeon: [name], MD;  Location: [name];  Service: Endoscopy;  Laterality: N/A;   EUS N/A 10/03/2018   Procedure: UPPER ENDOSCOPIC ULTRASOUND (EUS) RADIAL;  Surgeon: [name], MD;  [facility];  Service: Endoscopy;  Laterality: N/A;   FINE NEEDLE ASPIRATION N/A 10/03/2018   Procedure: FINE NEEDLE ASPIRATION (FNA) LINEAR;  Surgeon: [name], MD;  [facility];  Service: Endoscopy;  Laterality: N/A;   IR ANGIO INTRA EXTRACRAN SEL COM CAROTID INNOMINATE BILAT MOD SED  01/21/2018   IR ANGIO INTRA EXTRACRAN SEL COM CAROTID INNOMINATE UNI R MOD SED  03/21/2018   IR ANGIO VERTEBRAL SEL VERTEBRAL BILAT MOD SED   01/21/2018   IR TRANSCATH EXCRAN VERT OR CAR A STENT  03/21/2018   PORTACATH PLACEMENT Left 12/23/2018   Procedure: INSERTION PORT-A-CATH;  Surgeon: Virl Cagey, MD;  Location: [facility];  Service: General;  Laterality: Left;   RADIOLOGY WITH ANESTHESIA N/A 03/21/2018   Procedure: IR WITH ANESTHESIA WITH STENT PLACEMENT;  Surgeon: Luanne Bras, MD;  Location: [facility];  Service: Radiology;  Laterality: N/A;   ROTATOR CUFF REPAIR     Left   TEE WITHOUT CARDIOVERSION N/A 11/18/2019   Procedure: TRANSESOPHAGEAL ECHOCARDIOGRAM (TEE) WITH PROPOFOL;  Surgeon: Herminio Commons, MD;  Location: [facility];  Service: Cardiology;  Laterality: N/A;    Social History   Socioeconomic  History   Marital status: Single    Spouse name: Not on file   Number of children: 2   Years of education: Not on file   Highest education level: Not on file  Occupational History   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  Tobacco Use   Smoking status: Former Smoker    Start date: 10/06/2019   Smokeless tobacco: Never Used  Substance and Sexual Activity   Alcohol use: Yes    Alcohol/week: 1.0 standard drinks    Types: 1 Cans of beer per week    Comment: rare    Drug use: Never   Sexual activity: Not on file  Other Topics Concern   Not on file  Social History Narrative   Not on file   Social Determinants of Health   Financial Resource Strain:    Difficulty of Paying Living Expenses:   Food Insecurity:    Worried About Charity fundraiser in the Last Year:    Arboriculturist in the Last Year:   Transportation Needs:    Film/video editor (Medical):    Lack of Transportation (Non-Medical):   Physical Activity:    Days of Exercise per Week:    Minutes of Exercise per Session:   Stress:    Feeling of Stress :   Social Connections:    Frequency of Communication with Friends and Family:    Frequency of Social Gatherings with Friends and Family:    Attends Religious Services:     Active Member of Clubs or Organizations:    Attends Archivist Meetings:    Marital Status:   Intimate Partner Violence:    Fear of Current or Ex-Partner:    Emotionally Abused:    Physically Abused:    Sexually Abused:      Vitals:   01/06/20 0845  BP: 134/68  Pulse: 88  Temp: 98.2 F (36.8 C)  SpO2: 97%  Weight: 165 lb (74.8 kg)  Height: 5\' 10"  (1.778 m)    Wt Readings from Last 3 Encounters:  01/06/20 165 lb (74.8 kg)  12/24/19 164 lb 3.2 oz (74.5 kg)  12/11/19 160 lb 9.6 oz (72.8 kg)     PHYSICAL EXAM General: NAD HEENT: Normal. Neck: No JVD, no thyromegaly. Lungs: Clear to auscultation bilaterally with normal respiratory effort. CV: Regular rate and rhythm, normal S1/S2, no S3/S4, no murmur. No pretibial or periankle edema.  No carotid bruit.   Abdomen: Soft, nontender, no distention.  Neurologic: Alert and oriented.  Psych: Normal affect. Skin: Normal. Musculoskeletal: No gross deformities.      Labs: Lab Results  Component Value Date/Time   K 3.6 12/18/2019 12:07 PM   BUN 18 12/18/2019 12:07 PM   CREATININE 0.73 12/18/2019 12:07 PM   CREATININE 0.81 09/30/2019 11:13 AM   ALT 14 12/18/2019 12:07 PM   ALT 19 12/11/2018 10:18 AM   TSH 1.41 09/30/2019 11:13 AM   HGB 15.0 12/18/2019 12:07 PM   HGB 14.9 12/11/2018 10:18 AM     Lipids: Lab Results  Component Value Date/Time   LDLCALC 123 (H) 10/09/2019 01:59 AM   CHOL 186 10/09/2019 01:59 AM   TRIG 105 10/09/2019 01:59 AM   HDL 42 10/09/2019 01:59 AM     Coronary CT angiography 12/29/2019:  1. Left Main:  No significant stenosis. FFR = 0.98  2. LAD: Significant stenosis. Proximal FFR = 0.77, Mid FFR = 0.69, Distal FFR = 0.57 3. LCX: Significant stenosis. Proximal FFR = 0.87, Distal FFR = <  0.50 4. RCA: Significant stenosis. Proximal FFR = 0.98, Mid FFR = 0.71, Distal FFR = 0.53  IMPRESSION: 1. CT FFR shows significant stenosis at proximal/mid/distal LAD, distal LCX, and  mid/distal RCA. Suggests three vessel CAD.   ASSESSMENT AND PLAN:  1.  CVA: MRI of the brain showed multiple, small bilateral frontal white matter and posterior right parietal lobe acute infarcts.  Etiology was deemed to be cardioembolic with some relation to hypercoagulable state from his cancer.  He denies palpitations.  Currently on clopidogrel and statin.  TEE was negative for cardiac source.  30-day event monitor was negative for atrial fibrillation and flutter and demonstrated sinus rhythm and sinus tachycardia.  2.  Hyperlipidemia: Lipids reviewed above.  Currently on atorvastatin 80 mg.  He appears to have three-vessel coronary disease and a history of CVA.  I will repeat lipids.  Goal LDL less than 70.  3.  Carotid artery occlusion: Carotid Dopplers on 10/09/2019 demonstrated chronic occlusion of left ICA and less than 50% stenosis of the right ICA.  There was patent antegrade vertebral flow bilaterally.  He is on clopidogrel and statin therapy.  4.  Chest pain and exertional dyspnea/coronary artery disease: Coronary CT angiography reviewed above suggestive of three-vessel coronary artery disease with significant stenosis at proximal/mid/distal LAD, distal LCX, and mid/distal RCA.  He is on statin and clopidogrel.  His symptoms appear to be his anginal equivalent. I will start metoprolol tartrate 25 mg twice daily.  I will prescribe sublingual nitroglycerin. Renal function is normal.  I will arrange for coronary angiography. Risks and benefits of cardiac catheterization have been discussed with the patient.  These include bleeding, infection, kidney damage, stroke, heart attack, death.  The patient understands these risks and is willing to proceed.   Disposition: Follow up in 1 month after cath  A high level of decision making was required for increased medical complexities.    Kate Sable, M.D., F.A.C.C.

## 2020-01-06 NOTE — Telephone Encounter (Signed)
Per lipid Lab results regarding cholesterol, " If he is taking Atorvastatin 80 mg daily, please add Ezetimibe 10 mg daily and repeat lipids in 3 months."per Dr.Koneswaran   I spoke with patient, he wants 30 day supply to local pharmacy.I will mail lab slip to him, copied pcp

## 2020-01-06 NOTE — Patient Instructions (Addendum)
    Mark Foster [id number] Dept: [phone] Loc: [place]  01/06/2020  You are scheduled for a Cardiac Catheterization on Thursday, May 6 with Dr. Glenetta Hew.  1. Please arrive at the [facility] (Main Entrance A) at [facility]: [address] [place], [place] [address] at 7:00 AM (This time is two hours before your procedure to ensure your preparation). Free valet parking service is available.   Special note: Every effort is made to have your procedure done on time. Please understand that emergencies sometimes delay scheduled procedures.  2. Diet: Do not eat solid foods after midnight.  The patient may have clear liquids until 5am upon the day of the procedure.  3. Labs: You will need to have blood drawn on TODAY, COVID  After labs   4. Medication instructions in preparation for your procedure:   Contrast Allergy: No   *For reference purposes while preparing patient instructions.   Delete this med list prior to printing instructions for patient.*      Take only 18 units of insulin the night before your procedure. Do not take any insulin on the day of the procedure.  START Lopressor 25 mg twice a day  On the morning of your procedure, take your Aspirin and any morning medicines NOT listed above.  You may use sips of water.  5. Plan for one night stay--bring personal belongings. 6. Bring a current list of your medications and current insurance cards. 7. You MUST have a responsible person to drive you home. 8. Someone MUST be with you the first 24 hours after you arrive home or your discharge will be delayed. 9. Please wear clothes that are easy to get on and off and wear slip-on shoes.  Thank you for allowing [place] to care for you!   -- [facility]

## 2020-01-06 NOTE — Telephone Encounter (Signed)
-----   Message from Herminio Commons, MD sent at 01/06/2020 10:47 AM EDT ----- normal

## 2020-01-07 ENCOUNTER — Telehealth: Payer: Self-pay | Admitting: *Deleted

## 2020-01-07 ENCOUNTER — Ambulatory Visit (HOSPITAL_COMMUNITY)
Admission: RE | Admit: 2020-01-07 | Discharge: 2020-01-07 | Disposition: A | Discharge status: Home / Self Care | Payer: Medicare HMO | Source: Ambulatory Visit | Attending: Interventional Radiology | Admitting: Interventional Radiology

## 2020-01-07 ENCOUNTER — Ambulatory Visit (HOSPITAL_COMMUNITY): Payer: Medicare HMO

## 2020-01-07 DIAGNOSIS — I6523 Occlusion and stenosis of bilateral carotid arteries: Secondary | ICD-10-CM | POA: Diagnosis not present

## 2020-01-07 DIAGNOSIS — I672 Cerebral atherosclerosis: Secondary | ICD-10-CM | POA: Diagnosis not present

## 2020-01-07 DIAGNOSIS — I639 Cerebral infarction, unspecified: Secondary | ICD-10-CM | POA: Insufficient documentation

## 2020-01-07 LAB — SARS CORONAVIRUS 2 (TAT 6-24 HRS): SARS Coronavirus 2: NEGATIVE

## 2020-01-07 NOTE — Telephone Encounter (Signed)
Pt contacted pre-catheterization scheduled at [facility] for: Thursday Jan 08, 2020 9 AM Verified arrival time and place: [facility]) at: 7 AM   No solid food after midnight prior to cath, clear liquids until 5 AM day of procedure.  Hold: Insulin-AM of procedure Insulin-1/2 usual dose HS prior to procedure.  Except hold medications AM meds can be  taken pre-cath with sip of water including: ASA 81 mg Plavix 75 mg  Confirmed patient has responsible adult to drive home post procedure and observe 24 hours after arriving home: yes  You are allowed ONE visitor in the waiting room during your procedures. Both you and your visitor must wear masks.      COVID-19 Pre-Screening Questions:  . In the past 7 to 10 days have you had a cough,  shortness of breath, headache, congestion, fever (100 or greater) body aches, chills, sore throat, or sudden loss of taste or sense of smell? Shortness of breath . Have you been around anyone with known Covid 19 in the past 7 to 10 days? no . Have you been around anyone who is awaiting Covid 19 test results in the past 7 to 10 days? no . Have you been around anyone who has mentioned symptoms of Covid 19 within the past 7 to 10 days? No  Reviewed procedure/mask/visitor instructions, COVID-19 screening questions with patient

## 2020-01-08 ENCOUNTER — Other Ambulatory Visit: Payer: Self-pay

## 2020-01-08 ENCOUNTER — Ambulatory Visit (HOSPITAL_COMMUNITY)
Admission: RE | Admit: 2020-01-08 | Discharge: 2020-01-08 | Disposition: A | Discharge status: Home / Self Care | Payer: Medicare HMO | Attending: Cardiology | Admitting: Cardiology

## 2020-01-08 ENCOUNTER — Encounter (HOSPITAL_COMMUNITY)
Admission: RE | Disposition: A | Discharge status: Home / Self Care | Payer: Self-pay | Source: Home / Self Care | Attending: Cardiology

## 2020-01-08 DIAGNOSIS — I25110 Atherosclerotic heart disease of native coronary artery with unstable angina pectoris: Secondary | ICD-10-CM

## 2020-01-08 DIAGNOSIS — Z95828 Presence of other vascular implants and grafts: Secondary | ICD-10-CM | POA: Insufficient documentation

## 2020-01-08 DIAGNOSIS — I6523 Occlusion and stenosis of bilateral carotid arteries: Secondary | ICD-10-CM | POA: Diagnosis not present

## 2020-01-08 DIAGNOSIS — I10 Essential (primary) hypertension: Secondary | ICD-10-CM | POA: Diagnosis not present

## 2020-01-08 DIAGNOSIS — Z7902 Long term (current) use of antithrombotics/antiplatelets: Secondary | ICD-10-CM | POA: Diagnosis not present

## 2020-01-08 DIAGNOSIS — Z79890 Hormone replacement therapy (postmenopausal): Secondary | ICD-10-CM | POA: Insufficient documentation

## 2020-01-08 DIAGNOSIS — Z79899 Other long term (current) drug therapy: Secondary | ICD-10-CM | POA: Diagnosis not present

## 2020-01-08 DIAGNOSIS — Z794 Long term (current) use of insulin: Secondary | ICD-10-CM | POA: Diagnosis not present

## 2020-01-08 DIAGNOSIS — I209 Angina pectoris, unspecified: Secondary | ICD-10-CM

## 2020-01-08 DIAGNOSIS — I200 Unstable angina: Secondary | ICD-10-CM | POA: Diagnosis present

## 2020-01-08 DIAGNOSIS — I2584 Coronary atherosclerosis due to calcified coronary lesion: Secondary | ICD-10-CM | POA: Insufficient documentation

## 2020-01-08 DIAGNOSIS — Z885 Allergy status to narcotic agent status: Secondary | ICD-10-CM | POA: Insufficient documentation

## 2020-01-08 DIAGNOSIS — Z8673 Personal history of transient ischemic attack (TIA), and cerebral infarction without residual deficits: Secondary | ICD-10-CM | POA: Diagnosis not present

## 2020-01-08 DIAGNOSIS — E785 Hyperlipidemia, unspecified: Secondary | ICD-10-CM | POA: Diagnosis not present

## 2020-01-08 DIAGNOSIS — E1140 Type 2 diabetes mellitus with diabetic neuropathy, unspecified: Secondary | ICD-10-CM | POA: Insufficient documentation

## 2020-01-08 DIAGNOSIS — R931 Abnormal findings on diagnostic imaging of heart and coronary circulation: Secondary | ICD-10-CM | POA: Diagnosis present

## 2020-01-08 DIAGNOSIS — E039 Hypothyroidism, unspecified: Secondary | ICD-10-CM | POA: Insufficient documentation

## 2020-01-08 DIAGNOSIS — Z87891 Personal history of nicotine dependence: Secondary | ICD-10-CM | POA: Diagnosis not present

## 2020-01-08 HISTORY — PX: LEFT HEART CATH AND CORONARY ANGIOGRAPHY: CATH118249

## 2020-01-08 LAB — GLUCOSE, CAPILLARY: Glucose-Capillary: 223 mg/dL — ABNORMAL HIGH (ref 70–99)

## 2020-01-08 SURGERY — LEFT HEART CATH AND CORONARY ANGIOGRAPHY
Anesthesia: LOCAL

## 2020-01-08 MED ORDER — ASPIRIN 81 MG PO CHEW: 81.0000 mg | CHEWABLE_TABLET | ORAL | Status: DC

## 2020-01-08 MED ORDER — SODIUM CHLORIDE 0.9 % IV SOLN: INTRAVENOUS | Status: DC

## 2020-01-08 MED ORDER — HYDRALAZINE HCL 20 MG/ML IJ SOLN: 10.0000 mg | INTRAMUSCULAR | Status: DC | PRN

## 2020-01-08 MED ORDER — LIDOCAINE HCL (PF) 1 % IJ SOLN
INTRAMUSCULAR | Status: AC
  Filled 2020-01-08: qty 30

## 2020-01-08 MED ORDER — MIDAZOLAM HCL 2 MG/2ML IJ SOLN
INTRAMUSCULAR | Status: DC | PRN
  Administered 2020-01-08: 2 mg via INTRAVENOUS

## 2020-01-08 MED ORDER — ACETAMINOPHEN 325 MG PO TABS
650.0000 mg | ORAL_TABLET | ORAL | Status: DC | PRN
  Administered 2020-01-08: 650 mg via ORAL
  Filled 2020-01-08: qty 2

## 2020-01-08 MED ORDER — SODIUM CHLORIDE 0.9 % IV SOLN: 250.0000 mL | INTRAVENOUS | Status: DC | PRN

## 2020-01-08 MED ORDER — HEPARIN (PORCINE) IN NACL 1000-0.9 UT/500ML-% IV SOLN
INTRAVENOUS | Status: DC | PRN
  Administered 2020-01-08 (×2): 500 mL

## 2020-01-08 MED ORDER — HEPARIN SODIUM (PORCINE) 1000 UNIT/ML IJ SOLN
INTRAMUSCULAR | Status: AC
  Filled 2020-01-08: qty 1

## 2020-01-08 MED ORDER — MIDAZOLAM HCL 2 MG/2ML IJ SOLN
INTRAMUSCULAR | Status: AC
  Filled 2020-01-08: qty 2

## 2020-01-08 MED ORDER — LABETALOL HCL 5 MG/ML IV SOLN: 10.0000 mg | INTRAVENOUS | Status: DC | PRN

## 2020-01-08 MED ORDER — HEPARIN SODIUM (PORCINE) 1000 UNIT/ML IJ SOLN
INTRAMUSCULAR | Status: DC | PRN
  Administered 2020-01-08: 4000 [IU] via INTRAVENOUS

## 2020-01-08 MED ORDER — HEPARIN (PORCINE) IN NACL 1000-0.9 UT/500ML-% IV SOLN
INTRAVENOUS | Status: AC
  Filled 2020-01-08: qty 500

## 2020-01-08 MED ORDER — ONDANSETRON HCL 4 MG/2ML IJ SOLN: 4.0000 mg | Freq: Four times a day (QID) | INTRAMUSCULAR | Status: DC | PRN

## 2020-01-08 MED ORDER — SODIUM CHLORIDE 0.9% FLUSH: 3.0000 mL | INTRAVENOUS | Status: DC | PRN

## 2020-01-08 MED ORDER — SODIUM CHLORIDE 0.9% FLUSH: 3.0000 mL | Freq: Two times a day (BID) | INTRAVENOUS | Status: DC

## 2020-01-08 MED ORDER — MORPHINE SULFATE (PF) 2 MG/ML IV SOLN: 1.0000 mg | INTRAVENOUS | Status: DC | PRN

## 2020-01-08 MED ORDER — VERAPAMIL HCL 2.5 MG/ML IV SOLN
INTRAVENOUS | Status: AC
  Filled 2020-01-08: qty 2

## 2020-01-08 MED ORDER — FENTANYL CITRATE (PF) 100 MCG/2ML IJ SOLN
INTRAMUSCULAR | Status: AC
  Filled 2020-01-08: qty 2

## 2020-01-08 MED ORDER — HEPARIN (PORCINE) IN NACL 1000-0.9 UT/500ML-% IV SOLN
INTRAVENOUS | Status: AC
  Filled 2020-01-08: qty 1000

## 2020-01-08 MED ORDER — SODIUM CHLORIDE 0.9 % WEIGHT BASED INFUSION
3.0000 mL/kg/h | INTRAVENOUS | Status: AC
  Administered 2020-01-08: 3 mL/kg/h via INTRAVENOUS

## 2020-01-08 MED ORDER — VERAPAMIL HCL 2.5 MG/ML IV SOLN
INTRAVENOUS | Status: DC | PRN
  Administered 2020-01-08: 09:00:00 10 mL via INTRA_ARTERIAL

## 2020-01-08 MED ORDER — IOHEXOL 350 MG/ML SOLN
INTRAVENOUS | Status: DC | PRN
  Administered 2020-01-08: 55 mL

## 2020-01-08 MED ORDER — FENTANYL CITRATE (PF) 100 MCG/2ML IJ SOLN
INTRAMUSCULAR | Status: DC | PRN
  Administered 2020-01-08: 25 ug via INTRAVENOUS

## 2020-01-08 MED ORDER — LIDOCAINE HCL (PF) 1 % IJ SOLN
INTRAMUSCULAR | Status: DC | PRN
  Administered 2020-01-08: 2 mL

## 2020-01-08 MED ORDER — SODIUM CHLORIDE 0.9 % WEIGHT BASED INFUSION: 1.0000 mL/kg/h | INTRAVENOUS | Status: DC

## 2020-01-08 SURGICAL SUPPLY — 9 items
CATH OPTITORQUE TIG 4.0 5F (CATHETERS) ×2 IMPLANT
DEVICE RAD COMP TR BAND LRG (VASCULAR PRODUCTS) ×4 IMPLANT
GLIDESHEATH SLEND SS 6F .021 (SHEATH) ×2 IMPLANT
GUIDEWIRE INQWIRE 1.5J.035X260 (WIRE) ×1 IMPLANT
INQWIRE 1.5J .035X260CM (WIRE) ×2
KIT HEART LEFT (KITS) ×2 IMPLANT
PACK CARDIAC CATHETERIZATION (CUSTOM PROCEDURE TRAY) ×2 IMPLANT
TRANSDUCER W/STOPCOCK (MISCELLANEOUS) ×2 IMPLANT
TUBING CIL FLEX 10 FLL-RA (TUBING) ×2 IMPLANT

## 2020-01-08 NOTE — Progress Notes (Signed)
TCTS consulted for outpatient CABG evaluation. TCTS office will call pt with an appointment. 

## 2020-01-08 NOTE — Interval H&P Note (Signed)
History and Physical Interval Note:  01/08/2020 8:29 AM  Mark Foster  has presented today for surgery, with the diagnosis of CAD - Abnormal Coronary CT Angiogram.  The various methods of treatment have been discussed with the patient and family. After consideration of risks, benefits and other options for treatment, the patient has consented to  Procedure(s): LEFT HEART CATH AND CORONARY ANGIOGRAPHY (N/A) as a surgical intervention.  The patient's history has been reviewed, patient examined, no change in status, stable for surgery.  I have reviewed the patient's chart and labs.  Questions were answered to the patient's satisfaction.    Cath Lab Visit (complete for each Cath Lab visit)  Clinical Evaluation Leading to the Procedure:   ACS: No.  Non-ACS:    Anginal Classification: CCS II  Anti-ischemic medical therapy: No Therapy  Non-Invasive Test Results: High-risk stress test findings: cardiac mortality >3%/year  Prior CABG: No previous CABG   Glenetta Hew

## 2020-01-08 NOTE — Discharge Instructions (Signed)
Radial Site Care  This sheet gives you information about how to care for yourself after your procedure. Your health care provider may also give you more specific instructions. If you have problems or questions, contact your health care provider. What can I expect after the procedure? After the procedure, it is common to have:  Bruising and tenderness at the catheter insertion area. Follow these instructions at home: Medicines  Take over-the-counter and prescription medicines only as told by your health care provider. Insertion site care  Follow instructions from your health care provider about how to take care of your insertion site. Make sure you: ? Wash your hands with soap and water before you change your bandage (dressing). If soap and water are not available, use hand sanitizer. ? Change your dressing as told by your health care provider. ? Leave stitches (sutures), skin glue, or adhesive strips in place. These skin closures may need to stay in place for 2 weeks or longer. If adhesive strip edges start to loosen and curl up, you may trim the loose edges. Do not remove adhesive strips completely unless your health care provider tells you to do that.  Check your insertion site every day for signs of infection. Check for: ? Redness, swelling, or pain. ? Fluid or blood. ? Pus or a bad smell. ? Warmth.  Do not take baths, swim, or use a hot tub until your health care provider approves.  You may shower 24-48 hours after the procedure, or as directed by your health care provider. ? Remove the dressing and gently wash the site with plain soap and water. ? Pat the area dry with a clean towel. ? Do not rub the site. That could cause bleeding.  Do not apply powder or lotion to the site. Activity   For 24 hours after the procedure, or as directed by your health care provider: ? Do not flex or bend the affected arm. ? Do not push or pull heavy objects with the affected arm. ? Do not  drive yourself home from the hospital or clinic. You may drive 24 hours after the procedure unless your health care provider tells you not to. ? Do not operate machinery or power tools.  Do not lift anything that is heavier than 10 lb (4.5 kg), or the limit that you are told, until your health care provider says that it is safe.  Ask your health care provider when it is okay to: ? Return to work or school. ? Resume usual physical activities or sports. ? Resume sexual activity. General instructions  If the catheter site starts to bleed, raise your arm and put firm pressure on the site. If the bleeding does not stop, get help right away. This is a medical emergency.  If you went home on the same day as your procedure, a responsible adult should be with you for the first 24 hours after you arrive home.  Keep all follow-up visits as told by your health care provider. This is important. Contact a health care provider if:  You have a fever.  You have redness, swelling, or yellow drainage around your insertion site. Get help right away if:  You have unusual pain at the radial site.  The catheter insertion area swells very fast.  The insertion area is bleeding, and the bleeding does not stop when you hold steady pressure on the area.  Your arm or hand becomes pale, cool, tingly, or numb. These symptoms may represent a serious problem   that is an emergency. Do not wait to see if the symptoms will go away. Get medical help right away. Call your local emergency services (911 in the [place] Do not drive yourself to the hospital. Summary  After the procedure, it is common to have bruising and tenderness at the site.  Follow instructions from your health care provider about how to take care of your radial site wound. Check the wound every day for signs of infection.  Do not lift anything that is heavier than 10 lb (4.5 kg), or the limit that you are told, until your health care provider says  that it is safe. This information is not intended to replace advice given to you by your health care provider. Make sure you discuss any questions you have with your health care provider. Document Revised: 09/26/2017 Document Reviewed: 09/26/2017 Elsevier Patient Education  2020 Elsevier Inc.  

## 2020-01-09 NOTE — Progress Notes (Signed)
Kindly inform the patient that EEG study was normal

## 2020-01-12 ENCOUNTER — Telehealth: Payer: Self-pay

## 2020-01-12 ENCOUNTER — Telehealth (HOSPITAL_COMMUNITY): Payer: Self-pay

## 2020-01-12 ENCOUNTER — Telehealth (INDEPENDENT_AMBULATORY_CARE_PROVIDER_SITE_OTHER): Payer: Self-pay

## 2020-01-12 ENCOUNTER — Other Ambulatory Visit (INDEPENDENT_AMBULATORY_CARE_PROVIDER_SITE_OTHER): Payer: Self-pay | Admitting: Internal Medicine

## 2020-01-12 ENCOUNTER — Other Ambulatory Visit (HOSPITAL_COMMUNITY): Payer: Self-pay

## 2020-01-12 MED ORDER — MECLIZINE HCL 25 MG PO TABS: 25.0000 mg | ORAL_TABLET | Freq: Three times a day (TID) | ORAL | 0 refills | Status: DC | PRN

## 2020-01-12 MED ORDER — LEVOTHYROXINE SODIUM 50 MCG PO TABS: 50.0000 ug | ORAL_TABLET | Freq: Every day | ORAL | 1 refills | Status: DC

## 2020-01-12 MED ORDER — LEVOTHYROXINE SODIUM 50 MCG PO TABS
50.0000 ug | ORAL_TABLET | Freq: Every day | ORAL | 1 refills | Status: DC
Start: 2020-01-12 — End: 2020-01-12

## 2020-01-12 NOTE — Telephone Encounter (Signed)
Routing to [name] for advice?

## 2020-01-12 NOTE — Telephone Encounter (Signed)
Pt stopped by to advise that [employer] does not know what Thyroid NP 0.5 GR (30mg ) is , and he wants to know is there something else that he can take that the insurance covers.

## 2020-01-12 NOTE — Telephone Encounter (Signed)
Pt agreed to f/u in 6 months with cta head/neck. He is getting ready to have bypass surgery in the next couple weeks. AW

## 2020-01-12 NOTE — Telephone Encounter (Signed)
Garvin Fila, MD  01/09/2020 1:53 PM EDT    Kindly inform the patient that EEG study was normal    Pt notified of results and verbalized understanding.

## 2020-01-12 NOTE — Progress Notes (Signed)
Mark Foster is now aware of the results he states that he has an appointment at the Cardiologist tomorrow and they are going to do bypass surgery on Mark Foster they tried to put in stents but they couldn't get through the blockages

## 2020-01-12 NOTE — Telephone Encounter (Signed)
[name] asked if you would send Rx for Levothyroxine to [facility]

## 2020-01-12 NOTE — Telephone Encounter (Signed)
Most insurance companies do not pay for NP thyroid and he would have to get this out of his own pocket, if he uses good Rx coupon, it is not very expensive. If he does not want to do this, we can prescribe the levothyroxine that his pharmacy will pay for although it is not the optimal treatment.  Let me know what he wants to do.

## 2020-01-13 ENCOUNTER — Encounter: Payer: Self-pay | Admitting: Thoracic Surgery (Cardiothoracic Vascular Surgery)

## 2020-01-13 ENCOUNTER — Other Ambulatory Visit: Payer: Self-pay

## 2020-01-13 ENCOUNTER — Institutional Professional Consult (permissible substitution) (INDEPENDENT_AMBULATORY_CARE_PROVIDER_SITE_OTHER): Payer: Medicare HMO | Admitting: Thoracic Surgery (Cardiothoracic Vascular Surgery)

## 2020-01-13 ENCOUNTER — Other Ambulatory Visit: Payer: Self-pay | Admitting: *Deleted

## 2020-01-13 VITALS — BP 125/74 | HR 80 | Temp 97.9°F | Resp 20 | Ht 70.0 in | Wt 164.0 lb

## 2020-01-13 DIAGNOSIS — I209 Angina pectoris, unspecified: Secondary | ICD-10-CM

## 2020-01-13 DIAGNOSIS — I2510 Atherosclerotic heart disease of native coronary artery without angina pectoris: Secondary | ICD-10-CM

## 2020-01-13 DIAGNOSIS — I6522 Occlusion and stenosis of left carotid artery: Secondary | ICD-10-CM

## 2020-01-13 DIAGNOSIS — Z8673 Personal history of transient ischemic attack (TIA), and cerebral infarction without residual deficits: Secondary | ICD-10-CM | POA: Diagnosis not present

## 2020-01-13 NOTE — Progress Notes (Signed)
PCP is Ailene Ards, NP Referring Provider is Leonie Man, MD  Chief Complaint  Patient presents with  . Coronary Artery Disease    eval fo CABG...ECHO 11/18/19, CATH 01/08/20, CAROTIDS 10/09/19    HPI: Mr. Livaudais is sent for consultation regarding three-vessel coronary disease.  Trebor Walcutt is a 64 year old man with a complex medical history including a stroke in 2011 and more recently in February 2021, left internal carotid occlusion, pancreatic cancer status post distal pancreatectomy and splenectomy, type 2 insulin-dependent diabetes complicated by diabetic neuropathy, hypothyroidism, cervical spinal stenosis.    He was admitted in February after a syncopal spell.  He was found to have multiple small strokes.  He had an extensive work-up including TEE with a bubble study which showed normal LV function and no significant valvular pathology.  He had carotid Dopplers which showed a chronic total occlusion of the left ICA and less than 50% narrowing of the right ICA.  CT coronary angiography on 12/29/1999 showed three-vessel disease.  He had a 30-day event monitor with no evidence of atrial fibrillation or flutter.  He had cardiac catheterization on 01/08/2020 which showed three-vessel coronary disease.  FFR confirmed hemodynamic significance in the LAD and circumflex.  He has been having some chest pressure.  He says this will come on sporadically.  Sometimes occurs at rest.  He does not get it with a consistent degree of exercise.  He still has some difficulty with balance.  He has walks with a cane for several years.  He has not had any other syncopal episodes.   Past Medical History:  Diagnosis Date  . Cervical compression fracture ([place])   . Cervical spinal stenosis   . Diabetic neuropathy ([facility])    Bilateral legs  . Diverticulitis   . Family history of pancreatic cancer   . History of kidney stones   . Hypothyroidism   . Pancreatic cancer ([place])   . Stenosis of left vertebral artery    . Stroke (cerebrum) ([place]) 10/09/2019  . Stroke [facility])    2011  . Type 2 diabetes mellitus ([place])     Past Surgical History:  Procedure Laterality Date  . APPENDECTOMY    . BUBBLE STUDY N/A 11/18/2019   Procedure: BUBBLE STUDY;  Surgeon: Herminio Commons, MD;  Location: [facility];  Service: Cardiology;  Laterality: N/A;  . COLON RESECTION     For diverticulitis  . COLON SURGERY    . ESOPHAGOGASTRODUODENOSCOPY N/A 10/03/2018   Procedure: ESOPHAGOGASTRODUODENOSCOPY (EGD);  Surgeon: [name], MD;  Location: [name];  Service: Endoscopy;  Laterality: N/A;  . EUS N/A 10/03/2018   Procedure: UPPER ENDOSCOPIC ULTRASOUND (EUS) RADIAL;  Surgeon: [name], MD;  Location: [facility];  Service: Endoscopy;  Laterality: N/A;  . FINE NEEDLE ASPIRATION N/A 10/03/2018   Procedure: FINE NEEDLE ASPIRATION (FNA) LINEAR;  Surgeon: [name], MD;  Location: [facility];  Service: Endoscopy;  Laterality: N/A;  . IR ANGIO INTRA EXTRACRAN SEL COM CAROTID INNOMINATE BILAT MOD SED  01/21/2018  . IR ANGIO INTRA EXTRACRAN SEL COM CAROTID INNOMINATE UNI R MOD SED  03/21/2018  . IR ANGIO VERTEBRAL SEL VERTEBRAL BILAT MOD SED  01/21/2018  . IR TRANSCATH EXCRAN VERT OR CAR A STENT  03/21/2018  . LEFT HEART CATH AND CORONARY ANGIOGRAPHY N/A 01/08/2020   Procedure: LEFT HEART CATH AND CORONARY ANGIOGRAPHY;  Surgeon: Leonie Man, MD;  Location: [facility];  Service: Cardiovascular;  Laterality: N/A;  . PORTACATH  PLACEMENT Left 12/23/2018   Procedure: INSERTION PORT-A-CATH;  Surgeon: Virl Cagey, MD;  Location: [facility];  Service: General;  Laterality: Left;  . RADIOLOGY WITH ANESTHESIA N/A 03/21/2018   Procedure: IR WITH ANESTHESIA WITH STENT PLACEMENT;  Surgeon: Luanne Bras, MD;  Location: [facility];  Service: Radiology;  Laterality: N/A;  . ROTATOR CUFF REPAIR     Left  . TEE WITHOUT CARDIOVERSION N/A 11/18/2019   Procedure: TRANSESOPHAGEAL ECHOCARDIOGRAM (TEE) WITH PROPOFOL;   Surgeon: Herminio Commons, MD;  Location: [facility];  Service: Cardiology;  Laterality: N/A;    Family History  Problem Relation Age of Onset  . Stroke Mother   . Pancreatic cancer Father 27       d. 30  . Stroke Maternal Grandmother   . Heart attack Maternal Grandfather   . Heart Problems Paternal Grandfather   . Scoliosis Daughter   . Muscular dystrophy Grandson     Social History Social History   Tobacco Use  . Smoking status: Former Smoker    Start date: 10/06/2019  . Smokeless tobacco: Never Used  Substance Use Topics  . Alcohol use: Yes    Alcohol/week: 1.0 standard drinks    Types: 1 Cans of beer per week    Comment: rare   . Drug use: Never    Current Outpatient Medications  Medication Sig Dispense Refill  . atorvastatin (LIPITOR) 80 MG tablet TAKE 1 TABLET(80 MG) BY MOUTH DAILY AT 6 PM (Patient taking differently: Take 80 mg by mouth daily at 6 PM. ) 90 tablet 0  . clopidogrel (PLAVIX) 75 MG tablet Take 1 tablet (75 mg total) by mouth daily. 60 tablet 1  . ECHINACEA EXTRACT PO Take 2 tablets by mouth daily.     Marland Kitchen ezetimibe (ZETIA) 10 MG tablet Take 1 tablet (10 mg total) by mouth daily. 30 tablet 6  . gabapentin (NEURONTIN) 300 MG capsule Take 1 capsule (300 mg total) by mouth 2 (two) times daily. 180 capsule 0  . insulin aspart (NOVOLOG) 100 UNIT/ML FlexPen Inject 6 Units into the skin 3 (three) times daily with meals. (Patient taking differently: Inject 6-10 Units into the skin 3 (three) times daily with meals. ) 15 mL 0  . insulin glargine (LANTUS SOLOSTAR) 100 UNIT/ML Solostar Pen Inject 36 Units into the skin at bedtime. 5 pen 2  . Insulin Pen Needle 32G X 4 MM MISC 1 Device by Does not apply route daily. 50 each 2  . levothyroxine (SYNTHROID) 50 MCG tablet Take 1 tablet (50 mcg total) by mouth daily. 90 tablet 1  . lidocaine-prilocaine (EMLA) cream Apply 1 application topically daily as needed (prior to port being accessed (~every 3 months)).    Marland Kitchen loperamide  (IMODIUM A-D) 2 MG tablet Take 1 tablet (2 mg total) by mouth as needed. Take 2 at diarrhea onset , then 1 every 2hr until 12hrs with no BM. May take 2 every 4hrs at night. If diarrhea recurs repeat. (Patient taking differently: Take 2 mg by mouth as needed for diarrhea or loose stools. ) 100 tablet 1  . meclizine (ANTIVERT) 25 MG tablet Take 1 tablet (25 mg total) by mouth 3 (three) times daily as needed for dizziness. 30 tablet 0  . metoprolol tartrate (LOPRESSOR) 25 MG tablet Take 1 tablet (25 mg total) by mouth 2 (two) times daily. 180 tablet 3  . nitroGLYCERIN (NITROSTAT) 0.4 MG SL tablet Place 1 tablet (0.4 mg total) under the tongue every 5 (five) minutes as needed  for chest pain. 25 tablet 3  . Omega-3 1000 MG CAPS Take 1,000 mg by mouth daily.    . sertraline (ZOLOFT) 50 MG tablet TAKE 1 TABLET BY MOUTH DAILY (Patient taking differently: Take 50 mg by mouth daily. ) 90 tablet 1  . tamsulosin (FLOMAX) 0.4 MG CAPS capsule Take 1 capsule (0.4 mg total) by mouth daily. 90 capsule 3  . traMADol (ULTRAM) 50 MG tablet Take 50 mg by mouth 2 (two) times daily as needed for moderate pain.    . vitamin B-12 (CYANOCOBALAMIN) 1000 MCG tablet Take 1,000 mcg by mouth daily.     Current Facility-Administered Medications  Medication Dose Route Frequency Provider Last Rate Last Admin  . levETIRAcetam (KEPPRA XR) 24 hr tablet 500 mg  500 mg Oral Daily Leonie Man, Pramod S, MD      . sodium chloride flush (NS) 0.9 % injection 3 mL  3 mL Intravenous Q12H Herminio Commons, MD       Facility-Administered Medications Ordered in Other Visits  Medication Dose Route Frequency Provider Last Rate Last Admin  . 0.9 %  sodium chloride infusion   Intravenous Continuous Derek Jack, MD 20 mL/hr at 12/30/18 1351 New Bag at 12/30/18 1351  . 0.9 %  sodium chloride infusion   Intravenous Continuous Derek Jack, MD 20 mL/hr at 12/30/18 1401 New Bag at 12/30/18 1401  . dextrose 5 % solution   Intravenous Once  Derek Jack, MD      . heparin lock flush 100 unit/mL  500 Units Intracatheter Once PRN Derek Jack, MD      . sodium chloride flush (NS) 0.9 % injection 10 mL  10 mL Intracatheter PRN Derek Jack, MD   10 mL at 12/30/18 0911  . sodium chloride flush (NS) 0.9 % injection 10 mL  10 mL Intracatheter PRN Derek Jack, MD   10 mL at 05/21/19 0845    Allergies  Allergen Reactions  . Demerol [Meperidine Hcl] Other (See Comments)    convulsions    Review of Systems  Constitutional: Positive for activity change and fatigue. Negative for chills, fever and unexpected weight change.  HENT: Positive for dental problem (dentures).   Respiratory: Positive for shortness of breath.   Cardiovascular: Positive for chest pain.  Gastrointestinal: Negative for abdominal distention and abdominal pain.  Genitourinary: Negative for difficulty urinating and dysuria.  Musculoskeletal: Positive for arthralgias, gait problem and myalgias.  Neurological: Positive for dizziness, syncope, numbness and headaches. Negative for weakness.  Hematological: Negative for adenopathy. Bruises/bleeds easily.  All other systems reviewed and are negative.   BP 125/74 (BP Location: Right Arm, Patient Position: Sitting, Cuff Size: Normal)   Pulse 80   Temp 97.9 F (36.6 C) (Temporal)   Resp 20   Ht 5\' 10"  (1.778 m)   Wt 164 lb (74.4 kg)   SpO2 97% Comment: RA  BMI 23.53 kg/m  Physical Exam Vitals reviewed.  Constitutional:      General: He is not in acute distress.    Appearance: Normal appearance.  HENT:     Head: Normocephalic and atraumatic.  Eyes:     General: No scleral icterus.    Extraocular Movements: Extraocular movements intact.  Neck:     Vascular: No carotid bruit.  Cardiovascular:     Rate and Rhythm: Normal rate and regular rhythm.     Heart sounds: No murmur. No gallop.   Pulmonary:     Effort: Pulmonary effort is normal. No respiratory distress.  Breath  sounds: Normal breath sounds. No wheezing or rales.  Abdominal:     General: There is no distension.     Palpations: Abdomen is soft.     Tenderness: There is no abdominal tenderness.  Musculoskeletal:        General: No swelling.     Cervical back: No rigidity.  Skin:    General: Skin is warm and dry.  Neurological:     General: No focal deficit present.     Mental Status: He is alert and oriented to person, place, and time.     Cranial Nerves: No cranial nerve deficit.     Motor: No weakness.     Gait: Gait abnormal.    Diagnostic Tests: Cardiac catheterization Conclusion    Ost LAD to Prox LAD lesion is 75% stenosed. Prox LAD to Mid LAD lesion is 55% stenosed -this segment was considered significant by CT FFR  Mid LAD to Dist LAD lesion is 40% stenosed.  Dist (apical) LAD lesion is 75% stenosed.  Prox Cx to Dist Cx lesion is 65% stenosed. Long irregular lesion-significant by CT FFR  Prox RCA to Mid RCA lesion is 75% stenosed. Mid RCA lesion is 80% stenosed -lesions in tandem significant by CT FFR  Dist RCA-1 lesion is 55% stenosed.  Dist RCA-2 lesion is 80% stenosed with 80% stenosed side branch in RPDA. Significant by CT FFR  LV end diastolic pressure is mildly elevated.  There is no aortic valve stenosis.    Severe multivessel CAD involving the proximal third of the LAD, mid LCx and mid-distal RCA confirming findings seen on coronary CTA -> recommend CABG  Mildly elevated LVEDP    I personally reviewed the cardiac catheterization images and concur with the findings noted above   Impression: Mark Foster is a 64 year old gentleman with a history of stroke in 2011, stroke in February 2021, chronic left ICA occlusion, stage IIb pancreatic cancer treated with distal pancreatectomy and splenectomy followed by chemo, type 2 insulin-dependent diabetes complicated by diabetic neuropathy, hypothyroidism, and cervical spinal stenosis.  He had a syncopal episode in  February.  He was diagnosed with multiple small strokes.  This was felt to possibly be cardioembolic.  He had an extensive work-up which included coronary CTA.  It showed evidence of three-vessel disease.  He then had cardiac catheterization which confirmed three-vessel disease with hemodynamically significant lesions in the LAD circumflex and right coronary.  He has normal left ventricular function.  He has been having some chest tightness.  Its not entirely classic for angina but certainly could be angina.  He also has fatigue which may or may not have anything to do with his coronary disease given his other medical problems.  With three-vessel coronary disease and preserved left-ventricular function coronary artery bypass grafting for survival benefit as well as relief of symptoms.  Again is unclear to what degree his symptoms are related to his coronary disease.  I described the proposed operation of coronary bypass grafting to Mr. Mckinney.  He understands the general nature of the procedure, the incisions to be used, the use of cardiopulmonary bypass, the use of drainage tubes postoperatively, the expected hospital stay, and the overall recovery.  I informed him of the indications, risks, benefits, and alternatives.  He understands the risks include, but not limited to death, MI, DVT, PE, bleeding, possible need for transfusion, infection, cardiac arrhythmias, as well as possibility of other unforeseeable complications such as respiratory or renal failure.  He understands the  risk of stroke is elevated given his relatively recent events.  He is on Plavix and we will need to hold that for 5 days prior to surgery.  We will check with Dr. Leonie Man regarding timing of surgery.  Given his symptoms I think we likely need to go ahead, but want to make sure that we are okay doing that.  He understands and accepts the risks.  He understands the risk of stroke is elevated.  He wishes to proceed  Plan: Coronary  artery bypass grafting on Monday, 01/26/2020. Hold Plavix for 5 days prior to surgery  I spent 60 minutes in review of records, review of images, and directly with Mr.Godley during his visit today. Melrose Nakayama, MD [facility] [phone]

## 2020-01-13 NOTE — H&P (View-Only) (Signed)
PCP is Ailene Ards, NP Referring Provider is Leonie Man, MD  Chief Complaint  Patient presents with  . Coronary Artery Disease    eval fo CABG...ECHO 11/18/19, CATH 01/08/20, CAROTIDS 10/09/19    HPI: Mark Foster is sent for consultation regarding three-vessel coronary disease.  Mark Foster is a 64 year old man with a complex medical history including a stroke in 2011 and more recently in February 2021, left internal carotid occlusion, pancreatic cancer status post distal pancreatectomy and splenectomy, type 2 insulin-dependent diabetes complicated by diabetic neuropathy, hypothyroidism, cervical spinal stenosis.    He was admitted in February after a syncopal spell.  He was found to have multiple small strokes.  He had an extensive work-up including TEE with a bubble study which showed normal LV function and no significant valvular pathology.  He had carotid Dopplers which showed a chronic total occlusion of the left ICA and less than 50% narrowing of the right ICA.  CT coronary angiography on 12/29/1999 showed three-vessel disease.  He had a 30-day event monitor with no evidence of atrial fibrillation or flutter.  He had cardiac catheterization on 01/08/2020 which showed three-vessel coronary disease.  FFR confirmed hemodynamic significance in the LAD and circumflex.  He has been having some chest pressure.  He says this will come on sporadically.  Sometimes occurs at rest.  He does not get it with a consistent degree of exercise.  He still has some difficulty with balance.  He has walks with a cane for several years.  He has not had any other syncopal episodes.   Past Medical History:  Diagnosis Date  . Cervical compression fracture ([place])   . Cervical spinal stenosis   . Diabetic neuropathy (HCC)    Bilateral legs  . Diverticulitis   . Family history of pancreatic cancer   . History of kidney stones   . Hypothyroidism   . Pancreatic cancer ([place])   . Stenosis of left vertebral artery    . Stroke (cerebrum) ([place]) 10/09/2019  . Stroke [facility])    2011  . Type 2 diabetes mellitus ([place])     Past Surgical History:  Procedure Laterality Date  . APPENDECTOMY    . BUBBLE STUDY N/A 11/18/2019   Procedure: BUBBLE STUDY;  Surgeon: Herminio Commons, MD;  Location: [facility];  Service: Cardiology;  Laterality: N/A;  . COLON RESECTION     For diverticulitis  . COLON SURGERY    . ESOPHAGOGASTRODUODENOSCOPY N/A 10/03/2018   Procedure: ESOPHAGOGASTRODUODENOSCOPY (EGD);  Surgeon: [name], MD;  Location: [name];  Service: Endoscopy;  Laterality: N/A;  . EUS N/A 10/03/2018   Procedure: UPPER ENDOSCOPIC ULTRASOUND (EUS) RADIAL;  Surgeon: [name], MD;  Location: [facility];  Service: Endoscopy;  Laterality: N/A;  . FINE NEEDLE ASPIRATION N/A 10/03/2018   Procedure: FINE NEEDLE ASPIRATION (FNA) LINEAR;  Surgeon: [name], MD;  Location: [facility];  Service: Endoscopy;  Laterality: N/A;  . IR ANGIO INTRA EXTRACRAN SEL COM CAROTID INNOMINATE BILAT MOD SED  01/21/2018  . IR ANGIO INTRA EXTRACRAN SEL COM CAROTID INNOMINATE UNI R MOD SED  03/21/2018  . IR ANGIO VERTEBRAL SEL VERTEBRAL BILAT MOD SED  01/21/2018  . IR TRANSCATH EXCRAN VERT OR CAR A STENT  03/21/2018  . LEFT HEART CATH AND CORONARY ANGIOGRAPHY N/A 01/08/2020   Procedure: LEFT HEART CATH AND CORONARY ANGIOGRAPHY;  Surgeon: Leonie Man, MD;  Location: [facility];  Service: Cardiovascular;  Laterality: N/A;  . PORTACATH  PLACEMENT Left 12/23/2018   Procedure: INSERTION PORT-A-CATH;  Surgeon: Virl Cagey, MD;  Location: [facility];  Service: General;  Laterality: Left;  . RADIOLOGY WITH ANESTHESIA N/A 03/21/2018   Procedure: IR WITH ANESTHESIA WITH STENT PLACEMENT;  Surgeon: Luanne Bras, MD;  Location: [facility];  Service: Radiology;  Laterality: N/A;  . ROTATOR CUFF REPAIR     Left  . TEE WITHOUT CARDIOVERSION N/A 11/18/2019   Procedure: TRANSESOPHAGEAL ECHOCARDIOGRAM (TEE) WITH PROPOFOL;   Surgeon: Herminio Commons, MD;  Location: [facility];  Service: Cardiology;  Laterality: N/A;    Family History  Problem Relation Age of Onset  . Stroke Mother   . Pancreatic cancer Father 23       d. 44  . Stroke Maternal Grandmother   . Heart attack Maternal Grandfather   . Heart Problems Paternal Grandfather   . Scoliosis Daughter   . Muscular dystrophy Grandson     Social History Social History   Tobacco Use  . Smoking status: Former Smoker    Start date: 10/06/2019  . Smokeless tobacco: Never Used  Substance Use Topics  . Alcohol use: Yes    Alcohol/week: 1.0 standard drinks    Types: 1 Cans of beer per week    Comment: rare   . Drug use: Never    Current Outpatient Medications  Medication Sig Dispense Refill  . atorvastatin (LIPITOR) 80 MG tablet TAKE 1 TABLET(80 MG) BY MOUTH DAILY AT 6 PM (Patient taking differently: Take 80 mg by mouth daily at 6 PM. ) 90 tablet 0  . clopidogrel (PLAVIX) 75 MG tablet Take 1 tablet (75 mg total) by mouth daily. 60 tablet 1  . ECHINACEA EXTRACT PO Take 2 tablets by mouth daily.     Marland Kitchen ezetimibe (ZETIA) 10 MG tablet Take 1 tablet (10 mg total) by mouth daily. 30 tablet 6  . gabapentin (NEURONTIN) 300 MG capsule Take 1 capsule (300 mg total) by mouth 2 (two) times daily. 180 capsule 0  . insulin aspart (NOVOLOG) 100 UNIT/ML FlexPen Inject 6 Units into the skin 3 (three) times daily with meals. (Patient taking differently: Inject 6-10 Units into the skin 3 (three) times daily with meals. ) 15 mL 0  . insulin glargine (LANTUS SOLOSTAR) 100 UNIT/ML Solostar Pen Inject 36 Units into the skin at bedtime. 5 pen 2  . Insulin Pen Needle 32G X 4 MM MISC 1 Device by Does not apply route daily. 50 each 2  . levothyroxine (SYNTHROID) 50 MCG tablet Take 1 tablet (50 mcg total) by mouth daily. 90 tablet 1  . lidocaine-prilocaine (EMLA) cream Apply 1 application topically daily as needed (prior to port being accessed (~every 3 months)).    Marland Kitchen loperamide  (IMODIUM A-D) 2 MG tablet Take 1 tablet (2 mg total) by mouth as needed. Take 2 at diarrhea onset , then 1 every 2hr until 12hrs with no BM. May take 2 every 4hrs at night. If diarrhea recurs repeat. (Patient taking differently: Take 2 mg by mouth as needed for diarrhea or loose stools. ) 100 tablet 1  . meclizine (ANTIVERT) 25 MG tablet Take 1 tablet (25 mg total) by mouth 3 (three) times daily as needed for dizziness. 30 tablet 0  . metoprolol tartrate (LOPRESSOR) 25 MG tablet Take 1 tablet (25 mg total) by mouth 2 (two) times daily. 180 tablet 3  . nitroGLYCERIN (NITROSTAT) 0.4 MG SL tablet Place 1 tablet (0.4 mg total) under the tongue every 5 (five) minutes as needed  for chest pain. 25 tablet 3  . Omega-3 1000 MG CAPS Take 1,000 mg by mouth daily.    . sertraline (ZOLOFT) 50 MG tablet TAKE 1 TABLET BY MOUTH DAILY (Patient taking differently: Take 50 mg by mouth daily. ) 90 tablet 1  . tamsulosin (FLOMAX) 0.4 MG CAPS capsule Take 1 capsule (0.4 mg total) by mouth daily. 90 capsule 3  . traMADol (ULTRAM) 50 MG tablet Take 50 mg by mouth 2 (two) times daily as needed for moderate pain.    . vitamin B-12 (CYANOCOBALAMIN) 1000 MCG tablet Take 1,000 mcg by mouth daily.     Current Facility-Administered Medications  Medication Dose Route Frequency Provider Last Rate Last Admin  . levETIRAcetam (KEPPRA XR) 24 hr tablet 500 mg  500 mg Oral Daily Leonie Man, Pramod S, MD      . sodium chloride flush (NS) 0.9 % injection 3 mL  3 mL Intravenous Q12H Herminio Commons, MD       Facility-Administered Medications Ordered in Other Visits  Medication Dose Route Frequency Provider Last Rate Last Admin  . 0.9 %  sodium chloride infusion   Intravenous Continuous Derek Jack, MD 20 mL/hr at 12/30/18 1351 New Bag at 12/30/18 1351  . 0.9 %  sodium chloride infusion   Intravenous Continuous Derek Jack, MD 20 mL/hr at 12/30/18 1401 New Bag at 12/30/18 1401  . dextrose 5 % solution   Intravenous Once  Derek Jack, MD      . heparin lock flush 100 unit/mL  500 Units Intracatheter Once PRN Derek Jack, MD      . sodium chloride flush (NS) 0.9 % injection 10 mL  10 mL Intracatheter PRN Derek Jack, MD   10 mL at 12/30/18 0911  . sodium chloride flush (NS) 0.9 % injection 10 mL  10 mL Intracatheter PRN Derek Jack, MD   10 mL at 05/21/19 0845    Allergies  Allergen Reactions  . Demerol [Meperidine Hcl] Other (See Comments)    convulsions    Review of Systems  Constitutional: Positive for activity change and fatigue. Negative for chills, fever and unexpected weight change.  HENT: Positive for dental problem (dentures).   Respiratory: Positive for shortness of breath.   Cardiovascular: Positive for chest pain.  Gastrointestinal: Negative for abdominal distention and abdominal pain.  Genitourinary: Negative for difficulty urinating and dysuria.  Musculoskeletal: Positive for arthralgias, gait problem and myalgias.  Neurological: Positive for dizziness, syncope, numbness and headaches. Negative for weakness.  Hematological: Negative for adenopathy. Bruises/bleeds easily.  All other systems reviewed and are negative.   BP 125/74 (BP Location: Right Arm, Patient Position: Sitting, Cuff Size: Normal)   Pulse 80   Temp 97.9 F (36.6 C) (Temporal)   Resp 20   Ht 5\' 10"  (1.778 m)   Wt 164 lb (74.4 kg)   SpO2 97% Comment: RA  BMI 23.53 kg/m  Physical Exam Vitals reviewed.  Constitutional:      General: He is not in acute distress.    Appearance: Normal appearance.  HENT:     Head: Normocephalic and atraumatic.  Eyes:     General: No scleral icterus.    Extraocular Movements: Extraocular movements intact.  Neck:     Vascular: No carotid bruit.  Cardiovascular:     Rate and Rhythm: Normal rate and regular rhythm.     Heart sounds: No murmur. No gallop.   Pulmonary:     Effort: Pulmonary effort is normal. No respiratory distress.  Breath  sounds: Normal breath sounds. No wheezing or rales.  Abdominal:     General: There is no distension.     Palpations: Abdomen is soft.     Tenderness: There is no abdominal tenderness.  Musculoskeletal:        General: No swelling.     Cervical back: No rigidity.  Skin:    General: Skin is warm and dry.  Neurological:     General: No focal deficit present.     Mental Status: He is alert and oriented to person, place, and time.     Cranial Nerves: No cranial nerve deficit.     Motor: No weakness.     Gait: Gait abnormal.    Diagnostic Tests: Cardiac catheterization Conclusion    Ost LAD to Prox LAD lesion is 75% stenosed. Prox LAD to Mid LAD lesion is 55% stenosed -this segment was considered significant by CT FFR  Mid LAD to Dist LAD lesion is 40% stenosed.  Dist (apical) LAD lesion is 75% stenosed.  Prox Cx to Dist Cx lesion is 65% stenosed. Long irregular lesion-significant by CT FFR  Prox RCA to Mid RCA lesion is 75% stenosed. Mid RCA lesion is 80% stenosed -lesions in tandem significant by CT FFR  Dist RCA-1 lesion is 55% stenosed.  Dist RCA-2 lesion is 80% stenosed with 80% stenosed side branch in RPDA. Significant by CT FFR  LV end diastolic pressure is mildly elevated.  There is no aortic valve stenosis.    Severe multivessel CAD involving the proximal third of the LAD, mid LCx and mid-distal RCA confirming findings seen on coronary CTA -> recommend CABG  Mildly elevated LVEDP    I personally reviewed the cardiac catheterization images and concur with the findings noted above   Impression: Mark Foster is a 64 year old gentleman with a history of stroke in 2011, stroke in February 2021, chronic left ICA occlusion, stage IIb pancreatic cancer treated with distal pancreatectomy and splenectomy followed by chemo, type 2 insulin-dependent diabetes complicated by diabetic neuropathy, hypothyroidism, and cervical spinal stenosis.  He had a syncopal episode in  February.  He was diagnosed with multiple small strokes.  This was felt to possibly be cardioembolic.  He had an extensive work-up which included coronary CTA.  It showed evidence of three-vessel disease.  He then had cardiac catheterization which confirmed three-vessel disease with hemodynamically significant lesions in the LAD circumflex and right coronary.  He has normal left ventricular function.  He has been having some chest tightness.  Its not entirely classic for angina but certainly could be angina.  He also has fatigue which may or may not have anything to do with his coronary disease given his other medical problems.  With three-vessel coronary disease and preserved left-ventricular function coronary artery bypass grafting for survival benefit as well as relief of symptoms.  Again is unclear to what degree his symptoms are related to his coronary disease.  I described the proposed operation of coronary bypass grafting to Mark Foster.  He understands the general nature of the procedure, the incisions to be used, the use of cardiopulmonary bypass, the use of drainage tubes postoperatively, the expected hospital stay, and the overall recovery.  I informed him of the indications, risks, benefits, and alternatives.  He understands the risks include, but not limited to death, MI, DVT, PE, bleeding, possible need for transfusion, infection, cardiac arrhythmias, as well as possibility of other unforeseeable complications such as respiratory or renal failure.  He understands the  risk of stroke is elevated given his relatively recent events.  He is on Plavix and we will need to hold that for 5 days prior to surgery.  We will check with Dr. Leonie Man regarding timing of surgery.  Given his symptoms I think we likely need to go ahead, but want to make sure that we are okay doing that.  He understands and accepts the risks.  He understands the risk of stroke is elevated.  He wishes to proceed  Plan: Coronary  artery bypass grafting on Monday, 01/26/2020. Hold Plavix for 5 days prior to surgery  I spent 60 minutes in review of records, review of images, and directly with Mark Foster during his visit today. Melrose Nakayama, MD [facility] [phone]

## 2020-01-14 ENCOUNTER — Encounter: Payer: Self-pay | Admitting: Thoracic Surgery (Cardiothoracic Vascular Surgery)

## 2020-01-14 ENCOUNTER — Other Ambulatory Visit (INDEPENDENT_AMBULATORY_CARE_PROVIDER_SITE_OTHER): Payer: Self-pay

## 2020-01-14 ENCOUNTER — Encounter: Payer: Self-pay | Admitting: *Deleted

## 2020-01-14 NOTE — Progress Notes (Unsigned)
Garvin Fila, MD  Melrose Nakayama, MD  Remo Lipps,  The maximum risk. For recurrent stroke is in the first 3 months which he will likely be beyond by the time a few CABG surgery. The risk benefit would be in favor of doing the surgery. Avoid hypotension Intra-Op and postop due to his known left carotid occlusion to minimize stroke risk. He is neurologically cleared for emergent cardiac bypass surgery.  [place] Roxan Hockey, MD [facility] [phone]

## 2020-01-15 ENCOUNTER — Encounter (INDEPENDENT_AMBULATORY_CARE_PROVIDER_SITE_OTHER): Payer: Self-pay | Admitting: Nurse Practitioner

## 2020-01-15 ENCOUNTER — Other Ambulatory Visit: Payer: Self-pay

## 2020-01-15 ENCOUNTER — Ambulatory Visit (INDEPENDENT_AMBULATORY_CARE_PROVIDER_SITE_OTHER): Payer: Medicare HMO | Admitting: Nurse Practitioner

## 2020-01-15 VITALS — BP 124/72 | HR 91 | Temp 98.7°F | Resp 15 | Ht 70.0 in | Wt 165.0 lb

## 2020-01-15 DIAGNOSIS — E782 Mixed hyperlipidemia: Secondary | ICD-10-CM

## 2020-01-15 DIAGNOSIS — R55 Syncope and collapse: Secondary | ICD-10-CM

## 2020-01-15 DIAGNOSIS — I25119 Atherosclerotic heart disease of native coronary artery with unspecified angina pectoris: Secondary | ICD-10-CM

## 2020-01-15 DIAGNOSIS — I10 Essential (primary) hypertension: Secondary | ICD-10-CM | POA: Diagnosis not present

## 2020-01-15 DIAGNOSIS — E039 Hypothyroidism, unspecified: Secondary | ICD-10-CM

## 2020-01-15 DIAGNOSIS — E119 Type 2 diabetes mellitus without complications: Secondary | ICD-10-CM

## 2020-01-15 NOTE — Progress Notes (Signed)
Subjective:  Patient ID: Mark Foster, male    DOB: 10/11/1955  Age: 64 y.o. MRN: [id number]  CC:  Chief Complaint  Patient presents with  . Diabetes  . Hyperlipidemia  . Hypertension  . Other    CAD, Syncope      HPI  This patient comes in today for follow-up of the above.  Diabetes: He has a history of type 2 diabetes and is now considered insulin-dependent.  He does follow with Dr. Dorris Fetch.  He tells me that his blood sugars at home have been elevated and tells me his fasting blood sugars have been in the 200s.  He does take Lantus daily as well as NovoLog 3 times a day.  Hyperlipidemia/Coronary artery disease: He is scheduled to undergo coronary artery bypass on May 24.  He tells me that he has had some intermittent chest pain but take his nitroglycerin and chest pain resolved.  He is also been experiencing some fatigue and progressive shortness of breath.  He continues on atorvastatin 80 mg and Zetia 10 mg.   Hypertension: He has a history of hypertension and takes metoprolol 25 mg twice a day.   Syncope: He experienced a syncopal episode back in February 2021.  He has undergone work-up with cardiology as well as neurology.  Etiology is uncertain however he is trialing Keppra at this time.  He has not had any additional syncopal episodes.  Hypothyroidism: He has a history of hypothyroidism and is currently on levothyroxine 50 mcg daily.  In the past he was on NP thyroid but went back to levothyroxine due to NP thyroid being put expensive.  Last thyroid panel was collected in January 2021 and it was normal.  He is not sure if he was on levothyroxine or NP thyroid at that time.   Past Medical History:  Diagnosis Date  . Cervical compression fracture ([place])   . Cervical spinal stenosis   . Diabetic neuropathy (HCC)    Bilateral legs  . Diverticulitis   . Family history of pancreatic cancer   . History of kidney stones   . Hypothyroidism   . Pancreatic cancer ([place])   .  Stenosis of left vertebral artery   . Stroke (cerebrum) ([place]) 10/09/2019  . Stroke [facility])    2011  . Type 2 diabetes mellitus (HCC)       Family History  Problem Relation Age of Onset  . Stroke Mother   . Pancreatic cancer Father 45       d. 33  . Stroke Maternal Grandmother   . Heart attack Maternal Grandfather   . Heart Problems Paternal Grandfather   . Scoliosis Daughter   . Muscular dystrophy Grandson     Social History   Social History Narrative  . Not on file   Social History   Tobacco Use  . Smoking status: Former Smoker    Start date: 10/06/2019  . Smokeless tobacco: Never Used  Substance Use Topics  . Alcohol use: Yes    Alcohol/week: 1.0 standard drinks    Types: 1 Cans of beer per week    Comment: rare      Current Meds  Medication Sig  . atorvastatin (LIPITOR) 80 MG tablet TAKE 1 TABLET(80 MG) BY MOUTH DAILY AT 6 PM (Patient taking differently: Take 80 mg by mouth daily at 6 PM. )  . clopidogrel (PLAVIX) 75 MG tablet Take 1 tablet (75 mg total) by mouth daily.  Marland Kitchen ECHINACEA EXTRACT PO Take 2  tablets by mouth daily.   Marland Kitchen ezetimibe (ZETIA) 10 MG tablet Take 1 tablet (10 mg total) by mouth daily.  Marland Kitchen gabapentin (NEURONTIN) 300 MG capsule Take 1 capsule (300 mg total) by mouth 2 (two) times daily.  . insulin aspart (NOVOLOG) 100 UNIT/ML FlexPen Inject 6 Units into the skin 3 (three) times daily with meals. (Patient taking differently: Inject 6-10 Units into the skin 3 (three) times daily with meals. )  . insulin glargine (LANTUS SOLOSTAR) 100 UNIT/ML Solostar Pen Inject 36 Units into the skin at bedtime.  . Insulin Pen Needle 32G X 4 MM MISC 1 Device by Does not apply route daily.  Marland Kitchen levothyroxine (SYNTHROID) 50 MCG tablet Take 1 tablet (50 mcg total) by mouth daily.  Marland Kitchen lidocaine-prilocaine (EMLA) cream Apply 1 application topically daily as needed (prior to port being accessed (~every 3 months)).  Marland Kitchen loperamide (IMODIUM A-D) 2 MG tablet Take 1 tablet (2 mg total)  by mouth as needed. Take 2 at diarrhea onset , then 1 every 2hr until 12hrs with no BM. May take 2 every 4hrs at night. If diarrhea recurs repeat. (Patient taking differently: Take 2 mg by mouth as needed for diarrhea or loose stools. )  . meclizine (ANTIVERT) 25 MG tablet Take 1 tablet (25 mg total) by mouth 3 (three) times daily as needed for dizziness.  . metoprolol tartrate (LOPRESSOR) 25 MG tablet Take 1 tablet (25 mg total) by mouth 2 (two) times daily.  . nitroGLYCERIN (NITROSTAT) 0.4 MG SL tablet Place 1 tablet (0.4 mg total) under the tongue every 5 (five) minutes as needed for chest pain.  . Omega-3 1000 MG CAPS Take 1,000 mg by mouth daily.  . pantoprazole (PROTONIX) 20 MG tablet Take 20 mg by mouth daily.  . sertraline (ZOLOFT) 50 MG tablet TAKE 1 TABLET BY MOUTH DAILY (Patient taking differently: Take 50 mg by mouth daily. )  . tamsulosin (FLOMAX) 0.4 MG CAPS capsule Take 1 capsule (0.4 mg total) by mouth daily.  . traMADol (ULTRAM) 50 MG tablet Take 50 mg by mouth 2 (two) times daily as needed for moderate pain.  . vitamin B-12 (CYANOCOBALAMIN) 1000 MCG tablet Take 1,000 mcg by mouth daily.   Current Facility-Administered Medications for the 01/15/20 encounter (Office Visit) with Ailene Ards, NP  Medication  . levETIRAcetam (KEPPRA XR) 24 hr tablet 500 mg  . sodium chloride flush (NS) 0.9 % injection 3 mL    ROS:  Review of Systems  Constitutional: Positive for malaise/fatigue.  Eyes: Negative for blurred vision.  Respiratory: Positive for shortness of breath.   Cardiovascular: Positive for chest pain.  Neurological: Negative for dizziness and headaches.     Objective:   Today's Vitals: BP 124/72   Pulse 91   Temp 98.7 F (37.1 C) (Temporal)   Resp 15   Ht 5\' 10"  (1.778 m)   Wt 165 lb (74.8 kg)   SpO2 96%   BMI 23.68 kg/m  Vitals with BMI 01/15/2020 01/13/2020 01/08/2020  Height 5\' 10"  5\' 10"  -  Weight 165 lbs 164 lbs -  BMI [id number] 0000000 -  Systolic [id number]    Diastolic 72 74 73  Pulse 91 80 -     Physical Exam Vitals reviewed.  Constitutional:      Appearance: Normal appearance.  HENT:     Head: Normocephalic and atraumatic.  Cardiovascular:     Rate and Rhythm: Normal rate and regular rhythm.  Pulmonary:     Effort: Pulmonary  effort is normal.     Breath sounds: Normal breath sounds.  Musculoskeletal:     Cervical back: Neck supple.  Skin:    General: Skin is warm and dry.  Neurological:     Mental Status: He is alert and oriented to person, place, and time.  Psychiatric:        Mood and Affect: Mood normal.        Behavior: Behavior normal.        Thought Content: Thought content normal.        Judgment: Judgment normal.          Assessment and Plan   1. Hypothyroidism, unspecified type   2. Diabetes mellitus type 2 in nonobese (HCC)   3. Essential hypertension, benign   4. Coronary artery disease involving native heart with angina pectoris, unspecified vessel or lesion type ([place])   5. Syncope, unspecified syncope type   6. Mixed hyperlipidemia      Plan: 1.  I will collect a TSH for further evaluation today.  2.  He will continue on his current regimen.  I imagine he will be placed on insulin drip for his hospitalization, and then he will follow-up with Dr. Dorris Fetch once discharged to the hospital.  3.  Blood pressure well controlled today we will not make any changes to medication regimen.  4.,  6.  He will follow-up with his cardiologist as well as his cardiac surgeon to undergo coronary artery bypass later this month.  He will continue on his current medication regimen.  5.  He will continue on his Keppra as prescribed.   Tests ordered Orders Placed This Encounter  Procedures  . TSH      No orders of the defined types were placed in this encounter.   Patient to follow-up in 3 to 4 months or sooner as needed.  Ailene Ards, NP

## 2020-01-16 ENCOUNTER — Telehealth (INDEPENDENT_AMBULATORY_CARE_PROVIDER_SITE_OTHER): Payer: Self-pay | Admitting: Nurse Practitioner

## 2020-01-16 LAB — TSH: TSH: 1.84 mIU/L (ref 0.40–4.50)

## 2020-01-16 NOTE — Telephone Encounter (Signed)
Mark Foster, please call patient and let him know that his TSH level was great!  Thus he can stay on his current levothyroxine dose.  Thank you.

## 2020-01-19 NOTE — Telephone Encounter (Signed)
Mark Foster is aware

## 2020-01-20 ENCOUNTER — Other Ambulatory Visit (INDEPENDENT_AMBULATORY_CARE_PROVIDER_SITE_OTHER): Payer: Self-pay

## 2020-01-20 MED ORDER — LANTUS SOLOSTAR 100 UNIT/ML ~~LOC~~ SOPN: 36.0000 [IU] | PEN_INJECTOR | Freq: Every day | SUBCUTANEOUS | 2 refills | Status: DC

## 2020-01-20 MED ORDER — PANTOPRAZOLE SODIUM 20 MG PO TBEC: 20.0000 mg | DELAYED_RELEASE_TABLET | Freq: Every day | ORAL | 0 refills | Status: DC

## 2020-01-21 NOTE — Progress Notes (Signed)
[id number] - [place], [place] [address] Phone: [phone] Fax: [phone]  [facility] - [place], [place] [address] Phone: [phone] Fax: [phone]      Your procedure is scheduled on Monday, Jan 26, 2020.  Report to [facility] Main Entrance "A" at 5:30 A.M., and check in at the Admitting office.  Call this number if you have problems the morning of surgery:  [phone]  Call [phone] if you have any questions prior to your surgery date Monday-Friday 8am-4pm    Remember:  Do not eat or drink after midnight the night before your surgery    Take these medicines the morning of surgery with A SIP OF WATER:  ezetimibe (ZETIA) gabapentin (NEURONTIN)  levothyroxine (SYNTHROID) metoprolol tartrate (LOPRESSOR) sertraline (ZOLOFT)  tamsulosin (FLOMAX)  If needed: loperamide (IMODIUM A-D) meclizine (ANTIVERT) nitroGLYCERIN (NITROSTAT) pantoprazole (PROTONIX) traMADol (ULTRAM)  Follow your surgeon's instructions on when to stop Plavix.  If no instructions were given by your surgeon then you will need to call the office to get those instructions.    As of today, STOP taking any Aspirin containing products, Aleve, Naproxen, Ibuprofen, Motrin, Advil, Goody's, BC's, all herbal medications, fish oil, and all vitamins.    WHAT DO I DO ABOUT MY DIABETES MEDICATION?   Marland Kitchen Do not take insulin aspart (NOVOLOG) the night before surgery OR the morning of surgery.  . THE NIGHT BEFORE SURGERY, take ___18_____ units of _insulin glargine (LANTUS SOLOSTAR)__insulin.      . If your CBG is greater than 220 mg/dL, you may take  of your sliding scale (insulin aspart (NOVOLOG)) dose of insulin.   HOW TO MANAGE YOUR DIABETES BEFORE AND AFTER SURGERY  Why is it important to control my blood sugar before and  after surgery? . Improving blood sugar levels before and after surgery helps healing and can limit problems. . A way of improving blood sugar control is eating a healthy diet by: o  Eating less sugar and carbohydrates o  Increasing activity/exercise o  Talking with your doctor about reaching your blood sugar goals . High blood sugars (greater than 180 mg/dL) can raise your risk of infections and slow your recovery, so you will need to focus on controlling your diabetes during the weeks before surgery. . Make sure that the doctor who takes care of your diabetes knows about your planned surgery including the date and location.  How do I manage my blood sugar before surgery? . Check your blood sugar at least 4 times a day, starting 2 days before surgery, to make sure that the level is not too high or low. . Check your blood sugar the morning of your surgery when you wake up and every 2 hours until you get to the Short Stay unit. o If your blood sugar is less than 70 mg/dL, you will need to treat for low blood sugar: - Do not take insulin. - Treat a low blood sugar (less than 70 mg/dL) with  cup of clear juice (cranberry or apple), 4 glucose tablets, OR glucose gel. - Recheck blood sugar in 15 minutes after treatment (to make sure it is greater than 70 mg/dL). If your blood sugar is not greater than 70 mg/dL on recheck, call [phone] for further instructions. . Report your blood sugar to the short stay nurse  when you get to Short Stay.  . If you are admitted to the hospital after surgery: o Your blood sugar will be checked by the staff and you will probably be given insulin after surgery (instead of oral diabetes medicines) to make sure you have good blood sugar levels. o The goal for blood sugar control after surgery is 80-180 mg/dL.                      Do not wear jewelry.            Do not wear lotions, powders, colognes, or deodorant.            Men may shave face and neck.             Do not bring valuables to the hospital.            [facility] is not responsible for any belongings or valuables.  Do NOT Smoke (Tobacco/Vapping) or drink Alcohol 24 hours prior to your procedure If you use a CPAP at night, you may bring all equipment for your overnight stay.   Contacts, glasses, dentures or bridgework may not be worn into surgery.      For patients admitted to the hospital, discharge time will be determined by your treatment team.   Patients discharged the day of surgery will not be allowed to drive home, and someone needs to stay with them for 24 hours.    Special instructions:   [place]- Preparing For Surgery  Before surgery, you can play an important role. Because skin is not sterile, your skin needs to be as free of germs as possible. You can reduce the number of germs on your skin by washing with CHG (chlorahexidine gluconate) Soap before surgery.  CHG is an antiseptic cleaner which kills germs and bonds with the skin to continue killing germs even after washing.    Oral Hygiene is also important to reduce your risk of infection.  Remember - BRUSH YOUR TEETH THE MORNING OF SURGERY WITH YOUR REGULAR TOOTHPASTE  Please do not use if you have an allergy to CHG or antibacterial soaps. If your skin becomes reddened/irritated stop using the CHG.  Do not shave (including legs and underarms) for at least 48 hours prior to first CHG shower. It is OK to shave your face.  Please follow these instructions carefully.   1. Shower the NIGHT BEFORE SURGERY and the MORNING OF SURGERY with CHG Soap.   2. If you chose to wash your hair, wash your hair first as usual with your normal shampoo.  3. After you shampoo, rinse your hair and body thoroughly to remove the shampoo.  4. Use CHG as you would any other liquid soap. You can apply CHG directly to the skin and wash gently with a scrungie or a clean washcloth.   5. Apply the CHG Soap to your body ONLY FROM THE NECK DOWN.   Do not use on open wounds or open sores. Avoid contact with your eyes, ears, mouth and genitals (private parts). Wash Face and genitals (private parts)  with your normal soap.   6. Wash thoroughly, paying special attention to the area where your surgery will be performed.  7. Thoroughly rinse your body with warm water from the neck down.  8. DO NOT shower/wash with your normal soap after using and rinsing off the CHG Soap.  9. Pat yourself dry with a CLEAN TOWEL.  10. Wear CLEAN PAJAMAS  to bed the night before surgery, wear comfortable clothes the morning of surgery  11. Place CLEAN SHEETS on your bed the night of your first shower and DO NOT SLEEP WITH PETS.   Day of Surgery:   Do not apply any deodorants/lotions.  Please wear clean clothes to the hospital/surgery center.   Remember to brush your teeth WITH YOUR REGULAR TOOTHPASTE.   Please read over the following fact sheets that you were given.

## 2020-01-22 ENCOUNTER — Ambulatory Visit (HOSPITAL_COMMUNITY)
Admission: RE | Admit: 2020-01-22 | Discharge: 2020-01-22 | Disposition: A | Discharge status: Home / Self Care | Payer: Medicare HMO | Source: Ambulatory Visit | Attending: Thoracic Surgery (Cardiothoracic Vascular Surgery) | Admitting: Thoracic Surgery (Cardiothoracic Vascular Surgery)

## 2020-01-22 ENCOUNTER — Encounter (HOSPITAL_COMMUNITY)
Admission: RE | Admit: 2020-01-22 | Discharge: 2020-01-22 | Disposition: A | Discharge status: Home / Self Care | Payer: Medicare HMO | Source: Ambulatory Visit | Attending: Thoracic Surgery (Cardiothoracic Vascular Surgery) | Admitting: Thoracic Surgery (Cardiothoracic Vascular Surgery)

## 2020-01-22 ENCOUNTER — Encounter (HOSPITAL_COMMUNITY): Payer: Self-pay

## 2020-01-22 ENCOUNTER — Other Ambulatory Visit: Payer: Self-pay

## 2020-01-22 ENCOUNTER — Other Ambulatory Visit (HOSPITAL_COMMUNITY)
Admission: RE | Admit: 2020-01-22 | Discharge: 2020-01-22 | Disposition: A | Discharge status: Home / Self Care | Payer: Medicare HMO | Source: Ambulatory Visit | Attending: Thoracic Surgery (Cardiothoracic Vascular Surgery) | Admitting: Thoracic Surgery (Cardiothoracic Vascular Surgery)

## 2020-01-22 DIAGNOSIS — I2510 Atherosclerotic heart disease of native coronary artery without angina pectoris: Secondary | ICD-10-CM

## 2020-01-22 DIAGNOSIS — Z7902 Long term (current) use of antithrombotics/antiplatelets: Secondary | ICD-10-CM | POA: Diagnosis not present

## 2020-01-22 DIAGNOSIS — Z95820 Peripheral vascular angioplasty status with implants and grafts: Secondary | ICD-10-CM | POA: Insufficient documentation

## 2020-01-22 DIAGNOSIS — Z8507 Personal history of malignant neoplasm of pancreas: Secondary | ICD-10-CM | POA: Insufficient documentation

## 2020-01-22 DIAGNOSIS — E1165 Type 2 diabetes mellitus with hyperglycemia: Secondary | ICD-10-CM | POA: Insufficient documentation

## 2020-01-22 DIAGNOSIS — Z87891 Personal history of nicotine dependence: Secondary | ICD-10-CM | POA: Diagnosis not present

## 2020-01-22 DIAGNOSIS — R0600 Dyspnea, unspecified: Secondary | ICD-10-CM | POA: Diagnosis not present

## 2020-01-22 DIAGNOSIS — Z794 Long term (current) use of insulin: Secondary | ICD-10-CM | POA: Diagnosis not present

## 2020-01-22 DIAGNOSIS — Z01818 Encounter for other preprocedural examination: Secondary | ICD-10-CM | POA: Diagnosis not present

## 2020-01-22 DIAGNOSIS — Z79890 Hormone replacement therapy (postmenopausal): Secondary | ICD-10-CM | POA: Insufficient documentation

## 2020-01-22 DIAGNOSIS — R918 Other nonspecific abnormal finding of lung field: Secondary | ICD-10-CM | POA: Insufficient documentation

## 2020-01-22 DIAGNOSIS — E039 Hypothyroidism, unspecified: Secondary | ICD-10-CM | POA: Diagnosis not present

## 2020-01-22 DIAGNOSIS — Z79899 Other long term (current) drug therapy: Secondary | ICD-10-CM | POA: Diagnosis not present

## 2020-01-22 DIAGNOSIS — Z20822 Contact with and (suspected) exposure to covid-19: Secondary | ICD-10-CM | POA: Insufficient documentation

## 2020-01-22 DIAGNOSIS — E1140 Type 2 diabetes mellitus with diabetic neuropathy, unspecified: Secondary | ICD-10-CM | POA: Insufficient documentation

## 2020-01-22 HISTORY — DX: Occlusion and stenosis of left carotid artery: I65.22

## 2020-01-22 HISTORY — DX: Atherosclerotic heart disease of native coronary artery without angina pectoris: I25.10

## 2020-01-22 LAB — BLOOD GAS, ARTERIAL
Acid-Base Excess: 1.3 mmol/L (ref 0.0–2.0)
Bicarbonate: 24.7 mmol/L (ref 20.0–28.0)
FIO2: 21
O2 Saturation: 98.3 %
Patient temperature: 37
pCO2 arterial: 34.5 mmHg (ref 32.0–48.0)
pH, Arterial: 7.468 — ABNORMAL HIGH (ref 7.350–7.450)
pO2, Arterial: 106 mmHg (ref 83.0–108.0)

## 2020-01-22 LAB — HEMOGLOBIN A1C
Hgb A1c MFr Bld: 9.4 % — ABNORMAL HIGH (ref 4.8–5.6)
Mean Plasma Glucose: 223.08 mg/dL

## 2020-01-22 LAB — CBC
HCT: 46.2 % (ref 39.0–52.0)
Hemoglobin: 16.1 g/dL (ref 13.0–17.0)
MCH: 32.2 pg (ref 26.0–34.0)
MCHC: 34.8 g/dL (ref 30.0–36.0)
MCV: 92.4 fL (ref 80.0–100.0)
Platelets: 225 10*3/uL (ref 150–400)
RBC: 5 MIL/uL (ref 4.22–5.81)
RDW: 14 % (ref 11.5–15.5)
WBC: 5.8 10*3/uL (ref 4.0–10.5)
nRBC: 0 % (ref 0.0–0.2)

## 2020-01-22 LAB — COMPREHENSIVE METABOLIC PANEL
ALT: 22 U/L (ref 0–44)
AST: 22 U/L (ref 15–41)
Albumin: 4.2 g/dL (ref 3.5–5.0)
Alkaline Phosphatase: 61 U/L (ref 38–126)
Anion gap: 11 (ref 5–15)
BUN: 17 mg/dL (ref 8–23)
CO2: 29 mmol/L (ref 22–32)
Calcium: 9.5 mg/dL (ref 8.9–10.3)
Chloride: 97 mmol/L — ABNORMAL LOW (ref 98–111)
Creatinine, Ser: 0.87 mg/dL (ref 0.61–1.24)
GFR calc Af Amer: >60 mL/min (ref >60)
GFR calc non Af Amer: >60 mL/min (ref >60)
Glucose, Bld: 400 mg/dL — ABNORMAL HIGH (ref 70–99)
Potassium: 4.6 mmol/L (ref 3.5–5.1)
Sodium: 137 mmol/L (ref 135–145)
Total Bilirubin: 1.2 mg/dL (ref 0.3–1.2)
Total Protein: 7 g/dL (ref 6.5–8.1)

## 2020-01-22 LAB — GLUCOSE, CAPILLARY: Glucose-Capillary: 264 mg/dL — ABNORMAL HIGH (ref 70–99)

## 2020-01-22 LAB — APTT: aPTT: 27 seconds (ref 24–36)

## 2020-01-22 LAB — URINALYSIS, ROUTINE W REFLEX MICROSCOPIC
Bacteria, UA: NONE SEEN
Bilirubin Urine: NEGATIVE
Glucose, UA: >=500 mg/dL — AB
Hgb urine dipstick: NEGATIVE
Ketones, ur: NEGATIVE mg/dL
Leukocytes,Ua: NEGATIVE
Nitrite: NEGATIVE
Protein, ur: NEGATIVE mg/dL
Specific Gravity, Urine: 1.02 (ref 1.005–1.030)
pH: 5 (ref 5.0–8.0)

## 2020-01-22 LAB — SARS CORONAVIRUS 2 (TAT 6-24 HRS): SARS Coronavirus 2: NEGATIVE

## 2020-01-22 LAB — SURGICAL PCR SCREEN
MRSA, PCR: NEGATIVE
Staphylococcus aureus: NEGATIVE

## 2020-01-22 LAB — ABO/RH: ABO/RH(D): A POS

## 2020-01-22 LAB — PROTIME-INR
INR: 1.1 (ref 0.8–1.2)
Prothrombin Time: 13.3 seconds (ref 11.4–15.2)

## 2020-01-22 NOTE — Progress Notes (Signed)
PCP - Jeralyn Ruths Cardiologist - Dr. Bronson Ing Endocrine- Dr. Dorris Fetch   Chest x-ray - 01/22/20 EKG - 01/08/20 Stress Test - denies ECHO - 11/18/19 Cardiac Cath - 01/08/20  Sleep Study - denies   Fasting Blood Sugar - 90-240 Checks Blood Sugar 3 times a day  CBG at PAT 264. Pt states he felt his sugar was low and he had 2 apple pies to bring sugar up. Also took 6u of Novolog. DM coordinator consult requested.  Blood Thinner Instructions: Last dose of Plavix 01/19/20 Aspirin Instructions: Patient instructed to hold all  NSAID's, herbal medications, fish oil and vitamins 7 days prior to surgery.    COVID TEST-  01/22/20 pending results   Anesthesia review: cardiac history; uncontrolled DM  Patient denies shortness of breath, fever, cough and chest pain at PAT appointment   All instructions explained to the patient, with a verbal understanding of the material. Patient agrees to go over the instructions while at home for a better understanding. Patient also instructed to self quarantine after being tested for COVID-19. The opportunity to ask questions was provided.

## 2020-01-22 NOTE — Progress Notes (Signed)
Pre-CABG testing has been completed. Preliminary results can be found in CV Proc through chart review.   01/22/20 11:16 AM Mark Foster RVT

## 2020-01-23 ENCOUNTER — Ambulatory Visit (HOSPITAL_COMMUNITY)
Admission: RE | Admit: 2020-01-23 | Discharge: 2020-01-23 | Disposition: A | Discharge status: Home / Self Care | Payer: Medicare HMO | Source: Ambulatory Visit | Attending: Thoracic Surgery (Cardiothoracic Vascular Surgery) | Admitting: Thoracic Surgery (Cardiothoracic Vascular Surgery)

## 2020-01-23 ENCOUNTER — Encounter (HOSPITAL_COMMUNITY): Payer: Self-pay

## 2020-01-23 DIAGNOSIS — Z8673 Personal history of transient ischemic attack (TIA), and cerebral infarction without residual deficits: Secondary | ICD-10-CM | POA: Diagnosis not present

## 2020-01-23 DIAGNOSIS — E782 Mixed hyperlipidemia: Secondary | ICD-10-CM | POA: Diagnosis present

## 2020-01-23 DIAGNOSIS — E039 Hypothyroidism, unspecified: Secondary | ICD-10-CM | POA: Diagnosis present

## 2020-01-23 DIAGNOSIS — D696 Thrombocytopenia, unspecified: Secondary | ICD-10-CM | POA: Diagnosis not present

## 2020-01-23 DIAGNOSIS — E11649 Type 2 diabetes mellitus with hypoglycemia without coma: Secondary | ICD-10-CM | POA: Diagnosis not present

## 2020-01-23 DIAGNOSIS — I2510 Atherosclerotic heart disease of native coronary artery without angina pectoris: Secondary | ICD-10-CM | POA: Insufficient documentation

## 2020-01-23 DIAGNOSIS — Z9221 Personal history of antineoplastic chemotherapy: Secondary | ICD-10-CM | POA: Diagnosis not present

## 2020-01-23 DIAGNOSIS — M4802 Spinal stenosis, cervical region: Secondary | ICD-10-CM | POA: Diagnosis present

## 2020-01-23 DIAGNOSIS — Z800 Family history of malignant neoplasm of digestive organs: Secondary | ICD-10-CM | POA: Diagnosis not present

## 2020-01-23 DIAGNOSIS — J939 Pneumothorax, unspecified: Secondary | ICD-10-CM | POA: Diagnosis not present

## 2020-01-23 DIAGNOSIS — I371 Nonrheumatic pulmonary valve insufficiency: Secondary | ICD-10-CM | POA: Diagnosis not present

## 2020-01-23 DIAGNOSIS — I071 Rheumatic tricuspid insufficiency: Secondary | ICD-10-CM | POA: Diagnosis not present

## 2020-01-23 DIAGNOSIS — I10 Essential (primary) hypertension: Secondary | ICD-10-CM | POA: Diagnosis present

## 2020-01-23 DIAGNOSIS — I6523 Occlusion and stenosis of bilateral carotid arteries: Secondary | ICD-10-CM | POA: Diagnosis present

## 2020-01-23 DIAGNOSIS — I639 Cerebral infarction, unspecified: Secondary | ICD-10-CM | POA: Diagnosis not present

## 2020-01-23 DIAGNOSIS — Z8249 Family history of ischemic heart disease and other diseases of the circulatory system: Secondary | ICD-10-CM | POA: Diagnosis not present

## 2020-01-23 DIAGNOSIS — Z9081 Acquired absence of spleen: Secondary | ICD-10-CM | POA: Diagnosis not present

## 2020-01-23 DIAGNOSIS — Z833 Family history of diabetes mellitus: Secondary | ICD-10-CM | POA: Diagnosis not present

## 2020-01-23 DIAGNOSIS — I25110 Atherosclerotic heart disease of native coronary artery with unstable angina pectoris: Secondary | ICD-10-CM | POA: Diagnosis present

## 2020-01-23 DIAGNOSIS — D62 Acute posthemorrhagic anemia: Secondary | ICD-10-CM | POA: Diagnosis not present

## 2020-01-23 DIAGNOSIS — E1140 Type 2 diabetes mellitus with diabetic neuropathy, unspecified: Secondary | ICD-10-CM | POA: Diagnosis present

## 2020-01-23 DIAGNOSIS — Z90411 Acquired partial absence of pancreas: Secondary | ICD-10-CM | POA: Diagnosis not present

## 2020-01-23 DIAGNOSIS — Z8507 Personal history of malignant neoplasm of pancreas: Secondary | ICD-10-CM | POA: Diagnosis not present

## 2020-01-23 DIAGNOSIS — E8770 Fluid overload, unspecified: Secondary | ICD-10-CM | POA: Diagnosis not present

## 2020-01-23 DIAGNOSIS — I959 Hypotension, unspecified: Secondary | ICD-10-CM | POA: Diagnosis not present

## 2020-01-23 DIAGNOSIS — Z951 Presence of aortocoronary bypass graft: Secondary | ICD-10-CM | POA: Diagnosis not present

## 2020-01-23 DIAGNOSIS — Z794 Long term (current) use of insulin: Secondary | ICD-10-CM | POA: Diagnosis not present

## 2020-01-23 DIAGNOSIS — Z4682 Encounter for fitting and adjustment of non-vascular catheter: Secondary | ICD-10-CM | POA: Diagnosis not present

## 2020-01-23 DIAGNOSIS — Z823 Family history of stroke: Secondary | ICD-10-CM | POA: Diagnosis not present

## 2020-01-23 DIAGNOSIS — J9811 Atelectasis: Secondary | ICD-10-CM | POA: Diagnosis not present

## 2020-01-23 DIAGNOSIS — R000 Tachycardia, unspecified: Secondary | ICD-10-CM | POA: Diagnosis not present

## 2020-01-23 LAB — PULMONARY FUNCTION TEST
DL/VA % pred: 69 %
DL/VA: 2.89 ml/min/mmHg/L
DLCO cor % pred: 74 %
DLCO cor: 20.2 ml/min/mmHg
DLCO unc % pred: 77 %
DLCO unc: 21.01 ml/min/mmHg
FEF 25-75 Post: 2.58 L/sec
FEF 25-75 Pre: 2.38 L/sec
FEF2575-%Change-Post: 8 %
FEF2575-%Pred-Post: 93 %
FEF2575-%Pred-Pre: 85 %
FEV1-%Change-Post: 1 %
FEV1-%Pred-Post: 105 %
FEV1-%Pred-Pre: 104 %
FEV1-Post: 3.67 L
FEV1-Pre: 3.61 L
FEV1FVC-%Change-Post: 1 %
FEV1FVC-%Pred-Pre: 95 %
FEV6-%Change-Post: 0 %
FEV6-%Pred-Post: 111 %
FEV6-%Pred-Pre: 110 %
FEV6-Post: 4.9 L
FEV6-Pre: 4.88 L
FEV6FVC-%Change-Post: 0 %
FEV6FVC-%Pred-Post: 102 %
FEV6FVC-%Pred-Pre: 101 %
FVC-%Change-Post: 0 %
FVC-%Pred-Post: 108 %
FVC-%Pred-Pre: 109 %
FVC-Post: 5.05 L
FVC-Pre: 5.06 L
Post FEV1/FVC ratio: 73 %
Post FEV6/FVC ratio: 97 %
Pre FEV1/FVC ratio: 71 %
Pre FEV6/FVC Ratio: 96 %
RV % pred: 127 %
RV: 2.97 L
TLC % pred: 114 %
TLC: 8.01 L

## 2020-01-23 MED ORDER — EPINEPHRINE HCL 5 MG/250ML IV SOLN IN NS
0.0000 ug/min | INTRAVENOUS | Status: DC
  Filled 2020-01-23: qty 250

## 2020-01-23 MED ORDER — MILRINONE LACTATE IN DEXTROSE 20-5 MG/100ML-% IV SOLN
0.3000 ug/kg/min | INTRAVENOUS | Status: DC
  Filled 2020-01-23: qty 100

## 2020-01-23 MED ORDER — POTASSIUM CHLORIDE 2 MEQ/ML IV SOLN
80.0000 meq | INTRAVENOUS | Status: DC
  Filled 2020-01-23: qty 40

## 2020-01-23 MED ORDER — NOREPINEPHRINE 4 MG/250ML-% IV SOLN
0.0000 ug/min | INTRAVENOUS | Status: AC
  Administered 2020-01-26: 2 ug/min via INTRAVENOUS
  Filled 2020-01-23: qty 250

## 2020-01-23 MED ORDER — TRANEXAMIC ACID 1000 MG/10ML IV SOLN
1.5000 mg/kg/h | INTRAVENOUS | Status: AC
  Administered 2020-01-26: 1.5 mg/kg/h via INTRAVENOUS
  Filled 2020-01-23: qty 25

## 2020-01-23 MED ORDER — VANCOMYCIN HCL 1250 MG/250ML IV SOLN
1250.0000 mg | INTRAVENOUS | Status: AC
  Administered 2020-01-26: 1250 mg via INTRAVENOUS
  Filled 2020-01-23: qty 250

## 2020-01-23 MED ORDER — TRANEXAMIC ACID (OHS) BOLUS VIA INFUSION
15.0000 mg/kg | INTRAVENOUS | Status: AC
  Administered 2020-01-26: 1092 mg via INTRAVENOUS
  Filled 2020-01-23: qty 1092

## 2020-01-23 MED ORDER — MAGNESIUM SULFATE 50 % IJ SOLN
40.0000 meq | INTRAMUSCULAR | Status: DC
  Filled 2020-01-23: qty 9.85

## 2020-01-23 MED ORDER — INSULIN REGULAR(HUMAN) IN NACL 100-0.9 UT/100ML-% IV SOLN
INTRAVENOUS | Status: AC
  Administered 2020-01-26: 1 [IU]/h via INTRAVENOUS
  Filled 2020-01-23: qty 100

## 2020-01-23 MED ORDER — DEXMEDETOMIDINE HCL IN NACL 400 MCG/100ML IV SOLN
0.1000 ug/kg/h | INTRAVENOUS | Status: AC
  Administered 2020-01-26: .3 ug/kg/h via INTRAVENOUS
  Filled 2020-01-23: qty 100

## 2020-01-23 MED ORDER — PLASMA-LYTE 148 IV SOLN
INTRAVENOUS | Status: DC
  Filled 2020-01-23: qty 2.5

## 2020-01-23 MED ORDER — ALBUTEROL SULFATE (2.5 MG/3ML) 0.083% IN NEBU
2.5000 mg | INHALATION_SOLUTION | Freq: Once | RESPIRATORY_TRACT | Status: AC
  Administered 2020-01-23: 2.5 mg via RESPIRATORY_TRACT

## 2020-01-23 MED ORDER — SODIUM CHLORIDE 0.9 % IV SOLN
INTRAVENOUS | Status: DC
  Filled 2020-01-23: qty 30

## 2020-01-23 MED ORDER — SODIUM CHLORIDE 0.9 % IV SOLN
1.5000 g | INTRAVENOUS | Status: AC
  Administered 2020-01-26: 1.5 g via INTRAVENOUS
  Filled 2020-01-23: qty 1.5

## 2020-01-23 MED ORDER — PHENYLEPHRINE HCL-NACL 20-0.9 MG/250ML-% IV SOLN
30.0000 ug/min | INTRAVENOUS | Status: AC
  Administered 2020-01-26: 30 ug/min via INTRAVENOUS
  Filled 2020-01-23: qty 250

## 2020-01-23 MED ORDER — NITROGLYCERIN IN D5W 200-5 MCG/ML-% IV SOLN
2.0000 ug/min | INTRAVENOUS | Status: DC
  Filled 2020-01-23: qty 250

## 2020-01-23 MED ORDER — TRANEXAMIC ACID (OHS) PUMP PRIME SOLUTION
2.0000 mg/kg | INTRAVENOUS | Status: DC
  Filled 2020-01-23: qty 1.46

## 2020-01-23 MED ORDER — SODIUM CHLORIDE 0.9 % IV SOLN
750.0000 mg | INTRAVENOUS | Status: AC
  Administered 2020-01-26: 750 mg via INTRAVENOUS
  Filled 2020-01-23: qty 750

## 2020-01-23 NOTE — Anesthesia Preprocedure Evaluation (Deleted)
Anesthesia Evaluation    Airway        Dental   Pulmonary former smoker,           Cardiovascular      Neuro/Psych    GI/Hepatic   Endo/Other  diabetes  Renal/GU      Musculoskeletal   Abdominal   Peds  Hematology   Anesthesia Other Findings   Reproductive/Obstetrics                             Anesthesia Physical Anesthesia Plan  ASA:   Anesthesia Plan:    Post-op Pain Management:    Induction:   PONV Risk Score and Plan:   Airway Management Planned:   Additional Equipment:   Intra-op Plan:   Post-operative Plan:   Informed Consent:   Plan Discussed with:   Anesthesia Plan Comments: (PAT note written 01/23/2020 by Myra Gianotti, PA-C. )        Anesthesia Quick Evaluation

## 2020-01-23 NOTE — Progress Notes (Signed)
Anesthesia Chart Review:  Case: [id number] Date/Time: 01/26/20 0715   Procedures:      CORONARY ARTERY BYPASS GRAFTING (CABG) (N/A Chest)     TRANSESOPHAGEAL ECHOCARDIOGRAM (TEE) (N/A )   Anesthesia type: General   Pre-op diagnosis: CAD   Location: [facility] OR ROOM 17 / [place] [facility]   Surgeons: Melrose Nakayama, MD      DISCUSSION: Patient is a 64 year old male scheduled for the above procedure.  History includes former smoker, CAD, pancreatic cancer (stage IIb, s/p pancreatectomy, splenectomy 11/20/18, s/p 12 cycles FOLFIRINOX, last 06/04/19), CVA (10/08/09; left MCA stroke like episode, s/p tPA 01/19/18; 10/09/19), vertebral artery stenosis (s/p stent assisted angioplasty of left vertebral artery 03/21/18), carotid artery stenosis (occluded left ICA), DM2 (with neuropathy), hypothyroidism, diverticulitis (s/p colon resection 1995), cervical spinal stenosis, reported history cervical compression fracture  Patient with known poorly controlled DM. CBG at PAT 264, but up to 400 with BMET. Reportedly ate 2 apple pies shortly before PAT because he felt he was hypoglycemic. A1c 9.4 but down from 12.1% on 10/08/19. Will forward to Dr. Roxan Hockey.  PFTS pending. Last Plavix 01/19/2020. 01/22/20 presurgical COVID-19 test negative. Anesthesia team to evaluate on the day of surgery.    VS: BP 115/78   Pulse 82   Temp 36.9 C (Oral)   Resp 17   Wt 72.8 kg   SpO2 97%   BMI 23.02 kg/m    PROVIDERS: Ailene Ards, NP is PCP Kate Sable, MD is cardiologist Loni Beckwith, MD is endocrinologist. Last visit 11/25/19.  Antony Contras, MD is neurologist Derek Jack, MD is HEM-ONC Shon Hough, MD is general surgeon ([place])   LABS: Preoperative labs noted. See DISCUSSION. (all labs ordered are listed, but only abnormal results are displayed)  Labs Reviewed  GLUCOSE, CAPILLARY - Abnormal; Notable for the following components:      Result Value   Glucose-Capillary 264 (*)    All  other components within normal limits  BLOOD GAS, ARTERIAL - Abnormal; Notable for the following components:   pH, Arterial 7.468 (*)    All other components within normal limits  COMPREHENSIVE METABOLIC PANEL - Abnormal; Notable for the following components:   Chloride 97 (*)    Glucose, Bld 400 (*)    All other components within normal limits  HEMOGLOBIN A1C - Abnormal; Notable for the following components:   Hgb A1c MFr Bld 9.4 (*)    All other components within normal limits  URINALYSIS, ROUTINE W REFLEX MICROSCOPIC - Abnormal; Notable for the following components:   Glucose, UA >=500 (*)    All other components within normal limits  SURGICAL PCR SCREEN  APTT  CBC  PROTIME-INR  TYPE AND SCREEN  ABO/RH    OTHER: PFTS: Scheduled for 01/23/20 10:30 AM.  EEG 01/06/20: Summary  Normal electroencephalogram, awake, asleep and with activation procedures. There are no focal lateralizing or epileptiform features.    IMAGES: CXR 01/22/20: IMPRESSION: No acute cardiopulmonary disease. Hyperinflated lungs, suggesting COPD.  MRA Head 01/07/20: IMPRESSION: 1. Chronic occlusion of the left ICA with reconstitution of the supraclinoid segment. 2. Intracranial atherosclerosis with unchanged moderate right cavernous ICA, moderate right M1, and severe left A1 stenoses.  MRI C-spine 01/20/18: IMPRESSION: 1. Multilevel cervical spondylolysis with resultant moderate to severe diffuse spinal stenosis at C4-5 through C6-7, most severe at C5-6. 2. Subtle T2 signal abnormality within the left aspect of the cervical spinal cord at C5-6, suspicious for myelomalacia related to disc disease and stenosis. 3. Multifactorial degenerative  changes with resultant multilevel foraminal narrowing as above. Notable findings include severe bilateral C5 and C6 foraminal narrowing with moderate bilateral C7 foraminal stenosis.   EKG: 01/08/20: NSR   CV: Carotid US 01/22/20: Summary:  Right Carotid:  Velocities in the right ICA are consistent with a 1-39%  stenosis.  Left Carotid: Evidence consistent with a total occlusion of the left ICA.  Vertebrals: Bilateral vertebral arteries demonstrate antegrade flow.   Cardiac cath 01/08/20:  Ost LAD to Prox LAD lesion is 75% stenosed. Prox LAD to Mid LAD lesion is 55% stenosed -this segment was considered significant by CT FFR  Mid LAD to Dist LAD lesion is 40% stenosed.  Dist (apical) LAD lesion is 75% stenosed.  Prox Cx to Dist Cx lesion is 65% stenosed. Long irregular lesion-significant by CT FFR  Prox RCA to Mid RCA lesion is 75% stenosed. Mid RCA lesion is 80% stenosed -lesions in tandem significant by CT FFR  Dist RCA-1 lesion is 55% stenosed.  Dist RCA-2 lesion is 80% stenosed with 80% stenosed side branch in RPDA. Significant by CT FFR  LV end diastolic pressure is mildly elevated.  There is no aortic valve stenosis.  Severe multivessel CAD involving the proximal third of the LAD, mid LCx and mid-distal RCA confirming findings seen on coronary CTA -> recommend CABG  Mildly elevated LVEDP  Cardiac Event monitor 01/06/20:  Sinus rhythm and sinus tachycardia. No significant arrhythmias.  TEE 11/18/19: IMPRESSIONS  1. Left ventricular ejection fraction, by estimation, is 60 to 65%. The  left ventricle has normal function. The left ventricle has no regional  wall motion abnormalities.  2. Right ventricular systolic function is normal. The right ventricular  size is normal.  3. No left atrial/left atrial appendage thrombus was detected.  4. The mitral valve is grossly normal. No evidence of mitral valve  regurgitation.  5. The aortic valve is tricuspid. Aortic valve regurgitation is not  visualized. No aortic stenosis is present.  6. Agitated saline contrast bubble study was negative, with no evidence  of any interatrial shunt.    Past Medical History:  Diagnosis Date  . Carotid artery obstruction, left    Left ICA  occlusion  . Cervical compression fracture ([place])   . Cervical spinal stenosis   . Coronary artery disease   . Diabetic neuropathy (H[facility])    Bilateral legs  . Diverticulitis   . Family history of pancreatic cancer   . History of kidney stones   . Hypothyroidism   . Pancreatic cancer ([place])   . Stenosis of left vertebral artery   . Stroke (cerebrum) ([place]) 10/09/2019  . Stroke [facility])    2011  . Type 2 diabetes mellitus ([place])     Past Surgical History:  Procedure Laterality Date  . APPENDECTOMY    . BUBBLE STUDY N/A 11/18/2019   Procedure: BUBBLE STUDY;  Surgeon: Herminio Commons, MD;  Location: [facility]S;  Service: Cardiology;  Laterality: N/A;  . COLON RESECTION     For diverticulitis  . COLON SURGERY    . ESOPHAGOGASTRODUODENOSCOPY N/A 10/03/2018   Procedure: ESOPHAGOGASTRODUODENOSCOPY (EGD);  Surgeon: [name], MD;  Location: [name];  Service: Endoscopy;  Laterality: N/A;  . EUS N/A 10/03/2018   Procedure: UPPER ENDOSCOPIC ULTRASOUND (EUS) RADIAL;  Surgeon: [name], MD;  Location: [facility];  Service: Endoscopy;  Laterality: N/A;  . EYE SURGERY Bilateral   . FINE NEEDLE ASPIRATION N/A 10/03/2018   Procedure: FINE NEEDLE ASPIRATION (FNA) LINEAR;  Surgeon:  [name], MD;  Location: [name];  Service: Endoscopy;  Laterality: N/A;  . IR ANGIO INTRA EXTRACRAN SEL COM CAROTID INNOMINATE BILAT MOD SED  01/21/2018  . IR ANGIO INTRA EXTRACRAN SEL COM CAROTID INNOMINATE UNI R MOD SED  03/21/2018  . IR ANGIO VERTEBRAL SEL VERTEBRAL BILAT MOD SED  01/21/2018  . IR TRANSCATH EXCRAN VERT OR CAR A STENT  03/21/2018  . LEFT HEART CATH AND CORONARY ANGIOGRAPHY N/A 01/08/2020   Procedure: LEFT HEART CATH AND CORONARY ANGIOGRAPHY;  Surgeon: Leonie Man, MD;  Location: [facility] CV LAB;  Service: Cardiovascular;  Laterality: N/A;  . PORTACATH PLACEMENT Left 12/23/2018   Procedure: INSERTION PORT-A-CATH;  Surgeon: Virl Cagey, MD;  Location: [facility];  Service:  General;  Laterality: Left;  . RADIOLOGY WITH ANESTHESIA N/A 03/21/2018   Procedure: IR WITH ANESTHESIA WITH STENT PLACEMENT;  Surgeon: Luanne Bras, MD;  Location: [facility];  Service: Radiology;  Laterality: N/A;  . ROTATOR CUFF REPAIR     Left  . SPLENECTOMY    . TEE WITHOUT CARDIOVERSION N/A 11/18/2019   Procedure: TRANSESOPHAGEAL ECHOCARDIOGRAM (TEE) WITH PROPOFOL;  Surgeon: Herminio Commons, MD;  Location: [facility];  Service: Cardiology;  Laterality: N/A;  . TONSILLECTOMY      MEDICATIONS: . atorvastatin (LIPITOR) 80 MG tablet  . clopidogrel (PLAVIX) 75 MG tablet  . ECHINACEA EXTRACT PO  . ezetimibe (ZETIA) 10 MG tablet  . gabapentin (NEURONTIN) 300 MG capsule  . insulin aspart (NOVOLOG) 100 UNIT/ML FlexPen  . insulin glargine (LANTUS SOLOSTAR) 100 UNIT/ML Solostar Pen  . Insulin Pen Needle 32G X 4 MM MISC  . levothyroxine (SYNTHROID) 50 MCG tablet  . lidocaine-prilocaine (EMLA) cream  . loperamide (IMODIUM A-D) 2 MG tablet  . meclizine (ANTIVERT) 25 MG tablet  . metoprolol tartrate (LOPRESSOR) 25 MG tablet  . nitroGLYCERIN (NITROSTAT) 0.4 MG SL tablet  . Omega-3 1000 MG CAPS  . pantoprazole (PROTONIX) 20 MG tablet  . pantoprazole (PROTONIX) 20 MG tablet  . sertraline (ZOLOFT) 50 MG tablet  . tamsulosin (FLOMAX) 0.4 MG CAPS capsule  . traMADol (ULTRAM) 50 MG tablet  . vitamin B-12 (CYANOCOBALAMIN) 1000 MCG tablet   . levETIRAcetam (KEPPRA XR) 24 hr tablet 500 mg  . sodium chloride flush (NS) 0.9 % injection 3 mL   . 0.9 %  sodium chloride infusion  . 0.9 %  sodium chloride infusion  . albuterol (PROVENTIL) (2.5 MG/3ML) 0.083% nebulizer solution 2.5 mg  . [START ON 01/26/2020] cefUROXime (ZINACEF) 1.5 g in sodium chloride 0.9 % 100 mL IVPB  . [START ON 01/26/2020] cefUROXime (ZINACEF) 750 mg in sodium chloride 0.9 % 100 mL IVPB  . [START ON 01/26/2020] dexmedetomidine (PRECEDEX) 400 MCG/100ML (4 mcg/mL) infusion  . dextrose 5 % solution  . [START ON 01/26/2020]  EPINEPHrine (ADRENALIN) 4 mg in NS 250 mL (0.016 mg/mL) premix infusion  . [START ON 01/26/2020] heparin 30,000 units/NS 1000 mL solution for CELLSAVER  . heparin lock flush 100 unit/mL  . [START ON 01/26/2020] heparin sodium (porcine) 2,500 Units, papaverine 30 mg in electrolyte-148 (PLASMALYTE-148) 500 mL irrigation  . [START ON 01/26/2020] insulin regular, human (MYXREDLIN) 100 units/ 100 mL infusion  . [START ON 01/26/2020] magnesium sulfate (IV Push/IM) injection 40 mEq  . [START ON 01/26/2020] milrinone (PRIMACOR) 20 MG/100 ML (0.2 mg/mL) infusion  . [START ON 01/26/2020] nitroGLYCERIN 50 mg in dextrose 5 % 250 mL (0.2 mg/mL) infusion  . [START ON 01/26/2020] norepinephrine (LEVOPHED) 4mg  in 289mL premix infusion  . [  START ON 01/26/2020] phenylephrine (NEOSYNEPHRINE) 20-0.9 MG/250ML-% infusion  . [START ON 01/26/2020] potassium chloride injection 80 mEq  . sodium chloride flush (NS) 0.9 % injection 10 mL  . sodium chloride flush (NS) 0.9 % injection 10 mL  . [START ON 01/26/2020] tranexamic acid (CYKLOKAPRON) 2,500 mg in sodium chloride 0.9 % 250 mL (10 mg/mL) infusion  . [START ON 01/26/2020] tranexamic acid (CYKLOKAPRON) bolus via infusion - over 30 minutes 1,092 mg  . [START ON 01/26/2020] tranexamic acid (CYKLOKAPRON) pump prime solution 146 mg  . [START ON 01/26/2020] vancomycin (VANCOREADY) IVPB 1250 mg/250 mL    Myra Gianotti, PA-C Surgical Short Stay/Anesthesiology [facility] Phone [phone] [facility] Phone [phone] 01/23/2020 10:52 AM

## 2020-01-25 NOTE — Anesthesia Preprocedure Evaluation (Addendum)
Anesthesia Evaluation  Patient identified by MRN, date of birth, ID band Patient awake    Reviewed: Allergy & Precautions, NPO status , Patient's Chart, lab work & pertinent test results  History of Anesthesia Complications Negative for: history of anesthetic complications  Airway Mallampati: I  TM Distance: >3 FB Neck ROM: Full    Dental  (+) Edentulous Upper, Edentulous Lower, Dental Advisory Given   Pulmonary former smoker,    Pulmonary exam normal        Cardiovascular Exercise Tolerance: Poor METS: walks with walker. hypertension, Pt. on medications and Pt. on home beta blockers + Peripheral Vascular Disease  Normal cardiovascular exam  08-Oct-2019 19:59:59 [facility]-NLD ROUTINE RECORD Sinus rhythm Probable left atrial enlargement Minimal ST elevation, anterior leads  1. The left ventricle has normal systolic function, with an ejection fraction of 55-60%. The cavity size was normal. There is mild concentric left ventricular hypertrophy. Left ventricular diastolic parameters were normal. No evidence of left ventricular regional wall motion abnormalities.  2. The right ventricle has normal systolic function. The cavity was normal. There is no increase in right ventricular wall thickness.  3. The aortic root is normal in size and structure.    Neuro/Psych IMPRESSION: Numerous small areas of abnormal brain enhancement - including faint enhancement at the indeterminate right temporal lobe area described yesterday - although all of these more resemble post ischemic and infarct related enhancement than cerebral metastatic disease.  And there are definite underlying acute and chronic infarcts scattered throughout the brain. However, a repeat brain MRI without and with contrast is recommended (e.g. 6 to 12 weeks from now) to document expected post ischemic evolution of changes and exclude developing metastatic  disease.   Electronically Signed   By: Genevie Ann M.D.   On: 10/10/2019 10:00   Neuromuscular disease CVA (stroke - one month ago, no residual symptoms from recent stroke), Residual Symptoms negative psych ROS   GI/Hepatic negative GI ROS, (+)     substance abuse  alcohol use,   Endo/Other  diabetes, Well Controlled, Type 2, Oral Hypoglycemic Agents, Insulin DependentHypothyroidism   Renal/GU negative Renal ROS     Musculoskeletal Cervical spinal stenosis    Abdominal   Peds  Hematology   Anesthesia Other Findings Pancreatic cancer   Reproductive/Obstetrics negative OB ROS                            Anesthesia Physical  Anesthesia Plan  ASA: IV  Anesthesia Plan: General   Post-op Pain Management:    Induction: Intravenous  PONV Risk Score and Plan: 3 and Treatment may vary due to age or medical condition, Ondansetron and Dexamethasone  Airway Management Planned: Oral ETT  Additional Equipment: Arterial line, PA Cath, 3D TEE and Ultrasound Guidance Line Placement  Intra-op Plan:   Post-operative Plan: Post-operative intubation/ventilation  Informed Consent: I have reviewed the patients History and Physical, chart, labs and discussed the procedure including the risks, benefits and alternatives for the proposed anesthesia with the patient or authorized representative who has indicated his/her understanding and acceptance.     Dental advisory given  Plan Discussed with: Anesthesiologist and CRNA  Anesthesia Plan Comments:        Anesthesia Quick Evaluation

## 2020-01-26 ENCOUNTER — Inpatient Hospital Stay (HOSPITAL_COMMUNITY): Payer: Medicare HMO | Admitting: Certified Registered"

## 2020-01-26 ENCOUNTER — Other Ambulatory Visit: Payer: Self-pay

## 2020-01-26 ENCOUNTER — Encounter (HOSPITAL_COMMUNITY): Payer: Self-pay | Admitting: Thoracic Surgery (Cardiothoracic Vascular Surgery)

## 2020-01-26 ENCOUNTER — Inpatient Hospital Stay (HOSPITAL_COMMUNITY): Payer: Medicare HMO | Admitting: Vascular Surgery

## 2020-01-26 ENCOUNTER — Inpatient Hospital Stay (HOSPITAL_COMMUNITY)
Admission: RE | Admit: 2020-01-26 | Discharge: 2020-01-31 | DRG: 236 | Disposition: A | Discharge status: Home Health | Payer: Medicare HMO | Attending: Thoracic Surgery (Cardiothoracic Vascular Surgery) | Admitting: Thoracic Surgery (Cardiothoracic Vascular Surgery)

## 2020-01-26 ENCOUNTER — Inpatient Hospital Stay (HOSPITAL_COMMUNITY): Payer: Medicare HMO

## 2020-01-26 ENCOUNTER — Inpatient Hospital Stay (HOSPITAL_COMMUNITY)
Admission: RE | Disposition: A | Discharge status: Home Health | Payer: Self-pay | Source: Home / Self Care | Attending: Thoracic Surgery (Cardiothoracic Vascular Surgery)

## 2020-01-26 DIAGNOSIS — Z9221 Personal history of antineoplastic chemotherapy: Secondary | ICD-10-CM | POA: Diagnosis not present

## 2020-01-26 DIAGNOSIS — Z4682 Encounter for fitting and adjustment of non-vascular catheter: Secondary | ICD-10-CM

## 2020-01-26 DIAGNOSIS — E039 Hypothyroidism, unspecified: Secondary | ICD-10-CM | POA: Diagnosis present

## 2020-01-26 DIAGNOSIS — Z90411 Acquired partial absence of pancreas: Secondary | ICD-10-CM | POA: Diagnosis not present

## 2020-01-26 DIAGNOSIS — R000 Tachycardia, unspecified: Secondary | ICD-10-CM | POA: Diagnosis not present

## 2020-01-26 DIAGNOSIS — I25110 Atherosclerotic heart disease of native coronary artery with unstable angina pectoris: Principal | ICD-10-CM | POA: Diagnosis present

## 2020-01-26 DIAGNOSIS — I2510 Atherosclerotic heart disease of native coronary artery without angina pectoris: Secondary | ICD-10-CM

## 2020-01-26 DIAGNOSIS — Z8249 Family history of ischemic heart disease and other diseases of the circulatory system: Secondary | ICD-10-CM | POA: Diagnosis not present

## 2020-01-26 DIAGNOSIS — Z79899 Other long term (current) drug therapy: Secondary | ICD-10-CM

## 2020-01-26 DIAGNOSIS — D696 Thrombocytopenia, unspecified: Secondary | ICD-10-CM | POA: Diagnosis not present

## 2020-01-26 DIAGNOSIS — Z800 Family history of malignant neoplasm of digestive organs: Secondary | ICD-10-CM

## 2020-01-26 DIAGNOSIS — Z09 Encounter for follow-up examination after completed treatment for conditions other than malignant neoplasm: Secondary | ICD-10-CM

## 2020-01-26 DIAGNOSIS — E8770 Fluid overload, unspecified: Secondary | ICD-10-CM | POA: Diagnosis not present

## 2020-01-26 DIAGNOSIS — E782 Mixed hyperlipidemia: Secondary | ICD-10-CM | POA: Diagnosis present

## 2020-01-26 DIAGNOSIS — Z9081 Acquired absence of spleen: Secondary | ICD-10-CM

## 2020-01-26 DIAGNOSIS — Z833 Family history of diabetes mellitus: Secondary | ICD-10-CM | POA: Diagnosis not present

## 2020-01-26 DIAGNOSIS — Z951 Presence of aortocoronary bypass graft: Secondary | ICD-10-CM

## 2020-01-26 DIAGNOSIS — D62 Acute posthemorrhagic anemia: Secondary | ICD-10-CM | POA: Diagnosis not present

## 2020-01-26 DIAGNOSIS — Z7902 Long term (current) use of antithrombotics/antiplatelets: Secondary | ICD-10-CM

## 2020-01-26 DIAGNOSIS — E11649 Type 2 diabetes mellitus with hypoglycemia without coma: Secondary | ICD-10-CM | POA: Diagnosis not present

## 2020-01-26 DIAGNOSIS — I10 Essential (primary) hypertension: Secondary | ICD-10-CM | POA: Diagnosis present

## 2020-01-26 DIAGNOSIS — Z823 Family history of stroke: Secondary | ICD-10-CM | POA: Diagnosis not present

## 2020-01-26 DIAGNOSIS — Z8507 Personal history of malignant neoplasm of pancreas: Secondary | ICD-10-CM

## 2020-01-26 DIAGNOSIS — I6523 Occlusion and stenosis of bilateral carotid arteries: Secondary | ICD-10-CM | POA: Diagnosis present

## 2020-01-26 DIAGNOSIS — Z87891 Personal history of nicotine dependence: Secondary | ICD-10-CM

## 2020-01-26 DIAGNOSIS — J9811 Atelectasis: Secondary | ICD-10-CM | POA: Diagnosis not present

## 2020-01-26 DIAGNOSIS — I959 Hypotension, unspecified: Secondary | ICD-10-CM | POA: Diagnosis not present

## 2020-01-26 DIAGNOSIS — Z794 Long term (current) use of insulin: Secondary | ICD-10-CM

## 2020-01-26 DIAGNOSIS — E1140 Type 2 diabetes mellitus with diabetic neuropathy, unspecified: Secondary | ICD-10-CM | POA: Diagnosis present

## 2020-01-26 DIAGNOSIS — Z79890 Hormone replacement therapy (postmenopausal): Secondary | ICD-10-CM

## 2020-01-26 DIAGNOSIS — Z8673 Personal history of transient ischemic attack (TIA), and cerebral infarction without residual deficits: Secondary | ICD-10-CM | POA: Diagnosis not present

## 2020-01-26 DIAGNOSIS — M4802 Spinal stenosis, cervical region: Secondary | ICD-10-CM | POA: Diagnosis present

## 2020-01-26 HISTORY — PX: CORONARY ARTERY BYPASS GRAFT: SHX141

## 2020-01-26 HISTORY — PX: TEE WITHOUT CARDIOVERSION: SHX5443

## 2020-01-26 HISTORY — PX: ENDOVEIN HARVEST OF GREATER SAPHENOUS VEIN: SHX5059

## 2020-01-26 LAB — POCT I-STAT 7, (LYTES, BLD GAS, ICA,H+H)
Acid-Base Excess: 1 mmol/L (ref 0.0–2.0)
Acid-base deficit: 3 mmol/L — ABNORMAL HIGH (ref 0.0–2.0)
Acid-base deficit: 3 mmol/L — ABNORMAL HIGH (ref 0.0–2.0)
Acid-base deficit: 3 mmol/L — ABNORMAL HIGH (ref 0.0–2.0)
Bicarbonate: 26 mmol/L (ref 20.0–28.0)
Bicarbonate: 24 mmol/L (ref 20.0–28.0)
Bicarbonate: 23.4 mmol/L (ref 20.0–28.0)
Bicarbonate: 23.1 mmol/L (ref 20.0–28.0)
Calcium, Ion: 1.15 mmol/L (ref 1.15–1.40)
Calcium, Ion: 1.2 mmol/L (ref 1.15–1.40)
Calcium, Ion: 1.19 mmol/L (ref 1.15–1.40)
Calcium, Ion: 1.21 mmol/L (ref 1.15–1.40)
HCT: 32 % — ABNORMAL LOW (ref 39.0–52.0)
HCT: 35 % — ABNORMAL LOW (ref 39.0–52.0)
HCT: 34 % — ABNORMAL LOW (ref 39.0–52.0)
HCT: 35 % — ABNORMAL LOW (ref 39.0–52.0)
Hemoglobin: 10.9 g/dL — ABNORMAL LOW (ref 13.0–17.0)
Hemoglobin: 11.9 g/dL — ABNORMAL LOW (ref 13.0–17.0)
Hemoglobin: 11.6 g/dL — ABNORMAL LOW (ref 13.0–17.0)
Hemoglobin: 11.9 g/dL — ABNORMAL LOW (ref 13.0–17.0)
O2 Saturation: 99 %
O2 Saturation: 98 %
O2 Saturation: 98 %
O2 Saturation: 96 %
Patient temperature: 36.4
Patient temperature: 37.7
Patient temperature: 38.1
Patient temperature: 38
Potassium: 3.5 mmol/L (ref 3.5–5.1)
Potassium: 4.7 mmol/L (ref 3.5–5.1)
Potassium: 4.2 mmol/L (ref 3.5–5.1)
Potassium: 4.1 mmol/L (ref 3.5–5.1)
Sodium: 142 mmol/L (ref 135–145)
Sodium: 140 mmol/L (ref 135–145)
Sodium: 141 mmol/L (ref 135–145)
Sodium: 139 mmol/L (ref 135–145)
TCO2: 27 mmol/L (ref 22–32)
TCO2: 25 mmol/L (ref 22–32)
TCO2: 25 mmol/L (ref 22–32)
TCO2: 24 mmol/L (ref 22–32)
pCO2 arterial: 41.9 mmHg (ref 32.0–48.0)
pCO2 arterial: 51.4 mmHg — ABNORMAL HIGH (ref 32.0–48.0)
pCO2 arterial: 47 mmHg (ref 32.0–48.0)
pCO2 arterial: 45.9 mmHg (ref 32.0–48.0)
pH, Arterial: 7.398 (ref 7.350–7.450)
pH, Arterial: 7.28 — ABNORMAL LOW (ref 7.350–7.450)
pH, Arterial: 7.31 — ABNORMAL LOW (ref 7.350–7.450)
pH, Arterial: 7.314 — ABNORMAL LOW (ref 7.350–7.450)
pO2, Arterial: 145 mmHg — ABNORMAL HIGH (ref 83.0–108.0)
pO2, Arterial: 130 mmHg — ABNORMAL HIGH (ref 83.0–108.0)
pO2, Arterial: 129 mmHg — ABNORMAL HIGH (ref 83.0–108.0)
pO2, Arterial: 96 mmHg (ref 83.0–108.0)

## 2020-01-26 LAB — BASIC METABOLIC PANEL
Anion gap: 7 (ref 5–15)
BUN: 13 mg/dL (ref 8–23)
CO2: 21 mmol/L — ABNORMAL LOW (ref 22–32)
Calcium: 7.4 mg/dL — ABNORMAL LOW (ref 8.9–10.3)
Chloride: 110 mmol/L (ref 98–111)
Creatinine, Ser: 0.6 mg/dL — ABNORMAL LOW (ref 0.61–1.24)
GFR calc Af Amer: >60 mL/min (ref >60)
GFR calc non Af Amer: >60 mL/min (ref >60)
Glucose, Bld: 134 mg/dL — ABNORMAL HIGH (ref 70–99)
Potassium: 4.4 mmol/L (ref 3.5–5.1)
Sodium: 138 mmol/L (ref 135–145)

## 2020-01-26 LAB — APTT: aPTT: 34 seconds (ref 24–36)

## 2020-01-26 LAB — MAGNESIUM: Magnesium: 2.7 mg/dL — ABNORMAL HIGH (ref 1.7–2.4)

## 2020-01-26 LAB — GLUCOSE, CAPILLARY
Glucose-Capillary: 270 mg/dL — ABNORMAL HIGH (ref 70–99)
Glucose-Capillary: 106 mg/dL — ABNORMAL HIGH (ref 70–99)
Glucose-Capillary: 119 mg/dL — ABNORMAL HIGH (ref 70–99)
Glucose-Capillary: 100 mg/dL — ABNORMAL HIGH (ref 70–99)
Glucose-Capillary: 119 mg/dL — ABNORMAL HIGH (ref 70–99)
Glucose-Capillary: 110 mg/dL — ABNORMAL HIGH (ref 70–99)
Glucose-Capillary: 126 mg/dL — ABNORMAL HIGH (ref 70–99)
Glucose-Capillary: 132 mg/dL — ABNORMAL HIGH (ref 70–99)
Glucose-Capillary: 136 mg/dL — ABNORMAL HIGH (ref 70–99)
Glucose-Capillary: 133 mg/dL — ABNORMAL HIGH (ref 70–99)
Glucose-Capillary: 151 mg/dL — ABNORMAL HIGH (ref 70–99)

## 2020-01-26 LAB — PLATELET COUNT: Platelets: 109 10*3/uL — ABNORMAL LOW (ref 150–400)

## 2020-01-26 LAB — CBC
HCT: 35.7 % — ABNORMAL LOW (ref 39.0–52.0)
HCT: 35.5 % — ABNORMAL LOW (ref 39.0–52.0)
Hemoglobin: 12.3 g/dL — ABNORMAL LOW (ref 13.0–17.0)
Hemoglobin: 12.1 g/dL — ABNORMAL LOW (ref 13.0–17.0)
MCH: 32.1 pg (ref 26.0–34.0)
MCH: 32.3 pg (ref 26.0–34.0)
MCHC: 34.5 g/dL (ref 30.0–36.0)
MCHC: 34.1 g/dL (ref 30.0–36.0)
MCV: 93.2 fL (ref 80.0–100.0)
MCV: 94.7 fL (ref 80.0–100.0)
Platelets: 79 10*3/uL — ABNORMAL LOW (ref 150–400)
Platelets: 93 10*3/uL — ABNORMAL LOW (ref 150–400)
RBC: 3.83 MIL/uL — ABNORMAL LOW (ref 4.22–5.81)
RBC: 3.75 MIL/uL — ABNORMAL LOW (ref 4.22–5.81)
RDW: 14 % (ref 11.5–15.5)
RDW: 14.3 % (ref 11.5–15.5)
WBC: 7.8 10*3/uL (ref 4.0–10.5)
WBC: 6.9 10*3/uL (ref 4.0–10.5)
nRBC: 0 % (ref 0.0–0.2)
nRBC: 0 % (ref 0.0–0.2)

## 2020-01-26 LAB — HEMOGLOBIN AND HEMATOCRIT, BLOOD
HCT: 30.3 % — ABNORMAL LOW (ref 39.0–52.0)
Hemoglobin: 10.4 g/dL — ABNORMAL LOW (ref 13.0–17.0)

## 2020-01-26 LAB — PROTIME-INR
INR: 1.4 — ABNORMAL HIGH (ref 0.8–1.2)
Prothrombin Time: 16.5 seconds — ABNORMAL HIGH (ref 11.4–15.2)

## 2020-01-26 LAB — ECHO INTRAOPERATIVE TEE
Height: 70 in
Weight: 2608 oz

## 2020-01-26 LAB — PREPARE RBC (CROSSMATCH)

## 2020-01-26 SURGERY — CORONARY ARTERY BYPASS GRAFTING (CABG)
Anesthesia: General | Site: Leg Lower | Laterality: Right

## 2020-01-26 MED ORDER — METOPROLOL TARTRATE 25 MG/10 ML ORAL SUSPENSION
12.5000 mg | Freq: Two times a day (BID) | ORAL | Status: DC
  Filled 2020-01-26 (×2): qty 5

## 2020-01-26 MED ORDER — ORAL CARE MOUTH RINSE
15.0000 mL | Freq: Two times a day (BID) | OROMUCOSAL | Status: DC
  Administered 2020-01-26 – 2020-01-31 (×5): 15 mL via OROMUCOSAL

## 2020-01-26 MED ORDER — ORAL CARE MOUTH RINSE
15.0000 mL | OROMUCOSAL | Status: DC
  Administered 2020-01-26 (×2): 15 mL via OROMUCOSAL

## 2020-01-26 MED ORDER — HEMOSTATIC AGENTS (NO CHARGE) OPTIME
TOPICAL | Status: DC | PRN
  Administered 2020-01-26: 1 via TOPICAL

## 2020-01-26 MED ORDER — 0.9 % SODIUM CHLORIDE (POUR BTL) OPTIME
TOPICAL | Status: DC | PRN
  Administered 2020-01-26 (×5): 1000 mL

## 2020-01-26 MED ORDER — CHLORHEXIDINE GLUCONATE 4 % EX LIQD: 30.0000 mL | CUTANEOUS | Status: DC

## 2020-01-26 MED ORDER — LEVOTHYROXINE SODIUM 50 MCG PO TABS
50.0000 ug | ORAL_TABLET | Freq: Every day | ORAL | Status: DC
  Administered 2020-01-27 – 2020-01-31 (×5): 50 ug via ORAL
  Filled 2020-01-26 (×5): qty 1

## 2020-01-26 MED ORDER — HEPARIN SODIUM (PORCINE) 1000 UNIT/ML IJ SOLN
INTRAMUSCULAR | Status: DC | PRN
  Administered 2020-01-26: 2000 [IU] via INTRAVENOUS
  Administered 2020-01-26: 24000 [IU] via INTRAVENOUS

## 2020-01-26 MED ORDER — PROPOFOL 10 MG/ML IV BOLUS
INTRAVENOUS | Status: AC
  Filled 2020-01-26: qty 40

## 2020-01-26 MED ORDER — FAMOTIDINE IN NACL 20-0.9 MG/50ML-% IV SOLN
INTRAVENOUS | Status: AC
  Administered 2020-01-26: 20 mg via INTRAVENOUS
  Filled 2020-01-26: qty 50

## 2020-01-26 MED ORDER — VANCOMYCIN HCL IN DEXTROSE 1-5 GM/200ML-% IV SOLN
1000.0000 mg | Freq: Once | INTRAVENOUS | Status: AC
  Administered 2020-01-26: 1000 mg via INTRAVENOUS
  Filled 2020-01-26: qty 200

## 2020-01-26 MED ORDER — GABAPENTIN 300 MG PO CAPS
300.0000 mg | ORAL_CAPSULE | Freq: Two times a day (BID) | ORAL | Status: DC
  Administered 2020-01-27 – 2020-01-31 (×9): 300 mg via ORAL
  Filled 2020-01-26 (×9): qty 1

## 2020-01-26 MED ORDER — ACETAMINOPHEN 160 MG/5ML PO SOLN: 1000.0000 mg | Freq: Four times a day (QID) | ORAL | Status: DC

## 2020-01-26 MED ORDER — SODIUM CHLORIDE 0.9% FLUSH: 10.0000 mL | INTRAVENOUS | Status: DC | PRN

## 2020-01-26 MED ORDER — ONDANSETRON HCL 4 MG/2ML IJ SOLN: 4.0000 mg | Freq: Four times a day (QID) | INTRAMUSCULAR | Status: DC | PRN

## 2020-01-26 MED ORDER — EZETIMIBE 10 MG PO TABS
10.0000 mg | ORAL_TABLET | Freq: Every day | ORAL | Status: DC
  Administered 2020-01-27 – 2020-01-31 (×5): 10 mg via ORAL
  Filled 2020-01-26 (×5): qty 1

## 2020-01-26 MED ORDER — SODIUM CHLORIDE 0.9 % IV SOLN: 250.0000 mL | INTRAVENOUS | Status: DC

## 2020-01-26 MED ORDER — DEXMEDETOMIDINE HCL IN NACL 400 MCG/100ML IV SOLN
0.0000 ug/kg/h | INTRAVENOUS | Status: DC
  Administered 2020-01-26: 0.7 ug/kg/h via INTRAVENOUS
  Filled 2020-01-26: qty 100

## 2020-01-26 MED ORDER — OXYCODONE HCL 5 MG PO TABS
5.0000 mg | ORAL_TABLET | ORAL | Status: DC | PRN
  Administered 2020-01-26 – 2020-01-31 (×13): 10 mg via ORAL
  Filled 2020-01-26 (×13): qty 2

## 2020-01-26 MED ORDER — SODIUM CHLORIDE 0.9 % IV SOLN: INTRAVENOUS | Status: DC

## 2020-01-26 MED ORDER — SODIUM CHLORIDE 0.9% FLUSH
10.0000 mL | Freq: Two times a day (BID) | INTRAVENOUS | Status: DC
  Administered 2020-01-26 – 2020-01-28 (×2): 10 mL

## 2020-01-26 MED ORDER — ALBUMIN HUMAN 5 % IV SOLN
250.0000 mL | INTRAVENOUS | Status: AC | PRN
  Administered 2020-01-26 (×4): 12.5 g via INTRAVENOUS
  Filled 2020-01-26 (×2): qty 250

## 2020-01-26 MED ORDER — ASPIRIN EC 325 MG PO TBEC
325.0000 mg | DELAYED_RELEASE_TABLET | Freq: Every day | ORAL | Status: DC
  Administered 2020-01-27 – 2020-01-29 (×3): 325 mg via ORAL
  Filled 2020-01-26 (×3): qty 1

## 2020-01-26 MED ORDER — CHLORHEXIDINE GLUCONATE 0.12 % MT SOLN
15.0000 mL | OROMUCOSAL | Status: AC
  Administered 2020-01-26: 15 mL via OROMUCOSAL

## 2020-01-26 MED ORDER — ACETAMINOPHEN 650 MG RE SUPP
650.0000 mg | Freq: Once | RECTAL | Status: AC
  Administered 2020-01-26: 650 mg via RECTAL

## 2020-01-26 MED ORDER — ACETAMINOPHEN 160 MG/5ML PO SOLN: 650.0000 mg | Freq: Once | ORAL | Status: AC

## 2020-01-26 MED ORDER — ASPIRIN 81 MG PO CHEW: 324.0000 mg | CHEWABLE_TABLET | Freq: Every day | ORAL | Status: DC

## 2020-01-26 MED ORDER — LACTATED RINGERS IV SOLN: INTRAVENOUS | Status: DC | PRN

## 2020-01-26 MED ORDER — ROCURONIUM BROMIDE 10 MG/ML (PF) SYRINGE
PREFILLED_SYRINGE | INTRAVENOUS | Status: DC | PRN
  Administered 2020-01-26 (×3): 50 mg via INTRAVENOUS
  Administered 2020-01-26: 100 mg via INTRAVENOUS
  Administered 2020-01-26: 50 mg via INTRAVENOUS

## 2020-01-26 MED ORDER — FAMOTIDINE IN NACL 20-0.9 MG/50ML-% IV SOLN: 20.0000 mg | Freq: Two times a day (BID) | INTRAVENOUS | Status: AC

## 2020-01-26 MED ORDER — FENTANYL CITRATE (PF) 250 MCG/5ML IJ SOLN
INTRAMUSCULAR | Status: AC
  Filled 2020-01-26: qty 25

## 2020-01-26 MED ORDER — MIDAZOLAM HCL 5 MG/5ML IJ SOLN
INTRAMUSCULAR | Status: DC | PRN
  Administered 2020-01-26 (×5): 2 mg via INTRAVENOUS

## 2020-01-26 MED ORDER — SODIUM CHLORIDE 0.9% FLUSH
3.0000 mL | Freq: Two times a day (BID) | INTRAVENOUS | Status: DC
  Administered 2020-01-27 (×2): 3 mL via INTRAVENOUS

## 2020-01-26 MED ORDER — MIDAZOLAM HCL (PF) 10 MG/2ML IJ SOLN
INTRAMUSCULAR | Status: AC
  Filled 2020-01-26: qty 2

## 2020-01-26 MED ORDER — NITROGLYCERIN IN D5W 200-5 MCG/ML-% IV SOLN: 0.0000 ug/min | INTRAVENOUS | Status: DC

## 2020-01-26 MED ORDER — DOCUSATE SODIUM 100 MG PO CAPS
200.0000 mg | ORAL_CAPSULE | Freq: Every day | ORAL | Status: DC
  Administered 2020-01-27 – 2020-01-29 (×3): 200 mg via ORAL
  Filled 2020-01-26 (×4): qty 2

## 2020-01-26 MED ORDER — PROTAMINE SULFATE 10 MG/ML IV SOLN: INTRAVENOUS | Status: DC | PRN

## 2020-01-26 MED ORDER — POTASSIUM CHLORIDE 10 MEQ/50ML IV SOLN
10.0000 meq | INTRAVENOUS | Status: AC
  Administered 2020-01-26 (×3): 10 meq via INTRAVENOUS

## 2020-01-26 MED ORDER — SODIUM CHLORIDE 0.9% IV SOLUTION: Freq: Once | INTRAVENOUS | Status: DC

## 2020-01-26 MED ORDER — CHLORHEXIDINE GLUCONATE 0.12% ORAL RINSE (MEDLINE KIT)
15.0000 mL | Freq: Two times a day (BID) | OROMUCOSAL | Status: DC
  Administered 2020-01-26: 15 mL via OROMUCOSAL

## 2020-01-26 MED ORDER — CHLORHEXIDINE GLUCONATE 0.12 % MT SOLN
15.0000 mL | Freq: Once | OROMUCOSAL | Status: AC
  Administered 2020-01-26: 15 mL via OROMUCOSAL
  Filled 2020-01-26: qty 15

## 2020-01-26 MED ORDER — MORPHINE SULFATE (PF) 2 MG/ML IV SOLN
1.0000 mg | INTRAVENOUS | Status: DC | PRN
  Administered 2020-01-26: 1 mg via INTRAVENOUS
  Administered 2020-01-27 (×3): 2 mg via INTRAVENOUS
  Filled 2020-01-26 (×4): qty 1

## 2020-01-26 MED ORDER — SODIUM CHLORIDE (PF) 0.9 % IJ SOLN
OROMUCOSAL | Status: DC | PRN
  Administered 2020-01-26 (×3): 4 mL via TOPICAL

## 2020-01-26 MED ORDER — LACTATED RINGERS IV SOLN
INTRAVENOUS | Status: DC | PRN
Start: 2020-01-26 — End: 2020-01-26

## 2020-01-26 MED ORDER — SERTRALINE HCL 50 MG PO TABS
50.0000 mg | ORAL_TABLET | Freq: Every day | ORAL | Status: DC
  Administered 2020-01-27 – 2020-01-31 (×5): 50 mg via ORAL
  Filled 2020-01-26 (×5): qty 1

## 2020-01-26 MED ORDER — TRAMADOL HCL 50 MG PO TABS
50.0000 mg | ORAL_TABLET | ORAL | Status: DC | PRN
  Administered 2020-01-27 – 2020-01-28 (×4): 100 mg via ORAL
  Filled 2020-01-26 (×4): qty 2

## 2020-01-26 MED ORDER — METOPROLOL TARTRATE 5 MG/5ML IV SOLN: 2.5000 mg | INTRAVENOUS | Status: DC | PRN

## 2020-01-26 MED ORDER — INSULIN REGULAR(HUMAN) IN NACL 100-0.9 UT/100ML-% IV SOLN: INTRAVENOUS | Status: DC

## 2020-01-26 MED ORDER — FENTANYL CITRATE (PF) 250 MCG/5ML IJ SOLN
INTRAMUSCULAR | Status: DC | PRN
  Administered 2020-01-26 (×2): 50 ug via INTRAVENOUS
  Administered 2020-01-26: 100 ug via INTRAVENOUS
  Administered 2020-01-26: 50 ug via INTRAVENOUS
  Administered 2020-01-26: 100 ug via INTRAVENOUS
  Administered 2020-01-26: 150 ug via INTRAVENOUS
  Administered 2020-01-26: 50 ug via INTRAVENOUS
  Administered 2020-01-26: 150 ug via INTRAVENOUS
  Administered 2020-01-26: 200 ug via INTRAVENOUS
  Administered 2020-01-26: 100 ug via INTRAVENOUS
  Administered 2020-01-26: 250 ug via INTRAVENOUS

## 2020-01-26 MED ORDER — PROTAMINE SULFATE 10 MG/ML IV SOLN
INTRAVENOUS | Status: DC | PRN
  Administered 2020-01-26 (×6): 50 mg via INTRAVENOUS

## 2020-01-26 MED ORDER — LACTATED RINGERS IV SOLN: INTRAVENOUS | Status: DC

## 2020-01-26 MED ORDER — SODIUM CHLORIDE 0.9% FLUSH: 3.0000 mL | INTRAVENOUS | Status: DC | PRN

## 2020-01-26 MED ORDER — BISACODYL 10 MG RE SUPP: 10.0000 mg | Freq: Every day | RECTAL | Status: DC

## 2020-01-26 MED ORDER — PANTOPRAZOLE SODIUM 40 MG PO TBEC
40.0000 mg | DELAYED_RELEASE_TABLET | Freq: Every day | ORAL | Status: DC
  Administered 2020-01-28 – 2020-01-31 (×4): 40 mg via ORAL
  Filled 2020-01-26 (×4): qty 1

## 2020-01-26 MED ORDER — MIDAZOLAM HCL 2 MG/2ML IJ SOLN: 2.0000 mg | INTRAMUSCULAR | Status: DC | PRN

## 2020-01-26 MED ORDER — METOPROLOL TARTRATE 12.5 MG HALF TABLET
12.5000 mg | ORAL_TABLET | Freq: Once | ORAL | Status: DC
  Filled 2020-01-26: qty 1

## 2020-01-26 MED ORDER — SODIUM CHLORIDE 0.45 % IV SOLN: INTRAVENOUS | Status: DC | PRN

## 2020-01-26 MED ORDER — LIDOCAINE 2% (20 MG/ML) 5 ML SYRINGE
INTRAMUSCULAR | Status: DC | PRN
  Administered 2020-01-26: 100 mg via INTRAVENOUS

## 2020-01-26 MED ORDER — DEXTROSE 50 % IV SOLN: 0.0000 mL | INTRAVENOUS | Status: DC | PRN

## 2020-01-26 MED ORDER — ACETAMINOPHEN 500 MG PO TABS
1000.0000 mg | ORAL_TABLET | Freq: Four times a day (QID) | ORAL | Status: DC
  Administered 2020-01-26 – 2020-01-30 (×17): 1000 mg via ORAL
  Filled 2020-01-26 (×16): qty 2

## 2020-01-26 MED ORDER — PHENYLEPHRINE HCL-NACL 20-0.9 MG/250ML-% IV SOLN
0.0000 ug/min | INTRAVENOUS | Status: DC
  Administered 2020-01-26: 20 ug/min via INTRAVENOUS
  Administered 2020-01-27: 10 ug/min via INTRAVENOUS
  Filled 2020-01-26 (×2): qty 250

## 2020-01-26 MED ORDER — ATORVASTATIN CALCIUM 80 MG PO TABS
80.0000 mg | ORAL_TABLET | Freq: Every day | ORAL | Status: DC
  Administered 2020-01-27 – 2020-01-30 (×4): 80 mg via ORAL
  Filled 2020-01-26 (×4): qty 1

## 2020-01-26 MED ORDER — PROPOFOL 10 MG/ML IV BOLUS
INTRAVENOUS | Status: DC | PRN
  Administered 2020-01-26: 80 mg via INTRAVENOUS

## 2020-01-26 MED ORDER — SODIUM CHLORIDE 0.9 % IV SOLN
1.5000 g | Freq: Two times a day (BID) | INTRAVENOUS | Status: AC
  Administered 2020-01-26 – 2020-01-28 (×4): 1.5 g via INTRAVENOUS
  Filled 2020-01-26 (×4): qty 1.5

## 2020-01-26 MED ORDER — LACTATED RINGERS IV SOLN
500.0000 mL | Freq: Once | INTRAVENOUS | Status: AC | PRN
  Administered 2020-01-26: 500 mL via INTRAVENOUS

## 2020-01-26 MED ORDER — SODIUM CHLORIDE 0.9 % IV SOLN: INTRAVENOUS | Status: DC | PRN

## 2020-01-26 MED ORDER — CHLORHEXIDINE GLUCONATE CLOTH 2 % EX PADS
6.0000 | MEDICATED_PAD | Freq: Every day | CUTANEOUS | Status: DC
  Administered 2020-01-26 – 2020-01-28 (×3): 6 via TOPICAL

## 2020-01-26 MED ORDER — BISACODYL 5 MG PO TBEC
10.0000 mg | DELAYED_RELEASE_TABLET | Freq: Every day | ORAL | Status: DC
  Administered 2020-01-27 – 2020-01-29 (×3): 10 mg via ORAL
  Filled 2020-01-26 (×4): qty 2

## 2020-01-26 MED ORDER — METOPROLOL TARTRATE 12.5 MG HALF TABLET
12.5000 mg | ORAL_TABLET | Freq: Two times a day (BID) | ORAL | Status: DC
  Administered 2020-01-27 – 2020-01-28 (×3): 12.5 mg via ORAL
  Filled 2020-01-26 (×4): qty 1

## 2020-01-26 MED ORDER — PLASMA-LYTE 148 IV SOLN
INTRAVENOUS | Status: DC | PRN
  Administered 2020-01-26: 500 mL via INTRAVASCULAR

## 2020-01-26 MED ORDER — TAMSULOSIN HCL 0.4 MG PO CAPS
0.4000 mg | ORAL_CAPSULE | Freq: Every day | ORAL | Status: DC
  Administered 2020-01-27 – 2020-01-31 (×5): 0.4 mg via ORAL
  Filled 2020-01-26 (×5): qty 1

## 2020-01-26 MED ORDER — PHENYLEPHRINE 40 MCG/ML (10ML) SYRINGE FOR IV PUSH (FOR BLOOD PRESSURE SUPPORT)
PREFILLED_SYRINGE | INTRAVENOUS | Status: DC | PRN
  Administered 2020-01-26: 80 ug via INTRAVENOUS
  Administered 2020-01-26: 40 ug via INTRAVENOUS
  Administered 2020-01-26: 120 ug via INTRAVENOUS
  Administered 2020-01-26 (×3): 40 ug via INTRAVENOUS

## 2020-01-26 MED ORDER — MAGNESIUM SULFATE 4 GM/100ML IV SOLN
4.0000 g | Freq: Once | INTRAVENOUS | Status: AC
  Administered 2020-01-26: 4 g via INTRAVENOUS
  Filled 2020-01-26: qty 100

## 2020-01-26 MED ORDER — ARTIFICIAL TEARS OPHTHALMIC OINT
TOPICAL_OINTMENT | OPHTHALMIC | Status: DC | PRN
  Administered 2020-01-26: 1 via OPHTHALMIC

## 2020-01-26 SURGICAL SUPPLY — 102 items
BAG DECANTER FOR FLEXI CONT (MISCELLANEOUS) ×4 IMPLANT
BLADE CLIPPER SURG (BLADE) ×4 IMPLANT
BLADE STERNUM SYSTEM 6 (BLADE) ×3 IMPLANT
BNDG ELASTIC 4X5.8 VLCR STR LF (GAUZE/BANDAGES/DRESSINGS) ×4 IMPLANT
BNDG ELASTIC 6X15 VLCR STRL LF (GAUZE/BANDAGES/DRESSINGS) ×1 IMPLANT
BNDG ELASTIC 6X5.8 VLCR STR LF (GAUZE/BANDAGES/DRESSINGS) ×4 IMPLANT
BNDG GAUZE ELAST 4 BULKY (GAUZE/BANDAGES/DRESSINGS) ×4 IMPLANT
CANISTER SUCT 3000ML PPV (MISCELLANEOUS) ×4 IMPLANT
CANNULA EZ GLIDE AORTIC 21FR (CANNULA) ×4 IMPLANT
CATH CPB KIT HENDRICKSON (MISCELLANEOUS) ×4 IMPLANT
CATH ROBINSON RED A/P 18FR (CATHETERS) ×4 IMPLANT
CATH THORACIC 36FR (CATHETERS) ×4 IMPLANT
CATH THORACIC 36FR RT ANG (CATHETERS) ×4 IMPLANT
CLIP FOGARTY SPRING 6M (CLIP) ×1 IMPLANT
CLIP VESOCCLUDE MED 24/CT (CLIP) IMPLANT
CLIP VESOCCLUDE SM WIDE 24/CT (CLIP) ×2 IMPLANT
DERMABOND ADVANCED (GAUZE/BANDAGES/DRESSINGS) ×1
DERMABOND ADVANCED .7 DNX12 (GAUZE/BANDAGES/DRESSINGS) IMPLANT
DRAPE CARDIOVASCULAR INCISE (DRAPES) ×1
DRAPE SLUSH/WARMER DISC (DRAPES) ×4 IMPLANT
DRAPE SRG 135X102X78XABS (DRAPES) ×3 IMPLANT
DRSG COVADERM 4X14 (GAUZE/BANDAGES/DRESSINGS) ×4 IMPLANT
DRSG COVADERM 4X8 (GAUZE/BANDAGES/DRESSINGS) ×1 IMPLANT
ELECT REM PT RETURN 9FT ADLT (ELECTROSURGICAL) ×8
ELECTRODE REM PT RTRN 9FT ADLT (ELECTROSURGICAL) ×6 IMPLANT
FELT TEFLON 1X6 (MISCELLANEOUS) ×8 IMPLANT
GAUZE SPONGE 4X4 12PLY STRL (GAUZE/BANDAGES/DRESSINGS) ×8 IMPLANT
GAUZE SPONGE 4X4 12PLY STRL LF (GAUZE/BANDAGES/DRESSINGS) ×3 IMPLANT
GLOVE BIO SURGEON STRL SZ 6 (GLOVE) ×4 IMPLANT
GLOVE BIO SURGEON STRL SZ 6.5 (GLOVE) ×2 IMPLANT
GLOVE BIO SURGEON STRL SZ7.5 (GLOVE) ×2 IMPLANT
GLOVE BIOGEL PI IND STRL 6.5 (GLOVE) IMPLANT
GLOVE BIOGEL PI IND STRL 7.0 (GLOVE) IMPLANT
GLOVE BIOGEL PI IND STRL 7.5 (GLOVE) IMPLANT
GLOVE BIOGEL PI IND STRL 8.5 (GLOVE) IMPLANT
GLOVE BIOGEL PI INDICATOR 6.5 (GLOVE) ×1
GLOVE BIOGEL PI INDICATOR 7.0 (GLOVE) ×2
GLOVE BIOGEL PI INDICATOR 7.5 (GLOVE) ×1
GLOVE BIOGEL PI INDICATOR 8.5 (GLOVE) ×1
GLOVE ECLIPSE 7.0 STRL STRAW (GLOVE) ×2 IMPLANT
GLOVE SS BIOGEL STRL SZ 7 (GLOVE) IMPLANT
GLOVE SUPERSENSE BIOGEL SZ 7 (GLOVE) ×1
GLOVE SURG SIGNA 7.5 PF LTX (GLOVE) ×12 IMPLANT
GOWN STRL REUS W/ TWL LRG LVL3 (GOWN DISPOSABLE) ×12 IMPLANT
GOWN STRL REUS W/ TWL XL LVL3 (GOWN DISPOSABLE) ×6 IMPLANT
GOWN STRL REUS W/TWL LRG LVL3 (GOWN DISPOSABLE) ×10
GOWN STRL REUS W/TWL XL LVL3 (GOWN DISPOSABLE) ×2
HEMOSTAT POWDER SURGIFOAM 1G (HEMOSTASIS) ×12 IMPLANT
HEMOSTAT SURGICEL 2X14 (HEMOSTASIS) ×4 IMPLANT
INSERT FOGARTY XLG (MISCELLANEOUS) IMPLANT
KIT BASIN OR (CUSTOM PROCEDURE TRAY) ×4 IMPLANT
KIT SUCTION CATH 14FR (SUCTIONS) ×9 IMPLANT
KIT TURNOVER KIT B (KITS) ×4 IMPLANT
KIT VASOVIEW HEMOPRO 2 VH 4000 (KITS) ×4 IMPLANT
MARKER GRAFT CORONARY BYPASS (MISCELLANEOUS) ×12 IMPLANT
MARKER SKIN DUAL TIP RULER LAB (MISCELLANEOUS) ×1 IMPLANT
NS IRRIG 1000ML POUR BTL (IV SOLUTION) ×20 IMPLANT
PACK E OPEN HEART (SUTURE) ×4 IMPLANT
PACK OPEN HEART (CUSTOM PROCEDURE TRAY) ×4 IMPLANT
PAD ARMBOARD 7.5X6 YLW CONV (MISCELLANEOUS) ×8 IMPLANT
PAD ELECT DEFIB RADIOL ZOLL (MISCELLANEOUS) ×4 IMPLANT
PENCIL BUTTON HOLSTER BLD 10FT (ELECTRODE) ×4 IMPLANT
POSITIONER HEAD DONUT 9IN (MISCELLANEOUS) ×4 IMPLANT
PUNCH AORTIC ROTATE 4.0MM (MISCELLANEOUS) IMPLANT
PUNCH AORTIC ROTATE 4.5MM 8IN (MISCELLANEOUS) ×1 IMPLANT
PUNCH AORTIC ROTATE 5MM 8IN (MISCELLANEOUS) IMPLANT
SET CARDIOPLEGIA MPS 5001102 (MISCELLANEOUS) ×1 IMPLANT
SPONGE LAP 18X18 X RAY DECT (DISPOSABLE) ×1 IMPLANT
STRIP CLOSURE SKIN 1/2X4 (GAUZE/BANDAGES/DRESSINGS) ×1 IMPLANT
SUPPORT HEART JANKE-BARRON (MISCELLANEOUS) ×4 IMPLANT
SUT BONE WAX W31G (SUTURE) ×4 IMPLANT
SUT MNCRL AB 4-0 PS2 18 (SUTURE) ×1 IMPLANT
SUT PROLENE 3 0 SH DA (SUTURE) ×4 IMPLANT
SUT PROLENE 4 0 RB 1 (SUTURE)
SUT PROLENE 4 0 SH DA (SUTURE) IMPLANT
SUT PROLENE 4-0 RB1 .5 CRCL 36 (SUTURE) IMPLANT
SUT PROLENE 6 0 C 1 30 (SUTURE) ×14 IMPLANT
SUT PROLENE 7 0 BV1 MDA (SUTURE) ×8 IMPLANT
SUT PROLENE 8 0 BV175 6 (SUTURE) IMPLANT
SUT STEEL 6MS V (SUTURE) ×4 IMPLANT
SUT STEEL STERNAL CCS#1 18IN (SUTURE) IMPLANT
SUT STEEL SZ 6 DBL 3X14 BALL (SUTURE) ×4 IMPLANT
SUT VIC AB 1 CTX 36 (SUTURE) ×2
SUT VIC AB 1 CTX36XBRD ANBCTR (SUTURE) ×6 IMPLANT
SUT VIC AB 2-0 CT1 27 (SUTURE)
SUT VIC AB 2-0 CT1 TAPERPNT 27 (SUTURE) IMPLANT
SUT VIC AB 2-0 CTX 27 (SUTURE) IMPLANT
SUT VIC AB 3-0 SH 27 (SUTURE)
SUT VIC AB 3-0 SH 27X BRD (SUTURE) IMPLANT
SUT VIC AB 3-0 X1 27 (SUTURE) IMPLANT
SUT VIC AB 4-0 PS2 18 (SUTURE) ×1 IMPLANT
SUT VICRYL 4-0 PS2 18IN ABS (SUTURE) IMPLANT
SYSTEM SAHARA CHEST DRAIN ATS (WOUND CARE) ×4 IMPLANT
TAPE CLOTH SURG 4X10 WHT LF (GAUZE/BANDAGES/DRESSINGS) ×1 IMPLANT
TOWEL GREEN STERILE (TOWEL DISPOSABLE) ×4 IMPLANT
TOWEL GREEN STERILE FF (TOWEL DISPOSABLE) ×4 IMPLANT
TRAY FOLEY SLVR 16FR TEMP STAT (SET/KITS/TRAYS/PACK) ×4 IMPLANT
TUBE SUCT INTRACARD DLP 20F (MISCELLANEOUS) ×1 IMPLANT
TUBE SUCTION CARDIAC 10FR (CANNULA) ×1 IMPLANT
TUBING LAP HI FLOW INSUFFLATIO (TUBING) ×4 IMPLANT
UNDERPAD 30X36 HEAVY ABSORB (UNDERPADS AND DIAPERS) ×4 IMPLANT
WATER STERILE IRR 1000ML POUR (IV SOLUTION) ×8 IMPLANT

## 2020-01-26 NOTE — Op Note (Signed)
NAME: CODIE, FINIGAN MEDICAL RECORD [id number] ACCOUNT [id number] DATE OF BIRTH:[birth year] FACILITY: [facility] LOCATION: [facility] PHYSICIAN:Hosie Sharman Chaya Jan, MD  OPERATIVE REPORT  DATE OF PROCEDURE:  01/26/2020  PREOPERATIVE DIAGNOSES:  Three-vessel coronary artery disease.  POSTOPERATIVE DIAGNOSIS:  Three-vessel coronary artery disease.  PROCEDURE:   Median sternotomy, extracorporeal circulation Coronary artery bypass grafting x 4  Left internal mammary artery to left anterior descending,  Saphenous vein graft to first diagonal,  Saphenous vein graft to obtuse marginal 1,  Saphenous vein graft to posterior descending Endoscopic vein harvest right leg.  SURGEON:  Modesto Charon, MD  ASSISTANT:  Jadene Pierini, PA  ANESTHESIA:  General.  FINDINGS:  Vein fair quality. Mammary good quality. Diffusely diseased, fair quality target vessels. Preserved left ventricular function.  CLINICAL NOTE:  Mr. Jean is a 64 year old gentleman with multiple medical problems who recently has been found to have 3-vessel coronary disease with preserved left ventricular function.  He was referred for coronary artery bypass grafting.   Indications, risks, benefits, and alternatives were discussed in detail with the patient.  He understood and accepted the risks and agreed to proceed.  DESCRIPTION OF PROCEDURE:  Mr. Florer was brought to the preoperative holding area on 01/26/2020.  Anesthesia placed a Swan-Ganz catheter and an arterial blood pressure monitoring line.  He was taken to the operating room, anesthetized and intubated.  A  Foley catheter was placed.  Intravenous antibiotics were administered.  The chest, abdomen and legs were prepped and draped in the usual sterile fashion.  A timeout was performed.  An incision was made in the medial aspect of the right leg at the level of the knee. The greater saphenous vein was identified and was harvested endoscopically from midcalf to groin.  The vein  was of suitable caliber and it was  satisfactory but fair quality, being relatively thin walled.  Simultaneously with the vein harvest, a median sternotomy was performed and the left internal mammary artery was harvested using standard technique.  The mammary was a good quality vessel with  excellent flow when divided distally.  Two thousand units of heparin was administered during the vessel harvest.  The remainder of the full dose of heparin was given after harvesting the conduits.  The pericardium was opened.  After confirming adequate anticoagulation with ACT measurement, the aorta was cannulated via concentric 2-0 Ethibond pledgeted pursestring sutures.  A dual stage venous cannula was placed via a pursestring suture in the right atrial  appendage.  Cardiopulmonary bypass was initiated.  Flows were maintained per protocol.  The patient was cooled to 32 degrees Celsius.  The coronary arteries were inspected and anastomotic sites were chosen.  The conduits were inspected and cut to length.   A foam pad was placed in the pericardium to insulate the heart.  A temperature probe was placed in the myocardial septum and a cardioplegia cannula was placed in the ascending aorta.  The aorta was crossclamped.  The left ventricle was emptied via the aortic root vent.  Cardiac arrest then was achieved with a combination of cold antegrade blood cardioplegia and topical iced saline.  There was a rapid diastolic arrest with cardioplegia  administration and there was septal cooling to 9 degrees Celsius after 1 L had been given.  A reversed saphenous vein graft was placed end to side to the posterior descending branch of the right coronary.  This was a 1.5 mm fair quality target.  The vein was anastomosed end-to-side with a running 7-0 Prolene suture.  At  the completion of the  anastomosis, a probe passed easily proximally and distally.  Cardioplegia was administered down the graft to assess flow and hemostasis.   Additional cardioplegia was administered down the aortic root.  The heart was elevated, exposing the lateral wall.  OM2 was a large vessel.  It was intramyocardial.  It was grafted just proximal to a bifurcation.  The vessel was a 2 mm fair quality target due to diffuse plaque.  The vein was anastomosed end-to-side  with a running 7-0 Prolene suture.  Again, a probe passed easily proximally and distally.  Cardioplegia was administered down the graft and there was good flow and good hemostasis.  Next, a reversed saphenous vein graft was placed end-to-side to the first diagonal branch of the LAD.  This was a 1.5 mm fair quality target vessel.  There was diffuse disease, both proximal and distal to the anastomosis.  The vein graft was of smaller  caliber, but still acceptable.  It was anastomosed end-to-side with a running 7-0 Prolene suture.  At the completion of this anastomosis, additional cardioplegia was administered down the graft.  A probe did pass easily proximally and distally prior to  tying the suture.  There was good flow through the graft with cardioplegia administration.  Additional cardioplegia was also administered via the aortic root.  The left internal mammary artery was brought through a window in the pericardium.  The distal end was bevelled.  The mammary was anastomosed end-to-side to the distal LAD with a running 8-0 Prolene suture.  The distal LAD was 1.5 mm fair quality target  vessel.  It did have some diffuse disease.  At the completion of anastomosis, the bulldog clamp was removed.  Rapid septal rewarming was noted.  The bulldog clamps was replaced and the mammary pedicle was tacked to the epicardial surface heart with 6-0  Prolene sutures.  Additional cardioplegia was administered.  The cardioplegia cannula then was removed from the ascending aorta.  The vein grafts were cut to length.  The proximal vein graft anastomoses were performed to 4.5 mm punch aortotomies with running  6-0 Prolene  sutures.  After completion of the final proximal anastomosis, the patient was placed in Trendelenburg position.  Lidocaine was administered.  The aortic root was de-aired and the aortic crossclamp was removed.  The total crossclamp time was 86 minutes.   The patient required a single defibrillation with 10 joules and then was in sinus rhythm thereafter.  While rewarming was completed, all proximal and distal anastomoses were inspected for hemostasis.  Repair sutures were placed as needed at the proximal  to the diagonal graft.  Epicardial pacing wires were placed on the right ventricle and right atrium.  When the patient had rewarmed to a core temperature of 37 degrees Celsius, he was weaned from cardiopulmonary bypass on the first attempt without  inotropic support.  Total bypass time was 144 minutes.  The initial cardiac index was greater than 2 L per minute per meter squared and the patient remained hemodynamically stable throughout the post-bypass period.  A test dose of protamine was administered and was well tolerated.  The atrial and aortic cannulae were removed.  The remainder of the protamine was administered without incident.  The chest was irrigated with warm saline.  Hemostasis was achieved.  The  pericardium was reapproximated with interrupted 3-0 silk sutures and came together easily without tension.  The sternum was closed with a combination of single and double heavy gauge stainless steel wires.  The pectoralis fascia, subcutaneous tissue and  skin were closed in standard fashion.  All sponge, needle and instrument counts were correct at the end of the procedure.  The patient was taken from the operating room to the Surgical Intensive Care Unit, intubated and in good condition.  VN/NUANCE  D:01/26/2020 T:01/26/2020 JOB:[id number]

## 2020-01-26 NOTE — Brief Op Note (Signed)
01/26/2020  3:07 PM  PATIENT:  Mark Foster  64 y.o. male  PRE-OPERATIVE DIAGNOSIS:  CORONARY ARTERY DISEASE  POST-OPERATIVE DIAGNOSIS:  CORONARY ARTERY DISEASE  PROCEDURE:  Procedure(s): CORONARY ARTERY BYPASS GRAFTING (CABG) TIMES FOUR USING LEFT INTERNAL MAMMARY VEIN AND RIGHT GREATER SAPHENOUS VEIN (N/A) TRANSESOPHAGEAL ECHOCARDIOGRAM (TEE) (N/A) Endovein Harvest Of Greater Saphenous Vein (Right) LIMA-LAD SVG-OM SVG-PD SVG-DIAG  SURGEON:  Surgeon(s) and Role:    * Melrose Nakayama, MD - Primary  PHYSICIAN ASSISTANT: WAYNE GOLD PA-C  ASSISTANTS: STAFF   ANESTHESIA:   general  EBL:  962 mL   BLOOD ADMINISTERED:none  DRAINS: LEFT PLEURAL AND MEDIASTINAL CHEST TUBES   LOCAL MEDICATIONS USED:  NONE  SPECIMEN:  No Specimen  DISPOSITION OF SPECIMEN:  N/A  COUNTS:  YES  TOURNIQUET:  * No tourniquets in log *  DICTATION: .Other Dictation: Dictation Number [id number]  PLAN OF CARE: Admit to inpatient   PATIENT DISPOSITION:  ICU - intubated and hemodynamically stable.   Delay start of Pharmacological VTE agent (>24hrs) due to surgical blood loss or risk of bleeding: yes  COMPLICATIONS: NO KNOWN

## 2020-01-26 NOTE — Progress Notes (Signed)
  Echocardiogram Echocardiogram Transesophageal has been performed.  Mark Foster 01/26/2020, 8:44 AM

## 2020-01-26 NOTE — Progress Notes (Signed)
1ST CALL TO ICU 1230 2ND CALL TO ICU 1303 LAST CALL TO ICU 1323

## 2020-01-26 NOTE — Anesthesia Procedure Notes (Signed)
Arterial Line Insertion Start/End5/24/2021 6:50 AM, 01/26/2020 7:00 AM Performed by: Duane Boston, MD, Griffin Dakin, CRNA, CRNA  Patient location: Pre-op. Preanesthetic checklist: patient identified, IV checked, site marked, risks and benefits discussed, surgical consent, monitors and equipment checked, pre-op evaluation, timeout performed and anesthesia consent Lidocaine 1% used for infiltration Left, radial was placed Catheter size: 20 Fr Hand hygiene performed  and maximum sterile barriers used   Attempts: 1 Procedure performed without using ultrasound guided technique. Following insertion, dressing applied and Biopatch. Post procedure assessment: normal and unchanged  Patient tolerated the procedure well with no immediate complications.

## 2020-01-26 NOTE — Anesthesia Procedure Notes (Signed)
Procedure Name: Intubation Date/Time: 01/26/2020 7:46 AM Performed by: Griffin Dakin, CRNA Pre-anesthesia Checklist: Patient identified, Emergency Drugs available, Suction available and Patient being monitored Patient Re-evaluated:Patient Re-evaluated prior to induction Oxygen Delivery Method: Circle system utilized Preoxygenation: Pre-oxygenation with 100% oxygen Induction Type: IV induction Ventilation: Mask ventilation without difficulty and Oral airway inserted - appropriate to patient size Laryngoscope Size: Mac and 4 Grade View: Grade I Tube type: Oral Tube size: 8.0 mm Number of attempts: 1 Airway Equipment and Method: Stylet and Oral airway Placement Confirmation: ETT inserted through vocal cords under direct vision,  positive ETCO2 and breath sounds checked- equal and bilateral Secured at: 23 cm Tube secured with: Tape Dental Injury: Teeth and Oropharynx as per pre-operative assessment

## 2020-01-26 NOTE — Interval H&P Note (Signed)
History and Physical Interval Note:  01/26/2020 7:18 AM  Mark Foster  has presented today for surgery, with the diagnosis of CAD.  The various methods of treatment have been discussed with the patient and family. After consideration of risks, benefits and other options for treatment, the patient has consented to  Procedure(s): CORONARY ARTERY BYPASS GRAFTING (CABG) (N/A) TRANSESOPHAGEAL ECHOCARDIOGRAM (TEE) (N/A) as a surgical intervention.  The patient's history has been reviewed, patient examined, no change in status, stable for surgery.  I have reviewed the patient's chart and labs.  Questions were answered to the patient's satisfaction.     Melrose Nakayama

## 2020-01-26 NOTE — Anesthesia Postprocedure Evaluation (Signed)
Anesthesia Post Note  Patient: Levan Sooy  Procedure(s) Performed: CORONARY ARTERY BYPASS GRAFTING (CABG) TIMES FOUR USING LEFT INTERNAL MAMMARY VEIN AND RIGHT GREATER SAPHENOUS VEIN (N/A Chest) TRANSESOPHAGEAL ECHOCARDIOGRAM (TEE) (N/A ) Endovein Harvest Of Greater Saphenous Vein (Right Leg Lower)     Patient location during evaluation: SICU Anesthesia Type: General Level of consciousness: sedated Pain management: pain level controlled Vital Signs Assessment: post-procedure vital signs reviewed and stable Respiratory status: patient remains intubated per anesthesia plan Cardiovascular status: stable Postop Assessment: no apparent nausea or vomiting Anesthetic complications: no    Last Vitals:  Vitals:   01/26/20 0619 01/26/20 1332  BP: 120/63   Pulse: 74   Resp: 18 12  Temp: (!) 36.3 C   SpO2: 98%     Last Pain:  Vitals:   01/26/20 0619  TempSrc: Tympanic                 Azarel Banner DANIEL

## 2020-01-26 NOTE — Transfer of Care (Signed)
Immediate Anesthesia Transfer of Care Note  Patient: Mark Foster  Procedure(s) Performed: CORONARY ARTERY BYPASS GRAFTING (CABG) TIMES FOUR USING LEFT INTERNAL MAMMARY VEIN AND RIGHT GREATER SAPHENOUS VEIN (N/A Chest) TRANSESOPHAGEAL ECHOCARDIOGRAM (TEE) (N/A ) Endovein Harvest Of Greater Saphenous Vein (Right Leg Lower)  Patient Location: ICU  Anesthesia Type:General  Level of Consciousness: Patient remains intubated per anesthesia plan  Airway & Oxygen Therapy: Patient remains intubated per anesthesia plan and Patient placed on Ventilator (see vital sign flow sheet for setting)  Post-op Assessment: Report given to RN and Post -op Vital signs reviewed and stable  Post vital signs: Reviewed and stable  Last Vitals:  Vitals Value Taken Time  BP 97/73 01/26/20 1336  Temp 36.4 C 01/26/20 1341  Pulse 87 01/26/20 1341  Resp 14 01/26/20 1341  SpO2 100 % 01/26/20 1341  Vitals shown include unvalidated device data.  Last Pain:  Vitals:   01/26/20 0619  TempSrc: Tympanic         Complications: No apparent anesthesia complications

## 2020-01-26 NOTE — Progress Notes (Signed)
Patient ID: Mark Foster, male   DOB: [birth year], 64 y.o.   MRN: [id number]  TCTS Evening Rounds:   Hemodynamically stable  CI = 2.2  Has started to wake up on vent. Weaning  Urine output good  CT output low  CBC    Component Value Date/Time   WBC 7.8 01/26/2020 1342   RBC 3.83 (L) 01/26/2020 1342   HGB 12.3 (L) 01/26/2020 1342   HGB 14.9 12/11/2018 1018   HCT 35.7 (L) 01/26/2020 1342   PLT 79 (L) 01/26/2020 1342   PLT 335 12/11/2018 1018   MCV 93.2 01/26/2020 1342   MCH 32.1 01/26/2020 1342   MCHC 34.5 01/26/2020 1342   RDW 14.0 01/26/2020 1342   LYMPHSABS 2.0 12/18/2019 1207   MONOABS 0.7 12/18/2019 1207   EOSABS 0.1 12/18/2019 1207   BASOSABS 0.1 12/18/2019 1207     BMET    Component Value Date/Time   NA 137 01/22/2020 1230   K 4.6 01/22/2020 1230   CL 97 (L) 01/22/2020 1230   CO2 29 01/22/2020 1230   GLUCOSE 400 (H) 01/22/2020 1230   BUN 17 01/22/2020 1230   CREATININE 0.87 01/22/2020 1230   CREATININE 0.81 09/30/2019 1113   CALCIUM 9.5 01/22/2020 1230   GFRNONAA >60 01/22/2020 1230   GFRNONAA 94 09/30/2019 1113   GFRAA >60 01/22/2020 1230   GFRAA 109 09/30/2019 1113     A/P:  Stable postop course. Continue current plans

## 2020-01-26 NOTE — Procedures (Signed)
Extubation Procedure Note  Patient Details:   Name: Mark Foster DOB: [birth year] MRN: [id number]   Airway Documentation:  Airway 8 mm (Active)  Secured at (cm) 23 cm 01/26/20 2000  Measured From Lips 01/26/20 2000  Secured Location Right 01/26/20 1958  Secured By Pink Tape 01/26/20 2000  Site Condition Cool;Dry 01/26/20 1958   Vent end date: (not recorded) Vent end time: (not recorded)   Evaluation  O2 sats: stable throughout Complications: No apparent complications Patient did tolerate procedure well. Bilateral Breath Sounds: Clear, Diminished, Pleural rub   Yes  PT extubated per rappid wean protocol. PT met all required parameters( abg, NIF, and VC)   Levora Dredge 01/26/2020, 8:54 PM

## 2020-01-27 ENCOUNTER — Ambulatory Visit (INDEPENDENT_AMBULATORY_CARE_PROVIDER_SITE_OTHER): Payer: Medicare Other | Admitting: Nurse Practitioner

## 2020-01-27 ENCOUNTER — Encounter: Payer: Self-pay | Admitting: *Deleted

## 2020-01-27 ENCOUNTER — Inpatient Hospital Stay (HOSPITAL_COMMUNITY): Payer: Medicare HMO

## 2020-01-27 LAB — POCT I-STAT 7, (LYTES, BLD GAS, ICA,H+H)
Acid-Base Excess: 3 mmol/L — ABNORMAL HIGH (ref 0.0–2.0)
Acid-Base Excess: 2 mmol/L (ref 0.0–2.0)
Acid-Base Excess: 2 mmol/L (ref 0.0–2.0)
Acid-Base Excess: 2 mmol/L (ref 0.0–2.0)
Bicarbonate: 28.9 mmol/L — ABNORMAL HIGH (ref 20.0–28.0)
Bicarbonate: 28.3 mmol/L — ABNORMAL HIGH (ref 20.0–28.0)
Bicarbonate: 27 mmol/L (ref 20.0–28.0)
Bicarbonate: 27.2 mmol/L (ref 20.0–28.0)
Calcium, Ion: 1.23 mmol/L (ref 1.15–1.40)
Calcium, Ion: 1.1 mmol/L — ABNORMAL LOW (ref 1.15–1.40)
Calcium, Ion: 1.15 mmol/L (ref 1.15–1.40)
Calcium, Ion: 1.14 mmol/L — ABNORMAL LOW (ref 1.15–1.40)
HCT: 39 % (ref 39.0–52.0)
HCT: 32 % — ABNORMAL LOW (ref 39.0–52.0)
HCT: 29 % — ABNORMAL LOW (ref 39.0–52.0)
HCT: 28 % — ABNORMAL LOW (ref 39.0–52.0)
Hemoglobin: 13.3 g/dL (ref 13.0–17.0)
Hemoglobin: 10.9 g/dL — ABNORMAL LOW (ref 13.0–17.0)
Hemoglobin: 9.9 g/dL — ABNORMAL LOW (ref 13.0–17.0)
Hemoglobin: 9.5 g/dL — ABNORMAL LOW (ref 13.0–17.0)
O2 Saturation: 100 %
O2 Saturation: 100 %
O2 Saturation: 100 %
O2 Saturation: 100 %
Potassium: 3.8 mmol/L (ref 3.5–5.1)
Potassium: 3.8 mmol/L (ref 3.5–5.1)
Potassium: 4.3 mmol/L (ref 3.5–5.1)
Potassium: 3.6 mmol/L (ref 3.5–5.1)
Sodium: 137 mmol/L (ref 135–145)
Sodium: 141 mmol/L (ref 135–145)
Sodium: 140 mmol/L (ref 135–145)
Sodium: 141 mmol/L (ref 135–145)
TCO2: 30 mmol/L (ref 22–32)
TCO2: 30 mmol/L (ref 22–32)
TCO2: 28 mmol/L (ref 22–32)
TCO2: 29 mmol/L (ref 22–32)
pCO2 arterial: 50.3 mmHg — ABNORMAL HIGH (ref 32.0–48.0)
pCO2 arterial: 54 mmHg — ABNORMAL HIGH (ref 32.0–48.0)
pCO2 arterial: 42.2 mmHg (ref 32.0–48.0)
pCO2 arterial: 46.1 mmHg (ref 32.0–48.0)
pH, Arterial: 7.367 (ref 7.350–7.450)
pH, Arterial: 7.327 — ABNORMAL LOW (ref 7.350–7.450)
pH, Arterial: 7.415 (ref 7.350–7.450)
pH, Arterial: 7.378 (ref 7.350–7.450)
pO2, Arterial: 618 mmHg — ABNORMAL HIGH (ref 83.0–108.0)
pO2, Arterial: 337 mmHg — ABNORMAL HIGH (ref 83.0–108.0)
pO2, Arterial: 298 mmHg — ABNORMAL HIGH (ref 83.0–108.0)
pO2, Arterial: 407 mmHg — ABNORMAL HIGH (ref 83.0–108.0)

## 2020-01-27 LAB — CBC
HCT: 36 % — ABNORMAL LOW (ref 39.0–52.0)
HCT: 35.2 % — ABNORMAL LOW (ref 39.0–52.0)
Hemoglobin: 12.4 g/dL — ABNORMAL LOW (ref 13.0–17.0)
Hemoglobin: 12 g/dL — ABNORMAL LOW (ref 13.0–17.0)
MCH: 32.3 pg (ref 26.0–34.0)
MCH: 32.4 pg (ref 26.0–34.0)
MCHC: 34.4 g/dL (ref 30.0–36.0)
MCHC: 34.1 g/dL (ref 30.0–36.0)
MCV: 93.8 fL (ref 80.0–100.0)
MCV: 95.1 fL (ref 80.0–100.0)
Platelets: 101 10*3/uL — ABNORMAL LOW (ref 150–400)
Platelets: 103 10*3/uL — ABNORMAL LOW (ref 150–400)
RBC: 3.84 MIL/uL — ABNORMAL LOW (ref 4.22–5.81)
RBC: 3.7 MIL/uL — ABNORMAL LOW (ref 4.22–5.81)
RDW: 14.4 % (ref 11.5–15.5)
RDW: 14.7 % (ref 11.5–15.5)
WBC: 8.6 10*3/uL (ref 4.0–10.5)
WBC: 8.8 10*3/uL (ref 4.0–10.5)
nRBC: 0 % (ref 0.0–0.2)
nRBC: 0 % (ref 0.0–0.2)

## 2020-01-27 LAB — GLUCOSE, CAPILLARY
Glucose-Capillary: 127 mg/dL — ABNORMAL HIGH (ref 70–99)
Glucose-Capillary: 138 mg/dL — ABNORMAL HIGH (ref 70–99)
Glucose-Capillary: 139 mg/dL — ABNORMAL HIGH (ref 70–99)
Glucose-Capillary: 140 mg/dL — ABNORMAL HIGH (ref 70–99)
Glucose-Capillary: 142 mg/dL — ABNORMAL HIGH (ref 70–99)
Glucose-Capillary: 143 mg/dL — ABNORMAL HIGH (ref 70–99)
Glucose-Capillary: 155 mg/dL — ABNORMAL HIGH (ref 70–99)
Glucose-Capillary: 178 mg/dL — ABNORMAL HIGH (ref 70–99)
Glucose-Capillary: 194 mg/dL — ABNORMAL HIGH (ref 70–99)
Glucose-Capillary: 203 mg/dL — ABNORMAL HIGH (ref 70–99)
Glucose-Capillary: 209 mg/dL — ABNORMAL HIGH (ref 70–99)
Glucose-Capillary: 221 mg/dL — ABNORMAL HIGH (ref 70–99)
Glucose-Capillary: 261 mg/dL — ABNORMAL HIGH (ref 70–99)

## 2020-01-27 LAB — POCT I-STAT, CHEM 8
BUN: 22 mg/dL (ref 8–23)
BUN: 20 mg/dL (ref 8–23)
BUN: 18 mg/dL (ref 8–23)
BUN: 17 mg/dL (ref 8–23)
BUN: 17 mg/dL (ref 8–23)
BUN: 16 mg/dL (ref 8–23)
BUN: 16 mg/dL (ref 8–23)
Calcium, Ion: 1.25 mmol/L (ref 1.15–1.40)
Calcium, Ion: 1.27 mmol/L (ref 1.15–1.40)
Calcium, Ion: 1.09 mmol/L — ABNORMAL LOW (ref 1.15–1.40)
Calcium, Ion: 1.14 mmol/L — ABNORMAL LOW (ref 1.15–1.40)
Calcium, Ion: 1.18 mmol/L (ref 1.15–1.40)
Calcium, Ion: 1.11 mmol/L — ABNORMAL LOW (ref 1.15–1.40)
Calcium, Ion: 1.1 mmol/L — ABNORMAL LOW (ref 1.15–1.40)
Chloride: 100 mmol/L (ref 98–111)
Chloride: 102 mmol/L (ref 98–111)
Chloride: 100 mmol/L (ref 98–111)
Chloride: 103 mmol/L (ref 98–111)
Chloride: 104 mmol/L (ref 98–111)
Chloride: 102 mmol/L (ref 98–111)
Chloride: 103 mmol/L (ref 98–111)
Creatinine, Ser: 0.5 mg/dL — ABNORMAL LOW (ref 0.61–1.24)
Creatinine, Ser: 0.6 mg/dL — ABNORMAL LOW (ref 0.61–1.24)
Creatinine, Ser: 0.5 mg/dL — ABNORMAL LOW (ref 0.61–1.24)
Creatinine, Ser: 0.5 mg/dL — ABNORMAL LOW (ref 0.61–1.24)
Creatinine, Ser: 0.5 mg/dL — ABNORMAL LOW (ref 0.61–1.24)
Creatinine, Ser: 0.4 mg/dL — ABNORMAL LOW (ref 0.61–1.24)
Creatinine, Ser: 0.5 mg/dL — ABNORMAL LOW (ref 0.61–1.24)
Glucose, Bld: 271 mg/dL — ABNORMAL HIGH (ref 70–99)
Glucose, Bld: 208 mg/dL — ABNORMAL HIGH (ref 70–99)
Glucose, Bld: 153 mg/dL — ABNORMAL HIGH (ref 70–99)
Glucose, Bld: 141 mg/dL — ABNORMAL HIGH (ref 70–99)
Glucose, Bld: 141 mg/dL — ABNORMAL HIGH (ref 70–99)
Glucose, Bld: 132 mg/dL — ABNORMAL HIGH (ref 70–99)
Glucose, Bld: 128 mg/dL — ABNORMAL HIGH (ref 70–99)
HCT: 39 % (ref 39.0–52.0)
HCT: 35 % — ABNORMAL LOW (ref 39.0–52.0)
HCT: 32 % — ABNORMAL LOW (ref 39.0–52.0)
HCT: 30 % — ABNORMAL LOW (ref 39.0–52.0)
HCT: 31 % — ABNORMAL LOW (ref 39.0–52.0)
HCT: 28 % — ABNORMAL LOW (ref 39.0–52.0)
HCT: 31 % — ABNORMAL LOW (ref 39.0–52.0)
Hemoglobin: 13.3 g/dL (ref 13.0–17.0)
Hemoglobin: 11.9 g/dL — ABNORMAL LOW (ref 13.0–17.0)
Hemoglobin: 10.9 g/dL — ABNORMAL LOW (ref 13.0–17.0)
Hemoglobin: 10.2 g/dL — ABNORMAL LOW (ref 13.0–17.0)
Hemoglobin: 10.5 g/dL — ABNORMAL LOW (ref 13.0–17.0)
Hemoglobin: 9.5 g/dL — ABNORMAL LOW (ref 13.0–17.0)
Hemoglobin: 10.5 g/dL — ABNORMAL LOW (ref 13.0–17.0)
Potassium: 3.8 mmol/L (ref 3.5–5.1)
Potassium: 3.5 mmol/L (ref 3.5–5.1)
Potassium: 3.7 mmol/L (ref 3.5–5.1)
Potassium: 3.8 mmol/L (ref 3.5–5.1)
Potassium: 4.3 mmol/L (ref 3.5–5.1)
Potassium: 3.9 mmol/L (ref 3.5–5.1)
Potassium: 3.7 mmol/L (ref 3.5–5.1)
Sodium: 136 mmol/L (ref 135–145)
Sodium: 137 mmol/L (ref 135–145)
Sodium: 139 mmol/L (ref 135–145)
Sodium: 138 mmol/L (ref 135–145)
Sodium: 140 mmol/L (ref 135–145)
Sodium: 138 mmol/L (ref 135–145)
Sodium: 141 mmol/L (ref 135–145)
TCO2: 31 mmol/L (ref 22–32)
TCO2: 30 mmol/L (ref 22–32)
TCO2: 31 mmol/L (ref 22–32)
TCO2: 28 mmol/L (ref 22–32)
TCO2: 29 mmol/L (ref 22–32)
TCO2: 30 mmol/L (ref 22–32)
TCO2: 27 mmol/L (ref 22–32)

## 2020-01-27 LAB — BASIC METABOLIC PANEL
Anion gap: 7 (ref 5–15)
Anion gap: 6 (ref 5–15)
BUN: 14 mg/dL (ref 8–23)
BUN: 25 mg/dL — ABNORMAL HIGH (ref 8–23)
CO2: 22 mmol/L (ref 22–32)
CO2: 24 mmol/L (ref 22–32)
Calcium: 7.9 mg/dL — ABNORMAL LOW (ref 8.9–10.3)
Calcium: 8 mg/dL — ABNORMAL LOW (ref 8.9–10.3)
Chloride: 108 mmol/L (ref 98–111)
Chloride: 104 mmol/L (ref 98–111)
Creatinine, Ser: 0.65 mg/dL (ref 0.61–1.24)
Creatinine, Ser: 0.99 mg/dL (ref 0.61–1.24)
GFR calc Af Amer: >60 mL/min (ref >60)
GFR calc Af Amer: >60 mL/min (ref >60)
GFR calc non Af Amer: >60 mL/min (ref >60)
GFR calc non Af Amer: >60 mL/min (ref >60)
Glucose, Bld: 151 mg/dL — ABNORMAL HIGH (ref 70–99)
Glucose, Bld: 257 mg/dL — ABNORMAL HIGH (ref 70–99)
Potassium: 4.3 mmol/L (ref 3.5–5.1)
Potassium: 4.5 mmol/L (ref 3.5–5.1)
Sodium: 137 mmol/L (ref 135–145)
Sodium: 134 mmol/L — ABNORMAL LOW (ref 135–145)

## 2020-01-27 LAB — MAGNESIUM
Magnesium: 2.3 mg/dL (ref 1.7–2.4)
Magnesium: 2.2 mg/dL (ref 1.7–2.4)

## 2020-01-27 MED ORDER — INSULIN ASPART 100 UNIT/ML ~~LOC~~ SOLN
0.0000 [IU] | Freq: Three times a day (TID) | SUBCUTANEOUS | Status: DC
  Administered 2020-01-27: 12 [IU] via SUBCUTANEOUS
  Administered 2020-01-28: 8 [IU] via SUBCUTANEOUS
  Administered 2020-01-28: 4 [IU] via SUBCUTANEOUS
  Administered 2020-01-28: 8 [IU] via SUBCUTANEOUS
  Administered 2020-01-28: 12 [IU] via SUBCUTANEOUS
  Administered 2020-01-29: 2 [IU] via SUBCUTANEOUS
  Administered 2020-01-29: 6 [IU] via SUBCUTANEOUS
  Administered 2020-01-29: 4 [IU] via SUBCUTANEOUS
  Administered 2020-01-30 (×2): 12 [IU] via SUBCUTANEOUS

## 2020-01-27 MED ORDER — ENOXAPARIN SODIUM 40 MG/0.4ML ~~LOC~~ SOLN
40.0000 mg | Freq: Every day | SUBCUTANEOUS | Status: DC
  Administered 2020-01-27 – 2020-01-30 (×4): 40 mg via SUBCUTANEOUS
  Filled 2020-01-27 (×4): qty 0.4

## 2020-01-27 MED ORDER — INSULIN ASPART 100 UNIT/ML ~~LOC~~ SOLN
3.0000 [IU] | Freq: Three times a day (TID) | SUBCUTANEOUS | Status: DC
  Administered 2020-01-27 – 2020-01-28 (×3): 3 [IU] via SUBCUTANEOUS

## 2020-01-27 MED ORDER — KETOROLAC TROMETHAMINE 15 MG/ML IJ SOLN
15.0000 mg | Freq: Four times a day (QID) | INTRAMUSCULAR | Status: AC
  Administered 2020-01-27 – 2020-01-28 (×8): 15 mg via INTRAVENOUS
  Filled 2020-01-27 (×8): qty 1

## 2020-01-27 MED ORDER — ALBUMIN HUMAN 5 % IV SOLN
12.5000 g | Freq: Once | INTRAVENOUS | Status: AC
  Administered 2020-01-27: 12.5 g via INTRAVENOUS
  Filled 2020-01-27: qty 250

## 2020-01-27 MED ORDER — MECLIZINE HCL 25 MG PO TABS
25.0000 mg | ORAL_TABLET | Freq: Three times a day (TID) | ORAL | Status: DC | PRN
  Filled 2020-01-27 (×2): qty 1

## 2020-01-27 MED ORDER — INSULIN DETEMIR 100 UNIT/ML ~~LOC~~ SOLN
15.0000 [IU] | Freq: Two times a day (BID) | SUBCUTANEOUS | Status: DC
  Administered 2020-01-27 (×2): 15 [IU] via SUBCUTANEOUS
  Filled 2020-01-27 (×4): qty 0.15

## 2020-01-27 MED ORDER — INSULIN ASPART 100 UNIT/ML ~~LOC~~ SOLN
0.0000 [IU] | SUBCUTANEOUS | Status: DC
  Administered 2020-01-27: 4 [IU] via SUBCUTANEOUS
  Administered 2020-01-27: 8 [IU] via SUBCUTANEOUS

## 2020-01-27 MED FILL — Magnesium Sulfate Inj 50%: INTRAMUSCULAR | Qty: 10 | Status: AC

## 2020-01-27 MED FILL — Potassium Chloride Inj 2 mEq/ML: INTRAVENOUS | Qty: 40 | Status: AC

## 2020-01-27 MED FILL — Heparin Sodium (Porcine) Inj 1000 Unit/ML: INTRAMUSCULAR | Qty: 30 | Status: AC

## 2020-01-27 NOTE — Addendum Note (Signed)
Addendum  created 01/27/20 0809 by Josephine Igo, CRNA   Order list changed

## 2020-01-27 NOTE — Plan of Care (Signed)
  Problem: Clinical Measurements: Goal: Ability to maintain clinical measurements within normal limits will improve Outcome: Progressing Goal: Respiratory complications will improve Outcome: Progressing   Problem: Activity: Goal: Risk for activity intolerance will decrease Outcome: Progressing   Problem: Nutrition: Goal: Adequate nutrition will be maintained Outcome: Progressing   Problem: Pain Managment: Goal: General experience of comfort will improve Outcome: Progressing   

## 2020-01-27 NOTE — Progress Notes (Signed)
TCTS BRIEF SICU PROGRESS NOTE  1 Day Post-Op  S/P Procedure(s) (LRB): CORONARY ARTERY BYPASS GRAFTING (CABG) TIMES FOUR USING LEFT INTERNAL MAMMARY VEIN AND RIGHT GREATER SAPHENOUS VEIN (N/A) TRANSESOPHAGEAL ECHOCARDIOGRAM (TEE) (N/A) Endovein Harvest Of Greater Saphenous Vein (Right)   Stable day NSR w/ stable BP 123456 systolic - still on Neo Breathing comfortably on room air UOP adequate  Plan: Continue current plan  Rexene Alberts, MD 01/27/2020 6:34 PM

## 2020-01-27 NOTE — Progress Notes (Signed)
[facility] DAILY ICU PROGRESS NOTE                   [address]            [place] [id number]          [phone]   1 Day Post-Op Procedure(s) (LRB): CORONARY ARTERY BYPASS GRAFTING (CABG) TIMES FOUR USING LEFT INTERNAL MAMMARY VEIN AND RIGHT GREATER SAPHENOUS VEIN (N/A) TRANSESOPHAGEAL ECHOCARDIOGRAM (TEE) (N/A) Endovein Harvest Of Greater Saphenous Vein (Right)  Total Length of Stay:  LOS: 1 day   Subjective: C/o pain  Objective: Vital signs in last 24 hours: Temp:  [97.2 F (36.2 C)-100.8 F (38.2 C)] 99.3 F (37.4 C) (05/25 0700) Pulse Rate:  [78-96] 90 (05/25 0700) Cardiac Rhythm: Atrial paced (05/25 0400) Resp:  [7-26] 18 (05/25 0700) BP: (91-117)/(53-82) 103/77 (05/25 0700) SpO2:  [96 %-100 %] 97 % (05/25 0700) Arterial Line BP: (94-132)/(46-71) 132/59 (05/25 0700) FiO2 (%):  [40 %-50 %] 40 % (05/24 2023) Weight:  [78 kg] 78 kg (05/25 0500)  Filed Weights   01/26/20 0619 01/27/20 0500  Weight: 73.9 kg 78 kg    Weight change: 4.064 kg   Hemodynamic parameters for last 24 hours: PAP: (16-33)/(6-20) 26/13 CO:  [3.4 L/min-5.4 L/min] 4.4 L/min CI:  [1.8 L/min/m2-2.8 L/min/m2] 2.3 L/min/m2  Intake/Output from previous day: 05/24 0701 - 05/25 0700 In: 8102.1 [I.V.:5895.1; Blood:625; IV Piggyback:1582] Out: [id number] [Urine:3200; Blood:962; Chest Tube:596]  Intake/Output this shift: No intake/output data recorded.  Current Meds: Scheduled Meds: . acetaminophen  1,000 mg Oral Q6H   Or  . acetaminophen (TYLENOL) oral liquid 160 mg/5 mL  1,000 mg Per Tube Q6H  . aspirin EC  325 mg Oral Daily   Or  . aspirin  324 mg Per Tube Daily  . atorvastatin  80 mg Oral q1800  . bisacodyl  10 mg Oral Daily   Or  . bisacodyl  10 mg Rectal Daily  . Chlorhexidine Gluconate Cloth  6 each Topical Daily  . docusate sodium  200 mg Oral Daily  . ezetimibe  10 mg Oral Daily  . gabapentin  300 mg Oral BID  . levothyroxine  50 mcg Oral Daily  . mouth rinse  15 mL  Mouth Rinse BID  . metoprolol tartrate  12.5 mg Oral BID   Or  . metoprolol tartrate  12.5 mg Per Tube BID  . [START ON 01/28/2020] pantoprazole  40 mg Oral Daily  . sertraline  50 mg Oral Daily  . sodium chloride flush  10-40 mL Intracatheter Q12H  . sodium chloride flush  3 mL Intravenous Q12H  . tamsulosin  0.4 mg Oral Daily   Continuous Infusions: . sodium chloride Stopped (01/26/20 2343)  . sodium chloride    . sodium chloride 10 mL/hr at 01/26/20 1330  . cefUROXime (ZINACEF)  IV Stopped (01/26/20 2243)  . dexmedetomidine (PRECEDEX) IV infusion Stopped (01/26/20 1557)  . famotidine (PEPCID) IV Stopped (01/26/20 1408)  . insulin 0.4 mL/hr at 01/27/20 0700  . lactated ringers    . lactated ringers 20 mL/hr at 01/27/20 0700  . nitroGLYCERIN Stopped (01/26/20 1336)  . phenylephrine (NEO-SYNEPHRINE) Adult infusion Stopped (01/27/20 0655)   PRN Meds:.sodium chloride, dextrose, metoprolol tartrate, midazolam, morphine injection, ondansetron (ZOFRAN) IV, oxyCODONE, sodium chloride flush, sodium chloride flush, traMADol  General appearance: alert and cooperative Neurologic: intact Heart: regular rate and rhythm Lungs: diminished breath sounds bibasilar Abdomen: normal findings: soft, non-tender tiny air leak from CT  Lab Results: CBC: Recent Labs    01/26/20 1901 01/26/20 2044 01/26/20 2151 01/27/20 0319  WBC 6.9  --   --  8.6  HGB 12.1*   < > 11.9* 12.4*  HCT 35.5*   < > 35.0* 36.0*  PLT 93*  --   --  101*   < > = values in this interval not displayed.   BMET:  Recent Labs    01/26/20 1901 01/26/20 2044 01/26/20 2151 01/27/20 0319  NA 138   < > 139 137  K 4.4   < > 4.1 4.3  CL 110  --   --  108  CO2 21*  --   --  22  GLUCOSE 134*  --   --  151*  BUN 13  --   --  14  CREATININE 0.60*  --   --  0.65  CALCIUM 7.4*  --   --  7.9*   < > = values in this interval not displayed.    CMET: Lab Results  Component Value Date   WBC 8.6 01/27/2020   HGB 12.4 (L)  01/27/2020   HCT 36.0 (L) 01/27/2020   PLT 101 (L) 01/27/2020   GLUCOSE 151 (H) 01/27/2020   CHOL 174 01/06/2020   TRIG 73 01/06/2020   HDL 60 01/06/2020   LDLCALC 99 01/06/2020   ALT 22 01/22/2020   AST 22 01/22/2020   NA 137 01/27/2020   K 4.3 01/27/2020   CL 108 01/27/2020   CREATININE 0.65 01/27/2020   BUN 14 01/27/2020   CO2 22 01/27/2020   TSH 1.84 01/15/2020   INR 1.4 (H) 01/26/2020   HGBA1C 9.4 (H) 01/22/2020   MICROALBUR 7.2 (H) 05/20/2018      PT/INR:  Recent Labs    01/26/20 1342  LABPROT 16.5*  INR 1.4*   Radiology: [facility] Chest Port 1 View  Result Date: 01/26/2020 CLINICAL DATA:  Status post CABG. EXAM: PORTABLE CHEST 1 VIEW COMPARISON:  Chest x-ray dated Jan 22, 2020. FINDINGS: Endotracheal tube in position with the tip 2.8 cm above the level of the carina. Enteric tube tip just inside the stomach with the distal side port in the lower esophagus. Right internal jugular Swan-Ganz catheter with the tip in the main pulmonary outflow tract. Mediastinal and left chest tubes are in good position. Unchanged left chest wall port catheter. Interval CABG. Normal heart size. Normal pulmonary vascularity. Left basilar atelectasis. No significant pleural effusion. No pneumothorax. No acute osseous abnormality. IMPRESSION: 1. Lines and tubes as above. Enteric tube tip is just inside the stomach with the distal side port in the lower esophagus. Recommend advancement. 2. Interval CABG.  Left basilar atelectasis. Electronically Signed   By: Titus Dubin M.D.   On: 01/26/2020 14:08   ECHO INTRAOPERATIVE TEE  Result Date: 01/26/2020  *INTRAOPERATIVE TRANSESOPHAGEAL REPORT *  Patient Name:   Mark Foster   Date of Exam: 01/26/2020 Medical Rec #:  [id number]      Height:       70.0 in Accession #:    [id number]     Weight:       163.0 lb Date of Birth:  [birth year]       BSA:          1.91 m Patient Age:    64 years       BP:           120/63 mmHg Patient Gender: M  HR:            74 bpm. Exam Location:  Anesthesiology Transesophogeal exam was perform intraoperatively during surgical procedure. Patient was closely monitored under general anesthesia during the entirety of examination. Indications:     I25.10 CAD Performing Phys: [id number] STEVEN C HENDRICKSON Complications: No known complications during this procedure. POST-OP IMPRESSIONS Overall, there were no significant changes from pre-bypass. PRE-OP FINDINGS  Left Ventricle: The left ventricle has hyperdynamic systolic function, with an ejection fraction of >65% 70%. The cavity size was normal. There is no increase in left ventricular wall thickness. No evidence of left ventricular regional wall motion abnormalities. Right Ventricle: The right ventricle has normal systolic function. The cavity was normal. There is no increase in right ventricular wall thickness. Left Atrium: Left atrial size was normal in size. The left atrial appendage is well visualized and there is no evidence of thrombus present. Right Atrium: Right atrial size was normal in size. Right atrial pressure is estimated at 10 mmHg. Interatrial Septum: No atrial level shunt detected by color flow Doppler. Pericardium: There is no evidence of pericardial effusion. Mitral Valve: The mitral valve is normal in structure. Mild thickening of the mitral valve leaflet. Mild calcification of the mitral valve leaflet. Mitral valve regurgitation is not visualized by color flow Doppler. There is No evidence of mitral stenosis. Tricuspid Valve: The tricuspid valve was normal in structure. Tricuspid valve regurgitation is trivial by color flow Doppler. The jet is directed centrally. The tricuspid valve is mildly thickened. The tricuspid valve is mildly calcified. Aortic Valve: The aortic valve is normal in structure. There is mild thickening of the aortic valve and There is mild calcification of the aortic valve Aortic valve regurgitation was not visualized by color flow Doppler. There is no  stenosis of the aortic valve. Pulmonic Valve: The pulmonic valve was normal in structure. Pulmonic valve regurgitation is trivial by color flow Doppler. Aorta: The aortic root and ascending aorta are normal in size and structure. The aortic arch was not well visualized.  Duane Boston MD Electronically signed by Duane Boston MD Signature Date/Time: 01/26/2020/2:11:28 PM    Final      Assessment/Plan: S/P Procedure(s) (LRB): CORONARY ARTERY BYPASS GRAFTING (CABG) TIMES FOUR USING LEFT INTERNAL MAMMARY VEIN AND RIGHT GREATER SAPHENOUS VEIN (N/A) TRANSESOPHAGEAL ECHOCARDIOGRAM (TEE) (N/A) Endovein Harvest Of Greater Saphenous Vein (Right)  POD # 1 Doing well CV- good hemodynamics and in SR  Dc Swan and A line  ASA, beta blocker, statin  NEURO- alert with no new issues, resume Plavix in next 48-72 hours  RESP- bibasilar atelectasis. Small air leak. 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 CBG moderately elevated on low dose insulin drip. Will transition to levemir + meal coverage + SSI  On synthroid at home dose  GI- advance diet as tolerated. protonix for ulcer prophylaxis  Thrombocytopenia- mild, follow  SCD + enoxaparin for DVT prophylaxis  John Giovanni 01/27/2020 7:42 AM

## 2020-01-27 NOTE — Discharge Summary (Signed)
Physician Discharge Summary  Patient ID: Mark Foster MRN: [id number] DOB/AGE: [birth year] 65 y.o.  Admit date: 01/26/2020 Discharge date: 01/31/2020  Admission Diagnoses:  Patient Active Problem List   Diagnosis Date Noted   Abnormal cardiac CT angiography 01/08/2020   Angina pectoris ([place])    CVA (cerebral vascular accident) ([place]) 10/08/2019   Injury of left ankle 03/24/2019   Port-A-Cath in place 12/19/2018   Genetic testing 11/15/2018   Loss of consciousness ([place]) 11/12/2018   Generalized weakness 11/11/2018   Family history of pancreatic cancer    Hyperglycemia 10/09/2018   Tobacco abuse counseling 10/09/2018   Pancreatic adenocarcinoma ([place]) 09/03/2018   H/O ETOH abuse 09/03/2018   Hyperbilirubinemia 09/03/2018   Vertebral artery stenosis, symptomatic, without infarction, left 03/21/2018   Uncontrolled type 2 diabetes mellitus with hyperglycemia ([place]) 03/05/2018   Hypothyroidism 03/05/2018   Intracranial atherosclerosis 01/22/2018   Spinal stenosis in cervical region    Diabetes mellitus type 2 in nonobese [facility])    Left carotid artery occlusion 01/21/2018   Cervical stenosis of spinal canal 01/21/2018   Right hand weakness 01/21/2018   Numbness of right hand 01/21/2018   Essential hypertension, benign 01/21/2018   Mixed hyperlipidemia 01/21/2018   Diabetes ([place]) 01/21/2018   Hx of completed stroke 01/21/2018   Leukopenia 01/21/2018   Thrombocytopenia ([place]) 01/21/2018   L MCA Stroke-like episode ([place]) s/p IV tPA 01/19/2018    Discharge Diagnoses:  Active Problems:   S/P CABG x 4  Patient Active Problem List   Diagnosis Date Noted   S/P CABG x 4 01/26/2020   Abnormal cardiac CT angiography 01/08/2020   Angina pectoris ([place])    CVA (cerebral vascular accident) ([place]) 10/08/2019   Injury of left ankle 03/24/2019   Port-A-Cath in place 12/19/2018   Genetic testing 11/15/2018   Loss of consciousness ([place]) 11/12/2018    Generalized weakness 11/11/2018   Family history of pancreatic cancer    Hyperglycemia 10/09/2018   Tobacco abuse counseling 10/09/2018   Pancreatic adenocarcinoma ([place]) 09/03/2018   H/O ETOH abuse 09/03/2018   Hyperbilirubinemia 09/03/2018   Vertebral artery stenosis, symptomatic, without infarction, left 03/21/2018   Uncontrolled type 2 diabetes mellitus with hyperglycemia ([place]) 03/05/2018   Hypothyroidism 03/05/2018   Intracranial atherosclerosis 01/22/2018   Spinal stenosis in cervical region    Diabetes mellitus type 2 in nonobese ([facility])    Left carotid artery occlusion 01/21/2018   Cervical stenosis of spinal canal 01/21/2018   Right hand weakness 01/21/2018   Numbness of right hand 01/21/2018   Essential hypertension, benign 01/21/2018   Mixed hyperlipidemia 01/21/2018   Diabetes ([place]) 01/21/2018   Hx of completed stroke 01/21/2018   Leukopenia 01/21/2018   Thrombocytopenia ([place]) 01/21/2018   L MCA Stroke-like episode ([place]) s/p IV tPA 01/19/2018    HPI: at time of consultation  Mark Foster is sent for consultation regarding three-vessel coronary disease.  Mark Foster is a 64 year old man with a complex medical history including a stroke in 2011 and more recently in February 2021, left internal carotid occlusion, pancreatic cancer status post distal pancreatectomy and splenectomy, type 2 insulin-dependent diabetes complicated by diabetic neuropathy, hypothyroidism, cervical spinal stenosis.    He was admitted in February after a syncopal spell.  He was found to have multiple small strokes.  He had an extensive work-up including TEE with a bubble study which showed normal LV function and no significant valvular pathology.  He had carotid Dopplers which showed a chronic total occlusion of the left ICA and less  than 50% narrowing of the right ICA.  CT coronary angiography on 12/29/1999 showed three-vessel disease.  He had a 30-day event monitor with no  evidence of atrial fibrillation or flutter.  He had cardiac catheterization on 01/08/2020 which showed three-vessel coronary disease.  FFR confirmed hemodynamic significance in the LAD and circumflex.  He has been having some chest pressure.  He says this will come on sporadically.  Sometimes occurs at rest.  He does not get it with a consistent degree of exercise.  He still has some difficulty with balance.  He has walks with a cane for several years.  He has not had any other syncopal episodes.    Discharged Condition: good  Hospital Course: The patient was admitted for elective CABG and on 01/26/2020 was taken the operating room where he underwent the below described procedure.  He tolerated it well was taken to the surgical intensive care unit in stable condition.  Postoperative hospital course: The patient is overall progressed well.  He was extubated without difficulty using standard protocols.  He has maintained stable hemodynamics on no pressor/inotropic support postoperatively.  He has maintained normal sinus rhythm.  He has no neurologic issues and will be resumed on Plavix prior to discharge.  He has a very minor expected acute blood loss anemia which is stable.  Renal function has remained within normal limits.  All routine lines, monitors and drainage devices have been discontinued in the standard fashion.  Incisions are noted to be healing well without evidence of infection.  He is tolerating a diet and routine advancement activity level. He continued to progress. His epicardial pacing wires were removed on 5/28. Today, he is ambulating with limited assistance, tolerating room air, his incisions are healing well, and he is ready for discharge home.   Consults: None  Significant Diagnostic Studies:routine post op labs and serial CXR's Treatments: OPERATIVE REPORT  DATE OF PROCEDURE:  01/26/2020  PREOPERATIVE DIAGNOSES:  Three-vessel coronary artery disease.  POSTOPERATIVE  DIAGNOSIS:  Three-vessel coronary artery disease.  PROCEDURE:   1.  Median sternotomy, extracorporeal circulation coronary artery bypass grafting x4 (left internal mammary artery to left anterior descending, saphenous vein graft to first diagonal, saphenous vein graft to obtuse marginal 1, saphenous vein graft to  posterior descending).  2.   Endoscopic vein harvest, right leg.  SURGEON:  Modesto Charon, MD  ASSISTANT:  Jadene Pierini, PA-C  ANESTHESIA:  General.   Ost LAD to Prox LAD lesion is 75% stenosed. Prox LAD to Mid LAD lesion is 55% stenosed -this segment was considered significant by CT FFR  Mid LAD to Dist LAD lesion is 40% stenosed.  Dist (apical) LAD lesion is 75% stenosed.  Prox Cx to Dist Cx lesion is 65% stenosed. Long irregular lesion-significant by CT FFR  Prox RCA to Mid RCA lesion is 75% stenosed. Mid RCA lesion is 80% stenosed -lesions in tandem significant by CT FFR  Dist RCA-1 lesion is 55% stenosed.  Dist RCA-2 lesion is 80% stenosed with 80% stenosed side branch in RPDA. Significant by CT FFR  LV end diastolic pressure is mildly elevated.  There is no aortic valve stenosis.    Severe multivessel CAD involving the proximal third of the LAD, mid LCx and mid-distal RCA confirming findings seen on coronary CTA -> recommend CABG  Mildly elevated LVEDP   We will discharge home after bedrest.  He has outpatient CVTS consultation scheduled. Will need continued aggressive cardiovascular risk factor modification with guideline directed medical therapy.  Mark Hew, MD    Recommendations  Antiplatelet/Anticoag Recommend Aspirin 59m daily for moderate CAD. With multivessel CAD, the patient would benefit from CABG over PCI.  Discharge Date In the absence of any other complications or medical issues, we expect the patient to be ready for discharge from a cath perspective on 01/08/2020. Will arrange outpatient CT surgical consultation for CABG ->  he will be scheduled to see Dr. HRoxan Hockeyon Tuesday, May 11.  Surgeon Notes    01/26/2020 5:15 PM Operative Note filed by HMelrose Nakayama MD    01/26/2020 3:07 PM Brief Op Note signed by HMelrose Nakayama MD    01/26/2020 1:25 PM Operative Note - Scan signed by Default, Provider, MD  Indications  Progressive angina ([place] [I20.0 (ICD-10-CM)]  Abnormal cardiac CT angiography [[U13.2(ICD-10-CM)]  Procedural Details  Technical Details Primary Care Provider: GAilene Ards NP Cardiologist: Dr. KCharlynn CourtMKlugeris a 64year old gentleman with PMH notable for left carotid artery occlusion -  left carotid artery occlusion, history of pancreatic [facility], and [facility]. He also had a stroke in 2011, history of pancreatic cancer [facility].  He was seen by Dr. KBronson Ingfor progressively worsening exertional fatigue and dyspnea with intermittent episodes of precordial chest pain with exertion that was concerning for angina.  He had TEE in March, followed by recent CT coronary angiography performed and was seen back by Dr. KBronson Ingto review the results.  TEE which I performed on 11/18/2019 demonstrated normal LV systolic function and regional wall motion, EF 60 to 65%.  There is no evidence of left atrial appendage thrombus nor evidence of an interatrial shunt with bubble study.  Coronary CT angiography on 12/29/2019 demonstrated significant stenosis of the proximal/mid/distal LAD, distal circumflex, and mid to distal RCA.  Fractional flow reserve was performed.   With progressively worsening symptoms and highly abnormal coronary CT angiogram suggesting multivessel disease, he is referred for cardiac catheterization.  Time Out: Verified patient identification, verified procedure, site/side was marked,  verified correct patient position, special equipment/implants available, medications/allergies/relevent history reviewed, required imaging and test results available. Performed.  Access:  RIGHT Radial Artery: 6 Fr sheath -- Seldinger technique using Micropuncture Kit -- Direct ultrasound guidance used.  Permanent image obtained and placed on chart. -- 10 mL radial cocktail IA; 4000 Units IV Heparin  Left Heart Catheterization: 5Fr TIG 4.0 Catheter wass advanced or exchanged over a J-wire under direct fluoroscopic guidance into the ascending aorta; this catheter was advanced across the aortic valve for hemodynamic measurement first.  The catheter was then pullback across aortic valve for management of aortic valve gradient and then used to engage first the left and the right coronary arteries for selective cineangiography..  Review of initial angiography revealed: 3-vessel CAD that essentially confirms findings noted on coronary CT angiogram.  Extensive calcification in the proximal third of the LAD with diffuse disease and some focal lesions at the 60-70%.  Mid to distal RCA is diffusely diseased in the mid LCx prior to major bifurcation segment is also diffusely diseased.  All the segments were considered positive by CT FFR and angiographically would be considered significant.  Preparations are made for outpatient referral for CVTS consultation.  Diabetic with multivessel disease.  Upon completion of Angiogaphy, the catheter was removed completely out of the body over a wire,  without complication.  Radial sheath removed in the Cardiac Catheterization lab with TR Band placed for hemostasis.  TR Band: 0920  Hours; 14 mL air  MEDICATIONS SQ Lidocaine 3 mL Radial Cocktail: 3 mg Verapmil in 10 mL NS Heparin: 4000 Units  Estimated blood loss <50 mL.   During this procedure medications were administered to achieve and maintain moderate conscious sedation while the patient's heart rate, blood  pressure, and oxygen saturation were continuously monitored and I was present face-to-face 100% of this time.  Medications (Filter: Administrations occurring from 01/08/20 0825 to 01/08/20 0925) fentaNYL (SUBLIMAZE) injection (mcg) Total dose:  25 mcg  Date/Time  Rate/Dose/Volume Action  01/08/20 0850  25 mcg Given    midazolam (VERSED) injection (mg) Total dose:  2 mg  Date/Time  Rate/Dose/Volume Action  01/08/20 0851  2 mg Given    lidocaine (PF) (XYLOCAINE) 1 % injection (mL) Total volume:  2 mL  Date/Time  Rate/Dose/Volume Action  01/08/20 0852  2 mL Given    Radial Cocktail/Verapamil only (mL) Total volume:  10 mL  Date/Time  Rate/Dose/Volume Action  01/08/20 0855  10 mL Given    heparin sodium (porcine) injection (Units) Total dose:  4,000 Units  Date/Time  Rate/Dose/Volume Action  01/08/20 0857  4,000 Units Given    Heparin (Porcine) in NaCl 1000-0.9 UT/500ML-% SOLN (mL) Total volume:  1,000 mL  Date/Time  Rate/Dose/Volume Action  01/08/20 0900  500 mL Given  0901  500 mL Given    iohexol (OMNIPAQUE) 350 MG/ML injection (mL) Total volume:  55 mL  Date/Time  Rate/Dose/Volume Action  01/08/20 0917  55 mL Given    Sedation Time  Sedation Time Physician-1: 24 minutes 51 seconds  Contrast  Medication Name Total Dose  iohexol (OMNIPAQUE) 350 MG/ML injection 55 mL    Radiation/Fluoro  Fluoro time: 5.1 (min) DAP: 13273 (mGycm2) Cumulative Air Kerma: 235 (mGy)  Complications  Complications documented before study signed (01/08/2020 57:32 AM)   No complications were associated with this study.  Documented by Leonie Man, MD - 01/08/2020 9:44 AM    Coronary Findings  Diagnostic Dominance: Right Left Main  Vessel was injected. Vessel is large. Has a somewhat flat appearance. No significant stenosis, but calcified  Left Anterior Descending  Vessel is moderate in size. There is mild diffuse disease throughout the vessel. The vessel is moderately  calcified. Proximal third vessel  Ost LAD to Prox LAD lesion 75% stenosed  Ost LAD to Prox LAD lesion is 75% stenosed. The lesion is focal, eccentric and irregular. The lesion is moderately calcified.  Prox LAD to Mid LAD lesion 55% stenosed  Prox LAD to Mid LAD lesion is 55% stenosed. The lesion is segmental and irregular. By CT angiogram-FFR, beyond this segment was considered physiologically significant.  Mid LAD to Dist LAD lesion 40% stenosed  Mid LAD to Dist LAD lesion is 40% stenosed.  Dist LAD lesion 75% stenosed  Dist LAD lesion is 75% stenosed. The lesion is focal.  First Diagonal Branch  Vessel is small in size.  Second Diagonal Branch  Vessel is small in size.  Third Diagonal Branch  Vessel is small in size.  Left Circumflex  Prox Cx to Dist Cx lesion 65% stenosed  Prox Cx to Dist Cx lesion is 65% stenosed. The lesion is segmental and irregular. The lesion is moderately calcified. CT FFR beyond this segment was considered significant with a score of 0.5  Second Obtuse Marginal Branch  Vessel is small in size.  Left Posterior Atrioventricular Artery  Vessel is small in size.  Right Coronary Artery  Vessel was injected. Vessel is large. There is mild diffuse disease throughout the vessel. The vessel is tortuous.  Prox RCA to Mid RCA lesion 75% stenosed  Prox RCA to Mid RCA lesion is 75% stenosed. The lesion is located at the bend, eccentric and irregular.  Mid RCA lesion 80% stenosed  Mid RCA lesion is 80% stenosed. The lesion is focal, eccentric and irregular.  Dist RCA-1 lesion 55% stenosed  Dist RCA-1 lesion is 55% stenosed. The lesion is segmental, eccentric and irregular.  Dist RCA-2 lesion 80% stenosed with side branch in RPDA 80% stenosed  Dist RCA-2 lesion is 80% stenosed with 80% stenosed side branch in RPDA. The lesion is located at the bifurcation.  Acute Marginal Branch  Vessel is small in size.  Right Ventricular Branch  Vessel is small in size.  Right  Posterior Descending Artery  Vessel is moderate in size.  First Right Posterolateral Branch  Vessel is small in size.  Second Right Posterolateral Branch  Vessel is small in size.  Third Right Posterolateral Branch  Vessel is small in size.  Intervention  No interventions have been documented. Wall Motion  Resting    No left-ventriculogram        Left Heart  Left Ventricle LV end diastolic pressure is mildly elevated.  Aortic Valve There is no aortic valve stenosis. The aortic valve is calcified.  Coronary Diagrams  Diagnostic Dominance: Right  Intervention       Discharge Exam: Blood pressure 99/63, pulse 91, temperature 99 F (37.2 C), temperature source Oral, resp. rate 16, height _0  (1.778 m), weight 79.4 kg, SpO2 94 %.   General appearance: alert, cooperative and no distress Heart: regular rate and rhythm, S1, S2 normal, no murmur, click, rub or gallop Lungs: clear to auscultation bilaterally Abdomen: soft, non-tender; bowel sounds normal; no masses,  no organomegaly Extremities: extremities normal, atraumatic, no cyanosis or edema Wound: clean and dry  Disposition:  Discharge disposition: 01-Home or Self Care       Discharge Instructions    Amb Referral to Cardiac Rehabilitation   Complete by: As directed    Diagnosis: CABG   CABG X ___: 4   After initial evaluation and assessments completed: Virtual Based Care may be provided alone or in conjunction with Phase 2 Cardiac Rehab based on patient barriers.: Yes     Allergies as of 01/31/2020      Reactions   Demerol [meperidine Hcl] Other (See Comments)   convulsions      Medication List    STOP taking these medications   ECHINACEA EXTRACT PO   nitroGLYCERIN 0.4 MG SL tablet Commonly known as: NITROSTAT   traMADol 50 MG tablet Commonly known as: ULTRAM     TAKE these medications   acetaminophen 500 MG tablet Commonly known as: TYLENOL Take 2 tablets (1,000 mg total) by mouth  every 6 (six) hours.   aspirin 81 MG EC tablet Take 1 tablet (81 mg total) by mouth daily.   atorvastatin 80 MG tablet Commonly known as: LIPITOR TAKE 1 TABLET(80 MG) BY MOUTH DAILY AT 6 PM What changed:   how much to take  how to take this  when to take this  additional instructions   clopidogrel 75 MG tablet Commonly known as: PLAVIX Take 1 tablet (75 mg total) by mouth daily.   ezetimibe 10 MG tablet Commonly known as: ZETIA Take 1 tablet (  10 mg total) by mouth daily.   gabapentin 300 MG capsule Commonly known as: NEURONTIN Take 1 capsule (300 mg total) by mouth 2 (two) times daily.   insulin aspart 100 UNIT/ML FlexPen Commonly known as: NOVOLOG Inject 6 Units into the skin 3 (three) times daily with meals. What changed: how much to take   Insulin Pen Needle 32G X 4 MM Misc 1 Device by Does not apply route daily.   Lantus SoloStar 100 UNIT/ML Solostar Pen Generic drug: insulin glargine Inject 36 Units into the skin at bedtime.   levothyroxine 50 MCG tablet Commonly known as: SYNTHROID Take 1 tablet (50 mcg total) by mouth daily.   lidocaine-prilocaine cream Commonly known as: EMLA Apply 1 application topically daily as needed (prior to port being accessed (~every 3 months)).   loperamide 2 MG tablet Commonly known as: Imodium A-D Take 1 tablet (2 mg total) by mouth as needed. Take 2 at diarrhea onset , then 1 every 2hr until 12hrs with no BM. May take 2 every 4hrs at night. If diarrhea recurs repeat. What changed:   reasons to take this  additional instructions   meclizine 25 MG tablet Commonly known as: ANTIVERT Take 1 tablet (25 mg total) by mouth 3 (three) times daily as needed for dizziness.   metoprolol tartrate 25 MG tablet Commonly known as: LOPRESSOR Take 1 tablet (25 mg total) by mouth 2 (two) times daily.   Omega-3 1000 MG Caps Take 1,000 mg by mouth daily.   oxyCODONE 5 MG immediate release tablet Commonly known as: Oxy  IR/ROXICODONE Take 1 tablet (5 mg total) by mouth every 6 (six) hours as needed for severe pain.   pantoprazole 20 MG tablet Commonly known as: PROTONIX Take 20 mg by mouth daily as needed (heartburn/indigestion.). What changed: Another medication with the same name was removed. Continue taking this medication, and follow the directions you see here.   sertraline 50 MG tablet Commonly known as: ZOLOFT TAKE 1 TABLET BY MOUTH DAILY   tamsulosin 0.4 MG Caps capsule Commonly known as: FLOMAX Take 1 capsule (0.4 mg total) by mouth daily.   vitamin B-12 1000 MCG tablet Commonly known as: CYANOCOBALAMIN Take 1,000 mcg by mouth daily.            Durable Medical Equipment  (From admission, onward)         Start     Ordered   01/30/20 1208  For home use only DME 4 wheeled rolling walker with seat  Once    Question:  Patient needs a walker to treat with the following condition  Answer:  Weakness generalized   01/30/20 1207         Follow-up Information    Melrose Nakayama, MD Follow up.   Specialty: Cardiothoracic Surgery Why: Please see discharge paperwork for follow-up appointment with your surgeon.  On the day you see the surgeon obtain a chest x-ray at [facility] 1/2-hour prior to appointment.  It is in the same office complex. Contact information: [address] [place] [address] [phone]        Leonie Man, MD Follow up.   Specialty: Cardiology Why: Please see discharge paperwork for 2-week follow-up appointment with cardiology Contact information: [address] [place] [address] [phone]        Ailene Ards, NP. Call in 1 day(s).   Specialty: Nurse Practitioner Contact information: [place] [address] [phone]  The patient has been discharged on:   1.Beta Blocker:  Yes [ yes  ]                              No   [   ]                              If No,  reason:  2.Ace Inhibitor/ARB: Yes [   ]                                     No  [ no   ]                                     If No, reason: normal to low BP  3.Statin:   Yes [  yes ]                  No  [   ]                  If No, reason:  4.Ecasa:  Yes  [ yes  ]                  No   [   ]                  If No, reason:   Signed: Elgie Collard PA-C 01/31/2020, 7:51 AM

## 2020-01-28 ENCOUNTER — Inpatient Hospital Stay (HOSPITAL_COMMUNITY): Payer: Medicare HMO

## 2020-01-28 LAB — TYPE AND SCREEN
ABO/RH(D): A POS
Antibody Screen: NEGATIVE
Unit division: 0
Unit division: 0

## 2020-01-28 LAB — GLUCOSE, CAPILLARY
Glucose-Capillary: 185 mg/dL — ABNORMAL HIGH (ref 70–99)
Glucose-Capillary: 282 mg/dL — ABNORMAL HIGH (ref 70–99)
Glucose-Capillary: 249 mg/dL — ABNORMAL HIGH (ref 70–99)
Glucose-Capillary: 242 mg/dL — ABNORMAL HIGH (ref 70–99)
Glucose-Capillary: 205 mg/dL — ABNORMAL HIGH (ref 70–99)

## 2020-01-28 LAB — BASIC METABOLIC PANEL
Anion gap: 6 (ref 5–15)
BUN: 25 mg/dL — ABNORMAL HIGH (ref 8–23)
CO2: 24 mmol/L (ref 22–32)
Calcium: 8.1 mg/dL — ABNORMAL LOW (ref 8.9–10.3)
Chloride: 105 mmol/L (ref 98–111)
Creatinine, Ser: 0.8 mg/dL (ref 0.61–1.24)
GFR calc Af Amer: >60 mL/min (ref >60)
GFR calc non Af Amer: >60 mL/min (ref >60)
Glucose, Bld: 230 mg/dL — ABNORMAL HIGH (ref 70–99)
Potassium: 4 mmol/L (ref 3.5–5.1)
Sodium: 135 mmol/L (ref 135–145)

## 2020-01-28 LAB — BPAM RBC
Blood Product Expiration Date: 202106132359
Blood Product Expiration Date: 202106142359
ISSUE DATE / TIME: 202105240759
ISSUE DATE / TIME: 202105240759
Unit Type and Rh: 6200
Unit Type and Rh: 6200

## 2020-01-28 LAB — CBC
HCT: 34.2 % — ABNORMAL LOW (ref 39.0–52.0)
Hemoglobin: 11.7 g/dL — ABNORMAL LOW (ref 13.0–17.0)
MCH: 32.4 pg (ref 26.0–34.0)
MCHC: 34.2 g/dL (ref 30.0–36.0)
MCV: 94.7 fL (ref 80.0–100.0)
Platelets: 110 10*3/uL — ABNORMAL LOW (ref 150–400)
RBC: 3.61 MIL/uL — ABNORMAL LOW (ref 4.22–5.81)
RDW: 14.6 % (ref 11.5–15.5)
WBC: 8 10*3/uL (ref 4.0–10.5)
nRBC: 0 % (ref 0.0–0.2)

## 2020-01-28 MED ORDER — CHLORHEXIDINE GLUCONATE CLOTH 2 % EX PADS
6.0000 | MEDICATED_PAD | Freq: Every day | CUTANEOUS | Status: DC
  Administered 2020-01-31: 6 via TOPICAL

## 2020-01-28 MED ORDER — ~~LOC~~ CARDIAC SURGERY, PATIENT & FAMILY EDUCATION: Freq: Once | Status: AC

## 2020-01-28 MED ORDER — MAGNESIUM HYDROXIDE 400 MG/5ML PO SUSP: 30.0000 mL | Freq: Every day | ORAL | Status: DC | PRN

## 2020-01-28 MED ORDER — CLOPIDOGREL BISULFATE 75 MG PO TABS
75.0000 mg | ORAL_TABLET | Freq: Every day | ORAL | Status: DC
  Administered 2020-01-29 – 2020-01-31 (×3): 75 mg via ORAL
  Filled 2020-01-28 (×3): qty 1

## 2020-01-28 MED ORDER — ZOLPIDEM TARTRATE 5 MG PO TABS: 5.0000 mg | ORAL_TABLET | Freq: Every evening | ORAL | Status: DC | PRN

## 2020-01-28 MED ORDER — SODIUM CHLORIDE 0.9% FLUSH: 3.0000 mL | INTRAVENOUS | Status: DC | PRN

## 2020-01-28 MED ORDER — SODIUM CHLORIDE 0.9% FLUSH
3.0000 mL | Freq: Two times a day (BID) | INTRAVENOUS | Status: DC
  Administered 2020-01-28 – 2020-01-31 (×5): 3 mL via INTRAVENOUS

## 2020-01-28 MED ORDER — ALUM & MAG HYDROXIDE-SIMETH 200-200-20 MG/5ML PO SUSP: 15.0000 mL | Freq: Four times a day (QID) | ORAL | Status: DC | PRN

## 2020-01-28 MED ORDER — INSULIN DETEMIR 100 UNIT/ML ~~LOC~~ SOLN
20.0000 [IU] | Freq: Two times a day (BID) | SUBCUTANEOUS | Status: DC
  Administered 2020-01-28 (×2): 20 [IU] via SUBCUTANEOUS
  Filled 2020-01-28 (×5): qty 0.2

## 2020-01-28 MED ORDER — SODIUM CHLORIDE 0.9 % IV SOLN: 250.0000 mL | INTRAVENOUS | Status: DC | PRN

## 2020-01-28 MED ORDER — INSULIN ASPART 100 UNIT/ML ~~LOC~~ SOLN
6.0000 [IU] | Freq: Three times a day (TID) | SUBCUTANEOUS | Status: DC
  Administered 2020-01-28 – 2020-01-30 (×4): 6 [IU] via SUBCUTANEOUS

## 2020-01-28 NOTE — Progress Notes (Signed)
CARDIAC REHAB PHASE I   PRE:  Rate/Rhythm: 86 SR  BP:  Sitting: 89/64      SaO2: 94 RA  MODE:  Ambulation: 190 ft   POST:  Rate/Rhythm: 91 SR  BP:  Sitting: 93/67    SaO2: 96 RA  Pt assisted to EOB and stood mod assist with gait belt. Pt then ambulated 138ft in hallway with slow gait and EVA. Pt assisted back to bed. Encouraged continued IS use, walks, and sternal precautions. Call bell and bedside table within reach. Will continue to follow.  [id number] Rufina Falco, RN BSN 01/28/2020 1:40 PM

## 2020-01-28 NOTE — Progress Notes (Signed)
Pt arrived to rm 27 from [place]. Initiated tele. VSS. CHG and assessment performed. Call bell within reach.   Lavenia Atlas, RN

## 2020-01-28 NOTE — Progress Notes (Addendum)
2 Days Post-Op Procedure(s) (LRB): CORONARY ARTERY BYPASS GRAFTING (CABG) TIMES FOUR USING LEFT INTERNAL MAMMARY VEIN AND RIGHT GREATER SAPHENOUS VEIN (N/A) TRANSESOPHAGEAL ECHOCARDIOGRAM (TEE) (N/A) Endovein Harvest Of Greater Saphenous Vein (Right) Subjective: C/o pain  Objective: Vital signs in last 24 hours: Temp:  [98 F (36.7 C)-99 F (37.2 C)] 98.5 F (36.9 C) (05/26 0625) Pulse Rate:  [75-116] 99 (05/26 0700) Cardiac Rhythm: Sinus tachycardia (05/26 0400) Resp:  [9-26] 22 (05/26 0700) BP: (79-118)/(54-77) 112/74 (05/26 0700) SpO2:  [92 %-100 %] 95 % (05/26 0700) Arterial Line BP: (102-124)/(47-53) 110/47 (05/25 0830) Weight:  [78.9 kg] 78.9 kg (05/26 0500)  Hemodynamic parameters for last 24 hours: PAP: (23-31)/(10-13) 31/13 CO:  [4.8 L/min] 4.8 L/min CI:  [2.5 L/min/m2] 2.5 L/min/m2  Intake/Output from previous day: 05/25 0701 - 05/26 0700 In: 2462.4 [P.O.:1420; I.V.:592.4; IV Piggyback:450] Out: 1350 [Urine:970; Chest Tube:380] Intake/Output this shift: No intake/output data recorded.  General appearance: alert, cooperative and no distress Neurologic: intact Heart: regular rate and rhythm Lungs: diminished breath sounds bibasilar Abdomen: normal findings: soft, non-tender no air leak  Lab Results: Recent Labs    01/27/20 1855 01/28/20 0423  WBC 8.8 8.0  HGB 12.0* 11.7*  HCT 35.2* 34.2*  PLT 103* 110*   BMET:  Recent Labs    01/27/20 1855 01/28/20 0423  NA 134* 135  K 4.5 4.0  CL 104 105  CO2 24 24  GLUCOSE 257* 230*  BUN 25* 25*  CREATININE 0.99 0.80  CALCIUM 8.0* 8.1*    PT/INR:  Recent Labs    01/26/20 1342  LABPROT 16.5*  INR 1.4*   ABG    Component Value Date/Time   PHART 7.314 (L) 01/26/2020 2151   HCO3 23.1 01/26/2020 2151   TCO2 24 01/26/2020 2151   ACIDBASEDEF 3.0 (H) 01/26/2020 2151   O2SAT 96.0 01/26/2020 2151   CBG (last 3)  Recent Labs    01/27/20 1641 01/27/20 2129 01/28/20 0624  GLUCAP 203* 261* 185*     Assessment/Plan: S/P Procedure(s) (LRB): CORONARY ARTERY BYPASS GRAFTING (CABG) TIMES FOUR USING LEFT INTERNAL MAMMARY VEIN AND RIGHT GREATER SAPHENOUS VEIN (N/A) TRANSESOPHAGEAL ECHOCARDIOGRAM (TEE) (N/A) Endovein Harvest Of Greater Saphenous Vein (Right) -POD # 2 CV- off of neo drip. In SR  ASA, beta blocker, statin  Restart plavix tomorrow  RESP- small left pneumo on CXR. No air leak with repeated coughing (Ct is basilar). Will dc chest tube and repeat CXR. Continue IS for atelectasis  RENAL- creatinine is normal, BUN mildly elevated, lytes OK  DC Foley  ENDO- CBG elevated, increase levemir and meal coverage, continue SSI  Anemia secondary to ABL- mild, follow  Thrombocytopenia- stable  SCD + Enoxaparin + ambulation for DVT prophylaxis  Possible transfer to tele later today   LOS: 2 days    Melrose Nakayama 01/28/2020

## 2020-01-28 NOTE — Plan of Care (Signed)
  Problem: Clinical Measurements: Goal: Respiratory complications will improve Outcome: Progressing Goal: Cardiovascular complication will be avoided Outcome: Progressing   Problem: Activity: Goal: Risk for activity intolerance will decrease Outcome: Not Progressing   

## 2020-01-28 NOTE — Progress Notes (Signed)
Pt has not voided since foley cath d/c. Bladder scan showed 219ml, pt refused discomfort feeling. Will keep monitor pt. MD aware.   Lavenia Atlas, RN

## 2020-01-29 ENCOUNTER — Ambulatory Visit: Payer: Medicare HMO | Admitting: "Endocrinology

## 2020-01-29 ENCOUNTER — Inpatient Hospital Stay (HOSPITAL_COMMUNITY): Payer: Medicare HMO

## 2020-01-29 LAB — BASIC METABOLIC PANEL
Anion gap: 5 (ref 5–15)
BUN: 22 mg/dL (ref 8–23)
CO2: 27 mmol/L (ref 22–32)
Calcium: 8.1 mg/dL — ABNORMAL LOW (ref 8.9–10.3)
Chloride: 104 mmol/L (ref 98–111)
Creatinine, Ser: 0.81 mg/dL (ref 0.61–1.24)
GFR calc Af Amer: >60 mL/min (ref >60)
GFR calc non Af Amer: >60 mL/min (ref >60)
Glucose, Bld: 78 mg/dL (ref 70–99)
Potassium: 3.7 mmol/L (ref 3.5–5.1)
Sodium: 136 mmol/L (ref 135–145)

## 2020-01-29 LAB — GLUCOSE, CAPILLARY
Glucose-Capillary: 64 mg/dL — ABNORMAL LOW (ref 70–99)
Glucose-Capillary: 105 mg/dL — ABNORMAL HIGH (ref 70–99)
Glucose-Capillary: 184 mg/dL — ABNORMAL HIGH (ref 70–99)
Glucose-Capillary: 154 mg/dL — ABNORMAL HIGH (ref 70–99)
Glucose-Capillary: 186 mg/dL — ABNORMAL HIGH (ref 70–99)

## 2020-01-29 LAB — CBC
HCT: 32.8 % — ABNORMAL LOW (ref 39.0–52.0)
Hemoglobin: 11.2 g/dL — ABNORMAL LOW (ref 13.0–17.0)
MCH: 32 pg (ref 26.0–34.0)
MCHC: 34.1 g/dL (ref 30.0–36.0)
MCV: 93.7 fL (ref 80.0–100.0)
Platelets: 130 10*3/uL — ABNORMAL LOW (ref 150–400)
RBC: 3.5 MIL/uL — ABNORMAL LOW (ref 4.22–5.81)
RDW: 14.6 % (ref 11.5–15.5)
WBC: 7.1 10*3/uL (ref 4.0–10.5)
nRBC: 0 % (ref 0.0–0.2)

## 2020-01-29 MED ORDER — INSULIN DETEMIR 100 UNIT/ML ~~LOC~~ SOLN
26.0000 [IU] | Freq: Two times a day (BID) | SUBCUTANEOUS | Status: DC
  Administered 2020-01-29 (×2): 26 [IU] via SUBCUTANEOUS
  Filled 2020-01-29 (×5): qty 0.26

## 2020-01-29 MED ORDER — METOPROLOL TARTRATE 25 MG PO TABS
25.0000 mg | ORAL_TABLET | Freq: Two times a day (BID) | ORAL | Status: DC
  Administered 2020-01-29 – 2020-01-31 (×5): 25 mg via ORAL
  Filled 2020-01-29 (×5): qty 1

## 2020-01-29 MED ORDER — FUROSEMIDE 10 MG/ML IJ SOLN
40.0000 mg | Freq: Once | INTRAMUSCULAR | Status: AC
  Administered 2020-01-29: 40 mg via INTRAVENOUS
  Filled 2020-01-29: qty 4

## 2020-01-29 MED ORDER — LACTULOSE 10 GM/15ML PO SOLN: 20.0000 g | Freq: Once | ORAL | Status: DC

## 2020-01-29 MED ORDER — FUROSEMIDE 40 MG PO TABS: 40.0000 mg | ORAL_TABLET | Freq: Every day | ORAL | Status: DC

## 2020-01-29 MED ORDER — POTASSIUM CHLORIDE CRYS ER 20 MEQ PO TBCR
20.0000 meq | EXTENDED_RELEASE_TABLET | Freq: Every day | ORAL | Status: DC
  Administered 2020-01-31: 20 meq via ORAL
  Filled 2020-01-29 (×2): qty 1

## 2020-01-29 MED ORDER — METOPROLOL TARTRATE 25 MG/10 ML ORAL SUSPENSION
25.0000 mg | Freq: Two times a day (BID) | ORAL | Status: DC
  Filled 2020-01-29 (×6): qty 10

## 2020-01-29 MED ORDER — POTASSIUM CHLORIDE CRYS ER 20 MEQ PO TBCR: 20.0000 meq | EXTENDED_RELEASE_TABLET | Freq: Every day | ORAL | Status: DC

## 2020-01-29 MED ORDER — POTASSIUM CHLORIDE CRYS ER 20 MEQ PO TBCR
30.0000 meq | EXTENDED_RELEASE_TABLET | Freq: Two times a day (BID) | ORAL | Status: DC
  Administered 2020-01-29 – 2020-01-31 (×5): 30 meq via ORAL
  Filled 2020-01-29 (×5): qty 1

## 2020-01-29 MED FILL — Electrolyte-R (PH 7.4) Solution: INTRAVENOUS | Qty: 4000 | Status: AC

## 2020-01-29 MED FILL — Sodium Bicarbonate IV Soln 8.4%: INTRAVENOUS | Qty: 50 | Status: AC

## 2020-01-29 MED FILL — Sodium Chloride IV Soln 0.9%: INTRAVENOUS | Qty: 2000 | Status: AC

## 2020-01-29 MED FILL — Mannitol IV Soln 20%: INTRAVENOUS | Qty: 500 | Status: AC

## 2020-01-29 MED FILL — Heparin Sodium (Porcine) Inj 1000 Unit/ML: INTRAMUSCULAR | Qty: 20 | Status: AC

## 2020-01-29 MED FILL — Lidocaine HCl Local Soln Prefilled Syringe 100 MG/5ML (2%): INTRAMUSCULAR | Qty: 5 | Status: AC

## 2020-01-29 NOTE — Progress Notes (Signed)
Inpatient Diabetes Program Recommendations  AACE/ADA: New Consensus Statement on Inpatient Glycemic Control (2015)  Target Ranges:  Prepandial:   less than 140 mg/dL      Peak postprandial:   less than 180 mg/dL (1-2 hours)      Critically ill patients:  140 - 180 mg/dL   Lab Results  Component Value Date   GLUCAP 105 (H) 01/29/2020   HGBA1C 9.4 (H) 01/22/2020    Review of Glycemic Control Results for [name], Mark Foster (MRN [id number]) as of 01/29/2020 08:04  Ref. Range 01/28/2020 15:09 01/28/2020 16:33 01/28/2020 21:07 01/29/2020 06:04 01/29/2020 07:49  Glucose-Capillary Latest Ref Range: 70 - 99 mg/dL 249 (H) 242 (H) 205 (H) 64 (L) 105 (H)     Current orders for Inpatient glycemic control:  Novolog 0-24 units tid and hs Novolog 6 units tid with meals Levemir 20 units bid  Inpatient Diabetes Program Recommendations:    Hypoglycemic this morning after receiving 8 units of Novolog at bedtime.  Please consider,  Novolog 0-5 units qhs   Thank you, Reche Dixon, RN, BSN Diabetes Coordinator Inpatient Diabetes Program [phone] (team pager from 8a-5p)

## 2020-01-29 NOTE — Progress Notes (Signed)
Patient's CBG 64. Orange juice given; patient set up for breakfast.

## 2020-01-29 NOTE — Progress Notes (Signed)
EPW removed per order. Tips intact. VSS. Pt educated on 1 hour bedrest. Call light in reach. Will continue to monitor.  Clyde Canterbury, RN

## 2020-01-29 NOTE — Progress Notes (Signed)
Mobility Specialist: Progress Note    01/29/20 1709  Mobility  Activity Ambulated in hall  Level of Assistance Modified independent, requires aide device or extra time  Assistive Device Front wheel walker  Distance Ambulated (ft) 440 ft  Mobility Response Tolerated well  Mobility performed by Mobility specialist  $Mobility charge 1 Mobility   Pre-Mobility: 97 HR, 105/63 BP, 99% SpO2 During Mobility: 98% SpO2 Post-Mobility: 103 HR, 115/72 BP, 99% SpO2  Pt tolerated ambulation well. Pt took slow, short steps. Pt took one seated rest break halfway through ambulation. Pt states he had some weakness in his legs. Pt was positioned in bed with call bell and phone within reach.   [facility] Deundra Furber Mobility Specialist

## 2020-01-29 NOTE — Progress Notes (Addendum)
      [address]       [place] [address]             [phone]        3 Days Post-Op Procedure(s) (LRB): CORONARY ARTERY BYPASS GRAFTING (CABG) TIMES FOUR USING LEFT INTERNAL MAMMARY VEIN AND RIGHT GREATER SAPHENOUS VEIN (N/A) TRANSESOPHAGEAL ECHOCARDIOGRAM (TEE) (N/A) Endovein Harvest Of Greater Saphenous Vein (Right)  Subjective: Patient has incisional pain this am. He is passing flatus but no bowel movement.  Objective: Vital signs in last 24 hours: Temp:  [97.9 F (36.6 C)-98.9 F (37.2 C)] 98.7 F (37.1 C) (05/27 0751) Pulse Rate:  [87-106] 101 (05/27 0751) Cardiac Rhythm: Normal sinus rhythm;Sinus tachycardia (05/26 2100) Resp:  [11-23] 20 (05/27 0751) BP: (93-117)/(58-77) 107/70 (05/27 0751) SpO2:  [94 %-97 %] 95 % (05/27 0751) Weight:  [81.4 kg] 81.4 kg (05/27 0340)  Pre op weight 73.9 kg Current Weight  01/29/20 81.4 kg       Intake/Output from previous day: 05/26 0701 - 05/27 0700 In: 727.1 [P.O.:140; I.V.:487.1; IV Piggyback:100] Out: 560 [Urine:500; Chest Tube:60]   Physical Exam:  Cardiovascular: Slightly tachycardic Pulmonary: Diminished bibasilar breath sounds Abdomen: Soft, non tender, bowel sounds present. Extremities: Mild bilateral lower extremity edema. Wounds: Sternal dressing removed and wound is clean and dry.  No erythema or signs of infection. RLE wounds are clean and dry  Lab Results: CBC: Recent Labs    01/28/20 0423 01/29/20 0253  WBC 8.0 7.1  HGB 11.7* 11.2*  HCT 34.2* 32.8*  PLT 110* 130*   BMET:  Recent Labs    01/28/20 0423 01/29/20 0253  NA 135 136  K 4.0 3.7  CL 105 104  CO2 24 27  GLUCOSE 230* 78  BUN 25* 22  CREATININE 0.80 0.81  CALCIUM 8.1* 8.1*    PT/INR:  Lab Results  Component Value Date   INR 1.4 (H) 01/26/2020   INR 1.1 01/22/2020   INR 1.00 06/21/2018   ABG:  INR: Will add last result for INR, ABG once components are confirmed Will add last 4 CBG results once  components are confirmed  Assessment/Plan:  1. CV - Slightly tachycardic with HR in the low 100's this am. On Lopressor 12.5 mg bid but will increase. Also, on Plavix 75 mg daily 2.  Pulmonary - On room air. PA/LAT CXR ordered but not taken yet. Encourage incentive spirometer 3. Volume Overload - Will give Lasix 40 mg daily 4.  Expected post op acute blood loss anemia - H and H this am stable at 11.2 and 32.8 5. DM-CBGs 242/205/64. Pre op HGA1C 9.4. On Insulin but will increase for better glucose control 6. Supplement potassium 7. Mild thrombocytopenia-platelets this am 130,000 8. Hypothyroidism-on Levothyroxine 50 mcg daily  Donielle M ZimmermanPA-C 01/29/2020,7:58 AM Patient seen and examined, agree with above Still having incisional pain but improved since CT removed CBG elevated yesterday but better so far today Probably home in next 24-48 hours if continues to progress  Remo Lipps C. Roxan Hockey, MD [facility] [phone]

## 2020-01-29 NOTE — Progress Notes (Signed)
CARDIAC REHAB PHASE I   PRE:  Rate/Rhythm: 87 SR  BP:  Sitting: 108/69      SaO2: 95 RA  MODE:  Ambulation: 275 ft   POST:  Rate/Rhythm: 95 SR  BP:  Sitting: 122/76    SaO2: 97 RA  Pt mod/max assist to sit on EOB. Pt then ax2 to stand and ambulate in hallway with front wheel walker. Pt ambulated ~151ft with slow short steps and took a seated rest break c/o leg weakness and all over pain. Pt determined to walk back to room, then returned to recliner. Pt states he lives alone and plans to return there. Requested PT consult from RN to make further recommendations. Of note pt states he has a front wheel walker at home, but would need a BSC. CM made aware. Encouraged continued ambulation and IS use. Will continue to follow.  [id number] Rufina Falco, RN BSN 01/29/2020 2:44 PM

## 2020-01-30 LAB — GLUCOSE, CAPILLARY
Glucose-Capillary: 61 mg/dL — ABNORMAL LOW (ref 70–99)
Glucose-Capillary: 66 mg/dL — ABNORMAL LOW (ref 70–99)
Glucose-Capillary: 118 mg/dL — ABNORMAL HIGH (ref 70–99)
Glucose-Capillary: 254 mg/dL — ABNORMAL HIGH (ref 70–99)
Glucose-Capillary: 186 mg/dL — ABNORMAL HIGH (ref 70–99)
Glucose-Capillary: 89 mg/dL (ref 70–99)

## 2020-01-30 MED ORDER — INSULIN DETEMIR 100 UNIT/ML ~~LOC~~ SOLN
20.0000 [IU] | Freq: Two times a day (BID) | SUBCUTANEOUS | Status: DC
  Administered 2020-01-30 – 2020-01-31 (×3): 20 [IU] via SUBCUTANEOUS
  Filled 2020-01-30 (×4): qty 0.2

## 2020-01-30 MED ORDER — FUROSEMIDE 20 MG PO TABS
20.0000 mg | ORAL_TABLET | Freq: Every day | ORAL | Status: DC
  Administered 2020-01-30 – 2020-01-31 (×2): 20 mg via ORAL
  Filled 2020-01-30 (×2): qty 1

## 2020-01-30 MED ORDER — ASPIRIN EC 81 MG PO TBEC
81.0000 mg | DELAYED_RELEASE_TABLET | Freq: Every day | ORAL | Status: DC
  Administered 2020-01-30 – 2020-01-31 (×2): 81 mg via ORAL
  Filled 2020-01-30 (×2): qty 1

## 2020-01-30 NOTE — Progress Notes (Signed)
11:25 - 12:21  Pt and friend agree to education. Pt's friend Joycelyn Schmid will help to care for him at home. Reviewed "move in the tube" activity restrictions. Discussed wound care, bathing and encouraged to get shower chair at home for safety. Joycelyn Schmid has one which she will bring to his home. Reviewed low Na, heart healthy, and diabetic diet. Encouraged daily walking at home and given parameters for weather and s/s to terminate exercise. Stressed importance of medication compliance and f/u with physicians after d/c. Pt/family verbalized understanding of education.  Lesly Rubenstein MS, ACSM-EP-C, CCRP

## 2020-01-30 NOTE — Progress Notes (Signed)
Mobility Specialist: Progress Note    01/30/20 1453  Mobility  Activity Ambulated in hall  Level of Assistance Contact guard assist, steadying assist  Assistive Device Four wheel walker  Distance Ambulated (ft) 240 ft  Mobility Response Tolerated fair  Mobility performed by Mobility specialist  $Mobility charge 1 Mobility   Pre-Mobility: 92 HR, 106/70 BP, 96% SpO2 Post-Mobility: 94 HR, 115/66 BP, 100% SpO2  Pt tolerated ambulation fair. Pt was given pain medicine upon entering room for chest pain. Pt c/o of chest pain during ambulation. Pt states he wants to lay in bed and rest. Pt was positioned in bed with call bell and phone within reach.   [facility] Zamari Vea Mobility Specialist

## 2020-01-30 NOTE — Discharge Instructions (Signed)
Endoscopic Saphenous Vein Harvesting, Care After This sheet gives you information about how to care for yourself after your procedure. Your health care provider may also give you more specific instructions. If you have problems or questions, contact your health care provider. What can I expect after the procedure? After the procedure, it is common to have:  Pain.  Bruising.  Swelling.  Numbness. Follow these instructions at home: Incision care   Follow instructions from your health care provider about how to take care of your incisions. Make sure you: ? Wash your hands with soap and water before and after you change your bandages (dressings). If soap and water are not available, use hand sanitizer. ? Change your dressings as told by your health care provider. ? Leave stitches (sutures), skin glue, or adhesive strips in place. These skin closures may need to stay in place for 2 weeks or longer. If adhesive strip edges start to loosen and curl up, you may trim the loose edges. Do not remove adhesive strips completely unless your health care provider tells you to do that.  Check your incision areas every day for signs of infection. Check for: ? More redness, swelling, or pain. ? Fluid or blood. ? Warmth. ? Pus or a bad smell. Medicines  Take over-the-counter and prescription medicines only as told by your health care provider.  Ask your health care provider if the medicine prescribed to you requires you to avoid driving or using heavy machinery. General instructions  Raise (elevate) your legs above the level of your heart while you are sitting or lying down.  Avoid crossing your legs.  Avoid sitting for long periods of time. Change positions every 30 minutes.  Do any exercises your health care providers have given you. These may include deep breathing, coughing, and walking exercises.  Do not take baths, swim, or use a hot tub until your health care provider approves. Ask your  health care provider if you may take showers. You may only be allowed to take sponge baths.  Wear compression stockings as told by your health care provider. These stockings help to prevent blood clots and reduce swelling in your legs.  Keep all follow-up visits as told by your health care provider. This is important. Contact a health care provider if:  Medicine does not help your pain.  Your pain gets worse.  You have new leg bruises or your leg bruises get bigger.  Your leg feels numb.  You have more redness, swelling, or pain around your incision.  You have fluid or blood coming from your incision.  Your incision feels warm to the touch.  You have pus or a bad smell coming from your incision.  You have a fever. Get help right away if:  Your pain is severe.  You develop pain, tenderness, warmth, redness, or swelling in any part of your leg.  You have chest pain.  You have trouble breathing. Summary  Raise (elevate) your legs above the level of your heart while you are sitting or lying down.  Wear compression stockings as told by your health care provider.  Make sure you know which symptoms should prompt you to contact your health care provider.  Keep all follow-up visits as told by your health care provider. This information is not intended to replace advice given to you by your health care provider. Make sure you discuss any questions you have with your health care provider. Document Revised: 07/29/2018 Document Reviewed: 07/29/2018 Elsevier Patient Education    2020 [employer] Coronary Artery Bypass Grafting, Care After This sheet gives you information about how to care for yourself after your procedure. Your doctor may also give you more specific instructions. If you have problems or questions, call your doctor. What can I expect after the procedure? After the procedure, it is common to:  Feel sick to your stomach (nauseous).  Not want to eat as much as  normal (lack of appetite).  Have trouble pooping (constipation).  Have weakness and tiredness (fatigue).  Feel sad (depressed) or grouchy (irritable).  Have pain or discomfort around the cuts from surgery (incisions). Follow these instructions at home: Medicines  Take over-the-counter and prescription medicines only as told by your doctor. Do not stop taking medicines or start any new medicines unless your doctor says it is okay.  If you were prescribed an antibiotic medicine, take it as told by your doctor. Do not stop taking the antibiotic even if you start to feel better. Incision care   Follow instructions from your doctor about how to take care of your cuts from surgery. Make sure you: ? Wash your hands with soap and water before and after you change your bandage (dressing). If you cannot use soap and water, use hand sanitizer. ? Change your bandage as told by your doctor. ? Leave stitches (sutures), skin glue, or skin tape (adhesive) strips in place. They may need to stay in place for 2 weeks or longer. If tape strips get loose and curl up, you may trim the loose edges. Do not remove tape strips completely unless your doctor says it is okay.  Make sure the surgery cuts are clean, dry, and protected.  Check your cut areas every day for signs of infection. Check for: ? More redness, swelling, or pain. ? More fluid or blood. ? Warmth. ? Pus or a bad smell.  If cuts were made in your legs: ? Avoid crossing your legs. ? Avoid sitting for long periods of time. Change positions every 30 minutes. ? Raise (elevate) your legs when you are sitting. Bathing  Do not take baths, swim, or use a hot tub until your doctor says it is okay.  You may shower. Pat the surgery cuts dry. Do not rub the cuts to dry.  Eating and drinking   Eat foods that are high in fiber, such as beans, nuts, whole grains, and raw fruits and vegetables. Any meats you eat should be lean cut. Avoid canned,  processed, and fried foods. This can help prevent trouble pooping. This is also a part of a heart-healthy diet.  Drink enough fluid to keep your pee (urine) pale yellow.  Do not drink alcohol until you are fully recovered. Ask your doctor when it is safe to drink alcohol. Activity  Rest and limit your activity as told by your doctor. You may be told to: ? Stop any activity right away if you have chest pain, shortness of breath, irregular heartbeats, or dizziness. Get help right away if you have any of these symptoms. ? Move around often for short periods or take short walks as told by your doctor. Slowly increase your activities. ? Avoid lifting, pushing, or pulling anything that is heavier than 10 lb (4.5 kg) for at least 6 weeks or as told by your doctor.  Do physical therapy or a cardiac rehab (cardiac rehabilitation) program as told by your doctor. ? Physical therapy involves doing exercises to maintain movement and build strength and endurance. ? A cardiac rehab   program includes:  Exercise training.  Education.  Counseling.  Do not drive until your doctor says it is okay.  Ask your doctor when you can go back to work.  Ask your doctor when you can be sexually active. General instructions  Do not drive or use heavy machinery while taking prescription pain medicine.  Do not use any products that contain nicotine or tobacco. These include cigarettes, e-cigarettes, and chewing tobacco. If you need help quitting, ask your doctor.  Take 2-3 deep breaths every few hours during the day while you get better. This helps expand your lungs and prevent problems.  If you were given a device called an incentive spirometer, use it several times a day to practice deep breathing. Support your chest with a pillow or your arms when you take deep breaths or cough.  Wear compression stockings as told by your doctor.  Weigh yourself every day. This helps to see if your body is holding  (retaining) fluid that may make your heart and lungs work harder.  Keep all follow-up visits as told by your doctor. This is important. Contact a doctor if:  You have more redness, swelling, or pain around any cut.  You have more fluid or blood coming from any cut.  Any cut feels warm to the touch.  You have pus or a bad smell coming from any cut.  You have a fever.  You have swelling in your ankles or legs.  You have pain in your legs.  You gain 2 lb (0.9 kg) or more a day.  You feel sick to your stomach or you throw up (vomit).  You have watery poop (diarrhea). Get help right away if:  You have chest pain that goes to your jaw or arms.  You are short of breath.  You have a fast or irregular heartbeat.  You notice a "clicking" in your breastbone (sternum) when you move.  You have any signs of a stroke. "BE FAST" is an easy way to remember the main warning signs: ? B - Balance. Signs are dizziness, sudden trouble walking, or loss of balance. ? E - Eyes. Signs are trouble seeing or a change in how you see. ? F - Face. Signs are sudden weakness or loss of feeling of the face, or the face or eyelid drooping on one side. ? A - Arms. Signs are weakness or loss of feeling in an arm. This happens suddenly and usually on one side of the body. ? S - Speech. Signs are sudden trouble speaking, slurred speech, or trouble understanding what people say. ? T - Time. Time to call emergency services. Write down what time symptoms started.  You have other signs of a stroke, such as: ? A sudden, very bad headache with no known cause. ? Feeling sick to your stomach. ? Throwing up. ? Jerky movements you cannot control (seizure). These symptoms may be an emergency. Do not wait to see if the symptoms will go away. Get medical help right away. Call your local emergency services (911 in the [place] Do not drive yourself to the hospital. Summary  After the procedure, it is common to have pain  or discomfort in the cuts from surgery (incisions).  Do not take baths, swim, or use a hot tub until your doctor says it is okay.  Slowly increase your activities. You may need physical therapy or cardiac rehab.  Weigh yourself every day. This helps to see if your body is holding fluid. This   information is not intended to replace advice given to you by your health care provider. Make sure you discuss any questions you have with your health care provider. Document Revised: 04/30/2018 Document Reviewed: 04/30/2018 Elsevier Patient Education  2020 [employer]  

## 2020-01-30 NOTE — TOC Initial Note (Addendum)
Transition of Care [facility]) - Initial/Assessment Note    Patient Details  Name: Mark Foster MRN: [id number] Date of Birth: 1955-10-13  Transition of Care [facility]) CM/SW Contact:    Verdell Carmine, RN Phone Number: 01/30/2020, 12:31 PM  Clinical Narrative:                 Patient  Is  post operative CABG, PT recommending rolling walker and Pt at home. MD also recommending RN at home for a couple of visits to assist with organization with medications.  Up until the last couple of years, he has been very active running, plays guitar in church. His goals are to play guitar in church within the next 6 weeks. He is amiable to having home health and rolling walker.He is not particular about which agency,  He has not had home health before. Dicussed HH choices patient states whomever is best with insurance. , Rolling walker ordered through [facility]. Will update patient on home health agency acceptance. Discharge tentatively planned for tomorrow.  Addendum: Patient updated that [facility] has accepted for Home health. Rollator in his room from [facility]  Expected Discharge Plan: [place] Barriers to Discharge: Continued Medical Work up   Patient Goals and CMS Choice Patient states their goals for this hospitalization and ongoing recovery are:: TO go home and have help CMS Medicare.gov Compare Post Acute Care list provided to:: Patient Choice offered to / list presented to : Patient  Expected Discharge Plan and Services Expected Discharge Plan: [place]   Discharge Planning Services: CM Consult Post Acute Care Choice: [place] arrangements for the past 2 months: Apartment                 DME Arranged: Walker rolling DME Agency: [employer] Date DME Agency Contacted: 01/30/20 Time DME Agency Contacted: 1220 Representative spoke with at DME Agency: Andree Coss            Prior Living Arrangements/Services Living arrangements for the past 2  months: [place] with:: Self Patient language and need for interpreter reviewed:: Yes Do you feel safe going back to the place where you live?: Yes      Need for Family Participation in Patient Care: Yes (Comment) Care giver support system in place?: Yes (comment)   Criminal Activity/Legal Involvement Pertinent to Current Situation/Hospitalization: No - Comment as needed  Activities of Daily Living Home Assistive Devices/Equipment: CBG Meter, Dentures (specify type), Eyeglasses, Cane (specify quad or straight) ADL Screening (condition at time of admission) Patient's cognitive ability adequate to safely complete daily activities?: Yes Is the patient deaf or have difficulty hearing?: No Does the patient have difficulty seeing, even when wearing glasses/contacts?: No Does the patient have difficulty concentrating, remembering, or making decisions?: No Patient able to express need for assistance with ADLs?: Yes Does the patient have difficulty dressing or bathing?: No Independently performs ADLs?: No Communication: Independent Dressing (OT): Needs assistance Is this a change from baseline?: Change from baseline, expected to last <3days Grooming: Independent Feeding: Independent Bathing: Needs assistance Is this a change from baseline?: Change from baseline, expected to last <3 days Toileting: Needs assistance Is this a change from baseline?: Change from baseline, expected to last <3 days In/Out Bed: Needs assistance Is this a change from baseline?: Change from baseline, expected to last <3 days Walks in Home: Needs assistance Is this a change from baseline?: Change from baseline, expected to last <3 days Does the patient  have difficulty walking or climbing stairs?: No Weakness of Legs: Both(right side slightly weaker) Weakness of Arms/Hands: None  Permission Sought/Granted Permission sought to share information with : Case Manager       Permission granted to share info w  AGENCY: Home health [facility]        Emotional Assessment Appearance:: Appears stated age Attitude/Demeanor/Rapport: Gracious Affect (typically observed): Calm Orientation: : Oriented to Self, Oriented to Place, Oriented to  Time, Oriented to Situation Alcohol / Substance Use: Tobacco Use Psych Involvement: No (comment)  Admission diagnosis:  S/P CABG x 4 [Z95.1] Patient Active Problem List   Diagnosis Date Noted  . 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 Abnormal cardiac CT angiography 01/08/2020  . Angina pectoris ([place])   . CVA (cerebral vascular accident) ([place]) 10/08/2019  . Injury of left ankle 03/24/2019  . Port-A-Cath in place 12/19/2018  . Genetic testing 11/15/2018  . Loss of consciousness ([place]) 11/12/2018  . Generalized weakness 11/11/2018  . Family history of pancreatic cancer   . Hyperglycemia 10/09/2018  . Tobacco abuse counseling 10/09/2018  . Pancreatic adenocarcinoma ([place]) 09/03/2018  . H/O ETOH abuse 09/03/2018  . Hyperbilirubinemia 09/03/2018  . 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 Uncontrolled type 2 diabetes mellitus with hyperglycemia ([place]) 03/05/2018  . Hypothyroidism 03/05/2018  . Intracranial atherosclerosis 01/22/2018  . Spinal stenosis in cervical region   . Diabetes mellitus type 2 in nonobese (HCC)   . Left carotid artery occlusion 01/21/2018  . Cervical stenosis of spinal canal 01/21/2018  . Right hand weakness 01/21/2018  . Numbness of right hand 01/21/2018  . Essential hypertension, benign 01/21/2018  . Mixed hyperlipidemia 01/21/2018  . Diabetes ([place]) 01/21/2018  . Hx of completed stroke 01/21/2018  . Leukopenia 01/21/2018  . Thrombocytopenia ([place]) 01/21/2018  . L MCA Stroke-like episode ([place]) s/p IV tPA 01/19/2018   PCP:  Ailene Ards, NP Pharmacy:   [facility], [place] [address] AT [place]. [place] [address] Phone:  [phone] Fax: [phone]  [facility] - [place], [place] [address] Phone: [phone] Fax: [phone]  [facility] [place] [place] - [address] [place] [address] Phone: [phone] Fax: [phone]     Social Determinants of Health (SDOH) Interventions    Readmission Risk Interventions No flowsheet data found.

## 2020-01-30 NOTE — Evaluation (Signed)
Physical Therapy Evaluation Patient Details Name: Mark Foster MRN: [id number] DOB: 01-Sep-1956 Today's Date: 01/30/2020   History of Present Illness  Patient is a 64 y/o male who presents s/p CABG x4 5/24. 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  Clinical Impression  Patient presents with generalized weakness, pain, impaired balance and decreased activity tolerance s/p above. Pt lives alone and reports using cane for community ambulation PTA. Today, pt requires Mod A for standing and Min A for bed mobility. Tolerated gait training with Min guard assist and use of RW for support. Hr up to 121 bpm with activity and difficult getting a pleth reading for Sp02. Education re: "move in the tube" and sternal precautions, which pt needs reinforcements with throughout mobility. Will follow acutely to maximize independence and mobility prior to return home. Recommend initial supervision from family if able.    Follow Up Recommendations Home health PT;Supervision for mobility/OOB    Equipment Recommendations  Rolling walker with 5" wheels    Recommendations for Other Services       Precautions / Restrictions Precautions Precautions: Fall;Sternal Precaution Booklet Issued: No Precaution Comments: Verbally reviewed "move in the tube" Restrictions Weight Bearing Restrictions: Yes(sternal precautions)      Mobility  Bed Mobility Overal bed mobility: Needs Assistance Bed Mobility: Rolling;Sidelying to Sit Rolling: Min guard Sidelying to sit: Min assist;HOB elevated       General bed mobility comments: Increased time, holding heart pillow, able to roll onto side and assist needed with trunk.  Transfers Overall transfer level: Needs assistance Equipment used: Rolling walker (2 wheeled) Transfers: Sit to/from Stand Sit to Stand: Mod assist;From elevated surface         General transfer comment:  Simulating bed height at home; cues to use momentum to stand, hands on knees as pt wanting to pull up on RW. Transferred tochair post ambulation.  Ambulation/Gait Ambulation/Gait assistance: Min guard Gait Distance (Feet): 470 Feet Assistive device: Rolling walker (2 wheeled) Gait Pattern/deviations: Step-through pattern;Decreased stride length Gait velocity: decreased Gait velocity interpretation: <1.31 ft/sec, indicative of household ambulator General Gait Details: Slow, steady gait with RW for support; 2 standing rest breaks. 2/4 DOE. HR 121 bpm. Difficult getting good pleth reading for 02  Stairs            Wheelchair Mobility    Modified Rankin (Stroke Patients Only)       Balance Overall balance assessment: Mild deficits observed, not formally tested                                           Pertinent Vitals/Pain Pain Assessment: 0-10 Pain Score: 3  Pain Location: chest incision Pain Descriptors / Indicators: Sore;Operative site guarding Pain Intervention(s): Repositioned;Monitored during session;Premedicated before session    Home Living Family/patient expects to be discharged to:: Private residence Living Arrangements: Alone Available Help at Discharge: Other (Comment)(minimal help) Type of Home: House Home Access: Stairs to enter Entrance Stairs-Rails: Right Entrance Stairs-Number of Steps: 3 Home Layout: One level Home Equipment: Latina Craver - 2 wheels      Prior Function Level of Independence: Independent with assistive device(s)         Comments: community ambulator with quad cane, drives, plays guitar     Hand Dominance   Dominant Hand: Right    Extremity/Trunk Assessment  Upper Extremity Assessment Upper Extremity Assessment: Defer to OT evaluation    Lower Extremity Assessment Lower Extremity Assessment: Generalized weakness;LLE deficits/detail;RLE deficits/detail RLE Sensation: history of peripheral  neuropathy LLE Sensation: history of peripheral neuropathy       Communication   Communication: No difficulties  Cognition Arousal/Alertness: Awake/alert Behavior During Therapy: WFL for tasks assessed/performed Overall Cognitive Status: Impaired/Different from baseline Area of Impairment: Memory                     Memory: Decreased recall of precautions         General Comments: Able to state no pushing/pulling with UEs but always trying to use UEs for all functioanl mobility tasks and needing cues to adhere to precautions.      General Comments      Exercises     Assessment/Plan    PT Assessment Patient needs continued PT services  PT Problem List Decreased strength;Decreased mobility;Pain;Impaired sensation;Decreased balance;Decreased activity tolerance;Cardiopulmonary status limiting activity;Decreased skin integrity       PT Treatment Interventions Therapeutic activities;Gait training;Therapeutic exercise;Patient/family education;Functional mobility training;Wheelchair mobility training;Balance training    PT Goals (Current goals can be found in the Care Plan section)  Acute Rehab PT Goals Patient Stated Goal: to get back to independence PT Goal Formulation: With patient Time For Goal Achievement: 02/13/20 Potential to Achieve Goals: Good    Frequency Min 3X/week   Barriers to discharge Decreased caregiver support      Co-evaluation               AM-PAC PT "6 Clicks" Mobility  Outcome Measure Help needed turning from your back to your side while in a flat bed without using bedrails?: A Little Help needed moving from lying on your back to sitting on the side of a flat bed without using bedrails?: A Little Help needed moving to and from a bed to a chair (including a wheelchair)?: A Lot Help needed standing up from a chair using your arms (e.g., wheelchair or bedside chair)?: A Lot Help needed to walk in hospital room?: A Little Help needed  climbing 3-5 steps with a railing? : A Little 6 Click Score: 16    End of Session Equipment Utilized During Treatment: Gait belt Activity Tolerance: Patient tolerated treatment well Patient left: in chair;with call bell/phone within reach;Other (comment)(MD in room) Nurse Communication: Mobility status PT Visit Diagnosis: Pain;Difficulty in walking, not elsewhere classified (R26.2);Muscle weakness (generalized) (M62.81) Pain - part of body: (incision)    Time: [id number] PT Time Calculation (min) (ACUTE ONLY): 37 min   Charges:   PT Evaluation $PT Eval Moderate Complexity: 1 Mod          Marisa Severin, PT, DPT Acute Rehabilitation Services Pager [phone] Office [phone]      Marguarite Arbour A Sabra Heck 01/30/2020, 10:41 AM

## 2020-01-30 NOTE — Progress Notes (Signed)
Mobility Specialist: Progress Note    01/30/20 1526  Mobility  Activity Ambulated in hall  Level of Assistance Independent  Assistive Device None  Distance Ambulated (ft) 510 ft  Mobility Response Tolerated well  Mobility performed by Mobility specialist  Bed Position Chair  $Mobility charge 1 Mobility   Pt tolerated ambulation well. Pt has walked several times today and plans to go for another walk later this evening. Pt was positioned in chair with call bell and phone within reach.   [facility] Keldrick Pomplun Mobility Specialist

## 2020-01-30 NOTE — Progress Notes (Signed)
Inpatient Diabetes Program Recommendations  AACE/ADA: New Consensus Statement on Inpatient Glycemic Control (2015)  Target Ranges:  Prepandial:   less than 140 mg/dL      Peak postprandial:   less than 180 mg/dL (1-2 hours)      Critically ill patients:  140 - 180 mg/dL   Lab Results  Component Value Date   GLUCAP 254 (H) 01/30/2020   HGBA1C 9.4 (H) 01/22/2020    Review of Glycemic Control  Diabetes history: DM 2, Sees Dr. Dorris Fetch, Endocrinologist, [place], [place] Outpatient Diabetes medications: Lantus 36 + [facility] 6[facility]10 [facility] Current orders for Inpatient glycemic control:  Levemir 20 units bid Novolog 0-24 units tid and hs Novolog 6 units tid meal coverage  A1c 9.4% down from February this year when it was 12.1%  Spoke with pt and wife at bedside regarding A1c and glucose control at home. Pt will work closely with his Endocrinologist to improve glucose trends while avoiding hypoglycemia. Discussed glucose goals.  Thanks,  Tama Headings RN, MSN, BC-ADM Inpatient Diabetes Coordinator Team Pager [phone] (8a-5p)

## 2020-01-30 NOTE — Progress Notes (Signed)
4 Days Post-Op Procedure(s) (LRB): CORONARY ARTERY BYPASS GRAFTING (CABG) TIMES FOUR USING LEFT INTERNAL MAMMARY VEIN AND RIGHT GREATER SAPHENOUS VEIN (N/A) TRANSESOPHAGEAL ECHOCARDIOGRAM (TEE) (N/A) Endovein Harvest Of Greater Saphenous Vein (Right) Subjective: A lot of pain when he woke up this AM, better now Just back from a walk  Objective: Vital signs in last 24 hours: Temp:  [97.7 F (36.5 C)-98.2 F (36.8 C)] 98.1 F (36.7 C) (05/28 0039) Pulse Rate:  [80-101] 94 (05/28 0514) Cardiac Rhythm: Normal sinus rhythm (05/27 2020) Resp:  [12-18] 18 (05/28 0514) BP: (103-123)/(64-95) 110/70 (05/28 0039) SpO2:  [90 %-100 %] 96 % (05/28 0514) Weight:  [78.8 kg] 78.8 kg (05/28 0514)  Hemodynamic parameters for last 24 hours:    Intake/Output from previous day: 05/27 0701 - 05/28 0700 In: 480 [P.O.:480] Out: 750 [Urine:750] Intake/Output this shift: No intake/output data recorded.  General appearance: alert, cooperative and no distress Neurologic: intact Heart: regular rate and rhythm Lungs: diminished breath sounds bibasilar Wound: clean and dry  Lab Results: Recent Labs    01/28/20 0423 01/29/20 0253  WBC 8.0 7.1  HGB 11.7* 11.2*  HCT 34.2* 32.8*  PLT 110* 130*   BMET:  Recent Labs    01/28/20 0423 01/29/20 0253  NA 135 136  K 4.0 3.7  CL 105 104  CO2 24 27  GLUCOSE 230* 78  BUN 25* 22  CREATININE 0.80 0.81  CALCIUM 8.1* 8.1*    PT/INR: No results for input(s): LABPROT, INR in the last 72 hours. ABG    Component Value Date/Time   PHART 7.314 (L) 01/26/2020 2151   HCO3 23.1 01/26/2020 2151   TCO2 24 01/26/2020 2151   ACIDBASEDEF 3.0 (H) 01/26/2020 2151   O2SAT 96.0 01/26/2020 2151   CBG (last 3)  Recent Labs    01/30/20 0602 01/30/20 0624 01/30/20 0704  GLUCAP 66* 61* 118*    Assessment/Plan: S/P Procedure(s) (LRB): CORONARY ARTERY BYPASS GRAFTING (CABG) TIMES FOUR USING LEFT INTERNAL MAMMARY VEIN AND RIGHT GREATER SAPHENOUS VEIN  (N/A) TRANSESOPHAGEAL ECHOCARDIOGRAM (TEE) (N/A) Endovein Harvest Of Greater Saphenous Vein (Right) Tachycardic with walk, hasn't had lopressor yet this morning. Continue BID lopressor Hypoglycemic this AM, will decrease levemir and continue with higher meal coverage dose He lives alone but sister and girlfriend can help. I think he needs another day before going home. Dc pacing wires   LOS: 4 days    Melrose Nakayama 01/30/2020

## 2020-01-30 NOTE — Progress Notes (Signed)
1428  Returned to ambulate with patient and he is currently getting ready to ambulate with mobility specialist.   Lesly Rubenstein MS, ACSM-EP-C, CCRP

## 2020-01-30 NOTE — Evaluation (Signed)
Occupational Therapy Evaluation Patient Details Name: Mark Foster MRN: [id number] DOB: 08-13-1956 Today's Date: 01/30/2020    History of Present Illness Patient is a 64 y/o male who presents s/p CABG x4 5/24. 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   Clinical Impression   PTA Pt independent in ADL and mobility (used SPC PRN), enjoys playing guitar at church. Pt is mod A for sit<>stand transfers and needs cues for sternal precautions during transfers and also during standing grooming, toileting for sternal precautions. Focused heavily on "move in the tube" terminology and functional emphasis during standing grooming tasks. Pt is mod A for LB ADL. Pt will benefit from skilled OT in the acute setting and afterwards at the [facility] level to maximize safety and independence in ADL and functional transfers. PLEASE take sternal precautions handout next session. Focus on toileting and transfers.     Follow Up Recommendations  Home health OT;Supervision/Assistance - 24 hour(initially)    Equipment Recommendations  3 in 1 bedside commode(although friend is bringing him a shower chair)    Recommendations for Other Services       Precautions / Restrictions Precautions Precautions: Fall;Sternal Precaution Booklet Issued: No Precaution Comments: Verbally reviewed "move in the tube" Restrictions Weight Bearing Restrictions: Yes(Sternal)      Mobility Bed Mobility Overal bed mobility: Needs Assistance Bed Mobility: Rolling;Sidelying to Sit Rolling: Min guard Sidelying to sit: Min assist;HOB elevated       General bed mobility comments: OOB in recliner at beginning and end of session  Transfers Overall transfer level: Needs assistance Equipment used: Rolling walker (2 wheeled) Transfers: Sit to/from Stand Sit to Stand: Mod assist         General transfer comment: educated again on hands on knees for  push off- and not arm rests of chair for sternal precautions    Balance Overall balance assessment: Mild deficits observed, not formally tested                                         ADL either performed or assessed with clinical judgement   ADL Overall ADL's : Needs assistance/impaired Eating/Feeding: Set up;Sitting   Grooming: Min guard;Wash/dry hands;Wash/dry face;Oral care;Standing Grooming Details (indicate cue type and reason): educated on sternal precautions during grooming tasks - required muitiple cues Upper Body Bathing: Moderate assistance;Sitting   Lower Body Bathing: Moderate assistance   Upper Body Dressing : Moderate assistance;Cueing for compensatory techniques   Lower Body Dressing: Moderate assistance;Sit to/from stand Lower Body Dressing Details (indicate cue type and reason): to manage clothing and sit<>stand Toilet Transfer: Moderate assistance;Ambulation;RW Toilet Transfer Details (indicate cue type and reason): mod A for boost into standing, min guard assist once upright Toileting- Clothing Manipulation and Hygiene: Maximal assistance;Sit to/from stand Toileting - Clothing Manipulation Details (indicate cue type and reason): educated on compensatory strategies for wiping     Functional mobility during ADLs: Min guard;Minimal assistance;Rolling walker;Cueing for safety General ADL Comments: multimodal cues for sternal precautions throughout session     Vision Baseline Vision/History: Wears glasses Wears Glasses: At all times Patient Visual Report: No change from baseline Vision Assessment?: No apparent visual deficits     Perception     Praxis      Pertinent Vitals/Pain Pain Assessment: 0-10 Pain Score: 3  Pain Location: chest incision Pain Descriptors / Indicators: Sore;Operative site  guarding Pain Intervention(s): Monitored during session;Repositioned;Patient requesting pain meds-RN notified     Hand Dominance Right    Extremity/Trunk Assessment Upper Extremity Assessment Upper Extremity Assessment: Generalized weakness(limited assessment due to sternal precautions)   Lower Extremity Assessment Lower Extremity Assessment: Defer to PT evaluation RLE Sensation: history of peripheral neuropathy LLE Sensation: history of peripheral neuropathy       Communication Communication Communication: No difficulties   Cognition Arousal/Alertness: Awake/alert Behavior During Therapy: WFL for tasks assessed/performed Overall Cognitive Status: Impaired/Different from baseline Area of Impairment: Safety/judgement;Memory                     Memory: Decreased recall of precautions   Safety/Judgement: Decreased awareness of safety     General Comments: cues for sternal precautions throughout session   General Comments  friend Joycelyn Schmid present throughout session - received education for compensatory strategies, sternal precautions with special emphasis on ADL    Exercises     Shoulder Instructions      Home Living Family/patient expects to be discharged to:: Private residence Living Arrangements: Alone Available Help at Discharge: Other (Comment)(minimal help) Type of Home: House Home Access: Stairs to enter [employer] of Steps: 3 Entrance Stairs-Rails: Right Home Layout: One level     Bathroom Shower/Tub: Occupational psychologist: Standard Bathroom Accessibility: Yes How Accessible: Accessible via walker Home Equipment: Latina Craver - 2 wheels          Prior Functioning/Environment Level of Independence: Independent with assistive device(s)        Comments: community ambulator with quad cane, drives, plays guitar        OT Problem List: Decreased activity tolerance;Decreased range of motion;Impaired balance (sitting and/or standing);Decreased cognition;Decreased safety awareness;Decreased knowledge of precautions;Decreased knowledge of use of DME or  AE;Cardiopulmonary status limiting activity;Impaired UE functional use;Pain      OT Treatment/Interventions: Self-care/ADL training;DME and/or AE instruction;Therapeutic activities;Cognitive remediation/compensation;Patient/family education;Balance training    OT Goals(Current goals can be found in the care plan section) Acute Rehab OT Goals Patient Stated Goal: be able to play guitar in church again OT Goal Formulation: With patient/family Time For Goal Achievement: 02/13/20 Potential to Achieve Goals: Good  OT Frequency: Min 3X/week   Barriers to D/C: Decreased caregiver support          Co-evaluation              AM-PAC OT "6 Clicks" Daily Activity     Outcome Measure Help from another person eating meals?: A Little Help from another person taking care of personal grooming?: A Little Help from another person toileting, which includes using toliet, bedpan, or urinal?: A Little Help from another person bathing (including washing, rinsing, drying)?: A Little Help from another person to put on and taking off regular upper body clothing?: A Lot Help from another person to put on and taking off regular lower body clothing?: A Lot 6 Click Score: 16   End of Session Equipment Utilized During Treatment: Gait belt;Rolling walker Nurse Communication: Mobility status;Precautions;Patient requests pain meds  Activity Tolerance: Patient tolerated treatment well Patient left: in chair;with call bell/phone within reach;with chair alarm set;with family/visitor present  OT Visit Diagnosis: Unsteadiness on feet (R26.81);Muscle weakness (generalized) (M62.81);Other symptoms and signs involving cognitive function;Pain Pain - Right/Left: (central) Pain - part of body: (Sternum)                Time: [id number] OT Time Calculation (min): 24 min Charges:  OT General Charges $  OT Visit: 1 Visit OT Evaluation $OT Eval Moderate Complexity: 1 Mod OT Treatments $Self Care/Home Management : 8-22  mins  Jesse Sans OTR/L Acute Rehabilitation Services Pager: [phone] Office: [place] 01/30/2020, 1:14 PM

## 2020-01-31 LAB — GLUCOSE, CAPILLARY
Glucose-Capillary: 97 mg/dL (ref 70–99)
Glucose-Capillary: 152 mg/dL — ABNORMAL HIGH (ref 70–99)

## 2020-01-31 MED ORDER — OXYCODONE HCL 5 MG PO TABS: 5.0000 mg | ORAL_TABLET | Freq: Four times a day (QID) | ORAL | 0 refills | Status: DC | PRN

## 2020-01-31 MED ORDER — ASPIRIN 81 MG PO TBEC: 81.0000 mg | DELAYED_RELEASE_TABLET | Freq: Every day | ORAL | Status: DC

## 2020-01-31 MED ORDER — ACETAMINOPHEN 500 MG PO TABS: 1000.0000 mg | ORAL_TABLET | Freq: Four times a day (QID) | ORAL | 0 refills | Status: DC

## 2020-01-31 NOTE — Progress Notes (Signed)
      [address]       [place] [address]             [phone]      5 Days Post-Op Procedure(s) (LRB): CORONARY ARTERY BYPASS GRAFTING (CABG) TIMES FOUR USING LEFT INTERNAL MAMMARY VEIN AND RIGHT GREATER SAPHENOUS VEIN (N/A) TRANSESOPHAGEAL ECHOCARDIOGRAM (TEE) (N/A) Endovein Harvest Of Greater Saphenous Vein (Right) Subjective: Cold this morning with several blankets on him.   Objective: Vital signs in last 24 hours: Temp:  [97.8 F (36.6 C)-99.6 F (37.6 C)] 99 F (37.2 C) (05/29 0505) Pulse Rate:  [91-106] 91 (05/29 0505) Cardiac Rhythm: Normal sinus rhythm (05/29 0505) Resp:  [14-19] 16 (05/29 0511) BP: (85-131)/(54-80) 99/63 (05/29 0511) SpO2:  [93 %-98 %] 94 % (05/29 0505) Weight:  [79.4 kg] 79.4 kg (05/29 0511)     Intake/Output from previous day: 05/28 0701 - 05/29 0700 In: 480 [P.O.:480] Out: 1550 [Urine:1550] Intake/Output this shift: No intake/output data recorded.  General appearance: alert, cooperative and no distress Heart: regular rate and rhythm, S1, S2 normal, no murmur, click, rub or gallop Lungs: clear to auscultation bilaterally Abdomen: soft, non-tender; bowel sounds normal; no masses,  no organomegaly Extremities: extremities normal, atraumatic, no cyanosis or edema Wound: clean and dry  Lab Results: Recent Labs    01/29/20 0253  WBC 7.1  HGB 11.2*  HCT 32.8*  PLT 130*   BMET:  Recent Labs    01/29/20 0253  NA 136  K 3.7  CL 104  CO2 27  GLUCOSE 78  BUN 22  CREATININE 0.81  CALCIUM 8.1*    PT/INR: No results for input(s): LABPROT, INR in the last 72 hours. ABG    Component Value Date/Time   PHART 7.314 (L) 01/26/2020 2151   HCO3 23.1 01/26/2020 2151   TCO2 24 01/26/2020 2151   ACIDBASEDEF 3.0 (H) 01/26/2020 2151   O2SAT 96.0 01/26/2020 2151   CBG (last 3)  Recent Labs    01/30/20 1608 01/30/20 2121 01/31/20 0627  GLUCAP 186* 89 97    Assessment/Plan: S/P Procedure(s) (LRB): CORONARY  ARTERY BYPASS GRAFTING (CABG) TIMES FOUR USING LEFT INTERNAL MAMMARY VEIN AND RIGHT GREATER SAPHENOUS VEIN (N/A) TRANSESOPHAGEAL ECHOCARDIOGRAM (TEE) (N/A) Endovein Harvest Of Greater Saphenous Vein (Right)  1. One episode of hypotension. NSR in the 80s. Continue asa, statin, and BB 2. Pulm-tolerating room air with good oxygen saturation.  3. Renal-creatinine 0.81, electrolytes okay 4. H and H 11.2/32.8, stable 5. Endo- mostly well controlled 6. EPW removed yesterday   Plan: Wants to go home today but unsure if he has a ride that can come get him. Otherwise he feels good and is progressing well.    LOS: 5 days    Elgie Collard 01/31/2020

## 2020-01-31 NOTE — Progress Notes (Signed)
Occupational Therapy Treatment Patient Details Name: Mark Foster MRN: [id number] DOB: December 05, 1955 Today's Date: 01/31/2020    History of present illness Patient is a 64 y/o male who presents s/p CABG x4 5/24. 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   OT comments  Patient continues to make steady progress towards goals in skilled OT session. Patient's session encompassed increased education with regard to sternal precautions with handout provided. Pt practiced transfers OOB and on toilet to promote demonstration and follow through with education, requiring min verbal cues in order to complete appropriately. Upon standing, pt noted to place hands on knees with each transfer as taught in last OT session. Pt then able complete functional mobility to complete household distances with rollator with no need for seated rest breaks. Discharge remains appropriate at this time; will continue to follow acutely.    Follow Up Recommendations  Home health OT;Supervision/Assistance - 24 hour(Initially)    Equipment Recommendations  3 in 1 bedside commode    Recommendations for Other Services      Precautions / Restrictions Precautions Precautions: Fall;Sternal Precaution Booklet Issued: Yes (comment) Precaution Comments: Verbally reviewed, demonstrated with min cues, and handout provided       Mobility Bed Mobility Overal bed mobility: Modified Independent Bed Mobility: Sidelying to Sit   Sidelying to sit: Modified independent (Device/Increase time);HOB elevated       General bed mobility comments: Increased time needed but able to complete  Transfers Overall transfer level: Needs assistance Equipment used: 4-wheeled walker Transfers: Sit to/from Stand Sit to Stand: Min guard         General transfer comment: Pt required increased attempts (rocking forward and hands on knees) but able to complete  without external assist from therapist    Balance                                           ADL either performed or assessed with clinical judgement   ADL Overall ADL's : Needs assistance/impaired                         Toilet Transfer: Ambulation;RW;Min guard Armed forces technical officer Details (indicate cue type and reason): Able to adhere to sternal precautions and rock forward to get to upright position         Functional mobility during ADLs: Min guard;Rolling walker;Cueing for safety General ADL Comments: significant increase in ability to complete sternal precautions in session     Vision       Perception     Praxis      Cognition Arousal/Alertness: Awake/alert Behavior During Therapy: WFL for tasks assessed/performed Overall Cognitive Status: Within Functional Limits for tasks assessed                                 General Comments: Significant improvement to adhere to sternal precautions        Exercises     Shoulder Instructions       General Comments Continued education on sternal precautions    Pertinent Vitals/ Pain       Pain Assessment: 0-10 Pain Score: 6  Pain Location: chest incision Pain Descriptors / Indicators: Sore;Operative site guarding Pain Intervention(s): Limited activity within patient's tolerance;Monitored during session;Repositioned  Home Living                                          Prior Functioning/Environment              Frequency  Min 3X/week        Progress Toward Goals  OT Goals(current goals can now be found in the care plan section)  Progress towards OT goals: Progressing toward goals  Acute Rehab OT Goals Patient Stated Goal: be able to play guitar in church again OT Goal Formulation: With patient Time For Goal Achievement: 02/13/20 Potential to Achieve Goals: Good  Plan Discharge plan remains appropriate    Co-evaluation                  AM-PAC OT "6 Clicks" Daily Activity     Outcome Measure   Help from another person eating meals?: A Little Help from another person taking care of personal grooming?: A Little Help from another person toileting, which includes using toliet, bedpan, or urinal?: A Little Help from another person bathing (including washing, rinsing, drying)?: A Little Help from another person to put on and taking off regular upper body clothing?: A Lot Help from another person to put on and taking off regular lower body clothing?: A Lot 6 Click Score: 16    End of Session Equipment Utilized During Treatment: Other (comment)(Rollator)  OT Visit Diagnosis: Unsteadiness on feet (R26.81);Muscle weakness (generalized) (M62.81);Other symptoms and signs involving cognitive function;Pain Pain - Right/Left: (Central) Pain - part of body: (Sternum)   Activity Tolerance Patient tolerated treatment well   Patient Left in chair;with call bell/phone within reach   Nurse Communication Mobility status;Precautions        Time: [id number] OT Time Calculation (min): 38 min  Charges: OT General Charges $OT Visit: 1 Visit OT Treatments $Self Care/Home Management : 38-52 mins  [place]. [place], [place] Acute Rehabilitation Services [place] 01/31/2020, 1:26 PM

## 2020-01-31 NOTE — Progress Notes (Signed)
Discharged to home with family office visits in place teaching done  

## 2020-01-31 NOTE — TOC Transition Note (Signed)
Transition of Care [facility]) - CM/SW Discharge Note   Patient Details  Name: Sequan Kuo MRN: [id number] Date of Birth: Jan 23, 1956  [facility] CM/SW Contact:  Verdell Carmine, RN Phone Number: 01/31/2020, 7:54 AM   Clinical Narrative:     Patient has home health RN and PT set up with [facility], Rolator in the room for home use.   Final next level of care: [place] Barriers to Discharge: No Barriers Identified   Patient Goals and CMS Choice Patient states their goals for this hospitalization and ongoing recovery are:: TO go home and have help CMS Medicare.gov Compare Post Acute Care list provided to:: Patient Choice offered to / list presented to : Patient  Discharge Placement             Home with home Health          Discharge Plan and Services   Discharge Planning Services: CM Consult Post Acute Care Choice: Home Health          DME Arranged: Walker rolling DME Agency: [employer] Date DME Agency Contacted: 01/30/20 Time DME Agency Contacted: 1220 Representative spoke with at DME Agency: Andree Coss HH Arranged: RN, PT [place] Agency: [facility] (formerly [employer]) Date [place]: 01/30/20 Time [place]: 1247 Representative spoke with at [place]: [place] ([place]) Interventions     Readmission Risk Interventions No flowsheet data found.

## 2020-02-01 ENCOUNTER — Emergency Department (HOSPITAL_COMMUNITY): Payer: Medicare HMO

## 2020-02-01 ENCOUNTER — Emergency Department (HOSPITAL_COMMUNITY): Admit: 2020-02-01 | Payer: Medicare HMO

## 2020-02-01 ENCOUNTER — Encounter (HOSPITAL_COMMUNITY): Payer: Self-pay | Admitting: Emergency Medicine

## 2020-02-01 ENCOUNTER — Emergency Department (HOSPITAL_COMMUNITY)
Admission: EM | Admit: 2020-02-01 | Discharge: 2020-02-01 | Disposition: A | Discharge status: Home / Self Care | Payer: Medicare HMO | Attending: Emergency Medicine | Admitting: Emergency Medicine

## 2020-02-01 ENCOUNTER — Other Ambulatory Visit: Payer: Self-pay

## 2020-02-01 DIAGNOSIS — R2232 Localized swelling, mass and lump, left upper limb: Secondary | ICD-10-CM | POA: Diagnosis not present

## 2020-02-01 DIAGNOSIS — I82622 Acute embolism and thrombosis of deep veins of left upper extremity: Secondary | ICD-10-CM | POA: Diagnosis not present

## 2020-02-01 DIAGNOSIS — R0789 Other chest pain: Secondary | ICD-10-CM | POA: Insufficient documentation

## 2020-02-01 DIAGNOSIS — R079 Chest pain, unspecified: Secondary | ICD-10-CM | POA: Diagnosis not present

## 2020-02-01 DIAGNOSIS — E1140 Type 2 diabetes mellitus with diabetic neuropathy, unspecified: Secondary | ICD-10-CM | POA: Diagnosis not present

## 2020-02-01 DIAGNOSIS — I2510 Atherosclerotic heart disease of native coronary artery without angina pectoris: Secondary | ICD-10-CM | POA: Insufficient documentation

## 2020-02-01 DIAGNOSIS — Z79899 Other long term (current) drug therapy: Secondary | ICD-10-CM | POA: Diagnosis not present

## 2020-02-01 DIAGNOSIS — Z951 Presence of aortocoronary bypass graft: Secondary | ICD-10-CM | POA: Insufficient documentation

## 2020-02-01 DIAGNOSIS — Z794 Long term (current) use of insulin: Secondary | ICD-10-CM | POA: Insufficient documentation

## 2020-02-01 DIAGNOSIS — I10 Essential (primary) hypertension: Secondary | ICD-10-CM | POA: Diagnosis not present

## 2020-02-01 DIAGNOSIS — M7989 Other specified soft tissue disorders: Secondary | ICD-10-CM

## 2020-02-01 DIAGNOSIS — Z9889 Other specified postprocedural states: Secondary | ICD-10-CM | POA: Diagnosis not present

## 2020-02-01 LAB — BASIC METABOLIC PANEL
Anion gap: 9 (ref 5–15)
BUN: 17 mg/dL (ref 8–23)
CO2: 26 mmol/L (ref 22–32)
Calcium: 8.7 mg/dL — ABNORMAL LOW (ref 8.9–10.3)
Chloride: 99 mmol/L (ref 98–111)
Creatinine, Ser: 0.71 mg/dL (ref 0.61–1.24)
GFR calc Af Amer: >60 mL/min (ref >60)
GFR calc non Af Amer: >60 mL/min (ref >60)
Glucose, Bld: 294 mg/dL — ABNORMAL HIGH (ref 70–99)
Potassium: 4.5 mmol/L (ref 3.5–5.1)
Sodium: 134 mmol/L — ABNORMAL LOW (ref 135–145)

## 2020-02-01 LAB — CBC WITH DIFFERENTIAL/PLATELET
Abs Immature Granulocytes: 0.02 10*3/uL (ref 0.00–0.07)
Basophils Absolute: 0.1 10*3/uL (ref 0.0–0.1)
Basophils Relative: 1 %
Eosinophils Absolute: 0.1 10*3/uL (ref 0.0–0.5)
Eosinophils Relative: 2 %
HCT: 35.1 % — ABNORMAL LOW (ref 39.0–52.0)
Hemoglobin: 11.8 g/dL — ABNORMAL LOW (ref 13.0–17.0)
Immature Granulocytes: 0 %
Lymphocytes Relative: 20 %
Lymphs Abs: 1.4 10*3/uL (ref 0.7–4.0)
MCH: 31.9 pg (ref 26.0–34.0)
MCHC: 33.6 g/dL (ref 30.0–36.0)
MCV: 94.9 fL (ref 80.0–100.0)
Monocytes Absolute: 0.7 10*3/uL (ref 0.1–1.0)
Monocytes Relative: 10 %
Neutro Abs: 4.5 10*3/uL (ref 1.7–7.7)
Neutrophils Relative %: 67 %
Platelets: 246 10*3/uL (ref 150–400)
RBC: 3.7 MIL/uL — ABNORMAL LOW (ref 4.22–5.81)
RDW: 14.3 % (ref 11.5–15.5)
WBC: 6.7 10*3/uL (ref 4.0–10.5)
nRBC: 0 % (ref 0.0–0.2)

## 2020-02-01 MED ORDER — SODIUM CHLORIDE 0.9 % IV SOLN
1.0000 g | Freq: Once | INTRAVENOUS | Status: AC
  Administered 2020-02-01: 1 g via INTRAVENOUS
  Filled 2020-02-01: qty 10

## 2020-02-01 MED ORDER — CEPHALEXIN 500 MG PO CAPS
500.0000 mg | ORAL_CAPSULE | Freq: Three times a day (TID) | ORAL | 0 refills | Status: DC
Start: 2020-02-01 — End: 2020-03-29

## 2020-02-01 MED ORDER — OXYCODONE HCL 5 MG PO TABS
5.0000 mg | ORAL_TABLET | Freq: Once | ORAL | Status: AC
  Administered 2020-02-01: 5 mg via ORAL
  Filled 2020-02-01: qty 1

## 2020-02-01 NOTE — Discharge Instructions (Addendum)
You are scheduled to return here tomorrow morning (Monday May 31)for an ultrasound of your left arm.  Your appointment is at 8:00 AM.  Please arrive 15 minutes early to register.  As discussed, continue taking your aspirin daily and apply warm compresses on and off to your left arm and hand.  Call Dr. Leonarda Salon office to arrange a follow-up appointment to be seen on Tuesday, June 1 for recheck

## 2020-02-01 NOTE — ED Provider Notes (Signed)
[place] [facility] Provider Note   CSN: [id number] Arrival date & time: 02/01/20  1306     History Chief Complaint  Patient presents with  . Arm Swelling    Mark Foster is a 64 y.o. male.  HPI      Mark Foster is a 64 y.o. male with past medical history of CABG 01/26/20 discharged home yesterday, coronary artery disease, type 2 diabetes, stroke, and pancreatic cancer who presents to the Emergency Department requesting evaluation for increasing swelling of his left hand extending to his upper arm.  He was discharged from [facility] yesterday following his quadruple bypass.  He notes some swelling to his hand last evening that was worsed upon awakening this morning.  He states that he had an IV in his left hand for several days during his admission.  He also complains of some tightness to his hand with movement and gripping.  No numbness or tingling.  No drainage.  No fever or chills.  Patient also complains of some heaviness of his chest and pain along the incision site.  He denies cough and shortness of breath.    CT surgeon: Dr. Roxan Hockey  Past Medical History:  Diagnosis Date  . Carotid artery obstruction, left    Left ICA occlusion  . Cervical compression fracture ([place])   . Cervical spinal stenosis   . Coronary artery disease   . Diabetic neuropathy (HCC)    Bilateral legs  . Diverticulitis   . Family history of pancreatic cancer   . History of kidney stones   . Hypothyroidism   . Pancreatic cancer ([place])   . Stenosis of left vertebral artery   . Stroke (cerebrum) ([place]) 10/09/2019  . Stroke [facility])    2011  . Type 2 diabetes mellitus [facility])     Patient Active Problem List   Diagnosis Date Noted  . 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 Abnormal cardiac CT angiography 01/08/2020  . Angina pectoris ([place])   . CVA (cerebral vascular accident) ([place]) 10/08/2019  . Injury of left ankle 03/24/2019  . Port-A-Cath in place 12/19/2018  . Genetic testing 11/15/2018  .  Loss of consciousness ([place]) 11/12/2018  . Generalized weakness 11/11/2018  . Family history of pancreatic cancer   . Hyperglycemia 10/09/2018  . Tobacco abuse counseling 10/09/2018  . Pancreatic adenocarcinoma ([place]) 09/03/2018  . H/O ETOH abuse 09/03/2018  . Hyperbilirubinemia 09/03/2018  . 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 Uncontrolled type 2 diabetes mellitus with hyperglycemia ([place]) 03/05/2018  . Hypothyroidism 03/05/2018  . Intracranial atherosclerosis 01/22/2018  . Spinal stenosis in cervical region   . Diabetes mellitus type 2 in nonobese (HCC)   . Left carotid artery occlusion 01/21/2018  . Cervical stenosis of spinal canal 01/21/2018  . Right hand weakness 01/21/2018  . Numbness of right hand 01/21/2018  . Essential hypertension, benign 01/21/2018  . Mixed hyperlipidemia 01/21/2018  . Diabetes ([place]) 01/21/2018  . Hx of completed stroke 01/21/2018  . Leukopenia 01/21/2018  . Thrombocytopenia ([place]) 01/21/2018  . L MCA Stroke-like episode ([place]) s/p IV tPA 01/19/2018    Past Surgical History:  Procedure Laterality Date  . APPENDECTOMY    . BUBBLE STUDY N/A 11/18/2019   Procedure: BUBBLE STUDY;  Surgeon: Herminio Commons, MD;  Location: [facility];  Service: Cardiology;  Laterality: N/A;  . COLON RESECTION     For diverticulitis  . COLON SURGERY    . CORONARY ARTERY BYPASS GRAFT N/A 01/26/2020  Procedure: CORONARY ARTERY BYPASS GRAFTING (CABG) TIMES FOUR USING LEFT INTERNAL MAMMARY VEIN AND RIGHT GREATER SAPHENOUS VEIN;  Surgeon: Melrose Nakayama, MD;  Location: [facility];  Service: Open Heart Surgery;  Laterality: N/A;  . ENDOVEIN HARVEST OF GREATER SAPHENOUS VEIN Right 01/26/2020   Procedure: [place] Of Greater Saphenous Vein;  Surgeon: Melrose Nakayama, MD;  Location: [facility];  Service: Open Heart Surgery;  Laterality: Right;  . ESOPHAGOGASTRODUODENOSCOPY N/A 10/03/2018   Procedure: ESOPHAGOGASTRODUODENOSCOPY (EGD);   Surgeon: [name], MD;  Location: [name];  Service: Endoscopy;  Laterality: N/A;  . EUS N/A 10/03/2018   Procedure: UPPER ENDOSCOPIC ULTRASOUND (EUS) RADIAL;  Surgeon: [name], MD;  Location: [facility];  Service: Endoscopy;  Laterality: N/A;  . EYE SURGERY Bilateral   . FINE NEEDLE ASPIRATION N/A 10/03/2018   Procedure: FINE NEEDLE ASPIRATION (FNA) LINEAR;  Surgeon: [name], MD;  Location: [facility];  Service: Endoscopy;  Laterality: N/A;  . IR ANGIO INTRA EXTRACRAN SEL COM CAROTID INNOMINATE BILAT MOD SED  01/21/2018  . IR ANGIO INTRA EXTRACRAN SEL COM CAROTID INNOMINATE UNI R MOD SED  03/21/2018  . IR ANGIO VERTEBRAL SEL VERTEBRAL BILAT MOD SED  01/21/2018  . IR TRANSCATH EXCRAN VERT OR CAR A STENT  03/21/2018  . LEFT HEART CATH AND CORONARY ANGIOGRAPHY N/A 01/08/2020   Procedure: LEFT HEART CATH AND CORONARY ANGIOGRAPHY;  Surgeon: Leonie Man, MD;  Location: [place] [facility];  Service: Cardiovascular;  Laterality: N/A;  . PORTACATH PLACEMENT Left 12/23/2018   Procedure: INSERTION PORT-A-CATH;  Surgeon: Virl Cagey, MD;  Location: [facility];  Service: General;  Laterality: Left;  . RADIOLOGY WITH ANESTHESIA N/A 03/21/2018   Procedure: IR WITH ANESTHESIA WITH STENT PLACEMENT;  Surgeon: Luanne Bras, MD;  Location: [facility];  Service: Radiology;  Laterality: N/A;  . ROTATOR CUFF REPAIR     Left  . SPLENECTOMY    . TEE WITHOUT CARDIOVERSION N/A 11/18/2019   Procedure: TRANSESOPHAGEAL ECHOCARDIOGRAM (TEE) WITH PROPOFOL;  Surgeon: Herminio Commons, MD;  Location[facility];  Service: Cardiology;  Laterality: N/A;  . TEE WITHOUT CARDIOVERSION N/A 01/26/2020   Procedure: TRANSESOPHAGEAL ECHOCARDIOGRAM (TEE);  Surgeon: Melrose Nakayama, MD;  Location: [place];  Service: Open Heart Surgery;  Laterality: N/A;  . TONSILLECTOMY         Family History  Problem Relation Age of Onset  . Stroke Mother   . Pancreatic cancer Father 67       d. 33  . Stroke  Maternal Grandmother   . Heart attack Maternal Grandfather   . Heart Problems Paternal Grandfather   . Scoliosis Daughter   . Muscular dystrophy Grandson     Social History   Tobacco Use  . Smoking status: Former Smoker    Start date: 10/06/2019  . Smokeless tobacco: Never Used  Substance Use Topics  . Alcohol use: Yes    Alcohol/week: 1.0 standard drinks    Types: 1 Cans of beer per week    Comment: rare   . Drug use: Never    Home Medications Prior to Admission medications   Medication Sig Start Date End Date Taking? Authorizing Provider  acetaminophen (TYLENOL) 500 MG tablet Take 2 tablets (1,000 mg total) by mouth every 6 (six) hours. 01/31/20   Elgie Collard, PA-C  aspirin EC 81 MG EC tablet Take 1 tablet (81 mg total) by mouth daily. 01/31/20   Elgie Collard, PA-C  atorvastatin (LIPITOR) 80 MG  tablet TAKE 1 TABLET(80 MG) BY MOUTH DAILY AT 6 PM Patient taking differently: Take 80 mg by mouth daily at 6 PM.  11/18/19   Hurshel Party C, MD  clopidogrel (PLAVIX) 75 MG tablet Take 1 tablet (75 mg total) by mouth daily. 10/11/19   Domenic Polite, MD  ezetimibe (ZETIA) 10 MG tablet Take 1 tablet (10 mg total) by mouth daily. 01/06/20 04/05/20  Herminio Commons, MD  gabapentin (NEURONTIN) 300 MG capsule Take 1 capsule (300 mg total) by mouth 2 (two) times daily. 11/07/19   Frann Rider, NP  insulin aspart (NOVOLOG) 100 UNIT/ML FlexPen Inject 6 Units into the skin 3 (three) times daily with meals. Patient taking differently: Inject 6-10 Units into the skin 3 (three) times daily with meals.  12/03/19   Cassandria Anger, MD  insulin glargine (LANTUS SOLOSTAR) 100 UNIT/ML Solostar Pen Inject 36 Units into the skin at bedtime. 01/20/20   Gosrani, Nimish C, MD  Insulin Pen Needle 32G X 4 MM MISC 1 Device by Does not apply route daily. 01/22/18   Donzetta Starch, NP  levothyroxine (SYNTHROID) 50 MCG tablet Take 1 tablet (50 mcg total) by mouth daily. 01/12/20   Doree Albee, MD    lidocaine-prilocaine (EMLA) cream Apply 1 application topically daily as needed (prior to port being accessed (~every 3 months)).    [provider]  loperamide (IMODIUM A-D) 2 MG tablet Take 1 tablet (2 mg total) by mouth as needed. Take 2 at diarrhea onset , then 1 every 2hr until 12hrs with no BM. May take 2 every 4hrs at night. If diarrhea recurs repeat. Patient taking differently: Take 2 mg by mouth as needed for diarrhea or loose stools.  12/19/18   Derek Jack, MD  meclizine (ANTIVERT) 25 MG tablet Take 1 tablet (25 mg total) by mouth 3 (three) times daily as needed for dizziness. 01/12/20   Derek Jack, MD  metoprolol tartrate (LOPRESSOR) 25 MG tablet Take 1 tablet (25 mg total) by mouth 2 (two) times daily. 01/06/20 04/05/20  Herminio Commons, MD  Omega-3 1000 MG CAPS Take 1,000 mg by mouth daily.    [provider]  oxyCODONE (OXY IR/ROXICODONE) 5 MG immediate release tablet Take 1 tablet (5 mg total) by mouth every 6 (six) hours as needed for severe pain. 01/31/20   Elgie Collard, PA-C  pantoprazole (PROTONIX) 20 MG tablet Take 20 mg by mouth daily as needed (heartburn/indigestion.).  11/25/18 01/19/21  [provider]  sertraline (ZOLOFT) 50 MG tablet TAKE 1 TABLET BY MOUTH DAILY Patient taking differently: Take 50 mg by mouth daily.  07/05/19   Ailene Ards, NP  tamsulosin (FLOMAX) 0.4 MG CAPS capsule Take 1 capsule (0.4 mg total) by mouth daily. 11/07/19   McKenzie, Candee Furbish, MD  vitamin B-12 (CYANOCOBALAMIN) 1000 MCG tablet Take 1,000 mcg by mouth daily.    [provider]  atorvastatin (LIPITOR) 80 MG tablet Take 1 tablet (80 mg total) by mouth daily at 6 PM. 05/15/19   Doree Albee, MD    Allergies    Demerol [meperidine hcl]  Review of Systems   Review of Systems  Constitutional: Negative for chills and fever.  Respiratory: Negative for chest tightness and shortness of breath.   Cardiovascular: Negative for chest pain.   Gastrointestinal: Negative for abdominal pain, diarrhea, nausea and vomiting.  Musculoskeletal: Negative for arthralgias and neck pain.  Skin: Positive for color change.       Swelling of  the left dorsal hand and arm with redness of hand  Neurological: Negative for dizziness, weakness and numbness.  Psychiatric/Behavioral: Negative for confusion.    Physical Exam Updated Vital Signs BP 124/72 (BP Location: Right Arm)   Pulse 94   Temp 98.7 F (37.1 C) (Oral)   Resp 18   Ht 5\' 10"  (1.778 m)   Wt 79.4 kg   SpO2 93%   BMI 25.12 kg/m   Physical Exam Vitals and nursing note reviewed.  Constitutional:      Appearance: He is not toxic-appearing.  HENT:     Mouth/Throat:     Mouth: Mucous membranes are moist.  Cardiovascular:     Rate and Rhythm: Normal rate and regular rhythm.     Pulses: Normal pulses.  Pulmonary:     Effort: Pulmonary effort is normal.     Breath sounds: Normal breath sounds.     Comments: Pt has a midline incision to the chest wall.  No edema, drainage or surrounding erythema.  Mild focal tenderness at the incision site. Abdominal:     General: There is no distension.     Palpations: Abdomen is soft.     Tenderness: There is no abdominal tenderness.  Musculoskeletal:        General: Swelling present.     Cervical back: Normal range of motion.     Right lower leg: No edema.     Left lower leg: No edema.     Comments: Marked edema of the left dorsal hand extending to the upper arm.  Scant erythema of the hand w/o lymphangitis.  No excessive warmth.  Patient has full range of motion of the left wrist, elbow and shoulder.  Skin:    General: Skin is warm.     Capillary Refill: Capillary refill takes less than 2 seconds.     Comments: See photos  Neurological:     General: No focal deficit present.     Mental Status: He is alert.     Sensory: No sensory deficit.     Motor: No weakness.         ED Results / Procedures / Treatments   Labs (all  labs ordered are listed, but only abnormal results are displayed) Labs Reviewed  CBC WITH DIFFERENTIAL/PLATELET - Abnormal; Notable for the following components:      Result Value   RBC 3.70 (*)    Hemoglobin 11.8 (*)    HCT 35.1 (*)    All other components within normal limits  BASIC METABOLIC PANEL    EKG EKG Interpretation  Date/Time:  Sunday Feb 01 2020 13:28:07 EDT Ventricular Rate:  92 PR Interval:    QRS Duration: 88 QT Interval:  362 QTC Calculation: 448 R Axis:   39 Text Interpretation: Sinus rhythm since last tracing no significant change Confirmed by Noemi Chapel [phone]) on 02/01/2020 3:08:46 PM   Radiology DG Chest Portable 1 View  Result Date: 02/01/2020 CLINICAL DATA:  Chest pain and left arm swelling.  Recent CABG. EXAM: PORTABLE CHEST 1 VIEW COMPARISON:  01/29/2020 from [facility] FINDINGS: Heart size remains normal. Prior CABG. Left sided power port remains in appropriate position. Small bilateral pleural effusions and mild bibasilar atelectasis show no significant change. No evidence of pulmonary consolidation or edema. No pneumothorax visualized. IMPRESSION: Stable small bilateral pleural effusions and mild bibasilar atelectasis. No pneumothorax visualized. Electronically Signed   By: Marlaine Hind M.D.   On: 02/01/2020 14:22    Procedures Procedures (including critical  care time)  Medications Ordered in ED Medications - No data to display  ED Course  I have reviewed the triage vital signs and the nursing notes.  Pertinent labs & imaging results that were available during my care of the patient were reviewed by me and considered in my medical decision making (see chart for details).    MDM Rules/Calculators/A&P                      Patient here after hospital admission for CABG 6 days ago with increasing swelling of his left hand and arm.  This is at the site of IV placement.  He is currently complaining of some heaviness of his chest with  tenderness along the incision site with tightness to his hand with gripping. Cap refill is good and sensation intact  Mild erythema is noted along the dorsal hand with obvious edema, but without significant warmth.  Vitals reviewed and patient afebrile.  EKG upon arrival is unremarkable and there is no leukocytosis or significant anemia. Patient will need evaluation for DVT, which is unavailable here on weekends.  His differential would also include cellulitis versus phlebitis.  I will consult cardiothoracic surgery  [place] Dr. Kipp Brood and discussed findings.  He recommended warm compresses and pt to continue his ASA.  Dr. Roxan Hockey will see pt in office on Tuesday.    Confirmed that we have ultrasound availability for tomorrow, May 31.  I have scheduled him to return here tomorrow morning at 8 AM for DVT study of his left arm. Pt agrees to this plan.    Final Clinical Impression(s) / ED Diagnoses Final diagnoses:  Left upper extremity swelling    Rx / DC Orders ED Discharge Orders    None       Bufford Lope 02/01/20 1623    Noemi Chapel, MD 02/02/20 [phone]

## 2020-02-01 NOTE — ED Provider Notes (Signed)
This patient is a 64 year old male, underwent cardiac bypass this last week, was admitted on Monday and discharged yesterday. He reports that over the last couple of days he has noted some increasing swelling of his left arm. This occurred while he was in the hospital but worsened quickly after removal of the peripheral IV in the dorsum of his hand. Over the last 24 hours the swelling is progressed up into the upper arm. He has no increasing shortness of breath but does feel some chest heaviness. He is not have any fevers or chills, he was kind of sweaty today.  On exam this patient does not fact have edema and swelling of his left upper extremity extending to the proximal humerus, there is no significant redness or warmth but it is a boggy edema present up the arm. He has no JVD, heart and lung sounds are clear, sternotomy incision appears intact clean and dry. He is not in respiratory distress but appears uncomfortable. His pulses are present at the wrists bilaterally.  He will need labs, examination for DVT, chest x-ray, EKG is unremarkable, the patient will likely need to have the DVT work-up prior to being discharged as well as treatment for possible cellulitis or phlebitis after IV and swollen arm.   EKG Interpretation  Date/Time:    Ventricular Rate:    PR Interval:    QRS Duration:   QT Interval:    QTC Calculation:   R Axis:     Text Interpretation:          Vitals:   02/01/20 1316  BP: 124/72  Pulse: 94  Resp: 18  Temp: 98.7 F (37.1 C)  SpO2: 93%     Medical screening examination/treatment/procedure(s) were conducted as a shared visit with non-physician practitioner(s) and myself.  I personally evaluated the patient during the encounter.  Clinical Impression:   Final diagnoses:  Left upper extremity swelling         Noemi Chapel, MD 02/02/20 [phone]

## 2020-02-01 NOTE — ED Triage Notes (Signed)
Pt reports he was discharged from [facility] following quadruple by-pass surgery, reports last night left arm began swelling, swelling noted to left hand up left arm with weeping

## 2020-02-02 ENCOUNTER — Telehealth (HOSPITAL_COMMUNITY): Payer: Self-pay | Admitting: Emergency Medicine

## 2020-02-02 ENCOUNTER — Ambulatory Visit (HOSPITAL_COMMUNITY): Payer: Medicare HMO | Attending: Emergency Medicine

## 2020-02-02 ENCOUNTER — Emergency Department (HOSPITAL_COMMUNITY)
Admission: EM | Admit: 2020-02-02 | Discharge: 2020-02-02 | Discharge status: Absent without leave | Payer: Medicare HMO | Source: Home / Self Care

## 2020-02-02 DIAGNOSIS — M7989 Other specified soft tissue disorders: Secondary | ICD-10-CM | POA: Diagnosis not present

## 2020-02-02 DIAGNOSIS — M79642 Pain in left hand: Secondary | ICD-10-CM | POA: Diagnosis not present

## 2020-02-02 DIAGNOSIS — I82622 Acute embolism and thrombosis of deep veins of left upper extremity: Secondary | ICD-10-CM | POA: Diagnosis not present

## 2020-02-02 DIAGNOSIS — R52 Pain, unspecified: Secondary | ICD-10-CM

## 2020-02-02 MED ORDER — APIXABAN 5 MG PO TABS
5.0000 mg | ORAL_TABLET | Freq: Two times a day (BID) | ORAL | 0 refills | Status: DC
Start: 2020-02-02 — End: 2020-05-17

## 2020-02-02 MED ORDER — APIXABAN 5 MG PO TABS
5.0000 mg | ORAL_TABLET | Freq: Two times a day (BID) | ORAL | 0 refills | Status: DC
Start: 2020-02-02 — End: 2020-02-02

## 2020-02-02 NOTE — ED Provider Notes (Signed)
   Patient returns today for scheduled outpatient venous ultrasound of his left upper extremity.  History of peripheral IV to the left hand for several days during hospital stay for cardiac bypass procedure.  Seen in the emergency department yesterday for hand pain and swelling.     US Venous Img Upper Uni Left (DVT)  Result Date: 02/02/2020 CLINICAL DATA:  Left upper extremity pain and swelling EXAM: LEFT UPPER EXTREMITY VENOUS DUPLEX ULTRASOUND TECHNIQUE: Gray-scale sonography with graded compression, as well as color Doppler and duplex ultrasound were performed to evaluate the left upper extremity deep venous system from the level of the subclavian vein and including the jugular, axillary, basilic, radial, ulnar and upper cephalic vein. Spectral Doppler was utilized to evaluate flow at rest and with distal augmentation maneuvers. COMPARISON:  None. FINDINGS: Contralateral Subclavian Vein: Respiratory phasicity is normal and symmetric with the symptomatic side. No evidence of thrombus. Normal compressibility. Internal Jugular Vein: There is thrombus, acute appearing, in much of the internal jugular vein with loss of compression and no appreciable demonstrable flow. Subclavian Vein: There is acute appearing thrombus throughout the left subclavian vein without appreciable compression or demonstrable flow. Axillary Vein: There is acute appearing deep venous thrombosis throughout this vessel with loss of flow. Loss of compressibility. Cephalic Vein: There is acute appearing thrombus throughout this vessel with loss of flow and loss of compressibility. Basilic Vein: Acute appearing thrombus in this vessel with loss of flow and compression. Brachial Veins: Acute appearing thrombus in the brachial vein with incomplete obstruction. Limited flow and compressibility. Radial Veins: Acute appearing thrombus in the radial vein with incomplete obstruction. Limited flow and compressibility. Ulnar Veins: Acute appearing  thrombus in the ulnar vein with incomplete obstruction. Limited flow and compressibility. Venous Reflux:  None visualized. Other Findings:  None visualized. IMPRESSION: Extensive deep and superficial venous thrombosis throughout the left upper extremity. Limited flow more distally with essentially complete obstruction more proximally. Right subclavian vein patent. Electronically Signed   By: Lowella Grip III M.D.   On: 02/02/2020 08:55   [id number]  Consulted CT surgery, Dr. Servando Snare and discussed findings.  He feels from a surgical standpoint, patient would be appropriate to start Eliquis.  He recommends that oncologist also be consulted given the patient has a Port-A-Cath on the affected side.  0955 consulted Dr. Delton Coombes and discussed findings.  Felt that Eliquis would be appropriate did not feel that Port-A-Cath needed to be removed at this time.  Patient prescribed Eliquis and electronically sent to pharmacy.  Patient agrees to follow-up with his cardiothoracic surgeon, Dr. Roxan Hockey tomorrow (Tuesday, June 1) in his office.   Kem Parkinson, PA-C 02/02/20 1114    Noemi Chapel, MD 02/06/20 2023

## 2020-02-02 NOTE — Telephone Encounter (Cosign Needed)
See other note

## 2020-02-02 NOTE — Telephone Encounter (Cosign Needed)
Initial Eliquis prescription electronically sent to [facility].  Today is a  holiday and [facility] is closed. pharmacy contacted to discontinue prescription and prescription then sent to [facility] per patient request.

## 2020-02-02 NOTE — ED Notes (Signed)
Paged Cardio-thoracic

## 2020-02-03 ENCOUNTER — Telehealth: Payer: Self-pay | Admitting: Thoracic Surgery (Cardiothoracic Vascular Surgery)

## 2020-02-03 NOTE — Telephone Encounter (Signed)
I called Mr. Mark Foster to check on hinm after learning of visit to ED and dx of DVT in left subclavian/ arm.  He complains of pain and swelling in left arm. "A little better, but not much."Also c/o sternal pain after being "yanked up" in bed by technician in ED.  He is taking his Eliquis and remains on ASA and plavix as well.  Will check with Dr. Leonie Man on whether to continue plavix and Eliquis.  Revonda Standard Roxan Hockey, MD [facility] [phone]

## 2020-02-04 ENCOUNTER — Telehealth: Payer: Self-pay | Admitting: Thoracic Surgery (Cardiothoracic Vascular Surgery)

## 2020-02-04 DIAGNOSIS — E1140 Type 2 diabetes mellitus with diabetic neuropathy, unspecified: Secondary | ICD-10-CM | POA: Diagnosis not present

## 2020-02-04 DIAGNOSIS — E039 Hypothyroidism, unspecified: Secondary | ICD-10-CM | POA: Diagnosis not present

## 2020-02-04 DIAGNOSIS — I10 Essential (primary) hypertension: Secondary | ICD-10-CM | POA: Diagnosis not present

## 2020-02-04 DIAGNOSIS — I82622 Acute embolism and thrombosis of deep veins of left upper extremity: Secondary | ICD-10-CM | POA: Diagnosis not present

## 2020-02-04 DIAGNOSIS — I2510 Atherosclerotic heart disease of native coronary artery without angina pectoris: Secondary | ICD-10-CM | POA: Diagnosis not present

## 2020-02-04 DIAGNOSIS — E782 Mixed hyperlipidemia: Secondary | ICD-10-CM | POA: Diagnosis not present

## 2020-02-04 DIAGNOSIS — I82612 Acute embolism and thrombosis of superficial veins of left upper extremity: Secondary | ICD-10-CM | POA: Diagnosis not present

## 2020-02-04 DIAGNOSIS — M4802 Spinal stenosis, cervical region: Secondary | ICD-10-CM | POA: Diagnosis not present

## 2020-02-04 DIAGNOSIS — Z48812 Encounter for surgical aftercare following surgery on the circulatory system: Secondary | ICD-10-CM | POA: Diagnosis not present

## 2020-02-04 NOTE — Telephone Encounter (Signed)
I communicated with Dr. Leonie Man who agrees with me that ASA + Eliquis is acceptable anticoagulation for Mark Foster and OK to stop Plavix to avoid risk of bleeding complications.  I instructed Mark Foster to hold clopidogrel (Plavix) and to continue baby aspirin and Eliquis.  I will see him in the office next Tuesday  Remo Lipps C. Roxan Hockey, MD [facility] [phone]

## 2020-02-05 DIAGNOSIS — Z48812 Encounter for surgical aftercare following surgery on the circulatory system: Secondary | ICD-10-CM | POA: Diagnosis not present

## 2020-02-05 DIAGNOSIS — I2510 Atherosclerotic heart disease of native coronary artery without angina pectoris: Secondary | ICD-10-CM | POA: Diagnosis not present

## 2020-02-05 DIAGNOSIS — I10 Essential (primary) hypertension: Secondary | ICD-10-CM | POA: Diagnosis not present

## 2020-02-05 DIAGNOSIS — E782 Mixed hyperlipidemia: Secondary | ICD-10-CM | POA: Diagnosis not present

## 2020-02-05 DIAGNOSIS — E1140 Type 2 diabetes mellitus with diabetic neuropathy, unspecified: Secondary | ICD-10-CM | POA: Diagnosis not present

## 2020-02-05 DIAGNOSIS — M4802 Spinal stenosis, cervical region: Secondary | ICD-10-CM | POA: Diagnosis not present

## 2020-02-05 DIAGNOSIS — I82612 Acute embolism and thrombosis of superficial veins of left upper extremity: Secondary | ICD-10-CM | POA: Diagnosis not present

## 2020-02-05 DIAGNOSIS — I82622 Acute embolism and thrombosis of deep veins of left upper extremity: Secondary | ICD-10-CM | POA: Diagnosis not present

## 2020-02-05 DIAGNOSIS — E039 Hypothyroidism, unspecified: Secondary | ICD-10-CM | POA: Diagnosis not present

## 2020-02-09 ENCOUNTER — Ambulatory Visit: Payer: Medicare HMO | Admitting: Cardiology

## 2020-02-09 ENCOUNTER — Other Ambulatory Visit: Payer: Self-pay | Admitting: Thoracic Surgery (Cardiothoracic Vascular Surgery)

## 2020-02-09 DIAGNOSIS — E782 Mixed hyperlipidemia: Secondary | ICD-10-CM | POA: Diagnosis not present

## 2020-02-09 DIAGNOSIS — E039 Hypothyroidism, unspecified: Secondary | ICD-10-CM | POA: Diagnosis not present

## 2020-02-09 DIAGNOSIS — I2510 Atherosclerotic heart disease of native coronary artery without angina pectoris: Secondary | ICD-10-CM | POA: Diagnosis not present

## 2020-02-09 DIAGNOSIS — Z951 Presence of aortocoronary bypass graft: Secondary | ICD-10-CM

## 2020-02-09 DIAGNOSIS — M4802 Spinal stenosis, cervical region: Secondary | ICD-10-CM | POA: Diagnosis not present

## 2020-02-09 DIAGNOSIS — E1140 Type 2 diabetes mellitus with diabetic neuropathy, unspecified: Secondary | ICD-10-CM | POA: Diagnosis not present

## 2020-02-09 DIAGNOSIS — I82612 Acute embolism and thrombosis of superficial veins of left upper extremity: Secondary | ICD-10-CM | POA: Diagnosis not present

## 2020-02-09 DIAGNOSIS — Z48812 Encounter for surgical aftercare following surgery on the circulatory system: Secondary | ICD-10-CM | POA: Diagnosis not present

## 2020-02-09 DIAGNOSIS — I10 Essential (primary) hypertension: Secondary | ICD-10-CM | POA: Diagnosis not present

## 2020-02-09 DIAGNOSIS — I82622 Acute embolism and thrombosis of deep veins of left upper extremity: Secondary | ICD-10-CM | POA: Diagnosis not present

## 2020-02-10 ENCOUNTER — Other Ambulatory Visit: Payer: Self-pay

## 2020-02-10 ENCOUNTER — Ambulatory Visit
Admission: RE | Admit: 2020-02-10 | Discharge: 2020-02-10 | Disposition: A | Discharge status: Home / Self Care | Payer: Medicare HMO | Source: Ambulatory Visit | Attending: Thoracic Surgery (Cardiothoracic Vascular Surgery) | Admitting: Thoracic Surgery (Cardiothoracic Vascular Surgery)

## 2020-02-10 ENCOUNTER — Ambulatory Visit (INDEPENDENT_AMBULATORY_CARE_PROVIDER_SITE_OTHER): Payer: Self-pay | Admitting: Thoracic Surgery (Cardiothoracic Vascular Surgery)

## 2020-02-10 ENCOUNTER — Encounter: Payer: Self-pay | Admitting: Thoracic Surgery (Cardiothoracic Vascular Surgery)

## 2020-02-10 VITALS — BP 116/64 | HR 110 | Temp 97.2°F | Resp 24 | Ht 70.0 in | Wt 151.6 lb

## 2020-02-10 DIAGNOSIS — Z951 Presence of aortocoronary bypass graft: Secondary | ICD-10-CM

## 2020-02-10 DIAGNOSIS — J90 Pleural effusion, not elsewhere classified: Secondary | ICD-10-CM | POA: Diagnosis not present

## 2020-02-10 MED ORDER — OXYCODONE HCL 5 MG PO TABS: 5.0000 mg | ORAL_TABLET | Freq: Four times a day (QID) | ORAL | 0 refills | Status: DC | PRN

## 2020-02-10 NOTE — Progress Notes (Signed)
Cardiology Office Note    Date:  02/11/2020   ID:  Alexa Golebiewski, DOB 02-12-1956, MRN [id number]  PCP:  Ailene Ards, NP  Cardiologist: Kate Sable, MD    Chief Complaint  Patient presents with  . Hospitalization Follow-up    History of Present Illness:    Mark Foster is a 64 y.o. male with past medical history of CAD (significant stenosis of LAD, LCx and RCA by Coronary CT in 12/2019), carotid artery stenosis (knonwn LICA occlusion), HTN, HLD, prior CVA (in 2011 and 10/2019) and history of pancreatic cancer (s/p Whipple procedure) who presents to the office today for hospital follow-up.   He was last examined by Dr. Bronson Ing in 01/2020 and recent Coronary CT had shown significant stenosis of the proximal/mid/distal LAD, distal LCx and mid to distal RCA, therefore a cardiac catheterization was recommended for definitive evaluation. Cardiac catheterization by Dr. Ellyn Hack on 01/08/2020 confirmed Coronary CTA findings with severe multivessel CAD and CABG was recommended with outpatient CVTS consult. He met with Dr. Roxan Hockey on 01/13/2020 and was in agreement to proceed with CABG after holding Plavix for 5 days prior to surgery. This was tentatively scheduled for 01/26/2020. He underwent CABG x4 with LIMA-LAD, SVG-OM, SVG-PDA and SVG-D1. He progressed well in the postoperative setting and was extubated per protocol. He did maintain normal sinus rhythm during admission and kidney function remained stable. He was discharged home on 01/31/2020 medical therapy included ASA 81 mg daily, Atorvastatin 80 mg daily, Plavix 75 mg daily, Zetia 10 mg daily, and Lopressor 25 mg twice daily  He did present back to the ED on 02/01/2020 for swelling along his left arm which progressed following hospital discharge. An upper extremity doppler was performed and showed extensive deep and superficial venous thrombosis throughout the left upper extremity and he was started on Eliquis for anticoagulation. CT Surgery  clarified with Neurology that Plavix could be discontinued given the need for anticoagulation.  In talking to the patient today, he reports overall progressing well since returning home. The swelling along his left arm has significantly improved since starting anticoagulation. He is being followed by home health and went for a walk around the block earlier today without any exertional chest pain or dyspnea on exertion. His sternal pain continues to improve.  He denies any specific orthopnea, PND or lower extremity edema.   Past Medical History:  Diagnosis Date  . CAD (coronary artery disease)    a. 01/2020: CABG x4 with LIMA-LAD, SVG-OM, SVG-PDA and SVG-D1  . Carotid artery obstruction, left    Left ICA occlusion  . Cervical compression fracture ([place])   . Cervical spinal stenosis   . Coronary artery disease   . Diabetic neuropathy (HCC)    Bilateral legs  . Diverticulitis   . Family history of pancreatic cancer   . History of kidney stones   . Hypothyroidism   . Pancreatic cancer ([place])   . Stenosis of left vertebral artery   . Stroke (cerebrum) ([place]) 10/09/2019  . Stroke [facility])    2011  . Type 2 diabetes mellitus ([place])     Past Surgical History:  Procedure Laterality Date  . APPENDECTOMY    . BUBBLE STUDY N/A 11/18/2019   Procedure: BUBBLE STUDY;  Surgeon: Herminio Commons, MD;  Location: [facility];  Service: Cardiology;  Laterality: N/A;  . COLON RESECTION     For diverticulitis  . COLON SURGERY    . CORONARY ARTERY BYPASS GRAFT N/A 01/26/2020   Procedure: CORONARY  ARTERY BYPASS GRAFTING (CABG) TIMES FOUR USING LEFT INTERNAL MAMMARY VEIN AND RIGHT GREATER SAPHENOUS VEIN;  Surgeon: Melrose Nakayama, MD;  Location: [place];  Service: Open Heart Surgery;  Laterality: N/A;  . ENDOVEIN HARVEST OF GREATER SAPHENOUS VEIN Right 01/26/2020   Procedure: [place] Of Greater Saphenous Vein;  Surgeon: Melrose Nakayama, MD;  Location: [place];  Service: Open Heart Surgery;   Laterality: Right;  . ESOPHAGOGASTRODUODENOSCOPY N/A 10/03/2018   Procedure: ESOPHAGOGASTRODUODENOSCOPY (EGD);  Surgeon: [name], MD;  Location: [name];  Service: Endoscopy;  Laterality: N/A;  . EUS N/A 10/03/2018   Procedure: UPPER ENDOSCOPIC ULTRASOUND (EUS) RADIAL;  Surgeon: [name], MD;  Location: [facility];  Service: Endoscopy;  Laterality: N/A;  . EYE SURGERY Bilateral   . FINE NEEDLE ASPIRATION N/A 10/03/2018   Procedure: FINE NEEDLE ASPIRATION (FNA) LINEAR;  Surgeon: [name], MD;  Location: [facility];  Service: Endoscopy;  Laterality: N/A;  . IR ANGIO INTRA EXTRACRAN SEL COM CAROTID INNOMINATE BILAT MOD SED  01/21/2018  . IR ANGIO INTRA EXTRACRAN SEL COM CAROTID INNOMINATE UNI R MOD SED  03/21/2018  . IR ANGIO VERTEBRAL SEL VERTEBRAL BILAT MOD SED  01/21/2018  . IR TRANSCATH EXCRAN VERT OR CAR A STENT  03/21/2018  . LEFT HEART CATH AND CORONARY ANGIOGRAPHY N/A 01/08/2020   Procedure: LEFT HEART CATH AND CORONARY ANGIOGRAPHY;  Surgeon: Leonie Man, MD;  Location: [place] [facility];  Service: Cardiovascular;  Laterality: N/A;  . PORTACATH PLACEMENT Left 12/23/2018   Procedure: INSERTION PORT-A-CATH;  Surgeon: Virl Cagey, MD;  Location: [facility];  Service: General;  Laterality: Left;  . RADIOLOGY WITH ANESTHESIA N/A 03/21/2018   Procedure: IR WITH ANESTHESIA WITH STENT PLACEMENT;  Surgeon: Luanne Bras, MD;  Location: [place];  Service: Radiology;  Laterality: N/A;  . ROTATOR CUFF REPAIR     Left  . SPLENECTOMY    . TEE WITHOUT CARDIOVERSION N/A 11/18/2019   Procedure: TRANSESOPHAGEAL ECHOCARDIOGRAM (TEE) WITH PROPOFOL;  Surgeon: Herminio Commons, MD;  Location: [facility];  Service: Cardiology;  Laterality: N/A;  . TEE WITHOUT CARDIOVERSION N/A 01/26/2020   Procedure: TRANSESOPHAGEAL ECHOCARDIOGRAM (TEE);  Surgeon: Melrose Nakayama, MD;  Location: [place];  Service: Open Heart Surgery;  Laterality: N/A;  . TONSILLECTOMY      Current  Medications: Outpatient Medications Prior to Visit  Medication Sig Dispense Refill  . acetaminophen (TYLENOL) 500 MG tablet Take 2 tablets (1,000 mg total) by mouth every 6 (six) hours. 30 tablet 0  . apixaban (ELIQUIS) 5 MG TABS tablet Take 1 tablet (5 mg total) by mouth 2 (two) times daily. 60 tablet 0  . aspirin EC 81 MG EC tablet Take 1 tablet (81 mg total) by mouth daily.    Marland Kitchen atorvastatin (LIPITOR) 80 MG tablet TAKE 1 TABLET(80 MG) BY MOUTH DAILY AT 6 PM (Patient taking differently: Take 80 mg by mouth daily at 6 PM. ) 90 tablet 0  . cephALEXin (KEFLEX) 500 MG capsule Take 1 capsule (500 mg total) by mouth 3 (three) times daily. 21 capsule 0  . ezetimibe (ZETIA) 10 MG tablet Take 1 tablet (10 mg total) by mouth daily. 30 tablet 6  . gabapentin (NEURONTIN) 300 MG capsule Take 1 capsule (300 mg total) by mouth 2 (two) times daily. 180 capsule 0  . insulin aspart (NOVOLOG) 100 UNIT/ML FlexPen Inject 6 Units into the skin 3 (three) times daily with meals. (Patient taking differently: Inject 6-10 Units into the  skin 3 (three) times daily with meals. ) 15 mL 0  . insulin glargine (LANTUS SOLOSTAR) 100 UNIT/ML Solostar Pen Inject 36 Units into the skin at bedtime. 5 pen 2  . Insulin Pen Needle 32G X 4 MM MISC 1 Device by Does not apply route daily. 50 each 2  . levothyroxine (SYNTHROID) 50 MCG tablet Take 1 tablet (50 mcg total) by mouth daily. 90 tablet 1  . lidocaine-prilocaine (EMLA) cream Apply 1 application topically daily as needed (prior to port being accessed (~every 3 months)).    Marland Kitchen meclizine (ANTIVERT) 25 MG tablet Take 1 tablet (25 mg total) by mouth 3 (three) times daily as needed for dizziness. 30 tablet 0  . metoprolol tartrate (LOPRESSOR) 25 MG tablet Take 1 tablet (25 mg total) by mouth 2 (two) times daily. 180 tablet 3  . Omega-3 1000 MG CAPS Take 1,000 mg by mouth daily.    Marland Kitchen oxyCODONE (OXY IR/ROXICODONE) 5 MG immediate release tablet Take 1 tablet (5 mg total) by mouth every 6  (six) hours as needed for severe pain. 20 tablet 0  . sertraline (ZOLOFT) 50 MG tablet TAKE 1 TABLET BY MOUTH DAILY (Patient taking differently: Take 50 mg by mouth daily. ) 90 tablet 1  . tamsulosin (FLOMAX) 0.4 MG CAPS capsule Take 1 capsule (0.4 mg total) by mouth daily. 90 capsule 3  . vitamin B-12 (CYANOCOBALAMIN) 1000 MCG tablet Take 1,000 mcg by mouth daily.    . clopidogrel (PLAVIX) 75 MG tablet Take 1 tablet (75 mg total) by mouth daily. (Patient not taking: Reported on 02/11/2020) 60 tablet 1   Facility-Administered Medications Prior to Visit  Medication Dose Route Frequency Provider Last Rate Last Admin  . 0.9 %  sodium chloride infusion   Intravenous Continuous Derek Jack, MD 20 mL/hr at 12/30/18 1351 New Bag at 12/30/18 1351  . 0.9 %  sodium chloride infusion   Intravenous Continuous Derek Jack, MD 20 mL/hr at 12/30/18 1401 New Bag at 12/30/18 1401  . dextrose 5 % solution   Intravenous Once Derek Jack, MD      . heparin lock flush 100 unit/mL  500 Units Intracatheter Once PRN Derek Jack, MD      . sodium chloride flush (NS) 0.9 % injection 10 mL  10 mL Intracatheter PRN Derek Jack, MD   10 mL at 12/30/18 0911  . sodium chloride flush (NS) 0.9 % injection 10 mL  10 mL Intracatheter PRN Derek Jack, MD   10 mL at 05/21/19 0845     Allergies:   Demerol [meperidine hcl]   Social History   Socioeconomic History  . Marital status: Single    Spouse name: Not on file  . Number of children: 2  . Years of education: Not on file  . Highest education level: Not on file  Occupational History  . Occupation: Estate agent  Tobacco Use  . Smoking status: Former Smoker    Start date: 10/06/2019  . Smokeless tobacco: Never Used  Substance and Sexual Activity  . Alcohol use: Yes    Alcohol/week: 1.0 standard drinks    Types: 1 Cans of beer per week    Comment: rare   . Drug use: Never  . Sexual activity: Not on file  Other  Topics Concern  . Not on file  Social History Narrative  . Not on file   Social Determinants of Health   Financial Resource Strain:   . Difficulty of Paying Living Expenses:   Food Insecurity:   .  Worried About Charity fundraiser in the Last Year:   . Arboriculturist in the Last Year:   Transportation Needs:   . Film/video editor (Medical):   Marland Kitchen Lack of Transportation (Non-Medical):   Physical Activity:   . Days of Exercise per Week:   . Minutes of Exercise per Session:   Stress:   . Feeling of Stress :   Social Connections:   . Frequency of Communication with Friends and Family:   . Frequency of Social Gatherings with Friends and Family:   . Attends Religious Services:   . Active Member of Clubs or Organizations:   . Attends Archivist Meetings:   Marland Kitchen Marital Status:      Family History:  The patient's family history includes Heart Problems in his paternal grandfather; Heart attack in his maternal grandfather; Muscular dystrophy in his grandson; Pancreatic cancer (age of onset: 2) in his father; Scoliosis in his daughter; Stroke in his maternal grandmother and mother.   Review of Systems:   Please see the history of present illness.     General:  No chills, fever, night sweats or weight changes.  Cardiovascular:  No dyspnea on exertion, edema, orthopnea, palpitations, paroxysmal nocturnal dyspnea. Positive for sternal chest pain.  Dermatological: No rash, lesions/masses Respiratory: No cough, dyspnea Urologic: No hematuria, dysuria Abdominal:   No nausea, vomiting, diarrhea, bright red blood per rectum, melena, or hematemesis Neurologic:  No visual changes, wkns, changes in mental status. All other systems reviewed and are otherwise negative except as noted above.   Physical Exam:    VS:  BP (!) 106/56   Pulse 90   Ht '5\' 10"'$  (1.778 m)   Wt 154 lb (69.9 kg)   SpO2 96%   BMI 22.10 kg/m    General: Well developed, well nourished,male appearing in no  acute distress. Head: Normocephalic, atraumatic, sclera non-icteric.  Neck: No carotid bruits. JVD not elevated.  Lungs: Respirations regular and unlabored, without wheezes or rales.  Heart: Regular rate and rhythm. No S3 or S4.  No murmur, no rubs, or gallops appreciated. Sternal incision appears well healing without erythema or drainage.  Abdomen: Soft, non-tender, non-distended. No obvious abdominal masses. Msk:  Strength and tone appear normal for age. No obvious joint deformities or effusions. Extremities: No clubbing or cyanosis. No lower extremity edema.  Distal pedal pulses are 2+ bilaterally. Neuro: Alert and oriented X 3. Moves all extremities spontaneously. No focal deficits noted. Psych:  Responds to questions appropriately with a normal affect. Skin: No rashes or lesions noted  Wt Readings from Last 3 Encounters:  02/11/20 154 lb (69.9 kg)  02/10/20 151 lb 9.6 oz (68.8 kg)  02/01/20 175 lb 0.7 oz (79.4 kg)     Studies/Labs Reviewed:   EKG:  EKG is not ordered today. EKG from 02/01/2020 is reviewed which shows NSR, HR 92 with no acute ST abnormalities.   Recent Labs: 01/15/2020: TSH 1.84 01/22/2020: ALT 22 01/27/2020: Magnesium 2.2 02/01/2020: BUN 17; Creatinine, Ser 0.71; Hemoglobin 11.8; Platelets 246; Potassium 4.5; Sodium 134   Lipid Panel    Component Value Date/Time   CHOL 174 01/06/2020 1008   TRIG 73 01/06/2020 1008   HDL 60 01/06/2020 1008   CHOLHDL 2.9 01/06/2020 1008   VLDL 15 01/06/2020 1008   LDLCALC 99 01/06/2020 1008    Additional studies/ records that were reviewed today include:   Echocardiogram: 10/2019 IMPRESSIONS    1. Left ventricular ejection fraction, by visual estimation,  is 60 to  65%. The left ventricle has normal function. There is no left ventricular  hypertrophy.  2. The left ventricle has no regional wall motion abnormalities.  3. Left ventricular diastolic parameters are consistent with Grade I  diastolic dysfunction (impaired  relaxation).  4. Global right ventricle has normal systolic function.The right  ventricular size is normal. No increase in right ventricular wall  thickness.  5. Left atrial size was normal.  6. Normal pulmonary artery systolic pressure.  7. Negative saline contrast study.   Event Monitor: 01/2020  Sinus rhythm and sinus tachycardia. No significant arrhythmias.  Cardiac Catheterization: 01/2020  Ost LAD to Prox LAD lesion is 75% stenosed. Prox LAD to Mid LAD lesion is 55% stenosed -this segment was considered significant by CT FFR  Mid LAD to Dist LAD lesion is 40% stenosed.  Dist (apical) LAD lesion is 75% stenosed.  Prox Cx to Dist Cx lesion is 65% stenosed. Long irregular lesion-significant by CT FFR  Prox RCA to Mid RCA lesion is 75% stenosed. Mid RCA lesion is 80% stenosed -lesions in tandem significant by CT FFR  Dist RCA-1 lesion is 55% stenosed.  Dist RCA-2 lesion is 80% stenosed with 80% stenosed side branch in RPDA. Significant by CT FFR  LV end diastolic pressure is mildly elevated.  There is no aortic valve stenosis.    Severe multivessel CAD involving the proximal third of the LAD, mid LCx and mid-distal RCA confirming findings seen on coronary CTA -> recommend CABG  Mildly elevated LVEDP   We will discharge home after bedrest.  He has outpatient CVTS consultation scheduled. Will need continued aggressive cardiovascular risk factor modification with guideline directed medical therapy.    Assessment:    1. Coronary artery disease involving native coronary artery of native heart without angina pectoris   2. Acute deep vein thrombosis (DVT) of left upper extremity, unspecified vein (HCC)   3. Hyperlipidemia LDL goal <70   4. Essential hypertension   5. Left carotid artery occlusion      Plan:   In order of problems listed above:  1. CAD - recent catheterization confirmed multivessel CAD and he is now s/p CABG x4 with LIMA-LAD, SVG-OM,  SVG-PDA and SVG-D1.  - he reports significant improvement in his dyspnea and exertional symptoms. Sternal pain continues to improve. We did review cardiac rehab and he expresses interest in participating once able to do so. - continue current medication regimen with ASA 81 mg daily, Atorvastatin 80 mg daily, Zetia 10 mg daily, and Lopressor 25 mg twice daily. He is no longer on Plavix given the indication for anticoagulation as outlined below.   2. Acute DVT of Left Upper Extremity  - recent doppler study showed extensive deep and superficial venous thrombosis throughout the left upper extremity. He reports significant improvement in the swelling along his left arm. Continue Eliquis for anticoagulation.   3. HLD - LDL was elevated to 99 when checked in 01/2020 and his goal is less than 51 with known CAD and prior CVA. He is currently on Atorvastatin '80mg'$  daily and Zetia '10mg'$  daily. Will recheck FLP and LFT's in 6-8 weeks. If not at goal, would consider switching Atorvastatin to Crestor or referral to the [facility] for initiation of PCSK-9 inhibitor therapy.   4. HTN - BP is well-controlled at 106/56 during today's visit. Continue Lopressor '25mg'$  BID. He denies any symptoms at this time but we reviewed if he developed any progressive dizziness, would reduce Lopressor to 12.'5mg'$   BID.   5. Carotid Artery Stenosis - he has a known chronic occlusion of the LICA. Recent dopplers in 10/2019 showed this along with less than 50% stenosis along the RICA.  - continue ASA and statin therapy.    Medication Adjustments/Labs and Tests Ordered: Current medicines are reviewed at length with the patient today.  Concerns regarding medicines are outlined above.  Medication changes, Labs and Tests ordered today are listed in the Patient Instructions below. Patient Instructions  Medication Instructions:  Your physician recommends that you continue on your current medications as directed. Please refer to the Current  Medication list given to you today.   *If you need a refill on your cardiac medications before your next appointment, please call your pharmacy*   Lab Work: Your physician recommends that you return for lab work in: 6-8 Weeks   If you have labs (blood work) drawn today and your tests are completely normal, you will receive your results only by: Marland Kitchen MyChart Message (if you have MyChart) OR . A paper copy in the mail If you have any lab test that is abnormal or we need to change your treatment, we will call you to review the results.   Testing/Procedures: NONE    Follow-Up: At [facility], you and your health needs are our priority.  As part of our continuing mission to provide you with exceptional heart care, we have created designated Provider Care Teams.  These Care Teams include your primary Cardiologist (physician) and Advanced Practice Providers (APPs -  Physician Assistants and Nurse Practitioners) who all work together to provide you with the care you need, when you need it.  We recommend signing up for the patient portal called "MyChart".  Sign up information is provided on this After Visit Summary.  MyChart is used to connect with patients for Virtual Visits (Telemedicine).  Patients are able to view lab/test results, encounter notes, upcoming appointments, etc.  Non-urgent messages can be sent to your provider as well.   To learn more about what you can do with MyChart, go to [email or web address].    Your next appointment:   3 month(s)  The format for your next appointment:   In Person  Provider:   Kate Sable, MD or Bernerd Pho, PA-C   Other Instructions Thank you for choosing [facility]!       Signed, Erma Heritage, PA-C  02/11/2020 8:14 PM    [place] [address] [place], [place] [address] Phone: [phone] Fax: [phone]

## 2020-02-10 NOTE — Progress Notes (Signed)
[address]       ,[place] [address]             [phone]       HPI: Mr. Mark Foster returns for a scheduled follow-up visit  Mark Foster is a 64 year old male with a history of strokes in 2011 and 2021, left ICA occlusion, pancreatic cancer, distal pancreatectomy and splenectomy, type 2 insulin-dependent diabetes with neuropathy, cervical spinal stenosis, hypothyroidism, syncope, and coronary artery disease.  He underwent coronary artery bypass grafting x4 on 01/26/2020.  Postoperatively he did not have any major complications.  He was having a fair amount of pain and he did have some issues with hyperglycemia.  He was discharged on postoperative day #5.  He presented back to the emergency room couple of days later with left arm swelling.  He was found to have a left subclavian DVT.  He was treated with Eliquis.  He does have a Port-A-Cath in place in the left subclavian vein from his previous chemotherapy.  He is feeling better.  He still is tired and does not have a lot of energy.  He does still have some incisional pain.  He has been using gabapentin and occasional tramadol for that.  He is using oxycodone 1 tablet at night before he goes to bed.  The swelling and pain in his left arm have improved significantly.  He has not had any recurrent angina or shortness of breath.  Past Medical History:  Diagnosis Date  . Carotid artery obstruction, left    Left ICA occlusion  . Cervical compression fracture ([place])   . Cervical spinal stenosis   . Coronary artery disease   . Diabetic neuropathy (HCC)    Bilateral legs  . Diverticulitis   . Family history of pancreatic cancer   . History of kidney stones   . Hypothyroidism   . Pancreatic cancer ([place])   . Stenosis of left vertebral artery   . Stroke (cerebrum) ([place]) 10/09/2019  . Stroke [facility])    2011  . Type 2 diabetes mellitus ([place])     Current Outpatient Medications  Medication Sig Dispense Refill  .  acetaminophen (TYLENOL) 500 MG tablet Take 2 tablets (1,000 mg total) by mouth every 6 (six) hours. 30 tablet 0  . apixaban (ELIQUIS) 5 MG TABS tablet Take 1 tablet (5 mg total) by mouth 2 (two) times daily. 60 tablet 0  . aspirin EC 81 MG EC tablet Take 1 tablet (81 mg total) by mouth daily.    Marland Kitchen atorvastatin (LIPITOR) 80 MG tablet TAKE 1 TABLET(80 MG) BY MOUTH DAILY AT 6 PM (Patient taking differently: Take 80 mg by mouth daily at 6 PM. ) 90 tablet 0  . cephALEXin (KEFLEX) 500 MG capsule Take 1 capsule (500 mg total) by mouth 3 (three) times daily. 21 capsule 0  . clopidogrel (PLAVIX) 75 MG tablet Take 1 tablet (75 mg total) by mouth daily. 60 tablet 1  . ezetimibe (ZETIA) 10 MG tablet Take 1 tablet (10 mg total) by mouth daily. 30 tablet 6  . gabapentin (NEURONTIN) 300 MG capsule Take 1 capsule (300 mg total) by mouth 2 (two) times daily. 180 capsule 0  . insulin aspart (NOVOLOG) 100 UNIT/ML FlexPen Inject 6 Units into the skin 3 (three) times daily with meals. (Patient taking differently: Inject 6-10 Units into the skin 3 (three) times daily with meals. ) 15 mL 0  . insulin glargine (LANTUS SOLOSTAR) 100 UNIT/ML Solostar Pen  Inject 36 Units into the skin at bedtime. 5 pen 2  . Insulin Pen Needle 32G X 4 MM MISC 1 Device by Does not apply route daily. 50 each 2  . levothyroxine (SYNTHROID) 50 MCG tablet Take 1 tablet (50 mcg total) by mouth daily. 90 tablet 1  . lidocaine-prilocaine (EMLA) cream Apply 1 application topically daily as needed (prior to port being accessed (~every 3 months)).    Marland Kitchen meclizine (ANTIVERT) 25 MG tablet Take 1 tablet (25 mg total) by mouth 3 (three) times daily as needed for dizziness. 30 tablet 0  . metoprolol tartrate (LOPRESSOR) 25 MG tablet Take 1 tablet (25 mg total) by mouth 2 (two) times daily. 180 tablet 3  . Omega-3 1000 MG CAPS Take 1,000 mg by mouth daily.    Marland Kitchen oxyCODONE (OXY IR/ROXICODONE) 5 MG immediate release tablet Take 1 tablet (5 mg total) by mouth every  6 (six) hours as needed for severe pain. 20 tablet 0  . sertraline (ZOLOFT) 50 MG tablet TAKE 1 TABLET BY MOUTH DAILY (Patient taking differently: Take 50 mg by mouth daily. ) 90 tablet 1  . tamsulosin (FLOMAX) 0.4 MG CAPS capsule Take 1 capsule (0.4 mg total) by mouth daily. 90 capsule 3  . vitamin B-12 (CYANOCOBALAMIN) 1000 MCG tablet Take 1,000 mcg by mouth daily.     No current facility-administered medications for this visit.   Facility-Administered Medications Ordered in Other Visits  Medication Dose Route Frequency Provider Last Rate Last Admin  . 0.9 %  sodium chloride infusion   Intravenous Continuous Derek Jack, MD 20 mL/hr at 12/30/18 1351 New Bag at 12/30/18 1351  . 0.9 %  sodium chloride infusion   Intravenous Continuous Derek Jack, MD 20 mL/hr at 12/30/18 1401 New Bag at 12/30/18 1401  . dextrose 5 % solution   Intravenous Once Derek Jack, MD      . heparin lock flush 100 unit/mL  500 Units Intracatheter Once PRN Derek Jack, MD      . sodium chloride flush (NS) 0.9 % injection 10 mL  10 mL Intracatheter PRN Derek Jack, MD   10 mL at 12/30/18 0911  . sodium chloride flush (NS) 0.9 % injection 10 mL  10 mL Intracatheter PRN Derek Jack, MD   10 mL at 05/21/19 0845    Physical Exam BP 116/64 (BP Location: Right Arm, Patient Position: Sitting, Cuff Size: Normal)   Pulse (!) 110 Comment: regular  Temp (!) 97.2 F (36.2 C) (Temporal)   Resp (!) 24   Ht 5\' 10"  (1.778 m)   Wt 151 lb 9.6 oz (68.8 kg)   SpO2 97% Comment: RA  BMI 21.41 kg/m  64 year old man in no acute distress Alert and oriented x3 with no focal deficits Lungs diminished bilaterally but otherwise clear with no rales or wheezing Cardiac mildly tachycardic, regular, no rub Sternum stable, incision intact No peripheral edema, leg incisions healing well  Diagnostic Tests: CHEST - 2 VIEW  COMPARISON:  Feb 01, 2020  FINDINGS: Small left pleural  effusion with mild left base atelectasis. Lungs otherwise are clear. Heart size and pulmonary vascularity are normal. Patient is status post coronary artery bypass grafting. Port-A-Cath tip is in the superior vena cava. No pneumothorax. There is aortic atherosclerosis. No bone lesions.  IMPRESSION: Small left pleural effusion with mild left base atelectasis. Lungs elsewhere clear. Heart size normal. Status post coronary artery bypass grafting. Port-A-Cath tip in superior vena cava.  Aortic Atherosclerosis (ICD10-I70.0).   Electronically Signed  By: Lowella Grip III M.D.   On: 02/10/2020 13:13 I personally reviewed the chest x-ray images and concur with the findings noted above, tiny left effusion that is improved from his PA and lateral prior to discharge.  Impression: Mark Foster is a 64 year old gentleman with a history of strokes in 2011 and 2021, left ICA occlusion, pancreatic cancer, distal pancreatectomy and splenectomy, type 2 insulin-dependent diabetes with neuropathy, cervical spinal stenosis, hypothyroidism, syncope, and coronary artery disease.  He underwent coronary artery bypass grafting x4 on 01/26/2020.  He currently is about 2 weeks out from surgery.  He still has some fatigue and lack of energy.  That is very common at this time point.  Usually between 4 to 6 weeks postoperatively when that starts to improve.  It could take a little longer in his case given his comorbidities.  He should not drive a car.  I did encourage him to walk and gradually increase his exertion.  He should not lift anything over 10 pounds for least another month.  He is running low on oxycodone.  He is currently only taking about 1 of those a day.  I went ahead and refilled his prescription for oxycodone 5 mg tablets, 1 every 6 hours as needed, 20 tablets, no refills.  Left upper extremity DVT-pain and swelling improved with anticoagulation.  Subclavian thrombus in setting of indwelling  Port-A-Cath.  I think that Port-A-Cath likely is going to need to come out at some point, but would not recommend that currently.  Continue with anticoagulation.  Plan: Return in 2 weeks.  No chest x-ray needed unless symptoms develop.  Melrose Nakayama, MD [facility] [phone]

## 2020-02-11 ENCOUNTER — Other Ambulatory Visit: Payer: Self-pay

## 2020-02-11 ENCOUNTER — Encounter: Payer: Self-pay | Admitting: Student

## 2020-02-11 ENCOUNTER — Ambulatory Visit: Payer: Medicare HMO | Admitting: Student

## 2020-02-11 VITALS — BP 106/56 | HR 90 | Ht 70.0 in | Wt 154.0 lb

## 2020-02-11 DIAGNOSIS — I2510 Atherosclerotic heart disease of native coronary artery without angina pectoris: Secondary | ICD-10-CM

## 2020-02-11 DIAGNOSIS — Z48812 Encounter for surgical aftercare following surgery on the circulatory system: Secondary | ICD-10-CM | POA: Diagnosis not present

## 2020-02-11 DIAGNOSIS — I10 Essential (primary) hypertension: Secondary | ICD-10-CM | POA: Diagnosis not present

## 2020-02-11 DIAGNOSIS — E782 Mixed hyperlipidemia: Secondary | ICD-10-CM | POA: Diagnosis not present

## 2020-02-11 DIAGNOSIS — I82612 Acute embolism and thrombosis of superficial veins of left upper extremity: Secondary | ICD-10-CM | POA: Diagnosis not present

## 2020-02-11 DIAGNOSIS — E1140 Type 2 diabetes mellitus with diabetic neuropathy, unspecified: Secondary | ICD-10-CM | POA: Diagnosis not present

## 2020-02-11 DIAGNOSIS — I82622 Acute embolism and thrombosis of deep veins of left upper extremity: Secondary | ICD-10-CM | POA: Diagnosis not present

## 2020-02-11 DIAGNOSIS — E785 Hyperlipidemia, unspecified: Secondary | ICD-10-CM

## 2020-02-11 DIAGNOSIS — I6522 Occlusion and stenosis of left carotid artery: Secondary | ICD-10-CM | POA: Diagnosis not present

## 2020-02-11 DIAGNOSIS — E039 Hypothyroidism, unspecified: Secondary | ICD-10-CM | POA: Diagnosis not present

## 2020-02-11 DIAGNOSIS — M4802 Spinal stenosis, cervical region: Secondary | ICD-10-CM | POA: Diagnosis not present

## 2020-02-11 IMAGING — DX CHEST - 2 VIEW
2 series · 2 of 2 positions shown · non-contrast
Comparison: None.

CLINICAL DATA: Weakness.

EXAM:
CHEST - 2 VIEW

[chest lat]
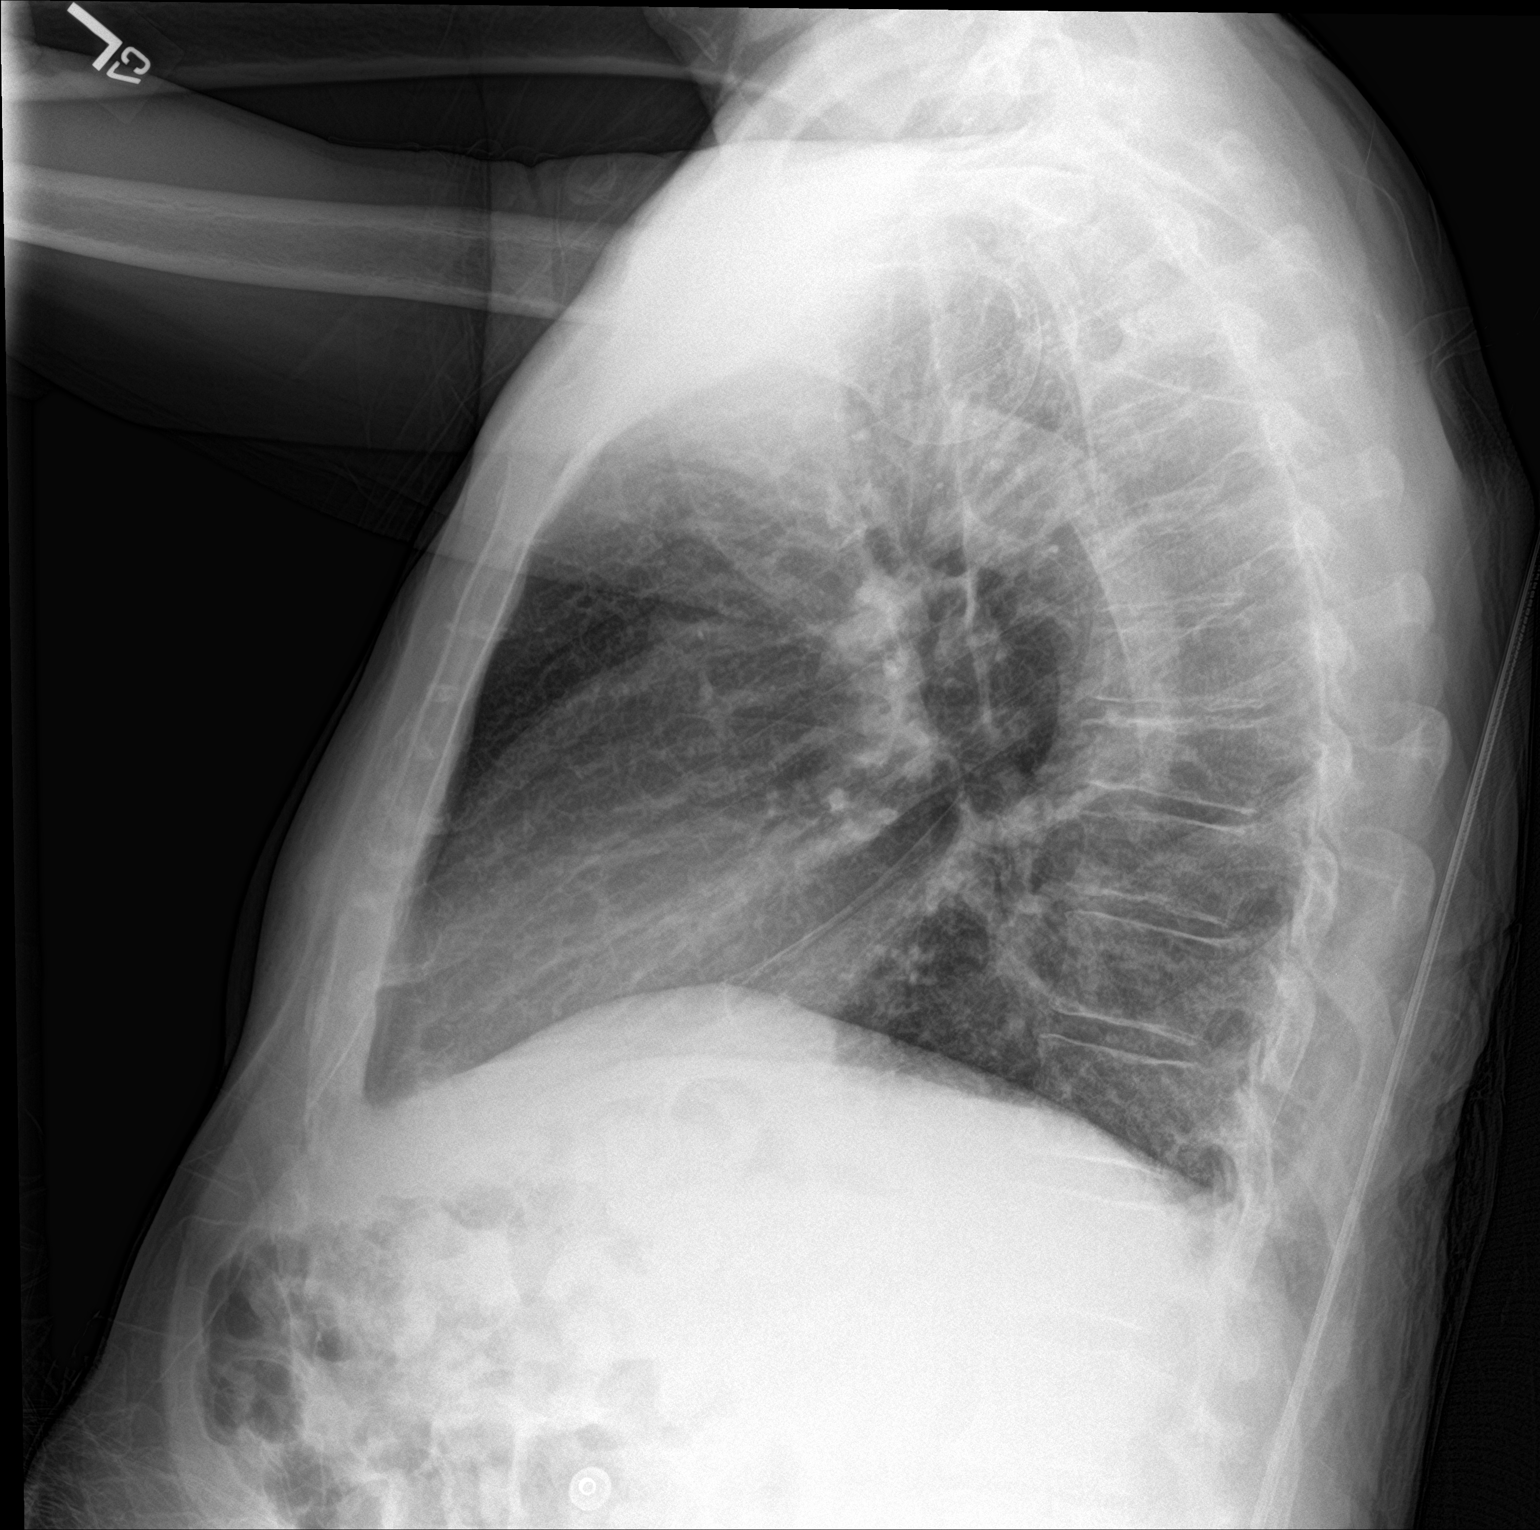

[chest ap]
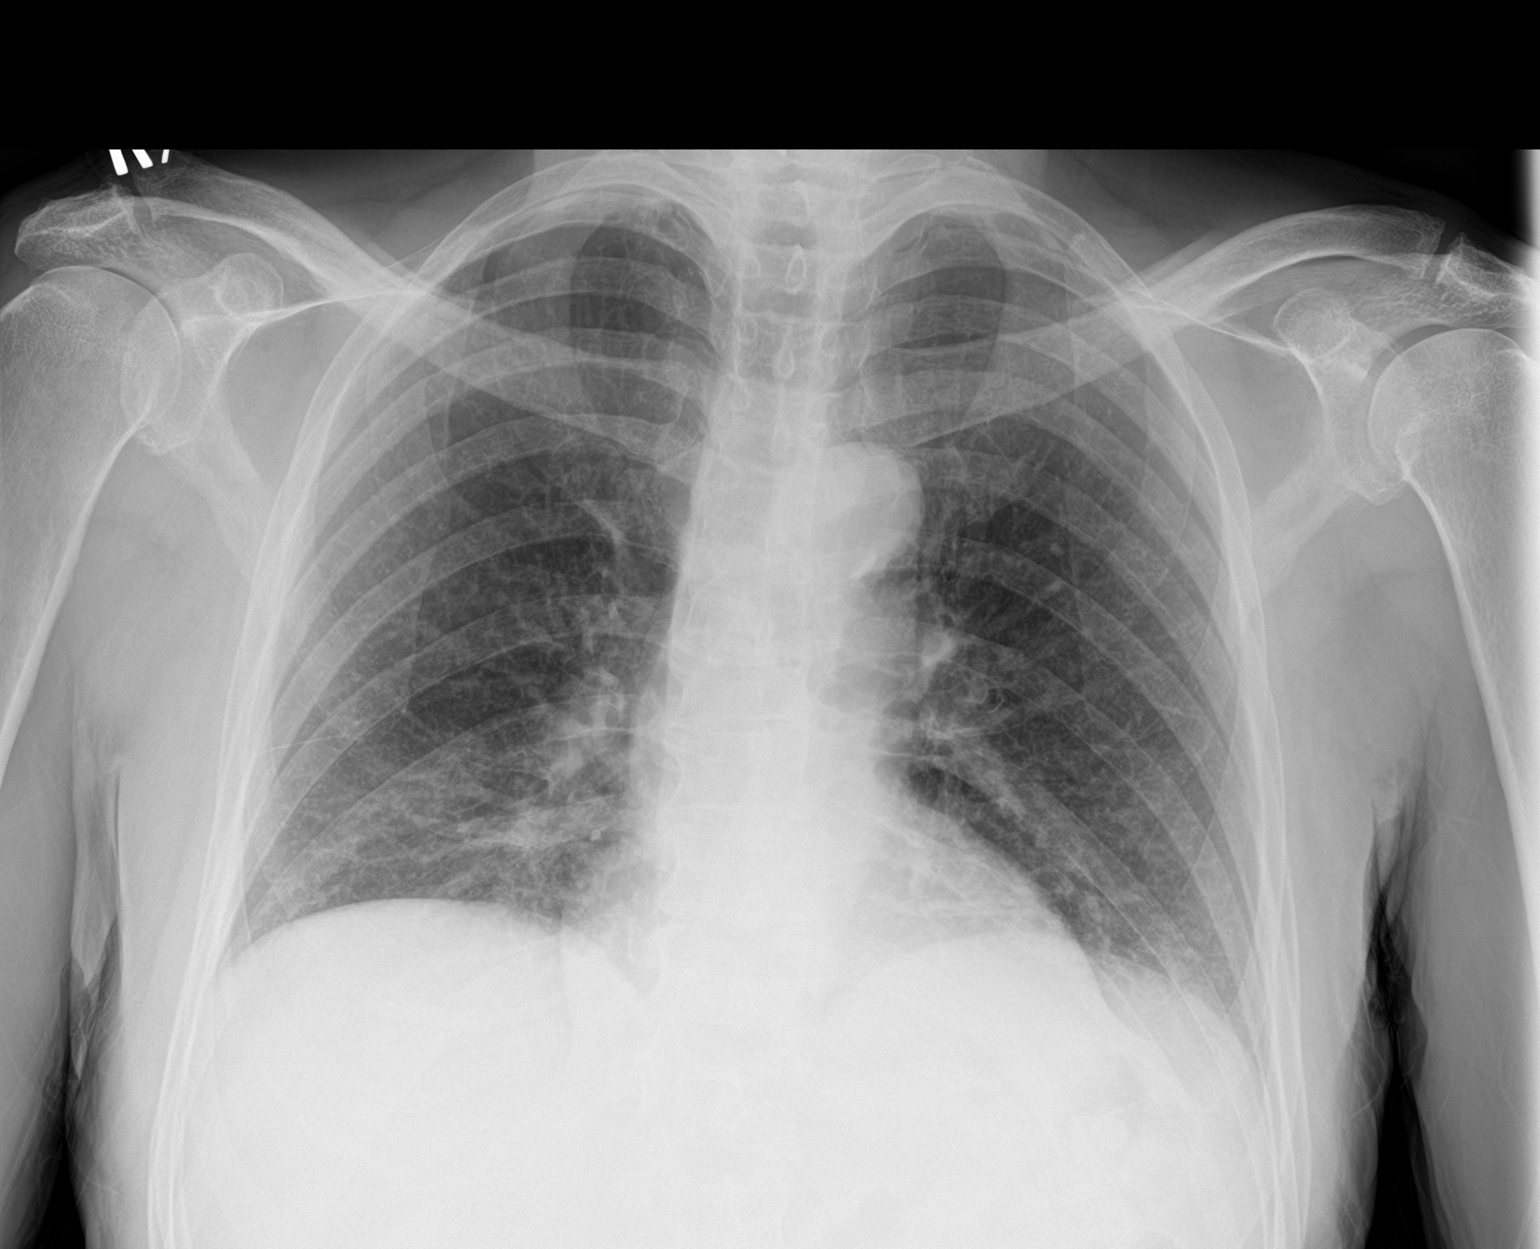

[2 of 2 positions shown; findings below may reference images not displayed]

FINDINGS: The heart size and mediastinal contours are within normal limits.
Both lungs are clear. The visualized skeletal structures are
unremarkable.
IMPRESSION: No active cardiopulmonary disease.

## 2020-02-11 NOTE — Patient Instructions (Signed)
Medication Instructions:  Your physician recommends that you continue on your current medications as directed. Please refer to the Current Medication list given to you today.   *If you need a refill on your cardiac medications before your next appointment, please call your pharmacy*   Lab Work: Your physician recommends that you return for lab work in: 6-8 Weeks   If you have labs (blood work) drawn today and your tests are completely normal, you will receive your results only by:  [place] (if you have MyChart) OR  A paper copy in the mail If you have any lab test that is abnormal or we need to change your treatment, we will call you to review the results.   Testing/Procedures: NONE    Follow-Up: At [facility], you and your health needs are our priority.  As part of our continuing mission to provide you with exceptional heart care, we have created designated Provider Care Teams.  These Care Teams include your primary Cardiologist (physician) and Advanced Practice Providers (APPs -  Physician Assistants and Nurse Practitioners) who all work together to provide you with the care you need, when you need it.  We recommend signing up for the patient portal called "MyChart".  Sign up information is provided on this After Visit Summary.  MyChart is used to connect with patients for Virtual Visits (Telemedicine).  Patients are able to view lab/test results, encounter notes, upcoming appointments, etc.  Non-urgent messages can be sent to your provider as well.   To learn more about what you can do with MyChart, go to [email or web address].    Your next appointment:   3 month(s)  The format for your next appointment:   In Person  Provider:   Kate Sable, MD or Bernerd Pho, PA-C   Other Instructions Thank you for choosing [facility]!

## 2020-02-12 DIAGNOSIS — I82622 Acute embolism and thrombosis of deep veins of left upper extremity: Secondary | ICD-10-CM | POA: Diagnosis not present

## 2020-02-12 DIAGNOSIS — E782 Mixed hyperlipidemia: Secondary | ICD-10-CM | POA: Diagnosis not present

## 2020-02-12 DIAGNOSIS — I2510 Atherosclerotic heart disease of native coronary artery without angina pectoris: Secondary | ICD-10-CM | POA: Diagnosis not present

## 2020-02-12 DIAGNOSIS — I82612 Acute embolism and thrombosis of superficial veins of left upper extremity: Secondary | ICD-10-CM | POA: Diagnosis not present

## 2020-02-12 DIAGNOSIS — I10 Essential (primary) hypertension: Secondary | ICD-10-CM | POA: Diagnosis not present

## 2020-02-12 DIAGNOSIS — E1140 Type 2 diabetes mellitus with diabetic neuropathy, unspecified: Secondary | ICD-10-CM | POA: Diagnosis not present

## 2020-02-12 DIAGNOSIS — E039 Hypothyroidism, unspecified: Secondary | ICD-10-CM | POA: Diagnosis not present

## 2020-02-12 DIAGNOSIS — M4802 Spinal stenosis, cervical region: Secondary | ICD-10-CM | POA: Diagnosis not present

## 2020-02-12 DIAGNOSIS — Z48812 Encounter for surgical aftercare following surgery on the circulatory system: Secondary | ICD-10-CM | POA: Diagnosis not present

## 2020-02-13 DIAGNOSIS — I2510 Atherosclerotic heart disease of native coronary artery without angina pectoris: Secondary | ICD-10-CM | POA: Diagnosis not present

## 2020-02-13 DIAGNOSIS — M4802 Spinal stenosis, cervical region: Secondary | ICD-10-CM | POA: Diagnosis not present

## 2020-02-13 DIAGNOSIS — I82612 Acute embolism and thrombosis of superficial veins of left upper extremity: Secondary | ICD-10-CM | POA: Diagnosis not present

## 2020-02-13 DIAGNOSIS — I10 Essential (primary) hypertension: Secondary | ICD-10-CM | POA: Diagnosis not present

## 2020-02-13 DIAGNOSIS — E039 Hypothyroidism, unspecified: Secondary | ICD-10-CM | POA: Diagnosis not present

## 2020-02-13 DIAGNOSIS — E1140 Type 2 diabetes mellitus with diabetic neuropathy, unspecified: Secondary | ICD-10-CM | POA: Diagnosis not present

## 2020-02-13 DIAGNOSIS — Z48812 Encounter for surgical aftercare following surgery on the circulatory system: Secondary | ICD-10-CM | POA: Diagnosis not present

## 2020-02-13 DIAGNOSIS — I82622 Acute embolism and thrombosis of deep veins of left upper extremity: Secondary | ICD-10-CM | POA: Diagnosis not present

## 2020-02-13 DIAGNOSIS — E782 Mixed hyperlipidemia: Secondary | ICD-10-CM | POA: Diagnosis not present

## 2020-02-16 DIAGNOSIS — I10 Essential (primary) hypertension: Secondary | ICD-10-CM | POA: Diagnosis not present

## 2020-02-16 DIAGNOSIS — E782 Mixed hyperlipidemia: Secondary | ICD-10-CM | POA: Diagnosis not present

## 2020-02-16 DIAGNOSIS — M4802 Spinal stenosis, cervical region: Secondary | ICD-10-CM | POA: Diagnosis not present

## 2020-02-16 DIAGNOSIS — E039 Hypothyroidism, unspecified: Secondary | ICD-10-CM | POA: Diagnosis not present

## 2020-02-16 DIAGNOSIS — I82612 Acute embolism and thrombosis of superficial veins of left upper extremity: Secondary | ICD-10-CM | POA: Diagnosis not present

## 2020-02-16 DIAGNOSIS — I82622 Acute embolism and thrombosis of deep veins of left upper extremity: Secondary | ICD-10-CM | POA: Diagnosis not present

## 2020-02-16 DIAGNOSIS — I2510 Atherosclerotic heart disease of native coronary artery without angina pectoris: Secondary | ICD-10-CM | POA: Diagnosis not present

## 2020-02-16 DIAGNOSIS — Z48812 Encounter for surgical aftercare following surgery on the circulatory system: Secondary | ICD-10-CM | POA: Diagnosis not present

## 2020-02-16 DIAGNOSIS — E1140 Type 2 diabetes mellitus with diabetic neuropathy, unspecified: Secondary | ICD-10-CM | POA: Diagnosis not present

## 2020-02-17 DIAGNOSIS — E039 Hypothyroidism, unspecified: Secondary | ICD-10-CM | POA: Diagnosis not present

## 2020-02-17 DIAGNOSIS — E782 Mixed hyperlipidemia: Secondary | ICD-10-CM | POA: Diagnosis not present

## 2020-02-17 DIAGNOSIS — E1140 Type 2 diabetes mellitus with diabetic neuropathy, unspecified: Secondary | ICD-10-CM | POA: Diagnosis not present

## 2020-02-17 DIAGNOSIS — I10 Essential (primary) hypertension: Secondary | ICD-10-CM | POA: Diagnosis not present

## 2020-02-17 DIAGNOSIS — I82622 Acute embolism and thrombosis of deep veins of left upper extremity: Secondary | ICD-10-CM | POA: Diagnosis not present

## 2020-02-17 DIAGNOSIS — M4802 Spinal stenosis, cervical region: Secondary | ICD-10-CM | POA: Diagnosis not present

## 2020-02-17 DIAGNOSIS — I2510 Atherosclerotic heart disease of native coronary artery without angina pectoris: Secondary | ICD-10-CM | POA: Diagnosis not present

## 2020-02-17 DIAGNOSIS — I82612 Acute embolism and thrombosis of superficial veins of left upper extremity: Secondary | ICD-10-CM | POA: Diagnosis not present

## 2020-02-17 DIAGNOSIS — Z48812 Encounter for surgical aftercare following surgery on the circulatory system: Secondary | ICD-10-CM | POA: Diagnosis not present

## 2020-02-18 DIAGNOSIS — E782 Mixed hyperlipidemia: Secondary | ICD-10-CM | POA: Diagnosis not present

## 2020-02-18 DIAGNOSIS — E039 Hypothyroidism, unspecified: Secondary | ICD-10-CM | POA: Diagnosis not present

## 2020-02-18 DIAGNOSIS — M4802 Spinal stenosis, cervical region: Secondary | ICD-10-CM | POA: Diagnosis not present

## 2020-02-18 DIAGNOSIS — Z48812 Encounter for surgical aftercare following surgery on the circulatory system: Secondary | ICD-10-CM | POA: Diagnosis not present

## 2020-02-18 DIAGNOSIS — I82622 Acute embolism and thrombosis of deep veins of left upper extremity: Secondary | ICD-10-CM | POA: Diagnosis not present

## 2020-02-18 DIAGNOSIS — I2510 Atherosclerotic heart disease of native coronary artery without angina pectoris: Secondary | ICD-10-CM | POA: Diagnosis not present

## 2020-02-18 DIAGNOSIS — E1140 Type 2 diabetes mellitus with diabetic neuropathy, unspecified: Secondary | ICD-10-CM | POA: Diagnosis not present

## 2020-02-18 DIAGNOSIS — I82612 Acute embolism and thrombosis of superficial veins of left upper extremity: Secondary | ICD-10-CM | POA: Diagnosis not present

## 2020-02-18 DIAGNOSIS — I10 Essential (primary) hypertension: Secondary | ICD-10-CM | POA: Diagnosis not present

## 2020-02-20 DIAGNOSIS — Z48812 Encounter for surgical aftercare following surgery on the circulatory system: Secondary | ICD-10-CM | POA: Diagnosis not present

## 2020-02-20 DIAGNOSIS — E1140 Type 2 diabetes mellitus with diabetic neuropathy, unspecified: Secondary | ICD-10-CM | POA: Diagnosis not present

## 2020-02-20 DIAGNOSIS — I2510 Atherosclerotic heart disease of native coronary artery without angina pectoris: Secondary | ICD-10-CM | POA: Diagnosis not present

## 2020-02-20 DIAGNOSIS — E782 Mixed hyperlipidemia: Secondary | ICD-10-CM | POA: Diagnosis not present

## 2020-02-20 DIAGNOSIS — I82622 Acute embolism and thrombosis of deep veins of left upper extremity: Secondary | ICD-10-CM | POA: Diagnosis not present

## 2020-02-20 DIAGNOSIS — I10 Essential (primary) hypertension: Secondary | ICD-10-CM | POA: Diagnosis not present

## 2020-02-20 DIAGNOSIS — I82612 Acute embolism and thrombosis of superficial veins of left upper extremity: Secondary | ICD-10-CM | POA: Diagnosis not present

## 2020-02-20 DIAGNOSIS — E039 Hypothyroidism, unspecified: Secondary | ICD-10-CM | POA: Diagnosis not present

## 2020-02-20 DIAGNOSIS — M4802 Spinal stenosis, cervical region: Secondary | ICD-10-CM | POA: Diagnosis not present

## 2020-02-23 DIAGNOSIS — I82622 Acute embolism and thrombosis of deep veins of left upper extremity: Secondary | ICD-10-CM | POA: Diagnosis not present

## 2020-02-23 DIAGNOSIS — I82612 Acute embolism and thrombosis of superficial veins of left upper extremity: Secondary | ICD-10-CM | POA: Diagnosis not present

## 2020-02-23 DIAGNOSIS — E782 Mixed hyperlipidemia: Secondary | ICD-10-CM | POA: Diagnosis not present

## 2020-02-23 DIAGNOSIS — I10 Essential (primary) hypertension: Secondary | ICD-10-CM | POA: Diagnosis not present

## 2020-02-23 DIAGNOSIS — E1140 Type 2 diabetes mellitus with diabetic neuropathy, unspecified: Secondary | ICD-10-CM | POA: Diagnosis not present

## 2020-02-23 DIAGNOSIS — I2510 Atherosclerotic heart disease of native coronary artery without angina pectoris: Secondary | ICD-10-CM | POA: Diagnosis not present

## 2020-02-23 DIAGNOSIS — M4802 Spinal stenosis, cervical region: Secondary | ICD-10-CM | POA: Diagnosis not present

## 2020-02-23 DIAGNOSIS — E039 Hypothyroidism, unspecified: Secondary | ICD-10-CM | POA: Diagnosis not present

## 2020-02-23 DIAGNOSIS — Z48812 Encounter for surgical aftercare following surgery on the circulatory system: Secondary | ICD-10-CM | POA: Diagnosis not present

## 2020-02-24 ENCOUNTER — Ambulatory Visit: Payer: Medicare HMO | Admitting: Thoracic Surgery (Cardiothoracic Vascular Surgery)

## 2020-02-25 ENCOUNTER — Encounter: Payer: Self-pay | Admitting: Thoracic Surgery (Cardiothoracic Vascular Surgery)

## 2020-02-25 ENCOUNTER — Other Ambulatory Visit: Payer: Self-pay

## 2020-02-25 ENCOUNTER — Ambulatory Visit (INDEPENDENT_AMBULATORY_CARE_PROVIDER_SITE_OTHER): Payer: Self-pay | Admitting: Thoracic Surgery (Cardiothoracic Vascular Surgery)

## 2020-02-25 VITALS — BP 114/71 | HR 100 | Temp 98.1°F | Resp 20 | Ht 70.0 in | Wt 156.0 lb

## 2020-02-25 DIAGNOSIS — Z48812 Encounter for surgical aftercare following surgery on the circulatory system: Secondary | ICD-10-CM | POA: Diagnosis not present

## 2020-02-25 DIAGNOSIS — I82622 Acute embolism and thrombosis of deep veins of left upper extremity: Secondary | ICD-10-CM | POA: Diagnosis not present

## 2020-02-25 DIAGNOSIS — I10 Essential (primary) hypertension: Secondary | ICD-10-CM | POA: Diagnosis not present

## 2020-02-25 DIAGNOSIS — I2510 Atherosclerotic heart disease of native coronary artery without angina pectoris: Secondary | ICD-10-CM

## 2020-02-25 DIAGNOSIS — E039 Hypothyroidism, unspecified: Secondary | ICD-10-CM | POA: Diagnosis not present

## 2020-02-25 DIAGNOSIS — E782 Mixed hyperlipidemia: Secondary | ICD-10-CM | POA: Diagnosis not present

## 2020-02-25 DIAGNOSIS — I82612 Acute embolism and thrombosis of superficial veins of left upper extremity: Secondary | ICD-10-CM | POA: Diagnosis not present

## 2020-02-25 DIAGNOSIS — E1140 Type 2 diabetes mellitus with diabetic neuropathy, unspecified: Secondary | ICD-10-CM | POA: Diagnosis not present

## 2020-02-25 DIAGNOSIS — M4802 Spinal stenosis, cervical region: Secondary | ICD-10-CM | POA: Diagnosis not present

## 2020-02-25 DIAGNOSIS — Z951 Presence of aortocoronary bypass graft: Secondary | ICD-10-CM

## 2020-02-25 NOTE — Progress Notes (Signed)
[address]       [place] [address]             [phone]       HPI: Mr. Mark Foster returns for scheduled follow-up visit  Mark Foster is a 64 year old man with a history of strokes, left ICA occlusion, pancreatic cancer, distal pancreatectomy and splenectomy, type 2 insulin-dependent diabetes, diabetic neuropathy, cervical spinal stenosis, hypothyroidism, syncope, and coronary disease.  He had coronary bypass grafting x4 on 01/26/2020.  He presented back to the emergency room a couple days after he was discharged with left arm swelling.  He had a left subclavian DVT.  He was started on Eliquis.  I saw him in the office on 02/10/2020.  He was still feeling very tired and fatigued.   In the interim since his last visit he has feels much better.  He walked 2 miles yesterday.  He is not had any recurrent angina.  His energy levels are starting to return.  He is can start cardiac rehab probably next week.  Past Medical History:  Diagnosis Date  . CAD (coronary artery disease)    a. 01/2020: CABG x4 with LIMA-LAD, SVG-OM, SVG-PDA and SVG-D1  . Carotid artery obstruction, left    Left ICA occlusion  . Cervical compression fracture ([place])   . Cervical spinal stenosis   . Coronary artery disease   . Diabetic neuropathy (HCC)    Bilateral legs  . Diverticulitis   . Family history of pancreatic cancer   . History of kidney stones   . Hypothyroidism   . Pancreatic cancer ([place])   . Stenosis of left vertebral artery   . Stroke (cerebrum) ([place]) 10/09/2019  . Stroke [facility])    2011  . Type 2 diabetes mellitus ([place])     Current Outpatient Medications  Medication Sig Dispense Refill  . acetaminophen (TYLENOL) 500 MG tablet Take 2 tablets (1,000 mg total) by mouth every 6 (six) hours. 30 tablet 0  . apixaban (ELIQUIS) 5 MG TABS tablet Take 1 tablet (5 mg total) by mouth 2 (two) times daily. 60 tablet 0  . aspirin EC 81 MG EC tablet Take 1 tablet (81 mg total) by mouth  daily.    Marland Kitchen atorvastatin (LIPITOR) 80 MG tablet TAKE 1 TABLET(80 MG) BY MOUTH DAILY AT 6 PM (Patient taking differently: Take 80 mg by mouth daily at 6 PM. ) 90 tablet 0  . ezetimibe (ZETIA) 10 MG tablet Take 1 tablet (10 mg total) by mouth daily. 30 tablet 6  . gabapentin (NEURONTIN) 300 MG capsule Take 1 capsule (300 mg total) by mouth 2 (two) times daily. 180 capsule 0  . insulin aspart (NOVOLOG) 100 UNIT/ML FlexPen Inject 6 Units into the skin 3 (three) times daily with meals. (Patient taking differently: Inject 6-10 Units into the skin 3 (three) times daily with meals. ) 15 mL 0  . insulin glargine (LANTUS SOLOSTAR) 100 UNIT/ML Solostar Pen Inject 36 Units into the skin at bedtime. 5 pen 2  . Insulin Pen Needle 32G X 4 MM MISC 1 Device by Does not apply route daily. 50 each 2  . levothyroxine (SYNTHROID) 50 MCG tablet Take 1 tablet (50 mcg total) by mouth daily. 90 tablet 1  . lidocaine-prilocaine (EMLA) cream Apply 1 application topically daily as needed (prior to port being accessed (~every 3 months)).    Marland Kitchen meclizine (ANTIVERT) 25 MG tablet Take 1 tablet (25 mg total) by mouth 3 (three) times  daily as needed for dizziness. 30 tablet 0  . metoprolol tartrate (LOPRESSOR) 25 MG tablet Take 1 tablet (25 mg total) by mouth 2 (two) times daily. 180 tablet 3  . Omega-3 1000 MG CAPS Take 1,000 mg by mouth daily.    Marland Kitchen oxyCODONE (OXY IR/ROXICODONE) 5 MG immediate release tablet Take 1 tablet (5 mg total) by mouth every 6 (six) hours as needed for severe pain. 20 tablet 0  . sertraline (ZOLOFT) 50 MG tablet TAKE 1 TABLET BY MOUTH DAILY (Patient taking differently: Take 50 mg by mouth daily. ) 90 tablet 1  . tamsulosin (FLOMAX) 0.4 MG CAPS capsule Take 1 capsule (0.4 mg total) by mouth daily. 90 capsule 3  . vitamin B-12 (CYANOCOBALAMIN) 1000 MCG tablet Take 1,000 mcg by mouth daily.    . cephALEXin (KEFLEX) 500 MG capsule Take 1 capsule (500 mg total) by mouth 3 (three) times daily. (Patient not taking:  Reported on 02/25/2020) 21 capsule 0   No current facility-administered medications for this visit.   Facility-Administered Medications Ordered in Other Visits  Medication Dose Route Frequency Provider Last Rate Last Admin  . 0.9 %  sodium chloride infusion   Intravenous Continuous Mark Jack, MD 20 mL/hr at 12/30/18 1351 New Bag at 12/30/18 1351  . 0.9 %  sodium chloride infusion   Intravenous Continuous Mark Jack, MD 20 mL/hr at 12/30/18 1401 New Bag at 12/30/18 1401  . dextrose 5 % solution   Intravenous Once Mark Jack, MD      . heparin lock flush 100 unit/mL  500 Units Intracatheter Once PRN Mark Jack, MD      . sodium chloride flush (NS) 0.9 % injection 10 mL  10 mL Intracatheter PRN Mark Jack, MD   10 mL at 12/30/18 0911  . sodium chloride flush (NS) 0.9 % injection 10 mL  10 mL Intracatheter PRN Mark Jack, MD   10 mL at 05/21/19 0845    Physical Exam BP 114/71   Pulse 100   Temp 98.1 F (36.7 C) (Skin)   Resp 20   Ht 5\' 10"  (1.778 m)   Wt 156 lb (70.8 kg)   SpO2 96% Comment: RA  BMI 22.32 kg/m  64 year old man in no acute distress Alert and oriented x3 with no focal deficits Lungs clear with equal breath sounds bilaterally Cardiac regular rate and rhythm Sternum stable, incision healing well No edema   Impression: Mark Foster is a 64 year old gentleman with numerous medical problems including 2 previous strokes, pancreatic cancer, type 2 insulin-dependent diabetes, diabetic neuropathy, hypothyroidism, and syncope.  He had coronary bypass grafting x4 on 01/26/2020.  He had a DVT of his left subclavian vein shortly after discharge.  He is on Eliquis for that.  Otherwise his course been rather unremarkable particularly in light of his numerous medical problems.  He is doing well from a pain standpoint.  He is not taking any narcotics at this point.  His exercise tolerance is excellent.  He may begin driving.   Appropriate precautions were discussed.  He should not lift anything over 10 pounds for another 2 weeks.  I think he is fine to start cardiac rehab and is planning to do that next week.  Plan: Follow-up with Dr. Bronson Ing  I will be happy to see Mr. Gorniak back anytime in the future if I can be of any further assistance with his care  Melrose Nakayama, MD [facility] [phone]

## 2020-02-26 ENCOUNTER — Ambulatory Visit: Payer: Medicare HMO | Admitting: Cardiovascular Disease

## 2020-02-29 DIAGNOSIS — I82622 Acute embolism and thrombosis of deep veins of left upper extremity: Secondary | ICD-10-CM | POA: Diagnosis not present

## 2020-02-29 DIAGNOSIS — E782 Mixed hyperlipidemia: Secondary | ICD-10-CM | POA: Diagnosis not present

## 2020-02-29 DIAGNOSIS — M4802 Spinal stenosis, cervical region: Secondary | ICD-10-CM | POA: Diagnosis not present

## 2020-02-29 DIAGNOSIS — Z48812 Encounter for surgical aftercare following surgery on the circulatory system: Secondary | ICD-10-CM | POA: Diagnosis not present

## 2020-02-29 DIAGNOSIS — I10 Essential (primary) hypertension: Secondary | ICD-10-CM | POA: Diagnosis not present

## 2020-02-29 DIAGNOSIS — E039 Hypothyroidism, unspecified: Secondary | ICD-10-CM | POA: Diagnosis not present

## 2020-02-29 DIAGNOSIS — I82612 Acute embolism and thrombosis of superficial veins of left upper extremity: Secondary | ICD-10-CM | POA: Diagnosis not present

## 2020-02-29 DIAGNOSIS — I2510 Atherosclerotic heart disease of native coronary artery without angina pectoris: Secondary | ICD-10-CM | POA: Diagnosis not present

## 2020-02-29 DIAGNOSIS — E1140 Type 2 diabetes mellitus with diabetic neuropathy, unspecified: Secondary | ICD-10-CM | POA: Diagnosis not present

## 2020-03-03 ENCOUNTER — Other Ambulatory Visit (INDEPENDENT_AMBULATORY_CARE_PROVIDER_SITE_OTHER): Payer: Self-pay

## 2020-03-03 ENCOUNTER — Encounter (INDEPENDENT_AMBULATORY_CARE_PROVIDER_SITE_OTHER): Payer: Self-pay | Admitting: Nurse Practitioner

## 2020-03-03 ENCOUNTER — Other Ambulatory Visit (INDEPENDENT_AMBULATORY_CARE_PROVIDER_SITE_OTHER): Payer: Self-pay | Admitting: Internal Medicine

## 2020-03-03 DIAGNOSIS — Z48812 Encounter for surgical aftercare following surgery on the circulatory system: Secondary | ICD-10-CM | POA: Diagnosis not present

## 2020-03-03 DIAGNOSIS — I2510 Atherosclerotic heart disease of native coronary artery without angina pectoris: Secondary | ICD-10-CM | POA: Diagnosis not present

## 2020-03-03 DIAGNOSIS — E782 Mixed hyperlipidemia: Secondary | ICD-10-CM | POA: Diagnosis not present

## 2020-03-03 DIAGNOSIS — I10 Essential (primary) hypertension: Secondary | ICD-10-CM | POA: Diagnosis not present

## 2020-03-03 DIAGNOSIS — R5381 Other malaise: Secondary | ICD-10-CM

## 2020-03-03 DIAGNOSIS — E1140 Type 2 diabetes mellitus with diabetic neuropathy, unspecified: Secondary | ICD-10-CM | POA: Diagnosis not present

## 2020-03-03 DIAGNOSIS — I82622 Acute embolism and thrombosis of deep veins of left upper extremity: Secondary | ICD-10-CM | POA: Diagnosis not present

## 2020-03-03 DIAGNOSIS — I82612 Acute embolism and thrombosis of superficial veins of left upper extremity: Secondary | ICD-10-CM | POA: Diagnosis not present

## 2020-03-03 DIAGNOSIS — R269 Unspecified abnormalities of gait and mobility: Secondary | ICD-10-CM

## 2020-03-03 DIAGNOSIS — E039 Hypothyroidism, unspecified: Secondary | ICD-10-CM | POA: Diagnosis not present

## 2020-03-03 DIAGNOSIS — M4802 Spinal stenosis, cervical region: Secondary | ICD-10-CM | POA: Diagnosis not present

## 2020-03-03 DIAGNOSIS — R5383 Other fatigue: Secondary | ICD-10-CM

## 2020-03-03 NOTE — Progress Notes (Addendum)
Pt asked to be referred back to cardiac rehab.

## 2020-03-03 NOTE — Telephone Encounter (Signed)
Cand you add Dx and sign order? It is setup in chart.

## 2020-03-05 DIAGNOSIS — E1140 Type 2 diabetes mellitus with diabetic neuropathy, unspecified: Secondary | ICD-10-CM | POA: Diagnosis not present

## 2020-03-05 DIAGNOSIS — I10 Essential (primary) hypertension: Secondary | ICD-10-CM | POA: Diagnosis not present

## 2020-03-05 DIAGNOSIS — M4802 Spinal stenosis, cervical region: Secondary | ICD-10-CM | POA: Diagnosis not present

## 2020-03-05 DIAGNOSIS — I82622 Acute embolism and thrombosis of deep veins of left upper extremity: Secondary | ICD-10-CM | POA: Diagnosis not present

## 2020-03-05 DIAGNOSIS — E039 Hypothyroidism, unspecified: Secondary | ICD-10-CM | POA: Diagnosis not present

## 2020-03-05 DIAGNOSIS — E782 Mixed hyperlipidemia: Secondary | ICD-10-CM | POA: Diagnosis not present

## 2020-03-05 DIAGNOSIS — I82612 Acute embolism and thrombosis of superficial veins of left upper extremity: Secondary | ICD-10-CM | POA: Diagnosis not present

## 2020-03-05 DIAGNOSIS — I2510 Atherosclerotic heart disease of native coronary artery without angina pectoris: Secondary | ICD-10-CM | POA: Diagnosis not present

## 2020-03-05 DIAGNOSIS — Z48812 Encounter for surgical aftercare following surgery on the circulatory system: Secondary | ICD-10-CM | POA: Diagnosis not present

## 2020-03-12 DIAGNOSIS — E039 Hypothyroidism, unspecified: Secondary | ICD-10-CM | POA: Diagnosis not present

## 2020-03-12 DIAGNOSIS — I82622 Acute embolism and thrombosis of deep veins of left upper extremity: Secondary | ICD-10-CM | POA: Diagnosis not present

## 2020-03-12 DIAGNOSIS — E1140 Type 2 diabetes mellitus with diabetic neuropathy, unspecified: Secondary | ICD-10-CM | POA: Diagnosis not present

## 2020-03-12 DIAGNOSIS — E782 Mixed hyperlipidemia: Secondary | ICD-10-CM | POA: Diagnosis not present

## 2020-03-12 DIAGNOSIS — Z48812 Encounter for surgical aftercare following surgery on the circulatory system: Secondary | ICD-10-CM | POA: Diagnosis not present

## 2020-03-12 DIAGNOSIS — I2510 Atherosclerotic heart disease of native coronary artery without angina pectoris: Secondary | ICD-10-CM | POA: Diagnosis not present

## 2020-03-12 DIAGNOSIS — I10 Essential (primary) hypertension: Secondary | ICD-10-CM | POA: Diagnosis not present

## 2020-03-12 DIAGNOSIS — M4802 Spinal stenosis, cervical region: Secondary | ICD-10-CM | POA: Diagnosis not present

## 2020-03-12 DIAGNOSIS — I82612 Acute embolism and thrombosis of superficial veins of left upper extremity: Secondary | ICD-10-CM | POA: Diagnosis not present

## 2020-03-16 ENCOUNTER — Encounter: Payer: Self-pay | Admitting: Genetic Counselor

## 2020-03-16 NOTE — Progress Notes (Signed)
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 The change in variant classification was made as a result of re-review of the evidence in light of new variant interpretation guidelines and/or new information. The amended report date is March 11, 2020.

## 2020-03-17 ENCOUNTER — Other Ambulatory Visit: Payer: Self-pay | Admitting: "Endocrinology

## 2020-03-17 ENCOUNTER — Other Ambulatory Visit (INDEPENDENT_AMBULATORY_CARE_PROVIDER_SITE_OTHER): Payer: Self-pay | Admitting: Internal Medicine

## 2020-03-17 ENCOUNTER — Other Ambulatory Visit (HOSPITAL_COMMUNITY): Payer: Self-pay | Admitting: Hematology

## 2020-03-24 ENCOUNTER — Other Ambulatory Visit: Payer: Self-pay

## 2020-03-24 ENCOUNTER — Ambulatory Visit (HOSPITAL_COMMUNITY)
Admission: RE | Admit: 2020-03-24 | Discharge: 2020-03-24 | Disposition: A | Discharge status: Home / Self Care | Payer: Medicare HMO | Source: Ambulatory Visit | Attending: Hematology | Admitting: Hematology

## 2020-03-24 ENCOUNTER — Inpatient Hospital Stay (HOSPITAL_COMMUNITY): Payer: Medicare HMO | Attending: Hematology

## 2020-03-24 VITALS — BP 121/72 | HR 91 | Temp 98.4°F | Resp 18

## 2020-03-24 DIAGNOSIS — C259 Malignant neoplasm of pancreas, unspecified: Secondary | ICD-10-CM | POA: Insufficient documentation

## 2020-03-24 DIAGNOSIS — K8689 Other specified diseases of pancreas: Secondary | ICD-10-CM | POA: Diagnosis not present

## 2020-03-24 DIAGNOSIS — C251 Malignant neoplasm of body of pancreas: Secondary | ICD-10-CM | POA: Diagnosis not present

## 2020-03-24 LAB — COMPREHENSIVE METABOLIC PANEL
ALT: 28 U/L (ref 0–44)
AST: 26 U/L (ref 15–41)
Albumin: 4 g/dL (ref 3.5–5.0)
Alkaline Phosphatase: 57 U/L (ref 38–126)
Anion gap: 9 (ref 5–15)
BUN: 14 mg/dL (ref 8–23)
CO2: 26 mmol/L (ref 22–32)
Calcium: 9 mg/dL (ref 8.9–10.3)
Chloride: 101 mmol/L (ref 98–111)
Creatinine, Ser: 0.55 mg/dL — ABNORMAL LOW (ref 0.61–1.24)
GFR calc Af Amer: >60 mL/min (ref >60)
GFR calc non Af Amer: >60 mL/min (ref >60)
Glucose, Bld: 153 mg/dL — ABNORMAL HIGH (ref 70–99)
Potassium: 3.8 mmol/L (ref 3.5–5.1)
Sodium: 136 mmol/L (ref 135–145)
Total Bilirubin: 1 mg/dL (ref 0.3–1.2)
Total Protein: 6.9 g/dL (ref 6.5–8.1)

## 2020-03-24 LAB — CBC WITH DIFFERENTIAL/PLATELET
Abs Immature Granulocytes: 0.01 10*3/uL (ref 0.00–0.07)
Basophils Absolute: 0 10*3/uL (ref 0.0–0.1)
Basophils Relative: 1 %
Eosinophils Absolute: 0.1 10*3/uL (ref 0.0–0.5)
Eosinophils Relative: 2 %
HCT: 40.4 % (ref 39.0–52.0)
Hemoglobin: 13.8 g/dL (ref 13.0–17.0)
Immature Granulocytes: 0 %
Lymphocytes Relative: 42 %
Lymphs Abs: 2.3 10*3/uL (ref 0.7–4.0)
MCH: 31.2 pg (ref 26.0–34.0)
MCHC: 34.2 g/dL (ref 30.0–36.0)
MCV: 91.4 fL (ref 80.0–100.0)
Monocytes Absolute: 0.5 10*3/uL (ref 0.1–1.0)
Monocytes Relative: 10 %
Neutro Abs: 2.4 10*3/uL (ref 1.7–7.7)
Neutrophils Relative %: 45 %
Platelets: 216 10*3/uL (ref 150–400)
RBC: 4.42 MIL/uL (ref 4.22–5.81)
RDW: 14.2 % (ref 11.5–15.5)
WBC: 5.4 10*3/uL (ref 4.0–10.5)
nRBC: 0 % (ref 0.0–0.2)

## 2020-03-24 LAB — POCT I-STAT CREATININE: Creatinine, Ser: 0.6 mg/dL — ABNORMAL LOW (ref 0.61–1.24)

## 2020-03-24 MED ORDER — IOHEXOL 300 MG/ML  SOLN
100.0000 mL | Freq: Once | INTRAMUSCULAR | Status: AC | PRN
  Administered 2020-03-24: 100 mL via INTRAVENOUS

## 2020-03-24 MED ORDER — HEPARIN SOD (PORK) LOCK FLUSH 100 UNIT/ML IV SOLN
500.0000 [IU] | Freq: Once | INTRAVENOUS | Status: AC
  Administered 2020-03-24: 500 [IU] via INTRAVENOUS

## 2020-03-24 MED ORDER — SODIUM CHLORIDE 0.9% FLUSH
10.0000 mL | INTRAVENOUS | Status: DC | PRN
  Administered 2020-03-24: 10 mL via INTRAVENOUS

## 2020-03-24 NOTE — Progress Notes (Signed)
Mark Foster presented for Portacath access and flush.  Portacath located left chest wall accessed with  H 20 needle.  Good blood return present. Portacath flushed with 9ml NS and 500U/22ml Heparin and needle removed intact.  Procedure tolerated well and without incident.  Pt d/c clinic ambulatory.

## 2020-03-25 LAB — CANCER ANTIGEN 19-9: CA 19-9: 16 U/mL (ref 0–35)

## 2020-03-29 ENCOUNTER — Encounter: Payer: Self-pay | Admitting: Adult Health

## 2020-03-29 ENCOUNTER — Ambulatory Visit: Payer: Medicare HMO | Admitting: Adult Health

## 2020-03-29 VITALS — BP 104/66 | HR 74 | Ht 70.0 in | Wt 151.0 lb

## 2020-03-29 DIAGNOSIS — E785 Hyperlipidemia, unspecified: Secondary | ICD-10-CM | POA: Diagnosis not present

## 2020-03-29 DIAGNOSIS — I639 Cerebral infarction, unspecified: Secondary | ICD-10-CM | POA: Diagnosis not present

## 2020-03-29 DIAGNOSIS — C259 Malignant neoplasm of pancreas, unspecified: Secondary | ICD-10-CM | POA: Diagnosis not present

## 2020-03-29 DIAGNOSIS — E119 Type 2 diabetes mellitus without complications: Secondary | ICD-10-CM | POA: Diagnosis not present

## 2020-03-29 DIAGNOSIS — I69398 Other sequelae of cerebral infarction: Secondary | ICD-10-CM

## 2020-03-29 DIAGNOSIS — R569 Unspecified convulsions: Secondary | ICD-10-CM | POA: Diagnosis not present

## 2020-03-29 DIAGNOSIS — I10 Essential (primary) hypertension: Secondary | ICD-10-CM

## 2020-03-29 MED ORDER — LEVETIRACETAM ER 500 MG PO TB24: 500.0000 mg | ORAL_TABLET | Freq: Every day | ORAL | 3 refills | Status: DC

## 2020-03-29 NOTE — Patient Instructions (Addendum)
Continue Keppra XR 500 mg daily for seizure prevention -refill provided  Complete MRI brain w and wo contrast -will be called to schedule  Continue clopidogrel 75 mg daily  and atorvastatin and Zetia for secondary stroke prevention  Continue aspirin for cardiac conditions and Eliquis for left arm DVT -continue to follow with cardiology closely  Continue to follow up with PCP/cardiology regarding cholesterol, and blood pressure management  Continue to follow with endocrinology for diabetic management Maintain strict control of hypertension with blood pressure goal below 130/90, diabetes with hemoglobin A1c goal below 6.5% and cholesterol with LDL cholesterol (bad cholesterol) goal below 70 mg/dL.       Followup in the future with me in 6 months or call earlier if needed       Thank you for coming to see [place] at [facility]. I hope we have been able to provide you high quality care today.  You may receive a patient satisfaction survey over the next few weeks. We would appreciate your feedback and comments so that we may continue to improve ourselves and the health of our patients.

## 2020-03-29 NOTE — Progress Notes (Signed)
[facility] [address] [place]. [place] [address] [phone]       OFFICE FOLLOW UP NOTE  Mr. Mark Foster Date of Birth:  1956-02-03 Medical Record Number:  [id number]   Referring MD: Minus Breeding Reason for Referral: Stroke   Chief complaint: Chief Complaint  Patient presents with  . Seizures  . Cerebrovascular Accident      HPI:   Today, 03/29/2020, Mark Foster returns for follow-up with prior history of stroke and transient LOC episodes possibly syncope vs seizure.  Stable from a neurological standpoint since prior visit without recurrent episodes and remains on Keppra XR 500 mg daily tolerating well without side effects.  EEG completed which was normal.  Since prior visit, underwent CABGx4 and 01/2020 with a left subclavian DVT shortly after discharge and initiation of Eliquis currently managed by cardiology.  Previously on Plavix for stroke prevention and initiation of aspirin by cardiology post CABG procedure.  Remains on Eliquis, Plavix and aspirin without bleeding or bruising.  Continues on atorvastatin 80 mg daily and Zetia 10 mg daily without myalgias.  Blood pressure today 140/66.  Reports glucose levels stable.  DM managed by endocrinology.  Follows closely with cardiology, cardiothoracic and PCP.  No concerns at this time.    History provided for reference purposes only Update 12/11/2019 Dr. Leonie Man: Mark Foster is a 64 year old Caucasian male with past medical history of diabetes, vitamin D deficiency, pancreatic cancer s/p chemotherapy, hypothyroidism, diabetic neuropathy stroke in 2011 and hypertension who presented to [facility] on 10/09/2019 with episode of brief unresponsiveness.  Patient states that he had gone to a musical open mic outdoor event with a friend.  He remembers keeping his guitar down and going to the bar and getting a beer for himself and his friend.  He lost consciousness and the next thing he remembers was waking up  in the ambulance.  Apparently there was no witnessed tonic-clonic activity tongue bite.  The patient was little confused and had some slurred speech when he woke up.  The symptoms resolved by the time he reached the hospital.  He had an MRI scan of the brain done on 10/10/2019 which I personally reviewed shows tiny enhancing areas of enhancement in the right temporal lobe likely subacute infarcts.  Metastasis would be less likely.  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  He was in fact followed in office and had a strokelike episode on 01/19/2018 which was treated with IV TPA and showed clinical improvement at that time the MRI had shown remote age left right MCA infarct which patient was not aware of.  He had stent assisted angioplasty of left vertebral artery stenosis on 03/21/2018 by Dr. Estanislado Pandy.  He has no prior history so history of seizures in the form of loss of consciousness, tongue biting or injury.  He denies any headache, slurred speech, gait or balance problems.  ROS:   14 system review of systems is positive for see HPI and all other systems negative   PMH:  Past Medical History:  Diagnosis Date  . CAD (coronary artery disease)    a. 01/2020: CABG x4 with LIMA-LAD, SVG-OM, SVG-PDA and SVG-D1  . Carotid artery obstruction, left    Left ICA occlusion  . Cervical compression fracture ([place])   . Cervical spinal stenosis   . Coronary artery disease   . Diabetic neuropathy ([facility])    Bilateral legs  . Diverticulitis   .  Family history of pancreatic cancer   . History of kidney stones   . Hypothyroidism   . Pancreatic cancer ([place])   . Stenosis of left vertebral artery   . Stroke (cerebrum) ([place]) 10/09/2019  . Stroke [facility])    2011  . Type 2 diabetes mellitus ([place])     Social History:  Social History   Socioeconomic History  . Marital status: Single    Spouse name: Not on file  . Number of children: 2  . Years of education: Not  on file  . Highest education level: Not on file  Occupational History  . Occupation: Estate agent  Tobacco Use  . Smoking status: Former Smoker    Start date: 10/06/2019  . Smokeless tobacco: Never Used  Vaping Use  . Vaping Use: Never used  Substance and Sexual Activity  . Alcohol use: Yes    Alcohol/week: 1.0 standard drink    Types: 1 Cans of beer per week    Comment: rare   . Drug use: Never  . Sexual activity: Not on file  Other Topics Concern  . Not on file  Social History Narrative  . Not on file   Social Determinants of Health   Financial Resource Strain:   . Difficulty of Paying Living Expenses:   Food Insecurity:   . Worried About Charity fundraiser in the Last Year:   . Arboriculturist in the Last Year:   Transportation Needs:   . Film/video editor (Medical):   Marland Kitchen Lack of Transportation (Non-Medical):   Physical Activity:   . Days of Exercise per Week:   . Minutes of Exercise per Session:   Stress:   . Feeling of Stress :   Social Connections:   . Frequency of Communication with Friends and Family:   . Frequency of Social Gatherings with Friends and Family:   . Attends Religious Services:   . Active Member of Clubs or Organizations:   . Attends Archivist Meetings:   Marland Kitchen Marital Status:   Intimate Partner Violence:   . Fear of Current or Ex-Partner:   . Emotionally Abused:   Marland Kitchen Physically Abused:   . Sexually Abused:     Medications:   Current Outpatient Medications on File Prior to Visit  Medication Sig Dispense Refill  . acetaminophen (TYLENOL) 500 MG tablet Take 2 tablets (1,000 mg total) by mouth every 6 (six) hours. 30 tablet 0  . apixaban (ELIQUIS) 5 MG TABS tablet Take 1 tablet (5 mg total) by mouth 2 (two) times daily. 60 tablet 0  . aspirin EC 81 MG EC tablet Take 1 tablet (81 mg total) by mouth daily.    Marland Kitchen atorvastatin (LIPITOR) 80 MG tablet TAKE 1 TABLET(80 MG) BY MOUTH DAILY AT 6 PM (Patient taking differently: Take 80 mg  by mouth daily at 6 PM. ) 90 tablet 0  . cephALEXin (KEFLEX) 500 MG capsule Take 1 capsule (500 mg total) by mouth 3 (three) times daily. (Patient not taking: Reported on 02/25/2020) 21 capsule 0  . ezetimibe (ZETIA) 10 MG tablet Take 1 tablet (10 mg total) by mouth daily. 30 tablet 6  . gabapentin (NEURONTIN) 300 MG capsule Take 1 capsule (300 mg total) by mouth 2 (two) times daily. 180 capsule 0  . insulin glargine (LANTUS SOLOSTAR) 100 UNIT/ML Solostar Pen Inject 36 Units into the skin at bedtime. 5 pen 2  . Insulin Pen Needle 32G X 4 MM MISC 1 Device by Does  not apply route daily. 50 each 2  . levothyroxine (SYNTHROID) 50 MCG tablet Take 1 tablet (50 mcg total) by mouth daily. 90 tablet 1  . lidocaine-prilocaine (EMLA) cream Apply 1 application topically daily as needed (prior to port being accessed (~every 3 months)).    Marland Kitchen meclizine (ANTIVERT) 25 MG tablet TAKE 1 TABLET (25 MG TOTAL) BY MOUTH 3 (THREE) TIMES DAILY AS NEEDED FOR DIZZINESS. 30 tablet 0  . metoprolol tartrate (LOPRESSOR) 25 MG tablet Take 1 tablet (25 mg total) by mouth 2 (two) times daily. 180 tablet 3  . NOVOLOG FLEXPEN 100 UNIT/ML FlexPen INJECT 6 UNITS INTO THE SKIN 3 (THREE) TIMES DAILY WITH MEALS. 15 mL 0  . Omega-3 1000 MG CAPS Take 1,000 mg by mouth daily.    Marland Kitchen oxyCODONE (OXY IR/ROXICODONE) 5 MG immediate release tablet Take 1 tablet (5 mg total) by mouth every 6 (six) hours as needed for severe pain. 20 tablet 0  . pantoprazole (PROTONIX) 20 MG tablet TAKE 1 TABLET EVERY DAY 90 tablet 1  . sertraline (ZOLOFT) 50 MG tablet TAKE 1 TABLET BY MOUTH DAILY (Patient taking differently: Take 50 mg by mouth daily. ) 90 tablet 1  . tamsulosin (FLOMAX) 0.4 MG CAPS capsule Take 1 capsule (0.4 mg total) by mouth daily. 90 capsule 3  . vitamin B-12 (CYANOCOBALAMIN) 1000 MCG tablet Take 1,000 mcg by mouth daily.    . [DISCONTINUED] atorvastatin (LIPITOR) 80 MG tablet Take 1 tablet (80 mg total) by mouth daily at 6 PM. 90 tablet 0    Current Facility-Administered Medications on File Prior to Visit  Medication Dose Route Frequency Provider Last Rate Last Admin  . 0.9 %  sodium chloride infusion   Intravenous Continuous Derek Jack, MD 20 mL/hr at 12/30/18 1351 New Bag at 12/30/18 1351  . 0.9 %  sodium chloride infusion   Intravenous Continuous Derek Jack, MD 20 mL/hr at 12/30/18 1401 New Bag at 12/30/18 1401  . dextrose 5 % solution   Intravenous Once Derek Jack, MD      . heparin lock flush 100 unit/mL  500 Units Intracatheter Once PRN Derek Jack, MD      . sodium chloride flush (NS) 0.9 % injection 10 mL  10 mL Intracatheter PRN Derek Jack, MD   10 mL at 12/30/18 0911  . sodium chloride flush (NS) 0.9 % injection 10 mL  10 mL Intracatheter PRN Derek Jack, MD   10 mL at 05/21/19 0845    Allergies:   Allergies  Allergen Reactions  . Demerol [Meperidine Hcl] Other (See Comments)    convulsions    Today's Vitals   03/29/20 1229  BP: 104/66  Pulse: 74  Weight: 151 lb (68.5 kg)  Height: 5\' 10"  (1.778 m)   Body mass index is 21.67 kg/m.   Physical Exam General: Pleasant frail middle-age Caucasian male seated, in no evident distress Head: head normocephalic and atraumatic.   Neck: supple with no carotid or supraclavicular bruits Cardiovascular: regular rate and rhythm, no murmurs Musculoskeletal: no deformity Skin:  no rash/petichiae Vascular:  Normal pulses all extremities  Neurologic Exam Mental Status: Awake and fully alert.  Fluent speech and language.  Oriented to place and time. Recent and remote memory intact. Attention span, concentration and fund of knowledge appropriate. Mood and affect appropriate.  Cranial Nerves: Pupils equal, briskly reactive to light. Extraocular movements full without nystagmus. Visual fields full to confrontation. Hearing intact. Facial sensation intact. Face, tongue, palate moves normally and symmetrically.  Motor:  Normal bulk and tone. Normal strength in all tested extremity muscles. Sensory.: intact to touch , pinprick , but diminished position and vibratory sensation from ankle down bilaterally..  Romberg sign is positive Coordination: Rapid alternating movements normal in all extremities. Finger-to-nose and heel-to-shin performed accurately bilaterally. Gait and Station: Arises from chair without difficulty. Stance is broad-based gait demonstrates mild ataxia and broad-based.  Unable to walk tandem without difficulty. Reflexes: 1+ and symmetric in upper extremities and depressed in lower extremity.. Toes downgoing.      ASSESSMENT: 64 year old Caucasian male with recent episode of brief loss of consciousness possible prolonged syncope versus unwitnessed seizure with subacute embolic right temporal infarcts of cryptogenic etiology potentially related to hypercoagulability from pancreatic cancer.  Vascular risk factors of strokelike episode s/p TPA 01/2018, chronic left MCA stroke, hypertension, diabetes, hyperlipidemia,  pancreatic cancer s/p chemo (completed 09/2019) vertebral artery stenosis s/p angioplasty stenting 03/2018 and left ICA occlusion.  Recent CABGx4 on 01/26/2020 with DVT left subclavian shortly after discharge and initiated Eliquis in addition to aspirin and Plavix    PLAN:  1. History of multiple strokes:  -Continue atorvastatin 80 mg daily and aspirin 81 mg daily and clopidogrel 75 mg daily for secondary stroke prevention and cardiac history (aspirin recently added a post CABG procedure by cardiology). -Prior MRI recommended repeating MRI w/wo contrast due to concern for areas of infarct in right temporal lobe vs cerebral metastatic disease and to document expected post ischemic evolution of changes and exclude evolving metastatic disease.  Order placed for MRI brain w/wo contrast -30-day cardiac event monitor negative for atrial fibrillation -Close PCP, cardiology and endocrinology f/u for  aggressive stroke risk factor management   2. Syncope vs seizure:  -EEG normal -No recurrent episode -Continue Keppra XR 500 mg daily for seizure prophylaxis -refill provided  3. L VA stenosis s/p stenting and left ICA occlusion:  -Routine follow-up with Dr. Estanislado Pandy recommended repeat CTA head/neck around 07/2020  4. Acute LUE DVT:  -Continue Eliquis currently monitored and managed by cardiology  5. HTN:  -BP goal <130/90.  -Stable today.  -Continue f/u with PCP  6. HLD:  -LDL goal <70.  -Recent LDL 99.   -Continue atorvastatin and Zetia and f/u with PCP/cardiology for management and prescribing   7. DMII:  -A1c goal <7.0. Recent A1c 9.4. F/u with endocrinology for uncontrolled DM   Follow-up in 6 months or call earlier if needed    I spent 40 minutes of face-to-face and non-face-to-face time with patient.  This included previsit chart review, lab review, study review, order entry, electronic health record documentation, patient education regarding prior strokes, syncopal versus seizure type event, ongoing use of AED, recent CABG procedure and DVT, importance of managing stroke risk factors and answering questions to patient satisfaction      Frann Rider, AGNP-BC  [facility] [address] [place], [place] [address]  Phone [phone] Fax [phone] Note: This document was prepared with digital dictation and possible smart phrase technology. Any transcriptional errors that result from this process are unintentional.

## 2020-03-29 NOTE — Progress Notes (Signed)
I agree with the above plan 

## 2020-03-31 ENCOUNTER — Telehealth: Payer: Self-pay | Admitting: Adult Health

## 2020-03-31 ENCOUNTER — Ambulatory Visit (HOSPITAL_COMMUNITY): Payer: Medicare HMO | Admitting: Hematology

## 2020-03-31 NOTE — Telephone Encounter (Signed)
[employer] pending uploaded notes  

## 2020-04-05 NOTE — Telephone Encounter (Signed)
Mark Foster: [id number] (exp. 03/31/20 to 04/30/20) order sent to GI they will reach out to the patient to schedule.

## 2020-04-08 ENCOUNTER — Other Ambulatory Visit: Payer: Self-pay

## 2020-04-08 MED ORDER — EZETIMIBE 10 MG PO TABS: 10.0000 mg | ORAL_TABLET | Freq: Every day | ORAL | 1 refills | Status: DC

## 2020-04-08 NOTE — Telephone Encounter (Signed)
Refilled Zetia to [facility]

## 2020-04-14 ENCOUNTER — Other Ambulatory Visit: Payer: Self-pay

## 2020-04-14 ENCOUNTER — Inpatient Hospital Stay (HOSPITAL_COMMUNITY): Payer: Medicare HMO | Attending: Hematology | Admitting: Hematology

## 2020-04-14 VITALS — BP 106/70 | HR 80 | Temp 98.0°F | Resp 18 | Wt 157.2 lb

## 2020-04-14 DIAGNOSIS — Z7982 Long term (current) use of aspirin: Secondary | ICD-10-CM | POA: Insufficient documentation

## 2020-04-14 DIAGNOSIS — Z79899 Other long term (current) drug therapy: Secondary | ICD-10-CM | POA: Diagnosis not present

## 2020-04-14 DIAGNOSIS — E1140 Type 2 diabetes mellitus with diabetic neuropathy, unspecified: Secondary | ICD-10-CM | POA: Diagnosis not present

## 2020-04-14 DIAGNOSIS — Z87891 Personal history of nicotine dependence: Secondary | ICD-10-CM | POA: Insufficient documentation

## 2020-04-14 DIAGNOSIS — Z8249 Family history of ischemic heart disease and other diseases of the circulatory system: Secondary | ICD-10-CM | POA: Diagnosis not present

## 2020-04-14 DIAGNOSIS — Z8507 Personal history of malignant neoplasm of pancreas: Secondary | ICD-10-CM | POA: Insufficient documentation

## 2020-04-14 DIAGNOSIS — E039 Hypothyroidism, unspecified: Secondary | ICD-10-CM | POA: Diagnosis not present

## 2020-04-14 DIAGNOSIS — Z800 Family history of malignant neoplasm of digestive organs: Secondary | ICD-10-CM | POA: Diagnosis not present

## 2020-04-14 DIAGNOSIS — C259 Malignant neoplasm of pancreas, unspecified: Secondary | ICD-10-CM | POA: Diagnosis not present

## 2020-04-14 DIAGNOSIS — Z7901 Long term (current) use of anticoagulants: Secondary | ICD-10-CM | POA: Diagnosis not present

## 2020-04-14 DIAGNOSIS — Z86718 Personal history of other venous thrombosis and embolism: Secondary | ICD-10-CM | POA: Diagnosis not present

## 2020-04-14 DIAGNOSIS — Z794 Long term (current) use of insulin: Secondary | ICD-10-CM | POA: Diagnosis not present

## 2020-04-14 NOTE — Progress Notes (Signed)
Mark Foster, Mark Foster [id number]   CLINIC:  Medical Oncology/Hematology  PCP:  Ailene Ards, NP [place] / [place] [address] [phone]   REASON FOR VISIT:  Follow-up for pancreatic adenocarcinoma  PRIOR THERAPY:  1. Distal pancreatectomy and splenectomy at [facility] on 11/20/2018. 2. Adjuvant FOLFIRINOX x 12 cycles from 12/30/2018 to 06/04/2019.  NGS Results: BRCA1 heterozygous VUS  CURRENT THERAPY: Observation  BRIEF ONCOLOGIC HISTORY:  Oncology History  Pancreatic adenocarcinoma ([place])  08/20/2018 Imaging   CT abdomen/pelvis w/ contrast: IMPRESSION: Atrophy and ductal dilatation involving the pancreatic tail, with suspected small soft tissue mass in the pancreatic body which could represent pancreatic carcinoma. Abdomen MRI and MRCP without and with contrast is recommended for further evaluation.  No evidence of hepatobiliary disease.   08/30/2018 Imaging   MRI abdomen w/ contrast: IMPRESSION: 1. Although not definitive, there remains concern of a small hypoenhancing mass at the junction of the pancreatic body and tail associated with atrophy and ductal dilatation in the pancreatic tail. This remains concerning for pancreatic neoplasm. Postinflammatory stricture less likely. Besides a tiny cystic lesion in the pancreatic tail, there are no other signs of previous pancreatitis. Further evaluation with endoscopic ultrasound for possible biopsy strongly recommended. 2. No evidence of metastatic disease. 3. Cholelithiasis without evidence of cholecystitis or biliary dilatation.   09/03/2018 Initial Diagnosis   Pancreatic adenocarcinoma ([place])   10/03/2018 Procedure   EUS: - 2.6cm irregularly shaped mass in the body of the pancreas that causing main pancreatic duct obstruction and dilation. The mass abuts the splenic vessels but no other significant vascular structures and it was sampled with trangastric EUS FNA.  Preliminary cytology is + for malignancy, likely well-differentiated adenocarcinoma. It appears surgically resectable.   10/03/2018 Pathology Results   Accession: [id number]  FINE NEEDLE ASPIRATION, ENDOSCOPIC, PANCREAS BODY (SPECIMEN 1 OF 1 COLLECTED 10/03/18): ADENOCARCINOMA.   10/17/2018 Imaging   PET: IMPRESSION: 1. Tiny focus of hypermetabolism identified in the body of pancreas, adjacent to the abrupt cut off of the main pancreatic duct. No evidence for hypermetabolic metastatic disease in the neck, chest, abdomen, or pelvis. 2. Cholelithiasis. 3.  Aortic Atherosclerois (ICD10-170.0) 4. 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  The Hereditary Gene Panel offered by [facility] includes sequencing and/or deletion duplication testing of the following 47 genes: APC, ATM, AXIN2, BARD1, BMPR1A, BRCA1, BRCA2, BRIP1, CDH1, CDK4, CDKN2A (p14ARF), CDKN2A (p16INK4a), CHEK2, CTNNA1, DICER1, EPCAM (Deletion/duplication testing only), GREM1 (promoter region deletion/duplication testing only), KIT, MEN1, MLH1, MSH2, MSH3, MSH6, MUTYH, NBN, NF1, NHTL1, PALB2, PDGFRA, PMS2, POLD1, POLE, PTEN, RAD50, RAD51C, RAD51D, SDHB, SDHC, SDHD, SMAD4, SMARCA4. STK11, TP53, TSC1, TSC2, and VHL.  The following genes were evaluated for sequence changes only: SDHA and HOXB13 c.251G>A variant only. The report date is November 12, 2018.  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 The change in variant classification was made as a result of re-review of the evidence in light of new variant interpretation guidelines and/or new information. The amended report date is March 11, 2020.    11/20/2018 Surgery   Distal subtotal pancreatectomy with splenectomy at [facility]   11/20/2018 Surgery   TUMOR   Tumor Site:  Pancreatic body    Histologic Type:  Ductal adenocarcinoma    Histologic Grade:   G1: Well differentiated    Tumor Size:  Greatest dimension in  Centimeters (cm): 2.8 Centimeters (cm)    Additional Dimension in Centimeters (cm):  2.7 Centimeters (cm)    Additional Dimension in Centimeters (cm):  1.8 Centimeters (cm)   Tumor Extent:      Tumor Extension:  Tumor is confined to pancreas    Accessory Findings:      Treatment Effect:  No known presurgical therapy     Lymphovascular Invasion:  Not identified     Perineural Invasion:  Not identified   MARGINS   Margins:      Proximal Pancreatic Parenchymal Margin:  Uninvolved by invasive carcinoma and pancreatic high-grade intraepithelial neoplasia      Distance of Invasive Carcinoma from Margin:  0.6 Centimeters (cm)   :      Other Margin:  Splenic margin.      Margin Status:  Uninvolved by invasive carcinoma   LYMPH NODES  Number of Lymph Nodes Involved:  2   Number of Lymph Nodes Examined:  16   PATHOLOGIC STAGE CLASSIFICATION (pTNM, AJCC 8th Edition)  TNM Descriptors:  Not applicable   Primary Tumor (pT):  pT2   Regional Lymph Nodes (pN):  pN1   ADDITIONAL FINDINGS  Additional Pathologic Findings:  Pancreatic intraepithelial neoplasia    Highest Grade (PanIN):  High grade PanIN is present.   Additional Pathologic Findings:  Benign unilocular pancreatic cyst (1.3 cm in greatest dimension) at the pancreatic tail.   12/30/2018 -  Chemotherapy   The patient had palonosetron (ALOXI) injection 0.25 mg, 0.25 mg, Intravenous,  Once, 12 of 12 cycles Administration: 0.25 mg (12/30/2018), 0.25 mg (01/13/2019), 0.25 mg (01/28/2019), 0.25 mg (02/11/2019), 0.25 mg (02/25/2019), 0.25 mg (03/11/2019), 0.25 mg (03/25/2019), 0.25 mg (04/09/2019), 0.25 mg (04/23/2019), 0.25 mg (05/07/2019), 0.25 mg (05/21/2019), 0.25 mg (06/04/2019) pegfilgrastim (NEULASTA) injection 6 mg, 6 mg, Subcutaneous, Once, 7 of 7 cycles Administration: 6 mg (01/01/2019), 6 mg (01/15/2019), 6 mg  (01/30/2019), 6 mg (02/13/2019), 6 mg (02/27/2019), 6 mg (03/13/2019), 6 mg (03/27/2019) pegfilgrastim-jmdb (FULPHILA) injection 6 mg, 6 mg, Subcutaneous,  Once, 5 of 5 cycles Administration: 6 mg (04/11/2019), 6 mg (04/25/2019), 6 mg (05/09/2019), 6 mg (05/23/2019), 6 mg (06/06/2019) irinotecan (CAMPTOSAR) 280 mg in sodium chloride 0.9 % 500 mL chemo infusion, 150 mg/m2 = 280 mg (100 % of original dose 150 mg/m2), Intravenous,  Once, 12 of 12 cycles Dose modification: 150 mg/m2 (original dose 150 mg/m2, Cycle 1, Reason: Provider Judgment) Administration: 280 mg (12/30/2018), 280 mg (01/13/2019), 280 mg (01/28/2019), 280 mg (02/11/2019), 280 mg (02/25/2019), 280 mg (03/11/2019), 280 mg (03/25/2019), 280 mg (04/09/2019), 280 mg (04/23/2019), 280 mg (05/07/2019), 280 mg (05/21/2019), 280 mg (06/04/2019) leucovorin 700 mg in sodium chloride 0.9 % 250 mL infusion, 744 mg, Intravenous,  Once, 12 of 12 cycles Administration: 700 mg (12/30/2018), 700 mg (01/13/2019), 700 mg (01/28/2019), 700 mg (02/11/2019), 700 mg (02/25/2019), 700 mg (03/11/2019), 700 mg (03/25/2019), 700 mg (04/09/2019), 700 mg (04/23/2019), 700 mg (05/07/2019), 700 mg (05/21/2019), 700 mg (06/04/2019) oxaliplatin (ELOXATIN) 120 mg in dextrose 5 % 500 mL chemo infusion, 63.75 mg/m2 = 120 mg (75 % of original dose 85 mg/m2), Intravenous,  Once, 12 of 12 cycles Dose modification: 63.75 mg/m2 (75 % of original dose 85 mg/m2, Cycle 1, Reason: Other (see comments), Comment: high risk for worsening neuropathy), 42.5 mg/m2 (50 % of original dose 85 mg/m2, Cycle 11, Reason: Other (see comments), Comment: neuropathy) Administration: 120 mg (12/30/2018), 120 mg (01/13/2019), 120 mg (01/28/2019), 120 mg (02/11/2019), 120 mg (02/25/2019), 120 mg (03/11/2019), 120 mg (03/25/2019),  120 mg (04/09/2019), 120 mg (04/23/2019), 120 mg (05/07/2019), 80 mg (05/21/2019), 80 mg (06/04/2019) fosaprepitant (EMEND) 150 mg, dexamethasone (DECADRON) 12 mg in sodium chloride 0.9 % 145 mL IVPB, , Intravenous,  Once, 12 of 12  cycles Administration:  (12/30/2018),  (01/13/2019),  (01/28/2019),  (02/11/2019),  (02/25/2019),  (03/11/2019),  (03/25/2019),  (04/09/2019),  (04/23/2019),  (05/07/2019),  (05/21/2019),  (06/04/2019) fluorouracil (ADRUCIL) 4,450 mg in sodium chloride 0.9 % 61 mL chemo infusion, 2,400 mg/m2 = 4,450 mg, Intravenous, 1 Day/Dose, 12 of 12 cycles Administration: 4,450 mg (12/30/2018), 4,450 mg (01/13/2019), 4,450 mg (01/28/2019), 4,450 mg (02/11/2019), 4,450 mg (02/25/2019), 4,450 mg (03/11/2019), 4,450 mg (03/25/2019), 4,450 mg (04/09/2019), 4,450 mg (04/23/2019), 4,450 mg (05/07/2019), 4,450 mg (05/21/2019), 4,450 mg (06/04/2019)  for chemotherapy treatment.      CANCER STAGING: Cancer Staging No matching staging information was found for the patient.  INTERVAL HISTORY:  Mr. Mark Foster, a 64 y.o. male, returns for routine follow-up of his pancreatic adenocarcinoma. Mark Foster was last seen on 12/24/2019.   Today he reports that he is doing well. He denies having any new issues, and denies any abdominal pain or cramping. He is doing better since his bypass. He is still taking Plavix and Eliquis and he denies any nosebleeds, hematochezia or hematuria.  He is trying to walk without his cane.   REVIEW OF SYSTEMS:  Review of Systems  Constitutional: Positive for appetite change (mildly decreased) and fatigue (moderate).  HENT:   Negative for nosebleeds.   Gastrointestinal: Negative for abdominal pain and blood in stool.  Genitourinary: Negative for hematuria.   All other systems reviewed and are negative.   PAST MEDICAL/SURGICAL HISTORY:  Past Medical History:  Diagnosis Date  . CAD (coronary artery disease)    a. 01/2020: CABG x4 with LIMA-LAD, SVG-OM, SVG-PDA and SVG-D1  . Carotid artery obstruction, left    Left ICA occlusion  . Cervical compression fracture ([place])   . Cervical spinal stenosis   . Coronary artery disease   . Diabetic neuropathy ([facility])    Bilateral legs  . Diverticulitis   . Family history of  pancreatic cancer   . History of kidney stones   . Hypothyroidism   . Pancreatic cancer ([place])   . Stenosis of left vertebral artery   . 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 Stroke [facility])    2011  . Type 2 diabetes mellitus ([place])    Past Surgical History:  Procedure Laterality Date  . APPENDECTOMY    . BUBBLE STUDY N/A 11/18/2019   Procedure: BUBBLE STUDY;  Surgeon: Herminio Commons, MD;  Location: [facility];  Service: Cardiology;  Laterality: N/A;  . COLON RESECTION     For diverticulitis  . COLON SURGERY    . CORONARY ARTERY BYPASS GRAFT N/A 01/26/2020   Procedure: CORONARY ARTERY BYPASS GRAFTING (CABG) TIMES FOUR USING LEFT INTERNAL MAMMARY VEIN AND RIGHT GREATER SAPHENOUS VEIN;  Surgeon: Melrose Nakayama, MD;  Location: [facility];  Service: Open Heart Surgery;  Laterality: N/A;  . ENDOVEIN HARVEST OF GREATER SAPHENOUS VEIN Right 01/26/2020   Procedure: [place] Of Greater Saphenous Vein;  Surgeon: Melrose Nakayama, MD;  Location: [facility];  Service: Open Heart Surgery;  Laterality: Right;  . ESOPHAGOGASTRODUODENOSCOPY N/A 10/03/2018   Procedure: ESOPHAGOGASTRODUODENOSCOPY (EGD);  Surgeon: [name], MD;  Location: [name];  Service: Endoscopy;  Laterality: N/A;  . EUS N/A 10/03/2018   Procedure: UPPER ENDOSCOPIC ULTRASOUND (EUS) RADIAL;  Surgeon: [name], MD;  Location:  [facility];  Service: Endoscopy;  Laterality: N/A;  . EYE SURGERY Bilateral   . FINE NEEDLE ASPIRATION N/A 10/03/2018   Procedure: FINE NEEDLE ASPIRATION (FNA) LINEAR;  Surgeon: [name], MD;  Location: [facility];  Service: Endoscopy;  Laterality: N/A;  . IR ANGIO INTRA EXTRACRAN SEL COM CAROTID INNOMINATE BILAT MOD SED  01/21/2018  . IR ANGIO INTRA EXTRACRAN SEL COM CAROTID INNOMINATE UNI R MOD SED  03/21/2018  . IR ANGIO VERTEBRAL SEL VERTEBRAL BILAT MOD SED  01/21/2018  . IR TRANSCATH EXCRAN VERT OR CAR A STENT  03/21/2018  . LEFT HEART CATH AND CORONARY ANGIOGRAPHY N/A  01/08/2020   Procedure: LEFT HEART CATH AND CORONARY ANGIOGRAPHY;  Surgeon: Leonie Man, MD;  Location: [facility];  Service: Cardiovascular;  Laterality: N/A;  . PORTACATH PLACEMENT Left 12/23/2018   Procedure: INSERTION PORT-A-CATH;  Surgeon: Virl Cagey, MD;  Location: [facility];  Service: General;  Laterality: Left;  . RADIOLOGY WITH ANESTHESIA N/A 03/21/2018   Procedure: IR WITH ANESTHESIA WITH STENT PLACEMENT;  Surgeon: Luanne Bras, MD;  Location: [place];  Service: Radiology;  Laterality: N/A;  . ROTATOR CUFF REPAIR     Left  . SPLENECTOMY    . TEE WITHOUT CARDIOVERSION N/A 11/18/2019   Procedure: TRANSESOPHAGEAL ECHOCARDIOGRAM (TEE) WITH PROPOFOL;  Surgeon: Herminio Commons, MD;  Location: [facility];  Service: Cardiology;  Laterality: N/A;  . TEE WITHOUT CARDIOVERSION N/A 01/26/2020   Procedure: TRANSESOPHAGEAL ECHOCARDIOGRAM (TEE);  Surgeon: Melrose Nakayama, MD;  Location: [facility];  Service: Open Heart Surgery;  Laterality: N/A;  . TONSILLECTOMY      SOCIAL HISTORY:  Social History   Socioeconomic History  . Marital status: Single    Spouse name: Not on file  . Number of children: 2  . Years of education: Not on file  . Highest education level: Not on file  Occupational History  . Occupation: Estate agent  Tobacco Use  . Smoking status: Former Smoker    Start date: 10/06/2019  . Smokeless tobacco: Never Used  Vaping Use  . Vaping Use: Never used  Substance and Sexual Activity  . Alcohol use: Yes    Alcohol/week: 1.0 standard drink    Types: 1 Cans of beer per week    Comment: rare   . Drug use: Never  . Sexual activity: Not on file  Other Topics Concern  . Not on file  Social History Narrative  . Not on file   Social Determinants of Health   Financial Resource Strain:   . Difficulty of Paying Living Expenses:   Food Insecurity:   . Worried About Charity fundraiser in the Last Year:   . Arboriculturist in the Last Year:    Transportation Needs:   . Film/video editor (Medical):   Marland Kitchen Lack of Transportation (Non-Medical):   Physical Activity:   . Days of Exercise per Week:   . Minutes of Exercise per Session:   Stress:   . Feeling of Stress :   Social Connections:   . Frequency of Communication with Friends and Family:   . Frequency of Social Gatherings with Friends and Family:   . Attends Religious Services:   . Active Member of Clubs or Organizations:   . Attends Archivist Meetings:   Marland Kitchen Marital Status:   Intimate Partner Violence:   . Fear of Current or Ex-Partner:   . Emotionally Abused:   Marland Kitchen Physically Abused:   .  Sexually Abused:     FAMILY HISTORY:  Family History  Problem Relation Age of Onset  . Stroke Mother   . Pancreatic cancer Father 69       d. 46  . Stroke Maternal Grandmother   . Heart attack Maternal Grandfather   . Heart Problems Paternal Grandfather   . Scoliosis Daughter   . Muscular dystrophy Grandson     CURRENT MEDICATIONS:  Current Outpatient Medications  Medication Sig Dispense Refill  . acetaminophen (TYLENOL) 500 MG tablet Take 2 tablets (1,000 mg total) by mouth every 6 (six) hours. 30 tablet 0  . aspirin EC 81 MG EC tablet Take 1 tablet (81 mg total) by mouth daily.    Marland Kitchen atorvastatin (LIPITOR) 80 MG tablet TAKE 1 TABLET(80 MG) BY MOUTH DAILY AT 6 PM (Patient taking differently: Take 80 mg by mouth daily at 6 PM. ) 90 tablet 0  . clopidogrel (PLAVIX) 75 MG tablet Take 75 mg by mouth daily.    Marland Kitchen ezetimibe (ZETIA) 10 MG tablet Take 1 tablet (10 mg total) by mouth daily. 90 tablet 1  . gabapentin (NEURONTIN) 300 MG capsule Take 1 capsule (300 mg total) by mouth 2 (two) times daily. 180 capsule 0  . insulin glargine (LANTUS SOLOSTAR) 100 UNIT/ML Solostar Pen Inject 36 Units into the skin at bedtime. 5 pen 2  . Insulin Pen Needle 32G X 4 MM MISC 1 Device by Does not apply route daily. 50 each 2  . levETIRAcetam (KEPPRA XR) 500 MG 24 hr tablet Take 1  tablet (500 mg total) by mouth daily. 90 each 3  . levothyroxine (SYNTHROID) 50 MCG tablet Take 1 tablet (50 mcg total) by mouth daily. 90 tablet 1  . lidocaine-prilocaine (EMLA) cream Apply 1 application topically daily as needed (prior to port being accessed (~every 3 months)).    Marland Kitchen meclizine (ANTIVERT) 25 MG tablet TAKE 1 TABLET (25 MG TOTAL) BY MOUTH 3 (THREE) TIMES DAILY AS NEEDED FOR DIZZINESS. 30 tablet 0  . NOVOLOG FLEXPEN 100 UNIT/ML FlexPen INJECT 6 UNITS INTO THE SKIN 3 (THREE) TIMES DAILY WITH MEALS. 15 mL 0  . Omega-3 1000 MG CAPS Take 1,000 mg by mouth daily.    Marland Kitchen oxyCODONE (OXY IR/ROXICODONE) 5 MG immediate release tablet Take 1 tablet (5 mg total) by mouth every 6 (six) hours as needed for severe pain. 20 tablet 0  . pantoprazole (PROTONIX) 20 MG tablet TAKE 1 TABLET EVERY DAY 90 tablet 1  . sertraline (ZOLOFT) 50 MG tablet TAKE 1 TABLET BY MOUTH DAILY (Patient taking differently: Take 50 mg by mouth daily. ) 90 tablet 1  . tamsulosin (FLOMAX) 0.4 MG CAPS capsule Take 1 capsule (0.4 mg total) by mouth daily. 90 capsule 3  . vitamin B-12 (CYANOCOBALAMIN) 1000 MCG tablet Take 1,000 mcg by mouth daily.    Marland Kitchen apixaban (ELIQUIS) 5 MG TABS tablet Take 1 tablet (5 mg total) by mouth 2 (two) times daily. 60 tablet 0  . metoprolol tartrate (LOPRESSOR) 25 MG tablet Take 1 tablet (25 mg total) by mouth 2 (two) times daily. 180 tablet 3   No current facility-administered medications for this visit.   Facility-Administered Medications Ordered in Other Visits  Medication Dose Route Frequency Provider Last Rate Last Admin  . 0.9 %  sodium chloride infusion   Intravenous Continuous Doreatha Massed, MD 20 mL/hr at 12/30/18 1351 New Bag at 12/30/18 1351  . 0.9 %  sodium chloride infusion   Intravenous Continuous Doreatha Massed, MD 20  mL/hr at 12/30/18 1401 New Bag at 12/30/18 1401  . dextrose 5 % solution   Intravenous Once Derek Jack, MD      . heparin lock flush 100 unit/mL   500 Units Intracatheter Once PRN Derek Jack, MD      . sodium chloride flush (NS) 0.9 % injection 10 mL  10 mL Intracatheter PRN Derek Jack, MD   10 mL at 12/30/18 0911  . sodium chloride flush (NS) 0.9 % injection 10 mL  10 mL Intracatheter PRN Derek Jack, MD   10 mL at 05/21/19 0845    ALLERGIES:  Allergies  Allergen Reactions  . Demerol [Meperidine Hcl] Other (See Comments)    convulsions    PHYSICAL EXAM:  Performance status (ECOG): 1 - Symptomatic but completely ambulatory  Vitals:   04/14/20 1208  BP: 106/70  Pulse: 80  Resp: 18  Temp: 98 F (36.7 C)  SpO2: 99%   Wt Readings from Last 3 Encounters:  04/14/20 157 lb 3.2 oz (71.3 kg)  03/29/20 151 lb (68.5 kg)  02/25/20 156 lb (70.8 kg)   Physical Exam Vitals reviewed.  Constitutional:      Appearance: Normal appearance.  Cardiovascular:     Rate and Rhythm: Normal rate and regular rhythm.     Pulses: Normal pulses.     Heart sounds: Normal heart sounds.  Pulmonary:     Effort: Pulmonary effort is normal.     Breath sounds: Normal breath sounds.  Abdominal:     Palpations: Abdomen is soft. There is no hepatomegaly, splenomegaly or mass.     Tenderness: There is no abdominal tenderness.     Hernia: No hernia is present.  Neurological:     General: No focal deficit present.     Mental Status: He is alert and oriented to person, place, and time.  Psychiatric:        Mood and Affect: Mood normal.        Behavior: Behavior normal.      LABORATORY DATA:  I have reviewed the labs as listed.  CBC Latest Ref Rng & Units 03/24/2020 02/01/2020 01/29/2020  WBC 4.0 - 10.5 K/uL 5.4 6.7 7.1  Hemoglobin 13.0 - 17.0 g/dL 13.8 11.8(L) 11.2(L)  Hematocrit 39 - 52 % 40.4 35.1(L) 32.8(L)  Platelets 150 - 400 K/uL 216 246 130(L)   CMP Latest Ref Rng & Units 03/24/2020 03/24/2020 02/01/2020  Glucose 70 - 99 mg/dL 153(H) - 294(H)  BUN 8 - 23 mg/dL 14 - 17  Creatinine 0.61 - 1.24 mg/dL 0.55(L)  0.60(L) 0.71  Sodium 135 - 145 mmol/L 136 - 134(L)  Potassium 3.5 - 5.1 mmol/L 3.8 - 4.5  Chloride 98 - 111 mmol/L 101 - 99  CO2 22 - 32 mmol/L 26 - 26  Calcium 8.9 - 10.3 mg/dL 9.0 - 8.7(L)  Total Protein 6.5 - 8.1 g/dL 6.9 - -  Total Bilirubin 0.3 - 1.2 mg/dL 1.0 - -  Alkaline Phos 38 - 126 U/L 57 - -  AST 15 - 41 U/L 26 - -  ALT 0 - 44 U/L 28 - -   Lab Results  Component Value Date   CAN199 16 03/24/2020   [id number] 13 12/18/2019   [id number] 25 10/10/2019    DIAGNOSTIC IMAGING:  I have independently reviewed the scans and discussed with the patient. CT Abdomen Pelvis W Contrast  Result Date: 03/24/2020 CLINICAL DATA:  Pancreatic adenocarcinoma, status post distal pancreatectomy and splenectomy March 2020, status post chemotherapy, status post  colon resection EXAM: CT ABDOMEN AND PELVIS WITH CONTRAST TECHNIQUE: Multidetector CT imaging of the abdomen and pelvis was performed using the standard protocol following bolus administration of intravenous contrast. CONTRAST:  151m OMNIPAQUE IOHEXOL 300 MG/ML SOLN, additional oral enteric contrast COMPARISON:  12/18/2019 FINDINGS: Lower chest: No acute abnormality.  Coronary artery calcifications. Hepatobiliary: No solid liver abnormality is seen. Small gallstone near the gallbladder neck. No gallbladder wall thickening or biliary dilatation. Pancreas: Status post distal distal pancreatectomy. Unchanged low-attenuation lesion of the pancreatic uncinate measuring 1.2 x 0.8 cm (series 6, image 47). Unchanged low-attenuation lesion near the surgical margin at the pancreatic head neck junction measuring 1.2 x 0.9 cm (series 6, image 35). Spleen: Status post splenectomy. Adrenals/Urinary Tract: Adrenal glands are unremarkable. Kidneys are normal, without renal calculi, solid lesion, or hydronephrosis. Bladder is unremarkable. Stomach/Bowel: Stomach is within normal limits. Appendix is surgically absent. No evidence of bowel wall thickening, distention, or  inflammatory changes. Vascular/Lymphatic: Aortic atherosclerosis. No enlarged abdominal or pelvic lymph nodes. Surgical clips about the celiac axis. Unchanged prominent retroperitoneal lymph nodes (series 6, image 43). Reproductive: No mass or other significant abnormality. Other: No abdominal wall hernia or abnormality. No abdominopelvic ascites. Musculoskeletal: No acute or significant osseous findings. IMPRESSION: 1. Status post distal pancreatectomy and splenectomy. 2. Unchanged low-attenuation lesion of the pancreatic uncinate measuring 1.2 x 0.8 cm. Unchanged low-attenuation lesion near the surgical margin at the pancreatic head neck junction measuring 1.2 x 0.9 cm. These may reflect postoperative seroma. Attention on follow-up. 3. Unchanged prominent retroperitoneal lymph nodes. Attention on follow-up. 4. Cholelithiasis. 5. Coronary artery disease.  Aortic Atherosclerosis (ICD10-I70.0). Electronically Signed   By: AEddie CandleM.D.   On: 03/24/2020 15:52     ASSESSMENT:  1.  Stage IIb (T2N1) pancreatic adenocarcinoma: -[facility]. 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 -12 cycles of FOLFIRINOX from 12/30/2018 through 06/04/2019. -MRI of the brain on 05/19/2019 did not show any abnormalities.  This was done because of new onset numbness in the left lower lip during therapy. -CT CAP on 03/24/2020 shows unchanged low-attenuation lesion of the pancreatic uncinate measuring 1.2 x 0.8 cm.  Unchanged low-attenuation lesion near the surgical margin of the pancreatic head and neck junction measuring 1.2 x 0.9 cm.  These may reflect postoperative seroma.  Unchanged prominent retroperitoneal lymph nodes.  2.  Post splenectomy state: -He received vaccination prior to splenectomy.   PLAN:  1.  Stage IIb (T2N1) pancreatic adenocarcinoma: -No clinical signs or symptoms of recurrence. -CTAP on 03/24/2020 shows no evidence of  recurrence. -Labs reviewed which showed CA 19-9 was 16.  LFTs were normal. -I plan to see him back in 4 months with repeat labs and scan.  Continue port flush.  2.  Left upper extremity DVT: -Likely port induced.  To continue Eliquis at this time. -We discussed discontinuation of port.  He would like to wait until next visit.  3.  Diabetes: -Continue NovoLog.  Continue Lantus.  4.  Neuropathy: -On and off neuropathy in the hands and feet has been stable.   Orders placed this encounter:  No orders of the defined types were placed in this encounter.    SDerek Jack MD [phone]  I, DMilinda Antis am acting as a scribe for Dr. SSanda Linger  I, SDerek JackMD, have reviewed the above documentation for accuracy and completeness, and I agree with the above.

## 2020-04-14 NOTE — Patient Instructions (Signed)
[place] at [facility] Discharge Instructions  You were seen today by Dr. Delton Coombes. He went over your recent results and scans. You will be scheduled for a CT scan of your abdomen before your next visit. Dr. Delton Coombes will see you back in 4 months for labs and follow up.   Thank you for choosing [place] at [facility] to provide your oncology and hematology care.  To afford each patient quality time with our provider, please arrive at least 15 minutes before your scheduled appointment time.   If you have a lab appointment with the [place] please come in thru the Main Entrance and check in at the main information desk  You need to re-schedule your appointment should you arrive 10 or more minutes late.  We strive to give you quality time with our providers, and arriving late affects you and other patients whose appointments are after yours.  Also, if you no show three or more times for appointments you may be dismissed from the clinic at the providers discretion.     Again, thank you for choosing [facility].  Our hope is that these requests will decrease the amount of time that you wait before being seen by our physicians.       _____________________________________________________________  Should you have questions after your visit to [facility], please contact our office at [phone] between the hours of 8:00 a.m. and 4:30 p.m.  Voicemails left after 4:00 p.m. will not be returned until the following business day.  For prescription refill requests, have your pharmacy contact our office and allow 72 hours.    Cancer Center Support Programs:   > Cancer Support Group  2nd Tuesday of the month 1pm-2pm, Journey Room

## 2020-04-16 ENCOUNTER — Encounter (HOSPITAL_COMMUNITY)
Admission: RE | Admit: 2020-04-16 | Discharge: 2020-04-16 | Disposition: A | Discharge status: Home / Self Care | Payer: Medicare HMO | Source: Ambulatory Visit | Attending: Internal Medicine | Admitting: Internal Medicine

## 2020-04-16 ENCOUNTER — Encounter (HOSPITAL_COMMUNITY): Payer: Self-pay

## 2020-04-16 ENCOUNTER — Other Ambulatory Visit: Payer: Self-pay

## 2020-04-16 VITALS — BP 100/60 | HR 93 | Ht 70.0 in | Wt 156.9 lb

## 2020-04-16 DIAGNOSIS — Z951 Presence of aortocoronary bypass graft: Secondary | ICD-10-CM

## 2020-04-16 NOTE — Progress Notes (Signed)
Cardiac Individual Treatment Plan  Patient Details  Name: Mark Foster MRN: [id number] Date of Birth: 1955-10-22 Referring Provider:     CARDIAC REHAB PHASE II EXERCISE from 04/16/2020 in [place]  Referring Provider [name]      Initial Encounter Date:    CARDIAC REHAB PHASE II EXERCISE from 04/16/2020 in [place]  Date 04/16/20      Visit Diagnosis: S/P CABG x 4  Patient's Home Medications on Admission:  Current Outpatient Medications:  .  aspirin EC 81 MG EC tablet, Take 1 tablet (81 mg total) by mouth daily., Disp: , Rfl:  .  atorvastatin (LIPITOR) 80 MG tablet, TAKE 1 TABLET(80 MG) BY MOUTH DAILY AT 6 PM (Patient taking differently: Take 80 mg by mouth daily at 6 PM. ), Disp: 90 tablet, Rfl: 0 .  clopidogrel (PLAVIX) 75 MG tablet, Take 75 mg by mouth daily., Disp: , Rfl:  .  ezetimibe (ZETIA) 10 MG tablet, Take 1 tablet (10 mg total) by mouth daily., Disp: 90 tablet, Rfl: 1 .  gabapentin (NEURONTIN) 300 MG capsule, Take 1 capsule (300 mg total) by mouth 2 (two) times daily., Disp: 180 capsule, Rfl: 0 .  insulin glargine (LANTUS SOLOSTAR) 100 UNIT/ML Solostar Pen, Inject 36 Units into the skin at bedtime., Disp: 5 pen, Rfl: 2 .  Insulin Pen Needle 32G X 4 MM MISC, 1 Device by Does not apply route daily., Disp: 50 each, Rfl: 2 .  levETIRAcetam (KEPPRA XR) 500 MG 24 hr tablet, Take 1 tablet (500 mg total) by mouth daily., Disp: 90 each, Rfl: 3 .  levothyroxine (SYNTHROID) 50 MCG tablet, Take 1 tablet (50 mcg total) by mouth daily., Disp: 90 tablet, Rfl: 1 .  lidocaine-prilocaine (EMLA) cream, Apply 1 application topically daily as needed (prior to port being accessed (~every 3 months))., Disp: , Rfl:  .  NOVOLOG FLEXPEN 100 UNIT/ML FlexPen, INJECT 6 UNITS INTO THE SKIN 3 (THREE) TIMES DAILY WITH MEALS., Disp: 15 mL, Rfl: 0 .  Omega-3 1000 MG CAPS, Take 1,000 mg by mouth daily., Disp: , Rfl:  .  sertraline (ZOLOFT) 50 MG tablet,  TAKE 1 TABLET BY MOUTH DAILY (Patient taking differently: Take 50 mg by mouth daily. ), Disp: 90 tablet, Rfl: 1 .  tamsulosin (FLOMAX) 0.4 MG CAPS capsule, Take 1 capsule (0.4 mg total) by mouth daily., Disp: 90 capsule, Rfl: 3 .  vitamin B-12 (CYANOCOBALAMIN) 1000 MCG tablet, Take 1,000 mcg by mouth daily., Disp: , Rfl:  .  acetaminophen (TYLENOL) 500 MG tablet, Take 2 tablets (1,000 mg total) by mouth every 6 (six) hours. (Patient not taking: Reported on 04/16/2020), Disp: 30 tablet, Rfl: 0 .  apixaban (ELIQUIS) 5 MG TABS tablet, Take 1 tablet (5 mg total) by mouth 2 (two) times daily. (Patient taking differently: Take 5 mg by mouth 2 (two) times daily. Pt asked for refills), Disp: 60 tablet, Rfl: 0 .  meclizine (ANTIVERT) 25 MG tablet, TAKE 1 TABLET (25 MG TOTAL) BY MOUTH 3 (THREE) TIMES DAILY AS NEEDED FOR DIZZINESS. (Patient not taking: Reported on 04/16/2020), Disp: 30 tablet, Rfl: 0 .  metoprolol tartrate (LOPRESSOR) 25 MG tablet, Take 1 tablet (25 mg total) by mouth 2 (two) times daily. (Patient not taking: Reported on 04/16/2020), Disp: 180 tablet, Rfl: 3 .  oxyCODONE (OXY IR/ROXICODONE) 5 MG immediate release tablet, Take 1 tablet (5 mg total) by mouth every 6 (six) hours as needed for severe pain. (Patient not taking: Reported on 04/16/2020),  Disp: 20 tablet, Rfl: 0 .  pantoprazole (PROTONIX) 20 MG tablet, TAKE 1 TABLET EVERY DAY (Patient not taking: Reported on 04/16/2020), Disp: 90 tablet, Rfl: 1 No current facility-administered medications for this encounter.  Facility-Administered Medications Ordered in Other Encounters:  .  0.9 %  sodium chloride infusion, , Intravenous, Continuous, Derek Jack, MD, Last Rate: 20 mL/hr at 12/30/18 1351, New Bag at 12/30/18 1351 .  0.9 %  sodium chloride infusion, , Intravenous, Continuous, Derek Jack, MD, Last Rate: 20 mL/hr at 12/30/18 1401, New Bag at 12/30/18 1401 .  dextrose 5 % solution, , Intravenous, Once, Derek Jack,  MD .  heparin lock flush 100 unit/mL, 500 Units, Intracatheter, Once PRN, Derek Jack, MD .  sodium chloride flush (NS) 0.9 % injection 10 mL, 10 mL, Intracatheter, PRN, Derek Jack, MD, 10 mL at 12/30/18 0911 .  sodium chloride flush (NS) 0.9 % injection 10 mL, 10 mL, Intracatheter, PRN, Derek Jack, MD, 10 mL at 05/21/19 0845  Past Medical History: Past Medical History:  Diagnosis Date  . CAD (coronary artery disease)    a. 01/2020: CABG x4 with LIMA-LAD, SVG-OM, SVG-PDA and SVG-D1  . Carotid artery obstruction, left    Left ICA occlusion  . Cervical compression fracture ([place])   . Cervical spinal stenosis   . Coronary artery disease   . Diabetic neuropathy (HCC)    Bilateral legs  . Diverticulitis   . Family history of pancreatic cancer   . History of kidney stones   . Hypothyroidism   . Pancreatic cancer ([place])   . Stenosis of left vertebral artery   . 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 Stroke [facility])    2011  . Type 2 diabetes mellitus (HCC)     Tobacco Use: Social History   Tobacco Use  Smoking Status Former Smoker  . Start date: 10/06/2019  Smokeless Tobacco Never Used    Labs: Recent Review Flowsheet Data    Labs for ITP Cardiac and Pulmonary Rehab Latest Ref Rng & Units 01/26/2020 01/26/2020 01/26/2020 01/26/2020 01/26/2020   Cholestrol 0 - 200 mg/dL - - - - -   LDLCALC 0 - 99 mg/dL - - - - -   HDL >40 mg/dL - - - - -   Trlycerides <150 mg/dL - - - - -   Hemoglobin A1c 4.8 - 5.6 % - - - - -   PHART 7.35 - 7.45 - 7.398 7.280(L) 7.310(L) 7.314(L)   PCO2ART 32 - 48 mmHg - 41.9 51.4(H) 47.0 45.9   HCO3 20.0 - 28.0 mmol/L - 26.0 24.0 23.4 23.1   TCO2 22 - 32 mmol/L 27 27 25 25 24    ACIDBASEDEF 0.0 - 2.0 mmol/L - - 3.0(H) 3.0(H) 3.0(H)   O2SAT % - 99.0 98.0 98.0 96.0      Capillary Blood Glucose: Lab Results  Component Value Date   GLUCAP 152 (H) 01/31/2020   GLUCAP 97 01/31/2020   GLUCAP 89 01/30/2020   GLUCAP 186 (H) 01/30/2020    GLUCAP 254 (H) 01/30/2020     Exercise Target Goals: Exercise Program Goal: Individual exercise prescription set using results from initial 6 min walk test and THRR while considering  patient's activity barriers and safety.   Exercise Prescription Goal: Starting with aerobic activity 30 plus minutes a day, 3 days per week for initial exercise prescription. Provide home exercise prescription and guidelines that participant acknowledges understanding prior to discharge.  Activity Barriers & Risk Stratification:   6 Minute Walk:  6  Minute Walk    Row Name 04/16/20 1109         6 Minute Walk   Phase Initial     Distance 1300 feet     Walk Time 6 minutes     # of Rest Breaks 0     MPH 2.46     METS 3.45     RPE 12.07     VO2 Peak 11     Symptoms No     Resting HR 92 bpm     Resting BP 100/60     Resting Oxygen Saturation  98 %     Exercise Oxygen Saturation  during 6 min walk 100 %     Max Ex. HR 98 bpm     Max Ex. BP 108/62     2 Minute Post BP 98/64            Oxygen Initial Assessment:   Oxygen Re-Evaluation:   Oxygen Discharge (Final Oxygen Re-Evaluation):   Initial Exercise Prescription:  Initial Exercise Prescription - 04/16/20 1100      Date of Initial Exercise RX and Referring Provider   Date 04/16/20    Referring Provider [name]    Expected Discharge Date 07/17/20      Treadmill   MPH 1.7    Grade 0    Minutes 17    METs 2.3      NuStep   Level 2    SPM 80    Minutes 22    METs 2.5      Prescription Details   Frequency (times per week) 3    Duration Progress to 30 minutes of continuous aerobic without signs/symptoms of physical distress      Intensity   THRR 40-80% of Max Heartrate 62-124    Ratings of Perceived Exertion 11-13    Perceived Dyspnea 0-4      Progression   Progression Continue progressive overload as per policy without signs/symptoms or physical distress.      Resistance Training   Training Prescription Yes     Weight 3    Reps 10-15           Perform Capillary Blood Glucose checks as needed.  Exercise Prescription Changes:   Exercise Comments:   Exercise Goals and Review:   Exercise Goals    Row Name 04/16/20 1114             Exercise Goals   Increase Physical Activity Yes       Intervention Provide advice, education, support and counseling about physical activity/exercise needs.;Develop an individualized exercise prescription for aerobic and resistive training based on initial evaluation findings, risk stratification, comorbidities and participant's personal goals.       Expected Outcomes Short Term: Attend rehab on a regular basis to increase amount of physical activity.;Long Term: Add in home exercise to make exercise part of routine and to increase amount of physical activity.;Long Term: Exercising regularly at least 3-5 days a week.       Increase Strength and Stamina Yes       Intervention Provide advice, education, support and counseling about physical activity/exercise needs.;Develop an individualized exercise prescription for aerobic and resistive training based on initial evaluation findings, risk stratification, comorbidities and participant's personal goals.       Expected Outcomes Short Term: Increase workloads from initial exercise prescription for resistance, speed, and METs.;Short Term: Perform resistance training exercises routinely during rehab and add in resistance training at home;Long Term: Improve cardiorespiratory  fitness, muscular endurance and strength as measured by increased METs and functional capacity (6MWT)       Able to understand and use rate of perceived exertion (RPE) scale Yes       Intervention Provide education and explanation on how to use RPE scale       Expected Outcomes Short Term: Able to use RPE daily in rehab to express subjective intensity level;Long Term:  Able to use RPE to guide intensity level when exercising independently       Knowledge  and understanding of Target Heart Rate Range (THRR) Yes       Intervention Provide education and explanation of THRR including how the numbers were predicted and where they are located for reference       Expected Outcomes Short Term: Able to state/look up THRR;Short Term: Able to use daily as guideline for intensity in rehab;Long Term: Able to use THRR to govern intensity when exercising independently       Able to check pulse independently Yes       Intervention Provide education and demonstration on how to check pulse in carotid and radial arteries.;Review the importance of being able to check your own pulse for safety during independent exercise       Expected Outcomes Short Term: Able to explain why pulse checking is important during independent exercise;Long Term: Able to check pulse independently and accurately       Understanding of Exercise Prescription Yes       Intervention Provide education, explanation, and written materials on patient's individual exercise prescription       Expected Outcomes Short Term: Able to explain program exercise prescription;Long Term: Able to explain home exercise prescription to exercise independently              Exercise Goals Re-Evaluation :    Discharge Exercise Prescription (Final Exercise Prescription Changes):   Nutrition:  Target Goals: Understanding of nutrition guidelines, daily intake of sodium 1500mg , cholesterol 200mg , calories 30% from fat and 7% or less from saturated fats, daily to have 5 or more servings of fruits and vegetables.  Biometrics:  Pre Biometrics - 04/16/20 1115      Pre Biometrics   Height 5\' 10"  (1.778 m)    Weight 71.2 kg    Waist Circumference 34.5 inches    Hip Circumference 38.5 inches    Waist to Hip Ratio 0.9 %    BMI (Calculated) 22.51    Triceps Skinfold 16 mm    % Body Fat 23.5 %    Grip Strength 34.6 kg    Flexibility 0 in    Single Leg Stand 9.3 seconds            Nutrition Therapy Plan  and Nutrition Goals:  Nutrition Therapy & Goals - 04/16/20 1024      Personal Nutrition Goals   Comments Patient scored 6 on his medficits diet assessment. He is trying to follow a diabetic heart healthy diet. Hand out given for making healthier choices for shopping and cooking. Will continue to monitor.      Intervention Plan   Intervention Nutrition handout(s) given to patient.           Nutrition Assessments:  Nutrition Assessments - 04/16/20 1025      MEDFICTS Scores   Pre Score 6           Nutrition Goals Re-Evaluation:   Nutrition Goals Discharge (Final Nutrition Goals Re-Evaluation):   Psychosocial: Target Goals: Acknowledge presence or  absence of significant depression and/or stress, maximize coping skills, provide positive support system. Participant is able to verbalize types and ability to use techniques and skills needed for reducing stress and depression.  Initial Review & Psychosocial Screening:  Initial Psych Review & Screening - 04/16/20 1028      Initial Review   Current issues with None Identified      Family Dynamics   Good Support System? Yes    Comments Patient has no psychosocial barriers identified at his orientation visit. He moved here about 3 years ago to be with his girl friend. He says he has a lot of friends and is active in his chruch. 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 He has a very positive about his life. Will continue to monitor.      Barriers   Psychosocial barriers to participate in program There are no identifiable barriers or psychosocial needs.      Screening Interventions   Interventions Encouraged to exercise    Expected Outcomes Short Term goal: Identification and review with participant of any Quality of Life or Depression concerns found by scoring the questionnaire.;Long Term goal: The participant improves quality of Life and PHQ9 Scores as seen by post scores and/or verbalization of changes            Quality of Life Scores:  Quality of Life - 04/16/20 1116      Quality of Life   Select Quality of Life      Quality of Life Scores   Health/Function Pre 23.3 %    Socioeconomic Pre 27.43 %    Psych/Spiritual Pre 29.14 %    Family Pre 27.4 %    GLOBAL Pre 25.96 %          Scores of 19 and below usually indicate a poorer quality of life in these areas.  A difference of  2-3 points is a clinically meaningful difference.  A difference of 2-3 points in the total score of the Quality of Life Index has been associated with significant improvement in overall quality of life, self-image, physical symptoms, and general health in studies assessing change in quality of life.  PHQ-9: Recent Review Flowsheet Data    Depression screen [facility] 2/9 04/16/2020 10/21/2019 09/30/2019   Decreased Interest 0 0 0   Down, Depressed, Hopeless 0 0 2   PHQ - 2 Score 0 0 2   Altered sleeping 1 - 3   Tired, decreased energy 0 - 2   Change in appetite 0 - 0   Feeling bad or failure about yourself  0 - 1   Trouble concentrating 0 - 0   Moving slowly or fidgety/restless 0 - 0   Suicidal thoughts 0 - 0   PHQ-9 Score 1 - 8   Difficult doing work/chores Not difficult at all - Somewhat difficult     Interpretation of Total Score  Total Score Depression Severity:  1-4 = Minimal depression, 5-9 = Mild depression, 10-14 = Moderate depression, 15-19 = Moderately severe depression, 20-27 = Severe depression   Psychosocial Evaluation and Intervention:  Psychosocial Evaluation - 04/16/20 1031      Psychosocial Evaluation & Interventions   Interventions Encouraged to exercise with the program and follow exercise prescription;Stress management education;Relaxation education    Comments Patient has no psychosical issues identified at his orientation visit. His initial QOL score was 25.96% and his PHQ-9 score was 1. Will continue to monitor.    Expected Outcomes Patient  will have no psychosocial issues identified at  discharge.    Continue Psychosocial Services  No Follow up required           Psychosocial Re-Evaluation:   Psychosocial Discharge (Final Psychosocial Re-Evaluation):   Vocational Rehabilitation: Provide vocational rehab assistance to qualifying candidates.   Vocational Rehab Evaluation & Intervention:  Vocational Rehab - 04/16/20 1026      Initial Vocational Rehab Evaluation & Intervention   Assessment shows need for Vocational Rehabilitation No      Vocational Rehab Re-Evaulation   Comments Patient is disabled and is not interested in returning to work. He does not need vocational rehab.           Education: Education Goals: Education classes will be provided on a weekly basis, covering required topics. Participant will state understanding/return demonstration of topics presented.  Learning Barriers/Preferences:  Learning Barriers/Preferences - 04/16/20 1025      Learning Barriers/Preferences   Learning Barriers None    Learning Preferences Written Material;Audio;Skilled Demonstration           Education Topics: Hypertension, Hypertension Reduction -Define heart disease and high blood pressure. Discus how high blood pressure affects the body and ways to reduce high blood pressure.   Exercise and Your Heart -Discuss why it is important to exercise, the FITT principles of exercise, normal and abnormal responses to exercise, and how to exercise safely.   Angina -Discuss definition of angina, causes of angina, treatment of angina, and how to decrease risk of having angina.   Cardiac Medications -Review what the following cardiac medications are used for, how they affect the body, and side effects that may occur when taking the medications.  Medications include Aspirin, Beta blockers, calcium channel blockers, ACE Inhibitors, angiotensin receptor blockers, diuretics, digoxin, and antihyperlipidemics.   Congestive Heart Failure -Discuss the definition of CHF,  how to live with CHF, the signs and symptoms of CHF, and how keep track of weight and sodium intake.   Heart Disease and Intimacy -Discus the effect sexual activity has on the heart, how changes occur during intimacy as we age, and safety during sexual activity.   Smoking Cessation / COPD -Discuss different methods to quit smoking, the health benefits of quitting smoking, and the definition of COPD.   Nutrition I: Fats -Discuss the types of cholesterol, what cholesterol does to the heart, and how cholesterol levels can be controlled.   Nutrition II: Labels -Discuss the different components of food labels and how to read food label   Heart Parts/Heart Disease and PAD -Discuss the anatomy of the heart, the pathway of blood circulation through the heart, and these are affected by heart disease.   Stress I: Signs and Symptoms -Discuss the causes of stress, how stress may lead to anxiety and depression, and ways to limit stress.   Stress II: Relaxation -Discuss different types of relaxation techniques to limit stress.   Warning Signs of Stroke / TIA -Discuss definition of a stroke, what the signs and symptoms are of a stroke, and how to identify when someone is having stroke.   Knowledge Questionnaire Score:  Knowledge Questionnaire Score - 04/16/20 1025      Knowledge Questionnaire Score   Pre Score 18/24           Core Components/Risk Factors/Patient Goals at Admission:  Personal Goals and Risk Factors at Admission - 04/16/20 1026      Core Components/Risk Factors/Patient Goals on Admission    Weight Management Weight Maintenance  Diabetes Yes    Intervention Provide education about signs/symptoms and action to take for hypo/hyperglycemia.;Provide education about proper nutrition, including hydration, and aerobic/resistive exercise prescription along with prescribed medications to achieve blood glucose in normal ranges: Fasting glucose 65-99 mg/dL    Expected  Outcomes Short Term: Participant verbalizes understanding of the signs/symptoms and immediate care of hyper/hypoglycemia, proper foot care and importance of medication, aerobic/resistive exercise and nutrition plan for blood glucose control.    Personal Goal Other Yes    Personal Goal Get stronger to do the things he want to do.    Intervention Patient will attend CR 3 days/week and supplement with exercise 2 days/week at home.    Expected Outcomes Patient will meet both program and personal goals.           Core Components/Risk Factors/Patient Goals Review:    Core Components/Risk Factors/Patient Goals at Discharge (Final Review):    ITP Comments:   Comments: Patient arrived for 1st visit/orientation/education at 0800. Patient was referred to CR by Dr. Hurshel Party due to CABGx4 (Z95.1). During orientation advised patient on arrival and appointment times what to wear, what to do before, during and after exercise. Reviewed attendance and class policy. Talked about inclement weather and class consultation policy. Pt is scheduled to return Cardiac Rehab on 04/19/20 at 11:00. Pt was advised to come to class 15 minutes before class starts. Patient was also given instructions on meeting with the dietician and attending the Family Structure classes. Discussed RPE/Dpysnea scales. Discussed initial THR and how to find their radial and/or carotid pulse. Discussed the initial exercise prescription and how this effects their progress. Pt is eager to get started. Patient participated in warm up stretches followed by light weights and resistance bands. Patient was able to complete 6 minute walk test.  Patient was measured for the equipment. Discussed equipment safety with patient. Took patient pre-anthropometric measurements. Patient finished visit at 1000.

## 2020-04-16 NOTE — Progress Notes (Signed)
Cardiac/Pulmonary Rehab Medication Review by a Pharmacist  Does the patient  feel that his/her medications are working for him/her?  yes  Has the patient been experiencing any side effects to the medications prescribed?  yes  Does the patient measure his/her own blood pressure or blood glucose at home?  yes   Does the patient have any problems obtaining medications due to transportation or finances?   yes  Understanding of regimen: excellent Understanding of indications: excellent Potential of compliance: excellent  Questions asked to Determine Patient Understanding of Medication Regimen:  1. What is the name of the medication?  2. What is the medication used for?  3. When should it be taken?  4. How much should be taken?  5. How will you take it?  6. What side effects should you report?  Understanding Defined as: Excellent: All questions above are correct Good: Questions 1-4 are correct Fair: Questions 1-2 are correct  Poor: 1 or none of the above questions are correct   Pharmacist comments: Pt was very knowledgeable about medications.  He monitors his blood sugar and heart rate.    Despina Pole 04/16/2020 1:05 PM

## 2020-04-19 ENCOUNTER — Encounter (HOSPITAL_COMMUNITY)
Admission: RE | Admit: 2020-04-19 | Discharge: 2020-04-19 | Disposition: A | Discharge status: Home / Self Care | Payer: Medicare HMO | Source: Ambulatory Visit | Attending: Internal Medicine | Admitting: Internal Medicine

## 2020-04-19 ENCOUNTER — Other Ambulatory Visit: Payer: Self-pay

## 2020-04-19 DIAGNOSIS — Z951 Presence of aortocoronary bypass graft: Secondary | ICD-10-CM

## 2020-04-19 NOTE — Progress Notes (Signed)
Daily Session Note  Patient Details  Name: Mark Foster MRN: [id number] Date of Birth: [birth year] Referring Provider:     CARDIAC REHAB PHASE II EXERCISE from 04/16/2020 in [place]  Referring Provider [name]      Encounter Date: 04/19/2020  Check In:  Session Check In - 04/19/20 1100      Check-In   Supervising physician immediately available to respond to emergencies See telemetry face sheet for immediately available MD    Staff Present Algis Downs, Exercise Physiologist;Dalton Kris Mouton, MS, ACSM-CEP, Exercise Physiologist    Virtual Visit No    Medication changes reported     No    Fall or balance concerns reported    No    Tobacco Cessation No Change    Warm-up and Cool-down Performed as group-led instruction    Resistance Training Performed Yes    VAD Patient? No    PAD/SET Patient? No      Pain Assessment   Currently in Pain? No/denies    Pain Score 0-No pain    Multiple Pain Sites No           Capillary Blood Glucose: No results found for this or any previous visit (from the past 24 hour(s)).    Social History   Tobacco Use  Smoking Status Former Smoker  . Start date: 10/06/2019  Smokeless Tobacco Never Used    Goals Met:  Independence with exercise equipment Exercise tolerated well No report of cardiac concerns or symptoms Strength training completed today  Goals Unmet:  Not Applicable  Comments: Check out: 1200   Dr. [name] is Medical Director for [facility].

## 2020-04-21 ENCOUNTER — Other Ambulatory Visit: Payer: Self-pay

## 2020-04-21 ENCOUNTER — Encounter (HOSPITAL_COMMUNITY)
Admission: RE | Admit: 2020-04-21 | Discharge: 2020-04-21 | Disposition: A | Discharge status: Home / Self Care | Payer: Medicare HMO | Source: Ambulatory Visit | Attending: Internal Medicine | Admitting: Internal Medicine

## 2020-04-21 DIAGNOSIS — Z951 Presence of aortocoronary bypass graft: Secondary | ICD-10-CM

## 2020-04-21 NOTE — Progress Notes (Signed)
Daily Session Note  Patient Details  Name: Mark Foster MRN: [id number] Date of Birth: [birth year] Referring Provider:     CARDIAC REHAB PHASE II EXERCISE from 04/16/2020 in [place]  Referring Provider [name]      Encounter Date: 04/21/2020  Check In:  Session Check In - 04/21/20 1108      Check-In   Supervising physician immediately available to respond to emergencies See telemetry face sheet for immediately available MD    Location [facility]    Staff Present [name], MS, ACSM-CEP, Exercise Physiologist;Debra Wynetta Emery, RN, BSN    Virtual Visit No    Medication changes reported     No    Fall or balance concerns reported    No    Tobacco Cessation No Change    Warm-up and Cool-down Performed as group-led instruction    Resistance Training Performed Yes    VAD Patient? No    PAD/SET Patient? No      Pain Assessment   Currently in Pain? No/denies    Pain Score 0-No pain    Multiple Pain Sites No           Capillary Blood Glucose: No results found for this or any previous visit (from the past 24 hour(s)).    Social History   Tobacco Use  Smoking Status Former Smoker  . Start date: 10/06/2019  Smokeless Tobacco Never Used    Goals Met:  Independence with exercise equipment Exercise tolerated well No report of cardiac concerns or symptoms Strength training completed today  Goals Unmet:  Not Applicable  Comments: checkout time is 1200   Dr. [name] is Medical Director for [facility].

## 2020-04-23 ENCOUNTER — Encounter (HOSPITAL_COMMUNITY): Payer: Medicare HMO

## 2020-04-23 ENCOUNTER — Ambulatory Visit
Admission: RE | Admit: 2020-04-23 | Discharge: 2020-04-23 | Disposition: A | Discharge status: Home / Self Care | Payer: Medicare HMO | Source: Ambulatory Visit | Attending: Adult Health | Admitting: Adult Health

## 2020-04-23 ENCOUNTER — Other Ambulatory Visit: Payer: Self-pay

## 2020-04-23 DIAGNOSIS — I639 Cerebral infarction, unspecified: Secondary | ICD-10-CM | POA: Diagnosis not present

## 2020-04-23 DIAGNOSIS — G9389 Other specified disorders of brain: Secondary | ICD-10-CM | POA: Diagnosis not present

## 2020-04-23 MED ORDER — GADOBENATE DIMEGLUMINE 529 MG/ML IV SOLN
14.0000 mL | Freq: Once | INTRAVENOUS | Status: AC | PRN
  Administered 2020-04-23: 14 mL via INTRAVENOUS

## 2020-04-26 ENCOUNTER — Encounter (HOSPITAL_COMMUNITY)
Admission: RE | Admit: 2020-04-26 | Discharge: 2020-04-26 | Disposition: A | Discharge status: Home / Self Care | Payer: Medicare HMO | Source: Ambulatory Visit | Attending: Internal Medicine | Admitting: Internal Medicine

## 2020-04-26 ENCOUNTER — Other Ambulatory Visit: Payer: Self-pay

## 2020-04-26 VITALS — Wt 157.4 lb

## 2020-04-26 DIAGNOSIS — Z951 Presence of aortocoronary bypass graft: Secondary | ICD-10-CM | POA: Diagnosis not present

## 2020-04-26 NOTE — Progress Notes (Signed)
Daily Session Note  Patient Details  Name: Mark Foster MRN: [id number] Date of Birth: 1955/09/12 Referring Provider:     CARDIAC REHAB PHASE II EXERCISE from 04/16/2020 in [place]  Referring Provider [name]      Encounter Date: 04/26/2020  Check In:  Session Check In - 04/26/20 1104      Check-In   Supervising physician immediately available to respond to emergencies See telemetry face sheet for immediately available MD    Location [facility]    Staff Present [name], MS, ACSM-CEP, Exercise Physiologist;Debra Wynetta Emery, RN, BSN    Virtual Visit No    Medication changes reported     No    Fall or balance concerns reported    No    Tobacco Cessation No Change    Warm-up and Cool-down Performed as group-led instruction    Resistance Training Performed Yes    VAD Patient? No    PAD/SET Patient? No      Pain Assessment   Currently in Pain? No/denies    Pain Score 0-No pain    Multiple Pain Sites No           Capillary Blood Glucose: No results found for this or any previous visit (from the past 24 hour(s)).    Social History   Tobacco Use  Smoking Status Former Smoker  . Start date: 10/06/2019  Smokeless Tobacco Never Used    Goals Met:  Independence with exercise equipment Exercise tolerated well No report of cardiac concerns or symptoms Strength training completed today  Goals Unmet:  Not Applicable  Comments: checkout time is 1200   Dr. [name] is Medical Director for [facility].

## 2020-04-28 ENCOUNTER — Encounter (HOSPITAL_COMMUNITY)
Admission: RE | Admit: 2020-04-28 | Discharge: 2020-04-28 | Disposition: A | Discharge status: Home / Self Care | Payer: Medicare HMO | Source: Ambulatory Visit | Attending: Internal Medicine | Admitting: Internal Medicine

## 2020-04-28 ENCOUNTER — Other Ambulatory Visit: Payer: Self-pay

## 2020-04-28 DIAGNOSIS — Z951 Presence of aortocoronary bypass graft: Secondary | ICD-10-CM | POA: Diagnosis not present

## 2020-04-30 ENCOUNTER — Other Ambulatory Visit: Payer: Self-pay

## 2020-04-30 ENCOUNTER — Encounter (HOSPITAL_COMMUNITY)
Admission: RE | Admit: 2020-04-30 | Discharge: 2020-04-30 | Disposition: A | Discharge status: Home / Self Care | Payer: Medicare HMO | Source: Ambulatory Visit | Attending: Internal Medicine | Admitting: Internal Medicine

## 2020-04-30 DIAGNOSIS — Z951 Presence of aortocoronary bypass graft: Secondary | ICD-10-CM | POA: Diagnosis not present

## 2020-04-30 NOTE — Progress Notes (Signed)
Daily Session Note  Patient Details  Name: Mark Foster MRN: [id number] Date of Birth: 13-Jun-1956 Referring Provider:     CARDIAC REHAB PHASE II EXERCISE from 04/16/2020 in [place]  Referring Provider [name]      Encounter Date: 04/30/2020  Check In:  Session Check In - 04/30/20 1057      Check-In   Supervising physician immediately available to respond to emergencies See telemetry face sheet for immediately available MD    Location [facility]    Staff Present [name], MS, ACSM-CEP, Exercise Physiologist;Debra Wynetta Emery, RN, BSN    Virtual Visit No    Medication changes reported     No    Fall or balance concerns reported    No    Tobacco Cessation No Change    Warm-up and Cool-down Performed as group-led instruction    Resistance Training Performed Yes    VAD Patient? No    PAD/SET Patient? No      Pain Assessment   Currently in Pain? No/denies    Pain Score 0-No pain    Multiple Pain Sites No           Capillary Blood Glucose: No results found for this or any previous visit (from the past 24 hour(s)).    Social History   Tobacco Use  Smoking Status Former Smoker  . Start date: 10/06/2019  Smokeless Tobacco Never Used    Goals Met:  Independence with exercise equipment Exercise tolerated well No report of cardiac concerns or symptoms Strength training completed today  Goals Unmet:  Not Applicable  Comments: checkout time is 1200   Dr. [name] is Medical Director for [facility].

## 2020-05-03 ENCOUNTER — Other Ambulatory Visit (INDEPENDENT_AMBULATORY_CARE_PROVIDER_SITE_OTHER): Payer: Self-pay | Admitting: Internal Medicine

## 2020-05-03 ENCOUNTER — Encounter (HOSPITAL_COMMUNITY)
Admission: RE | Admit: 2020-05-03 | Discharge: 2020-05-03 | Disposition: A | Discharge status: Home / Self Care | Payer: Medicare HMO | Source: Ambulatory Visit | Attending: Internal Medicine | Admitting: Internal Medicine

## 2020-05-03 ENCOUNTER — Other Ambulatory Visit: Payer: Self-pay

## 2020-05-03 DIAGNOSIS — Z951 Presence of aortocoronary bypass graft: Secondary | ICD-10-CM

## 2020-05-03 NOTE — Progress Notes (Signed)
Daily Session Note  Patient Details  Name: Mark Foster MRN: [id number] Date of Birth: 06/23/1956 Referring Provider:     CARDIAC REHAB PHASE II EXERCISE from 04/16/2020 in [place]  Referring Provider [name]      Encounter Date: 05/03/2020  Check In:  Session Check In - 05/03/20 1102      Check-In   Supervising physician immediately available to respond to emergencies See telemetry face sheet for immediately available MD    Location [facility]    Staff Present [name], MS, ACSM-CEP, Exercise Physiologist;Debra Wynetta Emery, RN, BSN    Virtual Visit No    Medication changes reported     No    Fall or balance concerns reported    No    Tobacco Cessation No Change    Warm-up and Cool-down Performed as group-led instruction    Resistance Training Performed Yes    VAD Patient? No    PAD/SET Patient? No      Pain Assessment   Currently in Pain? No/denies    Pain Score 0-No pain    Multiple Pain Sites No           Capillary Blood Glucose: No results found for this or any previous visit (from the past 24 hour(s)).    Social History   Tobacco Use  Smoking Status Former Smoker  . Start date: 10/06/2019  Smokeless Tobacco Never Used    Goals Met:  Independence with exercise equipment Exercise tolerated well No report of cardiac concerns or symptoms Strength training completed today  Goals Unmet:  Not Applicable  Comments: checkout time is 1200   Dr. [name] is Medical Director for [facility].

## 2020-05-03 NOTE — Progress Notes (Signed)
I have reviewed a Home Exercise Prescription with [name] . [name] is  currently exercising at home.  The patient was advised to walk and use the stepper 2-4 days a week for 30-45 minutes.  [name] and I discussed how to progress their exercise prescription.  The patient stated that their goals were to get stronger and to increase stamina.  The patient stated that they understand the exercise prescription.  We reviewed exercise guidelines, target heart rate during exercise, RPE Scale, weather conditions, NTG use, endpoints for exercise, warmup and cool down.  Patient is encouraged to come to me with any questions. I will continue to follow up with the patient to assist them with progression and safety.

## 2020-05-05 ENCOUNTER — Encounter (HOSPITAL_COMMUNITY)
Admission: RE | Admit: 2020-05-05 | Discharge: 2020-05-05 | Disposition: A | Discharge status: Home / Self Care | Payer: Medicare HMO | Source: Ambulatory Visit | Attending: Internal Medicine | Admitting: Internal Medicine

## 2020-05-05 ENCOUNTER — Other Ambulatory Visit: Payer: Self-pay

## 2020-05-05 DIAGNOSIS — Z951 Presence of aortocoronary bypass graft: Secondary | ICD-10-CM | POA: Insufficient documentation

## 2020-05-05 NOTE — Progress Notes (Signed)
Daily Session Note  Patient Details  Name: Mark Foster MRN: [id number] Date of Birth: [birth year] Referring Provider:     CARDIAC REHAB PHASE II EXERCISE from 04/16/2020 in [place]  Referring Provider [name]      Encounter Date: 05/05/2020  Check In:  Session Check In - 05/05/20 1100      Check-In   Supervising physician immediately available to respond to emergencies See telemetry face sheet for immediately available MD    Location [facility]    Staff Present [name], MS, ACSM-CEP, Exercise Physiologist;Teia Freitas Wynetta Emery, RN, BSN    Virtual Visit No    Medication changes reported     No    Fall or balance concerns reported    No    Tobacco Cessation No Change    Warm-up and Cool-down Performed as group-led instruction    Resistance Training Performed Yes    VAD Patient? No    PAD/SET Patient? No      Pain Assessment   Currently in Pain? No/denies    Pain Score 0-No pain    Multiple Pain Sites No           Capillary Blood Glucose: No results found for this or any previous visit (from the past 24 hour(s)).    Social History   Tobacco Use  Smoking Status Former Smoker  . Start date: 10/06/2019  Smokeless Tobacco Never Used    Goals Met:  Independence with exercise equipment Exercise tolerated well No report of cardiac concerns or symptoms Strength training completed today  Goals Unmet:  Not Applicable  Comments: Check out 1200.   Dr. [name] is Medical Director for [facility].

## 2020-05-05 NOTE — Progress Notes (Signed)
Cardiac Individual Treatment Plan  Patient Details  Name: Mark Foster MRN: [id number] Date of Birth: [birth year] Referring Provider:     CARDIAC REHAB PHASE II EXERCISE from 04/16/2020 in [facility]  Referring Provider [name]      Initial Encounter Date:    CARDIAC REHAB PHASE II EXERCISE from 04/16/2020 in [facility]  Date 04/16/20      Visit Diagnosis: S/P CABG x 4  Patient's Home Medications on Admission:  Current Outpatient Medications:  .  acetaminophen (TYLENOL) 500 MG tablet, Take 2 tablets (1,000 mg total) by mouth every 6 (six) hours. (Patient not taking: Reported on 04/16/2020), Disp: 30 tablet, Rfl: 0 .  apixaban (ELIQUIS) 5 MG TABS tablet, Take 1 tablet (5 mg total) by mouth 2 (two) times daily. (Patient taking differently: Take 5 mg by mouth 2 (two) times daily. Pt asked for refills), Disp: 60 tablet, Rfl: 0 .  aspirin EC 81 MG EC tablet, Take 1 tablet (81 mg total) by mouth daily., Disp: , Rfl:  .  atorvastatin (LIPITOR) 80 MG tablet, Take 1 tablet (80 mg total) by mouth daily at 6 PM., Disp: 90 tablet, Rfl: 1 .  clopidogrel (PLAVIX) 75 MG tablet, Take 75 mg by mouth daily., Disp: , Rfl:  .  ezetimibe (ZETIA) 10 MG tablet, Take 1 tablet (10 mg total) by mouth daily., Disp: 90 tablet, Rfl: 1 .  gabapentin (NEURONTIN) 300 MG capsule, Take 1 capsule (300 mg total) by mouth 2 (two) times daily., Disp: 180 capsule, Rfl: 0 .  insulin glargine (LANTUS SOLOSTAR) 100 UNIT/ML Solostar Pen, Inject 36 Units into the skin at bedtime., Disp: 5 pen, Rfl: 2 .  Insulin Pen Needle 32G X 4 MM MISC, 1 Device by Does not apply route daily., Disp: 50 each, Rfl: 2 .  levETIRAcetam (KEPPRA XR) 500 MG 24 hr tablet, Take 1 tablet (500 mg total) by mouth daily., Disp: 90 each, Rfl: 3 .  levothyroxine (SYNTHROID) 50 MCG tablet, Take 1 tablet (50 mcg total) by mouth daily., Disp: 90 tablet, Rfl: 1 .  lidocaine-prilocaine (EMLA) cream, Apply 1  application topically daily as needed (prior to port being accessed (~every 3 months))., Disp: , Rfl:  .  meclizine (ANTIVERT) 25 MG tablet, TAKE 1 TABLET (25 MG TOTAL) BY MOUTH 3 (THREE) TIMES DAILY AS NEEDED FOR DIZZINESS. (Patient not taking: Reported on 04/16/2020), Disp: 30 tablet, Rfl: 0 .  metoprolol tartrate (LOPRESSOR) 25 MG tablet, Take 1 tablet (25 mg total) by mouth 2 (two) times daily. (Patient not taking: Reported on 04/16/2020), Disp: 180 tablet, Rfl: 3 .  NOVOLOG FLEXPEN 100 UNIT/ML FlexPen, INJECT 6 UNITS INTO THE SKIN 3 (THREE) TIMES DAILY WITH MEALS., Disp: 15 mL, Rfl: 0 .  Omega-3 1000 MG CAPS, Take 1,000 mg by mouth daily., Disp: , Rfl:  .  oxyCODONE (OXY IR/ROXICODONE) 5 MG immediate release tablet, Take 1 tablet (5 mg total) by mouth every 6 (six) hours as needed for severe pain. (Patient not taking: Reported on 04/16/2020), Disp: 20 tablet, Rfl: 0 .  pantoprazole (PROTONIX) 20 MG tablet, TAKE 1 TABLET EVERY DAY (Patient not taking: Reported on 04/16/2020), Disp: 90 tablet, Rfl: 1 .  sertraline (ZOLOFT) 50 MG tablet, TAKE 1 TABLET BY MOUTH DAILY (Patient taking differently: Take 50 mg by mouth daily. ), Disp: 90 tablet, Rfl: 1 .  tamsulosin (FLOMAX) 0.4 MG CAPS capsule, Take 1 capsule (0.4 mg total) by mouth daily., Disp: 90 capsule, Rfl: 3 .  vitamin B-12 (CYANOCOBALAMIN) 1000 MCG tablet, Take 1,000 mcg by mouth daily., Disp: , Rfl:  No current facility-administered medications for this encounter.  Facility-Administered Medications Ordered in Other Encounters:  .  0.9 %  sodium chloride infusion, , Intravenous, Continuous, Derek Jack, MD, Last Rate: 20 mL/hr at 12/30/18 1351, New Bag at 12/30/18 1351 .  0.9 %  sodium chloride infusion, , Intravenous, Continuous, Derek Jack, MD, Last Rate: 20 mL/hr at 12/30/18 1401, New Bag at 12/30/18 1401 .  dextrose 5 % solution, , Intravenous, Once, Derek Jack, MD .  heparin lock flush 100 unit/mL, 500 Units,  Intracatheter, Once PRN, Derek Jack, MD .  sodium chloride flush (NS) 0.9 % injection 10 mL, 10 mL, Intracatheter, PRN, Derek Jack, MD, 10 mL at 12/30/18 0911 .  sodium chloride flush (NS) 0.9 % injection 10 mL, 10 mL, Intracatheter, PRN, Derek Jack, MD, 10 mL at 05/21/19 0845  Past Medical History: Past Medical History:  Diagnosis Date  . CAD (coronary artery disease)    a. 01/2020: CABG x4 with LIMA-LAD, SVG-OM, SVG-PDA and SVG-D1  . Carotid artery obstruction, left    Left ICA occlusion  . Cervical compression fracture ([place])   . Cervical spinal stenosis   . Coronary artery disease   . Diabetic neuropathy (HCC)    Bilateral legs  . Diverticulitis   . Family history of pancreatic cancer   . History of kidney stones   . Hypothyroidism   . Pancreatic cancer ([place])   . Stenosis of left vertebral artery   . 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 Stroke [facility])    2011  . Type 2 diabetes mellitus (HCC)     Tobacco Use: Social History   Tobacco Use  Smoking Status Former Smoker  . Start date: 10/06/2019  Smokeless Tobacco Never Used    Labs: Recent Review Flowsheet Data    Labs for ITP Cardiac and Pulmonary Rehab Latest Ref Rng & Units 01/26/2020 01/26/2020 01/26/2020 01/26/2020 01/26/2020   Cholestrol 0 - 200 mg/dL - - - - -   LDLCALC 0 - 99 mg/dL - - - - -   HDL >40 mg/dL - - - - -   Trlycerides <150 mg/dL - - - - -   Hemoglobin A1c 4.8 - 5.6 % - - - - -   PHART 7.35 - 7.45 - 7.398 7.280(L) 7.310(L) 7.314(L)   PCO2ART 32 - 48 mmHg - 41.9 51.4(H) 47.0 45.9   HCO3 20.0 - 28.0 mmol/L - 26.0 24.0 23.4 23.1   TCO2 22 - 32 mmol/L 27 27 25 25 24    ACIDBASEDEF 0.0 - 2.0 mmol/L - - 3.0(H) 3.0(H) 3.0(H)   O2SAT % - 99.0 98.0 98.0 96.0      Capillary Blood Glucose: Lab Results  Component Value Date   GLUCAP 152 (H) 01/31/2020   GLUCAP 97 01/31/2020   GLUCAP 89 01/30/2020   GLUCAP 186 (H) 01/30/2020   GLUCAP 254 (H) 01/30/2020     Exercise Target  Goals: Exercise Program Goal: Individual exercise prescription set using results from initial 6 min walk test and THRR while considering  patient's activity barriers and safety.   Exercise Prescription Goal: Starting with aerobic activity 30 plus minutes a day, 3 days per week for initial exercise prescription. Provide home exercise prescription and guidelines that participant acknowledges understanding prior to discharge.  Activity Barriers & Risk Stratification:   6 Minute Walk:  6 Minute Walk    Row Name 04/16/20 1109  6 Minute Walk   Phase Initial     Distance 1300 feet     Walk Time 6 minutes     # of Rest Breaks 0     MPH 2.46     METS 3.45     RPE 12.07     VO2 Peak 11     Symptoms No     Resting HR 92 bpm     Resting BP 100/60     Resting Oxygen Saturation  98 %     Exercise Oxygen Saturation  during 6 min walk 100 %     Max Ex. HR 98 bpm     Max Ex. BP 108/62     2 Minute Post BP 98/64            Oxygen Initial Assessment:   Oxygen Re-Evaluation:   Oxygen Discharge (Final Oxygen Re-Evaluation):   Initial Exercise Prescription:  Initial Exercise Prescription - 04/16/20 1100      Date of Initial Exercise RX and Referring Provider   Date 04/16/20    Referring Provider [name]    Expected Discharge Date 07/17/20      Treadmill   MPH 1.7    Grade 0    Minutes 17    METs 2.3      NuStep   Level 2    SPM 80    Minutes 22    METs 2.5      Prescription Details   Frequency (times per week) 3    Duration Progress to 30 minutes of continuous aerobic without signs/symptoms of physical distress      Intensity   THRR 40-80% of Max Heartrate 62-124    Ratings of Perceived Exertion 11-13    Perceived Dyspnea 0-4      Progression   Progression Continue progressive overload as per policy without signs/symptoms or physical distress.      Resistance Training   Training Prescription Yes    Weight 3    Reps 10-15           Perform  Capillary Blood Glucose checks as needed.  Exercise Prescription Changes:   Exercise Prescription Changes    Row Name 04/26/20 1200 05/03/20 1100           Response to Exercise   Blood Pressure (Admit) 116/64 --      Blood Pressure (Exercise) 140/64 --      Blood Pressure (Exit) 102/64 --      Heart Rate (Admit) 92 bpm --      Heart Rate (Exercise) 98 bpm --      Heart Rate (Exit) 109 bpm --      Rating of Perceived Exertion (Exercise) 10 --      Duration Continue with 30 min of aerobic exercise without signs/symptoms of physical distress. --      Intensity THRR unchanged --        Progression   Progression Continue to progress workloads to maintain intensity without signs/symptoms of physical distress. --        Horticulturist, commercial Prescription Yes --      Weight 3 --      Reps 10-15 --        Treadmill   MPH 1.9 --      Grade 0 --      Minutes 17 --      METs 2.45 --        NuStep   Level 2 --  SPM 121 --      Minutes 22 --      METs 2.9 --        Home Exercise Plan   Plans to continue exercise at -- [employer] (comment)      Frequency -- Add 2 additional days to program exercise sessions.      Initial Home Exercises Provided -- 05/03/20             Exercise Comments:   Exercise Comments    Row Name 05/03/20 1126           Exercise Comments home exercise reviewed              Exercise Goals and Review:   Exercise Goals    Row Name 04/16/20 1114 05/03/20 1154           Exercise Goals   Increase Physical Activity Yes Yes      Intervention Provide advice, education, support and counseling about physical activity/exercise needs.;Develop an individualized exercise prescription for aerobic and resistive training based on initial evaluation findings, risk stratification, comorbidities and participant's personal goals. Provide advice, education, support and counseling about physical activity/exercise needs.;Develop an  individualized exercise prescription for aerobic and resistive training based on initial evaluation findings, risk stratification, comorbidities and participant's personal goals.      Expected Outcomes Short Term: Attend rehab on a regular basis to increase amount of physical activity.;Long Term: Add in home exercise to make exercise part of routine and to increase amount of physical activity.;Long Term: Exercising regularly at least 3-5 days a week. Short Term: Attend rehab on a regular basis to increase amount of physical activity.;Long Term: Add in home exercise to make exercise part of routine and to increase amount of physical activity.;Long Term: Exercising regularly at least 3-5 days a week.      Increase Strength and Stamina Yes Yes      Intervention Provide advice, education, support and counseling about physical activity/exercise needs.;Develop an individualized exercise prescription for aerobic and resistive training based on initial evaluation findings, risk stratification, comorbidities and participant's personal goals. Provide advice, education, support and counseling about physical activity/exercise needs.;Develop an individualized exercise prescription for aerobic and resistive training based on initial evaluation findings, risk stratification, comorbidities and participant's personal goals.      Expected Outcomes Short Term: Increase workloads from initial exercise prescription for resistance, speed, and METs.;Short Term: Perform resistance training exercises routinely during rehab and add in resistance training at home;Long Term: Improve cardiorespiratory fitness, muscular endurance and strength as measured by increased METs and functional capacity (6MWT) Short Term: Increase workloads from initial exercise prescription for resistance, speed, and METs.;Short Term: Perform resistance training exercises routinely during rehab and add in resistance training at home;Long Term: Improve  cardiorespiratory fitness, muscular endurance and strength as measured by increased METs and functional capacity (6MWT)      Able to understand and use rate of perceived exertion (RPE) scale Yes Yes      Intervention Provide education and explanation on how to use RPE scale Provide education and explanation on how to use RPE scale      Expected Outcomes Short Term: Able to use RPE daily in rehab to express subjective intensity level;Long Term:  Able to use RPE to guide intensity level when exercising independently Short Term: Able to use RPE daily in rehab to express subjective intensity level;Long Term:  Able to use RPE to guide intensity level when exercising independently  Knowledge and understanding of Target Heart Rate Range (THRR) Yes Yes      Intervention Provide education and explanation of THRR including how the numbers were predicted and where they are located for reference Provide education and explanation of THRR including how the numbers were predicted and where they are located for reference      Expected Outcomes Short Term: Able to state/look up THRR;Short Term: Able to use daily as guideline for intensity in rehab;Long Term: Able to use THRR to govern intensity when exercising independently Short Term: Able to state/look up THRR;Short Term: Able to use daily as guideline for intensity in rehab;Long Term: Able to use THRR to govern intensity when exercising independently      Able to check pulse independently Yes Yes      Intervention Provide education and demonstration on how to check pulse in carotid and radial arteries.;Review the importance of being able to check your own pulse for safety during independent exercise Provide education and demonstration on how to check pulse in carotid and radial arteries.;Review the importance of being able to check your own pulse for safety during independent exercise      Expected Outcomes Short Term: Able to explain why pulse checking is important  during independent exercise;Long Term: Able to check pulse independently and accurately Short Term: Able to explain why pulse checking is important during independent exercise;Long Term: Able to check pulse independently and accurately      Understanding of Exercise Prescription Yes Yes      Intervention Provide education, explanation, and written materials on patient's individual exercise prescription Provide education, explanation, and written materials on patient's individual exercise prescription      Expected Outcomes Short Term: Able to explain program exercise prescription;Long Term: Able to explain home exercise prescription to exercise independently Short Term: Able to explain program exercise prescription;Long Term: Able to explain home exercise prescription to exercise independently             Exercise Goals Re-Evaluation :  Exercise Goals Re-Evaluation    Row Name 05/03/20 1154             Exercise Goal Re-Evaluation   Exercise Goals Review Increase Physical Activity;Increase Strength and Stamina;Able to understand and use rate of perceived exertion (RPE) scale;Knowledge and understanding of Target Heart Rate Range (THRR);Able to check pulse independently;Understanding of Exercise Prescription       Comments Pt has attended 6 exercise sessions. He is motivated and is eager for workload increases. He currently exercises at 2.0 METs on the stepper. Will continue to monitor and progress as able.       Expected Outcomes Through exercise at rehab and beginning a home exercise program, the patient will reach their stated goals.               Discharge Exercise Prescription (Final Exercise Prescription Changes):  Exercise Prescription Changes - 05/03/20 1100      Home Exercise Plan   Plans to continue exercise at [facility] (comment)    Frequency Add 2 additional days to program exercise sessions.    Initial Home Exercises Provided 05/03/20           Nutrition:   Target Goals: Understanding of nutrition guidelines, daily intake of sodium 1500mg , cholesterol 200mg , calories 30% from fat and 7% or less from saturated fats, daily to have 5 or more servings of fruits and vegetables.  Biometrics:  Pre Biometrics - 04/16/20 1115      Pre Biometrics  Height 5\' 10"  (1.778 m)    Weight 71.2 kg    Waist Circumference 34.5 inches    Hip Circumference 38.5 inches    Waist to Hip Ratio 0.9 %    BMI (Calculated) 22.51    Triceps Skinfold 16 mm    % Body Fat 23.5 %    Grip Strength 34.6 kg    Flexibility 0 in    Single Leg Stand 9.3 seconds            Nutrition Therapy Plan and Nutrition Goals:  Nutrition Therapy & Goals - 05/03/20 1430      Personal Nutrition Goals   Comments Patient continues to follow a diabetic diet and says he also trys to eat heart healthy. He is trying to make healthier choices. Will continue to monitor.      Intervention Plan   Intervention Nutrition handout(s) given to patient.           Nutrition Assessments:  Nutrition Assessments - 04/16/20 1025      MEDFICTS Scores   Pre Score 6           Nutrition Goals Re-Evaluation:   Nutrition Goals Discharge (Final Nutrition Goals Re-Evaluation):   Psychosocial: Target Goals: Acknowledge presence or absence of significant depression and/or stress, maximize coping skills, provide positive support system. Participant is able to verbalize types and ability to use techniques and skills needed for reducing stress and depression.  Initial Review & Psychosocial Screening:  Initial Psych Review & Screening - 04/16/20 1028      Initial Review   Current issues with None Identified      Family Dynamics   Good Support System? Yes    Comments Patient has no psychosocial barriers identified at his orientation visit. He moved here about 3 years ago to be with his girl friend. He says he has a lot of friends and is active in his chruch. 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 He has a very positive about his life. Will continue to monitor.      Barriers   Psychosocial barriers to participate in program There are no identifiable barriers or psychosocial needs.      Screening Interventions   Interventions Encouraged to exercise    Expected Outcomes Short Term goal: Identification and review with participant of any Quality of Life or Depression concerns found by scoring the questionnaire.;Long Term goal: The participant improves quality of Life and PHQ9 Scores as seen by post scores and/or verbalization of changes           Quality of Life Scores:  Quality of Life - 04/16/20 1116      Quality of Life   Select Quality of Life      Quality of Life Scores   Health/Function Pre 23.3 %    Socioeconomic Pre 27.43 %    Psych/Spiritual Pre 29.14 %    Family Pre 27.4 %    GLOBAL Pre 25.96 %          Scores of 19 and below usually indicate a poorer quality of life in these areas.  A difference of  2-3 points is a clinically meaningful difference.  A difference of 2-3 points in the total score of the Quality of Life Index has been associated with significant improvement in overall quality of life, self-image, physical symptoms, and general health in studies assessing change in quality of life.  PHQ-9: Recent Review Flowsheet Data    Depression screen  [facility] 2/9 04/16/2020 10/21/2019 09/30/2019   Decreased Interest 0 0 0   Down, Depressed, Hopeless 0 0 2   PHQ - 2 Score 0 0 2   Altered sleeping 1 - 3   Tired, decreased energy 0 - 2   Change in appetite 0 - 0   Feeling bad or failure about yourself  0 - 1   Trouble concentrating 0 - 0   Moving slowly or fidgety/restless 0 - 0   Suicidal thoughts 0 - 0   PHQ-9 Score 1 - 8   Difficult doing work/chores Not difficult at all - Somewhat difficult     Interpretation of Total Score  Total Score Depression Severity:  1-4 = Minimal depression, 5-9 = Mild depression, 10-14 = Moderate depression,  15-19 = Moderately severe depression, 20-27 = Severe depression   Psychosocial Evaluation and Intervention:  Psychosocial Evaluation - 04/16/20 1031      Psychosocial Evaluation & Interventions   Interventions Encouraged to exercise with the program and follow exercise prescription;Stress management education;Relaxation education    Comments Patient has no psychosical issues identified at his orientation visit. His initial QOL score was 25.96% and his PHQ-9 score was 1. Will continue to monitor.    Expected Outcomes Patient will have no psychosocial issues identified at discharge.    Continue Psychosocial Services  No Follow up required           Psychosocial Re-Evaluation:  Psychosocial Re-Evaluation    [place] Name 05/03/20 1433             Psychosocial Re-Evaluation   Current issues with None Identified       Comments Patient's initial QOL score was 25.96 and his PHQ-9 score was 1. He continues to have no psychosocial issues identified. Will continue to monitor.       Expected Outcomes Patient will have no psychosocial issues identified at discharge.       Interventions Encouraged to attend Cardiac Rehabilitation for the exercise;Stress management education;Relaxation education       Continue Psychosocial Services  No Follow up required              Psychosocial Discharge (Final Psychosocial Re-Evaluation):  Psychosocial Re-Evaluation - 05/03/20 1433      Psychosocial Re-Evaluation   Current issues with None Identified    Comments Patient's initial QOL score was 25.96 and his PHQ-9 score was 1. He continues to have no psychosocial issues identified. Will continue to monitor.    Expected Outcomes Patient will have no psychosocial issues identified at discharge.    Interventions Encouraged to attend Cardiac Rehabilitation for the exercise;Stress management education;Relaxation education    Continue Psychosocial Services  No Follow up required           Vocational  Rehabilitation: Provide vocational rehab assistance to qualifying candidates.   Vocational Rehab Evaluation & Intervention:  Vocational Rehab - 04/16/20 1026      Initial Vocational Rehab Evaluation & Intervention   Assessment shows need for Vocational Rehabilitation No      Vocational Rehab Re-Evaulation   Comments Patient is disabled and is not interested in returning to work. He does not need vocational rehab.           Education: Education Goals: Education classes will be provided on a weekly basis, covering required topics. Participant will state understanding/return demonstration of topics presented.  Learning Barriers/Preferences:  Learning Barriers/Preferences - 04/16/20 1025      Learning Barriers/Preferences   Learning Barriers None  Learning Preferences Written Material;Audio;Skilled Demonstration           Education Topics: Hypertension, Hypertension Reduction -Define heart disease and high blood pressure. Discus how high blood pressure affects the body and ways to reduce high blood pressure.   Exercise and Your Heart -Discuss why it is important to exercise, the FITT principles of exercise, normal and abnormal responses to exercise, and how to exercise safely.   Angina -Discuss definition of angina, causes of angina, treatment of angina, and how to decrease risk of having angina.   Cardiac Medications -Review what the following cardiac medications are used for, how they affect the body, and side effects that may occur when taking the medications.  Medications include Aspirin, Beta blockers, calcium channel blockers, ACE Inhibitors, angiotensin receptor blockers, diuretics, digoxin, and antihyperlipidemics.   Congestive Heart Failure -Discuss the definition of CHF, how to live with CHF, the signs and symptoms of CHF, and how keep track of weight and sodium intake.   Heart Disease and Intimacy -Discus the effect sexual activity has on the heart, how  changes occur during intimacy as we age, and safety during sexual activity.   Smoking Cessation / COPD -Discuss different methods to quit smoking, the health benefits of quitting smoking, and the definition of COPD.   CARDIAC REHAB PHASE II EXERCISE from 04/21/2020 in [place]  Date 04/21/20  Educator DF  Instruction Review Code 2- Demonstrated Understanding      Nutrition I: Fats -Discuss the types of cholesterol, what cholesterol does to the heart, and how cholesterol levels can be controlled.   Nutrition II: Labels -Discuss the different components of food labels and how to read food label   Heart Parts/Heart Disease and PAD -Discuss the anatomy of the heart, the pathway of blood circulation through the heart, and these are affected by heart disease.   Stress I: Signs and Symptoms -Discuss the causes of stress, how stress may lead to anxiety and depression, and ways to limit stress.   Stress II: Relaxation -Discuss different types of relaxation techniques to limit stress.   Warning Signs of Stroke / TIA -Discuss definition of a stroke, what the signs and symptoms are of a stroke, and how to identify when someone is having stroke.   Knowledge Questionnaire Score:  Knowledge Questionnaire Score - 04/16/20 1025      Knowledge Questionnaire Score   Pre Score 18/24           Core Components/Risk Factors/Patient Goals at Admission:  Personal Goals and Risk Factors at Admission - 04/16/20 1026      Core Components/Risk Factors/Patient Goals on Admission    Weight Management Weight Maintenance    Diabetes Yes    Intervention Provide education about signs/symptoms and action to take for hypo/hyperglycemia.;Provide education about proper nutrition, including hydration, and aerobic/resistive exercise prescription along with prescribed medications to achieve blood glucose in normal ranges: Fasting glucose 65-99 mg/dL    Expected Outcomes Short Term:  Participant verbalizes understanding of the signs/symptoms and immediate care of hyper/hypoglycemia, proper foot care and importance of medication, aerobic/resistive exercise and nutrition plan for blood glucose control.    Personal Goal Other Yes    Personal Goal Get stronger to do the things he want to do.    Intervention Patient will attend CR 3 days/week and supplement with exercise 2 days/week at home.    Expected Outcomes Patient will meet both program and personal goals.  Core Components/Risk Factors/Patient Goals Review:   Goals and Risk Factor Review    Row Name 05/03/20 1434             Core Components/Risk Factors/Patient Goals Review   Personal Goals Review Diabetes;Other  Get stronger to do the things he wants to do.       Review Patient has completed 7 sessions losing 3 lbs since his initial visit. He is doing well in the program with consistent attendance with progression. He says he is enjoying the program and is feeling better. He feels like he could be working harder with more resistance on the NuStep. We will continue to progress him as tolerated. He continues to be followed by oncology routinely for f/u pancreatic cancer. Will continue to monitor for progress.       Expected Outcomes Patient will complete the program meeting both program and personal goals.              Core Components/Risk Factors/Patient Goals at Discharge (Final Review):   Goals and Risk Factor Review - 05/03/20 1434      Core Components/Risk Factors/Patient Goals Review   Personal Goals Review Diabetes;Other   Get stronger to do the things he wants to do.   Review Patient has completed 7 sessions losing 3 lbs since his initial visit. He is doing well in the program with consistent attendance with progression. He says he is enjoying the program and is feeling better. He feels like he could be working harder with more resistance on the NuStep. We will continue to progress him as  tolerated. He continues to be followed by oncology routinely for f/u pancreatic cancer. Will continue to monitor for progress.    Expected Outcomes Patient will complete the program meeting both program and personal goals.           ITP Comments:   Comments: ITP REVIEW Pt is making expected progress toward Cardiac Rehab goals after completing 7 sessions. Recommend continued exercise, life style modification, education, and increased stamina and strength.

## 2020-05-07 ENCOUNTER — Encounter (HOSPITAL_COMMUNITY)
Admission: RE | Admit: 2020-05-07 | Discharge: 2020-05-07 | Disposition: A | Discharge status: Home / Self Care | Payer: Medicare HMO | Source: Ambulatory Visit | Attending: Internal Medicine | Admitting: Internal Medicine

## 2020-05-07 ENCOUNTER — Other Ambulatory Visit: Payer: Self-pay

## 2020-05-07 DIAGNOSIS — Z951 Presence of aortocoronary bypass graft: Secondary | ICD-10-CM

## 2020-05-07 NOTE — Progress Notes (Signed)
Daily Session Note  Patient Details  Name: COLLIER BOHNET MRN: [id number] Date of Birth: 12/24/1955 Referring Provider:     CARDIAC REHAB PHASE II EXERCISE from 04/16/2020 in [facility]  Referring Provider [name]      Encounter Date: 05/07/2020  Check In:  Session Check In - 05/07/20 1000      Check-In   Supervising physician immediately available to respond to emergencies See telemetry face sheet for immediately available MD    Location [facility]    Staff Present [name], MS, ACSM-CEP, Exercise Physiologist;Katara Griner Wynetta Emery, RN, BSN    Virtual Visit No    Medication changes reported     No    Fall or balance concerns reported    No    Tobacco Cessation No Change    Warm-up and Cool-down Performed as group-led instruction    Resistance Training Performed Yes    VAD Patient? No    PAD/SET Patient? No      Pain Assessment   Currently in Pain? No/denies    Pain Score 0-No pain    Multiple Pain Sites No           Capillary Blood Glucose: No results found for this or any previous visit (from the past 24 hour(s)).    Social History   Tobacco Use  Smoking Status Former Smoker  . Start date: 10/06/2019  Smokeless Tobacco Never Used    Goals Met:  Independence with exercise equipment Exercise tolerated well No report of cardiac concerns or symptoms Strength training completed today  Goals Unmet:  Not Applicable  Comments: Check out 1200.   Dr. [name] is Medical Director for [facility] Pulmonary Rehab.

## 2020-05-10 ENCOUNTER — Encounter (HOSPITAL_COMMUNITY): Payer: Medicare HMO

## 2020-05-12 ENCOUNTER — Encounter (HOSPITAL_COMMUNITY)
Admission: RE | Admit: 2020-05-12 | Discharge: 2020-05-12 | Disposition: A | Discharge status: Home / Self Care | Payer: Medicare HMO | Source: Ambulatory Visit | Attending: Internal Medicine | Admitting: Internal Medicine

## 2020-05-12 ENCOUNTER — Other Ambulatory Visit: Payer: Self-pay

## 2020-05-12 VITALS — Wt 152.6 lb

## 2020-05-12 DIAGNOSIS — Z951 Presence of aortocoronary bypass graft: Secondary | ICD-10-CM | POA: Diagnosis not present

## 2020-05-12 NOTE — Progress Notes (Signed)
Daily Session Note  Patient Details  Name: Mark Foster MRN: [id number] Date of Birth: [birth year] Referring Provider:     CARDIAC REHAB PHASE II EXERCISE from 04/16/2020 in [facility]  Referring Provider [name]      Encounter Date: 05/12/2020  Check In:  Session Check In - 05/12/20 1100      Check-In   Supervising physician immediately available to respond to emergencies See telemetry face sheet for immediately available MD    [facility]    Staff Present [name], MS, ACSM-CEP, Exercise Physiologist;Carlette Wilber Oliphant, RN, BSN    Virtual Visit No    Medication changes reported     No    Fall or balance concerns reported    No    Tobacco Cessation No Change    Warm-up and Cool-down Performed as group-led instruction    Resistance Training Performed Yes    VAD Patient? No    PAD/SET Patient? No      Pain Assessment   Currently in Pain? No/denies           Capillary Blood Glucose: No results found for this or any previous visit (from the past 24 hour(s)).   Exercise Prescription Changes - 05/12/20 1300      Response to Exercise   Blood Pressure (Admit) 110/60    Blood Pressure (Exercise) 140/62    Blood Pressure (Exit) 92/60    Heart Rate (Admit) 90 bpm    Heart Rate (Exercise) 142 bpm    Heart Rate (Exit) 101 bpm    Rating of Perceived Exertion (Exercise) 10    Duration Continue with 30 min of aerobic exercise without signs/symptoms of physical distress.    Intensity THRR unchanged      Progression   Progression Continue to progress workloads to maintain intensity without signs/symptoms of physical distress.      Resistance Training   Training Prescription Yes    Weight 3 lbs    Reps 10-15      Treadmill   MPH 2.3    Grade 0    Minutes 17    METs 2.76      NuStep   Level 3    SPM 159    Minutes 22    METs 2.2           Social History   Tobacco Use  Smoking Status Former Smoker  .  Start date: 10/06/2019  Smokeless Tobacco Never Used    Goals Met:  Independence with exercise equipment Exercise tolerated well No report of cardiac concerns or symptoms Strength training completed today  Goals Unmet:  Not Applicable  Comments: checkout time is 1200   Dr. [name] is Medical Director for [facility] Pulmonary Rehab.

## 2020-05-14 ENCOUNTER — Other Ambulatory Visit: Payer: Self-pay

## 2020-05-14 ENCOUNTER — Encounter (HOSPITAL_COMMUNITY)
Admission: RE | Admit: 2020-05-14 | Discharge: 2020-05-14 | Disposition: A | Discharge status: Home / Self Care | Payer: Medicare HMO | Source: Ambulatory Visit | Attending: Internal Medicine | Admitting: Internal Medicine

## 2020-05-14 DIAGNOSIS — Z951 Presence of aortocoronary bypass graft: Secondary | ICD-10-CM | POA: Diagnosis not present

## 2020-05-14 NOTE — Progress Notes (Signed)
Daily Session Note  Patient Details  Name: NYGEL PROKOP MRN: [id number] Date of Birth: [birth year] Referring Provider:     CARDIAC REHAB PHASE II EXERCISE from 04/16/2020 in [place]  Referring Provider [name]      Encounter Date: 05/14/2020  Check In:  Session Check In - 05/14/20 1059      Check-In   Supervising physician immediately available to respond to emergencies See telemetry face sheet for immediately available MD    Location [facility]    Staff Present [name], MS, ACSM-CEP, Exercise Physiologist;Carlette Wilber Oliphant, RN, BSN    Virtual Visit No    Medication changes reported     No    Fall or balance concerns reported    No    Tobacco Cessation No Change    Warm-up and Cool-down Performed as group-led instruction    Resistance Training Performed Yes    VAD Patient? No    PAD/SET Patient? No      Pain Assessment   Currently in Pain? No/denies    Pain Score 0-No pain           Capillary Blood Glucose: No results found for this or any previous visit (from the past 24 hour(s)).    Social History   Tobacco Use  Smoking Status Former Smoker  . Start date: 10/06/2019  Smokeless Tobacco Never Used    Goals Met:  Independence with exercise equipment Exercise tolerated well No report of cardiac concerns or symptoms Strength training completed today  Goals Unmet:  Not Applicable  Comments: checkout time is 1200   Dr. [name] is Medical Director for [facility] Pulmonary Rehab.

## 2020-05-17 ENCOUNTER — Encounter (INDEPENDENT_AMBULATORY_CARE_PROVIDER_SITE_OTHER): Payer: Self-pay | Admitting: Internal Medicine

## 2020-05-17 ENCOUNTER — Other Ambulatory Visit: Payer: Self-pay

## 2020-05-17 ENCOUNTER — Ambulatory Visit (INDEPENDENT_AMBULATORY_CARE_PROVIDER_SITE_OTHER): Payer: Medicare HMO | Admitting: Internal Medicine

## 2020-05-17 ENCOUNTER — Encounter (HOSPITAL_COMMUNITY)
Admission: RE | Admit: 2020-05-17 | Discharge: 2020-05-17 | Disposition: A | Discharge status: Home / Self Care | Payer: Medicare HMO | Source: Ambulatory Visit | Attending: Internal Medicine | Admitting: Internal Medicine

## 2020-05-17 VITALS — BP 130/88 | HR 98 | Temp 96.6°F | Ht 70.0 in | Wt 161.0 lb

## 2020-05-17 DIAGNOSIS — E782 Mixed hyperlipidemia: Secondary | ICD-10-CM

## 2020-05-17 DIAGNOSIS — E039 Hypothyroidism, unspecified: Secondary | ICD-10-CM | POA: Diagnosis not present

## 2020-05-17 DIAGNOSIS — E119 Type 2 diabetes mellitus without complications: Secondary | ICD-10-CM | POA: Diagnosis not present

## 2020-05-17 DIAGNOSIS — I10 Essential (primary) hypertension: Secondary | ICD-10-CM

## 2020-05-17 DIAGNOSIS — Z951 Presence of aortocoronary bypass graft: Secondary | ICD-10-CM

## 2020-05-17 MED ORDER — APIXABAN 5 MG PO TABS: 5.0000 mg | ORAL_TABLET | Freq: Every day | ORAL | 3 refills | Status: DC

## 2020-05-17 NOTE — Progress Notes (Signed)
Daily Session Note  Patient Details  Name: Mark Foster MRN: [id number] Date of Birth: 03-Jun-1956 Referring Provider:     CARDIAC REHAB PHASE II EXERCISE from 04/16/2020 in [facility]  Referring Provider [name]      Encounter Date: 05/17/2020  Check In:  Session Check In - 05/17/20 1100      Check-In   Supervising physician immediately available to respond to emergencies See telemetry face sheet for immediately available MD    Location [facility]    Staff Present [name], MS, ACSM-CEP, Exercise Physiologist;Shar Paez Wynetta Emery, RN, BSN    Virtual Visit No    Medication changes reported     No    Fall or balance concerns reported    No    Tobacco Cessation No Change    Warm-up and Cool-down Performed as group-led instruction    Resistance Training Performed Yes    VAD Patient? No    PAD/SET Patient? No      Pain Assessment   Currently in Pain? No/denies    Pain Score 0-No pain    Multiple Pain Sites No           Capillary Blood Glucose: No results found for this or any previous visit (from the past 24 hour(s)).    Social History   Tobacco Use  Smoking Status Former Smoker  . Start date: 10/06/2019  Smokeless Tobacco Never Used    Goals Met:  Independence with exercise equipment Exercise tolerated well No report of cardiac concerns or symptoms Strength training completed today  Goals Unmet:  Not Applicable  Comments: Check out 1200.   Dr. [name] is Medical Director for [facility] Pulmonary Rehab.

## 2020-05-17 NOTE — Progress Notes (Signed)
Metrics: Intervention Frequency ACO  Documented Smoking Status Yearly  Screened one or more times in 24 months  Cessation Counseling or  Active cessation medication Past 24 months  Past 24 months   Guideline developer: UpToDate (See UpToDate for funding source) Date Released: 2014       Wellness Office Visit  Subjective:  Patient ID: Mark Foster, male    DOB: 05-25-1956  Age: 64 y.o. MRN: [id number]  CC: This man comes in after long hiatus for follow-up regarding his diabetes, hypothyroidism, hypertension and hyperlipidemia. HPI  He has had chemotherapy treatment for his pancreatic cancer and follows with oncology every 4 months now. He has not seen his endocrinologist for several months. He follows up with cardiology as he did have coronary artery disease with CABG in May 2021. He continues on insulin for his diabetes. He continues on statin therapy for his hyperlipidemia in the face of coronary artery disease. He continues on levothyroxine for his hypothyroidism. Past Medical History:  Diagnosis Date  . CAD (coronary artery disease)    a. 01/2020: CABG x4 with LIMA-LAD, SVG-OM, SVG-PDA and SVG-D1  . Carotid artery obstruction, left    Left ICA occlusion  . Cervical compression fracture ([place])   . Cervical spinal stenosis   . Coronary artery disease   . Diabetic neuropathy (HCC)    Bilateral legs  . Diverticulitis   . Family history of pancreatic cancer   . History of kidney stones   . Hypothyroidism   . Pancreatic cancer ([place])   . Stenosis of left vertebral artery   . 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 Stroke [facility]    2011  . Type 2 diabetes mellitus ([place])    Past Surgical History:  Procedure Laterality Date  . APPENDECTOMY    . BUBBLE STUDY N/A 11/18/2019   Procedure: BUBBLE STUDY;  Surgeon: Herminio Commons, MD;  Location: [facility];  Service: Cardiology;  Laterality: N/A;  . COLON RESECTION     For diverticulitis  . COLON SURGERY    . CORONARY ARTERY  BYPASS GRAFT N/A 01/26/2020   Procedure: CORONARY ARTERY BYPASS GRAFTING (CABG) TIMES FOUR USING LEFT INTERNAL MAMMARY VEIN AND RIGHT GREATER SAPHENOUS VEIN;  Surgeon: Melrose Nakayama, MD;  Location: [place];  Service: Open Heart Surgery;  Laterality: N/A;  . ENDOVEIN HARVEST OF GREATER SAPHENOUS VEIN Right 01/26/2020   Procedure: [place] [facility] Greater Saphenous Vein;  Surgeon: Melrose Nakayama, MD;  Location: [place];  Service: Open Heart Surgery;  Laterality: Right;  . ESOPHAGOGASTRODUODENOSCOPY N/A 10/03/2018   Procedure: ESOPHAGOGASTRODUODENOSCOPY (EGD);  Surgeon: [name], MD;  Location: [name];  Service: Endoscopy;  Laterality: N/A;  . EUS N/A 10/03/2018   Procedure: UPPER ENDOSCOPIC ULTRASOUND (EUS) RADIAL;  Surgeon: [name], MD;  Location: [facility];  Service: Endoscopy;  Laterality: N/A;  . EYE SURGERY Bilateral   . FINE NEEDLE ASPIRATION N/A 10/03/2018   Procedure: FINE NEEDLE ASPIRATION (FNA) LINEAR;  Surgeon: [name], MD;  Location: [facility];  Service: Endoscopy;  Laterality: N/A;  . IR ANGIO INTRA EXTRACRAN SEL COM CAROTID INNOMINATE BILAT MOD SED  01/21/2018  . IR ANGIO INTRA EXTRACRAN SEL COM CAROTID INNOMINATE UNI R MOD SED  03/21/2018  . IR ANGIO VERTEBRAL SEL VERTEBRAL BILAT MOD SED  01/21/2018  . IR TRANSCATH EXCRAN VERT OR CAR A STENT  03/21/2018  . LEFT HEART CATH AND CORONARY ANGIOGRAPHY N/A 01/08/2020   Procedure: LEFT HEART CATH AND CORONARY ANGIOGRAPHY;  Surgeon: Leonie Man, MD;  Location: [facility];  Service: Cardiovascular;  Laterality: N/A;  . PORTACATH PLACEMENT Left 12/23/2018   Procedure: INSERTION PORT-A-CATH;  Surgeon: Virl Cagey, MD;  Location: [facility];  Service: General;  Laterality: Left;  . RADIOLOGY WITH ANESTHESIA N/A 03/21/2018   Procedure: IR WITH ANESTHESIA WITH STENT PLACEMENT;  Surgeon: Luanne Bras, MD;  Location: [place];  Service: Radiology;  Laterality: N/A;  . ROTATOR CUFF REPAIR       Left  . SPLENECTOMY    . TEE WITHOUT CARDIOVERSION N/A 11/18/2019   Procedure: TRANSESOPHAGEAL ECHOCARDIOGRAM (TEE) WITH PROPOFOL;  Surgeon: Herminio Commons, MD;  Location: [facility];  Service: Cardiology;  Laterality: N/A;  . TEE WITHOUT CARDIOVERSION N/A 01/26/2020   Procedure: TRANSESOPHAGEAL ECHOCARDIOGRAM (TEE);  Surgeon: Melrose Nakayama, MD;  Location: [facility];  Service: Open Heart Surgery;  Laterality: N/A;  . TONSILLECTOMY       Family History  Problem Relation Age of Onset  . Stroke Mother   . Pancreatic cancer Father 59       d. 35  . Stroke Maternal Grandmother   . Heart attack Maternal Grandfather   . Heart Problems Paternal Grandfather   . Scoliosis Daughter   . Muscular dystrophy Grandson     Social History   Social History Narrative  . Not on file   Social History   Tobacco Use  . Smoking status: Former Smoker    Start date: 10/06/2019  . Smokeless tobacco: Never Used  Substance Use Topics  . Alcohol use: Yes    Alcohol/week: 1.0 standard drink    Types: 1 Cans of beer per week    Comment: rare     Current Meds  Medication Sig  . apixaban (ELIQUIS) 5 MG TABS tablet Take 1 tablet (5 mg total) by mouth daily.  Marland Kitchen aspirin EC 81 MG EC tablet Take 1 tablet (81 mg total) by mouth daily.  Marland Kitchen atorvastatin (LIPITOR) 80 MG tablet Take 1 tablet (80 mg total) by mouth daily at 6 PM.  . clopidogrel (PLAVIX) 75 MG tablet Take 75 mg by mouth daily.  Marland Kitchen ezetimibe (ZETIA) 10 MG tablet Take 1 tablet (10 mg total) by mouth daily.  Marland Kitchen gabapentin (NEURONTIN) 300 MG capsule Take 1 capsule (300 mg total) by mouth 2 (two) times daily.  . insulin glargine (LANTUS SOLOSTAR) 100 UNIT/ML Solostar Pen Inject 36 Units into the skin at bedtime.  . Insulin Pen Needle 32G X 4 MM MISC 1 Device by Does not apply route daily.  Marland Kitchen levETIRAcetam (KEPPRA XR) 500 MG 24 hr tablet Take 1 tablet (500 mg total) by mouth daily.  Marland Kitchen levothyroxine (SYNTHROID) 50 MCG tablet Take 1 tablet (50 mcg  total) by mouth daily.  Marland Kitchen lidocaine-prilocaine (EMLA) cream Apply 1 application topically daily as needed (prior to port being accessed (~every 3 months)).  Marland Kitchen NOVOLOG FLEXPEN 100 UNIT/ML FlexPen INJECT 6 UNITS INTO THE SKIN 3 (THREE) TIMES DAILY WITH MEALS.  Marland Kitchen Omega-3 1000 MG CAPS Take 1,000 mg by mouth daily.  . pantoprazole (PROTONIX) 20 MG tablet TAKE 1 TABLET EVERY DAY  . sertraline (ZOLOFT) 50 MG tablet TAKE 1 TABLET BY MOUTH DAILY (Patient taking differently: Take 50 mg by mouth daily. )  . tamsulosin (FLOMAX) 0.4 MG CAPS capsule Take 1 capsule (0.4 mg total) by mouth daily.  . vitamin B-12 (CYANOCOBALAMIN) 1000 MCG tablet Take 1,000 mcg by mouth daily.  . [DISCONTINUED] apixaban (ELIQUIS) 5 MG TABS  tablet Take 1 tablet (5 mg total) by mouth 2 (two) times daily. (Patient taking differently: Take 5 mg by mouth 2 (two) times daily. Pt asked for refills)      Depression screen [facility] 2/9 04/16/2020 10/21/2019 09/30/2019  Decreased Interest 0 0 0  Down, Depressed, Hopeless 0 0 2  PHQ - 2 Score 0 0 2  Altered sleeping 1 - 3  Tired, decreased energy 0 - 2  Change in appetite 0 - 0  Feeling bad or failure about yourself  0 - 1  Trouble concentrating 0 - 0  Moving slowly or fidgety/restless 0 - 0  Suicidal thoughts 0 - 0  PHQ-9 Score 1 - 8  Difficult doing work/chores Not difficult at all - Somewhat difficult  Some recent data might be hidden     Objective:   Today's Vitals: BP 130/88 (BP Location: Left Arm, Patient Position: Sitting, Cuff Size: Normal)   Pulse 98   Temp (!) 96.6 F (35.9 C) (Temporal)   Ht 5\' 10"  (1.778 m)   Wt 161 lb (73 kg)   SpO2 96%   BMI 23.10 kg/m  Vitals with BMI 05/17/2020 05/12/2020 04/26/2020  Height 5\' 10"  - -  Weight 161 lbs 152 lbs 9 oz 157 lbs 7 oz  BMI 23.1 59.45 85.92  Systolic 924 - -  Diastolic 88 - -  Pulse 98 - -     Physical Exam  He looks systemically well.  Diastolic blood pressure slightly elevated.  Alert and orientated without any  focal neurological signs     Assessment   1. Hypothyroidism, unspecified type   2. Diabetes mellitus type 2 in nonobese (HCC)   3. Essential hypertension, benign   4. Mixed hyperlipidemia       Tests ordered Orders Placed This Encounter  Procedures  . COMPLETE METABOLIC PANEL WITH GFR  . Hemoglobin A1c  . T3, free  . T4  . TSH  . Lipid panel     Plan: 1. He will continue with levothyroxine and we will check thyroid function test today. 2. He will continue with insulin for his diabetes and we will check an A1c today. 3. He will continue with statin therapy above and we will check a lipid panel. 4. Today I had a long conversation and counseled him regarding COVID-19 vaccination.  He had questions which I answered for him.  I stressed to him the importance of getting vaccinated and not doing so would be going [place]. 5. Further recommendations will depend on blood results and follow-up in 4 months.   Meds ordered this encounter  Medications  . apixaban (ELIQUIS) 5 MG TABS tablet    Sig: Take 1 tablet (5 mg total) by mouth daily.    Dispense:  30 tablet    Refill:  3    Savannah Erbe Luther Parody, MD

## 2020-05-18 ENCOUNTER — Emergency Department (HOSPITAL_COMMUNITY)
Admission: EM | Admit: 2020-05-18 | Discharge: 2020-05-18 | Disposition: A | Discharge status: Home / Self Care | Payer: Medicare HMO | Attending: Emergency Medicine | Admitting: Emergency Medicine

## 2020-05-18 ENCOUNTER — Encounter (HOSPITAL_COMMUNITY): Payer: Self-pay

## 2020-05-18 ENCOUNTER — Other Ambulatory Visit: Payer: Self-pay

## 2020-05-18 ENCOUNTER — Ambulatory Visit: Payer: Medicare HMO | Admitting: Student

## 2020-05-18 ENCOUNTER — Encounter: Payer: Self-pay | Admitting: Student

## 2020-05-18 ENCOUNTER — Telehealth (INDEPENDENT_AMBULATORY_CARE_PROVIDER_SITE_OTHER): Payer: Self-pay

## 2020-05-18 VITALS — BP 132/60 | HR 88 | Ht 70.0 in | Wt 159.2 lb

## 2020-05-18 DIAGNOSIS — E1165 Type 2 diabetes mellitus with hyperglycemia: Secondary | ICD-10-CM | POA: Diagnosis not present

## 2020-05-18 DIAGNOSIS — I82622 Acute embolism and thrombosis of deep veins of left upper extremity: Secondary | ICD-10-CM | POA: Diagnosis not present

## 2020-05-18 DIAGNOSIS — S99912A Unspecified injury of left ankle, initial encounter: Secondary | ICD-10-CM | POA: Insufficient documentation

## 2020-05-18 DIAGNOSIS — E782 Mixed hyperlipidemia: Secondary | ICD-10-CM | POA: Insufficient documentation

## 2020-05-18 DIAGNOSIS — Z23 Encounter for immunization: Secondary | ICD-10-CM

## 2020-05-18 DIAGNOSIS — E039 Hypothyroidism, unspecified: Secondary | ICD-10-CM | POA: Insufficient documentation

## 2020-05-18 DIAGNOSIS — I2510 Atherosclerotic heart disease of native coronary artery without angina pectoris: Secondary | ICD-10-CM

## 2020-05-18 DIAGNOSIS — C251 Malignant neoplasm of body of pancreas: Secondary | ICD-10-CM | POA: Insufficient documentation

## 2020-05-18 DIAGNOSIS — M4802 Spinal stenosis, cervical region: Secondary | ICD-10-CM | POA: Insufficient documentation

## 2020-05-18 DIAGNOSIS — I10 Essential (primary) hypertension: Secondary | ICD-10-CM

## 2020-05-18 DIAGNOSIS — R739 Hyperglycemia, unspecified: Secondary | ICD-10-CM

## 2020-05-18 DIAGNOSIS — E785 Hyperlipidemia, unspecified: Secondary | ICD-10-CM | POA: Diagnosis not present

## 2020-05-18 DIAGNOSIS — I639 Cerebral infarction, unspecified: Secondary | ICD-10-CM | POA: Insufficient documentation

## 2020-05-18 DIAGNOSIS — I672 Cerebral atherosclerosis: Secondary | ICD-10-CM | POA: Diagnosis not present

## 2020-05-18 DIAGNOSIS — Z955 Presence of coronary angioplasty implant and graft: Secondary | ICD-10-CM | POA: Insufficient documentation

## 2020-05-18 DIAGNOSIS — D696 Thrombocytopenia, unspecified: Secondary | ICD-10-CM | POA: Insufficient documentation

## 2020-05-18 DIAGNOSIS — X58XXXA Exposure to other specified factors, initial encounter: Secondary | ICD-10-CM | POA: Insufficient documentation

## 2020-05-18 DIAGNOSIS — R531 Weakness: Secondary | ICD-10-CM | POA: Diagnosis not present

## 2020-05-18 DIAGNOSIS — I209 Angina pectoris, unspecified: Secondary | ICD-10-CM | POA: Diagnosis not present

## 2020-05-18 DIAGNOSIS — D72819 Decreased white blood cell count, unspecified: Secondary | ICD-10-CM | POA: Diagnosis not present

## 2020-05-18 LAB — CBC WITH DIFFERENTIAL/PLATELET
Abs Immature Granulocytes: 0.01 10*3/uL (ref 0.00–0.07)
Basophils Absolute: 0 10*3/uL (ref 0.0–0.1)
Basophils Relative: 1 %
Eosinophils Absolute: 0.1 10*3/uL (ref 0.0–0.5)
Eosinophils Relative: 2 %
HCT: 41.3 % (ref 39.0–52.0)
Hemoglobin: 13.7 g/dL (ref 13.0–17.0)
Immature Granulocytes: 0 %
Lymphocytes Relative: 33 %
Lymphs Abs: 1.6 10*3/uL (ref 0.7–4.0)
MCH: 30.9 pg (ref 26.0–34.0)
MCHC: 33.2 g/dL (ref 30.0–36.0)
MCV: 93.2 fL (ref 80.0–100.0)
Monocytes Absolute: 0.5 10*3/uL (ref 0.1–1.0)
Monocytes Relative: 10 %
Neutro Abs: 2.7 10*3/uL (ref 1.7–7.7)
Neutrophils Relative %: 54 %
Platelets: 216 10*3/uL (ref 150–400)
RBC: 4.43 MIL/uL (ref 4.22–5.81)
RDW: 15.9 % — ABNORMAL HIGH (ref 11.5–15.5)
WBC: 4.9 10*3/uL (ref 4.0–10.5)
nRBC: 0 % (ref 0.0–0.2)

## 2020-05-18 LAB — T3, FREE: T3, Free: 2.7 pg/mL (ref 2.3–4.2)

## 2020-05-18 LAB — COMPREHENSIVE METABOLIC PANEL
ALT: 16 U/L (ref 0–44)
AST: 17 U/L (ref 15–41)
Albumin: 4 g/dL (ref 3.5–5.0)
Alkaline Phosphatase: 48 U/L (ref 38–126)
Anion gap: 8 (ref 5–15)
BUN: 14 mg/dL (ref 8–23)
CO2: 27 mmol/L (ref 22–32)
Calcium: 9 mg/dL (ref 8.9–10.3)
Chloride: 102 mmol/L (ref 98–111)
Creatinine, Ser: 0.84 mg/dL (ref 0.61–1.24)
GFR calc Af Amer: >60 mL/min (ref >60)
GFR calc non Af Amer: >60 mL/min (ref >60)
Glucose, Bld: 242 mg/dL — ABNORMAL HIGH (ref 70–99)
Potassium: 3.8 mmol/L (ref 3.5–5.1)
Sodium: 137 mmol/L (ref 135–145)
Total Bilirubin: 1.6 mg/dL — ABNORMAL HIGH (ref 0.3–1.2)
Total Protein: 6.5 g/dL (ref 6.5–8.1)

## 2020-05-18 LAB — CBG MONITORING, ED: Glucose-Capillary: 230 mg/dL — ABNORMAL HIGH (ref 70–99)

## 2020-05-18 LAB — COMPLETE METABOLIC PANEL WITH GFR
AG Ratio: 2 (calc) (ref 1.0–2.5)
ALT: 12 U/L (ref 9–46)
AST: 13 U/L (ref 10–35)
Albumin: 4.4 g/dL (ref 3.6–5.1)
Alkaline phosphatase (APISO): 65 U/L (ref 35–144)
BUN: 13 mg/dL (ref 7–25)
CO2: 29 mmol/L (ref 20–32)
Calcium: 9.8 mg/dL (ref 8.6–10.3)
Chloride: 102 mmol/L (ref 98–110)
Creat: 0.86 mg/dL (ref 0.70–1.25)
GFR, Est African American: 105 mL/min/{1.73_m2} (ref >=60)
GFR, Est Non African American: 91 mL/min/{1.73_m2} (ref >=60)
Globulin: 2.2 g/dL (calc) (ref 1.9–3.7)
Glucose, Bld: 551 mg/dL (ref 65–99)
Potassium: 5.6 mmol/L — ABNORMAL HIGH (ref 3.5–5.3)
Sodium: 138 mmol/L (ref 135–146)
Total Bilirubin: 0.8 mg/dL (ref 0.2–1.2)
Total Protein: 6.6 g/dL (ref 6.1–8.1)

## 2020-05-18 LAB — TSH: TSH: 0.33 mIU/L — ABNORMAL LOW (ref 0.40–4.50)

## 2020-05-18 LAB — HEMOGLOBIN A1C
Hgb A1c MFr Bld: 11.3 % of total Hgb — ABNORMAL HIGH (ref <5.7)
Mean Plasma Glucose: 278 (calc)
eAG (mmol/L): 15.4 (calc)

## 2020-05-18 LAB — T4: T4, Total: 6.9 ug/dL (ref 4.9–10.5)

## 2020-05-18 LAB — LIPID PANEL
Cholesterol: 111 mg/dL (ref <200)
HDL: 50 mg/dL (ref >=40)
LDL Cholesterol (Calc): 44 mg/dL (calc)
Non-HDL Cholesterol (Calc): 61 mg/dL (calc) (ref <130)
Total CHOL/HDL Ratio: 2.2 (calc) (ref <5.0)
Triglycerides: 90 mg/dL (ref <150)

## 2020-05-18 NOTE — Discharge Instructions (Signed)
Monitor glucose carefully.  Use sliding scale as directed.

## 2020-05-18 NOTE — ED Provider Notes (Signed)
[facility] EMERGENCY DEPARTMENT Provider Note   CSN: [id number] Arrival date & time: 05/18/20  1001     History Chief Complaint  Patient presents with  . Abnormal Lab    Mark Foster is a 64 y.o. male.  The history is provided by the patient. No language interpreter was used.  Abnormal Lab Time since result:  Yesterday Patient referred by:  PCP Result type: chemistry   Chemistry:    Potassium:  High   Glucose:  High  Pt reports his MD called him and advised him to come in for evaluation.  Pt's glucose was over 500 and potassium was 5.3     Pt denies any complaints.  Pt does admit he had sweet tea before labs were drawn.  Pt reports glucose lower today Past Medical History:  Diagnosis Date  . CAD (coronary artery disease)    a. 01/2020: CABG x4 with LIMA-LAD, SVG-OM, SVG-PDA and SVG-D1  . Carotid artery obstruction, left    Left ICA occlusion  . Cervical compression fracture ([place])   . Cervical spinal stenosis   . Coronary artery disease   . Diabetic neuropathy (HCC)    Bilateral legs  . Diverticulitis   . Family history of pancreatic cancer   . History of kidney stones   . Hypothyroidism   . Pancreatic cancer ([place])   . Stenosis of left vertebral artery   . 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 Stroke [facility])    2011  . Type 2 diabetes mellitus [facility])     Patient Active Problem List   Diagnosis Date Noted  . 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 Abnormal cardiac CT angiography 01/08/2020  . Angina pectoris ([place])   . CVA (cerebral vascular accident) ([place]) 10/08/2019  . Injury of left ankle 03/24/2019  . Port-A-Cath in place 12/19/2018  . Genetic testing 11/15/2018  . Loss of consciousness ([place]) 11/12/2018  . Generalized weakness 11/11/2018  . Family history of pancreatic cancer   . Hyperglycemia 10/09/2018  . Tobacco abuse counseling 10/09/2018  . Pancreatic adenocarcinoma ([place]) 09/03/2018  . H/O ETOH abuse 09/03/2018  . Hyperbilirubinemia 09/03/2018  . Vertebral  artery stenosis, symptomatic, without infarction, left 03/21/2018  . Uncontrolled type 2 diabetes mellitus with hyperglycemia ([place]) 03/05/2018  . Hypothyroidism 03/05/2018  . Intracranial atherosclerosis 01/22/2018  . Spinal stenosis in cervical region   . Diabetes mellitus type 2 in nonobese (HCC)   . Left carotid artery occlusion 01/21/2018  . Cervical stenosis of spinal canal 01/21/2018  . Right hand weakness 01/21/2018  . Numbness of right hand 01/21/2018  . Essential hypertension, benign 01/21/2018  . Mixed hyperlipidemia 01/21/2018  . Diabetes ([place]) 01/21/2018  . Hx of completed stroke 01/21/2018  . Leukopenia 01/21/2018  . Thrombocytopenia ([place]) 01/21/2018  . L MCA Stroke-like episode ([place]) s/p IV tPA 01/19/2018    Past Surgical History:  Procedure Laterality Date  . APPENDECTOMY    . BUBBLE STUDY N/A 11/18/2019   Procedure: BUBBLE STUDY;  Surgeon: Herminio Commons, MD;  Location: [facility];  Service: Cardiology;  Laterality: N/A;  . CARDIAC SURGERY    . COLON RESECTION     For diverticulitis  . COLON SURGERY    . CORONARY ARTERY BYPASS GRAFT N/A 01/26/2020   Procedure: CORONARY ARTERY BYPASS GRAFTING (CABG) TIMES FOUR USING LEFT INTERNAL MAMMARY VEIN AND RIGHT GREATER SAPHENOUS VEIN;  Surgeon: Melrose Nakayama, MD;  Location: [place];  Service: Open Heart Surgery;  Laterality: N/A;  . ENDOVEIN HARVEST  OF GREATER SAPHENOUS VEIN Right 01/26/2020   Procedure: [place] Of Greater Saphenous Vein;  Surgeon: Melrose Nakayama, MD;  Location: [place];  Service: Open Heart Surgery;  Laterality: Right;  . ESOPHAGOGASTRODUODENOSCOPY N/A 10/03/2018   Procedure: ESOPHAGOGASTRODUODENOSCOPY (EGD);  Surgeon: [name], MD;  Location: [name];  Service: Endoscopy;  Laterality: N/A;  . EUS N/A 10/03/2018   Procedure: UPPER ENDOSCOPIC ULTRASOUND (EUS) RADIAL;  Surgeon: [name], MD;  Location: [facility];  Service: Endoscopy;  Laterality: N/A;  . EYE SURGERY  Bilateral   . FINE NEEDLE ASPIRATION N/A 10/03/2018   Procedure: FINE NEEDLE ASPIRATION (FNA) LINEAR;  Surgeon: [name], MD;  Location: [facility];  Service: Endoscopy;  Laterality: N/A;  . IR ANGIO INTRA EXTRACRAN SEL COM CAROTID INNOMINATE BILAT MOD SED  01/21/2018  . IR ANGIO INTRA EXTRACRAN SEL COM CAROTID INNOMINATE UNI R MOD SED  03/21/2018  . IR ANGIO VERTEBRAL SEL VERTEBRAL BILAT MOD SED  01/21/2018  . IR TRANSCATH EXCRAN VERT OR CAR A STENT  03/21/2018  . LEFT HEART CATH AND CORONARY ANGIOGRAPHY N/A 01/08/2020   Procedure: LEFT HEART CATH AND CORONARY ANGIOGRAPHY;  Surgeon: Leonie Man, MD;  Location: [place] [facility];  Service: Cardiovascular;  Laterality: N/A;  . PORTACATH PLACEMENT Left 12/23/2018   Procedure: INSERTION PORT-A-CATH;  Surgeon: Virl Cagey, MD;  Location: [facility];  Service: General;  Laterality: Left;  . RADIOLOGY WITH ANESTHESIA N/A 03/21/2018   Procedure: IR WITH ANESTHESIA WITH STENT PLACEMENT;  Surgeon: Luanne Bras, MD;  Location: [place];  Service: Radiology;  Laterality: N/A;  . ROTATOR CUFF REPAIR     Left  . SPLENECTOMY    . TEE WITHOUT CARDIOVERSION N/A 11/18/2019   Procedure: TRANSESOPHAGEAL ECHOCARDIOGRAM (TEE) WITH PROPOFOL;  Surgeon: Herminio Commons, MD;  Location: [facility];  Service: Cardiology;  Laterality: N/A;  . TEE WITHOUT CARDIOVERSION N/A 01/26/2020   Procedure: TRANSESOPHAGEAL ECHOCARDIOGRAM (TEE);  Surgeon: Melrose Nakayama, MD;  Location: [place];  Service: Open Heart Surgery;  Laterality: N/A;  . TONSILLECTOMY         Family History  Problem Relation Age of Onset  . Stroke Mother   . Pancreatic cancer Father 40       d. 49  . Stroke Maternal Grandmother   . Heart attack Maternal Grandfather   . Heart Problems Paternal Grandfather   . Scoliosis Daughter   . Muscular dystrophy Grandson     Social History   Tobacco Use  . Smoking status: Former Smoker    Start date: 10/06/2019  . Smokeless tobacco: Never  Used  Vaping Use  . Vaping Use: Never used  Substance Use Topics  . Alcohol use: Yes    Alcohol/week: 1.0 standard drink    Types: 1 Cans of beer per week    Comment: rare   . Drug use: Never    Home Medications Prior to Admission medications   Medication Sig Start Date End Date Taking? Authorizing Provider  apixaban (ELIQUIS) 5 MG TABS tablet Take 1 tablet (5 mg total) by mouth daily. 05/17/20 06/16/20  Doree Albee, MD  aspirin EC 81 MG EC tablet Take 1 tablet (81 mg total) by mouth daily. 01/31/20   Elgie Collard, PA-C  atorvastatin (LIPITOR) 80 MG tablet Take 1 tablet (80 mg total) by mouth daily at 6 PM. 05/03/20   Anastasio Champion, Nimish C, MD  ezetimibe (ZETIA) 10 MG tablet Take 1 tablet (10 mg total) by  mouth daily. 04/08/20 07/07/20  Strader, Fransisco Hertz, PA-C  gabapentin (NEURONTIN) 300 MG capsule Take 1 capsule (300 mg total) by mouth 2 (two) times daily. 11/07/19   Frann Rider, NP  insulin glargine (LANTUS SOLOSTAR) 100 UNIT/ML Solostar Pen Inject 36 Units into the skin at bedtime. 01/20/20   Gosrani, Nimish C, MD  Insulin Pen Needle 32G X 4 MM MISC 1 Device by Does not apply route daily. 01/22/18   Donzetta Starch, NP  levETIRAcetam (KEPPRA XR) 500 MG 24 hr tablet Take 1 tablet (500 mg total) by mouth daily. 03/29/20   Frann Rider, NP  levothyroxine (SYNTHROID) 50 MCG tablet Take 1 tablet (50 mcg total) by mouth daily. 01/12/20   Doree Albee, MD  lidocaine-prilocaine (EMLA) cream Apply 1 application topically daily as needed (prior to port being accessed (~every 3 months)).    [provider]  metoprolol tartrate (LOPRESSOR) 25 MG tablet Take 1 tablet (25 mg total) by mouth 2 (two) times daily. 01/06/20 05/18/20  Herminio Commons, MD  NOVOLOG FLEXPEN 100 UNIT/ML FlexPen INJECT 6 UNITS INTO THE SKIN 3 (THREE) TIMES DAILY WITH MEALS. 03/17/20   Cassandria Anger, MD  Omega-3 1000 MG CAPS Take 1,000 mg by mouth daily.    [provider]  pantoprazole (PROTONIX)  20 MG tablet TAKE 1 TABLET EVERY DAY 03/17/20   Anastasio Champion, Nimish C, MD  sertraline (ZOLOFT) 50 MG tablet TAKE 1 TABLET BY MOUTH DAILY Patient taking differently: Take 50 mg by mouth daily.  07/05/19   Ailene Ards, NP  tamsulosin (FLOMAX) 0.4 MG CAPS capsule Take 1 capsule (0.4 mg total) by mouth daily. 11/07/19   McKenzie, Candee Furbish, MD  vitamin B-12 (CYANOCOBALAMIN) 1000 MCG tablet Take 1,000 mcg by mouth daily.    [provider]  atorvastatin (LIPITOR) 80 MG tablet Take 1 tablet (80 mg total) by mouth daily at 6 PM. 05/15/19   Doree Albee, MD    Allergies    Demerol [meperidine hcl]  Review of Systems   Review of Systems  All other systems reviewed and are negative.   Physical Exam Updated Vital Signs BP 128/77 (BP Location: Right Arm)   Pulse 80   Temp 98 F (36.7 C) (Oral)   Resp 14   Ht 5\' 10"  (1.778 m)   Wt 73 kg   SpO2 99%   BMI 23.09 kg/m   Physical Exam Vitals and nursing note reviewed.  Constitutional:      Appearance: He is well-developed.  HENT:     Head: Normocephalic and atraumatic.  Eyes:     Conjunctiva/sclera: Conjunctivae normal.  Cardiovascular:     Rate and Rhythm: Normal rate and regular rhythm.     Heart sounds: No murmur heard.   Pulmonary:     Effort: Pulmonary effort is normal. No respiratory distress.     Breath sounds: Normal breath sounds.  Abdominal:     Palpations: Abdomen is soft.     Tenderness: There is no abdominal tenderness.  Musculoskeletal:     Cervical back: Neck supple.  Skin:    General: Skin is warm and dry.  Neurological:     General: No focal deficit present.     Mental Status: He is alert.  Psychiatric:        Mood and Affect: Mood normal.     ED Results / Procedures / Treatments   Labs (all labs ordered are listed, but only abnormal results are displayed) Labs Reviewed  CBC WITH DIFFERENTIAL/PLATELET - Abnormal; Notable for the following components:      Result Value   RDW 15.9 (*)    All  other components within normal limits  COMPREHENSIVE METABOLIC PANEL - Abnormal; Notable for the following components:   Glucose, Bld 242 (*)    Total Bilirubin 1.6 (*)    All other components within normal limits  CBG MONITORING, ED - Abnormal; Notable for the following components:   Glucose-Capillary 230 (*)    All other components within normal limits    EKG None  Radiology No results found.  Procedures Procedures (including critical care time)  Medications Ordered in ED Medications - No data to display  ED Course  I have reviewed the triage vital signs and the nursing notes.  Pertinent labs & imaging results that were available during my care of the patient were reviewed by me and considered in my medical decision making (see chart for details).    MDM Rules/Calculators/A&P                          Pt's potassium is normal today.  Pt counseled on diabetic diet.   Final Clinical Impression(s) / ED Diagnoses Final diagnoses:  Hyperglycemia    Rx / DC Orders ED Discharge Orders    None    An After Visit Summary was printed and given to the patient.    Fransico Meadow, Hershal Coria 05/18/20 1816    Milton Ferguson, MD 05/19/20 1249

## 2020-05-18 NOTE — Patient Instructions (Signed)
Medication Instructions:   No changes.   Labwork:  None  Testing/Procedures:  None  Follow-Up:  With Dr. Domenic Polite in 6 months.   Any Other Special Instructions Will Be Listed Below (If Applicable).     If you need a refill on your cardiac medications before your next appointment, please call your pharmacy.

## 2020-05-18 NOTE — ED Triage Notes (Signed)
Pt reports Dr. Gearlean Alf called him this morning and told him to come to ed because his glucose and potassium were high.  Pt reports blood sugar at home today was 230.  Pt says feels "fine."

## 2020-05-18 NOTE — Progress Notes (Addendum)
Cardiology Office Note    Date:  05/18/2020   ID:  Mark Foster, DOB 08-Oct-1955, MRN [id number]  PCP:  Ailene Ards, NP  Cardiologist: Kate Sable, MD (Inactive)  --> Will switch to Dr. Domenic Polite  Chief Complaint  Patient presents with  . Follow-up    3 month visit    History of Present Illness:    Mark Foster is a 64 y.o. male with past medical history of CAD (significant stenosis of LAD, LCx and RCA by Coronary CT in 12/2019, s/p CABG in 01/2020 with LIMA-LAD, SVG-OM, SVG-PDA and SVG-D1), carotid artery stenosis (knonwn LICA occlusion), DVT of LUE (diagnosed in 01/2020), HTN, HLD, prior CVA (in 2011 and 10/2019) and history of pancreatic cancer (s/p Whipple procedure) who presents to the office today for 42-month follow-up.   He was last examined by myself in 02/2020 and denied any recent chest pain or dyspnea on exertion. He was continued on his current cardiac medications including ASA, Atorvastatin, Zetia, Eliquis and Lopressor.    In talking with the patient today, he reports overall doing well from a cardiac perspective since his last visit. He is participating in cardiac rehab and says he feels like his strength and stamina continue to improve but he does experience intermittent dyspnea which is typically most notable when walking on the treadmill for extended periods of time. He denies any associated chest pain or palpitations.  No recent orthopnea, PND or lower extremity edema.  He remains on Eliquis for anticoagulation along with ASA and denies any evidence of active bleeding.  He was evaluated in the ED earlier this morning for elevated glucose as this was at 551 when checked by his PCP yesterday, improved to 242 today. He says he had consumed sweet tea along with sandwiches from [employer] prior to his blood work and feels like this caused the significant elevation.   Past Medical History:  Diagnosis Date  . CAD (coronary artery disease)    a. 01/2020: CABG x4  with LIMA-LAD, SVG-OM, SVG-PDA and SVG-D1  . Carotid artery obstruction, left    Left ICA occlusion  . Cervical compression fracture ([place])   . Cervical spinal stenosis   . Coronary artery disease   . Diabetic neuropathy (HCC)    Bilateral legs  . Diverticulitis   . Family history of pancreatic cancer   . History of kidney stones   . Hypothyroidism   . Pancreatic cancer ([place])   . Stenosis of left vertebral artery   . Stroke (cerebrum) ([place]) 10/09/2019  . Stroke [facility])    2011  . Type 2 diabetes mellitus ([place])     Past Surgical History:  Procedure Laterality Date  . APPENDECTOMY    . BUBBLE STUDY N/A 11/18/2019   Procedure: BUBBLE STUDY;  Surgeon: Herminio Commons, MD;  Location: [facility];  Service: Cardiology;  Laterality: N/A;  . CARDIAC SURGERY    . COLON RESECTION     For diverticulitis  . COLON SURGERY    . CORONARY ARTERY BYPASS GRAFT N/A 01/26/2020   Procedure: CORONARY ARTERY BYPASS GRAFTING (CABG) TIMES FOUR USING LEFT INTERNAL MAMMARY VEIN AND RIGHT GREATER SAPHENOUS VEIN;  Surgeon: Melrose Nakayama, MD;  Location: [place];  Service: Open Heart Surgery;  Laterality: N/A;  . ENDOVEIN HARVEST OF GREATER SAPHENOUS VEIN Right 01/26/2020   Procedure: [place] Of Greater Saphenous Vein;  Surgeon: Melrose Nakayama, MD;  Location: [facility];  Service: Open Heart Surgery;  Laterality: Right;  .  ESOPHAGOGASTRODUODENOSCOPY N/A 10/03/2018   Procedure: ESOPHAGOGASTRODUODENOSCOPY (EGD);  Surgeon: [name], MD;  Location: [name];  Service: Endoscopy;  Laterality: N/A;  . EUS N/A 10/03/2018   Procedure: UPPER ENDOSCOPIC ULTRASOUND (EUS) RADIAL;  Surgeon: [name], MD;  Location: [facility];  Service: Endoscopy;  Laterality: N/A;  . EYE SURGERY Bilateral   . FINE NEEDLE ASPIRATION N/A 10/03/2018   Procedure: FINE NEEDLE ASPIRATION (FNA) LINEAR;  Surgeon: [name], MD;  [facility]: [facility];  Service: Endoscopy;  Laterality: N/A;  . IR ANGIO  INTRA EXTRACRAN SEL COM CAROTID INNOMINATE BILAT MOD SED  01/21/2018  . IR ANGIO INTRA EXTRACRAN SEL COM CAROTID INNOMINATE UNI R MOD SED  03/21/2018  . IR ANGIO VERTEBRAL SEL VERTEBRAL BILAT MOD SED  01/21/2018  . IR TRANSCATH EXCRAN VERT OR CAR A STENT  03/21/2018  . LEFT HEART CATH AND CORONARY ANGIOGRAPHY N/A 01/08/2020   Procedure: LEFT HEART CATH AND CORONARY ANGIOGRAPHY;  Surgeon: Leonie Man, MD;  Location: [facility];  Service: Cardiovascular;  Laterality: N/A;  . PORTACATH PLACEMENT Left 12/23/2018   Procedure: INSERTION PORT-A-CATH;  Surgeon: Virl Cagey, MD;  Location: [facility];  Service: General;  Laterality: Left;  . RADIOLOGY WITH ANESTHESIA N/A 03/21/2018   Procedure: IR WITH ANESTHESIA WITH STENT PLACEMENT;  Surgeon: Luanne Bras, MD;  Location: [place];  Service: Radiology;  Laterality: N/A;  . ROTATOR CUFF REPAIR     Left  . SPLENECTOMY    . TEE WITHOUT CARDIOVERSION N/A 11/18/2019   Procedure: TRANSESOPHAGEAL ECHOCARDIOGRAM (TEE) WITH PROPOFOL;  Surgeon: Herminio Commons, MD;  Location[facility];  Service: Cardiology;  Laterality: N/A;  . TEE WITHOUT CARDIOVERSION N/A 01/26/2020   Procedure: TRANSESOPHAGEAL ECHOCARDIOGRAM (TEE);  Surgeon: Melrose Nakayama, MD;  Location: [place];  Service: Open Heart Surgery;  Laterality: N/A;  . TONSILLECTOMY      Current Medications: Outpatient Medications Prior to Visit  Medication Sig Dispense Refill  . apixaban (ELIQUIS) 5 MG TABS tablet Take 1 tablet (5 mg total) by mouth daily. 30 tablet 3  . aspirin EC 81 MG EC tablet Take 1 tablet (81 mg total) by mouth daily.    Marland Kitchen atorvastatin (LIPITOR) 80 MG tablet Take 1 tablet (80 mg total) by mouth daily at 6 PM. 90 tablet 1  . ezetimibe (ZETIA) 10 MG tablet Take 1 tablet (10 mg total) by mouth daily. 90 tablet 1  . gabapentin (NEURONTIN) 300 MG capsule Take 1 capsule (300 mg total) by mouth 2 (two) times daily. 180 capsule 0  . insulin glargine (LANTUS SOLOSTAR) 100  UNIT/ML Solostar Pen Inject 36 Units into the skin at bedtime. 5 pen 2  . Insulin Pen Needle 32G X 4 MM MISC 1 Device by Does not apply route daily. 50 each 2  . levETIRAcetam (KEPPRA XR) 500 MG 24 hr tablet Take 1 tablet (500 mg total) by mouth daily. 90 each 3  . levothyroxine (SYNTHROID) 50 MCG tablet Take 1 tablet (50 mcg total) by mouth daily. 90 tablet 1  . lidocaine-prilocaine (EMLA) cream Apply 1 application topically daily as needed (prior to port being accessed (~every 3 months)).    . metoprolol tartrate (LOPRESSOR) 25 MG tablet Take 1 tablet (25 mg total) by mouth 2 (two) times daily. 180 tablet 3  . NOVOLOG FLEXPEN 100 UNIT/ML FlexPen INJECT 6 UNITS INTO THE SKIN 3 (THREE) TIMES DAILY WITH MEALS. 15 mL 0  . Omega-3 1000 MG CAPS Take 1,000 mg  by mouth daily.    . pantoprazole (PROTONIX) 20 MG tablet TAKE 1 TABLET EVERY DAY 90 tablet 1  . sertraline (ZOLOFT) 50 MG tablet TAKE 1 TABLET BY MOUTH DAILY (Patient taking differently: Take 50 mg by mouth daily. ) 90 tablet 1  . tamsulosin (FLOMAX) 0.4 MG CAPS capsule Take 1 capsule (0.4 mg total) by mouth daily. 90 capsule 3  . vitamin B-12 (CYANOCOBALAMIN) 1000 MCG tablet Take 1,000 mcg by mouth daily.    . clopidogrel (PLAVIX) 75 MG tablet Take 75 mg by mouth daily.     Facility-Administered Medications Prior to Visit  Medication Dose Route Frequency Provider Last Rate Last Admin  . 0.9 %  sodium chloride infusion   Intravenous Continuous Derek Jack, MD 20 mL/hr at 12/30/18 1351 New Bag at 12/30/18 1351  . 0.9 %  sodium chloride infusion   Intravenous Continuous Derek Jack, MD 20 mL/hr at 12/30/18 1401 New Bag at 12/30/18 1401  . dextrose 5 % solution   Intravenous Once Derek Jack, MD      . heparin lock flush 100 unit/mL  500 Units Intracatheter Once PRN Derek Jack, MD      . sodium chloride flush (NS) 0.9 % injection 10 mL  10 mL Intracatheter PRN Derek Jack, MD   10 mL at 12/30/18 0911   . sodium chloride flush (NS) 0.9 % injection 10 mL  10 mL Intracatheter PRN Derek Jack, MD   10 mL at 05/21/19 0845     Allergies:   Demerol [meperidine hcl]   Social History   Socioeconomic History  . Marital status: Single    Spouse name: Not on file  . Number of children: 2  . Years of education: Not on file  . Highest education level: Not on file  Occupational History  . Occupation: Estate agent  Tobacco Use  . Smoking status: Former Smoker    Start date: 10/06/2019  . Smokeless tobacco: Never Used  Vaping Use  . Vaping Use: Never used  Substance and Sexual Activity  . Alcohol use: Yes    Alcohol/week: 1.0 standard drink    Types: 1 Cans of beer per week    Comment: rare   . Drug use: Never  . Sexual activity: Not on file  Other Topics Concern  . Not on file  Social History Narrative  . Not on file   Social Determinants of Health   Financial Resource Strain:   . Difficulty of Paying Living Expenses: Not on file  Food Insecurity:   . Worried About Charity fundraiser in the Last Year: Not on file  . Ran Out of Food in the Last Year: Not on file  Transportation Needs:   . Lack of Transportation (Medical): Not on file  . Lack of Transportation (Non-Medical): Not on file  Physical Activity:   . Days of Exercise per Week: Not on file  . Minutes of Exercise per Session: Not on file  Stress:   . Feeling of Stress : Not on file  Social Connections:   . Frequency of Communication with Friends and Family: Not on file  . Frequency of Social Gatherings with Friends and Family: Not on file  . Attends Religious Services: Not on file  . Active Member of Clubs or Organizations: Not on file  . Attends Archivist Meetings: Not on file  . Marital Status: Not on file     Family History:  The patient's family history includes Heart Problems in  his paternal grandfather; Heart attack in his maternal grandfather; Muscular dystrophy in his grandson;  Pancreatic cancer (age of onset: 35) in his father; Scoliosis in his daughter; Stroke in his maternal grandmother and mother.   Review of Systems:   Please see the history of present illness.     General:  No chills, fever, night sweats or weight changes.  Cardiovascular:  No chest pain, edema, orthopnea, palpitations, paroxysmal nocturnal dyspnea. Positive for dyspnea on exertion.  Dermatological: No rash, lesions/masses Respiratory: No cough, dyspnea Urologic: No hematuria, dysuria Abdominal:   No nausea, vomiting, diarrhea, bright red blood per rectum, melena, or hematemesis Neurologic:  No visual changes, wkns, changes in mental status. All other systems reviewed and are otherwise negative except as noted above.   Physical Exam:    VS:  BP 132/60   Pulse 88   Ht 5\' 10"  (1.778 m)   Wt 159 lb 3.2 oz (72.2 kg)   SpO2 96%   BMI 22.84 kg/m    General: Well developed, well nourished,male appearing in no acute distress. Head: Normocephalic, atraumatic. Neck: Left  carotid bruit. JVD not elevated.  Lungs: Respirations regular and unlabored, without wheezes or rales.  Heart: Regular rate and rhythm. No S3 or S4.  No murmur, no rubs, or gallops appreciated. Abdomen: Appears non-distended. No obvious abdominal masses. Msk:  Strength and tone appear normal for age. No obvious joint deformities or effusions. Extremities: No clubbing or cyanosis. No lower extremity edema.  Distal pedal pulses are 2+ bilaterally. Neuro: Alert and oriented X 3. Moves all extremities spontaneously. No focal deficits noted. Psych:  Responds to questions appropriately with a normal affect. Skin: No rashes or lesions noted  Wt Readings from Last 3 Encounters:  05/18/20 159 lb 3.2 oz (72.2 kg)  05/18/20 160 lb 15 oz (73 kg)  05/17/20 161 lb (73 kg)     Studies/Labs Reviewed:   EKG:  EKG is not ordered during today's visit. today. Tracing from the ED reviewed which shows NSR, HR 90 with no acute ST  abnormalities when compared to prior tracings.   Recent Labs: 01/27/2020: Magnesium 2.2 05/17/2020: TSH 0.33 05/18/2020: ALT 16; BUN 14; Creatinine, Ser 0.84; Hemoglobin 13.7; Platelets 216; Potassium 3.8; Sodium 137   Lipid Panel    Component Value Date/Time   CHOL 111 05/17/2020 1329   TRIG 90 05/17/2020 1329   HDL 50 05/17/2020 1329   CHOLHDL 2.2 05/17/2020 1329   VLDL 15 01/06/2020 1008   LDLCALC 44 05/17/2020 1329    Additional studies/ records that were reviewed today include:   Cardiac Catheterization: 01/2020  Ost LAD to Prox LAD lesion is 75% stenosed. Prox LAD to Mid LAD lesion is 55% stenosed -this segment was considered significant by CT FFR  Mid LAD to Dist LAD lesion is 40% stenosed.  Dist (apical) LAD lesion is 75% stenosed.  Prox Cx to Dist Cx lesion is 65% stenosed. Long irregular lesion-significant by CT FFR  Prox RCA to Mid RCA lesion is 75% stenosed. Mid RCA lesion is 80% stenosed -lesions in tandem significant by CT FFR  Dist RCA-1 lesion is 55% stenosed.  Dist RCA-2 lesion is 80% stenosed with 80% stenosed side branch in RPDA. Significant by CT FFR  LV end diastolic pressure is mildly elevated.  There is no aortic valve stenosis.    Severe multivessel CAD involving the proximal third of the LAD, mid LCx and mid-distal RCA confirming findings seen on coronary CTA -> recommend CABG  Mildly elevated  LVEDP   We will discharge home after bedrest.  He has outpatient CVTS consultation scheduled. Will need continued aggressive cardiovascular risk factor modification with guideline directed medical therapy.  TEE: 01/2020 POST-OP IMPRESSIONS  Overall, there were no significant changes from pre-bypass.   PRE-OP FINDINGS  Left Ventricle: The left ventricle has hyperdynamic systolic function,  with an ejection fraction of >65% 70%. The cavity size was normal. There  is no increase in left ventricular wall thickness. No evidence of left  ventricular  regional wall motion  abnormalities.     Assessment:    1. Coronary artery disease involving native coronary artery of native heart without angina pectoris   2. Acute deep vein thrombosis (DVT) of left upper extremity, unspecified vein (HCC)   3. Essential hypertension   4. Hyperlipidemia LDL goal <70   5. Need for immunization against influenza      Plan:   In order of problems listed above:  1. CAD - He is s/p CABG in 01/2020 with LIMA-LAD, SVG-OM, SVG-PDA and SVG-D1. He continues to participate in cardiac rehab and reports improvement in his stamina. No recent chest pain. - Continue ASA 81 mg daily, Atorvastatin 80 mg daily, Zetia 10 mg daily and Lopressor 25 mg twice daily. He is no longer on Plavix given the need for anticoagulation.  2. DVT of LUE  - Diagnosed in 01/2020. By review of notes from Dr. Delton Coombes, it was recommended he continue Eliquis at the time of his visit in 04/2020 as his DVT was likely port-induced and he wished to wait until his next visit to discuss discontinuation of his port. Will send a staff message today to verify with Oncology in regards to duration of anticoagulation.   ADDENDUM: Confirmed with Oncology he needs to remain on Eliquis until his port is removed.   3. HTN - BP is well-controlled at 132/60 during today's visit. He reports his SBP is sometimes in the 90's to low-100's when checked at home or at cardiac rehab but he is asymptomatic with this and denies any associated dizziness. Continue Lopressor 25mg  BID. This can be reduced if his BP is consistently soft or if he develops any associated symptoms.   4. HLD - Followed by his PCP. LDL was at 44 when checked yesterday. At goal of less than 70. Continue Atorvastatin 80mg  daily and Zetia 10mg  daily.   5. Need for Influenza Vaccine - Flu vaccination administered by nursing staff during today's visit.    Medication Adjustments/Labs and Tests Ordered: Current medicines are reviewed at  length with the patient today.  Concerns regarding medicines are outlined above.  Medication changes, Labs and Tests ordered today are listed in the Patient Instructions below. Patient Instructions  Medication Instructions:   No changes.   Labwork:  None  Testing/Procedures:  None  Follow-Up:  With Dr. Domenic Polite in 6 months.   Any Other Special Instructions Will Be Listed Below (If Applicable).   If you need a refill on your cardiac medications before your next appointment, please call your pharmacy.    Signed, Erma Heritage, PA-C  05/18/2020 5:38 PM    [facility] [address] [place], [place] [address] Phone: [phone] Fax: [phone]

## 2020-05-18 NOTE — Progress Notes (Signed)
Please call this patient.  Tell him that his diabetes is extremely uncontrolled with a hemoglobin A1c of 11.3% and his blood glucose is extremely high at 551.  His potassium is also elevated.I would recommend that he goes to the emergency room to be evaluated immediately.

## 2020-05-18 NOTE — Progress Notes (Signed)
Pt is now at [facility] ER as directed.

## 2020-05-19 ENCOUNTER — Encounter (HOSPITAL_COMMUNITY)
Admission: RE | Admit: 2020-05-19 | Discharge: 2020-05-19 | Disposition: A | Discharge status: Home / Self Care | Payer: Medicare HMO | Source: Ambulatory Visit | Attending: Internal Medicine | Admitting: Internal Medicine

## 2020-05-19 DIAGNOSIS — Z951 Presence of aortocoronary bypass graft: Secondary | ICD-10-CM

## 2020-05-19 NOTE — Progress Notes (Signed)
Daily Session Note  Patient Details  Name: Mark Foster MRN: [id number] Date of Birth: 1956-06-27 Referring Provider:     CARDIAC REHAB PHASE II EXERCISE from 04/16/2020 in [place]  Referring Provider Dr. Anastasio Foster      Encounter Date: 05/19/2020  Check In:  Session Check In - 05/19/20 1100      Check-In   Supervising physician immediately available to respond to emergencies See telemetry face sheet for immediately available MD    Location [facility]    Staff Present Mark Register, MS, ACSM-CEP, Exercise Physiologist;Mark Lizak Wynetta Emery, RN, BSN    Virtual Visit No    Medication changes reported     No    Fall or balance concerns reported    No    Tobacco Cessation No Change    Warm-up and Cool-down Performed as group-led instruction    Resistance Training Performed Yes    VAD Patient? No    PAD/SET Patient? No      Pain Assessment   Currently in Pain? No/denies    Pain Score 0-No pain    Multiple Pain Sites No           Capillary Blood Glucose: No results found for this or any previous visit (from the past 24 hour(s)).    Social History   Tobacco Use  Smoking Status Former Smoker  . Start date: 10/06/2019  Smokeless Tobacco Never Used    Goals Met:  Independence with exercise equipment Exercise tolerated well No report of cardiac concerns or symptoms Strength training completed today  Goals Unmet:  Not Applicable  Comments: Check out 1200.   Dr. Kathie Foster is Medical Director for [facility] Pulmonary Rehab.

## 2020-05-20 NOTE — Telephone Encounter (Signed)
Pt went to ER on 05/18/20.per [facility] verbal order.

## 2020-05-21 ENCOUNTER — Encounter (HOSPITAL_COMMUNITY)
Admission: RE | Admit: 2020-05-21 | Discharge: 2020-05-21 | Disposition: A | Discharge status: Home / Self Care | Payer: Medicare HMO | Source: Ambulatory Visit | Attending: Internal Medicine | Admitting: Internal Medicine

## 2020-05-21 ENCOUNTER — Other Ambulatory Visit: Payer: Self-pay

## 2020-05-21 DIAGNOSIS — Z951 Presence of aortocoronary bypass graft: Secondary | ICD-10-CM | POA: Diagnosis not present

## 2020-05-21 NOTE — Progress Notes (Signed)
Daily Session Note  Patient Details  Name: Mark Foster MRN: [id number] Date of Birth: [birth year] Referring Provider:     CARDIAC REHAB PHASE II EXERCISE from 04/16/2020 in [place]  Referring Provider [name]      Encounter Date: 05/21/2020  Check In:  Session Check In - 05/21/20 1108      Check-In   Supervising physician immediately available to respond to emergencies See telemetry face sheet for immediately available MD    Location [facility]    Staff Present Geanie Cooley, RN;Debra Wynetta Emery, RN, BSN;Ramon Dredge, RN, MHA    Virtual Visit No    Medication changes reported     No    Fall or balance concerns reported    No    Tobacco Cessation No Change    Warm-up and Cool-down Performed as group-led instruction    Resistance Training Performed Yes    VAD Patient? No    PAD/SET Patient? No      Pain Assessment   Currently in Pain? No/denies    Pain Score 0-No pain    Multiple Pain Sites No           Capillary Blood Glucose: No results found for this or any previous visit (from the past 24 hour(s)).    Social History   Tobacco Use  Smoking Status Former Smoker  . Start date: 10/06/2019  Smokeless Tobacco Never Used    Goals Met:  Independence with exercise equipment Exercise tolerated well Personal goals reviewed No report of cardiac concerns or symptoms Strength training completed today  Goals Unmet:  Not Applicable  Comments: check out 12:00   Dr. [name] is Medical Director for [facility] Pulmonary Rehab.

## 2020-05-24 ENCOUNTER — Encounter (HOSPITAL_COMMUNITY)
Admission: RE | Admit: 2020-05-24 | Discharge: 2020-05-24 | Disposition: A | Discharge status: Home / Self Care | Payer: Medicare HMO | Source: Ambulatory Visit | Attending: Internal Medicine | Admitting: Internal Medicine

## 2020-05-24 ENCOUNTER — Other Ambulatory Visit: Payer: Self-pay

## 2020-05-24 DIAGNOSIS — Z951 Presence of aortocoronary bypass graft: Secondary | ICD-10-CM | POA: Diagnosis not present

## 2020-05-24 NOTE — Progress Notes (Signed)
Daily Session Note  Patient Details  Name: CARDEN TEEL MRN: [id number] Date of Birth: Aug 12, 1956 Referring Provider:     CARDIAC REHAB PHASE II EXERCISE from 04/16/2020 in [place]  Referring Provider [name]      Encounter Date: 05/24/2020  Check In:  Session Check In - 05/24/20 1058      Check-In   Supervising physician immediately available to respond to emergencies See telemetry face sheet for immediately available MD    Location [facility]    Staff Present Aundra Dubin, RN, Bjorn Loser, MS, ACSM-CEP, Exercise Physiologist    Virtual Visit No    Medication changes reported     No    Fall or balance concerns reported    No    Tobacco Cessation No Change    Warm-up and Cool-down Performed as group-led instruction    Resistance Training Performed Yes    PAD/SET Patient? No      Pain Assessment   Currently in Pain? No/denies    Pain Score 0-No pain    Multiple Pain Sites No           Capillary Blood Glucose: No results found for this or any previous visit (from the past 24 hour(s)).    Social History   Tobacco Use  Smoking Status Former Smoker  . Start date: 10/06/2019  Smokeless Tobacco Never Used    Goals Met:  Independence with exercise equipment Exercise tolerated well No report of cardiac concerns or symptoms Strength training completed today  Goals Unmet:  Not Applicable  Comments: Check out 1200.   Dr. [name] is Medical Director for [facility] Pulmonary Rehab.

## 2020-05-26 ENCOUNTER — Other Ambulatory Visit: Payer: Self-pay

## 2020-05-26 ENCOUNTER — Encounter (HOSPITAL_COMMUNITY)
Admission: RE | Admit: 2020-05-26 | Discharge: 2020-05-26 | Disposition: A | Discharge status: Home / Self Care | Payer: Medicare HMO | Source: Ambulatory Visit | Attending: Internal Medicine | Admitting: Internal Medicine

## 2020-05-26 VITALS — Wt 160.7 lb

## 2020-05-26 DIAGNOSIS — Z951 Presence of aortocoronary bypass graft: Secondary | ICD-10-CM | POA: Diagnosis not present

## 2020-05-26 NOTE — Progress Notes (Signed)
Daily Session Note  Patient Details  Name: Mark Foster MRN: [id number] Date of Birth: 1956/06/09 Referring Provider:     CARDIAC REHAB PHASE II EXERCISE from 04/16/2020 in [place]  Referring Provider [name]      Encounter Date: 05/26/2020  Check In:  Session Check In - 05/26/20 1100      Check-In   Supervising physician immediately available to respond to emergencies See telemetry face sheet for immediately available MD    Location [facility]    Staff Present Aundra Dubin, RN, Bjorn Loser, MS, ACSM-CEP, Exercise Physiologist    Virtual Visit No    Medication changes reported     No    Fall or balance concerns reported    No    Tobacco Cessation No Change    Warm-up and Cool-down Performed as group-led instruction    Resistance Training Performed Yes    VAD Patient? No    PAD/SET Patient? No      Pain Assessment   Currently in Pain? No/denies    Pain Score 0-No pain    Multiple Pain Sites No           Capillary Blood Glucose: No results found for this or any previous visit (from the past 24 hour(s)).    Social History   Tobacco Use  Smoking Status Former Smoker  . Start date: 10/06/2019  Smokeless Tobacco Never Used    Goals Met:  Independence with exercise equipment Exercise tolerated well No report of cardiac concerns or symptoms Strength training completed today  Goals Unmet:  Not Applicable  Comments: Check out 1200.   Dr. [name] is Medical Director for [facility] Pulmonary Rehab.

## 2020-05-28 ENCOUNTER — Other Ambulatory Visit: Payer: Self-pay

## 2020-05-28 ENCOUNTER — Encounter (HOSPITAL_COMMUNITY): Payer: Medicare HMO

## 2020-05-28 ENCOUNTER — Encounter (HOSPITAL_COMMUNITY)
Admission: RE | Admit: 2020-05-28 | Discharge: 2020-05-28 | Disposition: A | Discharge status: Home / Self Care | Payer: Medicare HMO | Source: Ambulatory Visit | Attending: Internal Medicine | Admitting: Internal Medicine

## 2020-05-28 DIAGNOSIS — Z951 Presence of aortocoronary bypass graft: Secondary | ICD-10-CM | POA: Diagnosis not present

## 2020-05-28 NOTE — Progress Notes (Signed)
Daily Session Note  Patient Details  Name: Mark Foster MRN: [id number] Date of Birth: 1955-12-08 Referring Provider:     CARDIAC REHAB PHASE II EXERCISE from 04/16/2020 in [place]  Referring Provider [name]      Encounter Date: 05/28/2020  Check In:  Session Check In - 05/28/20 1108      Check-In   Staff Present Ramon Dredge, RN, MHA;Debra Wynetta Emery, RN, BSN    Virtual Visit No    Medication changes reported     No    Fall or balance concerns reported    No    Tobacco Cessation No Change    Warm-up and Cool-down Performed as group-led instruction    Resistance Training Performed Yes    VAD Patient? No    PAD/SET Patient? No      Pain Assessment   Currently in Pain? No/denies           Capillary Blood Glucose: No results found for this or any previous visit (from the past 24 hour(s)).    Social History   Tobacco Use  Smoking Status Former Smoker  . Start date: 10/06/2019  Smokeless Tobacco Never Used    Goals Met:  Independence with exercise equipment Exercise tolerated well No report of cardiac concerns or symptoms Strength training completed today  Goals Unmet:  Not Applicable  Comments: 0051-1021   Dr. [name] is Medical Director for [facility] Pulmonary Rehab.

## 2020-05-31 ENCOUNTER — Other Ambulatory Visit: Payer: Self-pay

## 2020-05-31 ENCOUNTER — Encounter (HOSPITAL_COMMUNITY)
Admission: RE | Admit: 2020-05-31 | Discharge: 2020-05-31 | Disposition: A | Discharge status: Home / Self Care | Payer: Medicare HMO | Source: Ambulatory Visit | Attending: Internal Medicine | Admitting: Internal Medicine

## 2020-05-31 DIAGNOSIS — Z951 Presence of aortocoronary bypass graft: Secondary | ICD-10-CM | POA: Diagnosis not present

## 2020-05-31 NOTE — Progress Notes (Signed)
Daily Session Note  Patient Details  Name: Mark Foster MRN: [id number] Date of Birth: 01/11/1956 Referring Provider:     CARDIAC REHAB PHASE II EXERCISE from 04/16/2020 in [facility]  Referring Provider Dr. Gosrani      Encounter Date: 05/31/2020  Check In:  Session Check In - 05/31/20 1100      Check-In   Supervising physician immediately available to respond to emergencies See telemetry face sheet for immediately available MD    Location [facility]    Staff Present Debra Johnson, RN, BSN;Dalton Fletcher, MS, ACSM-CEP, Exercise Physiologist    Virtual Visit No    Medication changes reported     No    Fall or balance concerns reported    No    Tobacco Cessation No Change    Warm-up and Cool-down Performed as group-led instruction    Resistance Training Performed Yes    VAD Patient? No    PAD/SET Patient? No      PAD/SET Patient   Completed foot check today? No    Open wounds to report? No      Pain Assessment   Currently in Pain? No/denies    Pain Score 0-No pain    Multiple Pain Sites No           Capillary Blood Glucose: No results found for this or any previous visit (from the past 24 hour(s)).    Social History   Tobacco Use  Smoking Status Former Smoker  . Start date: 10/06/2019  Smokeless Tobacco Never Used    Goals Met:  Independence with exercise equipment Exercise tolerated well No report of cardiac concerns or symptoms Strength training completed today  Goals Unmet:  Not Applicable  Comments: Check out 1200.   Dr. Jehanzeb Memon is Medical Director for [facility]. 

## 2020-06-02 ENCOUNTER — Other Ambulatory Visit: Payer: Self-pay

## 2020-06-02 ENCOUNTER — Encounter (HOSPITAL_COMMUNITY)
Admission: RE | Admit: 2020-06-02 | Discharge: 2020-06-02 | Disposition: A | Discharge status: Home / Self Care | Payer: Medicare HMO | Source: Ambulatory Visit | Attending: Internal Medicine | Admitting: Internal Medicine

## 2020-06-02 DIAGNOSIS — Z951 Presence of aortocoronary bypass graft: Secondary | ICD-10-CM

## 2020-06-02 NOTE — Progress Notes (Signed)
Cardiac Individual Treatment Plan  Patient Details  Name: Mark Foster MRN: [id number] Date of Birth: [birth year] Referring Provider:     CARDIAC REHAB PHASE II EXERCISE from 04/16/2020 in [place]  Referring Provider [name]      Initial Encounter Date:    CARDIAC REHAB PHASE II EXERCISE from 04/16/2020 in [place]  Date 04/16/20      Visit Diagnosis: S/P CABG x 4  Patient's Home Medications on Admission:  Current Outpatient Medications:  .  apixaban (ELIQUIS) 5 MG TABS tablet, Take 1 tablet (5 mg total) by mouth daily., Disp: 30 tablet, Rfl: 3 .  aspirin EC 81 MG EC tablet, Take 1 tablet (81 mg total) by mouth daily., Disp: , Rfl:  .  atorvastatin (LIPITOR) 80 MG tablet, Take 1 tablet (80 mg total) by mouth daily at 6 PM., Disp: 90 tablet, Rfl: 1 .  ezetimibe (ZETIA) 10 MG tablet, Take 1 tablet (10 mg total) by mouth daily., Disp: 90 tablet, Rfl: 1 .  gabapentin (NEURONTIN) 300 MG capsule, Take 1 capsule (300 mg total) by mouth 2 (two) times daily., Disp: 180 capsule, Rfl: 0 .  insulin glargine (LANTUS SOLOSTAR) 100 UNIT/ML Solostar Pen, Inject 36 Units into the skin at bedtime., Disp: 5 pen, Rfl: 2 .  Insulin Pen Needle 32G X 4 MM MISC, 1 Device by Does not apply route daily., Disp: 50 each, Rfl: 2 .  levETIRAcetam (KEPPRA XR) 500 MG 24 hr tablet, Take 1 tablet (500 mg total) by mouth daily., Disp: 90 each, Rfl: 3 .  levothyroxine (SYNTHROID) 50 MCG tablet, Take 1 tablet (50 mcg total) by mouth daily., Disp: 90 tablet, Rfl: 1 .  lidocaine-prilocaine (EMLA) cream, Apply 1 application topically daily as needed (prior to port being accessed (~every 3 months))., Disp: , Rfl:  .  metoprolol tartrate (LOPRESSOR) 25 MG tablet, Take 1 tablet (25 mg total) by mouth 2 (two) times daily., Disp: 180 tablet, Rfl: 3 .  NOVOLOG FLEXPEN 100 UNIT/ML FlexPen, INJECT 6 UNITS INTO THE SKIN 3 (THREE) TIMES DAILY WITH MEALS., Disp: 15 mL, Rfl: 0 .   Omega-3 1000 MG CAPS, Take 1,000 mg by mouth daily., Disp: , Rfl:  .  pantoprazole (PROTONIX) 20 MG tablet, TAKE 1 TABLET EVERY DAY, Disp: 90 tablet, Rfl: 1 .  sertraline (ZOLOFT) 50 MG tablet, TAKE 1 TABLET BY MOUTH DAILY (Patient taking differently: Take 50 mg by mouth daily. ), Disp: 90 tablet, Rfl: 1 .  tamsulosin (FLOMAX) 0.4 MG CAPS capsule, Take 1 capsule (0.4 mg total) by mouth daily., Disp: 90 capsule, Rfl: 3 .  vitamin B-12 (CYANOCOBALAMIN) 1000 MCG tablet, Take 1,000 mcg by mouth daily., Disp: , Rfl:  No current facility-administered medications for this encounter.  Facility-Administered Medications Ordered in Other Encounters:  .  0.9 %  sodium chloride infusion, , Intravenous, Continuous, Derek Jack, MD, Last Rate: 20 mL/hr at 12/30/18 1351, New Bag at 12/30/18 1351 .  0.9 %  sodium chloride infusion, , Intravenous, Continuous, Derek Jack, MD, Last Rate: 20 mL/hr at 12/30/18 1401, New Bag at 12/30/18 1401 .  dextrose 5 % solution, , Intravenous, Once, Derek Jack, MD .  heparin lock flush 100 unit/mL, 500 Units, Intracatheter, Once PRN, Derek Jack, MD .  sodium chloride flush (NS) 0.9 % injection 10 mL, 10 mL, Intracatheter, PRN, Derek Jack, MD, 10 mL at 12/30/18 0911 .  sodium chloride flush (NS) 0.9 % injection 10 mL, 10 mL, Intracatheter, PRN, Delton Coombes,  [name], MD, 10 mL at 05/21/19 0845  Past Medical History: Past Medical History:  Diagnosis Date  . CAD (coronary artery disease)    a. 01/2020: CABG x4 with LIMA-LAD, SVG-OM, SVG-PDA and SVG-D1  . Carotid artery obstruction, left    Left ICA occlusion  . Cervical compression fracture ([place])   . Cervical spinal stenosis   . Coronary artery disease   . Diabetic neuropathy (HCC)    Bilateral legs  . Diverticulitis   . Family history of pancreatic cancer   . History of kidney stones   . Hypothyroidism   . Pancreatic cancer ([place])   . Stenosis of left vertebral artery   .  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 Stroke [facility])    2011  . Type 2 diabetes mellitus (HCC)     Tobacco Use: Social History   Tobacco Use  Smoking Status Former Smoker  . Start date: 10/06/2019  Smokeless Tobacco Never Used    Labs: Recent Review Flowsheet Data    Labs for ITP Cardiac and Pulmonary Rehab Latest Ref Rng & Units 01/26/2020 01/26/2020 01/26/2020 01/26/2020 05/17/2020   Cholestrol <200 mg/dL - - - - 111   LDLCALC mg/dL (calc) - - - - 44   HDL > OR = 40 mg/dL - - - - 50   Trlycerides <150 mg/dL - - - - 90   Hemoglobin A1c <5.7 % of total Hgb - - - - 11.3(H)   PHART 7.35 - 7.45 7.398 7.280(L) 7.310(L) 7.314(L) -   PCO2ART 32 - 48 mmHg 41.9 51.4(H) 47.0 45.9 -   HCO3 20.0 - 28.0 mmol/L 26.0 24.0 23.4 23.1 -   TCO2 22 - 32 mmol/L 27 25 25 24  -   ACIDBASEDEF 0.0 - 2.0 mmol/L - 3.0(H) 3.0(H) 3.0(H) -   O2SAT % 99.0 98.0 98.0 96.0 -      Capillary Blood Glucose: Lab Results  Component Value Date   GLUCAP 230 (H) 05/18/2020   GLUCAP 152 (H) 01/31/2020   GLUCAP 97 01/31/2020   GLUCAP 89 01/30/2020   GLUCAP 186 (H) 01/30/2020     Exercise Target Goals: Exercise Program Goal: Individual exercise prescription set using results from initial 6 min walk test and THRR while considering  patient's activity barriers and safety.   Exercise Prescription Goal: Starting with aerobic activity 30 plus minutes a day, 3 days per week for initial exercise prescription. Provide home exercise prescription and guidelines that participant acknowledges understanding prior to discharge.  Activity Barriers & Risk Stratification:   6 Minute Walk:  6 Minute Walk    Row Name 04/16/20 1109         6 Minute Walk   Phase Initial     Distance 1300 feet     Walk Time 6 minutes     # of Rest Breaks 0     MPH 2.46     METS 3.45     RPE 12.07     VO2 Peak 11     Symptoms No     Resting HR 92 bpm     Resting BP 100/60     Resting Oxygen Saturation  98 %     Exercise Oxygen Saturation   during 6 min walk 100 %     Max Ex. HR 98 bpm     Max Ex. BP 108/62     2 Minute Post BP 98/64            Oxygen Initial Assessment:   Oxygen  Re-Evaluation:   Oxygen Discharge (Final Oxygen Re-Evaluation):   Initial Exercise Prescription:  Initial Exercise Prescription - 04/16/20 1100      Date of Initial Exercise RX and Referring Provider   Date 04/16/20    Referring Provider [name]    Expected Discharge Date 07/17/20      Treadmill   MPH 1.7    Grade 0    Minutes 17    METs 2.3      NuStep   Level 2    SPM 80    Minutes 22    METs 2.5      Prescription Details   Frequency (times per week) 3    Duration Progress to 30 minutes of continuous aerobic without signs/symptoms of physical distress      Intensity   THRR 40-80% of Max Heartrate 62-124    Ratings of Perceived Exertion 11-13    Perceived Dyspnea 0-4      Progression   Progression Continue progressive overload as per policy without signs/symptoms or physical distress.      Resistance Training   Training Prescription Yes    Weight 3    Reps 10-15           Perform Capillary Blood Glucose checks as needed.  Exercise Prescription Changes:   Exercise Prescription Changes    Row Name 04/26/20 1200 05/03/20 1100 05/12/20 1300 05/26/20 1200       Response to Exercise   Blood Pressure (Admit) 116/64 -- 110/60 102/56    Blood Pressure (Exercise) 140/64 -- 140/62 90/60    Blood Pressure (Exit) 102/64 -- 92/60 88/50    Heart Rate (Admit) 92 bpm -- 90 bpm 70 bpm    Heart Rate (Exercise) 98 bpm -- 142 bpm 105 bpm    Heart Rate (Exit) 109 bpm -- 101 bpm 80 bpm    Rating of Perceived Exertion (Exercise) 10 -- 10 11    Duration Continue with 30 min of aerobic exercise without signs/symptoms of physical distress. -- Continue with 30 min of aerobic exercise without signs/symptoms of physical distress. Continue with 30 min of aerobic exercise without signs/symptoms of physical distress.     Intensity THRR unchanged -- THRR unchanged THRR unchanged      Progression   Progression Continue to progress workloads to maintain intensity without signs/symptoms of physical distress. -- Continue to progress workloads to maintain intensity without signs/symptoms of physical distress. Continue to progress workloads to maintain intensity without signs/symptoms of physical distress.      Resistance Training   Training Prescription Yes -- Yes Yes    Weight 3 -- 3 lbs 3 lbs    Reps 10-15 -- 10-15 10-15      Treadmill   MPH 1.9 -- 2.3 2.6    Grade 0 -- 0 1    Minutes 17 -- 17 17    METs 2.45 -- 2.76 3.41      NuStep   Level 2 -- 3 3    SPM 121 -- 159 164    Minutes 22 -- 22 22    METs 2.9 -- 2.2 2.3      Home Exercise Plan   Plans to continue exercise at -- [employer] (comment) -- --    Frequency -- Add 2 additional days to program exercise sessions. -- --    Initial Home Exercises Provided -- 05/03/20 -- --           Exercise Comments:   Exercise Comments  [place] Name 05/03/20 1126           Exercise Comments home exercise reviewed              Exercise Goals and Review:   Exercise Goals    Row Name 04/16/20 1114 05/03/20 1154 05/31/20 1319         Exercise Goals   Increase Physical Activity Yes Yes Yes     Intervention Provide advice, education, support and counseling about physical activity/exercise needs.;Develop an individualized exercise prescription for aerobic and resistive training based on initial evaluation findings, risk stratification, comorbidities and participant's personal goals. Provide advice, education, support and counseling about physical activity/exercise needs.;Develop an individualized exercise prescription for aerobic and resistive training based on initial evaluation findings, risk stratification, comorbidities and participant's personal goals. Provide advice, education, support and counseling about physical activity/exercise  needs.;Develop an individualized exercise prescription for aerobic and resistive training based on initial evaluation findings, risk stratification, comorbidities and participant's personal goals.     Expected Outcomes Short Term: Attend rehab on a regular basis to increase amount of physical activity.;Long Term: Add in home exercise to make exercise part of routine and to increase amount of physical activity.;Long Term: Exercising regularly at least 3-5 days a week. Short Term: Attend rehab on a regular basis to increase amount of physical activity.;Long Term: Add in home exercise to make exercise part of routine and to increase amount of physical activity.;Long Term: Exercising regularly at least 3-5 days a week. Short Term: Attend rehab on a regular basis to increase amount of physical activity.;Long Term: Add in home exercise to make exercise part of routine and to increase amount of physical activity.;Long Term: Exercising regularly at least 3-5 days a week.     Increase Strength and Stamina Yes Yes Yes     Intervention Provide advice, education, support and counseling about physical activity/exercise needs.;Develop an individualized exercise prescription for aerobic and resistive training based on initial evaluation findings, risk stratification, comorbidities and participant's personal goals. Provide advice, education, support and counseling about physical activity/exercise needs.;Develop an individualized exercise prescription for aerobic and resistive training based on initial evaluation findings, risk stratification, comorbidities and participant's personal goals. Provide advice, education, support and counseling about physical activity/exercise needs.;Develop an individualized exercise prescription for aerobic and resistive training based on initial evaluation findings, risk stratification, comorbidities and participant's personal goals.     Expected Outcomes Short Term: Increase workloads from  initial exercise prescription for resistance, speed, and METs.;Short Term: Perform resistance training exercises routinely during rehab and add in resistance training at home;Long Term: Improve cardiorespiratory fitness, muscular endurance and strength as measured by increased METs and functional capacity (6MWT) Short Term: Increase workloads from initial exercise prescription for resistance, speed, and METs.;Short Term: Perform resistance training exercises routinely during rehab and add in resistance training at home;Long Term: Improve cardiorespiratory fitness, muscular endurance and strength as measured by increased METs and functional capacity (6MWT) Short Term: Increase workloads from initial exercise prescription for resistance, speed, and METs.;Short Term: Perform resistance training exercises routinely during rehab and add in resistance training at home;Long Term: Improve cardiorespiratory fitness, muscular endurance and strength as measured by increased METs and functional capacity (6MWT)     Able to understand and use rate of perceived exertion (RPE) scale Yes Yes Yes     Intervention Provide education and explanation on how to use RPE scale Provide education and explanation on how to use RPE scale Provide education and explanation on how to use RPE  scale     Expected Outcomes Short Term: Able to use RPE daily in rehab to express subjective intensity level;Long Term:  Able to use RPE to guide intensity level when exercising independently Short Term: Able to use RPE daily in rehab to express subjective intensity level;Long Term:  Able to use RPE to guide intensity level when exercising independently Short Term: Able to use RPE daily in rehab to express subjective intensity level;Long Term:  Able to use RPE to guide intensity level when exercising independently     Knowledge and understanding of Target Heart Rate Range (THRR) Yes Yes Yes     Intervention Provide education and explanation of THRR  including how the numbers were predicted and where they are located for reference Provide education and explanation of THRR including how the numbers were predicted and where they are located for reference Provide education and explanation of THRR including how the numbers were predicted and where they are located for reference     Expected Outcomes Short Term: Able to state/look up THRR;Short Term: Able to use daily as guideline for intensity in rehab;Long Term: Able to use THRR to govern intensity when exercising independently Short Term: Able to state/look up THRR;Short Term: Able to use daily as guideline for intensity in rehab;Long Term: Able to use THRR to govern intensity when exercising independently Short Term: Able to state/look up THRR;Short Term: Able to use daily as guideline for intensity in rehab;Long Term: Able to use THRR to govern intensity when exercising independently     Able to check pulse independently Yes Yes Yes     Intervention Provide education and demonstration on how to check pulse in carotid and radial arteries.;Review the importance of being able to check your own pulse for safety during independent exercise Provide education and demonstration on how to check pulse in carotid and radial arteries.;Review the importance of being able to check your own pulse for safety during independent exercise Provide education and demonstration on how to check pulse in carotid and radial arteries.;Review the importance of being able to check your own pulse for safety during independent exercise     Expected Outcomes Short Term: Able to explain why pulse checking is important during independent exercise;Long Term: Able to check pulse independently and accurately Short Term: Able to explain why pulse checking is important during independent exercise;Long Term: Able to check pulse independently and accurately Short Term: Able to explain why pulse checking is important during independent exercise;Long  Term: Able to check pulse independently and accurately     Understanding of Exercise Prescription Yes Yes Yes     Intervention Provide education, explanation, and written materials on patient's individual exercise prescription Provide education, explanation, and written materials on patient's individual exercise prescription Provide education, explanation, and written materials on patient's individual exercise prescription     Expected Outcomes Short Term: Able to explain program exercise prescription;Long Term: Able to explain home exercise prescription to exercise independently Short Term: Able to explain program exercise prescription;Long Term: Able to explain home exercise prescription to exercise independently Short Term: Able to explain program exercise prescription;Long Term: Able to explain home exercise prescription to exercise independently            Exercise Goals Re-Evaluation :  Exercise Goals Re-Evaluation    Row Name 05/03/20 1154 05/31/20 1319           Exercise Goal Re-Evaluation   Exercise Goals Review Increase Physical Activity;Increase Strength and Stamina;Able to understand and use rate  of perceived exertion (RPE) scale;Knowledge and understanding of Target Heart Rate Range (THRR);Able to check pulse independently;Understanding of Exercise Prescription Increase Physical Activity;Increase Strength and Stamina;Able to understand and use rate of perceived exertion (RPE) scale;Knowledge and understanding of Target Heart Rate Range (THRR);Able to check pulse independently;Understanding of Exercise Prescription      Comments Pt has attended 6 exercise sessions. He is motivated and is eager for workload increases. He currently exercises at 2.0 METs on the stepper. Will continue to monitor and progress as able. Pt has attended 17 exercise sessions. He continues to stay motivated and continues to increase his workloads. He currently exercises at 1.8 METs on the stepper. Will continue  to monitor and progress as able.      Expected Outcomes Through exercise at rehab and beginning a home exercise program, the patient will reach their stated goals. Through exercise at rehab and by engaging in a home exercise plan, the patient will be able to reach their goals.              Discharge Exercise Prescription (Final Exercise Prescription Changes):  Exercise Prescription Changes - 05/26/20 1200      Response to Exercise   Blood Pressure (Admit) 102/56    Blood Pressure (Exercise) 90/60    Blood Pressure (Exit) 88/50    Heart Rate (Admit) 70 bpm    Heart Rate (Exercise) 105 bpm    Heart Rate (Exit) 80 bpm    Rating of Perceived Exertion (Exercise) 11    Duration Continue with 30 min of aerobic exercise without signs/symptoms of physical distress.    Intensity THRR unchanged      Progression   Progression Continue to progress workloads to maintain intensity without signs/symptoms of physical distress.      Resistance Training   Training Prescription Yes    Weight 3 lbs    Reps 10-15      Treadmill   MPH 2.6    Grade 1    Minutes 17    METs 3.41      NuStep   Level 3    SPM 164    Minutes 22    METs 2.3           Nutrition:  Target Goals: Understanding of nutrition guidelines, daily intake of sodium 1500mg , cholesterol 200mg , calories 30% from fat and 7% or less from saturated fats, daily to have 5 or more servings of fruits and vegetables.  Biometrics:  Pre Biometrics - 04/16/20 1115      Pre Biometrics   Height 5\' 10"  (1.778 m)    Weight 71.2 kg    Waist Circumference 34.5 inches    Hip Circumference 38.5 inches    Waist to Hip Ratio 0.9 %    BMI (Calculated) 22.51    Triceps Skinfold 16 mm    % Body Fat 23.5 %    Grip Strength 34.6 kg    Flexibility 0 in    Single Leg Stand 9.3 seconds            Nutrition Therapy Plan and Nutrition Goals:  Nutrition Therapy & Goals - 05/31/20 1543      Personal Nutrition Goals   Comments Patient  continues to follow a diabetic diet and says he also trys to eat heart healthy. He is trying to make healthier choices. Will continue to monitor.      Intervention Plan   Intervention Nutrition handout(s) given to patient.  Nutrition Assessments:  Nutrition Assessments - 04/16/20 1025      MEDFICTS Scores   Pre Score 6           Nutrition Goals Re-Evaluation:   Nutrition Goals Discharge (Final Nutrition Goals Re-Evaluation):   Psychosocial: Target Goals: Acknowledge presence or absence of significant depression and/or stress, maximize coping skills, provide positive support system. Participant is able to verbalize types and ability to use techniques and skills needed for reducing stress and depression.  Initial Review & Psychosocial Screening:  Initial Psych Review & Screening - 04/16/20 1028      Initial Review   Current issues with None Identified      Family Dynamics   Good Support System? Yes    Comments Patient has no psychosocial barriers identified at his orientation visit. He moved here about 3 years ago to be with his girl friend. He says he has a lot of friends and is active in his chruch. 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 He has a very positive about his life. Will continue to monitor.      Barriers   Psychosocial barriers to participate in program There are no identifiable barriers or psychosocial needs.      Screening Interventions   Interventions Encouraged to exercise    Expected Outcomes Short Term goal: Identification and review with participant of any Quality of Life or Depression concerns found by scoring the questionnaire.;Long Term goal: The participant improves quality of Life and PHQ9 Scores as seen by post scores and/or verbalization of changes           Quality of Life Scores:  Quality of Life - 04/16/20 1116      Quality of Life   Select Quality of Life      Quality of Life Scores   Health/Function Pre  23.3 %    Socioeconomic Pre 27.43 %    Psych/Spiritual Pre 29.14 %    Family Pre 27.4 %    GLOBAL Pre 25.96 %          Scores of 19 and below usually indicate a poorer quality of life in these areas.  A difference of  2-3 points is a clinically meaningful difference.  A difference of 2-3 points in the total score of the Quality of Life Index has been associated with significant improvement in overall quality of life, self-image, physical symptoms, and general health in studies assessing change in quality of life.  PHQ-9: Recent Review Flowsheet Data    Depression screen [facility] 2/9 04/16/2020 10/21/2019 09/30/2019   Decreased Interest 0 0 0   Down, Depressed, Hopeless 0 0 2   PHQ - 2 Score 0 0 2   Altered sleeping 1 - 3   Tired, decreased energy 0 - 2   Change in appetite 0 - 0   Feeling bad or failure about yourself  0 - 1   Trouble concentrating 0 - 0   Moving slowly or fidgety/restless 0 - 0   Suicidal thoughts 0 - 0   PHQ-9 Score 1 - 8   Difficult doing work/chores Not difficult at all - Somewhat difficult     Interpretation of Total Score  Total Score Depression Severity:  1-4 = Minimal depression, 5-9 = Mild depression, 10-14 = Moderate depression, 15-19 = Moderately severe depression, 20-27 = Severe depression   Psychosocial Evaluation and Intervention:  Psychosocial Evaluation - 04/16/20 1031      Psychosocial Evaluation & Interventions  Interventions Encouraged to exercise with the program and follow exercise prescription;Stress management education;Relaxation education    Comments Patient has no psychosical issues identified at his orientation visit. His initial QOL score was 25.96% and his PHQ-9 score was 1. Will continue to monitor.    Expected Outcomes Patient will have no psychosocial issues identified at discharge.    Continue Psychosocial Services  No Follow up required           Psychosocial Re-Evaluation:  Psychosocial Re-Evaluation    [place] Name 05/03/20 1433  05/31/20 1548           Psychosocial Re-Evaluation   Current issues with None Identified None Identified      Comments Patient's initial QOL score was 25.96 and his PHQ-9 score was 1. He continues to have no psychosocial issues identified. Will continue to monitor. Patient's initial QOL score was 25.96 and his PHQ-9 score was 1. He continues to have no psychosocial issues identified. Will continue to monitor.      Expected Outcomes Patient will have no psychosocial issues identified at discharge. Patient will have no psychosocial issues identified at discharge.      Interventions Encouraged to attend Cardiac Rehabilitation for the exercise;Stress management education;Relaxation education Encouraged to attend Cardiac Rehabilitation for the exercise;Stress management education;Relaxation education      Continue Psychosocial Services  No Follow up required No Follow up required             Psychosocial Discharge (Final Psychosocial Re-Evaluation):  Psychosocial Re-Evaluation - 05/31/20 1548      Psychosocial Re-Evaluation   Current issues with None Identified    Comments Patient's initial QOL score was 25.96 and his PHQ-9 score was 1. He continues to have no psychosocial issues identified. Will continue to monitor.    Expected Outcomes Patient will have no psychosocial issues identified at discharge.    Interventions Encouraged to attend Cardiac Rehabilitation for the exercise;Stress management education;Relaxation education    Continue Psychosocial Services  No Follow up required           Vocational Rehabilitation: Provide vocational rehab assistance to qualifying candidates.   Vocational Rehab Evaluation & Intervention:  Vocational Rehab - 04/16/20 1026      Initial Vocational Rehab Evaluation & Intervention   Assessment shows need for Vocational Rehabilitation No      Vocational Rehab Re-Evaulation   Comments Patient is disabled and is not interested in returning to work. He  does not need vocational rehab.           Education: Education Goals: Education classes will be provided on a weekly basis, covering required topics. Participant will state understanding/return demonstration of topics presented.  Learning Barriers/Preferences:  Learning Barriers/Preferences - 04/16/20 1025      Learning Barriers/Preferences   Learning Barriers None    Learning Preferences Written Material;Audio;Skilled Demonstration           Education Topics: Hypertension, Hypertension Reduction -Define heart disease and high blood pressure. Discus how high blood pressure affects the body and ways to reduce high blood pressure.   Exercise and Your Heart -Discuss why it is important to exercise, the FITT principles of exercise, normal and abnormal responses to exercise, and how to exercise safely.   Angina -Discuss definition of angina, causes of angina, treatment of angina, and how to decrease risk of having angina.   Cardiac Medications -Review what the following cardiac medications are used for, how they affect the body, and side effects that may occur when taking  the medications.  Medications include Aspirin, Beta blockers, calcium channel blockers, ACE Inhibitors, angiotensin receptor blockers, diuretics, digoxin, and antihyperlipidemics.   Congestive Heart Failure -Discuss the definition of CHF, how to live with CHF, the signs and symptoms of CHF, and how keep track of weight and sodium intake.   Heart Disease and Intimacy -Discus the effect sexual activity has on the heart, how changes occur during intimacy as we age, and safety during sexual activity.   Smoking Cessation / COPD -Discuss different methods to quit smoking, the health benefits of quitting smoking, and the definition of COPD.   CARDIAC REHAB PHASE II EXERCISE from 06/02/2020 in [place]  Date 04/21/20  Educator DF  Instruction Review Code 2- Demonstrated Understanding       Nutrition I: Fats -Discuss the types of cholesterol, what cholesterol does to the heart, and how cholesterol levels can be controlled.   Nutrition II: Labels -Discuss the different components of food labels and how to read food label   Heart Parts/Heart Disease and PAD -Discuss the anatomy of the heart, the pathway of blood circulation through the heart, and these are affected by heart disease.   Stress I: Signs and Symptoms -Discuss the causes of stress, how stress may lead to anxiety and depression, and ways to limit stress.   CARDIAC REHAB PHASE II EXERCISE from 06/02/2020 in [place]  Date 05/19/20  Educator Etheleen Mayhew  Instruction Review Code 1- Verbalizes Understanding      Stress II: Relaxation -Discuss different types of relaxation techniques to limit stress.   CARDIAC REHAB PHASE II EXERCISE from 06/02/2020 in [place]  Date 05/26/20  Educator DJ  Instruction Review Code 1- Verbalizes Understanding      Warning Signs of Stroke / TIA -Discuss definition of a stroke, what the signs and symptoms are of a stroke, and how to identify when someone is having stroke.   CARDIAC REHAB PHASE II EXERCISE from 06/02/2020 in [place]  Date 06/02/20  Educator DF  Instruction Review Code 2- Demonstrated Understanding      Knowledge Questionnaire Score:  Knowledge Questionnaire Score - 04/16/20 1025      Knowledge Questionnaire Score   Pre Score 18/24           Core Components/Risk Factors/Patient Goals at Admission:  Personal Goals and Risk Factors at Admission - 04/16/20 1026      Core Components/Risk Factors/Patient Goals on Admission    Weight Management Weight Maintenance    Diabetes Yes    Intervention Provide education about signs/symptoms and action to take for hypo/hyperglycemia.;Provide education about proper nutrition, including hydration, and aerobic/resistive exercise prescription  along with prescribed medications to achieve blood glucose in normal ranges: Fasting glucose 65-99 mg/dL    Expected Outcomes Short Term: Participant verbalizes understanding of the signs/symptoms and immediate care of hyper/hypoglycemia, proper foot care and importance of medication, aerobic/resistive exercise and nutrition plan for blood glucose control.    Personal Goal Other Yes    Personal Goal Get stronger to do the things he want to do.    Intervention Patient will attend CR 3 days/week and supplement with exercise 2 days/week at home.    Expected Outcomes Patient will meet both program and personal goals.           Core Components/Risk Factors/Patient Goals Review:   Goals and Risk Factor Review    Row Name 05/03/20 1434 05/31/20 1543  Core Components/Risk Factors/Patient Goals Review   Personal Goals Review Diabetes;Other  Get stronger to do the things he wants to do. Diabetes;Other  Get stronger to do the things he wants to do.      Review Patient has completed 7 sessions losing 3 lbs since his initial visit. He is doing well in the program with consistent attendance with progression. He says he is enjoying the program and is feeling better. He feels like he could be working harder with more resistance on the NuStep. We will continue to progress him as tolerated. He continues to be followed by oncology routinely for f/u pancreatic cancer. Will continue to monitor for progress. Patient has completed 18 sessions maintaining his weight since last 30 day review. He continues to do well in the program with progressiong and very consistent attendance reporting improved strength and stamina. He was seen in the ED 05/28/20 for hyperglycemia with glucose 551 mg/dl. He was elvated by cardiologist also that day. His A1C was 11.3% 05/28/20. He says he was later seen by his PCP with glucose readings 242 mg/dl. He blames hyperglyemic episode on drinking sweet tea at [facility]. His reported  fasting glucose readings have been >120 mg/dl. No changes were made to his DM regime. His blood pressure is well controlled. Will continue to monitor for progress.      Expected Outcomes Patient will complete the program meeting both program and personal goals. Patient will complete the program meeting both program and personal goals.             Core Components/Risk Factors/Patient Goals at Discharge (Final Review):   Goals and Risk Factor Review - 05/31/20 1543      Core Components/Risk Factors/Patient Goals Review   Personal Goals Review Diabetes;Other   Get stronger to do the things he wants to do.   Review Patient has completed 18 sessions maintaining his weight since last 30 day review. He continues to do well in the program with progressiong and very consistent attendance reporting improved strength and stamina. He was seen in the ED 05/28/20 for hyperglycemia with glucose 551 mg/dl. He was elvated by cardiologist also that day. His A1C was 11.3% 05/28/20. He says he was later seen by his PCP with glucose readings 242 mg/dl. He blames hyperglyemic episode on drinking sweet tea at [facility]. His reported fasting glucose readings have been >120 mg/dl. No changes were made to his DM regime. His blood pressure is well controlled. Will continue to monitor for progress.    Expected Outcomes Patient will complete the program meeting both program and personal goals.           ITP Comments:   Comments: ITP REVIEW Pt is making expected progress toward Cardiac Rehab goals after completing 18 sessions. Recommend continued exercise, life style modification, education, and increased stamina and strength.

## 2020-06-02 NOTE — Progress Notes (Signed)
Daily Session Note  Patient Details  Name: Mark Foster MRN: [id number] Date of Birth: Apr 04, 1956 Referring Provider:     CARDIAC REHAB PHASE II EXERCISE from 04/16/2020 in [place]  Referring Provider [name]      Encounter Date: 06/02/2020  Check In:  Session Check In - 06/02/20 1120      Check-In   Supervising physician immediately available to respond to emergencies See telemetry face sheet for immediately available MD    Location [facility]    Staff Present Aundra Dubin, RN, Bjorn Loser, MS, ACSM-CEP, Exercise Physiologist    Virtual Visit No    Medication changes reported     No    Fall or balance concerns reported    No    Tobacco Cessation No Change    Warm-up and Cool-down Performed as group-led instruction    Resistance Training Performed Yes    VAD Patient? No    PAD/SET Patient? No      Pain Assessment   Currently in Pain? No/denies    Pain Score 0-No pain    Multiple Pain Sites No           Capillary Blood Glucose: No results found for this or any previous visit (from the past 24 hour(s)).    Social History   Tobacco Use  Smoking Status Former Smoker  . Start date: 10/06/2019  Smokeless Tobacco Never Used    Goals Met:  Independence with exercise equipment Exercise tolerated well No report of cardiac concerns or symptoms Strength training completed today  Goals Unmet:  Not Applicable  Comments: checkout time is 1200   Dr. [name] is Medical Director for [facility] Pulmonary Rehab.

## 2020-06-04 ENCOUNTER — Encounter (HOSPITAL_COMMUNITY)
Admission: RE | Admit: 2020-06-04 | Discharge: 2020-06-04 | Disposition: A | Discharge status: Home / Self Care | Payer: Medicare HMO | Source: Ambulatory Visit | Attending: Internal Medicine | Admitting: Internal Medicine

## 2020-06-04 ENCOUNTER — Other Ambulatory Visit: Payer: Self-pay

## 2020-06-04 VITALS — Wt 160.9 lb

## 2020-06-04 DIAGNOSIS — Z951 Presence of aortocoronary bypass graft: Secondary | ICD-10-CM | POA: Diagnosis not present

## 2020-06-04 NOTE — Progress Notes (Signed)
Daily Session Note  Patient Details  Name: Mark Foster MRN: [id number] Date of Birth: Aug 07, 1956 Referring Provider:     CARDIAC REHAB PHASE II EXERCISE from 04/16/2020 in [place]  Referring Provider [name]      Encounter Date: 06/04/2020  Check In:  Session Check In - 06/04/20 1105      Check-In   Supervising physician immediately available to respond to emergencies See telemetry face sheet for immediately available MD    Location [facility]    Staff Present Ramon Dredge, RN, Madelaine Bhat, RN, BSN    Virtual Visit No    Medication changes reported     No    Fall or balance concerns reported    No    Tobacco Cessation No Change    Warm-up and Cool-down Performed as group-led instruction    Resistance Training Performed Yes    VAD Patient? No    PAD/SET Patient? No      PAD/SET Patient   Completed foot check today? No    Open wounds to report? No      Pain Assessment   Currently in Pain? No/denies    Pain Score 0-No pain    Multiple Pain Sites No           Capillary Blood Glucose: No results found for this or any previous visit (from the past 24 hour(s)).    Social History   Tobacco Use  Smoking Status Former Smoker  . Start date: 10/06/2019  Smokeless Tobacco Never Used    Goals Met:  Independence with exercise equipment Exercise tolerated well No report of cardiac concerns or symptoms Strength training completed today  Goals Unmet:  Not Applicable  Comments: [phone]   Dr. [name] is Medical Director for [facility] Pul[facility] Re[facility].

## 2020-06-07 ENCOUNTER — Other Ambulatory Visit: Payer: Self-pay

## 2020-06-07 ENCOUNTER — Encounter (HOSPITAL_COMMUNITY): Payer: Medicare HMO

## 2020-06-07 ENCOUNTER — Other Ambulatory Visit: Payer: Medicare HMO

## 2020-06-07 ENCOUNTER — Encounter (INDEPENDENT_AMBULATORY_CARE_PROVIDER_SITE_OTHER): Payer: Self-pay

## 2020-06-07 DIAGNOSIS — Z20822 Contact with and (suspected) exposure to covid-19: Secondary | ICD-10-CM | POA: Diagnosis not present

## 2020-06-08 ENCOUNTER — Other Ambulatory Visit (INDEPENDENT_AMBULATORY_CARE_PROVIDER_SITE_OTHER): Payer: Self-pay | Admitting: Internal Medicine

## 2020-06-08 ENCOUNTER — Encounter (INDEPENDENT_AMBULATORY_CARE_PROVIDER_SITE_OTHER): Payer: Self-pay | Admitting: Internal Medicine

## 2020-06-08 LAB — NOVEL CORONAVIRUS, NAA: SARS-CoV-2, NAA: NOT DETECTED

## 2020-06-08 LAB — SARS-COV-2, NAA 2 DAY TAT

## 2020-06-08 MED ORDER — APIXABAN 5 MG PO TABS: 5.0000 mg | ORAL_TABLET | Freq: Every day | ORAL | 1 refills | Status: DC

## 2020-06-09 ENCOUNTER — Encounter (HOSPITAL_COMMUNITY): Payer: Medicare HMO

## 2020-06-11 ENCOUNTER — Other Ambulatory Visit: Payer: Self-pay

## 2020-06-11 ENCOUNTER — Encounter (HOSPITAL_COMMUNITY)
Admission: RE | Admit: 2020-06-11 | Discharge: 2020-06-11 | Disposition: A | Discharge status: Home / Self Care | Payer: Medicare HMO | Source: Ambulatory Visit | Attending: Internal Medicine | Admitting: Internal Medicine

## 2020-06-11 DIAGNOSIS — Z951 Presence of aortocoronary bypass graft: Secondary | ICD-10-CM

## 2020-06-11 NOTE — Progress Notes (Signed)
Daily Session Note  Patient Details  Name: Mark Foster MRN: [id number] Date of Birth: [birth year] Referring Provider:     CARDIAC REHAB PHASE II EXERCISE from 04/16/2020 in [place]  Referring Provider [name]      Encounter Date: 06/11/2020  Check In:  Session Check In - 06/11/20 1059      Check-In   Supervising physician immediately available to respond to emergencies See telemetry face sheet for immediately available MD    Location [facility]    Staff Present Aundra Dubin, RN, Bjorn Loser, MS, ACSM-CEP, Exercise Physiologist    Virtual Visit No    Medication changes reported     No    Fall or balance concerns reported    No    Tobacco Cessation No Change    Warm-up and Cool-down Performed as group-led instruction    Resistance Training Performed Yes    VAD Patient? No    PAD/SET Patient? No      Pain Assessment   Currently in Pain? No/denies    Pain Score 0-No pain    Multiple Pain Sites No           Capillary Blood Glucose: No results found for this or any previous visit (from the past 24 hour(s)).    Social History   Tobacco Use  Smoking Status Former Smoker  . Start date: 10/06/2019  Smokeless Tobacco Never Used    Goals Met:  Independence with exercise equipment Exercise tolerated well No report of cardiac concerns or symptoms Strength training completed today  Goals Unmet:  Not Applicable  Comments: Check out 1200.   Dr. [name] is Medical Director for [facility] Pulmonary Rehab.

## 2020-06-14 ENCOUNTER — Inpatient Hospital Stay (HOSPITAL_COMMUNITY): Payer: Medicare HMO | Attending: Hematology

## 2020-06-14 ENCOUNTER — Encounter (HOSPITAL_COMMUNITY): Payer: Medicare HMO

## 2020-06-14 ENCOUNTER — Encounter (HOSPITAL_COMMUNITY): Payer: Self-pay

## 2020-06-14 ENCOUNTER — Other Ambulatory Visit: Payer: Self-pay

## 2020-06-14 DIAGNOSIS — Z452 Encounter for adjustment and management of vascular access device: Secondary | ICD-10-CM | POA: Insufficient documentation

## 2020-06-14 DIAGNOSIS — C259 Malignant neoplasm of pancreas, unspecified: Secondary | ICD-10-CM | POA: Insufficient documentation

## 2020-06-14 DIAGNOSIS — Z9221 Personal history of antineoplastic chemotherapy: Secondary | ICD-10-CM | POA: Diagnosis not present

## 2020-06-14 MED ORDER — HEPARIN SOD (PORK) LOCK FLUSH 100 UNIT/ML IV SOLN
500.0000 [IU] | Freq: Once | INTRAVENOUS | Status: AC
  Administered 2020-06-14: 500 [IU] via INTRAVENOUS

## 2020-06-14 MED ORDER — SODIUM CHLORIDE 0.9% FLUSH
10.0000 mL | INTRAVENOUS | Status: DC | PRN
  Administered 2020-06-14: 10 mL via INTRAVENOUS

## 2020-06-14 NOTE — Progress Notes (Signed)
Mark Foster tolerated portacath flush well without complaints or incident. Port accessed with 20 gauge needle with blood return noted then flushed easily per protocol and de-accessed. VSS Pt discharged self ambulatory in satisfactory condition

## 2020-06-14 NOTE — Patient Instructions (Signed)
[facility] Discharge Instructions  Portacath flushed per protocol today. Follow-up as scheduled   Thank you for choosing [facility] at [facility] to provide your oncology and hematology care.  To afford each patient quality time with our provider, please arrive at least 15 minutes before your scheduled appointment time.   If you have a lab appointment with the Cancer Center please come in thru the Main Entrance and check in at the main information desk.  You need to re-schedule your appointment should you arrive 10 or more minutes late.  We strive to give you quality time with our providers, and arriving late affects you and other patients whose appointments are after yours.  Also, if you no show three or more times for appointments you may be dismissed from the clinic at the providers discretion.     Again, thank you for choosing [facility].  Our hope is that these requests will decrease the amount of time that you wait before being seen by our physicians.       _____________________________________________________________  Should you have questions after your visit to [facility], please contact our office at [phone] and follow the prompts.  Our office hours are 8:00 a.m. and 4:30 p.m. Monday - Friday.  Please note that voicemails left after 4:00 p.m. may not be returned until the following business day.  We are closed weekends and major holidays.  You do have access to a nurse 24-7, just call the main number to the clinic [phone] and do not press any options, hold on the line and a nurse will answer the phone.    For prescription refill requests, have your pharmacy contact our office and allow 72 hours.    Due to Covid, you will need to wear a mask upon entering the hospital. If you do not have a mask, a mask will be given to you at the Main Entrance upon arrival. For doctor visits, patients may  have 1 support person age 18 or older with them. For treatment visits, patients can not have anyone with them due to social distancing guidelines and our immunocompromised population.     

## 2020-06-16 ENCOUNTER — Other Ambulatory Visit (INDEPENDENT_AMBULATORY_CARE_PROVIDER_SITE_OTHER): Payer: Self-pay | Admitting: Internal Medicine

## 2020-06-16 ENCOUNTER — Other Ambulatory Visit: Payer: Self-pay

## 2020-06-16 ENCOUNTER — Encounter (HOSPITAL_COMMUNITY)
Admission: RE | Admit: 2020-06-16 | Discharge: 2020-06-16 | Disposition: A | Discharge status: Home / Self Care | Payer: Medicare HMO | Source: Ambulatory Visit | Attending: Internal Medicine | Admitting: Internal Medicine

## 2020-06-16 DIAGNOSIS — Z951 Presence of aortocoronary bypass graft: Secondary | ICD-10-CM | POA: Diagnosis not present

## 2020-06-16 MED ORDER — APIXABAN 5 MG PO TABS: 5.0000 mg | ORAL_TABLET | Freq: Two times a day (BID) | ORAL | 1 refills | Status: DC

## 2020-06-16 NOTE — Progress Notes (Signed)
Daily Session Note  Patient Details  Name: Mark Foster MRN: [id number] Date of Birth: [birth year] Referring Provider:     CARDIAC REHAB PHASE II EXERCISE from 04/16/2020 in [place]  Referring Provider [name]      Encounter Date: 06/16/2020  Check In:  Session Check In - 06/16/20 1100      Check-In   Supervising physician immediately available to respond to emergencies [facility] MD immediately available    Physician(s) Domenic Polite    Location [facility]    Staff Present Aundra Dubin, RN, Bjorn Loser, MS, ACSM-CEP, Exercise Physiologist    Virtual Visit No    Medication changes reported     No    Fall or balance concerns reported    No    Tobacco Cessation No Change    Warm-up and Cool-down Performed as group-led instruction    Resistance Training Performed Yes    VAD Patient? No    PAD/SET Patient? No      Pain Assessment   Currently in Pain? No/denies    Pain Score 0-No pain    Multiple Pain Sites No           Capillary Blood Glucose: No results found for this or any previous visit (from the past 24 hour(s)).    Social History   Tobacco Use  Smoking Status Former Smoker  . Start date: 10/06/2019  Smokeless Tobacco Never Used    Goals Met:  Independence with exercise equipment Exercise tolerated well No report of cardiac concerns or symptoms Strength training completed today  Goals Unmet:  Not Applicable  Comments: Check out 1200.   Dr. [name] is Medical Director for [facility] Pulmonary Rehab.

## 2020-06-18 ENCOUNTER — Other Ambulatory Visit: Payer: Self-pay

## 2020-06-18 ENCOUNTER — Encounter (HOSPITAL_COMMUNITY)
Admission: RE | Admit: 2020-06-18 | Discharge: 2020-06-18 | Disposition: A | Discharge status: Home / Self Care | Payer: Medicare HMO | Source: Ambulatory Visit | Attending: Internal Medicine | Admitting: Internal Medicine

## 2020-06-18 DIAGNOSIS — Z951 Presence of aortocoronary bypass graft: Secondary | ICD-10-CM

## 2020-06-18 NOTE — Progress Notes (Signed)
Daily Session Note  Patient Details  Name: Mark Foster MRN: [id number] Date of Birth: 1956-05-07 Referring Provider:     CARDIAC REHAB PHASE II EXERCISE from 04/16/2020 in [place]  Referring Provider [name]      Encounter Date: 06/18/2020  Check In:  Session Check In - 06/18/20 1056      Check-In   Supervising physician immediately available to respond to emergencies [facility] MD immediately available    Physician(s) Dr. Harrington Challenger    Location [facility]    Staff Present Ramon Dredge, RN, MHA;Dalton Kris Mouton, MS, ACSM-CEP, Exercise Physiologist    Virtual Visit No    Medication changes reported     No    Fall or balance concerns reported    No    Warm-up and Cool-down Performed as group-led instruction    Resistance Training Performed Yes    VAD Patient? No    PAD/SET Patient? No      Pain Assessment   Currently in Pain? No/denies    Pain Score 0-No pain    Multiple Pain Sites No           Capillary Blood Glucose: No results found for this or any previous visit (from the past 24 hour(s)).    Social History   Tobacco Use  Smoking Status Former Smoker  . Start date: 10/06/2019  Smokeless Tobacco Never Used    Goals Met:  Independence with exercise equipment Exercise tolerated well No report of cardiac concerns or symptoms Strength training completed today  Goals Unmet:  Not Applicable  Comments: [phone]   Dr. [name] is Medical Director for [facility] Pulmonary Rehab.

## 2020-06-21 ENCOUNTER — Other Ambulatory Visit (INDEPENDENT_AMBULATORY_CARE_PROVIDER_SITE_OTHER): Payer: Self-pay

## 2020-06-21 ENCOUNTER — Other Ambulatory Visit: Payer: Self-pay | Admitting: "Endocrinology

## 2020-06-21 ENCOUNTER — Encounter (HOSPITAL_COMMUNITY)
Admission: RE | Admit: 2020-06-21 | Discharge: 2020-06-21 | Disposition: A | Discharge status: Home / Self Care | Payer: Medicare HMO | Source: Ambulatory Visit | Attending: Internal Medicine | Admitting: Internal Medicine

## 2020-06-21 ENCOUNTER — Other Ambulatory Visit: Payer: Self-pay

## 2020-06-21 VITALS — Wt 160.1 lb

## 2020-06-21 DIAGNOSIS — Z951 Presence of aortocoronary bypass graft: Secondary | ICD-10-CM | POA: Diagnosis not present

## 2020-06-21 NOTE — Progress Notes (Signed)
Daily Session Note  Patient Details  Name: Mark Foster MRN: [id number] Date of Birth: March 15, 1956 Referring Provider:     CARDIAC REHAB PHASE II EXERCISE from 04/16/2020 in [name]  Referring Provider [name]      Encounter Date: 06/21/2020  Check In:  Session Check In - 06/21/20 1103      Check-In   Supervising physician immediately available to respond to emergencies [facility] MD immediately available    Physician(s) Dr. Harl Bowie    Location [facility]    Staff Present Ramon Dredge, RN, MHA;Dalton Kris Mouton, MS, ACSM-CEP, Exercise Physiologist    Medication changes reported     No    Fall or balance concerns reported    No    Tobacco Cessation No Change    Warm-up and Cool-down Performed as group-led instruction    Resistance Training Performed Yes    VAD Patient? No    PAD/SET Patient? No      Pain Assessment   Currently in Pain? No/denies    Pain Score 0-No pain    Multiple Pain Sites No           Capillary Blood Glucose: No results found for this or any previous visit (from the past 24 hour(s)).    Social History   Tobacco Use  Smoking Status Former Smoker  . Start date: 10/06/2019  Smokeless Tobacco Never Used    Goals Met:  Independence with exercise equipment Exercise tolerated well No report of cardiac concerns or symptoms Strength training completed today  Goals Unmet:  Not Applicable  Comments: [phone]   Dr. [name] is Medical Director for [facility] Pulmonary Rehab.

## 2020-06-22 MED ORDER — APIXABAN 5 MG PO TABS: 5.0000 mg | ORAL_TABLET | Freq: Two times a day (BID) | ORAL | 1 refills | Status: DC

## 2020-06-23 ENCOUNTER — Encounter (HOSPITAL_COMMUNITY): Payer: Medicare HMO

## 2020-06-25 ENCOUNTER — Encounter (HOSPITAL_COMMUNITY)
Admission: RE | Admit: 2020-06-25 | Discharge: 2020-06-25 | Disposition: A | Discharge status: Home / Self Care | Payer: Medicare HMO | Source: Ambulatory Visit | Attending: Internal Medicine | Admitting: Internal Medicine

## 2020-06-25 ENCOUNTER — Other Ambulatory Visit: Payer: Self-pay

## 2020-06-25 DIAGNOSIS — Z951 Presence of aortocoronary bypass graft: Secondary | ICD-10-CM

## 2020-06-25 NOTE — Progress Notes (Signed)
Daily Session Note  Patient Details  Name: Mark Foster MRN: [id number] Date of Birth: March 03, 1956 Referring Provider:     CARDIAC REHAB PHASE II EXERCISE from 04/16/2020 in [place]  Referring Provider [name]      Encounter Date: 06/25/2020  Check In:  Session Check In - 06/25/20 1107      Check-In   Supervising physician immediately available to respond to emergencies [facility] MD immediately available    Physician(s) Dr. Harl Bowie    Location [facility]    Staff Present Ramon Dredge, RN, MHA;Dalton Kris Mouton, MS, ACSM-CEP, Exercise Physiologist    Virtual Visit No    Medication changes reported     No    Fall or balance concerns reported    No    Tobacco Cessation No Change    Warm-up and Cool-down Performed as group-led instruction    Resistance Training Performed Yes    VAD Patient? No    PAD/SET Patient? No      Pain Assessment   Currently in Pain? No/denies    Multiple Pain Sites No           Capillary Blood Glucose: No results found for this or any previous visit (from the past 24 hour(s)).    Social History   Tobacco Use  Smoking Status Former Smoker  . Start date: 10/06/2019  Smokeless Tobacco Never Used    Goals Met:  Independence with exercise equipment Exercise tolerated well No report of cardiac concerns or symptoms Strength training completed today  Goals Unmet:  Not Applicable  Comments: [phone]   Dr. [name] is Medical Director for [facility] Pulmonary Rehab.

## 2020-06-28 ENCOUNTER — Encounter (HOSPITAL_COMMUNITY): Payer: Medicare HMO

## 2020-06-30 ENCOUNTER — Encounter (HOSPITAL_COMMUNITY)
Admission: RE | Admit: 2020-06-30 | Discharge: 2020-06-30 | Disposition: A | Discharge status: Home / Self Care | Payer: Medicare HMO | Source: Ambulatory Visit | Attending: Internal Medicine | Admitting: Internal Medicine

## 2020-06-30 ENCOUNTER — Other Ambulatory Visit: Payer: Self-pay

## 2020-06-30 DIAGNOSIS — Z951 Presence of aortocoronary bypass graft: Secondary | ICD-10-CM

## 2020-06-30 NOTE — Progress Notes (Signed)
Cardiac Individual Treatment Plan  Patient Details  Name: Mark Foster MRN: [id number] Date of Birth: 09-01-1956 Referring Provider:     CARDIAC REHAB PHASE II EXERCISE from 04/16/2020 in [place]  Referring Provider [name]      Initial Encounter Date:    CARDIAC REHAB PHASE II EXERCISE from 04/16/2020 in [place]  Date 04/16/20      Visit Diagnosis: S/P CABG x 4  Patient's Home Medications on Admission:  Current Outpatient Medications:  .  apixaban (ELIQUIS) 5 MG TABS tablet, Take 1 tablet (5 mg total) by mouth 2 (two) times daily., Disp: 180 tablet, Rfl: 1 .  aspirin EC 81 MG EC tablet, Take 1 tablet (81 mg total) by mouth daily., Disp: , Rfl:  .  atorvastatin (LIPITOR) 80 MG tablet, Take 1 tablet (80 mg total) by mouth daily at 6 PM., Disp: 90 tablet, Rfl: 1 .  ezetimibe (ZETIA) 10 MG tablet, Take 1 tablet (10 mg total) by mouth daily., Disp: 90 tablet, Rfl: 1 .  gabapentin (NEURONTIN) 300 MG capsule, Take 1 capsule (300 mg total) by mouth 2 (two) times daily., Disp: 180 capsule, Rfl: 0 .  insulin glargine (LANTUS SOLOSTAR) 100 UNIT/ML Solostar Pen, Inject 36 Units into the skin at bedtime., Disp: 5 pen, Rfl: 2 .  Insulin Pen Needle 32G X 4 MM MISC, 1 Device by Does not apply route daily., Disp: 50 each, Rfl: 2 .  levETIRAcetam (KEPPRA XR) 500 MG 24 hr tablet, Take 1 tablet (500 mg total) by mouth daily., Disp: 90 each, Rfl: 3 .  levothyroxine (SYNTHROID) 50 MCG tablet, Take 1 tablet (50 mcg total) by mouth daily., Disp: 90 tablet, Rfl: 1 .  lidocaine-prilocaine (EMLA) cream, Apply 1 application topically daily as needed (prior to port being accessed (~every 3 months))., Disp: , Rfl:  .  metoprolol tartrate (LOPRESSOR) 25 MG tablet, Take 1 tablet (25 mg total) by mouth 2 (two) times daily., Disp: 180 tablet, Rfl: 3 .  nitroGLYCERIN (NITROSTAT) 0.4 MG SL tablet, Place under the tongue., Disp: , Rfl:  .  NOVOLOG FLEXPEN 100  UNIT/ML FlexPen, INJECT 6 UNITS INTO THE SKIN 3 (THREE) TIMES DAILY WITH MEALS., Disp: 15 mL, Rfl: 0 .  Omega-3 1000 MG CAPS, Take 1,000 mg by mouth daily., Disp: , Rfl:  .  ondansetron (ZOFRAN) 4 MG tablet, Take by mouth., Disp: , Rfl:  .  pantoprazole (PROTONIX) 20 MG tablet, TAKE 1 TABLET EVERY DAY, Disp: 90 tablet, Rfl: 1 .  prochlorperazine (COMPAZINE) 10 MG tablet, Take 10 mg by mouth every 6 (six) hours as needed., Disp: , Rfl:  .  sertraline (ZOLOFT) 50 MG tablet, TAKE 1 TABLET BY MOUTH DAILY (Patient taking differently: Take 50 mg by mouth daily. ), Disp: 90 tablet, Rfl: 1 .  tamsulosin (FLOMAX) 0.4 MG CAPS capsule, Take 1 capsule (0.4 mg total) by mouth daily., Disp: 90 capsule, Rfl: 3 .  traMADol (ULTRAM) 50 MG tablet, Take 50 mg by mouth 4 (four) times daily as needed., Disp: , Rfl:  .  vitamin B-12 (CYANOCOBALAMIN) 1000 MCG tablet, Take 1,000 mcg by mouth daily., Disp: , Rfl:  No current facility-administered medications for this encounter.  Facility-Administered Medications Ordered in Other Encounters:  .  0.9 %  sodium chloride infusion, , Intravenous, Continuous, Derek Jack, MD, Last Rate: 20 mL/hr at 12/30/18 1351, New Bag at 12/30/18 1351 .  0.9 %  sodium chloride infusion, , Intravenous, Continuous, Derek Jack, MD, Last  Rate: 20 mL/hr at 12/30/18 1401, New Bag at 12/30/18 1401 .  dextrose 5 % solution, , Intravenous, Once, Derek Jack, MD .  heparin lock flush 100 unit/mL, 500 Units, Intracatheter, Once PRN, Derek Jack, MD .  sodium chloride flush (NS) 0.9 % injection 10 mL, 10 mL, Intracatheter, PRN, Derek Jack, MD, 10 mL at 12/30/18 0911 .  sodium chloride flush (NS) 0.9 % injection 10 mL, 10 mL, Intracatheter, PRN, Derek Jack, MD, 10 mL at 05/21/19 0845  Past Medical History: Past Medical History:  Diagnosis Date  . CAD (coronary artery disease)    a. 01/2020: CABG x4 with LIMA-LAD, SVG-OM, SVG-PDA and SVG-D1  .  Carotid artery obstruction, left    Left ICA occlusion  . Cervical compression fracture ([place])   . Cervical spinal stenosis   . Coronary artery disease   . Diabetic neuropathy (HCC)    Bilateral legs  . Diverticulitis   . Family history of pancreatic cancer   . History of kidney stones   . Hypothyroidism   . Pancreatic cancer ([place])   . Stenosis of left vertebral artery   . 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 Stroke [facility])    2011  . Type 2 diabetes mellitus (HCC)     Tobacco Use: Social History   Tobacco Use  Smoking Status Former Smoker  . Start date: 10/06/2019  Smokeless Tobacco Never Used    Labs: Recent Review Flowsheet Data    Labs for ITP Cardiac and Pulmonary Rehab Latest Ref Rng & Units 01/26/2020 01/26/2020 01/26/2020 01/26/2020 05/17/2020   Cholestrol <200 mg/dL - - - - 111   LDLCALC mg/dL (calc) - - - - 44   HDL > OR = 40 mg/dL - - - - 50   Trlycerides <150 mg/dL - - - - 90   Hemoglobin A1c <5.7 % of total Hgb - - - - 11.3(H)   PHART 7.35 - 7.45 7.398 7.280(L) 7.310(L) 7.314(L) -   PCO2ART 32 - 48 mmHg 41.9 51.4(H) 47.0 45.9 -   HCO3 20.0 - 28.0 mmol/L 26.0 24.0 23.4 23.1 -   TCO2 22 - 32 mmol/L 27 25 25 24  -   ACIDBASEDEF 0.0 - 2.0 mmol/L - 3.0(H) 3.0(H) 3.0(H) -   O2SAT % 99.0 98.0 98.0 96.0 -      Capillary Blood Glucose: Lab Results  Component Value Date   GLUCAP 230 (H) 05/18/2020   GLUCAP 152 (H) 01/31/2020   GLUCAP 97 01/31/2020   GLUCAP 89 01/30/2020   GLUCAP 186 (H) 01/30/2020     Exercise Target Goals: Exercise Program Goal: Individual exercise prescription set using results from initial 6 min walk test and THRR while considering  patient's activity barriers and safety.   Exercise Prescription Goal: Starting with aerobic activity 30 plus minutes a day, 3 days per week for initial exercise prescription. Provide home exercise prescription and guidelines that participant acknowledges understanding prior to discharge.  Activity Barriers & Risk  Stratification:   6 Minute Walk:  6 Minute Walk    Row Name 04/16/20 1109         6 Minute Walk   Phase Initial     Distance 1300 feet     Walk Time 6 minutes     # of Rest Breaks 0     MPH 2.46     METS 3.45     RPE 12.07     VO2 Peak 11     Symptoms No     Resting  HR 92 bpm     Resting BP 100/60     Resting Oxygen Saturation  98 %     Exercise Oxygen Saturation  during 6 min walk 100 %     Max Ex. HR 98 bpm     Max Ex. BP 108/62     2 Minute Post BP 98/64            Oxygen Initial Assessment:   Oxygen Re-Evaluation:   Oxygen Discharge (Final Oxygen Re-Evaluation):   Initial Exercise Prescription:  Initial Exercise Prescription - 04/16/20 1100      Date of Initial Exercise RX and Referring Provider   Date 04/16/20    Referring Provider [name]    Expected Discharge Date 07/17/20      Treadmill   MPH 1.7    Grade 0    Minutes 17    METs 2.3      NuStep   Level 2    SPM 80    Minutes 22    METs 2.5      Prescription Details   Frequency (times per week) 3    Duration Progress to 30 minutes of continuous aerobic without signs/symptoms of physical distress      Intensity   THRR 40-80% of Max Heartrate 62-124    Ratings of Perceived Exertion 11-13    Perceived Dyspnea 0-4      Progression   Progression Continue progressive overload as per policy without signs/symptoms or physical distress.      Resistance Training   Training Prescription Yes    Weight 3    Reps 10-15           Perform Capillary Blood Glucose checks as needed.  Exercise Prescription Changes:   Exercise Prescription Changes    Row Name 04/26/20 1200 05/03/20 1100 05/12/20 1300 05/26/20 1200 06/09/20 1200     Response to Exercise   Blood Pressure (Admit) 116/64 -- 110/60 102/56 106/60   Blood Pressure (Exercise) 140/64 -- 140/62 90/60 134/68   Blood Pressure (Exit) 102/64 -- 92/60 88/50 112/68   Heart Rate (Admit) 92 bpm -- 90 bpm 70 bpm 85 bpm   Heart Rate  (Exercise) 98 bpm -- 142 bpm 105 bpm 111 bpm   Heart Rate (Exit) 109 bpm -- 101 bpm 80 bpm 85 bpm   Rating of Perceived Exertion (Exercise) 10 -- 10 11 11    Duration Continue with 30 min of aerobic exercise without signs/symptoms of physical distress. -- Continue with 30 min of aerobic exercise without signs/symptoms of physical distress. Continue with 30 min of aerobic exercise without signs/symptoms of physical distress. Progress to 30 minutes of  aerobic without signs/symptoms of physical distress   Intensity THRR unchanged -- THRR unchanged THRR unchanged THRR unchanged     Progression   Progression Continue to progress workloads to maintain intensity without signs/symptoms of physical distress. -- Continue to progress workloads to maintain intensity without signs/symptoms of physical distress. Continue to progress workloads to maintain intensity without signs/symptoms of physical distress. Continue to progress workloads to maintain intensity without signs/symptoms of physical distress.     Resistance Training   Training Prescription Yes -- Yes Yes Yes   Weight 3 -- 3 lbs 3 lbs 3 lbs   Reps 10-15 -- 10-15 10-15 10-15   Time -- -- -- -- 10 Minutes     Treadmill   MPH 1.9 -- 2.3 2.6 2.7   Grade 0 -- 0 1 1   Minutes 17 --  17 17 17    METs 2.45 -- 2.76 3.41 3.42     NuStep   Level 2 -- 3 3 3    SPM 121 -- 159 164 173   Minutes 22 -- 22 22 22    METs 2.9 -- 2.2 2.3 1.9     Home Exercise Plan   Plans to continue exercise at -- [employer] (comment) -- -- --   Frequency -- Add 2 additional days to program exercise sessions. -- -- --   Initial Home Exercises Provided -- 05/03/20 -- -- --   [place] Name 06/21/20 1400 06/25/20 1453           Response to Exercise   Blood Pressure (Admit) 108/62 104/58      Blood Pressure (Exercise) 136/66 108/62      Blood Pressure (Exit) 102/60 108/62      Heart Rate (Admit) 77 bpm 79 bpm      Heart Rate (Exercise) 119 bpm 115 bpm      Heart Rate  (Exit) 84 bpm 75 bpm      Rating of Perceived Exertion (Exercise) 11 12      Duration Continue with 30 min of aerobic exercise without signs/symptoms of physical distress. Continue with 30 min of aerobic exercise without signs/symptoms of physical distress.      Intensity THRR unchanged THRR unchanged        Progression   Progression Continue to progress workloads to maintain intensity without signs/symptoms of physical distress. Continue to progress workloads to maintain intensity without signs/symptoms of physical distress.        Resistance Training   Training Prescription Yes Yes      Weight 3 lbs 3 lbs      Reps 10-15 10-15      Time 10 Minutes 10 Minutes        Treadmill   MPH 2.9 3      Grade 1 1      Minutes 17 17      METs 3.62 6.7        NuStep   Level 3 5      SPM 180 158      Minutes 22 22      METs 2.3 2.8             Exercise Comments:   Exercise Comments    Row Name 05/03/20 1126           Exercise Comments home exercise reviewed              Exercise Goals and Review:   Exercise Goals    Row Name 04/16/20 1114 05/03/20 1154 05/31/20 1319 06/28/20 1455       Exercise Goals   Increase Physical Activity Yes Yes Yes Yes    Intervention Provide advice, education, support and counseling about physical activity/exercise needs.;Develop an individualized exercise prescription for aerobic and resistive training based on initial evaluation findings, risk stratification, comorbidities and participant's personal goals. Provide advice, education, support and counseling about physical activity/exercise needs.;Develop an individualized exercise prescription for aerobic and resistive training based on initial evaluation findings, risk stratification, comorbidities and participant's personal goals. Provide advice, education, support and counseling about physical activity/exercise needs.;Develop an individualized exercise prescription for aerobic and resistive  training based on initial evaluation findings, risk stratification, comorbidities and participant's personal goals. Provide advice, education, support and counseling about physical activity/exercise needs.;Develop an individualized exercise prescription for aerobic and resistive training based on initial evaluation findings, risk stratification, comorbidities  and participant's personal goals.    Expected Outcomes Short Term: Attend rehab on a regular basis to increase amount of physical activity.;Long Term: Add in home exercise to make exercise part of routine and to increase amount of physical activity.;Long Term: Exercising regularly at least 3-5 days a week. Short Term: Attend rehab on a regular basis to increase amount of physical activity.;Long Term: Add in home exercise to make exercise part of routine and to increase amount of physical activity.;Long Term: Exercising regularly at least 3-5 days a week. Short Term: Attend rehab on a regular basis to increase amount of physical activity.;Long Term: Add in home exercise to make exercise part of routine and to increase amount of physical activity.;Long Term: Exercising regularly at least 3-5 days a week. Short Term: Attend rehab on a regular basis to increase amount of physical activity.;Long Term: Add in home exercise to make exercise part of routine and to increase amount of physical activity.;Long Term: Exercising regularly at least 3-5 days a week.    Increase Strength and Stamina Yes Yes Yes Yes    Intervention Provide advice, education, support and counseling about physical activity/exercise needs.;Develop an individualized exercise prescription for aerobic and resistive training based on initial evaluation findings, risk stratification, comorbidities and participant's personal goals. Provide advice, education, support and counseling about physical activity/exercise needs.;Develop an individualized exercise prescription for aerobic and resistive training  based on initial evaluation findings, risk stratification, comorbidities and participant's personal goals. Provide advice, education, support and counseling about physical activity/exercise needs.;Develop an individualized exercise prescription for aerobic and resistive training based on initial evaluation findings, risk stratification, comorbidities and participant's personal goals. Provide advice, education, support and counseling about physical activity/exercise needs.;Develop an individualized exercise prescription for aerobic and resistive training based on initial evaluation findings, risk stratification, comorbidities and participant's personal goals.    Expected Outcomes Short Term: Increase workloads from initial exercise prescription for resistance, speed, and METs.;Short Term: Perform resistance training exercises routinely during rehab and add in resistance training at home;Long Term: Improve cardiorespiratory fitness, muscular endurance and strength as measured by increased METs and functional capacity (6MWT) Short Term: Increase workloads from initial exercise prescription for resistance, speed, and METs.;Short Term: Perform resistance training exercises routinely during rehab and add in resistance training at home;Long Term: Improve cardiorespiratory fitness, muscular endurance and strength as measured by increased METs and functional capacity (6MWT) Short Term: Increase workloads from initial exercise prescription for resistance, speed, and METs.;Short Term: Perform resistance training exercises routinely during rehab and add in resistance training at home;Long Term: Improve cardiorespiratory fitness, muscular endurance and strength as measured by increased METs and functional capacity (6MWT) Short Term: Increase workloads from initial exercise prescription for resistance, speed, and METs.;Short Term: Perform resistance training exercises routinely during rehab and add in resistance training at  home;Long Term: Improve cardiorespiratory fitness, muscular endurance and strength as measured by increased METs and functional capacity (6MWT)    Able to understand and use rate of perceived exertion (RPE) scale Yes Yes Yes Yes    Intervention Provide education and explanation on how to use RPE scale Provide education and explanation on how to use RPE scale Provide education and explanation on how to use RPE scale Provide education and explanation on how to use RPE scale    Expected Outcomes Short Term: Able to use RPE daily in rehab to express subjective intensity level;Long Term:  Able to use RPE to guide intensity level when exercising independently Short Term: Able to use RPE daily  in rehab to express subjective intensity level;Long Term:  Able to use RPE to guide intensity level when exercising independently Short Term: Able to use RPE daily in rehab to express subjective intensity level;Long Term:  Able to use RPE to guide intensity level when exercising independently Short Term: Able to use RPE daily in rehab to express subjective intensity level;Long Term:  Able to use RPE to guide intensity level when exercising independently    Knowledge and understanding of Target Heart Rate Range (THRR) Yes Yes Yes Yes    Intervention Provide education and explanation of THRR including how the numbers were predicted and where they are located for reference Provide education and explanation of THRR including how the numbers were predicted and where they are located for reference Provide education and explanation of THRR including how the numbers were predicted and where they are located for reference Provide education and explanation of THRR including how the numbers were predicted and where they are located for reference    Expected Outcomes Short Term: Able to state/look up THRR;Short Term: Able to use daily as guideline for intensity in rehab;Long Term: Able to use THRR to govern intensity when exercising  independently Short Term: Able to state/look up THRR;Short Term: Able to use daily as guideline for intensity in rehab;Long Term: Able to use THRR to govern intensity when exercising independently Short Term: Able to state/look up THRR;Short Term: Able to use daily as guideline for intensity in rehab;Long Term: Able to use THRR to govern intensity when exercising independently Short Term: Able to state/look up THRR;Short Term: Able to use daily as guideline for intensity in rehab;Long Term: Able to use THRR to govern intensity when exercising independently    Able to check pulse independently Yes Yes Yes Yes    Intervention Provide education and demonstration on how to check pulse in carotid and radial arteries.;Review the importance of being able to check your own pulse for safety during independent exercise Provide education and demonstration on how to check pulse in carotid and radial arteries.;Review the importance of being able to check your own pulse for safety during independent exercise Provide education and demonstration on how to check pulse in carotid and radial arteries.;Review the importance of being able to check your own pulse for safety during independent exercise Provide education and demonstration on how to check pulse in carotid and radial arteries.;Review the importance of being able to check your own pulse for safety during independent exercise    Expected Outcomes Short Term: Able to explain why pulse checking is important during independent exercise;Long Term: Able to check pulse independently and accurately Short Term: Able to explain why pulse checking is important during independent exercise;Long Term: Able to check pulse independently and accurately Short Term: Able to explain why pulse checking is important during independent exercise;Long Term: Able to check pulse independently and accurately Short Term: Able to explain why pulse checking is important during independent exercise;Long  Term: Able to check pulse independently and accurately    Understanding of Exercise Prescription Yes Yes Yes Yes    Intervention Provide education, explanation, and written materials on patient's individual exercise prescription Provide education, explanation, and written materials on patient's individual exercise prescription Provide education, explanation, and written materials on patient's individual exercise prescription Provide education, explanation, and written materials on patient's individual exercise prescription    Expected Outcomes Short Term: Able to explain program exercise prescription;Long Term: Able to explain home exercise prescription to exercise independently Short Term: Able to  explain program exercise prescription;Long Term: Able to explain home exercise prescription to exercise independently Short Term: Able to explain program exercise prescription;Long Term: Able to explain home exercise prescription to exercise independently Short Term: Able to explain program exercise prescription;Long Term: Able to explain home exercise prescription to exercise independently           Exercise Goals Re-Evaluation :  Exercise Goals Re-Evaluation    Row Name 05/03/20 1154 05/31/20 1319 06/28/20 1455         Exercise Goal Re-Evaluation   Exercise Goals Review Increase Physical Activity;Increase Strength and Stamina;Able to understand and use rate of perceived exertion (RPE) scale;Knowledge and understanding of Target Heart Rate Range (THRR);Able to check pulse independently;Understanding of Exercise Prescription Increase Physical Activity;Increase Strength and Stamina;Able to understand and use rate of perceived exertion (RPE) scale;Knowledge and understanding of Target Heart Rate Range (THRR);Able to check pulse independently;Understanding of Exercise Prescription Increase Physical Activity;Increase Strength and Stamina;Able to understand and use rate of perceived exertion (RPE)  scale;Knowledge and understanding of Target Heart Rate Range (THRR);Able to check pulse independently;Understanding of Exercise Prescription     Comments Pt has attended 6 exercise sessions. He is motivated and is eager for workload increases. He currently exercises at 2.0 METs on the stepper. Will continue to monitor and progress as able. Pt has attended 17 exercise sessions. He continues to stay motivated and continues to increase his workloads. He currently exercises at 1.8 METs on the stepper. Will continue to monitor and progress as able. Pt has attended 24 exercise sessions. He continues to stay motivated and increase his workloads. He currently exercises at 2.8 METs on the stepper. He will be graduating the program in about 2 weeks, so I will need to discuss with him his plans for continuing exercise. Will continue to monitor and progress as able.     Expected Outcomes Through exercise at rehab and beginning a home exercise program, the patient will reach their stated goals. Through exercise at rehab and by engaging in a home exercise plan, the patient will be able to reach their goals. Through exercise at rehab and by engaging in a home exercise plan, the patient will be able to reach their goals.             Discharge Exercise Prescription (Final Exercise Prescription Changes):  Exercise Prescription Changes - 06/25/20 1453      Response to Exercise   Blood Pressure (Admit) 104/58    Blood Pressure (Exercise) 108/62    Blood Pressure (Exit) 108/62    Heart Rate (Admit) 79 bpm    Heart Rate (Exercise) 115 bpm    Heart Rate (Exit) 75 bpm    Rating of Perceived Exertion (Exercise) 12    Duration Continue with 30 min of aerobic exercise without signs/symptoms of physical distress.    Intensity THRR unchanged      Progression   Progression Continue to progress workloads to maintain intensity without signs/symptoms of physical distress.      Resistance Training   Training Prescription  Yes    Weight 3 lbs    Reps 10-15    Time 10 Minutes      Treadmill   MPH 3    Grade 1    Minutes 17    METs 6.7      NuStep   Level 5    SPM 158    Minutes 22    METs 2.8  Nutrition:  Target Goals: Understanding of nutrition guidelines, daily intake of sodium 1500mg , cholesterol 200mg , calories 30% from fat and 7% or less from saturated fats, daily to have 5 or more servings of fruits and vegetables.  Biometrics:  Pre Biometrics - 04/16/20 1115      Pre Biometrics   Height 5\' 10"  (1.778 m)    Weight 71.2 kg    Waist Circumference 34.5 inches    Hip Circumference 38.5 inches    Waist to Hip Ratio 0.9 %    BMI (Calculated) 22.51    Triceps Skinfold 16 mm    % Body Fat 23.5 %    Grip Strength 34.6 kg    Flexibility 0 in    Single Leg Stand 9.3 seconds            Nutrition Therapy Plan and Nutrition Goals:  Nutrition Therapy & Goals - 06/25/20 1552      Personal Nutrition Goals   Comments Patient continues to follow a diabetic diet and says he also trys to eat heart healthy. He is trying to make healthier choices. We continue to provide education through hand-outs regarding making healthier diet choices. Will continue to monitor.      Intervention Plan   Intervention Nutrition handout(s) given to patient.           Nutrition Assessments:  Nutrition Assessments - 04/16/20 1025      MEDFICTS Scores   Pre Score 6           Nutrition Goals Re-Evaluation:   Nutrition Goals Discharge (Final Nutrition Goals Re-Evaluation):   Psychosocial: Target Goals: Acknowledge presence or absence of significant depression and/or stress, maximize coping skills, provide positive support system. Participant is able to verbalize types and ability to use techniques and skills needed for reducing stress and depression.  Initial Review & Psychosocial Screening:  Initial Psych Review & Screening - 04/16/20 1028      Initial Review   Current issues with  None Identified      Family Dynamics   Good Support System? Yes    Comments Patient has no psychosocial barriers identified at his orientation visit. He moved here about 3 years ago to be with his girl friend. He says he has a lot of friends and is active in his chruch. 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 He has a very positive about his life. Will continue to monitor.      Barriers   Psychosocial barriers to participate in program There are no identifiable barriers or psychosocial needs.      Screening Interventions   Interventions Encouraged to exercise    Expected Outcomes Short Term goal: Identification and review with participant of any Quality of Life or Depression concerns found by scoring the questionnaire.;Long Term goal: The participant improves quality of Life and PHQ9 Scores as seen by post scores and/or verbalization of changes           Quality of Life Scores:  Quality of Life - 04/16/20 1116      Quality of Life   Select Quality of Life      Quality of Life Scores   Health/Function Pre 23.3 %    Socioeconomic Pre 27.43 %    Psych/Spiritual Pre 29.14 %    Family Pre 27.4 %    GLOBAL Pre 25.96 %          Scores of 19 and below usually indicate a poorer quality of life in  these areas.  A difference of  2-3 points is a clinically meaningful difference.  A difference of 2-3 points in the total score of the Quality of Life Index has been associated with significant improvement in overall quality of life, self-image, physical symptoms, and general health in studies assessing change in quality of life.  PHQ-9: Recent Review Flowsheet Data    Depression screen [facility] 2/9 04/16/2020 10/21/2019 09/30/2019   Decreased Interest 0 0 0   Down, Depressed, Hopeless 0 0 2   PHQ - 2 Score 0 0 2   Altered sleeping 1 - 3   Tired, decreased energy 0 - 2   Change in appetite 0 - 0   Feeling bad or failure about yourself  0 - 1   Trouble concentrating 0 - 0   Moving  slowly or fidgety/restless 0 - 0   Suicidal thoughts 0 - 0   PHQ-9 Score 1 - 8   Difficult doing work/chores Not difficult at all - Somewhat difficult     Interpretation of Total Score  Total Score Depression Severity:  1-4 = Minimal depression, 5-9 = Mild depression, 10-14 = Moderate depression, 15-19 = Moderately severe depression, 20-27 = Severe depression   Psychosocial Evaluation and Intervention:  Psychosocial Evaluation - 04/16/20 1031      Psychosocial Evaluation & Interventions   Interventions Encouraged to exercise with the program and follow exercise prescription;Stress management education;Relaxation education    Comments Patient has no psychosical issues identified at his orientation visit. His initial QOL score was 25.96% and his PHQ-9 score was 1. Will continue to monitor.    Expected Outcomes Patient will have no psychosocial issues identified at discharge.    Continue Psychosocial Services  No Follow up required           Psychosocial Re-Evaluation:  Psychosocial Re-Evaluation    [place] Name 05/03/20 1433 05/31/20 1548 06/25/20 1553         Psychosocial Re-Evaluation   Current issues with None Identified None Identified None Identified     Comments Patient's initial QOL score was 25.96 and his PHQ-9 score was 1. He continues to have no psychosocial issues identified. Will continue to monitor. Patient's initial QOL score was 25.96 and his PHQ-9 score was 1. He continues to have no psychosocial issues identified. Will continue to monitor. Patient continues to have no psychosocial issues identified. He continues to have a positive outlook.  Will continue to monitor.     Expected Outcomes Patient will have no psychosocial issues identified at discharge. Patient will have no psychosocial issues identified at discharge. Patient will have no psychosocial issues identified at discharge.     Interventions Encouraged to attend Cardiac Rehabilitation for the exercise;Stress  management education;Relaxation education Encouraged to attend Cardiac Rehabilitation for the exercise;Stress management education;Relaxation education Encouraged to attend Cardiac Rehabilitation for the exercise;Stress management education;Relaxation education     Continue Psychosocial Services  No Follow up required No Follow up required No Follow up required            Psychosocial Discharge (Final Psychosocial Re-Evaluation):  Psychosocial Re-Evaluation - 06/25/20 1553      Psychosocial Re-Evaluation   Current issues with None Identified    Comments Patient continues to have no psychosocial issues identified. He continues to have a positive outlook.  Will continue to monitor.    Expected Outcomes Patient will have no psychosocial issues identified at discharge.    Interventions Encouraged to attend Cardiac Rehabilitation for the exercise;Stress management education;Relaxation  education    Continue Psychosocial Services  No Follow up required           Vocational Rehabilitation: Provide vocational rehab assistance to qualifying candidates.   Vocational Rehab Evaluation & Intervention:  Vocational Rehab - 04/16/20 1026      Initial Vocational Rehab Evaluation & Intervention   Assessment shows need for Vocational Rehabilitation No      Vocational Rehab Re-Evaulation   Comments Patient is disabled and is not interested in returning to work. He does not need vocational rehab.           Education: Education Goals: Education classes will be provided on a weekly basis, covering required topics. Participant will state understanding/return demonstration of topics presented.  Learning Barriers/Preferences:  Learning Barriers/Preferences - 04/16/20 1025      Learning Barriers/Preferences   Learning Barriers None    Learning Preferences Written Material;Audio;Skilled Demonstration           Education Topics: Hypertension, Hypertension Reduction -Define heart disease and  high blood pressure. Discus how high blood pressure affects the body and ways to reduce high blood pressure.   Exercise and Your Heart -Discuss why it is important to exercise, the FITT principles of exercise, normal and abnormal responses to exercise, and how to exercise safely.   Angina -Discuss definition of angina, causes of angina, treatment of angina, and how to decrease risk of having angina.   CARDIAC REHAB PHASE II EXERCISE from 06/25/2020 in [place]  Date 06/25/20  Educator DF  Instruction Review Code 2- Demonstrated Understanding      Cardiac Medications -Review what the following cardiac medications are used for, how they affect the body, and side effects that may occur when taking the medications.  Medications include Aspirin, Beta blockers, calcium channel blockers, ACE Inhibitors, angiotensin receptor blockers, diuretics, digoxin, and antihyperlipidemics.   Congestive Heart Failure -Discuss the definition of CHF, how to live with CHF, the signs and symptoms of CHF, and how keep track of weight and sodium intake.   Heart Disease and Intimacy -Discus the effect sexual activity has on the heart, how changes occur during intimacy as we age, and safety during sexual activity.   Smoking Cessation / COPD -Discuss different methods to quit smoking, the health benefits of quitting smoking, and the definition of COPD.   CARDIAC REHAB PHASE II EXERCISE from 06/25/2020 in [place]  Date 04/21/20  Educator DF  Instruction Review Code 2- Demonstrated Understanding      Nutrition I: Fats -Discuss the types of cholesterol, what cholesterol does to the heart, and how cholesterol levels can be controlled.   Nutrition II: Labels -Discuss the different components of food labels and how to read food label   Heart Parts/Heart Disease and PAD -Discuss the anatomy of the heart, the pathway of blood circulation through the heart,  and these are affected by heart disease.   Stress I: Signs and Symptoms -Discuss the causes of stress, how stress may lead to anxiety and depression, and ways to limit stress.   CARDIAC REHAB PHASE II EXERCISE from 06/25/2020 in [place]  Date 05/19/20  Educator Etheleen Mayhew  Instruction Review Code 1- Verbalizes Understanding      Stress II: Relaxation -Discuss different types of relaxation techniques to limit stress.   CARDIAC REHAB PHASE II EXERCISE from 06/25/2020 in [place]  Date 05/26/20  Educator DJ  Instruction Review Code 1- Verbalizes Understanding  Warning Signs of Stroke / TIA -Discuss definition of a stroke, what the signs and symptoms are of a stroke, and how to identify when someone is having stroke.   CARDIAC REHAB PHASE II EXERCISE from 06/25/2020 in [place]  Date 06/02/20  Educator DF  Instruction Review Code 2- Demonstrated Understanding      Knowledge Questionnaire Score:  Knowledge Questionnaire Score - 04/16/20 1025      Knowledge Questionnaire Score   Pre Score 18/24           Core Components/Risk Factors/Patient Goals at Admission:  Personal Goals and Risk Factors at Admission - 04/16/20 1026      Core Components/Risk Factors/Patient Goals on Admission    Weight Management Weight Maintenance    Diabetes Yes    Intervention Provide education about signs/symptoms and action to take for hypo/hyperglycemia.;Provide education about proper nutrition, including hydration, and aerobic/resistive exercise prescription along with prescribed medications to achieve blood glucose in normal ranges: Fasting glucose 65-99 mg/dL    Expected Outcomes Short Term: Participant verbalizes understanding of the signs/symptoms and immediate care of hyper/hypoglycemia, proper foot care and importance of medication, aerobic/resistive exercise and nutrition plan for blood glucose control.     Personal Goal Other Yes    Personal Goal Get stronger to do the things he want to do.    Intervention Patient will attend CR 3 days/week and supplement with exercise 2 days/week at home.    Expected Outcomes Patient will meet both program and personal goals.           Core Components/Risk Factors/Patient Goals Review:   Goals and Risk Factor Review    Row Name 05/03/20 1434 05/31/20 1543 06/25/20 1554         Core Components/Risk Factors/Patient Goals Review   Personal Goals Review Diabetes;Other  Get stronger to do the things he wants to do. Diabetes;Other  Get stronger to do the things he wants to do. Diabetes     Review Patient has completed 7 sessions losing 3 lbs since his initial visit. He is doing well in the program with consistent attendance with progression. He says he is enjoying the program and is feeling better. He feels like he could be working harder with more resistance on the NuStep. We will continue to progress him as tolerated. He continues to be followed by oncology routinely for f/u pancreatic cancer. Will continue to monitor for progress. Patient has completed 18 sessions maintaining his weight since last 30 day review. He continues to do well in the program with progressiong and very consistent attendance reporting improved strength and stamina. He was seen in the ED 05/28/20 for hyperglycemia with glucose 551 mg/dl. He was elvated by cardiologist also that day. His A1C was 11.3% 05/28/20. He says he was later seen by his PCP with glucose readings 242 mg/dl. He blames hyperglyemic episode on drinking sweet tea at [facility]. His reported fasting glucose readings have been >120 mg/dl. No changes were made to his DM regime. His blood pressure is well controlled. Will continue to monitor for progress. Patient has completed 25 sessions gaining 9 lbs since last 30 day review. He has multiple risk factors for CAD. His personal goals to get stronger and to do the things he wants to do.  His blood pressure continues to be WNL. His reproted glucose reading continue to be WNL with no new hgb A1C since the 11.3% 05/28/20. He continues to do well in the program with progression and consistent  attendance. He recently moved and is glad to be getting settled into his new home. He says he does feel stronger and feels he is meeting his personal goals. Will continue to monitor for progress.     Expected Outcomes Patient will complete the program meeting both program and personal goals. Patient will complete the program meeting both program and personal goals. Patient will complete the program meeting both program and personal goals.            Core Components/Risk Factors/Patient Goals at Discharge (Final Review):   Goals and Risk Factor Review - 06/25/20 1554      Core Components/Risk Factors/Patient Goals Review   Personal Goals Review Diabetes    Review Patient has completed 25 sessions gaining 9 lbs since last 30 day review. He has multiple risk factors for CAD. His personal goals to get stronger and to do the things he wants to do. His blood pressure continues to be WNL. His reproted glucose reading continue to be WNL with no new hgb A1C since the 11.3% 05/28/20. He continues to do well in the program with progression and consistent attendance. He recently moved and is glad to be getting settled into his new home. He says he does feel stronger and feels he is meeting his personal goals. Will continue to monitor for progress.    Expected Outcomes Patient will complete the program meeting both program and personal goals.           ITP Comments:   Comments: ITP REVIEW Pt is making expected progress toward Cardiac Rehab goals after completing 25 sessions. Recommend continued exercise, life style modification, education, and increased stamina and strength.

## 2020-06-30 NOTE — Progress Notes (Signed)
Daily Session Note  Patient Details  Name: NISHAAN STANKE MRN: [id number] Date of Birth: 1956-05-22 Referring Provider:     CARDIAC REHAB PHASE II EXERCISE from 04/16/2020 in [place]  Referring Provider [name]      Encounter Date: 06/30/2020  Check In:  Session Check In - 06/30/20 1108      Check-In   Supervising physician immediately available to respond to emergencies [facility] MD immediately available    Physician(s) Dr. Harl Bowie    Location [facility]    Staff Present Aundra Dubin, RN, Bjorn Loser, MS, ACSM-CEP, Exercise Physiologist    Virtual Visit No    Medication changes reported     No    Fall or balance concerns reported    No    Tobacco Cessation No Change    Warm-up and Cool-down Performed as group-led instruction    Resistance Training Performed Yes    VAD Patient? No    PAD/SET Patient? No      PAD/SET Patient   Completed foot check today? No    Open wounds to report? No      Pain Assessment   Currently in Pain? No/denies    Pain Score 0-No pain    Multiple Pain Sites No           Capillary Blood Glucose: No results found for this or any previous visit (from the past 24 hour(s)).    Social History   Tobacco Use  Smoking Status Former Smoker  . Start date: 10/06/2019  Smokeless Tobacco Never Used    Goals Met:  Independence with exercise equipment Exercise tolerated well No report of cardiac concerns or symptoms Strength training completed today  Goals Unmet:  Not Applicable  Comments: checkout time is 1200   Dr. [name] is Medical Director for [facility] Pulmonary Rehab.

## 2020-07-02 ENCOUNTER — Encounter (HOSPITAL_COMMUNITY)
Admission: RE | Admit: 2020-07-02 | Discharge: 2020-07-02 | Disposition: A | Discharge status: Home / Self Care | Payer: Medicare HMO | Source: Ambulatory Visit | Attending: Internal Medicine | Admitting: Internal Medicine

## 2020-07-02 ENCOUNTER — Other Ambulatory Visit: Payer: Self-pay

## 2020-07-02 DIAGNOSIS — Z951 Presence of aortocoronary bypass graft: Secondary | ICD-10-CM

## 2020-07-02 NOTE — Progress Notes (Signed)
Daily Session Note  Patient Details  Name: Mark Foster MRN: [id number] Date of Birth: Dec 17, 1955 Referring Provider:     CARDIAC REHAB PHASE II EXERCISE from 04/16/2020 in [place]  Referring Provider [name]      Encounter Date: 07/02/2020  Check In:  Session Check In - 07/02/20 1104      Check-In   Supervising physician immediately available to respond to emergencies [facility] MD immediately available    Physician(s) Dr. Harl Bowie    Location [facility]    Staff Present Aundra Dubin, RN, Bjorn Loser, MS, ACSM-CEP, Exercise Physiologist    Virtual Visit No    Medication changes reported     No    Fall or balance concerns reported    No    Tobacco Cessation No Change    Warm-up and Cool-down Performed as group-led instruction    Resistance Training Performed Yes    VAD Patient? No    PAD/SET Patient? No      PAD/SET Patient   Completed foot check today? No    Open wounds to report? No      Pain Assessment   Currently in Pain? No/denies    Pain Score 0-No pain    Multiple Pain Sites No           Capillary Blood Glucose: No results found for this or any previous visit (from the past 24 hour(s)).    Social History   Tobacco Use  Smoking Status Former Smoker  . Start date: 10/06/2019  Smokeless Tobacco Never Used    Goals Met:  Independence with exercise equipment Exercise tolerated well No report of cardiac concerns or symptoms Strength training completed today  Goals Unmet:  Not Applicable  Comments: checkout time is 1200   Dr. [name] is Medical Director for [facility] Pulmonary Rehab.

## 2020-07-05 ENCOUNTER — Encounter (HOSPITAL_COMMUNITY)
Admission: RE | Admit: 2020-07-05 | Discharge: 2020-07-05 | Disposition: A | Discharge status: Home / Self Care | Payer: Medicare HMO | Source: Ambulatory Visit | Attending: Internal Medicine | Admitting: Internal Medicine

## 2020-07-05 ENCOUNTER — Other Ambulatory Visit: Payer: Self-pay

## 2020-07-05 DIAGNOSIS — Z951 Presence of aortocoronary bypass graft: Secondary | ICD-10-CM | POA: Insufficient documentation

## 2020-07-05 NOTE — Progress Notes (Signed)
Daily Session Note  Patient Details  Name: Mark Foster MRN: [id number] Date of Birth: March 11, 1956 Referring Provider:     CARDIAC REHAB PHASE II EXERCISE from 04/16/2020 in [place]  Referring Provider [name]      Encounter Date: 07/05/2020  Check In:  Session Check In - 07/05/20 1113      Check-In   Supervising physician immediately available to respond to emergencies [facility] MD immediately available    Physician(s) Dr. Harl Bowie    Location [facility]    Staff Present Aundra Dubin, RN, Bjorn Loser, MS, ACSM-CEP, Exercise Physiologist    Virtual Visit No    Medication changes reported     No    Fall or balance concerns reported    No    Tobacco Cessation No Change    Warm-up and Cool-down Performed as group-led instruction    Resistance Training Performed Yes    VAD Patient? No    PAD/SET Patient? No      PAD/SET Patient   Completed foot check today? No    Open wounds to report? No      Pain Assessment   Currently in Pain? No/denies    Pain Score 0-No pain    Multiple Pain Sites No           Capillary Blood Glucose: No results found for this or any previous visit (from the past 24 hour(s)).    Social History   Tobacco Use  Smoking Status Former Smoker  . Start date: 10/06/2019  Smokeless Tobacco Never Used    Goals Met:  Independence with exercise equipment Exercise tolerated well No report of cardiac concerns or symptoms Strength training completed today  Goals Unmet:  Not Applicable  Comments: checkout time is 1200   Dr. [name] is Medical Director for [facility] Pulmonary Rehab.

## 2020-07-07 ENCOUNTER — Other Ambulatory Visit: Payer: Self-pay

## 2020-07-07 ENCOUNTER — Encounter (HOSPITAL_COMMUNITY)
Admission: RE | Admit: 2020-07-07 | Discharge: 2020-07-07 | Disposition: A | Discharge status: Home / Self Care | Payer: Medicare HMO | Source: Ambulatory Visit | Attending: Internal Medicine | Admitting: Internal Medicine

## 2020-07-07 DIAGNOSIS — Z951 Presence of aortocoronary bypass graft: Secondary | ICD-10-CM | POA: Diagnosis not present

## 2020-07-07 NOTE — Progress Notes (Signed)
Daily Session Note  Patient Details  Name: Mark Foster MRN: [id number] Date of Birth: 1956/04/06 Referring Provider:     CARDIAC REHAB PHASE II EXERCISE from 04/16/2020 in [place]  Referring Provider [name]      Encounter Date: 07/07/2020  Check In:  Session Check In - 07/07/20 1115      Check-In   Supervising physician immediately available to respond to emergencies [facility] MD immediately available    Physician(s) Dr. Domenic Polite    Location [facility]    Staff Present Aundra Dubin, RN, Bjorn Loser, MS, ACSM-CEP, Exercise Physiologist    Virtual Visit No    Medication changes reported     No    Fall or balance concerns reported    No    Tobacco Cessation No Change    Warm-up and Cool-down Performed as group-led instruction    Resistance Training Performed Yes    VAD Patient? No    PAD/SET Patient? No      PAD/SET Patient   Open wounds to report? No      Pain Assessment   Currently in Pain? No/denies    Pain Score 0-No pain    Multiple Pain Sites No           Capillary Blood Glucose: No results found for this or any previous visit (from the past 24 hour(s)).    Social History   Tobacco Use  Smoking Status Former Smoker  . Start date: 10/06/2019  Smokeless Tobacco Never Used    Goals Met:  Independence with exercise equipment Exercise tolerated well No report of cardiac concerns or symptoms Strength training completed today  Goals Unmet:  Not Applicable  Comments: checkout time is 1200   Dr. [name] is Medical Director for [facility] Pulmonary Rehab.

## 2020-07-09 ENCOUNTER — Encounter (HOSPITAL_COMMUNITY)
Admission: RE | Admit: 2020-07-09 | Discharge: 2020-07-09 | Disposition: A | Discharge status: Home / Self Care | Payer: Medicare HMO | Source: Ambulatory Visit | Attending: Internal Medicine | Admitting: Internal Medicine

## 2020-07-09 ENCOUNTER — Other Ambulatory Visit: Payer: Self-pay

## 2020-07-09 DIAGNOSIS — Z951 Presence of aortocoronary bypass graft: Secondary | ICD-10-CM | POA: Diagnosis not present

## 2020-07-09 NOTE — Progress Notes (Signed)
Daily Session Note  Patient Details  Name: Mark Foster MRN: [id number] Date of Birth: 1956-05-26 Referring Provider:     CARDIAC REHAB PHASE II EXERCISE from 04/16/2020 in [place]  Referring Provider [name]      Encounter Date: 07/09/2020  Check In:  Session Check In - 07/09/20 1112      Check-In   Supervising physician immediately available to respond to emergencies [facility] MD immediately available    Physician(s) Dr. Harl Bowie    Location [facility]    Staff Present Aundra Dubin, RN, Bjorn Loser, MS, ACSM-CEP, Exercise Physiologist    Virtual Visit No    Medication changes reported     No    Fall or balance concerns reported    No    Tobacco Cessation No Change    Warm-up and Cool-down Performed as group-led instruction    Resistance Training Performed Yes    VAD Patient? No    PAD/SET Patient? No      PAD/SET Patient   Completed foot check today? No    Open wounds to report? No      Pain Assessment   Currently in Pain? No/denies    Pain Score 0-No pain    Multiple Pain Sites No           Capillary Blood Glucose: No results found for this or any previous visit (from the past 24 hour(s)).    Social History   Tobacco Use  Smoking Status Former Smoker  . Start date: 10/06/2019  Smokeless Tobacco Never Used    Goals Met:  Independence with exercise equipment Exercise tolerated well No report of cardiac concerns or symptoms Strength training completed today  Goals Unmet:  Not Applicable  Comments: checkout time is 1200   Dr. [name] is Medical Director for [facility] Pulmonary Rehab.

## 2020-07-12 ENCOUNTER — Other Ambulatory Visit: Payer: Self-pay

## 2020-07-12 ENCOUNTER — Encounter (HOSPITAL_COMMUNITY)
Admission: RE | Admit: 2020-07-12 | Discharge: 2020-07-12 | Disposition: A | Discharge status: Home / Self Care | Payer: Medicare HMO | Source: Ambulatory Visit | Attending: Internal Medicine | Admitting: Internal Medicine

## 2020-07-12 VITALS — Ht 70.0 in | Wt 163.8 lb

## 2020-07-12 DIAGNOSIS — Z951 Presence of aortocoronary bypass graft: Secondary | ICD-10-CM | POA: Diagnosis not present

## 2020-07-12 NOTE — Progress Notes (Signed)
Discharge Progress Report  Patient Details  Name: Mark Foster MRN: [id number] Date of Birth: [birth year] Referring Provider:     CARDIAC REHAB PHASE II EXERCISE from 04/16/2020 in [place]  Referring Provider [name]       Number of Visits: 31  Reason for Discharge:  Patient reached a stable level of exercise. Patient independent in their exercise. Patient has met program and personal goals.  Smoking History:  Social History   Tobacco Use  Smoking Status Former Smoker  . Start date: 10/06/2019  Smokeless Tobacco Never Used    Diagnosis:  S/P CABG x 4  ADL UCSD:   Initial Exercise Prescription:  Initial Exercise Prescription - 04/16/20 1100      Date of Initial Exercise RX and Referring Provider   Date 04/16/20    Referring Provider [name]    Expected Discharge Date 07/17/20      Treadmill   MPH 1.7    Grade 0    Minutes 17    METs 2.3      NuStep   Level 2    SPM 80    Minutes 22    METs 2.5      Prescription Details   Frequency (times per week) 3    Duration Progress to 30 minutes of continuous aerobic without signs/symptoms of physical distress      Intensity   THRR 40-80% of Max Heartrate 62-124    Ratings of Perceived Exertion 11-13    Perceived Dyspnea 0-4      Progression   Progression Continue progressive overload as per policy without signs/symptoms or physical distress.      Resistance Training   Training Prescription Yes    Weight 3    Reps 10-15           Discharge Exercise Prescription (Final Exercise Prescription Changes):  Exercise Prescription Changes - 06/25/20 1453      Response to Exercise   Blood Pressure (Admit) 104/58    Blood Pressure (Exercise) 108/62    Blood Pressure (Exit) 108/62    Heart Rate (Admit) 79 bpm    Heart Rate (Exercise) 115 bpm    Heart Rate (Exit) 75 bpm    Rating of Perceived Exertion (Exercise) 12    Duration Continue with 30 min of aerobic exercise without  signs/symptoms of physical distress.    Intensity THRR unchanged      Progression   Progression Continue to progress workloads to maintain intensity without signs/symptoms of physical distress.      Resistance Training   Training Prescription Yes    Weight 3 lbs    Reps 10-15    Time 10 Minutes      Treadmill   MPH 3    Grade 1    Minutes 17    METs 6.7      NuStep   Level 5    SPM 158    Minutes 22    METs 2.8           Functional Capacity:  6 Minute Walk    Row Name 04/16/20 1109 07/12/20 1208       6 Minute Walk   Phase Initial Discharge    Distance 1300 feet 1750 feet    Distance % Change -- 34.62 %    Distance Feet Change -- 450 ft    Walk Time 6 minutes 6 minutes    # of Rest Breaks 0 0    MPH 2.46  3.31    METS 3.45 4.71    RPE 12.07 11    VO2 Peak 11 16.48    Symptoms No No    Resting HR 92 bpm 89 bpm    Resting BP 100/60 110/60    Resting Oxygen Saturation  98 % 100 %    Exercise Oxygen Saturation  during 6 min walk 100 % 99 %    Max Ex. HR 98 bpm 103 bpm    Max Ex. BP 108/62 138/68    2 Minute Post BP 98/64 108/64           Psychological, QOL, Others - Outcomes: PHQ 2/9: Depression screen [facility] 2/9 07/12/2020 04/16/2020 10/21/2019 09/30/2019  Decreased Interest 0 0 0 0  Down, Depressed, Hopeless 0 0 0 2  PHQ - 2 Score 0 0 0 2  Altered sleeping 0 1 - 3  Tired, decreased energy 0 0 - 2  Change in appetite 0 0 - 0  Feeling bad or failure about yourself  0 0 - 1  Trouble concentrating 0 0 - 0  Moving slowly or fidgety/restless 0 0 - 0  Suicidal thoughts 0 0 - 0  PHQ-9 Score 0 1 - 8  Difficult doing work/chores Not difficult at all Not difficult at all - Somewhat difficult  Some recent data might be hidden    Quality of Life:  Quality of Life - 07/12/20 1206      Quality of Life   Select Quality of Life      Quality of Life Scores   Health/Function Pre 23.3 %    Health/Function Post 28.8 %    Health/Function % Change 23.61 %     Socioeconomic Pre 27.43 %    Socioeconomic Post 28.43 %    Socioeconomic % Change  3.65 %    Psych/Spiritual Pre 29.14 %    Psych/Spiritual Post 29.14 %    Psych/Spiritual % Change 0 %    Family Pre 27.4 %    Family Post 26.4 %    Family % Change -3.65 %    GLOBAL Pre 25.96 %    GLOBAL Post 28.44 %    GLOBAL % Change 9.55 %           Personal Goals: Goals established at orientation with interventions provided to work toward goal.  Personal Goals and Risk Factors at Admission - 04/16/20 1026      Core Components/Risk Factors/Patient Goals on Admission    Weight Management Weight Maintenance    Diabetes Yes    Intervention Provide education about signs/symptoms and action to take for hypo/hyperglycemia.;Provide education about proper nutrition, including hydration, and aerobic/resistive exercise prescription along with prescribed medications to achieve blood glucose in normal ranges: Fasting glucose 65-99 mg/dL    Expected Outcomes Short Term: Participant verbalizes understanding of the signs/symptoms and immediate care of hyper/hypoglycemia, proper foot care and importance of medication, aerobic/resistive exercise and nutrition plan for blood glucose control.    Personal Goal Other Yes    Personal Goal Get stronger to do the things he want to do.    Intervention Patient will attend CR 3 days/week and supplement with exercise 2 days/week at home.    Expected Outcomes Patient will meet both program and personal goals.            Personal Goals Discharge:  Goals and Risk Factor Review    Row Name 05/03/20 1434 05/31/20 1543 06/25/20 1554 07/12/20 1329  Core Components/Risk Factors/Patient Goals Review   Personal Goals Review Diabetes;Other  Get stronger to do the things he wants to do. Diabetes;Other  Get stronger to do the things he wants to do. Diabetes Diabetes    Review Patient has completed 7 sessions losing 3 lbs since his initial visit. He is doing well in the  program with consistent attendance with progression. He says he is enjoying the program and is feeling better. He feels like he could be working harder with more resistance on the NuStep. We will continue to progress him as tolerated. He continues to be followed by oncology routinely for f/u pancreatic cancer. Will continue to monitor for progress. Patient has completed 18 sessions maintaining his weight since last 30 day review. He continues to do well in the program with progressiong and very consistent attendance reporting improved strength and stamina. He was seen in the ED 05/28/20 for hyperglycemia with glucose 551 mg/dl. He was elvated by cardiologist also that day. His A1C was 11.3% 05/28/20. He says he was later seen by his PCP with glucose readings 242 mg/dl. He blames hyperglyemic episode on drinking sweet tea at [facility]. His reported fasting glucose readings have been >120 mg/dl. No changes were made to his DM regime. His blood pressure is well controlled. Will continue to monitor for progress. Patient has completed 25 sessions gaining 9 lbs since last 30 day review. He has multiple risk factors for CAD. His personal goals to get stronger and to do the things he wants to do. His blood pressure continues to be WNL. His reproted glucose reading continue to be WNL with no new hgb A1C since the 11.3% 05/28/20. He continues to do well in the program with progression and consistent attendance. He recently moved and is glad to be getting settled into his new home. He says he does feel stronger and feels he is meeting his personal goals. Will continue to monitor for progress. Patient completed the program with 30 sessions gaining 8 lbs since he started the program. His exit measurments increased in grip strength. His exit walk test improved by 34.62%. He says he gained strength, stamina and improved his balance. He says he is walking 2 miles on the days he does not come to [facility] and is using free weights. His  medficts score increased. He met both personal and program goals. He plans to continue exercising by continuing his walking and free weights. CR will f/u with phone calls.    Expected Outcomes Patient will complete the program meeting both program and personal goals. Patient will complete the program meeting both program and personal goals. Patient will complete the program meeting both program and personal goals. Patient will continue exercising by walking and using free weights and continue to meet his goals.           Exercise Goals and Review:  Exercise Goals    Row Name 04/16/20 1114 05/03/20 1154 05/31/20 1319 06/28/20 1455       Exercise Goals   Increase Physical Activity Yes Yes Yes Yes    Intervention Provide advice, education, support and counseling about physical activity/exercise needs.;Develop an individualized exercise prescription for aerobic and resistive training based on initial evaluation findings, risk stratification, comorbidities and participant's personal goals. Provide advice, education, support and counseling about physical activity/exercise needs.;Develop an individualized exercise prescription for aerobic and resistive training based on initial evaluation findings, risk stratification, comorbidities and participant's personal goals. Provide advice, education, support and counseling about physical activity/exercise needs.;Develop  an individualized exercise prescription for aerobic and resistive training based on initial evaluation findings, risk stratification, comorbidities and participant's personal goals. Provide advice, education, support and counseling about physical activity/exercise needs.;Develop an individualized exercise prescription for aerobic and resistive training based on initial evaluation findings, risk stratification, comorbidities and participant's personal goals.    Expected Outcomes Short Term: Attend rehab on a regular basis to increase amount of  physical activity.;Long Term: Add in home exercise to make exercise part of routine and to increase amount of physical activity.;Long Term: Exercising regularly at least 3-5 days a week. Short Term: Attend rehab on a regular basis to increase amount of physical activity.;Long Term: Add in home exercise to make exercise part of routine and to increase amount of physical activity.;Long Term: Exercising regularly at least 3-5 days a week. Short Term: Attend rehab on a regular basis to increase amount of physical activity.;Long Term: Add in home exercise to make exercise part of routine and to increase amount of physical activity.;Long Term: Exercising regularly at least 3-5 days a week. Short Term: Attend rehab on a regular basis to increase amount of physical activity.;Long Term: Add in home exercise to make exercise part of routine and to increase amount of physical activity.;Long Term: Exercising regularly at least 3-5 days a week.    Increase Strength and Stamina Yes Yes Yes Yes    Intervention Provide advice, education, support and counseling about physical activity/exercise needs.;Develop an individualized exercise prescription for aerobic and resistive training based on initial evaluation findings, risk stratification, comorbidities and participant's personal goals. Provide advice, education, support and counseling about physical activity/exercise needs.;Develop an individualized exercise prescription for aerobic and resistive training based on initial evaluation findings, risk stratification, comorbidities and participant's personal goals. Provide advice, education, support and counseling about physical activity/exercise needs.;Develop an individualized exercise prescription for aerobic and resistive training based on initial evaluation findings, risk stratification, comorbidities and participant's personal goals. Provide advice, education, support and counseling about physical activity/exercise  needs.;Develop an individualized exercise prescription for aerobic and resistive training based on initial evaluation findings, risk stratification, comorbidities and participant's personal goals.    Expected Outcomes Short Term: Increase workloads from initial exercise prescription for resistance, speed, and METs.;Short Term: Perform resistance training exercises routinely during rehab and add in resistance training at home;Long Term: Improve cardiorespiratory fitness, muscular endurance and strength as measured by increased METs and functional capacity (6MWT) Short Term: Increase workloads from initial exercise prescription for resistance, speed, and METs.;Short Term: Perform resistance training exercises routinely during rehab and add in resistance training at home;Long Term: Improve cardiorespiratory fitness, muscular endurance and strength as measured by increased METs and functional capacity (6MWT) Short Term: Increase workloads from initial exercise prescription for resistance, speed, and METs.;Short Term: Perform resistance training exercises routinely during rehab and add in resistance training at home;Long Term: Improve cardiorespiratory fitness, muscular endurance and strength as measured by increased METs and functional capacity (6MWT) Short Term: Increase workloads from initial exercise prescription for resistance, speed, and METs.;Short Term: Perform resistance training exercises routinely during rehab and add in resistance training at home;Long Term: Improve cardiorespiratory fitness, muscular endurance and strength as measured by increased METs and functional capacity (6MWT)    Able to understand and use rate of perceived exertion (RPE) scale Yes Yes Yes Yes    Intervention Provide education and explanation on how to use RPE scale Provide education and explanation on how to use RPE scale Provide education and explanation on how to use RPE scale Provide  education and explanation on how to use RPE  scale    Expected Outcomes Short Term: Able to use RPE daily in rehab to express subjective intensity level;Long Term:  Able to use RPE to guide intensity level when exercising independently Short Term: Able to use RPE daily in rehab to express subjective intensity level;Long Term:  Able to use RPE to guide intensity level when exercising independently Short Term: Able to use RPE daily in rehab to express subjective intensity level;Long Term:  Able to use RPE to guide intensity level when exercising independently Short Term: Able to use RPE daily in rehab to express subjective intensity level;Long Term:  Able to use RPE to guide intensity level when exercising independently    Knowledge and understanding of Target Heart Rate Range (THRR) Yes Yes Yes Yes    Intervention Provide education and explanation of THRR including how the numbers were predicted and where they are located for reference Provide education and explanation of THRR including how the numbers were predicted and where they are located for reference Provide education and explanation of THRR including how the numbers were predicted and where they are located for reference Provide education and explanation of THRR including how the numbers were predicted and where they are located for reference    Expected Outcomes Short Term: Able to state/look up THRR;Short Term: Able to use daily as guideline for intensity in rehab;Long Term: Able to use THRR to govern intensity when exercising independently Short Term: Able to state/look up THRR;Short Term: Able to use daily as guideline for intensity in rehab;Long Term: Able to use THRR to govern intensity when exercising independently Short Term: Able to state/look up THRR;Short Term: Able to use daily as guideline for intensity in rehab;Long Term: Able to use THRR to govern intensity when exercising independently Short Term: Able to state/look up THRR;Short Term: Able to use daily as guideline for intensity in  rehab;Long Term: Able to use THRR to govern intensity when exercising independently    Able to check pulse independently Yes Yes Yes Yes    Intervention Provide education and demonstration on how to check pulse in carotid and radial arteries.;Review the importance of being able to check your own pulse for safety during independent exercise Provide education and demonstration on how to check pulse in carotid and radial arteries.;Review the importance of being able to check your own pulse for safety during independent exercise Provide education and demonstration on how to check pulse in carotid and radial arteries.;Review the importance of being able to check your own pulse for safety during independent exercise Provide education and demonstration on how to check pulse in carotid and radial arteries.;Review the importance of being able to check your own pulse for safety during independent exercise    Expected Outcomes Short Term: Able to explain why pulse checking is important during independent exercise;Long Term: Able to check pulse independently and accurately Short Term: Able to explain why pulse checking is important during independent exercise;Long Term: Able to check pulse independently and accurately Short Term: Able to explain why pulse checking is important during independent exercise;Long Term: Able to check pulse independently and accurately Short Term: Able to explain why pulse checking is important during independent exercise;Long Term: Able to check pulse independently and accurately    Understanding of Exercise Prescription Yes Yes Yes Yes    Intervention Provide education, explanation, and written materials on patient's individual exercise prescription Provide education, explanation, and written materials on patient's individual exercise prescription Provide  education, explanation, and written materials on patient's individual exercise prescription Provide education, explanation, and written  materials on patient's individual exercise prescription    Expected Outcomes Short Term: Able to explain program exercise prescription;Long Term: Able to explain home exercise prescription to exercise independently Short Term: Able to explain program exercise prescription;Long Term: Able to explain home exercise prescription to exercise independently Short Term: Able to explain program exercise prescription;Long Term: Able to explain home exercise prescription to exercise independently Short Term: Able to explain program exercise prescription;Long Term: Able to explain home exercise prescription to exercise independently           Exercise Goals Re-Evaluation:  Exercise Goals Re-Evaluation    Row Name 05/03/20 1154 05/31/20 1319 06/28/20 1455         Exercise Goal Re-Evaluation   Exercise Goals Review Increase Physical Activity;Increase Strength and Stamina;Able to understand and use rate of perceived exertion (RPE) scale;Knowledge and understanding of Target Heart Rate Range (THRR);Able to check pulse independently;Understanding of Exercise Prescription Increase Physical Activity;Increase Strength and Stamina;Able to understand and use rate of perceived exertion (RPE) scale;Knowledge and understanding of Target Heart Rate Range (THRR);Able to check pulse independently;Understanding of Exercise Prescription Increase Physical Activity;Increase Strength and Stamina;Able to understand and use rate of perceived exertion (RPE) scale;Knowledge and understanding of Target Heart Rate Range (THRR);Able to check pulse independently;Understanding of Exercise Prescription     Comments Pt has attended 6 exercise sessions. He is motivated and is eager for workload increases. He currently exercises at 2.0 METs on the stepper. Will continue to monitor and progress as able. Pt has attended 17 exercise sessions. He continues to stay motivated and continues to increase his workloads. He currently exercises at 1.8 METs  on the stepper. Will continue to monitor and progress as able. Pt has attended 24 exercise sessions. He continues to stay motivated and increase his workloads. He currently exercises at 2.8 METs on the stepper. He will be graduating the program in about 2 weeks, so I will need to discuss with him his plans for continuing exercise. Will continue to monitor and progress as able.     Expected Outcomes Through exercise at rehab and beginning a home exercise program, the patient will reach their stated goals. Through exercise at rehab and by engaging in a home exercise plan, the patient will be able to reach their goals. Through exercise at rehab and by engaging in a home exercise plan, the patient will be able to reach their goals.            Nutrition & Weight - Outcomes:  Pre Biometrics - 04/16/20 1115      Pre Biometrics   Height _0  (1.778 m)    Weight 71.2 kg    Waist Circumference 34.5 inches    Hip Circumference 38.5 inches    Waist to Hip Ratio 0.9 %    BMI (Calculated) 22.51    Triceps Skinfold 16 mm    % Body Fat 23.5 %    Grip Strength 34.6 kg    Flexibility 0 in    Single Leg Stand 9.3 seconds           Post Biometrics - 07/12/20 1210       Post  Biometrics   Height _1  (1.778 m)    Weight 74.3 kg    Waist Circumference 33 inches    Hip Circumference 38 inches    Waist to Hip Ratio 0.87 %    BMI (Calculated)  23.5    Triceps Skinfold 11 mm    % Body Fat 21.2 %    Grip Strength 35.2 kg    Flexibility 0 in    Single Leg Stand 26.96 seconds           Nutrition:  Nutrition Therapy & Goals - 06/25/20 1552      Personal Nutrition Goals   Comments Patient continues to follow a diabetic diet and says he also trys to eat heart healthy. He is trying to make healthier choices. We continue to provide education through hand-outs regarding making healthier diet choices. Will continue to monitor.      Intervention Plan   Intervention Nutrition handout(s) given to  patient.           Nutrition Discharge:  Nutrition Assessments - 07/12/20 1207      MEDFICTS Scores   Pre Score 6    Post Score 17    Score Difference 11           Education Questionnaire Score:  Knowledge Questionnaire Score - 07/12/20 1207      Knowledge Questionnaire Score   Post Score 19/24          Patient graduated from [place] today on 07/12/20 after completing 31 sessions. They achieved LTG of 30 minutes of aerobic exercise at Max Met level of 3.71  All patients vitals are WNL. Discharge instruction has been reviewed in detail and patient stated an understanding of material given. Patient plans to exercise at home by walking and using free weights. Cardiac Rehab staff will make f/u call. Patient had no complaints of any abnormal S/S or pain on their exit visit.   Goals reviewed with patient; copy given to patient.

## 2020-07-12 NOTE — Progress Notes (Signed)
Daily Session Note  Patient Details  Name: Mark Foster MRN: [id number] Date of Birth: 1955-12-23 Referring Provider:     CARDIAC REHAB PHASE II EXERCISE from 04/16/2020 in [place]  Referring Provider [name]      Encounter Date: 07/12/2020  Check In:  Session Check In - 07/12/20 1130      Check-In   Supervising physician immediately available to respond to emergencies [facility] MD immediately available    Physician(s) Dr. Domenic Polite    Location [facility]    Staff Present Aundra Dubin, RN, Bjorn Loser, MS, ACSM-CEP, Exercise Physiologist    Virtual Visit No    Medication changes reported     No    Fall or balance concerns reported    No    Tobacco Cessation No Change    Warm-up and Cool-down Performed as group-led instruction    Resistance Training Performed Yes    VAD Patient? No    PAD/SET Patient? No      PAD/SET Patient   Completed foot check today? No    Open wounds to report? No      Pain Assessment   Currently in Pain? No/denies    Pain Score 0-No pain    Multiple Pain Sites No           Capillary Blood Glucose: No results found for this or any previous visit (from the past 24 hour(s)).    Social History   Tobacco Use  Smoking Status Former Smoker  . Start date: 10/06/2019  Smokeless Tobacco Never Used    Goals Met:  Independence with exercise equipment Exercise tolerated well No report of cardiac concerns or symptoms Strength training completed today  Goals Unmet:  Not Applicable  Comments: checkout time is 1200   Dr. [name] is Medical Director for [facility].

## 2020-07-21 DIAGNOSIS — G40009 Localization-related (focal) (partial) idiopathic epilepsy and epileptic syndromes with seizures of localized onset, not intractable, without status epilepticus: Secondary | ICD-10-CM | POA: Diagnosis not present

## 2020-07-21 DIAGNOSIS — K219 Gastro-esophageal reflux disease without esophagitis: Secondary | ICD-10-CM | POA: Diagnosis not present

## 2020-07-21 DIAGNOSIS — M5451 Vertebrogenic low back pain: Secondary | ICD-10-CM | POA: Diagnosis not present

## 2020-07-21 DIAGNOSIS — E1140 Type 2 diabetes mellitus with diabetic neuropathy, unspecified: Secondary | ICD-10-CM | POA: Diagnosis not present

## 2020-07-21 DIAGNOSIS — Z8507 Personal history of malignant neoplasm of pancreas: Secondary | ICD-10-CM | POA: Diagnosis not present

## 2020-07-21 DIAGNOSIS — N400 Enlarged prostate without lower urinary tract symptoms: Secondary | ICD-10-CM | POA: Diagnosis not present

## 2020-07-21 DIAGNOSIS — K5792 Diverticulitis of intestine, part unspecified, without perforation or abscess without bleeding: Secondary | ICD-10-CM | POA: Diagnosis not present

## 2020-07-21 DIAGNOSIS — Z7689 Persons encountering health services in other specified circumstances: Secondary | ICD-10-CM | POA: Diagnosis not present

## 2020-07-21 DIAGNOSIS — I10 Essential (primary) hypertension: Secondary | ICD-10-CM | POA: Diagnosis not present

## 2020-07-26 ENCOUNTER — Other Ambulatory Visit: Payer: Self-pay | Admitting: *Deleted

## 2020-07-26 MED ORDER — EZETIMIBE 10 MG PO TABS: 10.0000 mg | ORAL_TABLET | Freq: Every day | ORAL | 1 refills | Status: DC

## 2020-07-28 ENCOUNTER — Other Ambulatory Visit (HOSPITAL_COMMUNITY): Payer: Self-pay

## 2020-07-28 DIAGNOSIS — C259 Malignant neoplasm of pancreas, unspecified: Secondary | ICD-10-CM

## 2020-07-28 NOTE — Progress Notes (Signed)
Received a verbal order from Dr. Allyn Kenner to draw A 1 C, PSA and TSH with next labs and fax to his office.  Orders have been entered and linked.

## 2020-08-12 ENCOUNTER — Inpatient Hospital Stay (HOSPITAL_COMMUNITY): Payer: Medicare HMO | Attending: Hematology and Oncology

## 2020-08-12 ENCOUNTER — Other Ambulatory Visit: Payer: Self-pay

## 2020-08-12 ENCOUNTER — Ambulatory Visit (HOSPITAL_COMMUNITY)
Admission: RE | Admit: 2020-08-12 | Discharge: 2020-08-12 | Disposition: A | Discharge status: Home / Self Care | Payer: Medicare HMO | Source: Ambulatory Visit | Attending: Hematology | Admitting: Hematology

## 2020-08-12 DIAGNOSIS — M47816 Spondylosis without myelopathy or radiculopathy, lumbar region: Secondary | ICD-10-CM | POA: Diagnosis not present

## 2020-08-12 DIAGNOSIS — Z9081 Acquired absence of spleen: Secondary | ICD-10-CM | POA: Diagnosis not present

## 2020-08-12 DIAGNOSIS — R5383 Other fatigue: Secondary | ICD-10-CM | POA: Diagnosis not present

## 2020-08-12 DIAGNOSIS — I2510 Atherosclerotic heart disease of native coronary artery without angina pectoris: Secondary | ICD-10-CM | POA: Insufficient documentation

## 2020-08-12 DIAGNOSIS — Z79899 Other long term (current) drug therapy: Secondary | ICD-10-CM | POA: Diagnosis not present

## 2020-08-12 DIAGNOSIS — C259 Malignant neoplasm of pancreas, unspecified: Secondary | ICD-10-CM | POA: Diagnosis not present

## 2020-08-12 DIAGNOSIS — K439 Ventral hernia without obstruction or gangrene: Secondary | ICD-10-CM | POA: Diagnosis not present

## 2020-08-12 DIAGNOSIS — Z7901 Long term (current) use of anticoagulants: Secondary | ICD-10-CM | POA: Diagnosis not present

## 2020-08-12 DIAGNOSIS — N400 Enlarged prostate without lower urinary tract symptoms: Secondary | ICD-10-CM | POA: Diagnosis not present

## 2020-08-12 DIAGNOSIS — E1140 Type 2 diabetes mellitus with diabetic neuropathy, unspecified: Secondary | ICD-10-CM | POA: Insufficient documentation

## 2020-08-12 DIAGNOSIS — Z86718 Personal history of other venous thrombosis and embolism: Secondary | ICD-10-CM | POA: Diagnosis not present

## 2020-08-12 DIAGNOSIS — C251 Malignant neoplasm of body of pancreas: Secondary | ICD-10-CM | POA: Diagnosis not present

## 2020-08-12 DIAGNOSIS — R252 Cramp and spasm: Secondary | ICD-10-CM | POA: Diagnosis not present

## 2020-08-12 DIAGNOSIS — Z8673 Personal history of transient ischemic attack (TIA), and cerebral infarction without residual deficits: Secondary | ICD-10-CM | POA: Diagnosis not present

## 2020-08-12 DIAGNOSIS — Z90411 Acquired partial absence of pancreas: Secondary | ICD-10-CM | POA: Diagnosis not present

## 2020-08-12 DIAGNOSIS — K862 Cyst of pancreas: Secondary | ICD-10-CM | POA: Diagnosis not present

## 2020-08-12 DIAGNOSIS — Z87891 Personal history of nicotine dependence: Secondary | ICD-10-CM | POA: Diagnosis not present

## 2020-08-12 LAB — CBC WITH DIFFERENTIAL/PLATELET
Abs Immature Granulocytes: 0 10*3/uL (ref 0.00–0.07)
Basophils Absolute: 0 10*3/uL (ref 0.0–0.1)
Basophils Relative: 1 %
Eosinophils Absolute: 0.1 10*3/uL (ref 0.0–0.5)
Eosinophils Relative: 2 %
HCT: 44.3 % (ref 39.0–52.0)
Hemoglobin: 14.8 g/dL (ref 13.0–17.0)
Immature Granulocytes: 0 %
Lymphocytes Relative: 48 %
Lymphs Abs: 2 10*3/uL (ref 0.7–4.0)
MCH: 32.7 pg (ref 26.0–34.0)
MCHC: 33.4 g/dL (ref 30.0–36.0)
MCV: 98 fL (ref 80.0–100.0)
Monocytes Absolute: 0.5 10*3/uL (ref 0.1–1.0)
Monocytes Relative: 12 %
Neutro Abs: 1.5 10*3/uL — ABNORMAL LOW (ref 1.7–7.7)
Neutrophils Relative %: 37 %
Platelets: 222 10*3/uL (ref 150–400)
RBC: 4.52 MIL/uL (ref 4.22–5.81)
RDW: 15.5 % (ref 11.5–15.5)
WBC: 4.1 10*3/uL (ref 4.0–10.5)
nRBC: 0 % (ref 0.0–0.2)

## 2020-08-12 LAB — HEMOGLOBIN A1C
Hgb A1c MFr Bld: 8.9 % — ABNORMAL HIGH (ref 4.8–5.6)
Mean Plasma Glucose: 208.73 mg/dL

## 2020-08-12 LAB — TSH: TSH: 1.661 u[IU]/mL (ref 0.350–4.500)

## 2020-08-12 LAB — COMPREHENSIVE METABOLIC PANEL
ALT: 25 U/L (ref 0–44)
AST: 23 U/L (ref 15–41)
Albumin: 4.1 g/dL (ref 3.5–5.0)
Alkaline Phosphatase: 64 U/L (ref 38–126)
Anion gap: 5 (ref 5–15)
BUN: 20 mg/dL (ref 8–23)
CO2: 27 mmol/L (ref 22–32)
Calcium: 9 mg/dL (ref 8.9–10.3)
Chloride: 102 mmol/L (ref 98–111)
Creatinine, Ser: 0.69 mg/dL (ref 0.61–1.24)
GFR, Estimated: >60 mL/min (ref >60)
Glucose, Bld: 232 mg/dL — ABNORMAL HIGH (ref 70–99)
Potassium: 5.1 mmol/L (ref 3.5–5.1)
Sodium: 134 mmol/L — ABNORMAL LOW (ref 135–145)
Total Bilirubin: 1.1 mg/dL (ref 0.3–1.2)
Total Protein: 7.1 g/dL (ref 6.5–8.1)

## 2020-08-12 LAB — PSA: Prostatic Specific Antigen: 1.01 ng/mL (ref 0.00–4.00)

## 2020-08-12 MED ORDER — IOHEXOL 300 MG/ML  SOLN
100.0000 mL | Freq: Once | INTRAMUSCULAR | Status: AC | PRN
  Administered 2020-08-12: 100 mL via INTRAVENOUS

## 2020-08-13 LAB — CANCER ANTIGEN 19-9: CA 19-9: 13 U/mL (ref 0–35)

## 2020-08-14 ENCOUNTER — Other Ambulatory Visit (INDEPENDENT_AMBULATORY_CARE_PROVIDER_SITE_OTHER): Payer: Self-pay | Admitting: Internal Medicine

## 2020-08-14 ENCOUNTER — Other Ambulatory Visit: Payer: Self-pay | Admitting: Student

## 2020-08-14 ENCOUNTER — Other Ambulatory Visit: Payer: Self-pay | Admitting: "Endocrinology

## 2020-08-15 NOTE — Progress Notes (Signed)
Mark Foster, Mark Foster [id number]   CLINIC:  Medical Oncology/Hematology  PCP:  Ailene Ards, NP [place] / [place] [address] [phone]   REASON FOR VISIT:  Follow-up for pancreatic adenocarcinoma  PRIOR THERAPY:  1. Distal pancreatectomy and splenectomy at [facility] on 11/20/2018. 2. Adjuvant FOLFIRINOX x 12 cycles from 12/30/2018 to 06/04/2019.  NGS Results: BRCA1 heterozygous VUS  CURRENT THERAPY: Observation  BRIEF ONCOLOGIC HISTORY:  Oncology History  Pancreatic adenocarcinoma ([place])  08/20/2018 Imaging   CT abdomen/pelvis w/ contrast: IMPRESSION: Atrophy and ductal dilatation involving the pancreatic tail, with suspected small soft tissue mass in the pancreatic body which could represent pancreatic carcinoma. Abdomen MRI and MRCP without and with contrast is recommended for further evaluation.  No evidence of hepatobiliary disease.   08/30/2018 Imaging   MRI abdomen w/ contrast: IMPRESSION: 1. Although not definitive, there remains concern of a small hypoenhancing mass at the junction of the pancreatic body and tail associated with atrophy and ductal dilatation in the pancreatic tail. This remains concerning for pancreatic neoplasm. Postinflammatory stricture less likely. Besides a tiny cystic lesion in the pancreatic tail, there are no other signs of previous pancreatitis. Further evaluation with endoscopic ultrasound for possible biopsy strongly recommended. 2. No evidence of metastatic disease. 3. Cholelithiasis without evidence of cholecystitis or biliary dilatation.   09/03/2018 Initial Diagnosis   Pancreatic adenocarcinoma ([place])   10/03/2018 Procedure   EUS: - 2.6cm irregularly shaped mass in the body of the pancreas that causing main pancreatic duct obstruction and dilation. The mass abuts the splenic vessels but no other significant vascular structures and it was sampled with trangastric EUS FNA.  Preliminary cytology is + for malignancy, likely well-differentiated adenocarcinoma. It appears surgically resectable.   10/03/2018 Pathology Results   Accession: [id number]  FINE NEEDLE ASPIRATION, ENDOSCOPIC, PANCREAS BODY (SPECIMEN 1 OF 1 COLLECTED 10/03/18): ADENOCARCINOMA.   10/17/2018 Imaging   PET: IMPRESSION: 1. Tiny focus of hypermetabolism identified in the body of pancreas, adjacent to the abrupt cut off of the main pancreatic duct. No evidence for hypermetabolic metastatic disease in the neck, chest, abdomen, or pelvis. 2. Cholelithiasis. 3.  Aortic Atherosclerois (ICD10-170.0) 4. 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  The Hereditary Gene Panel offered by [facility] includes sequencing and/or deletion duplication testing of the following 47 genes: APC, ATM, AXIN2, BARD1, BMPR1A, BRCA1, BRCA2, BRIP1, CDH1, CDK4, CDKN2A (p14ARF), CDKN2A (p16INK4a), CHEK2, CTNNA1, DICER1, EPCAM (Deletion/duplication testing only), GREM1 (promoter region deletion/duplication testing only), KIT, MEN1, MLH1, MSH2, MSH3, MSH6, MUTYH, NBN, NF1, NHTL1, PALB2, PDGFRA, PMS2, POLD1, POLE, PTEN, RAD50, RAD51C, RAD51D, SDHB, SDHC, SDHD, SMAD4, SMARCA4. STK11, TP53, TSC1, TSC2, and VHL.  The following genes were evaluated for sequence changes only: SDHA and HOXB13 c.251G>A variant only. The report date is November 12, 2018.  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 The change in variant classification was made as a result of re-review of the evidence in light of new variant interpretation guidelines and/or new information. The amended report date is March 11, 2020.    11/20/2018 Surgery   Distal subtotal pancreatectomy with splenectomy at [facility]   11/20/2018 Surgery   TUMOR   Tumor Site:  Pancreatic body    Histologic Type:  Ductal adenocarcinoma    Histologic Grade:   G1: Well differentiated    Tumor Size:  Greatest dimension in  Centimeters (cm): 2.8 Centimeters (cm)    Additional Dimension in Centimeters (cm):  2.7 Centimeters (cm)    Additional Dimension in Centimeters (cm):  1.8 Centimeters (cm)   Tumor Extent:      Tumor Extension:  Tumor is confined to pancreas    Accessory Findings:      Treatment Effect:  No known presurgical therapy     Lymphovascular Invasion:  Not identified     Perineural Invasion:  Not identified   MARGINS   Margins:      Proximal Pancreatic Parenchymal Margin:  Uninvolved by invasive carcinoma and pancreatic high-grade intraepithelial neoplasia      Distance of Invasive Carcinoma from Margin:  0.6 Centimeters (cm)   :      Other Margin:  Splenic margin.      Margin Status:  Uninvolved by invasive carcinoma   LYMPH NODES  Number of Lymph Nodes Involved:  2   Number of Lymph Nodes Examined:  16   PATHOLOGIC STAGE CLASSIFICATION (pTNM, AJCC 8th Edition)  TNM Descriptors:  Not applicable   Primary Tumor (pT):  pT2   Regional Lymph Nodes (pN):  pN1   ADDITIONAL FINDINGS  Additional Pathologic Findings:  Pancreatic intraepithelial neoplasia    Highest Grade (PanIN):  High grade PanIN is present.   Additional Pathologic Findings:  Benign unilocular pancreatic cyst (1.3 cm in greatest dimension) at the pancreatic tail.   12/30/2018 -  Chemotherapy   The patient had palonosetron (ALOXI) injection 0.25 mg, 0.25 mg, Intravenous,  Once, 12 of 12 cycles Administration: 0.25 mg (12/30/2018), 0.25 mg (01/13/2019), 0.25 mg (01/28/2019), 0.25 mg (02/11/2019), 0.25 mg (02/25/2019), 0.25 mg (03/11/2019), 0.25 mg (03/25/2019), 0.25 mg (04/09/2019), 0.25 mg (04/23/2019), 0.25 mg (05/07/2019), 0.25 mg (05/21/2019), 0.25 mg (06/04/2019) pegfilgrastim (NEULASTA) injection 6 mg, 6 mg, Subcutaneous, Once, 7 of 7 cycles Administration: 6 mg (01/01/2019), 6 mg (01/15/2019), 6 mg  (01/30/2019), 6 mg (02/13/2019), 6 mg (02/27/2019), 6 mg (03/13/2019), 6 mg (03/27/2019) pegfilgrastim-jmdb (FULPHILA) injection 6 mg, 6 mg, Subcutaneous,  Once, 5 of 5 cycles Administration: 6 mg (04/11/2019), 6 mg (04/25/2019), 6 mg (05/09/2019), 6 mg (05/23/2019), 6 mg (06/06/2019) irinotecan (CAMPTOSAR) 280 mg in sodium chloride 0.9 % 500 mL chemo infusion, 150 mg/m2 = 280 mg (100 % of original dose 150 mg/m2), Intravenous,  Once, 12 of 12 cycles Dose modification: 150 mg/m2 (original dose 150 mg/m2, Cycle 1, Reason: Provider Judgment) Administration: 280 mg (12/30/2018), 280 mg (01/13/2019), 280 mg (01/28/2019), 280 mg (02/11/2019), 280 mg (02/25/2019), 280 mg (03/11/2019), 280 mg (03/25/2019), 280 mg (04/09/2019), 280 mg (04/23/2019), 280 mg (05/07/2019), 280 mg (05/21/2019), 280 mg (06/04/2019) leucovorin 700 mg in sodium chloride 0.9 % 250 mL infusion, 744 mg, Intravenous,  Once, 12 of 12 cycles Administration: 700 mg (12/30/2018), 700 mg (01/13/2019), 700 mg (01/28/2019), 700 mg (02/11/2019), 700 mg (02/25/2019), 700 mg (03/11/2019), 700 mg (03/25/2019), 700 mg (04/09/2019), 700 mg (04/23/2019), 700 mg (05/07/2019), 700 mg (05/21/2019), 700 mg (06/04/2019) oxaliplatin (ELOXATIN) 120 mg in dextrose 5 % 500 mL chemo infusion, 63.75 mg/m2 = 120 mg (75 % of original dose 85 mg/m2), Intravenous,  Once, 12 of 12 cycles Dose modification: 63.75 mg/m2 (75 % of original dose 85 mg/m2, Cycle 1, Reason: Other (see comments), Comment: high risk for worsening neuropathy), 42.5 mg/m2 (50 % of original dose 85 mg/m2, Cycle 11, Reason: Other (see comments), Comment: neuropathy) Administration: 120 mg (12/30/2018), 120 mg (01/13/2019), 120 mg (01/28/2019), 120 mg (02/11/2019), 120 mg (02/25/2019), 120 mg (03/11/2019), 120 mg (03/25/2019),  120 mg (04/09/2019), 120 mg (04/23/2019), 120 mg (05/07/2019), 80 mg (05/21/2019), 80 mg (06/04/2019) fosaprepitant (EMEND) 150 mg, dexamethasone (DECADRON) 12 mg in sodium chloride 0.9 % 145 mL IVPB, , Intravenous,  Once, 12 of 12  cycles Administration:  (12/30/2018),  (01/13/2019),  (01/28/2019),  (02/11/2019),  (02/25/2019),  (03/11/2019),  (03/25/2019),  (04/09/2019),  (04/23/2019),  (05/07/2019),  (05/21/2019),  (06/04/2019) fluorouracil (ADRUCIL) 4,450 mg in sodium chloride 0.9 % 61 mL chemo infusion, 2,400 mg/m2 = 4,450 mg, Intravenous, 1 Day/Dose, 12 of 12 cycles Administration: 4,450 mg (12/30/2018), 4,450 mg (01/13/2019), 4,450 mg (01/28/2019), 4,450 mg (02/11/2019), 4,450 mg (02/25/2019), 4,450 mg (03/11/2019), 4,450 mg (03/25/2019), 4,450 mg (04/09/2019), 4,450 mg (04/23/2019), 4,450 mg (05/07/2019), 4,450 mg (05/21/2019), 4,450 mg (06/04/2019)  for chemotherapy treatment.      CANCER STAGING: Cancer Staging No matching staging information was found for the patient.  INTERVAL HISTORY:   Mark Foster, a 65 y.o. male, returns for routine follow-up of his pancreatic adenocarcinoma.  He is doing well, gaining weight. No abdominal pain or back pain. No change in bowel habits, breathing. Complains of cramps in left wrist, he plays guitar. No chest pain or chest pressure.   REVIEW OF SYSTEMS:  Review of Systems  Constitutional: Positive for fatigue (mild). Negative for appetite change.  HENT:   Negative for nosebleeds.   Gastrointestinal: Negative for abdominal pain and blood in stool.  Genitourinary: Negative for hematuria.   All other systems reviewed and are negative.   PAST MEDICAL/SURGICAL HISTORY:  Past Medical History:  Diagnosis Date  . CAD (coronary artery disease)    a. 01/2020: CABG x4 with LIMA-LAD, SVG-OM, SVG-PDA and SVG-D1  . Carotid artery obstruction, left    Left ICA occlusion  . Cervical compression fracture ([place])   . Cervical spinal stenosis   . Coronary artery disease   . Diabetic neuropathy ([facility])    Bilateral legs  . Diverticulitis   . Family history of pancreatic cancer   . History of kidney stones   . Hypothyroidism   . Pancreatic cancer ([place])   . Stenosis of left vertebral artery   . 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 Stroke [facility])    2011  . Type 2 diabetes mellitus ([place])    Past Surgical History:  Procedure Laterality Date  . APPENDECTOMY    . BUBBLE STUDY N/A 11/18/2019   Procedure: BUBBLE STUDY;  Surgeon: Herminio Commons, MD;  Location: [facility];  Service: Cardiology;  Laterality: N/A;  . CARDIAC SURGERY    . COLON RESECTION     For diverticulitis  . COLON SURGERY    . CORONARY ARTERY BYPASS GRAFT N/A 01/26/2020   Procedure: CORONARY ARTERY BYPASS GRAFTING (CABG) TIMES FOUR USING LEFT INTERNAL MAMMARY VEIN AND RIGHT GREATER SAPHENOUS VEIN;  Surgeon: Melrose Nakayama, MD;  Location: [place];  Service: Open Heart Surgery;  Laterality: N/A;  . ENDOVEIN HARVEST OF GREATER SAPHENOUS VEIN Right 01/26/2020   Procedure: [place] Of Greater Saphenous Vein;  Surgeon: Melrose Nakayama, MD;  Location: [place];  Service: Open Heart Surgery;  Laterality: Right;  . ESOPHAGOGASTRODUODENOSCOPY N/A 10/03/2018   Procedure: ESOPHAGOGASTRODUODENOSCOPY (EGD);  Surgeon: [name], MD;  Location: [name];  Service: Endoscopy;  Laterality: N/A;  . EUS N/A 10/03/2018   Procedure: UPPER ENDOSCOPIC ULTRASOUND (EUS) RADIAL;  Surgeon: [name], MD;  Location: [facility];  Service: Endoscopy;  Laterality: N/A;  . EYE SURGERY Bilateral   . FINE NEEDLE ASPIRATION N/A  10/03/2018   Procedure: FINE NEEDLE ASPIRATION (FNA) LINEAR;  Surgeon: [name], MD;  Location: [facility];  Service: Endoscopy;  Laterality: N/A;  . IR ANGIO INTRA EXTRACRAN SEL COM CAROTID INNOMINATE BILAT MOD SED  01/21/2018  . IR ANGIO INTRA EXTRACRAN SEL COM CAROTID INNOMINATE UNI R MOD SED  03/21/2018  . IR ANGIO VERTEBRAL SEL VERTEBRAL BILAT MOD SED  01/21/2018  . IR TRANSCATH EXCRAN VERT OR CAR A STENT  03/21/2018  . LEFT HEART CATH AND CORONARY ANGIOGRAPHY N/A 01/08/2020   Procedure: LEFT HEART CATH AND CORONARY ANGIOGRAPHY;  Surgeon: Leonie Man, MD;  Location: [facility] [facility];   Service: Cardiovascular;  Laterality: N/A;  . PORTACATH PLACEMENT Left 12/23/2018   Procedure: INSERTION PORT-A-CATH;  Surgeon: Virl Cagey, MD;  Location: [facility];  Service: General;  Laterality: Left;  . RADIOLOGY WITH ANESTHESIA N/A 03/21/2018   Procedure: IR WITH ANESTHESIA WITH STENT PLACEMENT;  Surgeon: Luanne Bras, MD;  Location: [facility];  Service: Radiology;  Laterality: N/A;  . ROTATOR CUFF REPAIR     Left  . SPLENECTOMY    . TEE WITHOUT CARDIOVERSION N/A 11/18/2019   Procedure: TRANSESOPHAGEAL ECHOCARDIOGRAM (TEE) WITH PROPOFOL;  Surgeon: Herminio Commons, MD;  Location: [facility];  Service: Cardiology;  Laterality: N/A;  . TEE WITHOUT CARDIOVERSION N/A 01/26/2020   Procedure: TRANSESOPHAGEAL ECHOCARDIOGRAM (TEE);  Surgeon: Melrose Nakayama, MD;  Location: [facility];  Service: Open Heart Surgery;  Laterality: N/A;  . TONSILLECTOMY      SOCIAL HISTORY:  Social History   Socioeconomic History  . Marital status: Single    Spouse name: Not on file  . Number of children: 2  . Years of education: Not on file  . Highest education level: Not on file  Occupational History  . Occupation: Estate agent  Tobacco Use  . Smoking status: Former Smoker    Start date: 10/06/2019  . Smokeless tobacco: Never Used  Vaping Use  . Vaping Use: Never used  Substance and Sexual Activity  . Alcohol use: Yes    Alcohol/week: 1.0 standard drink    Types: 1 Cans of beer per week    Comment: rare   . Drug use: Never  . Sexual activity: Not on file  Other Topics Concern  . Not on file  Social History Narrative  . Not on file   Social Determinants of Health   Financial Resource Strain: Not on file  Food Insecurity: Not on file  Transportation Needs: Not on file  Physical Activity: Not on file  Stress: Not on file  Social Connections: Not on file  Intimate Partner Violence: Not At Risk  . Fear of Current or Ex-Partner: No  . Emotionally Abused: No  . Physically Abused: No   . Sexually Abused: No    FAMILY HISTORY:  Family History  Problem Relation Age of Onset  . Stroke Mother   . Pancreatic cancer Father 4       d. 53  . Stroke Maternal Grandmother   . Heart attack Maternal Grandfather   . Heart Problems Paternal Grandfather   . Scoliosis Daughter   . Muscular dystrophy Grandson     CURRENT MEDICATIONS:  Current Outpatient Medications  Medication Sig Dispense Refill  . apixaban (ELIQUIS) 5 MG TABS tablet Take 1 tablet (5 mg total) by mouth 2 (two) times daily. 180 tablet 1  . aspirin EC 81 MG EC tablet Take 1 tablet (81 mg total) by mouth daily.    Marland Kitchen  atorvastatin (LIPITOR) 80 MG tablet Take 1 tablet (80 mg total) by mouth daily at 6 PM. 90 tablet 1  . ezetimibe (ZETIA) 10 MG tablet Take 1 tablet (10 mg total) by mouth daily. 90 tablet 1  . gabapentin (NEURONTIN) 300 MG capsule Take 1 capsule (300 mg total) by mouth 2 (two) times daily. 180 capsule 0  . insulin glargine (LANTUS SOLOSTAR) 100 UNIT/ML Solostar Pen Inject 36 Units into the skin at bedtime. 5 pen 2  . Insulin Pen Needle 32G X 4 MM MISC 1 Device by Does not apply route daily. 50 each 2  . levETIRAcetam (KEPPRA XR) 500 MG 24 hr tablet Take 1 tablet (500 mg total) by mouth daily. 90 each 3  . levothyroxine (SYNTHROID) 50 MCG tablet TAKE 1 TABLET EVERY DAY 90 tablet 1  . lidocaine-prilocaine (EMLA) cream Apply 1 application topically daily as needed (prior to port being accessed (~every 3 months)).    . metoprolol tartrate (LOPRESSOR) 25 MG tablet Take 1 tablet (25 mg total) by mouth 2 (two) times daily. 180 tablet 3  . nitroGLYCERIN (NITROSTAT) 0.4 MG SL tablet Place 1 tablet (0.4 mg total) under the tongue every 5 (five) minutes as needed for chest pain. 100 tablet 3  . NOVOLOG FLEXPEN 100 UNIT/ML FlexPen INJECT 6 UNITS INTO THE SKIN 3 (THREE) TIMES DAILY WITH MEALS. 15 mL 0  . Omega-3 1000 MG CAPS Take 1,000 mg by mouth daily.    . ondansetron (ZOFRAN) 4 MG tablet Take by mouth.    .  pantoprazole (PROTONIX) 20 MG tablet TAKE 1 TABLET EVERY DAY 90 tablet 1  . prochlorperazine (COMPAZINE) 10 MG tablet Take 10 mg by mouth every 6 (six) hours as needed.    . sertraline (ZOLOFT) 50 MG tablet TAKE 1 TABLET BY MOUTH DAILY (Patient taking differently: Take 50 mg by mouth daily.) 90 tablet 1  . tamsulosin (FLOMAX) 0.4 MG CAPS capsule Take 1 capsule (0.4 mg total) by mouth daily. 90 capsule 3  . traMADol (ULTRAM) 50 MG tablet Take 50 mg by mouth 4 (four) times daily as needed.    . vitamin B-12 (CYANOCOBALAMIN) 1000 MCG tablet Take 1,000 mcg by mouth daily.     No current facility-administered medications for this visit.   Facility-Administered Medications Ordered in Other Visits  Medication Dose Route Frequency Provider Last Rate Last Admin  . 0.9 %  sodium chloride infusion   Intravenous Continuous Derek Jack, MD 20 mL/hr at 12/30/18 1351 New Bag at 12/30/18 1351  . 0.9 %  sodium chloride infusion   Intravenous Continuous Derek Jack, MD 20 mL/hr at 12/30/18 1401 New Bag at 12/30/18 1401  . dextrose 5 % solution   Intravenous Once Derek Jack, MD      . heparin lock flush 100 unit/mL  500 Units Intracatheter Once PRN Derek Jack, MD      . sodium chloride flush (NS) 0.9 % injection 10 mL  10 mL Intracatheter PRN Derek Jack, MD   10 mL at 12/30/18 0911  . sodium chloride flush (NS) 0.9 % injection 10 mL  10 mL Intracatheter PRN Derek Jack, MD   10 mL at 05/21/19 0845    ALLERGIES:  Allergies  Allergen Reactions  . Demerol [Meperidine Hcl] Other (See Comments)    convulsions    PHYSICAL EXAM:  Performance status (ECOG): 1 - Symptomatic but completely ambulatory  There were no vitals filed for this visit. Wt Readings from Last 3 Encounters:  08/16/20 173 lb  1 oz (78.5 kg)  07/12/20 163 lb 12.8 oz (74.3 kg)  06/21/20 160 lb 0.9 oz (72.6 kg)   Physical Exam Vitals reviewed.  Constitutional:      Appearance: Normal  appearance.  Cardiovascular:     Rate and Rhythm: Normal rate and regular rhythm.     Pulses: Normal pulses.     Heart sounds: Normal heart sounds.  Pulmonary:     Effort: Pulmonary effort is normal.     Breath sounds: Normal breath sounds.  Abdominal:     Palpations: Abdomen is soft. There is no hepatomegaly, splenomegaly or mass.     Tenderness: There is no abdominal tenderness.     Hernia: No hernia is present.  Neurological:     General: No focal deficit present.     Mental Status: He is alert and oriented to person, place, and time.  Psychiatric:        Mood and Affect: Mood normal.        Behavior: Behavior normal.      LABORATORY DATA:  I have reviewed the labs as listed.  CBC Latest Ref Rng & Units 08/12/2020 05/18/2020 03/24/2020  WBC 4.0 - 10.5 K/uL 4.1 4.9 5.4  Hemoglobin 13.0 - 17.0 g/dL 14.8 13.7 13.8  Hematocrit 39.0 - 52.0 % 44.3 41.3 40.4  Platelets 150 - 400 K/uL 222 216 216   CMP Latest Ref Rng & Units 08/12/2020 05/18/2020 05/17/2020  Glucose 70 - 99 mg/dL 232(H) 242(H) 551(HH)  BUN 8 - 23 mg/dL _0 Creatinine 0.61 - 1.24 mg/dL 0.69 0.84 0.86  Sodium 135 - 145 mmol/L 134(L) 137 138  Potassium 3.5 - 5.1 mmol/L 5.1 3.8 5.6(H)  Chloride 98 - 111 mmol/L 102 102 102  CO2 22 - 32 mmol/L _1 Calcium 8.9 - 10.3 mg/dL 9.0 9.0 9.8  Total Protein 6.5 - 8.1 g/dL 7.1 6.5 6.6  Total Bilirubin 0.3 - 1.2 mg/dL 1.1 1.6(H) 0.8  Alkaline Phos 38 - 126 U/L 64 48 -  AST 15 - 41 U/L _2 ALT 0 - 44 U/L _3 Lab Results  Component Value Date   CAN199 13 08/12/2020   [id number] 16 03/24/2020   CAN199 13 12/18/2019    DIAGNOSTIC IMAGING:  I have independently reviewed the scans and discussed with the patient. CT Abdomen Pelvis W Contrast  Result Date: 08/13/2020 CLINICAL DATA:  Pancreatic cancer, status post distal pancreatectomy and splenectomy with chemotherapy EXAM: CT ABDOMEN AND PELVIS WITH CONTRAST TECHNIQUE: Multidetector CT imaging of the abdomen  and pelvis was performed using the standard protocol following bolus administration of intravenous contrast. CONTRAST:  125m OMNIPAQUE IOHEXOL 300 MG/ML  SOLN COMPARISON:  03/24/2020 FINDINGS: Lower chest: Mild dependent atelectasis in the lung bases. Hepatobiliary: Liver is within normal limits. No suspicious/enhancing hepatic lesions. Layering subcentimeter gallstone (series 3/image 36), without associated inflammatory changes. No intrahepatic or extrahepatic ductal dilatation. Pancreas: Status post distal pancreatectomy. Suspected fluid collection/seroma along the surgical margin is improved, now measuring 9 mm (series 3/image 31). Additional 11 mm unilocular cyst in the uncinate process (series 3/image 42), unchanged. Spleen: Surgically absent. Adrenals/Urinary Tract: Adrenal glands are within normal limits. Kidneys are within normal limits.  No hydronephrosis. Bladder is within normal limits. Stomach/Bowel: Stomach is within normal limits. No evidence of bowel obstruction. Prior appendectomy. Left colonic diverticulosis, without evidence of diverticulitis. Moderate left colonic stool burden. Vascular/Lymphatic: No evidence of abdominal aortic aneurysm. Atherosclerotic calcifications of the abdominal  aorta and branch vessels. Small retroperitoneal nodes, including a dominant 8 mm short axis left para-aortic node (series 4/image 25), unchanged and within the upper limits of normal. Reproductive: Prostate is unremarkable. Other: No abdominopelvic ascites. Postsurgical changes along the anterior abdominal wall with to small midline and left paramidline fat containing ventral hernias (series 3/images 35 and 43), unchanged. Musculoskeletal: Mild degenerative changes of the lumbar spine. IMPRESSION: Status post distal pancreatectomy and splenectomy. Small para-aortic nodes measuring up to 8 mm short axis, unchanged. No evidence of recurrent or metastatic disease. Electronically Signed   By: Julian Hy M.D.    On: 08/13/2020 08:24     ASSESSMENT:   1.  Stage IIb (T2N1) pancreatic adenocarcinoma: -Status post distal pancreatectomy and splenectomy at [facility] by Dr. Zenia Resides on 11/20/2018. 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 -12 cycles of FOLFIRINOX from 12/30/2018 through 06/04/2019. -MRI of the brain on 05/19/2019 did not show any abnormalities.  This was done because of new onset numbness in the left lower lip during therapy. -CT CAP on 03/24/2020 shows unchanged low-attenuation lesion of the pancreatic uncinate measuring 1.2 x 0.8 cm.  Unchanged low-attenuation lesion near the surgical margin of the pancreatic head and neck junction measuring 1.2 x 0.9 cm.  These may reflect postoperative seroma.  Unchanged prominent retroperitoneal lymph nodes. - CT Dec 2021 IMPRESSION: Status post distal pancreatectomy and splenectomy.  Small para-aortic nodes measuring up to 8 mm short axis, unchanged.  No evidence of recurrent or metastatic disease.  2.  Post splenectomy state: -He received vaccination prior to splenectomy.   PLAN:   1.  Stage IIb (T2N1) pancreatic adenocarcinoma: -No clinical signs or symptoms of recurrence. -CTAP on 08/12/2020 shows no evidence of recurrence. -- Labs from 3 days ago with no concerns for recurrence. -- He has been following up with Dr Raliegh Ip every 3 months. - Will order repeat imaging and labs.  2.  Left upper extremity DVT: -Likely port induced.  To continue Eliquis at this time. -Dr Raliegh Ip discussed about discontinuation of port. We didn't talk about this today  3.  Diabetes: -Continue NovoLog. --Continue Lantus.  4.  Neuropathy: -On and off neuropathy in the hands and feet has been stable.  5. Left wrist cramps, likely partly from repetitive movement like playing guitar as well as possibly some neuropathy from drug. Encouraged to rest, ice it and wear sleeve for a few weeks Encouraged to discuss with his cardiologist as well since cardiac pains can present  with chest pain radiating to left arm.  Benay Pike MD    Orders placed this encounter:  Orders Placed This Encounter  Procedures  . CT CHEST ABDOMEN PELVIS W CONTRAST  . CBC with Differential/Platelet  . Comprehensive metabolic panel  . Cancer antigen 19-9   Benay Pike MD

## 2020-08-16 ENCOUNTER — Inpatient Hospital Stay (HOSPITAL_BASED_OUTPATIENT_CLINIC_OR_DEPARTMENT_OTHER): Payer: Medicare HMO | Admitting: Hematology and Oncology

## 2020-08-16 ENCOUNTER — Inpatient Hospital Stay (HOSPITAL_COMMUNITY): Payer: Medicare HMO

## 2020-08-16 ENCOUNTER — Encounter (HOSPITAL_COMMUNITY): Payer: Self-pay | Admitting: Hematology and Oncology

## 2020-08-16 ENCOUNTER — Other Ambulatory Visit: Payer: Self-pay

## 2020-08-16 VITALS — BP 112/58 | HR 59 | Temp 98.1°F | Resp 17 | Wt 173.1 lb

## 2020-08-16 DIAGNOSIS — C259 Malignant neoplasm of pancreas, unspecified: Secondary | ICD-10-CM | POA: Diagnosis not present

## 2020-08-16 DIAGNOSIS — E1140 Type 2 diabetes mellitus with diabetic neuropathy, unspecified: Secondary | ICD-10-CM | POA: Diagnosis not present

## 2020-08-16 DIAGNOSIS — R252 Cramp and spasm: Secondary | ICD-10-CM | POA: Diagnosis not present

## 2020-08-16 DIAGNOSIS — Z9081 Acquired absence of spleen: Secondary | ICD-10-CM | POA: Diagnosis not present

## 2020-08-16 DIAGNOSIS — R5383 Other fatigue: Secondary | ICD-10-CM | POA: Diagnosis not present

## 2020-08-16 DIAGNOSIS — C251 Malignant neoplasm of body of pancreas: Secondary | ICD-10-CM | POA: Diagnosis not present

## 2020-08-16 DIAGNOSIS — Z7901 Long term (current) use of anticoagulants: Secondary | ICD-10-CM | POA: Diagnosis not present

## 2020-08-16 DIAGNOSIS — N400 Enlarged prostate without lower urinary tract symptoms: Secondary | ICD-10-CM | POA: Diagnosis not present

## 2020-08-16 DIAGNOSIS — I2510 Atherosclerotic heart disease of native coronary artery without angina pectoris: Secondary | ICD-10-CM | POA: Diagnosis not present

## 2020-08-16 DIAGNOSIS — Z90411 Acquired partial absence of pancreas: Secondary | ICD-10-CM | POA: Diagnosis not present

## 2020-08-16 MED ORDER — HEPARIN SOD (PORK) LOCK FLUSH 100 UNIT/ML IV SOLN
500.0000 [IU] | Freq: Once | INTRAVENOUS | Status: AC
  Administered 2020-08-16: 500 [IU] via INTRAVENOUS

## 2020-08-16 MED ORDER — NITROGLYCERIN 0.4 MG SL SUBL: 0.4000 mg | SUBLINGUAL_TABLET | SUBLINGUAL | 3 refills | Status: DC | PRN

## 2020-08-16 MED ORDER — SODIUM CHLORIDE 0.9% FLUSH
10.0000 mL | Freq: Once | INTRAVENOUS | Status: AC
  Administered 2020-08-16: 10 mL via INTRAVENOUS

## 2020-08-16 NOTE — Progress Notes (Signed)
Patient presented today for office.  Port flushed without difficulty.  Blood return noted and deaccessed.  Patient tolerated without complaints or pain. Vital signs stable.

## 2020-08-16 NOTE — Telephone Encounter (Signed)
Refilled NTG to [facility]

## 2020-08-17 DIAGNOSIS — E1140 Type 2 diabetes mellitus with diabetic neuropathy, unspecified: Secondary | ICD-10-CM | POA: Diagnosis not present

## 2020-08-17 DIAGNOSIS — K219 Gastro-esophageal reflux disease without esophagitis: Secondary | ICD-10-CM | POA: Diagnosis not present

## 2020-08-17 DIAGNOSIS — K5792 Diverticulitis of intestine, part unspecified, without perforation or abscess without bleeding: Secondary | ICD-10-CM | POA: Diagnosis not present

## 2020-08-17 DIAGNOSIS — I10 Essential (primary) hypertension: Secondary | ICD-10-CM | POA: Diagnosis not present

## 2020-08-17 DIAGNOSIS — Z8507 Personal history of malignant neoplasm of pancreas: Secondary | ICD-10-CM | POA: Diagnosis not present

## 2020-08-17 DIAGNOSIS — N400 Enlarged prostate without lower urinary tract symptoms: Secondary | ICD-10-CM | POA: Diagnosis not present

## 2020-08-17 DIAGNOSIS — Z0001 Encounter for general adult medical examination with abnormal findings: Secondary | ICD-10-CM | POA: Diagnosis not present

## 2020-08-17 DIAGNOSIS — G40009 Localization-related (focal) (partial) idiopathic epilepsy and epileptic syndromes with seizures of localized onset, not intractable, without status epilepticus: Secondary | ICD-10-CM | POA: Diagnosis not present

## 2020-08-17 DIAGNOSIS — M5451 Vertebrogenic low back pain: Secondary | ICD-10-CM | POA: Diagnosis not present

## 2020-09-01 ENCOUNTER — Ambulatory Visit (INDEPENDENT_AMBULATORY_CARE_PROVIDER_SITE_OTHER): Payer: Medicare HMO | Admitting: "Endocrinology

## 2020-09-01 ENCOUNTER — Encounter: Payer: Self-pay | Admitting: "Endocrinology

## 2020-09-01 ENCOUNTER — Other Ambulatory Visit: Payer: Self-pay

## 2020-09-01 VITALS — BP 113/65 | HR 70 | Ht 70.0 in | Wt 174.8 lb

## 2020-09-01 DIAGNOSIS — E1165 Type 2 diabetes mellitus with hyperglycemia: Secondary | ICD-10-CM

## 2020-09-01 DIAGNOSIS — I10 Essential (primary) hypertension: Secondary | ICD-10-CM

## 2020-09-01 DIAGNOSIS — E782 Mixed hyperlipidemia: Secondary | ICD-10-CM | POA: Diagnosis not present

## 2020-09-01 DIAGNOSIS — E039 Hypothyroidism, unspecified: Secondary | ICD-10-CM | POA: Diagnosis not present

## 2020-09-01 MED ORDER — LANTUS SOLOSTAR 100 UNIT/ML ~~LOC~~ SOPN
40.0000 [IU] | PEN_INJECTOR | Freq: Every day | SUBCUTANEOUS | 1 refills | Status: DC
Start: 2020-09-01 — End: 2020-12-01

## 2020-09-01 MED ORDER — FREESTYLE LIBRE 2 READER DEVI: 0 refills | Status: DC

## 2020-09-01 MED ORDER — NOVOLOG FLEXPEN 100 UNIT/ML ~~LOC~~ SOPN: 8.0000 [IU] | PEN_INJECTOR | Freq: Three times a day (TID) | SUBCUTANEOUS | 1 refills | Status: DC

## 2020-09-01 MED ORDER — FREESTYLE LIBRE 2 SENSOR MISC: 1.0000 | 3 refills | Status: DC

## 2020-09-01 NOTE — Progress Notes (Signed)
09/01/2020, 12:08 PM       Endocrinology follow-up note   Subjective:    Patient ID: Mark Foster, male    DOB: [birth year].  Mark Foster is being seen in follow-up for  management of currently uncontrolled symptomatic pancreatic diabetes, hypothyroidism, hyperlipidemia. PMD:  Elenore Paddy, NP.   Past Medical History:  Diagnosis Date  . CAD (coronary artery disease)    a. 01/2020: CABG x4 with LIMA-LAD, SVG-OM, SVG-PDA and SVG-D1  . Carotid artery obstruction, left    Left ICA occlusion  . Cervical compression fracture (HCC)   . Cervical spinal stenosis   . Coronary artery disease   . Diabetic neuropathy (HCC)    Bilateral legs  . Diverticulitis   . Family history of pancreatic cancer   . History of kidney stones   . Hypothyroidism   . Pancreatic cancer (HCC)   . Stenosis of left vertebral artery   . 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 Stroke [facility])    2011  . Type 2 diabetes mellitus (HCC)    Past Surgical History:  Procedure Laterality Date  . APPENDECTOMY    . BUBBLE STUDY N/A 11/18/2019   Procedure: BUBBLE STUDY;  Surgeon: Laqueta Linden, MD;  Location: [facility];  Service: Cardiology;  Laterality: N/A;  . CARDIAC SURGERY    . COLON RESECTION     For diverticulitis  . COLON SURGERY    . CORONARY ARTERY BYPASS GRAFT N/A 01/26/2020   Procedure: CORONARY ARTERY BYPASS GRAFTING (CABG) TIMES FOUR USING LEFT INTERNAL MAMMARY VEIN AND RIGHT GREATER SAPHENOUS VEIN;  Surgeon: Loreli Slot, MD;  Location: [facility];  Service: Open Heart Surgery;  Laterality: N/A;  . ENDOVEIN HARVEST OF GREATER SAPHENOUS VEIN Right 01/26/2020   Procedure: Mack Guise Of Greater Saphenous Vein;  Surgeon: Loreli Slot, MD;  Location: [facility] OR;  Service: Open Heart Surgery;  Laterality: Right;  . ESOPHAGOGASTRODUODENOSCOPY N/A 10/03/2018   Procedure: ESOPHAGOGASTRODUODENOSCOPY (EGD);   Surgeon: Rachael Fee, MD;  Location: [facility];  Service: Endoscopy;  Laterality: N/A;  . EUS N/A 10/03/2018   Procedure: UPPER ENDOSCOPIC ULTRASOUND (EUS) RADIAL;  Surgeon: Rachael Fee, MD;  Location: [facility];  Service: Endoscopy;  Laterality: N/A;  . EYE SURGERY Bilateral   . FINE NEEDLE ASPIRATION N/A 10/03/2018   Procedure: FINE NEEDLE ASPIRATION (FNA) LINEAR;  Surgeon: Rachael Fee, MD;  Location: [facility];  Service: Endoscopy;  Laterality: N/A;  . IR ANGIO INTRA EXTRACRAN SEL COM CAROTID INNOMINATE BILAT MOD SED  01/21/2018  . IR ANGIO INTRA EXTRACRAN SEL COM CAROTID INNOMINATE UNI R MOD SED  03/21/2018  . IR ANGIO VERTEBRAL SEL VERTEBRAL BILAT MOD SED  01/21/2018  . IR TRANSCATH EXCRAN VERT OR CAR A STENT  03/21/2018  . LEFT HEART CATH AND CORONARY ANGIOGRAPHY N/A 01/08/2020   Procedure: LEFT HEART CATH AND CORONARY ANGIOGRAPHY;  Surgeon: Marykay Lex, MD;  Location: [facility] INVASIVE CV LAB;  Service: Cardiovascular;  Laterality: N/A;  . PORTACATH PLACEMENT Left 12/23/2018   Procedure: INSERTION PORT-A-CATH;  Surgeon: Lucretia Roers, MD;  Location: [facility];  Service:  General;  Laterality: Left;  . RADIOLOGY WITH ANESTHESIA N/A 03/21/2018   Procedure: IR WITH ANESTHESIA WITH STENT PLACEMENT;  Surgeon: Luanne Bras, MD;  Location: [place];  Service: Radiology;  Laterality: N/A;  . ROTATOR CUFF REPAIR     Left  . SPLENECTOMY    . TEE WITHOUT CARDIOVERSION N/A 11/18/2019   Procedure: TRANSESOPHAGEAL ECHOCARDIOGRAM (TEE) WITH PROPOFOL;  Surgeon: Herminio Commons, MD;  Location: [facility];  Service: Cardiology;  Laterality: N/A;  . TEE WITHOUT CARDIOVERSION N/A 01/26/2020   Procedure: TRANSESOPHAGEAL ECHOCARDIOGRAM (TEE);  Surgeon: Melrose Nakayama, MD;  Location: [place];  Service: Open Heart Surgery;  Laterality: N/A;  . TONSILLECTOMY     Social History   Socioeconomic History  . Marital status: Single    Spouse name: Not on file  . Number of children: 2  .  Years of education: Not on file  . Highest education level: Not on file  Occupational History  . Occupation: Estate agent  Tobacco Use  . Smoking status: Former Smoker    Start date: 10/06/2019  . Smokeless tobacco: Never Used  Vaping Use  . Vaping Use: Never used  Substance and Sexual Activity  . Alcohol use: Yes    Alcohol/week: 1.0 standard drink    Types: 1 Cans of beer per week    Comment: rare   . Drug use: Never  . Sexual activity: Not on file  Other Topics Concern  . Not on file  Social History Narrative  . Not on file   Social Determinants of Health   Financial Resource Strain: Not on file  Food Insecurity: Not on file  Transportation Needs: Not on file  Physical Activity: Not on file  Stress: Not on file  Social Connections: Not on file   Outpatient Encounter Medications as of 09/01/2020  Medication Sig  . Continuous Blood Gluc Receiver (FREESTYLE LIBRE 2 READER) DEVI As directed  . Continuous Blood Gluc Sensor (FREESTYLE LIBRE 2 SENSOR) MISC 1 Piece by Does not apply route every 14 (fourteen) days.  Marland Kitchen apixaban (ELIQUIS) 5 MG TABS tablet Take 1 tablet (5 mg total) by mouth 2 (two) times daily.  Marland Kitchen aspirin EC 81 MG EC tablet Take 1 tablet (81 mg total) by mouth daily.  Marland Kitchen atorvastatin (LIPITOR) 80 MG tablet Take 1 tablet (80 mg total) by mouth daily at 6 PM.  . ezetimibe (ZETIA) 10 MG tablet TAKE 1 TABLET (10 MG TOTAL) BY MOUTH DAILY.  Marland Kitchen gabapentin (NEURONTIN) 300 MG capsule Take 1 capsule (300 mg total) by mouth 2 (two) times daily.  . insulin aspart (NOVOLOG FLEXPEN) 100 UNIT/ML FlexPen Inject 8-14 Units into the skin 3 (three) times daily before meals.  . insulin glargine (LANTUS SOLOSTAR) 100 UNIT/ML Solostar Pen Inject 40 Units into the skin at bedtime.  . Insulin Pen Needle 32G X 4 MM MISC 1 Device by Does not apply route daily.  Marland Kitchen levETIRAcetam (KEPPRA XR) 500 MG 24 hr tablet Take 1 tablet (500 mg total) by mouth daily.  Marland Kitchen levothyroxine (SYNTHROID) 50 MCG  tablet TAKE 1 TABLET EVERY DAY  . lidocaine-prilocaine (EMLA) cream Apply 1 application topically daily as needed (prior to port being accessed (~every 3 months)).  . metoprolol tartrate (LOPRESSOR) 25 MG tablet Take 1 tablet (25 mg total) by mouth 2 (two) times daily.  . nitroGLYCERIN (NITROSTAT) 0.4 MG SL tablet Place 1 tablet (0.4 mg total) under the tongue every 5 (five) minutes as needed for chest pain.  . Omega-3  1000 MG CAPS Take 1,000 mg by mouth daily.  . ondansetron (ZOFRAN) 4 MG tablet Take by mouth.  . pantoprazole (PROTONIX) 20 MG tablet TAKE 1 TABLET EVERY DAY  . prochlorperazine (COMPAZINE) 10 MG tablet Take 10 mg by mouth every 6 (six) hours as needed.  . sertraline (ZOLOFT) 50 MG tablet TAKE 1 TABLET BY MOUTH DAILY (Patient taking differently: Take 50 mg by mouth daily.)  . tamsulosin (FLOMAX) 0.4 MG CAPS capsule Take 1 capsule (0.4 mg total) by mouth daily.  . traMADol (ULTRAM) 50 MG tablet Take 50 mg by mouth 4 (four) times daily as needed.  . vitamin B-12 (CYANOCOBALAMIN) 1000 MCG tablet Take 1,000 mcg by mouth daily.  . [DISCONTINUED] atorvastatin (LIPITOR) 80 MG tablet Take 1 tablet (80 mg total) by mouth daily at 6 PM.  . [DISCONTINUED] insulin glargine (LANTUS SOLOSTAR) 100 UNIT/ML Solostar Pen Inject 36 Units into the skin at bedtime.  . [DISCONTINUED] NOVOLOG FLEXPEN 100 UNIT/ML FlexPen INJECT 6 UNITS INTO THE SKIN 3 (THREE) TIMES DAILY WITH MEALS.   Facility-Administered Encounter Medications as of 09/01/2020  Medication  . 0.9 %  sodium chloride infusion  . 0.9 %  sodium chloride infusion  . dextrose 5 % solution  . heparin lock flush 100 unit/mL  . sodium chloride flush (NS) 0.9 % injection 10 mL  . sodium chloride flush (NS) 0.9 % injection 10 mL    ALLERGIES: Allergies  Allergen Reactions  . Demerol [Meperidine Hcl] Other (See Comments)    convulsions    VACCINATION STATUS: Immunization History  Administered Date(s) Administered  . Fluad Quad(high  Dose 65+) 05/18/2020  . HiB (PRP-T) 10/23/2018  . Influenza,inj,Quad PF,6+ Mos 06/19/2019  . Influenza-Unspecified 08/02/2018  . Meningococcal Mcv4o 10/23/2018  . Pneumococcal Conjugate-13 10/23/2018    Diabetes He presents for his follow-up diabetic visit. He has type 1 diabetes mellitus. Onset time: He was diagnosed at approximate age of 27 years. His disease course has been improving (He did have A1c of 9.7% in May 2019.  However, more recently he has documented controlled glycemic profile averaging 134 over the last 2 weeks.). There are no hypoglycemic associated symptoms. Pertinent negatives for hypoglycemia include no confusion, headaches, pallor or seizures. Associated symptoms include polydipsia and polyuria. Pertinent negatives for diabetes include no chest pain, no fatigue, no polyphagia and no weakness. There are no hypoglycemic complications. Symptoms are improving. Diabetic complications include a CVA. Pertinent negatives for diabetic complications include no heart disease. (Awaiting stent placement in his carotid artery.) Risk factors for coronary artery disease include diabetes mellitus, dyslipidemia, hypertension, male sex and sedentary lifestyle. Current diabetic treatment includes insulin injections. His weight is increasing steadily. He is following a generally unhealthy diet. When asked about meal planning, he reported none. He has not had a previous visit with a dietitian. He rarely participates in exercise. His home blood glucose trend is fluctuating minimally. His breakfast blood glucose range is generally >200 mg/dl. His bedtime blood glucose range is generally >200 mg/dl. His overall blood glucose range is >200 mg/dl. (He missed his last several appointments, presents with a meter showing average blood glucose of 220-238 over the last 30 days.  His A1c was 8.9% earlier this month.  He did not document any hypoglycemia.   ) An ACE inhibitor/angiotensin II receptor blocker is not  being taken.  Hyperlipidemia This is a chronic problem. The current episode started more than 1 year ago. The problem is uncontrolled. Exacerbating diseases include diabetes. Pertinent negatives include no  chest pain, myalgias or shortness of breath. Current antihyperlipidemic treatment includes statins. Risk factors for coronary artery disease include diabetes mellitus, dyslipidemia, hypertension, male sex and a sedentary lifestyle.   Review of systems: Limited as above   Objective:    BP 113/65   Pulse 70   Ht 5\' 10"  (1.778 m)   Wt 174 lb 12.8 oz (79.3 kg)   BMI 25.08 kg/m   Wt Readings from Last 3 Encounters:  09/01/20 174 lb 12.8 oz (79.3 kg)  08/16/20 173 lb 1 oz (78.5 kg)  07/12/20 163 lb 12.8 oz (74.3 kg)      CMP ( most recent) CMP     Component Value Date/Time   NA 134 (L) 08/12/2020 0900   K 5.1 08/12/2020 0900   CL 102 08/12/2020 0900   CO2 27 08/12/2020 0900   GLUCOSE 232 (H) 08/12/2020 0900   BUN 20 08/12/2020 0900   CREATININE 0.69 08/12/2020 0900   CREATININE 0.86 05/17/2020 1329   CALCIUM 9.0 08/12/2020 0900   PROT 7.1 08/12/2020 0900   ALBUMIN 4.1 08/12/2020 0900   AST 23 08/12/2020 0900   AST 16 12/11/2018 1018   ALT 25 08/12/2020 0900   ALT 19 12/11/2018 1018   ALKPHOS 64 08/12/2020 0900   BILITOT 1.1 08/12/2020 0900   BILITOT 0.8 12/11/2018 1018   GFRNONAA >60 08/12/2020 0900   GFRNONAA 91 05/17/2020 1329   GFRAA >60 05/18/2020 1041   GFRAA 105 05/17/2020 1329     Diabetic Labs (most recent): Lab Results  Component Value Date   HGBA1C 8.9 (H) 08/12/2020   HGBA1C 11.3 (H) 05/17/2020   HGBA1C 9.4 (H) 01/22/2020     Lipid Panel ( most recent) Lipid Panel     Component Value Date/Time   CHOL 111 05/17/2020 1329   TRIG 90 05/17/2020 1329   HDL 50 05/17/2020 1329   CHOLHDL 2.2 05/17/2020 1329   VLDL 15 01/06/2020 1008   LDLCALC 44 05/17/2020 1329    Assessment & Plan:   1. Uncontrolled type 2 diabetes mellitus with hyperglycemia  ([place])  - Mark Foster has history of uncontrolled diabetes .  His last visit here was in March 2021.  He missed his last several appointments, presents with a meter showing average blood glucose of 220-238 over the last 30 days.  His A1c was 8.9% earlier this month.  He did not document any hypoglycemia.       He reports that his fasting blood glucose readings are above 200 mg per DL.    -  Recent labs reviewed.  -his diabetes is likely pancreatic diabetes given his history of heavy alcohol use in the past.  Administrative reclassify his diabetes type I for management purposes. -He is advised to introduce more carbohydrates to his diet.  He would benefit from some weight gain.  -He is not a suitable candidate for oral antidiabetic medications nor incretin therapy. -Given his pancreatic diabetes, even before he was diagnosed with pancreatic cancer, the best choice of therapy for his diabetes will be exclusively with insulin. -He is advised to increase Lantus to 40 units nightly, advised to increase NovoLog to 8  6 units 3 times daily AC for Premeal blood glucose readings between 90-150 mg per DL, and specific correction dose for readings above 150 mg per DL.  He agrees to continue to monitor blood glucose 4 times a day-before meals and at bedtime.   -This patient would benefit from a CGM.  I discussed and  prescribed the freestyle libre device for him.   -Patient is encouraged to call clinic for blood glucose levels less than 70 or above 300 mg /dl.  - Patient specific target  A1c;  LDL, HDL, Triglycerides, were discussed in detail. -He is anti-GAD antibodies, anti-islet cell antibodies are negative indicating his diabetes is not likely to be type 1,  likely related to his prior alcohol abuse causing pancreatic diabetes.   -The priority in this patient would be to avoid hypoglycemia due to lack of alpha cell  glucagon response on the background of alcohol induced diabetes.  2) Lipids/HPL:  His recent lipid panel showed controlled LDL at 44, improving from 123.  He is advised to continue Lipitor 40 mg p.o. nightly.      3) hypertension/blood pressure: His blood pressure is controlled to target.  He is on metoprolol 100 mg p.o. daily.  4)  Weight/Diet: He continues to regain his weight back.  He is not a candidate for weight loss.  He is encouraged to continue to introduce complex carbohydrates.  Diet, exercise, and detailed carbohydrates information provided.  5) hypothyroidism -He is currently on levothyroxine 50 mcg p.o. daily.  His previsit thyroid function tests are consistent with appropriate replacement.     - We discussed about the correct intake of his thyroid hormone, on empty stomach at fasting, with water, separated by at least 30 minutes from breakfast and other medications,  and separated by more than 4 hours from calcium, iron, multivitamins, acid reflux medications (PPIs). -Patient is made aware of the fact that thyroid hormone replacement is needed for life, dose to be adjusted by periodic monitoring of thyroid function tests.  6) Chronic Care/Health Maintenance:  -he  is on Statin medications and  is encouraged to continue to follow up with Ophthalmology, Dentist,  Podiatrist at least yearly or according to recommendations, and advised to  stay away from smoking. I have recommended yearly flu vaccine and pneumonia vaccination at least every 5 years;  and  sleep for at least 7 hours a day.   POCT ABI Results 09/01/20  He is ABIs normal today September 01, 2020. Right ABI: 1.04      left ABI: 1.12  Right leg systolic / diastolic: [id number] mmHg Left leg systolic / diastolic: 123456 mmHg  Arm systolic / diastolic: [id number] mmHG This study will be repeated in December 2026, or sooner if needed. - I advised patient to maintain close follow up with Ailene Ards, NP for primary care needs.   - Time spent on this patient care encounter:  35 min, of which > 50% was  spent in  counseling and the rest reviewing his blood glucose logs , discussing his hypoglycemia and hyperglycemia episodes, reviewing his current and  previous labs / studies  ( including abstraction from other facilities) and medications  doses and developing a  long term treatment plan and documenting his care.   Please refer to Patient Instructions for Blood Glucose Monitoring and Insulin/Medications Dosing Guide"  in media tab for additional information. Please  also refer to " Patient Self Inventory" in the Media  tab for reviewed elements of pertinent patient history.  Mark Foster participated in the discussions, expressed understanding, and voiced agreement with the above plans.  All questions were answered to his satisfaction. he is encouraged to contact clinic should he have any questions or concerns prior to his return visit.    Follow up plan: - Return in about 3 months (around  11/30/2020) for Bring Meter and Logs- A1c in Office.  Glade Lloyd, MD [facility] [address] [place], [place] [address] Phone: [phone]  Fax: [phone]    09/01/2020, 12:08 PM  This note was partially dictated with voice recognition software. Similar sounding words can be transcribed inadequately or may not  be corrected upon review.

## 2020-09-01 NOTE — Patient Instructions (Signed)
                                     Advice for Weight Management  -For most of us the best way to lose weight is by diet management. Generally speaking, diet management means consuming less calories intentionally which over time brings about progressive weight loss.  This can be achieved more effectively by restricting carbohydrate consumption to the minimum possible.  So, it is critically important to know your numbers: how much calorie you are consuming and how much calorie you need. More importantly, our carbohydrates sources should be unprocessed or minimally processed complex starch food items.   Sometimes, it is important to balance nutrition by increasing protein intake (animal or plant source), fruits, and vegetables.  -Sticking to a routine mealtime to eat 3 meals a day and avoiding unnecessary snacks is shown to have a big role in weight control. Under normal circumstances, the only time we lose real weight is when we are hungry, so allow hunger to take place- hunger means no food between meal times, only water.  It is not advisable to starve.   -It is better to avoid simple carbohydrates including: Cakes, Sweet Desserts, Ice Cream, Soda (diet and regular), Sweet Tea, Candies, Chips, Cookies, Store Bought Juices, Alcohol in Excess of  1-2 drinks a day, Lemonade,  Artificial Sweeteners, Doughnuts, Coffee Creamers, "Sugar-free" Products, etc, etc.  This is not a complete list.....    -Consulting with certified diabetes educators is proven to provide you with the most accurate and current information on diet.  Also, you may be  interested in discussing diet options/exchanges , we can schedule a visit with Mark Foster, RDN, CDE for individualized nutrition education.  -Exercise: If you are able: 30 -60 minutes a day ,4 days a week, or 150 minutes a week.  The longer the better.  Combine stretch, strength, and aerobic activities.  If you were told in the  past that you have high risk for cardiovascular diseases, you may seek evaluation by your heart doctor prior to initiating moderate to intense exercise programs.                                  Additional Care Considerations for Diabetes   -Diabetes  is a chronic disease.  The most important care consideration is regular follow-up with your diabetes care provider with the goal being avoiding or delaying its complications and to take advantage of advances in medications and technology.    -Type 2 diabetes is known to coexist with other important comorbidities such as high blood pressure and high cholesterol.  It is critical to control not only the diabetes but also the high blood pressure and high cholesterol to minimize and delay the risk of complications including coronary artery disease, stroke, amputations, blindness, etc.    - Studies showed that people with diabetes will benefit from a class of medications known as ACE inhibitors and statins.  Unless there are specific reasons not to be on these medications, the standard of care is to consider getting one from these groups of medications at an optimal doses.  These medications are generally considered safe and proven to help protect the heart and the kidneys.    - People with diabetes are encouraged to initiate and maintain regular follow-up with eye doctors, foot   doctors, dentists , and if necessary heart and kidney doctors.     - It is highly recommended that people with diabetes quit smoking or stay away from smoking, and get yearly  flu vaccine and pneumonia vaccine at least every 5 years.  One other important lifestyle recommendation is to ensure adequate sleep - at least 6-7 hours of uninterrupted sleep at night.  -Exercise: If you are able: 30 -60 minutes a day, 4 days a week, or 150 minutes a week.  The longer the better.  Combine stretch, strength, and aerobic activities.  If you were told in the past that you have high risk for  cardiovascular diseases, you may seek evaluation by your heart doctor prior to initiating moderate to intense exercise programs.          

## 2020-09-08 ENCOUNTER — Other Ambulatory Visit (HOSPITAL_COMMUNITY): Payer: Self-pay | Admitting: Hematology

## 2020-09-08 ENCOUNTER — Other Ambulatory Visit: Payer: Self-pay | Admitting: Adult Health

## 2020-09-09 NOTE — Telephone Encounter (Signed)
I called pt and asked if he has been taking gabapentin daily he states he has , not getting from anyone else.  Ok to refill? 300mg  po bid humana

## 2020-09-20 ENCOUNTER — Ambulatory Visit (INDEPENDENT_AMBULATORY_CARE_PROVIDER_SITE_OTHER): Payer: Medicare HMO | Admitting: Nurse Practitioner

## 2020-09-29 ENCOUNTER — Encounter: Payer: Self-pay | Admitting: Adult Health

## 2020-09-29 ENCOUNTER — Other Ambulatory Visit: Payer: Self-pay

## 2020-09-29 ENCOUNTER — Ambulatory Visit: Payer: Medicare HMO | Admitting: Adult Health

## 2020-09-29 VITALS — BP 96/60 | HR 78 | Ht 70.0 in | Wt 183.2 lb

## 2020-09-29 DIAGNOSIS — Z8673 Personal history of transient ischemic attack (TIA), and cerebral infarction without residual deficits: Secondary | ICD-10-CM

## 2020-09-29 DIAGNOSIS — R202 Paresthesia of skin: Secondary | ICD-10-CM

## 2020-09-29 DIAGNOSIS — R200 Anesthesia of skin: Secondary | ICD-10-CM | POA: Diagnosis not present

## 2020-09-29 DIAGNOSIS — I69398 Other sequelae of cerebral infarction: Secondary | ICD-10-CM | POA: Diagnosis not present

## 2020-09-29 DIAGNOSIS — G629 Polyneuropathy, unspecified: Secondary | ICD-10-CM | POA: Diagnosis not present

## 2020-09-29 DIAGNOSIS — R569 Unspecified convulsions: Secondary | ICD-10-CM

## 2020-09-29 MED ORDER — LEVETIRACETAM ER 750 MG PO TB24: 750.0000 mg | ORAL_TABLET | Freq: Every day | ORAL | 3 refills | Status: DC

## 2020-09-29 MED ORDER — GABAPENTIN 300 MG PO CAPS
300.0000 mg | ORAL_CAPSULE | Freq: Three times a day (TID) | ORAL | 3 refills | Status: DC
Start: 2020-09-29 — End: 2021-10-06

## 2020-09-29 NOTE — Patient Instructions (Addendum)
Increase gabapentin to 300mg  three times daily  EMG/NCV to rule out compressive nerve issues  Concern for possible seizure type activity therefore recommend slightly increasing your Keppra dosage from 500 mg to 750 mg daily -please ensure you also discuss further with your cardiologist to ensure no further work-up needs to be completed from their standpoint  Ensure follow up with Dr. Estanislado Pandy for repeat imaging  Continue aspirin 81 mg daily and Eliquis (apixaban) daily  and atorvastatin  for secondary stroke prevention  Continue to follow up with PCP regarding cholesterol, blood pressure and diabetes management  Maintain strict control of hypertension with blood pressure goal below 130/90, diabetes with hemoglobin A1c goal below 7% and cholesterol with LDL cholesterol (bad cholesterol) goal below 70 mg/dL.       Followup in the future with me in 6 months or call earlier if needed       Thank you for coming to see [place] at [facility]. I hope we have been able to provide you high quality care today.  You may receive a patient satisfaction survey over the next few weeks. We would appreciate your feedback and comments so that we may continue to improve ourselves and the health of our patients.

## 2020-09-29 NOTE — Progress Notes (Signed)
[facility] [address] [place]. [place] [address] [phone]       OFFICE FOLLOW UP NOTE  Mr. Mark Foster Date of Birth:  26-Apr-1956 Medical Record Number:  [id number]   Referring MD: Minus Breeding Reason for Referral: Stroke   Chief complaint: Chief Complaint  Patient presents with  . Follow-up    RM 14 with fiance (Mark Foster) Pt is well, have some cramping and numbness in L hand       HPI:   Today, 09/29/2020, Mark Foster returns for 66-month stroke follow-up. He reports he has done well from a stroke standpoint without new stroke/TIA symptoms.  He does report over the past 3 months progressive L>R hand numbness and left forearm, wrist and hand cramping especially when attempting to play his guitar.  He does have known neuropathy previously BLE distally on gabapentin as well as prior mention of BUE numbness/tingling but reports progressive worsening over the past 3 months interfering with activity.  He has remained on Keppra XR 500 mg daily tolerating without side effects but wife does report presyncopal type event 1 to 2 weeks ago and yesterday felt generalized weakness, fatigue and "seemed out of it".  This did not progress to presyncopal or syncopal event or loss of consciousness.  Similar events to prior episodes for which he was started on Keppra.  He denies missing any Keppra dosages or recent medication changes.  He has remained on Eliquis for LUE DVT currently managed by oncology with plans on continuing Eliquis until port removal. Remains on aspirin, atorvastatin and Zetia for secondary stroke prevention without side effects. Prior lipid panel (05/2020) showed LDL 44. Blood pressure today 96/60. Glucose levels improving per report. Recent A1c 8.9 down from 11.3. Routinely follows with endocrinology.  No further concerns at this time.   History provided for reference purposes only Update 03/29/2020 JM: Mark Foster returns for follow-up with prior history of  stroke and transient LOC episodes possibly syncope vs seizure.  Stable from a neurological standpoint since prior visit without recurrent episodes and remains on Keppra XR 500 mg daily tolerating well without side effects.  EEG completed which was normal.  Since prior visit, underwent CABGx4 and 01/2020 with a left subclavian DVT shortly after discharge and initiation of Eliquis currently managed by cardiology.  Previously on Plavix for stroke prevention and initiation of aspirin by cardiology post CABG procedure.  Remains on Eliquis, Plavix and aspirin without bleeding or bruising.  Continues on atorvastatin 80 mg daily and Zetia 10 mg daily without myalgias.  Blood pressure today 140/66.  Reports glucose levels stable.  DM managed by endocrinology.  Follows closely with cardiology, cardiothoracic and PCP.  No concerns at this time.  Update 12/11/2019 Dr. Leonie Man: Mark Foster is a 65 year old Caucasian male with past medical history of diabetes, vitamin D deficiency, pancreatic cancer s/p chemotherapy, hypothyroidism, diabetic neuropathy stroke in 2011 and hypertension who presented to [facility] on 10[facility]09/2019 with episode of brief unresponsiveness.  Patient states that he had gone to a musical open mic outdoor event with a friend.  He remembers keeping his guitar down and going to the bar and getting a beer for himself and his friend.  He lost consciousness and the next thing he remembers was waking up in the ambulance.  Apparently there was no witnessed tonic-clonic activity tongue bite.  The patient was little confused and had some slurred speech when he woke up.  The symptoms resolved by the time he reached  the hospital.  He had an MRI scan of the brain done on 10/10/2019 which I personally reviewed shows tiny enhancing areas of enhancement in the right temporal lobe likely subacute infarcts.  Metastasis would be less likely.  He does have a previous history of cryptogenic stroke in the left MCA  for several years ago.  He was in fact followed in office and had a strokelike episode on 01/19/2018 which was treated with IV TPA and showed clinical improvement at that time the MRI had shown remote age left right MCA infarct which patient was not aware of.  He had stent assisted angioplasty of left vertebral artery stenosis on 03/21/2018 by Dr. Estanislado Pandy.  He has no prior history so history of seizures in the form of loss of consciousness, tongue biting or injury.  He denies any headache, slurred speech, gait or balance problems.  ROS:   14 system review of systems is positive for see HPI and all other systems negative   PMH:  Past Medical History:  Diagnosis Date  . CAD (coronary artery disease)    a. 01/2020: CABG x4 with LIMA-LAD, SVG-OM, SVG-PDA and SVG-D1  . Carotid artery obstruction, left    Left ICA occlusion  . Cervical compression fracture ([place])   . Cervical spinal stenosis   . Coronary artery disease   . Diabetic neuropathy ([facility])    Bilateral legs  . Diverticulitis   . Family history of pancreatic cancer   . History of kidney stones   . Hypothyroidism   . Pancreatic cancer ([place])   . Stenosis of left vertebral artery   . Stroke (cerebrum) ([place]) 10/09/2019  . Stroke [facility])    2011  . Type 2 diabetes mellitus ([place])     Social History:  Social History   Socioeconomic History  . Marital status: Single    Spouse name: Not on file  . Number of children: 2  . Years of education: Not on file  . Highest education level: Not on file  Occupational History  . Occupation: Estate agent  Tobacco Use  . Smoking status: Former Smoker    Start date: 10/06/2019  . Smokeless tobacco: Never Used  Vaping Use  . Vaping Use: Never used  Substance and Sexual Activity  . Alcohol use: Yes    Alcohol/week: 1.0 standard drink    Types: 1 Cans of beer per week    Comment: rare   . Drug use: Never  . Sexual activity: Not on file  Other Topics Concern  . Not on file  Social  History Narrative  . Not on file   Social Determinants of Health   Financial Resource Strain: Not on file  Food Insecurity: Not on file  Transportation Needs: Not on file  Physical Activity: Not on file  Stress: Not on file  Social Connections: Not on file  Intimate Partner Violence: Not At Risk  . Fear of Current or Ex-Partner: No  . Emotionally Abused: No  . Physically Abused: No  . Sexually Abused: No    Medications:   Current Outpatient Medications on File Prior to Visit  Medication Sig Dispense Refill  . apixaban (ELIQUIS) 5 MG TABS tablet Take 1 tablet (5 mg total) by mouth 2 (two) times daily. 180 tablet 1  . aspirin EC 81 MG EC tablet Take 1 tablet (81 mg total) by mouth daily.    Marland Kitchen atorvastatin (LIPITOR) 80 MG tablet Take 1 tablet (80 mg total) by mouth daily at 6 PM. 90  tablet 1  . Continuous Blood Gluc Receiver (FREESTYLE LIBRE 2 READER) DEVI As directed 1 each 0  . Continuous Blood Gluc Sensor (FREESTYLE LIBRE 2 SENSOR) MISC 1 Piece by Does not apply route every 14 (fourteen) days. 2 each 3  . ezetimibe (ZETIA) 10 MG tablet TAKE 1 TABLET (10 MG TOTAL) BY MOUTH DAILY. 90 tablet 3  . insulin aspart (NOVOLOG FLEXPEN) 100 UNIT/ML FlexPen Inject 8-14 Units into the skin 3 (three) times daily before meals. 30 mL 1  . insulin glargine (LANTUS SOLOSTAR) 100 UNIT/ML Solostar Pen Inject 40 Units into the skin at bedtime. 30 mL 1  . Insulin Pen Needle 32G X 4 MM MISC 1 Device by Does not apply route daily. 50 each 2  . levothyroxine (SYNTHROID) 50 MCG tablet TAKE 1 TABLET EVERY DAY 90 tablet 1  . lidocaine-prilocaine (EMLA) cream Apply 1 application topically daily as needed (prior to port being accessed (~every 3 months)).    . metoprolol tartrate (LOPRESSOR) 25 MG tablet Take 1 tablet (25 mg total) by mouth 2 (two) times daily. 180 tablet 3  . nitroGLYCERIN (NITROSTAT) 0.4 MG SL tablet Place 1 tablet (0.4 mg total) under the tongue every 5 (five) minutes as needed for chest pain.  100 tablet 3  . Omega-3 1000 MG CAPS Take 1,000 mg by mouth daily.    . ondansetron (ZOFRAN) 4 MG tablet Take by mouth.    . pantoprazole (PROTONIX) 20 MG tablet TAKE 1 TABLET EVERY DAY 90 tablet 1  . prochlorperazine (COMPAZINE) 10 MG tablet Take 10 mg by mouth every 6 (six) hours as needed.    . sertraline (ZOLOFT) 50 MG tablet TAKE 1 TABLET BY MOUTH DAILY (Patient taking differently: Take 50 mg by mouth daily.) 90 tablet 1  . tamsulosin (FLOMAX) 0.4 MG CAPS capsule Take 1 capsule (0.4 mg total) by mouth daily. 90 capsule 3  . traMADol (ULTRAM) 50 MG tablet Take 50 mg by mouth 4 (four) times daily as needed.    . vitamin B-12 (CYANOCOBALAMIN) 1000 MCG tablet Take 1,000 mcg by mouth daily.     Current Facility-Administered Medications on File Prior to Visit  Medication Dose Route Frequency Provider Last Rate Last Admin  . 0.9 %  sodium chloride infusion   Intravenous Continuous Derek Jack, MD 20 mL/hr at 12/30/18 1351 New Bag at 12/30/18 1351  . 0.9 %  sodium chloride infusion   Intravenous Continuous Derek Jack, MD 20 mL/hr at 12/30/18 1401 New Bag at 12/30/18 1401  . dextrose 5 % solution   Intravenous Once Derek Jack, MD      . heparin lock flush 100 unit/mL  500 Units Intracatheter Once PRN Derek Jack, MD      . sodium chloride flush (NS) 0.9 % injection 10 mL  10 mL Intracatheter PRN Derek Jack, MD   10 mL at 12/30/18 0911  . sodium chloride flush (NS) 0.9 % injection 10 mL  10 mL Intracatheter PRN Derek Jack, MD   10 mL at 05/21/19 0845    Allergies:   Allergies  Allergen Reactions  . Demerol [Meperidine Hcl] Other (See Comments)    convulsions    Today's Vitals   09/29/20 1324  BP: 96/60  Pulse: 78  Weight: 183 lb 3.2 oz (83.1 kg)  Height: 5\' 10"  (1.778 m)   Body mass index is 26.29 kg/m.  Physical Exam General: Pleasant frail middle-age Caucasian male seated, in no evident distress Head: head normocephalic and  atraumatic.  Neck: supple with no carotid or supraclavicular bruits Cardiovascular: regular rate and rhythm, no murmurs Musculoskeletal: no deformity Skin:  no rash/petichiae Vascular:  Normal pulses all extremities  Neurologic Exam Mental Status: Awake and fully alert.  Fluent speech and language.  Oriented to place and time. Recent and remote memory intact. Attention span, concentration and fund of knowledge appropriate. Mood and affect appropriate.  Cranial Nerves: Pupils equal, briskly reactive to light. Extraocular movements full without nystagmus. Visual fields full to confrontation. Hearing intact. Facial sensation intact. Face, tongue, palate moves normally and symmetrically.  Motor: Normal bulk and tone. Normal strength in all tested extremity muscles. Sensory.: intact to touch but diminished position, pinprick and vibratory sensation in sock distribution bilaterally as well as decreased pinprick sensory left hand distally with intact position Coordination: Rapid alternating movements normal in all extremities. Finger-to-nose and heel-to-shin performed accurately bilaterally. Gait and Station: Arises from chair without difficulty. Stance is broad-based gait demonstrates mild ataxia and broad-based.  Difficulty performing tandem walk. Reflexes: 1+ and symmetric in upper extremities and depressed in lower extremity.. Toes downgoing.      ASSESSMENT: 65 year old Caucasian male with history significant for episode of brief loss of consciousness possible prolonged syncope versus unwitnessed seizure resolved after initiating Keppra but recently had 2 additional presyncopal type events, and subacute embolic right temporal infarcts of cryptogenic etiology potentially related to hypercoagulability from pancreatic cancer.  Vascular risk factors of strokelike episode s/p TPA 01/2018, chronic left MCA stroke, hypertension, diabetes, hyperlipidemia,  pancreatic cancer s/p chemo (completed 09/2019), BLE  neuropathy, vertebral artery stenosis s/p angioplasty stenting 03/2018 and left ICA occlusion.  S/p CABGx4 on 01/26/2020 with DVT left subclavian shortly after discharge and initiated Eliquis in addition to aspirin and Plavix    PLAN:  1. History of multiple strokes:  a. Continue atorvastatin 80 mg daily and aspirin 81 mg daily and clopidogrel 75 mg daily for secondary stroke prevention and cardiac history (aspirin recently added a post CABG procedure by cardiology). b. Repeat MRI excluded metastatic disease c. 30-day cardiac event monitor negative for atrial fibrillation d. Close PCP, cardiology and endocrinology f/u for aggressive stroke risk factor management including HTN, HLD and DM  2. Syncope vs seizure:  -Resolved after initiating Keppra but had reoccurrence of presyncopal event 1 to 2 weeks ago seemingly unprovoked per report.  Episode yesterday of generalized weakness, fatigue and "seemig out of it" -Would recommend trialing Keppra XR dosage increase from 500 mg to 750 mg daily -Also advised to further discuss with cardiology to ensure no cardiac evaluation indicated -Prior EEG normal - will hold off repeat EEG at this time but will consider if additional events occur  3. Neuropathy a. Unknown etiology with history of uncontrolled DM, s/p chemo and radiation, B12 deficiency on supplementation, and chronic cervical stenosis b. 26-month worsening of L>R hand numbness/tingling with left hand cramping interfering with daily activity especially playing his guitar c. Obtain EMG/NCV for further evaluation and to rule out compressive type etiology d. Repeat vitamin B12 with known vitamin D deficiency e. Increase gabapentin from 300 mg twice daily to 300 mg 3 times daily  4. L VA stenosis s/p stenting and left ICA occlusion:  a. Advised to contact Dr. Arlean Hopping office to schedule repeat CTA head/neck which was recommended to be completed around 07/2020  5. LUE DVT:  a. Felt to be  associated with port  b. Per review of oncology note, recommended continuation of Eliquis until port removal     Follow-up in  6 months or call earlier if needed   CC:  [facility] provider: Dr. Broadus John, Edwinna Areola, MD    I spent a prolonged 50 minutes of face-to-face and non-face-to-face time with patient and fianc.  This included previsit chart review, lab review, study review, order entry, electronic health record documentation, patient education and discussion regarding prior strokes, syncopal versus seizure type event, ongoing use of AED, worsening neuropathy and potential etiologies, importance of managing stroke risk factors and answering all other questions to patient and fianc's satisfaction   Frann Rider, AGNP-BC  [facility] [address] [place], [place] [address]  Phone [phone] Fax [phone] Note: This document was prepared with digital dictation and possible smart phrase technology. Any transcriptional errors that result from this process are unintentional.

## 2020-09-30 LAB — VITAMIN B12: Vitamin B-12: 724 pg/mL (ref 232–1245)

## 2020-10-04 NOTE — Progress Notes (Signed)
I agree with the above plan 

## 2020-10-05 ENCOUNTER — Other Ambulatory Visit: Payer: Self-pay

## 2020-10-05 DIAGNOSIS — E1165 Type 2 diabetes mellitus with hyperglycemia: Secondary | ICD-10-CM

## 2020-10-05 MED ORDER — FREESTYLE LIBRE 2 READER DEVI: 0 refills | Status: DC

## 2020-10-05 MED ORDER — FREESTYLE LIBRE 2 SENSOR MISC: 1.0000 | 3 refills | Status: DC

## 2020-10-05 NOTE — Telephone Encounter (Signed)
Patient notified

## 2020-10-05 NOTE — Telephone Encounter (Signed)
Pt stopped by the office and now has new coverage, he has healt[employer] advantage. I have added this and he said it does cover the [place]. He would like that called into [facility].

## 2020-10-07 ENCOUNTER — Other Ambulatory Visit: Payer: Self-pay

## 2020-10-07 ENCOUNTER — Ambulatory Visit: Payer: HMO | Admitting: Cardiology

## 2020-10-07 ENCOUNTER — Encounter: Payer: Self-pay | Admitting: Cardiology

## 2020-10-07 VITALS — BP 112/60 | HR 80 | Ht 70.0 in | Wt 184.0 lb

## 2020-10-07 DIAGNOSIS — I25119 Atherosclerotic heart disease of native coronary artery with unspecified angina pectoris: Secondary | ICD-10-CM

## 2020-10-07 DIAGNOSIS — E782 Mixed hyperlipidemia: Secondary | ICD-10-CM | POA: Diagnosis not present

## 2020-10-07 DIAGNOSIS — Z79899 Other long term (current) drug therapy: Secondary | ICD-10-CM

## 2020-10-07 NOTE — Progress Notes (Signed)
Cardiology Office Note  Date: 10/07/2020   ID: Mark Foster, DOB 01/05/1956, MRN [id number]  PCP:  Benita StabileHall, John Z, MD  Cardiologist:  Nona DellSamuel Mae Denunzio, MD Electrophysiologist:  None   Chief Complaint  Patient presents with  . Cardiac follow-up    History of Present Illness: Mark Foster is a 65 y.o. male former patient of Dr. Purvis SheffieldKoneswaran now presenting to establish follow-up with me.  I reviewed his records and updated the chart.  He was last seen in September 2021 by Ms. Strader PA-C.  He is here today with his fiance.  He does not report any active angina symptoms or nitroglycerin use.  He has been dancing once or sometimes twice a week with his fiance, reports no unusual breathlessness.  Does still feel somewhat weak, has not regained all of his energy.  He continues to follow in the oncology clinic with Dr. Ellin SabaKatragadda, I reviewed the note from December.  He has pancreatic cancer status post surgical management and chemotherapy.  He remains on Eliquis with history of left upper extremity DVT likely associated with Port-A-Cath which remains in place.  I reviewed his medications which are outlined below.  We did talk about stopping Zetia, his last LDL was 44 and I suspect we could keep him at goal on high-dose Lipitor alone.  Past Medical History:  Diagnosis Date  . CAD (coronary artery disease)    a. 01/2020: CABG x4 with LIMA-LAD, SVG-OM, SVG-PDA and SVG-D1  . Carotid artery obstruction, left    Left ICA occlusion  . Cervical compression fracture (HCC)   . Cervical spinal stenosis   . Diabetic neuropathy (HCC)    Bilateral legs  . Diverticulitis   . Family history of pancreatic cancer   . History of kidney stones   . Hypothyroidism   . Pancreatic cancer (HCC)   . Stenosis of left vertebral artery   . 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 Type 2 diabetes mellitus (HCC)     Past Surgical History:  Procedure Laterality Date  . APPENDECTOMY    . BUBBLE STUDY N/A  11/18/2019   Procedure: BUBBLE STUDY;  Surgeon: Laqueta LindenKoneswaran, Suresh A, MD;  Location: [facility];  Service: Cardiology;  Laterality: N/A;  . CARDIAC SURGERY    . COLON RESECTION     For diverticulitis  . COLON SURGERY    . CORONARY ARTERY BYPASS GRAFT N/A 01/26/2020   Procedure: CORONARY ARTERY BYPASS GRAFTING (CABG) TIMES FOUR USING LEFT INTERNAL MAMMARY VEIN AND RIGHT GREATER SAPHENOUS VEIN;  Surgeon: Loreli SlotHendrickson, Steven C, MD;  Location: [facility];  Service: Open Heart Surgery;  Laterality: N/A;  . ENDOVEIN HARVEST OF GREATER SAPHENOUS VEIN Right 01/26/2020   Procedure: Mack GuiseEndovein Harvest Of Greater Saphenous Vein;  Surgeon: Loreli SlotHendrickson, Steven C, MD;  [facility];  Service: Open Heart Surgery;  Laterality: Right;  . ESOPHAGOGASTRODUODENOSCOPY N/A 10/03/2018   Procedure: ESOPHAGOGASTRODUODENOSCOPY (EGD);  Surgeon: Rachael FeeJacobs, Daniel P, MD;  [facility];  Service: Endoscopy;  Laterality: N/A;  . EUS N/A 10/03/2018   Procedure: UPPER ENDOSCOPIC ULTRASOUND (EUS) RADIAL;  Surgeon: Rachael FeeJacobs, Daniel P, MD;  Location: [facility];  Service: Endoscopy;  Laterality: N/A;  . EYE SURGERY Bilateral   . FINE NEEDLE ASPIRATION N/A 10/03/2018   Procedure: FINE NEEDLE ASPIRATION (FNA) LINEAR;  Surgeon: Rachael FeeJacobs, Daniel P, MD;  Location: [facility];  Service: Endoscopy;  Laterality: N/A;  . IR ANGIO INTRA EXTRACRAN SEL COM CAROTID INNOMINATE BILAT MOD SED  01/21/2018  . IR ANGIO INTRA  EXTRACRAN SEL COM CAROTID INNOMINATE UNI R MOD SED  03/21/2018  . IR ANGIO VERTEBRAL SEL VERTEBRAL BILAT MOD SED  01/21/2018  . IR TRANSCATH EXCRAN VERT OR CAR A STENT  03/21/2018  . LEFT HEART CATH AND CORONARY ANGIOGRAPHY N/A 01/08/2020   Procedure: LEFT HEART CATH AND CORONARY ANGIOGRAPHY;  Surgeon: Leonie Man, MD;  Location: [facility];  Service: Cardiovascular;  Laterality: N/A;  . PORTACATH PLACEMENT Left 12/23/2018   Procedure: INSERTION PORT-A-CATH;  Surgeon: Virl Cagey, MD;  Location: [facility];  Service:  General;  Laterality: Left;  . RADIOLOGY WITH ANESTHESIA N/A 03/21/2018   Procedure: IR WITH ANESTHESIA WITH STENT PLACEMENT;  Surgeon: Luanne Bras, MD;  Location: [facility];  Service: Radiology;  Laterality: N/A;  . ROTATOR CUFF REPAIR     Left  . SPLENECTOMY    . TEE WITHOUT CARDIOVERSION N/A 11/18/2019   Procedure: TRANSESOPHAGEAL ECHOCARDIOGRAM (TEE) WITH PROPOFOL;  Surgeon: Herminio Commons, MD;  Location: [facility];  Service: Cardiology;  Laterality: N/A;  . TEE WITHOUT CARDIOVERSION N/A 01/26/2020   Procedure: TRANSESOPHAGEAL ECHOCARDIOGRAM (TEE);  Surgeon: Melrose Nakayama, MD;  Location: [facility];  Service: Open Heart Surgery;  Laterality: N/A;  . TONSILLECTOMY      Current Outpatient Medications  Medication Sig Dispense Refill  . apixaban (ELIQUIS) 5 MG TABS tablet Take 1 tablet (5 mg total) by mouth 2 (two) times daily. 180 tablet 1  . aspirin EC 81 MG EC tablet Take 1 tablet (81 mg total) by mouth daily.    Marland Kitchen atorvastatin (LIPITOR) 80 MG tablet Take 1 tablet (80 mg total) by mouth daily at 6 PM. 90 tablet 1  . Continuous Blood Gluc Receiver (FREESTYLE LIBRE 2 READER) DEVI As directed 1 each 0  . Continuous Blood Gluc Sensor (FREESTYLE LIBRE 2 SENSOR) MISC 1 Piece by Does not apply route every 14 (fourteen) days. 2 each 3  . gabapentin (NEURONTIN) 300 MG capsule Take 1 capsule (300 mg total) by mouth 3 (three) times daily. 270 capsule 3  . insulin aspart (NOVOLOG FLEXPEN) 100 UNIT/ML FlexPen Inject 8-14 Units into the skin 3 (three) times daily before meals. 30 mL 1  . insulin glargine (LANTUS SOLOSTAR) 100 UNIT/ML Solostar Pen Inject 40 Units into the skin at bedtime. 30 mL 1  . Insulin Pen Needle 32G X 4 MM MISC 1 Device by Does not apply route daily. 50 each 2  . Levetiracetam (KEPPRA XR) 750 MG TB24 Take 1 tablet (750 mg total) by mouth daily. 90 tablet 3  . levothyroxine (SYNTHROID) 50 MCG tablet TAKE 1 TABLET EVERY DAY 90 tablet 1  . lidocaine-prilocaine (EMLA) cream  Apply 1 application topically daily as needed (prior to port being accessed (~every 3 months)).    . metoprolol tartrate (LOPRESSOR) 25 MG tablet Take 1 tablet (25 mg total) by mouth 2 (two) times daily. 180 tablet 3  . nitroGLYCERIN (NITROSTAT) 0.4 MG SL tablet Place 1 tablet (0.4 mg total) under the tongue every 5 (five) minutes as needed for chest pain. 100 tablet 3  . Omega-3 1000 MG CAPS Take 1,000 mg by mouth daily.    . ondansetron (ZOFRAN) 4 MG tablet Take by mouth.    . sertraline (ZOLOFT) 50 MG tablet TAKE 1 TABLET BY MOUTH DAILY (Patient taking differently: Take 50 mg by mouth daily.) 90 tablet 1  . tamsulosin (FLOMAX) 0.4 MG CAPS capsule Take 1 capsule (0.4 mg total) by mouth daily. 90 capsule 3  .  traMADol (ULTRAM) 50 MG tablet Take 50 mg by mouth 4 (four) times daily as needed.    . vitamin B-12 (CYANOCOBALAMIN) 1000 MCG tablet Take 1,000 mcg by mouth daily.     No current facility-administered medications for this visit.   Facility-Administered Medications Ordered in Other Visits  Medication Dose Route Frequency Provider Last Rate Last Admin  . 0.9 %  sodium chloride infusion   Intravenous Continuous Derek Jack, MD 20 mL/hr at 12/30/18 1351 New Bag at 12/30/18 1351  . 0.9 %  sodium chloride infusion   Intravenous Continuous Derek Jack, MD 20 mL/hr at 12/30/18 1401 New Bag at 12/30/18 1401  . dextrose 5 % solution   Intravenous Once Derek Jack, MD      . heparin lock flush 100 unit/mL  500 Units Intracatheter Once PRN Derek Jack, MD      . sodium chloride flush (NS) 0.9 % injection 10 mL  10 mL Intracatheter PRN Derek Jack, MD   10 mL at 12/30/18 0911  . sodium chloride flush (NS) 0.9 % injection 10 mL  10 mL Intracatheter PRN Derek Jack, MD   10 mL at 05/21/19 0845   Allergies:  Demerol [meperidine hcl]   ROS: No palpitations or syncope.  Physical Exam: VS:  BP 112/60   Pulse 80   Ht 5\' 10"  (1.778 m)   Wt 184 lb  (83.5 kg)   SpO2 95%   BMI 26.40 kg/m , BMI Body mass index is 26.4 kg/m.  Wt Readings from Last 3 Encounters:  10/07/20 184 lb (83.5 kg)  09/29/20 183 lb 3.2 oz (83.1 kg)  09/01/20 174 lb 12.8 oz (79.3 kg)    General: Patient appears comfortable at rest. HEENT: Conjunctiva and lids normal, wearing a mask. Neck: Supple, no elevated JVP or carotid bruits, no thyromegaly. Lungs: Clear to auscultation, nonlabored breathing at rest. Cardiac: Regular rate and rhythm, no S3 or significant systolic murmur, no pericardial rub. Extremities: No pitting edema.  ECG:  An ECG dated 05/18/2020 was personally reviewed today and demonstrated:  Normal sinus rhythm.  Recent Labwork: 01/27/2020: Magnesium 2.2 08/12/2020: ALT 25; AST 23; BUN 20; Creatinine, Ser 0.69; Hemoglobin 14.8; Platelets 222; Potassium 5.1; Sodium 134; TSH 1.661     Component Value Date/Time   CHOL 111 05/17/2020 1329   TRIG 90 05/17/2020 1329   HDL 50 05/17/2020 1329   CHOLHDL 2.2 05/17/2020 1329   VLDL 15 01/06/2020 1008   LDLCALC 44 05/17/2020 1329    Other Studies Reviewed Today:  TEE 11/18/2019: 1. Left ventricular ejection fraction, by estimation, is 60 to 65%. The  left ventricle has normal function. The left ventricle has no regional  wall motion abnormalities.  2. Right ventricular systolic function is normal. The right ventricular  size is normal.  3. No left atrial/left atrial appendage thrombus was detected.  4. The mitral valve is grossly normal. No evidence of mitral valve  regurgitation.  5. The aortic valve is tricuspid. Aortic valve regurgitation is not  visualized. No aortic stenosis is present.  6. Agitated saline contrast bubble study was negative, with no evidence  of any interatrial shunt.   Carotid Dopplers and ABIs 01/22/2020: Summary:  Right Carotid: Velocities in the right ICA are consistent with a 1-39%  stenosis.   Left Carotid: Evidence consistent with a total occlusion of the  left ICA.  Vertebrals: Bilateral vertebral arteries demonstrate antegrade flow.   Right ABI: Resting right ankle-brachial index is within normal range. No  evidence  of significant right lower extremity arterial disease.  Left ABI: Resting left ankle-brachial index indicates mild left lower  extremity arterial disease.  Right Upper Extremity: Doppler waveform obliterate with right radial  compression. Doppler waveform obliterate with right ulnar compression.  Left Upper Extremity: Doppler waveform obliterate with left radial  compression. Doppler waveforms remain within normal limits with left ulnar  compression.   Assessment and Plan:  1.  Multivessel CAD status post CABG in May 2021 as outlined above.  He is doing well without angina at this time on medical therapy.  Encouraged regular exercise plan.  Continue aspirin, Lipitor, Lopressor, and as needed nitroglycerin.  2.  Mixed hyperlipidemia.  Plan to stop Zetia at this time, I suspect we will be able to keep LDL at goal on high-dose Lipitor alone.  Check FLP for next visit.  3.  Pancreatic cancer, continue to follow with oncology.  4.  Left upper extremity DVT related to Port-A-Cath, he remains on Eliquis with follow-up by Dr. Delton Coombes.  Medication Adjustments/Labs and Tests Ordered: Current medicines are reviewed at length with the patient today.  Concerns regarding medicines are outlined above.   Tests Ordered: Orders Placed This Encounter  Procedures  . Lipid Profile    Medication Changes: No orders of the defined types were placed in this encounter.   Disposition:  Follow up 6 months in the [place] office.  Signed, Satira Sark, MD, [facility] 10/07/2020 2:12 PM    [place] [facility] [address] [place], [place] [address] Phone: [phone]; Fax: [phone]

## 2020-10-07 NOTE — Patient Instructions (Signed)
Medication Instructions:  Your physician has recommended you make the following change in your medication:   Stop Taking Zetia   *If you need a refill on your cardiac medications before your next appointment, please call your pharmacy*   Lab Work: Your physician recommends that you return for lab work in: 6 months just before your next visit   If you have labs (blood work) drawn today and your tests are completely normal, you will receive your results only by: Marland Kitchen MyChart Message (if you have MyChart) OR . A paper copy in the mail If you have any lab test that is abnormal or we need to change your treatment, we will call you to review the results.   Testing/Procedures: NONE   Follow-Up: At [facility], you and your health needs are our priority.  As part of our continuing mission to provide you with exceptional heart care, we have created designated Provider Care Teams.  These Care Teams include your primary Cardiologist (physician) and Advanced Practice Providers (APPs -  Physician Assistants and Nurse Practitioners) who all work together to provide you with the care you need, when you need it.  We recommend signing up for the patient portal called "MyChart".  Sign up information is provided on this After Visit Summary.  MyChart is used to connect with patients for Virtual Visits (Telemedicine).  Patients are able to view lab/test results, encounter notes, upcoming appointments, etc.  Non-urgent messages can be sent to your provider as well.   To learn more about what you can do with MyChart, go to [email or web address].    Your next appointment:   6 month(s)  The format for your next appointment:   In Person  Provider:   Rozann Lesches, MD   Other Instructions Thank you for choosing [facility]!

## 2020-10-19 ENCOUNTER — Ambulatory Visit: Payer: HMO | Admitting: Neurology

## 2020-10-19 ENCOUNTER — Ambulatory Visit (INDEPENDENT_AMBULATORY_CARE_PROVIDER_SITE_OTHER): Payer: HMO | Admitting: Neurology

## 2020-10-19 ENCOUNTER — Other Ambulatory Visit: Payer: Self-pay

## 2020-10-19 ENCOUNTER — Encounter: Payer: Self-pay | Admitting: Neurology

## 2020-10-19 DIAGNOSIS — R200 Anesthesia of skin: Secondary | ICD-10-CM

## 2020-10-19 DIAGNOSIS — G5603 Carpal tunnel syndrome, bilateral upper limbs: Secondary | ICD-10-CM

## 2020-10-19 DIAGNOSIS — R202 Paresthesia of skin: Secondary | ICD-10-CM

## 2020-10-19 HISTORY — DX: Carpal tunnel syndrome, bilateral upper limbs: G56.03

## 2020-10-19 NOTE — Procedures (Signed)
     HISTORY:  Mark Foster is a 65 year old gentleman with a history of numbness and discomfort in the hands and arms that has been present for several months.  The symptoms worsen when he tries to play the guitar.  He does have a history of some neck pain and a history of spinal stenosis, but no prior cervical spine surgery.  He is being evaluated for a possible neuropathy or a cervical radiculopathy.  NERVE CONDUCTION STUDIES:  Nerve conduction studies were performed on both upper extremities.  The distal motor latencies for the median nerves were prolonged bilaterally with low motor amplitudes see with these nerves bilaterally.  The distal motor latency and motor amplitudes for the left ulnar nerve were normal.  The nerve conduction velocities for the median nerves bilaterally and for the left ulnar nerve were normal.  The sensory latencies for the median nerves bilaterally were prolonged, normal for the left ulnar nerve.  The left ulnar F-wave latency was normal.  EMG STUDIES:  EMG study was performed on the left upper extremity:  The first dorsal interosseous muscle reveals 2 to 4 K units with full recruitment. No fibrillations or positive waves were noted. The abductor pollicis brevis muscle reveals 2 to 4 K units with slightly reduced recruitment. No fibrillations or positive waves were noted. The extensor indicis proprius muscle reveals 1 to 3 K units with full recruitment. No fibrillations or positive waves were noted. The pronator teres muscle reveals 2 to 3 K units with full recruitment. No fibrillations or positive waves were noted. The biceps muscle reveals 1 to 2 K units with full recruitment. No fibrillations or positive waves were noted. The triceps muscle reveals 2 to 5 K units with decreased recruitment. No fibrillations or positive waves were noted. The anterior deltoid muscle reveals 2 to 3 K units with full recruitment. No fibrillations or positive waves were noted. The  cervical paraspinal muscles were tested at 2 levels. No abnormalities of insertional activity were seen at either level tested. There was fair relaxation.   IMPRESSION:  Nerve conduction studies done on both upper extremities show evidence of bilateral carpal tunnel syndrome of moderate severity.  EMG evaluation of the left upper extremity shows findings consistent with carpal tunnel syndrome but there appears to be isolated chronic stable denervation seen in the left triceps muscle, could potentially be associated with a chronic C7 radiculopathy but clinical correlation is required.  Jill Alexanders MD 10/19/2020 3:32 PM  [facility] [address] [place], [place] [address]  Phone [phone] Fax [phone]

## 2020-10-19 NOTE — Progress Notes (Signed)
Please refer to EMG and nerve conduction procedure note.  

## 2020-10-19 NOTE — Progress Notes (Signed)
[place]    Nerve / Sites Muscle Latency Ref. Amplitude Ref. Rel Amp Segments Distance Velocity Ref. Area    ms ms mV mV %  cm m/s m/s mVms  L Median - APB     Wrist APB 4.9 ?4.4 2.0 ?4.0 100 Wrist - APB 7   6.8     Upper arm APB 8.5  3.7  185 Upper arm - Wrist 22 62 ?49 13.6     Ulnar Wrist APB 3.9  4.4  120 Ulnar Wrist - APB    11.0  R Median - APB     Wrist APB 4.9 ?4.4 3.8 ?4.0 100 Wrist - APB 7   16.0     Upper arm APB 9.8  3.7  96 Upper arm - Wrist 24 49 ?49 13.2  L Ulnar - ADM     Wrist ADM 3.0 ?3.3 7.6 ?6.0 100 Wrist - ADM 7   18.5     B.Elbow ADM 6.6  6.7  87.6 B.Elbow - Wrist 20 55 ?49 17.5     A.Elbow ADM 8.5  6.3  95 A.Elbow - B.Elbow 10 55 ?49 16.8           SNC    Nerve / Sites Rec. Site Peak Lat Ref.  Amp Ref. Segments Distance    ms ms V V  cm  L Median - Orthodromic (Dig II, Mid palm)     Dig II Wrist 4.5 ?3.4 3 ?10 Dig II - Wrist 13  R Median - Orthodromic (Dig II, Mid palm)     Dig II Wrist 4.3 ?3.4 3 ?10 Dig II - Wrist 13  L Ulnar - Orthodromic, (Dig V, Mid palm)     Dig V Wrist 3.1 ?3.1 2 ?5 Dig V - Wrist 68           F  Wave    Nerve F Lat Ref.   ms ms  L Ulnar - ADM 31.1 ?32.0

## 2020-10-22 ENCOUNTER — Other Ambulatory Visit (HOSPITAL_COMMUNITY): Payer: Self-pay | Admitting: Interventional Radiology

## 2020-10-22 DIAGNOSIS — I639 Cerebral infarction, unspecified: Secondary | ICD-10-CM

## 2020-10-25 ENCOUNTER — Telehealth: Payer: Self-pay | Admitting: *Deleted

## 2020-10-25 DIAGNOSIS — G5603 Carpal tunnel syndrome, bilateral upper limbs: Secondary | ICD-10-CM

## 2020-10-25 DIAGNOSIS — M5412 Radiculopathy, cervical region: Secondary | ICD-10-CM

## 2020-10-25 NOTE — Telephone Encounter (Signed)
-----   Message from Frann Rider, NP sent at 10/19/2020  5:03 PM EST ----- Please advise patient that recent EMG/NCV did show bilateral carpal tunnel syndrome -recommend referral to orthopedics for further evaluation and treatment options Also showed chronic C7 radiculopathy -prior MR lumbar completed 2019 but for R arm symptoms - as he is having left arm symptoms that have been persistent, consider repeating MR lumbar or referral to neurosurgery.   Please let me know if he would like these referrals

## 2020-10-25 NOTE — Telephone Encounter (Signed)
Referral placed to orthopedics for bilateral carpal tunnel syndrome as well as C7 radiculopathy. Will hold off on imaging until further evaluation with orthopedics completed

## 2020-10-25 NOTE — Telephone Encounter (Signed)
Spoke to pt and gave results to pt.  He is open to referral to orthopedics and also doing the MRI LS. He is ok for you to choose orthopeds.

## 2020-10-28 ENCOUNTER — Other Ambulatory Visit: Payer: Self-pay

## 2020-10-28 ENCOUNTER — Telehealth: Payer: Self-pay

## 2020-10-28 DIAGNOSIS — E1165 Type 2 diabetes mellitus with hyperglycemia: Secondary | ICD-10-CM

## 2020-10-28 MED ORDER — INSULIN PEN NEEDLE 32G X 4 MM MISC: 1.0000 | Freq: Every day | 2 refills | Status: AC

## 2020-10-28 NOTE — Telephone Encounter (Signed)
Patient requesting refill on Insulin Pen Needle 32G X 4 MM MISC and be sent to [facility], [place]. Thanks

## 2020-10-28 NOTE — Telephone Encounter (Signed)
Done

## 2020-11-03 ENCOUNTER — Ambulatory Visit (HOSPITAL_COMMUNITY)
Admission: RE | Admit: 2020-11-03 | Discharge: 2020-11-03 | Disposition: A | Discharge status: Home / Self Care | Payer: HMO | Source: Ambulatory Visit | Attending: Interventional Radiology | Admitting: Interventional Radiology

## 2020-11-03 ENCOUNTER — Encounter (HOSPITAL_COMMUNITY): Payer: Self-pay

## 2020-11-03 ENCOUNTER — Other Ambulatory Visit: Payer: Self-pay

## 2020-11-03 DIAGNOSIS — R200 Anesthesia of skin: Secondary | ICD-10-CM | POA: Diagnosis not present

## 2020-11-03 DIAGNOSIS — I639 Cerebral infarction, unspecified: Secondary | ICD-10-CM | POA: Diagnosis not present

## 2020-11-03 LAB — POCT I-STAT CREATININE: Creatinine, Ser: 0.8 mg/dL (ref 0.61–1.24)

## 2020-11-03 MED ORDER — IOHEXOL 350 MG/ML SOLN
100.0000 mL | Freq: Once | INTRAVENOUS | Status: AC | PRN
  Administered 2020-11-03: 75 mL via INTRAVENOUS

## 2020-11-08 ENCOUNTER — Telehealth (HOSPITAL_COMMUNITY): Payer: Self-pay

## 2020-11-08 NOTE — Telephone Encounter (Signed)
Pt agreed to f/u in 6 months with cta head/neck. AW 

## 2020-11-12 ENCOUNTER — Ambulatory Visit: Payer: HMO | Admitting: Orthopaedic Surgery

## 2020-11-12 ENCOUNTER — Other Ambulatory Visit: Payer: Self-pay

## 2020-11-12 VITALS — BP 118/70 | HR 71 | Ht 70.0 in | Wt 184.0 lb

## 2020-11-12 DIAGNOSIS — G5603 Carpal tunnel syndrome, bilateral upper limbs: Secondary | ICD-10-CM

## 2020-11-12 DIAGNOSIS — M542 Cervicalgia: Secondary | ICD-10-CM

## 2020-11-12 DIAGNOSIS — M4802 Spinal stenosis, cervical region: Secondary | ICD-10-CM | POA: Diagnosis not present

## 2020-11-12 NOTE — Progress Notes (Signed)
Office Visit Note   Patient: Mark Foster           Date of Birth: 04-15-1956           MRN: [id number] Visit Date: 11/12/2020              Requested by: Frann Rider, NP [phone] [address] [place],  [place] [address] PCP: Celene Squibb, MD   Assessment & Plan: Visit Diagnoses:  1. Bilateral carpal tunnel syndrome   2. Cervical stenosis of spinal canal     Plan: We will proceed with MRI scan for evaluation of his progressive cervical stenosis C4-C7.  He certainly does have carpal tunnel syndrome and we discussed that treatment options for his hand is and cervical spine problems.  Follow-up after MRI scan.  Follow-Up Instructions: No follow-ups on file.   Orders:  No orders of the defined types were placed in this encounter.  No orders of the defined types were placed in this encounter.     Procedures: No procedures performed   Clinical Data: No additional findings.   Subjective: Chief Complaint  Patient presents with  . Right Hand - Pain  . Left Hand - Pain  . Neck - Pain    HPI 65 year old male returns with ongoing problems with chronic neck pain.  He has had pain on and off for years but states it is gradually gotten worse.  Previous electrical test in February showed bilateral carpal tunnel syndrome and he was referred for evaluation.  Patient also had chronic C7 radiculopathy present on electrical test.  Past history of pancreatic adenoma cancer with the treatment which was in December 2019 and he currently is not under any treatment and is being watched for evidence of recurrence.  Past history of MRI scan several years ago which showed cervical stenosis C4-5 through C6-7 with varying degrees of central and foraminal stenosis.  His neck symptoms have progressed as well as hand numbness and he states he now is ready to proceed with treatment.  Review of Systems previous left MCA strokelike episode.  History of diabetes carpal tunnel.  Type 2 diabetes.   Hypothyroidism.  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  History of alcohol abuse.  Previous CABG x4.  All other systems noncontributory HPI.   Objective: Vital Signs: BP 118/70   Pulse 71   Ht 5\' 10"  (1.778 m)   Wt 184 lb (83.5 kg)   BMI 26.40 kg/m   Physical Exam Constitutional:      Appearance: He is well-developed.  HENT:     Head: Normocephalic and atraumatic.  Eyes:     Pupils: Pupils are equal, round, and reactive to light.  Neck:     Thyroid: No thyromegaly.     Trachea: No tracheal deviation.  Cardiovascular:     Rate and Rhythm: Normal rate.     Comments: Median sternotomy incision Pulmonary:     Effort: Pulmonary effort is normal.     Breath sounds: No wheezing.  Abdominal:     General: Bowel sounds are normal.     Palpations: Abdomen is soft.  Skin:    General: Skin is warm and dry.     Capillary Refill: Capillary refill takes less than 2 seconds.  Neurological:     Mental Status: He is alert and oriented to person, place, and time.  Psychiatric:        Behavior: Behavior normal.        Thought Content: Thought content normal.  Judgment: Judgment normal.     Ortho Exam positive carpal compression test worse on the left than right.  Positive Phalen's right and left.  Positive bilateral brachial plexus tenderness increased pain with cervical compression.  No lower extremity hyperreflexia.  No lower extremity clonus.neg Lhermitte.   Specialty Comments:  No specialty comments available.  Imaging: No results found.   PMFS History: Patient Active Problem List   Diagnosis Date Noted  . Bilateral carpal tunnel syndrome 10/19/2020  . 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 Abnormal cardiac CT angiography 01/08/2020  . Angina pectoris ([place])   . CVA (cerebral vascular accident) ([facility] [facility]) 10/08/2019  . Injury of left ankle 03/24/2019  . Port-A-Cath in place 12/19/2018  . Genetic testing 11/15/2018  . Loss of consciousness ([facility]) 11/12/2018  . Generalized  weakness 11/11/2018  . Family history of pancreatic cancer   . Hyperglycemia 10/09/2018  . Tobacco abuse counseling 10/09/2018  . Pancreatic adenocarcinoma ([facility]) 09/03/2018  . H/O ETOH abuse 09/03/2018  . Hyperbilirubinemia 09/03/2018  . 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 Uncontrolled type 2 diabetes mellitus with hyperglycemia ([facility]) 03/05/2018  . Hypothyroidism 03/05/2018  . Intracranial atherosclerosis 01/22/2018  . Spinal stenosis in cervical region   . Diabetes mellitus type 2 in nonobese (HCC)   . Left carotid artery occlusion 01/21/2018  . Cervical stenosis of spinal canal 01/21/2018  . Right hand weakness 01/21/2018  . Numbness of right hand 01/21/2018  . Essential hypertension, benign 01/21/2018  . Mixed hyperlipidemia 01/21/2018  . Diabetes ([facility]) 01/21/2018  . Hx of completed stroke 01/21/2018  . Leukopenia 01/21/2018  . Thrombocytopenia ([place]) 01/21/2018  . L MCA Stroke-like episode ([facility]) s/p IV tPA 01/19/2018   Past Medical History:  Diagnosis Date  . Bilateral carpal tunnel syndrome 10/19/2020  . CAD (coronary artery disease)    a. 01/2020: CABG x4 with LIMA-LAD, SVG-OM, SVG-PDA and SVG-D1  . Carotid artery obstruction, left    Left ICA occlusion  . Cervical compression fracture ([place])   . Cervical spinal stenosis   . Diabetic neuropathy (HCC)    Bilateral legs  . Diverticulitis   . Family history of pancreatic cancer   . History of kidney stones   . Hypothyroidism   . Pancreatic cancer ([place])   . Stenosis of left vertebral artery   . Stroke (cerebrum) ([place]) 10/09/2019  . Type 2 diabetes mellitus (HCC)     Family History  Problem Relation Age of Onset  . Stroke Mother   . Pancreatic cancer Father 58       d. 26  . Stroke Maternal Grandmother   . Heart attack Maternal Grandfather   . Heart Problems Paternal Grandfather   . Scoliosis Daughter   . Muscular dystrophy Grandson     Past Surgical History:  Procedure  Laterality Date  . APPENDECTOMY    . BUBBLE STUDY N/A 11/18/2019   Procedure: BUBBLE STUDY;  Surgeon: Herminio Commons, MD;  Location: [facility];  Service: Cardiology;  Laterality: N/A;  . CARDIAC SURGERY    . COLON RESECTION     For diverticulitis  . COLON SURGERY    . CORONARY ARTERY BYPASS GRAFT N/A 01/26/2020   Procedure: CORONARY ARTERY BYPASS GRAFTING (CABG) TIMES FOUR USING LEFT INTERNAL MAMMARY VEIN AND RIGHT GREATER SAPHENOUS VEIN;  Surgeon: Melrose Nakayama, MD;  Location: [facility];  Service: Open Heart Surgery;  Laterality: N/A;  . ENDOVEIN HARVEST OF GREATER SAPHENOUS VEIN Right 01/26/2020   Procedure: [place]  Of Greater Saphenous Vein;  Surgeon: Melrose Nakayama, MD;  Location: [facility];  Service: Open Heart Surgery;  Laterality: Right;  . ESOPHAGOGASTRODUODENOSCOPY N/A 10/03/2018   Procedure: ESOPHAGOGASTRODUODENOSCOPY (EGD);  Surgeon: [name], MD;  Location: [name];  Service: Endoscopy;  Laterality: N/A;  . EUS N/A 10/03/2018   Procedure: UPPER ENDOSCOPIC ULTRASOUND (EUS) RADIAL;  Surgeon: [name], MD;  Location: [facility];  Service: Endoscopy;  Laterality: N/A;  . EYE SURGERY Bilateral   . FINE NEEDLE ASPIRATION N/A 10/03/2018   Procedure: FINE NEEDLE ASPIRATION (FNA) LINEAR;  Surgeon: [name], MD;  Location: [facility];  Service: Endoscopy;  Laterality: N/A;  . IR ANGIO INTRA EXTRACRAN SEL COM CAROTID INNOMINATE BILAT MOD SED  01/21/2018  . IR ANGIO INTRA EXTRACRAN SEL COM CAROTID INNOMINATE UNI R MOD SED  03/21/2018  . IR ANGIO VERTEBRAL SEL VERTEBRAL BILAT MOD SED  01/21/2018  . IR TRANSCATH EXCRAN VERT OR CAR A STENT  03/21/2018  . LEFT HEART CATH AND CORONARY ANGIOGRAPHY N/A 01/08/2020   Procedure: LEFT HEART CATH AND CORONARY ANGIOGRAPHY;  Surgeon: Leonie Man, MD;  Location: [facility];  Service: Cardiovascular;  Laterality: N/A;  . PORTACATH PLACEMENT Left 12/23/2018   Procedure: INSERTION PORT-A-CATH;  Surgeon:  Virl Cagey, MD;  Location: [facility];  Service: General;  Laterality: Left;  . RADIOLOGY WITH ANESTHESIA N/A 03/21/2018   Procedure: IR WITH ANESTHESIA WITH STENT PLACEMENT;  Surgeon: Luanne Bras, MD;  Location: [facility];  Service: Radiology;  Laterality: N/A;  . ROTATOR CUFF REPAIR     Left  . SPLENECTOMY    . TEE WITHOUT CARDIOVERSION N/A 11/18/2019   Procedure: TRANSESOPHAGEAL ECHOCARDIOGRAM (TEE) WITH PROPOFOL;  Surgeon: Herminio Commons, MD;  Location: [facility];  Service: Cardiology;  Laterality: N/A;  . TEE WITHOUT CARDIOVERSION N/A 01/26/2020   Procedure: TRANSESOPHAGEAL ECHOCARDIOGRAM (TEE);  Surgeon: Melrose Nakayama, MD;  Location: [facility];  Service: Open Heart Surgery;  Laterality: N/A;  . TONSILLECTOMY     Social History   Occupational History  . Occupation: Estate agent  Tobacco Use  . Smoking status: Former Smoker    Types: Cigarettes    Start date: 10/06/2019  . Smokeless tobacco: Never Used  Vaping Use  . Vaping Use: Never used  Substance and Sexual Activity  . Alcohol use: Yes    Alcohol/week: 1.0 standard drink    Types: 1 Cans of beer per week    Comment: rare   . Drug use: Never  . Sexual activity: Not on file

## 2020-11-15 ENCOUNTER — Ambulatory Visit (HOSPITAL_COMMUNITY)
Admission: RE | Admit: 2020-11-15 | Discharge: 2020-11-15 | Disposition: A | Discharge status: Home / Self Care | Payer: HMO | Source: Ambulatory Visit | Attending: Hematology and Oncology | Admitting: Hematology and Oncology

## 2020-11-15 ENCOUNTER — Other Ambulatory Visit: Payer: Self-pay

## 2020-11-15 ENCOUNTER — Inpatient Hospital Stay (HOSPITAL_COMMUNITY): Payer: HMO | Attending: Hematology and Oncology

## 2020-11-15 ENCOUNTER — Other Ambulatory Visit (HOSPITAL_COMMUNITY): Payer: Medicare HMO

## 2020-11-15 DIAGNOSIS — I2510 Atherosclerotic heart disease of native coronary artery without angina pectoris: Secondary | ICD-10-CM | POA: Insufficient documentation

## 2020-11-15 DIAGNOSIS — Z7901 Long term (current) use of anticoagulants: Secondary | ICD-10-CM | POA: Insufficient documentation

## 2020-11-15 DIAGNOSIS — M47816 Spondylosis without myelopathy or radiculopathy, lumbar region: Secondary | ICD-10-CM | POA: Diagnosis not present

## 2020-11-15 DIAGNOSIS — Z951 Presence of aortocoronary bypass graft: Secondary | ICD-10-CM | POA: Diagnosis not present

## 2020-11-15 DIAGNOSIS — C259 Malignant neoplasm of pancreas, unspecified: Secondary | ICD-10-CM | POA: Insufficient documentation

## 2020-11-15 DIAGNOSIS — J432 Centrilobular emphysema: Secondary | ICD-10-CM | POA: Diagnosis not present

## 2020-11-15 DIAGNOSIS — K862 Cyst of pancreas: Secondary | ICD-10-CM | POA: Diagnosis not present

## 2020-11-15 DIAGNOSIS — C251 Malignant neoplasm of body of pancreas: Secondary | ICD-10-CM | POA: Insufficient documentation

## 2020-11-15 DIAGNOSIS — Z9221 Personal history of antineoplastic chemotherapy: Secondary | ICD-10-CM | POA: Insufficient documentation

## 2020-11-15 DIAGNOSIS — K8020 Calculus of gallbladder without cholecystitis without obstruction: Secondary | ICD-10-CM | POA: Diagnosis not present

## 2020-11-15 DIAGNOSIS — Z79899 Other long term (current) drug therapy: Secondary | ICD-10-CM | POA: Insufficient documentation

## 2020-11-15 DIAGNOSIS — Z7982 Long term (current) use of aspirin: Secondary | ICD-10-CM | POA: Diagnosis not present

## 2020-11-15 DIAGNOSIS — Z87891 Personal history of nicotine dependence: Secondary | ICD-10-CM | POA: Insufficient documentation

## 2020-11-15 DIAGNOSIS — Z9081 Acquired absence of spleen: Secondary | ICD-10-CM | POA: Diagnosis not present

## 2020-11-15 DIAGNOSIS — K8080 Other cholelithiasis without obstruction: Secondary | ICD-10-CM | POA: Diagnosis not present

## 2020-11-15 DIAGNOSIS — N281 Cyst of kidney, acquired: Secondary | ICD-10-CM | POA: Diagnosis not present

## 2020-11-15 DIAGNOSIS — Z794 Long term (current) use of insulin: Secondary | ICD-10-CM | POA: Insufficient documentation

## 2020-11-15 DIAGNOSIS — Z90411 Acquired partial absence of pancreas: Secondary | ICD-10-CM | POA: Diagnosis not present

## 2020-11-15 DIAGNOSIS — K429 Umbilical hernia without obstruction or gangrene: Secondary | ICD-10-CM | POA: Diagnosis not present

## 2020-11-15 DIAGNOSIS — Z86718 Personal history of other venous thrombosis and embolism: Secondary | ICD-10-CM | POA: Diagnosis not present

## 2020-11-15 DIAGNOSIS — G629 Polyneuropathy, unspecified: Secondary | ICD-10-CM | POA: Insufficient documentation

## 2020-11-15 LAB — COMPREHENSIVE METABOLIC PANEL
ALT: 21 U/L (ref 0–44)
AST: 21 U/L (ref 15–41)
Albumin: 3.9 g/dL (ref 3.5–5.0)
Alkaline Phosphatase: 64 U/L (ref 38–126)
Anion gap: 7 (ref 5–15)
BUN: 16 mg/dL (ref 8–23)
CO2: 25 mmol/L (ref 22–32)
Calcium: 8.7 mg/dL — ABNORMAL LOW (ref 8.9–10.3)
Chloride: 103 mmol/L (ref 98–111)
Creatinine, Ser: 0.62 mg/dL (ref 0.61–1.24)
GFR, Estimated: >60 mL/min (ref >60)
Glucose, Bld: 152 mg/dL — ABNORMAL HIGH (ref 70–99)
Potassium: 3.9 mmol/L (ref 3.5–5.1)
Sodium: 135 mmol/L (ref 135–145)
Total Bilirubin: 1.1 mg/dL (ref 0.3–1.2)
Total Protein: 7.1 g/dL (ref 6.5–8.1)

## 2020-11-15 LAB — CBC WITH DIFFERENTIAL/PLATELET
Abs Immature Granulocytes: 0.01 10*3/uL (ref 0.00–0.07)
Basophils Absolute: 0 10*3/uL (ref 0.0–0.1)
Basophils Relative: 1 %
Eosinophils Absolute: 0.1 10*3/uL (ref 0.0–0.5)
Eosinophils Relative: 4 %
HCT: 42.7 % (ref 39.0–52.0)
Hemoglobin: 14.3 g/dL (ref 13.0–17.0)
Immature Granulocytes: 0 %
Lymphocytes Relative: 40 %
Lymphs Abs: 1.4 10*3/uL (ref 0.7–4.0)
MCH: 32.5 pg (ref 26.0–34.0)
MCHC: 33.5 g/dL (ref 30.0–36.0)
MCV: 97 fL (ref 80.0–100.0)
Monocytes Absolute: 0.3 10*3/uL (ref 0.1–1.0)
Monocytes Relative: 8 %
Neutro Abs: 1.6 10*3/uL — ABNORMAL LOW (ref 1.7–7.7)
Neutrophils Relative %: 47 %
Platelets: 198 10*3/uL (ref 150–400)
RBC: 4.4 MIL/uL (ref 4.22–5.81)
RDW: 15.8 % — ABNORMAL HIGH (ref 11.5–15.5)
WBC: 3.5 10*3/uL — ABNORMAL LOW (ref 4.0–10.5)
nRBC: 0 % (ref 0.0–0.2)

## 2020-11-15 MED ORDER — IOHEXOL 300 MG/ML  SOLN
100.0000 mL | Freq: Once | INTRAMUSCULAR | Status: AC | PRN
  Administered 2020-11-15: 100 mL via INTRAVENOUS

## 2020-11-15 MED ORDER — HEPARIN SOD (PORK) LOCK FLUSH 100 UNIT/ML IV SOLN
INTRAVENOUS | Status: AC
  Administered 2020-11-15: 500 [IU]
  Filled 2020-11-15: qty 5

## 2020-11-16 LAB — CANCER ANTIGEN 19-9: CA 19-9: 10 U/mL (ref 0–35)

## 2020-11-19 ENCOUNTER — Ambulatory Visit
Admission: RE | Admit: 2020-11-19 | Discharge: 2020-11-19 | Disposition: A | Discharge status: Home / Self Care | Payer: HMO | Source: Ambulatory Visit | Attending: Orthopaedic Surgery | Admitting: Orthopaedic Surgery

## 2020-11-19 ENCOUNTER — Other Ambulatory Visit: Payer: Self-pay

## 2020-11-19 DIAGNOSIS — M542 Cervicalgia: Secondary | ICD-10-CM

## 2020-11-19 DIAGNOSIS — M4802 Spinal stenosis, cervical region: Secondary | ICD-10-CM | POA: Diagnosis not present

## 2020-11-22 ENCOUNTER — Encounter (HOSPITAL_COMMUNITY): Payer: Medicare HMO

## 2020-11-22 ENCOUNTER — Other Ambulatory Visit: Payer: Self-pay

## 2020-11-22 ENCOUNTER — Inpatient Hospital Stay (HOSPITAL_COMMUNITY): Payer: HMO | Admitting: Hematology

## 2020-11-22 VITALS — BP 120/59 | HR 75 | Temp 97.5°F | Resp 18 | Wt 195.5 lb

## 2020-11-22 DIAGNOSIS — C251 Malignant neoplasm of body of pancreas: Secondary | ICD-10-CM | POA: Diagnosis not present

## 2020-11-22 DIAGNOSIS — C259 Malignant neoplasm of pancreas, unspecified: Secondary | ICD-10-CM | POA: Diagnosis not present

## 2020-11-22 NOTE — Patient Instructions (Signed)
[place] at [facility] Discharge Instructions  You were seen today by Dr. Delton Coombes. He went over your recent results and scans. Continue getting your port flushed every 3 months. You will be scheduled to have a CT scan of your chest and abdomen before your next visit. Dr. Delton Coombes will see you back in 6 months for labs and follow up.   Thank you for choosing [place] at [facility] to provide your oncology and hematology care.  To afford each patient quality time with our provider, please arrive at least 15 minutes before your scheduled appointment time.   If you have a lab appointment with the [facility] please come in thru the Main Entrance and check in at the main information desk  You need to re-schedule your appointment should you arrive 10 or more minutes late.  We strive to give you quality time with our providers, and arriving late affects you and other patients whose appointments are after yours.  Also, if you no show three or more times for appointments you may be dismissed from the clinic at the providers discretion.     Again, thank you for choosing [facility].  Our hope is that these requests will decrease the amount of time that you wait before being seen by our physicians.       _____________________________________________________________  Should you have questions after your visit to [facility], please contact our office at [phone] between the hours of 8:00 a.m. and 4:30 p.m.  Voicemails left after 4:00 p.m. will not be returned until the following business day.  For prescription refill requests, have your pharmacy contact our office and allow 72 hours.    Cancer Center Support Programs:   > Cancer Support Group  2nd Tuesday of the month 1pm-2pm, Journey Room

## 2020-11-22 NOTE — Progress Notes (Signed)
[facility] [address] [place] [address]   CLINIC:  Medical Oncology/Hematology  PCP:  Celene Squibb, MD [address] [place] [address] [phone]   REASON FOR VISIT:  Follow-up for pancreatic adenocarcinoma  PRIOR THERAPY:  1. Distal pancreatectomy and splenectomy at [facility] on 11/20/2018. 2. Adjuvant FOLFIRINOX x 12 cycles from 12/30/2018 to 06/04/2019.  NGS Results: BRCA 1 heterozygous VUS  CURRENT THERAPY: Surveillance  BRIEF ONCOLOGIC HISTORY:  Oncology History  Pancreatic adenocarcinoma ([place])  08/20/2018 Imaging   CT abdomen/pelvis w/ contrast: IMPRESSION: Atrophy and ductal dilatation involving the pancreatic tail, with suspected small soft tissue mass in the pancreatic body which could represent pancreatic carcinoma. Abdomen MRI and MRCP without and with contrast is recommended for further evaluation.  No evidence of hepatobiliary disease.   08/30/2018 Imaging   MRI abdomen w/ contrast: IMPRESSION: 1. Although not definitive, there remains concern of a small hypoenhancing mass at the junction of the pancreatic body and tail associated with atrophy and ductal dilatation in the pancreatic tail. This remains concerning for pancreatic neoplasm. Postinflammatory stricture less likely. Besides a tiny cystic lesion in the pancreatic tail, there are no other signs of previous pancreatitis. Further evaluation with endoscopic ultrasound for possible biopsy strongly recommended. 2. No evidence of metastatic disease. 3. Cholelithiasis without evidence of cholecystitis or biliary dilatation.   09/03/2018 Initial Diagnosis   Pancreatic adenocarcinoma ([place])   10/03/2018 Procedure   EUS: - 2.6cm irregularly shaped mass in the body of the pancreas that causing main pancreatic duct obstruction and dilation. The mass abuts the splenic vessels but no other significant vascular structures and it was sampled with trangastric EUS FNA.  Preliminary cytology is + for malignancy, likely well-differentiated adenocarcinoma. It appears surgically resectable.   10/03/2018 Pathology Results   Accession: [id number]  FINE NEEDLE ASPIRATION, ENDOSCOPIC, PANCREAS BODY (SPECIMEN 1 OF 1 COLLECTED 10/03/18): ADENOCARCINOMA.   10/17/2018 Imaging   PET: IMPRESSION: 1. Tiny focus of hypermetabolism identified in the body of pancreas, adjacent to the abrupt cut off of the main pancreatic duct. No evidence for hypermetabolic metastatic disease in the neck, chest, abdomen, or pelvis. 2. Cholelithiasis. 3.  Aortic Atherosclerois (ICD10-170.0) 4. 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  The Hereditary Gene Panel offered by [facility] includes sequencing and/or deletion duplication testing of the following 47 genes: APC, ATM, AXIN2, BARD1, BMPR1A, BRCA1, BRCA2, BRIP1, CDH1, CDK4, CDKN2A (p14ARF), CDKN2A (p16INK4a), CHEK2, CTNNA1, DICER1, EPCAM (Deletion/duplication testing only), GREM1 (promoter region deletion/duplication testing only), KIT, MEN1, MLH1, MSH2, MSH3, MSH6, MUTYH, NBN, NF1, NHTL1, PALB2, PDGFRA, PMS2, POLD1, POLE, PTEN, RAD50, RAD51C, RAD51D, SDHB, SDHC, SDHD, SMAD4, SMARCA4. STK11, TP53, TSC1, TSC2, and VHL.  The following genes were evaluated for sequence changes only: SDHA and HOXB13 c.251G>A variant only. The report date is November 12, 2018.  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 The change in variant classification was made as a result of re-review of the evidence in light of new variant interpretation guidelines and/or new information. The amended report date is March 11, 2020.    11/20/2018 Surgery   Distal subtotal pancreatectomy with splenectomy at [facility]   11/20/2018 Surgery   TUMOR   Tumor Site:  Pancreatic body    Histologic Type:  Ductal adenocarcinoma    Histologic Grade:   G1: Well differentiated    Tumor Size:  Greatest  dimension in Centimeters (cm): 2.8 Centimeters (cm)    Additional Dimension in Centimeters (cm):  2.7 Centimeters (cm)    Additional Dimension in Centimeters (cm):  1.8 Centimeters (cm)   Tumor Extent:      Tumor Extension:  Tumor is confined to pancreas    Accessory Findings:      Treatment Effect:  No known presurgical therapy     Lymphovascular Invasion:  Not identified     Perineural Invasion:  Not identified   MARGINS   Margins:      Proximal Pancreatic Parenchymal Margin:  Uninvolved by invasive carcinoma and pancreatic high-grade intraepithelial neoplasia      Distance of Invasive Carcinoma from Margin:  0.6 Centimeters (cm)   :      Other Margin:  Splenic margin.      Margin Status:  Uninvolved by invasive carcinoma   LYMPH NODES  Number of Lymph Nodes Involved:  2   Number of Lymph Nodes Examined:  16   PATHOLOGIC STAGE CLASSIFICATION (pTNM, AJCC 8th Edition)  TNM Descriptors:  Not applicable   Primary Tumor (pT):  pT2   Regional Lymph Nodes (pN):  pN1   ADDITIONAL FINDINGS  Additional Pathologic Findings:  Pancreatic intraepithelial neoplasia    Highest Grade (PanIN):  High grade PanIN is present.   Additional Pathologic Findings:  Benign unilocular pancreatic cyst (1.3 cm in greatest dimension) at the pancreatic tail.   12/30/2018 -  Chemotherapy   The patient had palonosetron (ALOXI) injection 0.25 mg, 0.25 mg, Intravenous,  Once, 12 of 12 cycles Administration: 0.25 mg (12/30/2018), 0.25 mg (01/13/2019), 0.25 mg (01/28/2019), 0.25 mg (02/11/2019), 0.25 mg (02/25/2019), 0.25 mg (03/11/2019), 0.25 mg (03/25/2019), 0.25 mg (04/09/2019), 0.25 mg (04/23/2019), 0.25 mg (05/07/2019), 0.25 mg (05/21/2019), 0.25 mg (06/04/2019) pegfilgrastim (NEULASTA) injection 6 mg, 6 mg, Subcutaneous, Once, 7 of 7 cycles Administration: 6 mg (01/01/2019), 6 mg (01/15/2019), 6 mg  (01/30/2019), 6 mg (02/13/2019), 6 mg (02/27/2019), 6 mg (03/13/2019), 6 mg (03/27/2019) pegfilgrastim-jmdb (FULPHILA) injection 6 mg, 6 mg, Subcutaneous,  Once, 5 of 5 cycles Administration: 6 mg (04/11/2019), 6 mg (04/25/2019), 6 mg (05/09/2019), 6 mg (05/23/2019), 6 mg (06/06/2019) irinotecan (CAMPTOSAR) 280 mg in sodium chloride 0.9 % 500 mL chemo infusion, 150 mg/m2 = 280 mg (100 % of original dose 150 mg/m2), Intravenous,  Once, 12 of 12 cycles Dose modification: 150 mg/m2 (original dose 150 mg/m2, Cycle 1, Reason: Provider Judgment) Administration: 280 mg (12/30/2018), 280 mg (01/13/2019), 280 mg (01/28/2019), 280 mg (02/11/2019), 280 mg (02/25/2019), 280 mg (03/11/2019), 280 mg (03/25/2019), 280 mg (04/09/2019), 280 mg (04/23/2019), 280 mg (05/07/2019), 280 mg (05/21/2019), 280 mg (06/04/2019) leucovorin 700 mg in sodium chloride 0.9 % 250 mL infusion, 744 mg, Intravenous,  Once, 12 of 12 cycles Administration: 700 mg (12/30/2018), 700 mg (01/13/2019), 700 mg (01/28/2019), 700 mg (02/11/2019), 700 mg (02/25/2019), 700 mg (03/11/2019), 700 mg (03/25/2019), 700 mg (04/09/2019), 700 mg (04/23/2019), 700 mg (05/07/2019), 700 mg (05/21/2019), 700 mg (06/04/2019) oxaliplatin (ELOXATIN) 120 mg in dextrose 5 % 500 mL chemo infusion, 63.75 mg/m2 = 120 mg (75 % of original dose 85 mg/m2), Intravenous,  Once, 12 of 12 cycles Dose modification: 63.75 mg/m2 (75 % of original dose 85 mg/m2, Cycle 1, Reason: Other (see comments), Comment: high risk for worsening neuropathy), 42.5 mg/m2 (50 % of original dose 85 mg/m2, Cycle 11, Reason: Other (see comments), Comment: neuropathy) Administration: 120 mg (12/30/2018), 120 mg (01/13/2019), 120 mg (01/28/2019), 120 mg (02/11/2019), 120 mg (02/25/2019), 120 mg (03/11/2019), 120  mg (03/25/2019), 120 mg (04/09/2019), 120 mg (04/23/2019), 120 mg (05/07/2019), 80 mg (05/21/2019), 80 mg (06/04/2019) fosaprepitant (EMEND) 150 mg, dexamethasone (DECADRON) 12 mg in sodium chloride 0.9 % 145 mL IVPB, , Intravenous,  Once, 12 of 12  cycles Administration:  (12/30/2018),  (01/13/2019),  (01/28/2019),  (02/11/2019),  (02/25/2019),  (03/11/2019),  (03/25/2019),  (04/09/2019),  (04/23/2019),  (05/07/2019),  (05/21/2019),  (06/04/2019) fluorouracil (ADRUCIL) 4,450 mg in sodium chloride 0.9 % 61 mL chemo infusion, 2,400 mg/m2 = 4,450 mg, Intravenous, 1 Day/Dose, 12 of 12 cycles Administration: 4,450 mg (12/30/2018), 4,450 mg (01/13/2019), 4,450 mg (01/28/2019), 4,450 mg (02/11/2019), 4,450 mg (02/25/2019), 4,450 mg (03/11/2019), 4,450 mg (03/25/2019), 4,450 mg (04/09/2019), 4,450 mg (04/23/2019), 4,450 mg (05/07/2019), 4,450 mg (05/21/2019), 4,450 mg (06/04/2019)  for chemotherapy treatment.      CANCER STAGING: Cancer Staging No matching staging information was found for the patient.  INTERVAL HISTORY:  Mark Foster, a 65 y.o. male, returns for routine follow-up of his pancreatic adenocarcinoma. Mark Foster was last seen on 08/16/2020.   Today he is accompanied by his wife and he reports feeling well. 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 Lorin Mercy is following his cervical stenosis and carpal tunnel. He continues taking Eliquis BID and denies having abdominal pain. He has an incisional hernia which hurts rarely and reduces when he reclines. He denies having balance issues and has been ambulating without his cane.   REVIEW OF SYSTEMS:  Review of Systems  Constitutional: Positive for fatigue (75%). Negative for appetite change.  Gastrointestinal: Negative for abdominal pain.  Neurological: Positive for numbness (tingling in hands).  All other systems reviewed and are negative.   PAST MEDICAL/SURGICAL HISTORY:  Past Medical History:  Diagnosis Date  . Bilateral carpal tunnel syndrome 10/19/2020  . CAD (coronary artery disease)    a. 01/2020: CABG x4 with LIMA-LAD, SVG-OM, SVG-PDA and SVG-D1  . Carotid artery obstruction, left    Left ICA  occlusion  . Cervical compression fracture ([place])   . Cervical spinal stenosis   . Diabetic neuropathy ([facility])    Bilateral legs  . Diverticulitis   . Family history of pancreatic cancer   . History of kidney stones   . Hypothyroidism   . Pancreatic cancer ([place])   . Stenosis of left vertebral artery   . 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 Type 2 diabetes mellitus ([place])    Past Surgical History:  Procedure Laterality Date  . APPENDECTOMY    . BUBBLE STUDY N/A 11/18/2019   Procedure: BUBBLE STUDY;  Surgeon: Herminio Commons, MD;  Location: [facility];  Service: Cardiology;  Laterality: N/A;  . CARDIAC SURGERY    . COLON RESECTION     For diverticulitis  . COLON SURGERY    . CORONARY ARTERY BYPASS GRAFT N/A 01/26/2020   Procedure: CORONARY ARTERY BYPASS GRAFTING (CABG) TIMES FOUR USING LEFT INTERNAL MAMMARY VEIN AND RIGHT GREATER SAPHENOUS VEIN;  Surgeon: Melrose Nakayama, MD;  Location: [facility];  Service: Open Heart Surgery;  Laterality: N/A;  . ENDOVEIN HARVEST OF GREATER SAPHENOUS VEIN Right 01/26/2020   Procedure: [place] Of Greater Saphenous Vein;  Surgeon: Melrose Nakayama, MD;  Location: [facility];  Service: Open Heart Surgery;  Laterality: Right;  . ESOPHAGOGASTRODUODENOSCOPY N/A 10/03/2018   Procedure: ESOPHAGOGASTRODUODENOSCOPY (EGD);  Surgeon: [name], MD;  Location: [name];  Service: Endoscopy;  Laterality: N/A;  . EUS N/A 10/03/2018   Procedure: UPPER ENDOSCOPIC ULTRASOUND (EUS) RADIAL;  Surgeon: [name], MD;  Location: [facility];  Service: Endoscopy;  Laterality: N/A;  . EYE SURGERY Bilateral   . FINE NEEDLE ASPIRATION N/A 10/03/2018   Procedure: FINE NEEDLE ASPIRATION (FNA) LINEAR;  Surgeon: [name], MD;  Location: [facility];  Service: Endoscopy;  Laterality: N/A;  . IR ANGIO INTRA EXTRACRAN SEL COM CAROTID INNOMINATE BILAT MOD SED  01/21/2018  . IR ANGIO INTRA EXTRACRAN SEL COM CAROTID INNOMINATE UNI R MOD SED  03/21/2018  .  IR ANGIO VERTEBRAL SEL VERTEBRAL BILAT MOD SED  01/21/2018  . IR TRANSCATH EXCRAN VERT OR CAR A STENT  03/21/2018  . LEFT HEART CATH AND CORONARY ANGIOGRAPHY N/A 01/08/2020   Procedure: LEFT HEART CATH AND CORONARY ANGIOGRAPHY;  Surgeon: Leonie Man, MD;  Location: [facility];  Service: Cardiovascular;  Laterality: N/A;  . PORTACATH PLACEMENT Left 12/23/2018   Procedure: INSERTION PORT-A-CATH;  Surgeon: Virl Cagey, MD;  Location: [facility];  Service: General;  Laterality: Left;  . RADIOLOGY WITH ANESTHESIA N/A 03/21/2018   Procedure: IR WITH ANESTHESIA WITH STENT PLACEMENT;  Surgeon: Luanne Bras, MD;  Location: [facility];  Service: Radiology;  Laterality: N/A;  . ROTATOR CUFF REPAIR     Left  . SPLENECTOMY    . TEE WITHOUT CARDIOVERSION N/A 11/18/2019   Procedure: TRANSESOPHAGEAL ECHOCARDIOGRAM (TEE) WITH PROPOFOL;  Surgeon: Herminio Commons, MD;  Location: [facility];  Service: Cardiology;  Laterality: N/A;  . TEE WITHOUT CARDIOVERSION N/A 01/26/2020   Procedure: TRANSESOPHAGEAL ECHOCARDIOGRAM (TEE);  Surgeon: Melrose Nakayama, MD;  Location: [facility];  Service: Open Heart Surgery;  Laterality: N/A;  . TONSILLECTOMY      SOCIAL HISTORY:  Social History   Socioeconomic History  . Marital status: Married    Spouse name: Not on file  . Number of children: 2  . Years of education: Not on file  . Highest education level: Not on file  Occupational History  . Occupation: Estate agent  Tobacco Use  . Smoking status: Former Smoker    Types: Cigarettes    Start date: 10/06/2019  . Smokeless tobacco: Never Used  Vaping Use  . Vaping Use: Never used  Substance and Sexual Activity  . Alcohol use: Yes    Alcohol/week: 1.0 standard drink    Types: 1 Cans of beer per week    Comment: rare   . Drug use: Never  . Sexual activity: Not on file  Other Topics Concern  . Not on file  Social History Narrative  . Not on file   Social Determinants of Health   Financial  Resource Strain: Not on file  Food Insecurity: Not on file  Transportation Needs: Not on file  Physical Activity: Not on file  Stress: Not on file  Social Connections: Not on file  Intimate Partner Violence: Not At Risk  . Fear of Current or Ex-Partner: No  . Emotionally Abused: No  . Physically Abused: No  . Sexually Abused: No    FAMILY HISTORY:  Family History  Problem Relation Age of Onset  . Stroke Mother   . Pancreatic cancer Father 60       d. 74  . Stroke Maternal Grandmother   . Heart attack Maternal Grandfather   . Heart Problems Paternal Grandfather   . Scoliosis Daughter   . Muscular dystrophy Grandson     CURRENT MEDICATIONS:  Current Outpatient  Medications  Medication Sig Dispense Refill  . aspirin EC 81 MG EC tablet Take 1 tablet (81 mg total) by mouth daily.    Marland Kitchen atorvastatin (LIPITOR) 80 MG tablet Take 1 tablet (80 mg total) by mouth daily at 6 PM. 90 tablet 1  . Continuous Blood Gluc Receiver (FREESTYLE LIBRE 2 READER) DEVI As directed 1 each 0  . Continuous Blood Gluc Sensor (FREESTYLE LIBRE 2 SENSOR) MISC 1 Piece by Does not apply route every 14 (fourteen) days. 2 each 3  . gabapentin (NEURONTIN) 300 MG capsule Take 1 capsule (300 mg total) by mouth 3 (three) times daily. 270 capsule 3  . insulin aspart (NOVOLOG FLEXPEN) 100 UNIT/ML FlexPen Inject 8-14 Units into the skin 3 (three) times daily before meals. 30 mL 1  . insulin glargine (LANTUS SOLOSTAR) 100 UNIT/ML Solostar Pen Inject 40 Units into the skin at bedtime. 30 mL 1  . Insulin Pen Needle 32G X 4 MM MISC 1 Device by Does not apply route daily. Use as directed to inject insulin four times daily 200 each 2  . Levetiracetam (KEPPRA XR) 750 MG TB24 Take 1 tablet (750 mg total) by mouth daily. 90 tablet 3  . levothyroxine (SYNTHROID) 50 MCG tablet TAKE 1 TABLET EVERY DAY 90 tablet 1  . lidocaine-prilocaine (EMLA) cream Apply 1 application topically daily as needed (prior to port being accessed (~every 3  months)).    . nitroGLYCERIN (NITROSTAT) 0.4 MG SL tablet Place 1 tablet (0.4 mg total) under the tongue every 5 (five) minutes as needed for chest pain. 100 tablet 3  . Omega-3 1000 MG CAPS Take 1,000 mg by mouth daily.    . ondansetron (ZOFRAN) 4 MG tablet Take by mouth.    . sertraline (ZOLOFT) 50 MG tablet TAKE 1 TABLET BY MOUTH DAILY (Patient taking differently: Take 50 mg by mouth daily.) 90 tablet 1  . tamsulosin (FLOMAX) 0.4 MG CAPS capsule Take 1 capsule (0.4 mg total) by mouth daily. 90 capsule 3  . traMADol (ULTRAM) 50 MG tablet Take 50 mg by mouth 4 (four) times daily as needed.    . vitamin B-12 (CYANOCOBALAMIN) 1000 MCG tablet Take 1,000 mcg by mouth daily.    Marland Kitchen apixaban (ELIQUIS) 5 MG TABS tablet Take 1 tablet (5 mg total) by mouth 2 (two) times daily. 180 tablet 1  . metoprolol tartrate (LOPRESSOR) 25 MG tablet Take 1 tablet (25 mg total) by mouth 2 (two) times daily. 180 tablet 3   No current facility-administered medications for this visit.   Facility-Administered Medications Ordered in Other Visits  Medication Dose Route Frequency Provider Last Rate Last Admin  . 0.9 %  sodium chloride infusion   Intravenous Continuous Derek Jack, MD 20 mL/hr at 12/30/18 1351 New Bag at 12/30/18 1351  . 0.9 %  sodium chloride infusion   Intravenous Continuous Derek Jack, MD 20 mL/hr at 12/30/18 1401 New Bag at 12/30/18 1401  . dextrose 5 % solution   Intravenous Once Derek Jack, MD      . heparin lock flush 100 unit/mL  500 Units Intracatheter Once PRN Derek Jack, MD      . sodium chloride flush (NS) 0.9 % injection 10 mL  10 mL Intracatheter PRN Derek Jack, MD   10 mL at 12/30/18 0911  . sodium chloride flush (NS) 0.9 % injection 10 mL  10 mL Intracatheter PRN Derek Jack, MD   10 mL at 05/21/19 0845    ALLERGIES:  Allergies  Allergen Reactions  .  Demerol [Meperidine Hcl] Other (See Comments)    convulsions    PHYSICAL EXAM:   Performance status (ECOG): 1 - Symptomatic but completely ambulatory  Vitals:   11/22/20 1424  BP: (!) 120/59  Pulse: 75  Resp: 18  Temp: (!) 97.5 F (36.4 C)  SpO2: 97%   Wt Readings from Last 3 Encounters:  11/22/20 195 lb 8 oz (88.7 kg)  11/12/20 184 lb (83.5 kg)  10/07/20 184 lb (83.5 kg)   Physical Exam Vitals reviewed.  Constitutional:      Appearance: Normal appearance.  Cardiovascular:     Rate and Rhythm: Normal rate and regular rhythm.     Pulses: Normal pulses.     Heart sounds: Normal heart sounds.  Pulmonary:     Effort: Pulmonary effort is normal.     Breath sounds: Normal breath sounds.  Neurological:     General: No focal deficit present.     Mental Status: He is alert and oriented to person, place, and time.  Psychiatric:        Mood and Affect: Mood normal.        Behavior: Behavior normal.      LABORATORY DATA:  I have reviewed the labs as listed.  CBC Latest Ref Rng & Units 11/15/2020 08/12/2020 05/18/2020  WBC 4.0 - 10.5 K/uL 3.5(L) 4.1 4.9  Hemoglobin 13.0 - 17.0 g/dL 14.3 14.8 13.7  Hematocrit 39.0 - 52.0 % 42.7 44.3 41.3  Platelets 150 - 400 K/uL 198 222 216   CMP Latest Ref Rng & Units 11/15/2020 11/03/2020 08/12/2020  Glucose 70 - 99 mg/dL 152(H) - 232(H)  BUN 8 - 23 mg/dL 16 - 20  Creatinine 0.61 - 1.24 mg/dL 0.62 0.80 0.69  Sodium 135 - 145 mmol/L 135 - 134(L)  Potassium 3.5 - 5.1 mmol/L 3.9 - 5.1  Chloride 98 - 111 mmol/L 103 - 102  CO2 22 - 32 mmol/L 25 - 27  Calcium 8.9 - 10.3 mg/dL 8.7(L) - 9.0  Total Protein 6.5 - 8.1 g/dL 7.1 - 7.1  Total Bilirubin 0.3 - 1.2 mg/dL 1.1 - 1.1  Alkaline Phos 38 - 126 U/L 64 - 64  AST 15 - 41 U/L 21 - 23  ALT 0 - 44 U/L 21 - 25   Lab Results  Component Value Date   [id number] 10 11/15/2020   [id number] 13 08/12/2020   [id number] 16 03/24/2020    DIAGNOSTIC IMAGING:  I have independently reviewed the scans and discussed with the patient. CT ANGIO HEAD W OR WO CONTRAST  Result Date: 11/04/2020 CLINICAL  DATA:  Left hand and forearm swelling with numbness. Weakness and intermittent aphasia EXAM: CT ANGIOGRAPHY HEAD AND NECK TECHNIQUE: Multidetector CT imaging of the head and neck was performed using the standard protocol during bolus administration of intravenous contrast. Multiplanar CT image reconstructions and MIPs were obtained to evaluate the vascular anatomy. Carotid stenosis measurements (when applicable) are obtained utilizing NASCET criteria, using the distal internal carotid diameter as the denominator. CONTRAST:  29m OMNIPAQUE IOHEXOL 350 MG/ML SOLN COMPARISON:  Brain MRI 04/23/2020.  CTA of the neck 04/03/2019 FINDINGS: CT HEAD FINDINGS Brain: Chronic ischemic injury underestimated compared to MRI and most notable by CT in the left frontal parietal region. No evidence of acute infarct, hemorrhage, hydrocephalus, or mass Vascular: See below Skull: Normal. Negative for fracture or focal lesion. Sinuses: Imaged portions are clear. Orbits: Negative Review of the MIP images confirms the above findings CTA NECK FINDINGS Aortic arch: Atheromatous plaque.  Three vessel branching. Right carotid system: Scattered atheromatous plaque greatest at the bifurcation without flow limiting stenosis or ulceration. Left carotid system: Atheromatous wall thickening which is low-density and circumferential at the common carotid. The left common carotid is smaller than the right due to chronic left ICA occlusion. Vertebral arteries: Atheromatous plaque involving the proximal subclavian arteries. No flow limiting stenosis the subclavians. Stent at the left vertebral origin which appears sufficiently patent. No vertebral stenosis or beading. Skeleton: No acute finding. Cervical spine degeneration with endplate ridging encroaching on the lower canal. Other neck: No acute finding Upper chest: Centrilobular emphysema. Review of the MIP images confirms the above findings CTA HEAD FINDINGS Anterior circulation: The left ICA is  occluded to the level of the supraclinoid segment where there is reconstitution. Left A1 segment is hypoplastic. Left P1 segment hypoplasia with fetal type left PCA flow. Atheromatous plaque diffusely along the right cavernous ICA. The degree of plaque blooming and streak artifact limits visualization of the lumen. At the anterior genu of the right ICA calcified and low-density plaque causes stenosis of approximately 50%, progressed from 2020. Posterior circulation: The vertebral and basilar arteries are diffusely patent. The basilar is small in the setting of fetal type bilateral PCA flow. No branch occlusion, beading, or flow limiting stenosis. Negative for aneurysm Venous sinuses: Diffusely patent Anatomic variants: As above Review of the MIP images confirms the above findings IMPRESSION: 1. 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 2. Approximately 50% right cavernous ICA stenosis which is likely progressed from 2020. 3. Stable patency of left vertebral origin stent. 4. Aortic Atherosclerosis (ICD10-I70.0) and Emphysema (ICD10-J43.9). Electronically Signed   By: Monte Fantasia M.D.   On: 11/04/2020 04:50   CT ANGIO NECK W OR WO CONTRAST  Result Date: 11/04/2020 CLINICAL DATA:  Left hand and forearm swelling with numbness. Weakness and intermittent aphasia EXAM: CT ANGIOGRAPHY HEAD AND NECK TECHNIQUE: Multidetector CT imaging of the head and neck was performed using the standard protocol during bolus administration of intravenous contrast. Multiplanar CT image reconstructions and MIPs were obtained to evaluate the vascular anatomy. Carotid stenosis measurements (when applicable) are obtained utilizing NASCET criteria, using the distal internal carotid diameter as the denominator. CONTRAST:  67mL OMNIPAQUE IOHEXOL 350 MG/ML SOLN COMPARISON:  Brain MRI 04/23/2020.  CTA of the neck 04/03/2019 FINDINGS: CT HEAD FINDINGS Brain: Chronic ischemic injury underestimated compared to MRI and most  notable by CT in the left frontal parietal region. No evidence of acute infarct, hemorrhage, hydrocephalus, or mass Vascular: See below Skull: Normal. Negative for fracture or focal lesion. Sinuses: Imaged portions are clear. Orbits: Negative Review of the MIP images confirms the above findings CTA NECK FINDINGS Aortic arch: Atheromatous plaque.  Three vessel branching. Right carotid system: Scattered atheromatous plaque greatest at the bifurcation without flow limiting stenosis or ulceration. Left carotid system: Atheromatous wall thickening which is low-density and circumferential at the common carotid. The left common carotid is smaller than the right due to chronic left ICA occlusion. Vertebral arteries: Atheromatous plaque involving the proximal subclavian arteries. No flow limiting stenosis the subclavians. Stent at the left vertebral origin which appears sufficiently patent. No vertebral stenosis or beading. Skeleton: No acute finding. Cervical spine degeneration with endplate ridging encroaching on the lower canal. Other neck: No acute finding Upper chest: Centrilobular emphysema. Review of the MIP images confirms the above findings CTA HEAD FINDINGS Anterior circulation: The left ICA is occluded to the level of the supraclinoid segment where there is reconstitution. Left A1  segment is hypoplastic. Left P1 segment hypoplasia with fetal type left PCA flow. Atheromatous plaque diffusely along the right cavernous ICA. The degree of plaque blooming and streak artifact limits visualization of the lumen. At the anterior genu of the right ICA calcified and low-density plaque causes stenosis of approximately 50%, progressed from 2020. Posterior circulation: The vertebral and basilar arteries are diffusely patent. The basilar is small in the setting of fetal type bilateral PCA flow. No branch occlusion, beading, or flow limiting stenosis. Negative for aneurysm Venous sinuses: Diffusely patent Anatomic variants: As  above Review of the MIP images confirms the above findings IMPRESSION: 1. 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 2. Approximately 50% right cavernous ICA stenosis which is likely progressed from 2020. 3. Stable patency of left vertebral origin stent. 4. Aortic Atherosclerosis (ICD10-I70.0) and Emphysema (ICD10-J43.9). Electronically Signed   By: Monte Fantasia M.D.   On: 11/04/2020 04:50   MR Cervical Spine w/o contrast  Result Date: 11/20/2020 CLINICAL DATA:  Neck pain. Spinal stenosis of cervical region. Cervical radiculopathy, no red flags; left C7 radiculopathy; stenosis C4-5 to C6-C7. EXAM: MRI CERVICAL SPINE WITHOUT CONTRAST TECHNIQUE: Multiplanar, multisequence MR imaging of the cervical spine was performed. No intravenous contrast was administered. COMPARISON:  Cervical spine MRI 01/20/2018. FINDINGS: Alignment: Straightening of the expected cervical lordosis. No significant spondylolisthesis. Vertebrae: Vertebral body height is maintained. Mild scattered degenerative edema within the posterior elements. Trace degenerative endplate edema at T7-D2. Cord: Redemonstrate T2 hyperintense signal abnormality within the left aspect of the spinal cord neck spanning the C5-C6 level, with an appearance most suggestive of myelomalacia. Posterior Fossa, vertebral arteries, paraspinal tissues: No abnormality identified within included portions of the posterior fossa. Flow voids preserved within the imaged cervical vertebral arteries. Paraspinal soft tissues within normal limits. Disc levels: Unless otherwise stated, the level by level findings below have not significantly changed since prior MRI 01/20/2018. Multilevel disc degeneration. Most notably, there is moderate disc degeneration at C4-C5, C5-C6 and C6-C7. C2-C3: Mild uncovertebral hypertrophy. No significant disc herniation no spinal canal stenosis or neural foraminal narrowing. C3-C4: No significant disc herniation or spinal canal  stenosis. Mild uncovertebral hypertrophy (greater on the left). Resultant mild left neural foraminal narrowing. C4-C5: Posterior disc osteophyte complex. Uncovertebral and ligamentum flavum hypertrophy. Moderate/severe spinal canal stenosis with mild spinal cord flattening. Severe bilateral neural foraminal narrowing. C5-C6: Posterior disc osteophyte complex eccentric to the left. Uncovertebral and ligamentum flavum hypertrophy. Severe spinal canal stenosis (greater on the left), slightly progressed. Moderate flattening of the spinal cord (greater on the left) has also progressed. Severe bilateral neural foraminal narrowing. C6-C7: Posterior disc osteophyte complex. Uncovertebral and ligamentum flavum hypertrophy. Moderate spinal canal stenosis has slightly progressed, now with mild spinal cord flattening. Bilateral neural foraminal narrowing (moderate to moderately severe right, moderate left). C7-T1: Facet hypertrophy with resultant mild right neural foraminal narrowing. No significant disc herniation, spinal canal stenosis or left neural foraminal narrowing. IMPRESSION: Comparison is made to the prior cervical spine MRI of 01/20/2018. Cervical spondylosis as outlined and with findings most notably as follows. At C5-C6, a posterior disc osteophyte contributes to progressive multifactorial severe spinal canal stenosis (greater on the left). Moderate flattening of the spinal cord (greater on the left), also progressed. Unchanged T2 hyperintense signal abnormality within the left aspect of the spinal cord at this level likely reflecting myelomalacia. Unchanged severe bilateral neural foraminal narrowing. At C4-C5, a posterior disc osteophyte contributes to unchanged multifactorial moderate/severe spinal canal stenosis with mild flattening of the spinal cord.  Unchanged severe bilateral neural foraminal narrowing. At C6-C7, a posterior disc osteophyte contributes to slightly progressed moderate spinal canal stenosis,  now with mild spinal cord flattening. Unchanged bilateral neural foraminal narrowing (moderate to moderately severe right, moderate left). No significant spinal canal stenosis at the remaining levels. Additional sites of mild neural foraminal narrowing as above. Electronically Signed   By: Kellie Simmering DO   On: 11/20/2020 18:09   CT CHEST ABDOMEN PELVIS W CONTRAST  Result Date: 11/15/2020 CLINICAL DATA:  Surveillance of pancreatic cancer prior distal pancreatectomy and splenectomy. EXAM: CT CHEST, ABDOMEN, AND PELVIS WITH CONTRAST TECHNIQUE: Multidetector CT imaging of the chest, abdomen and pelvis was performed following the standard protocol during bolus administration of intravenous contrast. CONTRAST:  176mL OMNIPAQUE IOHEXOL 300 MG/ML  SOLN COMPARISON:  Multiple exams, including 03/24/2020 FINDINGS: CT CHEST FINDINGS Cardiovascular: Left Port-A-Cath tip: SVC. Left proximal vertebral artery stent versus less likely atherosclerotic calcification. Coronary, aortic arch, and branch vessel atherosclerotic vascular disease. Prior CABG. Mediastinum/Nodes: Scattered small mediastinal lymph nodes are not pathologically enlarged by size criteria. A right infrahilar node measures 0.7 cm in short axis on image 86 series 8. Lungs/Pleura: Biapical pleuroparenchymal scarring. Centrilobular emphysema. Musculoskeletal: Gynecomastia. CT ABDOMEN PELVIS FINDINGS Hepatobiliary: Cholelithiasis.  Otherwise unremarkable. Pancreas: Partial pancreatectomy. Hypodense/cystic lesion along the uncinate process of the pancreas measures 1.4 by 0.9 by 1.2 cm, previously the same by my measurements. Stable faint hypodensity along the postoperative resection margin of the pancreas adjacent to the clips. No findings of recurrence. Spleen: Splenectomy Adrenals/Urinary Tract: Left eccentric bladder diverticulum. The kidneys and adrenal glands appear unremarkable. Stomach/Bowel: Prominent stool throughout the colon favors constipation. Scattered  diverticula at the junction of the descending and sigmoid colon. Vascular/Lymphatic: Aortoiliac atherosclerotic vascular disease. Collateral vessels around the stomach. Portacaval node 1.3 cm in short axis on image 159 of series 8, previously 1.4 cm, within normal limits for this location. Upper normal sized retroperitoneal lymph nodes are present including a 0.8 cm left periaortic node on image 176 of series 8 (formerly 1.0 cm). Reproductive: Moderate prostatomegaly. Other: No supplemental non-categorized findings. Musculoskeletal: Midline and left paracentral supraumbilical hernias contain omental adipose tissue. Fatty left spermatic cord. Lower lumbar spondylosis and degenerative disc disease. IMPRESSION: 1. No findings of recurrent pancreatic adenocarcinoma. 2. Stable small cystic lesion along the uncinate process of the pancreas, 1.4 by 0.9 by 1.2 cm, roughly stable back through 2019. This could be a postinflammatory cystic lesion or intraductal papillary mucinous neoplasm. Follow up pancreatic protocol MRI in 1 years time is recommended. This recommendation follows ACR consensus guidelines: Management of Incidental Pancreatic Cysts: A White Paper of the ACR Incidental Findings Committee. J Am Coll Radiol 1856;31:497-026. 3. Other imaging findings of potential clinical significance: Coronary atherosclerosis. Prior CABG. Prominent stool throughout the colon favors constipation. Cholelithiasis. Moderate prostatomegaly. Midline and left paracentral supraumbilical hernias contain omental adipose tissue. Fatty left spermatic cord. Lower lumbar spondylosis and degenerative disc disease. 4. Emphysema and aortic atherosclerosis. Aortic Atherosclerosis (ICD10-I70.0) and Emphysema (ICD10-J43.9). Electronically Signed   By: Van Clines M.D.   On: 11/15/2020 11:29     ASSESSMENT:  1. Stage IIb (T2N1) pancreatic adenocarcinoma: -Status post distal pancreatectomy and splenectomy at [facility] by Dr. Zenia Resides on  11/20/2018. 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 -12 cycles of FOLFIRINOX from 12/30/2018 through 06/04/2019. -MRI of the brain on 05/19/2019 did not show any abnormalities. This was done because of new onset numbness in the left lower lip during therapy. -CT CAP on 03/24/2020 shows unchanged low-attenuation  lesion of the pancreatic uncinate measuring 1.2 x 0.8 cm.  Unchanged low-attenuation lesion near the surgical margin of the pancreatic head and neck junction measuring 1.2 x 0.9 cm.  These may reflect postoperative seroma.  Unchanged prominent retroperitoneal lymph nodes.  2. Post splenectomy state: -He received vaccination prior to splenectomy.  PLAN:  1. Stage IIb (T2N1) pancreatic adenocarcinoma: -He does not report any clinical signs or symptoms of recurrence. -Reviewed CT CAP from 11/15/2020 which showed stable small cystic lesion along the uncinate process of the pancreas stable since 2019.  This could be postinflammatory cystic lesion.  Incisional hernias present. -Reviewed labs from 11/15/2020 which showed normal CA 19-9 and LFTs. -RTC 6 months with repeat labs and scan. -Continue port flush every 3 months.  2.  Left upper extremity DVT: -Continue Eliquis at this time. -We will plan to repeat scan in 6 months.  If there is no evidence of recurrence, will discontinue Port-A-Cath and will stop Eliquis at that time.  3.  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 -He is seeing Dr. Lorin Mercy who thinks likely etiology is cervical stenosis and bilateral carpal tunnel syndrome.    Orders placed this encounter:  No orders of the defined types were placed in this encounter.    Derek Jack, MD [place] [phone]   I, Milinda Antis, am acting as a scribe for Dr. Sanda Linger.  I, Derek Jack MD, have reviewed the above documentation for accuracy and completeness, and I agree with  the above.

## 2020-11-23 DIAGNOSIS — E039 Hypothyroidism, unspecified: Secondary | ICD-10-CM | POA: Diagnosis not present

## 2020-11-23 DIAGNOSIS — G40009 Localization-related (focal) (partial) idiopathic epilepsy and epileptic syndromes with seizures of localized onset, not intractable, without status epilepticus: Secondary | ICD-10-CM | POA: Diagnosis not present

## 2020-11-23 DIAGNOSIS — Z0189 Encounter for other specified special examinations: Secondary | ICD-10-CM | POA: Diagnosis not present

## 2020-11-23 DIAGNOSIS — E1140 Type 2 diabetes mellitus with diabetic neuropathy, unspecified: Secondary | ICD-10-CM | POA: Diagnosis not present

## 2020-11-23 DIAGNOSIS — E782 Mixed hyperlipidemia: Secondary | ICD-10-CM | POA: Diagnosis not present

## 2020-11-23 DIAGNOSIS — I10 Essential (primary) hypertension: Secondary | ICD-10-CM | POA: Diagnosis not present

## 2020-11-23 DIAGNOSIS — N400 Enlarged prostate without lower urinary tract symptoms: Secondary | ICD-10-CM | POA: Diagnosis not present

## 2020-11-23 DIAGNOSIS — K219 Gastro-esophageal reflux disease without esophagitis: Secondary | ICD-10-CM | POA: Diagnosis not present

## 2020-11-23 DIAGNOSIS — Z7689 Persons encountering health services in other specified circumstances: Secondary | ICD-10-CM | POA: Diagnosis not present

## 2020-11-23 DIAGNOSIS — Z712 Person consulting for explanation of examination or test findings: Secondary | ICD-10-CM | POA: Diagnosis not present

## 2020-11-23 DIAGNOSIS — I252 Old myocardial infarction: Secondary | ICD-10-CM | POA: Diagnosis not present

## 2020-11-23 DIAGNOSIS — R5383 Other fatigue: Secondary | ICD-10-CM | POA: Diagnosis not present

## 2020-11-24 ENCOUNTER — Telehealth: Payer: Self-pay

## 2020-11-24 MED ORDER — GLUCOSE BLOOD VI STRP: 1.0000 | ORAL_STRIP | Freq: Four times a day (QID) | 2 refills | Status: DC

## 2020-11-24 NOTE — Telephone Encounter (Signed)
Rx sent 

## 2020-11-24 NOTE — Telephone Encounter (Signed)
Patient stopped by and said he needs strips for his libre 2 meter. [place]

## 2020-11-30 ENCOUNTER — Other Ambulatory Visit: Payer: HMO

## 2020-12-01 ENCOUNTER — Encounter: Payer: Self-pay | Admitting: "Endocrinology

## 2020-12-01 ENCOUNTER — Other Ambulatory Visit: Payer: Self-pay

## 2020-12-01 ENCOUNTER — Ambulatory Visit: Payer: HMO | Admitting: "Endocrinology

## 2020-12-01 VITALS — BP 110/58 | HR 76 | Ht 70.0 in | Wt 193.2 lb

## 2020-12-01 DIAGNOSIS — E782 Mixed hyperlipidemia: Secondary | ICD-10-CM

## 2020-12-01 DIAGNOSIS — E1165 Type 2 diabetes mellitus with hyperglycemia: Secondary | ICD-10-CM | POA: Diagnosis not present

## 2020-12-01 DIAGNOSIS — E039 Hypothyroidism, unspecified: Secondary | ICD-10-CM

## 2020-12-01 DIAGNOSIS — I10 Essential (primary) hypertension: Secondary | ICD-10-CM | POA: Diagnosis not present

## 2020-12-01 LAB — POCT GLYCOSYLATED HEMOGLOBIN (HGB A1C): HbA1c, POC (controlled diabetic range): 9.6 % — AB (ref 0.0–7.0)

## 2020-12-01 MED ORDER — LANTUS SOLOSTAR 100 UNIT/ML ~~LOC~~ SOPN: 46.0000 [IU] | PEN_INJECTOR | Freq: Every day | SUBCUTANEOUS | 1 refills | Status: DC

## 2020-12-01 MED ORDER — NOVOLOG FLEXPEN 100 UNIT/ML ~~LOC~~ SOPN: 10.0000 [IU] | PEN_INJECTOR | Freq: Three times a day (TID) | SUBCUTANEOUS | 1 refills | Status: DC

## 2020-12-01 MED ORDER — FREESTYLE LIBRE 2 SENSOR MISC
1.0000 | 3 refills | Status: DC
Start: 2020-12-01 — End: 2021-02-21

## 2020-12-01 NOTE — Progress Notes (Signed)
12/01/2020, 1:34 PM       Endocrinology follow-up note   Subjective:    Patient ID: Mark Foster, male    DOB: [birth year].  Mark Foster is being seen in follow-up for  management of currently uncontrolled symptomatic pancreatic diabetes, hypothyroidism, hyperlipidemia. PMD:  Mark Squibb, MD.   Past Medical History:  Diagnosis Date  . Bilateral carpal tunnel syndrome 10/19/2020  . CAD (coronary artery disease)    a. 01/2020: CABG x4 with LIMA-LAD, SVG-OM, SVG-PDA and SVG-D1  . Carotid artery obstruction, left    Left ICA occlusion  . Cervical compression fracture ([place])   . Cervical spinal stenosis   . Diabetic neuropathy (HCC)    Bilateral legs  . Diverticulitis   . Family history of pancreatic cancer   . History of kidney stones   . Hypothyroidism   . Pancreatic cancer ([place])   . Stenosis of left vertebral artery   . 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 Type 2 diabetes mellitus ([place])    Past Surgical History:  Procedure Laterality Date  . APPENDECTOMY    . BUBBLE STUDY N/A 11/18/2019   Procedure: BUBBLE STUDY;  Surgeon: Mark Commons, MD;  Location: [facility];  Service: Cardiology;  Laterality: N/A;  . CARDIAC SURGERY    . COLON RESECTION     For diverticulitis  . COLON SURGERY    . CORONARY ARTERY BYPASS GRAFT N/A 01/26/2020   Procedure: CORONARY ARTERY BYPASS GRAFTING (CABG) TIMES FOUR USING LEFT INTERNAL MAMMARY VEIN AND RIGHT GREATER SAPHENOUS VEIN;  Surgeon: Melrose Nakayama, MD;  Location: [facility];  Service: Open Heart Surgery;  Laterality: N/A;  . ENDOVEIN HARVEST OF GREATER SAPHENOUS VEIN Right 01/26/2020   Procedure: [place] Of Greater Saphenous Vein;  Surgeon: Melrose Nakayama, MD;  Location: [facility];  Service: Open Heart Surgery;  Laterality: Right;  . ESOPHAGOGASTRODUODENOSCOPY N/A 10/03/2018   Procedure: ESOPHAGOGASTRODUODENOSCOPY (EGD);  Surgeon: Mark Banister, MD;  Location: [name];  Service: Endoscopy;  Laterality: N/A;  . EUS N/A 10/03/2018   Procedure: UPPER ENDOSCOPIC ULTRASOUND (EUS) RADIAL;  Surgeon: Mark Banister, MD;  Location: [facility];  Service: Endoscopy;  Laterality: N/A;  . EYE SURGERY Bilateral   . FINE NEEDLE ASPIRATION N/A 10/03/2018   Procedure: FINE NEEDLE ASPIRATION (FNA) LINEAR;  Surgeon: Mark Banister, MD;  [facility]: [facility];  Service: Endoscopy;  Laterality: N/A;  . IR ANGIO INTRA EXTRACRAN SEL COM CAROTID INNOMINATE BILAT MOD SED  01/21/2018  . IR ANGIO INTRA EXTRACRAN SEL COM CAROTID INNOMINATE UNI R MOD SED  03/21/2018  . IR ANGIO VERTEBRAL SEL VERTEBRAL BILAT MOD SED  01/21/2018  . IR TRANSCATH EXCRAN VERT OR CAR A STENT  03/21/2018  . LEFT HEART CATH AND CORONARY ANGIOGRAPHY N/A 01/08/2020   Procedure: LEFT HEART CATH AND CORONARY ANGIOGRAPHY;  Surgeon: Mark Man, MD;  Location: [facility];  Service: Cardiovascular;  Laterality: N/A;  . PORTACATH PLACEMENT Left 12/23/2018   Procedure: INSERTION PORT-A-CATH;  Surgeon: Mark Cagey, MD;  Location: [facility];  Service: General;  Laterality: Left;  . RADIOLOGY  WITH ANESTHESIA N/A 03/21/2018   Procedure: IR WITH ANESTHESIA WITH STENT PLACEMENT;  Surgeon: Mark Bras, MD;  Location: [facility];  Service: Radiology;  Laterality: N/A;  . ROTATOR CUFF REPAIR     Left  . SPLENECTOMY    . TEE WITHOUT CARDIOVERSION N/A 11/18/2019   Procedure: TRANSESOPHAGEAL ECHOCARDIOGRAM (TEE) WITH PROPOFOL;  Surgeon: Mark Commons, MD;  Location[facility];  Service: Cardiology;  Laterality: N/A;  . TEE WITHOUT CARDIOVERSION N/A 01/26/2020   Procedure: TRANSESOPHAGEAL ECHOCARDIOGRAM (TEE);  Surgeon: Melrose Nakayama, MD;  Location: [facility];  Service: Open Heart Surgery;  Laterality: N/A;  . TONSILLECTOMY     Social History   Socioeconomic History  . Marital status: Married    Spouse name: Not on file  . Number of children: 2  . Years of  education: Not on file  . Highest education level: Not on file  Occupational History  . Occupation: Estate agent  Tobacco Use  . Smoking status: Former Smoker    Types: Cigarettes    Start date: 10/06/2019  . Smokeless tobacco: Never Used  Vaping Use  . Vaping Use: Never used  Substance and Sexual Activity  . Alcohol use: Yes    Alcohol/week: 1.0 standard drink    Types: 1 Cans of beer per week    Comment: rare   . Drug use: Never  . Sexual activity: Not on file  Other Topics Concern  . Not on file  Social History Narrative  . Not on file   Social Determinants of Health   Financial Resource Strain: Not on file  Food Insecurity: Not on file  Transportation Needs: Not on file  Physical Activity: Not on file  Stress: Not on file  Social Connections: Not on file   Outpatient Encounter Medications as of 12/01/2020  Medication Sig  . apixaban (ELIQUIS) 5 MG TABS tablet Take 1 tablet (5 mg total) by mouth 2 (two) times daily.  Marland Kitchen aspirin EC 81 MG EC tablet Take 1 tablet (81 mg total) by mouth daily.  Marland Kitchen atorvastatin (LIPITOR) 80 MG tablet Take 1 tablet (80 mg total) by mouth daily at 6 PM.  . Continuous Blood Gluc Receiver (FREESTYLE LIBRE 2 READER) DEVI As directed  . Continuous Blood Gluc Sensor (FREESTYLE LIBRE 2 SENSOR) MISC 1 Piece by Does not apply route every 14 (fourteen) days.  Marland Kitchen gabapentin (NEURONTIN) 300 MG capsule Take 1 capsule (300 mg total) by mouth 3 (three) times daily.  Marland Kitchen glucose blood test strip 1 each by Other route 4 (four) times daily. Use as instructed  . insulin aspart (NOVOLOG FLEXPEN) 100 UNIT/ML FlexPen Inject 10-16 Units into the skin 3 (three) times daily before meals.  . insulin glargine (LANTUS SOLOSTAR) 100 UNIT/ML Solostar Pen Inject 46 Units into the skin at bedtime.  . Insulin Pen Needle 32G X 4 MM MISC 1 Device by Does not apply route daily. Use as directed to inject insulin four times daily  . levothyroxine (SYNTHROID) 50 MCG tablet TAKE 1  TABLET EVERY DAY  . lidocaine-prilocaine (EMLA) cream Apply 1 application topically daily as needed (prior to port being accessed (~every 3 months)).  . metoprolol tartrate (LOPRESSOR) 25 MG tablet Take 1 tablet (25 mg total) by mouth 2 (two) times daily.  . nitroGLYCERIN (NITROSTAT) 0.4 MG SL tablet Place 1 tablet (0.4 mg total) under the tongue every 5 (five) minutes as needed for chest pain.  . Omega-3 1000 MG CAPS Take 1,000 mg by mouth daily.  Marland Kitchen  ondansetron (ZOFRAN) 4 MG tablet Take by mouth.  . sertraline (ZOLOFT) 50 MG tablet TAKE 1 TABLET BY MOUTH DAILY (Patient taking differently: Take 50 mg by mouth daily.)  . tamsulosin (FLOMAX) 0.4 MG CAPS capsule Take 1 capsule (0.4 mg total) by mouth daily.  . traMADol (ULTRAM) 50 MG tablet Take 50 mg by mouth 4 (four) times daily as needed.  . vitamin B-12 (CYANOCOBALAMIN) 1000 MCG tablet Take 1,000 mcg by mouth daily.  . [DISCONTINUED] Continuous Blood Gluc Sensor (FREESTYLE LIBRE 2 SENSOR) MISC 1 Piece by Does not apply route every 14 (fourteen) days.  . [DISCONTINUED] insulin aspart (NOVOLOG FLEXPEN) 100 UNIT/ML FlexPen Inject 8-14 Units into the skin 3 (three) times daily before meals.  . [DISCONTINUED] insulin glargine (LANTUS SOLOSTAR) 100 UNIT/ML Solostar Pen Inject 40 Units into the skin at bedtime.  . [DISCONTINUED] Levetiracetam (KEPPRA XR) 750 MG TB24 Take 1 tablet (750 mg total) by mouth daily.   Facility-Administered Encounter Medications as of 12/01/2020  Medication  . 0.9 %  sodium chloride infusion  . 0.9 %  sodium chloride infusion  . dextrose 5 % solution  . heparin lock flush 100 unit/mL  . sodium chloride flush (NS) 0.9 % injection 10 mL  . sodium chloride flush (NS) 0.9 % injection 10 mL    ALLERGIES: Allergies  Allergen Reactions  . Demerol [Meperidine Hcl] Other (See Comments)    convulsions    VACCINATION STATUS: Immunization History  Administered Date(s) Administered  . Fluad Quad(high Dose 65+) 05/18/2020  .  HiB (PRP-T) 10/23/2018  . Influenza,inj,Quad PF,6+ Mos 06/19/2019  . Influenza-Unspecified 08/02/2018  . Meningococcal Mcv4o 10/23/2018  . Pneumococcal Conjugate-13 10/23/2018    Diabetes He presents for his follow-up diabetic visit. He has type 1 diabetes mellitus. Onset time: He was diagnosed at approximate age of 54 years. His disease course has been worsening (He did have A1c of 9.7% in May 2019.  However, more recently he has documented controlled glycemic profile averaging 134 over the last 2 weeks.). There are no hypoglycemic associated symptoms. Pertinent negatives for hypoglycemia include no confusion, headaches, pallor or seizures. Associated symptoms include polydipsia and polyuria. Pertinent negatives for diabetes include no chest pain, no fatigue, no polyphagia and no weakness. There are no hypoglycemic complications. Symptoms are worsening. Diabetic complications include a CVA. Pertinent negatives for diabetic complications include no heart disease. (Awaiting stent placement in his carotid artery.) Risk factors for coronary artery disease include diabetes mellitus, dyslipidemia, hypertension, male sex and sedentary lifestyle. Current diabetic treatment includes insulin injections. His weight is increasing steadily. He is following a generally unhealthy diet. When asked about meal planning, he reported none. He has not had a previous visit with a dietitian. He rarely participates in exercise. His home blood glucose trend is increasing steadily. His breakfast blood glucose range is generally >200 mg/dl. His lunch blood glucose range is generally >200 mg/dl. His dinner blood glucose range is generally >200 mg/dl. His bedtime blood glucose range is generally >200 mg/dl. His overall blood glucose range is >200 mg/dl. (He presents with above target glycemic profile.  He recently received CGM device-freestyle libre.  Analysis shows 19% time range, 59% above range, no major hypoglycemia.  His average  blood glucose is 234.  His point-of-care A1c is 9.6% increasing from 8.9% during his last visit.     ) An ACE inhibitor/angiotensin II receptor blocker is not being taken.  Hyperlipidemia This is a chronic problem. The current episode started more than 1 year ago.  The problem is uncontrolled. Exacerbating diseases include diabetes. Pertinent negatives include no chest pain, myalgias or shortness of breath. Current antihyperlipidemic treatment includes statins. Risk factors for coronary artery disease include diabetes mellitus, dyslipidemia, hypertension, male sex and a sedentary lifestyle.   Review of systems: Limited as above   Objective:    BP (!) 110/58   Pulse 76   Ht 5\' 10"  (1.778 m)   Wt 193 lb 3.2 oz (87.6 kg)   BMI 27.72 kg/m   Wt Readings from Last 3 Encounters:  12/01/20 193 lb 3.2 oz (87.6 kg)  11/22/20 195 lb 8 oz (88.7 kg)  11/12/20 184 lb (83.5 kg)      CMP ( most recent) CMP     Component Value Date/Time   NA 135 11/15/2020 0844   K 3.9 11/15/2020 0844   CL 103 11/15/2020 0844   CO2 25 11/15/2020 0844   GLUCOSE 152 (H) 11/15/2020 0844   BUN 16 11/15/2020 0844   CREATININE 0.62 11/15/2020 0844   CREATININE 0.86 05/17/2020 1329   CALCIUM 8.7 (L) 11/15/2020 0844   PROT 7.1 11/15/2020 0844   ALBUMIN 3.9 11/15/2020 0844   AST 21 11/15/2020 0844   AST 16 12/11/2018 1018   ALT 21 11/15/2020 0844   ALT 19 12/11/2018 1018   ALKPHOS 64 11/15/2020 0844   BILITOT 1.1 11/15/2020 0844   BILITOT 0.8 12/11/2018 1018   GFRNONAA >60 11/15/2020 0844   GFRNONAA 91 05/17/2020 1329   GFRAA >60 05/18/2020 1041   GFRAA 105 05/17/2020 1329     Diabetic Labs (most recent): Lab Results  Component Value Date   HGBA1C 9.6 (A) 12/01/2020   HGBA1C 8.9 (H) 08/12/2020   HGBA1C 11.3 (H) 05/17/2020     Lipid Panel ( most recent) Lipid Panel     Component Value Date/Time   CHOL 111 05/17/2020 1329   TRIG 90 05/17/2020 1329   HDL 50 05/17/2020 1329   CHOLHDL 2.2  05/17/2020 1329   VLDL 15 01/06/2020 1008   LDLCALC 44 05/17/2020 1329    Assessment & Plan:   1. Uncontrolled type 2 diabetes mellitus with hyperglycemia ([place])  - Mark Foster has history of uncontrolled diabetes .  He presents with above target glycemic profile.  He recently received CGM device-freestyle libre.  Analysis shows 19% time range, 59% above range, no major hypoglycemia.  His average blood glucose is 234.  His point-of-care A1c is 9.6% increasing from 8.9% during his last visit.     He reports that his fasting blood glucose readings are above 200 mg per DL.    -  Recent labs reviewed.  -his diabetes is likely pancreatic diabetes given his history of heavy alcohol use in the past.  Administratively  reclassified his diabetes as  type 1 for management purposes. -He is advised to introduce more carbohydrates to his diet.  He has benefited from the weight gain so far.   -He is not a suitable candidate for oral antidiabetic medications nor incretin therapy. 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 -He is advised to increase Lantus to 46 units nightly, increase NovoLog to 10 units 3 times daily AC for Premeal blood glucose readings between 90-150 mg per DL, and specific correction dose for readings above 150 mg per DL.  He agrees to continue to monitor blood glucose at least 4  times a day using his CGM device.   -Patient is encouraged to call clinic for blood glucose levels less than 70 or above 300 mg /dl.  - Patient specific target  A1c;  LDL, HDL, Triglycerides, were discussed in detail. -He is anti-GAD antibodies, anti-islet cell antibodies are negative indicating his diabetes is not likely to be type 1,  likely related to his prior alcohol abuse causing pancreatic diabetes.   -The priority in this patient would  be to avoid hypoglycemia due to the fact that he lacks  alpha cell  glucagon response on the background of pancreatectomy  and prior alcohol induced diabetes.  2) Lipids/HPL: His recent lipid panel showed controlled LDL at 44, improving from 123.  He is advised to continue Lipitor 40 mg p.o. nightly.      3) hypertension/blood pressure: His blood pressure is controlled to target.  He is on metoprolol 100 mg p.o. daily.  4)  Weight/Diet: He continues to regain his weight back-gained adequate weight.  He is not a candidate for weight loss.  He is encouraged to continue to introduce complex carbohydrates.  Diet, exercise, and detailed carbohydrates information provided.  5) hypothyroidism -He is currently on levothyroxine 50 mcg p.o. daily.  He does not have recent thyroid function tests, thyroid function test from December were consistent with appropriate replacement.     - We discussed about the correct intake of his thyroid hormone, on empty stomach at fasting, with water, separated by at least 30 minutes from breakfast and other medications,  and separated by more than 4 hours from calcium, iron, multivitamins, acid reflux medications (PPIs). -Patient is made aware of the fact that thyroid hormone replacement is needed for life, dose to be adjusted by periodic monitoring of thyroid function tests.   6) Chronic Care/Health Maintenance:  -he  is on Statin medications and  is encouraged to continue to follow up with Ophthalmology, Dentist,  Podiatrist at least yearly or according to recommendations, and advised to  stay away from smoking. I have recommended yearly flu vaccine and pneumonia vaccination at least every 5 years;  and  sleep for at least 7 hours a day.  -His screening ABI was negative for PAD in December 2021.  This study will be repeated in December 2026, or sooner if needed. - I advised patient to maintain close follow up with Mark Squibb, MD for primary care needs.   - Time  spent on this patient care encounter:  45 min, of which > 50% was spent in  counseling and the rest reviewing his blood glucose logs , discussing his hypoglycemia and hyperglycemia episodes, reviewing his current and  previous labs / studies  ( including abstraction from other facilities) and medications  doses and developing a  long term treatment plan and documenting his care.   Please refer to Patient Instructions for Blood Glucose Monitoring and Insulin/Medications Dosing Guide"  in media tab for additional information. Please  also refer to " Patient Self Inventory" in the Media  tab for reviewed elements of pertinent patient history.  Mark Foster participated in the discussions, expressed understanding, and voiced agreement with the above plans.  All questions were answered to his satisfaction. he is encouraged to contact clinic should he have any questions or concerns prior to his return visit.    Follow up plan: - Return in about 4 months (around 04/14/2021) for Bring Meter and Logs- A1c in Office.  Glade Lloyd, MD [place]  [facility] [address] [place], [place] [address] Phone: [phone]  Fax: [phone]    12/01/2020, 1:34 PM  This note was partially dictated with voice recognition software. Similar sounding words can be transcribed inadequately or may not  be corrected upon review.

## 2021-01-07 ENCOUNTER — Telehealth: Payer: Self-pay

## 2021-01-07 DIAGNOSIS — E1165 Type 2 diabetes mellitus with hyperglycemia: Secondary | ICD-10-CM

## 2021-01-07 MED ORDER — LANTUS SOLOSTAR 100 UNIT/ML ~~LOC~~ SOPN: 46.0000 [IU] | PEN_INJECTOR | Freq: Every day | SUBCUTANEOUS | 1 refills | Status: DC

## 2021-01-07 MED ORDER — NOVOLOG FLEXPEN 100 UNIT/ML ~~LOC~~ SOPN: 10.0000 [IU] | PEN_INJECTOR | Freq: Three times a day (TID) | SUBCUTANEOUS | 2 refills | Status: DC

## 2021-01-07 MED ORDER — NOVOLOG FLEXPEN 100 UNIT/ML ~~LOC~~ SOPN: 10.0000 [IU] | PEN_INJECTOR | Freq: Three times a day (TID) | SUBCUTANEOUS | 0 refills | Status: DC

## 2021-01-07 NOTE — Telephone Encounter (Signed)
Rx's sent in. °

## 2021-01-07 NOTE — Telephone Encounter (Signed)
Pt states that [name] has been sending request for refills on his Novolog and his Lantus. He said he will be out tonight. Can you send this in?

## 2021-01-07 NOTE — Addendum Note (Signed)
Addended by: Ellin Saba on: 01/07/2021 11:25 AM   Modules accepted: Orders

## 2021-02-01 ENCOUNTER — Telehealth: Payer: Self-pay

## 2021-02-01 DIAGNOSIS — E1165 Type 2 diabetes mellitus with hyperglycemia: Secondary | ICD-10-CM

## 2021-02-01 MED ORDER — LANTUS SOLOSTAR 100 UNIT/ML ~~LOC~~ SOPN: 46.0000 [IU] | PEN_INJECTOR | Freq: Every day | SUBCUTANEOUS | 1 refills | Status: DC

## 2021-02-01 NOTE — Telephone Encounter (Signed)
Rx sent 

## 2021-02-01 NOTE — Telephone Encounter (Signed)
Patient requesting refill on insulin glargine (LANTUS SOLOSTAR) 100 UNIT/ML Solostar Pen. Please send to [facility]

## 2021-02-21 ENCOUNTER — Other Ambulatory Visit: Payer: Self-pay | Admitting: "Endocrinology

## 2021-02-21 DIAGNOSIS — E1165 Type 2 diabetes mellitus with hyperglycemia: Secondary | ICD-10-CM

## 2021-02-22 ENCOUNTER — Inpatient Hospital Stay (HOSPITAL_COMMUNITY): Payer: HMO | Attending: Hematology and Oncology

## 2021-02-22 DIAGNOSIS — Z452 Encounter for adjustment and management of vascular access device: Secondary | ICD-10-CM | POA: Diagnosis not present

## 2021-02-22 DIAGNOSIS — C259 Malignant neoplasm of pancreas, unspecified: Secondary | ICD-10-CM

## 2021-02-22 DIAGNOSIS — E119 Type 2 diabetes mellitus without complications: Secondary | ICD-10-CM | POA: Diagnosis not present

## 2021-02-22 DIAGNOSIS — C251 Malignant neoplasm of body of pancreas: Secondary | ICD-10-CM | POA: Insufficient documentation

## 2021-02-22 DIAGNOSIS — I10 Essential (primary) hypertension: Secondary | ICD-10-CM | POA: Diagnosis not present

## 2021-02-22 DIAGNOSIS — E782 Mixed hyperlipidemia: Secondary | ICD-10-CM | POA: Diagnosis not present

## 2021-02-22 LAB — COMPREHENSIVE METABOLIC PANEL
ALT: 23 U/L (ref 0–44)
AST: 22 U/L (ref 15–41)
Albumin: 3.9 g/dL (ref 3.5–5.0)
Alkaline Phosphatase: 69 U/L (ref 38–126)
Anion gap: 7 (ref 5–15)
BUN: 14 mg/dL (ref 8–23)
CO2: 25 mmol/L (ref 22–32)
Calcium: 8.5 mg/dL — ABNORMAL LOW (ref 8.9–10.3)
Chloride: 106 mmol/L (ref 98–111)
Creatinine, Ser: 0.73 mg/dL (ref 0.61–1.24)
GFR, Estimated: >60 mL/min (ref >60)
Glucose, Bld: 206 mg/dL — ABNORMAL HIGH (ref 70–99)
Potassium: 3.6 mmol/L (ref 3.5–5.1)
Sodium: 138 mmol/L (ref 135–145)
Total Bilirubin: 0.9 mg/dL (ref 0.3–1.2)
Total Protein: 6.8 g/dL (ref 6.5–8.1)

## 2021-02-22 LAB — CBC WITH DIFFERENTIAL/PLATELET
Abs Immature Granulocytes: 0 10*3/uL (ref 0.00–0.07)
Basophils Absolute: 0 10*3/uL (ref 0.0–0.1)
Basophils Relative: 0 %
Eosinophils Absolute: 0.1 10*3/uL (ref 0.0–0.5)
Eosinophils Relative: 2 %
HCT: 40 % (ref 39.0–52.0)
Hemoglobin: 13.9 g/dL (ref 13.0–17.0)
Immature Granulocytes: 0 %
Lymphocytes Relative: 60 %
Lymphs Abs: 2 10*3/uL (ref 0.7–4.0)
MCH: 34.3 pg — ABNORMAL HIGH (ref 26.0–34.0)
MCHC: 34.8 g/dL (ref 30.0–36.0)
MCV: 98.8 fL (ref 80.0–100.0)
Monocytes Absolute: 0.3 10*3/uL (ref 0.1–1.0)
Monocytes Relative: 7 %
Neutro Abs: 1 10*3/uL — ABNORMAL LOW (ref 1.7–7.7)
Neutrophils Relative %: 31 %
Platelets: 183 10*3/uL (ref 150–400)
RBC: 4.05 MIL/uL — ABNORMAL LOW (ref 4.22–5.81)
RDW: 15.9 % — ABNORMAL HIGH (ref 11.5–15.5)
WBC: 3.4 10*3/uL — ABNORMAL LOW (ref 4.0–10.5)
nRBC: 0 % (ref 0.0–0.2)

## 2021-02-22 MED ORDER — HEPARIN SOD (PORK) LOCK FLUSH 100 UNIT/ML IV SOLN
500.0000 [IU] | Freq: Once | INTRAVENOUS | Status: AC
  Administered 2021-02-22: 500 [IU] via INTRAVENOUS

## 2021-02-22 MED ORDER — SODIUM CHLORIDE 0.9% FLUSH: 10.0000 mL | Freq: Once | INTRAVENOUS | Status: DC

## 2021-02-22 NOTE — Patient Instructions (Signed)
[facility]  Discharge Instructions: Thank you for choosing [facility] to provide your oncology and hematology care.  If you have a lab appointment with the Cancer Center, please come in thru the Main Entrance and check in at the main information desk.  Wear comfortable clothing and clothing appropriate for easy access to any Portacath or PICC line.   We strive to give you quality time with your provider. You may need to reschedule your appointment if you arrive late (15 or more minutes).  Arriving late affects you and other patients whose appointments are after yours.  Also, if you miss three or more appointments without notifying the office, you may be dismissed from the clinic at the provider's discretion.      For prescription refill requests, have your pharmacy contact our office and allow 72 hours for refills to be completed.        To help prevent nausea and vomiting after your treatment, we encourage you to take your nausea medication as directed.  BELOW ARE SYMPTOMS THAT SHOULD BE REPORTED IMMEDIATELY: *FEVER GREATER THAN 100.4 F (38 C) OR HIGHER *CHILLS OR SWEATING *NAUSEA AND VOMITING THAT IS NOT CONTROLLED WITH YOUR NAUSEA MEDICATION *UNUSUAL SHORTNESS OF BREATH *UNUSUAL BRUISING OR BLEEDING *URINARY PROBLEMS (pain or burning when urinating, or frequent urination) *BOWEL PROBLEMS (unusual diarrhea, constipation, pain near the anus) TENDERNESS IN MOUTH AND THROAT WITH OR WITHOUT PRESENCE OF ULCERS (sore throat, sores in mouth, or a toothache) UNUSUAL RASH, SWELLING OR PAIN  UNUSUAL VAGINAL DISCHARGE OR ITCHING   Items with * indicate a potential emergency and should be followed up as soon as possible or go to the Emergency Department if any problems should occur.  Please show the CHEMOTHERAPY ALERT CARD or IMMUNOTHERAPY ALERT CARD at check-in to the Emergency Department and triage nurse.  Should you have questions after your visit or need to cancel  or reschedule your appointment, please contact [facility] [phone]  and follow the prompts.  Office hours are 8:00 a.m. to 4:30 p.m. Monday - Friday. Please note that voicemails left after 4:00 p.m. may not be returned until the following business day.  We are closed weekends and major holidays. You have access to a nurse at all times for urgent questions. Please call the main number to the clinic [phone] and follow the prompts.  For any non-urgent questions, you may also contact your provider using MyChart. We now offer e-Visits for anyone 18 and older to request care online for non-urgent symptoms. For details visit [email or web address].   Also download the MyChart app! Go to the app store, search "MyChart", open the app, select [facility], and log in with your MyChart username and password.  Due to Covid, a mask is required upon entering the hospital/clinic. If you do not have a mask, one will be given to you upon arrival. For doctor visits, patients may have 1 support person aged 18 or older with them. For treatment visits, patients cannot have anyone with them due to current Covid guidelines and our immunocompromised population.  

## 2021-02-22 NOTE — Progress Notes (Signed)
Patients port flushed without difficulty.  No blood return noted with no bruising or swelling noted at site.  Band aid applied.  Labs drawn peripherally.  Stable during access and blood draw.  VSS with discharge and left in satisfactory condition with no s/s of distress noted.

## 2021-02-23 LAB — CANCER ANTIGEN 19-9: CA 19-9: 10 U/mL (ref 0–35)

## 2021-02-28 DIAGNOSIS — K219 Gastro-esophageal reflux disease without esophagitis: Secondary | ICD-10-CM | POA: Diagnosis not present

## 2021-02-28 DIAGNOSIS — G40309 Generalized idiopathic epilepsy and epileptic syndromes, not intractable, without status epilepticus: Secondary | ICD-10-CM | POA: Diagnosis not present

## 2021-02-28 DIAGNOSIS — I10 Essential (primary) hypertension: Secondary | ICD-10-CM | POA: Diagnosis not present

## 2021-02-28 DIAGNOSIS — N400 Enlarged prostate without lower urinary tract symptoms: Secondary | ICD-10-CM | POA: Diagnosis not present

## 2021-02-28 DIAGNOSIS — E782 Mixed hyperlipidemia: Secondary | ICD-10-CM | POA: Diagnosis not present

## 2021-02-28 DIAGNOSIS — M545 Low back pain, unspecified: Secondary | ICD-10-CM | POA: Diagnosis not present

## 2021-02-28 DIAGNOSIS — E119 Type 2 diabetes mellitus without complications: Secondary | ICD-10-CM | POA: Diagnosis not present

## 2021-02-28 DIAGNOSIS — E039 Hypothyroidism, unspecified: Secondary | ICD-10-CM | POA: Diagnosis not present

## 2021-02-28 DIAGNOSIS — E785 Hyperlipidemia, unspecified: Secondary | ICD-10-CM | POA: Diagnosis not present

## 2021-03-28 DIAGNOSIS — E785 Hyperlipidemia, unspecified: Secondary | ICD-10-CM | POA: Diagnosis not present

## 2021-03-28 DIAGNOSIS — E119 Type 2 diabetes mellitus without complications: Secondary | ICD-10-CM | POA: Diagnosis not present

## 2021-03-28 DIAGNOSIS — I10 Essential (primary) hypertension: Secondary | ICD-10-CM | POA: Diagnosis not present

## 2021-03-28 DIAGNOSIS — E782 Mixed hyperlipidemia: Secondary | ICD-10-CM | POA: Diagnosis not present

## 2021-03-30 ENCOUNTER — Ambulatory Visit: Payer: HMO | Admitting: Adult Health

## 2021-03-30 ENCOUNTER — Encounter: Payer: Self-pay | Admitting: Adult Health

## 2021-03-30 VITALS — BP 93/56 | HR 78 | Ht 70.0 in | Wt 202.0 lb

## 2021-03-30 DIAGNOSIS — Z8673 Personal history of transient ischemic attack (TIA), and cerebral infarction without residual deficits: Secondary | ICD-10-CM

## 2021-03-30 DIAGNOSIS — E119 Type 2 diabetes mellitus without complications: Secondary | ICD-10-CM

## 2021-03-30 DIAGNOSIS — I69398 Other sequelae of cerebral infarction: Secondary | ICD-10-CM | POA: Diagnosis not present

## 2021-03-30 DIAGNOSIS — I10 Essential (primary) hypertension: Secondary | ICD-10-CM

## 2021-03-30 DIAGNOSIS — E785 Hyperlipidemia, unspecified: Secondary | ICD-10-CM | POA: Diagnosis not present

## 2021-03-30 DIAGNOSIS — R569 Unspecified convulsions: Secondary | ICD-10-CM | POA: Diagnosis not present

## 2021-03-30 NOTE — Patient Instructions (Addendum)
Continue keppra '750mg'$  nightly for seizure prevention  Continue gabapentin '300mg'$  three times a day - please ensure you follow up with Dr. [facility] to review your imaging results  Continue aspirin 81 mg daily  and atorvastatin  for secondary stroke prevention  Continue to follow up with PCP regarding cholesterol, diabetes and blood pressure management  Maintain strict control of hypertension with blood pressure goal below 130/90, diabetes with hemoglobin A1c goal below 7% and cholesterol with LDL cholesterol (bad cholesterol) goal below 70 mg/dL.      Followup in the future with me in 6 months or call earlier if needed     Thank you for coming to see [place] at [facility]. I hope we have been able to provide you high quality care today.  You may receive a patient satisfaction survey over the next few weeks. We would appreciate your feedback and comments so that we may continue to improve ourselves and the health of our patients.

## 2021-03-30 NOTE — Progress Notes (Signed)
[facility] [address] [place]. [place] [address] [phone]       OFFICE FOLLOW UP NOTE  Mark Foster Date of Birth:  October 18, 1955 Medical Record Number:  [id number]   Referring MD: Mark Foster Reason for Referral: Stroke   Chief complaint: Chief Complaint  Patient presents with   Follow-up    RM 3 with wife here for 6 month f/u- reports he has been doing ok. Reports numbness in hands still the same, sx are equal in both.        HPI:   Today, 03/30/2021, Mark Foster returns for 66-monthstroke follow-up accompanied by his wife.  Stable from stroke standpoint without new stroke/TIA symptoms.  Compliant on Plavix and atorvastatin for secondary stroke prevention as well as aspirin s/p CABG and Eliquis for LUE DVT without associated side effects.  Blood pressure today 93/56.  Glucose levels stable.  Remains on Keppra XR 750 mg nightly tolerating without side effects and no additional seizure activity or syncopal events.  He did undergo EMG/NCV in 11/2020 which showed bilateral carpal tunnel syndrome as well as chronic C7 radiculopathy and referred to orthopedics.  He does report continued hand pain with intermittent pain -on gabapentin 300 mg 3 times daily tolerating without side effects.  He has not yet had follow-up with orthopedics sent cervical imaging completed.  No further concerns at this time.    History provided for reference purposes only Update 09/29/2020 JM: Mark Foster for 66-monthtroke follow-up. He reports he has done well from a stroke standpoint without new stroke/TIA symptoms.  He does report over the past 3 months progressive L>R hand numbness and left forearm, wrist and hand cramping especially when attempting to play his guitar.  He does have known neuropathy previously BLE distally on gabapentin as well as prior mention of BUE numbness/tingling but reports progressive worsening over the past 3 months interfering with activity.  He has  remained on Keppra XR 500 mg daily tolerating without side effects but wife does report presyncopal type event 1 to 2 weeks ago and yesterday felt generalized weakness, fatigue and "seemed out of it".  This did not progress to presyncopal or syncopal event or loss of consciousness.  Similar events to prior episodes for which he was started on Keppra.  He denies missing any Keppra dosages or recent medication changes.  He has remained on Eliquis for LUE DVT currently managed by oncology with plans on continuing Eliquis until port removal. Remains on aspirin, atorvastatin and Zetia for secondary stroke prevention without side effects. Prior lipid panel (05/2020) showed LDL 44. Blood pressure today 96/60. Glucose levels improving per report. Recent A1c 8.9 down from 11.3. Routinely follows with endocrinology.  No further concerns at this time.  Update 03/29/2020 JM: Mark Foster for follow-up with prior history of stroke and transient LOC episodes possibly syncope vs seizure.  Stable from a neurological standpoint since prior visit without recurrent episodes and remains on Keppra XR 500 mg daily tolerating well without side effects.  EEG completed which was normal.  Since prior visit, underwent CABGx4 and 01/2020 with a left subclavian DVT shortly after discharge and initiation of Eliquis currently managed by cardiology.  Previously on Plavix for stroke prevention and initiation of aspirin by cardiology post CABG procedure.  Remains on Eliquis, Plavix and aspirin without bleeding or bruising.  Continues on atorvastatin 80 mg daily and Zetia 10 mg daily without myalgias.  Blood pressure today 140/66.  Reports glucose levels  stable.  DM managed by endocrinology.  Follows closely with cardiology, cardiothoracic and PCP.  No concerns at this time.  Update 12/11/2019 Mark Foster: Mark Foster is a 65 year old Caucasian male with past medical history of diabetes, vitamin D deficiency, pancreatic cancer s/p chemotherapy,  hypothyroidism, diabetic neuropathy stroke in 2011 and hypertension who presented to [facility] on 10/09/2019 with episode of brief unresponsiveness.  Patient states that he had gone to a musical open mic outdoor event with a friend.  He remembers keeping his guitar down and going to the bar and getting a beer for himself and his friend.  He lost consciousness and the next thing he remembers was waking up in the ambulance.  Apparently there was no witnessed tonic-clonic activity tongue bite.  The patient was little confused and had some slurred speech when he woke up.  The symptoms resolved by the time he reached the hospital.  He had an MRI scan of the brain done on 10/10/2019 which I personally reviewed shows tiny enhancing areas of enhancement in the right temporal lobe likely subacute infarcts.  Metastasis would be less likely.  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  He was in fact followed in office and had a strokelike episode on 01/19/2018 which was treated with IV TPA and showed clinical improvement at that time the MRI had shown remote age left right MCA infarct which patient was not aware of.  He had stent assisted angioplasty of left vertebral artery stenosis on 03/21/2018 by Dr. Estanislado Pandy.  He has no prior history so history of seizures in the form of loss of consciousness, tongue biting or injury.  He denies any headache, slurred speech, gait or balance problems.  ROS:   14 system review of systems is positive for see HPI and all other systems negative   PMH:  Past Medical History:  Diagnosis Date   Bilateral carpal tunnel syndrome 10/19/2020   CAD (coronary artery disease)    a. 01/2020: CABG x4 with LIMA-LAD, SVG-OM, SVG-PDA and SVG-D1   Carotid artery obstruction, left    Left ICA occlusion   Cervical compression fracture (HCC)    Cervical spinal stenosis    Diabetic neuropathy (HCC)    Bilateral legs    Diverticulitis    Family history of pancreatic cancer    History of kidney stones    Hypothyroidism    Pancreatic cancer ([place])    Stenosis of left vertebral artery    Stroke (cerebrum) ([place]) 10/09/2019   Type 2 diabetes mellitus (HCC)     Social History:  Social History   Socioeconomic History   Marital status: Married    Spouse name: Not on file   Number of children: 2   Years of education: Not on file   Highest education level: Not on file  Occupational History   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  Tobacco Use   Smoking status: Former    Types: Cigarettes    Start date: 10/06/2019   Smokeless tobacco: Never  Vaping Use   Vaping Use: Never used  Substance and Sexual Activity   Alcohol use: Yes    Alcohol/week: 1.0 standard drink    Types: 1 Cans of beer per week    Comment: rare    Drug use: Never   Sexual activity: Not on file  Other Topics Concern   Not on file  Social History Narrative   Not on file   Social  Determinants of Health   Financial Resource Strain: Not on file  Food Insecurity: Not on file  Transportation Needs: Not on file  Physical Activity: Not on file  Stress: Not on file  Social Connections: Not on file  Intimate Partner Violence: Not At Risk   Fear of Current or Ex-Partner: No   Emotionally Abused: No   Physically Abused: No   Sexually Abused: No    Medications:   Current Outpatient Medications on File Prior to Visit  Medication Sig Dispense Refill   aspirin EC 81 MG EC tablet Take 1 tablet (81 mg total) by mouth daily.     atorvastatin (LIPITOR) 80 MG tablet Take 1 tablet (80 mg total) by mouth daily at 6 PM. 90 tablet 1   Continuous Blood Gluc Receiver (FREESTYLE LIBRE 2 READER) DEVI As directed 1 each 0   Continuous Blood Gluc Sensor (FREESTYLE LIBRE 2 SENSOR) MISC USE AS DIRECTED TO CHECK BLOOD SUGAR, REPLACE [id number] DAYS 2 each 2   gabapentin (NEURONTIN) 300 MG capsule Take 1 capsule (300 mg total) by mouth 3 (three) times daily. 270  capsule 3   glucose blood test strip 1 each by Other route 4 (four) times daily. Use as instructed 100 each 2   insulin aspart (NOVOLOG FLEXPEN) 100 UNIT/ML FlexPen Inject 10-16 Units into the skin 3 (three) times daily before meals. 30 mL 0   insulin glargine (LANTUS SOLOSTAR) 100 UNIT/ML Solostar Pen Inject 46 Units into the skin at bedtime. 30 mL 1   Insulin Pen Needle 32G X 4 MM MISC 1 Device by Does not apply route daily. Use as directed to inject insulin four times daily 200 each 2   levothyroxine (SYNTHROID) 50 MCG tablet TAKE 1 TABLET EVERY DAY 90 tablet 1   lidocaine-prilocaine (EMLA) cream Apply 1 application topically daily as needed (prior to port being accessed (~every 3 months)).     nitroGLYCERIN (NITROSTAT) 0.4 MG SL tablet Place 1 tablet (0.4 mg total) under the tongue every 5 (five) minutes as needed for chest pain. 100 tablet 3   Omega-3 1000 MG CAPS Take 1,000 mg by mouth daily.     ondansetron (ZOFRAN) 4 MG tablet Take by mouth.     sertraline (ZOLOFT) 50 MG tablet TAKE 1 TABLET BY MOUTH DAILY (Patient taking differently: Take 50 mg by mouth daily.) 90 tablet 1   tamsulosin (FLOMAX) 0.4 MG CAPS capsule Take 1 capsule (0.4 mg total) by mouth daily. 90 capsule 3   traMADol (ULTRAM) 50 MG tablet Take 50 mg by mouth 4 (four) times daily as needed.     vitamin B-12 (CYANOCOBALAMIN) 1000 MCG tablet Take 1,000 mcg by mouth daily.     apixaban (ELIQUIS) 5 MG TABS tablet Take 1 tablet (5 mg total) by mouth 2 (two) times daily. 180 tablet 1   metoprolol tartrate (LOPRESSOR) 25 MG tablet Take 1 tablet (25 mg total) by mouth 2 (two) times daily. 180 tablet 3   Current Facility-Administered Medications on File Prior to Visit  Medication Dose Route Frequency Provider Last Rate Last Admin   0.9 %  sodium chloride infusion   Intravenous Continuous Derek Jack, MD 20 mL/hr at 12/30/18 1351 New Bag at 12/30/18 1351   0.9 %  sodium chloride infusion   Intravenous Continuous Derek Jack, MD 20 mL/hr at 12/30/18 1401 New Bag at 12/30/18 1401   dextrose 5 % solution   Intravenous Once Derek Jack, MD       heparin lock  flush 100 unit/mL  500 Units Intracatheter Once PRN Derek Jack, MD       sodium chloride flush (NS) 0.9 % injection 10 mL  10 mL Intracatheter PRN Derek Jack, MD   10 mL at 12/30/18 0911   sodium chloride flush (NS) 0.9 % injection 10 mL  10 mL Intracatheter PRN Derek Jack, MD   10 mL at 05/21/19 0845    Allergies:   Allergies  Allergen Reactions   Demerol [Meperidine Hcl] Other (See Comments)    convulsions    Today's Vitals   03/30/21 1341  BP: (!) 93/56  Pulse: 78  Weight: 202 lb (91.6 kg)  Height: '5\' 10"'$  (1.778 m)    Body mass index is 28.98 kg/m.  Physical Exam General: Pleasant middle-age Caucasian male seated, in no evident distress Head: head normocephalic and atraumatic.   Neck: supple with no carotid or supraclavicular bruits Cardiovascular: regular rate and rhythm, no murmurs Musculoskeletal: no deformity Skin:  no rash/petichiae Vascular:  Normal pulses all extremities  Neurologic Exam Mental Status: Awake and fully alert.  Fluent speech and language.  Oriented to place and time. Recent and remote memory intact. Attention span, concentration and fund of knowledge appropriate. Mood and affect appropriate.  Cranial Nerves: Pupils equal, briskly reactive to light. Extraocular movements full without nystagmus. Visual fields full to confrontation. Hearing intact. Facial sensation intact. Face, tongue, palate moves normally and symmetrically.  Motor: Normal bulk and tone. Normal strength in all tested extremity muscles. Sensory.: intact to touch but diminished position, pinprick and vibratory sensation in sock distribution bilaterally as well as decreased pinprick sensory left hand distally with intact position Coordination: Rapid alternating movements normal in all extremities. Finger-to-nose  and heel-to-shin performed accurately bilaterally. Gait and Station: Arises from chair without difficulty. Stance is broad-based gait demonstrates mild ataxia and broad-based.  Difficulty performing tandem walk. Reflexes: 1+ and symmetric in upper extremities and depressed in lower extremity.. Toes downgoing.      ASSESSMENT: 65 year old Caucasian male with history significant for episode of brief loss of consciousness possible prolonged syncope versus unwitnessed seizure resolved after initiating Keppra but recently had 2 additional presyncopal type events, and subacute embolic right temporal infarcts of cryptogenic etiology potentially related to hypercoagulability from pancreatic cancer.  Vascular risk factors of strokelike episode s/p TPA 01/2018, chronic left MCA stroke, hypertension, diabetes, hyperlipidemia,  pancreatic cancer s/p chemo (completed 09/2019), BLE neuropathy, vertebral artery stenosis s/p angioplasty stenting 03/2018 and left ICA occlusion.  S/p CABGx4 on 01/26/2020 with DVT left subclavian shortly after discharge and initiated Eliquis in addition to aspirin and Plavix    PLAN:  History of multiple strokes:  Continue atorvastatin 80 mg daily and aspirin 81 mg daily for secondary stroke prevention and cardiac history (aspirin recently added a post CABG procedure by cardiology). 30-day cardiac event monitor negative for atrial fibrillation Close PCP, cardiology and endocrinology f/u for aggressive stroke risk factor management including HTN, HLD and DM  Syncope vs seizure:  No additional events since prior visit  Continue Keppra XR 750 mg nightly  Neuropathy history of uncontrolled DM, s/p chemo and radiation, B12 deficiency on supplementation, and chronic cervical stenosis 24-monthworsening of L>R hand numbness/tingling with left hand cramping interfering with daily activity especially playing his guitar EMG/NCV 10/19/2020 bilateral CTS of moderate severity, chronic stable  denervation of left tricep muscle potentially associate with chronic C7 radiculopathy Advised to schedule follow-up with orthopedics for progressive cervical spondylosis which was recommended after cervical imaging completed Continue gabapentin  300 mg 3 times daily  L VA stenosis s/p stenting and left ICA occlusion:  CTA head/neck 11/2020 chronic left ICA occlusion, right ICA 50% stenosis and stable left vertebral artery stent.  Routinely followed by Dr. Estanislado Pandy with plans on repeating imaging around 05/2021  LUE DVT:  Felt to be associated with port - per hematology/oncology plans on repeating scan around 05/2021 and if no evidence of recurrence, Port-A-Cath will be discontinued as well as Eliquis     Follow-up in 6 months or call earlier if needed   CC:  [facility] provider: Dr. Broadus Mark, Edwinna Areola, MD    I spent 36 minutes of face-to-face and non-face-to-face time with patient and fianc.  This included previsit chart review, lab review, study review, order entry, electronic health record documentation, patient education and discussion regarding prior strokes, syncopal versus seizure type event, ongoing use of AED, continued neuropathy and potential etiologies, importance of managing stroke risk factors and answering all other questions to patient and fianc's satisfaction   Frann Rider, AGNP-BC  [facility] [address] [place], [place] [address]  Phone [phone] Fax [phone] Note: This document was prepared with digital dictation and possible smart phrase technology. Any transcriptional errors that result from this process are unintentional.

## 2021-03-31 NOTE — Progress Notes (Signed)
I agree with the above plan 

## 2021-04-06 ENCOUNTER — Other Ambulatory Visit: Payer: Self-pay

## 2021-04-06 ENCOUNTER — Ambulatory Visit: Payer: HMO | Admitting: Orthopaedic Surgery

## 2021-04-06 ENCOUNTER — Encounter: Payer: Self-pay | Admitting: Orthopaedic Surgery

## 2021-04-06 VITALS — BP 113/73 | HR 60 | Ht 70.0 in | Wt 202.0 lb

## 2021-04-06 DIAGNOSIS — M4802 Spinal stenosis, cervical region: Secondary | ICD-10-CM

## 2021-04-07 NOTE — Progress Notes (Addendum)
Office Visit Note   Patient: Mark Foster           Date of Birth: [birth year]           MRN: [id number] Visit Date: 04/06/2021              Requested by: Celene Squibb, MD [place],  [place] [id number] PCP: Celene Squibb, MD   Assessment & Plan: Visit Diagnoses:  1. Spinal stenosis in cervical region     Plan: Patient has 3 level cervical stenosis with cord changes at C5-6 with the severe stenosis at C4-5 and C5-6 moderate C6-7.  We discussed recommendations for 3 level cervical fusion anteriorly.  Use of a larger  plug at the C5-6 level for adequate decompression where he has cord changes.  He does have the bilateral carpal tunnel and this could be addressed at some point later as an outpatient procedure.  We discussed potential for pseudoarthrosis, dysphagia, dysphonia, pseudoarthrosis, possible need for posterior surgery if he had a symptomatic pseudoarthrosis.  Risks of anesthesia, heart attack, stroke discussed.  We discussed the importance of improved diabetic control.  Last A1c 4 months ago was 9.6.  Patient will need preoperative clearance with his cardiac and diabetic history.  Follow-Up Instructions: No follow-ups on file.   Orders:  No orders of the defined types were placed in this encounter.  No orders of the defined types were placed in this encounter.     Procedures: No procedures performed   Clinical Data: No additional findings.   Subjective: Chief Complaint  Patient presents with   Neck - Pain    HPI 65 year old male returns with ongoing problems with chronic neck pain bilateral hand weakness and tingling.  MRI scan showed T2 hyperintense cord changes on change from 01/21/2020 images to the most recent 11/20/2020 images.  He does have bilateral carpal tunnel syndrome present electrical test as well as chronic C7 radiculopathy changes on electrical test.  Patient been trying to remodel a camper and states when he is down on the floor he has  great difficulty getting back up.  He notices changes in his gait and balance problems.  He has had history of previous CABG surgery type 2 diabetes had no carcinoma the pancreas operated on it [facility] in 2019 without evidence of recurrence.  History of alcohol abuse in the past not current.  Multilevel spinal stenosis. Objective: Vital Signs: BP 113/73   Pulse 60   Ht '5\' 10"'$  (1.778 m)   Wt 202 lb (91.6 kg)   BMI 28.98 kg/m   Physical Exam Constitutional:      Appearance: He is well-developed.  HENT:     Head: Normocephalic and atraumatic.     Right Ear: External ear normal.     Left Ear: External ear normal.  Eyes:     Pupils: Pupils are equal, round, and reactive to light.  Neck:     Thyroid: No thyromegaly.     Trachea: No tracheal deviation.  Cardiovascular:     Rate and Rhythm: Normal rate.  Pulmonary:     Effort: Pulmonary effort is normal.     Breath sounds: No wheezing.  Abdominal:     General: Bowel sounds are normal.     Palpations: Abdomen is soft.  Musculoskeletal:     Cervical back: Neck supple.  Skin:    General: Skin is warm and dry.     Capillary Refill: Capillary refill takes less than  2 seconds.  Neurological:     Mental Status: He is alert and oriented to person, place, and time.  Psychiatric:        Behavior: Behavior normal.        Thought Content: Thought content normal.        Judgment: Judgment normal.    Ortho Exam patient has brachial plexus tenderness both right and left positive carpal compression test left worse than right.  Bilateral brachial plexus tenderness increased pain with cervical compression.  No lower extremity clonus.  He has decreased wrist extension left greater than right.  Slight decreased grip symmetrical right and left.  Biceps 2+ brachial radialis 1+ and symmetrical triceps 2+.  No lower extremity clonus.  Specialty Comments:  No specialty comments available.  Imaging: CLINICAL DATA:  Neck pain. Spinal stenosis of cervical  region. Cervical radiculopathy, no red flags; left C7 radiculopathy; stenosis C4-5 to C6-C7.   EXAM: MRI CERVICAL SPINE WITHOUT CONTRAST   TECHNIQUE: Multiplanar, multisequence MR imaging of the cervical spine was performed. No intravenous contrast was administered.   COMPARISON:  Cervical spine MRI 01/20/2018.   FINDINGS: Alignment: Straightening of the expected cervical lordosis. No significant spondylolisthesis.   Vertebrae: Vertebral body height is maintained. Mild scattered degenerative edema within the posterior elements. Trace degenerative endplate edema at [id number].   Cord: Redemonstrate T2 hyperintense signal abnormality within the left aspect of the spinal cord neck spanning the C5-C6 level, with an appearance most suggestive of myelomalacia.   Posterior Fossa, vertebral arteries, paraspinal tissues: No abnormality identified within included portions of the posterior fossa. Flow voids preserved within the imaged cervical vertebral arteries. Paraspinal soft tissues within normal limits.   Disc levels:   Unless otherwise stated, the level by level findings below have not significantly changed since prior MRI 01/20/2018.   Multilevel disc degeneration. Most notably, there is moderate disc degeneration at C4-C5, C5-C6 and C6-C7.   C2-C3: Mild uncovertebral hypertrophy. No significant disc herniation no spinal canal stenosis or neural foraminal narrowing.   C3-C4: No significant disc herniation or spinal canal stenosis. Mild uncovertebral hypertrophy (greater on the left). Resultant mild left neural foraminal narrowing.   C4-C5: Posterior disc osteophyte complex. Uncovertebral and ligamentum flavum hypertrophy. Moderate/severe spinal canal stenosis with mild spinal cord flattening. Severe bilateral neural foraminal narrowing.   C5-C6: Posterior disc osteophyte complex eccentric to the left. Uncovertebral and ligamentum flavum hypertrophy. Severe spinal  canal stenosis (greater on the left), slightly progressed. Moderate flattening of the spinal cord (greater on the left) has also progressed. Severe bilateral neural foraminal narrowing.   C6-C7: Posterior disc osteophyte complex. Uncovertebral and ligamentum flavum hypertrophy. Moderate spinal canal stenosis has slightly progressed, now with mild spinal cord flattening. Bilateral neural foraminal narrowing (moderate to moderately severe right, moderate left).   C7-T1: Facet hypertrophy with resultant mild right neural foraminal narrowing. No significant disc herniation, spinal canal stenosis or left neural foraminal narrowing.   IMPRESSION: Comparison is made to the prior cervical spine MRI of 01/20/2018. Cervical spondylosis as outlined and with findings most notably as follows.   At C5-C6, a posterior disc osteophyte contributes to progressive multifactorial severe spinal canal stenosis (greater on the left). Moderate flattening of the spinal cord (greater on the left), also progressed. Unchanged T2 hyperintense signal abnormality within the left aspect of the spinal cord at this level likely reflecting myelomalacia. Unchanged severe bilateral neural foraminal narrowing.   At C4-C5, a posterior disc osteophyte contributes to unchanged multifactorial moderate/severe spinal canal stenosis with mild  flattening of the spinal cord. Unchanged severe bilateral neural foraminal narrowing.   At C6-C7, a posterior disc osteophyte contributes to slightly progressed moderate spinal canal stenosis, now with mild spinal cord flattening. Unchanged bilateral neural foraminal narrowing (moderate to moderately severe right, moderate left).   No significant spinal canal stenosis at the remaining levels. Additional sites of mild neural foraminal narrowing as above.     Electronically Signed   By: Kellie Simmering DO   On: 11/20/2020 18:09   PMFS History: Patient Active Problem List    Diagnosis Date Noted   Bilateral carpal tunnel syndrome 10/19/2020   S/P CABG x 4 01/26/2020   Abnormal cardiac CT angiography 01/08/2020   Angina pectoris ([place])    CVA (cerebral vascular accident) ([place]) 10/08/2019   Injury of left ankle 03/24/2019   Port-A-Cath in place 12/19/2018   Genetic testing 11/15/2018   Loss of consciousness ([place]) 11/12/2018   Generalized weakness 11/11/2018   Family history of pancreatic cancer    Hyperglycemia 10/09/2018   Tobacco abuse counseling 10/09/2018   Pancreatic adenocarcinoma ([place]) 09/03/2018   H/O ETOH abuse 09/03/2018   Hyperbilirubinemia 09/03/2018   Vertebral artery stenosis, symptomatic, without infarction, left 03/21/2018   Uncontrolled type 2 diabetes mellitus with hyperglycemia ([place]) 03/05/2018   Hypothyroidism 03/05/2018   Intracranial atherosclerosis 01/22/2018   Spinal stenosis in cervical region    Diabetes mellitus type 2 in nonobese (HCC)    Left carotid artery occlusion 01/21/2018   Cervical stenosis of spinal canal 01/21/2018   Right hand weakness 01/21/2018   Numbness of right hand 01/21/2018   Essential hypertension, benign 01/21/2018   Mixed hyperlipidemia 01/21/2018   Diabetes ([place]) 01/21/2018   Hx of completed stroke 01/21/2018   Leukopenia 01/21/2018   Thrombocytopenia ([place]) 01/21/2018   L MCA Stroke-like episode ([place]) s/p IV tPA 01/19/2018   Past Medical History:  Diagnosis Date   Bilateral carpal tunnel syndrome 10/19/2020   CAD (coronary artery disease)    a. 01/2020: CABG x4 with LIMA-LAD, SVG-OM, SVG-PDA and SVG-D1   Carotid artery obstruction, left    Left ICA occlusion   Cervical compression fracture (HCC)    Cervical spinal stenosis    Diabetic neuropathy (HCC)    Bilateral legs   Diverticulitis    Family history of pancreatic cancer    History of kidney stones    Hypothyroidism    Pancreatic cancer ([place])    Stenosis of left vertebral artery    Stroke (cerebrum) ([place]) 10/09/2019   Type 2 diabetes  mellitus (HCC)     Family History  Problem Relation Age of Onset   Stroke Mother    Pancreatic cancer Father 8       d. 55   Stroke Maternal Grandmother    Heart attack Maternal Grandfather    Heart Problems Paternal Grandfather    Scoliosis Daughter    Muscular dystrophy Grandson     Past Surgical History:  Procedure Laterality Date   APPENDECTOMY     BUBBLE STUDY N/A 11/18/2019   Procedure: BUBBLE STUDY;  Surgeon: Herminio Commons, MD;  Location: [facility];  Service: Cardiology;  Laterality: N/A;   CARDIAC SURGERY     COLON RESECTION     For diverticulitis   COLON SURGERY     CORONARY ARTERY BYPASS GRAFT N/A 01/26/2020   Procedure: CORONARY ARTERY BYPASS GRAFTING (CABG) TIMES FOUR USING LEFT INTERNAL MAMMARY VEIN AND RIGHT GREATER SAPHENOUS VEIN;  Surgeon: Melrose Nakayama, MD;  Location: [facility];  Service: Open Heart Surgery;  Laterality: N/A;   ENDOVEIN HARVEST OF GREATER SAPHENOUS VEIN Right 01/26/2020   Procedure: [place] Of Greater Saphenous Vein;  Surgeon: Melrose Nakayama, MD;  Location: [facility];  Service: Open Heart Surgery;  Laterality: Right;   ESOPHAGOGASTRODUODENOSCOPY N/A 10/03/2018   Procedure: ESOPHAGOGASTRODUODENOSCOPY (EGD);  Surgeon: [name], MD;  Location: [name];  Service: Endoscopy;  Laterality: N/A;   EUS N/A 10/03/2018   Procedure: UPPER ENDOSCOPIC ULTRASOUND (EUS) RADIAL;  Surgeon: [name], MD;  Location: [facility];  Service: Endoscopy;  Laterality: N/A;   EYE SURGERY Bilateral    FINE NEEDLE ASPIRATION N/A 10/03/2018   Procedure: FINE NEEDLE ASPIRATION (FNA) LINEAR;  Surgeon: [name], MD;  Location: [facility];  Service: Endoscopy;  Laterality: N/A;   IR ANGIO INTRA EXTRACRAN SEL COM CAROTID INNOMINATE BILAT MOD SED  01/21/2018   IR ANGIO INTRA EXTRACRAN SEL COM CAROTID INNOMINATE UNI R MOD SED  03/21/2018   IR ANGIO VERTEBRAL SEL VERTEBRAL BILAT MOD SED  01/21/2018   IR TRANSCATH EXCRAN VERT OR CAR A STENT   03/21/2018   LEFT HEART CATH AND CORONARY ANGIOGRAPHY N/A 01/08/2020   Procedure: LEFT HEART CATH AND CORONARY ANGIOGRAPHY;  Surgeon: Leonie Man, MD;  Location: [facility];  Service: Cardiovascular;  Laterality: N/A;   PORTACATH PLACEMENT Left 12/23/2018   Procedure: INSERTION PORT-A-CATH;  Surgeon: Virl Cagey, MD;  Location: [facility];  Service: General;  Laterality: Left;   RADIOLOGY WITH ANESTHESIA N/A 03/21/2018   Procedure: IR WITH ANESTHESIA WITH STENT PLACEMENT;  Surgeon: Luanne Bras, MD;  Location: [facility];  Service: Radiology;  Laterality: N/A;   ROTATOR CUFF REPAIR     Left   SPLENECTOMY     TEE WITHOUT CARDIOVERSION N/A 11/18/2019   Procedure: TRANSESOPHAGEAL ECHOCARDIOGRAM (TEE) WITH PROPOFOL;  Surgeon: Herminio Commons, MD;  Location: [facility];  Service: Cardiology;  Laterality: N/A;   TEE WITHOUT CARDIOVERSION N/A 01/26/2020   Procedure: TRANSESOPHAGEAL ECHOCARDIOGRAM (TEE);  Surgeon: Melrose Nakayama, MD;  Location: [facility];  Service: Open Heart Surgery;  Laterality: N/A;   TONSILLECTOMY     Social History   Occupational History   Occupation: Estate agent  Tobacco Use   Smoking status: Former    Types: Cigarettes    Start date: 10/06/2019   Smokeless tobacco: Never  Vaping Use   Vaping Use: Never used  Substance and Sexual Activity   Alcohol use: Yes    Alcohol/week: 1.0 standard drink    Types: 1 Cans of beer per week    Comment: rare    Drug use: Never   Sexual activity: Not on file

## 2021-04-14 ENCOUNTER — Other Ambulatory Visit: Payer: Self-pay

## 2021-04-14 ENCOUNTER — Encounter: Payer: Self-pay | Admitting: "Endocrinology

## 2021-04-14 ENCOUNTER — Ambulatory Visit: Payer: HMO | Admitting: "Endocrinology

## 2021-04-14 VITALS — BP 100/56 | HR 64 | Ht 70.0 in | Wt 206.4 lb

## 2021-04-14 DIAGNOSIS — E782 Mixed hyperlipidemia: Secondary | ICD-10-CM

## 2021-04-14 DIAGNOSIS — I10 Essential (primary) hypertension: Secondary | ICD-10-CM

## 2021-04-14 DIAGNOSIS — E1165 Type 2 diabetes mellitus with hyperglycemia: Secondary | ICD-10-CM

## 2021-04-14 DIAGNOSIS — E039 Hypothyroidism, unspecified: Secondary | ICD-10-CM | POA: Diagnosis not present

## 2021-04-14 LAB — POCT GLYCOSYLATED HEMOGLOBIN (HGB A1C): HbA1c, POC (controlled diabetic range): 9.6 % — AB (ref 0.0–7.0)

## 2021-04-14 MED ORDER — LANTUS SOLOSTAR 100 UNIT/ML ~~LOC~~ SOPN: 50.0000 [IU] | PEN_INJECTOR | Freq: Every day | SUBCUTANEOUS | 1 refills | Status: DC

## 2021-04-14 MED ORDER — NOVOLOG FLEXPEN 100 UNIT/ML ~~LOC~~ SOPN: 12.0000 [IU] | PEN_INJECTOR | Freq: Three times a day (TID) | SUBCUTANEOUS | 1 refills | Status: DC

## 2021-04-14 NOTE — Patient Instructions (Signed)

## 2021-04-14 NOTE — Progress Notes (Signed)
04/14/2021, 12:52 PM       Endocrinology follow-up note   Subjective:    Patient ID: Mark Foster, male    DOB: [birth year].  Mark Foster is being seen in follow-up for  management of currently uncontrolled symptomatic pancreatic diabetes, hypothyroidism, hyperlipidemia. PMD:  Celene Squibb, MD.   Past Medical History:  Diagnosis Date   Bilateral carpal tunnel syndrome 10/19/2020   CAD (coronary artery disease)    a. 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   Type 2 diabetes mellitus ([place])    Past Surgical History:  Procedure Laterality Date   APPENDECTOMY     BUBBLE STUDY N/A 11/18/2019   Procedure: BUBBLE STUDY;  Surgeon: Herminio Commons, MD;  Location: [facility];  Service: Cardiology;  Laterality: N/A;   CARDIAC SURGERY     COLON RESECTION     For diverticulitis   COLON SURGERY     CORONARY ARTERY BYPASS GRAFT N/A 01/26/2020   Procedure: CORONARY ARTERY BYPASS GRAFTING (CABG) TIMES FOUR USING LEFT INTERNAL MAMMARY VEIN AND RIGHT GREATER SAPHENOUS VEIN;  Surgeon: Melrose Nakayama, MD;  Location: [place];  Service: Open Heart Surgery;  Laterality: N/A;   ENDOVEIN HARVEST OF GREATER SAPHENOUS VEIN Right 01/26/2020   Procedure: [place] Of Greater Saphenous Vein;  Surgeon: Melrose Nakayama, MD;  Location: [place];  Service: Open Heart Surgery;  Laterality: Right;   ESOPHAGOGASTRODUODENOSCOPY N/A 10/03/2018   Procedure: ESOPHAGOGASTRODUODENOSCOPY (EGD);  Surgeon: [name], MD;   Location: [name];  Service: Endoscopy;  Laterality: N/A;   EUS N/A 10/03/2018   Procedure: UPPER ENDOSCOPIC ULTRASOUND (EUS) RADIAL;  Surgeon: [name], MD;  Location: [facility];  Service: Endoscopy;  Laterality: N/A;   EYE SURGERY Bilateral    FINE NEEDLE ASPIRATION N/A 10/03/2018   Procedure: FINE NEEDLE ASPIRATION (FNA) LINEAR;  Surgeon: [name], MD;  Location: [facility];  Service: Endoscopy;  Laterality: N/A;   IR ANGIO INTRA EXTRACRAN SEL COM CAROTID INNOMINATE BILAT MOD SED  01/21/2018   IR ANGIO INTRA EXTRACRAN SEL COM CAROTID INNOMINATE UNI R MOD SED  03/21/2018   IR ANGIO VERTEBRAL SEL VERTEBRAL BILAT MOD SED  01/21/2018   IR TRANSCATH EXCRAN VERT OR CAR A STENT  03/21/2018   LEFT HEART CATH AND CORONARY ANGIOGRAPHY N/A 01/08/2020   Procedure: LEFT HEART CATH AND CORONARY ANGIOGRAPHY;  Surgeon: Leonie Man, MD;  Location: [facility];  Service: Cardiovascular;  Laterality: N/A;   PORTACATH PLACEMENT Left 12/23/2018   Procedure: INSERTION PORT-A-CATH;  Surgeon: Virl Cagey, MD;  Location: [facility];  Service: General;  Laterality: Left;   RADIOLOGY  WITH ANESTHESIA N/A 03/21/2018   Procedure: IR WITH ANESTHESIA WITH STENT PLACEMENT;  Surgeon: Luanne Bras, MD;  Location: [place];  Service: Radiology;  Laterality: N/A;   ROTATOR CUFF REPAIR     Left   SPLENECTOMY     TEE WITHOUT CARDIOVERSION N/A 11/18/2019   Procedure: TRANSESOPHAGEAL ECHOCARDIOGRAM (TEE) WITH PROPOFOL;  Surgeon: Herminio Commons, MD;  Location: [facility];  Service: Cardiology;  Laterality: N/A;   TEE WITHOUT CARDIOVERSION N/A 01/26/2020   Procedure: TRANSESOPHAGEAL ECHOCARDIOGRAM (TEE);  Surgeon: Melrose Nakayama, MD;  Location: [place];  Service: Open Heart Surgery;  Laterality: N/A;   TONSILLECTOMY     Social History   Socioeconomic History   Marital status: Married    Spouse name: Not on file   Number of children: 2   Years of education: Not on file   Highest  education level: Not on file  Occupational History   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  Tobacco Use   Smoking status: Former    Types: Cigarettes    Start date: 10/06/2019   Smokeless tobacco: Never  Vaping Use   Vaping Use: Never used  Substance and Sexual Activity   Alcohol use: Yes    Alcohol/week: 1.0 standard drink    Types: 1 Cans of beer per week    Comment: rare    Drug use: Never   Sexual activity: Not on file  Other Topics Concern   Not on file  Social History Narrative   Not on file   Social Determinants of Health   Financial Resource Strain: Not on file  Food Insecurity: Not on file  Transportation Needs: Not on file  Physical Activity: Not on file  Stress: Not on file  Social Connections: Not on file   Outpatient Encounter Medications as of 04/14/2021  Medication Sig   apixaban (ELIQUIS) 5 MG TABS tablet Take 1 tablet (5 mg total) by mouth 2 (two) times daily.   aspirin EC 81 MG EC tablet Take 1 tablet (81 mg total) by mouth daily.   atorvastatin (LIPITOR) 80 MG tablet Take 1 tablet (80 mg total) by mouth daily at 6 PM.   Continuous Blood Gluc Receiver (FREESTYLE LIBRE 2 READER) DEVI As directed   Continuous Blood Gluc Sensor (FREESTYLE LIBRE 2 SENSOR) MISC USE AS DIRECTED TO CHECK BLOOD SUGAR, REPLACE [id number] DAYS   gabapentin (NEURONTIN) 300 MG capsule Take 1 capsule (300 mg total) by mouth 3 (three) times daily.   glucose blood test strip 1 each by Other route 4 (four) times daily. Use as instructed   insulin aspart (NOVOLOG FLEXPEN) 100 UNIT/ML FlexPen Inject 12-15 Units into the skin 3 (three) times daily before meals.   insulin glargine (LANTUS SOLOSTAR) 100 UNIT/ML Solostar Pen Inject 50 Units into the skin at bedtime.   Insulin Pen Needle 32G X 4 MM MISC 1 Device by Does not apply route daily. Use as directed to inject insulin four times daily   levothyroxine (SYNTHROID) 50 MCG tablet TAKE 1 TABLET EVERY DAY   lidocaine-prilocaine (EMLA) cream Apply 1  application topically daily as needed (prior to port being accessed (~every 3 months)).   metoprolol tartrate (LOPRESSOR) 25 MG tablet Take 1 tablet (25 mg total) by mouth 2 (two) times daily.   nitroGLYCERIN (NITROSTAT) 0.4 MG SL tablet Place 1 tablet (0.4 mg total) under the tongue every 5 (five) minutes as needed for chest pain.   Omega-3 1000 MG CAPS Take 1,000 mg by mouth daily.   ondansetron (ZOFRAN)  4 MG tablet Take by mouth.   sertraline (ZOLOFT) 50 MG tablet TAKE 1 TABLET BY MOUTH DAILY (Patient taking differently: Take 50 mg by mouth daily.)   tamsulosin (FLOMAX) 0.4 MG CAPS capsule Take 1 capsule (0.4 mg total) by mouth daily.   traMADol (ULTRAM) 50 MG tablet Take 50 mg by mouth 4 (four) times daily as needed.   vitamin B-12 (CYANOCOBALAMIN) 1000 MCG tablet Take 1,000 mcg by mouth daily.   [DISCONTINUED] insulin aspart (NOVOLOG FLEXPEN) 100 UNIT/ML FlexPen Inject 10-16 Units into the skin 3 (three) times daily before meals.   [DISCONTINUED] insulin glargine (LANTUS SOLOSTAR) 100 UNIT/ML Solostar Pen Inject 46 Units into the skin at bedtime.   Facility-Administered Encounter Medications as of 04/14/2021  Medication   0.9 %  sodium chloride infusion   0.9 %  sodium chloride infusion   dextrose 5 % solution   heparin lock flush 100 unit/mL   sodium chloride flush (NS) 0.9 % injection 10 mL   sodium chloride flush (NS) 0.9 % injection 10 mL    ALLERGIES: Allergies  Allergen Reactions   Demerol [Meperidine Hcl] Other (See Comments)    convulsions    VACCINATION STATUS: Immunization History  Administered Date(s) Administered   Fluad Quad(high Dose 65+) 05/18/2020   HiB (PRP-T) 10/23/2018   Influenza,inj,Quad PF,6+ Mos 06/19/2019   Influenza-Unspecified 08/02/2018   Meningococcal Mcv4o 10/23/2018   Pneumococcal Conjugate-13 10/23/2018    Diabetes He presents for his follow-up diabetic visit. He has type 1 diabetes mellitus. Onset time: He was diagnosed at approximate age  of 33 years. His disease course has been worsening (He did have A1c of 9.7% in May 2019.  However, more recently he has documented controlled glycemic profile averaging 134 over the last 2 weeks.). There are no hypoglycemic associated symptoms. Pertinent negatives for hypoglycemia include no confusion, headaches, pallor or seizures. Associated symptoms include polydipsia and polyuria. Pertinent negatives for diabetes include no chest pain, no fatigue, no polyphagia and no weakness. There are no hypoglycemic complications. Symptoms are worsening. Diabetic complications include a CVA. Pertinent negatives for diabetic complications include no heart disease. (Awaiting stent placement in his carotid artery.) Risk factors for coronary artery disease include diabetes mellitus, dyslipidemia, hypertension, male sex and sedentary lifestyle. Current diabetic treatment includes insulin injections. His weight is increasing steadily. He is following a generally unhealthy diet. When asked about meal planning, he reported none. He has not had a previous visit with a dietitian. He rarely participates in exercise. His home blood glucose trend is increasing steadily. His breakfast blood glucose range is generally >200 mg/dl. His lunch blood glucose range is generally 180-200 mg/dl. His dinner blood glucose range is generally 180-200 mg/dl. His bedtime blood glucose range is generally >200 mg/dl. His overall blood glucose range is >200 mg/dl. (He presents with above target glycemic profile averaging 213 for the last 15 days.  His CGM analysis shows 31% of time in range, 69% above range, no hypoglycemia.  His point-of-care A1c is 9.6% unchanged from his last visit A1c.     ) An ACE inhibitor/angiotensin II receptor blocker is not being taken.  Hyperlipidemia This is a chronic problem. The current episode started more than 1 year ago. The problem is uncontrolled. Exacerbating diseases include diabetes. Pertinent negatives include no  chest pain, myalgias or shortness of breath. Current antihyperlipidemic treatment includes statins. Risk factors for coronary artery disease include diabetes mellitus, dyslipidemia, hypertension, male sex and a sedentary lifestyle.  Review of systems: Limited as above  Objective:    BP (!) 100/56   Pulse 64   Ht '5\' 10"'$  (1.778 m)   Wt 206 lb 6.4 oz (93.6 kg)   BMI 29.62 kg/m   Wt Readings from Last 3 Encounters:  04/14/21 206 lb 6.4 oz (93.6 kg)  04/06/21 202 lb (91.6 kg)  03/30/21 202 lb (91.6 kg)      CMP ( most recent) CMP     Component Value Date/Time   NA 138 02/22/2021 1518   K 3.6 02/22/2021 1518   CL 106 02/22/2021 1518   CO2 25 02/22/2021 1518   GLUCOSE 206 (H) 02/22/2021 1518   BUN 14 02/22/2021 1518   CREATININE 0.73 02/22/2021 1518   CREATININE 0.86 05/17/2020 1329   CALCIUM 8.5 (L) 02/22/2021 1518   PROT 6.8 02/22/2021 1518   ALBUMIN 3.9 02/22/2021 1518   AST 22 02/22/2021 1518   AST 16 12/11/2018 1018   ALT 23 02/22/2021 1518   ALT 19 12/11/2018 1018   ALKPHOS 69 02/22/2021 1518   BILITOT 0.9 02/22/2021 1518   BILITOT 0.8 12/11/2018 1018   GFRNONAA >60 02/22/2021 1518   GFRNONAA 91 05/17/2020 1329   GFRAA >60 05/18/2020 1041   GFRAA 105 05/17/2020 1329     Diabetic Labs (most recent): Lab Results  Component Value Date   HGBA1C 9.6 (A) 04/14/2021   HGBA1C 9.6 (A) 12/01/2020   HGBA1C 8.9 (H) 08/12/2020     Lipid Panel ( most recent) Lipid Panel     Component Value Date/Time   CHOL 111 05/17/2020 1329   TRIG 90 05/17/2020 1329   HDL 50 05/17/2020 1329   CHOLHDL 2.2 05/17/2020 1329   VLDL 15 01/06/2020 1008   LDLCALC 44 05/17/2020 1329    Assessment & Plan:   1. Uncontrolled type 2 diabetes mellitus with hyperglycemia ([place])  - Mark Foster has history of uncontrolled diabetes .  He presents with above target glycemic profile averaging 213 for the last 15 days.  His CGM analysis shows 31% of time in range, 69% above range, no  hypoglycemia.  His point-of-care A1c is 9.6% unchanged from his last visit A1c.        He reports that his fasting blood glucose readings are above 200 mg per DL.    -  Recent labs reviewed.  -his diabetes is likely pancreatic diabetes given his history of heavy alcohol use in the past.  Administratively  reclassified his diabetes as  type 1 for management purposes. -He is advised to introduce more carbohydrates to his diet.  He has benefited from the weight gain so far.   -He is not a suitable candidate for oral antidiabetic medications nor incretin therapy.    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 -He is advised to increase Lantus to 50 units nightly, increase NovoLog to 12 units 3 times daily AC for Premeal blood glucose readings between 90-150 mg per DL, and specific correction dose for readings above 150 mg per DL.  He agrees to continue to monitor blood glucose at least 4 times a day using his CGM device.   -Patient is encouraged to call clinic for blood glucose levels less than 70 or above 300 mg /dl.  - Patient specific target  A1c;  LDL, HDL, Triglycerides, were discussed in detail. -He is anti-GAD antibodies, anti-islet cell antibodies are negative indicating  his diabetes is not likely to be type 1,  likely related to his prior alcohol abuse causing pancreatic diabetes.   -The priority in this patient would be to avoid hypoglycemia due to the fact that he lacks  alpha cell  glucagon response on the background of pancreatectomy  and prior alcohol induced diabetes.  2) Lipids/HPL: His recent lipid panel showed controlled LDL at 44, improving from 123.  He is advised to continue Lipitor 40 mg p.o. nightly.  Side effects and precautions discussed with him.         3) hypertension/blood pressure:  His blood pressure is  controlled to target.  He is on metoprolol 100 mg p.o. daily.  4)  Weight/Diet: He continues to regain his weight back-gained adequate weight.  He is not a candidate for weight loss.  He is encouraged to continue to introduce complex carbohydrates.  Diet, exercise, and detailed carbohydrates information provided.  5) hypothyroidism -He is currently on levothyroxine 50 mcg p.o. daily.  He does not have recent thyroid function tests, thyroid function test from December were consistent with appropriate replacement.     - We discussed about the correct intake of his thyroid hormone, on empty stomach at fasting, with water, separated by at least 30 minutes from breakfast and other medications,  and separated by more than 4 hours from calcium, iron, multivitamins, acid reflux medications (PPIs). -Patient is made aware of the fact that thyroid hormone replacement is needed for life, dose to be adjusted by periodic monitoring of thyroid function tests.   6) Chronic Care/Health Maintenance:  -he  is on Statin medications and  is encouraged to continue to follow up with Ophthalmology, Dentist,  Podiatrist at least yearly or according to recommendations, and advised to  stay away from smoking. I have recommended yearly flu vaccine and pneumonia vaccination at least every 5 years;  and  sleep for at least 7 hours a day.  -His screening ABI was negative for PAD in December 2021.  This study will be repeated in December 2026, or sooner if needed. - I advised patient to maintain close follow up with Celene Squibb, MD for primary care needs.-   I spent 35 minutes in the care of the patient today including review of labs from [facility], Lipids, Thyroid Function, Hematology (current and previous including abstractions from other facilities); face-to-face time discussing  his blood glucose readings/logs, discussing hypoglycemia and hyperglycemia episodes and symptoms, medications doses, his options of short and long term  treatment based on the latest standards of care / guidelines;  discussion about incorporating lifestyle medicine;  and documenting the encounter.    Please refer to Patient Instructions for Blood Glucose Monitoring and Insulin/Medications Dosing Guide"  in media tab for additional information. Please  also refer to " Patient Self Inventory" in the Media  tab for reviewed elements of pertinent patient history.  Mark Foster participated in the discussions, expressed understanding, and voiced agreement with the above plans.  All questions were answered to his satisfaction. he is encouraged to contact clinic should he have any questions or concerns prior to his return visit.    Follow up plan: - Return in about 4 weeks (around 05/12/2021) for F/U with Meter and Logs Only - no Labs.  Glade Lloyd, MD [facility] [address] [place], [place] [address] Phone: [phone]  Fax: [phone]    04/14/2021, 12:52 PM  This note was partially dictated with voice recognition  software. Similar sounding words can be transcribed inadequately or may not  be corrected upon review.

## 2021-04-18 ENCOUNTER — Encounter: Payer: Self-pay | Admitting: Cardiology

## 2021-04-18 ENCOUNTER — Ambulatory Visit: Payer: HMO | Admitting: Cardiology

## 2021-04-18 ENCOUNTER — Other Ambulatory Visit: Payer: Self-pay

## 2021-04-18 VITALS — BP 116/60 | HR 67 | Ht 70.0 in | Wt 208.0 lb

## 2021-04-18 DIAGNOSIS — E782 Mixed hyperlipidemia: Secondary | ICD-10-CM

## 2021-04-18 DIAGNOSIS — I25119 Atherosclerotic heart disease of native coronary artery with unspecified angina pectoris: Secondary | ICD-10-CM

## 2021-04-18 DIAGNOSIS — Z01810 Encounter for preprocedural cardiovascular examination: Secondary | ICD-10-CM | POA: Diagnosis not present

## 2021-04-18 NOTE — Patient Instructions (Signed)

## 2021-04-18 NOTE — Progress Notes (Signed)
Cardiology Office Note  Date: 04/18/2021   ID: SOLOMONE TYRIE, DOB 02/12/1956, MRN [id number]  PCP:  Celene Squibb, MD  Cardiologist:  Rozann Lesches, MD Electrophysiologist:  None   Chief Complaint  Patient presents with   Cardiac follow-up    History of Present Illness: EMILIAN HINNENKAMP is a 65 y.o. male last seen in February.  He is here today with his wife for a follow-up visit.  Reports no regular angina symptoms or nitroglycerin use, only occasionally.  He states that he has been compliant with his medications.  I note that he is following with Dr. Lorin Mercy for management of cervical spinal stenosis with plan for him to undergo anterior cervical fusion, surgical date pending.  I personally reviewed his ECG today which shows normal sinus rhythm.  Current cardiac regimen is stable and outlined below.  He continues to follow with Dr. Nevada Crane for routine lab work.  Lipids have been well controlled on high-dose Lipitor.  He continues to follow with Dr. Delton Coombes, history of pancreatic cancer.  He has a Port-A-Cath in place, presently not in use.  Also prior history of left upper extremity DVT for which he remains on Eliquis.  Past Medical History:  Diagnosis Date   Bilateral carpal tunnel syndrome 10/19/2020   CAD (coronary artery disease)    a. 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   Type 2 diabetes mellitus ([place])     Past Surgical History:  Procedure Laterality Date   APPENDECTOMY     BUBBLE STUDY N/A 11/18/2019   Procedure: BUBBLE STUDY;  Surgeon: Herminio Commons, MD;  Location:  [facility];  Service: Cardiology;  Laterality: N/A;   CARDIAC SURGERY     COLON RESECTION     For diverticulitis   COLON SURGERY     CORONARY ARTERY BYPASS GRAFT N/A 01/26/2020   Procedure: CORONARY ARTERY BYPASS GRAFTING (CABG) TIMES FOUR USING LEFT INTERNAL MAMMARY VEIN AND RIGHT GREATER SAPHENOUS VEIN;  Surgeon: Melrose Nakayama, MD;  Location: [facility];  Service: Open Heart Surgery;  Laterality: N/A;   ENDOVEIN HARVEST OF GREATER SAPHENOUS VEIN Right 01/26/2020   Procedure: [place] Of Greater Saphenous Vein;  Surgeon: Melrose Nakayama, MD;  Location: [employer] [facility];  Service: Open Heart Surgery;  Laterality: Right;   ESOPHAGOGASTRODUODENOSCOPY N/A 10/03/2018   Procedure: ESOPHAGOGASTRODUODENOSCOPY (EGD);  Surgeon: [name], MD;  Location: [name];  Service: Endoscopy;  Laterality: N/A;   EUS N/A 10/03/2018   Procedure: UPPER ENDOSCOPIC ULTRASOUND (EUS) RADIAL;  Surgeon: [name], MD;  Location: [facility];  Service: Endoscopy;  Laterality: N/A;   EYE SURGERY Bilateral    FINE NEEDLE ASPIRATION N/A 10/03/2018   Procedure: FINE NEEDLE ASPIRATION (FNA) LINEAR;  Surgeon: [name], MD;  Location: [facility];  Service: Endoscopy;  Laterality: N/A;   IR ANGIO INTRA EXTRACRAN SEL COM CAROTID INNOMINATE BILAT MOD SED  01/21/2018   IR ANGIO INTRA EXTRACRAN SEL COM CAROTID INNOMINATE UNI R MOD SED  03/21/2018   IR  ANGIO VERTEBRAL SEL VERTEBRAL BILAT MOD SED  01/21/2018   IR TRANSCATH EXCRAN VERT OR CAR A STENT  03/21/2018   LEFT HEART CATH AND CORONARY ANGIOGRAPHY N/A 01/08/2020   Procedure: LEFT HEART CATH AND CORONARY ANGIOGRAPHY;  Surgeon: Leonie Man, MD;  Location: [facility];  Service: Cardiovascular;  Laterality: N/A;   PORTACATH PLACEMENT Left 12/23/2018   Procedure: INSERTION PORT-A-CATH;  Surgeon: Virl Cagey, MD;  Location: [facility];  Service: General;  Laterality: Left;   RADIOLOGY WITH ANESTHESIA N/A 03/21/2018   Procedure: IR WITH ANESTHESIA  WITH STENT PLACEMENT;  Surgeon: Luanne Bras, MD;  Location: [facility];  Service: Radiology;  Laterality: N/A;   ROTATOR CUFF REPAIR     Left   SPLENECTOMY     TEE WITHOUT CARDIOVERSION N/A 11/18/2019   Procedure: TRANSESOPHAGEAL ECHOCARDIOGRAM (TEE) WITH PROPOFOL;  Surgeon: Herminio Commons, MD;  Location: [facility];  Service: Cardiology;  Laterality: N/A;   TEE WITHOUT CARDIOVERSION N/A 01/26/2020   Procedure: TRANSESOPHAGEAL ECHOCARDIOGRAM (TEE);  Surgeon: Melrose Nakayama, MD;  Location: [facility];  Service: Open Heart Surgery;  Laterality: N/A;   TONSILLECTOMY      Current Outpatient Medications  Medication Sig Dispense Refill   apixaban (ELIQUIS) 5 MG TABS tablet Take 1 tablet (5 mg total) by mouth 2 (two) times daily. 180 tablet 1   aspirin EC 81 MG EC tablet Take 1 tablet (81 mg total) by mouth daily.     atorvastatin (LIPITOR) 80 MG tablet Take 1 tablet (80 mg total) by mouth daily at 6 PM. 90 tablet 1   Continuous Blood Gluc Receiver (FREESTYLE LIBRE 2 READER) DEVI As directed 1 each 0   Continuous Blood Gluc Sensor (FREESTYLE LIBRE 2 SENSOR) MISC USE AS DIRECTED TO CHECK BLOOD SUGAR, REPLACE [id number] DAYS 2 each 2   gabapentin (NEURONTIN) 300 MG capsule Take 1 capsule (300 mg total) by mouth 3 (three) times daily. 270 capsule 3   glucose blood test strip 1 each by Other route 4 (four) times daily. Use as instructed 100 each 2   insulin aspart (NOVOLOG FLEXPEN) 100 UNIT/ML FlexPen Inject 12-15 Units into the skin 3 (three) times daily before meals. 30 mL 1   insulin glargine (LANTUS SOLOSTAR) 100 UNIT/ML Solostar Pen Inject 50 Units into the skin at bedtime. 30 mL 1   Insulin Pen Needle 32G X 4 MM MISC 1 Device by Does not apply route daily. Use as directed to inject insulin four times daily 200 each 2   levothyroxine (SYNTHROID) 50 MCG tablet TAKE 1 TABLET EVERY DAY 90 tablet 1   lidocaine-prilocaine (EMLA) cream Apply 1 application topically daily as needed (prior to port being  accessed (~every 3 months)).     nitroGLYCERIN (NITROSTAT) 0.4 MG SL tablet Place 1 tablet (0.4 mg total) under the tongue every 5 (five) minutes as needed for chest pain. 100 tablet 3   Omega-3 1000 MG CAPS Take 1,000 mg by mouth daily.     ondansetron (ZOFRAN) 4 MG tablet Take by mouth.     sertraline (ZOLOFT) 50 MG tablet TAKE 1 TABLET BY MOUTH DAILY (Patient taking differently: Take 50 mg by mouth daily.) 90 tablet 1   tamsulosin (FLOMAX) 0.4 MG CAPS capsule Take 1 capsule (0.4 mg total) by mouth daily. 90 capsule 3   traMADol (ULTRAM) 50 MG tablet Take 50 mg by mouth 4 (four) times daily as needed.     vitamin B-12 (CYANOCOBALAMIN) 1000 MCG tablet Take 1,000  mcg by mouth daily.     metoprolol tartrate (LOPRESSOR) 25 MG tablet Take 1 tablet (25 mg total) by mouth 2 (two) times daily. 180 tablet 3   No current facility-administered medications for this visit.   Facility-Administered Medications Ordered in Other Visits  Medication Dose Route Frequency Provider Last Rate Last Admin   0.9 %  sodium chloride infusion   Intravenous Continuous Derek Jack, MD 20 mL/hr at 12/30/18 1351 New Bag at 12/30/18 1351   0.9 %  sodium chloride infusion   Intravenous Continuous Derek Jack, MD 20 mL/hr at 12/30/18 1401 New Bag at 12/30/18 1401   dextrose 5 % solution   Intravenous Once Derek Jack, MD       heparin lock flush 100 unit/mL  500 Units Intracatheter Once PRN Derek Jack, MD       sodium chloride flush (NS) 0.9 % injection 10 mL  10 mL Intracatheter PRN Derek Jack, MD   10 mL at 12/30/18 0911   sodium chloride flush (NS) 0.9 % injection 10 mL  10 mL Intracatheter PRN Derek Jack, MD   10 mL at 05/21/19 0845   Allergies:  Demerol [meperidine hcl]   ROS: No palpitations or syncope.  Physical Exam: VS:  BP 116/60   Pulse 67   Ht '5\' 10"'$  (1.778 m)   Wt 208 lb (94.3 kg)   SpO2 97%   BMI 29.84 kg/m , BMI Body mass index is 29.84  kg/m.  Wt Readings from Last 3 Encounters:  04/18/21 208 lb (94.3 kg)  04/14/21 206 lb 6.4 oz (93.6 kg)  04/06/21 202 lb (91.6 kg)    General: Patient appears comfortable at rest. HEENT: Conjunctiva and lids normal, wearing a mask. Neck: Supple, no elevated JVP. Lungs: Clear to auscultation, nonlabored breathing at rest. Cardiac: Regular rate and rhythm, no S3 or significant systolic murmur, no pericardial rub. Extremities: No pitting edema.  ECG:  An ECG dated 05/18/2020 was personally reviewed today and demonstrated:  Sinus rhythm.  Recent Labwork: 08/12/2020: TSH 1.661 02/22/2021: ALT 23; AST 22; BUN 14; Creatinine, Ser 0.73; Hemoglobin 13.9; Platelets 183; Potassium 3.6; Sodium 138     Component Value Date/Time   CHOL 111 05/17/2020 1329   TRIG 90 05/17/2020 1329   HDL 50 05/17/2020 1329   CHOLHDL 2.2 05/17/2020 1329   VLDL 15 01/06/2020 1008   LDLCALC 44 05/17/2020 1329    Other Studies Reviewed Today:  TEE 11/18/2019:  1. Left ventricular ejection fraction, by estimation, is 60 to 65%. The  left ventricle has normal function. The left ventricle has no regional  wall motion abnormalities.   2. Right ventricular systolic function is normal. The right ventricular  size is normal.   3. No left atrial/left atrial appendage thrombus was detected.   4. The mitral valve is grossly normal. No evidence of mitral valve  regurgitation.   5. The aortic valve is tricuspid. Aortic valve regurgitation is not  visualized. No aortic stenosis is present.   6. Agitated saline contrast bubble study was negative, with no evidence  of any interatrial shunt.    Carotid Dopplers and ABIs 01/22/2020: Summary:  Right Carotid: Velocities in the right ICA are consistent with a 1-39%  stenosis.   Left Carotid: Evidence consistent with a total occlusion of the left ICA.  Vertebrals: Bilateral vertebral arteries demonstrate antegrade flow.   Right ABI: Resting right ankle-brachial index is  within normal range. No  evidence of significant right lower extremity arterial disease.  Left ABI: Resting left ankle-brachial index indicates mild left lower  extremity arterial disease.  Right Upper Extremity: Doppler waveform obliterate with right radial  compression. Doppler waveform obliterate with right ulnar compression.  Left Upper Extremity: Doppler waveform obliterate with left radial  compression. Doppler waveforms remain within normal limits with left ulnar  compression.   Assessment and Plan:  1.  Preoperative cardiac evaluation in a 65 year old male with history of multivessel CAD status post CABG in May 2021, hyperlipidemia, previous pancreatic cancer followed by oncology, and left upper extremity DVT history with Port-A-Cath in place and on Eliquis.  He is being considered for anterior cervical fusion under general anesthesia.  RCRI cardiac risk calculator is at class IV, 11% chance of major adverse cardiac event.  Although technically in higher range, presently no modifiable risk with stable cardiac symptoms and he should be able to proceed with surgery on medical therapy.  Eliquis would need to be held at least 48 hours prior presuming this is agreed to by Dr. Delton Coombes.  2.  Multivessel CAD status post CABG in May 2021.  He reports stable, occasional angina symptoms.  Continue aspirin, Lipitor, Lopressor, and as needed nitroglycerin.  ECG today is normal.  3.  Mixed hyperlipidemia, tolerating high-dose Lipitor with good LDL control.  4.  Pancreatic cancer, follows with Dr. Delton Coombes.  5.  History of left upper extremity DVT with Port-A-Cath in place, on Eliquis per Dr. Delton Coombes.  Medication Adjustments/Labs and Tests Ordered: Current medicines are reviewed at length with the patient today.  Concerns regarding medicines are outlined above.   Tests Ordered: Orders Placed This Encounter  Procedures   EKG 12-Lead    Medication Changes: No orders of the defined  types were placed in this encounter.   Disposition:  Follow up  6 months.  Signed, Satira Sark, MD, [facility] 04/18/2021 1:27 PM    [facility] at [facility] [address], [place], [place] [address] Phone: [phone]; Fax: [phone]

## 2021-04-21 ENCOUNTER — Telehealth: Payer: Self-pay

## 2021-04-21 NOTE — Telephone Encounter (Signed)
Received surgical clearance form from [place] [facility] [place] for Cervical Fusion.  Dr. Jannifer Franklin has provided clearance for this and form has been faxed to [phone]. Confirmation received.

## 2021-04-27 ENCOUNTER — Telehealth: Payer: Self-pay

## 2021-04-27 IMAGING — DX DG CHEST 1V PORT
1 series · 1 of 1 positions shown · non-contrast
Comparison: Chest x-ray dated January 22, 2020.

CLINICAL DATA: Status post CABG.

EXAM:
PORTABLE CHEST 1 VIEW

[chest ap]
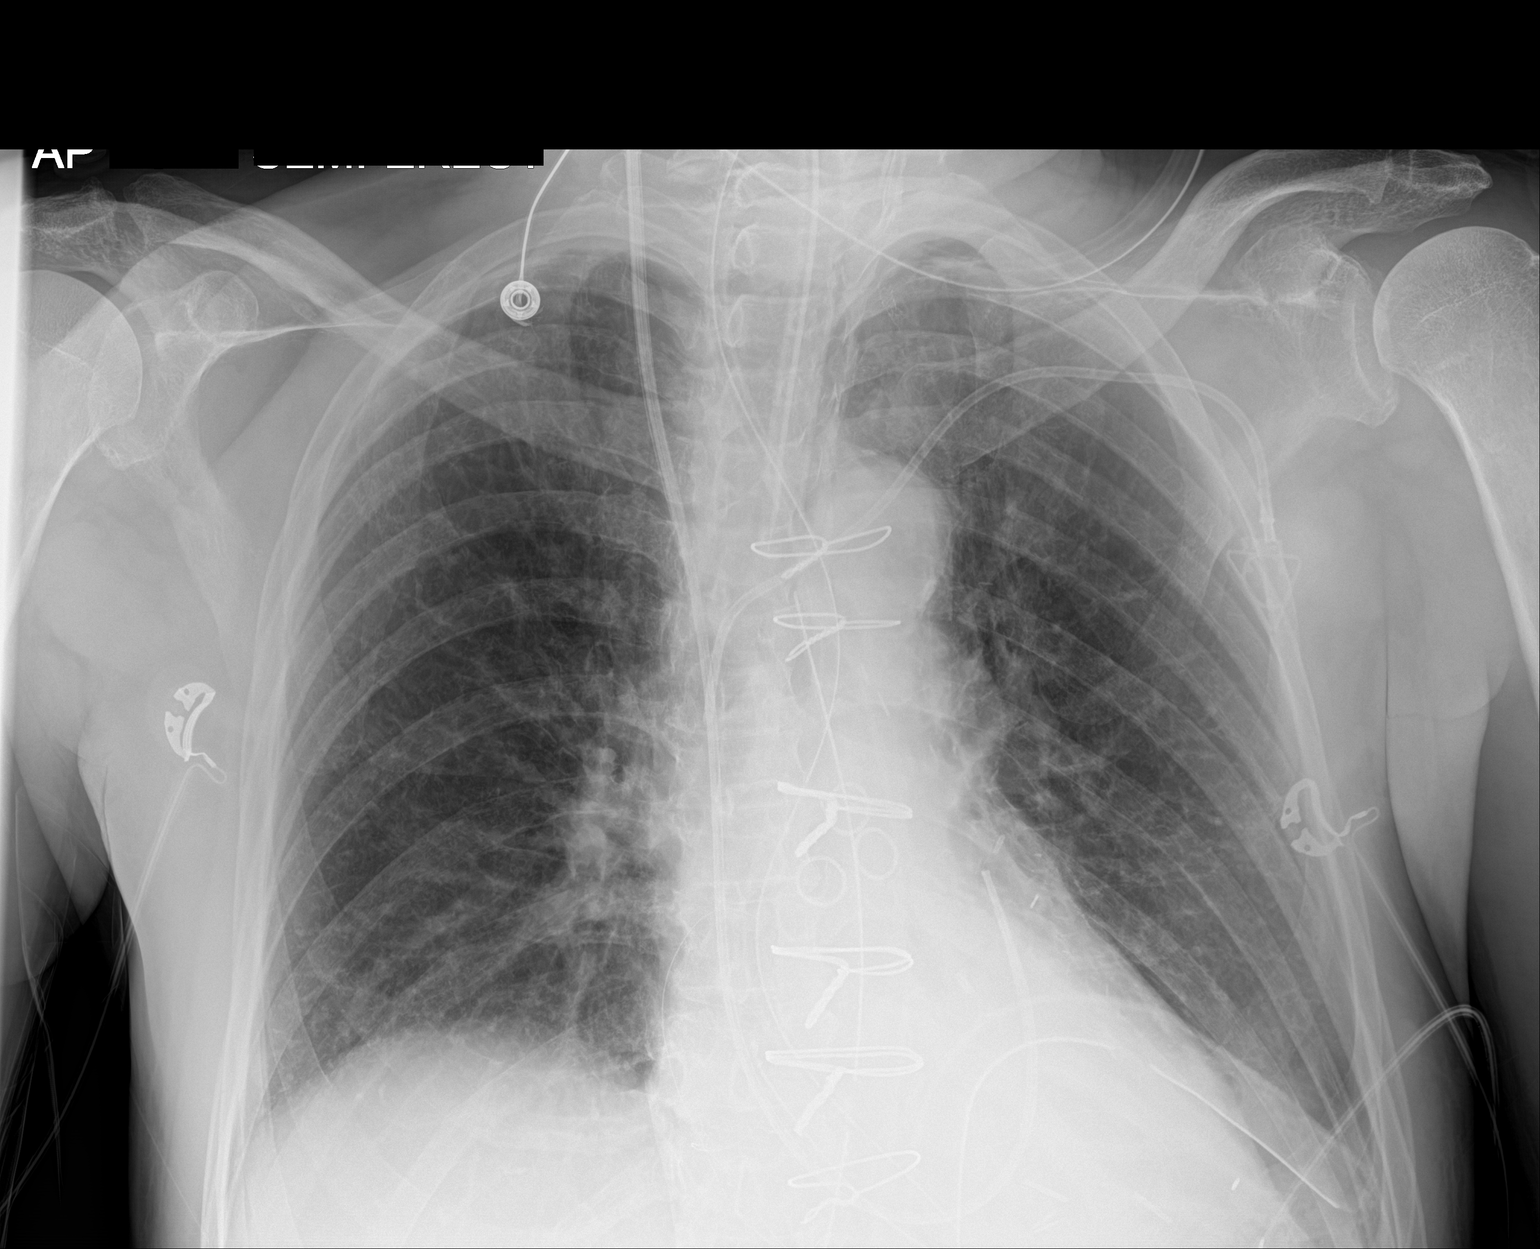

[1 of 1 positions shown; findings below may reference images not displayed]

FINDINGS: Endotracheal tube in position with the tip 2.8 cm above the level of
the carina. Enteric tube tip just inside the stomach with the distal
side port in the lower esophagus. Right internal jugular Swan-Ganz
catheter with the tip in the main pulmonary outflow tract.
Mediastinal and left chest tubes are in good position. Unchanged
left chest wall port catheter.

Interval CABG. Normal heart size. Normal pulmonary vascularity. Left
basilar atelectasis. No significant pleural effusion. No
pneumothorax. No acute osseous abnormality.
IMPRESSION: 1. Lines and tubes as above. Enteric tube tip is just inside the
stomach with the distal side port in the lower esophagus. Recommend
advancement.
2. Interval CABG.  Left basilar atelectasis.

## 2021-04-27 NOTE — Telephone Encounter (Signed)
I called patient to return his voice mail requesting a return call.  Left patient a voice mail to call me back.  Surgery is still pending PCP clearance.  Neurology clearance has been received.

## 2021-04-30 IMAGING — DX DG CHEST 2V
2 series · 2 of 2 positions shown · non-contrast
Comparison: 01/28/2019

CLINICAL DATA: Follow-up CABG 2 days ago.

EXAM:
CHEST - 2 VIEW

[chest lat]
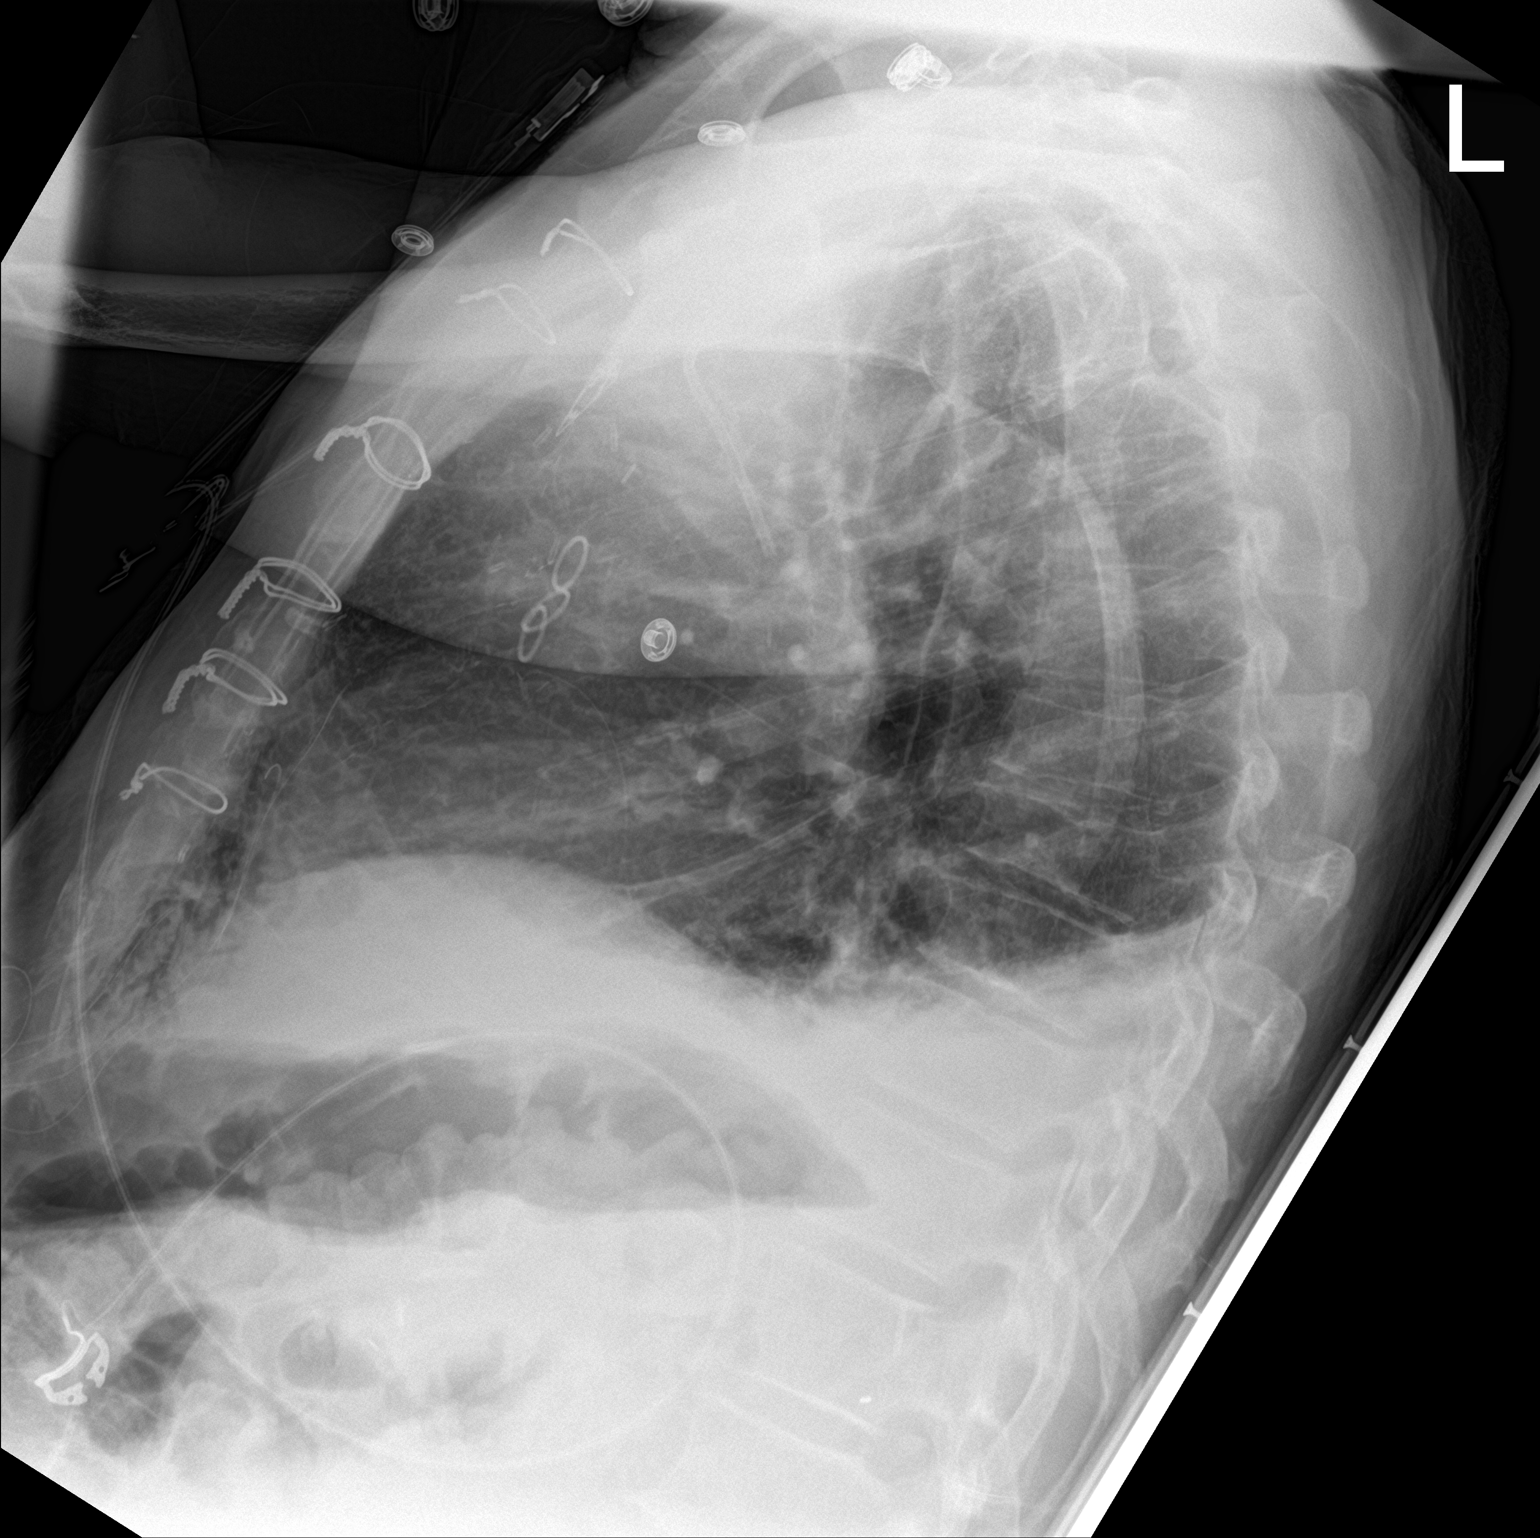

[chest ap]
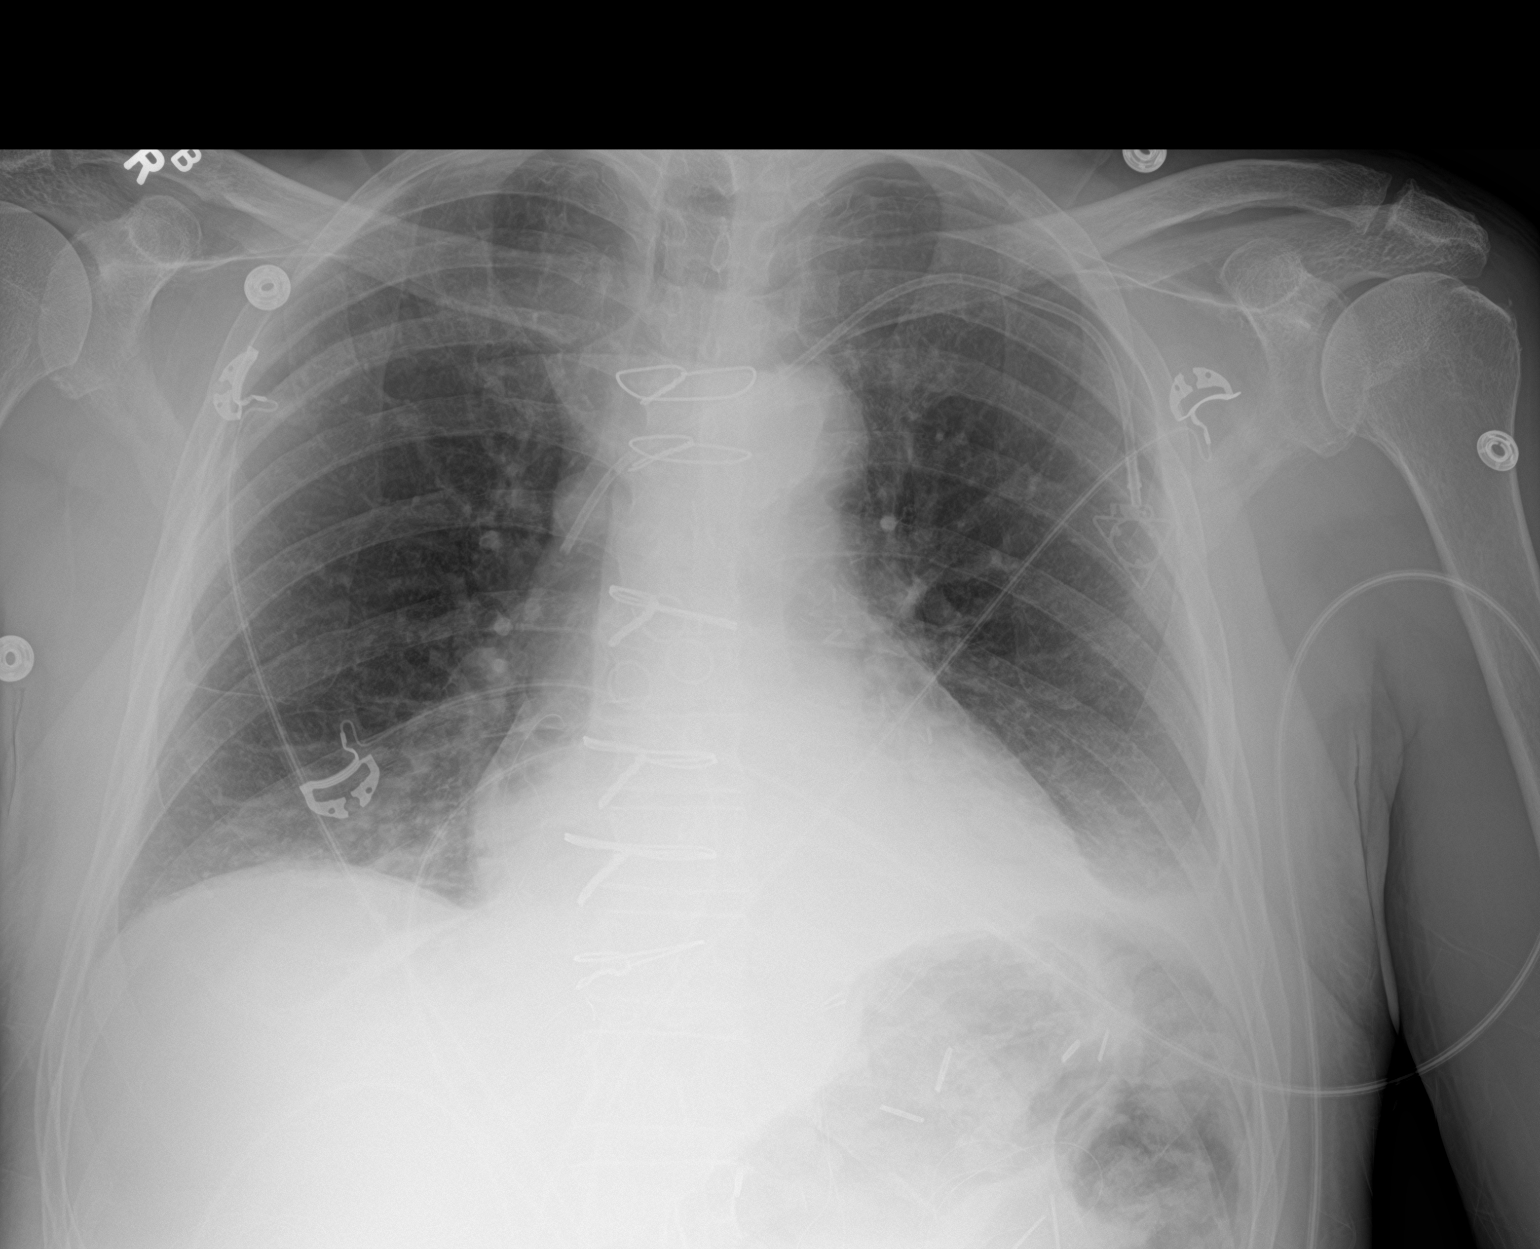

[2 of 2 positions shown; findings below may reference images not displayed]

FINDINGS: Left subclavian Port-A-Cath has tip over the SVC unchanged. Lungs
are somewhat hypoinflated with persistent mild bibasilar
opacification likely small left effusion with associated bibasilar
atelectasis. Infection in the lung bases is possible. Tiny
persistent left apical pneumothorax unchanged. Cardiomediastinal
silhouette and remainder of the exam is unchanged.
IMPRESSION: 1. Persistent bibasilar opacification compatible with small left
effusion with bibasilar atelectasis. Infection in the lung bases is
possible.

2.  Stable persistent tiny left apical pneumothorax.

## 2021-05-02 ENCOUNTER — Other Ambulatory Visit: Payer: Self-pay | Admitting: "Endocrinology

## 2021-05-04 DIAGNOSIS — M4802 Spinal stenosis, cervical region: Secondary | ICD-10-CM | POA: Diagnosis not present

## 2021-05-04 DIAGNOSIS — I2510 Atherosclerotic heart disease of native coronary artery without angina pectoris: Secondary | ICD-10-CM | POA: Diagnosis not present

## 2021-05-04 DIAGNOSIS — Z7901 Long term (current) use of anticoagulants: Secondary | ICD-10-CM | POA: Diagnosis not present

## 2021-05-04 DIAGNOSIS — K219 Gastro-esophageal reflux disease without esophagitis: Secondary | ICD-10-CM | POA: Diagnosis not present

## 2021-05-04 DIAGNOSIS — E782 Mixed hyperlipidemia: Secondary | ICD-10-CM | POA: Diagnosis not present

## 2021-05-04 DIAGNOSIS — G40309 Generalized idiopathic epilepsy and epileptic syndromes, not intractable, without status epilepticus: Secondary | ICD-10-CM | POA: Diagnosis not present

## 2021-05-04 DIAGNOSIS — M545 Low back pain, unspecified: Secondary | ICD-10-CM | POA: Diagnosis not present

## 2021-05-04 DIAGNOSIS — E039 Hypothyroidism, unspecified: Secondary | ICD-10-CM | POA: Diagnosis not present

## 2021-05-04 DIAGNOSIS — Z01818 Encounter for other preprocedural examination: Secondary | ICD-10-CM | POA: Diagnosis not present

## 2021-05-04 DIAGNOSIS — I10 Essential (primary) hypertension: Secondary | ICD-10-CM | POA: Diagnosis not present

## 2021-05-04 DIAGNOSIS — N400 Enlarged prostate without lower urinary tract symptoms: Secondary | ICD-10-CM | POA: Diagnosis not present

## 2021-05-04 DIAGNOSIS — E119 Type 2 diabetes mellitus without complications: Secondary | ICD-10-CM | POA: Diagnosis not present

## 2021-05-12 ENCOUNTER — Ambulatory Visit: Payer: HMO | Admitting: "Endocrinology

## 2021-05-12 ENCOUNTER — Encounter: Payer: Self-pay | Admitting: "Endocrinology

## 2021-05-12 ENCOUNTER — Other Ambulatory Visit: Payer: Self-pay

## 2021-05-12 VITALS — BP 116/67 | HR 76 | Ht 70.0 in | Wt 207.4 lb

## 2021-05-12 DIAGNOSIS — E1165 Type 2 diabetes mellitus with hyperglycemia: Secondary | ICD-10-CM

## 2021-05-12 DIAGNOSIS — I10 Essential (primary) hypertension: Secondary | ICD-10-CM | POA: Diagnosis not present

## 2021-05-12 DIAGNOSIS — E782 Mixed hyperlipidemia: Secondary | ICD-10-CM | POA: Diagnosis not present

## 2021-05-12 DIAGNOSIS — E039 Hypothyroidism, unspecified: Secondary | ICD-10-CM | POA: Diagnosis not present

## 2021-05-12 MED ORDER — TOUJEO MAX SOLOSTAR 300 UNIT/ML ~~LOC~~ SOPN: 60.0000 [IU] | PEN_INJECTOR | Freq: Every day | SUBCUTANEOUS | 2 refills | Status: DC

## 2021-05-12 MED ORDER — NOVOLOG FLEXPEN 100 UNIT/ML ~~LOC~~ SOPN: 14.0000 [IU] | PEN_INJECTOR | Freq: Three times a day (TID) | SUBCUTANEOUS | 1 refills | Status: AC

## 2021-05-12 NOTE — Progress Notes (Signed)
05/12/2021, 11:18 AM       Endocrinology follow-up note   Subjective:    Patient ID: Mark Foster, male    DOB: July 04, 1956.  Mark Foster is being seen in follow-up for  management of currently uncontrolled symptomatic pancreatic diabetes, hypothyroidism, hyperlipidemia. PMD:  Celene Squibb, MD.   Past Medical History:  Diagnosis Date   Bilateral carpal tunnel syndrome 10/19/2020   CAD (coronary artery disease)    a. 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   Type 2 diabetes mellitus ([place])    Past Surgical History:  Procedure Laterality Date   APPENDECTOMY     BUBBLE STUDY N/A 11/18/2019   Procedure: BUBBLE STUDY;  Surgeon: Herminio Commons, MD;  Location: [facility];  Service: Cardiology;  Laterality: N/A;   CARDIAC SURGERY     COLON RESECTION     For diverticulitis   COLON SURGERY     CORONARY ARTERY BYPASS GRAFT N/A 01/26/2020   Procedure: CORONARY ARTERY BYPASS GRAFTING (CABG) TIMES FOUR USING LEFT INTERNAL MAMMARY VEIN AND RIGHT GREATER SAPHENOUS VEIN;  Surgeon: Melrose Nakayama, MD;  Location: [place];  Service: Open Heart Surgery;  Laterality: N/A;   ENDOVEIN HARVEST OF GREATER SAPHENOUS VEIN Right 01/26/2020   Procedure: [place] Of Greater Saphenous Vein;  Surgeon: Melrose Nakayama, MD;  Location: [place];  Service: Open Heart Surgery;  Laterality: Right;   ESOPHAGOGASTRODUODENOSCOPY N/A 10/03/2018   Procedure: ESOPHAGOGASTRODUODENOSCOPY (EGD);  Surgeon: [name], MD;   Location: [name];  Service: Endoscopy;  Laterality: N/A;   EUS N/A 10/03/2018   Procedure: UPPER ENDOSCOPIC ULTRASOUND (EUS) RADIAL;  Surgeon: [name], MD;  Location: [facility];  Service: Endoscopy;  Laterality: N/A;   EYE SURGERY Bilateral    FINE NEEDLE ASPIRATION N/A 10/03/2018   Procedure: FINE NEEDLE ASPIRATION (FNA) LINEAR;  Surgeon: [name], MD;  Location: [facility];  Service: Endoscopy;  Laterality: N/A;   IR ANGIO INTRA EXTRACRAN SEL COM CAROTID INNOMINATE BILAT MOD SED  01/21/2018   IR ANGIO INTRA EXTRACRAN SEL COM CAROTID INNOMINATE UNI R MOD SED  03/21/2018   IR ANGIO VERTEBRAL SEL VERTEBRAL BILAT MOD SED  01/21/2018   IR TRANSCATH EXCRAN VERT OR CAR A STENT  03/21/2018   LEFT HEART CATH AND CORONARY ANGIOGRAPHY N/A 01/08/2020   Procedure: LEFT HEART CATH AND CORONARY ANGIOGRAPHY;  Surgeon: Leonie Man, MD;  Location: [facility];  Service: Cardiovascular;  Laterality: N/A;   PORTACATH PLACEMENT Left 12/23/2018   Procedure: INSERTION PORT-A-CATH;  Surgeon: Virl Cagey, MD;  Location: [facility];  Service: General;  Laterality: Left;   RADIOLOGY  WITH ANESTHESIA N/A 03/21/2018   Procedure: IR WITH ANESTHESIA WITH STENT PLACEMENT;  Surgeon: Luanne Bras, MD;  Location: [facility];  Service: Radiology;  Laterality: N/A;   ROTATOR CUFF REPAIR     Left   SPLENECTOMY     TEE WITHOUT CARDIOVERSION N/A 11/18/2019   Procedure: TRANSESOPHAGEAL ECHOCARDIOGRAM (TEE) WITH PROPOFOL;  Surgeon: Herminio Commons, MD;  Location: [facility];  Service: Cardiology;  Laterality: N/A;   TEE WITHOUT CARDIOVERSION N/A 01/26/2020   Procedure: TRANSESOPHAGEAL ECHOCARDIOGRAM (TEE);  Surgeon: Melrose Nakayama, MD;  Location: [place];  Service: Open Heart Surgery;  Laterality: N/A;   TONSILLECTOMY     Social History   Socioeconomic History   Marital status: Married    Spouse name: Not on file   Number of children: 2   Years of education: Not on file   Highest  education level: Not on file  Occupational History   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  Tobacco Use   Smoking status: Former    Types: Cigarettes    Start date: 10/06/2019   Smokeless tobacco: Never  Vaping Use   Vaping Use: Never used  Substance and Sexual Activity   Alcohol use: Yes    Alcohol/week: 1.0 standard drink    Types: 1 Cans of beer per week    Comment: rare    Drug use: Never   Sexual activity: Not on file  Other Topics Concern   Not on file  Social History Narrative   Not on file   Social Determinants of Health   Financial Resource Strain: Not on file  Food Insecurity: Not on file  Transportation Needs: Not on file  Physical Activity: Not on file  Stress: Not on file  Social Connections: Not on file   Outpatient Encounter Medications as of 05/12/2021  Medication Sig   insulin glargine, 2 Unit Dial, (TOUJEO MAX SOLOSTAR) 300 UNIT/ML Solostar Pen Inject 60 Units into the skin at bedtime.   apixaban (ELIQUIS) 5 MG TABS tablet Take 1 tablet (5 mg total) by mouth 2 (two) times daily.   aspirin EC 81 MG EC tablet Take 1 tablet (81 mg total) by mouth daily.   atorvastatin (LIPITOR) 80 MG tablet Take 1 tablet (80 mg total) by mouth daily at 6 PM.   Continuous Blood Gluc Receiver (FREESTYLE LIBRE 2 READER) DEVI As directed   Continuous Blood Gluc Sensor (FREESTYLE LIBRE 2 SENSOR) MISC USE AS DIRECTED TO CHECK BLOOD SUGAR, REPLACE [id number] DAYS   FREESTYLE PRECISION NEO TEST test strip USE TO TEST BLOOD SUGAR 4 TIMES DAILY.   gabapentin (NEURONTIN) 300 MG capsule Take 1 capsule (300 mg total) by mouth 3 (three) times daily.   insulin aspart (NOVOLOG FLEXPEN) 100 UNIT/ML FlexPen Inject 14-20 Units into the skin 3 (three) times daily before meals.   Insulin Pen Needle 32G X 4 MM MISC 1 Device by Does not apply route daily. Use as directed to inject insulin four times daily   levothyroxine (SYNTHROID) 50 MCG tablet TAKE 1 TABLET EVERY DAY   lidocaine-prilocaine (EMLA) cream Apply 1  application topically daily as needed (prior to port being accessed (~every 3 months)).   metoprolol tartrate (LOPRESSOR) 25 MG tablet Take 1 tablet (25 mg total) by mouth 2 (two) times daily.   nitroGLYCERIN (NITROSTAT) 0.4 MG SL tablet Place 1 tablet (0.4 mg total) under the tongue every 5 (five) minutes as needed for chest pain.   Omega-3 1000 MG CAPS Take 1,000 mg by mouth daily.  ondansetron (ZOFRAN) 4 MG tablet Take by mouth.   sertraline (ZOLOFT) 50 MG tablet TAKE 1 TABLET BY MOUTH DAILY (Patient taking differently: Take 50 mg by mouth daily.)   tamsulosin (FLOMAX) 0.4 MG CAPS capsule Take 1 capsule (0.4 mg total) by mouth daily.   traMADol (ULTRAM) 50 MG tablet Take 50 mg by mouth 4 (four) times daily as needed.   vitamin B-12 (CYANOCOBALAMIN) 1000 MCG tablet Take 1,000 mcg by mouth daily.   [DISCONTINUED] insulin aspart (NOVOLOG FLEXPEN) 100 UNIT/ML FlexPen Inject 12-15 Units into the skin 3 (three) times daily before meals.   [DISCONTINUED] insulin glargine (LANTUS SOLOSTAR) 100 UNIT/ML Solostar Pen Inject 50 Units into the skin at bedtime.   Facility-Administered Encounter Medications as of 05/12/2021  Medication   0.9 %  sodium chloride infusion   0.9 %  sodium chloride infusion   dextrose 5 % solution   heparin lock flush 100 unit/mL   sodium chloride flush (NS) 0.9 % injection 10 mL   sodium chloride flush (NS) 0.9 % injection 10 mL    ALLERGIES: Allergies  Allergen Reactions   Demerol [Meperidine Hcl] Other (See Comments)    convulsions    VACCINATION STATUS: Immunization History  Administered Date(s) Administered   Fluad Quad(high Dose 65+) 05/18/2020   HiB (PRP-T) 10/23/2018   Influenza,inj,Quad PF,6+ Mos 06/19/2019   Influenza-Unspecified 08/02/2018   Meningococcal Mcv4o 10/23/2018   Pneumococcal Conjugate-13 10/23/2018    Diabetes He presents for his follow-up diabetic visit. He has type 1 diabetes mellitus. Onset time: He was diagnosed at approximate age of  78 years. His disease course has been improving (He did have A1c of 9.7% in May 2019.  However, more recently he has documented controlled glycemic profile averaging 134 over the last 2 weeks.). There are no hypoglycemic associated symptoms. Pertinent negatives for hypoglycemia include no confusion, headaches, pallor or seizures. Associated symptoms include polydipsia and polyuria. Pertinent negatives for diabetes include no chest pain, no fatigue, no polyphagia and no weakness. There are no hypoglycemic complications. Symptoms are improving. Diabetic complications include a CVA. Pertinent negatives for diabetic complications include no heart disease. (Awaiting stent placement in his carotid artery.) Risk factors for coronary artery disease include diabetes mellitus, dyslipidemia, hypertension, male sex and sedentary lifestyle. Current diabetic treatment includes insulin injections. His weight is increasing steadily. He is following a generally unhealthy diet. When asked about meal planning, he reported none. He has not had a previous visit with a dietitian. He rarely participates in exercise. His home blood glucose trend is decreasing steadily. His breakfast blood glucose range is generally 180-200 mg/dl. His lunch blood glucose range is generally >200 mg/dl. His dinner blood glucose range is generally >200 mg/dl. His bedtime blood glucose range is generally >200 mg/dl. His overall blood glucose range is >200 mg/dl. Mark Foster presents with slight improvement in his glycemic profile.  He has 33% time range, 67% above range.  No hypoglycemia.  His average blood glucose is 216.  His recent A1c was high at 9.6%.   ) An ACE inhibitor/angiotensin II receptor blocker is not being taken.  Hyperlipidemia This is a chronic problem. The current episode started more than 1 year ago. The problem is uncontrolled. Exacerbating diseases include diabetes. Pertinent negatives include no chest pain, myalgias or shortness of breath.  Current antihyperlipidemic treatment includes statins. Risk factors for coronary artery disease include diabetes mellitus, dyslipidemia, hypertension, male sex and a sedentary lifestyle.  Review of systems: Limited as above   Objective:  BP 116/67   Pulse 76   Ht '5\' 10"'$  (1.778 m)   Wt 207 lb 6.4 oz (94.1 kg)   BMI 29.76 kg/m   Wt Readings from Last 3 Encounters:  05/12/21 207 lb 6.4 oz (94.1 kg)  04/18/21 208 lb (94.3 kg)  04/14/21 206 lb 6.4 oz (93.6 kg)      CMP ( most recent) CMP     Component Value Date/Time   NA 138 02/22/2021 1518   K 3.6 02/22/2021 1518   CL 106 02/22/2021 1518   CO2 25 02/22/2021 1518   GLUCOSE 206 (H) 02/22/2021 1518   BUN 14 02/22/2021 1518   CREATININE 0.73 02/22/2021 1518   CREATININE 0.86 05/17/2020 1329   CALCIUM 8.5 (L) 02/22/2021 1518   PROT 6.8 02/22/2021 1518   ALBUMIN 3.9 02/22/2021 1518   AST 22 02/22/2021 1518   AST 16 12/11/2018 1018   ALT 23 02/22/2021 1518   ALT 19 12/11/2018 1018   ALKPHOS 69 02/22/2021 1518   BILITOT 0.9 02/22/2021 1518   BILITOT 0.8 12/11/2018 1018   GFRNONAA >60 02/22/2021 1518   GFRNONAA 91 05/17/2020 1329   GFRAA >60 05/18/2020 1041   GFRAA 105 05/17/2020 1329     Diabetic Labs (most recent): Lab Results  Component Value Date   HGBA1C 9.6 (A) 04/14/2021   HGBA1C 9.6 (A) 12/01/2020   HGBA1C 8.9 (H) 08/12/2020     Lipid Panel ( most recent) Lipid Panel     Component Value Date/Time   CHOL 111 05/17/2020 1329   TRIG 90 05/17/2020 1329   HDL 50 05/17/2020 1329   CHOLHDL 2.2 05/17/2020 1329   VLDL 15 01/06/2020 1008   LDLCALC 44 05/17/2020 1329    Assessment & Plan:   1. Uncontrolled type 2 diabetes mellitus with hyperglycemia ([place])  - Mark Foster has history of uncontrolled diabetes .  Mark Foster presents with slight improvement in his glycemic profile.  He has 33% time range, 67% above range.  No hypoglycemia.  His average blood glucose is 216.  His recent A1c was high at 9.6%.        He reports that his fasting blood glucose readings are above 200 mg per DL.    -  Recent labs reviewed.  -his diabetes is likely pancreatic diabetes given his history of heavy alcohol use in the past.  Administratively  reclassified his diabetes as  type 1 for management purposes. -He is advised to introduce more carbohydrates to his diet.  He has benefited from the weight gain so far.   -He is not a suitable candidate for oral antidiabetic medications nor incretin therapy.    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 -He is advised to increase Lantus to 60 units nightly and switched to Toujeo 60 units nightly, increase NovoLog to 14   units 3 times daily AC for Premeal blood glucose readings between 90-150 mg per DL, and specific correction dose for readings above 150 mg per DL.  He agrees to continue to monitor blood glucose at least 4 times a day using his CGM device.   -Patient is encouraged to call clinic for blood glucose levels less than 70 or above 200 mg /dl.  - Patient specific target  A1c;  LDL, HDL, Triglycerides, were discussed in detail. -He is anti-GAD antibodies, anti-islet cell antibodies are negative indicating  his diabetes is not likely to be type 1,  likely related to his prior alcohol abuse causing pancreatic diabetes.   -The priority in this patient would be to avoid hypoglycemia due to the fact that he lacks  alpha cell  glucagon response on the background of pancreatectomy  and prior alcohol induced diabetes.  2) Lipids/HPL: His recent lipid panel showed controlled LDL at 44, improving from 123.  He is advised to continue Lipitor 40 mg p.o. nightly.  Side effects and precautions discussed with him.         3) hypertension/blood pressure:  His blood pressure is controlled to target.  He is on metoprolol  100 mg p.o. daily.  4)  Weight/Diet: He continues to regain his weight back-gained adequate weight.  He is not a candidate for weight loss.  He is encouraged to continue to introduce complex carbohydrates.  Diet, exercise, and detailed carbohydrates information provided.  5) hypothyroidism -He is currently on levothyroxine 50 mcg p.o. daily.  He does not have recent thyroid function tests, thyroid function test from December were consistent with appropriate replacement.    - We discussed about the correct intake of his thyroid hormone, on empty stomach at fasting, with water, separated by at least 30 minutes from breakfast and other medications,  and separated by more than 4 hours from calcium, iron, multivitamins, acid reflux medications (PPIs). -Patient is made aware of the fact that thyroid hormone replacement is needed for life, dose to be adjusted by periodic monitoring of thyroid function tests.  6) Chronic Care/Health Maintenance:  -he  is on Statin medications and  is encouraged to continue to follow up with Ophthalmology, Dentist,  Podiatrist at least yearly or according to recommendations, and advised to  stay away from smoking. I have recommended yearly flu vaccine and pneumonia vaccination at least every 5 years;  and  sleep for at least 7 hours a day.  -His screening ABI was negative for PAD in December 2021.  This study will be repeated in December 2026, or sooner if needed. - I advised patient to maintain close follow up with Celene Squibb, MD for primary care needs.-     I spent 32 minutes in the care of the patient today including review of labs from [facility], Lipids, Thyroid Function, Hematology (current and previous including abstractions from other facilities); face-to-face time discussing  his blood glucose readings/logs, discussing hypoglycemia and hyperglycemia episodes and symptoms, medications doses, his options of short and long term treatment based on the latest standards of  care / guidelines;  discussion about incorporating lifestyle medicine;  and documenting the encounter.    Please refer to Patient Instructions for Blood Glucose Monitoring and Insulin/Medications Dosing Guide"  in media tab for additional information. Please  also refer to " Patient Self Inventory" in the Media  tab for reviewed elements of pertinent patient history.  Mark Foster participated in the discussions, expressed understanding, and voiced agreement with the above plans.  All questions were answered to his satisfaction. he is encouraged to contact clinic should he have any questions or concerns prior to his return visit.    Follow up plan: - Return in about 3 months (around 08/11/2021) for F/U with Pre-visit Labs, Meter, Logs, A1c here.Glade Lloyd, MD [facility] [address] [place], [place] [address] Phone: [phone]  Fax: [phone]    05/12/2021, 11:18 AM  This note was partially dictated with voice recognition software.  Similar sounding words can be transcribed inadequately or may not  be corrected upon review.

## 2021-05-12 NOTE — Patient Instructions (Signed)

## 2021-05-18 ENCOUNTER — Ambulatory Visit (HOSPITAL_COMMUNITY)
Admission: RE | Admit: 2021-05-18 | Discharge: 2021-05-18 | Disposition: A | Discharge status: Home / Self Care | Payer: HMO | Source: Ambulatory Visit | Attending: Hematology | Admitting: Hematology

## 2021-05-18 ENCOUNTER — Other Ambulatory Visit: Payer: Self-pay | Admitting: "Endocrinology

## 2021-05-18 ENCOUNTER — Other Ambulatory Visit: Payer: Self-pay

## 2021-05-18 ENCOUNTER — Inpatient Hospital Stay (HOSPITAL_COMMUNITY): Payer: HMO | Attending: Hematology

## 2021-05-18 VITALS — BP 139/62 | HR 66 | Temp 96.7°F | Resp 18

## 2021-05-18 DIAGNOSIS — G629 Polyneuropathy, unspecified: Secondary | ICD-10-CM | POA: Insufficient documentation

## 2021-05-18 DIAGNOSIS — I2510 Atherosclerotic heart disease of native coronary artery without angina pectoris: Secondary | ICD-10-CM | POA: Diagnosis not present

## 2021-05-18 DIAGNOSIS — K439 Ventral hernia without obstruction or gangrene: Secondary | ICD-10-CM | POA: Diagnosis not present

## 2021-05-18 DIAGNOSIS — C250 Malignant neoplasm of head of pancreas: Secondary | ICD-10-CM | POA: Insufficient documentation

## 2021-05-18 DIAGNOSIS — I700 Atherosclerosis of aorta: Secondary | ICD-10-CM | POA: Diagnosis not present

## 2021-05-18 DIAGNOSIS — C251 Malignant neoplasm of body of pancreas: Secondary | ICD-10-CM | POA: Insufficient documentation

## 2021-05-18 DIAGNOSIS — C259 Malignant neoplasm of pancreas, unspecified: Secondary | ICD-10-CM

## 2021-05-18 DIAGNOSIS — I82622 Acute embolism and thrombosis of deep veins of left upper extremity: Secondary | ICD-10-CM | POA: Diagnosis not present

## 2021-05-18 DIAGNOSIS — K862 Cyst of pancreas: Secondary | ICD-10-CM | POA: Diagnosis not present

## 2021-05-18 DIAGNOSIS — J439 Emphysema, unspecified: Secondary | ICD-10-CM | POA: Diagnosis not present

## 2021-05-18 DIAGNOSIS — C252 Malignant neoplasm of tail of pancreas: Secondary | ICD-10-CM | POA: Insufficient documentation

## 2021-05-18 DIAGNOSIS — Z87891 Personal history of nicotine dependence: Secondary | ICD-10-CM | POA: Insufficient documentation

## 2021-05-18 DIAGNOSIS — K8020 Calculus of gallbladder without cholecystitis without obstruction: Secondary | ICD-10-CM | POA: Diagnosis not present

## 2021-05-18 DIAGNOSIS — E1165 Type 2 diabetes mellitus with hyperglycemia: Secondary | ICD-10-CM

## 2021-05-18 LAB — CBC WITH DIFFERENTIAL/PLATELET
Abs Immature Granulocytes: 0 10*3/uL (ref 0.00–0.07)
Basophils Absolute: 0 10*3/uL (ref 0.0–0.1)
Basophils Relative: 1 %
Eosinophils Absolute: 0 10*3/uL (ref 0.0–0.5)
Eosinophils Relative: 1 %
HCT: 38.7 % — ABNORMAL LOW (ref 39.0–52.0)
Hemoglobin: 13.4 g/dL (ref 13.0–17.0)
Immature Granulocytes: 0 %
Lymphocytes Relative: 35 %
Lymphs Abs: 1.3 10*3/uL (ref 0.7–4.0)
MCH: 34.7 pg — ABNORMAL HIGH (ref 26.0–34.0)
MCHC: 34.6 g/dL (ref 30.0–36.0)
MCV: 100.3 fL — ABNORMAL HIGH (ref 80.0–100.0)
Monocytes Absolute: 0.2 10*3/uL (ref 0.1–1.0)
Monocytes Relative: 6 %
Neutro Abs: 2.1 10*3/uL (ref 1.7–7.7)
Neutrophils Relative %: 57 %
Platelets: 181 10*3/uL (ref 150–400)
RBC: 3.86 MIL/uL — ABNORMAL LOW (ref 4.22–5.81)
RDW: 15.9 % — ABNORMAL HIGH (ref 11.5–15.5)
WBC: 3.7 10*3/uL — ABNORMAL LOW (ref 4.0–10.5)
nRBC: 0 % (ref 0.0–0.2)

## 2021-05-18 LAB — COMPREHENSIVE METABOLIC PANEL WITH GFR
ALT: 24 U/L (ref 0–44)
AST: 19 U/L (ref 15–41)
Albumin: 3.7 g/dL (ref 3.5–5.0)
Alkaline Phosphatase: 66 U/L (ref 38–126)
Anion gap: 7 (ref 5–15)
BUN: 12 mg/dL (ref 8–23)
CO2: 25 mmol/L (ref 22–32)
Calcium: 8.5 mg/dL — ABNORMAL LOW (ref 8.9–10.3)
Chloride: 106 mmol/L (ref 98–111)
Creatinine, Ser: 0.55 mg/dL — ABNORMAL LOW (ref 0.61–1.24)
GFR, Estimated: >60 mL/min
Glucose, Bld: 132 mg/dL — ABNORMAL HIGH (ref 70–99)
Potassium: 3.7 mmol/L (ref 3.5–5.1)
Sodium: 138 mmol/L (ref 135–145)
Total Bilirubin: 1.2 mg/dL (ref 0.3–1.2)
Total Protein: 6.6 g/dL (ref 6.5–8.1)

## 2021-05-18 MED ORDER — HEPARIN SOD (PORK) LOCK FLUSH 100 UNIT/ML IV SOLN
INTRAVENOUS | Status: AC
  Administered 2021-05-18: 500 [IU] via INTRAVENOUS
  Filled 2021-05-18: qty 5

## 2021-05-18 MED ORDER — HEPARIN SOD (PORK) LOCK FLUSH 100 UNIT/ML IV SOLN: 500.0000 [IU] | Freq: Once | INTRAVENOUS | Status: AC

## 2021-05-18 MED ORDER — IOHEXOL 350 MG/ML SOLN
80.0000 mL | Freq: Once | INTRAVENOUS | Status: AC | PRN
  Administered 2021-05-18: 80 mL via INTRAVENOUS

## 2021-05-18 NOTE — Progress Notes (Deleted)
Patients port flushed without difficulty.  Good blood return noted with no bruising or swelling noted at site.   Patient remains accessed with power port kit for CT scan and treatment today.  Patient is in stable condition.

## 2021-05-18 NOTE — Progress Notes (Signed)
Patients port flushed without difficulty.  Good blood return noted with no bruising or swelling noted at site.  Patient remains accessed with power port kit for CT scan.  Patient is in stable condition.

## 2021-05-19 LAB — CANCER ANTIGEN 19-9: CA 19-9: 9 U/mL (ref 0–35)

## 2021-05-20 ENCOUNTER — Telehealth: Payer: Self-pay

## 2021-05-20 NOTE — Telephone Encounter (Signed)
I called patient because clearances were received to proceed with surgery.  Upon further review, patient's last A1c was 9.6 on 04/14/21.  I advised patient that A1c too high for surgery right now.  He stated Dr. Dorris Fetch, endocrinologist, has changed and increased his medication.  I advised would need to get A1c checked again to see if low enough to proceed with surgery.  Usually A1c needs to be less than 8 to proceed.  Higher A1c's cause higher risk of infection.  He will follow up and let us know when lower for surgery to be done.

## 2021-05-25 ENCOUNTER — Encounter (HOSPITAL_COMMUNITY): Payer: Self-pay

## 2021-05-25 ENCOUNTER — Encounter (HOSPITAL_COMMUNITY): Payer: Self-pay | Admitting: Hematology

## 2021-05-25 ENCOUNTER — Other Ambulatory Visit: Payer: Self-pay

## 2021-05-25 ENCOUNTER — Inpatient Hospital Stay (HOSPITAL_COMMUNITY): Payer: HMO | Admitting: Hematology

## 2021-05-25 VITALS — HR 74 | Temp 97.4°F | Resp 16 | Wt 212.0 lb

## 2021-05-25 DIAGNOSIS — C259 Malignant neoplasm of pancreas, unspecified: Secondary | ICD-10-CM | POA: Diagnosis not present

## 2021-05-25 DIAGNOSIS — C250 Malignant neoplasm of head of pancreas: Secondary | ICD-10-CM | POA: Diagnosis not present

## 2021-05-25 NOTE — Progress Notes (Signed)
Mark Foster [address], [place] [address]   CLINIC:  Medical Oncology/Hematology  PCP:  Celene Squibb, MD [address] Mark Foster [place] [address] [phone]   REASON FOR VISIT:  Follow-up for  pancreatic adenocarcinoma  PRIOR THERAPY:  1. Distal pancreatectomy and splenectomy at [facility] on 11/20/2018. 2. Adjuvant FOLFIRINOX x 12 cycles from 12/30/2018 to 06/04/2019.  NGS Results:  BRCA 1 heterozygous VUS  CURRENT THERAPY: Surveillance  BRIEF ONCOLOGIC HISTORY:  Oncology History  Pancreatic adenocarcinoma ([place])  08/20/2018 Imaging   CT abdomen/pelvis w/ contrast: IMPRESSION: Atrophy and ductal dilatation involving the pancreatic tail, with suspected small soft tissue mass in the pancreatic body which could represent pancreatic carcinoma. Abdomen MRI and MRCP without and with contrast is recommended for further evaluation.   No evidence of hepatobiliary disease.   08/30/2018 Imaging   MRI abdomen w/ contrast: IMPRESSION: 1. Although not definitive, there remains concern of a small hypoenhancing mass at the junction of the pancreatic body and tail associated with atrophy and ductal dilatation in the pancreatic tail. This remains concerning for pancreatic neoplasm. Postinflammatory stricture less likely. Besides a tiny cystic lesion in the pancreatic tail, there are no other signs of previous pancreatitis. Further evaluation with endoscopic ultrasound for possible biopsy strongly recommended. 2. No evidence of metastatic disease. 3. Cholelithiasis without evidence of cholecystitis or biliary dilatation.   09/03/2018 Initial Diagnosis   Pancreatic adenocarcinoma ([place])   10/03/2018 Procedure   EUS: - 2.6cm irregularly shaped mass in the body of the pancreas that causing main pancreatic duct obstruction and dilation. The mass abuts the splenic vessels but no other significant vascular structures and it was sampled with trangastric EUS FNA.  Preliminary cytology is + for malignancy, likely well-differentiated adenocarcinoma. It appears surgically resectable.   10/03/2018 Pathology Results   Accession: [id number]  FINE NEEDLE ASPIRATION, ENDOSCOPIC, PANCREAS BODY (SPECIMEN 1 OF 1 COLLECTED 10/03/18): ADENOCARCINOMA.   10/17/2018 Imaging   PET: IMPRESSION: 1. Tiny focus of hypermetabolism identified in the body of pancreas, adjacent to the abrupt cut off of the main pancreatic duct. No evidence for hypermetabolic metastatic disease in the neck, chest, abdomen, or pelvis. 2. Cholelithiasis. 3.  Aortic Atherosclerois (ICD10-170.0) 4. 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  The Hereditary Gene Panel offered by [facility] includes sequencing and/or deletion duplication testing of the following 47 genes: APC, ATM, AXIN2, BARD1, BMPR1A, BRCA1, BRCA2, BRIP1, CDH1, CDK4, CDKN2A (p14ARF), CDKN2A (p16INK4a), CHEK2, CTNNA1, DICER1, EPCAM (Deletion/duplication testing only), GREM1 (promoter region deletion/duplication testing only), KIT, MEN1, MLH1, MSH2, MSH3, MSH6, MUTYH, NBN, NF1, NHTL1, PALB2, PDGFRA, PMS2, POLD1, POLE, PTEN, RAD50, RAD51C, RAD51D, SDHB, SDHC, SDHD, SMAD4, SMARCA4. STK11, TP53, TSC1, TSC2, and VHL.  The following genes were evaluated for sequence changes only: SDHA and HOXB13 c.251G>A variant only. The report date is November 12, 2018.  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 The change in variant classification was made as a result of re-review of the evidence in light of new variant interpretation guidelines and/or new information. The amended report date is March 11, 2020.    11/20/2018 Surgery   Distal subtotal pancreatectomy with splen[facility] at [facility]   11/20/2018 Surgery   TUMOR    Tumor Site:    Pancreatic body     Histologic Type:    Ductal adenocarcinoma     Histologic Grade:  G1: Well differentiated     Tumor Size:    Greatest dimension in Centimeters (cm): 2.8 Centimeters (cm)      Additional Dimension in Centimeters (cm):    2.7 Centimeters (cm)      Additional Dimension in Centimeters (cm):    1.8 Centimeters (cm)    Tumor Extent:          Tumor Extension:    Tumor is confined to pancreas     Accessory Findings:          Treatment Effect:    No known presurgical therapy       Lymphovascular Invasion:    Not identified       Perineural Invasion:    Not identified   MARGINS    Margins:          Proximal Pancreatic Parenchymal Margin:    Uninvolved by invasive carcinoma and pancreatic high-grade intraepithelial neoplasia         Distance of Invasive Carcinoma from Margin:    0.6 Centimeters (cm)    :          Other Margin:    Splenic margin.         Margin Status:    Uninvolved by invasive carcinoma   LYMPH NODES  Number of Lymph Nodes Involved:    2   Number of Lymph Nodes Examined:    16   PATHOLOGIC STAGE CLASSIFICATION  (pTNM, AJCC 8th Edition)  TNM Descriptors:    Not applicable   Primary Tumor (pT):    pT2   Regional Lymph Nodes (pN):    pN1   ADDITIONAL FINDINGS  Additional Pathologic Findings:    Pancreatic intraepithelial neoplasia     Highest Grade (PanIN):    High grade PanIN is present.   Additional Pathologic Findings:    Benign unilocular pancreatic cyst (1.3 cm in greatest dimension) at the pancreatic tail.   12/30/2018 -  Chemotherapy   The patient had palonosetron (ALOXI) injection 0.25 mg, 0.25 mg, Intravenous,  Once, 12 of 12 cycles Administration: 0.25 mg (12/30/2018), 0.25 mg (01/13/2019), 0.25 mg (01/28/2019), 0.25 mg (02/11/2019), 0.25 mg (02/25/2019), 0.25 mg (03/11/2019), 0.25 mg (03/25/2019), 0.25 mg (04/09/2019), 0.25 mg (04/23/2019), 0.25 mg (05/07/2019), 0.25 mg (05/21/2019), 0.25 mg (06/04/2019) pegfilgrastim (NEULASTA) injection 6 mg, 6 mg, Subcutaneous, Once, 7 of 7 cycles Administration: 6 mg (01/01/2019), 6 mg (01/15/2019), 6 mg  (01/30/2019), 6 mg (02/13/2019), 6 mg (02/27/2019), 6 mg (03/13/2019), 6 mg (03/27/2019) pegfilgrastim-jmdb (FULPHILA) injection 6 mg, 6 mg, Subcutaneous,  Once, 5 of 5 cycles Administration: 6 mg (04/11/2019), 6 mg (04/25/2019), 6 mg (05/09/2019), 6 mg (05/23/2019), 6 mg (06/06/2019) irinotecan (CAMPTOSAR) 280 mg in sodium chloride 0.9 % 500 mL chemo infusion, 150 mg/m2 = 280 mg (100 % of original dose 150 mg/m2), Intravenous,  Once, 12 of 12 cycles Dose modification: 150 mg/m2 (original dose 150 mg/m2, Cycle 1, Reason: Provider Judgment) Administration: 280 mg (12/30/2018), 280 mg (01/13/2019), 280 mg (01/28/2019), 280 mg (02/11/2019), 280 mg (02/25/2019), 280 mg (03/11/2019), 280 mg (03/25/2019), 280 mg (04/09/2019), 280 mg (04/23/2019), 280 mg (05/07/2019), 280 mg (05/21/2019), 280 mg (06/04/2019) leucovorin 700 mg in sodium chloride 0.9 % 250 mL infusion, 744 mg, Intravenous,  Once, 12 of 12 cycles Administration: 700 mg (12/30/2018), 700 mg (01/13/2019), 700 mg (01/28/2019), 700 mg (02/11/2019), 700 mg (02/25/2019), 700 mg (03/11/2019), 700 mg (03/25/2019), 700 mg (04/09/2019), 700 mg (04/23/2019), 700 mg (05/07/2019), 700 mg (05/21/2019), 700 mg (06/04/2019) oxaliplatin (ELOXATIN) 120 mg in dextrose  5 % 500 mL chemo infusion, 63.75 mg/m2 = 120 mg (75 % of original dose 85 mg/m2), Intravenous,  Once, 12 of 12 cycles Dose modification: 63.75 mg/m2 (75 % of original dose 85 mg/m2, Cycle 1, Reason: Other (see comments), Comment: high risk for worsening neuropathy), 42.5 mg/m2 (50 % of original dose 85 mg/m2, Cycle 11, Reason: Other (see comments), Comment: neuropathy) Administration: 120 mg (12/30/2018), 120 mg (01/13/2019), 120 mg (01/28/2019), 120 mg (02/11/2019), 120 mg (02/25/2019), 120 mg (03/11/2019), 120 mg (03/25/2019), 120 mg (04/09/2019), 120 mg (04/23/2019), 120 mg (05/07/2019), 80 mg (05/21/2019), 80 mg (06/04/2019) fosaprepitant (EMEND) 150 mg, dexamethasone (DECADRON) 12 mg in sodium chloride 0.9 % 145 mL IVPB, , Intravenous,  Once, 12 of 12  cycles Administration:  (12/30/2018),  (01/13/2019),  (01/28/2019),  (02/11/2019),  (02/25/2019),  (03/11/2019),  (03/25/2019),  (04/09/2019),  (04/23/2019),  (05/07/2019),  (05/21/2019),  (06/04/2019) fluorouracil (ADRUCIL) 4,450 mg in sodium chloride 0.9 % 61 mL chemo infusion, 2,400 mg/m2 = 4,450 mg, Intravenous, 1 Day/Dose, 12 of 12 cycles Administration: 4,450 mg (12/30/2018), 4,450 mg (01/13/2019), 4,450 mg (01/28/2019), 4,450 mg (02/11/2019), 4,450 mg (02/25/2019), 4,450 mg (03/11/2019), 4,450 mg (03/25/2019), 4,450 mg (04/09/2019), 4,450 mg (04/23/2019), 4,450 mg (05/07/2019), 4,450 mg (05/21/2019), 4,450 mg (06/04/2019)   for chemotherapy treatment.       CANCER STAGING: Cancer Staging No matching staging information was found for the patient.  INTERVAL HISTORY:  Mr. Mark Foster, a 65 y.o. male, returns for routine follow-up of his  pancreatic adenocarcinoma. Mark Foster was last seen on 11/22/2020.   Today he reports feeling good. He denies n/v/d. He is taking Eliquis and tolerating it well. He reports continued stable neuropathy. He reports difficulties with balance.  REVIEW OF SYSTEMS:  Review of Systems  Constitutional:  Negative for appetite change and fatigue.  Gastrointestinal:  Negative for diarrhea, nausea and vomiting.  Neurological:  Positive for numbness (neuropathy stable).  All other systems reviewed and are negative.  PAST MEDICAL/SURGICAL HISTORY:  Past Medical History:  Diagnosis Date   Bilateral carpal tunnel syndrome 10/19/2020   CAD (coronary artery disease)    a. 01/2020: CABG x4 with LIMA-LAD, SVG-OM, SVG-PDA and SVG-D1   Carotid artery obstruction, left    Left ICA occlusion   Cervical compression fracture (HCC)    Cervical spinal stenosis    Diabetic neuropathy (H[facility])    Bilateral legs   Diverticulitis    Family history of pancreatic cancer    History of kidney stones    Hypothyroidism    Pancreatic cancer ([place])    Stenosis of left vertebral artery    Stroke (cerebrum) ([place])  10/09/2019   Type 2 diabetes mellitus ([place])    Past Surgical History:  Procedure Laterality Date   APPENDECTOMY     BUBBLE STUDY N/A 11/18/2019   Procedure: BUBBLE STUDY;  Surgeon: Herminio Commons, MD;  Location: [facility];  Service: Cardiology;  Laterality: N/A;   CARDIAC SURGERY     COLON RESECTION     For diverticulitis   COLON SURGERY     CORONARY ARTERY BYPASS GRAFT N/A 01/26/2020   Procedure: CORONARY ARTERY BYPASS GRAFTING (CABG) TIMES FOUR USING LEFT INTERNAL MAMMARY VEIN AND RIGHT GREATER SAPHENOUS VEIN;  Surgeon: Melrose Nakayama, MD;  Location: [facility];  Service: Open Heart Surgery;  Laterality: N/A;   ENDOVEIN HARVEST OF GREATER SAPHENOUS VEIN Right 01/26/2020   Procedure: [place] Of Greater Saphenous Vein;  Surgeon: Melrose Nakayama, MD;  Location: [facility];  Service: Open  Heart Surgery;  Laterality: Right;   ESOPHAGOGASTRODUODENOSCOPY N/A 10/03/2018   Procedure: ESOPHAGOGASTRODUODENOSCOPY (EGD);  Surgeon: [name], MD;  Location: [name];  Service: Endoscopy;  Laterality: N/A;   EUS N/A 10/03/2018   Procedure: UPPER ENDOSCOPIC ULTRASOUND (EUS) RADIAL;  Surgeon: [name], MD;  Location: [facility];  Service: Endoscopy;  Laterality: N/A;   EYE SURGERY Bilateral    FINE NEEDLE ASPIRATION N/A 10/03/2018   Procedure: FINE NEEDLE ASPIRATION (FNA) LINEAR;  Surgeon: [name], MD;  Location: [facility];  Service: Endoscopy;  Laterality: N/A;   IR ANGIO INTRA EXTRACRAN SEL COM CAROTID INNOMINATE BILAT MOD SED  01/21/2018   IR ANGIO INTRA EXTRACRAN SEL COM CAROTID INNOMINATE UNI R MOD SED  03/21/2018   IR ANGIO VERTEBRAL SEL VERTEBRAL BILAT MOD SED  01/21/2018   IR TRANSCATH EXCRAN VERT OR CAR A STENT  03/21/2018   LEFT HEART CATH AND CORONARY ANGIOGRAPHY N/A 01/08/2020   Procedure: LEFT HEART CATH AND CORONARY ANGIOGRAPHY;  Surgeon: Leonie Man, MD;  Location: [facility];  Service: Cardiovascular;  Laterality: N/A;   PORTACATH  PLACEMENT Left 12/23/2018   Procedure: INSERTION PORT-A-CATH;  Surgeon: Virl Cagey, MD;  Location: [facility];  Service: General;  Laterality: Left;   RADIOLOGY WITH ANESTHESIA N/A 03/21/2018   Procedure: IR WITH ANESTHESIA WITH STENT PLACEMENT;  Surgeon: Luanne Bras, MD;  Location: [facility];  Service: Radiology;  Laterality: N/A;   ROTATOR CUFF REPAIR     Left   SPLENECTOMY     TEE WITHOUT CARDIOVERSION N/A 11/18/2019   Procedure: TRANSESOPHAGEAL ECHOCARDIOGRAM (TEE) WITH PROPOFOL;  Surgeon: Herminio Commons, MD;  Location: [facility];  Service: Cardiology;  Laterality: N/A;   TEE WITHOUT CARDIOVERSION N/A 01/26/2020   Procedure: TRANSESOPHAGEAL ECHOCARDIOGRAM (TEE);  Surgeon: Melrose Nakayama, MD;  Location: [facility];  Service: Open Heart Surgery;  Laterality: N/A;   TONSILLECTOMY      SOCIAL HISTORY:  Social History   Socioeconomic History   Marital status: Married    Spouse name: Not on file   Number of children: 2   Years of education: Not on file   Highest education level: Not on file  Occupational History   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  Tobacco Use   Smoking status: Former    Types: Cigarettes    Start date: 10/06/2019   Smokeless tobacco: Never  Vaping Use   Vaping Use: Never used  Substance and Sexual Activity   Alcohol use: Yes    Alcohol/week: 1.0 standard drink    Types: 1 Cans of beer per week    Comment: rare    Drug use: Never   Sexual activity: Not on file  Other Topics Concern   Not on file  Social History Narrative   Not on file   Social Determinants of Health   Financial Resource Strain: Not on file  Food Insecurity: Not on file  Transportation Needs: Not on file  Physical Activity: Not on file  Stress: Not on file  Social Connections: Not on file  Intimate Partner Violence: Not At Risk   Fear of Current or Ex-Partner: No   Emotionally Abused: No   Physically Abused: No   Sexually Abused: No    FAMILY HISTORY:  Family History   Problem Relation Age of Onset   Stroke Mother    Pancreatic cancer Father 58       d. 52   Stroke Maternal Grandmother    Heart attack Maternal Grandfather  Heart Problems Paternal Grandfather    Scoliosis Daughter    Muscular dystrophy Grandson     CURRENT MEDICATIONS:  Current Outpatient Medications  Medication Sig Dispense Refill   apixaban (ELIQUIS) 5 MG TABS tablet Take 1 tablet (5 mg total) by mouth 2 (two) times daily. 180 tablet 1   aspirin EC 81 MG EC tablet Take 1 tablet (81 mg total) by mouth daily.     atorvastatin (LIPITOR) 80 MG tablet Take 1 tablet (80 mg total) by mouth daily at 6 PM. 90 tablet 1   Continuous Blood Gluc Receiver (FREESTYLE LIBRE 2 READER) DEVI As directed 1 each 0   Continuous Blood Gluc Sensor (FREESTYLE LIBRE 2 SENSOR) MISC USE AS DIRECTED TO CHECK BLOOD SUGAR, REPLACE EVERY 14 DAYS 2 each 2   FREESTYLE PRECISION NEO TEST test strip USE TO TEST BLOOD SUGAR 4 TIMES DAILY. 100 strip 0   gabapentin (NEURONTIN) 300 MG capsule Take 1 capsule (300 mg total) by mouth 3 (three) times daily. 270 capsule 3   insulin aspart (NOVOLOG FLEXPEN) 100 UNIT/ML FlexPen Inject 14-20 Units into the skin 3 (three) times daily before meals. 30 mL 1   insulin glargine, 2 Unit Dial, (TOUJEO MAX SOLOSTAR) 300 UNIT/ML Solostar Pen Inject 60 Units into the skin at bedtime. 6 mL 2   Insulin Pen Needle 32G X 4 MM MISC 1 Device by Does not apply route daily. Use as directed to inject insulin four times daily 200 each 2   levothyroxine (SYNTHROID) 50 MCG tablet TAKE 1 TABLET EVERY DAY 90 tablet 1   lidocaine-prilocaine (EMLA) cream Apply 1 application topically daily as needed (prior to port being accessed (~every 3 months)).     metoprolol tartrate (LOPRESSOR) 25 MG tablet Take 1 tablet (25 mg total) by mouth 2 (two) times daily. 180 tablet 3   nitroGLYCERIN (NITROSTAT) 0.4 MG SL tablet Place 1 tablet (0.4 mg total) under the tongue every 5 (five) minutes as needed for chest pain.  100 tablet 3   Omega-3 1000 MG CAPS Take 1,000 mg by mouth daily.     ondansetron (ZOFRAN) 4 MG tablet Take by mouth.     sertraline (ZOLOFT) 50 MG tablet TAKE 1 TABLET BY MOUTH DAILY (Patient taking differently: Take 50 mg by mouth daily.) 90 tablet 1   tamsulosin (FLOMAX) 0.4 MG CAPS capsule Take 1 capsule (0.4 mg total) by mouth daily. 90 capsule 3   traMADol (ULTRAM) 50 MG tablet Take 50 mg by mouth 4 (four) times daily as needed.     vitamin B-12 (CYANOCOBALAMIN) 1000 MCG tablet Take 1,000 mcg by mouth daily.     No current facility-administered medications for this visit.   Facility-Administered Medications Ordered in Other Visits  Medication Dose Route Frequency Provider Last Rate Last Admin   0.9 %  sodium chloride infusion   Intravenous Continuous Derek Jack, MD 20 mL/hr at 12/30/18 1351 New Bag at 12/30/18 1351   0.9 %  sodium chloride infusion   Intravenous Continuous Derek Jack, MD 20 mL/hr at 12/30/18 1401 New Bag at 12/30/18 1401   dextrose 5 % solution   Intravenous Once Derek Jack, MD       heparin lock flush 100 unit/mL  500 Units Intracatheter Once PRN Derek Jack, MD       sodium chloride flush (NS) 0.9 % injection 10 mL  10 mL Intracatheter PRN Derek Jack, MD   10 mL at 12/30/18 0911   sodium chloride flush (NS) 0.9 %  injection 10 mL  10 mL Intracatheter PRN Derek Jack, MD   10 mL at 05/21/19 0845    ALLERGIES:  Allergies  Allergen Reactions   Demerol [Meperidine Hcl] Other (See Comments)    convulsions    PHYSICAL EXAM:  Performance status (ECOG): 1 - Symptomatic but completely ambulatory  There were no vitals filed for this visit. Wt Readings from Last 3 Encounters:  05/12/21 207 lb 6.4 oz (94.1 kg)  04/18/21 208 lb (94.3 kg)  04/14/21 206 lb 6.4 oz (93.6 kg)   Physical Exam Vitals reviewed.  Constitutional:      Appearance: Normal appearance.  Cardiovascular:     Rate and Rhythm: Normal rate and  regular rhythm.     Pulses: Normal pulses.     Heart sounds: Normal heart sounds.  Pulmonary:     Effort: Pulmonary effort is normal.     Breath sounds: Normal breath sounds.  Neurological:     General: No focal deficit present.     Mental Status: He is alert and oriented to person, place, and time.  Psychiatric:        Mood and Affect: Mood normal.        Behavior: Behavior normal.     LABORATORY DATA:  I have reviewed the labs as listed.  CBC Latest Ref Rng & Units 05/18/2021 02/22/2021 11/15/2020  WBC 4.0 - 10.5 K/uL 3.7(L) 3.4(L) 3.5(L)  Hemoglobin 13.0 - 17.0 g/dL 13.4 13.9 14.3  Hematocrit 39.0 - 52.0 % 38.7(L) 40.0 42.7  Platelets 150 - 400 K/uL 181 183 198   CMP Latest Ref Rng & Units 05/18/2021 02/22/2021 11/15/2020  Glucose 70 - 99 mg/dL 132(H) 206(H) 152(H)  BUN 8 - 23 mg/dL $Remove'12 14 16  'iJgSXvw$ Creatinine 0.61 - 1.24 mg/dL 0.55(L) 0.73 0.62  Sodium 135 - 145 mmol/L 138 138 135  Potassium 3.5 - 5.1 mmol/L 3.7 3.6 3.9  Chloride 98 - 111 mmol/L 106 106 103  CO2 22 - 32 mmol/L $RemoveB'25 25 25  'bVYbMOfD$ Calcium 8.9 - 10.3 mg/dL 8.5(L) 8.5(L) 8.7(L)  Total Protein 6.5 - 8.1 g/dL 6.6 6.8 7.1  Total Bilirubin 0.3 - 1.2 mg/dL 1.2 0.9 1.1  Alkaline Phos 38 - 126 U/L 66 69 64  AST 15 - 41 U/L $Remo'19 22 21  'HCKgx$ ALT 0 - 44 U/L $Remo'24 23 21    'buzPH$ DIAGNOSTIC IMAGING:  I have independently reviewed the scans and discussed with the patient. CT CHEST ABDOMEN PELVIS W CONTRAST  Result Date: 05/18/2021 CLINICAL DATA:  Pancreatic cancer follow-up status post distal pancreatectomy and splenectomy. EXAM: CT CHEST, ABDOMEN, AND PELVIS WITH CONTRAST TECHNIQUE: Multidetector CT imaging of the chest, abdomen and pelvis was performed following the standard protocol during bolus administration of intravenous contrast. CONTRAST:  62mL OMNIPAQUE IOHEXOL 350 MG/ML SOLN COMPARISON:  CTA chest abdomen and pelvis dated November 15, 2020 FINDINGS: CT CHEST FINDINGS Cardiovascular: Normal heart size. No pericardial effusion. Three-vessel coronary  artery calcifications status post CABG. Left chest wall port with tip in the upper SVC. Atherosclerotic disease of the thoracic aorta. Mediastinum/Nodes: No pathologically enlarged lymph nodes seen in the chest. Esophagus and thyroid are unremarkable. Lungs/Pleura: Central airways are patent. Mild for lobe centrilobular emphysema no consolidation, pleural effusion or pneumothorax. No suspicious pulmonary nodules. Musculoskeletal: Prior median sternotomy with intact sternal wires. No aggressive appearing osseous lesions. CT ABDOMEN PELVIS FINDINGS Hepatobiliary: No suspicious liver lesions. Cholelithiasis with no evidence of acute cholecystitis. No biliary ductal dilation. Pancreas: Status post distal pancreatectomy. Stable small cystic lesion  along the uncinate process of the pancreas on series 3, image 172 measuring to 1.4 cm. Spleen: Status post splenectomy. Adrenals/Urinary Tract: Adrenal glands are unremarkable. Kidneys are normal, without renal calculi, focal lesion, or hydronephrosis. Diverticula of the left lateral wall of the bladder. Stomach/Bowel: Stomach is within normal limits. Appendix not visualized. No evidence of bowel wall thickening, distention, or inflammatory changes. Vascular/Lymphatic: No significant vascular findings are present. No enlarged abdominal or pelvic lymph nodes. Reproductive: Prostatomegaly, measuring up to 6.0 cm. Other: Fat containing ventral abdominal wall hernias, unchanged compared to prior. No abdominopelvic ascites. Musculoskeletal: No acute or significant osseous findings. IMPRESSION: No evidence of recurrent or metastatic disease. Stable small cystic lesion along the uncinate process of the pancreas measuring up to 1.4 cm. Aortic Atherosclerosis (ICD10-I70.0) and Emphysema (ICD10-J43.9). Electronically Signed   By: Yetta Glassman M.D.   On: 05/18/2021 19:29     ASSESSMENT:  1.  Stage IIb (T2N1) pancreatic adenocarcinoma: -Status post distal pancreatectomy and  splenectomy at [facility] by Dr. Zenia Resides on 11/20/2018. 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 -12 cycles of FOLFIRINOX from 12/30/2018 through 06/04/2019. -MRI of the brain on 05/19/2019 did not show any abnormalities.  This was done because of new onset numbness in the left lower lip during therapy. -CT CAP on 03/24/2020 shows unchanged low-attenuation lesion of the pancreatic uncinate measuring 1.2 x 0.8 cm.  Unchanged low-attenuation lesion near the surgical margin of the pancreatic head and neck junction measuring 1.2 x 0.9 cm.  These may reflect postoperative seroma.  Unchanged prominent retroperitoneal lymph nodes.   2.  Post splenectomy state: -He received vaccination prior to splenectomy.   PLAN:  1.  Stage IIb (T2N1) pancreatic adenocarcinoma: - He does not have any clinical signs or symptoms of recurrence.  No GI symptoms.  No abdominal pains. - Reviewed labs from 05/18/2021.  CBC was grossly within normal limits.  Comprehensive metabolic panel was normal. - CA 19-9 was 9. - Reviewed CT CAP from 05/18/2021 which did not show any evidence of recurrence or metastatic disease.  Stable small cystic lesion in the uncinate process of the pancreas measuring up to 1.4 cm. - RTC 6 months with repeat CT [facility] and tumor marker.   2.  Left upper extremity DVT: - He can have a Port-A-Cath discontinued by Dr. Constance Haw.  He can discontinue Eliquis 2 days prior to planned procedure.  Since this is a DVT caused by the port, he does not need to go back on Eliquis at this time.   3.  Neuropathy: - He is following up with neurosurgery for cervical stenosis surgery after better control of hemoglobin A1c.   Orders placed this encounter:  No orders of the defined types were placed in this encounter.    Derek Jack, MD [place] [phone]   I, Thana Ates, am acting as a scribe for Dr. Derek Jack.  I, Derek Jack MD, have reviewed the above documentation for  accuracy and completeness, and I agree with the above.

## 2021-05-25 NOTE — Patient Instructions (Addendum)
[place] at [facility] Discharge Instructions  You were seen today by Dr. Delton Coombes. He went over your recent results and scans. You will be scheduled to have your port removed by Dr. Constance Haw. You will stop Eliquis 48 hours prior to your port removal. You will also be scheduled for a CT scan of your abdomen and pelvis prior to your next visit. Dr. Delton Coombes will see you back in 6 months for labs and follow up.   Thank you for choosing [place] at [facility] to provide your oncology and hematology care.  To afford each patient quality time with our provider, please arrive at least 15 minutes before your scheduled appointment time.   If you have a lab appointment with the [place] please come in thru the Main Entrance and check in at the main information desk  You need to re-schedule your appointment should you arrive 10 or more minutes late.  We strive to give you quality time with our providers, and arriving late affects you and other patients whose appointments are after yours.  Also, if you no show three or more times for appointments you may be dismissed from the clinic at the providers discretion.     Again, thank you for choosing [facility]  Our hope is that these requests will decrease the amount of time that you wait before being seen by our physicians.       _____________________________________________________________  Should you have questions after your visit to [facility], please contact our office at [phone] between the hours of 8:00 a.m. and 4:30 p.m.  Voicemails left after 4:00 p.m. will not be returned until the following business day.  For prescription refill requests, have your pharmacy contact our office and allow 72 hours.    Cancer Center Support Programs:   > Cancer Support Group  2nd Tuesday of the month 1pm-2pm, Journey Room

## 2021-05-27 ENCOUNTER — Other Ambulatory Visit: Payer: Self-pay | Admitting: "Endocrinology

## 2021-06-01 ENCOUNTER — Telehealth (HOSPITAL_COMMUNITY): Payer: Self-pay

## 2021-06-01 DIAGNOSIS — I10 Essential (primary) hypertension: Secondary | ICD-10-CM | POA: Diagnosis not present

## 2021-06-01 DIAGNOSIS — E782 Mixed hyperlipidemia: Secondary | ICD-10-CM | POA: Diagnosis not present

## 2021-06-01 DIAGNOSIS — E119 Type 2 diabetes mellitus without complications: Secondary | ICD-10-CM | POA: Diagnosis not present

## 2021-06-01 DIAGNOSIS — E039 Hypothyroidism, unspecified: Secondary | ICD-10-CM | POA: Diagnosis not present

## 2021-06-01 NOTE — Telephone Encounter (Signed)
Called to schedule cta head/neck, no answer, left vm. AW 

## 2021-06-03 DIAGNOSIS — I10 Essential (primary) hypertension: Secondary | ICD-10-CM | POA: Diagnosis not present

## 2021-06-03 DIAGNOSIS — E1165 Type 2 diabetes mellitus with hyperglycemia: Secondary | ICD-10-CM | POA: Diagnosis not present

## 2021-06-10 DIAGNOSIS — M545 Low back pain, unspecified: Secondary | ICD-10-CM | POA: Diagnosis not present

## 2021-06-10 DIAGNOSIS — Z0001 Encounter for general adult medical examination with abnormal findings: Secondary | ICD-10-CM | POA: Diagnosis not present

## 2021-06-10 DIAGNOSIS — K219 Gastro-esophageal reflux disease without esophagitis: Secondary | ICD-10-CM | POA: Diagnosis not present

## 2021-06-10 DIAGNOSIS — G40309 Generalized idiopathic epilepsy and epileptic syndromes, not intractable, without status epilepticus: Secondary | ICD-10-CM | POA: Diagnosis not present

## 2021-06-10 DIAGNOSIS — I2510 Atherosclerotic heart disease of native coronary artery without angina pectoris: Secondary | ICD-10-CM | POA: Diagnosis not present

## 2021-06-10 DIAGNOSIS — E782 Mixed hyperlipidemia: Secondary | ICD-10-CM | POA: Diagnosis not present

## 2021-06-10 DIAGNOSIS — Z7901 Long term (current) use of anticoagulants: Secondary | ICD-10-CM | POA: Diagnosis not present

## 2021-06-10 DIAGNOSIS — I10 Essential (primary) hypertension: Secondary | ICD-10-CM | POA: Diagnosis not present

## 2021-06-10 DIAGNOSIS — E039 Hypothyroidism, unspecified: Secondary | ICD-10-CM | POA: Diagnosis not present

## 2021-06-10 DIAGNOSIS — E119 Type 2 diabetes mellitus without complications: Secondary | ICD-10-CM | POA: Diagnosis not present

## 2021-06-10 DIAGNOSIS — M4802 Spinal stenosis, cervical region: Secondary | ICD-10-CM | POA: Diagnosis not present

## 2021-06-10 DIAGNOSIS — N400 Enlarged prostate without lower urinary tract symptoms: Secondary | ICD-10-CM | POA: Diagnosis not present

## 2021-06-14 ENCOUNTER — Ambulatory Visit: Payer: HMO | Admitting: General Surgery

## 2021-06-14 ENCOUNTER — Other Ambulatory Visit: Payer: Self-pay

## 2021-06-14 ENCOUNTER — Other Ambulatory Visit (HOSPITAL_COMMUNITY): Payer: Self-pay | Admitting: Interventional Radiology

## 2021-06-14 VITALS — BP 118/77 | HR 65 | Temp 98.7°F | Resp 14 | Ht 70.0 in | Wt 209.0 lb

## 2021-06-14 DIAGNOSIS — I771 Stricture of artery: Secondary | ICD-10-CM

## 2021-06-14 DIAGNOSIS — Z95828 Presence of other vascular implants and grafts: Secondary | ICD-10-CM

## 2021-06-14 NOTE — Progress Notes (Signed)
[facility] History and Physical  Reason for Referral: Port removal  Referring Physician: Dr. Delton Coombes   Chief Complaint   New Patient (Initial Visit)     Mark Foster is a 65 y.o. male.  HPI: Mark Foster is a 65 yo with a history of pancreatic cancer s/p resection and treatment. I placed his port several years ago and then he developed a DVT and has been on eliquis. He no longer needs to port and is going to come off the eliquis. Dr. Delton Coombes has seen him for port removal. He denies any left arm swelling and is otherwise been doing well.    Past Medical History:  Diagnosis Date   Bilateral carpal tunnel syndrome 10/19/2020   CAD (coronary artery disease)    a. 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   Type 2 diabetes mellitus ([place])     Past Surgical History:  Procedure Laterality Date   APPENDECTOMY     BUBBLE STUDY N/A 11/18/2019   Procedure: BUBBLE STUDY;  Surgeon: Herminio Commons, MD;  [facility]: [facility];  Service: Cardiology;  Laterality: N/A;   CARDIAC SURGERY     COLON RESECTION     For diverticulitis   COLON SURGERY     CORONARY ARTERY BYPASS GRAFT N/A 01/26/2020   Procedure: CORONARY ARTERY BYPASS GRAFTING (CABG) TIMES FOUR USING LEFT INTERNAL MAMMARY VEIN AND RIGHT GREATER SAPHENOUS VEIN;  Surgeon: Melrose Nakayama, MD;  Location: [place];  Service: Open Heart Surgery;  Laterality: N/A;   ENDOVEIN HARVEST OF GREATER SAPHENOUS VEIN Right 01/26/2020   Procedure: [place] Of Greater Saphenous Vein;  Surgeon: Melrose Nakayama, MD;   Location: [place];  Service: Open Heart Surgery;  Laterality: Right;   ESOPHAGOGASTRODUODENOSCOPY N/A 10/03/2018   Procedure: ESOPHAGOGASTRODUODENOSCOPY (EGD);  Surgeon: [name], MD;  Location: [name];  Service: Endoscopy;  Laterality: N/A;   EUS N/A 10/03/2018   Procedure: UPPER ENDOSCOPIC ULTRASOUND (EUS) RADIAL;  Surgeon: [name], MD;  Location: [facility];  Service: Endoscopy;  Laterality: N/A;   EYE SURGERY Bilateral    FINE NEEDLE ASPIRATION N/A 10/03/2018   Procedure: FINE NEEDLE ASPIRATION (FNA) LINEAR;  Surgeon: [name], MD;  [facility];  Service: Endoscopy;  Laterality: N/A;   IR ANGIO INTRA EXTRACRAN SEL COM CAROTID INNOMINATE BILAT MOD SED  01/21/2018   IR ANGIO INTRA EXTRACRAN SEL COM CAROTID INNOMINATE UNI R MOD SED  03/21/2018   IR ANGIO VERTEBRAL SEL VERTEBRAL BILAT MOD SED  01/21/2018   IR TRANSCATH EXCRAN VERT OR CAR A STENT  03/21/2018   LEFT HEART CATH AND CORONARY ANGIOGRAPHY N/A 01/08/2020   Procedure: LEFT HEART CATH AND CORONARY ANGIOGRAPHY;  Surgeon: Leonie Man, MD;  Location: [place] [facility];  Service: Cardiovascular;  Laterality: N/A;   PORTACATH PLACEMENT Left 12/23/2018   Procedure: INSERTION PORT-A-CATH;  Surgeon: Virl Cagey, MD;  [facility]: [facility];  Service: General;  Laterality: Left;   RADIOLOGY WITH ANESTHESIA N/A 03/21/2018   Procedure: IR  WITH ANESTHESIA WITH STENT PLACEMENT;  Surgeon: Luanne Bras, MD;  Location: [place];  Service: Radiology;  Laterality: N/A;   ROTATOR CUFF REPAIR     Left   SPLENECTOMY     TEE WITHOUT CARDIOVERSION N/A 11/18/2019   Procedure: TRANSESOPHAGEAL ECHOCARDIOGRAM (TEE) WITH PROPOFOL;  Surgeon: Herminio Commons, MD;  [facility];  Service: Cardiology;  Laterality: N/A;   TEE WITHOUT CARDIOVERSION N/A 01/26/2020   Procedure: TRANSESOPHAGEAL ECHOCARDIOGRAM (TEE);  Surgeon: Melrose Nakayama, MD;  Location: [place];  Service: Open Heart Surgery;  Laterality: N/A;    TONSILLECTOMY      Family History  Problem Relation Age of Onset   Stroke Mother    Pancreatic cancer Father 44       d. 51   Stroke Maternal Grandmother    Heart attack Maternal Grandfather    Heart Problems Paternal Grandfather    Scoliosis Daughter    Muscular dystrophy Grandson     Social History   Tobacco Use   Smoking status: Former    Types: Cigarettes    Start date: 10/06/2019   Smokeless tobacco: Never  Vaping Use   Vaping Use: Never used  Substance Use Topics   Alcohol use: Yes    Alcohol/week: 1.0 standard drink    Types: 1 Cans of beer per week    Comment: rare    Drug use: Never    Medications: I have reviewed the patient's current medications. Allergies as of 06/14/2021       Reactions   Demerol [meperidine Hcl] Other (See Comments)   convulsions        Medication List        Accurate as of June 14, 2021 10:08 AM. If you have any questions, ask your nurse or doctor.          apixaban 5 MG Tabs tablet Commonly known as: ELIQUIS Take 1 tablet (5 mg total) by mouth 2 (two) times daily.   aspirin 81 MG EC tablet Take 1 tablet (81 mg total) by mouth daily.   atorvastatin 80 MG tablet Commonly known as: LIPITOR Take 1 tablet (80 mg total) by mouth daily at 6 PM.   FreeStyle Libre 2 Reader Kerrin Mo As directed   [employer] 2 Sensor Misc USE AS DIRECTED TO CHECK BLOOD SUGAR, REPLACE EVERY 14 DAYS   FreeStyle Precision Neo Test test strip Generic drug: glucose blood USE TO TEST BLOOD SUGAR 4 TIMES DAILY.   gabapentin 300 MG capsule Commonly known as: NEURONTIN Take 1 capsule (300 mg total) by mouth 3 (three) times daily.   Insulin Pen Needle 32G X 4 MM Misc 1 Device by Does not apply route daily. Use as directed to inject insulin four times daily   levothyroxine 50 MCG tablet Commonly known as: SYNTHROID TAKE 1 TABLET EVERY DAY   lidocaine-prilocaine cream Commonly known as: EMLA Apply 1 application topically daily as needed  (prior to port being accessed (~every 3 months)).   metoprolol tartrate 25 MG tablet Commonly known as: LOPRESSOR Take 1 tablet (25 mg total) by mouth 2 (two) times daily.   nitroGLYCERIN 0.4 MG SL tablet Commonly known as: NITROSTAT Place 1 tablet (0.4 mg total) under the tongue every 5 (five) minutes as needed for chest pain.   NovoLOG FlexPen 100 UNIT/ML FlexPen Generic drug: insulin aspart Inject 14-20 Units into the skin 3 (three) times daily before meals.   Omega-3 1000 MG Caps Take 1,000 mg by mouth daily.   ondansetron  4 MG tablet Commonly known as: ZOFRAN Take by mouth.   sertraline 50 MG tablet Commonly known as: ZOLOFT TAKE 1 TABLET BY MOUTH DAILY   tamsulosin 0.4 MG Caps capsule Commonly known as: FLOMAX Take 1 capsule (0.4 mg total) by mouth daily.   Toujeo Max SoloStar 300 UNIT/ML Solostar Pen Generic drug: insulin glargine (2 Unit Dial) Inject 60 Units into the skin at bedtime.   traMADol 50 MG tablet Commonly known as: ULTRAM Take 50 mg by mouth 4 (four) times daily as needed.   vitamin B-12 1000 MCG tablet Commonly known as: CYANOCOBALAMIN Take 1,000 mcg by mouth daily.         ROS:  A comprehensive review of systems was negative except for: Musculoskeletal: positive for back pain and neck pain Neurological: positive for dizziness and numbness  Blood pressure 118/77, pulse 65, temperature 98.7 F (37.1 C), temperature source Other (Comment), resp. rate 14, height 5\' 10"  (1.778 m), weight 209 lb (94.8 kg), SpO2 95 %. Physical Exam Vitals reviewed.  Constitutional:      Appearance: Normal appearance.  HENT:     Head: Normocephalic.     Nose: Nose normal.     Mouth/Throat:     Mouth: Mucous membranes are moist.  Eyes:     Extraocular Movements: Extraocular movements intact.  Cardiovascular:     Rate and Rhythm: Normal rate and regular rhythm.  Pulmonary:     Effort: Pulmonary effort is normal.     Breath sounds: Normal breath sounds.   Chest:     Comments: Left upper chest, port in place Abdominal:     General: There is no distension.     Palpations: Abdomen is soft.     Tenderness: There is no abdominal tenderness.  Musculoskeletal:        General: Normal range of motion.  Skin:    General: Skin is warm.  Neurological:     General: No focal deficit present.     Mental Status: He is alert and oriented to person, place, and time.  Psychiatric:        Mood and Affect: Mood normal.        Behavior: Behavior normal.        Thought Content: Thought content normal.    Results: None   Assessment & Plan:  SIMRAN MANNIS is a 65 y.o. male with a port in place that needs to be removed. He had a prior DVT and is off the eliquis now.  -Discussed excision and risk of bleeding, infection, incomplete removal, and post removal CXR.    All questions were answered to the satisfaction of the patient and family.     Virl Cagey 06/14/2021, 10:08 AM

## 2021-06-14 NOTE — Patient Instructions (Signed)
Hold your Eliquis before the procedure.   Implanted Port Removal Implanted port removal is a procedure to remove the port and catheter that are implanted under your skin. The port is a small disc under your skin that can be punctured with a needle. It is connected to a vein in your chest or neck by a small flexible tube (catheter). The implanted port is used to give medicines for treatments, and it may also be used to take blood samples. Your health care provider will remove the implanted port if: You no longer need it for treatment. It is not working properly. The area around it gets infected. Tell a health care provider about: Any allergies you have. All medicines you are taking, including vitamins, herbs, eye drops, creams, and over-the-counter medicines. Any problems you or family members have had with anesthetic medicines. Any blood disorders you have. Any surgeries you have had. Any medical conditions you have. Whether you are pregnant or may be pregnant. What are the risks? Generally, this is a safe procedure. However, problems may occur, including: Infection. Bleeding. Allergic reactions to anesthetic medicines. Damage to nerves or blood vessels. What happens before the procedure? Medicines Ask your health care provider about: Changing or stopping your regular medicines. This is especially important if you are taking diabetes medicines or blood thinners. Taking medicines such as aspirin and ibuprofen. These medicines can thin your blood. Do not take these medicines unless your health care provider tells you to take them. Taking over-the-counter medicines, vitamins, herbs, and supplements. General instructions You will have: A physical exam. Blood tests. Imaging tests, including a chest X-ray. Follow instructions from your health care provider about eating or drinking restrictions. Ask your health care provider how your surgical site will be marked or identified. Ask your  health care provider what steps will be taken to help prevent infection. These may include: Removing hair at the surgery site. Washing skin with a germ-killing soap. Antibiotic medicine. Plan to have someone take you home from the hospital or clinic. If you will be going home right after the procedure, plan to have a responsible adult care for you for at least 24 hours after you leave the hospital or clinic. This is important. What happens during the procedure? You may be given one or more of the following: A medicine to help you relax (sedative). A medicine to numb the area (local anesthetic). A small incision will be made at the site of your implanted port. The implanted port and the catheter that has been inside your vein will be gently removed. The port and catheter will be inspected to make sure that all the parts have been removed. Part of the catheter may be tested for bacteria. The incision will be closed with stitches (sutures), adhesive strips, or skin glue. A bandage (dressing) will be placed over the incision. The health care provider may apply gentle pressure over the dressing for about 5 minutes. The procedure may vary among health care providers and hospitals. What happens after the procedure? Your blood pressure, heart rate, breathing rate, and blood oxygen level will be monitored until you leave the hospital or clinic. You will be monitored to make sure that there is no bleeding from the site where the port was removed. Do not drive for 24 hours if you were given a sedative during your procedure. Summary Implanted port removal is a procedure to remove the port and catheter that are implanted under your skin. Before the procedure, follow your health  care provider's instructions about changing or stopping your regular medicines. This is especially important if you are taking diabetes medicines or blood thinners. If you will be going home right after the procedure, plan to have a  responsible adult care for you for at least 24 hours after you leave the hospital or clinic. This information is not intended to replace advice given to you by your health care provider. Make sure you discuss any questions you have with your health care provider. Document Revised: 11/10/2020 Document Reviewed: 10/04/2017 Elsevier Patient Education  2022 [employer].

## 2021-06-17 NOTE — H&P (Signed)
[facility] History and Physical  Reason for Referral: Port removal  Referring Physician: Dr. Delton Coombes   Chief Complaint   New Patient (Initial Visit)     Mark Foster is a 65 y.o. male.  HPI: Mr. Schewe is a 65 yo with a history of pancreatic cancer s/p resection and treatment. I placed his port several years ago and then he developed a DVT and has been on eliquis. He no longer needs to port and is going to come off the eliquis. Dr. Delton Coombes has seen him for port removal. He denies any left arm swelling and is otherwise been doing well.    Past Medical History:  Diagnosis Date   Bilateral carpal tunnel syndrome 10/19/2020   CAD (coronary artery disease)    a. 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   Type 2 diabetes mellitus ([place])     Past Surgical History:  Procedure Laterality Date   APPENDECTOMY     BUBBLE STUDY N/A 11/18/2019   Procedure: BUBBLE STUDY;  Surgeon: Herminio Commons, MD;  [facility];  Service: Cardiology;  Laterality: N/A;   CARDIAC SURGERY     COLON RESECTION     For diverticulitis   COLON SURGERY     CORONARY ARTERY BYPASS GRAFT N/A 01/26/2020   Procedure: CORONARY ARTERY BYPASS GRAFTING (CABG) TIMES FOUR USING LEFT INTERNAL MAMMARY VEIN AND RIGHT GREATER SAPHENOUS VEIN;  Surgeon: Melrose Nakayama, MD;  Location: [place];  Service: Open Heart Surgery;  Laterality: N/A;   ENDOVEIN HARVEST OF GREATER SAPHENOUS VEIN Right 01/26/2020   Procedure: [place] Of Greater Saphenous Vein;  Surgeon: Melrose Nakayama, MD;   Location: [place];  Service: Open Heart Surgery;  Laterality: Right;   ESOPHAGOGASTRODUODENOSCOPY N/A 10/03/2018   Procedure: ESOPHAGOGASTRODUODENOSCOPY (EGD);  Surgeon: [name], MD;  Location: [name];  Service: Endoscopy;  Laterality: N/A;   EUS N/A 10/03/2018   Procedure: UPPER ENDOSCOPIC ULTRASOUND (EUS) RADIAL;  Surgeon: [name], MD;  [facility];  Service: Endoscopy;  Laterality: N/A;   EYE SURGERY Bilateral    FINE NEEDLE ASPIRATION N/A 10/03/2018   Procedure: FINE NEEDLE ASPIRATION (FNA) LINEAR;  Surgeon: [name], MD;  [facility];  Service: Endoscopy;  Laterality: N/A;   IR ANGIO INTRA EXTRACRAN SEL COM CAROTID INNOMINATE BILAT MOD SED  01/21/2018   IR ANGIO INTRA EXTRACRAN SEL COM CAROTID INNOMINATE UNI R MOD SED  03/21/2018   IR ANGIO VERTEBRAL SEL VERTEBRAL BILAT MOD SED  01/21/2018   IR TRANSCATH EXCRAN VERT OR CAR A STENT  03/21/2018   LEFT HEART CATH AND CORONARY ANGIOGRAPHY N/A 01/08/2020   Procedure: LEFT HEART CATH AND CORONARY ANGIOGRAPHY;  Surgeon: Leonie Man, MD;  Location: [facility] [facility];  Service: Cardiovascular;  Laterality: N/A;   PORTACATH PLACEMENT Left 12/23/2018   Procedure: INSERTION PORT-A-CATH;  Surgeon: Virl Cagey, MD;  [facility];  Service: General;  Laterality: Left;   RADIOLOGY WITH ANESTHESIA N/A 03/21/2018   Procedure: IR  WITH ANESTHESIA WITH STENT PLACEMENT;  Surgeon: Luanne Bras, MD;  Location: [place];  Service: Radiology;  Laterality: N/A;   ROTATOR CUFF REPAIR     Left   SPLENECTOMY     TEE WITHOUT CARDIOVERSION N/A 11/18/2019   Procedure: TRANSESOPHAGEAL ECHOCARDIOGRAM (TEE) WITH PROPOFOL;  Surgeon: Herminio Commons, MD;  [facility];  Service: Cardiology;  Laterality: N/A;   TEE WITHOUT CARDIOVERSION N/A 01/26/2020   Procedure: TRANSESOPHAGEAL ECHOCARDIOGRAM (TEE);  Surgeon: Melrose Nakayama, MD;  Location: [place];  Service: Open Heart Surgery;  Laterality: N/A;    TONSILLECTOMY      Family History  Problem Relation Age of Onset   Stroke Mother    Pancreatic cancer Father 82       d. 78   Stroke Maternal Grandmother    Heart attack Maternal Grandfather    Heart Problems Paternal Grandfather    Scoliosis Daughter    Muscular dystrophy Grandson     Social History   Tobacco Use   Smoking status: Former    Types: Cigarettes    Start date: 10/06/2019   Smokeless tobacco: Never  Vaping Use   Vaping Use: Never used  Substance Use Topics   Alcohol use: Yes    Alcohol/week: 1.0 standard drink    Types: 1 Cans of beer per week    Comment: rare    Drug use: Never    Medications: I have reviewed the patient's current medications. Allergies as of 06/14/2021       Reactions   Demerol [meperidine Hcl] Other (See Comments)   convulsions        Medication List        Accurate as of June 14, 2021 10:08 AM. If you have any questions, ask your nurse or doctor.          apixaban 5 MG Tabs tablet Commonly known as: ELIQUIS Take 1 tablet (5 mg total) by mouth 2 (two) times daily.   aspirin 81 MG EC tablet Take 1 tablet (81 mg total) by mouth daily.   atorvastatin 80 MG tablet Commonly known as: LIPITOR Take 1 tablet (80 mg total) by mouth daily at 6 PM.   FreeStyle Libre 2 Reader Kerrin Mo As directed   [employer] 2 Sensor Misc USE AS DIRECTED TO CHECK BLOOD SUGAR, REPLACE EVERY 14 DAYS   FreeStyle Precision Neo Test test strip Generic drug: glucose blood USE TO TEST BLOOD SUGAR 4 TIMES DAILY.   gabapentin 300 MG capsule Commonly known as: NEURONTIN Take 1 capsule (300 mg total) by mouth 3 (three) times daily.   Insulin Pen Needle 32G X 4 MM Misc 1 Device by Does not apply route daily. Use as directed to inject insulin four times daily   levothyroxine 50 MCG tablet Commonly known as: SYNTHROID TAKE 1 TABLET EVERY DAY   lidocaine-prilocaine cream Commonly known as: EMLA Apply 1 application topically daily as needed  (prior to port being accessed (~every 3 months)).   metoprolol tartrate 25 MG tablet Commonly known as: LOPRESSOR Take 1 tablet (25 mg total) by mouth 2 (two) times daily.   nitroGLYCERIN 0.4 MG SL tablet Commonly known as: NITROSTAT Place 1 tablet (0.4 mg total) under the tongue every 5 (five) minutes as needed for chest pain.   NovoLOG FlexPen 100 UNIT/ML FlexPen Generic drug: insulin aspart Inject 14-20 Units into the skin 3 (three) times daily before meals.   Omega-3 1000 MG Caps Take 1,000 mg by mouth daily.   ondansetron  4 MG tablet Commonly known as: ZOFRAN Take by mouth.   sertraline 50 MG tablet Commonly known as: ZOLOFT TAKE 1 TABLET BY MOUTH DAILY   tamsulosin 0.4 MG Caps capsule Commonly known as: FLOMAX Take 1 capsule (0.4 mg total) by mouth daily.   Toujeo Max SoloStar 300 UNIT/ML Solostar Pen Generic drug: insulin glargine (2 Unit Dial) Inject 60 Units into the skin at bedtime.   traMADol 50 MG tablet Commonly known as: ULTRAM Take 50 mg by mouth 4 (four) times daily as needed.   vitamin B-12 1000 MCG tablet Commonly known as: CYANOCOBALAMIN Take 1,000 mcg by mouth daily.         ROS:  A comprehensive review of systems was negative except for: Musculoskeletal: positive for back pain and neck pain Neurological: positive for dizziness and numbness  Blood pressure 118/77, pulse 65, temperature 98.7 F (37.1 C), temperature source Other (Comment), resp. rate 14, height 5\' 10"  (1.778 m), weight 209 lb (94.8 kg), SpO2 95 %. Physical Exam Vitals reviewed.  Constitutional:      Appearance: Normal appearance.  HENT:     Head: Normocephalic.     Nose: Nose normal.     Mouth/Throat:     Mouth: Mucous membranes are moist.  Eyes:     Extraocular Movements: Extraocular movements intact.  Cardiovascular:     Rate and Rhythm: Normal rate and regular rhythm.  Pulmonary:     Effort: Pulmonary effort is normal.     Breath sounds: Normal breath sounds.   Chest:     Comments: Left upper chest, port in place Abdominal:     General: There is no distension.     Palpations: Abdomen is soft.     Tenderness: There is no abdominal tenderness.  Musculoskeletal:        General: Normal range of motion.  Skin:    General: Skin is warm.  Neurological:     General: No focal deficit present.     Mental Status: He is alert and oriented to person, place, and time.  Psychiatric:        Mood and Affect: Mood normal.        Behavior: Behavior normal.        Thought Content: Thought content normal.    Results: None   Assessment & Plan:  Mark Foster is a 65 y.o. male with a port in place that needs to be removed. He had a prior DVT and is off the eliquis now.  -Discussed excision and risk of bleeding, infection, incomplete removal, and post removal CXR.    All questions were answered to the satisfaction of the patient and family.    Virl Cagey 06/14/2021, 10:08 AM

## 2021-06-20 ENCOUNTER — Encounter (HOSPITAL_COMMUNITY)
Admission: RE | Disposition: A | Discharge status: Home / Self Care | Payer: Self-pay | Source: Home / Self Care | Attending: General Surgery

## 2021-06-20 ENCOUNTER — Encounter (HOSPITAL_COMMUNITY): Payer: Self-pay | Admitting: General Surgery

## 2021-06-20 ENCOUNTER — Ambulatory Visit (HOSPITAL_COMMUNITY)
Admission: RE | Admit: 2021-06-20 | Discharge: 2021-06-20 | Disposition: A | Discharge status: Home / Self Care | Payer: HMO | Attending: General Surgery | Admitting: General Surgery

## 2021-06-20 ENCOUNTER — Ambulatory Visit (HOSPITAL_COMMUNITY): Payer: HMO

## 2021-06-20 DIAGNOSIS — Z951 Presence of aortocoronary bypass graft: Secondary | ICD-10-CM | POA: Diagnosis not present

## 2021-06-20 DIAGNOSIS — Z8673 Personal history of transient ischemic attack (TIA), and cerebral infarction without residual deficits: Secondary | ICD-10-CM | POA: Insufficient documentation

## 2021-06-20 DIAGNOSIS — Z87891 Personal history of nicotine dependence: Secondary | ICD-10-CM | POA: Insufficient documentation

## 2021-06-20 DIAGNOSIS — Z86718 Personal history of other venous thrombosis and embolism: Secondary | ICD-10-CM | POA: Insufficient documentation

## 2021-06-20 DIAGNOSIS — Z452 Encounter for adjustment and management of vascular access device: Secondary | ICD-10-CM | POA: Diagnosis not present

## 2021-06-20 DIAGNOSIS — Z7982 Long term (current) use of aspirin: Secondary | ICD-10-CM | POA: Diagnosis not present

## 2021-06-20 DIAGNOSIS — Z79899 Other long term (current) drug therapy: Secondary | ICD-10-CM | POA: Diagnosis not present

## 2021-06-20 DIAGNOSIS — Z885 Allergy status to narcotic agent status: Secondary | ICD-10-CM | POA: Diagnosis not present

## 2021-06-20 DIAGNOSIS — Z794 Long term (current) use of insulin: Secondary | ICD-10-CM | POA: Diagnosis not present

## 2021-06-20 DIAGNOSIS — Z7901 Long term (current) use of anticoagulants: Secondary | ICD-10-CM | POA: Diagnosis not present

## 2021-06-20 DIAGNOSIS — Z79890 Hormone replacement therapy (postmenopausal): Secondary | ICD-10-CM | POA: Diagnosis not present

## 2021-06-20 DIAGNOSIS — Z8507 Personal history of malignant neoplasm of pancreas: Secondary | ICD-10-CM | POA: Insufficient documentation

## 2021-06-20 DIAGNOSIS — Z90411 Acquired partial absence of pancreas: Secondary | ICD-10-CM | POA: Insufficient documentation

## 2021-06-20 DIAGNOSIS — Z95828 Presence of other vascular implants and grafts: Secondary | ICD-10-CM

## 2021-06-20 DIAGNOSIS — Z9889 Other specified postprocedural states: Secondary | ICD-10-CM

## 2021-06-20 HISTORY — PX: PORT-A-CATH REMOVAL: SHX5289

## 2021-06-20 SURGERY — MINOR REMOVAL PORT-A-CATH
Anesthesia: LOCAL

## 2021-06-20 MED ORDER — LIDOCAINE HCL (PF) 1 % IJ SOLN
INTRAMUSCULAR | Status: AC
  Filled 2021-06-20: qty 30

## 2021-06-20 MED ORDER — LIDOCAINE HCL (PF) 1 % IJ SOLN
INTRAMUSCULAR | Status: DC | PRN
  Administered 2021-06-20: 16 mL

## 2021-06-20 MED ORDER — HYDROCODONE-ACETAMINOPHEN 5-325 MG PO TABS: 1.0000 | ORAL_TABLET | ORAL | 0 refills | Status: DC | PRN

## 2021-06-20 SURGICAL SUPPLY — 14 items
APPLICATOR CHLORAPREP 10.5 ORG (MISCELLANEOUS) IMPLANT
CLOTH BEACON ORANGE TIMEOUT ST (SAFETY) ×2 IMPLANT
DECANTER SPIKE VIAL GLASS SM (MISCELLANEOUS) ×2 IMPLANT
DERMABOND ADVANCED (GAUZE/BANDAGES/DRESSINGS) ×1
DERMABOND ADVANCED .7 DNX12 (GAUZE/BANDAGES/DRESSINGS) ×1 IMPLANT
DRAPE HALF SHEET 40X57 (DRAPES) IMPLANT
GLOVE SURG ENC MOIS LTX SZ6.5 (GLOVE) IMPLANT
GLOVE SURG UNDER POLY LF SZ6.5 (GLOVE) IMPLANT
GLOVE SURG UNDER POLY LF SZ7 (GLOVE) IMPLANT
GOWN STRL REUS W/TWL LRG LVL3 (GOWN DISPOSABLE) IMPLANT
SPONGE GAUZE 2X2 8PLY STRL LF (GAUZE/BANDAGES/DRESSINGS) ×2 IMPLANT
SUT MNCRL AB 4-0 PS2 18 (SUTURE) ×2 IMPLANT
SUT VIC AB 3-0 SH 27 (SUTURE) ×1
SUT VIC AB 3-0 SH 27X BRD (SUTURE) ×1 IMPLANT

## 2021-06-20 NOTE — Discharge Instructions (Signed)
Keep area clean and dry. You can take a shower in 24 hours. Do not submerge in water.  Take tylenol and ibuprofen for pain control and Norco for severe pain. You no longer have to be on Eliquis per Dr. Tomie China notes.

## 2021-06-20 NOTE — Interval H&P Note (Signed)
History and Physical Interval Note:  06/20/2021 8:39 AM  Mark Foster  has presented today for surgery, with the diagnosis of Port in place.  The various methods of treatment have been discussed with the patient and family. After consideration of risks, benefits and other options for treatment, the patient has consented to  Procedure(s): MINOR REMOVAL PORT-A-CATH (N/A) as a surgical intervention.  The patient's history has been reviewed, patient examined, no change in status, stable for surgery.  I have reviewed the patient's chart and labs.  Questions were answered to the patient's satisfaction.     Virl Cagey

## 2021-06-20 NOTE — Op Note (Signed)
[facility] Procedure Note  06/20/21  Pre procedure Diagnosis: Port in place    Post Procedure Diagnosis: Same   Procedure(s) Performed: Port removal, left subclavian    Surgeon: Lanell Matar. Constance Haw, MD   Assistants: No qualified resident was available    Anesthesia: Bupivacaine    Specimens: None   Estimated Blood Loss: Minimal   Blood Replacement: None    Complications: None   Wound Class: Clean    Operative Indications:  Mark Foster is a 65 yo with a port in place who has had a DVT in the past in that arm, now off his treatment and Eliquis. He wants the port removed. Discussed bleeding, infection, incomplete removal.  Findings: Port with some scarring at clavicle    Procedure: The patient was taken to the procedure room and placed supine. The left chest was prepared and draped in the usual sterile fashion.   Bupivacaine was injected at the port incision site and around the catheter. An incision was made and carried down to the capsule. The capsule was opened and the port was freed from the capsule. The catheter was freed from the capsule using sharp dissection to the clavicle with scissors. I could not get the catheter to freely pull out, so a counter incision was made at the clavicle where the catheter entered the subclavian. Using sharp dissection the catheter was freed from the scar and the catheter and port were removed in the entirety.  Pressure was held by my assistant for 10 minutes. Cautery was used for hemostasis and to score the capsule.   The incision sites were closed with deep 3-0 interrupted Vicryl and the skin was closed with 4-0 Monocryl subcuticular and dermabond.    Final inspection revealed acceptable hemostasis. All counts were correct at the end of the case. A CXR was done to confirm complete removal.    [name], MD [facility] [address] [place], [place] [address] [phone] (office)

## 2021-06-21 ENCOUNTER — Encounter (HOSPITAL_COMMUNITY): Payer: Self-pay | Admitting: General Surgery

## 2021-07-05 ENCOUNTER — Ambulatory Visit (HOSPITAL_COMMUNITY)
Admission: RE | Admit: 2021-07-05 | Discharge: 2021-07-05 | Disposition: A | Discharge status: Home / Self Care | Payer: HMO | Source: Ambulatory Visit | Attending: Interventional Radiology | Admitting: Interventional Radiology

## 2021-07-05 ENCOUNTER — Other Ambulatory Visit: Payer: Self-pay

## 2021-07-05 ENCOUNTER — Encounter (HOSPITAL_COMMUNITY): Payer: Self-pay

## 2021-07-05 DIAGNOSIS — I771 Stricture of artery: Secondary | ICD-10-CM | POA: Diagnosis not present

## 2021-07-05 DIAGNOSIS — Z8673 Personal history of transient ischemic attack (TIA), and cerebral infarction without residual deficits: Secondary | ICD-10-CM | POA: Diagnosis not present

## 2021-07-05 DIAGNOSIS — I6623 Occlusion and stenosis of bilateral posterior cerebral arteries: Secondary | ICD-10-CM | POA: Diagnosis not present

## 2021-07-05 DIAGNOSIS — I6601 Occlusion and stenosis of right middle cerebral artery: Secondary | ICD-10-CM | POA: Diagnosis not present

## 2021-07-05 DIAGNOSIS — I6523 Occlusion and stenosis of bilateral carotid arteries: Secondary | ICD-10-CM | POA: Diagnosis not present

## 2021-07-05 LAB — POCT I-STAT CREATININE: Creatinine, Ser: 0.8 mg/dL (ref 0.61–1.24)

## 2021-07-05 MED ORDER — IOHEXOL 350 MG/ML SOLN
75.0000 mL | Freq: Once | INTRAVENOUS | Status: AC | PRN
  Administered 2021-07-05: 75 mL via INTRAVENOUS

## 2021-07-06 DIAGNOSIS — Z7901 Long term (current) use of anticoagulants: Secondary | ICD-10-CM | POA: Diagnosis not present

## 2021-07-06 DIAGNOSIS — N400 Enlarged prostate without lower urinary tract symptoms: Secondary | ICD-10-CM | POA: Diagnosis not present

## 2021-07-06 DIAGNOSIS — M4802 Spinal stenosis, cervical region: Secondary | ICD-10-CM | POA: Diagnosis not present

## 2021-07-06 DIAGNOSIS — E782 Mixed hyperlipidemia: Secondary | ICD-10-CM | POA: Diagnosis not present

## 2021-07-06 DIAGNOSIS — M545 Low back pain, unspecified: Secondary | ICD-10-CM | POA: Diagnosis not present

## 2021-07-06 DIAGNOSIS — E119 Type 2 diabetes mellitus without complications: Secondary | ICD-10-CM | POA: Diagnosis not present

## 2021-07-06 DIAGNOSIS — K219 Gastro-esophageal reflux disease without esophagitis: Secondary | ICD-10-CM | POA: Diagnosis not present

## 2021-07-06 DIAGNOSIS — E039 Hypothyroidism, unspecified: Secondary | ICD-10-CM | POA: Diagnosis not present

## 2021-07-06 DIAGNOSIS — G40309 Generalized idiopathic epilepsy and epileptic syndromes, not intractable, without status epilepticus: Secondary | ICD-10-CM | POA: Diagnosis not present

## 2021-07-06 DIAGNOSIS — Z01818 Encounter for other preprocedural examination: Secondary | ICD-10-CM | POA: Diagnosis not present

## 2021-07-06 DIAGNOSIS — I10 Essential (primary) hypertension: Secondary | ICD-10-CM | POA: Diagnosis not present

## 2021-07-06 DIAGNOSIS — I2510 Atherosclerotic heart disease of native coronary artery without angina pectoris: Secondary | ICD-10-CM | POA: Diagnosis not present

## 2021-07-11 ENCOUNTER — Telehealth (HOSPITAL_COMMUNITY): Payer: Self-pay

## 2021-07-11 NOTE — Telephone Encounter (Signed)
Pt agreed to f/u in 6 months with cta head/neck. AW 

## 2021-08-01 ENCOUNTER — Other Ambulatory Visit: Payer: Self-pay | Admitting: "Endocrinology

## 2021-08-01 DIAGNOSIS — E1165 Type 2 diabetes mellitus with hyperglycemia: Secondary | ICD-10-CM

## 2021-08-12 ENCOUNTER — Ambulatory Visit: Payer: HMO | Admitting: "Endocrinology

## 2021-08-22 ENCOUNTER — Other Ambulatory Visit: Payer: Self-pay | Admitting: "Endocrinology

## 2021-09-02 DIAGNOSIS — I10 Essential (primary) hypertension: Secondary | ICD-10-CM | POA: Diagnosis not present

## 2021-09-02 DIAGNOSIS — E782 Mixed hyperlipidemia: Secondary | ICD-10-CM | POA: Diagnosis not present

## 2021-09-27 DIAGNOSIS — I10 Essential (primary) hypertension: Secondary | ICD-10-CM | POA: Diagnosis not present

## 2021-09-27 DIAGNOSIS — E039 Hypothyroidism, unspecified: Secondary | ICD-10-CM | POA: Diagnosis not present

## 2021-09-27 DIAGNOSIS — E782 Mixed hyperlipidemia: Secondary | ICD-10-CM | POA: Diagnosis not present

## 2021-09-27 DIAGNOSIS — E119 Type 2 diabetes mellitus without complications: Secondary | ICD-10-CM | POA: Diagnosis not present

## 2021-09-27 DIAGNOSIS — I2510 Atherosclerotic heart disease of native coronary artery without angina pectoris: Secondary | ICD-10-CM | POA: Diagnosis not present

## 2021-10-02 DIAGNOSIS — I10 Essential (primary) hypertension: Secondary | ICD-10-CM | POA: Diagnosis not present

## 2021-10-02 DIAGNOSIS — E782 Mixed hyperlipidemia: Secondary | ICD-10-CM | POA: Diagnosis not present

## 2021-10-04 DIAGNOSIS — G40309 Generalized idiopathic epilepsy and epileptic syndromes, not intractable, without status epilepticus: Secondary | ICD-10-CM | POA: Diagnosis not present

## 2021-10-04 DIAGNOSIS — Z7901 Long term (current) use of anticoagulants: Secondary | ICD-10-CM | POA: Diagnosis not present

## 2021-10-04 DIAGNOSIS — M545 Low back pain, unspecified: Secondary | ICD-10-CM | POA: Diagnosis not present

## 2021-10-04 DIAGNOSIS — E039 Hypothyroidism, unspecified: Secondary | ICD-10-CM | POA: Diagnosis not present

## 2021-10-04 DIAGNOSIS — Z01818 Encounter for other preprocedural examination: Secondary | ICD-10-CM | POA: Diagnosis not present

## 2021-10-04 DIAGNOSIS — N400 Enlarged prostate without lower urinary tract symptoms: Secondary | ICD-10-CM | POA: Diagnosis not present

## 2021-10-04 DIAGNOSIS — I10 Essential (primary) hypertension: Secondary | ICD-10-CM | POA: Diagnosis not present

## 2021-10-04 DIAGNOSIS — K219 Gastro-esophageal reflux disease without esophagitis: Secondary | ICD-10-CM | POA: Diagnosis not present

## 2021-10-04 DIAGNOSIS — E782 Mixed hyperlipidemia: Secondary | ICD-10-CM | POA: Diagnosis not present

## 2021-10-04 DIAGNOSIS — M4802 Spinal stenosis, cervical region: Secondary | ICD-10-CM | POA: Diagnosis not present

## 2021-10-04 DIAGNOSIS — I2510 Atherosclerotic heart disease of native coronary artery without angina pectoris: Secondary | ICD-10-CM | POA: Diagnosis not present

## 2021-10-04 DIAGNOSIS — E119 Type 2 diabetes mellitus without complications: Secondary | ICD-10-CM | POA: Diagnosis not present

## 2021-10-06 ENCOUNTER — Ambulatory Visit: Payer: HMO | Admitting: Adult Health

## 2021-10-06 ENCOUNTER — Other Ambulatory Visit: Payer: Self-pay

## 2021-10-06 ENCOUNTER — Encounter: Payer: Self-pay | Admitting: Adult Health

## 2021-10-06 VITALS — BP 120/69 | HR 73 | Ht 70.0 in | Wt 214.0 lb

## 2021-10-06 DIAGNOSIS — M4802 Spinal stenosis, cervical region: Secondary | ICD-10-CM

## 2021-10-06 DIAGNOSIS — G629 Polyneuropathy, unspecified: Secondary | ICD-10-CM | POA: Diagnosis not present

## 2021-10-06 DIAGNOSIS — Z8673 Personal history of transient ischemic attack (TIA), and cerebral infarction without residual deficits: Secondary | ICD-10-CM

## 2021-10-06 MED ORDER — GABAPENTIN 300 MG PO CAPS: 300.0000 mg | ORAL_CAPSULE | Freq: Three times a day (TID) | ORAL | 3 refills | Status: DC

## 2021-10-06 NOTE — Patient Instructions (Signed)
Ensure you follow up with [facility] regarding neck issues - high suspicion this is largely contributing to your balance and gait issues   Continue aspirin 81 mg daily  and atorvastatin  for secondary stroke prevention  Continue to follow up with PCP regarding cholesterol, diabetes and blood pressure management  Maintain strict control of hypertension with blood pressure goal below 130/90, diabetes with hemoglobin A1c goal below 7.0 % and cholesterol with LDL cholesterol (bad cholesterol) goal below 70 mg/dL.   Signs of a Stroke? Follow the BEFAST method:  Balance Watch for a sudden loss of balance, trouble with coordination or vertigo Eyes Is there a sudden loss of vision in one or both eyes? Or double vision?  Face: Ask the person to smile. Does one side of the face droop or is it numb?  Arms: Ask the person to raise both arms. Does one arm drift downward? Is there weakness or numbness of a leg? Speech: Ask the person to repeat a simple phrase. Does the speech sound slurred/strange? Is the person confused ? Time: If you observe any of these signs, call 911.        Thank you for coming to see [place] at [facility]. I hope we have been able to provide you high quality care today.  You may receive a patient satisfaction survey over the next few weeks. We would appreciate your feedback and comments so that we may continue to improve ourselves and the health of our patients.

## 2021-10-06 NOTE — Progress Notes (Signed)
[facility] [address] [place]. [place] [address] [phone]       OFFICE FOLLOW UP NOTE  Mr. Mark Foster Date of Birth:  1956/06/26 Medical Record Number:  [id number]   Referring MD: Minus Breeding Reason for Referral: Stroke   Chief complaint: Chief Complaint  Patient presents with   Follow-up    RM 2 with spouse [name]   Pt is well, states he has been having trouble with balance again but no other concerns        HPI:   Update 10/06/2021 JM: Returns for 21-month follow-up accompanied by his accompanied by his wife. Main complaint today is in regards to worsening gait and balance over the past 2 months. Balance can worsen if standing too long. He has also been experiencing increased neck pain with limited ROM.  Followed by Dr. Lorin Mercy with prior visit in 04/2021 who mentioned changes in gait at that time. Surgery on hold due to elevated A1c with recent A1c 9.1 (per pt, unable to view via epic).  Recently received referral from PCP for second opinion through [facility] - awaiting to schedule consult. Denies any new weakness, numbness/tingling, vision changes or any other stroke/TIA symptoms. Neuropathy has been stable without worsening. Continues on gabapentin, denies side effects.  Previously on Keppra for possible seizures with presyncopal episodes but at some point has discontinued, unsure when or reason. No recent events.  Compliant on aspirin and atorvastatin without side effects.  Blood pressure today 120/69.  No further concerns at this time.    History provided for reference purposes only Update 03/30/2021 JM: Mr. Eckley returns for 8-month stroke follow-up accompanied by his wife.  Stable from stroke standpoint without new stroke/TIA symptoms.  Compliant on Plavix and atorvastatin for secondary stroke prevention as well as aspirin s/p CABG and Eliquis for LUE DVT without associated side effects.  Blood pressure today 93/56.  Glucose levels stable.  Remains on Keppra  XR 750 mg nightly tolerating without side effects and no additional seizure activity or syncopal events.  He did undergo EMG/NCV in 11/2020 which showed bilateral carpal tunnel syndrome as well as chronic C7 radiculopathy and referred to orthopedics.  He does report continued hand pain with intermittent pain -on gabapentin 300 mg 3 times daily tolerating without side effects.  He has not yet had follow-up with orthopedics sent cervical imaging completed.  No further concerns at this time.  Update 09/29/2020 JM: Mr. Cleavenger returns for 78-month stroke follow-up. He reports he has done well from a stroke standpoint without new stroke/TIA symptoms.  He does report over the past 3 months progressive L>R hand numbness and left forearm, wrist and hand cramping especially when attempting to play his guitar.  He does have known neuropathy previously BLE distally on gabapentin as well as prior mention of BUE numbness/tingling but reports progressive worsening over the past 3 months interfering with activity.  He has remained on Keppra XR 500 mg daily tolerating without side effects but wife does report presyncopal type event 1 to 2 weeks ago and yesterday felt generalized weakness, fatigue and "seemed out of it".  This did not progress to presyncopal or syncopal event or loss of consciousness.  Similar events to prior episodes for which he was started on Keppra.  He denies missing any Keppra dosages or recent medication changes.  He has remained on Eliquis for LUE DVT currently managed by oncology with plans on continuing Eliquis until port removal. Remains on aspirin, atorvastatin and Zetia for  secondary stroke prevention without side effects. Prior lipid panel (05/2020) showed LDL 44. Blood pressure today 96/60. Glucose levels improving per report. Recent A1c 8.9 down from 11.3. Routinely follows with endocrinology.  No further concerns at this time.  Update 03/29/2020 JM: Mr. Dalziel returns for follow-up with prior history  of stroke and transient LOC episodes possibly syncope vs seizure.  Stable from a neurological standpoint since prior visit without recurrent episodes and remains on Keppra XR 500 mg daily tolerating well without side effects.  EEG completed which was normal.  Since prior visit, underwent CABGx4 and 01/2020 with a left subclavian DVT shortly after discharge and initiation of Eliquis currently managed by cardiology.  Previously on Plavix for stroke prevention and initiation of aspirin by cardiology post CABG procedure.  Remains on Eliquis, Plavix and aspirin without bleeding or bruising.  Continues on atorvastatin 80 mg daily and Zetia 10 mg daily without myalgias.  Blood pressure today 140/66.  Reports glucose levels stable.  DM managed by endocrinology.  Follows closely with cardiology, cardiothoracic and PCP.  No concerns at this time.  Update 12/11/2019 Dr. Leonie Man: Mr. Pottinger is a 66 year old Caucasian male with past medical history of diabetes, vitamin D deficiency, pancreatic cancer s/p chemotherapy, hypothyroidism, diabetic neuropathy stroke in 2011 and hypertension who presented to [facility] on 10/09/2019 with episode of brief unresponsiveness.  Patient states that he had gone to a musical open mic outdoor event with a friend.  He remembers keeping his guitar down and going to the bar and getting a beer for himself and his friend.  He lost consciousness and the next thing he remembers was waking up in the ambulance.  Apparently there was no witnessed tonic-clonic activity tongue bite.  The patient was little confused and had some slurred speech when he woke up.  The symptoms resolved by the time he reached the hospital.  He had an MRI scan of the brain done on 10/10/2019 which I personally reviewed shows tiny enhancing areas of enhancement in the right temporal lobe likely subacute infarcts.  Metastasis would be less likely.  He does have a previous history of cryptogenic stroke in the left  MCA for several years ago.  He was in fact followed in office and had a strokelike episode on 01/19/2018 which was treated with IV TPA and showed clinical improvement at that time the MRI had shown remote age left right MCA infarct which patient was not aware of.  He had stent assisted angioplasty of left vertebral artery stenosis on 03/21/2018 by Dr. Estanislado Pandy.  He has no prior history so history of seizures in the form of loss of consciousness, tongue biting or injury.  He denies any headache, slurred speech, gait or balance problems.  ROS:   14 system review of systems is positive for see HPI and all other systems negative   PMH:  Past Medical History:  Diagnosis Date   Bilateral carpal tunnel syndrome 10/19/2020   CAD (coronary artery disease)    a. 01/2020: CABG x4 with LIMA-LAD, SVG-OM, SVG-PDA and SVG-D1   Carotid artery obstruction, left    Left ICA occlusion   Cervical compression fracture (HCC)    Cervical spinal stenosis    Diabetic neuropathy (HCC)    Bilateral legs   Diverticulitis    Family history of pancreatic cancer    History of kidney stones    Hypothyroidism    Pancreatic cancer ([place])    Stenosis of left vertebral artery  Stroke (cerebrum) ([place]) 10/09/2019   Type 2 diabetes mellitus (HCC)     Social History:  Social History   Socioeconomic History   Marital status: Married    Spouse name: Not on file   Number of children: 2   Years of education: Not on file   Highest education level: Not on file  Occupational History   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  Tobacco Use   Smoking status: Former    Types: Cigarettes    Start date: 10/06/2019   Smokeless tobacco: Never  Vaping Use   Vaping Use: Never used  Substance and Sexual Activity   Alcohol use: Yes    Alcohol/week: 1.0 standard drink    Types: 1 Cans of beer per week    Comment: rare    Drug use: Never   Sexual activity: Not on file  Other Topics Concern   Not on file  Social History Narrative   Not  on file   Social Determinants of Health   Financial Resource Strain: Not on file  Food Insecurity: Not on file  Transportation Needs: Not on file  Physical Activity: Not on file  Stress: Not on file  Social Connections: Not on file  Intimate Partner Violence: Not on file    Medications:   Current Outpatient Medications on File Prior to Visit  Medication Sig Dispense Refill   aspirin EC 81 MG EC tablet Take 1 tablet (81 mg total) by mouth daily.     atorvastatin (LIPITOR) 80 MG tablet Take 1 tablet (80 mg total) by mouth daily at 6 PM. 90 tablet 1   Continuous Blood Gluc Receiver (FREESTYLE LIBRE 2 READER) DEVI As directed 1 each 0   Continuous Blood Gluc Sensor (FREESTYLE LIBRE 2 SENSOR) MISC USE AS DIRECTED TO CHECK BLOOD SUGAR, REPLACE EVERY 14 DAYS 2 each 1   FREESTYLE PRECISION NEO TEST test strip USE TO TEST BLOOD SUGAR 4 TIMES DAILY. 100 strip 2   gabapentin (NEURONTIN) 300 MG capsule Take 1 capsule (300 mg total) by mouth 3 (three) times daily. 270 capsule 3   HYDROcodone-acetaminophen (NORCO) 5-325 MG tablet Take 1 tablet by mouth every 4 (four) hours as needed for severe pain. 5 tablet 0   insulin aspart (NOVOLOG FLEXPEN) 100 UNIT/ML FlexPen Inject 14-20 Units into the skin 3 (three) times daily before meals. (Patient taking differently: Inject 16-20 Units into the skin 3 (three) times daily before meals.) 30 mL 1   Insulin Pen Needle 32G X 4 MM MISC 1 Device by Does not apply route daily. Use as directed to inject insulin four times daily 200 each 2   levothyroxine (SYNTHROID) 50 MCG tablet TAKE 1 TABLET EVERY DAY 90 tablet 1   metoprolol tartrate (LOPRESSOR) 25 MG tablet Take 25 mg by mouth 2 (two) times daily.     nitroGLYCERIN (NITROSTAT) 0.4 MG SL tablet Place 1 tablet (0.4 mg total) under the tongue every 5 (five) minutes as needed for chest pain. 100 tablet 3   Omega-3 1000 MG CAPS Take 1,000 mg by mouth daily.     sertraline (ZOLOFT) 50 MG tablet TAKE 1 TABLET BY MOUTH  DAILY (Patient taking differently: Take 50 mg by mouth daily.) 90 tablet 1   tamsulosin (FLOMAX) 0.4 MG CAPS capsule Take 1 capsule (0.4 mg total) by mouth daily. (Patient taking differently: Take 0.4 mg by mouth at bedtime.) 90 capsule 3   TOUJEO MAX SOLOSTAR 300 UNIT/ML Solostar Pen INJECT 60 UNITS INTO THE SKIN AT BEDTIME. 6  mL 0   traMADol (ULTRAM) 50 MG tablet Take 50 mg by mouth 4 (four) times daily as needed for severe pain.     vitamin B-12 (CYANOCOBALAMIN) 1000 MCG tablet Take 1,000 mcg by mouth daily.     Current Facility-Administered Medications on File Prior to Visit  Medication Dose Route Frequency Provider Last Rate Last Admin   0.9 %  sodium chloride infusion   Intravenous Continuous Derek Jack, MD 20 mL/hr at 12/30/18 1351 New Bag at 12/30/18 1351   0.9 %  sodium chloride infusion   Intravenous Continuous Derek Jack, MD 20 mL/hr at 12/30/18 1401 New Bag at 12/30/18 1401   dextrose 5 % solution   Intravenous Once Derek Jack, MD       heparin lock flush 100 unit/mL  500 Units Intracatheter Once PRN Derek Jack, MD       sodium chloride flush (NS) 0.9 % injection 10 mL  10 mL Intracatheter PRN Derek Jack, MD   10 mL at 12/30/18 0911   sodium chloride flush (NS) 0.9 % injection 10 mL  10 mL Intracatheter PRN Derek Jack, MD   10 mL at 05/21/19 0845    Allergies:   Allergies  Allergen Reactions   Demerol [Meperidine Hcl] Other (See Comments)    convulsions    Today's Vitals   10/06/21 1422  BP: 120/69  Pulse: 73  Weight: 214 lb (97.1 kg)  Height: 5\' 10"  (1.778 m)    Body mass index is 30.71 kg/m.  Physical Exam General: Pleasant middle-age Caucasian male seated, in no evident distress Head: head normocephalic and atraumatic.   Neck: supple with no carotid or supraclavicular bruits Cardiovascular: regular rate and rhythm, no murmurs Musculoskeletal: no deformity Skin:  no rash/petichiae Vascular:  Normal  pulses all extremities  Neurologic Exam Mental Status: Awake and fully alert.  Fluent speech and language although occasional hesitation.  Oriented to place and time. Recent and remote memory intact. Attention span, concentration and fund of knowledge appropriate. Mood and affect appropriate.  Cranial Nerves: Pupils equal, briskly reactive to light. Extraocular movements full without nystagmus. Visual fields full to confrontation. Hearing intact. Facial sensation intact. Face, tongue, palate moves normally and symmetrically.  Motor: Normal bulk and tone. Normal strength in all tested extremity muscles. Sensory.: intact to touch but diminished position, pinprick and vibratory sensation in sock distribution bilaterally Coordination: Rapid alternating movements normal in all extremities. Finger-to-nose and heel-to-shin performed accurately bilaterally. Gait and Station: Arises from chair without difficulty. Stance is broad-based gait demonstrates mild ataxia and broad-based gait with mild imbalance.  Difficulty performing tandem walk.   Reflexes: 1+ and symmetric in upper extremities and depressed in lower extremity.. Toes downgoing.      ASSESSMENT: 66 year old Caucasian male with history significant for episode of brief loss of consciousness in 12/2019 possible prolonged syncope versus unwitnessed seizure with initiation of Keppra and reoccurrence 09/2020 with 2 additional presyncopal type events therefore keppra dosage increased without any additional events, and subacute embolic right temporal infarcts of cryptogenic etiology potentially related to hypercoagulability from pancreatic cancer.  Vascular risk factors of strokelike episode s/p TPA 01/2018, chronic left MCA stroke, hypertension, diabetes, hyperlipidemia,  pancreatic cancer s/p chemo (completed 09/2019), BLE neuropathy, vertebral artery stenosis s/p angioplasty stenting 03/2018 and left ICA occlusion.  S/p CABGx4 on 01/26/2020 with DVT left  subclavian treated with Eliqius.    PLAN:  Worsening gait/balance High suspicion in setting of progressive cervical stenosis as well as prior stroke and neuropathy Plans on  second opinion through [facility] for surgical options Followed by Dr. Lorin Mercy orthopedics who recommended holding on surgery due to elevated A1c No indication for imaging or further work up from stroke standpoint - no other stroke symptoms and exam unremarkable. Will proceed with brain imaging if symptoms worsen  History of multiple strokes:  Continue aspirin 81mg  daily and atorvastatin 80 mg daily\ for secondary stroke prevention 30-day cardiac event monitor negative for atrial fibrillation Close PCP, cardiology and endocrinology f/u for aggressive stroke risk factor management including HTN, HLD and DM  Syncope vs seizure:  No additional events since prior visit  At some point, keppra stopped for unknown reason No additional events over the past year - can remain of keppra at this time but low threshold for restarting  Neuropathy history of uncontrolled DM, s/p chemo and radiation, B12 deficiency on supplementation, and chronic cervical stenosis EMG/NCV 10/19/2020 bilateral CTS of moderate severity, chronic stable denervation of left tricep muscle potentially associate with chronic C7 radiculopathy Continue gabapentin 300 mg 3 times daily - refill provided   L VA stenosis s/p stenting and left ICA occlusion:  Routinely followed by Dr. Estanislado Pandy - repeat imaging 07/2021 stable. Plans on 6 mo f/u imaging      Follow-up in 1 year or call earlier if needed    CC:  Celene Squibb, MD    I spent 38 minutes of face-to-face and non-face-to-face time with patient and wife.  This included previsit chart review, lab review, study review, order entry, electronic health record documentation, patient education and discussion regarding prior strokes, worsening balance/gait and possible etiologies, prior syncopal versus seizure  type event, continued neuropathy and medication usage, importance of managing stroke risk factors and answering all other questions to patient and wife's satisfaction   Frann Rider, AGNP-BC  [facility] [address] [place], [place] [address]  Phone [phone] Fax [phone] Note: This document was prepared with digital dictation and possible smart phrase technology. Any transcriptional errors that result from this process are unintentional.

## 2021-10-26 ENCOUNTER — Ambulatory Visit: Payer: HMO | Admitting: Cardiology

## 2021-10-31 ENCOUNTER — Other Ambulatory Visit: Payer: Self-pay

## 2021-10-31 ENCOUNTER — Ambulatory Visit: Payer: HMO | Admitting: Cardiology

## 2021-10-31 ENCOUNTER — Encounter: Payer: Self-pay | Admitting: Cardiology

## 2021-10-31 VITALS — BP 140/80 | HR 86 | Ht 70.0 in | Wt 213.0 lb

## 2021-10-31 DIAGNOSIS — I25119 Atherosclerotic heart disease of native coronary artery with unspecified angina pectoris: Secondary | ICD-10-CM

## 2021-10-31 DIAGNOSIS — E782 Mixed hyperlipidemia: Secondary | ICD-10-CM

## 2021-10-31 MED ORDER — NITROGLYCERIN 0.4 MG SL SUBL: 0.4000 mg | SUBLINGUAL_TABLET | SUBLINGUAL | 3 refills | Status: AC | PRN

## 2021-10-31 NOTE — Patient Instructions (Signed)
Medication Instructions:  Continue all current medications.   Labwork: none  Testing/Procedures: none  Follow-Up: 6 months   Any Other Special Instructions Will Be Listed Below (If Applicable).   If you need a refill on your cardiac medications before your next appointment, please call your pharmacy.  

## 2021-10-31 NOTE — Progress Notes (Signed)
Cardiology Office Note  Date: 10/31/2021   ID: Yurem, Viner Apr 29, 1956, MRN [id number]  PCP:  Celene Squibb, MD  Cardiologist:  Rozann Lesches, MD Electrophysiologist:  None   Chief Complaint  Patient presents with   Cardiac follow-up    History of Present Illness: ALECXANDER MAINWARING is a 66 y.o. male last seen in August 2022.  He is here today with his wife for a follow-up visit.  He does not report any progressive angina symptoms, only rare nitroglycerin use.  Does complain of fatigue with exertion, also some unsteadiness.  He is pending evaluation at [facility] for second opinion regarding management of his cervical spinal stenosis. He had been evaluated by Dr. Lorin Mercy previously.  RCRI perioperative cardiac risk was relatively high range based on substrate, class IV with 11% chance of major adverse cardiac event.  I went over his medications.  He is no longer on anticoagulation with prior history of left upper extremity DVT, Port-A-Cath has been removed.  He is still following with Dr. Delton Coombes with history of pancreatic cancer.  I did talk with him about setting up follow-up cardiac structural and ischemic testing due to his fatigue particularly if he is excepted for spinal surgery on evaluation at [facility].  Past Medical History:  Diagnosis Date   Bilateral carpal tunnel syndrome 10/19/2020   CAD (coronary artery disease)    a. 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   Type 2 diabetes mellitus ([place])     Past Surgical History:  Procedure Laterality Date   APPENDECTOMY     BUBBLE STUDY  N/A 11/18/2019   Procedure: BUBBLE STUDY;  Surgeon: Herminio Commons, MD;  [facility];  Service: Cardiology;  Laterality: N/A;   CARDIAC SURGERY     COLON RESECTION     For diverticulitis   COLON SURGERY     CORONARY ARTERY BYPASS GRAFT N/A 01/26/2020   Procedure: CORONARY ARTERY BYPASS GRAFTING (CABG) TIMES FOUR USING LEFT INTERNAL MAMMARY VEIN AND RIGHT GREATER SAPHENOUS VEIN;  Surgeon: Melrose Nakayama, MD;  Location: [place];  Service: Open Heart Surgery;  Laterality: N/A;   ENDOVEIN HARVEST OF GREATER SAPHENOUS VEIN Right 01/26/2020   Procedure: [place] Of Greater Saphenous Vein;  Surgeon: Melrose Nakayama, MD;  Location: [place];  Service: Open Heart Surgery;  Laterality: Right;   ESOPHAGOGASTRODUODENOSCOPY N/A 10/03/2018   Procedure: ESOPHAGOGASTRODUODENOSCOPY (EGD);  Surgeon: [name], MD;  Location[name];  Service: Endoscopy;  Laterality: N/A;   EUS N/A 10/03/2018   Procedure: UPPER ENDOSCOPIC ULTRASOUND (EUS) RADIAL;  Surgeon: [name], MD;  [facility];  Service: Endoscopy;  Laterality: N/A;   EYE SURGERY Bilateral    FINE NEEDLE ASPIRATION N/A 10/03/2018   Procedure: FINE NEEDLE ASPIRATION (FNA) LINEAR;  Surgeon: [name], MD;  [facility];  Service: Endoscopy;  Laterality: N/A;   IR ANGIO INTRA EXTRACRAN SEL COM CAROTID INNOMINATE BILAT MOD SED  01/21/2018   IR ANGIO INTRA EXTRACRAN SEL COM CAROTID  INNOMINATE UNI R MOD SED  03/21/2018   IR ANGIO VERTEBRAL SEL VERTEBRAL BILAT MOD SED  01/21/2018   IR TRANSCATH EXCRAN VERT OR CAR A STENT  03/21/2018   LEFT HEART CATH AND CORONARY ANGIOGRAPHY N/A 01/08/2020   Procedure: LEFT HEART CATH AND CORONARY ANGIOGRAPHY;  Surgeon: Leonie Man, MD;  Location: [place] [facility];  Service: Cardiovascular;  Laterality: N/A;   PORT-A-CATH REMOVAL N/A 06/20/2021   Procedure: MINOR REMOVAL PORT-A-CATH;  Surgeon: Virl Cagey, MD;  [facility];  Service: General;   Laterality: N/A;   PORTACATH PLACEMENT Left 12/23/2018   Procedure: INSERTION PORT-A-CATH;  Surgeon: Virl Cagey, MD;  [facility];  Service: General;  Laterality: Left;   RADIOLOGY WITH ANESTHESIA N/A 03/21/2018   Procedure: IR WITH ANESTHESIA WITH STENT PLACEMENT;  Surgeon: Luanne Bras, MD;  Location: [place];  Service: Radiology;  Laterality: N/A;   ROTATOR CUFF REPAIR     Left   SPLENECTOMY     TEE WITHOUT CARDIOVERSION N/A 11/18/2019   Procedure: TRANSESOPHAGEAL ECHOCARDIOGRAM (TEE) WITH PROPOFOL;  Surgeon: Herminio Commons, MD;  [facility];  Service: Cardiology;  Laterality: N/A;   TEE WITHOUT CARDIOVERSION N/A 01/26/2020   Procedure: TRANSESOPHAGEAL ECHOCARDIOGRAM (TEE);  Surgeon: Melrose Nakayama, MD;  Location: [place];  Service: Open Heart Surgery;  Laterality: N/A;   TONSILLECTOMY      Current Outpatient Medications  Medication Sig Dispense Refill   aspirin EC 81 MG EC tablet Take 1 tablet (81 mg total) by mouth daily.     atorvastatin (LIPITOR) 80 MG tablet Take 1 tablet (80 mg total) by mouth daily at 6 PM. 90 tablet 1   Continuous Blood Gluc Receiver (FREESTYLE LIBRE 2 READER) DEVI As directed 1 each 0   Continuous Blood Gluc Sensor (FREESTYLE LIBRE 2 SENSOR) MISC USE AS DIRECTED TO CHECK BLOOD SUGAR, REPLACE EVERY 14 DAYS 2 each 1   ECHINACEA COMPLEX PO Take 1,000 mg by mouth daily.     FREESTYLE PRECISION NEO TEST test strip USE TO TEST BLOOD SUGAR 4 TIMES DAILY. 100 strip 2   gabapentin (NEURONTIN) 300 MG capsule Take 1 capsule (300 mg total) by mouth 3 (three) times daily. 270 capsule 3   insulin aspart (NOVOLOG FLEXPEN) 100 UNIT/ML FlexPen Inject 14-20 Units into the skin 3 (three) times daily before meals. (Patient taking differently: Inject 18-24 Units into the skin 3 (three) times daily before meals.) 30 mL 1   Insulin Pen Needle 32G X 4 MM MISC 1 Device by Does not apply route daily. Use as directed to inject insulin four times daily 200 each  2   levothyroxine (SYNTHROID) 50 MCG tablet TAKE 1 TABLET EVERY DAY 90 tablet 1   metoprolol tartrate (LOPRESSOR) 25 MG tablet Take 25 mg by mouth 2 (two) times daily.     Omega-3 1000 MG CAPS Take 1,000 mg by mouth daily.     sertraline (ZOLOFT) 50 MG tablet TAKE 1 TABLET BY MOUTH DAILY 90 tablet 1   tamsulosin (FLOMAX) 0.4 MG CAPS capsule Take 1 capsule (0.4 mg total) by mouth daily. (Patient taking differently: Take 0.4 mg by mouth at bedtime.) 90 capsule 3   TOUJEO MAX SOLOSTAR 300 UNIT/ML Solostar Pen INJECT 60 UNITS INTO THE SKIN AT BEDTIME. 6 mL 0   traMADol (ULTRAM) 50 MG tablet Take 50 mg by mouth 4 (four) times daily as needed for severe pain.     vitamin B-12 (CYANOCOBALAMIN) 1000 MCG tablet Take  1,000 mcg by mouth daily.     nitroGLYCERIN (NITROSTAT) 0.4 MG SL tablet Place 1 tablet (0.4 mg total) under the tongue every 5 (five) minutes as needed for chest pain. 25 tablet 3   No current facility-administered medications for this visit.   Facility-Administered Medications Ordered in Other Visits  Medication Dose Route Frequency Provider Last Rate Last Admin   0.9 %  sodium chloride infusion   Intravenous Continuous Derek Jack, MD 20 mL/hr at 12/30/18 1351 New Bag at 12/30/18 1351   0.9 %  sodium chloride infusion   Intravenous Continuous Derek Jack, MD 20 mL/hr at 12/30/18 1401 New Bag at 12/30/18 1401   dextrose 5 % solution   Intravenous Once Derek Jack, MD       heparin lock flush 100 unit/mL  500 Units Intracatheter Once PRN Derek Jack, MD       sodium chloride flush (NS) 0.9 % injection 10 mL  10 mL Intracatheter PRN Derek Jack, MD   10 mL at 12/30/18 0911   sodium chloride flush (NS) 0.9 % injection 10 mL  10 mL Intracatheter PRN Derek Jack, MD   10 mL at 05/21/19 0845   Allergies:  Demerol [meperidine hcl]   ROS: No palpitations or syncope.  Physical Exam: VS:  BP 140/80    Pulse 86    Ht 5\' 10"  (1.778 m)    Wt  213 lb (96.6 kg)    SpO2 97%    BMI 30.56 kg/m , BMI Body mass index is 30.56 kg/m.  Wt Readings from Last 3 Encounters:  10/31/21 213 lb (96.6 kg)  10/06/21 214 lb (97.1 kg)  06/14/21 209 lb (94.8 kg)    General: Patient appears comfortable at rest. HEENT: Conjunctiva and lids normal, wearing a mask. Neck: Supple, no elevated JVP or carotid bruits, no thyromegaly. Lungs: Clear to auscultation, nonlabored breathing at rest. Cardiac: Regular rate and rhythm, no S3 or significant systolic murmur, no pericardial rub. Extremities: No pitting edema.  ECG:  An ECG dated 04/18/2021 was personally reviewed today and demonstrated:  Sinus rhythm.  Recent Labwork: 05/18/2021: ALT 24; AST 19; BUN 12; Hemoglobin 13.4; Platelets 181; Potassium 3.7; Sodium 138 07/05/2021: Creatinine, Ser 0.80     Component Value Date/Time   CHOL 111 05/17/2020 1329   TRIG 90 05/17/2020 1329   HDL 50 05/17/2020 1329   CHOLHDL 2.2 05/17/2020 1329   VLDL 15 01/06/2020 1008   LDLCALC 44 05/17/2020 1329    Other Studies Reviewed Today:  TEE 11/18/2019:  1. Left ventricular ejection fraction, by estimation, is 60 to 65%. The  left ventricle has normal function. The left ventricle has no regional  wall motion abnormalities.   2. Right ventricular systolic function is normal. The right ventricular  size is normal.   3. No left atrial/left atrial appendage thrombus was detected.   4. The mitral valve is grossly normal. No evidence of mitral valve  regurgitation.   5. The aortic valve is tricuspid. Aortic valve regurgitation is not  visualized. No aortic stenosis is present.   6. Agitated saline contrast bubble study was negative, with no evidence  of any interatrial shunt.    Carotid Dopplers and ABIs 01/22/2020: Summary:  Right Carotid: Velocities in the right ICA are consistent with a 1-39%  stenosis.   Left Carotid: Evidence consistent with a total occlusion of the left ICA.  Vertebrals: Bilateral  vertebral arteries demonstrate antegrade flow.   Right ABI: Resting right ankle-brachial index is within  normal range. No  evidence of significant right lower extremity arterial disease.  Left ABI: Resting left ankle-brachial index indicates mild left lower  extremity arterial disease.  Right Upper Extremity: Doppler waveform obliterate with right radial  compression. Doppler waveform obliterate with right ulnar compression.  Left Upper Extremity: Doppler waveform obliterate with left radial  compression. Doppler waveforms remain within normal limits with left ulnar  compression.   Assessment and Plan:  1.  Multivessel CAD status post CABG in May 2021.  Reports rare nitroglycerin use, refill provided.  Continue aspirin, Lopressor, and Lipitor.  2.  Cervical spinal stenosis.  He plans to seek second opinion for surgical management at [facility].  RCRI cardiac risk indicates class IV, 11% chance of major adverse cardiac event, relatively high risk based on substrate.  If he does contemplate surgical management, we would eventually recommend an echocardiogram and a Myoview to ensure normal LVEF and no substantial ischemic burden.  3.  Pancreatic cancer with follow-up by Dr. Delton Coombes.  Port-A-Cath removed in the interim.  He has follow-up imaging and office visit in March.  4.  Mixed hyperlipidemia, he has done well on high-dose Lipitor.   Medication Adjustments/Labs and Tests Ordered: Current medicines are reviewed at length with the patient today.  Concerns regarding medicines are outlined above.   Tests Ordered: No orders of the defined types were placed in this encounter.   Medication Changes: Meds ordered this encounter  Medications   nitroGLYCERIN (NITROSTAT) 0.4 MG SL tablet    Sig: Place 1 tablet (0.4 mg total) under the tongue every 5 (five) minutes as needed for chest pain.    Dispense:  25 tablet    Refill:  3    Disposition:  Follow up  6 months.  Signed, Satira Sark, MD, [facility] 10/31/2021 4:20 PM    [facility] [place], [place], [place] [address] Phone: [phone]; Fax: [phone]

## 2021-11-01 DIAGNOSIS — I10 Essential (primary) hypertension: Secondary | ICD-10-CM | POA: Diagnosis not present

## 2021-11-01 DIAGNOSIS — E782 Mixed hyperlipidemia: Secondary | ICD-10-CM | POA: Diagnosis not present

## 2021-11-15 DIAGNOSIS — M9951 Intervertebral disc stenosis of neural canal of cervical region: Secondary | ICD-10-CM | POA: Diagnosis not present

## 2021-11-15 DIAGNOSIS — E1165 Type 2 diabetes mellitus with hyperglycemia: Secondary | ICD-10-CM | POA: Diagnosis not present

## 2021-11-22 ENCOUNTER — Ambulatory Visit (HOSPITAL_COMMUNITY)
Admission: RE | Admit: 2021-11-22 | Discharge: 2021-11-22 | Disposition: A | Discharge status: Home / Self Care | Payer: HMO | Source: Ambulatory Visit | Attending: Hematology | Admitting: Hematology

## 2021-11-22 ENCOUNTER — Inpatient Hospital Stay (HOSPITAL_COMMUNITY): Payer: HMO | Attending: Hematology

## 2021-11-22 ENCOUNTER — Other Ambulatory Visit: Payer: Self-pay

## 2021-11-22 DIAGNOSIS — I82622 Acute embolism and thrombosis of deep veins of left upper extremity: Secondary | ICD-10-CM | POA: Insufficient documentation

## 2021-11-22 DIAGNOSIS — E1140 Type 2 diabetes mellitus with diabetic neuropathy, unspecified: Secondary | ICD-10-CM | POA: Insufficient documentation

## 2021-11-22 DIAGNOSIS — G629 Polyneuropathy, unspecified: Secondary | ICD-10-CM | POA: Insufficient documentation

## 2021-11-22 DIAGNOSIS — K8020 Calculus of gallbladder without cholecystitis without obstruction: Secondary | ICD-10-CM | POA: Diagnosis not present

## 2021-11-22 DIAGNOSIS — C259 Malignant neoplasm of pancreas, unspecified: Secondary | ICD-10-CM | POA: Insufficient documentation

## 2021-11-22 DIAGNOSIS — K5730 Diverticulosis of large intestine without perforation or abscess without bleeding: Secondary | ICD-10-CM | POA: Diagnosis not present

## 2021-11-22 DIAGNOSIS — C251 Malignant neoplasm of body of pancreas: Secondary | ICD-10-CM | POA: Insufficient documentation

## 2021-11-22 DIAGNOSIS — Z87891 Personal history of nicotine dependence: Secondary | ICD-10-CM | POA: Insufficient documentation

## 2021-11-22 DIAGNOSIS — Z9081 Acquired absence of spleen: Secondary | ICD-10-CM | POA: Insufficient documentation

## 2021-11-22 DIAGNOSIS — Z800 Family history of malignant neoplasm of digestive organs: Secondary | ICD-10-CM | POA: Insufficient documentation

## 2021-11-22 LAB — COMPREHENSIVE METABOLIC PANEL
ALT: 22 U/L (ref 0–44)
AST: 19 U/L (ref 15–41)
Albumin: 3.8 g/dL (ref 3.5–5.0)
Alkaline Phosphatase: 66 U/L (ref 38–126)
Anion gap: 7 (ref 5–15)
BUN: 18 mg/dL (ref 8–23)
CO2: 28 mmol/L (ref 22–32)
Calcium: 8.9 mg/dL (ref 8.9–10.3)
Chloride: 102 mmol/L (ref 98–111)
Creatinine, Ser: 0.83 mg/dL (ref 0.61–1.24)
GFR, Estimated: >60 mL/min (ref >60)
Glucose, Bld: 272 mg/dL — ABNORMAL HIGH (ref 70–99)
Potassium: 4.3 mmol/L (ref 3.5–5.1)
Sodium: 137 mmol/L (ref 135–145)
Total Bilirubin: 1 mg/dL (ref 0.3–1.2)
Total Protein: 6.8 g/dL (ref 6.5–8.1)

## 2021-11-22 LAB — CBC WITH DIFFERENTIAL/PLATELET
Abs Immature Granulocytes: 0 10*3/uL (ref 0.00–0.07)
Basophils Absolute: 0 10*3/uL (ref 0.0–0.1)
Basophils Relative: 1 %
Eosinophils Absolute: 0.1 10*3/uL (ref 0.0–0.5)
Eosinophils Relative: 2 %
HCT: 39.8 % (ref 39.0–52.0)
Hemoglobin: 13.7 g/dL (ref 13.0–17.0)
Immature Granulocytes: 0 %
Lymphocytes Relative: 57 %
Lymphs Abs: 2 10*3/uL (ref 0.7–4.0)
MCH: 34.7 pg — ABNORMAL HIGH (ref 26.0–34.0)
MCHC: 34.4 g/dL (ref 30.0–36.0)
MCV: 100.8 fL — ABNORMAL HIGH (ref 80.0–100.0)
Monocytes Absolute: 0.2 10*3/uL (ref 0.1–1.0)
Monocytes Relative: 5 %
Neutro Abs: 1.2 10*3/uL — ABNORMAL LOW (ref 1.7–7.7)
Neutrophils Relative %: 35 %
Platelets: 170 10*3/uL (ref 150–400)
RBC: 3.95 MIL/uL — ABNORMAL LOW (ref 4.22–5.81)
RDW: 15.8 % — ABNORMAL HIGH (ref 11.5–15.5)
WBC: 3.4 10*3/uL — ABNORMAL LOW (ref 4.0–10.5)
nRBC: 0 % (ref 0.0–0.2)

## 2021-11-22 MED ORDER — IOHEXOL 300 MG/ML  SOLN
100.0000 mL | Freq: Once | INTRAMUSCULAR | Status: AC | PRN
  Administered 2021-11-22: 100 mL via INTRAVENOUS

## 2021-11-23 LAB — CANCER ANTIGEN 19-9: CA 19-9: 10 U/mL (ref 0–35)

## 2021-11-28 ENCOUNTER — Ambulatory Visit (HOSPITAL_COMMUNITY): Payer: HMO | Attending: Physical Medicine and Rehabilitation | Admitting: Physical Therapy

## 2021-11-28 ENCOUNTER — Encounter (HOSPITAL_COMMUNITY): Payer: Self-pay | Admitting: Physical Therapy

## 2021-11-28 ENCOUNTER — Other Ambulatory Visit: Payer: Self-pay

## 2021-11-28 DIAGNOSIS — R262 Difficulty in walking, not elsewhere classified: Secondary | ICD-10-CM | POA: Diagnosis not present

## 2021-11-28 DIAGNOSIS — R29898 Other symptoms and signs involving the musculoskeletal system: Secondary | ICD-10-CM | POA: Diagnosis not present

## 2021-11-28 DIAGNOSIS — Z7409 Other reduced mobility: Secondary | ICD-10-CM | POA: Diagnosis not present

## 2021-11-28 DIAGNOSIS — Z789 Other specified health status: Secondary | ICD-10-CM | POA: Insufficient documentation

## 2021-11-28 NOTE — Therapy (Signed)
?OUTPATIENT PHYSICAL THERAPY CERVICAL EVALUATION ? ? ?Patient Name: Mark Foster ?MRN: [id number] ?DOB:Sep 05, 1955, 66 y.o., male ?Today's Date: 11/28/2021 ? ? PT End of Session - 11/28/21 1123   ? ? Visit Number 1   ? Number of Visits 10   ? Date for PT Re-Evaluation 01/02/22   ? Authorization Type [employer](no auth req / copay $20)   ? Authorization Time Period 09/04/2021 - 09/03/2022   ? Authorization - Visit Number 1   ? Progress Note Due on Visit 10   ? PT Start Time 1118   ? PT Stop Time 1207   ? PT Time Calculation (min) 49 min   ? Activity Tolerance Patient tolerated treatment well;No increased pain   ? Behavior During Therapy WFL for tasks assessed/performed;Flat affect   ? ?  ?  ? ?  ? ? ?Past Medical History:  ?Diagnosis Date  ? Bilateral carpal tunnel syndrome 10/19/2020  ? CAD (coronary artery disease)   ? a. 01/2020: CABG x4 with LIMA-LAD, SVG-OM, SVG-PDA and SVG-D1  ? Carotid artery obstruction, left   ? Left ICA occlusion  ? Cervical compression fracture (HCC)   ? Cervical spinal stenosis   ? Diabetic neuropathy ([place])   ? Bilateral legs  ? Diverticulitis   ? Family history of pancreatic cancer   ? History of kidney stones   ? Hypothyroidism   ? Pancreatic cancer ([place])   ? Stenosis of left vertebral artery   ? 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 Type 2 diabetes mellitus ([place])   ? ?Past Surgical History:  ?Procedure Laterality Date  ? APPENDECTOMY    ? BUBBLE STUDY N/A 11/18/2019  ? Procedure: BUBBLE STUDY;  Surgeon: Herminio Commons, MD;  [facility];  Service: Cardiology;  Laterality: N/A;  ? CARDIAC SURGERY    ? COLON RESECTION    ? For diverticulitis  ? COLON SURGERY    ? CORONARY ARTERY BYPASS GRAFT N/A 01/26/2020  ? Procedure: CORONARY ARTERY BYPASS GRAFTING (CABG) TIMES FOUR USING LEFT INTERNAL MAMMARY VEIN AND RIGHT GREATER SAPHENOUS VEIN;  Surgeon: Melrose Nakayama, MD;  Location: [place];  Service: Open Heart Surgery;  Laterality: N/A;  ? ENDOVEIN HARVEST OF GREATER  SAPHENOUS VEIN Right 01/26/2020  ? Procedure: [place] Of Greater Saphenous Vein;  Surgeon: Melrose Nakayama, MD;  Location: [place];  Service: Open Heart Surgery;  Laterality: Right;  ? ESOPHAGOGASTRODUODENOSCOPY N/A 10/03/2018  ? Procedure: ESOPHAGOGASTRODUODENOSCOPY (EGD);  Surgeon: [name], MD;  [name];  Service: Endoscopy;  Laterality: N/A;  ? EUS N/A 10/03/2018  ? Procedure: UPPER ENDOSCOPIC ULTRASOUND (EUS) RADIAL;  Surgeon: [name], MD;  [facility];  Service: Endoscopy;  Laterality: N/A;  ? EYE SURGERY Bilateral   ? FINE NEEDLE ASPIRATION N/A 10/03/2018  ? Procedure: FINE NEEDLE ASPIRATION (FNA) LINEAR;  Surgeon: [name], MD;  [facility];  Service: Endoscopy;  Laterality: N/A;  ? IR ANGIO INTRA EXTRACRAN SEL COM CAROTID INNOMINATE BILAT MOD SED  01/21/2018  ? IR ANGIO INTRA EXTRACRAN SEL COM CAROTID INNOMINATE UNI R MOD SED  03/21/2018  ? IR ANGIO VERTEBRAL SEL VERTEBRAL BILAT MOD SED  01/21/2018  ? IR TRANSCATH EXCRAN VERT OR CAR A STENT  03/21/2018  ? LEFT HEART CATH AND CORONARY ANGIOGRAPHY N/A 01/08/2020  ? Procedure: LEFT HEART CATH AND CORONARY ANGIOGRAPHY;  Surgeon: Leonie Man, MD;  Location: [place] [facility];  Service: Cardiovascular;  Laterality: N/A;  ? PORT-A-CATH REMOVAL  N/A 06/20/2021  ? Procedure: MINOR REMOVAL PORT-A-CATH;  Surgeon: Virl Cagey, MD;  [facility];  Service: General;  Laterality: N/A;  ? PORTACATH PLACEMENT Left 12/23/2018  ? Procedure: INSERTION PORT-A-CATH;  Surgeon: Virl Cagey, MD;  [facility];  Service: General;  Laterality: Left;  ? RADIOLOGY WITH ANESTHESIA N/A 03/21/2018  ? Procedure: IR WITH ANESTHESIA WITH STENT PLACEMENT;  Surgeon: Luanne Bras, MD;  Location: [place];  Service: Radiology;  Laterality: N/A;  ? ROTATOR CUFF REPAIR    ? Left  ? SPLENECTOMY    ? TEE WITHOUT CARDIOVERSION N/A 11/18/2019  ? Procedure: TRANSESOPHAGEAL ECHOCARDIOGRAM (TEE) WITH PROPOFOL;   Surgeon: Herminio Commons, MD;  [facility];  Service: Cardiology;  Laterality: N/A;  ? TEE WITHOUT CARDIOVERSION N/A 01/26/2020  ? Procedure: TRANSESOPHAGEAL ECHOCARDIOGRAM (TEE);  Surgeon: Melrose Nakayama, MD;  Location: [place];  Service: Open Heart Surgery;  Laterality: N/A;  ? TONSILLECTOMY    ? ?Patient Active Problem List  ? Diagnosis Date Noted  ? Bilateral carpal tunnel syndrome 10/19/2020  ? S/P CABG x 4 01/26/2020  ? Abnormal cardiac CT angiography 01/08/2020  ? Angina pectoris ([place])   ? CVA (cerebral vascular accident) ([place]) 10/08/2019  ? Injury of left ankle 03/24/2019  ? Port-A-Cath in place 12/19/2018  ? Genetic testing 11/15/2018  ? Loss of consciousness ([place]) 11/12/2018  ? Generalized weakness 11/11/2018  ? Family history of pancreatic cancer   ? Hyperglycemia 10/09/2018  ? Tobacco abuse counseling 10/09/2018  ? Pancreatic adenocarcinoma ([place]) 09/03/2018  ? H/O ETOH abuse 09/03/2018  ? Hyperbilirubinemia 09/03/2018  ? 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 Uncontrolled type 2 diabetes mellitus with hyperglycemia ([place]) 03/05/2018  ? Hypothyroidism 03/05/2018  ? Intracranial atherosclerosis 01/22/2018  ? Spinal stenosis in cervical region   ? Diabetes mellitus type 2 in nonobese [facility])   ? Left carotid artery occlusion 01/21/2018  ? Cervical stenosis of spinal canal 01/21/2018  ? Right hand weakness 01/21/2018  ? Numbness of right hand 01/21/2018  ? Essential hypertension, benign 01/21/2018  ? Mixed hyperlipidemia 01/21/2018  ? Diabetes ([place]) 01/21/2018  ? Hx of completed stroke 01/21/2018  ? Leukopenia 01/21/2018  ? Thrombocytopenia ([place]) 01/21/2018  ? L MCA Stroke-like episode ([place]) s/p IV tPA 01/19/2018  ? ? ?PCP: Celene Squibb, MD ? ?REFERRING PROVIDER: Edger House, MD ? 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 ?THERAPY DIAG:  ?Difficulty in walking, not elsewhere classified ? ?Impaired mobility and  ADLs ? ?Upper extremity weakness ? ?Weakness of both lower extremities ? ?ONSET DATE: 2019 ? ?SUBJECTIVE:                                                                                                                                                                                                        ? ?  SUBJECTIVE STATEMENT: ?Patient reports that he has had a balance issue before 2019 when he had surgery for his pancreatic cancer. He states that he is needing surgery on his neck per his referring physician, but the doctor is wanting him to improve his strength level prior to going through with the surgery in order to assist with the recovery phase. He has weakness in the arms and legs and he finds it difficult to complete medium to longer tasks at home due to his lowered level of endurance. ? ?PERTINENT HISTORY:  ?Pancreatic surgery for cancer and quadruple bypass. ? ?PAIN:  ?Are you having pain? Yes: NPRS scale: 3/10 ?Pain location: Cervical spine ?Pain description: [place] Ard ?Aggravating factors: Certain movements ?Relieving factors: Rest ? ?PRECAUTIONS: Fall ? ?WEIGHT BEARING RESTRICTIONS No ? ?FALLS:  ?Has patient fallen in last 6 months? No ?Number of falls: N/A ? ?LIVING ENVIRONMENT: ?Lives with: lives with their spouse ?Lives in: Mobile home ?Stairs: Yes: External: 3 and 8 steps; can reach both ?Has following equipment at home: Single point cane ? ?OCCUPATION: Retired ? ?PLOF: Independent ? ?PATIENT GOALS Patient would like to carry out his ADLs without his weakness affecting his LE and UEs. Improve his strength in order to prepare for the upcoming cervical surgery. ? ?OBJECTIVE:  ? ?DIAGNOSTIC FINDINGS:  ?MRI of cervical spine performed on 11/19/2020 indicated, "Comparison is made to the prior cervical spine MRI of 01/20/2018. ?Cervical spondylosis as outlined and with findings most notably as ?follows. ?  ?At C5-C6, a posterior disc osteophyte contributes to progressive ?multifactorial severe spinal canal  stenosis (greater on the left). ?Moderate flattening of the spinal cord (greater on the left), also ?progressed. Unchanged T2 hyperintense signal abnormality within the ?left aspect of the spinal cord at this lev

## 2021-11-29 ENCOUNTER — Inpatient Hospital Stay (HOSPITAL_COMMUNITY): Payer: HMO | Admitting: Hematology

## 2021-11-29 VITALS — BP 134/75 | HR 72 | Temp 98.3°F | Resp 18 | Ht 70.0 in | Wt 219.5 lb

## 2021-11-29 DIAGNOSIS — Z87891 Personal history of nicotine dependence: Secondary | ICD-10-CM | POA: Diagnosis not present

## 2021-11-29 DIAGNOSIS — Z800 Family history of malignant neoplasm of digestive organs: Secondary | ICD-10-CM | POA: Insufficient documentation

## 2021-11-29 DIAGNOSIS — G629 Polyneuropathy, unspecified: Secondary | ICD-10-CM | POA: Insufficient documentation

## 2021-11-29 DIAGNOSIS — C259 Malignant neoplasm of pancreas, unspecified: Secondary | ICD-10-CM | POA: Diagnosis not present

## 2021-11-29 DIAGNOSIS — Z9081 Acquired absence of spleen: Secondary | ICD-10-CM | POA: Insufficient documentation

## 2021-11-29 DIAGNOSIS — C251 Malignant neoplasm of body of pancreas: Secondary | ICD-10-CM | POA: Diagnosis not present

## 2021-11-29 DIAGNOSIS — I82622 Acute embolism and thrombosis of deep veins of left upper extremity: Secondary | ICD-10-CM | POA: Diagnosis not present

## 2021-11-29 DIAGNOSIS — E1140 Type 2 diabetes mellitus with diabetic neuropathy, unspecified: Secondary | ICD-10-CM | POA: Insufficient documentation

## 2021-11-29 NOTE — Patient Instructions (Addendum)
[place] at [facility] ?Discharge Instructions ? ?You were seen and examined today by Dr. Delton Coombes. He reviewed your most recent labs and scan everything looks stable. We will order a repeat scan. Please keep follow up appointments as scheduled in 6 months. ? ? ?Thank you for choosing [place] at [facility] to provide your oncology and hematology care.  To afford each patient quality time with our provider, please arrive at least 15 minutes before your scheduled appointment time.  ? ?If you have a lab appointment with the [place] please come in thru the Main Entrance and check in at the main information desk. ? ?You need to re-schedule your appointment should you arrive 10 or more minutes late.  We strive to give you quality time with our providers, and arriving late affects you and other patients whose appointments are after yours.  Also, if you no show three or more times for appointments you may be dismissed from the clinic at the providers discretion.     ?Again, thank you for choosing [facility].  Our hope is that these requests will decrease the amount of time that you wait before being seen by our physicians.       ?_____________________________________________________________ ? ?Should you have questions after your visit to [facility], please contact our office at [phone] and follow the prompts.  Our office hours are 8:00 a.m. and 4:30 p.m. Monday - Friday.  Please note that voicemails left after 4:00 p.m. may not be returned until the following business day.  We are closed weekends and major holidays.  You do have access to a nurse 24-7, just call the main number to the clinic [phone] and do not press any options, hold on the line and a nurse will answer the phone.   ? ?For prescription refill requests, have your pharmacy contact our office and allow 72 hours.   ? ?Due to Covid, you will need to wear a mask  upon entering the hospital. If you do not have a mask, a mask will be given to you at the Main Entrance upon arrival. For doctor visits, patients may have 1 support person age 8 or older with them. For treatment visits, patients can not have anyone with them due to social distancing guidelines and our immunocompromised population.  ? ?  ?

## 2021-11-29 NOTE — Progress Notes (Signed)
? ?[facility] ?[address] ?[place], [place] [address] ? ? ?CLINIC:  ?Medical Oncology/Hematology ? ?PCP:  ?Celene Squibb, MD ?[address] [place] [address] ?[phone] ? ? ?REASON FOR VISIT:  ?Follow-up for pancreatic adenocarcinoma ? ?PRIOR THERAPY:  ?1. Distal pancreatectomy and splenectomy at [facility] on 11/20/2018. ?2. Adjuvant FOLFIRINOX x 12 cycles from 12/30/2018 to 06/04/2019. ? ?NGS Results: BRCA 1 heterozygous VUS ? ?CURRENT THERAPY: Surveillance ? ?BRIEF ONCOLOGIC HISTORY:  ?Oncology History  ?Pancreatic adenocarcinoma ([place])  ?08/20/2018 Imaging  ? CT abdomen/pelvis w/ contrast: ?IMPRESSION: ?Atrophy and ductal dilatation involving the pancreatic tail, with ?suspected small soft tissue mass in the pancreatic body which could ?represent pancreatic carcinoma. Abdomen MRI and MRCP without and ?with contrast is recommended for further evaluation. ?  ?No evidence of hepatobiliary disease. ?  ?08/30/2018 Imaging  ? MRI abdomen w/ contrast: ?IMPRESSION: ?1. Although not definitive, there remains concern of a small ?hypoenhancing mass at the junction of the pancreatic body and tail ?associated with atrophy and ductal dilatation in the pancreatic ?tail. This remains concerning for pancreatic neoplasm. ?Postinflammatory stricture less likely. Besides a tiny cystic lesion ?in the pancreatic tail, there are no other signs of previous ?pancreatitis. Further evaluation with endoscopic ultrasound for ?possible biopsy strongly recommended. ?2. No evidence of metastatic disease. ?3. Cholelithiasis without evidence of cholecystitis or biliary ?dilatation. ?  ?09/03/2018 Initial Diagnosis  ? Pancreatic adenocarcinoma [facility]) ?  ?10/03/2018 Procedure  ? EUS: ?- 2.6cm irregularly shaped mass in the body of the pancreas that causing main pancreatic ?duct obstruction and dilation. The mass abuts the splenic vessels but no other significant ?vascular structures and it was sampled with trangastric EUS FNA.  Preliminary cytology is + ?for malignancy, likely well-differentiated adenocarcinoma. It appears surgically resectable. ?  ?10/03/2018 Pathology Results  ? Accession: [id number] ? ?FINE NEEDLE ASPIRATION, ENDOSCOPIC, PANCREAS BODY (SPECIMEN 1 OF 1 COLLECTED 10/03/18): ?ADENOCARCINOMA. ?  ?10/17/2018 Imaging  ? PET: ?IMPRESSION: ?1. Tiny focus of hypermetabolism identified in the body of pancreas, ?adjacent to the abrupt cut off of the main pancreatic duct. No ?evidence for hypermetabolic metastatic disease in the neck, chest, ?abdomen, or pelvis. ?2. Cholelithiasis. ?3.  Aortic Atherosclerois (ICD10-170.0) ?4. Prostatomegaly ?  ?11/12/2018 Genetic Testing  ? BRCA1 VUS identified on the common hereditary cancer panel.  The Hereditary Gene Panel offered by [facility] includes sequencing and/or deletion duplication testing of the following 47 genes: APC, ATM, AXIN2, BARD1, BMPR1A, BRCA1, BRCA2, BRIP1, CDH1, CDK4, CDKN2A (p14ARF), CDKN2A (p16INK4a), CHEK2, CTNNA1, DICER1, EPCAM (Deletion/duplication testing only), GREM1 (promoter region deletion/duplication testing only), KIT, MEN1, MLH1, MSH2, MSH3, MSH6, MUTYH, NBN, NF1, NHTL1, PALB2, PDGFRA, PMS2, POLD1, POLE, PTEN, RAD50, RAD51C, RAD51D, SDHB, SDHC, SDHD, SMAD4, SMARCA4. STK11, TP53, TSC1, TSC2, and VHL.  The following genes were evaluated for sequence changes only: SDHA and HOXB13 c.251G>A variant only. The report date is November 12, 2018. ? ?The variant of uncertain significance (VUS) in BRCA1 at c.1259A>G (p.Asp420Gly) was reclassified to a benign variant. The change in variant classification was made as a result of re-review of the evidence in light of new variant interpretation guidelines and/or new information. The amended report date is March 11, 2020.  ?  ?11/20/2018 Surgery  ? Distal subtotal pancreatectomy with splenectomy at [facility] ?  ?11/20/2018 Surgery  ? TUMOR ?   Tumor Site:    Pancreatic body  ?   Histologic Type:    Ductal adenocarcinoma  ?   Histologic Grade:  G1: Well differentiated  ?   Tumor Size:    Greatest dimension in Centimeters (cm): 2.8 Centimeters (cm) ?     Additional Dimension in Centimeters (cm):    2.7 Centimeters (cm) ?     Additional Dimension in Centimeters (cm):    1.8 Centimeters (cm) ?   Tumor Extent:     ?     Tumor Extension:    Tumor is confined to pancreas  ?   Accessory Findings:     ?     Treatment Effect:    No known presurgical therapy  ?     Lymphovascular Invasion:    Not identified  ?     Perineural Invasion:    Not identified  ? ?MARGINS ?   Margins:     ?     Proximal Pancreatic Parenchymal Margin:    Uninvolved by invasive carcinoma and pancreatic high-grade intraepithelial neoplasia  ?       Distance of Invasive Carcinoma from Margin:    0.6 Centimeters (cm) ?   :     ?     Other Margin:    Splenic margin.  ?       Margin Status:    Uninvolved by invasive carcinoma  ? ?LYMPH NODES ? Number of Lymph Nodes Involved:    2  ? Number of Lymph Nodes Examined:    16  ? ?PATHOLOGIC STAGE CLASSIFICATION  (pTNM, AJCC 8th Edition) ? TNM Descriptors:    Not applicable  ? Primary Tumor (pT):    pT2  ? Regional Lymph Nodes (pN):    pN1  ? ?ADDITIONAL FINDINGS ? Additional Pathologic Findings:    Pancreatic intraepithelial neoplasia  ?   Highest Grade (PanIN):    High grade PanIN is present.  ? Additional Pathologic Findings:    Benign unilocular pancreatic cyst (1.3 cm in greatest dimension) at the pancreatic tail. ?  ?12/30/2018 -  Chemotherapy  ? The patient had palonosetron (ALOXI) injection 0.25 mg, 0.25 mg, Intravenous,  Once, 12 of 12 cycles ?Administration: 0.25 mg (12/30/2018), 0.25 mg (01/13/2019), 0.25 mg (01/28/2019), 0.25 mg (02/11/2019), 0.25 mg (02/25/2019), 0.25 mg (03/11/2019), 0.25 mg (03/25/2019), 0.25 mg (04/09/2019), 0.25 mg (04/23/2019), 0.25 mg (05/07/2019), 0.25 mg (05/21/2019), 0.25 mg (06/04/2019) ?pegfilgrastim (NEULASTA) injection 6 mg, 6 mg, Subcutaneous, Once, 7 of 7 cycles ?Administration: 6 mg (01/01/2019), 6 mg (01/15/2019), 6 mg  (01/30/2019), 6 mg (02/13/2019), 6 mg (02/27/2019), 6 mg (03/13/2019), 6 mg (03/27/2019) ?pegfilgrastim-jmdb (FULPHILA) injection 6 mg, 6 mg, Subcutaneous,  Once, 5 of 5 cycles ?Administration: 6 mg (04/11/2019), 6 mg (04/25/2019), 6 mg (05/09/2019), 6 mg (05/23/2019), 6 mg (06/06/2019) ?irinotecan (CAMPTOSAR) 280 mg in sodium chloride 0.9 % 500 mL chemo infusion, 150 mg/m2 = 280 mg (100 % of original dose 150 mg/m2), Intravenous,  Once, 12 of 12 cycles ?Dose modification: 150 mg/m2 (original dose 150 mg/m2, Cycle 1, Reason: Provider Judgment) ?Administration: 280 mg (12/30/2018), 280 mg (01/13/2019), 280 mg (01/28/2019), 280 mg (02/11/2019), 280 mg (02/25/2019), 280 mg (03/11/2019), 280 mg (03/25/2019), 280 mg (04/09/2019), 280 mg (04/23/2019), 280 mg (05/07/2019), 280 mg (05/21/2019), 280 mg (06/04/2019) ?leucovorin 700 mg in sodium chloride 0.9 % 250 mL infusion, 744 mg, Intravenous,  Once, 12 of 12 cycles ?Administration: 700 mg (12/30/2018), 700 mg (01/13/2019), 700 mg (01/28/2019), 700 mg (02/11/2019), 700 mg (02/25/2019), 700 mg (03/11/2019), 700 mg (03/25/2019), 700 mg (04/09/2019), 700 mg (04/23/2019), 700 mg (05/07/2019), 700 mg (05/21/2019), 700 mg (06/04/2019) ?oxaliplatin (ELOXATIN) 120 mg in dextrose  5 % 500 mL chemo infusion, 63.75 mg/m2 = 120 mg (75 % of original dose 85 mg/m2), Intravenous,  Once, 12 of 12 cycles ?Dose modification: 63.75 mg/m2 (75 % of original dose 85 mg/m2, Cycle 1, Reason: Other (see comments), Comment: high risk for worsening neuropathy), 42.5 mg/m2 (50 % of original dose 85 mg/m2, Cycle 11, Reason: Other (see comments), Comment: neuropathy) ?Administration: 120 mg (12/30/2018), 120 mg (01/13/2019), 120 mg (01/28/2019), 120 mg (02/11/2019), 120 mg (02/25/2019), 120 mg (03/11/2019), 120 mg (03/25/2019), 120 mg (04/09/2019), 120 mg (04/23/2019), 120 mg (05/07/2019), 80 mg (05/21/2019), 80 mg (06/04/2019) ?fosaprepitant (EMEND) 150 mg, dexamethasone (DECADRON) 12 mg in sodium chloride 0.9 % 145 mL IVPB, , Intravenous,  Once, 12 of 12  cycles ?Administration:  (12/30/2018),  (01/13/2019),  (01/28/2019),  (02/11/2019),  (02/25/2019),  (03/11/2019),  (03/25/2019),  (04/09/2019),  (04/23/2019),  (05/07/2019),  (05/21/2019),  (06/04/2019) ?fluorouracil (ADRUCIL) 4,450 m

## 2021-12-05 ENCOUNTER — Encounter (HOSPITAL_COMMUNITY): Payer: Self-pay | Admitting: Physical Therapy

## 2021-12-05 ENCOUNTER — Ambulatory Visit (HOSPITAL_COMMUNITY): Payer: HMO | Attending: Physical Medicine and Rehabilitation | Admitting: Physical Therapy

## 2021-12-05 DIAGNOSIS — R29898 Other symptoms and signs involving the musculoskeletal system: Secondary | ICD-10-CM | POA: Diagnosis not present

## 2021-12-05 DIAGNOSIS — Z7409 Other reduced mobility: Secondary | ICD-10-CM | POA: Insufficient documentation

## 2021-12-05 DIAGNOSIS — R262 Difficulty in walking, not elsewhere classified: Secondary | ICD-10-CM | POA: Insufficient documentation

## 2021-12-05 DIAGNOSIS — Z789 Other specified health status: Secondary | ICD-10-CM | POA: Insufficient documentation

## 2021-12-05 NOTE — Therapy (Signed)
?OUTPATIENT PHYSICAL THERAPY TREATMENT NOTE ? ? ?Patient Name: Mark Foster ?MRN: [id number] ?DOB:12/16/1955, 66 y.o., male ?Today's Date: 12/05/2021 ? ?PCP: Celene Squibb, MD ?REFERRING PROVIDER: Celene Squibb, MD ? ? PT End of Session - 12/05/21 7322   ? ? Visit Number 2   ? Number of Visits 10   ? Date for PT Re-Evaluation 01/02/22   ? Authorization Type [employer](no auth req / copay $20)   ? Authorization Time Period 09/04/2021 - 09/03/2022   ? Authorization - Visit Number 2   ? Progress Note Due on Visit 10   ? PT Start Time 0813   ? Activity Tolerance Patient tolerated treatment well;No increased pain   ? Behavior During Therapy WFL for tasks assessed/performed;Flat affect   ? ?  ?  ? ?  ? ? ?Past Medical History:  ?Diagnosis Date  ? Bilateral carpal tunnel syndrome 10/19/2020  ? CAD (coronary artery disease)   ? a. 01/2020: CABG x4 with LIMA-LAD, SVG-OM, SVG-PDA and SVG-D1  ? Carotid artery obstruction, left   ? Left ICA occlusion  ? Cervical compression fracture (HCC)   ? Cervical spinal stenosis   ? Diabetic neuropathy ([place])   ? Bilateral legs  ? Diverticulitis   ? Family history of pancreatic cancer   ? History of kidney stones   ? Hypothyroidism   ? Pancreatic cancer ([place])   ? Stenosis of left vertebral artery   ? Stroke (cerebrum) ([place]) 10/09/2019  ? Type 2 diabetes mellitus ([place])   ? ?Past Surgical History:  ?Procedure Laterality Date  ? APPENDECTOMY    ? BUBBLE STUDY N/A 11/18/2019  ? Procedure: BUBBLE STUDY;  Surgeon: Herminio Commons, MD;  Location: [facility];  Service: Cardiology;  Laterality: N/A;  ? CARDIAC SURGERY    ? COLON RESECTION    ? For diverticulitis  ? COLON SURGERY    ? CORONARY ARTERY BYPASS GRAFT N/A 01/26/2020  ? Procedure: CORONARY ARTERY BYPASS GRAFTING (CABG) TIMES FOUR USING LEFT INTERNAL MAMMARY VEIN AND RIGHT GREATER SAPHENOUS VEIN;  Surgeon: Melrose Nakayama, MD;  Location: [facility];  Service: Open Heart Surgery;  Laterality: N/A;  ? ENDOVEIN HARVEST OF GREATER SAPHENOUS  VEIN Right 01/26/2020  ? Procedure: [place] Of Greater Saphenous Vein;  Surgeon: Melrose Nakayama, MD;  Location: [facility];  Service: Open Heart Surgery;  Laterality: Right;  ? ESOPHAGOGASTRODUODENOSCOPY N/A 10/03/2018  ? Procedure: ESOPHAGOGASTRODUODENOSCOPY (EGD);  Surgeon: [name], MD;  Location: [name];  Service: Endoscopy;  Laterality: N/A;  ? EUS N/A 10/03/2018  ? Procedure: UPPER ENDOSCOPIC ULTRASOUND (EUS) RADIAL;  Surgeon: [name], MD;  Location: [facility];  Service: Endoscopy;  Laterality: N/A;  ? EYE SURGERY Bilateral   ? FINE NEEDLE ASPIRATION N/A 10/03/2018  ? Procedure: FINE NEEDLE ASPIRATION (FNA) LINEAR;  Surgeon: [name], MD;  Location: [facility];  Service: Endoscopy;  Laterality: N/A;  ? IR ANGIO INTRA EXTRACRAN SEL COM CAROTID INNOMINATE BILAT MOD SED  01/21/2018  ? IR ANGIO INTRA EXTRACRAN SEL COM CAROTID INNOMINATE UNI R MOD SED  03/21/2018  ? IR ANGIO VERTEBRAL SEL VERTEBRAL BILAT MOD SED  01/21/2018  ? IR TRANSCATH EXCRAN VERT OR CAR A STENT  03/21/2018  ? LEFT HEART CATH AND CORONARY ANGIOGRAPHY N/A 01/08/2020  ? Procedure: LEFT HEART CATH AND CORONARY ANGIOGRAPHY;  Surgeon: Leonie Man, MD;  Location: [facility];  Service: Cardiovascular;  Laterality: N/A;  ? PORT-A-CATH REMOVAL N/A 06/20/2021  ?  Procedure: MINOR REMOVAL PORT-A-CATH;  Surgeon: Virl Cagey, MD;  Location: [facility];  Service: General;  Laterality: N/A;  ? PORTACATH PLACEMENT Left 12/23/2018  ? Procedure: INSERTION PORT-A-CATH;  Surgeon: Virl Cagey, MD;  Location: [facility];  Service: General;  Laterality: Left;  ? RADIOLOGY WITH ANESTHESIA N/A 03/21/2018  ? Procedure: IR WITH ANESTHESIA WITH STENT PLACEMENT;  Surgeon: Luanne Bras, MD;  Location: [facility];  Service: Radiology;  Laterality: N/A;  ? ROTATOR CUFF REPAIR    ? Left  ? SPLENECTOMY    ? TEE WITHOUT CARDIOVERSION N/A 11/18/2019  ? Procedure: TRANSESOPHAGEAL ECHOCARDIOGRAM (TEE) WITH PROPOFOL;  Surgeon:  Herminio Commons, MD;  Location: [facility];  Service: Cardiology;  Laterality: N/A;  ? TEE WITHOUT CARDIOVERSION N/A 01/26/2020  ? Procedure: TRANSESOPHAGEAL ECHOCARDIOGRAM (TEE);  Surgeon: Melrose Nakayama, MD;  Location: [facility];  Service: Open Heart Surgery;  Laterality: N/A;  ? TONSILLECTOMY    ? ?Patient Active Problem List  ? Diagnosis Date Noted  ? Bilateral carpal tunnel syndrome 10/19/2020  ? S/P CABG x 4 01/26/2020  ? Abnormal cardiac CT angiography 01/08/2020  ? Angina pectoris ([place])   ? CVA (cerebral vascular accident) ([place]) 10/08/2019  ? Injury of left ankle 03/24/2019  ? Port-A-Cath in place 12/19/2018  ? Genetic testing 11/15/2018  ? Loss of consciousness ([place]) 11/12/2018  ? Generalized weakness 11/11/2018  ? Family history of pancreatic cancer   ? Hyperglycemia 10/09/2018  ? Tobacco abuse counseling 10/09/2018  ? Pancreatic adenocarcinoma ([place]) 09/03/2018  ? H/O ETOH abuse 09/03/2018  ? Hyperbilirubinemia 09/03/2018  ? 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 Uncontrolled type 2 diabetes mellitus with hyperglycemia ([place]) 03/05/2018  ? Hypothyroidism 03/05/2018  ? Intracranial atherosclerosis 01/22/2018  ? Spinal stenosis in cervical region   ? Diabetes mellitus type 2 in nonobese [facility])   ? Left carotid artery occlusion 01/21/2018  ? Cervical stenosis of spinal canal 01/21/2018  ? Right hand weakness 01/21/2018  ? Numbness of right hand 01/21/2018  ? Essential hypertension, benign 01/21/2018  ? Mixed hyperlipidemia 01/21/2018  ? Diabetes ([place]) 01/21/2018  ? Hx of completed stroke 01/21/2018  ? Leukopenia 01/21/2018  ? Thrombocytopenia ([place]) 01/21/2018  ? L MCA Stroke-like episode ([place]) s/p IV tPA 01/19/2018  ? ? 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 ?THERAPY DIAG:  ?Difficulty in walking, not elsewhere classified ? ?Impaired mobility and ADLs ? ?Upper extremity weakness ? ?Weakness of both lower  extremities ? ?PERTINENT HISTORY: Pancreatic surgery for cancer and quadruple bypass. ? ?PRECAUTIONS: Fall ? ?SUBJECTIVE: Patient reports that he woke up with a sore neck, but despite that he is having one of his betters days in regard to strength. ? ?PAIN:  ?Are you having pain? Yes: NPRS scale: 4/10 ?Pain location: Neck ?Pain description: Sore ?Aggravating factors: Movement ?Relieving factors: Rest ? ?OBJECTIVE:   ?  ?TODAY'S TREATMENT:  ?12/05/21: ?Aerobic: ?-NuStepw/ arms lvl3 x64mn ?-Armbike lvl6 3x1101m each ?Seated: ?-Leg Press(Body craft) #4 4x12 ?-Chest press(Body craft) #2 4x8 ?-Knee extension #2 1x8 and #1 2x12 ?  ?  ?PATIENT EDUCATION:  ?Education details: HEP, POC, and effects of nerve pressure with stenosis. ?Person educated: Patient ?Education method: Explanation and Handouts ?Education comprehension: verbalized understanding ?  ?  ?HOME EXERCISE PROGRAM: ?Access Code: [email or web address] ?URL: [email or web address] ?Date: 11/28/2021 ?Prepared by: CoAdalberto Cole  ?Exercises ?- Thoracic Extension Mobilization on Foam Roll  - 3-4 x daily - 7 x weekly - 2 sets -  8 reps - 2s hold ?- Seated Knee Extension AROM  - 2-3 x daily - 7 x weekly - 2 sets - 12 reps ?- Standing Knee Flexion  - 2-3 x daily - 7 x weekly - 2 sets - 8 reps ?- Supine Bridge  - 2-3 x daily - 7 x weekly - 2 sets - 6 reps ?- Standing Hip Abduction with Counter Support  - 2-3 x daily - 7 x weekly - 2 sets - 8 reps ?- Heel Raises with Counter Support  - 2-3 x daily - 7 x weekly - 2 sets - 12 reps ?- Heel Toe Raises with Counter Support  - 2-3 x daily - 7 x weekly - 2 sets - 8 reps ?  ?ASSESSMENT: ?  ?CLINICAL IMPRESSION: ?Patient''s gait speed has noticeably improved compared to the time of the evaluation. He does still use the [facility] for ambulation. Fatigue was a clear factor during this session and frequent rest breaks were needed between exercises.  ?  ?OBJECTIVE IMPAIRMENTS Abnormal gait, decreased activity tolerance, decreased balance, decreased  mobility, difficulty walking, decreased ROM, decreased strength, postural dysfunction, and pain.  ?  ?ACTIVITY LIMITATIONS cleaning, community activity, driving, meal prep, laundry, yard work, shopping, and yard work.

## 2021-12-07 ENCOUNTER — Ambulatory Visit (HOSPITAL_COMMUNITY): Payer: HMO | Admitting: Physical Therapy

## 2021-12-07 ENCOUNTER — Encounter (HOSPITAL_COMMUNITY): Payer: Self-pay | Admitting: Physical Therapy

## 2021-12-07 DIAGNOSIS — R262 Difficulty in walking, not elsewhere classified: Secondary | ICD-10-CM | POA: Diagnosis not present

## 2021-12-07 DIAGNOSIS — Z7409 Other reduced mobility: Secondary | ICD-10-CM

## 2021-12-07 DIAGNOSIS — R29898 Other symptoms and signs involving the musculoskeletal system: Secondary | ICD-10-CM

## 2021-12-07 NOTE — Patient Instructions (Signed)
Access Code: [email or web address] ?URL: [email or web address] ?Date: 12/07/2021 ?Prepared by: Mitzi Hansen Xion Debruyne ? ?Exercises ?- Runner's Step Up/Down  - 1 x daily - 7 x weekly - 2 sets - 10 reps ?

## 2021-12-07 NOTE — Therapy (Signed)
?OUTPATIENT PHYSICAL THERAPY TREATMENT NOTE ? ? ?Patient Name: Mark Foster ?MRN: [id number] ?DOB:11-21-1955, 66 y.o., male ?Today's Date: 12/07/2021 ? ?PCP: Celene Squibb, MD ?REFERRING PROVIDER: Edger House, MD ? ? PT End of Session - 12/07/21 0815   ? ? Visit Number 3   ? Number of Visits 10   ? Date for PT Re-Evaluation 01/02/22   ? Authorization Type [employer](no auth req / copay $20)   ? Authorization Time Period 09/04/2021 - 09/03/2022   ? Authorization - Visit Number 3   ? Progress Note Due on Visit 10   ? PT Start Time 0815   ? PT Stop Time 0855   ? PT Time Calculation (min) 40 min   ? Activity Tolerance Patient tolerated treatment well;No increased pain   ? Behavior During Therapy WFL for tasks assessed/performed;Flat affect   ? ?  ?  ? ?  ? ? ?Past Medical History:  ?Diagnosis Date  ? Bilateral carpal tunnel syndrome 10/19/2020  ? CAD (coronary artery disease)   ? a. 01/2020: CABG x4 with LIMA-LAD, SVG-OM, SVG-PDA and SVG-D1  ? Carotid artery obstruction, left   ? Left ICA occlusion  ? Cervical compression fracture (HCC)   ? Cervical spinal stenosis   ? Diabetic neuropathy ([place])   ? Bilateral legs  ? Diverticulitis   ? Family history of pancreatic cancer   ? History of kidney stones   ? Hypothyroidism   ? Pancreatic cancer ([place])   ? Stenosis of left vertebral artery   ? Stroke (cerebrum) ([place]) 10/09/2019  ? Type 2 diabetes mellitus ([place])   ? ?Past Surgical History:  ?Procedure Laterality Date  ? APPENDECTOMY    ? BUBBLE STUDY N/A 11/18/2019  ? Procedure: BUBBLE STUDY;  Surgeon: Herminio Commons, MD;  [facility];  Service: Cardiology;  Laterality: N/A;  ? CARDIAC SURGERY    ? COLON RESECTION    ? For diverticulitis  ? COLON SURGERY    ? CORONARY ARTERY BYPASS GRAFT N/A 01/26/2020  ? Procedure: CORONARY ARTERY BYPASS GRAFTING (CABG) TIMES FOUR USING LEFT INTERNAL MAMMARY VEIN AND RIGHT GREATER SAPHENOUS VEIN;  Surgeon: Melrose Nakayama, MD;  Location: [place];  Service: Open Heart Surgery;   Laterality: N/A;  ? ENDOVEIN HARVEST OF GREATER SAPHENOUS VEIN Right 01/26/2020  ? Procedure: [place] Of Greater Saphenous Vein;  Surgeon: Melrose Nakayama, MD;  Location: [place];  Service: Open Heart Surgery;  Laterality: Right;  ? ESOPHAGOGASTRODUODENOSCOPY N/A 10/03/2018  ? Procedure: ESOPHAGOGASTRODUODENOSCOPY (EGD);  Surgeon: [name], MD;  Location: [name];  Service: Endoscopy;  Laterality: N/A;  ? EUS N/A 10/03/2018  ? Procedure: UPPER ENDOSCOPIC ULTRASOUND (EUS) RADIAL;  Surgeon: [name], MD;  [facility];  Service: Endoscopy;  Laterality: N/A;  ? EYE SURGERY Bilateral   ? FINE NEEDLE ASPIRATION N/A 10/03/2018  ? Procedure: FINE NEEDLE ASPIRATION (FNA) LINEAR;  Surgeon: [name], MD;  [facility];  Service: Endoscopy;  Laterality: N/A;  ? IR ANGIO INTRA EXTRACRAN SEL COM CAROTID INNOMINATE BILAT MOD SED  01/21/2018  ? IR ANGIO INTRA EXTRACRAN SEL COM CAROTID INNOMINATE UNI R MOD SED  03/21/2018  ? IR ANGIO VERTEBRAL SEL VERTEBRAL BILAT MOD SED  01/21/2018  ? IR TRANSCATH EXCRAN VERT OR CAR A STENT  03/21/2018  ? LEFT HEART CATH AND CORONARY ANGIOGRAPHY N/A 01/08/2020  ? Procedure: LEFT HEART CATH AND CORONARY ANGIOGRAPHY;  Surgeon: Leonie Man, MD;  Location: [place] [facility];  Service: Cardiovascular;  Laterality: N/A;  ? PORT-A-CATH REMOVAL N/A 06/20/2021  ? Procedure: MINOR REMOVAL PORT-A-CATH;  Surgeon: Virl Cagey, MD;  [facility];  Service: General;  Laterality: N/A;  ? PORTACATH PLACEMENT Left 12/23/2018  ? Procedure: INSERTION PORT-A-CATH;  Surgeon: Virl Cagey, MD;  [facility];  Service: General;  Laterality: Left;  ? RADIOLOGY WITH ANESTHESIA N/A 03/21/2018  ? Procedure: IR WITH ANESTHESIA WITH STENT PLACEMENT;  Surgeon: Luanne Bras, MD;  Location: [place];  Service: Radiology;  Laterality: N/A;  ? ROTATOR CUFF REPAIR    ? Left  ? SPLENECTOMY    ? TEE WITHOUT CARDIOVERSION N/A 11/18/2019  ? Procedure:  TRANSESOPHAGEAL ECHOCARDIOGRAM (TEE) WITH PROPOFOL;  Surgeon: Herminio Commons, MD;  [facility];  Service: Cardiology;  Laterality: N/A;  ? TEE WITHOUT CARDIOVERSION N/A 01/26/2020  ? Procedure: TRANSESOPHAGEAL ECHOCARDIOGRAM (TEE);  Surgeon: Melrose Nakayama, MD;  Location: [facility];  Service: Open Heart Surgery;  Laterality: N/A;  ? TONSILLECTOMY    ? ?Patient Active Problem List  ? Diagnosis Date Noted  ? Bilateral carpal tunnel syndrome 10/19/2020  ? S/P CABG x 4 01/26/2020  ? Abnormal cardiac CT angiography 01/08/2020  ? Angina pectoris ([place])   ? CVA (cerebral vascular accident) ([place]) 10/08/2019  ? Injury of left ankle 03/24/2019  ? Port-A-Cath in place 12/19/2018  ? Genetic testing 11/15/2018  ? Loss of consciousness ([place]) 11/12/2018  ? Generalized weakness 11/11/2018  ? Family history of pancreatic cancer   ? Hyperglycemia 10/09/2018  ? Tobacco abuse counseling 10/09/2018  ? Pancreatic adenocarcinoma ([place]) 09/03/2018  ? H/O ETOH abuse 09/03/2018  ? Hyperbilirubinemia 09/03/2018  ? 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 Uncontrolled type 2 diabetes mellitus with hyperglycemia ([place]) 03/05/2018  ? Hypothyroidism 03/05/2018  ? Intracranial atherosclerosis 01/22/2018  ? Spinal stenosis in cervical region   ? Diabetes mellitus type 2 in nonobese [facility])   ? Left carotid artery occlusion 01/21/2018  ? Cervical stenosis of spinal canal 01/21/2018  ? Right hand weakness 01/21/2018  ? Numbness of right hand 01/21/2018  ? Essential hypertension, benign 01/21/2018  ? Mixed hyperlipidemia 01/21/2018  ? Diabetes ([place]) 01/21/2018  ? Hx of completed stroke 01/21/2018  ? Leukopenia 01/21/2018  ? Thrombocytopenia ([place]) 01/21/2018  ? L MCA Stroke-like episode ([place]) s/p IV tPA 01/19/2018  ? ? 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 ?THERAPY DIAG:  ?Difficulty in walking, not elsewhere classified ? ?Impaired mobility and  ADLs ? ?Upper extremity weakness ? ?Weakness of both lower extremities ? ?PERTINENT HISTORY: Pancreatic surgery for cancer and quadruple bypass. ? ?PRECAUTIONS: Fall ? ?SUBJECTIVE: Balance isn't doing very good. Was tired after last session. Doing ok with HEP.  ? ?PAIN:  ?Are you having pain? Yes: NPRS scale: 3/10 ?Pain location: Neck ?Pain description: Sore ?Aggravating factors: Movement ?Relieving factors: Rest ? ?OBJECTIVE:   ?  ?TODAY'S TREATMENT:  ?12/07/21: ?Nustep Level 3 5 minutes ?Armbike lvl6 1 minute forward/1 backward x 3 ?Step up 6 inch step with contralateral knee drive 2x 10 bilateral  ? ?12/05/21: ?Aerobic: ?-NuStepw/ arms lvl3 x59mn ?-Armbike lvl6 3x146m each ?Seated: ?-Leg Press(Body craft) #4 4x12 ?-Chest press(Body craft) #2 4x8 ?-Knee extension #2 1x8 and #1 2x12 ?  ?  ?PATIENT EDUCATION:  ?Education details: HEP ?Person educated: Patient ?Education method: Explanation and Handouts ?Education comprehension: verbalized understanding ?  ?  ?HOME EXERCISE PROGRAM: ?Access Code: [email or web address] ?URL: [email or web address] ?Date: 11/28/2021 ?Prepared  by: Adalberto Cole ?  ?Exercises ?- Thoracic Extension Mobilization on Foam Roll  - 3-4 x daily - 7 x weekly - 2 sets - 8 reps - 2s hold ?- Seated Knee Extension AROM  - 2-3 x daily - 7 x weekly - 2 sets - 12 reps ?- Standing Knee Flexion  - 2-3 x daily - 7 x weekly - 2 sets - 8 reps ?- Supine Bridge  - 2-3 x daily - 7 x weekly - 2 sets - 6 reps ?- Standing Hip Abduction with Counter Support  - 2-3 x daily - 7 x weekly - 2 sets - 8 reps ?- Heel Raises with Counter Support  - 2-3 x daily - 7 x weekly - 2 sets - 12 reps ?- Heel Toe Raises with Counter Support  - 2-3 x daily - 7 x weekly - 2 sets - 8 reps ?12/07/21 Step up with knee drive ? ?ASSESSMENT: ?  ?CLINICAL IMPRESSION: ?Began session on nu step for warm up and conditioning. Patient tolerating increased time on endurance machines today. Intermittent rest breaks needed for fatigue. Requires unilateral UE  support for step up exercise due to balance impairment. Patient notes 7/10 fatigue at end of session. Patient will continue to benefit from physical therapy in order to improve function and reduce impairment.  ?  ?OBJECTI

## 2021-12-14 ENCOUNTER — Ambulatory Visit (HOSPITAL_COMMUNITY): Payer: HMO | Admitting: Physical Therapy

## 2021-12-14 ENCOUNTER — Encounter (HOSPITAL_COMMUNITY): Payer: Self-pay | Admitting: Physical Therapy

## 2021-12-14 DIAGNOSIS — R262 Difficulty in walking, not elsewhere classified: Secondary | ICD-10-CM

## 2021-12-14 DIAGNOSIS — Z789 Other specified health status: Secondary | ICD-10-CM

## 2021-12-14 DIAGNOSIS — R29898 Other symptoms and signs involving the musculoskeletal system: Secondary | ICD-10-CM

## 2021-12-14 NOTE — Therapy (Signed)
?OUTPATIENT PHYSICAL THERAPY TREATMENT NOTE ? ? ?Patient Name: Mark Foster ?MRN: [id number] ?DOB:[birth year], 66 y.o., male ?Today's Date: 12/14/2021 ? ?PCP: Celene Squibb, MD ?REFERRING PROVIDER: Edger House, MD ? ? PT End of Session - 12/14/21 1040   ? ? Visit Number 4   ? Number of Visits 10   ? Date for PT Re-Evaluation 01/02/22   ? Authorization Type [employer](no auth req / copay $20)   ? Authorization Time Period 09/04/2021 - 09/03/2022   ? Authorization - Visit Number 4   ? Progress Note Due on Visit 10   ? PT Start Time 1045   ? PT Stop Time 1128   ? PT Time Calculation (min) 43 min   ? Activity Tolerance Patient tolerated treatment well;No increased pain   ? Behavior During Therapy WFL for tasks assessed/performed;Flat affect   ? ?  ?  ? ?  ? ? ?Past Medical History:  ?Diagnosis Date  ? Bilateral carpal tunnel syndrome 10/19/2020  ? CAD (coronary artery disease)   ? a. 01/2020: CABG x4 with LIMA-LAD, SVG-OM, SVG-PDA and SVG-D1  ? Carotid artery obstruction, left   ? Left ICA occlusion  ? Cervical compression fracture (HCC)   ? Cervical spinal stenosis   ? Diabetic neuropathy ([place])   ? Bilateral legs  ? Diverticulitis   ? Family history of pancreatic cancer   ? History of kidney stones   ? Hypothyroidism   ? Pancreatic cancer ([place])   ? Stenosis of left vertebral artery   ? Stroke (cerebrum) ([place]) 10/09/2019  ? Type 2 diabetes mellitus ([place])   ? ?Past Surgical History:  ?Procedure Laterality Date  ? APPENDECTOMY    ? BUBBLE STUDY N/A 11/18/2019  ? Procedure: BUBBLE STUDY;  Surgeon: Herminio Commons, MD;  [facility];  Service: Cardiology;  Laterality: N/A;  ? CARDIAC SURGERY    ? COLON RESECTION    ? For diverticulitis  ? COLON SURGERY    ? CORONARY ARTERY BYPASS GRAFT N/A 01/26/2020  ? Procedure: CORONARY ARTERY BYPASS GRAFTING (CABG) TIMES FOUR USING LEFT INTERNAL MAMMARY VEIN AND RIGHT GREATER SAPHENOUS VEIN;  Surgeon: Melrose Nakayama, MD;  Location: [place];  Service: Open Heart Surgery;   Laterality: N/A;  ? ENDOVEIN HARVEST OF GREATER SAPHENOUS VEIN Right 01/26/2020  ? Procedure: [place] Of Greater Saphenous Vein;  Surgeon: Melrose Nakayama, MD;  Location: [place];  Service: Open Heart Surgery;  Laterality: Right;  ? ESOPHAGOGASTRODUODENOSCOPY N/A 10/03/2018  ? Procedure: ESOPHAGOGASTRODUODENOSCOPY (EGD);  Surgeon: [name], MD;  Location[name];  Service: Endoscopy;  Laterality: N/A;  ? EUS N/A 10/03/2018  ? Procedure: UPPER ENDOSCOPIC ULTRASOUND (EUS) RADIAL;  Surgeon: [name], MD;  [facility];  Service: Endoscopy;  Laterality: N/A;  ? EYE SURGERY Bilateral   ? FINE NEEDLE ASPIRATION N/A 10/03/2018  ? Procedure: FINE NEEDLE ASPIRATION (FNA) LINEAR;  Surgeon: [name], MD;  [facility];  Service: Endoscopy;  Laterality: N/A;  ? IR ANGIO INTRA EXTRACRAN SEL COM CAROTID INNOMINATE BILAT MOD SED  01/21/2018  ? IR ANGIO INTRA EXTRACRAN SEL COM CAROTID INNOMINATE UNI R MOD SED  03/21/2018  ? IR ANGIO VERTEBRAL SEL VERTEBRAL BILAT MOD SED  01/21/2018  ? IR TRANSCATH EXCRAN VERT OR CAR A STENT  03/21/2018  ? LEFT HEART CATH AND CORONARY ANGIOGRAPHY N/A 01/08/2020  ? Procedure: LEFT HEART CATH AND CORONARY ANGIOGRAPHY;  Surgeon: Leonie Man, MD;  Location: [facility] [place] [facility];  Service: Cardiovascular;  Laterality: N/A;  ? PORT-A-CATH REMOVAL N/A 06/20/2021  ? Procedure: MINOR REMOVAL PORT-A-CATH;  Surgeon: Virl Cagey, MD;  [facility];  Service: General;  Laterality: N/A;  ? PORTACATH PLACEMENT Left 12/23/2018  ? Procedure: INSERTION PORT-A-CATH;  Surgeon: Virl Cagey, MD;  [facility];  Service: General;  Laterality: Left;  ? RADIOLOGY WITH ANESTHESIA N/A 03/21/2018  ? Procedure: IR WITH ANESTHESIA WITH STENT PLACEMENT;  Surgeon: Luanne Bras, MD;  Location: [place];  Service: Radiology;  Laterality: N/A;  ? ROTATOR CUFF REPAIR    ? Left  ? SPLENECTOMY    ? TEE WITHOUT CARDIOVERSION N/A 11/18/2019  ? Procedure:  TRANSESOPHAGEAL ECHOCARDIOGRAM (TEE) WITH PROPOFOL;  Surgeon: Herminio Commons, MD;  [facility];  Service: Cardiology;  Laterality: N/A;  ? TEE WITHOUT CARDIOVERSION N/A 01/26/2020  ? Procedure: TRANSESOPHAGEAL ECHOCARDIOGRAM (TEE);  Surgeon: Melrose Nakayama, MD;  Location: [place];  Service: Open Heart Surgery;  Laterality: N/A;  ? TONSILLECTOMY    ? ?Patient Active Problem List  ? Diagnosis Date Noted  ? Bilateral carpal tunnel syndrome 10/19/2020  ? S/P CABG x 4 01/26/2020  ? Abnormal cardiac CT angiography 01/08/2020  ? Angina pectoris ([place])   ? CVA (cerebral vascular accident) ([place]) 10/08/2019  ? Injury of left ankle 03/24/2019  ? Port-A-Cath in place 12/19/2018  ? Genetic testing 11/15/2018  ? Loss of consciousness ([place]) 11/12/2018  ? Generalized weakness 11/11/2018  ? Family history of pancreatic cancer   ? Hyperglycemia 10/09/2018  ? Tobacco abuse counseling 10/09/2018  ? Pancreatic adenocarcinoma ([place]) 09/03/2018  ? H/O ETOH abuse 09/03/2018  ? Hyperbilirubinemia 09/03/2018  ? 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 Uncontrolled type 2 diabetes mellitus with hyperglycemia ([place]) 03/05/2018  ? Hypothyroidism 03/05/2018  ? Intracranial atherosclerosis 01/22/2018  ? Spinal stenosis in cervical region   ? Diabetes mellitus type 2 in nonobese [facility])   ? Left carotid artery occlusion 01/21/2018  ? Cervical stenosis of spinal canal 01/21/2018  ? Right hand weakness 01/21/2018  ? Numbness of right hand 01/21/2018  ? Essential hypertension, benign 01/21/2018  ? Mixed hyperlipidemia 01/21/2018  ? Diabetes ([place]) 01/21/2018  ? Hx of completed stroke 01/21/2018  ? Leukopenia 01/21/2018  ? Thrombocytopenia ([place]) 01/21/2018  ? L MCA Stroke-like episode ([place]) s/p IV tPA 01/19/2018  ? ? 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 ?THERAPY DIAG:  ?Difficulty in walking, not elsewhere classified ? ?Impaired mobility and  ADLs ? ?Weakness of both lower extremities ? ?PERTINENT HISTORY: Pancreatic surgery for cancer and quadruple bypass. ? ?PRECAUTIONS: Fall ? ?SUBJECTIVE: Pt states that he is completing his HEP  ?PAIN:  ?Are you having pain? Yes: NPRS scale: 4/10 ?Pain location: Neck ?Pain description: Sore ?Aggravating factors: Movement ?Relieving factors: Rest ? ?OBJECTIVE:   ?  ?TODAY'S TREATMENT:   ?            12/14/2021 ?                     Standing:   ?                     Wall arch x 10, rocker board x 5 ?                     Side step with green tband  x 10  ?  Tandem stance B x 3 , marching x10, squat x 10  ?                     Sitting :  cervical and scapular retraction x 10 , W back x 10  ?                                   Cervical excursions x 3 , shoulder rolls  ?                   ?                      ?12/07/21: ?Nustep Level 3 5 minutes ?Armbike lvl6 1 minute forward/1 backward x 3 ?Step up 6 inch step with contralateral knee drive 2x 10 bilateral  ? ?12/05/21: ?Aerobic: ?-NuStepw/ arms lvl3 x27mn ?-Armbike lvl6 3x136m each ?Seated: ?-Leg Press(Body craft) #4 4x12 ?-Chest press(Body craft) #2 4x8 ?-Knee extension #2 1x8 and #1 2x12 ?  ?  ?PATIENT EDUCATION:  ?Education details: HEP ?Person educated: Patient ?Education method: Explanation and Handouts ?Education comprehension: verbalized understanding ?  ?  ?HOME EXERCISE PROGRAM: ?Access Code: [email or web address] ?URL: [email or web address] ?Date: 11/28/2021 ?Prepared by: CoAdalberto Cole  ?Exercises: ?- Thoracic Extension Mobilization on Foam Roll  - 3-4 x daily - 7 x weekly - 2 sets - 8 reps - 2s hold ?- Seated Knee Extension AROM  - 2-3 x daily - 7 x weekly - 2 sets - 12 reps ?- Standing Knee Flexion  - 2-3 x daily - 7 x weekly - 2 sets - 8 reps ?- Supine Bridge  - 2-3 x daily - 7 x weekly - 2 sets - 6 reps ?- Standing Hip Abduction with Counter Support  - 2-3 x daily - 7 x weekly - 2 sets - 8 reps ?- Heel Raises with Counter Support  - 2-3 x daily - 7  x weekly - 2 sets - 12 reps ?- Heel Toe Raises with Counter Support  - 2-3 x daily - 7 x weekly - 2 sets - 8 reps ?12/07/21 Step up with knee drive ?4/12:  cervical and scapular retraction, cervical side bend and rotat

## 2021-12-15 ENCOUNTER — Ambulatory Visit (HOSPITAL_COMMUNITY): Payer: HMO | Admitting: Physical Therapy

## 2021-12-15 ENCOUNTER — Encounter (HOSPITAL_COMMUNITY): Payer: Self-pay | Admitting: Physical Therapy

## 2021-12-15 DIAGNOSIS — R262 Difficulty in walking, not elsewhere classified: Secondary | ICD-10-CM

## 2021-12-15 DIAGNOSIS — Z7409 Other reduced mobility: Secondary | ICD-10-CM

## 2021-12-15 DIAGNOSIS — R29898 Other symptoms and signs involving the musculoskeletal system: Secondary | ICD-10-CM

## 2021-12-15 NOTE — Therapy (Signed)
?OUTPATIENT PHYSICAL THERAPY TREATMENT NOTE ? ? ?Patient Name: Mark Foster ?MRN: [id number] ?DOB:[birth year], 66 y.o., male ?Today's Date: 12/15/2021 ? ?PCP: Celene Squibb, MD ?REFERRING PROVIDER: Edger House, MD ? ? PT End of Session - 12/15/21 1102   ? ? Visit Number 5   ? Number of Visits 10   ? Date for PT Re-Evaluation 01/02/22   ? Authorization Type [employer](no auth req / copay $20)   ? Authorization Time Period 09/04/2021 - 09/03/2022   ? Authorization - Visit Number 5   ? Progress Note Due on Visit 10   ? PT Start Time 1100   ? PT Stop Time 3007   ? PT Time Calculation (min) 38 min   ? Activity Tolerance Patient tolerated treatment well;No increased pain   ? Behavior During Therapy WFL for tasks assessed/performed;Flat affect   ? ?  ?  ? ?  ? ? ?Past Medical History:  ?Diagnosis Date  ? Bilateral carpal tunnel syndrome 10/19/2020  ? CAD (coronary artery disease)   ? a. 01/2020: CABG x4 with LIMA-LAD, SVG-OM, SVG-PDA and SVG-D1  ? Carotid artery obstruction, left   ? Left ICA occlusion  ? Cervical compression fracture (HCC)   ? Cervical spinal stenosis   ? Diabetic neuropathy ([place])   ? Bilateral legs  ? Diverticulitis   ? Family history of pancreatic cancer   ? History of kidney stones   ? Hypothyroidism   ? Pancreatic cancer ([place])   ? Stenosis of left vertebral artery   ? Stroke (cerebrum) ([place]) 10/09/2019  ? Type 2 diabetes mellitus ([place])   ? ?Past Surgical History:  ?Procedure Laterality Date  ? APPENDECTOMY    ? BUBBLE STUDY N/A 11/18/2019  ? Procedure: BUBBLE STUDY;  Surgeon: Herminio Commons, MD;  [facility];  Service: Cardiology;  Laterality: N/A;  ? CARDIAC SURGERY    ? COLON RESECTION    ? For diverticulitis  ? COLON SURGERY    ? CORONARY ARTERY BYPASS GRAFT N/A 01/26/2020  ? Procedure: CORONARY ARTERY BYPASS GRAFTING (CABG) TIMES FOUR USING LEFT INTERNAL MAMMARY VEIN AND RIGHT GREATER SAPHENOUS VEIN;  Surgeon: Melrose Nakayama, MD;  Location: [place];  Service: Open Heart Surgery;   Laterality: N/A;  ? ENDOVEIN HARVEST OF GREATER SAPHENOUS VEIN Right 01/26/2020  ? Procedure: [place] Of Greater Saphenous Vein;  Surgeon: Melrose Nakayama, MD;  Location: [place];  Service: Open Heart Surgery;  Laterality: Right;  ? ESOPHAGOGASTRODUODENOSCOPY N/A 10/03/2018  ? Procedure: ESOPHAGOGASTRODUODENOSCOPY (EGD);  Surgeon: [name], MD;  Location: [name];  Service: Endoscopy;  Laterality: N/A;  ? EUS N/A 10/03/2018  ? Procedure: UPPER ENDOSCOPIC ULTRASOUND (EUS) RADIAL;  Surgeon: [name], MD;  [facility];  Service: Endoscopy;  Laterality: N/A;  ? EYE SURGERY Bilateral   ? FINE NEEDLE ASPIRATION N/A 10/03/2018  ? Procedure: FINE NEEDLE ASPIRATION (FNA) LINEAR;  Surgeon: [name], MD;  [facility];  Service: Endoscopy;  Laterality: N/A;  ? IR ANGIO INTRA EXTRACRAN SEL COM CAROTID INNOMINATE BILAT MOD SED  01/21/2018  ? IR ANGIO INTRA EXTRACRAN SEL COM CAROTID INNOMINATE UNI R MOD SED  03/21/2018  ? IR ANGIO VERTEBRAL SEL VERTEBRAL BILAT MOD SED  01/21/2018  ? IR TRANSCATH EXCRAN VERT OR CAR A STENT  03/21/2018  ? LEFT HEART CATH AND CORONARY ANGIOGRAPHY N/A 01/08/2020  ? Procedure: LEFT HEART CATH AND CORONARY ANGIOGRAPHY;  Surgeon: Leonie Man, MD;  Location: [facility];  Service: Cardiovascular;  Laterality: N/A;  ? PORT-A-CATH REMOVAL N/A 06/20/2021  ? Procedure: MINOR REMOVAL PORT-A-CATH;  Surgeon: Virl Cagey, MD;  [facility];  Service: General;  Laterality: N/A;  ? PORTACATH PLACEMENT Left 12/23/2018  ? Procedure: INSERTION PORT-A-CATH;  Surgeon: Virl Cagey, MD;  [facility];  Service: General;  Laterality: Left;  ? RADIOLOGY WITH ANESTHESIA N/A 03/21/2018  ? Procedure: IR WITH ANESTHESIA WITH STENT PLACEMENT;  Surgeon: Luanne Bras, MD;  Location: [facility];  Service: Radiology;  Laterality: N/A;  ? ROTATOR CUFF REPAIR    ? Left  ? SPLENECTOMY    ? TEE WITHOUT CARDIOVERSION N/A 11/18/2019  ? Procedure:  TRANSESOPHAGEAL ECHOCARDIOGRAM (TEE) WITH PROPOFOL;  Surgeon: Herminio Commons, MD;  [facility];  Service: Cardiology;  Laterality: N/A;  ? TEE WITHOUT CARDIOVERSION N/A 01/26/2020  ? Procedure: TRANSESOPHAGEAL ECHOCARDIOGRAM (TEE);  Surgeon: Melrose Nakayama, MD;  Location: [facility];  Service: Open Heart Surgery;  Laterality: N/A;  ? TONSILLECTOMY    ? ?Patient Active Problem List  ? Diagnosis Date Noted  ? Bilateral carpal tunnel syndrome 10/19/2020  ? S/P CABG x 4 01/26/2020  ? Abnormal cardiac CT angiography 01/08/2020  ? Angina pectoris ([place])   ? CVA (cerebral vascular accident) ([place]) 10/08/2019  ? Injury of left ankle 03/24/2019  ? Port-A-Cath in place 12/19/2018  ? Genetic testing 11/15/2018  ? Loss of consciousness ([place]) 11/12/2018  ? Generalized weakness 11/11/2018  ? Family history of pancreatic cancer   ? Hyperglycemia 10/09/2018  ? Tobacco abuse counseling 10/09/2018  ? Pancreatic adenocarcinoma ([place]) 09/03/2018  ? H/O ETOH abuse 09/03/2018  ? Hyperbilirubinemia 09/03/2018  ? 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 Uncontrolled type 2 diabetes mellitus with hyperglycemia ([place]) 03/05/2018  ? Hypothyroidism 03/05/2018  ? Intracranial atherosclerosis 01/22/2018  ? Spinal stenosis in cervical region   ? Diabetes mellitus type 2 in nonobese [facility])   ? Left carotid artery occlusion 01/21/2018  ? Cervical stenosis of spinal canal 01/21/2018  ? Right hand weakness 01/21/2018  ? Numbness of right hand 01/21/2018  ? Essential hypertension, benign 01/21/2018  ? Mixed hyperlipidemia 01/21/2018  ? Diabetes ([place]) 01/21/2018  ? Hx of completed stroke 01/21/2018  ? Leukopenia 01/21/2018  ? Thrombocytopenia ([place]) 01/21/2018  ? L MCA Stroke-like episode ([place]) s/p IV tPA 01/19/2018  ? ? 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 ?THERAPY DIAG:  ?Difficulty in walking, not elsewhere classified ? ?Impaired mobility and  ADLs ? ?Weakness of both lower extremities ? ?Upper extremity weakness ? ?PERTINENT HISTORY: Pancreatic surgery for cancer and quadruple bypass. ? ?PRECAUTIONS: Fall ? ?SUBJECTIVE: Pt states that his pain is about the same.  Continued compliance with HEP.  Reports the balance activities added last session were beneficial. ?PAIN:  ?Are you having pain? Yes: NPRS scale: 4/10 ?Pain location: Neck ?Pain description: Sore ?Aggravating factors: Movement ?Relieving factors: Rest ? ?OBJECTIVE:   ?  ?TODAY'S TREATMENT:   ?            12/15/2021 ?   Nustep 5 minutes level 3 UE/LE ?                       Standing:   ?                     Side step with GTB blue line 5RT  ?  Tandem stance bilateral  3 X 30" ?  Vectors 5X5" hold with 1 UE assist ?Marching 2x10 alternating with 1 HHA  ?Postural 3 with GTB retractions, rows and extension X10 ?                       Sitting :   Cervical excursions x 3  ? ?12/14/2021 ?                     Standing:   ?                     Wall arch x 10, rocker board x 5 ?                     Side step with green tband  x 10  ?                     Tandem stance B x 3 , marching x10, squat x 10  ?                     Sitting :  cervical and scapular retraction x 10 , W back x 10  ?                                   Cervical excursions x 3 , shoulder rolls  ?                                    ?12/07/21: ?Nustep Level 3 5 minutes ?Armbike lvl6 1 minute forward/1 backward x 3 ?Step up 6 inch step with contralateral knee drive 2x 10 bilateral  ? ?12/05/21: ?Aerobic: ?-NuStepw/ arms lvl3 x16mn ?-Armbike lvl6 3x175m each ?Seated: ?-Leg Press(Body craft) #4 4x12 ?-Chest press(Body craft) #2 4x8 ?-Knee extension #2 1x8 and #1 2x12 ?  ?  ?PATIENT EDUCATION:  ?Education details: HEP ?Person educated: Patient ?Education method: Explanation and Handouts ?Education comprehension: verbalized understanding ?  ?  ?HOME EXERCISE PROGRAM: ?Access Code: [email or web address] ?URL: [email or web address] ?Date:  11/28/2021 ?Prepared by: CoAdalberto Cole  ?Exercises: ?- Thoracic Extension Mobilization on Foam Roll  - 3-4 x daily - 7 x weekly - 2 sets - 8 reps - 2s hold ?- Seated Knee Extension AROM  - 2-3 x daily - 7 x weekly - 2

## 2021-12-20 ENCOUNTER — Ambulatory Visit (HOSPITAL_COMMUNITY): Payer: HMO | Admitting: Physical Therapy

## 2021-12-20 DIAGNOSIS — R29898 Other symptoms and signs involving the musculoskeletal system: Secondary | ICD-10-CM

## 2021-12-20 DIAGNOSIS — R262 Difficulty in walking, not elsewhere classified: Secondary | ICD-10-CM

## 2021-12-20 DIAGNOSIS — Z7409 Other reduced mobility: Secondary | ICD-10-CM

## 2021-12-20 NOTE — Therapy (Signed)
?OUTPATIENT PHYSICAL THERAPY TREATMENT NOTE ? ? ?Patient Name: Mark Foster ?MRN: [id number] ?DOB:June 05, 1956, 66 y.o., male ?Today's Date: 12/20/2021 ? ?PCP: Celene Squibb, MD ?REFERRING PROVIDER: Edger House, MD ? ? PT End of Session - 12/20/21 1012   ? ? Visit Number 6   ? Number of Visits 10   ? Date for PT Re-Evaluation 01/02/22   ? Authorization Type [employer](no auth req / copay $20)   ? Authorization Time Period 09/04/2021 - 09/03/2022   ? Authorization - Visit Number 6   ? Progress Note Due on Visit 10   ? PT Start Time 1003   ? PT Stop Time 1047   ? PT Time Calculation (min) 44 min   ? Activity Tolerance Patient tolerated treatment well;No increased pain   ? Behavior During Therapy WFL for tasks assessed/performed;Flat affect   ? ?  ?  ? ?  ? ? ?Past Medical History:  ?Diagnosis Date  ? Bilateral carpal tunnel syndrome 10/19/2020  ? CAD (coronary artery disease)   ? a. 01/2020: CABG x4 with LIMA-LAD, SVG-OM, SVG-PDA and SVG-D1  ? Carotid artery obstruction, left   ? Left ICA occlusion  ? Cervical compression fracture (HCC)   ? Cervical spinal stenosis   ? Diabetic neuropathy ([place])   ? Bilateral legs  ? Diverticulitis   ? Family history of pancreatic cancer   ? History of kidney stones   ? Hypothyroidism   ? Pancreatic cancer ([place])   ? Stenosis of left vertebral artery   ? Stroke (cerebrum) ([place]) 10/09/2019  ? Type 2 diabetes mellitus ([place])   ? ?Past Surgical History:  ?Procedure Laterality Date  ? APPENDECTOMY    ? BUBBLE STUDY N/A 11/18/2019  ? Procedure: BUBBLE STUDY;  Surgeon: Herminio Commons, MD;  [facility];  Service: Cardiology;  Laterality: N/A;  ? CARDIAC SURGERY    ? COLON RESECTION    ? For diverticulitis  ? COLON SURGERY    ? CORONARY ARTERY BYPASS GRAFT N/A 01/26/2020  ? Procedure: CORONARY ARTERY BYPASS GRAFTING (CABG) TIMES FOUR USING LEFT INTERNAL MAMMARY VEIN AND RIGHT GREATER SAPHENOUS VEIN;  Surgeon: Melrose Nakayama, MD;  Location: [place] [facility];  Service: Open Heart Surgery;   Laterality: N/A;  ? ENDOVEIN HARVEST OF GREATER SAPHENOUS VEIN Right 01/26/2020  ? Procedure: [place] Of Greater Saphenous Vein;  Surgeon: Melrose Nakayama, MD;  Location: [place];  Service: Open Heart Surgery;  Laterality: Right;  ? ESOPHAGOGASTRODUODENOSCOPY N/A 10/03/2018  ? Procedure: ESOPHAGOGASTRODUODENOSCOPY (EGD);  Surgeon: [name], MD;  Location[name];  Service: Endoscopy;  Laterality: N/A;  ? EUS N/A 10/03/2018  ? Procedure: UPPER ENDOSCOPIC ULTRASOUND (EUS) RADIAL;  Surgeon: [name], MD;  [facility];  Service: Endoscopy;  Laterality: N/A;  ? EYE SURGERY Bilateral   ? FINE NEEDLE ASPIRATION N/A 10/03/2018  ? Procedure: FINE NEEDLE ASPIRATION (FNA) LINEAR;  Surgeon: [name], MD;  [facility];  Service: Endoscopy;  Laterality: N/A;  ? IR ANGIO INTRA EXTRACRAN SEL COM CAROTID INNOMINATE BILAT MOD SED  01/21/2018  ? IR ANGIO INTRA EXTRACRAN SEL COM CAROTID INNOMINATE UNI R MOD SED  03/21/2018  ? IR ANGIO VERTEBRAL SEL VERTEBRAL BILAT MOD SED  01/21/2018  ? IR TRANSCATH EXCRAN VERT OR CAR A STENT  03/21/2018  ? LEFT HEART CATH AND CORONARY ANGIOGRAPHY N/A 01/08/2020  ? Procedure: LEFT HEART CATH AND CORONARY ANGIOGRAPHY;  Surgeon: Leonie Man, MD;  Location: [facility];  Service: Cardiovascular;  Laterality: N/A;  ? PORT-A-CATH REMOVAL N/A 06/20/2021  ? Procedure: MINOR REMOVAL PORT-A-CATH;  Surgeon: Virl Cagey, MD;  [facility];  Service: General;  Laterality: N/A;  ? PORTACATH PLACEMENT Left 12/23/2018  ? Procedure: INSERTION PORT-A-CATH;  Surgeon: Virl Cagey, MD;  [facility];  Service: General;  Laterality: Left;  ? RADIOLOGY WITH ANESTHESIA N/A 03/21/2018  ? Procedure: IR WITH ANESTHESIA WITH STENT PLACEMENT;  Surgeon: Luanne Bras, MD;  Location: [facility];  Service: Radiology;  Laterality: N/A;  ? ROTATOR CUFF REPAIR    ? Left  ? SPLENECTOMY    ? TEE WITHOUT CARDIOVERSION N/A 11/18/2019  ? Procedure:  TRANSESOPHAGEAL ECHOCARDIOGRAM (TEE) WITH PROPOFOL;  Surgeon: Herminio Commons, MD;  [facility];  Service: Cardiology;  Laterality: N/A;  ? TEE WITHOUT CARDIOVERSION N/A 01/26/2020  ? Procedure: TRANSESOPHAGEAL ECHOCARDIOGRAM (TEE);  Surgeon: Melrose Nakayama, MD;  Location: [facility];  Service: Open Heart Surgery;  Laterality: N/A;  ? TONSILLECTOMY    ? ?Patient Active Problem List  ? Diagnosis Date Noted  ? Bilateral carpal tunnel syndrome 10/19/2020  ? S/P CABG x 4 01/26/2020  ? Abnormal cardiac CT angiography 01/08/2020  ? Angina pectoris ([place])   ? CVA (cerebral vascular accident) ([place]) 10/08/2019  ? Injury of left ankle 03/24/2019  ? Port-A-Cath in place 12/19/2018  ? Genetic testing 11/15/2018  ? Loss of consciousness ([place]) 11/12/2018  ? Generalized weakness 11/11/2018  ? Family history of pancreatic cancer   ? Hyperglycemia 10/09/2018  ? Tobacco abuse counseling 10/09/2018  ? Pancreatic adenocarcinoma ([place]) 09/03/2018  ? H/O ETOH abuse 09/03/2018  ? Hyperbilirubinemia 09/03/2018  ? 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 Uncontrolled type 2 diabetes mellitus with hyperglycemia ([place]) 03/05/2018  ? Hypothyroidism 03/05/2018  ? Intracranial atherosclerosis 01/22/2018  ? Spinal stenosis in cervical region   ? Diabetes mellitus type 2 in nonobese [facility])   ? Left carotid artery occlusion 01/21/2018  ? Cervical stenosis of spinal canal 01/21/2018  ? Right hand weakness 01/21/2018  ? Numbness of right hand 01/21/2018  ? Essential hypertension, benign 01/21/2018  ? Mixed hyperlipidemia 01/21/2018  ? Diabetes ([place]) 01/21/2018  ? Hx of completed stroke 01/21/2018  ? Leukopenia 01/21/2018  ? Thrombocytopenia ([place]) 01/21/2018  ? L MCA Stroke-like episode ([place]) s/p IV tPA 01/19/2018  ? ? 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 ?THERAPY DIAG:  ?Difficulty in walking, not elsewhere classified ? ?Impaired mobility and  ADLs ? ?Weakness of both lower extremities ? ?PERTINENT HISTORY: Pancreatic surgery for cancer and quadruple bypass. ? ?PRECAUTIONS: Fall ? ?SUBJECTIVE: Pt states that his pain is up a little bit in his neck and upper back.  States he was pulling a cookstove on a handtruck up into his house and fell backward when pulling up on a platform.  States he was really sore over the weekend but better today.  Pt reported feeling his sugar was low so provided with apple juice and began to feel better. ?PAIN:  ?Are you having pain? Yes: NPRS scale: 5/10 ?Pain location: Neck ?Pain description: Sore ?Aggravating factors: Movement ?Relieving factors: Rest ? ?OBJECTIVE:   ?  ?TODAY'S TREATMENT:   ?             ?12/20/2021 ?   Nustep 5 minutes level 3 UE/LE for warm up. ?  Standing:   ?  Marching 2x10 alternating with 1 HHA  ?                     Tandem stance bilateral  3 X 30" ?  Vectors 5X5" hold with 1 UE assist ?  Lunges onto 4" step no UE assist to work on stability ?Postural 3 with GTB retractions, rows and extension X10 ?                     ?12/15/2021 ?   Nustep 5 minutes level 3 UE/LE ?                       Standing:   ?                     Side step with GTB blue line 5RT  ?                    Tandem stance bilateral  3 X 30" ?  Vectors 5X5" hold with 1 UE assist ?Marching 2x10 alternating with 1 HHA  ?Postural 3 with GTB retractions, rows and extension X10 ?                       Sitting :   Cervical excursions x 3  ? ?12/14/2021 ?                     Standing:   ?                     Wall arch x 10, rocker board x 5 ?                     Side step with green tband  x 10  ?                     Tandem stance B x 3 , marching x10, squat x 10  ?                     Sitting :  cervical and scapular retraction x 10 , W back x 10  ?                                   Cervical excursions x 3 , shoulder rolls  ?                                    ?  ?PATIENT EDUCATION:  ?Education details: HEP ?Person  educated: Patient ?Education method: Explanation and Handouts ?Education comprehension: verbalized understanding ?  ?  ?HOME EXERCISE PROGRAM: ?Access Code: [email or web address] ?URL: [email or web address] ?Date: 11/28/2021 ?Pre

## 2021-12-22 ENCOUNTER — Ambulatory Visit (HOSPITAL_COMMUNITY): Payer: HMO | Admitting: Physical Therapy

## 2021-12-22 DIAGNOSIS — R262 Difficulty in walking, not elsewhere classified: Secondary | ICD-10-CM

## 2021-12-22 DIAGNOSIS — Z7409 Other reduced mobility: Secondary | ICD-10-CM

## 2021-12-22 DIAGNOSIS — R29898 Other symptoms and signs involving the musculoskeletal system: Secondary | ICD-10-CM

## 2021-12-22 NOTE — Therapy (Signed)
?OUTPATIENT PHYSICAL THERAPY TREATMENT NOTE ? ? ?Patient Name: Mark Foster ?MRN: [id number] ?DOB:08/19/1956, 66 y.o., male ?Today's Date: 12/22/2021 ? ?PCP: Celene Squibb, MD ?REFERRING PROVIDER: Edger House, MD ? ? PT End of Session - 12/22/21 1417   ? ? Visit Number 7   ? Number of Visits 10   ? Date for PT Re-Evaluation 01/02/22   ? Authorization Type [employer](no auth req / copay $20)   ? Authorization Time Period 09/04/2021 - 09/03/2022   ? Authorization - Visit Number 6   ? Progress Note Due on Visit 10   ? PT Start Time 1402   ? PT Stop Time 5631   ? PT Time Calculation (min) 43 min   ? Activity Tolerance Patient tolerated treatment well;No increased pain   ? Behavior During Therapy WFL for tasks assessed/performed;Flat affect   ? ?  ?  ? ?  ? ? ?Past Medical History:  ?Diagnosis Date  ? Bilateral carpal tunnel syndrome 10/19/2020  ? CAD (coronary artery disease)   ? a. 01/2020: CABG x4 with LIMA-LAD, SVG-OM, SVG-PDA and SVG-D1  ? Carotid artery obstruction, left   ? Left ICA occlusion  ? Cervical compression fracture (HCC)   ? Cervical spinal stenosis   ? Diabetic neuropathy ([place])   ? Bilateral legs  ? Diverticulitis   ? Family history of pancreatic cancer   ? History of kidney stones   ? Hypothyroidism   ? Pancreatic cancer ([place])   ? Stenosis of left vertebral artery   ? Stroke (cerebrum) ([place]) 10/09/2019  ? Type 2 diabetes mellitus ([place])   ? ?Past Surgical History:  ?Procedure Laterality Date  ? APPENDECTOMY    ? BUBBLE STUDY N/A 11/18/2019  ? Procedure: BUBBLE STUDY;  Surgeon: Herminio Commons, MD;  [facility]: [facility];  Service: Cardiology;  Laterality: N/A;  ? CARDIAC SURGERY    ? COLON RESECTION    ? For diverticulitis  ? COLON SURGERY    ? CORONARY ARTERY BYPASS GRAFT N/A 01/26/2020  ? Procedure: CORONARY ARTERY BYPASS GRAFTING (CABG) TIMES FOUR USING LEFT INTERNAL MAMMARY VEIN AND RIGHT GREATER SAPHENOUS VEIN;  Surgeon: Melrose Nakayama, MD;  Location: [place];  Service: Open Heart Surgery;   Laterality: N/A;  ? ENDOVEIN HARVEST OF GREATER SAPHENOUS VEIN Right 01/26/2020  ? Procedure: [place] Of Greater Saphenous Vein;  Surgeon: Melrose Nakayama, MD;  Location: [place];  Service: Open Heart Surgery;  Laterality: Right;  ? ESOPHAGOGASTRODUODENOSCOPY N/A 10/03/2018  ? Procedure: ESOPHAGOGASTRODUODENOSCOPY (EGD);  Surgeon: [name], MD;  Location[name];  Service: Endoscopy;  Laterality: N/A;  ? EUS N/A 10/03/2018  ? Procedure: UPPER ENDOSCOPIC ULTRASOUND (EUS) RADIAL;  Surgeon: [name], MD;  [facility];  Service: Endoscopy;  Laterality: N/A;  ? EYE SURGERY Bilateral   ? FINE NEEDLE ASPIRATION N/A 10/03/2018  ? Procedure: FINE NEEDLE ASPIRATION (FNA) LINEAR;  Surgeon: [name], MD;  [facility];  Service: Endoscopy;  Laterality: N/A;  ? IR ANGIO INTRA EXTRACRAN SEL COM CAROTID INNOMINATE BILAT MOD SED  01/21/2018  ? IR ANGIO INTRA EXTRACRAN SEL COM CAROTID INNOMINATE UNI R MOD SED  03/21/2018  ? IR ANGIO VERTEBRAL SEL VERTEBRAL BILAT MOD SED  01/21/2018  ? IR TRANSCATH EXCRAN VERT OR CAR A STENT  03/21/2018  ? LEFT HEART CATH AND CORONARY ANGIOGRAPHY N/A 01/08/2020  ? Procedure: LEFT HEART CATH AND CORONARY ANGIOGRAPHY;  Surgeon: Leonie Man, MD;  Location: [facility];  Service: Cardiovascular;  Laterality: N/A;  ? PORT-A-CATH REMOVAL N/A 06/20/2021  ? Procedure: MINOR REMOVAL PORT-A-CATH;  Surgeon: Virl Cagey, MD;  [facility];  Service: General;  Laterality: N/A;  ? PORTACATH PLACEMENT Left 12/23/2018  ? Procedure: INSERTION PORT-A-CATH;  Surgeon: Virl Cagey, MD;  [facility];  Service: General;  Laterality: Left;  ? RADIOLOGY WITH ANESTHESIA N/A 03/21/2018  ? Procedure: IR WITH ANESTHESIA WITH STENT PLACEMENT;  Surgeon: Luanne Bras, MD;  Location: [place];  Service: Radiology;  Laterality: N/A;  ? ROTATOR CUFF REPAIR    ? Left  ? SPLENECTOMY    ? TEE WITHOUT CARDIOVERSION N/A 11/18/2019  ? Procedure:  TRANSESOPHAGEAL ECHOCARDIOGRAM (TEE) WITH PROPOFOL;  Surgeon: Herminio Commons, MD;  [facility];  Service: Cardiology;  Laterality: N/A;  ? TEE WITHOUT CARDIOVERSION N/A 01/26/2020  ? Procedure: TRANSESOPHAGEAL ECHOCARDIOGRAM (TEE);  Surgeon: Melrose Nakayama, MD;  Location: [place];  Service: Open Heart Surgery;  Laterality: N/A;  ? TONSILLECTOMY    ? ?Patient Active Problem List  ? Diagnosis Date Noted  ? Bilateral carpal tunnel syndrome 10/19/2020  ? S/P CABG x 4 01/26/2020  ? Abnormal cardiac CT angiography 01/08/2020  ? Angina pectoris ([place])   ? CVA (cerebral vascular accident) ([place]) 10/08/2019  ? Injury of left ankle 03/24/2019  ? Port-A-Cath in place 12/19/2018  ? Genetic testing 11/15/2018  ? Loss of consciousness ([place]) 11/12/2018  ? Generalized weakness 11/11/2018  ? Family history of pancreatic cancer   ? Hyperglycemia 10/09/2018  ? Tobacco abuse counseling 10/09/2018  ? Pancreatic adenocarcinoma ([place]) 09/03/2018  ? H/O ETOH abuse 09/03/2018  ? Hyperbilirubinemia 09/03/2018  ? 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 Uncontrolled type 2 diabetes mellitus with hyperglycemia ([place]) 03/05/2018  ? Hypothyroidism 03/05/2018  ? Intracranial atherosclerosis 01/22/2018  ? Spinal stenosis in cervical region   ? Diabetes mellitus type 2 in nonobese [facility])   ? Left carotid artery occlusion 01/21/2018  ? Cervical stenosis of spinal canal 01/21/2018  ? Right hand weakness 01/21/2018  ? Numbness of right hand 01/21/2018  ? Essential hypertension, benign 01/21/2018  ? Mixed hyperlipidemia 01/21/2018  ? Diabetes ([place]) 01/21/2018  ? Hx of completed stroke 01/21/2018  ? Leukopenia 01/21/2018  ? Thrombocytopenia ([place]) 01/21/2018  ? L MCA Stroke-like episode ([place]) s/p IV tPA 01/19/2018  ? ? 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 ?THERAPY DIAG:  ?Difficulty in walking, not elsewhere classified ? ?Weakness of both lower  extremities ? ?Impaired mobility and ADLs ? ?Upper extremity weakness ? ?PERTINENT HISTORY: Pancreatic surgery for cancer and quadruple bypass. ? ?PRECAUTIONS: Fall ? ?SUBJECTIVE: Pt states that his pain is better in his neck and upper back.  States he fixed some boards on his deck today. ?PAIN:  ?Are you having pain? Yes: NPRS scale: 2/10 ?Pain location: Neck ?Pain description: Sore ?Aggravating factors: Movement ?Relieving factors: Rest ? ?OBJECTIVE:   ?  ?TODAY'S TREATMENT:   ?   12/22/2021 ?  Nustep 5 minutes level 3 UE/LE for warm up. ?                      Standing:   ?  Tandem stance bilateral  3 X 30" ?  Vectors 5X5" hold with 1 UE assist ?  Lunges onto 4" step no UE assist to work on stability 20X each ?Postural 3 with BTB retractions, rows and extension  2X10 ? ? ?  12/20/2021 ?   Nustep 5 minutes level 3 UE/LE for warm up. ?                       Standing:   ?  Marching 2x10 alternating with 1 HHA  ?                     Tandem stance bilateral  3 X 30" ?  Vectors 5X5" hold with 1 UE assist ?  Lunges onto 4" step no UE assist to work on stability ?Postural 3 with GTB retractions, rows and extension X10 ?                     ?12/15/2021 ?   Nustep 5 minutes level 3 UE/LE ?                       Standing:   ?                     Side step with GTB blue line 5RT  ?                    Tandem stance bilateral  3 X 30" ?  Vectors 5X5" hold with 1 UE assist ?Marching 2x10 alternating with 1 HHA  ?Postural 3 with GTB retractions, rows and extension X10 ?                       Sitting :   Cervical excursions x 3  ? ?12/14/2021 ?                     Standing:   ?                     Wall arch x 10, rocker board x 5 ?                     Side step with green tband  x 10  ?                     Tandem stance B x 3 , marching x10, squat x 10  ?                     Sitting :  cervical and scapular retraction x 10 , W back x 10  ?                                   Cervical excursions x 3 , shoulder rolls  ?                                     ?  ?PATIENT EDUCATION:  ?Education details: HEP ?Person educated: Patient ?Education method: Explanation and Handouts ?Education comprehension: verbalized understanding ?  ?  ?HOME EXERCISE PROGRAM: ?

## 2021-12-27 ENCOUNTER — Encounter (HOSPITAL_COMMUNITY): Payer: Self-pay | Admitting: Physical Therapy

## 2021-12-27 ENCOUNTER — Ambulatory Visit (HOSPITAL_COMMUNITY): Payer: HMO | Admitting: Physical Therapy

## 2021-12-27 DIAGNOSIS — R29898 Other symptoms and signs involving the musculoskeletal system: Secondary | ICD-10-CM

## 2021-12-27 DIAGNOSIS — R262 Difficulty in walking, not elsewhere classified: Secondary | ICD-10-CM

## 2021-12-27 NOTE — Therapy (Signed)
?OUTPATIENT PHYSICAL THERAPY TREATMENT NOTE ? ? ?Patient Name: Mark Foster ?MRN: [id number] ?DOB:Mar 19, 1956, 66 y.o., male ?Today's Date: 12/27/2021 ? ?PCP: Celene Squibb, MD ?REFERRING PROVIDER: Edger House, MD ? ? PT End of Session - 12/27/21 1031   ? ? Visit Number 8   ? Number of Visits 10   ? Date for PT Re-Evaluation 01/02/22   ? Authorization Type [employer](no auth req / copay $20)   ? Authorization Time Period 09/04/2021 - 09/03/2022   ? Authorization - Visit Number 7   ? Progress Note Due on Visit 10   ? PT Start Time 1034   ? PT Stop Time 1114   ? PT Time Calculation (min) 40 min   ? Activity Tolerance Patient tolerated treatment well;No increased pain   ? Behavior During Therapy WFL for tasks assessed/performed;Flat affect   ? ?  ?  ? ?  ? ? ?Past Medical History:  ?Diagnosis Date  ? Bilateral carpal tunnel syndrome 10/19/2020  ? CAD (coronary artery disease)   ? a. 01/2020: CABG x4 with LIMA-LAD, SVG-OM, SVG-PDA and SVG-D1  ? Carotid artery obstruction, left   ? Left ICA occlusion  ? Cervical compression fracture (HCC)   ? Cervical spinal stenosis   ? Diabetic neuropathy ([place])   ? Bilateral legs  ? Diverticulitis   ? Family history of pancreatic cancer   ? History of kidney stones   ? Hypothyroidism   ? Pancreatic cancer ([place])   ? Stenosis of left vertebral artery   ? Stroke (cerebrum) ([place]) 10/09/2019  ? Type 2 diabetes mellitus ([place])   ? ?Past Surgical History:  ?Procedure Laterality Date  ? APPENDECTOMY    ? BUBBLE STUDY N/A 11/18/2019  ? Procedure: BUBBLE STUDY;  Surgeon: Herminio Commons, MD;  [facility];  Service: Cardiology;  Laterality: N/A;  ? CARDIAC SURGERY    ? COLON RESECTION    ? For diverticulitis  ? COLON SURGERY    ? CORONARY ARTERY BYPASS GRAFT N/A 01/26/2020  ? Procedure: CORONARY ARTERY BYPASS GRAFTING (CABG) TIMES FOUR USING LEFT INTERNAL MAMMARY VEIN AND RIGHT GREATER SAPHENOUS VEIN;  Surgeon: Melrose Nakayama, MD;  Location: [place];  Service: Open Heart Surgery;   Laterality: N/A;  ? ENDOVEIN HARVEST OF GREATER SAPHENOUS VEIN Right 01/26/2020  ? Procedure: [place] Of Greater Saphenous Vein;  Surgeon: Melrose Nakayama, MD;  Location: [place];  Service: Open Heart Surgery;  Laterality: Right;  ? ESOPHAGOGASTRODUODENOSCOPY N/A 10/03/2018  ? Procedure: ESOPHAGOGASTRODUODENOSCOPY (EGD);  Surgeon: [name], MD;  [name];  Service: Endoscopy;  Laterality: N/A;  ? EUS N/A 10/03/2018  ? Procedure: UPPER ENDOSCOPIC ULTRASOUND (EUS) RADIAL;  Surgeon: [name], MD;  [facility];  Service: Endoscopy;  Laterality: N/A;  ? EYE SURGERY Bilateral   ? FINE NEEDLE ASPIRATION N/A 10/03/2018  ? Procedure: FINE NEEDLE ASPIRATION (FNA) LINEAR;  Surgeon: [name], MD;  [facility];  Service: Endoscopy;  Laterality: N/A;  ? IR ANGIO INTRA EXTRACRAN SEL COM CAROTID INNOMINATE BILAT MOD SED  01/21/2018  ? IR ANGIO INTRA EXTRACRAN SEL COM CAROTID INNOMINATE UNI R MOD SED  03/21/2018  ? IR ANGIO VERTEBRAL SEL VERTEBRAL BILAT MOD SED  01/21/2018  ? IR TRANSCATH EXCRAN VERT OR CAR A STENT  03/21/2018  ? LEFT HEART CATH AND CORONARY ANGIOGRAPHY N/A 01/08/2020  ? Procedure: LEFT HEART CATH AND CORONARY ANGIOGRAPHY;  Surgeon: Leonie Man, MD;  Location: [facility];  Service: Cardiovascular;  Laterality: N/A;  ? PORT-A-CATH REMOVAL N/A 06/20/2021  ? Procedure: MINOR REMOVAL PORT-A-CATH;  Surgeon: Virl Cagey, MD;  [facility];  Service: General;  Laterality: N/A;  ? PORTACATH PLACEMENT Left 12/23/2018  ? Procedure: INSERTION PORT-A-CATH;  Surgeon: Virl Cagey, MD;  [facility];  Service: General;  Laterality: Left;  ? RADIOLOGY WITH ANESTHESIA N/A 03/21/2018  ? Procedure: IR WITH ANESTHESIA WITH STENT PLACEMENT;  Surgeon: Luanne Bras, MD;  Location: [place];  Service: Radiology;  Laterality: N/A;  ? ROTATOR CUFF REPAIR    ? Left  ? SPLENECTOMY    ? TEE WITHOUT CARDIOVERSION N/A 11/18/2019  ? Procedure:  TRANSESOPHAGEAL ECHOCARDIOGRAM (TEE) WITH PROPOFOL;  Surgeon: Herminio Commons, MD;  [facility];  Service: Cardiology;  Laterality: N/A;  ? TEE WITHOUT CARDIOVERSION N/A 01/26/2020  ? Procedure: TRANSESOPHAGEAL ECHOCARDIOGRAM (TEE);  Surgeon: Melrose Nakayama, MD;  Location: [place];  Service: Open Heart Surgery;  Laterality: N/A;  ? TONSILLECTOMY    ? ?Patient Active Problem List  ? Diagnosis Date Noted  ? Bilateral carpal tunnel syndrome 10/19/2020  ? S/P CABG x 4 01/26/2020  ? Abnormal cardiac CT angiography 01/08/2020  ? Angina pectoris ([place])   ? CVA (cerebral vascular accident) ([place]) 10/08/2019  ? Injury of left ankle 03/24/2019  ? Port-A-Cath in place 12/19/2018  ? Genetic testing 11/15/2018  ? Loss of consciousness ([place]) 11/12/2018  ? Generalized weakness 11/11/2018  ? Family history of pancreatic cancer   ? Hyperglycemia 10/09/2018  ? Tobacco abuse counseling 10/09/2018  ? Pancreatic adenocarcinoma ([place]) 09/03/2018  ? H/O ETOH abuse 09/03/2018  ? Hyperbilirubinemia 09/03/2018  ? 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 Uncontrolled type 2 diabetes mellitus with hyperglycemia ([place]) 03/05/2018  ? Hypothyroidism 03/05/2018  ? Intracranial atherosclerosis 01/22/2018  ? Spinal stenosis in cervical region   ? Diabetes mellitus type 2 in nonobese [facility])   ? Left carotid artery occlusion 01/21/2018  ? Cervical stenosis of spinal canal 01/21/2018  ? Right hand weakness 01/21/2018  ? Numbness of right hand 01/21/2018  ? Essential hypertension, benign 01/21/2018  ? Mixed hyperlipidemia 01/21/2018  ? Diabetes ([place]) 01/21/2018  ? Hx of completed stroke 01/21/2018  ? Leukopenia 01/21/2018  ? Thrombocytopenia ([place]) 01/21/2018  ? L MCA Stroke-like episode ([place]) s/p IV tPA 01/19/2018  ? ? 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 ?THERAPY DIAG:  ?Difficulty in walking, not elsewhere classified ? ?Weakness of both lower  extremities ? ?Upper extremity weakness ? ?PERTINENT HISTORY: Pancreatic surgery for cancer and quadruple bypass. ? ?PRECAUTIONS: Fall ? ?SUBJECTIVE: Patient says he is doing ok overall. Still having neck trouble and some occasional problems with balance.  ? ?PAIN:  ?Are you having pain? Yes: NPRS scale: 4/10 ?Pain location: Neck ?Pain description: Sore ?Aggravating factors: Movement ?Relieving factors: Rest ? ?OBJECTIVE:   ?  ?TODAY'S TREATMENT:   ?12/27/21 ? Nustep lv 3 5 min warmup for strength and conditioning  ? Heel raises x 20 ?Marching 3 lb x30 ?Sidestepping with 3 lb 5 RT in bars  ?Step up on 4 inch box (alternating) with 3 lb DB curls x10 each  ?BTB rows/ extension 2 x 10 each  ? ?   ? ?12/22/2021 ?  Nustep 5 minutes level 3 UE/LE for warm up. ?                      Standing:   ?  Tandem stance bilateral  3 X 30" ?  Vectors 5X5" hold with 1 UE assist ?  Lunges onto 4" step no UE assist to work on stability 20X each ?Postural 3 with BTB retractions, rows and extension  2X10 ? ? ?12/20/2021 ?   Nustep 5 minutes level 3 UE/LE for warm up. ?                       Standing:   ?  Marching 2x10 alternating with 1 HHA  ?                     Tandem stance bilateral  3 X 30" ?  Vectors 5X5" hold with 1 UE assist ?  Lunges onto 4" step no UE assist to work on stability ?Postural 3 with GTB retractions, rows and extension X10 ?                     ? ?  ?PATIENT EDUCATION:  ?Education details: Exercise form and function  ?Person educated: Patient ?Education method: Explanation and Handouts ?Education comprehension: verbalized understanding ?  ?  ?HOME EXERCISE PROGRAM: ?Access Code: [email or web address] ?Date: 11/28/2021 thoracic extension on foam roll, seated knee ext, standing knee flexion, supine bridge, standing hip abd, HR/RT ?4/5 : Step up with knee drive ?4/12:  cervical and scapular retraction, cervical side bend and rotation  ? ?ASSESSMENT: ?  ?CLINICAL IMPRESSION:   Continued with functional strengthening activity . Added  alternating step ups with dumbbell curls. Patient did well with this but does not increased fatigue throughout session. Patient given several rest breaks due to increased fatigue. Also added 3 lb ankle weights with s

## 2021-12-29 ENCOUNTER — Ambulatory Visit (HOSPITAL_COMMUNITY): Payer: HMO | Admitting: Physical Therapy

## 2021-12-29 DIAGNOSIS — R262 Difficulty in walking, not elsewhere classified: Secondary | ICD-10-CM

## 2021-12-29 DIAGNOSIS — Z7409 Other reduced mobility: Secondary | ICD-10-CM

## 2021-12-29 DIAGNOSIS — R29898 Other symptoms and signs involving the musculoskeletal system: Secondary | ICD-10-CM

## 2021-12-29 NOTE — Therapy (Signed)
?OUTPATIENT PHYSICAL THERAPY TREATMENT NOTE ? ? ?Patient Name: Mark Foster ?MRN: [id number] ?DOB:March 29, 1956, 66 y.o., male ?Today's Date: 12/29/2021 ? ?PCP: Celene Squibb, MD ?REFERRING PROVIDER: Edger House, MD ? ? PT End of Session - 12/29/21 1131   ? ? Visit Number 9   ? Number of Visits 10   ? Date for PT Re-Evaluation 01/02/22   ? Authorization Type [employer](no auth req / copay $20)   ? Authorization Time Period 09/04/2021 - 09/03/2022   ? Authorization - Visit Number 9   ? Progress Note Due on Visit 10   ? PT Start Time 1125   ? PT Stop Time 1210   ? PT Time Calculation (min) 45 min   ? Activity Tolerance Patient tolerated treatment well;No increased pain   ? Behavior During Therapy WFL for tasks assessed/performed;Flat affect   ? ?  ?  ? ?  ? ? ?Past Medical History:  ?Diagnosis Date  ? Bilateral carpal tunnel syndrome 10/19/2020  ? CAD (coronary artery disease)   ? a. 01/2020: CABG x4 with LIMA-LAD, SVG-OM, SVG-PDA and SVG-D1  ? Carotid artery obstruction, left   ? Left ICA occlusion  ? Cervical compression fracture (HCC)   ? Cervical spinal stenosis   ? Diabetic neuropathy ([place])   ? Bilateral legs  ? Diverticulitis   ? Family history of pancreatic cancer   ? History of kidney stones   ? Hypothyroidism   ? Pancreatic cancer ([place])   ? Stenosis of left vertebral artery   ? Stroke (cerebrum) ([place]) 10/09/2019  ? Type 2 diabetes mellitus ([place])   ? ?Past Surgical History:  ?Procedure Laterality Date  ? APPENDECTOMY    ? BUBBLE STUDY N/A 11/18/2019  ? Procedure: BUBBLE STUDY;  Surgeon: Herminio Commons, MD;  [facility];  Service: Cardiology;  Laterality: N/A;  ? CARDIAC SURGERY    ? COLON RESECTION    ? For diverticulitis  ? COLON SURGERY    ? CORONARY ARTERY BYPASS GRAFT N/A 01/26/2020  ? Procedure: CORONARY ARTERY BYPASS GRAFTING (CABG) TIMES FOUR USING LEFT INTERNAL MAMMARY VEIN AND RIGHT GREATER SAPHENOUS VEIN;  Surgeon: Melrose Nakayama, MD;  Location: [place];  Service: Open Heart Surgery;   Laterality: N/A;  ? ENDOVEIN HARVEST OF GREATER SAPHENOUS VEIN Right 01/26/2020  ? Procedure: [place] Of Greater Saphenous Vein;  Surgeon: Melrose Nakayama, MD;  Location: [place];  Service: Open Heart Surgery;  Laterality: Right;  ? ESOPHAGOGASTRODUODENOSCOPY N/A 10/03/2018  ? Procedure: ESOPHAGOGASTRODUODENOSCOPY (EGD);  Surgeon: [name], MD;  Location: [name];  Service: Endoscopy;  Laterality: N/A;  ? EUS N/A 10/03/2018  ? Procedure: UPPER ENDOSCOPIC ULTRASOUND (EUS) RADIAL;  Surgeon: [name], MD;  [facility];  Service: Endoscopy;  Laterality: N/A;  ? EYE SURGERY Bilateral   ? FINE NEEDLE ASPIRATION N/A 10/03/2018  ? Procedure: FINE NEEDLE ASPIRATION (FNA) LINEAR;  Surgeon: [name], MD;  [facility];  Service: Endoscopy;  Laterality: N/A;  ? IR ANGIO INTRA EXTRACRAN SEL COM CAROTID INNOMINATE BILAT MOD SED  01/21/2018  ? IR ANGIO INTRA EXTRACRAN SEL COM CAROTID INNOMINATE UNI R MOD SED  03/21/2018  ? IR ANGIO VERTEBRAL SEL VERTEBRAL BILAT MOD SED  01/21/2018  ? IR TRANSCATH EXCRAN VERT OR CAR A STENT  03/21/2018  ? LEFT HEART CATH AND CORONARY ANGIOGRAPHY N/A 01/08/2020  ? Procedure: LEFT HEART CATH AND CORONARY ANGIOGRAPHY;  Surgeon: Leonie Man, MD;  Location: [place] [facility];  Service: Cardiovascular;  Laterality: N/A;  ? PORT-A-CATH REMOVAL N/A 06/20/2021  ? Procedure: MINOR REMOVAL PORT-A-CATH;  Surgeon: Virl Cagey, MD;  [facility];  Service: General;  Laterality: N/A;  ? PORTACATH PLACEMENT Left 12/23/2018  ? Procedure: INSERTION PORT-A-CATH;  Surgeon: Virl Cagey, MD;  [facility];  Service: General;  Laterality: Left;  ? RADIOLOGY WITH ANESTHESIA N/A 03/21/2018  ? Procedure: IR WITH ANESTHESIA WITH STENT PLACEMENT;  Surgeon: Luanne Bras, MD;  Location: [place];  Service: Radiology;  Laterality: N/A;  ? ROTATOR CUFF REPAIR    ? Left  ? SPLENECTOMY    ? TEE WITHOUT CARDIOVERSION N/A 11/18/2019  ? Procedure:  TRANSESOPHAGEAL ECHOCARDIOGRAM (TEE) WITH PROPOFOL;  Surgeon: Herminio Commons, MD;  [facility];  Service: Cardiology;  Laterality: N/A;  ? TEE WITHOUT CARDIOVERSION N/A 01/26/2020  ? Procedure: TRANSESOPHAGEAL ECHOCARDIOGRAM (TEE);  Surgeon: Melrose Nakayama, MD;  Location: [place];  Service: Open Heart Surgery;  Laterality: N/A;  ? TONSILLECTOMY    ? ?Patient Active Problem List  ? Diagnosis Date Noted  ? Bilateral carpal tunnel syndrome 10/19/2020  ? S/P CABG x 4 01/26/2020  ? Abnormal cardiac CT angiography 01/08/2020  ? Angina pectoris ([place])   ? CVA (cerebral vascular accident) ([place]) 10/08/2019  ? Injury of left ankle 03/24/2019  ? Port-A-Cath in place 12/19/2018  ? Genetic testing 11/15/2018  ? Loss of consciousness ([place]) 11/12/2018  ? Generalized weakness 11/11/2018  ? Family history of pancreatic cancer   ? Hyperglycemia 10/09/2018  ? Tobacco abuse counseling 10/09/2018  ? Pancreatic adenocarcinoma ([place]) 09/03/2018  ? H/O ETOH abuse 09/03/2018  ? Hyperbilirubinemia 09/03/2018  ? 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 Uncontrolled type 2 diabetes mellitus with hyperglycemia ([place]) 03/05/2018  ? Hypothyroidism 03/05/2018  ? Intracranial atherosclerosis 01/22/2018  ? Spinal stenosis in cervical region   ? Diabetes mellitus type 2 in nonobese [facility])   ? Left carotid artery occlusion 01/21/2018  ? Cervical stenosis of spinal canal 01/21/2018  ? Right hand weakness 01/21/2018  ? Numbness of right hand 01/21/2018  ? Essential hypertension, benign 01/21/2018  ? Mixed hyperlipidemia 01/21/2018  ? Diabetes ([place]) 01/21/2018  ? Hx of completed stroke 01/21/2018  ? Leukopenia 01/21/2018  ? Thrombocytopenia ([place]) 01/21/2018  ? L MCA Stroke-like episode ([place]) s/p IV tPA 01/19/2018  ? ? 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 ?THERAPY DIAG:  ?Difficulty in walking, not elsewhere classified ? ?Weakness of both lower  extremities ? ?Upper extremity weakness ? ?Impaired mobility and ADLs ? ?PERTINENT HISTORY: Pancreatic surgery for cancer and quadruple bypass. ? ?PRECAUTIONS: Fall ? ?SUBJECTIVE: Patient states his back is hurting and not sure why.  States his neck is about the same at 4/10 but his back is 6/10.  ? ?PAIN:  ?Are you having pain? Yes: NPRS scale: 4/10 ?Pain location: Neck ?Pain description: Sore ?Aggravating factors: Movement ?Relieving factors: Rest ? ?OBJECTIVE:   ?  ?TODAY'S TREATMENT:   ? ?12/29/21 ?            Heel raises x 20 on incline ? Squat with 3# DB press ups 15X ?Marching 3# DB curls alternating 20X ?Sidestepping with 3# DB lateral raises on blue line 4RT ?Step up on 4 inch box (alternating) with 3# DB curls x15 each  ?BTB rows/ extension 2 x 10 each  ?  Nustep lv 3 5 min UE/LE for strength and conditioning at EOS  ? ?  12/27/21 ? Nustep lv 3 5 min warmup for strength and conditioning  ? Heel raises x 20 ?Marching 3 lb x30 ?Sidestepping with 3 lb 5 RT in bars  ?Step up on 4 inch box (alternating) with 3 lb DB curls x10 each  ?BTB rows/ extension 2 x 10 each  ? ? ?12/22/2021 ?  Nustep 5 minutes level 3 UE/LE for warm up. ?                      Standing:   ?  Tandem stance bilateral  3 X 30" ?  Vectors 5X5" hold with 1 UE assist ?  Lunges onto 4" step no UE assist to work on stability 20X each ?Postural 3 with BTB retractions, rows and extension  2X10 ? ? ?12/20/2021 ?   Nustep 5 minutes level 3 UE/LE for warm up. ?                       Standing:   ?  Marching 2x10 alternating with 1 HHA  ?                     Tandem stance bilateral  3 X 30" ?  Vectors 5X5" hold with 1 UE assist ?  Lunges onto 4" step no UE assist to work on stability ?Postural 3 with GTB retractions, rows and extension X10 ?                     ? ?  ?PATIENT EDUCATION:  ?Education details: Exercise form and function  ?Person educated: Patient ?Education method: Explanation and Handouts ?Education comprehension: verbalized understanding ?  ?   ?HOME EXERCISE PROGRAM: ?Access Code: [email or web address] ?Date: 11/28/2021 thoracic extension on foam roll, seated knee ext, standing knee flexion, supine bridge, standing hip abd, HR/RT ?4/5 : Step up with knee drive ?4/12:

## 2022-01-02 ENCOUNTER — Ambulatory Visit (HOSPITAL_COMMUNITY): Payer: HMO | Attending: Physical Medicine and Rehabilitation | Admitting: Physical Therapy

## 2022-01-02 DIAGNOSIS — E119 Type 2 diabetes mellitus without complications: Secondary | ICD-10-CM | POA: Diagnosis not present

## 2022-01-02 DIAGNOSIS — R262 Difficulty in walking, not elsewhere classified: Secondary | ICD-10-CM | POA: Insufficient documentation

## 2022-01-02 DIAGNOSIS — E782 Mixed hyperlipidemia: Secondary | ICD-10-CM | POA: Diagnosis not present

## 2022-01-02 DIAGNOSIS — R29898 Other symptoms and signs involving the musculoskeletal system: Secondary | ICD-10-CM | POA: Insufficient documentation

## 2022-01-02 DIAGNOSIS — I10 Essential (primary) hypertension: Secondary | ICD-10-CM | POA: Diagnosis not present

## 2022-01-02 NOTE — Therapy (Signed)
?OUTPATIENT PHYSICAL THERAPY TREATMENT NOTE ? ? ?Patient Name: Mark Foster ?MRN: [id number] ?DOB:[birth year], 66 y.o., male ?Today's Date: 01/02/2022 ?PHYSICAL THERAPY DISCHARGE SUMMARY ? ?Visits from Start of Care: 10 ? ?Current functional level related to goals / functional outcomes: ?See below ?  ?Remaining deficits: ?See below ?  ?Education / Equipment: ?See below  ? ?Patient agrees to discharge. Patient goals were met. Patient is being discharged due to meeting the stated rehab goals. ? ?PCP: Celene Squibb, MD ?REFERRING PROVIDER: Edger House, MD ? ? PT End of Session - 01/02/22 1033   ? ? Visit Number 10   ? Number of Visits 10   ? Date for PT Re-Evaluation 01/02/22   ? Authorization Type [employer](no auth req / copay $20)   ? Authorization Time Period 09/04/2021 - 09/03/2022   ? Authorization - Visit Number 10   ? Progress Note Due on Visit 10   ? PT Start Time 1033   ? PT Stop Time 1111   ? PT Time Calculation (min) 38 min   ? Activity Tolerance Patient tolerated treatment well   ? Behavior During Therapy [facility] for tasks assessed/performed   ? ?  ?  ? ?  ? ? ?Past Medical History:  ?Diagnosis Date  ? Bilateral carpal tunnel syndrome 10/19/2020  ? CAD (coronary artery disease)   ? a. 01/2020: CABG x4 with LIMA-LAD, SVG-OM, SVG-PDA and SVG-D1  ? Carotid artery obstruction, left   ? Left ICA occlusion  ? Cervical compression fracture (HCC)   ? Cervical spinal stenosis   ? Diabetic neuropathy ([place])   ? Bilateral legs  ? Diverticulitis   ? Family history of pancreatic cancer   ? History of kidney stones   ? Hypothyroidism   ? Pancreatic cancer ([place])   ? Stenosis of left vertebral artery   ? Stroke (cerebrum) ([place]) 10/09/2019  ? Type 2 diabetes mellitus ([place])   ? ?Past Surgical History:  ?Procedure Laterality Date  ? APPENDECTOMY    ? BUBBLE STUDY N/A 11/18/2019  ? Procedure: BUBBLE STUDY;  Surgeon: Herminio Commons, MD;  [facility];  Service: Cardiology;  Laterality: N/A;  ? CARDIAC SURGERY    ? COLON  RESECTION    ? For diverticulitis  ? COLON SURGERY    ? CORONARY ARTERY BYPASS GRAFT N/A 01/26/2020  ? Procedure: CORONARY ARTERY BYPASS GRAFTING (CABG) TIMES FOUR USING LEFT INTERNAL MAMMARY VEIN AND RIGHT GREATER SAPHENOUS VEIN;  Surgeon: Melrose Nakayama, MD;  Location: [place];  Service: Open Heart Surgery;  Laterality: N/A;  ? ENDOVEIN HARVEST OF GREATER SAPHENOUS VEIN Right 01/26/2020  ? Procedure: [place] Of Greater Saphenous Vein;  Surgeon: Melrose Nakayama, MD;  Location: [place];  Service: Open Heart Surgery;  Laterality: Right;  ? ESOPHAGOGASTRODUODENOSCOPY N/A 10/03/2018  ? Procedure: ESOPHAGOGASTRODUODENOSCOPY (EGD);  Surgeon: [name], MD;  Location[name];  Service: Endoscopy;  Laterality: N/A;  ? EUS N/A 10/03/2018  ? Procedure: UPPER ENDOSCOPIC ULTRASOUND (EUS) RADIAL;  Surgeon: [name], MD;  [facility];  Service: Endoscopy;  Laterality: N/A;  ? EYE SURGERY Bilateral   ? FINE NEEDLE ASPIRATION N/A 10/03/2018  ? Procedure: FINE NEEDLE ASPIRATION (FNA) LINEAR;  Surgeon: [name], MD;  [facility];  Service: Endoscopy;  Laterality: N/A;  ? IR ANGIO INTRA EXTRACRAN SEL COM CAROTID INNOMINATE BILAT MOD SED  01/21/2018  ? IR ANGIO INTRA EXTRACRAN SEL COM CAROTID INNOMINATE UNI R MOD SED  03/21/2018  ?  IR ANGIO VERTEBRAL SEL VERTEBRAL BILAT MOD SED  01/21/2018  ? IR TRANSCATH EXCRAN VERT OR CAR A STENT  03/21/2018  ? LEFT HEART CATH AND CORONARY ANGIOGRAPHY N/A 01/08/2020  ? Procedure: LEFT HEART CATH AND CORONARY ANGIOGRAPHY;  Surgeon: Marykay Lex, MD;  [facility]: [facility];  Service: Cardiovascular;  Laterality: N/A;  ? PORT-A-CATH REMOVAL N/A 06/20/2021  ? Procedure: MINOR REMOVAL PORT-A-CATH;  Surgeon: Lucretia Roers, MD;  [facility];  Service: General;  Laterality: N/A;  ? PORTACATH PLACEMENT Left 12/23/2018  ? Procedure: INSERTION PORT-A-CATH;  Surgeon: Lucretia Roers, MD;  [facility];  Service: General;   Laterality: Left;  ? RADIOLOGY WITH ANESTHESIA N/A 03/21/2018  ? Procedure: IR WITH ANESTHESIA WITH STENT PLACEMENT;  Surgeon: Julieanne Cotton, MD;  [facility];  Service: Radiology;  Laterality: N/A;  ? ROTATOR CUFF REPAIR    ? Left  ? SPLENECTOMY    ? TEE WITHOUT CARDIOVERSION N/A 11/18/2019  ? Procedure: TRANSESOPHAGEAL ECHOCARDIOGRAM (TEE) WITH PROPOFOL;  Surgeon: Laqueta Linden, MD;  [facility];  Service: Cardiology;  Laterality: N/A;  ? TEE WITHOUT CARDIOVERSION N/A 01/26/2020  ? Procedure: TRANSESOPHAGEAL ECHOCARDIOGRAM (TEE);  Surgeon: Loreli Slot, MD;  Location: [facility];  Service: Open Heart Surgery;  Laterality: N/A;  ? TONSILLECTOMY    ? ?Patient Active Problem List  ? Diagnosis Date Noted  ? Bilateral carpal tunnel syndrome 10/19/2020  ? S/P CABG x 4 01/26/2020  ? Abnormal cardiac CT angiography 01/08/2020  ? Angina pectoris (HCC)   ? CVA (cerebral vascular accident) (HCC) 10/08/2019  ? Injury of left ankle 03/24/2019  ? Port-A-Cath in place 12/19/2018  ? Genetic testing 11/15/2018  ? Loss of consciousness (HCC) 11/12/2018  ? Generalized weakness 11/11/2018  ? Family history of pancreatic cancer   ? Hyperglycemia 10/09/2018  ? Tobacco abuse counseling 10/09/2018  ? Pancreatic adenocarcinoma (HCC) 09/03/2018  ? H/O ETOH abuse 09/03/2018  ? Hyperbilirubinemia 09/03/2018  ? 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 Uncontrolled type 2 diabetes mellitus with hyperglycemia (HCC) 03/05/2018  ? Hypothyroidism 03/05/2018  ? Intracranial atherosclerosis 01/22/2018  ? Spinal stenosis in cervical region   ? Diabetes mellitus type 2 in nonobese [facility])   ? Left carotid artery occlusion 01/21/2018  ? Cervical stenosis of spinal canal 01/21/2018  ? Right hand weakness 01/21/2018  ? Numbness of right hand 01/21/2018  ? Essential hypertension, benign 01/21/2018  ? Mixed hyperlipidemia 01/21/2018  ? Diabetes (HCC) 01/21/2018  ? Hx of completed stroke 01/21/2018   ? Leukopenia 01/21/2018  ? Thrombocytopenia (HCC) 01/21/2018  ? L MCA Stroke-like episode (HCC) s/p IV tPA 01/19/2018  ? ? 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 ?THERAPY DIAG:  ?Difficulty in walking, not elsewhere classified ? ?Weakness of both lower extremities ? ?Upper extremity weakness ? ?PERTINENT HISTORY: Pancreatic surgery for cancer and quadruple bypass. ? ?PRECAUTIONS: Fall ? ?SUBJECTIVE: "Some days are good, some days are not". Feels therapy has helped a little. Not sure if needing to continue.  ? ?PAIN:  ?Are you having pain? Yes: NPRS scale: 2/10 ?Pain location: low back  ?Pain description: Sore ?Aggravating factors: Movement ?Relieving factors: Rest ? ?OBJECTIVE:   ?  ?TODAY'S TREATMENT:   ? ?01/02/22 ?Reassessment  ?STS x 5: 16 sec no UE ?2 MWT 327 feet no AD ?TUG: 13.7 sec no AD ? ?MMT Right ?11/28/2021 Right ?01/02/2022 Left ?11/28/2021 Left ?01/02/2022  ?Shoulder flexion  4+   4+  ?Shoulder extension        ?Shoulder abduction 4- $RemoveBefo'5 4 5  '[email or web address]$ ?Shoulder adduction        ?Shoulder extension        ?Shoulder internal rotation   5   5  ?Shoulder external rotation   4+   4+  ?Middle trapezius        ?Lower trapezius        ?Elbow flexion $RemoveBefore'5 5 5 5  '[id number]$ ?Elbow extension 4+ 5 4- 4+  ?Wrist flexion 4+ $RemoveBe'5 4 5  '[email or web address]$ ?Wrist extension 4 4+ 4- 4+  ?Wrist ulnar deviation        ?Wrist radial deviation        ?Wrist pronation        ?Wrist supination        ?Grip strength 49 / 58 95lb 46 / 54 80lb   ? (Blank rows = not tested) ? ? ? ?12/29/21 ?            Heel raises x 20 on incline ? Squat with 3# DB press ups 15X ?Marching 3# DB curls alternating 20X ?Sidestepping with 3# DB lateral raises on blue line 4RT ?Step up on 4 inch box (alternating) with 3# DB curls x15 each  ?BTB rows/ extension 2 x 10 each  ?  Nustep lv 3 5 min UE/LE for strength and conditioning at EOS  ? ?12/27/21 ? Nustep lv 3 5 min warmup for strength and conditioning  ? Heel raises x 20 ?Marching 3 lb  x30 ?Sidestepping with 3 lb 5 RT in bars  ?Step up on 4 inch box (alternating) with 3 lb DB curls x10 each  ?BTB rows/ extension 2 x 10 each  ? ? ?12/22/2021 ?  Nustep 5 minutes level 3 UE/LE for warm up. ?

## 2022-01-03 DIAGNOSIS — E782 Mixed hyperlipidemia: Secondary | ICD-10-CM | POA: Diagnosis not present

## 2022-01-03 DIAGNOSIS — K219 Gastro-esophageal reflux disease without esophagitis: Secondary | ICD-10-CM | POA: Diagnosis not present

## 2022-01-03 DIAGNOSIS — G40309 Generalized idiopathic epilepsy and epileptic syndromes, not intractable, without status epilepticus: Secondary | ICD-10-CM | POA: Diagnosis not present

## 2022-01-03 DIAGNOSIS — N400 Enlarged prostate without lower urinary tract symptoms: Secondary | ICD-10-CM | POA: Diagnosis not present

## 2022-01-03 DIAGNOSIS — C259 Malignant neoplasm of pancreas, unspecified: Secondary | ICD-10-CM | POA: Diagnosis not present

## 2022-01-03 DIAGNOSIS — E119 Type 2 diabetes mellitus without complications: Secondary | ICD-10-CM | POA: Diagnosis not present

## 2022-01-03 DIAGNOSIS — I2510 Atherosclerotic heart disease of native coronary artery without angina pectoris: Secondary | ICD-10-CM | POA: Diagnosis not present

## 2022-01-03 DIAGNOSIS — E039 Hypothyroidism, unspecified: Secondary | ICD-10-CM | POA: Diagnosis not present

## 2022-01-03 DIAGNOSIS — E669 Obesity, unspecified: Secondary | ICD-10-CM | POA: Diagnosis not present

## 2022-01-03 DIAGNOSIS — M4802 Spinal stenosis, cervical region: Secondary | ICD-10-CM | POA: Diagnosis not present

## 2022-01-03 DIAGNOSIS — M545 Low back pain, unspecified: Secondary | ICD-10-CM | POA: Diagnosis not present

## 2022-01-03 DIAGNOSIS — I10 Essential (primary) hypertension: Secondary | ICD-10-CM | POA: Diagnosis not present

## 2022-01-04 ENCOUNTER — Encounter (HOSPITAL_COMMUNITY): Payer: HMO | Admitting: Physical Therapy

## 2022-01-20 DIAGNOSIS — J069 Acute upper respiratory infection, unspecified: Secondary | ICD-10-CM | POA: Diagnosis not present

## 2022-02-11 ENCOUNTER — Other Ambulatory Visit: Payer: Self-pay | Admitting: Nurse Practitioner

## 2022-02-28 ENCOUNTER — Other Ambulatory Visit (HOSPITAL_COMMUNITY): Payer: Self-pay | Admitting: Interventional Radiology

## 2022-02-28 ENCOUNTER — Telehealth (HOSPITAL_COMMUNITY): Payer: Self-pay

## 2022-02-28 DIAGNOSIS — I771 Stricture of artery: Secondary | ICD-10-CM

## 2022-02-28 NOTE — Telephone Encounter (Signed)
Called to schedule cta head/neck, no answer, no vm. AW 

## 2022-04-07 DIAGNOSIS — E782 Mixed hyperlipidemia: Secondary | ICD-10-CM | POA: Diagnosis not present

## 2022-04-07 DIAGNOSIS — Z125 Encounter for screening for malignant neoplasm of prostate: Secondary | ICD-10-CM | POA: Diagnosis not present

## 2022-04-07 DIAGNOSIS — E119 Type 2 diabetes mellitus without complications: Secondary | ICD-10-CM | POA: Diagnosis not present

## 2022-04-07 DIAGNOSIS — D72819 Decreased white blood cell count, unspecified: Secondary | ICD-10-CM | POA: Diagnosis not present

## 2022-04-07 DIAGNOSIS — I10 Essential (primary) hypertension: Secondary | ICD-10-CM | POA: Diagnosis not present

## 2022-04-07 DIAGNOSIS — E039 Hypothyroidism, unspecified: Secondary | ICD-10-CM | POA: Diagnosis not present

## 2022-04-12 ENCOUNTER — Telehealth (HOSPITAL_COMMUNITY): Payer: Self-pay

## 2022-04-12 NOTE — Telephone Encounter (Signed)
Called to schedule cta head/neck, phone number in system not working. AW

## 2022-04-14 DIAGNOSIS — M545 Low back pain, unspecified: Secondary | ICD-10-CM | POA: Diagnosis not present

## 2022-04-14 DIAGNOSIS — G40309 Generalized idiopathic epilepsy and epileptic syndromes, not intractable, without status epilepticus: Secondary | ICD-10-CM | POA: Diagnosis not present

## 2022-04-14 DIAGNOSIS — I10 Essential (primary) hypertension: Secondary | ICD-10-CM | POA: Diagnosis not present

## 2022-04-14 DIAGNOSIS — M4802 Spinal stenosis, cervical region: Secondary | ICD-10-CM | POA: Diagnosis not present

## 2022-04-14 DIAGNOSIS — E119 Type 2 diabetes mellitus without complications: Secondary | ICD-10-CM | POA: Diagnosis not present

## 2022-04-14 DIAGNOSIS — C259 Malignant neoplasm of pancreas, unspecified: Secondary | ICD-10-CM | POA: Diagnosis not present

## 2022-04-14 DIAGNOSIS — E669 Obesity, unspecified: Secondary | ICD-10-CM | POA: Diagnosis not present

## 2022-04-14 DIAGNOSIS — E782 Mixed hyperlipidemia: Secondary | ICD-10-CM | POA: Diagnosis not present

## 2022-04-14 DIAGNOSIS — I2510 Atherosclerotic heart disease of native coronary artery without angina pectoris: Secondary | ICD-10-CM | POA: Diagnosis not present

## 2022-04-14 DIAGNOSIS — N400 Enlarged prostate without lower urinary tract symptoms: Secondary | ICD-10-CM | POA: Diagnosis not present

## 2022-04-14 DIAGNOSIS — E039 Hypothyroidism, unspecified: Secondary | ICD-10-CM | POA: Diagnosis not present

## 2022-04-14 DIAGNOSIS — K219 Gastro-esophageal reflux disease without esophagitis: Secondary | ICD-10-CM | POA: Diagnosis not present

## 2022-04-18 ENCOUNTER — Encounter: Payer: Self-pay | Admitting: *Deleted

## 2022-04-27 ENCOUNTER — Ambulatory Visit (HOSPITAL_COMMUNITY)
Admission: RE | Admit: 2022-04-27 | Discharge: 2022-04-27 | Disposition: A | Discharge status: Home / Self Care | Payer: HMO | Source: Ambulatory Visit | Attending: Interventional Radiology | Admitting: Interventional Radiology

## 2022-04-27 DIAGNOSIS — I771 Stricture of artery: Secondary | ICD-10-CM | POA: Diagnosis not present

## 2022-04-27 DIAGNOSIS — I6601 Occlusion and stenosis of right middle cerebral artery: Secondary | ICD-10-CM | POA: Diagnosis not present

## 2022-04-27 DIAGNOSIS — I6523 Occlusion and stenosis of bilateral carotid arteries: Secondary | ICD-10-CM | POA: Diagnosis not present

## 2022-04-27 DIAGNOSIS — Z8673 Personal history of transient ischemic attack (TIA), and cerebral infarction without residual deficits: Secondary | ICD-10-CM | POA: Diagnosis not present

## 2022-04-27 DIAGNOSIS — I651 Occlusion and stenosis of basilar artery: Secondary | ICD-10-CM | POA: Diagnosis not present

## 2022-04-27 LAB — POCT I-STAT CREATININE: Creatinine, Ser: 0.8 mg/dL (ref 0.61–1.24)

## 2022-04-27 MED ORDER — IOHEXOL 350 MG/ML SOLN
75.0000 mL | Freq: Once | INTRAVENOUS | Status: AC | PRN
  Administered 2022-04-27: 75 mL via INTRAVENOUS

## 2022-04-27 MED ORDER — SODIUM CHLORIDE (PF) 0.9 % IJ SOLN
INTRAMUSCULAR | Status: AC
  Filled 2022-04-27: qty 50

## 2022-05-01 ENCOUNTER — Telehealth (HOSPITAL_COMMUNITY): Payer: Self-pay

## 2022-05-01 NOTE — Telephone Encounter (Signed)
Pt agreed to f/u in 6 months with cta head/neck. AW 

## 2022-05-08 NOTE — Progress Notes (Signed)
Cardiology Office Note  Date: 05/09/2022   ID: Mark Foster, DOB 10/15/1955, MRN [id number]  PCP:  Celene Squibb, MD  Cardiologist:  Rozann Lesches, MD Electrophysiologist:  None   Chief Complaint  Patient presents with   Cardiac follow-up    History of Present Illness: Mark Foster is a 66 y.o. male last seen in February.  He is here today with his wife for a follow-up visit.  He reports dyspnea on exertion over the last 3 months, also a few episodes of angina requiring nitroglycerin.  This is more noticeable to him since last visit.  He reports no change in baseline cardiac regimen.  He also tells me that he is no longer being considered for cervical spine surgery following further work-up.  He continues to follow with Dr. Delton Coombes with history of pancreatic cancer.  Follow-up CT imaging is pending.  I personally reviewed his ECG today which shows normal sinus rhythm.  We also went over his medications.  Discussed arranging follow-up cardiac structural and ischemic testing.  Past Medical History:  Diagnosis Date   Bilateral carpal tunnel syndrome 10/19/2020   CAD (coronary artery disease)    a. 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   Type 2 diabetes mellitus ([place])     Past Surgical History:  Procedure Laterality Date   APPENDECTOMY     BUBBLE STUDY N/A 11/18/2019   Procedure: BUBBLE STUDY;  Surgeon: Herminio Commons, MD;  Location: [facility];  Service: Cardiology;  Laterality: N/A;   CARDIAC SURGERY     COLON RESECTION     For diverticulitis   COLON SURGERY      CORONARY ARTERY BYPASS GRAFT N/A 01/26/2020   Procedure: CORONARY ARTERY BYPASS GRAFTING (CABG) TIMES FOUR USING LEFT INTERNAL MAMMARY VEIN AND RIGHT GREATER SAPHENOUS VEIN;  Surgeon: Melrose Nakayama, MD;  Location: [facility];  Service: Open Heart Surgery;  Laterality: N/A;   ENDOVEIN HARVEST OF GREATER SAPHENOUS VEIN Right 01/26/2020   Procedure: [place] Of Greater Saphenous Vein;  Surgeon: Melrose Nakayama, MD;  Location: [facility];  Service: Open Heart Surgery;  Laterality: Right;   ESOPHAGOGASTRODUODENOSCOPY N/A 10/03/2018   Procedure: ESOPHAGOGASTRODUODENOSCOPY (EGD);  Surgeon: [name], MD;  Location: [name];  Service: Endoscopy;  Laterality: N/A;   EUS N/A 10/03/2018   Procedure: UPPER ENDOSCOPIC ULTRASOUND (EUS) RADIAL;  Surgeon: [name], MD;  Location: [facility];  Service: Endoscopy;  Laterality: N/A;   EYE SURGERY Bilateral    FINE NEEDLE ASPIRATION N/A 10/03/2018   Procedure: FINE NEEDLE ASPIRATION (FNA) LINEAR;  Surgeon: [name], MD;  Location: [facility];  Service: Endoscopy;  Laterality: N/A;   IR ANGIO INTRA EXTRACRAN SEL COM CAROTID INNOMINATE BILAT MOD SED  01/21/2018   IR ANGIO INTRA EXTRACRAN SEL COM CAROTID INNOMINATE UNI R MOD SED  03/21/2018   IR ANGIO VERTEBRAL SEL VERTEBRAL BILAT MOD SED  01/21/2018   IR TRANSCATH EXCRAN VERT OR CAR A STENT  03/21/2018   LEFT HEART CATH  AND CORONARY ANGIOGRAPHY N/A 01/08/2020   Procedure: LEFT HEART CATH AND CORONARY ANGIOGRAPHY;  Surgeon: Leonie Man, MD;  Location: [facility];  Service: Cardiovascular;  Laterality: N/A;   PORT-A-CATH REMOVAL N/A 06/20/2021   Procedure: MINOR REMOVAL PORT-A-CATH;  Surgeon: Virl Cagey, MD;  Location: [facility];  Service: General;  Laterality: N/A;   PORTACATH PLACEMENT Left 12/23/2018   Procedure: INSERTION PORT-A-CATH;  Surgeon: Virl Cagey, MD;  Location: [facility];  Service: General;  Laterality: Left;   RADIOLOGY WITH ANESTHESIA N/A 03/21/2018    Procedure: IR WITH ANESTHESIA WITH STENT PLACEMENT;  Surgeon: Luanne Bras, MD;  Location: [facility];  Service: Radiology;  Laterality: N/A;   ROTATOR CUFF REPAIR     Left   SPLENECTOMY     TEE WITHOUT CARDIOVERSION N/A 11/18/2019   Procedure: TRANSESOPHAGEAL ECHOCARDIOGRAM (TEE) WITH PROPOFOL;  Surgeon: Herminio Commons, MD;  Location: [facility];  Service: Cardiology;  Laterality: N/A;   TEE WITHOUT CARDIOVERSION N/A 01/26/2020   Procedure: TRANSESOPHAGEAL ECHOCARDIOGRAM (TEE);  Surgeon: Melrose Nakayama, MD;  Location: [facility];  Service: Open Heart Surgery;  Laterality: N/A;   TONSILLECTOMY      Current Outpatient Medications  Medication Sig Dispense Refill   aspirin EC 81 MG EC tablet Take 1 tablet (81 mg total) by mouth daily.     atorvastatin (LIPITOR) 80 MG tablet Take 1 tablet (80 mg total) by mouth daily at 6 PM. 90 tablet 1   Continuous Blood Gluc Receiver (FREESTYLE LIBRE 2 READER) DEVI As directed 1 each 0   Continuous Blood Gluc Sensor (FREESTYLE LIBRE 2 SENSOR) MISC USE AS DIRECTED TO CHECK BLOOD SUGAR, REPLACE EVERY 14 DAYS 2 each 1   ECHINACEA COMPLEX PO Take 1,000 mg by mouth daily.     ezetimibe (ZETIA) 10 MG tablet Take by mouth.     FREESTYLE PRECISION NEO TEST test strip USE TO TEST BLOOD SUGAR 4 TIMES DAILY. 100 strip 2   gabapentin (NEURONTIN) 300 MG capsule Take 1 capsule (300 mg total) by mouth 3 (three) times daily. 270 capsule 3   insulin aspart (NOVOLOG FLEXPEN) 100 UNIT/ML FlexPen Inject 14-20 Units into the skin 3 (three) times daily before meals. (Patient taking differently: Inject 18-24 Units into the skin 3 (three) times daily before meals.) 30 mL 1   Insulin Pen Needle 32G X 4 MM MISC 1 Device by Does not apply route daily. Use as directed to inject insulin four times daily 200 each 2   levothyroxine (SYNTHROID) 50 MCG tablet TAKE 1 TABLET EVERY DAY 90 tablet 1   metFORMIN (GLUCOPHAGE-XR) 500 MG 24 hr tablet Take by mouth.     metoprolol tartrate  (LOPRESSOR) 25 MG tablet Take 25 mg by mouth 2 (two) times daily.     nitroGLYCERIN (NITROSTAT) 0.4 MG SL tablet Place 1 tablet (0.4 mg total) under the tongue every 5 (five) minutes as needed for chest pain. 25 tablet 3   Omega-3 1000 MG CAPS Take 1,000 mg by mouth daily.     sertraline (ZOLOFT) 50 MG tablet TAKE 1 TABLET BY MOUTH DAILY 90 tablet 1   tamsulosin (FLOMAX) 0.4 MG CAPS capsule Take 1 capsule (0.4 mg total) by mouth daily. (Patient taking differently: Take 0.4 mg by mouth at bedtime.) 90 capsule 3   TOUJEO MAX SOLOSTAR 300 UNIT/ML Solostar Pen INJECT 60 UNITS INTO THE SKIN AT BEDTIME. 6 mL 0   traMADol (ULTRAM) 50 MG tablet Take 50 mg by mouth 4 (  four) times daily as needed for severe pain.     vitamin B-12 (CYANOCOBALAMIN) 1000 MCG tablet Take 1,000 mcg by mouth daily.     No current facility-administered medications for this visit.   Facility-Administered Medications Ordered in Other Visits  Medication Dose Route Frequency Provider Last Rate Last Admin   0.9 %  sodium chloride infusion   Intravenous Continuous Derek Jack, MD 20 mL/hr at 12/30/18 1351 New Bag at 12/30/18 1351   0.9 %  sodium chloride infusion   Intravenous Continuous Derek Jack, MD 20 mL/hr at 12/30/18 1401 New Bag at 12/30/18 1401   Allergies:  Demerol [meperidine hcl]   ROS: No palpitations or syncope.  Physical Exam: VS:  BP 122/74   Pulse 76   Ht '5\' 10"'$  (4.166 m)   Wt 220 lb 6.4 oz (100 kg)   SpO2 92%   BMI 31.62 kg/m , BMI Body mass index is 31.62 kg/m.  Wt Readings from Last 3 Encounters:  05/09/22 220 lb 6.4 oz (100 kg)  11/29/21 219 lb 8 oz (99.6 kg)  10/31/21 213 lb (96.6 kg)    General: Patient appears comfortable at rest. HEENT: Conjunctiva and lids normal. Neck: Supple, no elevated JVP or carotid bruits. Lungs: Clear to auscultation, nonlabored breathing at rest. Cardiac: Regular rate and rhythm, no S3 or significant systolic murmur. Extremities: No pitting  edema.  ECG:  An ECG dated 04/18/2021 was personally reviewed today and demonstrated:  Sinus rhythm.  Recent Labwork: 11/22/2021: ALT 22; AST 19; BUN 18; Hemoglobin 13.7; Platelets 170; Potassium 4.3; Sodium 137 04/27/2022: Creatinine, Ser 0.80     Component Value Date/Time   CHOL 111 05/17/2020 1329   TRIG 90 05/17/2020 1329   HDL 50 05/17/2020 1329   CHOLHDL 2.2 05/17/2020 1329   VLDL 15 01/06/2020 1008   LDLCALC 44 05/17/2020 1329    Other Studies Reviewed Today:  TEE 11/18/2019:  1. Left ventricular ejection fraction, by estimation, is 60 to 65%. The  left ventricle has normal function. The left ventricle has no regional  wall motion abnormalities.   2. Right ventricular systolic function is normal. The right ventricular  size is normal.   3. No left atrial/left atrial appendage thrombus was detected.   4. The mitral valve is grossly normal. No evidence of mitral valve  regurgitation.   5. The aortic valve is tricuspid. Aortic valve regurgitation is not  visualized. No aortic stenosis is present.   6. Agitated saline contrast bubble study was negative, with no evidence  of any interatrial shunt.    Carotid Dopplers and ABIs 01/22/2020: Summary:  Right Carotid: Velocities in the right ICA are consistent with a 1-39%  stenosis.   Left Carotid: Evidence consistent with a total occlusion of the left ICA.  Vertebrals: Bilateral vertebral arteries demonstrate antegrade flow.   Right ABI: Resting right ankle-brachial index is within normal range. No  evidence of significant right lower extremity arterial disease.  Left ABI: Resting left ankle-brachial index indicates mild left lower  extremity arterial disease.  Right Upper Extremity: Doppler waveform obliterate with right radial  compression. Doppler waveform obliterate with right ulnar compression.  Left Upper Extremity: Doppler waveform obliterate with left radial  compression. Doppler waveforms remain within normal limits  with left ulnar  compression.   Assessment and Plan:  1.  Multivessel CAD status post CABG in May 2021.  He reports worsening dyspnea on exertion with a few episodes of angina in the last few months.  ECG is  normal today.  Plan follow-up echocardiogram and Lexiscan Myoview on medical therapy for cardiac structural and ischemic surveillance.  2.  Mixed hyperlipidemia, on Lipitor and Zetia.  Last LDL 44.  3.  History of pancreatic cancer, keep follow-up surveillance imaging with Dr. Delton Coombes.  Medication Adjustments/Labs and Tests Ordered: Current medicines are reviewed at length with the patient today.  Concerns regarding medicines are outlined above.   Tests Ordered: Orders Placed This Encounter  Procedures   NM Myocar Multi W/Spect W/Wall Motion / EF   EKG 12-Lead   ECHOCARDIOGRAM COMPLETE    Medication Changes: No orders of the defined types were placed in this encounter.   Disposition:  Follow up  test results.  Signed, Satira Sark, MD, [facility] 05/09/2022 10:30 AM    [facility] [address], [place], [place] [address] Phone: [phone]; Fax: [phone]

## 2022-05-09 ENCOUNTER — Encounter: Payer: Self-pay | Admitting: Cardiology

## 2022-05-09 ENCOUNTER — Ambulatory Visit: Payer: HMO | Attending: Cardiology | Admitting: Cardiology

## 2022-05-09 ENCOUNTER — Telehealth: Payer: Self-pay | Admitting: Cardiology

## 2022-05-09 VITALS — BP 122/74 | HR 76 | Ht 70.0 in | Wt 220.4 lb

## 2022-05-09 DIAGNOSIS — R0609 Other forms of dyspnea: Secondary | ICD-10-CM | POA: Diagnosis not present

## 2022-05-09 DIAGNOSIS — E782 Mixed hyperlipidemia: Secondary | ICD-10-CM | POA: Diagnosis not present

## 2022-05-09 DIAGNOSIS — I25119 Atherosclerotic heart disease of native coronary artery with unspecified angina pectoris: Secondary | ICD-10-CM | POA: Diagnosis not present

## 2022-05-09 NOTE — Addendum Note (Signed)
Addended by: Sung Amabile on: 05/09/2022 11:14 AM   Modules accepted: Orders

## 2022-05-09 NOTE — Patient Instructions (Addendum)
Medication Instructions:  Your physician recommends that you continue on your current medications as directed. Please refer to the Current Medication list given to you today.   Labwork: none  Testing/Procedures: Your physician has requested that you have en exercise stress myoview. For further information please visit [email or web address]. Please follow instruction sheet, as given.  Your physician has requested that you have an echocardiogram. Echocardiography is a painless test that uses sound waves to create images of your heart. It provides your doctor with information about the size and shape of your heart and how well your heart's chambers and valves are working. This procedure takes approximately one hour. There are no restrictions for this procedure.   Follow-Up:  Your physician recommends that you schedule a follow-up appointment in: Follow Up Pending  Any Other Special Instructions Will Be Listed Below (If Applicable).  If you need a refill on your cardiac medications before your next appointment, please call your pharmacy.

## 2022-05-09 NOTE — Telephone Encounter (Signed)
Checking percert on the following patient for testing scheduled at [facility].     EXERCISE STRESS - 05-16-2022

## 2022-05-10 ENCOUNTER — Ambulatory Visit: Payer: HMO | Attending: Cardiology

## 2022-05-10 DIAGNOSIS — I25119 Atherosclerotic heart disease of native coronary artery with unspecified angina pectoris: Secondary | ICD-10-CM | POA: Diagnosis not present

## 2022-05-10 DIAGNOSIS — R0609 Other forms of dyspnea: Secondary | ICD-10-CM | POA: Diagnosis not present

## 2022-05-10 LAB — ECHOCARDIOGRAM COMPLETE
AR max vel: 2.38 cm2
AV Peak grad: 5.1 mmHg
Ao pk vel: 1.13 m/s
Area-P 1/2: 2.26 cm2
Calc EF: 60.6 %
S' Lateral: 2.59 cm
Single Plane A2C EF: 56.6 %
Single Plane A4C EF: 64.7 %

## 2022-05-16 ENCOUNTER — Encounter (HOSPITAL_BASED_OUTPATIENT_CLINIC_OR_DEPARTMENT_OTHER)
Admission: RE | Admit: 2022-05-16 | Discharge: 2022-05-16 | Disposition: A | Discharge status: Home / Self Care | Payer: HMO | Source: Ambulatory Visit | Attending: Cardiology | Admitting: Cardiology

## 2022-05-16 ENCOUNTER — Ambulatory Visit (HOSPITAL_COMMUNITY)
Admission: RE | Admit: 2022-05-16 | Discharge: 2022-05-16 | Disposition: A | Discharge status: Home / Self Care | Payer: HMO | Source: Ambulatory Visit | Attending: Cardiology | Admitting: Cardiology

## 2022-05-16 DIAGNOSIS — I25119 Atherosclerotic heart disease of native coronary artery with unspecified angina pectoris: Secondary | ICD-10-CM

## 2022-05-16 DIAGNOSIS — R0609 Other forms of dyspnea: Secondary | ICD-10-CM

## 2022-05-16 LAB — NM MYOCAR MULTI W/SPECT W/WALL MOTION / EF
LV dias vol: 86 mL (ref 62–150)
LV sys vol: 35 mL
Nuc Stress EF: 59 %
Peak HR: 86 {beats}/min
RATE: 0.3
Rest HR: 63 {beats}/min
Rest Nuclear Isotope Dose: 11 mCi
SDS: 0
SRS: 0
SSS: 0
ST Depression (mm): 0 mm
Stress Nuclear Isotope Dose: 33 mCi

## 2022-05-16 MED ORDER — TECHNETIUM TC 99M TETROFOSMIN IV KIT
10.0000 | PACK | Freq: Once | INTRAVENOUS | Status: AC | PRN
  Administered 2022-05-16: 11 via INTRAVENOUS

## 2022-05-16 MED ORDER — TECHNETIUM TC 99M TETROFOSMIN IV KIT
30.0000 | PACK | Freq: Once | INTRAVENOUS | Status: AC | PRN
  Administered 2022-05-16: 33 via INTRAVENOUS

## 2022-05-16 MED ORDER — REGADENOSON 0.4 MG/5ML IV SOLN
INTRAVENOUS | Status: AC
  Administered 2022-05-16: 0.4 mg via INTRAVENOUS
  Filled 2022-05-16: qty 5

## 2022-05-16 MED ORDER — SODIUM CHLORIDE FLUSH 0.9 % IV SOLN
INTRAVENOUS | Status: AC
  Administered 2022-05-16: 10 mL via INTRAVENOUS
  Filled 2022-05-16: qty 10

## 2022-05-17 ENCOUNTER — Telehealth: Payer: Self-pay | Admitting: Cardiology

## 2022-05-17 NOTE — Telephone Encounter (Signed)
Patient is returning call to discuss stress test results. 

## 2022-05-17 NOTE — Telephone Encounter (Signed)
Patient states that he is aware of his test results but wants to know why he is still continuing to have SOB. Please advise.

## 2022-05-18 NOTE — Telephone Encounter (Signed)
Patient made aware. Verbalized understanding.

## 2022-06-13 ENCOUNTER — Inpatient Hospital Stay: Payer: HMO | Attending: Hematology

## 2022-06-13 ENCOUNTER — Ambulatory Visit (HOSPITAL_COMMUNITY)
Admission: RE | Admit: 2022-06-13 | Discharge: 2022-06-13 | Disposition: A | Discharge status: Home / Self Care | Payer: HMO | Source: Ambulatory Visit | Attending: Hematology | Admitting: Hematology

## 2022-06-13 DIAGNOSIS — Z800 Family history of malignant neoplasm of digestive organs: Secondary | ICD-10-CM | POA: Insufficient documentation

## 2022-06-13 DIAGNOSIS — N3289 Other specified disorders of bladder: Secondary | ICD-10-CM | POA: Diagnosis not present

## 2022-06-13 DIAGNOSIS — C259 Malignant neoplasm of pancreas, unspecified: Secondary | ICD-10-CM

## 2022-06-13 DIAGNOSIS — E1140 Type 2 diabetes mellitus with diabetic neuropathy, unspecified: Secondary | ICD-10-CM | POA: Insufficient documentation

## 2022-06-13 DIAGNOSIS — D7589 Other specified diseases of blood and blood-forming organs: Secondary | ICD-10-CM | POA: Insufficient documentation

## 2022-06-13 DIAGNOSIS — Z9049 Acquired absence of other specified parts of digestive tract: Secondary | ICD-10-CM | POA: Insufficient documentation

## 2022-06-13 DIAGNOSIS — Z86718 Personal history of other venous thrombosis and embolism: Secondary | ICD-10-CM | POA: Insufficient documentation

## 2022-06-13 DIAGNOSIS — Z8507 Personal history of malignant neoplasm of pancreas: Secondary | ICD-10-CM | POA: Insufficient documentation

## 2022-06-13 DIAGNOSIS — G629 Polyneuropathy, unspecified: Secondary | ICD-10-CM | POA: Insufficient documentation

## 2022-06-13 DIAGNOSIS — Z9081 Acquired absence of spleen: Secondary | ICD-10-CM | POA: Insufficient documentation

## 2022-06-13 DIAGNOSIS — Z87891 Personal history of nicotine dependence: Secondary | ICD-10-CM | POA: Insufficient documentation

## 2022-06-13 LAB — CBC WITH DIFFERENTIAL/PLATELET
Abs Immature Granulocytes: 0 10*3/uL (ref 0.00–0.07)
Basophils Absolute: 0 10*3/uL (ref 0.0–0.1)
Basophils Relative: 0 %
Eosinophils Absolute: 0 10*3/uL (ref 0.0–0.5)
Eosinophils Relative: 1 %
HCT: 40.5 % (ref 39.0–52.0)
Hemoglobin: 14.1 g/dL (ref 13.0–17.0)
Immature Granulocytes: 0 %
Lymphocytes Relative: 49 %
Lymphs Abs: 2 10*3/uL (ref 0.7–4.0)
MCH: 35 pg — ABNORMAL HIGH (ref 26.0–34.0)
MCHC: 34.8 g/dL (ref 30.0–36.0)
MCV: 100.5 fL — ABNORMAL HIGH (ref 80.0–100.0)
Monocytes Absolute: 0.1 10*3/uL (ref 0.1–1.0)
Monocytes Relative: 4 %
Neutro Abs: 1.9 10*3/uL (ref 1.7–7.7)
Neutrophils Relative %: 46 %
Platelets: 120 10*3/uL — ABNORMAL LOW (ref 150–400)
RBC: 4.03 MIL/uL — ABNORMAL LOW (ref 4.22–5.81)
RDW: 16.6 % — ABNORMAL HIGH (ref 11.5–15.5)
WBC: 4 10*3/uL (ref 4.0–10.5)
nRBC: 0 % (ref 0.0–0.2)

## 2022-06-13 LAB — COMPREHENSIVE METABOLIC PANEL
ALT: 18 U/L (ref 0–44)
AST: 16 U/L (ref 15–41)
Albumin: 3.9 g/dL (ref 3.5–5.0)
Alkaline Phosphatase: 62 U/L (ref 38–126)
Anion gap: 5 (ref 5–15)
BUN: 11 mg/dL (ref 8–23)
CO2: 26 mmol/L (ref 22–32)
Calcium: 8.8 mg/dL — ABNORMAL LOW (ref 8.9–10.3)
Chloride: 105 mmol/L (ref 98–111)
Creatinine, Ser: 0.75 mg/dL (ref 0.61–1.24)
GFR, Estimated: >60 mL/min (ref >60)
Glucose, Bld: 149 mg/dL — ABNORMAL HIGH (ref 70–99)
Potassium: 4.7 mmol/L (ref 3.5–5.1)
Sodium: 136 mmol/L (ref 135–145)
Total Bilirubin: 1.3 mg/dL — ABNORMAL HIGH (ref 0.3–1.2)
Total Protein: 7.3 g/dL (ref 6.5–8.1)

## 2022-06-13 MED ORDER — IOHEXOL 300 MG/ML  SOLN
100.0000 mL | Freq: Once | INTRAMUSCULAR | Status: AC | PRN
  Administered 2022-06-13: 100 mL via INTRAVENOUS

## 2022-06-15 LAB — CANCER ANTIGEN 19-9: CA 19-9: 9 U/mL (ref 0–35)

## 2022-06-20 ENCOUNTER — Encounter: Payer: Self-pay | Admitting: Hematology

## 2022-06-20 ENCOUNTER — Inpatient Hospital Stay: Payer: HMO | Admitting: Hematology

## 2022-06-20 VITALS — BP 124/80 | HR 69 | Temp 97.6°F | Resp 18 | Ht 70.0 in | Wt 220.8 lb

## 2022-06-20 DIAGNOSIS — Z9049 Acquired absence of other specified parts of digestive tract: Secondary | ICD-10-CM | POA: Diagnosis not present

## 2022-06-20 DIAGNOSIS — Z800 Family history of malignant neoplasm of digestive organs: Secondary | ICD-10-CM | POA: Diagnosis not present

## 2022-06-20 DIAGNOSIS — E1140 Type 2 diabetes mellitus with diabetic neuropathy, unspecified: Secondary | ICD-10-CM | POA: Diagnosis not present

## 2022-06-20 DIAGNOSIS — G629 Polyneuropathy, unspecified: Secondary | ICD-10-CM | POA: Diagnosis not present

## 2022-06-20 DIAGNOSIS — Z86718 Personal history of other venous thrombosis and embolism: Secondary | ICD-10-CM | POA: Diagnosis not present

## 2022-06-20 DIAGNOSIS — D7589 Other specified diseases of blood and blood-forming organs: Secondary | ICD-10-CM | POA: Diagnosis not present

## 2022-06-20 DIAGNOSIS — Z8507 Personal history of malignant neoplasm of pancreas: Secondary | ICD-10-CM | POA: Diagnosis not present

## 2022-06-20 DIAGNOSIS — Z9081 Acquired absence of spleen: Secondary | ICD-10-CM | POA: Diagnosis not present

## 2022-06-20 DIAGNOSIS — Z87891 Personal history of nicotine dependence: Secondary | ICD-10-CM | POA: Diagnosis not present

## 2022-06-20 DIAGNOSIS — C259 Malignant neoplasm of pancreas, unspecified: Secondary | ICD-10-CM

## 2022-06-20 NOTE — Progress Notes (Signed)
[facility] [address], [place] [address]   CLINIC:  Medical Oncology/Hematology  PCP:  Celene Squibb, MD [address] [place] Cro[place] [address] [phone]   REASON FOR VISIT:  Follow-up for pancreatic adenocarcinoma  PRIOR THERAPY:  1. Distal pancreatectomy and splenectomy at [facility] on 11/20/2018. 2. Adjuvant FOLFIRINOX x 12 cycles from 12/30/2018 to 06/04/2019.  NGS Results: BRCA 1 heterozygous VUS  CURRENT THERAPY: Surveillance  BRIEF ONCOLOGIC HISTORY:  Oncology History  Pancreatic adenocarcinoma ([place])  08/20/2018 Imaging   CT abdomen/pelvis w/ contrast: IMPRESSION: Atrophy and ductal dilatation involving the pancreatic tail, with suspected small soft tissue mass in the pancreatic body which could represent pancreatic carcinoma. Abdomen MRI and MRCP without and with contrast is recommended for further evaluation.   No evidence of hepatobiliary disease.   08/30/2018 Imaging   MRI abdomen w/ contrast: IMPRESSION: 1. Although not definitive, there remains concern of a small hypoenhancing mass at the junction of the pancreatic body and tail associated with atrophy and ductal dilatation in the pancreatic tail. This remains concerning for pancreatic neoplasm. Postinflammatory stricture less likely. Besides a tiny cystic lesion in the pancreatic tail, there are no other signs of previous pancreatitis. Further evaluation with endoscopic ultrasound for possible biopsy strongly recommended. 2. No evidence of metastatic disease. 3. Cholelithiasis without evidence of cholecystitis or biliary dilatation.   09/03/2018 Initial Diagnosis   Pancreatic adenocarcinoma ([place])   10/03/2018 Procedure   EUS: - 2.6cm irregularly shaped mass in the body of the pancreas that causing main pancreatic duct obstruction and dilation. The mass abuts the splenic vessels but no other significant vascular structures and it was sampled with trangastric EUS FNA.  Preliminary cytology is + for malignancy, likely well-differentiated adenocarcinoma. It appears surgically resectable.   10/03/2018 Pathology Results   Accession: [id number]  FINE NEEDLE ASPIRATION, ENDOSCOPIC, PANCREAS BODY (SPECIMEN 1 OF 1 COLLECTED 10/03/18): ADENOCARCINOMA.   10/17/2018 Imaging   PET: IMPRESSION: 1. Tiny focus of hypermetabolism identified in the body of pancreas, adjacent to the abrupt cut off of the main pancreatic duct. No evidence for hypermetabolic metastatic disease in the neck, chest, abdomen, or pelvis. 2. Cholelithiasis. 3.  Aortic Atherosclerois (ICD10-170.0) 4. 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  The Hereditary Gene Panel offered by [facility] includes sequencing and/or deletion duplication testing of the following 47 genes: APC, ATM, AXIN2, BARD1, BMPR1A, BRCA1, BRCA2, BRIP1, CDH1, CDK4, CDKN2A (p14ARF), CDKN2A (p16INK4a), CHEK2, CTNNA1, DICER1, EPCAM (Deletion/duplication testing only), GREM1 (promoter region deletion/duplication testing only), KIT, MEN1, MLH1, MSH2, MSH3, MSH6, MUTYH, NBN, NF1, NHTL1, PALB2, PDGFRA, PMS2, POLD1, POLE, PTEN, RAD50, RAD51C, RAD51D, SDHB, SDHC, SDHD, SMAD4, SMARCA4. STK11, TP53, TSC1, TSC2, and VHL.  The following genes were evaluated for sequence changes only: SDHA and HOXB13 c.251G>A variant only. The report date is November 12, 2018.  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 The change in variant classification was made as a result of re-review of the evidence in light of new variant interpretation guidelines and/or new information. The amended report date is March 11, 2020.    11/20/2018 Surgery   Distal subtotal pancreatectomy with splenectomy at [facility]   11/20/2018 Surgery   TUMOR    Tumor Site:    Pancreatic body     Histologic Type:    Ductal adenocarcinoma     Histologic Grade:  G1: Well differentiated     Tumor Size:    Greatest dimension in Centimeters (cm): 2.8 Centimeters (cm)      Additional Dimension in Centimeters (cm):    2.7 Centimeters (cm)      Additional Dimension in Centimeters (cm):    1.8 Centimeters (cm)    Tumor Extent:          Tumor Extension:    Tumor is confined to pancreas     Accessory Findings:          Treatment Effect:    No known presurgical therapy       Lymphovascular Invasion:    Not identified       Perineural Invasion:    Not identified   MARGINS    Margins:          Proximal Pancreatic Parenchymal Margin:    Uninvolved by invasive carcinoma and pancreatic high-grade intraepithelial neoplasia         Distance of Invasive Carcinoma from Margin:    0.6 Centimeters (cm)    :          Other Margin:    Splenic margin.         Margin Status:    Uninvolved by invasive carcinoma   LYMPH NODES  Number of Lymph Nodes Involved:    2   Number of Lymph Nodes Examined:    16   PATHOLOGIC STAGE CLASSIFICATION  (pTNM, AJCC 8th Edition)  TNM Descriptors:    Not applicable   Primary Tumor (pT):    pT2   Regional Lymph Nodes (pN):    pN1   ADDITIONAL FINDINGS  Additional Pathologic Findings:    Pancreatic intraepithelial neoplasia     Highest Grade (PanIN):    High grade PanIN is present.   Additional Pathologic Findings:    Benign unilocular pancreatic cyst (1.3 cm in greatest dimension) at the pancreatic tail.   12/30/2018 - 06/06/2019 Chemotherapy   Patient is on Treatment Plan : PANCREAS FOLFIRINOX q14d       CANCER STAGING:  Cancer Staging  No matching staging information was found for the patient.  INTERVAL HISTORY:  [name], a 66 y.o. male, seen for follow-up of for pancreatic adenocarcinoma.  Reports 80 to 85% energy levels.  Reports dry cough for the last 1 month.  Numbness in the legs is slightly more than fingertips and has been stable.  REVIEW OF SYSTEMS:  Review of Systems  Constitutional:  Negative for  appetite change and fatigue.  Respiratory:  Positive for cough.   Neurological:  Positive for numbness (Legs more than hands).  All other systems reviewed and are negative.   PAST MEDICAL/SURGICAL HISTORY:  Past Medical History:  Diagnosis Date   Bilateral carpal tunnel syndrome 10/19/2020   CAD (coronary artery disease)    a. 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   Type 2 diabetes mellitus ([place])    Past Surgical History:  Procedure Laterality Date   APPENDECTOMY     BUBBLE STUDY N/A  11/18/2019   Procedure: BUBBLE STUDY;  Surgeon: Herminio Commons, MD;  Location: [facility];  Service: Cardiology;  Laterality: N/A;   CARDIAC SURGERY     COLON RESECTION     For diverticulitis   COLON SURGERY     CORONARY ARTERY BYPASS GRAFT N/A 01/26/2020   Procedure: CORONARY ARTERY BYPASS GRAFTING (CABG) TIMES FOUR USING LEFT INTERNAL MAMMARY VEIN AND RIGHT GREATER SAPHENOUS VEIN;  Surgeon: Melrose Nakayama, MD;  Location: [facility];  Service: Open Heart Surgery;  Laterality: N/A;   ENDOVEIN HARVEST OF GREATER SAPHENOUS VEIN Right 01/26/2020   Procedure: [place] Of Greater Saphenous Vein;  Surgeon: Melrose Nakayama, MD;  Location: [facility];  Service: Open Heart Surgery;  Laterality: Right;   ESOPHAGOGASTRODUODENOSCOPY N/A 10/03/2018   Procedure: ESOPHAGOGASTRODUODENOSCOPY (EGD);  Surgeon: [name], MD;  Location: [name];  Service: Endoscopy;  Laterality: N/A;   EUS N/A 10/03/2018   Procedure: UPPER ENDOSCOPIC ULTRASOUND (EUS) RADIAL;  Surgeon: [name], MD;  Location: [facility];  Service: Endoscopy;  Laterality: N/A;   EYE SURGERY Bilateral    FINE NEEDLE ASPIRATION N/A 10/03/2018   Procedure: FINE NEEDLE ASPIRATION (FNA) LINEAR;  Surgeon: [name], MD;  Location: [facility];  Service: Endoscopy;  Laterality: N/A;   IR ANGIO INTRA EXTRACRAN SEL COM CAROTID INNOMINATE BILAT MOD SED  01/21/2018   IR ANGIO INTRA EXTRACRAN SEL COM CAROTID INNOMINATE UNI R MOD SED  03/21/2018   IR ANGIO VERTEBRAL SEL VERTEBRAL BILAT MOD SED  01/21/2018   IR TRANSCATH EXCRAN VERT OR CAR A STENT  03/21/2018   LEFT HEART CATH AND CORONARY ANGIOGRAPHY N/A 01/08/2020   Procedure: LEFT HEART CATH AND CORONARY ANGIOGRAPHY;  Surgeon: Leonie Man, MD;  Location: [facility];  Service: Cardiovascular;  Laterality: N/A;   PORT-A-CATH REMOVAL N/A 06/20/2021   Procedure: MINOR REMOVAL PORT-A-CATH;  Surgeon: Virl Cagey, MD;  Location: [facility];  Service: General;  Laterality: N/A;   PORTACATH PLACEMENT Left 12/23/2018   Procedure: INSERTION PORT-A-CATH;  Surgeon: Virl Cagey, MD;  Location: [facility];  Service: General;  Laterality: Left;   RADIOLOGY WITH ANESTHESIA N/A 03/21/2018   Procedure: IR WITH ANESTHESIA WITH STENT PLACEMENT;  Surgeon: Luanne Bras, MD;  Location: [facility];  Service: Radiology;  Laterality: N/A;   ROTATOR CUFF REPAIR     Left   SPLENECTOMY     TEE WITHOUT CARDIOVERSION N/A 11/18/2019   Procedure: TRANSESOPHAGEAL ECHOCARDIOGRAM (TEE) WITH PROPOFOL;  Surgeon: Herminio Commons, MD;  [facility];  Service: Cardiology;  Laterality: N/A;   TEE WITHOUT CARDIOVERSION N/A 01/26/2020   Procedure: TRANSESOPHAGEAL ECHOCARDIOGRAM (TEE);  Surgeon: Melrose Nakayama, MD;  Location: [facility];  Service: Open Heart Surgery;  Laterality: N/A;   TONSILLECTOMY      SOCIAL HISTORY:  Social History   Socioeconomic History   Marital status: Married    Spouse name: Not on file   Number of children: 2   Years of education: Not on file   Highest  education level: Not on file  Occupational History   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  Tobacco Use   Smoking status: Former    Types: Cigarettes    Start date: 10/06/2019   Smokeless tobacco: Never  Vaping Use   Vaping Use: Never used  Substance and Sexual Activity   Alcohol use: Yes    Alcohol/week: 1.0 standard drink of alcohol    Types: 1 Cans of beer per week  Comment: rare    Drug use: Never   Sexual activity: Not on file  Other Topics Concern   Not on file  Social History Narrative   Not on file   Social Determinants of Health   Financial Resource Strain: Medium Risk (12/18/2018)   Overall Financial Resource Strain (CARDIA)    Difficulty of Paying Living Expenses: Somewhat hard  Food Insecurity: No Food Insecurity (12/18/2018)   Hunger Vital Sign    Worried About Running Out of Food in the Last Year: Never true    Ran Out of Food in the Last Year: Never true  Transportation Needs: No Transportation Needs (12/18/2018)   PRAPARE - Hydrologist (Medical): No    Lack of Transportation (Non-Medical): No  Physical Activity: Inactive (12/18/2018)   Exercise Vital Sign    Days of Exercise per Week: 0 days    Minutes of Exercise per Session: 0 min  Stress: Stress Concern Present (12/18/2018)   [place]    Feeling of Stress : To some extent  Social Connections: Somewhat Isolated (12/18/2018)   Social Connection and Isolation Panel [NHANES]    Frequency of Communication with Friends and Family: More than three times a week    Frequency of Social Gatherings with Friends and Family: Once a week    Attends Religious Services: More than 4 times per year    Active Member of [employer] or Organizations: No    Attends Archivist Meetings: Never    Marital Status: Divorced  Human resources officer Violence: Not At Risk (08/16/2020)   Humiliation, Afraid, Rape, and Kick questionnaire    Fear of  Current or Ex-Partner: No    Emotionally Abused: No    Physically Abused: No    Sexually Abused: No    FAMILY HISTORY:  Family History  Problem Relation Age of Onset   Stroke Mother    Pancreatic cancer Father 29       d. 45   Stroke Maternal Grandmother    Heart attack Maternal Grandfather    Heart Problems Paternal Grandfather    Scoliosis Daughter    Muscular dystrophy Grandson     CURRENT MEDICATIONS:  Current Outpatient Medications  Medication Sig Dispense Refill   aspirin EC 81 MG EC tablet Take 1 tablet (81 mg total) by mouth daily.     atorvastatin (LIPITOR) 80 MG tablet Take 1 tablet (80 mg total) by mouth daily at 6 PM. 90 tablet 1   Continuous Blood Gluc Receiver (FREESTYLE LIBRE 2 READER) DEVI As directed 1 each 0   Continuous Blood Gluc Sensor (FREESTYLE LIBRE 2 SENSOR) MISC USE AS DIRECTED TO CHECK BLOOD SUGAR, REPLACE EVERY 14 DAYS 2 each 1   ECHINACEA COMPLEX PO Take 1,000 mg by mouth daily.     ezetimibe (ZETIA) 10 MG tablet Take by mouth.     FREESTYLE PRECISION NEO TEST test strip USE TO TEST BLOOD SUGAR 4 TIMES DAILY. 100 strip 2   gabapentin (NEURONTIN) 300 MG capsule Take 1 capsule (300 mg total) by mouth 3 (three) times daily. 270 capsule 3   insulin aspart (NOVOLOG FLEXPEN) 100 UNIT/ML FlexPen Inject 14-20 Units into the skin 3 (three) times daily before meals. (Patient taking differently: Inject 18-24 Units into the skin 3 (three) times daily before meals.) 30 mL 1   Insulin Pen Needle 32G X 4 MM MISC 1 Device by Does not apply route daily. Use as directed  to inject insulin four times daily 200 each 2   levothyroxine (SYNTHROID) 50 MCG tablet TAKE 1 TABLET EVERY DAY 90 tablet 1   metFORMIN (GLUCOPHAGE-XR) 500 MG 24 hr tablet Take by mouth.     metoprolol tartrate (LOPRESSOR) 25 MG tablet Take 25 mg by mouth 2 (two) times daily.     nitroGLYCERIN (NITROSTAT) 0.4 MG SL tablet Place 1 tablet (0.4 mg total) under the tongue every 5 (five) minutes as needed for  chest pain. 25 tablet 3   Omega-3 1000 MG CAPS Take 1,000 mg by mouth daily.     sertraline (ZOLOFT) 50 MG tablet TAKE 1 TABLET BY MOUTH DAILY 90 tablet 1   tamsulosin (FLOMAX) 0.4 MG CAPS capsule Take 1 capsule (0.4 mg total) by mouth daily. (Patient taking differently: Take 0.4 mg by mouth at bedtime.) 90 capsule 3   TOUJEO MAX SOLOSTAR 300 UNIT/ML Solostar Pen INJECT 60 UNITS INTO THE SKIN AT BEDTIME. 6 mL 0   traMADol (ULTRAM) 50 MG tablet Take 50 mg by mouth 4 (four) times daily as needed for severe pain.     vitamin B-12 (CYANOCOBALAMIN) 1000 MCG tablet Take 1,000 mcg by mouth daily.     No current facility-administered medications for this visit.   Facility-Administered Medications Ordered in Other Visits  Medication Dose Route Frequency Provider Last Rate Last Admin   0.9 %  sodium chloride infusion   Intravenous Continuous Derek Jack, MD 20 mL/hr at 12/30/18 1351 New Bag at 12/30/18 1351   0.9 %  sodium chloride infusion   Intravenous Continuous Derek Jack, MD 20 mL/hr at 12/30/18 1401 New Bag at 12/30/18 1401    ALLERGIES:  Allergies  Allergen Reactions   Demerol [Meperidine Hcl] Other (See Comments)    convulsions    PHYSICAL EXAM:  Performance status (ECOG): 1 - Symptomatic but completely ambulatory  There were no vitals filed for this visit.  Wt Readings from Last 3 Encounters:  05/09/22 220 lb 6.4 oz (100 kg)  11/29/21 219 lb 8 oz (99.6 kg)  10/31/21 213 lb (96.6 kg)   Physical Exam Vitals reviewed.  Constitutional:      Appearance: Normal appearance. He is obese.  Cardiovascular:     Rate and Rhythm: Normal rate and regular rhythm.     Pulses: Normal pulses.     Heart sounds: Normal heart sounds.  Pulmonary:     Effort: Pulmonary effort is normal.     Breath sounds: Normal breath sounds.  Neurological:     General: No focal deficit present.     Mental Status: He is alert and oriented to person, place, and time.  Psychiatric:         Mood and Affect: Mood normal.        Behavior: Behavior normal.      LABORATORY DATA:  I have reviewed the labs as listed.     Latest Ref Rng & Units 06/13/2022   10:31 AM 11/22/2021   11:47 AM 05/18/2021    8:55 AM  CBC  WBC 4.0 - 10.5 K/uL 4.0  3.4  3.7   Hemoglobin 13.0 - 17.0 g/dL 14.1  13.7  13.4   Hematocrit 39.0 - 52.0 % 40.5  39.8  38.7   Platelets 150 - 400 K/uL 120  170  181       Latest Ref Rng & Units 06/13/2022   10:31 AM 04/27/2022    3:15 PM 11/22/2021   11:47 AM  CMP  Glucose 70 -  99 mg/dL 149   272   BUN 8 - 23 mg/dL 11   18   Creatinine 0.61 - 1.24 mg/dL 0.75  0.80  0.83   Sodium 135 - 145 mmol/L 136   137   Potassium 3.5 - 5.1 mmol/L 4.7   4.3   Chloride 98 - 111 mmol/L 105   102   CO2 22 - 32 mmol/L 26   28   Calcium 8.9 - 10.3 mg/dL 8.8   8.9   Total Protein 6.5 - 8.1 g/dL 7.3   6.8   Total Bilirubin 0.3 - 1.2 mg/dL 1.3   1.0   Alkaline Phos 38 - 126 U/L 62   66   AST 15 - 41 U/L 16   19   ALT 0 - 44 U/L 18   22     DIAGNOSTIC IMAGING:  I have independently reviewed the scans and discussed with the patient. CT Abdomen Pelvis W Contrast  Result Date: 06/14/2022 CLINICAL DATA:  Pancreatic cancer. Restaging. * Tracking Code: BO * EXAM: CT ABDOMEN AND PELVIS WITH CONTRAST TECHNIQUE: Multidetector CT imaging of the abdomen and pelvis was performed using the standard protocol following bolus administration of intravenous contrast. RADIATION DOSE REDUCTION: This exam was performed according to the departmental dose-optimization program which includes automated exposure control, adjustment of the mA and/or kV according to patient size and/or use of iterative reconstruction technique. CONTRAST:  183m OMNIPAQUE IOHEXOL 300 MG/ML  SOLN COMPARISON:  Multiple priors including most recent CT November 22, 2021. FINDINGS: Lower chest: No acute abnormality. Prior median sternotomy. Small hiatal hernia Hepatobiliary: No suspicious hepatic lesion. Cholelithiasis without  findings of acute cholecystitis. No biliary ductal dilation. Pancreas: Stable surgical changes of distal pancreatectomy and splenectomy. Unchanged size of the hypoattenuating 13 mm lesion in the uncinate process on image 37/2. No pancreatic ductal dilation. Spleen: Surgically absent. Adrenals/Urinary Tract: Bilateral adrenal glands appear normal. No hydronephrosis. Kidneys demonstrate symmetric enhancement and excretion of contrast material. Trabecular thickening of the urinary bladder wall with bladder wall diverticula. Stomach/Bowel: Radiopaque enteric contrast material traverses the descending colon. Stomach is minimally distended limiting evaluation. No pathologic dilation of small or large bowel. No evidence of acute bowel inflammation. Moderate volume of formed stool throughout the colon suggestive of constipation. Colonic diverticulosis without findings of acute diverticulitis. Vascular/Lymphatic: Aortic atherosclerosis. No pathologically enlarged abdominal or pelvic lymph nodes. Reproductive: Enlarged prostate gland. Other: Multiple fat containing supraumbilical ventral hernias are similar prior fat containing left inguinal hernia. No significant abdominopelvic free fluid. No discrete peritoneal or omental nodularity Musculoskeletal: No aggressive lytic or blastic lesion of bone. IMPRESSION: 1. Stable examination without new or progressive findings to suggest recurrent or metastatic disease. 2. Similar surgical changes of distal pancreatectomy and splenectomy with a stable 13 mm hypoattenuating lesion in the pancreatic uncinate process. Continued attention on follow-up imaging suggested. 3. Cholelithiasis without findings of acute cholecystitis. 4. Colonic diverticulosis without findings of acute diverticulitis. 5. Moderate volume of formed stool throughout the colon suggestive of constipation. 6. Multiple fat containing supraumbilical ventral hernias and left inguinal hernia, similar to prior. 7. Enlarged  prostate gland with trabeculated thickening of the urinary bladder wall and bladder wall diverticula, likely reflecting sequela of chronic outflow impedance. 8.  Aortic Atherosclerosis (ICD10-I70.0). Electronically Signed   By: JDahlia BailiffM.D.   On: 06/14/2022 14:48     ASSESSMENT:  1.  Stage IIb (T2N1) pancreatic adenocarcinoma: -[facility]. -Genetic  testing shows BRCA1 heterozygous VUS. -12 cycles of FOLFIRINOX from 12/30/2018 through 06/04/2019. -MRI of the brain on 05/19/2019 did not show any abnormalities.  This was done because of new onset numbness in the left lower lip during therapy. -CT CAP on 03/24/2020 shows unchanged low-attenuation lesion of the pancreatic uncinate measuring 1.2 x 0.8 cm.  Unchanged low-attenuation lesion near the surgical margin of the pancreatic head and neck junction measuring 1.2 x 0.9 cm.  These may reflect postoperative seroma.  Unchanged prominent retroperitoneal lymph nodes.   2.  Post splenectomy state: -He received vaccination prior to splenectomy.   PLAN:  1.  Stage IIb (T2N1) pancreatic adenocarcinoma: - He does not have any clinical signs or symptoms of recurrence. - Labs from 06/13/2022 shows normal LFTs.  CBC was grossly normal with slight macrocytosis. - CA 19-9 is normal. - CTAP from 06/13/2022 reviewed by me shows stable exam with no new progressive findings.  Other benign findings were explained to the patient. - Recommend follow-up in 6 months with repeat labs, tumor marker and CT scan.   2.  Left upper extremity DVT: - He had Port-A-Cath removed by Dr. Constance Haw.  Eliquis was discontinued at that time.   3.  Neuropathy: - He has numbness in the legs from neuropathy.  He also has numbness in the hands from cervical stenosis which is stable.   Orders placed this encounter:  No orders of the defined types were placed in this encounter.    Derek Jack,  MD [place] [phone]

## 2022-06-20 NOTE — Patient Instructions (Signed)
[facility] at [facility] Discharge Instructions   You were seen and examined today by Dr. Delton Coombes.  He reviewed the results of your lab work which are normal/stable.   He reviewed the results of your CT scan which is normal/stable.   Follow up with Dr. Nevada Crane to have your platelets rechecked. They were low at 120 but nothing to be concerned about.   We will see you back in 6 months. We will repeat a CT scan and lab work prior to your next office visit.    Thank you for choosing [facility] at [facility] to provide your oncology and hematology care.  To afford each patient quality time with our provider, please arrive at least 15 minutes before your scheduled appointment time.   If you have a lab appointment with the [facility] please come in thru the Main Entrance and check in at the main information desk.  You need to re-schedule your appointment should you arrive 10 or more minutes late.  We strive to give you quality time with our providers, and arriving late affects you and other patients whose appointments are after yours.  Also, if you no show three or more times for appointments you may be dismissed from the clinic at the providers discretion.     Again, thank you for choosing [facility].  Our hope is that these requests will decrease the amount of time that you wait before being seen by our physicians.       _____________________________________________________________  Should you have questions after your visit to [facility], please contact our office at [phone] and follow the prompts.  Our office hours are 8:00 a.m. and 4:30 p.m. Monday - Friday.  Please note that voicemails left after 4:00 p.m. may not be returned until the following business day.  We are closed weekends and major holidays.  You do have access to a nurse 24-7, just call the main number to the clinic [phone] and do not press  any options, hold on the line and a nurse will answer the phone.    For prescription refill requests, have your pharmacy contact our office and allow 72 hours.    Due to Covid, you will need to wear a mask upon entering the hospital. If you do not have a mask, a mask will be given to you at the Main Entrance upon arrival. For doctor visits, patients may have 1 support person age 67 or older with them. For treatment visits, patients can not have anyone with them due to social distancing guidelines and our immunocompromised population.

## 2022-07-26 ENCOUNTER — Encounter: Payer: Self-pay | Admitting: *Deleted

## 2022-10-09 ENCOUNTER — Encounter: Payer: Self-pay | Admitting: *Deleted

## 2022-10-11 NOTE — Progress Notes (Unsigned)
[facility] [address] [name]. [place] [address] [phone]       OFFICE FOLLOW UP NOTE  Mark Foster Date of Birth:  12-Aug-1956 Medical Record Number:  [id number]   Referring MD: Minus Breeding Reason for Referral: Stroke   Chief complaint: No chief complaint on file.      HPI:   Update 10/12/2022 JM: Patient returns for 1 year follow-up.  Stable from stroke standpoint without new stroke/TIA symptoms. Compliant on aspirin and atorvastatin Blood pressure well controlled   Remains off ASM, no witnessed seizure activity      History provided for reference purposes only Update 10/06/2021 JM: Returns for 9-monthfollow-up accompanied by his accompanied by his wife. Main complaint today is in regards to worsening gait and balance over the past 2 months. Balance can worsen if standing too long. He has also been experiencing increased neck pain with limited ROM.  Followed by Dr. YLorin Mercywith prior visit in 04/2021 who mentioned changes in gait at that time. Surgery on hold due to elevated A1c with recent A1c 9.1 (per pt, unable to view via epic).  Recently received referral from PCP for second opinion through [facility]- awaiting to schedule consult. Denies any new weakness, numbness/tingling, vision changes or any other stroke/TIA symptoms. Neuropathy has been stable without worsening. Continues on gabapentin, denies side effects.  Previously on Keppra for possible seizures with presyncopal episodes but at some point has discontinued, unsure when or reason. No recent events.  Compliant on aspirin and atorvastatin without side effects.  Blood pressure today 120/69.  No further concerns at this time.  Update 03/30/2021 JM: Mr. MJupinreturns for 68-monthtroke follow-up accompanied by his wife.  Stable from stroke standpoint without new stroke/TIA symptoms.  Compliant on Plavix and atorvastatin for secondary stroke prevention as well as aspirin s/p CABG and Eliquis for LUE  DVT without associated side effects.  Blood pressure today 93/56.  Glucose levels stable.  Remains on Keppra XR 750 mg nightly tolerating without side effects and no additional seizure activity or syncopal events.  He did undergo EMG/NCV in 11/2020 which showed bilateral carpal tunnel syndrome as well as chronic C7 radiculopathy and referred to orthopedics.  He does report continued hand pain with intermittent pain -on gabapentin 300 mg 3 times daily tolerating without side effects.  He has not yet had follow-up with orthopedics sent cervical imaging completed.  No further concerns at this time.  Update 09/29/2020 JM: Mr. MaManloveeturns for 68-monthroke follow-up. He reports he has done well from a stroke standpoint without new stroke/TIA symptoms.  He does report over the past 3 months progressive L>R hand numbness and left forearm, wrist and hand cramping especially when attempting to play his guitar.  He does have known neuropathy previously BLE distally on gabapentin as well as prior mention of BUE numbness/tingling but reports progressive worsening over the past 3 months interfering with activity.  He has remained on Keppra XR 500 mg daily tolerating without side effects but wife does report presyncopal type event 1 to 2 weeks ago and yesterday felt generalized weakness, fatigue and "seemed out of it".  This did not progress to presyncopal or syncopal event or loss of consciousness.  Similar events to prior episodes for which he was started on Keppra.  He denies missing any Keppra dosages or recent medication changes.  He has remained on Eliquis for LUE DVT currently managed by oncology with plans on continuing Eliquis until port removal.  Remains on aspirin, atorvastatin and Zetia for secondary stroke prevention without side effects. Prior lipid panel (05/2020) showed LDL 44. Blood pressure today 96/60. Glucose levels improving per report. Recent A1c 8.9 down from 11.3. Routinely follows with endocrinology.   No further concerns at this time.  Update 03/29/2020 JM: Mark Foster returns for follow-up with prior history of stroke and transient LOC episodes possibly syncope vs seizure.  Stable from a neurological standpoint since prior visit without recurrent episodes and remains on Keppra XR 500 mg daily tolerating well without side effects.  EEG completed which was normal.  Since prior visit, underwent CABGx4 and 01/2020 with a left subclavian DVT shortly after discharge and initiation of Eliquis currently managed by cardiology.  Previously on Plavix for stroke prevention and initiation of aspirin by cardiology post CABG procedure.  Remains on Eliquis, Plavix and aspirin without bleeding or bruising.  Continues on atorvastatin 80 mg daily and Zetia 10 mg daily without myalgias.  Blood pressure today 140/66.  Reports glucose levels stable.  DM managed by endocrinology.  Follows closely with cardiology, cardiothoracic and PCP.  No concerns at this time.  Update 12/11/2019 Dr. Leonie Man: Mark Foster is a 67 year old Caucasian male with past medical history of diabetes, vitamin D deficiency, pancreatic cancer s/p chemotherapy, hypothyroidism, diabetic neuropathy stroke in 2011 and hypertension who presented to [facility] on 10/09/2019 with episode of brief unresponsiveness.  Patient states that he had gone to a musical open mic outdoor event with a friend.  He remembers keeping his guitar down and going to the bar and getting a beer for himself and his friend.  He lost consciousness and the next thing he remembers was waking up in the ambulance.  Apparently there was no witnessed tonic-clonic activity tongue bite.  The patient was little confused and had some slurred speech when he woke up.  The symptoms resolved by the time he reached the hospital.  He had an MRI scan of the brain done on 10/10/2019 which I personally reviewed shows tiny enhancing areas of enhancement in the right temporal lobe likely subacute  infarcts.  Metastasis would be less likely.  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  He was in fact followed in office and had a strokelike episode on 01/19/2018 which was treated with IV TPA and showed clinical improvement at that time the MRI had shown remote age left right MCA infarct which patient was not aware of.  He had stent assisted angioplasty of left vertebral artery stenosis on 03/21/2018 by Dr. Estanislado Pandy.  He has no prior history so history of seizures in the form of loss of consciousness, tongue biting or injury.  He denies any headache, slurred speech, gait or balance problems.  ROS:   14 system review of systems is positive for see HPI and all other systems negative   PMH:  Past Medical History:  Diagnosis Date   Bilateral carpal tunnel syndrome 10/19/2020   CAD (coronary artery disease)    a. 01/2020: CABG x4 with LIMA-LAD, SVG-OM, SVG-PDA and SVG-D1   Carotid artery obstruction, left    Left ICA occlusion   Cervical compression fracture (HCC)    Cervical spinal stenosis    Diabetic neuropathy (HCC)    Bilateral legs   Diverticulitis    Family history of pancreatic cancer    History of kidney stones    Hypothyroidism    Pancreatic cancer ([place])    Stenosis  of left vertebral artery    Stroke (cerebrum) ([place]) 10/09/2019   Type 2 diabetes mellitus (HCC)     Social History:  Social History   Socioeconomic History   Marital status: Married    Spouse name: Not on file   Number of children: 2   Years of education: Not on file   Highest education level: Not on file  Occupational History   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  Tobacco Use   Smoking status: Former    Types: Cigarettes    Start date: 10/06/2019   Smokeless tobacco: Never  Vaping Use   Vaping Use: Never used  Substance and Sexual Activity   Alcohol use: Yes    Alcohol/week: 1.0 standard drink of alcohol    Types: 1 Cans of beer per week    Comment: rare     Drug use: Never   Sexual activity: Not on file  Other Topics Concern   Not on file  Social History Narrative   Not on file   Social Determinants of Health   Financial Resource Strain: Medium Risk (12/18/2018)   Overall Financial Resource Strain (CARDIA)    Difficulty of Paying Living Expenses: Somewhat hard  Food Insecurity: No Food Insecurity (12/18/2018)   Hunger Vital Sign    Worried About Running Out of Food in the Last Year: Never true    Ran Out of Food in the Last Year: Never true  Transportation Needs: No Transportation Needs (12/18/2018)   PRAPARE - Hydrologist (Medical): No    Lack of Transportation (Non-Medical): No  Physical Activity: Inactive (12/18/2018)   Exercise Vital Sign    Days of Exercise per Week: 0 days    Minutes of Exercise per Session: 0 min  Stress: Stress Concern Present (12/18/2018)   [place]    Feeling of Stress : To some extent  Social Connections: Somewhat Isolated (12/18/2018)   Social Connection and Isolation Panel [NHANES]    Frequency of Communication with Friends and Family: More than three times a week    Frequency of Social Gatherings with Friends and Family: Once a week    Attends Religious Services: More than 4 times per year    Active Member of [employer] or Organizations: No    Attends Archivist Meetings: Never    Marital Status: Divorced  Human resources officer Violence: Not At Risk (08/16/2020)   Humiliation, Afraid, Rape, and Kick questionnaire    Fear of Current or Ex-Partner: No    Emotionally Abused: No    Physically Abused: No    Sexually Abused: No    Medications:   Current Outpatient Medications on File Prior to Visit  Medication Sig Dispense Refill   aspirin EC 81 MG EC tablet Take 1 tablet (81 mg total) by mouth daily.     atorvastatin (LIPITOR) 80 MG tablet Take 1 tablet (80 mg total) by mouth daily at 6 PM. 90 tablet 1    Continuous Blood Gluc Receiver (FREESTYLE LIBRE 2 READER) DEVI As directed 1 each 0   Continuous Blood Gluc Sensor (FREESTYLE LIBRE 2 SENSOR) MISC USE AS DIRECTED TO CHECK BLOOD SUGAR, REPLACE EVERY 14 DAYS 2 each 1   ECHINACEA COMPLEX PO Take 1,000 mg by mouth daily.     ezetimibe (ZETIA) 10 MG tablet Take by mouth.     FREESTYLE PRECISION NEO TEST test strip USE TO TEST BLOOD SUGAR 4 TIMES DAILY. 100 strip 2  gabapentin (NEURONTIN) 300 MG capsule Take 1 capsule (300 mg total) by mouth 3 (three) times daily. 270 capsule 3   insulin aspart (NOVOLOG FLEXPEN) 100 UNIT/ML FlexPen Inject 14-20 Units into the skin 3 (three) times daily before meals. (Patient taking differently: Inject 18-24 Units into the skin 3 (three) times daily before meals.) 30 mL 1   Insulin Pen Needle 32G X 4 MM MISC 1 Device by Does not apply route daily. Use as directed to inject insulin four times daily 200 each 2   levothyroxine (SYNTHROID) 50 MCG tablet TAKE 1 TABLET EVERY DAY 90 tablet 1   metoprolol tartrate (LOPRESSOR) 25 MG tablet Take 25 mg by mouth 2 (two) times daily.     nitroGLYCERIN (NITROSTAT) 0.4 MG SL tablet Place 1 tablet (0.4 mg total) under the tongue every 5 (five) minutes as needed for chest pain. 25 tablet 3   Omega-3 1000 MG CAPS Take 1,000 mg by mouth daily.     sertraline (ZOLOFT) 50 MG tablet TAKE 1 TABLET BY MOUTH DAILY 90 tablet 1   tamsulosin (FLOMAX) 0.4 MG CAPS capsule Take 1 capsule (0.4 mg total) by mouth daily. (Patient taking differently: Take 0.4 mg by mouth at bedtime.) 90 capsule 3   TOUJEO MAX SOLOSTAR 300 UNIT/ML Solostar Pen INJECT 60 UNITS INTO THE SKIN AT BEDTIME. 6 mL 0   traMADol (ULTRAM) 50 MG tablet Take 50 mg by mouth 4 (four) times daily as needed for severe pain.     vitamin B-12 (CYANOCOBALAMIN) 1000 MCG tablet Take 1,000 mcg by mouth daily.     Current Facility-Administered Medications on File Prior to Visit  Medication Dose Route Frequency Provider Last Rate Last Admin    0.9 %  sodium chloride infusion   Intravenous Continuous Derek Jack, MD 20 mL/hr at 12/30/18 1351 New Bag at 12/30/18 1351   0.9 %  sodium chloride infusion   Intravenous Continuous Derek Jack, MD 20 mL/hr at 12/30/18 1401 New Bag at 12/30/18 1401    Allergies:   Allergies  Allergen Reactions   Demerol [Meperidine Hcl] Other (See Comments)    convulsions    There were no vitals filed for this visit.   There is no height or weight on file to calculate BMI.  Physical Exam General: Pleasant middle-age Caucasian male seated, in no evident distress Head: head normocephalic and atraumatic.   Neck: supple with no carotid or supraclavicular bruits Cardiovascular: regular rate and rhythm, no murmurs Musculoskeletal: no deformity Skin:  no rash/petichiae Vascular:  Normal pulses all extremities  Neurologic Exam Mental Status: Awake and fully alert.  Fluent speech and language although occasional hesitation.  Oriented to place and time. Recent and remote memory intact. Attention span, concentration and fund of knowledge appropriate. Mood and affect appropriate.  Cranial Nerves: Pupils equal, briskly reactive to light. Extraocular movements full without nystagmus. Visual fields full to confrontation. Hearing intact. Facial sensation intact. Face, tongue, palate moves normally and symmetrically.  Motor: Normal bulk and tone. Normal strength in all tested extremity muscles. Sensory.: intact to touch but diminished position, pinprick and vibratory sensation in sock distribution bilaterally Coordination: Rapid alternating movements normal in all extremities. Finger-to-nose and heel-to-shin performed accurately bilaterally. Gait and Station: Arises from chair without difficulty. Stance is broad-based gait demonstrates mild ataxia and broad-based gait with mild imbalance.  Difficulty performing tandem walk.   Reflexes: 1+ and symmetric in upper extremities and depressed in lower  extremity.. Toes downgoing.      ASSESSMENT: 67 year old Caucasian  male with history significant for episode of brief loss of consciousness in 12/2019 possible prolonged syncope versus unwitnessed seizure with initiation of Keppra and reoccurrence 09/2020 with 2 additional presyncopal type events therefore keppra dosage increased without any additional events, and subacute embolic right temporal infarcts of cryptogenic etiology potentially related to hypercoagulability from pancreatic cancer.  Vascular risk factors of strokelike episode s/p TPA 01/2018, chronic left MCA stroke, hypertension, diabetes, hyperlipidemia,  pancreatic cancer s/p chemo (completed 09/2019), BLE neuropathy, vertebral artery stenosis s/p angioplasty stenting 03/2018 and left ICA occlusion.  S/p CABGx4 on 01/26/2020 with DVT left subclavian treated with Eliqius.    PLAN:  Worsening gait/balance High suspicion in setting of progressive cervical stenosis as well as prior stroke and neuropathy Plans on second opinion through [facility] for surgical options Followed by Dr. Lorin Mercy orthopedics who recommended holding on surgery due to elevated A1c No indication for imaging or further work up from stroke standpoint - no other stroke symptoms and exam unremarkable. Will proceed with brain imaging if symptoms worsen  History of multiple strokes:  Continue aspirin '81mg'$  daily and atorvastatin 80 mg daily\ for secondary stroke prevention 30-day cardiac event monitor negative for atrial fibrillation Close PCP, cardiology and endocrinology f/u for aggressive stroke risk factor management including HTN, HLD and DM  Syncope vs seizure:  No additional events since prior visit  At some point, keppra stopped for unknown reason No additional events over the past year - can remain of keppra at this time but low threshold for restarting  Neuropathy history of uncontrolled DM, s/p chemo and radiation, B12 deficiency on supplementation, and chronic  cervical stenosis EMG/NCV 10/19/2020 bilateral CTS of moderate severity, chronic stable denervation of left tricep muscle potentially associate with chronic C7 radiculopathy Continue gabapentin 300 mg 3 times daily - refill provided   L VA stenosis s/p stenting and left ICA occlusion:  Routinely followed by Dr. Estanislado Pandy - repeat imaging 07/2021 stable. Plans on 6 mo f/u imaging      Follow-up in 1 year or call earlier if needed    CC:  Celene Squibb, MD    I spent 38 minutes of face-to-face and non-face-to-face time with patient and wife.  This included previsit chart review, lab review, study review, order entry, electronic health record documentation, patient education and discussion regarding prior strokes, worsening balance/gait and possible etiologies, prior syncopal versus seizure type event, continued neuropathy and medication usage, importance of managing stroke risk factors and answering all other questions to patient and wife's satisfaction   Frann Rider, AGNP-BC  [facility] [address] [place], [place] [address]  Phone [phone] Fax [phone] Note: This document was prepared with digital dictation and possible smart phrase technology. Any transcriptional errors that result from this process are unintentional.

## 2022-10-12 ENCOUNTER — Ambulatory Visit: Payer: PPO | Admitting: Adult Health

## 2022-10-12 ENCOUNTER — Encounter: Payer: Self-pay | Admitting: Adult Health

## 2022-10-12 ENCOUNTER — Telehealth: Payer: Self-pay | Admitting: Adult Health

## 2022-10-12 VITALS — BP 105/64 | HR 77 | Ht 70.0 in | Wt 223.2 lb

## 2022-10-12 DIAGNOSIS — R4789 Other speech disturbances: Secondary | ICD-10-CM | POA: Diagnosis not present

## 2022-10-12 DIAGNOSIS — R413 Other amnesia: Secondary | ICD-10-CM | POA: Diagnosis not present

## 2022-10-12 DIAGNOSIS — Z9189 Other specified personal risk factors, not elsewhere classified: Secondary | ICD-10-CM | POA: Diagnosis not present

## 2022-10-12 DIAGNOSIS — G629 Polyneuropathy, unspecified: Secondary | ICD-10-CM

## 2022-10-12 DIAGNOSIS — Z8673 Personal history of transient ischemic attack (TIA), and cerebral infarction without residual deficits: Secondary | ICD-10-CM

## 2022-10-12 MED ORDER — GABAPENTIN 300 MG PO CAPS: 300.0000 mg | ORAL_CAPSULE | Freq: Three times a day (TID) | ORAL | 3 refills | Status: DC

## 2022-10-12 MED ORDER — GABAPENTIN 300 MG PO CAPS: 300.0000 mg | ORAL_CAPSULE | Freq: Every evening | ORAL | 5 refills | Status: DC | PRN

## 2022-10-12 NOTE — Patient Instructions (Addendum)
You will be called to schedule an MRI brain to ensure no new stroke  You will be called to scheduled sleep evaluation to look for possible sleep apnea  Continue gabapentin '300mg'$  three times daily, can take extra capsule as needed (new order placed for this)   Please call with any additional events like you experienced a couple months ago  Continue aspirin 81 mg daily  and atorvastatin  for secondary stroke prevention  Continue to follow up with PCP regarding cholesterol, diabetes and blood pressure management  Maintain strict control of hypertension with blood pressure goal below 130/90, diabetes with hemoglobin A1c goal below 7.0 % and cholesterol with LDL cholesterol (bad cholesterol) goal below 70 mg/dL.   Signs of a Stroke? Follow the BEFAST method:  Balance Watch for a sudden loss of balance, trouble with coordination or vertigo Eyes Is there a sudden loss of vision in one or both eyes? Or double vision?  Face: Ask the person to smile. Does one side of the face droop or is it numb?  Arms: Ask the person to raise both arms. Does one arm drift downward? Is there weakness or numbness of a leg? Speech: Ask the person to repeat a simple phrase. Does the speech sound slurred/strange? Is the person confused ? Time: If you observe any of these signs, call 911.     Followup in the future with me in 6 months or call earlier if needed       Thank you for coming to see [place] at [facility]. I hope we have been able to provide you high quality care today.  You may receive a patient satisfaction survey over the next few weeks. We would appreciate your feedback and comments so that we may continue to improve ourselves and the health of our patients.

## 2022-10-12 NOTE — Telephone Encounter (Signed)
[facility] adv NPR sent to [facility] [phone]

## 2022-10-29 ENCOUNTER — Inpatient Hospital Stay (HOSPITAL_COMMUNITY)
Admission: EM | Admit: 2022-10-29 | Discharge: 2022-10-31 | DRG: 871 | Disposition: A | Discharge status: Home / Self Care | Payer: PPO | Attending: Family Medicine | Admitting: Family Medicine

## 2022-10-29 ENCOUNTER — Emergency Department (HOSPITAL_COMMUNITY): Payer: PPO

## 2022-10-29 ENCOUNTER — Other Ambulatory Visit: Payer: Self-pay

## 2022-10-29 ENCOUNTER — Encounter (HOSPITAL_COMMUNITY): Payer: Self-pay | Admitting: *Deleted

## 2022-10-29 DIAGNOSIS — Z951 Presence of aortocoronary bypass graft: Secondary | ICD-10-CM

## 2022-10-29 DIAGNOSIS — E871 Hypo-osmolality and hyponatremia: Secondary | ICD-10-CM | POA: Diagnosis not present

## 2022-10-29 DIAGNOSIS — E1165 Type 2 diabetes mellitus with hyperglycemia: Secondary | ICD-10-CM | POA: Diagnosis present

## 2022-10-29 DIAGNOSIS — Z79890 Hormone replacement therapy (postmenopausal): Secondary | ICD-10-CM

## 2022-10-29 DIAGNOSIS — F1011 Alcohol abuse, in remission: Secondary | ICD-10-CM | POA: Diagnosis present

## 2022-10-29 DIAGNOSIS — J40 Bronchitis, not specified as acute or chronic: Secondary | ICD-10-CM | POA: Diagnosis present

## 2022-10-29 DIAGNOSIS — E1140 Type 2 diabetes mellitus with diabetic neuropathy, unspecified: Secondary | ICD-10-CM | POA: Diagnosis not present

## 2022-10-29 DIAGNOSIS — Z79899 Other long term (current) drug therapy: Secondary | ICD-10-CM | POA: Diagnosis not present

## 2022-10-29 DIAGNOSIS — Z794 Long term (current) use of insulin: Secondary | ICD-10-CM

## 2022-10-29 DIAGNOSIS — Z7982 Long term (current) use of aspirin: Secondary | ICD-10-CM | POA: Diagnosis not present

## 2022-10-29 DIAGNOSIS — U071 COVID-19: Secondary | ICD-10-CM | POA: Diagnosis not present

## 2022-10-29 DIAGNOSIS — N3021 Other chronic cystitis with hematuria: Secondary | ICD-10-CM | POA: Diagnosis not present

## 2022-10-29 DIAGNOSIS — Z8249 Family history of ischemic heart disease and other diseases of the circulatory system: Secondary | ICD-10-CM

## 2022-10-29 DIAGNOSIS — Z800 Family history of malignant neoplasm of digestive organs: Secondary | ICD-10-CM

## 2022-10-29 DIAGNOSIS — Z8507 Personal history of malignant neoplasm of pancreas: Secondary | ICD-10-CM

## 2022-10-29 DIAGNOSIS — Z86718 Personal history of other venous thrombosis and embolism: Secondary | ICD-10-CM

## 2022-10-29 DIAGNOSIS — K5730 Diverticulosis of large intestine without perforation or abscess without bleeding: Secondary | ICD-10-CM | POA: Diagnosis not present

## 2022-10-29 DIAGNOSIS — E039 Hypothyroidism, unspecified: Secondary | ICD-10-CM | POA: Diagnosis not present

## 2022-10-29 DIAGNOSIS — E1151 Type 2 diabetes mellitus with diabetic peripheral angiopathy without gangrene: Secondary | ICD-10-CM | POA: Diagnosis present

## 2022-10-29 DIAGNOSIS — N3000 Acute cystitis without hematuria: Secondary | ICD-10-CM

## 2022-10-29 DIAGNOSIS — Z28310 Unvaccinated for covid-19: Secondary | ICD-10-CM

## 2022-10-29 DIAGNOSIS — C259 Malignant neoplasm of pancreas, unspecified: Secondary | ICD-10-CM | POA: Diagnosis not present

## 2022-10-29 DIAGNOSIS — D61818 Other pancytopenia: Secondary | ICD-10-CM | POA: Diagnosis not present

## 2022-10-29 DIAGNOSIS — N3001 Acute cystitis with hematuria: Secondary | ICD-10-CM | POA: Diagnosis not present

## 2022-10-29 DIAGNOSIS — E872 Acidosis, unspecified: Secondary | ICD-10-CM | POA: Diagnosis not present

## 2022-10-29 DIAGNOSIS — Z9221 Personal history of antineoplastic chemotherapy: Secondary | ICD-10-CM

## 2022-10-29 DIAGNOSIS — J1282 Pneumonia due to coronavirus disease 2019: Secondary | ICD-10-CM | POA: Diagnosis not present

## 2022-10-29 DIAGNOSIS — Z823 Family history of stroke: Secondary | ICD-10-CM

## 2022-10-29 DIAGNOSIS — Z87442 Personal history of urinary calculi: Secondary | ICD-10-CM

## 2022-10-29 DIAGNOSIS — N3289 Other specified disorders of bladder: Secondary | ICD-10-CM | POA: Diagnosis not present

## 2022-10-29 DIAGNOSIS — Z90411 Acquired partial absence of pancreas: Secondary | ICD-10-CM

## 2022-10-29 DIAGNOSIS — Z87891 Personal history of nicotine dependence: Secondary | ICD-10-CM

## 2022-10-29 DIAGNOSIS — I2510 Atherosclerotic heart disease of native coronary artery without angina pectoris: Secondary | ICD-10-CM | POA: Diagnosis not present

## 2022-10-29 DIAGNOSIS — I10 Essential (primary) hypertension: Secondary | ICD-10-CM | POA: Diagnosis present

## 2022-10-29 DIAGNOSIS — Z885 Allergy status to narcotic agent status: Secondary | ICD-10-CM

## 2022-10-29 DIAGNOSIS — J189 Pneumonia, unspecified organism: Secondary | ICD-10-CM | POA: Diagnosis not present

## 2022-10-29 DIAGNOSIS — A419 Sepsis, unspecified organism: Secondary | ICD-10-CM | POA: Diagnosis not present

## 2022-10-29 DIAGNOSIS — A4151 Sepsis due to Escherichia coli [E. coli]: Principal | ICD-10-CM | POA: Diagnosis present

## 2022-10-29 DIAGNOSIS — Z9081 Acquired absence of spleen: Secondary | ICD-10-CM

## 2022-10-29 DIAGNOSIS — Z8673 Personal history of transient ischemic attack (TIA), and cerebral infarction without residual deficits: Secondary | ICD-10-CM

## 2022-10-29 DIAGNOSIS — N390 Urinary tract infection, site not specified: Secondary | ICD-10-CM | POA: Diagnosis not present

## 2022-10-29 DIAGNOSIS — E119 Type 2 diabetes mellitus without complications: Secondary | ICD-10-CM

## 2022-10-29 DIAGNOSIS — N401 Enlarged prostate with lower urinary tract symptoms: Secondary | ICD-10-CM

## 2022-10-29 DIAGNOSIS — R0602 Shortness of breath: Secondary | ICD-10-CM | POA: Diagnosis not present

## 2022-10-29 DIAGNOSIS — K8020 Calculus of gallbladder without cholecystitis without obstruction: Secondary | ICD-10-CM | POA: Diagnosis not present

## 2022-10-29 LAB — DIFFERENTIAL
Abs Immature Granulocytes: 0.1 10*3/uL — ABNORMAL HIGH (ref 0.00–0.07)
Band Neutrophils: 24 %
Basophils Absolute: 0 10*3/uL (ref 0.0–0.1)
Basophils Relative: 0 %
Eosinophils Absolute: 0 10*3/uL (ref 0.0–0.5)
Eosinophils Relative: 0 %
Lymphocytes Relative: 48 %
Lymphs Abs: 1 10*3/uL (ref 0.7–4.0)
Metamyelocytes Relative: 6 %
Monocytes Absolute: 0 10*3/uL — ABNORMAL LOW (ref 0.1–1.0)
Monocytes Relative: 0 %
Neutro Abs: 1 10*3/uL — ABNORMAL LOW (ref 1.7–7.7)
Neutrophils Relative %: 22 %

## 2022-10-29 LAB — URINALYSIS, ROUTINE W REFLEX MICROSCOPIC
Bilirubin Urine: NEGATIVE
Glucose, UA: >=500 mg/dL — AB
Ketones, ur: 5 mg/dL — AB
Nitrite: NEGATIVE
Protein, ur: 100 mg/dL — AB
Specific Gravity, Urine: 1.021 (ref 1.005–1.030)
WBC, UA: >50 WBC/hpf (ref 0–5)
pH: 5 (ref 5.0–8.0)

## 2022-10-29 LAB — HEPATIC FUNCTION PANEL
ALT: 19 U/L (ref 0–44)
AST: 18 U/L (ref 15–41)
Albumin: 3.4 g/dL — ABNORMAL LOW (ref 3.5–5.0)
Alkaline Phosphatase: 57 U/L (ref 38–126)
Bilirubin, Direct: 0.3 mg/dL — ABNORMAL HIGH (ref 0.0–0.2)
Indirect Bilirubin: 1.6 mg/dL — ABNORMAL HIGH (ref 0.3–0.9)
Total Bilirubin: 1.9 mg/dL — ABNORMAL HIGH (ref 0.3–1.2)
Total Protein: 6.5 g/dL (ref 6.5–8.1)

## 2022-10-29 LAB — GLUCOSE, CAPILLARY
Glucose-Capillary: 242 mg/dL — ABNORMAL HIGH (ref 70–99)
Glucose-Capillary: 315 mg/dL — ABNORMAL HIGH (ref 70–99)
Glucose-Capillary: 283 mg/dL — ABNORMAL HIGH (ref 70–99)

## 2022-10-29 LAB — CBC
HCT: 35.3 % — ABNORMAL LOW (ref 39.0–52.0)
Hemoglobin: 12.5 g/dL — ABNORMAL LOW (ref 13.0–17.0)
MCH: 36.1 pg — ABNORMAL HIGH (ref 26.0–34.0)
MCHC: 35.4 g/dL (ref 30.0–36.0)
MCV: 102 fL — ABNORMAL HIGH (ref 80.0–100.0)
Platelets: 81 10*3/uL — ABNORMAL LOW (ref 150–400)
RBC: 3.46 MIL/uL — ABNORMAL LOW (ref 4.22–5.81)
RDW: 16.9 % — ABNORMAL HIGH (ref 11.5–15.5)
WBC: 2.1 10*3/uL — ABNORMAL LOW (ref 4.0–10.5)
nRBC: 0 % (ref 0.0–0.2)

## 2022-10-29 LAB — RESP PANEL BY RT-PCR (RSV, FLU A&B, COVID)  RVPGX2
Influenza A by PCR: NEGATIVE
Influenza B by PCR: NEGATIVE
Resp Syncytial Virus by PCR: NEGATIVE
SARS Coronavirus 2 by RT PCR: POSITIVE — AB

## 2022-10-29 LAB — BASIC METABOLIC PANEL
Anion gap: 8 (ref 5–15)
BUN: 18 mg/dL (ref 8–23)
CO2: 20 mmol/L — ABNORMAL LOW (ref 22–32)
Calcium: 8.3 mg/dL — ABNORMAL LOW (ref 8.9–10.3)
Chloride: 103 mmol/L (ref 98–111)
Creatinine, Ser: 0.92 mg/dL (ref 0.61–1.24)
GFR, Estimated: >60 mL/min (ref >60)
Glucose, Bld: 245 mg/dL — ABNORMAL HIGH (ref 70–99)
Potassium: 3.9 mmol/L (ref 3.5–5.1)
Sodium: 131 mmol/L — ABNORMAL LOW (ref 135–145)

## 2022-10-29 LAB — LACTIC ACID, PLASMA
Lactic Acid, Venous: 1.3 mmol/L (ref 0.5–1.9)
Lactic Acid, Venous: 1.2 mmol/L (ref 0.5–1.9)

## 2022-10-29 LAB — CBG MONITORING, ED: Glucose-Capillary: 230 mg/dL — ABNORMAL HIGH (ref 70–99)

## 2022-10-29 MED ORDER — FOLIC ACID 1 MG PO TABS
1.0000 mg | ORAL_TABLET | Freq: Every day | ORAL | Status: DC
  Administered 2022-10-29 – 2022-10-31 (×3): 1 mg via ORAL
  Filled 2022-10-29 (×3): qty 1

## 2022-10-29 MED ORDER — METHYLPREDNISOLONE SODIUM SUCC 40 MG IJ SOLR
40.0000 mg | Freq: Two times a day (BID) | INTRAMUSCULAR | Status: DC
  Administered 2022-10-29 – 2022-10-31 (×5): 40 mg via INTRAVENOUS
  Filled 2022-10-29 (×5): qty 1

## 2022-10-29 MED ORDER — METOPROLOL SUCCINATE ER 25 MG PO TB24
25.0000 mg | ORAL_TABLET | Freq: Every day | ORAL | Status: DC
  Administered 2022-10-30 – 2022-10-31 (×2): 25 mg via ORAL
  Filled 2022-10-29 (×2): qty 1

## 2022-10-29 MED ORDER — ACETAMINOPHEN 650 MG RE SUPP: 650.0000 mg | Freq: Four times a day (QID) | RECTAL | Status: DC | PRN

## 2022-10-29 MED ORDER — BISACODYL 10 MG RE SUPP: 10.0000 mg | Freq: Every day | RECTAL | Status: DC | PRN

## 2022-10-29 MED ORDER — ASPIRIN 81 MG PO TBEC
81.0000 mg | DELAYED_RELEASE_TABLET | Freq: Every day | ORAL | Status: DC
  Administered 2022-10-30 – 2022-10-31 (×2): 81 mg via ORAL
  Filled 2022-10-29 (×2): qty 1

## 2022-10-29 MED ORDER — HEPARIN SODIUM (PORCINE) 5000 UNIT/ML IJ SOLN
5000.0000 [IU] | Freq: Three times a day (TID) | INTRAMUSCULAR | Status: DC
  Administered 2022-10-29 – 2022-10-31 (×6): 5000 [IU] via SUBCUTANEOUS
  Filled 2022-10-29 (×7): qty 1

## 2022-10-29 MED ORDER — SODIUM CHLORIDE 0.9 % IV SOLN: INTRAVENOUS | Status: DC | PRN

## 2022-10-29 MED ORDER — TAMSULOSIN HCL 0.4 MG PO CAPS
0.4000 mg | ORAL_CAPSULE | Freq: Every day | ORAL | Status: DC
  Administered 2022-10-29 – 2022-10-30 (×2): 0.4 mg via ORAL
  Filled 2022-10-29 (×2): qty 1

## 2022-10-29 MED ORDER — ACETAMINOPHEN 500 MG PO TABS
1000.0000 mg | ORAL_TABLET | Freq: Once | ORAL | Status: AC
  Administered 2022-10-29: 1000 mg via ORAL
  Filled 2022-10-29: qty 2

## 2022-10-29 MED ORDER — ACETAMINOPHEN 325 MG PO TABS
650.0000 mg | ORAL_TABLET | Freq: Four times a day (QID) | ORAL | Status: DC | PRN
  Administered 2022-10-29: 650 mg via ORAL
  Filled 2022-10-29: qty 2

## 2022-10-29 MED ORDER — SODIUM CHLORIDE 0.9 % IV SOLN: INTRAVENOUS | Status: DC

## 2022-10-29 MED ORDER — IPRATROPIUM-ALBUTEROL 20-100 MCG/ACT IN AERS
1.0000 | INHALATION_SPRAY | Freq: Four times a day (QID) | RESPIRATORY_TRACT | Status: DC
  Administered 2022-10-29: 1 via RESPIRATORY_TRACT
  Filled 2022-10-29: qty 4

## 2022-10-29 MED ORDER — DM-GUAIFENESIN ER 30-600 MG PO TB12
1.0000 | ORAL_TABLET | Freq: Two times a day (BID) | ORAL | Status: DC
  Administered 2022-10-29 – 2022-10-31 (×4): 1 via ORAL
  Filled 2022-10-29 (×4): qty 1

## 2022-10-29 MED ORDER — INSULIN ASPART 100 UNIT/ML IJ SOLN
0.0000 [IU] | Freq: Every day | INTRAMUSCULAR | Status: DC
  Administered 2022-10-29: 3 [IU] via SUBCUTANEOUS
  Administered 2022-10-30: 4 [IU] via SUBCUTANEOUS

## 2022-10-29 MED ORDER — ONDANSETRON HCL 4 MG/2ML IJ SOLN: 4.0000 mg | Freq: Four times a day (QID) | INTRAMUSCULAR | Status: DC | PRN

## 2022-10-29 MED ORDER — SODIUM CHLORIDE 0.9 % IV SOLN
2.0000 g | INTRAVENOUS | Status: DC
  Administered 2022-10-30 – 2022-10-31 (×2): 2 g via INTRAVENOUS
  Filled 2022-10-29 (×2): qty 20

## 2022-10-29 MED ORDER — INSULIN ASPART 100 UNIT/ML IJ SOLN: 0.0000 [IU] | Freq: Every day | INTRAMUSCULAR | Status: DC

## 2022-10-29 MED ORDER — LEVOTHYROXINE SODIUM 50 MCG PO TABS
50.0000 ug | ORAL_TABLET | Freq: Every day | ORAL | Status: DC
  Administered 2022-10-30 – 2022-10-31 (×2): 50 ug via ORAL
  Filled 2022-10-29 (×2): qty 1

## 2022-10-29 MED ORDER — INSULIN ASPART 100 UNIT/ML IJ SOLN
0.0000 [IU] | Freq: Three times a day (TID) | INTRAMUSCULAR | Status: DC
  Administered 2022-10-29: 3 [IU] via SUBCUTANEOUS

## 2022-10-29 MED ORDER — INSULIN GLARGINE-YFGN 100 UNIT/ML ~~LOC~~ SOLN
25.0000 [IU] | Freq: Every day | SUBCUTANEOUS | Status: DC
  Filled 2022-10-29: qty 0.25

## 2022-10-29 MED ORDER — SODIUM CHLORIDE 0.9 % IV SOLN
1.0000 g | Freq: Once | INTRAVENOUS | Status: AC
  Administered 2022-10-29: 1 g via INTRAVENOUS
  Filled 2022-10-29: qty 10

## 2022-10-29 MED ORDER — IPRATROPIUM-ALBUTEROL 0.5-2.5 (3) MG/3ML IN SOLN: 3.0000 mL | Freq: Four times a day (QID) | RESPIRATORY_TRACT | Status: DC | PRN

## 2022-10-29 MED ORDER — SODIUM CHLORIDE 0.9% FLUSH
3.0000 mL | Freq: Two times a day (BID) | INTRAVENOUS | Status: DC
  Administered 2022-10-29 – 2022-10-30 (×2): 3 mL via INTRAVENOUS

## 2022-10-29 MED ORDER — POLYETHYLENE GLYCOL 3350 17 G PO PACK: 17.0000 g | PACK | Freq: Every day | ORAL | Status: DC | PRN

## 2022-10-29 MED ORDER — SODIUM CHLORIDE 0.9 % IV BOLUS: 500.0000 mL | Freq: Once | INTRAVENOUS | Status: DC

## 2022-10-29 MED ORDER — THIAMINE MONONITRATE 100 MG PO TABS
100.0000 mg | ORAL_TABLET | Freq: Every day | ORAL | Status: DC
  Administered 2022-10-29 – 2022-10-31 (×3): 100 mg via ORAL
  Filled 2022-10-29 (×3): qty 1

## 2022-10-29 MED ORDER — SODIUM CHLORIDE 0.9 % IV BOLUS
1000.0000 mL | Freq: Once | INTRAVENOUS | Status: AC
  Administered 2022-10-29: 1000 mL via INTRAVENOUS

## 2022-10-29 MED ORDER — IPRATROPIUM-ALBUTEROL 20-100 MCG/ACT IN AERS
1.0000 | INHALATION_SPRAY | Freq: Three times a day (TID) | RESPIRATORY_TRACT | Status: DC
  Administered 2022-10-30: 1 via RESPIRATORY_TRACT

## 2022-10-29 MED ORDER — GABAPENTIN 300 MG PO CAPS
300.0000 mg | ORAL_CAPSULE | Freq: Three times a day (TID) | ORAL | Status: DC
  Administered 2022-10-29 – 2022-10-31 (×7): 300 mg via ORAL
  Filled 2022-10-29 (×7): qty 1

## 2022-10-29 MED ORDER — ATORVASTATIN CALCIUM 40 MG PO TABS
80.0000 mg | ORAL_TABLET | Freq: Every day | ORAL | Status: DC
  Administered 2022-10-29 – 2022-10-30 (×2): 80 mg via ORAL
  Filled 2022-10-29 (×2): qty 2

## 2022-10-29 MED ORDER — ZINC SULFATE 220 (50 ZN) MG PO CAPS
220.0000 mg | ORAL_CAPSULE | Freq: Every day | ORAL | Status: DC
  Administered 2022-10-29 – 2022-10-31 (×3): 220 mg via ORAL
  Filled 2022-10-29 (×3): qty 1

## 2022-10-29 MED ORDER — ADULT MULTIVITAMIN W/MINERALS CH
1.0000 | ORAL_TABLET | Freq: Every day | ORAL | Status: DC
  Administered 2022-10-29 – 2022-10-31 (×3): 1 via ORAL
  Filled 2022-10-29 (×3): qty 1

## 2022-10-29 MED ORDER — TRAZODONE HCL 50 MG PO TABS: 50.0000 mg | ORAL_TABLET | Freq: Every evening | ORAL | Status: DC | PRN

## 2022-10-29 MED ORDER — VITAMIN B-12 1000 MCG PO TABS
1000.0000 ug | ORAL_TABLET | Freq: Every day | ORAL | Status: DC
  Administered 2022-10-30 – 2022-10-31 (×2): 1000 ug via ORAL
  Filled 2022-10-29 (×2): qty 1

## 2022-10-29 MED ORDER — SODIUM CHLORIDE 0.9% FLUSH: 3.0000 mL | INTRAVENOUS | Status: DC | PRN

## 2022-10-29 MED ORDER — SODIUM CHLORIDE 0.9 % IV SOLN
500.0000 mg | INTRAVENOUS | Status: DC
  Administered 2022-10-30 – 2022-10-31 (×2): 500 mg via INTRAVENOUS
  Filled 2022-10-29 (×2): qty 5

## 2022-10-29 MED ORDER — VITAMIN C 500 MG PO TABS
500.0000 mg | ORAL_TABLET | Freq: Every day | ORAL | Status: DC
  Administered 2022-10-29 – 2022-10-31 (×3): 500 mg via ORAL
  Filled 2022-10-29 (×3): qty 1

## 2022-10-29 MED ORDER — INSULIN ASPART 100 UNIT/ML IJ SOLN
0.0000 [IU] | Freq: Three times a day (TID) | INTRAMUSCULAR | Status: DC
  Administered 2022-10-29: 15 [IU] via SUBCUTANEOUS
  Administered 2022-10-30: 20 [IU] via SUBCUTANEOUS
  Administered 2022-10-30: 15 [IU] via SUBCUTANEOUS
  Administered 2022-10-31: 11 [IU] via SUBCUTANEOUS
  Administered 2022-10-31: 20 [IU] via SUBCUTANEOUS

## 2022-10-29 MED ORDER — INSULIN GLARGINE-YFGN 100 UNIT/ML ~~LOC~~ SOLN
25.0000 [IU] | Freq: Two times a day (BID) | SUBCUTANEOUS | Status: DC
  Administered 2022-10-29: 25 [IU] via SUBCUTANEOUS
  Filled 2022-10-29 (×4): qty 0.25

## 2022-10-29 MED ORDER — SERTRALINE HCL 50 MG PO TABS
50.0000 mg | ORAL_TABLET | Freq: Every day | ORAL | Status: DC
  Administered 2022-10-29 – 2022-10-31 (×3): 50 mg via ORAL
  Filled 2022-10-29 (×3): qty 1

## 2022-10-29 MED ORDER — EZETIMIBE 10 MG PO TABS
10.0000 mg | ORAL_TABLET | Freq: Every day | ORAL | Status: DC
  Administered 2022-10-29 – 2022-10-31 (×3): 10 mg via ORAL
  Filled 2022-10-29 (×3): qty 1

## 2022-10-29 MED ORDER — SODIUM CHLORIDE 0.9% FLUSH: 3.0000 mL | Freq: Two times a day (BID) | INTRAVENOUS | Status: DC

## 2022-10-29 MED ORDER — ONDANSETRON HCL 4 MG PO TABS: 4.0000 mg | ORAL_TABLET | Freq: Four times a day (QID) | ORAL | Status: DC | PRN

## 2022-10-29 MED ORDER — SODIUM CHLORIDE 0.9 % IV SOLN
500.0000 mg | Freq: Once | INTRAVENOUS | Status: AC
  Administered 2022-10-29: 500 mg via INTRAVENOUS
  Filled 2022-10-29: qty 5

## 2022-10-29 NOTE — ED Triage Notes (Addendum)
Pt c/o fever that started last night up to 105 with chills. This morning he reports it was 103. Pt also c/o difficulty urinating x 2 days. Pt reports he has been "dribbling". Pt also c/o lower back pain on both sides over the last 3 days, but also reports he has arthritis in his lower back. + productive cough and generalized weakness.   Temperature 98.5 in triage and pt has taken no medications this morning for fever.

## 2022-10-29 NOTE — H&P (Addendum)
Patient Demographics:    Mark Foster, is a 67 y.o. male  MRN: [id number]   DOB - [birth year]  Admit Date - 10/29/2022  Outpatient Primary MD for the patient is Celene Squibb, MD   Assessment & Plan:   Assessment and Plan: 1) sepsis due to CAP-----bacterial pneumonia versus COVID-related pneumonia --Chest x-ray with bronchitis and possible bronchial pneumonia -Patient tested positive for COVID, negative for flu and negative for RSV -Patient with cough and respiratory symptoms --He is Not vaccinated against COVID -Rocephin/azithromycin as ordered -Steroids for presumed COVID-related pneumonia -Bronchodilators and mucolytics as ordered -Patient is currently not hypoxic -Zinc and vitamin C as ordered   2) sepsis due to presumed UTI--- patient with urinary symptoms as below in HPI IV Rocephin as above pending further urine culture data -UA consistent with UTI - CT renal protocol with finding of chronic cystitis and possible bladder outlet obstruction  3)H/o CAD/PAD---CAD with prior CABG x 4 (01/2020: CABG x4 with LIMA-LAD, SVG-OM, SVG-PDA and SVG-D1), PAD with history of left ICA occlusion,  --No chest pains and palpitations no dizziness -EKG sinus rhythm without acute findings -Continue aspirin, Lipitor and metoprolol  4) pancytopenia--- in the setting of underlying malignancy and prior chemotherapy --WBC 2.1, hemoglobin 12.5, platelets 81  5) hyponatremia--- sodium is 131 suspect dehydration related hydrate gently  6)history of Stage IIb (T2N1)  pancreatic cancer (s/p  Distal pancreatectomy and splenectomy at [facility] on 11/20/2018 followed by Adjuvant FOLFIRINOX x 12 cycles from 12/30/2018 to 06/04/2019, followed by palonosetron (ALOXI))  -CT renal stone protocol today without evidence of recurrence -Follow-up  with Dr. Delton Coombes as outpatient as previously advised  7)DM2-last A1c greater than 9 previously uncontrolled with hyperglycemia -Anticipate worsening glycemic control with steroids as above #1 -Give Lantus 25 units twice daily -Use Novolog/Humalog Sliding scale insulin with Accu-Cheks/Fingersticks as ordered   8)H/o  prior stroke--stable, no new deficits, continue aspirin and Lipitor for secondary prophylaxis  Status is: Inpatient  Remains inpatient appropriate because:   Dispo: The patient is from: Home              Anticipated d/c is to: Home              Anticipated d/c date is: 3 days              Patient currently is not medically stable to d/c. Barriers: Not Clinically Stable-  With History of - Reviewed by me  Past Medical History:  Diagnosis Date   Bilateral carpal tunnel syndrome 10/19/2020   CAD (coronary artery disease)    a. 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   Type 2 diabetes mellitus ([place])       Past Surgical History:  Procedure Laterality Date   APPENDECTOMY     BUBBLE STUDY N/A 11/18/2019   Procedure: BUBBLE STUDY;  Surgeon: Herminio Commons, MD;  Location: [facility];  Service: Cardiology;  Laterality: N/A;   CARDIAC SURGERY     COLON RESECTION     For diverticulitis   COLON SURGERY     CORONARY ARTERY BYPASS GRAFT N/A 01/26/2020   Procedure: CORONARY ARTERY BYPASS GRAFTING (CABG) TIMES FOUR USING LEFT INTERNAL MAMMARY VEIN AND RIGHT GREATER SAPHENOUS VEIN;  Surgeon: Melrose Nakayama, MD;  Location: [place];  Service: Open Heart Surgery;  Laterality: N/A;    ENDOVEIN HARVEST OF GREATER SAPHENOUS VEIN Right 01/26/2020   Procedure: [place] Of Greater Saphenous Vein;  Surgeon: Melrose Nakayama, MD;  Location: [place];  Service: Open Heart Surgery;  Laterality: Right;   ESOPHAGOGASTRODUODENOSCOPY N/A 10/03/2018   Procedure: ESOPHAGOGASTRODUODENOSCOPY (EGD);  Surgeon: [name], MD;  Location: [name];  Service: Endoscopy;  Laterality: N/A;   EUS N/A 10/03/2018   Procedure: UPPER ENDOSCOPIC ULTRASOUND (EUS) RADIAL;  Surgeon: [name], MD;  Location: [facility];  Service: Endoscopy;  Laterality: N/A;   EYE SURGERY Bilateral    FINE NEEDLE ASPIRATION N/A 10/03/2018   Procedure: FINE NEEDLE ASPIRATION (FNA) LINEAR;  Surgeon: [name], MD;  Location: [facility];  Service: Endoscopy;  Laterality: N/A;   IR ANGIO INTRA EXTRACRAN SEL COM CAROTID INNOMINATE BILAT MOD SED  01/21/2018   IR ANGIO INTRA EXTRACRAN SEL COM CAROTID INNOMINATE UNI R MOD SED  03/21/2018   IR ANGIO VERTEBRAL SEL VERTEBRAL BILAT MOD SED  01/21/2018   IR TRANSCATH EXCRAN VERT OR CAR A STENT  03/21/2018   LEFT HEART CATH AND CORONARY ANGIOGRAPHY N/A 01/08/2020   Procedure: LEFT HEART CATH AND CORONARY ANGIOGRAPHY;  Surgeon: Leonie Man, MD;  Location: [place] [facility];  Service: Cardiovascular;  Laterality: N/A;   PORT-A-CATH REMOVAL N/A 06/20/2021   Procedure: MINOR REMOVAL PORT-A-CATH;  Surgeon: Virl Cagey, MD;  Location: [facility];  Service: General;  Laterality: N/A;   PORTACATH PLACEMENT Left 12/23/2018   Procedure: INSERTION PORT-A-CATH;  Surgeon: Virl Cagey, MD;  Location: [facility];  Service: General;  Laterality: Left;   RADIOLOGY WITH ANESTHESIA N/A 03/21/2018   Procedure: IR WITH ANESTHESIA WITH STENT PLACEMENT;  Surgeon: Luanne Bras, MD;  Location: [place];  Service: Radiology;  Laterality: N/A;   ROTATOR CUFF REPAIR     Left   SPLENECTOMY     TEE WITHOUT CARDIOVERSION N/A 11/18/2019   Procedure: TRANSESOPHAGEAL  ECHOCARDIOGRAM (TEE) WITH PROPOFOL;  Surgeon: Herminio Commons, MD;  Location: [facility];  Service: Cardiology;  Laterality: N/A;   TEE WITHOUT CARDIOVERSION N/A 01/26/2020   Procedure: TRANSESOPHAGEAL ECHOCARDIOGRAM (TEE);  Surgeon: Melrose Nakayama, MD;  Location: [place];  Service: Open Heart Surgery;  Laterality: N/A;   TONSILLECTOMY      Chief Complaint  Patient presents with   Fever      HPI:    Mark Foster  is a 67 y.o. male with past medical history relevant for CAD with prior CABG x 4 (01/2020: CABG x4 with LIMA-LAD, SVG-OM, SVG-PDA and SVG-D1), PAD with history of left ICA occlusion, DM2, hypothyroidism,, history of prior stroke, history of Stage IIb (  T2N1)  pancreatic cancer (s/p  Distal pancreatectomy and splenectomy at [facility] on 11/20/2018 followed by Adjuvant FOLFIRINOX x 12 cycles from 12/30/2018 to 06/04/2019, followed by palonosetron (ALOXI))  , history of prior left upper extremity DVT resolved after Port-A-Cath removal patient to the ED with fevers up to 105 associated with chills, as well as dysuria and urinary frequency and some degree of urinary incontinence/dribbling for the last couple days -Reports cough, generalized weakness, myalgias for at least a week now--patient states respiratory symptoms have persisted for over a week -No chest pains and palpitations no dizziness -He is not vaccinated against COVID -Chest x-ray with bronchitis and possible bronchial pneumonia -Patient tested positive for COVID, negative for flu and negative for RSV -UA consistent with UTI - CT renal protocol with finding of chronic cystitis and possible bladder outlet obstruction -T. bili is 1.9, otherwise LFTs are normal -WBC 2.1, hemoglobin 12.5, platelets 81 -Sodium is 131, glucose 245 bicarb 20, anion gap 8, continue 0.92 -Lactic acidosis 1.3   Review of systems:    In addition to the HPI above,   A full Review of  Systems was done, all other systems reviewed are negative except as  noted above in HPI , .    Social History:  Reviewed by me    Social History   Tobacco Use   Smoking status: Former    Types: Cigarettes    Start date: 10/06/2019   Smokeless tobacco: Never  Substance Use Topics   Alcohol use: Not Currently    Alcohol/week: 1.0 standard drink of alcohol    Types: 1 Cans of beer per week    Comment: rare        Family History :  Reviewed by me    Family History  Problem Relation Age of Onset   Stroke Mother    Pancreatic cancer Father 79       d. 62   Stroke Maternal Grandmother    Heart attack Maternal Grandfather    Heart Problems Paternal Grandfather    Scoliosis Daughter    Muscular dystrophy Grandson      Home Medications:   Prior to Admission medications   Medication Sig Start Date End Date Taking? Authorizing Provider  aspirin EC 81 MG EC tablet Take 1 tablet (81 mg total) by mouth daily. 01/31/20  Yes Conte, Tessa N, PA-C  atorvastatin (LIPITOR) 80 MG tablet Take 1 tablet (80 mg total) by mouth daily at 6 PM. Patient taking differently: Take 80 mg by mouth daily. 05/03/20  Yes Gosrani, Nimish C, MD  Cholecalciferol (VITAMIN D3) 25 MCG (1000 UT) capsule Take 1,000 Units by mouth daily.   Yes [provider]  CINNAMON PO Take by mouth.   Yes [provider]  ECHINACEA COMPLEX PO Take 1,000 mg by mouth daily.   Yes [provider]  ezetimibe (ZETIA) 10 MG tablet Take by mouth. 10/18/21  Yes [provider]  gabapentin (NEURONTIN) 300 MG capsule Take 1 capsule (300 mg total) by mouth 3 (three) times daily. 10/12/22  Yes McCue, Janett Billow, NP  insulin aspart (NOVOLOG FLEXPEN) 100 UNIT/ML FlexPen Inject 14-20 Units into the skin 3 (three) times daily before meals. Patient taking differently: Inject 18-24 Units into the skin 3 (three) times daily before meals. 05/12/21  Yes Cassandria Anger, MD  levothyroxine (SYNTHROID) 50 MCG tablet TAKE 1 TABLET EVERY DAY 08/14/20  Yes Gosrani, Nimish C, MD   metoprolol succinate (TOPROL-XL) 25 MG 24 hr tablet Take 25 mg  by mouth daily. 08/11/22  Yes [provider]  nitroGLYCERIN (NITROSTAT) 0.4 MG SL tablet Place 1 tablet (0.4 mg total) under the tongue every 5 (five) minutes as needed for chest pain. 10/31/21  Yes Satira Sark, MD  Omega-3 1000 MG CAPS Take 1,000 mg by mouth daily.   Yes [provider]  sertraline (ZOLOFT) 50 MG tablet TAKE 1 TABLET BY MOUTH DAILY 07/05/19  Yes Ailene Ards, NP  tamsulosin (FLOMAX) 0.4 MG CAPS capsule Take 1 capsule (0.4 mg total) by mouth daily. Patient taking differently: Take 0.4 mg by mouth at bedtime. 11/07/19  Yes McKenzie, Candee Furbish, MD  TOUJEO MAX SOLOSTAR 300 UNIT/ML Solostar Pen INJECT 60 UNITS INTO THE SKIN AT BEDTIME. 08/22/21  Yes Nida, Marella Chimes, MD  vitamin B-12 (CYANOCOBALAMIN) 1000 MCG tablet Take 1,000 mcg by mouth daily.   Yes [provider]  Continuous Blood Gluc Receiver (FREESTYLE LIBRE 2 READER) DEVI As directed 10/05/20   Cassandria Anger, MD  Continuous Blood Gluc Sensor (FREESTYLE LIBRE 2 SENSOR) MISC USE AS DIRECTED TO CHECK BLOOD SUGAR, REPLACE EVERY 14 DAYS 08/01/21   Nida, Marella Chimes, MD  FREESTYLE PRECISION NEO TEST test strip USE TO TEST BLOOD SUGAR 4 TIMES DAILY. 05/27/21   Cassandria Anger, MD  gabapentin (NEURONTIN) 300 MG capsule Take 1 capsule (300 mg total) by mouth at bedtime as needed. Patient not taking: Reported on 10/29/2022 10/12/22   Frann Rider, NP  GVOKE HYPOPEN 2-PACK 1 MG/0.2ML SOAJ Inject into the skin. Patient not taking: Reported on 10/29/2022 07/24/22   [provider]  Insulin Pen Needle 32G X 4 MM MISC 1 Device by Does not apply route daily. Use as directed to inject insulin four times daily 10/28/20   Cassandria Anger, MD  metoprolol tartrate (LOPRESSOR) 25 MG tablet Take 25 mg by mouth 2 (two) times daily. Patient not taking: Reported on 10/29/2022    [provider]  traMADol (ULTRAM) 50  MG tablet Take 50 mg by mouth 4 (four) times daily as needed for severe pain. Patient not taking: Reported on 10/29/2022 03/18/20   [provider]     Allergies:     Allergies  Allergen Reactions   Demerol [Meperidine Hcl] Other (See Comments)    convulsions     Physical Exam:   Vitals  Blood pressure (!) 100/59, pulse 84, temperature 98.8 F (37.1 C), temperature source Oral, resp. rate 20, height '5\' 10"'$  (1.778 m), weight 99.8 kg, SpO2 95 %.  Physical Examination: General appearance - alert, ill appearing Mental status - alert, oriented to person, place, and time,  Eyes - sclera anicteric Neck - supple, no JVD elevation , Chest -diminished breath sounds, scattered rhonchi bilaterally  heart - S1 and S2 normal, regular, prior sternotomy scar Abdomen - soft, nontender, nondistended, +BS Neurological - screening mental status exam normal, neck supple without rigidity, cranial nerves II through XII intact, DTR's normal and symmetric Extremities - no pedal edema noted, intact peripheral pulses  Skin - warm, dry     Data Review:    CBC Recent Labs  Lab 10/29/22 0835  WBC 2.1*  HGB 12.5*  HCT 35.3*  PLT 81*  MCV 102.0*  MCH 36.1*  MCHC 35.4  RDW 16.9*  LYMPHSABS 1.0  MONOABS 0.0*  EOSABS 0.0  BASOSABS 0.0   ------------------------------------------------------------------------------------------------------------------  Chemistries  Recent Labs  Lab 10/29/22 0835  NA 131*  K 3.9  CL 103  CO2 20*  GLUCOSE 245*  BUN 18  CREATININE 0.92  CALCIUM 8.3*  AST 18  ALT 19  ALKPHOS 57  BILITOT 1.9*   ------------------------------------------------------------------------------------------------------------------ estimated creatinine clearance is 92.2 mL/min (by C-G formula based on SCr of 0.92  mg/dL). ------------------------------------------------------------------------------------------------------------------ --------------------------------------------------------------------------------------------------------------  Urinalysis    Component Value Date/Time   COLORURINE YELLOW 10/29/2022 0952   APPEARANCEUR CLOUDY (A) 10/29/2022 0952   LABSPEC 1.021 10/29/2022 0952   PHURINE 5.0 10/29/2022 0952   GLUCOSEU >=500 (A) 10/29/2022 0952   HGBUR MODERATE (A) 10/29/2022 0952   BILIRUBINUR NEGATIVE 10/29/2022 0952   KETONESUR 5 (A) 10/29/2022 0952   PROTEINUR 100 (A) 10/29/2022 0952   NITRITE NEGATIVE 10/29/2022 0952   LEUKOCYTESUR MODERATE (A) 10/29/2022 0952  ----------------------------------------------------------------------------------------------------------------   Imaging Results:    DG Chest Port 1 View  Result Date: 10/29/2022 CLINICAL DATA:  67 year old male with history of shortness of breath. EXAM: PORTABLE CHEST 1 VIEW COMPARISON:  Chest x-ray 06/20/2021. FINDINGS: Lung volumes are normal. Diffuse interstitial prominence and widespread peribronchial cuffing, which appears similar to prior study from 2022. Ill-defined opacity in the periphery of the left lung base which could reflect developing airspace consolidation. No pleural effusions. No pneumothorax. No pulmonary nodule or mass noted. Pulmonary vasculature and the cardiomediastinal silhouette are within normal limits. Atherosclerosis in the thoracic aorta. Status post median sternotomy for CABG. IMPRESSION: 1. The appearance of the lungs suggests chronic bronchitis. There is an ill-defined opacity in the periphery of the left lung base which may suggest developing bronchopneumonia. Followup PA and lateral chest X-ray is recommended in 3-4 weeks following trial of antibiotic therapy to ensure resolution and exclude underlying malignancy. 2. Aortic atherosclerosis. 3. Electronically Signed   By: Vinnie Langton  M.D.   On: 10/29/2022 09:38   CT Renal Stone Study  Result Date: 10/29/2022 CLINICAL DATA:  Abdominal and flank pain beginning last night. Fever and chills. Dysuria. Previous distal pancreatectomy and splenectomy for pancreatic carcinoma. * Tracking Code: BO * EXAM: CT ABDOMEN AND PELVIS WITHOUT CONTRAST TECHNIQUE: Multidetector CT imaging of the abdomen and pelvis was performed following the standard protocol without IV contrast. RADIATION DOSE REDUCTION: This exam was performed according to the departmental dose-optimization program which includes automated exposure control, adjustment of the mA and/or kV according to patient size and/or use of iterative reconstruction technique. COMPARISON:  06/13/2022 and prior studies dating back to 03/24/2020 FINDINGS: Lower chest: No acute findings. Hepatobiliary: No mass visualized on this unenhanced exam. Small calcified gallstone is again seen, however there is no evidence of cholecystitis or biliary dilatation. Pancreas: Stable postop changes from distal pancreatectomy. Stable cystic lesion in the pancreatic uncinate process measuring 1.4 cm. No new or enlarging pancreatic lesions identified on this unenhanced exam. Spleen:  Prior splenectomy. Adrenals/Urinary tract: No evidence of urolithiasis or hydronephrosis. Diffuse bladder wall thickening is seen as well as left-sided bladder diverticulum, without significant change. This is consistent with chronic cystitis and/or bladder outlet obstruction. Gas is noted in the urinary bladder, likely from recent instrumentation. Stomach/Bowel: No evidence of obstruction, inflammatory process, or abnormal fluid collections. Colonic diverticulosis is noted, without evidence of diverticulitis. Vascular/Lymphatic: No pathologically enlarged lymph nodes identified. No evidence of abdominal aortic aneurysm. Aortic atherosclerotic calcification incidentally noted. Reproductive:  Stable mild-to-moderately enlarged prostate. Other:  Multiple small ventral abdominal wall hernias are again seen which contain only fat, and are unchanged. Musculoskeletal:  No suspicious bone lesions identified. IMPRESSION: No evidence of urolithiasis, hydronephrosis, or other acute findings. Stable findings of chronic cystitis and/or bladder outlet obstruction. Gas in  urinary bladder is likely from recent instrumentation; clinical correlation and correlation with urinalysis is recommended. Stable enlarged prostate. No evidence of recurrent or metastatic pancreatic carcinoma. Stable 1.4 cm cystic lesion in pancreatic uncinate process. Recommend continued attention on follow-up imaging. Cholelithiasis. No radiographic evidence of cholecystitis. Colonic diverticulosis, without radiographic evidence of diverticulitis. Multiple small ventral abdominal wall hernias, which contain only fat, without significant change. Electronically Signed   By: Marlaine Hind M.D.   On: 10/29/2022 09:37    Radiological Exams on Admission: DG Chest Port 1 View  Result Date: 10/29/2022 CLINICAL DATA:  67 year old male with history of shortness of breath. EXAM: PORTABLE CHEST 1 VIEW COMPARISON:  Chest x-ray 06/20/2021. FINDINGS: Lung volumes are normal. Diffuse interstitial prominence and widespread peribronchial cuffing, which appears similar to prior study from 2022. Ill-defined opacity in the periphery of the left lung base which could reflect developing airspace consolidation. No pleural effusions. No pneumothorax. No pulmonary nodule or mass noted. Pulmonary vasculature and the cardiomediastinal silhouette are within normal limits. Atherosclerosis in the thoracic aorta. Status post median sternotomy for CABG. IMPRESSION: 1. The appearance of the lungs suggests chronic bronchitis. There is an ill-defined opacity in the periphery of the left lung base which may suggest developing bronchopneumonia. Followup PA and lateral chest X-ray is recommended in 3-4 weeks following trial of  antibiotic therapy to ensure resolution and exclude underlying malignancy. 2. Aortic atherosclerosis. 3. Electronically Signed   By: Vinnie Langton M.D.   On: 10/29/2022 09:38   CT Renal Stone Study  Result Date: 10/29/2022 CLINICAL DATA:  Abdominal and flank pain beginning last night. Fever and chills. Dysuria. Previous distal pancreatectomy and splenectomy for pancreatic carcinoma. * Tracking Code: BO * EXAM: CT ABDOMEN AND PELVIS WITHOUT CONTRAST TECHNIQUE: Multidetector CT imaging of the abdomen and pelvis was performed following the standard protocol without IV contrast. RADIATION DOSE REDUCTION: This exam was performed according to the departmental dose-optimization program which includes automated exposure control, adjustment of the mA and/or kV according to patient size and/or use of iterative reconstruction technique. COMPARISON:  06/13/2022 and prior studies dating back to 03/24/2020 FINDINGS: Lower chest: No acute findings. Hepatobiliary: No mass visualized on this unenhanced exam. Small calcified gallstone is again seen, however there is no evidence of cholecystitis or biliary dilatation. Pancreas: Stable postop changes from distal pancreatectomy. Stable cystic lesion in the pancreatic uncinate process measuring 1.4 cm. No new or enlarging pancreatic lesions identified on this unenhanced exam. Spleen:  Prior splenectomy. Adrenals/Urinary tract: No evidence of urolithiasis or hydronephrosis. Diffuse bladder wall thickening is seen as well as left-sided bladder diverticulum, without significant change. This is consistent with chronic cystitis and/or bladder outlet obstruction. Gas is noted in the urinary bladder, likely from recent instrumentation. Stomach/Bowel: No evidence of obstruction, inflammatory process, or abnormal fluid collections. Colonic diverticulosis is noted, without evidence of diverticulitis. Vascular/Lymphatic: No pathologically enlarged lymph nodes identified. No evidence of  abdominal aortic aneurysm. Aortic atherosclerotic calcification incidentally noted. Reproductive:  Stable mild-to-moderately enlarged prostate. Other: Multiple small ventral abdominal wall hernias are again seen which contain only fat, and are unchanged. Musculoskeletal:  No suspicious bone lesions identified. IMPRESSION: No evidence of urolithiasis, hydronephrosis, or other acute findings. Stable findings of chronic cystitis and/or bladder outlet obstruction. Gas in urinary bladder is likely from recent instrumentation; clinical correlation and correlation with urinalysis is recommended. Stable enlarged prostate. No evidence of recurrent or metastatic pancreatic carcinoma. Stable 1.4 cm cystic lesion in pancreatic uncinate process. Recommend continued attention on follow-up imaging. Cholelithiasis.  No radiographic evidence of cholecystitis. Colonic diverticulosis, without radiographic evidence of diverticulitis. Multiple small ventral abdominal wall hernias, which contain only fat, without significant change. Electronically Signed   By: Marlaine Hind M.D.   On: 10/29/2022 09:37    DVT Prophylaxis -SCD/heparin AM Labs Ordered, also please review Full Orders  Family Communication: Admission, patients condition and plan of care including tests being ordered have been discussed with the patient who indicate understanding and agree with the plan   Condition -stable  Roxan Hockey M.D on 10/29/2022 at 4:12 PM Go to [email or web address] -  for contact info  [facility] - Office  [phone]

## 2022-10-29 NOTE — ED Provider Notes (Signed)
[facility] Provider Note   CSN: [id number] Arrival date & time: 10/29/22  [id number]     History  Chief Complaint  Patient presents with   Fever    Mark Foster is a 67 y.o. male.   Fever    Patient with medical history of diabetes, hypothyroid, CAD presents to the emergency department due to fever and chills.  Symptoms started 2 days ago, he has also been having a cough, feeling SOB, difficulty urinating and decreased stream of urination.  Endorses suprapubic pain as well as bilateral flank pain without hematuria.  No antipyretics prior to arrival, denies any chest pain, vomiting, diarrhea.  Home Medications Prior to Admission medications   Medication Sig Start Date End Date Taking? Authorizing Provider  aspirin EC 81 MG EC tablet Take 1 tablet (81 mg total) by mouth daily. 01/31/20   Elgie Collard, PA-C  atorvastatin (LIPITOR) 80 MG tablet Take 1 tablet (80 mg total) by mouth daily at 6 PM. 05/03/20   Doree Albee, MD  Continuous Blood Gluc Receiver (FREESTYLE LIBRE 2 READER) DEVI As directed 10/05/20   Cassandria Anger, MD  Continuous Blood Gluc Sensor (FREESTYLE LIBRE 2 SENSOR) MISC USE AS DIRECTED TO CHECK BLOOD SUGAR, REPLACE EVERY 14 DAYS 08/01/21   Nida, Marella Chimes, MD  [facility] COMPLEX PO Take 1,000 mg by mouth daily.    [provider]  ezetimibe (ZETIA) 10 MG tablet Take by mouth. 10/18/21   [provider]  FREESTYLE PRECISION NEO TEST test strip USE TO TEST BLOOD SUGAR 4 TIMES DAILY. 05/27/21   Cassandria Anger, MD  gabapentin (NEURONTIN) 300 MG capsule Take 1 capsule (300 mg total) by mouth 3 (three) times daily. 10/12/22   Frann Rider, NP  gabapentin (NEURONTIN) 300 MG capsule Take 1 capsule (300 mg total) by mouth at bedtime as needed. 10/12/22   Frann Rider, NP  insulin aspart (NOVOLOG FLEXPEN) 100 UNIT/ML FlexPen Inject 14-20 Units into the skin 3 (three) times daily before  meals. Patient taking differently: Inject 18-24 Units into the skin 3 (three) times daily before meals. 05/12/21   Cassandria Anger, MD  Insulin Pen Needle 32G X 4 MM MISC 1 Device by Does not apply route daily. Use as directed to inject insulin four times daily 10/28/20   Cassandria Anger, MD  levothyroxine (SYNTHROID) 50 MCG tablet TAKE 1 TABLET EVERY DAY 08/14/20   Hurshel Party C, MD  metoprolol tartrate (LOPRESSOR) 25 MG tablet Take 25 mg by mouth 2 (two) times daily.    [provider]  nitroGLYCERIN (NITROSTAT) 0.4 MG SL tablet Place 1 tablet (0.4 mg total) under the tongue every 5 (five) minutes as needed for chest pain. 10/31/21   Satira Sark, MD  Omega-3 1000 MG CAPS Take 1,000 mg by mouth daily.    [provider]  sertraline (ZOLOFT) 50 MG tablet TAKE 1 TABLET BY MOUTH DAILY 07/05/19   Ailene Ards, NP  tamsulosin (FLOMAX) 0.4 MG CAPS capsule Take 1 capsule (0.4 mg total) by mouth daily. Patient taking differently: Take 0.4 mg by mouth at bedtime. 11/07/19   McKenzie, Candee Furbish, MD  TOUJEO MAX SOLOSTAR 300 UNIT/ML Solostar Pen INJECT 60 UNITS INTO THE SKIN AT BEDTIME. 08/22/21   Nida, Marella Chimes, MD  traMADol (ULTRAM) 50 MG tablet Take 50 mg by mouth 4 (four) times daily as needed for severe pain. 03/18/20   [provider]  vitamin B-12 (  CYANOCOBALAMIN) 1000 MCG tablet Take 1,000 mcg by mouth daily.    [provider]      Allergies    Demerol [meperidine hcl]    Review of Systems   Review of Systems  Constitutional:  Positive for fever.    Physical Exam Updated Vital Signs BP (!) 140/59   Pulse 87   Temp (!) 101 F (38.3 C) (Rectal)   Resp (!) 22   Ht '5\' 10"'$  (1.778 m)   Wt 99.8 kg   SpO2 98%   BMI 31.57 kg/m  Physical Exam Vitals and nursing note reviewed. Exam conducted with a chaperone present.  Constitutional:      Appearance: Normal appearance. He is ill-appearing.  HENT:     Head: Normocephalic and  atraumatic.     Mouth/Throat:     Mouth: Mucous membranes are dry.  Eyes:     General: No scleral icterus.       Right eye: No discharge.        Left eye: No discharge.     Extraocular Movements: Extraocular movements intact.     Pupils: Pupils are equal, round, and reactive to light.  Cardiovascular:     Rate and Rhythm: Normal rate and regular rhythm.     Pulses: Normal pulses.     Heart sounds: Normal heart sounds. No murmur heard.    No friction rub. No gallop.  Pulmonary:     Effort: Pulmonary effort is normal. No respiratory distress.     Breath sounds: Normal breath sounds.  Abdominal:     General: Abdomen is flat. Bowel sounds are normal. There is no distension.     Palpations: Abdomen is soft.     Tenderness: There is abdominal tenderness.     Comments: Mild suprapubic tenderness  Skin:    General: Skin is warm and dry.     Coloration: Skin is not jaundiced.  Neurological:     Mental Status: He is alert. Mental status is at baseline.     Coordination: Coordination normal.     ED Results / Procedures / Treatments   Labs (all labs ordered are listed, but only abnormal results are displayed) Labs Reviewed  BASIC METABOLIC PANEL - Abnormal; Notable for the following components:      Result Value   Sodium 131 (*)    CO2 20 (*)    Glucose, Bld 245 (*)    Calcium 8.3 (*)    All other components within normal limits  CBC - Abnormal; Notable for the following components:   WBC 2.1 (*)    RBC 3.46 (*)    Hemoglobin 12.5 (*)    HCT 35.3 (*)    MCV 102.0 (*)    MCH 36.1 (*)    RDW 16.9 (*)    Platelets 81 (*)    All other components within normal limits  URINALYSIS, ROUTINE W REFLEX MICROSCOPIC - Abnormal; Notable for the following components:   APPearance CLOUDY (*)    Glucose, UA >=500 (*)    Hgb urine dipstick MODERATE (*)    Ketones, ur 5 (*)    Protein, ur 100 (*)    Leukocytes,Ua MODERATE (*)    Bacteria, UA FEW (*)    All other components within normal  limits  HEPATIC FUNCTION PANEL - Abnormal; Notable for the following components:   Albumin 3.4 (*)    Total Bilirubin 1.9 (*)    Bilirubin, Direct 0.3 (*)    Indirect Bilirubin 1.6 (*)  All other components within normal limits  DIFFERENTIAL - Abnormal; Notable for the following components:   Neutro Abs 1.0 (*)    Monocytes Absolute 0.0 (*)    Abs Immature Granulocytes 0.10 (*)    All other components within normal limits  CBG MONITORING, ED - Abnormal; Notable for the following components:   Glucose-Capillary 230 (*)    All other components within normal limits  RESP PANEL BY RT-PCR (RSV, FLU A&B, COVID)  RVPGX2  CULTURE, BLOOD (ROUTINE X 2)  CULTURE, BLOOD (ROUTINE X 2)  LACTIC ACID, PLASMA  LACTIC ACID, PLASMA  URINALYSIS, W/ REFLEX TO CULTURE (INFECTION SUSPECTED)  HEMOGLOBIN A1C    EKG None  Radiology DG Chest Port 1 View  Result Date: 10/29/2022 CLINICAL DATA:  67 year old male with history of shortness of breath. EXAM: PORTABLE CHEST 1 VIEW COMPARISON:  Chest x-ray 06/20/2021. FINDINGS: Lung volumes are normal. Diffuse interstitial prominence and widespread peribronchial cuffing, which appears similar to prior study from 2022. Ill-defined opacity in the periphery of the left lung base which could reflect developing airspace consolidation. No pleural effusions. No pneumothorax. No pulmonary nodule or mass noted. Pulmonary vasculature and the cardiomediastinal silhouette are within normal limits. Atherosclerosis in the thoracic aorta. Status post median sternotomy for CABG. IMPRESSION: 1. The appearance of the lungs suggests chronic bronchitis. There is an ill-defined opacity in the periphery of the left lung base which may suggest developing bronchopneumonia. Followup PA and lateral chest X-ray is recommended in 3-4 weeks following trial of antibiotic therapy to ensure resolution and exclude underlying malignancy. 2. Aortic atherosclerosis. 3. Electronically Signed   By: Vinnie Langton M.D.   On: 10/29/2022 09:38   CT Renal Stone Study  Result Date: 10/29/2022 CLINICAL DATA:  Abdominal and flank pain beginning last night. Fever and chills. Dysuria. Previous distal pancreatectomy and splenectomy for pancreatic carcinoma. * Tracking Code: BO * EXAM: CT ABDOMEN AND PELVIS WITHOUT CONTRAST TECHNIQUE: Multidetector CT imaging of the abdomen and pelvis was performed following the standard protocol without IV contrast. RADIATION DOSE REDUCTION: This exam was performed according to the departmental dose-optimization program which includes automated exposure control, adjustment of the mA and/or kV according to patient size and/or use of iterative reconstruction technique. COMPARISON:  06/13/2022 and prior studies dating back to 03/24/2020 FINDINGS: Lower chest: No acute findings. Hepatobiliary: No mass visualized on this unenhanced exam. Small calcified gallstone is again seen, however there is no evidence of cholecystitis or biliary dilatation. Pancreas: Stable postop changes from distal pancreatectomy. Stable cystic lesion in the pancreatic uncinate process measuring 1.4 cm. No new or enlarging pancreatic lesions identified on this unenhanced exam. Spleen:  Prior splenectomy. Adrenals/Urinary tract: No evidence of urolithiasis or hydronephrosis. Diffuse bladder wall thickening is seen as well as left-sided bladder diverticulum, without significant change. This is consistent with chronic cystitis and/or bladder outlet obstruction. Gas is noted in the urinary bladder, likely from recent instrumentation. Stomach/Bowel: No evidence of obstruction, inflammatory process, or abnormal fluid collections. Colonic diverticulosis is noted, without evidence of diverticulitis. Vascular/Lymphatic: No pathologically enlarged lymph nodes identified. No evidence of abdominal aortic aneurysm. Aortic atherosclerotic calcification incidentally noted. Reproductive:  Stable mild-to-moderately enlarged prostate.  Other: Multiple small ventral abdominal wall hernias are again seen which contain only fat, and are unchanged. Musculoskeletal:  No suspicious bone lesions identified. IMPRESSION: No evidence of urolithiasis, hydronephrosis, or other acute findings. Stable findings of chronic cystitis and/or bladder outlet obstruction. Gas in urinary bladder is likely from recent instrumentation; clinical correlation and correlation  with urinalysis is recommended. Stable enlarged prostate. No evidence of recurrent or metastatic pancreatic carcinoma. Stable 1.4 cm cystic lesion in pancreatic uncinate process. Recommend continued attention on follow-up imaging. Cholelithiasis. No radiographic evidence of cholecystitis. Colonic diverticulosis, without radiographic evidence of diverticulitis. Multiple small ventral abdominal wall hernias, which contain only fat, without significant change. Electronically Signed   By: Marlaine Hind M.D.   On: 10/29/2022 09:37    Procedures Procedures    Medications Ordered in ED Medications  cefTRIAXone (ROCEPHIN) 1 g in sodium chloride 0.9 % 100 mL IVPB (has no administration in time range)  azithromycin (ZITHROMAX) 500 mg in sodium chloride 0.9 % 250 mL IVPB (500 mg Intravenous New Bag/Given 10/29/22 1040)  0.9 %  sodium chloride infusion (has no administration in time range)  insulin aspart (novoLOG) injection 0-9 Units (has no administration in time range)  insulin aspart (novoLOG) injection 0-5 Units (has no administration in time range)  sodium chloride 0.9 % bolus 1,000 mL (1,000 mLs Intravenous New Bag/Given 10/29/22 1042)  acetaminophen (TYLENOL) tablet 1,000 mg (1,000 mg Oral Given 10/29/22 1041)    ED Course/ Medical Decision Making/ A&P Clinical Course as of 10/29/22 1129  Sun Oct 29, 2022  1124 Blood culture (routine x 2) [HS]    Clinical Course User Index [HS] Sherrill Raring, PA-C                             Medical Decision Making Amount and/or Complexity of Data  Reviewed Labs: ordered. Decision-making details documented in ED Course. Radiology: ordered.  Risk OTC drugs. Decision regarding hospitalization.   This is a 67 year old male presenting to the emergency department due to fevers.  Differential includes but not limited to pneumonia, UTI, pyelonephritis, for lithiasis, sepsis, obstructive uropathy, dehydration, AKI.  Patient's wife is at bedside providing independent history, also reviewed external medical records patient had a port removed 2022 status post chemotherapy.  Chest x-ray is notable for bronchopneumonia, CT renal study is negative for acute nephrolithiasis.  UA is notable for moderate leukocytes consistent with a UTI as well as an underlying pneumonia.  Patient is pancytopenic on the CBC, no AKI.  EKG shows no new arrhythmia.  I ordered fluids, Rocephin, azithromycin.  Patient's lactic is negative, blood culture was obtained and pending.  Clinically not septic although he meets SIRS criteria.  I think patient needs admission for pneumonia and UTI, I discussed with hospitalist Dr. Denton Brick who agrees with admission.        Final Clinical Impression(s) / ED Diagnoses Final diagnoses:  Community acquired pneumonia, unspecified laterality  Acute cystitis without hematuria    Rx / DC Orders ED Discharge Orders     None         Sherrill Raring, Hershal Coria 10/29/22 1129    [place] Ferguson, MD 10/31/22 1504

## 2022-10-30 DIAGNOSIS — A419 Sepsis, unspecified organism: Secondary | ICD-10-CM | POA: Diagnosis not present

## 2022-10-30 DIAGNOSIS — N390 Urinary tract infection, site not specified: Secondary | ICD-10-CM | POA: Diagnosis not present

## 2022-10-30 LAB — COMPREHENSIVE METABOLIC PANEL
ALT: 20 U/L (ref 0–44)
AST: 19 U/L (ref 15–41)
Albumin: 3 g/dL — ABNORMAL LOW (ref 3.5–5.0)
Alkaline Phosphatase: 60 U/L (ref 38–126)
Anion gap: 8 (ref 5–15)
BUN: 20 mg/dL (ref 8–23)
CO2: 19 mmol/L — ABNORMAL LOW (ref 22–32)
Calcium: 7.7 mg/dL — ABNORMAL LOW (ref 8.9–10.3)
Chloride: 108 mmol/L (ref 98–111)
Creatinine, Ser: 0.74 mg/dL (ref 0.61–1.24)
GFR, Estimated: >60 mL/min (ref >60)
Glucose, Bld: 299 mg/dL — ABNORMAL HIGH (ref 70–99)
Potassium: 4.1 mmol/L (ref 3.5–5.1)
Sodium: 135 mmol/L (ref 135–145)
Total Bilirubin: 1.4 mg/dL — ABNORMAL HIGH (ref 0.3–1.2)
Total Protein: 6.2 g/dL — ABNORMAL LOW (ref 6.5–8.1)

## 2022-10-30 LAB — CBC
HCT: 36.7 % — ABNORMAL LOW (ref 39.0–52.0)
Hemoglobin: 12.6 g/dL — ABNORMAL LOW (ref 13.0–17.0)
MCH: 35.5 pg — ABNORMAL HIGH (ref 26.0–34.0)
MCHC: 34.3 g/dL (ref 30.0–36.0)
MCV: 103.4 fL — ABNORMAL HIGH (ref 80.0–100.0)
Platelets: 72 10*3/uL — ABNORMAL LOW (ref 150–400)
RBC: 3.55 MIL/uL — ABNORMAL LOW (ref 4.22–5.81)
RDW: 17 % — ABNORMAL HIGH (ref 11.5–15.5)
WBC: 1.7 10*3/uL — ABNORMAL LOW (ref 4.0–10.5)
nRBC: 0 % (ref 0.0–0.2)

## 2022-10-30 LAB — MAGNESIUM: Magnesium: 2.1 mg/dL (ref 1.7–2.4)

## 2022-10-30 LAB — GLUCOSE, CAPILLARY
Glucose-Capillary: 324 mg/dL — ABNORMAL HIGH (ref 70–99)
Glucose-Capillary: 357 mg/dL — ABNORMAL HIGH (ref 70–99)
Glucose-Capillary: 400 mg/dL — ABNORMAL HIGH (ref 70–99)
Glucose-Capillary: 338 mg/dL — ABNORMAL HIGH (ref 70–99)

## 2022-10-30 LAB — HIV ANTIBODY (ROUTINE TESTING W REFLEX): HIV Screen 4th Generation wRfx: NONREACTIVE

## 2022-10-30 LAB — C-REACTIVE PROTEIN: CRP: 17.6 mg/dL — ABNORMAL HIGH (ref <1.0)

## 2022-10-30 LAB — FERRITIN: Ferritin: 263 ng/mL (ref 24–336)

## 2022-10-30 LAB — PHOSPHORUS: Phosphorus: 2.7 mg/dL (ref 2.5–4.6)

## 2022-10-30 MED ORDER — INSULIN GLARGINE-YFGN 100 UNIT/ML ~~LOC~~ SOLN
35.0000 [IU] | Freq: Two times a day (BID) | SUBCUTANEOUS | Status: DC
  Administered 2022-10-30: 35 [IU] via SUBCUTANEOUS
  Filled 2022-10-30 (×3): qty 0.35

## 2022-10-30 MED ORDER — INSULIN ASPART 100 UNIT/ML IJ SOLN
24.0000 [IU] | Freq: Once | INTRAMUSCULAR | Status: AC
  Administered 2022-10-30: 24 [IU] via SUBCUTANEOUS

## 2022-10-30 MED ORDER — INSULIN GLARGINE-YFGN 100 UNIT/ML ~~LOC~~ SOLN
40.0000 [IU] | Freq: Two times a day (BID) | SUBCUTANEOUS | Status: DC
  Filled 2022-10-30 (×2): qty 0.4

## 2022-10-30 MED ORDER — INSULIN GLARGINE-YFGN 100 UNIT/ML ~~LOC~~ SOLN
50.0000 [IU] | Freq: Two times a day (BID) | SUBCUTANEOUS | Status: DC
  Administered 2022-10-30: 50 [IU] via SUBCUTANEOUS
  Filled 2022-10-30 (×4): qty 0.5

## 2022-10-30 MED ORDER — IPRATROPIUM-ALBUTEROL 20-100 MCG/ACT IN AERS
1.0000 | INHALATION_SPRAY | Freq: Two times a day (BID) | RESPIRATORY_TRACT | Status: DC
  Administered 2022-10-30 – 2022-10-31 (×2): 1 via RESPIRATORY_TRACT

## 2022-10-30 NOTE — TOC Initial Note (Signed)
Transition of Care [facility]) - Initial/Assessment Note    Patient Details  Name: Mark Foster MRN: [id number] Date of Birth: 26-Jan-1956  Transition of Care [facility]) CM/SW Contact:    Iona Beard, [place] Phone Number: 10/30/2022, 11:47 AM  Clinical Narrative:                 Pt is high risk for readmission. CSW spoke with pt to complete assessment. Pt states that he lives with his wife. Pt is independent in completing ADLs. Pt is able to drive to appointments when needed. Pt has had HH in the past. Pt does not use any DME to ambulate but has a cane and walker to use if needed. TOC to follow. For needs.   Expected Discharge Plan: Home/Self Care Barriers to Discharge: Continued Medical Work up   Patient Goals and CMS Choice Patient states their goals for this hospitalization and ongoing recovery are:: return home CMS Medicare.gov Compare Post Acute Care list provided to:: Patient Choice offered to / list presented to : Patient      Expected Discharge Plan and Services In-house Referral: Clinical Social Work Discharge Planning Services: CM Consult   Living arrangements for the past 2 months: Single Family Home                                      Prior Living Arrangements/Services Living arrangements for the past 2 months: Single Family Home Lives with:: Spouse Patient language and need for interpreter reviewed:: Yes Do you feel safe going back to the place where you live?: Yes      Need for Family Participation in Patient Care: Yes (Comment) Care giver support system in place?: Yes (comment) Current home services: DME Criminal Activity/Legal Involvement Pertinent to Current Situation/Hospitalization: No - Comment as needed  Activities of Daily Living      Permission Sought/Granted                  Emotional Assessment Appearance:: Appears stated age Attitude/Demeanor/Rapport: Engaged Affect (typically observed): Accepting Orientation: : Oriented to Self,  Oriented to Place, Oriented to  Time, Oriented to Situation Alcohol / Substance Use: Not Applicable Psych Involvement: No (comment)  Admission diagnosis:  Acute cystitis without hematuria [N30.00] Sepsis due to urinary tract infection ([place]) [A41.9, N39.0] Community acquired pneumonia, unspecified laterality [J18.9] Patient Active Problem List   Diagnosis Date Noted   Sepsis due to urinary tract infection ([place]) 10/29/2022   COVID-19 virus infection 10/29/2022   Bilateral carpal tunnel syndrome 10/19/2020   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) 09/03/2018   H/O ETOH abuse 09/03/2018   Hyperbilirubinemia 09/03/2018   Vertebral artery stenosis, symptomatic, without infarction, left 03/21/2018   Uncontrolled type 2 diabetes mellitus with hyperglycemia ([place]) 03/05/2018   Hypothyroidism 03/05/2018   Intracranial atherosclerosis 01/22/2018   Spinal stenosis in cervical region    Diabetes mellitus type 2 in nonobese [facility])    Left carotid artery occlusion 01/21/2018   Cervical stenosis of spinal  canal 01/21/2018   Right hand weakness 01/21/2018   Numbness of right hand 01/21/2018   Essential hypertension, benign 01/21/2018   Mixed hyperlipidemia 01/21/2018   Diabetes ([place]) 01/21/2018   Hx of completed stroke 01/21/2018   Leukopenia 01/21/2018   Thrombocytopenia ([place]) 01/21/2018   L MCA Stroke-like episode ([place]) s/p IV tPA 01/19/2018   PCP:  Celene Squibb, MD Pharmacy:   [facility] [address] [place] [address] Phone:  [phone] Fax: [phone]     Social Determinants of Health (SDOH) Social History: SDOH Screenings   Food Insecurity: No Food Insecurity (12/18/2018)  Housing: Low Risk  (08/16/2020)  Transportation Needs: No Transportation Needs (12/18/2018)  Alcohol Screen: Low Risk  (08/16/2020)  Depression (PHQ2-9): Low Risk  (07/12/2020)  Financial Resource Strain: Medium Risk (12/18/2018)  Physical Activity: Inactive (12/18/2018)  Social Connections: Somewhat Isolated (12/18/2018)  Stress: Stress Concern Present (12/18/2018)  Tobacco Use: Medium Risk (10/29/2022)   SDOH Interventions:     Readmission Risk Interventions    10/30/2022   11:37 AM  Readmission Risk Prevention Plan  Transportation Screening Complete  HRI or [place] Complete  Social Work Consult for [place] Planning/Counseling Complete  Palliative Care Screening Not Applicable  Medication Review Press photographer) Complete

## 2022-10-30 NOTE — Progress Notes (Signed)
PROGRESS NOTE     Mark Foster, is a 67 y.o. male, DOB - [birth year], [place]:[id number]  Admit date - 10/29/2022   Admitting Physician Briah Nary Denton Brick, MD  Outpatient Primary MD for the patient is Celene Squibb, MD  LOS - 1  Chief Complaint  Patient presents with   Fever        Brief Narrative:  67 y.o. male with past medical history relevant for CAD with prior CABG x 4 (01/2020: CABG x4 with LIMA-LAD, SVG-OM, SVG-PDA and SVG-D1), PAD with history of left ICA occlusion, DM2, hypothyroidism,, history of prior stroke, history of Stage IIb (T2N1)  pancreatic cancer (s/p  Distal pancreatectomy and splenectomy at [facility] on 11/20/2018 followed by Adjuvant FOLFIRINOX x 12 cycles from 12/30/2018 to 06/04/2019, followed by palonosetron (ALOXI))  , history of prior left upper extremity DVT resolved after Port-A-Cath admitted on 10/29/2022 with abscess secondary to E. coli UTI and pneumonia, as well as concomitant COVID-19 infection   -Assessment and Plan: 1)Sepsis due to E. coli UTI and CAP-----Bacterial Pneumonia Versus COVID-related Pneumonia --Chest x-ray with Bronchitis and possible bronchial Pneumonia -Patient tested positive for COVID, negative for flu and negative for RSV --He is Not vaccinated against COVID -Rocephin/azithromycin as ordered -Steroids for presumed COVID-related pneumonia -Bronchodilators and mucolytics as ordered -Patient is currently not hypoxic -Zinc and vitamin C as ordered 10/30/22 -Overall respiratory status improving---cough persist -No hypoxia   2)Sepsis due to E. Coli UTI--- patient with urinary symptoms as below in HPI IV Rocephin as above pending further urine culture data -- CT renal protocol with finding of chronic cystitis and possible bladder outlet obstruction   3)H/o CAD/PAD---CAD with prior CABG x 4 (01/2020: CABG x4 with LIMA-LAD, SVG-OM, SVG-PDA and SVG-D1), PAD with history of left ICA occlusion,  --No chest pains and palpitations no dizziness -EKG  sinus rhythm without acute findings -Continue aspirin, Lipitor and metoprolol   4)Pancytopenia--- in the setting of underlying malignancy and prior chemotherapy --No Bleeding concerns...  Indices are stable   5)Hyponatremia--- sodium is up 135 -it was 131 on admission-- suspect dehydration related hydrate gently   6)History of Stage IIb (T2N1)  pancreatic cancer (s/p  Distal pancreatectomy and splenectomy at [facility] on 11/20/2018 followed by Adjuvant FOLFIRINOX x 12 cycles from 12/30/2018 to 06/04/2019, followed by palonosetron (ALOXI))  -CT renal stone protocol today without evidence of recurrence -Follow-up with Dr. Delton Coombes as outpatient as previously advised   7)DM2-last A1c greater than 9 previously uncontrolled with hyperglycemia -Anticipate worsening glycemic control with steroids as above #1 -Give Lantus 25 units twice daily -Use Novolog/Humalog Sliding scale insulin with Accu-Cheks/Fingersticks as ordered    8)H/o  prior stroke--stable, no new deficits, continue aspirin and Lipitor for secondary prophylaxis  Status is: Inpatient   Disposition: The patient is from: Home              Anticipated d/c is to: Home              Anticipated d/c date is: 1 day              Patient currently is not medically stable to d/c. Barriers: Not Clinically Stable-   Code Status :  -  Code Status: Full Code   Family Communication:    NA (patient is alert, awake and coherent)   DVT Prophylaxis  :   - SCDs  heparin injection 5,000 Units Start: 10/29/22 2200 SCDs Start: 10/29/22 1457 Place TED hose Start: 10/29/22 1457  Lab Results  Component Value  Date   PLT 72 (L) 10/30/2022   Inpatient Medications  Scheduled Meds:  vitamin C  500 mg Oral Daily   aspirin EC  81 mg Oral Q breakfast   atorvastatin  80 mg Oral q1800   cyanocobalamin  1,000 mcg Oral Daily   dextromethorphan-guaiFENesin  1 tablet Oral BID   ezetimibe  10 mg Oral Daily   folic acid  1 mg Oral Daily   gabapentin  300  mg Oral TID   heparin  5,000 Units Subcutaneous Q8H   insulin aspart  0-20 Units Subcutaneous TID WC   insulin aspart  0-5 Units Subcutaneous QHS   insulin aspart  24 Units Subcutaneous Once   insulin glargine-yfgn  50 Units Subcutaneous BID   Ipratropium-Albuterol  1 puff Inhalation BID   levothyroxine  50 mcg Oral Daily   methylPREDNISolone (SOLU-MEDROL) injection  40 mg Intravenous Q12H   metoprolol succinate  25 mg Oral Daily   multivitamin with minerals  1 tablet Oral Daily   sertraline  50 mg Oral Daily   sodium chloride flush  3 mL Intravenous Q12H   sodium chloride flush  3 mL Intravenous Q12H   tamsulosin  0.4 mg Oral QHS   thiamine  100 mg Oral Daily   zinc sulfate  220 mg Oral Daily   Continuous Infusions:  sodium chloride 10 mL/hr at 10/30/22 1707   sodium chloride     azithromycin 500 mg (10/30/22 1052)   cefTRIAXone (ROCEPHIN)  IV 2 g (10/30/22 0849)   PRN Meds:.sodium chloride, acetaminophen **OR** acetaminophen, bisacodyl, ipratropium-albuterol, ondansetron **OR** ondansetron (ZOFRAN) IV, polyethylene glycol, sodium chloride flush, traZODone  Anti-infectives (From admission, onward)    Start     Dose/Rate Route Frequency Ordered Stop   10/30/22 1000  cefTRIAXone (ROCEPHIN) 2 g in sodium chloride 0.9 % 100 mL IVPB        2 g 200 mL/hr over 30 Minutes Intravenous Every 24 hours 10/29/22 1511     10/30/22 1000  azithromycin (ZITHROMAX) 500 mg in sodium chloride 0.9 % 250 mL IVPB        500 mg 250 mL/hr over 60 Minutes Intravenous Every 24 hours 10/29/22 1511     10/29/22 1000  cefTRIAXone (ROCEPHIN) 1 g in sodium chloride 0.9 % 100 mL IVPB        1 g 200 mL/hr over 30 Minutes Intravenous  Once 10/29/22 0954 10/29/22 1223   10/29/22 1000  azithromycin (ZITHROMAX) 500 mg in sodium chloride 0.9 % 250 mL IVPB        500 mg 250 mL/hr over 60 Minutes Intravenous  Once 10/29/22 0954 10/29/22 1043        Subjective: Dicky Doe today has no fevers, no emesis,  No  chest pain,  Cough persist,  -Dyspnea improving -No hypoxia   Objective: Vitals:   10/29/22 2209 10/30/22 0211 10/30/22 0322 10/30/22 0752  BP: 123/67 120/65 132/81   Pulse: 84 79 93   Resp: '20 20 18   '$ Temp: (!) 97.3 F (36.3 C) (!) 97.5 F (36.4 C) 98.4 F (36.9 C)   TempSrc:  Oral    SpO2: 94% 97% 97% 97%  Weight:      Height:        Intake/Output Summary (Last 24 hours) at 10/30/2022 1724 Last data filed at 10/30/2022 1500 Gross per 24 hour  Intake 3925.3 ml  Output 450 ml  Net 3475.3 ml   Filed Weights   10/29/22 0805  Weight: 99.8 kg  Physical Exam General appearance - alert, in no acute distress Mental status - alert, oriented to person, place, and time,  Eyes - sclera anicteric Neck - supple, no JVD elevation , Chest -improving air movement, no wheezing  heart - S1 and S2 normal, regular, prior sternotomy scar Abdomen - soft, nontender, nondistended, +BS, no CVA area tenderness Neurological - screening mental status exam normal, neck supple without rigidity, cranial nerves II through XII intact, DTR's normal and symmetric Extremities - no pedal edema noted, intact peripheral pulses  Skin - warm, dry  Data Reviewed: I have personally reviewed following labs and imaging studies  CBC: Recent Labs  Lab 10/29/22 0835 10/30/22 0415  WBC 2.1* 1.7*  NEUTROABS 1.0*  --   HGB 12.5* 12.6*  HCT 35.3* 36.7*  MCV 102.0* 103.4*  PLT 81* 72*   Basic Metabolic Panel: Recent Labs  Lab 10/29/22 0835 10/30/22 0415  NA 131* 135  K 3.9 4.1  CL 103 108  CO2 20* 19*  GLUCOSE 245* 299*  BUN 18 20  CREATININE 0.92 0.74  CALCIUM 8.3* 7.7*  MG  --  2.1  PHOS  --  2.7   GFR: Estimated Creatinine Clearance: 106.1 mL/min (by C-G formula based on SCr of 0.74 mg/dL). Liver Function Tests: Recent Labs  Lab 10/29/22 0835 10/30/22 0415  AST 18 19  ALT 19 20  ALKPHOS 57 60  BILITOT 1.9* 1.4*  PROT 6.5 6.2*  ALBUMIN 3.4* 3.0*   Recent Results (from the past  240 hour(s))  Blood culture (routine x 2)     Status: None (Preliminary result)   Collection Time: 10/29/22  9:50 AM   Specimen: Right Antecubital; Blood  Result Value Ref Range Status   Specimen Description   Final    RIGHT ANTECUBITAL BOTTLES DRAWN AEROBIC AND ANAEROBIC   Special Requests   Final    Blood Culture results may not be optimal due to an excessive volume of blood received in culture bottles   Culture   Final    NO GROWTH < 24 HOURS Performed at [facility], [address] [place], [place] [address]    Report Status PENDING  Incomplete  Urine Culture     Status: Abnormal (Preliminary result)   Collection Time: 10/29/22  9:52 AM   Specimen: Urine, Catheterized  Result Value Ref Range Status   Specimen Description   Final    URINE, CATHETERIZED Performed at [facility], [address] [place], [place] [address]    Special Requests   Final    NONE Performed at [facility], [address], [place], [place] [address]    Culture (A)  Final    >=100,000 COLONIES/mL ESCHERICHIA COLI SUSCEPTIBILITIES TO FOLLOW Performed at [facility], [place] [address], [place], [place] [address]    Report Status PENDING  Incomplete  Resp panel by RT-PCR (RSV, Flu A&B, Covid) Anterior Nasal Swab     Status: Abnormal   Collection Time: 10/29/22  9:59 AM   Specimen: Anterior Nasal Swab  Result Value Ref Range Status   SARS Coronavirus 2 by RT PCR POSITIVE (A) NEGATIVE Final    Comment: (NOTE) SARS-CoV-2 target nucleic acids are DETECTED.  The SARS-CoV-2 RNA is generally detectable in upper respiratory specimens during the acute phase of infection. Positive results are indicative of the presence of the identified virus, but do not rule out bacterial infection or co-infection with other pathogens not detected by the test. Clinical correlation with patient history and other  diagnostic information is necessary to determine patient infection status. The expected result is  Negative.  Fact Sheet for Patients: EntrepreneurPulse.com.au  Fact Sheet for Healthcare Providers: IncredibleEmployment.be  This test is not yet approved or cleared by the [place] FDA and  has been authorized for detection and/or diagnosis of SARS-CoV-2 by FDA under an Emergency Use Authorization (EUA).  This EUA will remain in effect (meaning this test can be used) for the duration of  the COVID-19 declaration under Section 564(b)(1) of the A ct, 21 U.S.C. section 360bbb-3(b)(1), unless the authorization is terminated or revoked sooner.     Influenza A by PCR NEGATIVE NEGATIVE Final   Influenza B by PCR NEGATIVE NEGATIVE Final    Comment: (NOTE) The Xpert Xpress SARS-CoV-2/FLU/RSV plus assay is intended as an aid in the diagnosis of influenza from Nasopharyngeal swab specimens and should not be used as a sole basis for treatment. Nasal washings and aspirates are unacceptable for Xpert Xpress SARS-CoV-2/FLU/RSV testing.  Fact Sheet for Patients: EntrepreneurPulse.com.au  Fact Sheet for Healthcare Providers: IncredibleEmployment.be  This test is not yet approved or cleared by the [place] FDA and has been authorized for detection and/or diagnosis of SARS-CoV-2 by FDA under an Emergency Use Authorization (EUA). This EUA will remain in effect (meaning this test can be used) for the duration of the COVID-19 declaration under Section 564(b)(1) of the Act, 21 U.S.C. section 360bbb-3(b)(1), unless the authorization is terminated or revoked.     Resp Syncytial Virus by PCR NEGATIVE NEGATIVE Final    Comment: (NOTE) Fact Sheet for Patients: EntrepreneurPulse.com.au  Fact Sheet for Healthcare Providers: IncredibleEmployment.be  This test is not yet approved or cleared by the [place] FDA and has been authorized for detection and/or diagnosis of SARS-CoV-2  by FDA under an Emergency Use Authorization (EUA). This EUA will remain in effect (meaning this test can be used) for the duration of the COVID-19 declaration under Section 564(b)(1) of the Act, 21 U.S.C. section 360bbb-3(b)(1), unless the authorization is terminated or revoked.  Performed at [facility], [address], [place], [place] [address]   Blood culture (routine x 2)     Status: None (Preliminary result)   Collection Time: 10/29/22 11:32 AM   Specimen: BLOOD RIGHT HAND  Result Value Ref Range Status   Specimen Description   Final    BLOOD RIGHT HAND BOTTLES DRAWN AEROBIC AND ANAEROBIC   Special Requests Blood Culture adequate volume  Final   Culture   Final    NO GROWTH < 12 HOURS Performed at [facility], [address] [place], [place] [address]    Report Status PENDING  Incomplete      Radiology Studies: DG Chest Port 1 View  Result Date: 10/29/2022 CLINICAL DATA:  67 year old male with history of shortness of breath. EXAM: PORTABLE CHEST 1 VIEW COMPARISON:  Chest x-ray 06/20/2021. FINDINGS: Lung volumes are normal. Diffuse interstitial prominence and widespread peribronchial cuffing, which appears similar to prior study from 2022. Ill-defined opacity in the periphery of the left lung base which could reflect developing airspace consolidation. No pleural effusions. No pneumothorax. No pulmonary nodule or mass noted. Pulmonary vasculature and the cardiomediastinal silhouette are within normal limits. Atherosclerosis in the thoracic aorta. Status post median sternotomy for CABG. IMPRESSION: 1. The appearance of the lungs suggests chronic bronchitis. There is an ill-defined opacity in the periphery of the left lung base which may suggest developing bronchopneumonia. Followup PA and lateral chest X-ray is recommended in 3-4 weeks following trial  of antibiotic therapy to ensure resolution and exclude underlying malignancy. 2. Aortic atherosclerosis. 3. Electronically Signed   By:  Vinnie Langton M.D.   On: 10/29/2022 09:38   CT Renal Stone Study  Result Date: 10/29/2022 CLINICAL DATA:  Abdominal and flank pain beginning last night. Fever and chills. Dysuria. Previous distal pancreatectomy and splenectomy for pancreatic carcinoma. * Tracking Code: BO * EXAM: CT ABDOMEN AND PELVIS WITHOUT CONTRAST TECHNIQUE: Multidetector CT imaging of the abdomen and pelvis was performed following the standard protocol without IV contrast. RADIATION DOSE REDUCTION: This exam was performed according to the departmental dose-optimization program which includes automated exposure control, adjustment of the mA and/or kV according to patient size and/or use of iterative reconstruction technique. COMPARISON:  06/13/2022 and prior studies dating back to 03/24/2020 FINDINGS: Lower chest: No acute findings. Hepatobiliary: No mass visualized on this unenhanced exam. Small calcified gallstone is again seen, however there is no evidence of cholecystitis or biliary dilatation. Pancreas: Stable postop changes from distal pancreatectomy. Stable cystic lesion in the pancreatic uncinate process measuring 1.4 cm. No new or enlarging pancreatic lesions identified on this unenhanced exam. Spleen:  Prior splenectomy. Adrenals/Urinary tract: No evidence of urolithiasis or hydronephrosis. Diffuse bladder wall thickening is seen as well as left-sided bladder diverticulum, without significant change. This is consistent with chronic cystitis and/or bladder outlet obstruction. Gas is noted in the urinary bladder, likely from recent instrumentation. Stomach/Bowel: No evidence of obstruction, inflammatory process, or abnormal fluid collections. Colonic diverticulosis is noted, without evidence of diverticulitis. Vascular/Lymphatic: No pathologically enlarged lymph nodes identified. No evidence of abdominal aortic aneurysm. Aortic atherosclerotic calcification incidentally noted. Reproductive:  Stable mild-to-moderately enlarged  prostate. Other: Multiple small ventral abdominal wall hernias are again seen which contain only fat, and are unchanged. Musculoskeletal:  No suspicious bone lesions identified. IMPRESSION: No evidence of urolithiasis, hydronephrosis, or other acute findings. Stable findings of chronic cystitis and/or bladder outlet obstruction. Gas in urinary bladder is likely from recent instrumentation; clinical correlation and correlation with urinalysis is recommended. Stable enlarged prostate. No evidence of recurrent or metastatic pancreatic carcinoma. Stable 1.4 cm cystic lesion in pancreatic uncinate process. Recommend continued attention on follow-up imaging. Cholelithiasis. No radiographic evidence of cholecystitis. Colonic diverticulosis, without radiographic evidence of diverticulitis. Multiple small ventral abdominal wall hernias, which contain only fat, without significant change. Electronically Signed   By: Marlaine Hind M.D.   On: 10/29/2022 09:37     Scheduled Meds:  vitamin C  500 mg Oral Daily   aspirin EC  81 mg Oral Q breakfast   atorvastatin  80 mg Oral q1800   cyanocobalamin  1,000 mcg Oral Daily   dextromethorphan-guaiFENesin  1 tablet Oral BID   ezetimibe  10 mg Oral Daily   folic acid  1 mg Oral Daily   gabapentin  300 mg Oral TID   heparin  5,000 Units Subcutaneous Q8H   insulin aspart  0-20 Units Subcutaneous TID WC   insulin aspart  0-5 Units Subcutaneous QHS   insulin aspart  24 Units Subcutaneous Once   insulin glargine-yfgn  50 Units Subcutaneous BID   Ipratropium-Albuterol  1 puff Inhalation BID   levothyroxine  50 mcg Oral Daily   methylPREDNISolone (SOLU-MEDROL) injection  40 mg Intravenous Q12H   metoprolol succinate  25 mg Oral Daily   multivitamin with minerals  1 tablet Oral Daily   sertraline  50 mg Oral Daily   sodium chloride flush  3 mL Intravenous Q12H   sodium chloride flush  3  mL Intravenous Q12H   tamsulosin  0.4 mg Oral QHS   thiamine  100 mg Oral Daily    zinc sulfate  220 mg Oral Daily   Continuous Infusions:  sodium chloride 10 mL/hr at 10/30/22 1707   sodium chloride     azithromycin 500 mg (10/30/22 1052)   cefTRIAXone (ROCEPHIN)  IV 2 g (10/30/22 0849)    LOS: 1 day   Roxan Hockey M.D on 10/30/2022 at 5:24 PM  Go to [email or web address] - for contact info  [facility] - Office  [phone]  If 7PM-7AM, please contact night-coverage [email or web address] 10/30/2022, 5:24 PM

## 2022-10-30 NOTE — Progress Notes (Signed)
Kim, RT placed patient on 1.5L [place] supplemental oxygen because patient c/o shortness of breath. Patient remains on 1.5L [place] and oxygen saturations are 97%. Patient rested well during this shift.

## 2022-10-31 DIAGNOSIS — N3000 Acute cystitis without hematuria: Secondary | ICD-10-CM | POA: Diagnosis not present

## 2022-10-31 LAB — URINE CULTURE: Culture: >=100000 — AB

## 2022-10-31 LAB — GLUCOSE, CAPILLARY
Glucose-Capillary: 287 mg/dL — ABNORMAL HIGH (ref 70–99)
Glucose-Capillary: 385 mg/dL — ABNORMAL HIGH (ref 70–99)
Glucose-Capillary: 376 mg/dL — ABNORMAL HIGH (ref 70–99)

## 2022-10-31 LAB — HEMOGLOBIN A1C
Hgb A1c MFr Bld: 9.9 % — ABNORMAL HIGH (ref 4.8–5.6)
Mean Plasma Glucose: 237 mg/dL

## 2022-10-31 LAB — PHOSPHORUS: Phosphorus: 2.3 mg/dL — ABNORMAL LOW (ref 2.5–4.6)

## 2022-10-31 LAB — COMPREHENSIVE METABOLIC PANEL
ALT: 27 U/L (ref 0–44)
AST: 23 U/L (ref 15–41)
Albumin: 2.8 g/dL — ABNORMAL LOW (ref 3.5–5.0)
Alkaline Phosphatase: 54 U/L (ref 38–126)
Anion gap: 5 (ref 5–15)
BUN: 30 mg/dL — ABNORMAL HIGH (ref 8–23)
CO2: 20 mmol/L — ABNORMAL LOW (ref 22–32)
Calcium: 7.7 mg/dL — ABNORMAL LOW (ref 8.9–10.3)
Chloride: 109 mmol/L (ref 98–111)
Creatinine, Ser: 0.69 mg/dL (ref 0.61–1.24)
GFR, Estimated: >60 mL/min (ref >60)
Glucose, Bld: 279 mg/dL — ABNORMAL HIGH (ref 70–99)
Potassium: 4.2 mmol/L (ref 3.5–5.1)
Sodium: 134 mmol/L — ABNORMAL LOW (ref 135–145)
Total Bilirubin: 0.8 mg/dL (ref 0.3–1.2)
Total Protein: 6.1 g/dL — ABNORMAL LOW (ref 6.5–8.1)

## 2022-10-31 LAB — FERRITIN: Ferritin: 325 ng/mL (ref 24–336)

## 2022-10-31 LAB — C-REACTIVE PROTEIN: CRP: 9.9 mg/dL — ABNORMAL HIGH (ref <1.0)

## 2022-10-31 LAB — MAGNESIUM: Magnesium: 2.3 mg/dL (ref 1.7–2.4)

## 2022-10-31 MED ORDER — ZINC SULFATE 220 (50 ZN) MG PO CAPS: 220.0000 mg | ORAL_CAPSULE | Freq: Every day | ORAL | 3 refills | Status: AC

## 2022-10-31 MED ORDER — ADULT MULTIVITAMIN W/MINERALS CH: 1.0000 | ORAL_TABLET | Freq: Every day | ORAL | 2 refills | Status: AC

## 2022-10-31 MED ORDER — PREDNISONE 20 MG PO TABS: 40.0000 mg | ORAL_TABLET | Freq: Every day | ORAL | 0 refills | Status: AC

## 2022-10-31 MED ORDER — DM-GUAIFENESIN ER 30-600 MG PO TB12: 1.0000 | ORAL_TABLET | Freq: Two times a day (BID) | ORAL | 0 refills | Status: AC

## 2022-10-31 MED ORDER — ASPIRIN 81 MG PO TBEC: 81.0000 mg | DELAYED_RELEASE_TABLET | Freq: Every day | ORAL | 3 refills | Status: AC

## 2022-10-31 MED ORDER — VITAMIN B-12 1000 MCG PO TABS: 1000.0000 ug | ORAL_TABLET | Freq: Every day | ORAL | 3 refills | Status: AC

## 2022-10-31 MED ORDER — SODIUM PHOSPHATES 45 MMOLE/15ML IV SOLN
15.0000 mmol | Freq: Once | INTRAVENOUS | Status: AC
  Administered 2022-10-31: 15 mmol via INTRAVENOUS
  Filled 2022-10-31: qty 5

## 2022-10-31 MED ORDER — INSULIN GLARGINE-YFGN 100 UNIT/ML ~~LOC~~ SOLN
15.0000 [IU] | Freq: Once | SUBCUTANEOUS | Status: AC
  Administered 2022-10-31: 15 [IU] via SUBCUTANEOUS
  Filled 2022-10-31: qty 0.15

## 2022-10-31 MED ORDER — TAMSULOSIN HCL 0.4 MG PO CAPS: 0.4000 mg | ORAL_CAPSULE | Freq: Every day | ORAL | 3 refills | Status: AC

## 2022-10-31 MED ORDER — CEPHALEXIN 500 MG PO CAPS: 500.0000 mg | ORAL_CAPSULE | Freq: Three times a day (TID) | ORAL | 0 refills | Status: AC

## 2022-10-31 MED ORDER — AZITHROMYCIN 500 MG PO TABS: 500.0000 mg | ORAL_TABLET | Freq: Every day | ORAL | 0 refills | Status: AC

## 2022-10-31 MED ORDER — INSULIN GLARGINE-YFGN 100 UNIT/ML ~~LOC~~ SOLN
60.0000 [IU] | Freq: Two times a day (BID) | SUBCUTANEOUS | Status: DC
  Administered 2022-10-31: 60 [IU] via SUBCUTANEOUS
  Filled 2022-10-31 (×3): qty 0.6

## 2022-10-31 NOTE — Inpatient Diabetes Management (Addendum)
Inpatient Diabetes Program Recommendations  AACE/ADA: New Consensus Statement on Inpatient Glycemic Control   Target Ranges:  Prepandial:   less than 140 mg/dL      Peak postprandial:   less than 180 mg/dL (1-2 hours)      Critically ill patients:  140 - 180 mg/dL    Latest Reference Range & Units 10/30/22 08:05 10/30/22 11:51 10/30/22 17:09 10/30/22 20:56  Glucose-Capillary 70 - 99 mg/dL 324 (H) 357 (H) 400 (H) 338 (H)    Latest Reference Range & Units 10/29/22 08:35  Hemoglobin A1C 4.8 - 5.6 % 9.9 (H)   Review of Glycemic Control  Diabetes history: DM2 Outpatient Diabetes medications: Toujeo 60-68 units QHS, Novolog 18-24 units TID with meals Current orders for Inpatient glycemic control: Semglee 50 units BID, Novolog 0-20 units TID with meals, Novolog 0-5 units QHS; Solumedrol 40 mg Q12H  Inpatient Diabetes Program Recommendations:    Insulin: Noted Semglee increased from 35 units BID to 50 units BID. If post prandial glucose remains consistently elevated, please consider ordering Novolog meal coverage insulin.  HbgA1C: A1C 9.9% on 10/29/22 indicating an average glucose of 237 mg/dl over the past 2-3 months.  Outpatient DM: Patient states he needs refills for Toujeo and Novolog at discharge. At discharge please provide Rx for [employer] [phone]) and Novolog Flexpens [phone]).  NOTE: Spoke with patient over the phone about diabetes and home regimen for diabetes control.  Patient reports being followed by PCP for diabetes management and currently taking Toujeo 60-68 units QHS and Novolog 18-24 units TID with meals (as long as glucose is over 80 mg/dl) as an outpatient for diabetes control. Patient reports taking DM medications as prescribed and notes that he has been adjusting Toujeo dose between the 60-68 units daily depending on glucose trends. Patient reports that he has had some lows over the past week since he has been sick (low as 46 mg/dl) and notes lows are usually  upon awakening in the morning.  Patient states that CBGs have been a "roller coaster" since being sick.  Patient reports that he is seeing PCP every 3 months and that his last A1C was in the 10% range.   Discussed A1C results (9.9% on 10/29/22) and explained that current A1C indicates an average glucose of 237 mg/dl over the past 2-3 months. Discussed glucose and A1C goals. Discussed importance of checking CBGs and maintaining good CBG control to prevent long-term and short-term complications.  Stressed to the patient the importance of improving glycemic control to prevent further complications from uncontrolled diabetes. Discussed impact of steroids on glucose. Discussed current insulin orders and that it is anticipated glucose will be elevated with steroids. Asked patient to pay attention to discharge instructions in case his insulin regimen is adjusted at discharge. Patient states he needs refills for Toujeo and Novolog at discharge since he only has one pen left of each.  Patient reports he has an appointment with PCP already scheduled for April 2024. Encouraged patient to reach out to PCP if CBGs continue to be consistently over 180 mg/dl at home to see if PCP can assist with making adjustments to insulin to improve overall DM control.  Patient verbalized understanding of information discussed and reports no further questions at this time related to diabetes.  Thanks, Barnie Alderman, RN, MSN, [place] Diabetes Coordinator Inpatient Diabetes Program [phone] (Team Pager from 8am to [place])

## 2022-10-31 NOTE — Discharge Summary (Signed)
Mark Foster, is a 67 y.o. male  DOB 10/17/1955  MRN [id number].  Admission date:  10/29/2022  Admitting Physician  Roxan Hockey, MD  Discharge Date:  10/31/2022   Primary MD  Celene Squibb, MD  Recommendations for primary care physician for things to follow:   1)Increase Toujeo to 60 units daily starting Today Tuesday 10/31/22 thru Saturday 11/04/22 inclusive, then go back to 50 units daily starting on Sunday 11/05/22 2)You are strongly advised to isolate/quarantine for at least 5 days from the date of your diagnosis with COVID-19 infection--please always wear a mask if you have to go outside the house  3)Please take medications as prescribed--- please note that there were some changes to your medications  Admission Diagnosis  Acute cystitis without hematuria [N30.00] Sepsis due to urinary tract infection ([place]) [A41.9, N39.0] Community acquired pneumonia, unspecified laterality [J18.9]   Discharge Diagnosis  Acute cystitis without hematuria [N30.00] Sepsis due to urinary tract infection ([place]) [A41.9, N39.0] Community acquired pneumonia, unspecified laterality [J18.9]    Principal Problem:   Sepsis due to urinary tract infection ([place]) Active Problems:   Pancreatic adenocarcinoma ([place])   COVID-19 virus infection   Uncontrolled type 2 diabetes mellitus with hyperglycemia ([place])   H/O ETOH abuse   Essential hypertension, benign   Hx of completed stroke   Hypothyroidism      Past Medical History:  Diagnosis Date   Bilateral carpal tunnel syndrome 10/19/2020   CAD (coronary artery disease)    a. 01/2020: CABG x4 with LIMA-LAD, SVG-OM, SVG-PDA and SVG-D1   Carotid artery obstruction, left    Left ICA occlusion   Cervical compression fracture (HCC)    Cervical spinal stenosis    Diabetic neuropathy (HCC)    Bilateral legs   Diverticulitis    Family history of pancreatic cancer    History of  kidney stones    Hypothyroidism    Pancreatic cancer ([place])    Stenosis of left vertebral artery    Stroke (cerebrum) ([place]) 10/09/2019   Type 2 diabetes mellitus ([place])     Past Surgical History:  Procedure Laterality Date   APPENDECTOMY     BUBBLE STUDY N/A 11/18/2019   Procedure: BUBBLE STUDY;  Surgeon: Herminio Commons, MD;  Location: [facility];  Service: Cardiology;  Laterality: N/A;   CARDIAC SURGERY     COLON RESECTION     For diverticulitis   COLON SURGERY     CORONARY ARTERY BYPASS GRAFT N/A 01/26/2020   Procedure: CORONARY ARTERY BYPASS GRAFTING (CABG) TIMES FOUR USING LEFT INTERNAL MAMMARY VEIN AND RIGHT GREATER SAPHENOUS VEIN;  Surgeon: Melrose Nakayama, MD;  Location: [facility];  Service: Open Heart Surgery;  Laterality: N/A;   ENDOVEIN HARVEST OF GREATER SAPHENOUS VEIN Right 01/26/2020   Procedure: [place] Of Greater Saphenous Vein;  Surgeon: Melrose Nakayama, MD;  Location: [facility];  Service: Open Heart Surgery;  Laterality: Right;   ESOPHAGOGASTRODUODENOSCOPY N/A 10/03/2018   Procedure: ESOPHAGOGASTRODUODENOSCOPY (EGD);  Surgeon: [name], MD;  [facility];  Service: Endoscopy;  Laterality: N/A;   EUS N/A 10/03/2018   Procedure: UPPER ENDOSCOPIC ULTRASOUND (EUS) RADIAL;  Surgeon: [name], MD;  [facility];  Service: Endoscopy;  Laterality: N/A;   EYE SURGERY Bilateral    FINE NEEDLE ASPIRATION N/A 10/03/2018   Procedure: FINE NEEDLE ASPIRATION (FNA) LINEAR;  Surgeon: [name], MD;  [facility];  Service: Endoscopy;  Laterality: N/A;   IR ANGIO INTRA EXTRACRAN SEL COM CAROTID INNOMINATE BILAT MOD SED  01/21/2018   IR ANGIO INTRA EXTRACRAN SEL COM CAROTID INNOMINATE UNI R MOD SED  03/21/2018   IR ANGIO VERTEBRAL SEL VERTEBRAL BILAT MOD SED  01/21/2018   IR TRANSCATH EXCRAN VERT OR CAR A STENT  03/21/2018   LEFT HEART CATH AND CORONARY ANGIOGRAPHY N/A 01/08/2020   Procedure: LEFT HEART CATH AND CORONARY  ANGIOGRAPHY;  Surgeon: Leonie Man, MD;  Location: [facility];  Service: Cardiovascular;  Laterality: N/A;   PORT-A-CATH REMOVAL N/A 06/20/2021   Procedure: MINOR REMOVAL PORT-A-CATH;  Surgeon: Virl Cagey, MD;  [facility];  Service: General;  Laterality: N/A;   PORTACATH PLACEMENT Left 12/23/2018   Procedure: INSERTION PORT-A-CATH;  Surgeon: Virl Cagey, MD;  Location: [facility];  Service: General;  Laterality: Left;   RADIOLOGY WITH ANESTHESIA N/A 03/21/2018   Procedure: IR WITH ANESTHESIA WITH STENT PLACEMENT;  Surgeon: Luanne Bras, MD;  Location: [facility];  Service: Radiology;  Laterality: N/A;   ROTATOR CUFF REPAIR     Left   SPLENECTOMY     TEE WITHOUT CARDIOVERSION N/A 11/18/2019   Procedure: TRANSESOPHAGEAL ECHOCARDIOGRAM (TEE) WITH PROPOFOL;  Surgeon: Herminio Commons, MD;  [facility];  Service: Cardiology;  Laterality: N/A;   TEE WITHOUT CARDIOVERSION N/A 01/26/2020   Procedure: TRANSESOPHAGEAL ECHOCARDIOGRAM (TEE);  Surgeon: Melrose Nakayama, MD;  Location: [facility];  Service: Open Heart Surgery;  Laterality: N/A;   TONSILLECTOMY         HPI  from the history and physical done on the day of admission:    Mark Foster  is a 67 y.o. male with past medical history relevant for CAD with prior CABG x 4 (01/2020: CABG x4 with LIMA-LAD, SVG-OM, SVG-PDA and SVG-D1), PAD with history of left ICA occlusion, DM2, hypothyroidism,, history of prior stroke, history of Stage IIb (T2N1)  pancreatic cancer (s/p  Distal pancreatectomy and splenectomy at [facility] on 11/20/2018 followed by Adjuvant FOLFIRINOX x 12 cycles from 12/30/2018 to 06/04/2019, followed by palonosetron (ALOXI))  , history of prior left upper extremity DVT resolved after Port-A-Cath removal patient to the ED with fevers up to 105 associated with chills, as well as dysuria and urinary frequency and some degree of urinary incontinence/dribbling for the last couple days -Reports cough,  generalized weakness, myalgias for at least a week now--patient states respiratory symptoms have persisted for over a week -No chest pains and palpitations no dizziness -He is not vaccinated against COVID -Chest x-ray with bronchitis and possible bronchial pneumonia -Patient tested positive for COVID, negative for flu and negative for RSV -UA consistent with UTI - CT renal protocol with finding of chronic cystitis and possible bladder outlet obstruction -T. bili is 1.9, otherwise LFTs are normal -WBC 2.1, hemoglobin 12.5, platelets 81 -Sodium is 131, glucose 245 bicarb 20, anion gap 8, continue 0.92 -Lactic acidosis 1.3     Hospital Course:   Brief Narrative:  67 y.o. male with past medical history relevant for CAD with prior CABG  x 4 (01/2020: CABG x4 with LIMA-LAD, SVG-OM, SVG-PDA and SVG-D1), PAD with history of left ICA occlusion, DM2, hypothyroidism,, history of prior stroke, history of Stage IIb (T2N1)  pancreatic cancer (s/p  Distal pancreatectomy and splenectomy at [facility] on 11/20/2018 followed by Adjuvant FOLFIRINOX x 12 cycles from 12/30/2018 to 06/04/2019, followed by palonosetron (ALOXI))  , history of prior left upper extremity DVT resolved after Port-A-Cath admitted on 10/29/2022 with abscess secondary to E. coli UTI and pneumonia, as well as concomitant COVID-19 infection   -Assessment and Plan: 1)Sepsis due to E. coli UTI and CAP-----Bacterial Pneumonia Versus COVID-related Pneumonia --Chest x-ray with Bronchitis and possible bronchial Pneumonia -Patient tested positive for COVID, negative for flu and negative for RSV --He is Not vaccinated against COVID -Treated with Rocephin/azithromycin , okay to discharge on azithromycin and Keflex -Steroids for presumed COVID-related pneumonia -Bronchodilators and mucolytics as ordered -Zinc and vitamin C as ordered -Respiratory status improved significantly -No hypoxia -Ambulating without significant dyspnea on exertion   2)Sepsis  due to E. Coli UTI--- patient with urinary symptoms as below in HPI -- CT renal protocol with finding of chronic cystitis and possible bladder outlet obstruction -Treated with IV Rocephin and discharged on p.o. Keflex   3)H/o CAD/PAD---CAD with prior CABG x 4 (01/2020: CABG x4 with LIMA-LAD, SVG-OM, SVG-PDA and SVG-D1), PAD with history of left ICA occlusion,  --No chest pains and palpitations no dizziness -EKG sinus rhythm without acute findings -Continue aspirin, Lipitor and metoprolol   4)Pancytopenia--- in the setting of underlying malignancy and prior chemotherapy --No Bleeding concerns...  Indices are stable   5)Hyponatremia--- sodium is up 135 -it was 131 on admission-- suspect dehydration related hydrate gently   6)History of Stage IIb (T2N1)  pancreatic cancer (s/p  Distal pancreatectomy and splenectomy at [facility] on 11/20/2018 followed by Adjuvant FOLFIRINOX x 12 cycles from 12/30/2018 to 06/04/2019, followed by palonosetron (ALOXI))  -CT renal stone protocol today without evidence of recurrence -Follow-up with Dr. Delton Coombes as outpatient as previously advised   7)DM2-last A1c greater than 9 previously uncontrolled with hyperglycemia -Anticipate worsening glycemic control with steroids as above #1 -Increase Toujeo to 60 units daily starting today Tuesday 10/31/22 thru Saturday 11/04/22 inclusive, (while on high-dose steroids ), then go back to 50 units daily starting on Sunday 11/05/22     8)H/o  prior stroke--stable, no new deficits, continue aspirin and Lipitor for secondary prophylaxis    Disposition: The patient is from: Home              Anticipated d/c is to: Home   Discharge Condition: Stable without hypoxia  Follow UP   Follow-up Information     Celene Squibb, MD Follow up in 2 week(s).   Specialty: Internal Medicine Contact information: [place] [address] [phone]                 Diet and Activity recommendation:  As  advised  Discharge Instructions    Discharge Instructions     Call MD for:  difficulty breathing, headache or visual disturbances   Complete by: As directed    Call MD for:  persistant dizziness or light-headedness   Complete by: As directed    Call MD for:  persistant nausea and vomiting   Complete by: As directed    Call MD for:  temperature >100.4   Complete by: As directed    Diet - low sodium heart healthy   Complete by: As directed  Diet Carb Modified   Complete by: As directed    Discharge instructions   Complete by: As directed    1)Increase Toujeo to 60 units daily starting Today Tuesday 10/31/22 thru Saturday 11/04/22 inclusive, then go back to 50 units daily starting on Sunday 11/05/22 2)You are strongly advised to isolate/quarantine for at least 5 days from the date of your diagnosis with COVID-19 infection--please always wear a mask if you have to go outside the house  3)Please take medications as prescribed--- please note that there were some changes to your medications   Increase activity slowly   Complete by: As directed        Discharge Medications     Allergies as of 10/31/2022       Reactions   Demerol [meperidine Hcl] Other (See Comments)   convulsions        Medication List     STOP taking these medications    Gvoke HypoPen 2-Pack 1 MG/0.2ML Soaj Generic drug: Glucagon   metoprolol tartrate 25 MG tablet Commonly known as: LOPRESSOR   traMADol 50 MG tablet Commonly known as: ULTRAM       TAKE these medications    aspirin EC 81 MG tablet Take 1 tablet (81 mg total) by mouth daily with breakfast. What changed: when to take this   atorvastatin 80 MG tablet Commonly known as: LIPITOR Take 1 tablet (80 mg total) by mouth daily at 6 PM. What changed: when to take this   azithromycin 500 MG tablet Commonly known as: ZITHROMAX Take 1 tablet (500 mg total) by mouth daily for 3 days.   cephALEXin 500 MG capsule Commonly known as:  Keflex Take 1 capsule (500 mg total) by mouth 3 (three) times daily for 5 days.   CINNAMON PO Take by mouth.   cyanocobalamin 1000 MCG tablet Commonly known as: VITAMIN B12 Take 1 tablet (1,000 mcg total) by mouth daily.   dextromethorphan-guaiFENesin 30-600 MG 12hr tablet Commonly known as: MUCINEX DM Take 1 tablet by mouth 2 (two) times daily for 5 days.   ECHINACEA COMPLEX PO Take 1,000 mg by mouth daily.   ezetimibe 10 MG tablet Commonly known as: ZETIA Take by mouth.   FreeStyle Libre 2 Reader Kerrin Mo As directed   [employer] 2 Sensor Misc USE AS DIRECTED TO CHECK BLOOD SUGAR, REPLACE EVERY 14 DAYS   FreeStyle Precision Neo Test test strip Generic drug: glucose blood USE TO TEST BLOOD SUGAR 4 TIMES DAILY.   gabapentin 300 MG capsule Commonly known as: NEURONTIN Take 1 capsule (300 mg total) by mouth 3 (three) times daily. What changed: Another medication with the same name was removed. Continue taking this medication, and follow the directions you see here.   Insulin Pen Needle 32G X 4 MM Misc 1 Device by Does not apply route daily. Use as directed to inject insulin four times daily   levothyroxine 50 MCG tablet Commonly known as: SYNTHROID TAKE 1 TABLET EVERY DAY   metoprolol succinate 25 MG 24 hr tablet Commonly known as: TOPROL-XL Take 25 mg by mouth daily.   multivitamin with minerals Tabs tablet Take 1 tablet by mouth daily. Start taking on: November 01, 2022   nitroGLYCERIN 0.4 MG SL tablet Commonly known as: NITROSTAT Place 1 tablet (0.4 mg total) under the tongue every 5 (five) minutes as needed for chest pain.   NovoLOG FlexPen 100 UNIT/ML FlexPen Generic drug: insulin aspart Inject 14-20 Units into the skin 3 (three) times daily before meals. What  changed: how much to take   Omega-3 1000 MG Caps Take 1,000 mg by mouth daily.   predniSONE 20 MG tablet Commonly known as: DELTASONE Take 2 tablets (40 mg total) by mouth daily with  breakfast for 5 days.   sertraline 50 MG tablet Commonly known as: ZOLOFT TAKE 1 TABLET BY MOUTH DAILY   tamsulosin 0.4 MG Caps capsule Commonly known as: FLOMAX Take 1 capsule (0.4 mg total) by mouth daily after supper. What changed: when to take this   Toujeo Max SoloStar 300 UNIT/ML Solostar Pen Generic drug: insulin glargine (2 Unit Dial) INJECT 60 UNITS INTO THE SKIN AT BEDTIME.   Vitamin D3 25 MCG (1000 UT) capsule Generic drug: Cholecalciferol Take 1,000 Units by mouth daily.   zinc sulfate 220 (50 Zn) MG capsule Take 1 capsule (220 mg total) by mouth daily. Start taking on: November 01, 2022       Major procedures and Radiology Reports - PLEASE review detailed and final reports for all details, in brief -  DG Chest Port 1 View  Result Date: 10/29/2022 CLINICAL DATA:  67 year old male with history of shortness of breath. EXAM: PORTABLE CHEST 1 VIEW COMPARISON:  Chest x-ray 06/20/2021. FINDINGS: Lung volumes are normal. Diffuse interstitial prominence and widespread peribronchial cuffing, which appears similar to prior study from 2022. Ill-defined opacity in the periphery of the left lung base which could reflect developing airspace consolidation. No pleural effusions. No pneumothorax. No pulmonary nodule or mass noted. Pulmonary vasculature and the cardiomediastinal silhouette are within normal limits. Atherosclerosis in the thoracic aorta. Status post median sternotomy for CABG. IMPRESSION: 1. The appearance of the lungs suggests chronic bronchitis. There is an ill-defined opacity in the periphery of the left lung base which may suggest developing bronchopneumonia. Followup PA and lateral chest X-ray is recommended in 3-4 weeks following trial of antibiotic therapy to ensure resolution and exclude underlying malignancy. 2. Aortic atherosclerosis. 3. Electronically Signed   By: Vinnie Langton M.D.   On: 10/29/2022 09:38   CT Renal Stone Study  Result Date:  10/29/2022 CLINICAL DATA:  Abdominal and flank pain beginning last night. Fever and chills. Dysuria. Previous distal pancreatectomy and splenectomy for pancreatic carcinoma. * Tracking Code: BO * EXAM: CT ABDOMEN AND PELVIS WITHOUT CONTRAST TECHNIQUE: Multidetector CT imaging of the abdomen and pelvis was performed following the standard protocol without IV contrast. RADIATION DOSE REDUCTION: This exam was performed according to the departmental dose-optimization program which includes automated exposure control, adjustment of the mA and/or kV according to patient size and/or use of iterative reconstruction technique. COMPARISON:  06/13/2022 and prior studies dating back to 03/24/2020 FINDINGS: Lower chest: No acute findings. Hepatobiliary: No mass visualized on this unenhanced exam. Small calcified gallstone is again seen, however there is no evidence of cholecystitis or biliary dilatation. Pancreas: Stable postop changes from distal pancreatectomy. Stable cystic lesion in the pancreatic uncinate process measuring 1.4 cm. No new or enlarging pancreatic lesions identified on this unenhanced exam. Spleen:  Prior splenectomy. Adrenals/Urinary tract: No evidence of urolithiasis or hydronephrosis. Diffuse bladder wall thickening is seen as well as left-sided bladder diverticulum, without significant change. This is consistent with chronic cystitis and/or bladder outlet obstruction. Gas is noted in the urinary bladder, likely from recent instrumentation. Stomach/Bowel: No evidence of obstruction, inflammatory process, or abnormal fluid collections. Colonic diverticulosis is noted, without evidence of diverticulitis. Vascular/Lymphatic: No pathologically enlarged lymph nodes identified. No evidence of abdominal aortic aneurysm. Aortic atherosclerotic calcification incidentally noted. Reproductive:  Stable mild-to-moderately enlarged  prostate. Other: Multiple small ventral abdominal wall hernias are again seen which  contain only fat, and are unchanged. Musculoskeletal:  No suspicious bone lesions identified. IMPRESSION: No evidence of urolithiasis, hydronephrosis, or other acute findings. Stable findings of chronic cystitis and/or bladder outlet obstruction. Gas in urinary bladder is likely from recent instrumentation; clinical correlation and correlation with urinalysis is recommended. Stable enlarged prostate. No evidence of recurrent or metastatic pancreatic carcinoma. Stable 1.4 cm cystic lesion in pancreatic uncinate process. Recommend continued attention on follow-up imaging. Cholelithiasis. No radiographic evidence of cholecystitis. Colonic diverticulosis, without radiographic evidence of diverticulitis. Multiple small ventral abdominal wall hernias, which contain only fat, without significant change. Electronically Signed   By: Marlaine Hind M.D.   On: 10/29/2022 09:37    Micro Results   Recent Results (from the past 240 hour(s))  Blood culture (routine x 2)     Status: None (Preliminary result)   Collection Time: 10/29/22  9:50 AM   Specimen: Right Antecubital; Blood  Result Value Ref Range Status   Specimen Description   Final    RIGHT ANTECUBITAL BOTTLES DRAWN AEROBIC AND ANAEROBIC   Special Requests   Final    Blood Culture results may not be optimal due to an excessive volume of blood received in culture bottles   Culture   Final    NO GROWTH 2 DAYS Performed at [facility], [address] [place], [place] [address]    Report Status PENDING  Incomplete  Urine Culture     Status: Abnormal   Collection Time: 10/29/22  9:52 AM   Specimen: Urine, Catheterized  Result Value Ref Range Status   Specimen Description   Final    URINE, CATHETERIZED Performed at [facility], [address] [place], [place] [address]    Special Requests   Final    NONE Performed at [facility], [address], [place], [place] [address]    Culture >=100,000 COLONIES/mL ESCHERICHIA COLI (A)  Final   Report Status  10/31/2022 FINAL  Final   Organism ID, Bacteria ESCHERICHIA COLI (A)  Final      Susceptibility   Escherichia coli - MIC*    AMPICILLIN <=2 SENSITIVE Sensitive     CEFAZOLIN <=4 SENSITIVE Sensitive     CEFEPIME <=0.12 SENSITIVE Sensitive     CEFTRIAXONE <=0.25 SENSITIVE Sensitive     CIPROFLOXACIN <=0.25 SENSITIVE Sensitive     GENTAMICIN <=1 SENSITIVE Sensitive     IMIPENEM <=0.25 SENSITIVE Sensitive     NITROFURANTOIN <=16 SENSITIVE Sensitive     TRIMETH/SULFA <=20 SENSITIVE Sensitive     AMPICILLIN/SULBACTAM <=2 SENSITIVE Sensitive     PIP/TAZO <=4 SENSITIVE Sensitive     * >=100,000 COLONIES/mL ESCHERICHIA COLI  Resp panel by RT-PCR (RSV, Flu A&B, Covid) Anterior Nasal Swab     Status: Abnormal   Collection Time: 10/29/22  9:59 AM   Specimen: Anterior Nasal Swab  Result Value Ref Range Status   SARS Coronavirus 2 by RT PCR POSITIVE (A) NEGATIVE Final    Comment: (NOTE) SARS-CoV-2 target nucleic acids are DETECTED.  The SARS-CoV-2 RNA is generally detectable in upper respiratory specimens during the acute phase of infection. Positive results are indicative of the presence of the identified virus, but do not rule out bacterial infection or co-infection with other pathogens not detected by the test. Clinical correlation with patient history and other diagnostic information is necessary to determine patient infection status. The expected result is Negative.  Fact Sheet for Patients: EntrepreneurPulse.com.au  Fact  Sheet for Healthcare Providers: IncredibleEmployment.be  This test is not yet approved or cleared by the [place] and  has been authorized for detection and/or diagnosis of SARS-CoV-2 by FDA under an Emergency Use Authorization (EUA).  This EUA will remain in effect (meaning this test can be used) for the duration of  the COVID-19 declaration under Section 564(b)(1) of the A ct, 21 U.S.C. section 360bbb-3(b)(1), unless  the authorization is terminated or revoked sooner.     Influenza A by PCR NEGATIVE NEGATIVE Final   Influenza B by PCR NEGATIVE NEGATIVE Final    Comment: (NOTE) The Xpert Xpress SARS-CoV-2/FLU/RSV plus assay is intended as an aid in the diagnosis of influenza from Nasopharyngeal swab specimens and should not be used as a sole basis for treatment. Nasal washings and aspirates are unacceptable for Xpert Xpress SARS-CoV-2/FLU/RSV testing.  Fact Sheet for Patients: EntrepreneurPulse.com.au  Fact Sheet for Healthcare Providers: IncredibleEmployment.be  This test is not yet approved or cleared by the [place] FDA and has been authorized for detection and/or diagnosis of SARS-CoV-2 by FDA under an Emergency Use Authorization (EUA). This EUA will remain in effect (meaning this test can be used) for the duration of the COVID-19 declaration under Section 564(b)(1) of the Act, 21 U.S.C. section 360bbb-3(b)(1), unless the authorization is terminated or revoked.     Resp Syncytial Virus by PCR NEGATIVE NEGATIVE Final    Comment: (NOTE) Fact Sheet for Patients: EntrepreneurPulse.com.au  Fact Sheet for Healthcare Providers: IncredibleEmployment.be  This test is not yet approved or cleared by the [place] FDA and has been authorized for detection and/or diagnosis of SARS-CoV-2 by FDA under an Emergency Use Authorization (EUA). This EUA will remain in effect (meaning this test can be used) for the duration of the COVID-19 declaration under Section 564(b)(1) of the Act, 21 U.S.C. section 360bbb-3(b)(1), unless the authorization is terminated or revoked.  Performed at [facility], [address] [place], [place] [address]   Blood culture (routine x 2)     Status: None (Preliminary result)   Collection Time: 10/29/22 11:32 AM   Specimen: BLOOD RIGHT HAND  Result Value Ref Range Status   Specimen  Description   Final    BLOOD RIGHT HAND BOTTLES DRAWN AEROBIC AND ANAEROBIC   Special Requests Blood Culture adequate volume  Final   Culture   Final    NO GROWTH 2 DAYS Performed at [facility], [address] [place], [place] [address]    Report Status PENDING  Incomplete   Today   Subjective    Mark Foster today has no new complaints -Ambulating without significant dyspnea on exertion -No hypoxia        Patient has been seen and examined prior to discharge   Objective   Blood pressure 126/75, pulse 77, temperature (!) 97.5 F (36.4 C), temperature source Oral, resp. rate 20, height '5\' 10"'$  (1.778 m), weight 99.8 kg, SpO2 94 %.   Intake/Output Summary (Last 24 hours) at 10/31/2022 1517 Last data filed at 10/31/2022 1500 Gross per 24 hour  Intake 1800.34 ml  Output --  Net 1800.34 ml    Exam Gen:- Awake Alert, no acute distress , speaking in complete sentences HEENT:- [place].AT, No sclera icterus Neck-Supple Neck,No JVD,.  Lungs-improved air  movement, no wheezing CV- S1, S2 normal, regular Abd-  +ve B.Sounds, Abd Soft, No tenderness,    Extremity/Skin:- No  edema,   good pulses Psych-affect is appropriate, oriented x3 Neuro-no new focal deficits, no tremors  Data Review   CBC w Diff:  Lab Results  Component Value Date   WBC 1.7 (L) 10/30/2022   HGB 12.6 (L) 10/30/2022   HGB 14.9 12/11/2018   HCT 36.7 (L) 10/30/2022   PLT 72 (L) 10/30/2022   PLT 335 12/11/2018   LYMPHOPCT 48 10/29/2022   BANDSPCT 24 10/29/2022   MONOPCT 0 10/29/2022   EOSPCT 0 10/29/2022   BASOPCT 0 10/29/2022    CMP:  Lab Results  Component Value Date   NA 134 (L) 10/31/2022   K 4.2 10/31/2022   CL 109 10/31/2022   CO2 20 (L) 10/31/2022   BUN 30 (H) 10/31/2022   CREATININE 0.69 10/31/2022   CREATININE 0.86 05/17/2020   PROT 6.1 (L) 10/31/2022   ALBUMIN 2.8 (L) 10/31/2022   BILITOT 0.8 10/31/2022   BILITOT 0.8 12/11/2018   ALKPHOS 54 10/31/2022   AST 23 10/31/2022   AST 16  12/11/2018   ALT 27 10/31/2022   ALT 19 12/11/2018  .  Total Discharge time is about 33 minutes  Roxan Hockey M.D on 10/31/2022 at 3:17 PM  Go to [email or web address] -  for contact info  [facility] - Office  [phone]

## 2022-10-31 NOTE — Progress Notes (Signed)
Patient rested well during this shift. He is on room air and oxygen saturations are 93-94%. Patient has no complaints during this shift.

## 2022-10-31 NOTE — Discharge Instructions (Signed)
1)Increase Toujeo to 60 units daily starting Today Tuesday 10/31/22 thru Saturday 11/04/22 inclusive, then go back to 50 units daily starting on Sunday 11/05/22 2)You are strongly advised to isolate/quarantine for at least 5 days from the date of your diagnosis with COVID-19 infection--please always wear a mask if you have to go outside the house  3)Please take medications as prescribed--- please note that there were some changes to your medications

## 2022-10-31 NOTE — Care Management Important Message (Signed)
Important Message  Patient Details  Name: Mark Foster MRN: [id number] Date of Birth: 02-03-1956   Medicare Important Message Given:  N/A - LOS <3 / Initial given by admissions     Tommy Medal 10/31/2022, 3:37 PM

## 2022-11-01 DIAGNOSIS — J029 Acute pharyngitis, unspecified: Secondary | ICD-10-CM | POA: Diagnosis not present

## 2022-11-03 LAB — CULTURE, BLOOD (ROUTINE X 2)
Culture: NO GROWTH
Culture: NO GROWTH
Special Requests: ADEQUATE

## 2022-11-09 DIAGNOSIS — Z7984 Long term (current) use of oral hypoglycemic drugs: Secondary | ICD-10-CM | POA: Diagnosis not present

## 2022-11-09 DIAGNOSIS — Z8616 Personal history of COVID-19: Secondary | ICD-10-CM | POA: Diagnosis not present

## 2022-11-09 DIAGNOSIS — M545 Low back pain, unspecified: Secondary | ICD-10-CM | POA: Diagnosis not present

## 2022-11-09 DIAGNOSIS — E1165 Type 2 diabetes mellitus with hyperglycemia: Secondary | ICD-10-CM | POA: Diagnosis not present

## 2022-11-09 DIAGNOSIS — Z8701 Personal history of pneumonia (recurrent): Secondary | ICD-10-CM | POA: Diagnosis not present

## 2022-11-09 DIAGNOSIS — Z87440 Personal history of urinary (tract) infections: Secondary | ICD-10-CM | POA: Diagnosis not present

## 2022-11-09 DIAGNOSIS — G8929 Other chronic pain: Secondary | ICD-10-CM | POA: Diagnosis not present

## 2022-11-09 DIAGNOSIS — R059 Cough, unspecified: Secondary | ICD-10-CM | POA: Diagnosis not present

## 2022-11-14 DIAGNOSIS — N390 Urinary tract infection, site not specified: Secondary | ICD-10-CM | POA: Diagnosis not present

## 2022-11-15 ENCOUNTER — Telehealth: Payer: Self-pay

## 2022-11-15 NOTE — Telephone Encounter (Signed)
Called pt to schedule Sleep Consultation and he asked me to let Janett Billow know he still has not received his gabapentin. Let pt know I would send a note to [facility] for someone to look into this.

## 2022-11-15 NOTE — Telephone Encounter (Signed)
Rx was sent with 3 refills  E-Prescribing Status: Receipt confirmed by pharmacy (10/12/2022  3:03 PM EST)

## 2022-11-20 ENCOUNTER — Other Ambulatory Visit (HOSPITAL_COMMUNITY): Payer: Self-pay | Admitting: Family Medicine

## 2022-11-20 DIAGNOSIS — Z8701 Personal history of pneumonia (recurrent): Secondary | ICD-10-CM

## 2022-12-04 ENCOUNTER — Ambulatory Visit: Payer: PPO | Admitting: Neurology

## 2022-12-04 ENCOUNTER — Encounter: Payer: Self-pay | Admitting: Neurology

## 2022-12-04 VITALS — BP 123/68 | HR 67 | Ht 70.0 in | Wt 216.4 lb

## 2022-12-04 DIAGNOSIS — Z951 Presence of aortocoronary bypass graft: Secondary | ICD-10-CM

## 2022-12-04 DIAGNOSIS — R519 Headache, unspecified: Secondary | ICD-10-CM | POA: Diagnosis not present

## 2022-12-04 DIAGNOSIS — G4719 Other hypersomnia: Secondary | ICD-10-CM

## 2022-12-04 DIAGNOSIS — Z8673 Personal history of transient ischemic attack (TIA), and cerebral infarction without residual deficits: Secondary | ICD-10-CM | POA: Diagnosis not present

## 2022-12-04 DIAGNOSIS — R0683 Snoring: Secondary | ICD-10-CM | POA: Diagnosis not present

## 2022-12-04 DIAGNOSIS — E669 Obesity, unspecified: Secondary | ICD-10-CM

## 2022-12-04 DIAGNOSIS — R0681 Apnea, not elsewhere classified: Secondary | ICD-10-CM

## 2022-12-04 DIAGNOSIS — Z9189 Other specified personal risk factors, not elsewhere classified: Secondary | ICD-10-CM

## 2022-12-04 NOTE — Patient Instructions (Signed)

## 2022-12-04 NOTE — Progress Notes (Signed)
Subjective:    Patient ID: Mark Foster is a 67 y.o. male.  HPI    Star Age, MD, PhD [facility] [address] [place], [place] [address]  Dear Janett Billow and Mamie Nick,  I saw your patient, Mark Foster, upon your kind request in my sleep clinic today for initial consultation of his sleep disorder, in particular, concern for underlying obstructive sleep apnea.  The patient is accompanied by his wife today.  As you know, Mark Foster is a 67 year old male with an underlying complex medical history of stroke, memory loss, coronary artery disease with status post four-vessel CABG, left ICA occlusion, cervical compression fracture, cervical spinal stenosis, diabetes, neuropathy, diverticulitis, hypothyroidism, pancreatic cancer, left vertebral artery stenosis, prior smoking, and mild obesity, who reports snoring and excessive daytime somnolence as well as witnessed apneas per wife's report.  He occasionally wakes up confused.  His Epworth sleepiness score is 6 out of 24, fatigue severity score is 40 out of 63.  I reviewed your office note from 10/12/2022. He is not aware of any family history of sleep apnea. He lives with his wife.  He is retired, they have no inside pets.  They do have a TV in the bedroom and may be on at night, sometimes they turn it off in the middle of the night.  Bedtime varies, generally between 10 and 1, rise time between 6 and 7 AM.  He drinks decaf coffee, about 2 to 3 cups in the mornings, occasional soda, not daily.  He has had slow weight gain over time, he had pancreatic cancer surgery and chemotherapy in 2020.  He has occasional morning headaches, they are described as dull and achy, typically does not take any medication for these.  He does not have night to night nocturia.  He had a tonsillectomy as a child.  He does not drink any alcohol.  He quit smoking in 2017.  His Past Medical History Is Significant For: Past Medical History:   Diagnosis Date   Bilateral carpal tunnel syndrome 10/19/2020   CAD (coronary artery disease)    a. 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   Type 2 diabetes mellitus     His Past Surgical History Is Significant For: Past Surgical History:  Procedure Laterality Date   APPENDECTOMY     BUBBLE STUDY N/A 11/18/2019   Procedure: BUBBLE STUDY;  Surgeon: Herminio Commons, MD;  Location: [facility];  Service: Cardiology;  Laterality: N/A;   CARDIAC SURGERY     COLON RESECTION     For diverticulitis   COLON SURGERY     CORONARY ARTERY BYPASS GRAFT N/A 01/26/2020   Procedure: CORONARY ARTERY BYPASS GRAFTING (CABG) TIMES FOUR USING LEFT INTERNAL MAMMARY VEIN AND RIGHT GREATER SAPHENOUS VEIN;  Surgeon: Melrose Nakayama, MD;  Location: [place];  Service: Open Heart Surgery;  Laterality: N/A;   ENDOVEIN HARVEST OF GREATER SAPHENOUS VEIN Right 01/26/2020   Procedure: [place] Of Greater Saphenous Vein;  Surgeon: Melrose Nakayama, MD;  Location: [facility]  OR;  Service: Open Heart Surgery;  Laterality: Right;   ESOPHAGOGASTRODUODENOSCOPY N/A 10/03/2018   Procedure: ESOPHAGOGASTRODUODENOSCOPY (EGD);  Surgeon: [name], MD;  Location: [name];  Service: Endoscopy;  Laterality: N/A;   EUS N/A 10/03/2018   Procedure: UPPER ENDOSCOPIC ULTRASOUND (EUS) RADIAL;  Surgeon: [name], MD;  Location: [facility];  Service: Endoscopy;  Laterality: N/A;   EYE SURGERY Bilateral    FINE NEEDLE ASPIRATION N/A 10/03/2018   Procedure: FINE NEEDLE ASPIRATION (FNA) LINEAR;  Surgeon: [name], MD;  Location: [facility];  Service: Endoscopy;  Laterality: N/A;   IR ANGIO INTRA EXTRACRAN SEL COM CAROTID INNOMINATE BILAT MOD SED  01/21/2018   IR ANGIO INTRA EXTRACRAN SEL COM CAROTID INNOMINATE UNI R MOD SED  03/21/2018   IR ANGIO VERTEBRAL SEL VERTEBRAL BILAT MOD SED  01/21/2018   IR TRANSCATH EXCRAN VERT OR CAR A STENT  03/21/2018   LEFT HEART CATH AND CORONARY ANGIOGRAPHY N/A 01/08/2020   Procedure: LEFT HEART CATH AND CORONARY ANGIOGRAPHY;  Surgeon: Leonie Man, MD;  Location: [facility];  Service: Cardiovascular;  Laterality: N/A;   PORT-A-CATH REMOVAL N/A 06/20/2021   Procedure: MINOR REMOVAL PORT-A-CATH;  Surgeon: Virl Cagey, MD;  Location: [facility];  Service: General;  Laterality: N/A;   PORTACATH PLACEMENT Left 12/23/2018   Procedure: INSERTION PORT-A-CATH;  Surgeon: Virl Cagey, MD;  Location: [facility];  Service: General;  Laterality: Left;   RADIOLOGY WITH ANESTHESIA N/A 03/21/2018   Procedure: IR WITH ANESTHESIA WITH STENT PLACEMENT;  Surgeon: Luanne Bras, MD;  Location: [place];  Service: Radiology;  Laterality: N/A;   ROTATOR CUFF REPAIR     Left   SPLENECTOMY     TEE WITHOUT CARDIOVERSION N/A 11/18/2019   Procedure: TRANSESOPHAGEAL ECHOCARDIOGRAM (TEE) WITH PROPOFOL;  Surgeon: Herminio Commons, MD;  Location: [facility];  Service: Cardiology;  Laterality: N/A;   TEE WITHOUT CARDIOVERSION N/A 01/26/2020   Procedure: TRANSESOPHAGEAL ECHOCARDIOGRAM (TEE);  Surgeon: Melrose Nakayama, MD;  Location: [place];  Service: Open Heart Surgery;  Laterality: N/A;   TONSILLECTOMY      His Family History Is Significant For: Family History  Problem Relation Age of Onset   Stroke Mother    Pancreatic cancer Father 4       d. 47   Stroke Maternal Grandmother    Heart attack Maternal Grandfather    Heart Problems Paternal Grandfather    Scoliosis Daughter    Muscular dystrophy Grandson     His Social History Is Significant For: Social History   Socioeconomic History    Marital status: Married    Spouse name: Not on file   Number of children: 2   Years of education: Not on file   Highest education level: Not on file  Occupational History   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  Tobacco Use   Smoking status: Former    Packs/day: .25    Types: Cigarettes    Start date: 10/06/2019   Smokeless tobacco: Never  Vaping Use   Vaping Use: Never used  Substance and Sexual Activity   Alcohol use: Not Currently    Alcohol/week: 1.0 standard drink of alcohol    Types: 1 Cans of beer per week   Drug use: Never   Sexual activity: Not on file  Other Topics Concern   Not on file  Social History Narrative   Caffiene decaff (2-3 cups daily),  soda (dependent on blood glucose)   Married,  3 children -  4 grandchildren.      Social Determinants of Health   Financial Resource Strain: Medium Risk (12/18/2018)   Overall Financial Resource Strain (CARDIA)    Difficulty of Paying Living Expenses: Somewhat hard  Food Insecurity: No Food Insecurity (12/18/2018)   Hunger Vital Sign    Worried About Running Out of Food in the Last Year: Never true    Ran Out of Food in the Last Year: Never true  Transportation Needs: No Transportation Needs (12/18/2018)   PRAPARE - Hydrologist (Medical): No    Lack of Transportation (Non-Medical): No  Physical Activity: Inactive (12/18/2018)   Exercise Vital Sign    Days of Exercise per Week: 0 days    Minutes of Exercise per Session: 0 min  Stress: Stress Concern Present (12/18/2018)   [place]    Feeling of Stress : To some extent  Social Connections: Somewhat Isolated (12/18/2018)   Social Connection and Isolation Panel [NHANES]    Frequency of Communication with Friends and Family: More than three times a week    Frequency of Social Gatherings with Friends and Family: Once a week    Attends Religious Services: More than 4 times per year     Active Member of [employer] or Organizations: No    Attends Archivist Meetings: Never    Marital Status: Divorced    His Allergies Are:  Allergies  Allergen Reactions   Demerol [Meperidine Hcl] Other (See Comments)    convulsions  :   His Current Medications Are:  Outpatient Encounter Medications as of 12/04/2022  Medication Sig   aspirin EC 81 MG tablet Take 1 tablet (81 mg total) by mouth daily with breakfast.   atorvastatin (LIPITOR) 80 MG tablet Take 1 tablet (80 mg total) by mouth daily at 6 PM. (Patient taking differently: Take 80 mg by mouth daily.)   Cholecalciferol (VITAMIN D3) 25 MCG (1000 UT) capsule Take 1,000 Units by mouth daily.   CINNAMON PO Take by mouth.   Continuous Blood Gluc Receiver (FREESTYLE LIBRE 2 READER) DEVI As directed   Continuous Blood Gluc Sensor (FREESTYLE LIBRE 2 SENSOR) MISC USE AS DIRECTED TO CHECK BLOOD SUGAR, REPLACE EVERY 14 DAYS   cyanocobalamin (VITAMIN B12) 1000 MCG tablet Take 1 tablet (1,000 mcg total) by mouth daily.   ECHINACEA COMPLEX PO Take 1,000 mg by mouth daily.   ezetimibe (ZETIA) 10 MG tablet Take by mouth.   FREESTYLE PRECISION NEO TEST test strip USE TO TEST BLOOD SUGAR 4 TIMES DAILY.   gabapentin (NEURONTIN) 300 MG capsule Take 1 capsule (300 mg total) by mouth 3 (three) times daily.   insulin aspart (NOVOLOG FLEXPEN) 100 UNIT/ML FlexPen Inject 14-20 Units into the skin 3 (three) times daily before meals. (Patient taking differently: Inject 18-24 Units into the skin 3 (three) times daily before meals.)   Insulin Pen Needle 32G X 4 MM MISC 1 Device by Does not apply route daily. Use as directed to inject insulin four times daily   levothyroxine (SYNTHROID) 50 MCG tablet TAKE 1 TABLET EVERY DAY   metoprolol succinate (TOPROL-XL) 25 MG 24 hr tablet Take 25 mg by mouth daily.   Multiple Vitamin (MULTIVITAMIN WITH MINERALS) TABS tablet Take 1 tablet by mouth daily.   nitroGLYCERIN (NITROSTAT) 0.4 MG SL tablet Place 1 tablet (0.4  mg total) under the tongue every 5 (five) minutes as needed for chest pain.   Omega-3 1000 MG CAPS  Take 1,000 mg by mouth daily.   sertraline (ZOLOFT) 50 MG tablet TAKE 1 TABLET BY MOUTH DAILY   tamsulosin (FLOMAX) 0.4 MG CAPS capsule Take 1 capsule (0.4 mg total) by mouth daily after supper.   TOUJEO MAX SOLOSTAR 300 UNIT/ML Solostar Pen INJECT 60 UNITS INTO THE SKIN AT BEDTIME.   zinc sulfate 220 (50 Zn) MG capsule Take 1 capsule (220 mg total) by mouth daily.   Facility-Administered Encounter Medications as of 12/04/2022  Medication   0.9 %  sodium chloride infusion   0.9 %  sodium chloride infusion  :   Review of Systems:  Out of a complete 14 point review of systems, all are reviewed and negative with the exception of these symptoms as listed below:  Review of Systems  Neurological:        A lot fatigue, tiredness. ESS 6, FSS 40. Witnessed Apnea spells.  Snores.      Objective:  Neurological Exam  Physical Exam Physical Examination:   Vitals:   12/04/22 1215  BP: 123/68  Pulse: 67    General Examination: The patient is a very pleasant 67 y.o. male in no acute distress. He appears well-developed and well-nourished and well groomed.   HEENT: Normocephalic, atraumatic, pupils are equal, round and reactive to light, extraocular tracking is good without limitation to gaze excursion or nystagmus noted. Hearing is grossly intact. Face is symmetric with normal facial animation. Speech is clear with no dysarthria noted. There is no hypophonia. There is no lip, neck/head, jaw or voice tremor. Neck is supple with full range of passive and active motion. There are no carotid bruits on auscultation. Oropharynx exam reveals: mild mouth dryness, adequate dental hygiene with full dentures in place, moderate airway crowding secondary to redundant soft palate and wider uvula.  Mallampati class III.  Neck circumference 17 inches.  Tongue protrudes centrally and palate elevates symmetrically,  tonsils absent.   Chest: Clear to auscultation without wheezing, rhonchi or crackles noted.  Heart: S1+S2+0, regular and normal without murmurs, rubs or gallops noted.   Abdomen: Soft, non-tender and non-distended.  Extremities: There is no pitting edema in the distal lower extremities bilaterally.   Skin: Warm and dry without trophic changes noted.   Musculoskeletal: exam reveals no obvious joint deformities.   Neurologically:  Mental status: The patient is awake, alert and oriented in all 4 spheres. His immediate and remote memory, attention, language skills and fund of knowledge are appropriate. There is no evidence of aphasia, agnosia, apraxia or anomia. Speech is clear with normal prosody and enunciation. Thought process is linear. Mood is normal and affect is normal.  Cranial nerves II - XII are as described above under HEENT exam.  Motor exam: Normal bulk, strength and tone is noted. There is no obvious action or resting tremor.  Fine motor skills and coordination: grossly intact.  Cerebellar testing: No dysmetria or intention tremor. There is no truncal or gait ataxia.  Sensory exam: intact to light touch in the upper and lower extremities.  Gait, station and balance: He stands easily. No veering to one side is noted. No leaning to one side is noted. Posture is age-appropriate and stance is narrow based. Gait shows normal stride length and normal pace. No problems turning are noted.   Assessment and plan:  In summary, Mark Foster is a very pleasant 67 y.o.-year old male with an underlying complex medical history of stroke, memory loss, coronary artery disease with status post four-vessel CABG, left ICA  occlusion, cervical compression fracture, cervical spinal stenosis, diabetes, neuropathy, diverticulitis, hypothyroidism, pancreatic cancer, left vertebral artery stenosis, prior smoking, and mild obesity, whose history and physical exam are concerning for sleep disordered breathing,  particularly obstructive sleep apnea (OSA). A laboratory attended sleep study is typically considered "gold standard" for evaluation of sleep disordered breathing.   I had a long chat with the patient and his wife about my findings and the diagnosis of sleep apnea , particularly OSA, its prognosis and treatment options. We talked about medical/conservative treatments, surgical interventions and non-pharmacological approaches for symptom control. I explained, in particular, the risks and ramifications of untreated moderate to severe OSA, especially with respect to developing cardiovascular disease down the road, including congestive heart failure (CHF), difficult to treat hypertension, cardiac arrhythmias (particularly A-fib), neurovascular complications including TIA, stroke and dementia. Even type 2 diabetes has, in part, been linked to untreated OSA. Symptoms of untreated OSA may include (but may not be limited to) daytime sleepiness, nocturia (i.e. frequent nighttime urination), memory problems, mood irritability and suboptimally controlled or worsening mood disorder such as depression and/or anxiety, lack of energy, lack of motivation, physical discomfort, as well as recurrent headaches, especially morning or nocturnal headaches. We talked about the importance of maintaining a healthy lifestyle and striving for healthy weight.  In addition, we talked about the importance of striving for and maintaining good sleep hygiene. I recommended a sleep study at this time. I outlined the differences between a laboratory attended sleep study which is considered more comprehensive and accurate over the option of a home sleep test (HST); the latter may lead to underestimation of sleep disordered breathing in some instances and does not help with diagnosing upper airway resistance syndrome and is not accurate enough to diagnose primary central sleep apnea typically. I outlined possible surgical and non-surgical treatment  options of OSA, including the use of a positive airway pressure (PAP) device (i.e. CPAP, AutoPAP/APAP or BiPAP in certain circumstances), a custom-made dental device (aka oral appliance, which would require a referral to a specialist dentist or orthodontist typically, and is generally speaking not considered for patients with full dentures or edentulous state), upper airway surgical options, such as traditional UPPP (which is not considered a first-line treatment) or the Inspire device (hypoglossal nerve stimulator, which would involve a referral for consultation with an ENT surgeon, after careful selection, following inclusion criteria - also not first-line treatment). I explained the PAP treatment option to the patient in detail, as this is generally considered first-line treatment.  The patient indicated that he would be reluctant but willing to try PAP therapy, if the need arises. I explained the importance of being compliant with PAP treatment, not only for insurance purposes but primarily to improve patient's symptoms symptoms, and for the patient's long term health benefit, including to reduce His cardiovascular risks longer-term.    We will pick up our discussion about the next steps and treatment options after testing.  We will keep him posted as to the test results by phone call and/or MyChart messaging where possible.  We will plan to follow-up in sleep clinic accordingly as well.  I answered all their questions today and the patient and his wife were in agreement.   I encouraged them to call with any interim questions, concerns, problems or updates or email [place] through [place].  Generally speaking, sleep test authorizations may take up to 2 weeks, sometimes less, sometimes longer, the patient is encouraged to get in touch with [place] if they  do not hear back from the sleep lab staff directly within the next 2 weeks.  Thank you very much for allowing me to participate in the care of this nice patient. If  I can be of any further assistance to you please do not hesitate to talk to me.   Sincerely,   Star Age, MD, PhD

## 2022-12-05 DIAGNOSIS — K4090 Unilateral inguinal hernia, without obstruction or gangrene, not specified as recurrent: Secondary | ICD-10-CM | POA: Diagnosis not present

## 2022-12-05 DIAGNOSIS — K439 Ventral hernia without obstruction or gangrene: Secondary | ICD-10-CM | POA: Diagnosis not present

## 2022-12-05 DIAGNOSIS — K429 Umbilical hernia without obstruction or gangrene: Secondary | ICD-10-CM | POA: Diagnosis not present

## 2022-12-19 ENCOUNTER — Inpatient Hospital Stay: Payer: PPO | Attending: Hematology

## 2022-12-19 ENCOUNTER — Telehealth: Payer: Self-pay | Admitting: Neurology

## 2022-12-19 ENCOUNTER — Ambulatory Visit (HOSPITAL_COMMUNITY)
Admission: RE | Admit: 2022-12-19 | Discharge: 2022-12-19 | Disposition: A | Discharge status: Home / Self Care | Payer: PPO | Source: Ambulatory Visit | Attending: Hematology | Admitting: Hematology

## 2022-12-19 DIAGNOSIS — Z9081 Acquired absence of spleen: Secondary | ICD-10-CM | POA: Diagnosis not present

## 2022-12-19 DIAGNOSIS — Z87891 Personal history of nicotine dependence: Secondary | ICD-10-CM | POA: Diagnosis not present

## 2022-12-19 DIAGNOSIS — C259 Malignant neoplasm of pancreas, unspecified: Secondary | ICD-10-CM

## 2022-12-19 DIAGNOSIS — Z8507 Personal history of malignant neoplasm of pancreas: Secondary | ICD-10-CM | POA: Insufficient documentation

## 2022-12-19 DIAGNOSIS — K439 Ventral hernia without obstruction or gangrene: Secondary | ICD-10-CM | POA: Diagnosis not present

## 2022-12-19 DIAGNOSIS — D649 Anemia, unspecified: Secondary | ICD-10-CM | POA: Diagnosis not present

## 2022-12-19 DIAGNOSIS — K5730 Diverticulosis of large intestine without perforation or abscess without bleeding: Secondary | ICD-10-CM | POA: Insufficient documentation

## 2022-12-19 DIAGNOSIS — K760 Fatty (change of) liver, not elsewhere classified: Secondary | ICD-10-CM | POA: Insufficient documentation

## 2022-12-19 DIAGNOSIS — Z800 Family history of malignant neoplasm of digestive organs: Secondary | ICD-10-CM | POA: Diagnosis not present

## 2022-12-19 DIAGNOSIS — Z9221 Personal history of antineoplastic chemotherapy: Secondary | ICD-10-CM | POA: Insufficient documentation

## 2022-12-19 DIAGNOSIS — Z90411 Acquired partial absence of pancreas: Secondary | ICD-10-CM | POA: Insufficient documentation

## 2022-12-19 DIAGNOSIS — I700 Atherosclerosis of aorta: Secondary | ICD-10-CM | POA: Insufficient documentation

## 2022-12-19 DIAGNOSIS — K8020 Calculus of gallbladder without cholecystitis without obstruction: Secondary | ICD-10-CM | POA: Diagnosis not present

## 2022-12-19 LAB — COMPREHENSIVE METABOLIC PANEL
ALT: 22 U/L (ref 0–44)
AST: 20 U/L (ref 15–41)
Albumin: 3.8 g/dL (ref 3.5–5.0)
Alkaline Phosphatase: 61 U/L (ref 38–126)
Anion gap: 1 — ABNORMAL LOW (ref 5–15)
BUN: 19 mg/dL (ref 8–23)
CO2: 27 mmol/L (ref 22–32)
Calcium: 8.4 mg/dL — ABNORMAL LOW (ref 8.9–10.3)
Chloride: 107 mmol/L (ref 98–111)
Creatinine, Ser: 0.75 mg/dL (ref 0.61–1.24)
GFR, Estimated: >60 mL/min (ref >60)
Glucose, Bld: 188 mg/dL — ABNORMAL HIGH (ref 70–99)
Potassium: 4.4 mmol/L (ref 3.5–5.1)
Sodium: 135 mmol/L (ref 135–145)
Total Bilirubin: 1 mg/dL (ref 0.3–1.2)
Total Protein: 7.1 g/dL (ref 6.5–8.1)

## 2022-12-19 LAB — CBC WITH DIFFERENTIAL/PLATELET
Abs Immature Granulocytes: 0.01 10*3/uL (ref 0.00–0.07)
Basophils Absolute: 0 10*3/uL (ref 0.0–0.1)
Basophils Relative: 0 %
Eosinophils Absolute: 0.1 10*3/uL (ref 0.0–0.5)
Eosinophils Relative: 3 %
HCT: 36.1 % — ABNORMAL LOW (ref 39.0–52.0)
Hemoglobin: 12.6 g/dL — ABNORMAL LOW (ref 13.0–17.0)
Immature Granulocytes: 0 %
Lymphocytes Relative: 67 %
Lymphs Abs: 1.7 10*3/uL (ref 0.7–4.0)
MCH: 35.8 pg — ABNORMAL HIGH (ref 26.0–34.0)
MCHC: 34.9 g/dL (ref 30.0–36.0)
MCV: 102.6 fL — ABNORMAL HIGH (ref 80.0–100.0)
Monocytes Absolute: 0.1 10*3/uL (ref 0.1–1.0)
Monocytes Relative: 4 %
Neutro Abs: 0.7 10*3/uL — ABNORMAL LOW (ref 1.7–7.7)
Neutrophils Relative %: 26 %
Platelets: 98 10*3/uL — ABNORMAL LOW (ref 150–400)
RBC: 3.52 MIL/uL — ABNORMAL LOW (ref 4.22–5.81)
RDW: 16.9 % — ABNORMAL HIGH (ref 11.5–15.5)
WBC: 2.5 10*3/uL — ABNORMAL LOW (ref 4.0–10.5)
nRBC: 0 % (ref 0.0–0.2)

## 2022-12-19 MED ORDER — IOHEXOL 300 MG/ML  SOLN
100.0000 mL | Freq: Once | INTRAMUSCULAR | Status: AC | PRN
  Administered 2022-12-19: 100 mL via INTRAVENOUS

## 2022-12-19 NOTE — Telephone Encounter (Signed)
[facility] pending faxed notes 

## 2022-12-21 LAB — CANCER ANTIGEN 19-9: CA 19-9: 11 U/mL (ref 0–35)

## 2022-12-25 NOTE — Progress Notes (Signed)
[facility] [address] [place] [address]    Clinic Day:  12/26/2022  Referring physician: Benita Stabile, MD  Patient Care Team: Benita Stabile, MD as PCP - General (Internal Medicine) Jonelle Sidle, MD as PCP - Cardiology (Cardiology) Doreatha Massed, MD as Consulting Physician (Hematology and Oncology)   ASSESSMENT & PLAN:   Assessment: 1.  Stage IIb (T2N1) pancreatic adenocarcinoma: -Status post distal pancreatectomy and splenectomy at [facility] by Dr. Freida Busman on 11/20/2018. 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 -12 cycles of FOLFIRINOX from 12/30/2018 through 06/04/2019. -MRI of the brain on 05/19/2019 did not show any abnormalities.  This was done because of new onset numbness in the left lower lip during therapy. -CT CAP on 03/24/2020 shows unchanged low-attenuation lesion of the pancreatic uncinate measuring 1.2 x 0.8 cm.  Unchanged low-attenuation lesion near the surgical margin of the pancreatic head and neck junction measuring 1.2 x 0.9 cm.  These may reflect postoperative seroma.  Unchanged prominent retroperitoneal lymph nodes.   2.  Post splenectomy state: -He received vaccination prior to splenectomy.    Plan: 1.  Stage IIb (T2N1) pancreatic adenocarcinoma: - He does not have any clinical signs or symptoms of recurrence. - CTAP 12/19/2022: No evidence of recurrence. - He was recently hospitalized with pneumonia and UTI and COVID from 10/29/2022 through 10/31/2022. - Labs: White count and platelet count is lower than his baseline with mild anemia.  CA 19-9 was normal.  LFTs were normal.  Mild cytopenias from recent infection. - Recommend follow-up in 6 months with repeat CTAP and labs.       Orders Placed This Encounter  Procedures   CT Abdomen Pelvis W Contrast    Standing Status:   Future    Order Specific Question:   If indicated for the ordered procedure, I authorize the administration of contrast media per Radiology protocol     Answer:   Yes    Order Specific Question:   Does the patient have a contrast media/X-ray dye allergy?    Answer:   No    Order Specific Question:   Preferred imaging location?    Answer:   [facility]    Order Specific Question:   If indicated for the ordered procedure, I authorize the administration of oral contrast media per Radiology protocol    Answer:   Yes      I,Katie Daubenspeck,acting as a scribe for Doreatha Massed, MD.,have documented all relevant documentation on the behalf of Doreatha Massed, MD,as directed by  Doreatha Massed, MD while in the presence of Doreatha Massed, MD.   I, Doreatha Massed MD, have reviewed the above documentation for accuracy and completeness, and I agree with the above.   Doreatha Massed, MD   4/23/202412:52 PM  CHIEF COMPLAINT:   Diagnosis: pancreatic adenocarcinoma    Cancer Staging  No matching staging information was found for the patient.   Prior Therapy: 1. Distal pancreatectomy and splenectomy at [facility] on 11/20/2018. 2. Adjuvant FOLFIRINOX x 12 cycles from 12/30/2018 to 06/04/2019.  Current Therapy:  Surveillance    HISTORY OF PRESENT ILLNESS:   Oncology History  Pancreatic adenocarcinoma  08/20/2018 Imaging   CT abdomen/pelvis w/ contrast: IMPRESSION: Atrophy and ductal dilatation involving the pancreatic tail, with suspected small soft tissue mass in the pancreatic body which could represent pancreatic carcinoma. Abdomen MRI and MRCP without and with contrast is recommended for further evaluation.   No evidence of hepatobiliary disease.   08/30/2018  Imaging   MRI abdomen w/ contrast: IMPRESSION: 1. Although not definitive, there remains concern of a small hypoenhancing mass at the junction of the pancreatic body and tail associated with atrophy and ductal dilatation in the pancreatic tail. This remains concerning for pancreatic neoplasm. Postinflammatory stricture less likely. Besides  a tiny cystic lesion in the pancreatic tail, there are no other signs of previous pancreatitis. Further evaluation with endoscopic ultrasound for possible biopsy strongly recommended. 2. No evidence of metastatic disease. 3. Cholelithiasis without evidence of cholecystitis or biliary dilatation.   09/03/2018 Initial Diagnosis   Pancreatic adenocarcinoma (HCC)   10/03/2018 Procedure   EUS: - 2.6cm irregularly shaped mass in the body of the pancreas that causing main pancreatic duct obstruction and dilation. The mass abuts the splenic vessels but no other significant vascular structures and it was sampled with trangastric EUS FNA. Preliminary cytology is + for malignancy, likely well-differentiated adenocarcinoma. It appears surgically resectable.   10/03/2018 Pathology Results   Accession: [id number]  FINE NEEDLE ASPIRATION, ENDOSCOPIC, PANCREAS BODY (SPECIMEN 1 OF 1 COLLECTED 10/03/18): ADENOCARCINOMA.   10/17/2018 Imaging   PET: IMPRESSION: 1. Tiny focus of hypermetabolism identified in the body of pancreas, adjacent to the abrupt cut off of the main pancreatic duct. No evidence for hypermetabolic metastatic disease in the neck, chest, abdomen, or pelvis. 2. Cholelithiasis. 3.  Aortic Atherosclerois (ICD10-170.0) 4. 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  The Hereditary Gene Panel offered by [facility] includes sequencing and/or deletion duplication testing of the following 47 genes: APC, ATM, AXIN2, BARD1, BMPR1A, BRCA1, BRCA2, BRIP1, CDH1, CDK4, CDKN2A (p14ARF), CDKN2A (p16INK4a), CHEK2, CTNNA1, DICER1, EPCAM (Deletion/duplication testing only), GREM1 (promoter region deletion/duplication testing only), KIT, MEN1, MLH1, MSH2, MSH3, MSH6, MUTYH, NBN, NF1, NHTL1, PALB2, PDGFRA, PMS2, POLD1, POLE, PTEN, RAD50, RAD51C, RAD51D, SDHB, SDHC, SDHD, SMAD4, SMARCA4. STK11, TP53, TSC1, TSC2, and VHL.  The following genes were  evaluated for sequence changes only: SDHA and HOXB13 c.251G>A variant only. The report date is November 12, 2018.  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 The change in variant classification was made as a result of re-review of the evidence in light of new variant interpretation guidelines and/or new information. The amended report date is March 11, 2020.    11/20/2018 Surgery   Distal subtotal pancreatectomy with splenectomy at [facility]   11/20/2018 Surgery   TUMOR    Tumor Site:    Pancreatic body     Histologic Type:    Ductal adenocarcinoma     Histologic Grade:    G1: Well differentiated     Tumor Size:    Greatest dimension in Centimeters (cm): 2.8 Centimeters (cm)      Additional Dimension in Centimeters (cm):    2.7 Centimeters (cm)      Additional Dimension in Centimeters (cm):    1.8 Centimeters (cm)    Tumor Extent:          Tumor Extension:    Tumor is confined to pancreas     Accessory Findings:          Treatment Effect:    No known presurgical therapy       Lymphovascular Invasion:    Not identified       Perineural Invasion:    Not identified   MARGINS    Margins:          Proximal Pancreatic Parenchymal Margin:  Uninvolved by invasive carcinoma and pancreatic high-grade intraepithelial neoplasia         Distance of Invasive Carcinoma from Margin:    0.6 Centimeters (cm)    :          Other Margin:    Splenic margin.         Margin Status:    Uninvolved by invasive carcinoma   LYMPH NODES  Number of Lymph Nodes Involved:    2   Number of Lymph Nodes Examined:    16   PATHOLOGIC STAGE CLASSIFICATION  (pTNM, AJCC 8th Edition)  TNM Descriptors:    Not applicable   Primary Tumor (pT):    pT2   Regional Lymph Nodes (pN):    pN1   ADDITIONAL FINDINGS  Additional Pathologic Findings:    Pancreatic intraepithelial neoplasia     Highest Grade (PanIN):    High grade PanIN is present.   Additional  Pathologic Findings:    Benign unilocular pancreatic cyst (1.3 cm in greatest dimension) at the pancreatic tail.   12/30/2018 - 06/06/2019 Chemotherapy   Patient is on Treatment Plan : PANCREAS FOLFIRINOX q14d        INTERVAL HISTORY:   Mark Foster is a 67 y.o. male presenting to clinic today for follow up of pancreatic adenocarcinoma. He was last seen by me on 06/20/22.  Since his last visit, he underwent surveillance CT A/P on 12/19/22 showing: no findings of locally recurrent disease or definite metastatic disease; similar 1.4 cm cystic lesion in uncinate process of pancreas.  He was also hospitalized from 10/29/22 through 10/31/22 for Covid infection and sepsis due to UTI.  Today, he states that he is doing well overall. His appetite level is at 100%. His energy level is at 70%.  PAST MEDICAL HISTORY:   Past Medical History: Past Medical History:  Diagnosis Date   Bilateral carpal tunnel syndrome 10/19/2020   CAD (coronary artery disease)    a. 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   Type 2 diabetes mellitus     Surgical History: Past Surgical History:  Procedure Laterality Date   APPENDECTOMY     BUBBLE STUDY N/A 11/18/2019   Procedure: BUBBLE STUDY;  Surgeon: Laqueta Linden, MD;  Location: [facility];  Service: Cardiology;  Laterality: N/A;   CARDIAC SURGERY     COLON RESECTION     For diverticulitis   COLON SURGERY     CORONARY ARTERY BYPASS GRAFT N/A 01/26/2020   Procedure: CORONARY ARTERY BYPASS GRAFTING (CABG) TIMES FOUR USING LEFT INTERNAL MAMMARY VEIN AND RIGHT GREATER SAPHENOUS VEIN;  Surgeon:  Loreli Slot, MD;  Location: [facility] OR;  Service: Open Heart Surgery;  Laterality: N/A;   ENDOVEIN HARVEST OF GREATER SAPHENOUS VEIN Right 01/26/2020   Procedure: Mack Guise Of Greater Saphenous Vein;  Surgeon: Loreli Slot, MD;  Location: [facility] OR;  Service: Open Heart Surgery;  Laterality: Right;   ESOPHAGOGASTRODUODENOSCOPY N/A 10/03/2018   Procedure: ESOPHAGOGASTRODUODENOSCOPY (EGD);  Surgeon: Rachael Fee, MD;  Location: [facility];  Service: Endoscopy;  Laterality:  N/A;   EUS N/A 10/03/2018   Procedure: UPPER ENDOSCOPIC ULTRASOUND (EUS) RADIAL;  Surgeon: Rachael Fee, MD;  Location: [facility] END[facility]COPY;  Service: Endoscopy;  Laterality: N/A;   EYE SURGERY Bilateral    FINE NEEDLE ASPIRATION N/A 10/03/2018   Procedure: FINE NEEDLE ASPIRATION (FNA) LINEAR;  Surgeon: Rachael Fee, MD;  Location: [facility] ENDOSCOPY;  Service: Endoscopy;  Laterality: N/A;   IR ANGIO INTRA EXTRACRAN SEL COM CAROTID INNOMINATE BILAT MOD SED  01/21/2018   IR ANGIO INTRA EXTRACRAN SEL COM CAROTID INNOMINATE UNI R MOD SED  03/21/2018   IR ANGIO VERTEBRAL SEL VERTEBRAL BILAT MOD SED  01/21/2018   IR TRANSCATH EXCRAN VERT OR CAR A STENT  03/21/2018   LEFT HEART CATH AND CORONARY ANGIOGRAPHY N/A 01/08/2020   Procedure: LEFT HEART CATH AND CORONARY ANGIOGRAPHY;  Surgeon: Marykay Lex, MD;  Location: [facility] INVASIVE CV LAB;  Service: Cardiovascular;  Laterality: N/A;   PORT-A-CATH REMOVAL N/A 06/20/2021   Procedure: MINOR REMOVAL PORT-A-CATH;  Surgeon: Lucretia Roers, MD;  Location: [facility];  Service: General;  Laterality: N/A;   PORTACATH PLACEMENT Left 12/23/2018   Procedure: INSERTION PORT-A-CATH;  Surgeon: Lucretia Roers, MD;  Location: [facility];  Service: General;  Laterality: Left;   RADIOLOGY WITH ANESTHESIA N/A 03/21/2018   Procedure: IR WITH ANESTHESIA WITH STENT PLACEMENT;  Surgeon: Julieanne Cotton, MD;  Location: [facility] OR;  Service: Radiology;  Laterality: N/A;   ROTATOR CUFF REPAIR     Left    SPLENECTOMY     TEE WITHOUT CARDIOVERSION N/A 11/18/2019   Procedure: TRANSESOPHAGEAL ECHOCARDIOGRAM (TEE) WITH PROPOFOL;  Surgeon: Laqueta Linden, MD;  Location: [facility];  Service: Cardiology;  Laterality: N/A;   TEE WITHOUT CARDIOVERSION N/A 01/26/2020   Procedure: TRANSESOPHAGEAL ECHOCARDIOGRAM (TEE);  Surgeon: Loreli Slot, MD;  Location: [facility] OR;  Service: Open Heart Surgery;  Laterality: N/A;   TONSILLECTOMY      Social History: Social History   Socioeconomic History   Marital status: Married    Spouse name: Not on file   Number of children: 2   Years of education: Not on file   Highest education level: Not on file  Occupational History   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  Tobacco Use   Smoking status: Former    Packs/day: .25    Types: Cigarettes    Start date: 10/06/2019   Smokeless tobacco: Never  Vaping Use   Vaping Use: Never used  Substance and Sexual Activity   Alcohol use: Not Currently    Alcohol/week: 1.0 standard drink of alcohol    Types: 1 Cans of beer per week   Drug use: Never   Sexual activity: Not on file  Other Topics Concern   Not on file  Social History Narrative   Caffiene decaff (2-3 cups daily),  soda (dependent on blood glucose)   Married,  3 children - 4 grandchildren.      Social Determinants of Health   Financial Resource Strain: Medium Risk (12/18/2018)   Overall Financial Resource Strain (CARDIA)    Difficulty of Paying Living Expenses: Somewhat hard  Food Insecurity: No Food Insecurity (12/18/2018)   Hunger Vital Sign    Worried About Running Out of Food in the Last Year: Never true    Ran Out of Food in the Last Year: Never true  Transportation Needs: No Transportation Needs (12/18/2018)   PRAPARE - Administrator, Civil Service (Medical): No    Lack of Transportation (Non-Medical): No  Physical Activity: Inactive (12/18/2018)   Exercise Vital Sign    Days of Exercise per Week: 0 days    Minutes of Exercise per  Session: 0 min  Stress: Stress Concern Present (12/18/2018)   [employer] of Occupational Health - Occupational Stress Questionnaire    Feeling of Stress : To some extent  Social Connections: Somewhat Isolated (12/18/2018)   Social Connection and Isolation Panel [NHANES]    Frequency of Communication with Friends and Family: More than three times a week    Frequency of Social Gatherings with Friends and Family: Once a week    Attends Religious Services: More than 4 times per year    Active Member of [employer] or Organizations: No    Attends Banker Meetings: Never    Marital Status: Divorced  Catering manager Violence: Not At Risk (08/16/2020)   Humiliation, Afraid, Rape, and Kick questionnaire    Fear of Current or Ex-Partner: No    Emotionally Abused: No    Physically Abused: No    Sexually Abused: No    Family History: Family History  Problem Relation Age of Onset   Stroke Mother    Pancreatic cancer Father 28       d. 6   Stroke Maternal Grandmother    Heart attack Maternal Grandfather    Heart Problems Paternal Grandfather    Scoliosis Daughter    Muscular dystrophy Grandson     Current Medications:  Current Outpatient Medications:    aspirin EC 81 MG tablet, Take 1 tablet (81 mg total) by mouth daily with breakfast., Disp: 90 tablet, Rfl: 3   atorvastatin (LIPITOR) 80 MG tablet, Take 1 tablet (80 mg total) by mouth daily at 6 PM. (Patient taking differently: Take 80 mg by mouth daily.), Disp: 90 tablet, Rfl: 1   Cholecalciferol (VITAMIN D3) 25 MCG (1000 UT) capsule, Take 1,000 Units by mouth daily., Disp: , Rfl:    CINNAMON PO, Take by mouth., Disp: , Rfl:    Continuous Blood Gluc Receiver (FREESTYLE LIBRE 2 READER) DEVI, As directed, Disp: 1 each, Rfl: 0   Continuous Blood Gluc Sensor (FREESTYLE LIBRE 2 SENSOR) MISC, USE AS DIRECTED TO CHECK BLOOD SUGAR, REPLACE EVERY 14 DAYS, Disp: 2 each, Rfl: 1   cyanocobalamin (VITAMIN B12) 1000 MCG tablet, Take 1  tablet (1,000 mcg total) by mouth daily., Disp: 90 tablet, Rfl: 3   ECHINACEA COMPLEX PO, Take 1,000 mg by mouth daily., Disp: , Rfl:    ezetimibe (ZETIA) 10 MG tablet, Take by mouth., Disp: , Rfl:    FREESTYLE PRECISION NEO TEST test strip, USE TO TEST BLOOD SUGAR 4 TIMES DAILY., Disp: 100 strip, Rfl: 2   gabapentin (NEURONTIN) 300 MG capsule, Take 1 capsule (300 mg total) by mouth 3 (three) times daily., Disp: 270 capsule, Rfl: 3   insulin aspart (NOVOLOG FLEXPEN) 100 UNIT/ML FlexPen, Inject 14-20 Units into the skin 3 (three) times daily before meals. (Patient taking differently: Inject 18-24 Units into the skin 3 (three) times daily before meals.), Disp: 30 mL, Rfl: 1   Insulin Pen Needle 32G X 4 MM MISC, 1 Device by Does not apply route daily. Use as directed to inject insulin four times daily, Disp: 200 each, Rfl: 2   levothyroxine (SYNTHROID) 50 MCG tablet, TAKE 1 TABLET EVERY DAY, Disp: 90 tablet, Rfl: 1   metoprolol succinate (TOPROL-XL) 25 MG 24 hr tablet, Take 25 mg by mouth daily., Disp: , Rfl:    Multiple Vitamin (MULTIVITAMIN WITH MINERALS)  TABS tablet, Take 1 tablet by mouth daily., Disp: 100 tablet, Rfl: 2   Omega-3 1000 MG CAPS, Take 1,000 mg by mouth daily., Disp: , Rfl:    sertraline (ZOLOFT) 50 MG tablet, TAKE 1 TABLET BY MOUTH DAILY, Disp: 90 tablet, Rfl: 1   tamsulosin (FLOMAX) 0.4 MG CAPS capsule, Take 1 capsule (0.4 mg total) by mouth daily after supper., Disp: 90 capsule, Rfl: 3   TOUJEO MAX SOLOSTAR 300 UNIT/ML Solostar Pen, INJECT 60 UNITS INTO THE SKIN AT BEDTIME., Disp: 6 mL, Rfl: 0   zinc sulfate 220 (50 Zn) MG capsule, Take 1 capsule (220 mg total) by mouth daily., Disp: 90 capsule, Rfl: 3   nitroGLYCERIN (NITROSTAT) 0.4 MG SL tablet, Place 1 tablet (0.4 mg total) under the tongue every 5 (five) minutes as needed for chest pain. (Patient not taking: Reported on 12/26/2022), Disp: 25 tablet, Rfl: 3 No current facility-administered medications for this  visit.  Facility-Administered Medications Ordered in Other Visits:    0.9 %  sodium chloride infusion, , Intravenous, Continuous, Doreatha Massed, MD, Last Rate: 20 mL/hr at 12/30/18 1351, New Bag at 12/30/18 1351   0.9 %  sodium chloride infusion, , Intravenous, Continuous, Doreatha Massed, MD, Last Rate: 20 mL/hr at 12/30/18 1401, New Bag at 12/30/18 1401   Allergies: Allergies  Allergen Reactions   Demerol [Meperidine Hcl] Other (See Comments)    convulsions    REVIEW OF SYSTEMS:   Review of Systems  Constitutional:  Negative for chills, fatigue and fever.  HENT:   Negative for lump/mass, mouth sores, nosebleeds, sore throat and trouble swallowing.   Eyes:  Negative for eye problems.  Respiratory:  Positive for shortness of breath. Negative for cough.   Cardiovascular:  Negative for chest pain, leg swelling and palpitations.  Gastrointestinal:  Positive for abdominal pain. Negative for constipation, diarrhea, nausea and vomiting.  Genitourinary:  Negative for bladder incontinence, difficulty urinating, dysuria, frequency, hematuria and nocturia.   Musculoskeletal:  Negative for arthralgias, back pain, flank pain, myalgias and neck pain.  Skin:  Negative for itching and rash.  Neurological:  Positive for dizziness, headaches and numbness.  Hematological:  Does not bruise/bleed easily.  Psychiatric/Behavioral:  Negative for depression, sleep disturbance and suicidal ideas. The patient is not nervous/anxious.   All other systems reviewed and are negative.    VITALS:   Blood pressure 137/67, pulse 67, temperature 98.1 F (36.7 C), temperature source Oral, resp. rate (!) 156, weight 218 lb 6.4 oz (99.1 kg), SpO2 98 %.  Wt Readings from Last 3 Encounters:  12/26/22 218 lb 6.4 oz (99.1 kg)  12/04/22 216 lb 6.4 oz (98.2 kg)  10/29/22 220 lb (99.8 kg)    Body mass index is 31.34 kg/m.  Performance status (ECOG): 1 - Symptomatic but completely ambulatory  PHYSICAL  EXAM:   Physical Exam Vitals and nursing note reviewed. Exam conducted with a chaperone present.  Constitutional:      Appearance: Normal appearance.  Cardiovascular:     Rate and Rhythm: Normal rate and regular rhythm.     Pulses: Normal pulses.     Heart sounds: Normal heart sounds.  Pulmonary:     Effort: Pulmonary effort is normal.     Breath sounds: Normal breath sounds.  Abdominal:     Palpations: Abdomen is soft. There is no hepatomegaly, splenomegaly or mass.     Tenderness: There is no abdominal tenderness.  Musculoskeletal:     Right lower leg: No edema.  Left lower leg: No edema.  Lymphadenopathy:     Cervical: No cervical adenopathy.     Right cervical: No superficial, deep or posterior cervical adenopathy.    Left cervical: No superficial, deep or posterior cervical adenopathy.     Upper Body:     Right upper body: No supraclavicular or axillary adenopathy.     Left upper body: No supraclavicular or axillary adenopathy.  Neurological:     General: No focal deficit present.     Mental Status: He is alert and oriented to person, place, and time.  Psychiatric:        Mood and Affect: Mood normal.        Behavior: Behavior normal.     LABS:      Latest Ref Rng & Units 12/19/2022    9:16 AM 10/30/2022    4:15 AM 10/29/2022    8:35 AM  CBC  WBC 4.0 - 10.5 K/uL 2.5  1.7  2.1   Hemoglobin 13.0 - 17.0 g/dL 16.1  09.6  04.5   Hematocrit 39.0 - 52.0 % 36.1  36.7  35.3   Platelets 150 - 400 K/uL 98  72  81       Latest Ref Rng & Units 12/19/2022    9:16 AM 10/31/2022    3:59 AM 10/30/2022    4:15 AM  CMP  Glucose 70 - 99 mg/dL 409  811  914   BUN 8 - 23 mg/dL Creatinine 0.61 - 1.24 mg/dL 7.82  9.56  2.13   Sodium 135 - 145 mmol/L 135  134  135   Potassium 3.5 - 5.1 mmol/L 4.4  4.2  4.1   Chloride 98 - 111 mmol/L 107  109  108   CO2 22 - 32 mmol/L Calcium 8.9 - 10.3 mg/dL 8.4  7.7  7.7   Total Protein 6.5 - 8.1 g/dL 7.1  6.1  6.2    Total Bilirubin 0.3 - 1.2 mg/dL 1.0  0.8  1.4   Alkaline Phos 38 - 126 U/L 61  54  60   AST 15 - 41 U/L ALT 0 - 44 U/L No results found for: "CEA1", "CEA" / No results found for: "CEA1", "CEA" No results found for: "PSA1" Lab Results  Component Value Date   YQM578 11 12/19/2022   No results found for: "ION629"  Lab Results  Component Value Date   TOTALPROTELP 6.6 06/21/2018   ALBUMINELP 3.9 06/21/2018   A1GS 0.3 06/21/2018   A2GS 0.6 06/21/2018   BETS 0.8 06/21/2018   GAMS 0.9 06/21/2018   MSPIKE Not Observed 06/21/2018   SPEI Comment 06/21/2018   Lab Results  Component Value Date   TIBC 264 07/04/2018   FERRITIN 325 10/31/2022   FERRITIN 263 10/30/2022   FERRITIN 191 07/04/2018   IRONPCTSAT 25 07/04/2018   Lab Results  Component Value Date   LDH 157 06/21/2018     STUDIES:   CT Abdomen Pelvis W Contrast  Result Date: 12/22/2022 CLINICAL DATA:  67 year old male with history of pancreatic cancer. Status post distal pancreatectomy and splenectomy along with chemotherapy in 2020. Follow-up study. * Tracking Code: BO * EXAM: CT ABDOMEN AND PELVIS WITH CONTRAST TECHNIQUE: Multidetector CT imaging of the abdomen and pelvis was performed using the standard protocol following bolus administration of intravenous contrast.  RADIATION DOSE REDUCTION: This exam was performed according to the departmental dose-optimization program which includes automated exposure control, adjustment of the mA and/or kV according to patient size and/or use of iterative reconstruction technique. CONTRAST:  OMNIPAQUE IOHEXOL 300 MG/ML  SOLN COMPARISON:  CT of the abdomen and pelvis 10/29/2022. FINDINGS: Lower chest: Atherosclerotic calcifications are noted in the descending thoracic aorta as well as the right coronary artery. Hepatobiliary: Diffuse low attenuation throughout the hepatic parenchyma, indicative of a background of hepatic steatosis. No discrete cystic or  solid hepatic lesions. No intra or extrahepatic biliary ductal dilatation. 9 mm calcified gallstone lying dependently in the gallbladder. Pancreas: Postoperative changes of distal pancreatectomy are noted. 1.4 x 1.1 cm low-attenuation lesion in the uncinate process (axial image 30 of series 2), similar to prior studies. No other new pancreatic lesions are noted. No peripancreatic fluid collections or inflammatory changes. Spleen: Status post splenectomy. Adrenals/Urinary Tract: Bilateral kidneys and bilateral adrenal glands are normal in appearance. No hydroureteronephrosis. Urinary bladder is unremarkable in appearance. Stomach/Bowel: The appearance of the stomach is normal. There is no pathologic dilatation of small bowel or colon. Numerous colonic diverticula are noted, without surrounding inflammatory changes to indicate an acute diverticulitis at this time. The appendix is not confidently identified and may be surgically absent. Regardless, there are no inflammatory changes noted adjacent to the cecum to suggest the presence of an acute appendicitis at this time. Vascular/Lymphatic: Aortic atherosclerosis, without evidence of aneurysm or dissection in the abdominal or pelvic vasculature. No lymphadenopathy noted in the abdomen or pelvis. Reproductive: Prostate gland and seminal vesicles are unremarkable in appearance. Other: Small ventral hernias in the epigastric region in the midline and slightly to the left of midline containing only omental fat. No significant volume of ascites. No pneumoperitoneum. Musculoskeletal: There are no aggressive appearing lytic or blastic lesions noted in the visualized portions of the skeleton. IMPRESSION: 1. Status post distal pancreatectomy and splenectomy. No findings to suggest locally recurrent disease or definite metastatic disease in the abdomen or pelvis. 2. 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 Continued attention on routine follow-up imaging is recommended. 3. Cholelithiasis without evidence of acute cholecystitis. 4. Colonic diverticulosis without evidence of acute diverticulitis at this time. 5. Small ventral hernias containing only omental fat. No associated bowel incarceration or obstruction at this time. Hepatic steatosis. 6. Aortic atherosclerosis, in addition to at least right coronary artery disease. Please note that although the presence of coronary artery calcium documents the presence of coronary artery disease, the severity of this disease and any potential stenosis cannot be assessed on this non-gated CT examination. Assessment for potential risk factor modification, dietary therapy or pharmacologic therapy may be warranted, if clinically indicated. 7. Additional incidental findings, as above. Electronically Signed   By: Trudie Reed M.D.   On: 12/22/2022 10:33

## 2022-12-26 ENCOUNTER — Other Ambulatory Visit: Payer: Self-pay | Admitting: *Deleted

## 2022-12-26 ENCOUNTER — Inpatient Hospital Stay (HOSPITAL_BASED_OUTPATIENT_CLINIC_OR_DEPARTMENT_OTHER): Payer: PPO | Admitting: Hematology

## 2022-12-26 VITALS — BP 137/67 | HR 67 | Temp 98.1°F | Resp 156 | Wt 218.4 lb

## 2022-12-26 DIAGNOSIS — C259 Malignant neoplasm of pancreas, unspecified: Secondary | ICD-10-CM | POA: Diagnosis not present

## 2022-12-26 DIAGNOSIS — Z8507 Personal history of malignant neoplasm of pancreas: Secondary | ICD-10-CM | POA: Diagnosis not present

## 2022-12-26 NOTE — Patient Instructions (Signed)
[facility] at [facility] Discharge Instructions   You were seen and examined today by Dr. Katragadda.  He reviewed the results of your CT scan which was normal.   He reviewed the results of your lab work which are normal/stable.   Return as scheduled.    Thank you for choosing [facility] at [facility] to provide your oncology and hematology care.  To afford each patient quality time with our provider, please arrive at least 15 minutes before your scheduled appointment time.   If you have a lab appointment with the Cancer Center please come in thru the Main Entrance and check in at the main information desk.  You need to re-schedule your appointment should you arrive 10 or more minutes late.  We strive to give you quality time with our providers, and arriving late affects you and other patients whose appointments are after yours.  Also, if you no show three or more times for appointments you may be dismissed from the clinic at the providers discretion.     Again, thank you for choosing [facility].  Our hope is that these requests will decrease the amount of time that you wait before being seen by our physicians.       _____________________________________________________________  Should you have questions after your visit to [facility], please contact our office at [phone] and follow the prompts.  Our office hours are 8:00 a.m. and 4:30 p.m. Monday - Friday.  Please note that voicemails left after 4:00 p.m. may not be returned until the following business day.  We are closed weekends and major holidays.  You do have access to a nurse 24-7, just call the main number to the clinic [phone] and do not press any options, hold on the line and a nurse will answer the phone.    For prescription refill requests, have your pharmacy contact our office and allow 72 hours.    Due to Covid, you will need to wear a mask  upon entering the hospital. If you do not have a mask, a mask will be given to you at the Main Entrance upon arrival. For doctor visits, patients may have 1 support person age 18 or older with them. For treatment visits, patients can not have anyone with them due to social distancing guidelines and our immunocompromised population.      

## 2022-12-27 NOTE — Telephone Encounter (Signed)
Called HTA to check the status- it is still pending.  

## 2023-01-01 NOTE — Telephone Encounter (Signed)
HTA Berkley Harvey: [id number] (Exp. 12/19/22 to 03/19/23)

## 2023-01-03 DIAGNOSIS — K429 Umbilical hernia without obstruction or gangrene: Secondary | ICD-10-CM | POA: Diagnosis not present

## 2023-01-09 ENCOUNTER — Encounter: Payer: Self-pay | Admitting: *Deleted

## 2023-01-10 ENCOUNTER — Telehealth: Payer: Self-pay | Admitting: Neurology

## 2023-01-10 NOTE — Telephone Encounter (Signed)
[employer]- HTA auth: [id number] (exp. 12/29/22 to 03/19/23)  Sent mychart message.

## 2023-01-18 DIAGNOSIS — Z125 Encounter for screening for malignant neoplasm of prostate: Secondary | ICD-10-CM | POA: Diagnosis not present

## 2023-01-18 DIAGNOSIS — I10 Essential (primary) hypertension: Secondary | ICD-10-CM | POA: Diagnosis not present

## 2023-01-18 DIAGNOSIS — E1165 Type 2 diabetes mellitus with hyperglycemia: Secondary | ICD-10-CM | POA: Diagnosis not present

## 2023-01-18 DIAGNOSIS — E039 Hypothyroidism, unspecified: Secondary | ICD-10-CM | POA: Diagnosis not present

## 2023-01-23 NOTE — Telephone Encounter (Signed)
I called the patient and left him a voicemail to call back to schedule SS.

## 2023-01-24 DIAGNOSIS — M4802 Spinal stenosis, cervical region: Secondary | ICD-10-CM | POA: Diagnosis not present

## 2023-01-24 DIAGNOSIS — N401 Enlarged prostate with lower urinary tract symptoms: Secondary | ICD-10-CM | POA: Diagnosis not present

## 2023-01-24 DIAGNOSIS — I10 Essential (primary) hypertension: Secondary | ICD-10-CM | POA: Diagnosis not present

## 2023-01-24 DIAGNOSIS — E1165 Type 2 diabetes mellitus with hyperglycemia: Secondary | ICD-10-CM | POA: Diagnosis not present

## 2023-01-24 DIAGNOSIS — M545 Low back pain, unspecified: Secondary | ICD-10-CM | POA: Diagnosis not present

## 2023-01-24 DIAGNOSIS — K219 Gastro-esophageal reflux disease without esophagitis: Secondary | ICD-10-CM | POA: Diagnosis not present

## 2023-01-24 DIAGNOSIS — E039 Hypothyroidism, unspecified: Secondary | ICD-10-CM | POA: Diagnosis not present

## 2023-01-24 DIAGNOSIS — G40309 Generalized idiopathic epilepsy and epileptic syndromes, not intractable, without status epilepticus: Secondary | ICD-10-CM | POA: Diagnosis not present

## 2023-01-24 DIAGNOSIS — Z23 Encounter for immunization: Secondary | ICD-10-CM | POA: Diagnosis not present

## 2023-01-24 DIAGNOSIS — Z0001 Encounter for general adult medical examination with abnormal findings: Secondary | ICD-10-CM | POA: Diagnosis not present

## 2023-01-24 DIAGNOSIS — K429 Umbilical hernia without obstruction or gangrene: Secondary | ICD-10-CM | POA: Diagnosis not present

## 2023-01-24 DIAGNOSIS — I2510 Atherosclerotic heart disease of native coronary artery without angina pectoris: Secondary | ICD-10-CM | POA: Diagnosis not present

## 2023-02-01 ENCOUNTER — Encounter: Payer: Self-pay | Admitting: *Deleted

## 2023-02-15 NOTE — Telephone Encounter (Signed)
[employer]- HTA auth: [id number] (exp. 12/19/22 to 03/19/23)   Patient is scheduled at [facility] for 03/11/23 at 9 pm  Mailed packet to the patient as well.

## 2023-03-13 DIAGNOSIS — M79672 Pain in left foot: Secondary | ICD-10-CM | POA: Diagnosis not present

## 2023-03-13 DIAGNOSIS — M84375A Stress fracture, left foot, initial encounter for fracture: Secondary | ICD-10-CM | POA: Diagnosis not present

## 2023-03-21 DIAGNOSIS — K430 Incisional hernia with obstruction, without gangrene: Secondary | ICD-10-CM | POA: Diagnosis not present

## 2023-04-03 DIAGNOSIS — M84375A Stress fracture, left foot, initial encounter for fracture: Secondary | ICD-10-CM | POA: Diagnosis not present

## 2023-04-03 DIAGNOSIS — M79672 Pain in left foot: Secondary | ICD-10-CM | POA: Diagnosis not present

## 2023-04-09 NOTE — Telephone Encounter (Signed)
The previous HTA auth for the SS has expired.  Started new case for the patient appt for 05/14/23

## 2023-04-10 NOTE — Telephone Encounter (Signed)
Updated insurance auth:  [employer]- HTA auth: [id number] (exp. 04/09/23 to 07/08/23)   Patient is scheduled at [facility] for 05/14/23.

## 2023-04-25 ENCOUNTER — Encounter: Payer: Self-pay | Admitting: Adult Health

## 2023-04-25 ENCOUNTER — Ambulatory Visit: Payer: PPO | Admitting: Adult Health

## 2023-04-25 VITALS — BP 113/60 | HR 75 | Ht 70.0 in | Wt 220.0 lb

## 2023-04-25 DIAGNOSIS — R569 Unspecified convulsions: Secondary | ICD-10-CM

## 2023-04-25 DIAGNOSIS — Z9189 Other specified personal risk factors, not elsewhere classified: Secondary | ICD-10-CM | POA: Diagnosis not present

## 2023-04-25 DIAGNOSIS — G629 Polyneuropathy, unspecified: Secondary | ICD-10-CM | POA: Diagnosis not present

## 2023-04-25 DIAGNOSIS — Z8673 Personal history of transient ischemic attack (TIA), and cerebral infarction without residual deficits: Secondary | ICD-10-CM

## 2023-04-25 MED ORDER — GABAPENTIN 300 MG PO CAPS: 300.0000 mg | ORAL_CAPSULE | Freq: Three times a day (TID) | ORAL | 3 refills | Status: AC

## 2023-04-25 NOTE — Patient Instructions (Signed)
Continue gabapentin 300mg  three times daily for neuropathy   Continue aspirin 81 mg daily  and atorvastatin  for secondary stroke prevention  Proceed with sleep study as scheduled next month  Continue to follow up with PCP regarding cholesterol, diabetes and blood pressure management  Maintain strict control of hypertension with blood pressure goal below 130/90, diabetes with hemoglobin A1c goal below 7.0 % and cholesterol with LDL cholesterol (bad cholesterol) goal below 70 mg/dL.   Signs of a Stroke? Follow the BEFAST method:  Balance Watch for a sudden loss of balance, trouble with coordination or vertigo Eyes Is there a sudden loss of vision in one or both eyes? Or double vision?  Face: Ask the person to smile. Does one side of the face droop or is it numb?  Arms: Ask the person to raise both arms. Does one arm drift downward? Is there weakness or numbness of a leg? Speech: Ask the person to repeat a simple phrase. Does the speech sound slurred/strange? Is the person confused ? Time: If you observe any of these signs, call 911.     Followup in the future with me in 1 year or call earlier if needed       Thank you for coming to see [place] at [facility]. I hope we have been able to provide you high quality care today.  You may receive a patient satisfaction survey over the next few weeks. We would appreciate your feedback and comments so that we may continue to improve ourselves and the health of our patients.
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
## 2023-05-03 ENCOUNTER — Telehealth: Payer: Self-pay | Admitting: Internal Medicine

## 2023-05-03 NOTE — Telephone Encounter (Signed)
Received colonoscopy questionnaire in the mail, left a message asking pt to call back to schedule OV before procedure.... blood in stool was checked.  He also put his actual insurance card in the envelope and I let him know that we had it.

## 2023-05-06 ENCOUNTER — Emergency Department (HOSPITAL_COMMUNITY): Payer: PPO

## 2023-05-06 ENCOUNTER — Other Ambulatory Visit: Payer: Self-pay

## 2023-05-06 ENCOUNTER — Encounter (HOSPITAL_COMMUNITY): Payer: Self-pay

## 2023-05-06 ENCOUNTER — Inpatient Hospital Stay (HOSPITAL_COMMUNITY)
Admission: EM | Admit: 2023-05-06 | Discharge: 2023-05-15 | DRG: 871 | Disposition: A | Discharge status: Home / Self Care | Payer: PPO | Attending: Internal Medicine | Admitting: Internal Medicine

## 2023-05-06 DIAGNOSIS — K5750 Diverticulosis of both small and large intestine without perforation or abscess without bleeding: Secondary | ICD-10-CM | POA: Diagnosis not present

## 2023-05-06 DIAGNOSIS — Z7982 Long term (current) use of aspirin: Secondary | ICD-10-CM

## 2023-05-06 DIAGNOSIS — Z8249 Family history of ischemic heart disease and other diseases of the circulatory system: Secondary | ICD-10-CM

## 2023-05-06 DIAGNOSIS — E119 Type 2 diabetes mellitus without complications: Secondary | ICD-10-CM

## 2023-05-06 DIAGNOSIS — Z951 Presence of aortocoronary bypass graft: Secondary | ICD-10-CM

## 2023-05-06 DIAGNOSIS — Z8507 Personal history of malignant neoplasm of pancreas: Secondary | ICD-10-CM

## 2023-05-06 DIAGNOSIS — Z79890 Hormone replacement therapy (postmenopausal): Secondary | ICD-10-CM

## 2023-05-06 DIAGNOSIS — E669 Obesity, unspecified: Secondary | ICD-10-CM | POA: Diagnosis not present

## 2023-05-06 DIAGNOSIS — A419 Sepsis, unspecified organism: Secondary | ICD-10-CM | POA: Diagnosis not present

## 2023-05-06 DIAGNOSIS — F17210 Nicotine dependence, cigarettes, uncomplicated: Secondary | ICD-10-CM | POA: Diagnosis not present

## 2023-05-06 DIAGNOSIS — R918 Other nonspecific abnormal finding of lung field: Secondary | ICD-10-CM | POA: Diagnosis not present

## 2023-05-06 DIAGNOSIS — R5081 Fever presenting with conditions classified elsewhere: Secondary | ICD-10-CM | POA: Diagnosis present

## 2023-05-06 DIAGNOSIS — Z800 Family history of malignant neoplasm of digestive organs: Secondary | ICD-10-CM

## 2023-05-06 DIAGNOSIS — E039 Hypothyroidism, unspecified: Secondary | ICD-10-CM | POA: Diagnosis present

## 2023-05-06 DIAGNOSIS — I10 Essential (primary) hypertension: Secondary | ICD-10-CM | POA: Diagnosis not present

## 2023-05-06 DIAGNOSIS — R0902 Hypoxemia: Secondary | ICD-10-CM | POA: Diagnosis not present

## 2023-05-06 DIAGNOSIS — D61818 Other pancytopenia: Secondary | ICD-10-CM | POA: Diagnosis present

## 2023-05-06 DIAGNOSIS — M25552 Pain in left hip: Secondary | ICD-10-CM | POA: Diagnosis not present

## 2023-05-06 DIAGNOSIS — R509 Fever, unspecified: Secondary | ICD-10-CM | POA: Diagnosis not present

## 2023-05-06 DIAGNOSIS — E1165 Type 2 diabetes mellitus with hyperglycemia: Secondary | ICD-10-CM | POA: Diagnosis present

## 2023-05-06 DIAGNOSIS — D709 Neutropenia, unspecified: Secondary | ICD-10-CM | POA: Diagnosis not present

## 2023-05-06 DIAGNOSIS — I2510 Atherosclerotic heart disease of native coronary artery without angina pectoris: Secondary | ICD-10-CM | POA: Diagnosis not present

## 2023-05-06 DIAGNOSIS — Z8673 Personal history of transient ischemic attack (TIA), and cerebral infarction without residual deficits: Secondary | ICD-10-CM

## 2023-05-06 DIAGNOSIS — E1140 Type 2 diabetes mellitus with diabetic neuropathy, unspecified: Secondary | ICD-10-CM | POA: Diagnosis not present

## 2023-05-06 DIAGNOSIS — Z1152 Encounter for screening for COVID-19: Secondary | ICD-10-CM | POA: Diagnosis not present

## 2023-05-06 DIAGNOSIS — C259 Malignant neoplasm of pancreas, unspecified: Secondary | ICD-10-CM | POA: Diagnosis not present

## 2023-05-06 DIAGNOSIS — D696 Thrombocytopenia, unspecified: Secondary | ICD-10-CM | POA: Diagnosis not present

## 2023-05-06 DIAGNOSIS — Z794 Long term (current) use of insulin: Secondary | ICD-10-CM

## 2023-05-06 DIAGNOSIS — J189 Pneumonia, unspecified organism: Secondary | ICD-10-CM | POA: Diagnosis present

## 2023-05-06 DIAGNOSIS — E782 Mixed hyperlipidemia: Secondary | ICD-10-CM | POA: Diagnosis not present

## 2023-05-06 DIAGNOSIS — Z823 Family history of stroke: Secondary | ICD-10-CM

## 2023-05-06 DIAGNOSIS — R0602 Shortness of breath: Secondary | ICD-10-CM | POA: Diagnosis not present

## 2023-05-06 DIAGNOSIS — Z6831 Body mass index (BMI) 31.0-31.9, adult: Secondary | ICD-10-CM | POA: Diagnosis not present

## 2023-05-06 DIAGNOSIS — K8020 Calculus of gallbladder without cholecystitis without obstruction: Secondary | ICD-10-CM | POA: Diagnosis not present

## 2023-05-06 DIAGNOSIS — Z720 Tobacco use: Secondary | ICD-10-CM | POA: Diagnosis present

## 2023-05-06 DIAGNOSIS — J90 Pleural effusion, not elsewhere classified: Secondary | ICD-10-CM | POA: Diagnosis not present

## 2023-05-06 DIAGNOSIS — Z79899 Other long term (current) drug therapy: Secondary | ICD-10-CM

## 2023-05-06 DIAGNOSIS — Z885 Allergy status to narcotic agent status: Secondary | ICD-10-CM

## 2023-05-06 DIAGNOSIS — M5136 Other intervertebral disc degeneration, lumbar region: Secondary | ICD-10-CM | POA: Diagnosis not present

## 2023-05-06 LAB — CBC WITH DIFFERENTIAL/PLATELET
Abs Immature Granulocytes: 0 10*3/uL (ref 0.00–0.07)
Basophils Absolute: 0 10*3/uL (ref 0.0–0.1)
Basophils Relative: 0 %
Eosinophils Absolute: 0 10*3/uL (ref 0.0–0.5)
Eosinophils Relative: 1 %
HCT: 33.7 % — ABNORMAL LOW (ref 39.0–52.0)
Hemoglobin: 12.1 g/dL — ABNORMAL LOW (ref 13.0–17.0)
Immature Granulocytes: 0 %
Lymphocytes Relative: 50 %
Lymphs Abs: 0.5 10*3/uL — ABNORMAL LOW (ref 0.7–4.0)
MCH: 36.8 pg — ABNORMAL HIGH (ref 26.0–34.0)
MCHC: 35.9 g/dL (ref 30.0–36.0)
MCV: 102.4 fL — ABNORMAL HIGH (ref 80.0–100.0)
Monocytes Absolute: 0 10*3/uL — ABNORMAL LOW (ref 0.1–1.0)
Monocytes Relative: 2 %
Neutro Abs: 0.4 10*3/uL — CL (ref 1.7–7.7)
Neutrophils Relative %: 47 %
Platelets: 58 10*3/uL — ABNORMAL LOW (ref 150–400)
RBC: 3.29 MIL/uL — ABNORMAL LOW (ref 4.22–5.81)
RDW: 16.6 % — ABNORMAL HIGH (ref 11.5–15.5)
WBC: 0.9 10*3/uL — CL (ref 4.0–10.5)
nRBC: 0 % (ref 0.0–0.2)

## 2023-05-06 LAB — COMPREHENSIVE METABOLIC PANEL
ALT: 33 U/L (ref 0–44)
AST: 28 U/L (ref 15–41)
Albumin: 3.7 g/dL (ref 3.5–5.0)
Alkaline Phosphatase: 52 U/L (ref 38–126)
Anion gap: 9 (ref 5–15)
BUN: 17 mg/dL (ref 8–23)
CO2: 24 mmol/L (ref 22–32)
Calcium: 8.2 mg/dL — ABNORMAL LOW (ref 8.9–10.3)
Chloride: 99 mmol/L (ref 98–111)
Creatinine, Ser: 0.8 mg/dL (ref 0.61–1.24)
GFR, Estimated: >60 mL/min (ref >60)
Glucose, Bld: 241 mg/dL — ABNORMAL HIGH (ref 70–99)
Potassium: 4.1 mmol/L (ref 3.5–5.1)
Sodium: 132 mmol/L — ABNORMAL LOW (ref 135–145)
Total Bilirubin: 1.3 mg/dL — ABNORMAL HIGH (ref 0.3–1.2)
Total Protein: 6.6 g/dL (ref 6.5–8.1)

## 2023-05-06 LAB — URINALYSIS, ROUTINE W REFLEX MICROSCOPIC
Bacteria, UA: NONE SEEN
Bilirubin Urine: NEGATIVE
Glucose, UA: >=500 mg/dL — AB
Hgb urine dipstick: NEGATIVE
Ketones, ur: NEGATIVE mg/dL
Leukocytes,Ua: NEGATIVE
Nitrite: NEGATIVE
Protein, ur: NEGATIVE mg/dL
Specific Gravity, Urine: 1.016 (ref 1.005–1.030)
pH: 5 (ref 5.0–8.0)

## 2023-05-06 LAB — RESP PANEL BY RT-PCR (RSV, FLU A&B, COVID)  RVPGX2
Influenza A by PCR: NEGATIVE
Influenza B by PCR: NEGATIVE
Resp Syncytial Virus by PCR: NEGATIVE
SARS Coronavirus 2 by RT PCR: NEGATIVE

## 2023-05-06 LAB — LACTIC ACID, PLASMA: Lactic Acid, Venous: 1.1 mmol/L (ref 0.5–1.9)

## 2023-05-06 LAB — MAGNESIUM: Magnesium: 1.8 mg/dL (ref 1.7–2.4)

## 2023-05-06 LAB — CBG MONITORING, ED: Glucose-Capillary: 278 mg/dL — ABNORMAL HIGH (ref 70–99)

## 2023-05-06 LAB — TSH: TSH: 2.227 u[IU]/mL (ref 0.350–4.500)

## 2023-05-06 LAB — GLUCOSE, CAPILLARY
Glucose-Capillary: 251 mg/dL — ABNORMAL HIGH (ref 70–99)
Glucose-Capillary: 241 mg/dL — ABNORMAL HIGH (ref 70–99)

## 2023-05-06 LAB — HEMOGLOBIN A1C
Hgb A1c MFr Bld: 9.4 % — ABNORMAL HIGH (ref 4.8–5.6)
Mean Plasma Glucose: 223.08 mg/dL

## 2023-05-06 LAB — PHOSPHORUS: Phosphorus: 2.8 mg/dL (ref 2.5–4.6)

## 2023-05-06 MED ORDER — SODIUM CHLORIDE 0.9 % IV SOLN
2.0000 g | Freq: Once | INTRAVENOUS | Status: AC
  Administered 2023-05-06: 2 g via INTRAVENOUS
  Filled 2023-05-06: qty 20

## 2023-05-06 MED ORDER — SODIUM CHLORIDE 0.9 % IV SOLN
2.0000 g | Freq: Three times a day (TID) | INTRAVENOUS | Status: DC
  Administered 2023-05-06 – 2023-05-11 (×14): 2 g via INTRAVENOUS
  Filled 2023-05-06 (×14): qty 12.5

## 2023-05-06 MED ORDER — ASPIRIN 81 MG PO TBEC
81.0000 mg | DELAYED_RELEASE_TABLET | Freq: Every day | ORAL | Status: DC
  Administered 2023-05-07 – 2023-05-15 (×9): 81 mg via ORAL
  Filled 2023-05-06 (×9): qty 1

## 2023-05-06 MED ORDER — BUDESONIDE 0.5 MG/2ML IN SUSP
0.5000 mg | Freq: Two times a day (BID) | RESPIRATORY_TRACT | Status: DC
  Administered 2023-05-06 – 2023-05-13 (×12): 0.5 mg via RESPIRATORY_TRACT
  Filled 2023-05-06 (×17): qty 2

## 2023-05-06 MED ORDER — SODIUM CHLORIDE 0.9 % IV SOLN
500.0000 mg | Freq: Once | INTRAVENOUS | Status: AC
  Administered 2023-05-06: 500 mg via INTRAVENOUS
  Filled 2023-05-06: qty 5

## 2023-05-06 MED ORDER — METHOCARBAMOL 500 MG PO TABS
500.0000 mg | ORAL_TABLET | Freq: Three times a day (TID) | ORAL | Status: DC | PRN
  Administered 2023-05-06 – 2023-05-07 (×2): 500 mg via ORAL
  Filled 2023-05-06 (×2): qty 1

## 2023-05-06 MED ORDER — SODIUM CHLORIDE 0.9 % IV SOLN
500.0000 mg | INTRAVENOUS | Status: DC
  Administered 2023-05-07 – 2023-05-10 (×4): 500 mg via INTRAVENOUS
  Filled 2023-05-06 (×4): qty 5

## 2023-05-06 MED ORDER — INSULIN GLARGINE-YFGN 100 UNIT/ML ~~LOC~~ SOLN
40.0000 [IU] | Freq: Every day | SUBCUTANEOUS | Status: DC
  Administered 2023-05-06 – 2023-05-07 (×2): 40 [IU] via SUBCUTANEOUS
  Filled 2023-05-06 (×3): qty 0.4

## 2023-05-06 MED ORDER — ZINC SULFATE 220 (50 ZN) MG PO CAPS
220.0000 mg | ORAL_CAPSULE | Freq: Every day | ORAL | Status: DC
  Administered 2023-05-06 – 2023-05-15 (×10): 220 mg via ORAL
  Filled 2023-05-06 (×10): qty 1

## 2023-05-06 MED ORDER — SODIUM CHLORIDE 0.9 % IV SOLN: INTRAVENOUS | Status: AC

## 2023-05-06 MED ORDER — VITAMIN D 25 MCG (1000 UNIT) PO TABS
1000.0000 [IU] | ORAL_TABLET | Freq: Every day | ORAL | Status: DC
  Administered 2023-05-07 – 2023-05-15 (×9): 1000 [IU] via ORAL
  Filled 2023-05-06 (×9): qty 1

## 2023-05-06 MED ORDER — TBO-FILGRASTIM 480 MCG/0.8ML ~~LOC~~ SOSY
480.0000 ug | PREFILLED_SYRINGE | Freq: Once | SUBCUTANEOUS | Status: AC
  Administered 2023-05-06: 480 ug via SUBCUTANEOUS
  Filled 2023-05-06: qty 0.8

## 2023-05-06 MED ORDER — EZETIMIBE 10 MG PO TABS
10.0000 mg | ORAL_TABLET | Freq: Every day | ORAL | Status: DC
  Administered 2023-05-07 – 2023-05-15 (×9): 10 mg via ORAL
  Filled 2023-05-06 (×9): qty 1

## 2023-05-06 MED ORDER — TAMSULOSIN HCL 0.4 MG PO CAPS
0.4000 mg | ORAL_CAPSULE | Freq: Every day | ORAL | Status: DC
  Administered 2023-05-06 – 2023-05-14 (×9): 0.4 mg via ORAL
  Filled 2023-05-06 (×9): qty 1

## 2023-05-06 MED ORDER — SODIUM CHLORIDE 0.9 % IV SOLN: 2.0000 g | INTRAVENOUS | Status: DC

## 2023-05-06 MED ORDER — LEVOTHYROXINE SODIUM 50 MCG PO TABS
50.0000 ug | ORAL_TABLET | Freq: Every day | ORAL | Status: DC
  Administered 2023-05-07 – 2023-05-15 (×9): 50 ug via ORAL
  Filled 2023-05-06 (×9): qty 1

## 2023-05-06 MED ORDER — ATORVASTATIN CALCIUM 40 MG PO TABS
80.0000 mg | ORAL_TABLET | Freq: Every day | ORAL | Status: DC
  Administered 2023-05-07 – 2023-05-15 (×9): 80 mg via ORAL
  Filled 2023-05-06 (×9): qty 2

## 2023-05-06 MED ORDER — INSULIN ASPART 100 UNIT/ML IJ SOLN
0.0000 [IU] | Freq: Three times a day (TID) | INTRAMUSCULAR | Status: DC
  Administered 2023-05-06: 3 [IU] via SUBCUTANEOUS
  Administered 2023-05-06 – 2023-05-07 (×2): 5 [IU] via SUBCUTANEOUS
  Administered 2023-05-07: 1 [IU] via SUBCUTANEOUS
  Administered 2023-05-07: 3 [IU] via SUBCUTANEOUS
  Administered 2023-05-08: 2 [IU] via SUBCUTANEOUS
  Administered 2023-05-08: 5 [IU] via SUBCUTANEOUS
  Administered 2023-05-08: 7 [IU] via SUBCUTANEOUS
  Administered 2023-05-09: 5 [IU] via SUBCUTANEOUS
  Administered 2023-05-09: 7 [IU] via SUBCUTANEOUS
  Administered 2023-05-09: 2 [IU] via SUBCUTANEOUS
  Administered 2023-05-10: 7 [IU] via SUBCUTANEOUS
  Administered 2023-05-10: 2 [IU] via SUBCUTANEOUS
  Administered 2023-05-10: 3 [IU] via SUBCUTANEOUS
  Administered 2023-05-11: 5 [IU] via SUBCUTANEOUS
  Administered 2023-05-11: 2 [IU] via SUBCUTANEOUS
  Administered 2023-05-11 – 2023-05-12 (×2): 5 [IU] via SUBCUTANEOUS
  Administered 2023-05-12 (×2): 7 [IU] via SUBCUTANEOUS
  Administered 2023-05-13: 2 [IU] via SUBCUTANEOUS
  Administered 2023-05-13 – 2023-05-14 (×3): 3 [IU] via SUBCUTANEOUS
  Administered 2023-05-14 – 2023-05-15 (×3): 2 [IU] via SUBCUTANEOUS
  Filled 2023-05-06: qty 1

## 2023-05-06 MED ORDER — IPRATROPIUM-ALBUTEROL 0.5-2.5 (3) MG/3ML IN SOLN: 3.0000 mL | Freq: Four times a day (QID) | RESPIRATORY_TRACT | Status: DC | PRN

## 2023-05-06 MED ORDER — METOPROLOL SUCCINATE ER 25 MG PO TB24
25.0000 mg | ORAL_TABLET | Freq: Every day | ORAL | Status: DC
  Administered 2023-05-06 – 2023-05-15 (×10): 25 mg via ORAL
  Filled 2023-05-06 (×10): qty 1

## 2023-05-06 MED ORDER — INSULIN ASPART 100 UNIT/ML IJ SOLN
0.0000 [IU] | Freq: Every day | INTRAMUSCULAR | Status: DC
  Administered 2023-05-06: 2 [IU] via SUBCUTANEOUS
  Administered 2023-05-07: 3 [IU] via SUBCUTANEOUS
  Administered 2023-05-08: 4 [IU] via SUBCUTANEOUS
  Administered 2023-05-09: 2 [IU] via SUBCUTANEOUS
  Administered 2023-05-10 – 2023-05-11 (×2): 4 [IU] via SUBCUTANEOUS
  Administered 2023-05-12: 2 [IU] via SUBCUTANEOUS
  Administered 2023-05-13 – 2023-05-14 (×2): 3 [IU] via SUBCUTANEOUS

## 2023-05-06 MED ORDER — ONDANSETRON HCL 4 MG PO TABS: 4.0000 mg | ORAL_TABLET | Freq: Four times a day (QID) | ORAL | Status: DC | PRN

## 2023-05-06 MED ORDER — ACETAMINOPHEN 650 MG RE SUPP: 650.0000 mg | Freq: Four times a day (QID) | RECTAL | Status: DC | PRN

## 2023-05-06 MED ORDER — VITAMIN B-12 1000 MCG PO TABS
1000.0000 ug | ORAL_TABLET | Freq: Every day | ORAL | Status: DC
  Administered 2023-05-07 – 2023-05-15 (×9): 1000 ug via ORAL
  Filled 2023-05-06 (×9): qty 1

## 2023-05-06 MED ORDER — GABAPENTIN 300 MG PO CAPS
300.0000 mg | ORAL_CAPSULE | Freq: Three times a day (TID) | ORAL | Status: DC
  Administered 2023-05-06 – 2023-05-15 (×26): 300 mg via ORAL
  Filled 2023-05-06 (×28): qty 1

## 2023-05-06 MED ORDER — NICOTINE 14 MG/24HR TD PT24: 14.0000 mg | MEDICATED_PATCH | Freq: Every day | TRANSDERMAL | Status: DC

## 2023-05-06 MED ORDER — ACETAMINOPHEN 325 MG PO TABS
650.0000 mg | ORAL_TABLET | Freq: Four times a day (QID) | ORAL | Status: DC | PRN
  Administered 2023-05-06 – 2023-05-14 (×22): 650 mg via ORAL
  Filled 2023-05-06 (×23): qty 2

## 2023-05-06 MED ORDER — ONDANSETRON HCL 4 MG/2ML IJ SOLN
4.0000 mg | Freq: Four times a day (QID) | INTRAMUSCULAR | Status: DC | PRN
  Administered 2023-05-10 – 2023-05-12 (×2): 4 mg via INTRAVENOUS
  Filled 2023-05-06 (×2): qty 2

## 2023-05-06 MED ORDER — OXYCODONE HCL 5 MG PO TABS
5.0000 mg | ORAL_TABLET | Freq: Four times a day (QID) | ORAL | Status: DC | PRN
  Administered 2023-05-06 (×2): 5 mg via ORAL
  Filled 2023-05-06 (×3): qty 1

## 2023-05-06 MED ORDER — DM-GUAIFENESIN ER 30-600 MG PO TB12
1.0000 | ORAL_TABLET | Freq: Two times a day (BID) | ORAL | Status: DC
  Administered 2023-05-06 – 2023-05-15 (×18): 1 via ORAL
  Filled 2023-05-06 (×19): qty 1

## 2023-05-06 NOTE — Plan of Care (Signed)
  Problem: Activity: Goal: Ability to tolerate increased activity will improve Outcome: Progressing   

## 2023-05-06 NOTE — H&P (Addendum)
History and Physical    Patient: Mark Foster WJX:[id number] DOB: 1956-02-05 DOA: 05/06/2023 DOS: the patient was seen and examined on 05/06/2023 PCP: Benita Stabile, MD  Patient coming from: Home  Chief Complaint:  Chief Complaint  Patient presents with   Chills   HPI: Mark Foster is a 67 y.o. male with medical history significant of pancreatic cancer, prior history of stroke, hypertension, hyperlipidemia, type 2 diabetes, hypothyroidism and prior hx of tobacco abuse; who presented to the emergency department secondary to mildly productive coughing spells, chills, general malaise and shortness of breath.  Patient reports symptom has been present for approximately 3 days prior to admission and worsening.  Patient expressed no sick contacts, no chest pain, no nausea, no vomiting, no dysuria, no hematuria, no new focal deficits, no melena and no hematochezia.  Patient reported also significant pain in his lower back running down his leg that have been present for the last week or so.  Workup in the ED demonstrated negative COVID PCR, chest x-ray with left lower lobe infiltrate and worsening neutropenia/thrombocytopenia.  Cultures taken, antibiotics started and case discussed with oncology service who has recommended admission to the hospital for IV antibiotics and further management.  [facility] contacted to admit patient for further treatment   Review of Systems: As mentioned in the history of present illness. All other systems reviewed and are negative. Past Medical History:  Diagnosis Date   Bilateral carpal tunnel syndrome 10/19/2020   CAD (coronary artery disease)    a. 01/2020: CABG x4 with LIMA-LAD, SVG-OM, SVG-PDA and SVG-D1   Carotid artery obstruction, left    Left ICA occlusion   Cervical compression fracture (HCC)    Cervical spinal stenosis    Diabetic neuropathy (HCC)    Bilateral legs   Diverticulitis    Family history of pancreatic cancer    History of kidney stones     Hypothyroidism    Pancreatic cancer (HCC)    Stenosis of left vertebral artery    Stroke (cerebrum) (HCC) 10/09/2019   Type 2 diabetes mellitus (HCC)    Past Surgical History:  Procedure Laterality Date   APPENDECTOMY     BUBBLE STUDY N/A 11/18/2019   Procedure: BUBBLE STUDY;  Surgeon: Laqueta Linden, MD;  Location: [facility];  Service: Cardiology;  Laterality: N/A;   CARDIAC SURGERY     COLON RESECTION     For diverticulitis   COLON SURGERY     CORONARY ARTERY BYPASS GRAFT N/A 01/26/2020   Procedure: CORONARY ARTERY BYPASS GRAFTING (CABG) TIMES FOUR USING LEFT INTERNAL MAMMARY VEIN AND RIGHT GREATER SAPHENOUS VEIN;  Surgeon: Loreli Slot, MD;  Location: [facility] OR;  Service: Open Heart Surgery;  Laterality: N/A;   ENDOVEIN HARVEST OF GREATER SAPHENOUS VEIN Right 01/26/2020   Procedure: Mack Guise Of Greater Saphenous Vein;  Surgeon: Loreli Slot, MD;  Location: [facility] OR;  Service: Open Heart Surgery;  Laterality: Right;   ESOPHAGOGASTRODUODENOSCOPY N/A 10/03/2018   Procedure: ESOPHAGOGASTRODUODENOSCOPY (EGD);  Surgeon: Rachael Fee, MD;  Location: [facility];  Service: Endoscopy;  Laterality: N/A;   EUS N/A 10/03/2018   Procedure: UPPER ENDOSCOPIC ULTRASOUND (EUS) RADIAL;  Surgeon: Rachael Fee, MD;  Location: [facility];  Service: Endoscopy;  Laterality: N/A;   EYE SURGERY Bilateral    FINE NEEDLE ASPIRATION N/A 10/03/2018   Procedure: FINE NEEDLE ASPIRATION (FNA) LINEAR;  Surgeon: Rachael Fee, MD;  Location: [facility];  Service: Endoscopy;  Laterality: N/A;   IR ANGIO INTRA  EXTRACRAN SEL COM CAROTID INNOMINATE BILAT MOD SED  01/21/2018   IR ANGIO INTRA EXTRACRAN SEL COM CAROTID INNOMINATE UNI R MOD SED  03/21/2018   IR ANGIO VERTEBRAL SEL VERTEBRAL BILAT MOD SED  01/21/2018   IR TRANSCATH EXCRAN VERT OR CAR A STENT  03/21/2018   LEFT HEART CATH AND CORONARY ANGIOGRAPHY N/A 01/08/2020   Procedure: LEFT HEART CATH AND CORONARY ANGIOGRAPHY;  Surgeon: Marykay Lex, MD;  Location: [facility] INVASIVE CV LAB;  Service: Cardiovascular;  Laterality: N/A;   PORT-A-CATH REMOVAL N/A 06/20/2021   Procedure: MINOR REMOVAL PORT-A-CATH;  Surgeon: Lucretia Roers, MD;  Location: [facility];  Service: General;  Laterality: N/A;   PORTACATH PLACEMENT Left 12/23/2018   Procedure: INSERTION PORT-A-CATH;  Surgeon: Lucretia Roers, MD;  Location: [facility];  Service: General;  Laterality: Left;   RADIOLOGY WITH ANESTHESIA N/A 03/21/2018   Procedure: IR WITH ANESTHESIA WITH STENT PLACEMENT;  Surgeon: Julieanne Cotton, MD;  Location: [facility] OR;  Service: Radiology;  Laterality: N/A;   ROTATOR CUFF REPAIR     Left   SPLENECTOMY     TEE WITHOUT CARDIOVERSION N/A 11/18/2019   Procedure: TRANSESOPHAGEAL ECHOCARDIOGRAM (TEE) WITH PROPOFOL;  Surgeon: Laqueta Linden, MD;  Location: [facility];  Service: Cardiology;  Laterality: N/A;   TEE WITHOUT CARDIOVERSION N/A 01/26/2020   Procedure: TRANSESOPHAGEAL ECHOCARDIOGRAM (TEE);  Surgeon: Loreli Slot, MD;  Location: [facility] OR;  Service: Open Heart Surgery;  Laterality: N/A;   TONSILLECTOMY     Social History:  reports that he has quit smoking. His smoking use included cigarettes. He started smoking about 3 years ago. He has a 0.9 pack-year smoking history. He has never used smokeless tobacco. He reports that he does not currently use alcohol after a past usage of about 1.0 standard drink of alcohol per week. He reports that he does not use drugs.  Allergies  Allergen Reactions   Demerol [Meperidine Hcl] Other (See Comments)    convulsions    Family History  Problem Relation Age of Onset   Stroke Mother    Pancreatic cancer Father 7       d. 75   Stroke Maternal Grandmother    Heart attack Maternal Grandfather    Heart Problems Paternal Grandfather    Scoliosis Daughter    Muscular dystrophy Grandson     Prior to Admission medications   Medication Sig Start Date End Date Taking? Authorizing Provider  aspirin EC 81 MG  tablet Take 1 tablet (81 mg total) by mouth daily with breakfast. 10/31/22   Shon Hale, MD  atorvastatin (LIPITOR) 80 MG tablet Take 1 tablet (80 mg total) by mouth daily at 6 PM. Patient taking differently: Take 80 mg by mouth daily. 05/03/20   Wilson Singer, MD  Cholecalciferol (VITAMIN D3) 25 MCG (1000 UT) capsule Take 1,000 Units by mouth daily.    [provider]  CINNAMON PO Take by mouth.    [provider]  cyanocobalamin (VITAMIN B12) 1000 MCG tablet Take 1 tablet (1,000 mcg total) by mouth daily. 10/31/22   Shon Hale, MD  [facility] COMPLEX PO Take 1,000 mg by mouth daily.    [provider]  ezetimibe (ZETIA) 10 MG tablet Take by mouth. 10/18/21   [provider]  gabapentin (NEURONTIN) 300 MG capsule Take 1 capsule (300 mg total) by mouth 3 (three) times daily. 04/25/23   Ihor Austin, NP  insulin aspart (NOVOLOG FLEXPEN) 100 UNIT/ML FlexPen Inject 14-20 Units into the skin  3 (three) times daily before meals. Patient taking differently: Inject 18-24 Units into the skin 3 (three) times daily before meals. 05/12/21   Roma Kayser, MD  Insulin Pen Needle 32G X 4 MM MISC 1 Device by Does not apply route daily. Use as directed to inject insulin four times daily 10/28/20   Roma Kayser, MD  levothyroxine (SYNTHROID) 50 MCG tablet TAKE 1 TABLET EVERY DAY 08/14/20   Lilly Cove C, MD  metoprolol succinate (TOPROL-XL) 25 MG 24 hr tablet Take 25 mg by mouth daily. 08/11/22   [provider]  Multiple Vitamin (MULTIVITAMIN WITH MINERALS) TABS tablet Take 1 tablet by mouth daily. 11/01/22   Shon Hale, MD  nitroGLYCERIN (NITROSTAT) 0.4 MG SL tablet Place 1 tablet (0.4 mg total) under the tongue every 5 (five) minutes as needed for chest pain. 10/31/21   Jonelle Sidle, MD  Omega-3 1000 MG CAPS Take 1,000 mg by mouth daily.    [provider]  tamsulosin (FLOMAX) 0.4 MG CAPS capsule Take 1 capsule (0.4 mg  total) by mouth daily after supper. 10/31/22   Shon Hale, MD  TOUJEO MAX SOLOSTAR 300 UNIT/ML Solostar Pen INJECT 60 UNITS INTO THE SKIN AT BEDTIME. 08/22/21   Nida, Denman George, MD  zinc sulfate 220 (50 Zn) MG capsule Take 1 capsule (220 mg total) by mouth daily. 11/01/22   Shon Hale, MD    Physical Exam: Vitals:   05/06/23 0757 05/06/23 1144 05/06/23 1346 05/06/23 1448  BP:  (!) 149/89 (!) 151/67 (!) 157/82  Pulse:  86 90 95  Resp:  18 20 20   Temp:  98 F (36.7 C) 98.6 F (37 C) (!) 102.2 F (39 C)  TempSrc:  Oral Oral Axillary  SpO2:  97% 100% 96%  Weight: 99.8 kg     Height: 5\' 10"  (1.778 m)      General exam: Alert, awake, oriented x 3; no chest pain, no nausea or vomiting.  Oriented x 3 and in no acute distress.  Good saturation on room air. Respiratory system: Positive rhonchi (left more than right); no wheezing, no using accessory muscles. Cardiovascular system:RRR. No rubs or gallops; no JVD. Gastrointestinal system: Abdomen is nondistended, soft and nontender. No organomegaly or masses felt. Normal bowel sounds heard. Central nervous system: No new focal neurological deficits. Extremities: No cyanosis or clubbing. Skin: No petechiae. Psychiatry: Judgement and insight appear normal. Mood & affect appropriate.   Data Reviewed: COVID PCR: Negative. CBC: White blood cell 0.9, hemoglobin 12.1, platelet count 58K, absolute neutrophils 0.4 Comprehensive metabolic panel: Sodium 132, potassium 4.1, chloride 99, bicarb 24, glucose 241, BUN 17, creatinine 0.80, normal LFTs and GFR >60 Magnesium: 1.8 Lactic acid 1.1 UA: Demonstrating negative leukocyte esterase, no ketones, no nitrites, more than 500 glucose, clear appearance and normal specific gravity.  Assessment and Plan: * CAP (community acquired pneumonia) -Patient reports 2-3 days of productive cough, short winded sensation, chills and general malaise -No sick contacts -COVID PCR negative -Chest x-ray  demonstrating left lower lobe infiltrate; patient with underlying history of pancreatic cancer and neutropenia. -Case discussed with oncology service; IV antibiotics initiated -Blood culture, urine culture, strep urine antigen, Legionella and influenza PCR has been ordered -Bronchodilator management, mucolytic's and flutter valve has been ordered. -Will follow clinical response.  Pancreatic adenocarcinoma (HCC) -Status post surgical resection -Continue patient follow-up with oncology service. -Currently not receiving chemotherapy management  Tobacco abuse -Complete abstinence and avoidance of second hand smoking discussed -patient himself quit years ago.  Hypothyroidism -Update TSH -Continue Synthroid  Diabetes mellitus type 2 in nonobese (HCC) -A1c will be updated -Follow CBGs fluctuation and adjust hypoglycemic regimen as needed -Sliding scale insulin and Semglee has been ordered  Mixed hyperlipidemia -Continue treatment with Lipitor and Zetia -Heart healthy diet has been encouraged.  Essential hypertension, benign -Continue home antihypertensive agents -Heart healthy diet has been ordered      Advance Care Planning:   Code Status: Full Code   Consults: None  Family Communication: No family at bedside.  Severity of Illness: The appropriate patient status for this patient is OBSERVATION. Observation status is judged to be reasonable and necessary in order to provide the required intensity of service to ensure the patient's safety. The patient's presenting symptoms, physical exam findings, and initial radiographic and laboratory data in the context of their medical condition is felt to place them at decreased risk for further clinical deterioration. Furthermore, it is anticipated that the patient will be medically stable for discharge from the hospital within 2 midnights of admission.   Author: Vassie Loll, MD 05/06/2023 5:55 PM  For on call review [email or web address].

## 2023-05-06 NOTE — Assessment & Plan Note (Signed)
TSH within normal limits.  Continue Synthroid. 

## 2023-05-06 NOTE — Assessment & Plan Note (Signed)
-  Status post surgical resection -Continue patient follow-up with oncology service. -Currently not receiving chemotherapy management

## 2023-05-06 NOTE — Progress Notes (Signed)
MEWS Progress Note  Patient Details Name: Mark Foster MRN: [id number] DOB: 06-22-1956 Today's Date: 05/06/2023   MEWS Flowsheet Documentation:  Assess: MEWS Score Temp: (!) 102.2 F (39 C) BP: (!) 157/82 MAP (mmHg): 97 Pulse Rate: 95 Resp: 20 Level of Consciousness: Alert SpO2: 96 % O2 Device: Room Air Assess: MEWS Score MEWS Temp: 2 MEWS Systolic: 0 MEWS Pulse: 0 MEWS RR: 0 MEWS LOC: 0 MEWS Score: 2 MEWS Score Color: Yellow Assess: SIRS CRITERIA SIRS Temperature : 1 SIRS Respirations : 0 SIRS Pulse: 1 SIRS WBC: 1 SIRS Score Sum : 3 SIRS Temperature : 1 SIRS Pulse: 1 SIRS Respirations : 0 SIRS WBC: 1 SIRS Score Sum : 3 Assess: if the MEWS score is Yellow or Red Were vital signs accurate and taken at a resting state?: Yes Does the patient meet 2 or more of the SIRS criteria?: No MEWS guidelines implemented : Yes, yellow Treat MEWS Interventions: Considered administering scheduled or prn medications/treatments as ordered Take Vital Signs Increase Vital Sign Frequency : Yellow: Q2hr x1, continue Q4hrs until patient remains green for 12hrs Escalate MEWS: Escalate: Yellow: Discuss with charge nurse and consider notifying provider and/or RRT Notify: Charge Nurse/RN Name of Charge Nurse/RN Notified: Sports administrator Notification Provider Name/Title: Dr. Gwenlyn Perking Date Provider Notified: 05/06/23 Time Provider Notified: 1452 Method of Notification: Page Notification Reason: Other (Comment) (yellow MEWS)      Catalina Lunger 05/06/2023, 2:56 PM

## 2023-05-06 NOTE — Assessment & Plan Note (Signed)
-  Cessation counseling provided -Nicotine patch ordered. 

## 2023-05-06 NOTE — ED Notes (Signed)
Informed pt. of need for urine sample; pt declined to provide at this time; MD in the rm stated cath was unnecessary and ok to wait for catch.

## 2023-05-06 NOTE — Assessment & Plan Note (Signed)
-  Patient reports 2-3 days of productive cough, short winded sensation, chills and general malaise. -Found with worsening neutropenia and spiking fever -Continue to follow culture result -Continue the use of cefepime and Zithromax; vancomycin added as well to broaden antibiotics. -Continue to maintain adequate hydration and provide as needed antipyretics -Granix x 1 provided and neutropenia/absolute neutrophil counts improved and above 1000. -COVID PCR negative -Continue mucolytics and as needed bronchodilators. -CT scan of his left lower extremity/pelvis without abnormalities. -Urinalysis, cultures and blood cultures no triggering any other microorganism or source of infection at the moment -Given concern for ongoing fever episodes no driven by pneumonia only infectious disease has been contacted and will follow recommendation as part of further workup needed.

## 2023-05-06 NOTE — ED Provider Notes (Signed)
[place] EMERGENCY DEPARTMENT AT [facility] Provider Note   CSN: [id number] Arrival date & time: 05/06/23  6295     History {Add pertinent medical, surgical, social history, OB history to HPI:1} Chief Complaint  Patient presents with   Chills    Mark Foster is a 67 y.o. male.  Patient has a history of pancreatic cancer.  She complains of chills and cough for few days.   Cough      Home Medications Prior to Admission medications   Medication Sig Start Date End Date Taking? Authorizing Provider  aspirin EC 81 MG tablet Take 1 tablet (81 mg total) by mouth daily with breakfast. 10/31/22   Shon Hale, MD  atorvastatin (LIPITOR) 80 MG tablet Take 1 tablet (80 mg total) by mouth daily at 6 PM. Patient taking differently: Take 80 mg by mouth daily. 05/03/20   Wilson Singer, MD  Cholecalciferol (VITAMIN D3) 25 MCG (1000 UT) capsule Take 1,000 Units by mouth daily.    [provider]  CINNAMON PO Take by mouth.    [provider]  cyanocobalamin (VITAMIN B12) 1000 MCG tablet Take 1 tablet (1,000 mcg total) by mouth daily. 10/31/22   Shon Hale, MD  [facility] COMPLEX PO Take 1,000 mg by mouth daily.    [provider]  ezetimibe (ZETIA) 10 MG tablet Take by mouth. 10/18/21   [provider]  gabapentin (NEURONTIN) 300 MG capsule Take 1 capsule (300 mg total) by mouth 3 (three) times daily. 04/25/23   Ihor Austin, NP  insulin aspart (NOVOLOG FLEXPEN) 100 UNIT/ML FlexPen Inject 14-20 Units into the skin 3 (three) times daily before meals. Patient taking differently: Inject 18-24 Units into the skin 3 (three) times daily before meals. 05/12/21   Roma Kayser, MD  Insulin Pen Needle 32G X 4 MM MISC 1 Device by Does not apply route daily. Use as directed to inject insulin four times daily 10/28/20   Roma Kayser, MD  levothyroxine (SYNTHROID) 50 MCG tablet TAKE 1 TABLET EVERY DAY 08/14/20   Lilly Cove C, MD   metoprolol succinate (TOPROL-XL) 25 MG 24 hr tablet Take 25 mg by mouth daily. 08/11/22   [provider]  Multiple Vitamin (MULTIVITAMIN WITH MINERALS) TABS tablet Take 1 tablet by mouth daily. 11/01/22   Shon Hale, MD  nitroGLYCERIN (NITROSTAT) 0.4 MG SL tablet Place 1 tablet (0.4 mg total) under the tongue every 5 (five) minutes as needed for chest pain. 10/31/21   Jonelle Sidle, MD  Omega-3 1000 MG CAPS Take 1,000 mg by mouth daily.    [provider]  tamsulosin (FLOMAX) 0.4 MG CAPS capsule Take 1 capsule (0.4 mg total) by mouth daily after supper. 10/31/22   Shon Hale, MD  TOUJEO MAX SOLOSTAR 300 UNIT/ML Solostar Pen INJECT 60 UNITS INTO THE SKIN AT BEDTIME. 08/22/21   Nida, Denman George, MD  zinc sulfate 220 (50 Zn) MG capsule Take 1 capsule (220 mg total) by mouth daily. 11/01/22   Shon Hale, MD      Allergies    Demerol [meperidine hcl]    Review of Systems   Review of Systems  Respiratory:  Positive for cough.     Physical Exam Updated Vital Signs BP (!) 149/89 (BP Location: Right Arm)   Pulse 86   Temp 98 F (36.7 C) (Oral)   Resp 18   Ht 5\' 10"  (1.778 m)   Wt 99.8 kg   SpO2 97%   BMI  31.57 kg/m  Physical Exam  ED Results / Procedures / Treatments   Labs (all labs ordered are listed, but only abnormal results are displayed) Labs Reviewed  CBC WITH DIFFERENTIAL/PLATELET - Abnormal; Notable for the following components:      Result Value   WBC 0.9 (*)    RBC 3.29 (*)    Hemoglobin 12.1 (*)    HCT 33.7 (*)    MCV 102.4 (*)    MCH 36.8 (*)    RDW 16.6 (*)    Platelets 58 (*)    Neutro Abs 0.4 (*)    Lymphs Abs 0.5 (*)    Monocytes Absolute 0.0 (*)    All other components within normal limits  COMPREHENSIVE METABOLIC PANEL - Abnormal; Notable for the following components:   Sodium 132 (*)    Glucose, Bld 241 (*)    Calcium 8.2 (*)    Total Bilirubin 1.3 (*)    All other components within normal limits  RESP PANEL  BY RT-PCR (RSV, FLU A&B, COVID)  [id number]  CULTURE, BLOOD (ROUTINE X 2)  CULTURE, BLOOD (ROUTINE X 2)  LACTIC ACID, PLASMA  URINALYSIS, ROUTINE W REFLEX MICROSCOPIC  STREP PNEUMONIAE URINARY ANTIGEN  LEGIONELLA PNEUMOPHILA SEROGP 1 UR AG  MAGNESIUM  PHOSPHORUS  TSH  HEMOGLOBIN A1C    EKG None  Radiology DG Hip Unilat W or Wo Pelvis 2-3 Views Left  Result Date: 05/06/2023 CLINICAL DATA:  pain EXAM: DG HIP (WITH OR WITHOUT PELVIS) 2-3V LEFT COMPARISON:  CT 12/19/2022 FINDINGS: There is no evidence of hip fracture or dislocation. There is no evidence of arthropathy or other focal bone abnormality. Iliac arterial calcifications. Degenerative disc disease L4-5. IMPRESSION: Negative. Electronically Signed   By: Corlis Leak M.D.   On: 05/06/2023 08:54   DG Chest Port 1 View  Result Date: 05/06/2023 CLINICAL DATA:  sob EXAM: PORTABLE CHEST - 1 VIEW COMPARISON:  10/29/2022 FINDINGS: Decrease in left lower lobe infiltrate.  Right lung clear. Heart size and mediastinal contours are within normal limits. Aortic Atherosclerosis (ICD10-170.0). No effusion. CABG markers and sternotomy wires. IMPRESSION: Improving left lower lobe infiltrate. Electronically Signed   By: Corlis Leak M.D.   On: 05/06/2023 08:52    Procedures Procedures  {Document cardiac monitor, telemetry assessment procedure when appropriate:1}  Medications Ordered in ED Medications  cefTRIAXone (ROCEPHIN) 2 g in sodium chloride 0.9 % 100 mL IVPB (2 g Intravenous New Bag/Given 05/06/23 1141)  azithromycin (ZITHROMAX) 500 mg in sodium chloride 0.9 % 250 mL IVPB (500 mg Intravenous New Bag/Given 05/06/23 1142)  cefTRIAXone (ROCEPHIN) 2 g in sodium chloride 0.9 % 100 mL IVPB (has no administration in time range)  azithromycin (ZITHROMAX) 500 mg in sodium chloride 0.9 % 250 mL IVPB (has no administration in time range)  0.9 %  sodium chloride infusion (has no administration in time range)  acetaminophen (TYLENOL) tablet 650 mg (has no  administration in time range)    Or  acetaminophen (TYLENOL) suppository 650 mg (has no administration in time range)  ondansetron (ZOFRAN) tablet 4 mg (has no administration in time range)    Or  ondansetron (ZOFRAN) injection 4 mg (has no administration in time range)  ipratropium-albuterol (DUONEB) 0.5-2.5 (3) MG/3ML nebulizer solution 3 mL (has no administration in time range)  dextromethorphan-guaiFENesin (MUCINEX DM) 30-600 MG per 12 hr tablet 1 tablet (has no administration in time range)  aspirin EC tablet 81 mg (has no administration in time range)  atorvastatin (LIPITOR) tablet 80 mg (has  no administration in time range)  cyanocobalamin (VITAMIN B12) tablet 1,000 mcg (has no administration in time range)  ezetimibe (ZETIA) tablet 10 mg (has no administration in time range)  Vitamin D3 CAPS 1,000 Units (has no administration in time range)  levothyroxine (SYNTHROID) tablet 50 mcg (has no administration in time range)  gabapentin (NEURONTIN) capsule 300 mg (has no administration in time range)  metoprolol succinate (TOPROL-XL) 24 hr tablet 25 mg (has no administration in time range)  tamsulosin (FLOMAX) capsule 0.4 mg (has no administration in time range)  zinc sulfate capsule 220 mg (has no administration in time range)  insulin aspart (novoLOG) injection 0-9 Units (has no administration in time range)  insulin aspart (novoLOG) injection 0-5 Units (has no administration in time range)  insulin glargine-yfgn (SEMGLEE) injection 40 Units (has no administration in time range)    ED Course/ Medical Decision Making/ A&P   {Patient with pneumonia and leukopenia.  I spoke with oncology Dr. Ellin Saba and he felt like the patient needs to be admitted for treatment of the pneumonia and following his white count Click here for ABCD2, HEART and other calculatorsREFRESH Note before signing :1}                              Medical Decision Making Amount and/or Complexity of Data  Reviewed Labs: ordered. Radiology: ordered.  Risk Decision regarding hospitalization.   Community-acquired pneumonia and leukopenia.  Patient will be admitted to medicine  {Document critical care time when appropriate:1} {Document review of labs and clinical decision tools ie heart score, Chads2Vasc2 etc:1}  {Document your independent review of radiology images, and any outside records:1} {Document your discussion with family members, caretakers, and with consultants:1} {Document social determinants of health affecting pt's care:1} {Document your decision making why or why not admission, treatments were needed:1} Final Clinical Impression(s) / ED Diagnoses Final diagnoses:  Community acquired pneumonia of left lower lobe of lung    Rx / DC Orders ED Discharge Orders     None

## 2023-05-06 NOTE — Assessment & Plan Note (Signed)
-  Continue treatment with Lipitor and Zetia -Heart healthy diet has been encouraged.

## 2023-05-06 NOTE — Assessment & Plan Note (Signed)
-  A1c will be updated -Follow CBGs fluctuation and adjust hypoglycemic regimen as needed -Sliding scale insulin and Semglee has been ordered

## 2023-05-06 NOTE — ED Notes (Signed)
ED TO INPATIENT HANDOFF REPORT  ED Nurse Name and Phone #: Wandra Mannan, Paramedic [phone]  S Name/Age/Gender Mark Foster 67 y.o. male Room/Bed: APA17/APA17  Code Status   Code Status: Full Code  Home/SNF/Other Home Patient oriented to: self, place, time, and situation Is this baseline? Yes   Triage Complete: Triage complete  Chief Complaint CAP (community acquired pneumonia) [J18.9]  Triage Note Pt reports chills and mild cough x 3 days and pain running down his left leg x 1 week.   Allergies Allergies  Allergen Reactions   Demerol [Meperidine Hcl] Other (See Comments)    convulsions    Level of Care/Admitting Diagnosis ED Disposition     ED Disposition  Admit   Condition  --   Comment  Hospital Area: [facility] [[id number]]  Level of Care: Med-Surg [16]  Covid Evaluation: Confirmed COVID Negative  Diagnosis: CAP (community acquired pneumonia) [147829]  Admitting Physician: Vassie Loll [[id number]]  Attending Physician: Vassie Loll [[id number]]          B Medical/Surgery History Past Medical History:  Diagnosis Date   Bilateral carpal tunnel syndrome 10/19/2020   CAD (coronary artery disease)    a. 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   Type 2 diabetes mellitus (HCC)    Past Surgical History:  Procedure Laterality Date   APPENDECTOMY     BUBBLE STUDY N/A 11/18/2019   Procedure: BUBBLE STUDY;  Surgeon: Laqueta Linden, MD;  Location: [facility];  Service: Cardiology;  Laterality: N/A;   CARDIAC SURGERY     COLON RESECTION     For  diverticulitis   COLON SURGERY     CORONARY ARTERY BYPASS GRAFT N/A 01/26/2020   Procedure: CORONARY ARTERY BYPASS GRAFTING (CABG) TIMES FOUR USING LEFT INTERNAL MAMMARY VEIN AND RIGHT GREATER SAPHENOUS VEIN;  Surgeon: Loreli Slot, MD;  Location: [facility] OR;  Service: Open Heart Surgery;  Laterality: N/A;   ENDOVEIN HARVEST OF GREATER SAPHENOUS VEIN Right 01/26/2020   Procedure: Mack Guise Of Greater Saphenous Vein;  Surgeon: Loreli Slot, MD;  Location: [facility] OR;  Service: Open Heart Surgery;  Laterality: Right;   ESOPHAGOGASTRODUODENOSCOPY N/A 10/03/2018   Procedure: ESOPHAGOGASTRODUODENOSCOPY (EGD);  Surgeon: Rachael Fee, MD;  Location: [facility];  Service: Endoscopy;  Laterality: N/A;   EUS N/A 10/03/2018   Procedure: UPPER ENDOSCOPIC ULTRASOUND (EUS) RADIAL;  Surgeon: Rachael Fee, MD;  Location: [facility];  Service: Endoscopy;  Laterality: N/A;   EYE SURGERY Bilateral    FINE NEEDLE ASPIRATION N/A 10/03/2018   Procedure: FINE NEEDLE ASPIRATION (FNA) LINEAR;  Surgeon: Rachael Fee, MD;  Location: [facility];  Service: Endoscopy;  Laterality: N/A;   IR ANGIO INTRA EXTRACRAN SEL COM CAROTID INNOMINATE BILAT MOD SED  01/21/2018   IR ANGIO INTRA EXTRACRAN SEL COM CAROTID INNOMINATE UNI R MOD SED  03/21/2018   IR ANGIO VERTEBRAL SEL VERTEBRAL BILAT MOD SED  01/21/2018   IR TRANSCATH EXCRAN VERT OR CAR A STENT  03/21/2018   LEFT HEART CATH AND CORONARY  ANGIOGRAPHY N/A 01/08/2020   Procedure: LEFT HEART CATH AND CORONARY ANGIOGRAPHY;  Surgeon: Marykay Lex, MD;  Location: [facility] INVASIVE CV LAB;  Service: Cardiovascular;  Laterality: N/A;   PORT-A-CATH REMOVAL N/A 06/20/2021   Procedure: MINOR REMOVAL PORT-A-CATH;  Surgeon: Lucretia Roers, MD;  Location: [facility];  Service: General;  Laterality: N/A;   PORTACATH PLACEMENT Left 12/23/2018   Procedure: INSERTION PORT-A-CATH;  Surgeon: Lucretia Roers, MD;  Location: [facility];  Service: General;  Laterality: Left;    RADIOLOGY WITH ANESTHESIA N/A 03/21/2018   Procedure: IR WITH ANESTHESIA WITH STENT PLACEMENT;  Surgeon: Julieanne Cotton, MD;  Location: [facility];  Service: Radiology;  Laterality: N/A;   ROTATOR CUFF REPAIR     Left   SPLENECTOMY     TEE WITHOUT CARDIOVERSION N/A 11/18/2019   Procedure: TRANSESOPHAGEAL ECHOCARDIOGRAM (TEE) WITH PROPOFOL;  Surgeon: Laqueta Linden, MD;  Location: [facility];  Service: Cardiology;  Laterality: N/A;   TEE WITHOUT CARDIOVERSION N/A 01/26/2020   Procedure: TRANSESOPHAGEAL ECHOCARDIOGRAM (TEE);  Surgeon: Loreli Slot, MD;  Location: [facility] OR;  Service: Open Heart Surgery;  Laterality: N/A;   TONSILLECTOMY       A IV Location/Drains/Wounds Patient Lines/Drains/Airways Status     Active Line/Drains/Airways     Name Placement date Placement time Site Days   Implanted Port 12/23/18 Left Chest 12/23/18  0757  Chest  1595   Peripheral IV 05/06/23 20 G 1" Anterior;Left;Proximal Forearm 05/06/23  1147  Forearm  less than 1   External Urinary Catheter 10/29/22  1123  --  189            Intake/Output Last 24 hours No intake or output data in the 24 hours ending 05/06/23 1234  Labs/Imaging Results for orders placed or performed during the hospital encounter of 05/06/23 (from the past 48 hour(s))  CBC with Differential     Status: Abnormal   Collection Time: 05/06/23  8:10 AM  Result Value Ref Range   WBC 0.9 (LL) 4.0 - 10.5 K/uL    Comment: REPEATED TO VERIFY WHITE COUNT CONFIRMED ON SMEAR THIS CRITICAL RESULT HAS VERIFIED AND BEEN CALLED TO EANES M BY LATISHA HENDERSON ON 09 01 2024 AT 0843, AND HAS BEEN READ BACK.     RBC 3.29 (L) 4.22 - 5.81 MIL/uL   Hemoglobin 12.1 (L) 13.0 - 17.0 g/dL   HCT 29.5 (L) 28.4 - 13.2 %   MCV 102.4 (H) 80.0 - 100.0 fL   MCH 36.8 (H) 26.0 - 34.0 pg   MCHC 35.9 30.0 - 36.0 g/dL   RDW 44.0 (H) 10.2 - 72.5 %   Platelets 58 (L) 150 - 400 K/uL    Comment: SPECIMEN CHECKED FOR CLOTS REPEATED TO VERIFY PLATELET COUNT  CONFIRMED BY SMEAR    nRBC 0.0 0.0 - 0.2 %   Neutrophils Relative % 47 %   Neutro Abs 0.4 (LL) 1.7 - 7.7 K/uL    Comment: REPEATED TO VERIFY THIS CRITICAL RESULT HAS VERIFIED AND BEEN CALLED TO EANES M BY LATISHA HENDERSON ON 09 01 2024 AT 0846, AND HAS BEEN READ BACK.     Lymphocytes Relative 50 %   Lymphs Abs 0.5 (L) 0.7 - 4.0 K/uL   Monocytes Relative 2 %   Monocytes Absolute 0.0 (L) 0.1 - 1.0 K/uL   Eosinophils Relative 1 %   Eosinophils Absolute 0.0 0.0 - 0.5 K/uL   Basophils Relative 0 %   Basophils Absolute 0.0 0.0 - 0.1 K/uL  RBC Morphology MORPHOLOGY UNREMARKABLE    Smear Review PLATELET COUNT CONFIRMED BY SMEAR    Immature Granulocytes 0 %   Abs Immature Granulocytes 0.00 0.00 - 0.07 K/uL   Reactive, Benign Lymphocytes PRESENT     Comment: Performed at [facility], [address], [place], [place] [address]  Comprehensive metabolic panel     Status: Abnormal   Collection Time: 05/06/23  8:10 AM  Result Value Ref Range   Sodium 132 (L) 135 - 145 mmol/L   Potassium 4.1 3.5 - 5.1 mmol/L   Chloride 99 98 - 111 mmol/L   CO2 24 22 - 32 mmol/L   Glucose, Bld 241 (H) 70 - 99 mg/dL    Comment: Glucose reference range applies only to samples taken after fasting for at least 8 hours.   BUN 17 8 - 23 mg/dL   Creatinine, Ser 1.19 0.61 - 1.24 mg/dL   Calcium 8.2 (L) 8.9 - 10.3 mg/dL   Total Protein 6.6 6.5 - 8.1 g/dL   Albumin 3.7 3.5 - 5.0 g/dL   AST 28 15 - 41 U/L   ALT 33 0 - 44 U/L   Alkaline Phosphatase 52 38 - 126 U/L   Total Bilirubin 1.3 (H) 0.3 - 1.2 mg/dL   GFR, Estimated >14 >78 mL/min    Comment: (NOTE) Calculated using the CKD-EPI Creatinine Equation (2021)    Anion gap 9 5 - 15    Comment: Performed at [facility], [address], [place], [place] [address]  Resp panel by RT-PCR (RSV, Flu A&B, Covid) Anterior Nasal Swab     Status: None   Collection Time: 05/06/23  8:15 AM   Specimen: Anterior Nasal Swab  Result Value Ref Range   SARS Coronavirus 2 by RT  PCR NEGATIVE NEGATIVE    Comment: (NOTE) SARS-CoV-2 target nucleic acids are NOT DETECTED.  The SARS-CoV-2 RNA is generally detectable in upper respiratory specimens during the acute phase of infection. The lowest concentration of SARS-CoV-2 viral copies this assay can detect is 138 copies/mL. A negative result does not preclude SARS-Cov-2 infection and should not be used as the sole basis for treatment or other patient management decisions. A negative result may occur with  improper specimen collection/handling, submission of specimen other than nasopharyngeal swab, presence of viral mutation(s) within the areas targeted by this assay, and inadequate number of viral copies(<138 copies/mL). A negative result must be combined with clinical observations, patient history, and epidemiological information. The expected result is Negative.  Fact Sheet for Patients:  BloggerCourse.com  Fact Sheet for Healthcare Providers:  SeriousBroker.it  This test is no t yet approved or cleared by the [place] FDA and  has been authorized for detection and/or diagnosis of SARS-CoV-2 by FDA under an Emergency Use Authorization (EUA). This EUA will remain  in effect (meaning this test can be used) for the duration of the COVID-19 declaration under Section 564(b)(1) of the Act, 21 U.S.C.section 360bbb-3(b)(1), unless the authorization is terminated  or revoked sooner.       Influenza A by PCR NEGATIVE NEGATIVE   Influenza B by PCR NEGATIVE NEGATIVE    Comment: (NOTE) The Xpert Xpress SARS-CoV-2/FLU/RSV plus assay is intended as an aid in the diagnosis of influenza from Nasopharyngeal swab specimens and should not be used as a sole basis for treatment. Nasal washings and aspirates are unacceptable for Xpert Xpress SARS-CoV-2/FLU/RSV testing.  Fact Sheet for Patients: BloggerCourse.com  Fact Sheet for Healthcare  Providers: SeriousBroker.it  This test is  not yet approved or cleared by the [place] and has been authorized for detection and/or diagnosis of SARS-CoV-2 by FDA under an Emergency Use Authorization (EUA). This EUA will remain in effect (meaning this test can be used) for the duration of the COVID-19 declaration under Section 564(b)(1) of the Act, 21 U.S.C. section 360bbb-3(b)(1), unless the authorization is terminated or revoked.     Resp Syncytial Virus by PCR NEGATIVE NEGATIVE    Comment: (NOTE) Fact Sheet for Patients: BloggerCourse.com  Fact Sheet for Healthcare Providers: SeriousBroker.it  This test is not yet approved or cleared by the [place] FDA and has been authorized for detection and/or diagnosis of SARS-CoV-2 by FDA under an Emergency Use Authorization (EUA). This EUA will remain in effect (meaning this test can be used) for the duration of the COVID-19 declaration under Section 564(b)(1) of the Act, 21 U.S.C. section 360bbb-3(b)(1), unless the authorization is terminated or revoked.  Performed at [facility], [address], [place], [place] [address]   Blood culture (routine x 2)     Status: None (Preliminary result)   Collection Time: 05/06/23  9:25 AM   Specimen: Vein; Blood  Result Value Ref Range   Specimen Description      RIGHT ANTECUBITAL BOTTLES DRAWN AEROBIC AND ANAEROBIC   Special Requests      Blood Culture adequate volume Performed at [facility], [address] [place], [place] [address]    Culture PENDING    Report Status PENDING   Blood culture (routine x 2)     Status: None (Preliminary result)   Collection Time: 05/06/23  9:25 AM   Specimen: Vein; Blood  Result Value Ref Range   Specimen Description      LEFT ANTECUBITAL BOTTLES DRAWN AEROBIC AND ANAEROBIC   Special Requests      Blood Culture adequate volume Performed at [facility], [address] [place], [place] [address]    Culture PENDING    Report Status PENDING   Lactic acid, plasma     Status: None   Collection Time: 05/06/23  9:25 AM  Result Value Ref Range   Lactic Acid, Venous 1.1 0.5 - 1.9 mmol/L    Comment: Performed at [facility], [address] [place], [place] [address]  Urinalysis, Routine w reflex microscopic -Urine, Clean Catch     Status: Abnormal   Collection Time: 05/06/23 11:44 AM  Result Value Ref Range   Color, Urine YELLOW YELLOW   APPearance CLEAR CLEAR   Specific Gravity, Urine 1.016 1.005 - 1.030   pH 5.0 5.0 - 8.0   Glucose, UA >=500 (A) NEGATIVE mg/dL   Hgb urine dipstick NEGATIVE NEGATIVE   Bilirubin Urine NEGATIVE NEGATIVE   Ketones, ur NEGATIVE NEGATIVE mg/dL   Protein, ur NEGATIVE NEGATIVE mg/dL   Nitrite NEGATIVE NEGATIVE   Leukocytes,Ua NEGATIVE NEGATIVE   RBC / HPF 0-5 0 - 5 RBC/hpf   WBC, UA 0-5 0 - 5 WBC/hpf   Bacteria, UA NONE SEEN NONE SEEN   Squamous Epithelial / HPF 0-5 0 - 5 /HPF   Mucus PRESENT     Comment: Performed at [facility], [address] [place], [place] [address]  Magnesium     Status: None   Collection Time: 05/06/23 11:47 AM  Result Value Ref Range   Magnesium 1.8 1.7 - 2.4 mg/dL    Comment: Performed at [facility], [address] [place], [place] [address]  Phosphorus     Status: None  Collection Time: 05/06/23 11:47 AM  Result Value Ref Range   Phosphorus 2.8 2.5 - 4.6 mg/dL    Comment: Performed at [facility], [address] [place], [place] [address]   DG Hip Lucienne Capers or Wo Pelvis 2-3 Views Left  Result Date: 05/06/2023 CLINICAL DATA:  pain EXAM: DG HIP (WITH OR WITHOUT PELVIS) 2-3V LEFT COMPARISON:  CT 12/19/2022 FINDINGS: There is no evidence of hip fracture or dislocation. There is no evidence of arthropathy or other focal bone abnormality. Iliac arterial calcifications. Degenerative disc disease L4-5. IMPRESSION: Negative. Electronically Signed   By: Corlis Leak M.D.   On: 05/06/2023 08:54    DG Chest Port 1 View  Result Date: 05/06/2023 CLINICAL DATA:  sob EXAM: PORTABLE CHEST - 1 VIEW COMPARISON:  10/29/2022 FINDINGS: Decrease in left lower lobe infiltrate.  Right lung clear. Heart size and mediastinal contours are within normal limits. Aortic Atherosclerosis (ICD10-170.0). No effusion. CABG markers and sternotomy wires. IMPRESSION: Improving left lower lobe infiltrate. Electronically Signed   By: Corlis Leak M.D.   On: 05/06/2023 08:52    Pending Labs Unresulted Labs (From admission, onward)     Start     Ordered   05/07/23 0500  CBC  Tomorrow morning,   R        05/06/23 1148   05/07/23 0500  Basic metabolic panel  Tomorrow morning,   R        05/06/23 1148   05/06/23 1150  Hemoglobin A1c  Once,   R       Comments: To assess prior glycemic control    05/06/23 1150   05/06/23 1147  TSH  Once,   R        05/06/23 1148   05/06/23 1146  Strep pneumoniae urinary antigen  (COPD / Pneumonia / Cellulitis / Lower Extremity Wound)  Once,   R        05/06/23 1148   05/06/23 1146  Legionella Pneumophila Serogp 1 Ur Ag  (COPD / Pneumonia / Cellulitis / Lower Extremity Wound)  Once,   R        05/06/23 1148            Vitals/Pain Today's Vitals   05/06/23 0756 05/06/23 0757 05/06/23 1144  BP: 122/63  (!) 149/89  Pulse: 73  86  Resp: 16  18  Temp: 98.1 F (36.7 C)  98 F (36.7 C)  TempSrc: Oral  Oral  SpO2: 95%  97%  Weight:  220 lb (99.8 kg)   Height:  5\' 10"  (1.778 m)   PainSc:  6      Isolation Precautions Droplet precaution  Medications Medications  azithromycin (ZITHROMAX) 500 mg in sodium chloride 0.9 % 250 mL IVPB (500 mg Intravenous New Bag/Given 05/06/23 1142)  cefTRIAXone (ROCEPHIN) 2 g in sodium chloride 0.9 % 100 mL IVPB (has no administration in time range)  azithromycin (ZITHROMAX) 500 mg in sodium chloride 0.9 % 250 mL IVPB (has no administration in time range)  0.9 %  sodium chloride infusion (has no administration in time range)  acetaminophen  (TYLENOL) tablet 650 mg (has no administration in time range)    Or  acetaminophen (TYLENOL) suppository 650 mg (has no administration in time range)  ondansetron (ZOFRAN) tablet 4 mg (has no administration in time range)    Or  ondansetron (ZOFRAN) injection 4 mg (has no administration in time range)  ipratropium-albuterol (DUONEB) 0.5-2.5 (3) MG/3ML nebulizer solution 3 mL (has no administration in time  range)  dextromethorphan-guaiFENesin (MUCINEX DM) 30-600 MG per 12 hr tablet 1 tablet (has no administration in time range)  aspirin EC tablet 81 mg (has no administration in time range)  atorvastatin (LIPITOR) tablet 80 mg (has no administration in time range)  cyanocobalamin (VITAMIN B12) tablet 1,000 mcg (has no administration in time range)  ezetimibe (ZETIA) tablet 10 mg (has no administration in time range)  cholecalciferol (VITAMIN D3) 25 MCG (1000 UNIT) tablet 1,000 Units (has no administration in time range)  levothyroxine (SYNTHROID) tablet 50 mcg (has no administration in time range)  gabapentin (NEURONTIN) capsule 300 mg (has no administration in time range)  metoprolol succinate (TOPROL-XL) 24 hr tablet 25 mg (has no administration in time range)  tamsulosin (FLOMAX) capsule 0.4 mg (has no administration in time range)  zinc sulfate capsule 220 mg (has no administration in time range)  insulin aspart (novoLOG) injection 0-9 Units (has no administration in time range)  insulin aspart (novoLOG) injection 0-5 Units (has no administration in time range)  insulin glargine-yfgn (SEMGLEE) injection 40 Units (has no administration in time range)  cefTRIAXone (ROCEPHIN) 2 g in sodium chloride 0.9 % 100 mL IVPB (0 g Intravenous Stopped 05/06/23 1231)    Mobility walks     Focused Assessments Pulmonary Assessment Handoff:  Lung sounds:   O2 Device: Room Air      R Recommendations: See Admitting Provider Note  Report given to:   Additional Notes: NA

## 2023-05-06 NOTE — ED Triage Notes (Signed)
Pt reports chills and mild cough x 3 days and pain running down his left leg x 1 week.

## 2023-05-06 NOTE — Assessment & Plan Note (Signed)
-  Continue home antihypertensive agents -Heart healthy diet has been ordered

## 2023-05-06 NOTE — Assessment & Plan Note (Deleted)
-  A1c will be updated -Follow CBGs fluctuation and adjust hypoglycemic regimen as needed -Sliding scale insulin and Semglee has been ordered

## 2023-05-06 NOTE — Progress Notes (Signed)
Pharmacy Antibiotic Note  [name] is a 67 y.o. male admitted on 05/06/2023 with  febrile neutropenia .  Pharmacy has been consulted for Cefepime dosing.  Ceftriaxone 2gm IV and Azithromycin 500 mg IV given ~12n.  Tmax 102.2. WBC 0.9. - Granix 480 mcg SQ x 1 to be given tonight.  Plan: Cefepime 2gm IV q8h. Follow renal function, culture data, clinical progress.  Height: 5\' 10"  (177.8 cm) Weight: 99.8 kg (220 lb) IBW/kg (Calculated) : 73  Temp (24hrs), Avg:99.2 F (37.3 C), Min:98 F (36.7 C), Max:102.2 F (39 C)  Recent Labs  Lab 05/06/23 0810 05/06/23 0925  WBC 0.9*  --   CREATININE 0.80  --   LATICACIDVEN  --  1.1    Estimated Creatinine Clearance: 104.6 mL/min (by C-G formula based on SCr of 0.8 mg/dL).    Allergies  Allergen Reactions   Demerol [Meperidine Hcl] Other (See Comments)    convulsions    Antimicrobials this admission: Ceftriaxone  x 1 on 9/1 Azithromycin IV x 1 on 9/1 Cefepime 9/1>>  Dose adjustments this admission: n/a  Microbiology results: 9/1 blood: sent 9/1 COVID: negative  Thank you for allowing pharmacy to be a part of this patient's care.  Dennie Mark Foster, [place] 05/06/2023 5:11 PM

## 2023-05-07 DIAGNOSIS — J189 Pneumonia, unspecified organism: Secondary | ICD-10-CM | POA: Diagnosis not present

## 2023-05-07 DIAGNOSIS — E782 Mixed hyperlipidemia: Secondary | ICD-10-CM | POA: Diagnosis not present

## 2023-05-07 DIAGNOSIS — D709 Neutropenia, unspecified: Secondary | ICD-10-CM

## 2023-05-07 DIAGNOSIS — R5081 Fever presenting with conditions classified elsewhere: Secondary | ICD-10-CM

## 2023-05-07 DIAGNOSIS — Z720 Tobacco use: Secondary | ICD-10-CM | POA: Diagnosis not present

## 2023-05-07 LAB — BASIC METABOLIC PANEL
Anion gap: 6 (ref 5–15)
BUN: 14 mg/dL (ref 8–23)
CO2: 24 mmol/L (ref 22–32)
Calcium: 8 mg/dL — ABNORMAL LOW (ref 8.9–10.3)
Chloride: 102 mmol/L (ref 98–111)
Creatinine, Ser: 0.93 mg/dL (ref 0.61–1.24)
GFR, Estimated: >60 mL/min (ref >60)
Glucose, Bld: 145 mg/dL — ABNORMAL HIGH (ref 70–99)
Potassium: 3.8 mmol/L (ref 3.5–5.1)
Sodium: 132 mmol/L — ABNORMAL LOW (ref 135–145)

## 2023-05-07 LAB — CBC WITH DIFFERENTIAL/PLATELET
Abs Immature Granulocytes: 0 10*3/uL (ref 0.00–0.07)
Band Neutrophils: 2 %
Basophils Absolute: 0 10*3/uL (ref 0.0–0.1)
Basophils Relative: 0 %
Eosinophils Absolute: 0 10*3/uL (ref 0.0–0.5)
Eosinophils Relative: 0 %
HCT: 32.2 % — ABNORMAL LOW (ref 39.0–52.0)
Hemoglobin: 11.6 g/dL — ABNORMAL LOW (ref 13.0–17.0)
Lymphocytes Relative: 52 %
Lymphs Abs: 1.2 10*3/uL (ref 0.7–4.0)
MCH: 36.9 pg — ABNORMAL HIGH (ref 26.0–34.0)
MCHC: 36 g/dL (ref 30.0–36.0)
MCV: 102.5 fL — ABNORMAL HIGH (ref 80.0–100.0)
Monocytes Absolute: 0 10*3/uL — ABNORMAL LOW (ref 0.1–1.0)
Monocytes Relative: 0 %
Neutro Abs: 1.1 10*3/uL — ABNORMAL LOW (ref 1.7–7.7)
Neutrophils Relative %: 46 %
Platelets: 54 10*3/uL — ABNORMAL LOW (ref 150–400)
RBC: 3.14 MIL/uL — ABNORMAL LOW (ref 4.22–5.81)
RDW: 16.8 % — ABNORMAL HIGH (ref 11.5–15.5)
WBC: 2.3 10*3/uL — ABNORMAL LOW (ref 4.0–10.5)
nRBC: 0 % (ref 0.0–0.2)

## 2023-05-07 LAB — GLUCOSE, CAPILLARY
Glucose-Capillary: 137 mg/dL — ABNORMAL HIGH (ref 70–99)
Glucose-Capillary: 254 mg/dL — ABNORMAL HIGH (ref 70–99)
Glucose-Capillary: 225 mg/dL — ABNORMAL HIGH (ref 70–99)
Glucose-Capillary: 269 mg/dL — ABNORMAL HIGH (ref 70–99)

## 2023-05-07 LAB — STREP PNEUMONIAE URINARY ANTIGEN: Strep Pneumo Urinary Antigen: NEGATIVE

## 2023-05-07 MED ORDER — HYDROMORPHONE HCL 1 MG/ML IJ SOLN
1.0000 mg | INTRAMUSCULAR | Status: DC | PRN
  Administered 2023-05-09 – 2023-05-11 (×7): 1 mg via INTRAVENOUS
  Filled 2023-05-07 (×8): qty 1

## 2023-05-07 MED ORDER — OXYCODONE HCL 5 MG PO TABS
5.0000 mg | ORAL_TABLET | Freq: Four times a day (QID) | ORAL | Status: DC | PRN
  Administered 2023-05-08 – 2023-05-15 (×7): 5 mg via ORAL
  Filled 2023-05-07 (×7): qty 1

## 2023-05-07 NOTE — Progress Notes (Signed)
Pt lives with wife and is independent with ADLs. No home health services prior to admission. TOC will follow.   05/07/23 0825  TOC Brief Assessment  Insurance and Status Reviewed  Patient has primary care physician Yes  Home environment has been reviewed Lives with wife.  Prior level of function: Independent.  Prior/Current Home Services No current home services  Social Determinants of Health Reivew SDOH reviewed no interventions necessary  Readmission risk has been reviewed Yes  Transition of care needs no transition of care needs at this time

## 2023-05-07 NOTE — Assessment & Plan Note (Signed)
-  Neutrophils/WBCs to stabilize after Granix infusion. -Continue to follow culture results. -Given still ongoing episodes of fever 2D echo has been ordered and will follow results -Infectious disease has been contacted for assistance with concern for FUO; will follow recommendations. -Antibiotic has been Broaden and will continue as needed antipyretics.

## 2023-05-07 NOTE — Progress Notes (Signed)
Mobility Specialist Progress Note:    05/07/23 1100  Mobility  Activity Ambulated with assistance in hallway  Level of Assistance Modified independent, requires aide device or extra time  Assistive Device Other (Comment) (IVpole)  Distance Ambulated (ft) 120 ft  Range of Motion/Exercises Active;All extremities  Activity Response Tolerated well  Mobility Referral Yes  $Mobility charge 1 Mobility  Mobility Specialist Start Time (ACUTE ONLY) 1020  Mobility Specialist Stop Time (ACUTE ONLY) 1030  Mobility Specialist Time Calculation (min) (ACUTE ONLY) 10 min   Pt received in chair, family in room. Required SBA to stand and ambulate with ModI using the IV pole. Tolerated well, denies SOB. Returned pt to room, all needs met.    Lawerance Bach Mobility Specialist Please contact via Special educational needs teacher or  Rehab office at [phone]

## 2023-05-07 NOTE — Progress Notes (Signed)
Progress Note   Patient: Mark Foster:[id number] DOB: 1956-02-10 DOA: 05/06/2023     1 DOS: the patient was seen and examined on 05/07/2023   Brief hospital admission narrative: Mark Foster is a 67 y.o. male with medical history significant of pancreatic cancer, prior history of stroke, hypertension, hyperlipidemia, type 2 diabetes, hypothyroidism and prior hx of tobacco abuse; who presented to the emergency department secondary to mildly productive coughing spells, chills, general malaise and shortness of breath.  Patient reports symptom has been present for approximately 3 days prior to admission and worsening.  Patient expressed no sick contacts, no chest pain, no nausea, no vomiting, no dysuria, no hematuria, no new focal deficits, no melena and no hematochezia.   Patient reported also significant pain in his lower back running down his leg that have been present for the last week or so.   Workup in the ED demonstrated negative COVID PCR, chest x-ray with left lower lobe infiltrate and worsening neutropenia/thrombocytopenia.  Cultures taken, antibiotics started and case discussed with oncology service who has recommended admission to the hospital for IV antibiotics and further management.  [facility] contacted to admit patient for further treatment  Assessment and Plan: * CAP (community acquired pneumonia) -Patient reports 2-3 days of productive cough, short winded sensation, chills and general malaise. -Found with worsening neutropenia and spiking fever -Continue to follow culture result -Continue the use of cefepime and Zithromax -Maintain adequate hydration and provide as needed antipyretics -Granix x 1 provided and will follow up response in patient's neutrophil counts. -COVID PCR negative -Continue mucolytics and as needed bronchodilators.  Pancreatic adenocarcinoma (HCC) -Status post surgical resection -Continue outpatient follow-up with oncology service. -Currently not receiving  chemotherapy management. -Continue supportive care.  Neutropenic fever (HCC) -As mentioned above -Continue IV antibiotic -Follow neutrophil trends -Follow culture results.  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 -Discussed with patient regarding keeping himself tobacco free and avoid secondhand exposure.  Hypothyroidism -TSH within normal limits. -Continue Synthroid  Diabetes mellitus type 2 in nonobese (HCC) -A1c 7.2 -Continue to follow CBGs fluctuation and adjust hypoglycemic regimen as needed -Continue sliding scale insulin and Semglee has been ordered  Mixed hyperlipidemia -Continue treatment with Lipitor and Zetia -Heart healthy diet has been encouraged.  Essential hypertension, benign -Continue home antihypertensive agents -Heart healthy diet has been ordered  Left lower extremity pain -Images demonstrating no acute fracture -Continue as needed analgesics -Tylenol, oxycodone and as needed Dilaudid has been ordered.  Subjective:  In moderate distress secondary to left lower extremity pain; no chest pain, no nausea, no vomiting.  Reports breathing is better.  Continues spiking fever.  Physical Exam: Vitals:   05/07/23 0822 05/07/23 0924 05/07/23 1302 05/07/23 1442  BP: (!) 136/59 122/69 132/64 130/74  Pulse: 91 90 95 91  Resp: 20  20 (!) 22  Temp: 99.2 F (37.3 C)  98.7 F (37.1 C) 98.7 F (37.1 C)  TempSrc: Oral  Oral Oral  SpO2: 96% 96% 95% 96%  Weight:      Height:       General exam: Alert, awake, oriented x 3; feeling somewhat better.  Still spiking fever complaining of left lower extremity pain/neuropathy with sciatica. Respiratory system: Scattered rhonchi bilaterally; no wheezing.  No using accessory muscle. Cardiovascular system:RRR. No rubs or gallops; no JVD. Gastrointestinal system: Abdomen is obese, nondistended, soft and nontender. No organomegaly or masses felt. Normal bowel sounds heard. Central  nervous system: No focal neurological deficits. Extremities:  No cyanosis or clubbing. Skin: No petechiae. Psychiatry: Judgement and insight appear normal. Mood & affect appropriate.   Data Reviewed: CBC with differential: White blood cells 2.3, hemoglobin 11.6 and platelet count 54K; absolute neutrophil 1.1 Basic metabolic panel: Sodium 132, potassium 3.8, chloride 102, bicarb 24, BUN 14, creatinine 0.93 and GFR >60  Family Communication: Wife at bedside.  Disposition: Status is: Inpatient Remains inpatient appropriate because: Continue IV antibiotics.   Planned Discharge Destination: Home  Time spent: 50 minutes  Author: Vassie Loll, MD 05/07/2023 3:04 PM  For on call review [email or web address].

## 2023-05-08 DIAGNOSIS — D709 Neutropenia, unspecified: Secondary | ICD-10-CM | POA: Diagnosis not present

## 2023-05-08 DIAGNOSIS — J189 Pneumonia, unspecified organism: Secondary | ICD-10-CM | POA: Diagnosis not present

## 2023-05-08 DIAGNOSIS — Z720 Tobacco use: Secondary | ICD-10-CM | POA: Diagnosis not present

## 2023-05-08 DIAGNOSIS — E782 Mixed hyperlipidemia: Secondary | ICD-10-CM | POA: Diagnosis not present

## 2023-05-08 LAB — GLUCOSE, CAPILLARY
Glucose-Capillary: 172 mg/dL — ABNORMAL HIGH (ref 70–99)
Glucose-Capillary: 261 mg/dL — ABNORMAL HIGH (ref 70–99)
Glucose-Capillary: 308 mg/dL — ABNORMAL HIGH (ref 70–99)
Glucose-Capillary: 339 mg/dL — ABNORMAL HIGH (ref 70–99)

## 2023-05-08 LAB — LEGIONELLA PNEUMOPHILA SEROGP 1 UR AG: L. pneumophila Serogp 1 Ur Ag: NEGATIVE

## 2023-05-08 LAB — PATHOLOGIST SMEAR REVIEW

## 2023-05-08 MED ORDER — DOCUSATE SODIUM 100 MG PO CAPS
100.0000 mg | ORAL_CAPSULE | Freq: Two times a day (BID) | ORAL | Status: DC
  Administered 2023-05-08 – 2023-05-14 (×12): 100 mg via ORAL
  Filled 2023-05-08 (×16): qty 1

## 2023-05-08 MED ORDER — POLYETHYLENE GLYCOL 3350 17 G PO PACK
17.0000 g | PACK | Freq: Every day | ORAL | Status: DC
  Administered 2023-05-08 – 2023-05-13 (×6): 17 g via ORAL
  Filled 2023-05-08 (×8): qty 1

## 2023-05-08 MED ORDER — INSULIN GLARGINE-YFGN 100 UNIT/ML ~~LOC~~ SOLN
50.0000 [IU] | Freq: Every day | SUBCUTANEOUS | Status: DC
  Administered 2023-05-08 – 2023-05-10 (×3): 50 [IU] via SUBCUTANEOUS
  Filled 2023-05-08 (×4): qty 0.5

## 2023-05-08 NOTE — Inpatient Diabetes Management (Signed)
Inpatient Diabetes Program Recommendations  AACE/ADA: New Consensus Statement on Inpatient Glycemic Control (2015)  Target Ranges:  Prepandial:   less than 140 mg/dL      Peak postprandial:   less than 180 mg/dL (1-2 hours)      Critically ill patients:  140 - 180 mg/dL   Lab Results  Component Value Date   GLUCAP 261 (H) 05/08/2023   HGBA1C 9.4 (H) 05/06/2023    Latest Reference Range & Units 05/07/23 07:08 05/07/23 11:05 05/07/23 15:58 05/07/23 20:33 05/08/23 07:05 05/08/23 10:57  Glucose-Capillary 70 - 99 mg/dL 295 (H) 188 (H) 416 (H) 269 (H) 172 (H) 261 (H)  (H): Data is abnormally high  Diabetes history: DM2 Outpatient Diabetes medications: Toujeo  60 units at bedtime,Novolog 18-24 units tid meal coverage Current orders for Inpatient glycemic control: Semglee 40 units every day, Novolog 0-9 units tid, 0-5 units hs correction  Inpatient Diabetes Program Recommendations:   Postprandial CBGs elevated >200. Please consider: -Novolog 4-5 units tid meal coverage if eats @ least 50% meals  Thank you, Mark Foster. Urijah Raynor, RN, MSN, CDE  Diabetes Coordinator Inpatient Glycemic Control Team Team Pager [phone] (8am-5pm) 05/08/2023 12:43 PM

## 2023-05-08 NOTE — Plan of Care (Signed)
  Problem: Activity: Goal: Ability to tolerate increased activity will improve Outcome: Progressing   Problem: Coping: Goal: Ability to adjust to condition or change in health will improve Outcome: Progressing   Problem: Clinical Measurements: Goal: Ability to maintain a body temperature in the normal range will improve Outcome: Not Progressing

## 2023-05-08 NOTE — Progress Notes (Signed)
Progress Note   Patient: Mark Foster WUJ:[id number] DOB: 1956-08-06 DOA: 05/06/2023     2 DOS: the patient was seen and examined on 05/08/2023   Brief hospital admission narrative: MELVIS BELLEAU is a 67 y.o. male with medical history significant of pancreatic cancer, prior history of stroke, hypertension, hyperlipidemia, type 2 diabetes, hypothyroidism and prior hx of tobacco abuse; who presented to the emergency department secondary to mildly productive coughing spells, chills, general malaise and shortness of breath.  Patient reports symptom has been present for approximately 3 days prior to admission and worsening.  Patient expressed no sick contacts, no chest pain, no nausea, no vomiting, no dysuria, no hematuria, no new focal deficits, no melena and no hematochezia.   Patient reported also significant pain in his lower back running down his leg that have been present for the last week or so.   Workup in the ED demonstrated negative COVID PCR, chest x-ray with left lower lobe infiltrate and worsening neutropenia/thrombocytopenia.  Cultures taken, antibiotics started and case discussed with oncology service who has recommended admission to the hospital for IV antibiotics and further management.  [facility] contacted to admit patient for further treatment  Assessment and Plan: * CAP (community acquired pneumonia) -Patient reports 2-3 days of productive cough, short winded sensation, chills and general malaise. -Found with worsening neutropenia and spiking fever -Continue to follow culture result -Continue the use of cefepime and Zithromax -Maintain adequate hydration and provide as needed antipyretics -Granix x 1 provided and will follow up response in patient's neutrophil counts. -COVID PCR negative -Continue mucolytics and as needed bronchodilators.  Pancreatic adenocarcinoma (HCC) -Status post surgical resection -Continue outpatient follow-up with oncology service. -Currently not receiving  chemotherapy management. -Continue supportive care.  Neutropenic fever (HCC) -As mentioned above -Continue IV antibiotic -Follow neutrophil trends -Follow culture results.  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 -Discussed with patient regarding keeping himself tobacco free and avoid secondhand exposure.  Hypothyroidism -TSH within normal limits. -Continue Synthroid  Diabetes mellitus type 2 in nonobese (HCC) -A1c 7.2 -Continue to follow CBGs fluctuation and adjust hypoglycemic regimen as needed -Continue sliding scale insulin and Semglee has been ordered  Mixed hyperlipidemia -Continue treatment with Lipitor and Zetia -Heart healthy diet has been encouraged.  Essential hypertension, benign -Continue home antihypertensive agents -Heart healthy diet has been ordered  Left lower extremity pain -Images demonstrating no acute fracture -Continue as needed analgesics -Tylenol, oxycodone and as needed Dilaudid has been ordered.  Subjective:  Complaining of left lower extremity pain/neuropathy (slightly improved with pain medication); no wheezing, no crackles, no requiring oxygen supplementation.  Low-grade temperature appreciated overnight.  Physical Exam: Vitals:   05/08/23 0350 05/08/23 0716 05/08/23 0943 05/08/23 1311  BP: 135/68  132/65 132/69  Pulse: 98  94 95  Resp: 19  19 18   Temp: 99.7 F (37.6 C)  98.6 F (37 C) 99.1 F (37.3 C)  TempSrc: Oral  Oral Oral  SpO2: 95% 96% 95% 96%  Weight:      Height:       General exam: Alert, awake, oriented x 3; feeling slightly better.  Still complaining of left leg pain/neuropathy and had a low-grade temperature overnight. Respiratory system: Positive rhonchi, no wheezing; no using accessory muscles.  No requiring oxygen supplementation. Cardiovascular system:RRR. No rubs or gallops. Gastrointestinal system: Abdomen is obese, nondistended, soft and nontender. No organomegaly or masses  felt. Normal bowel sounds heard. Central nervous system: Alert and oriented. No focal neurological  deficits. Extremities: No cyanosis or clubbing. Skin: No cyanosis or clubbing. Psychiatry: Judgement and insight appear normal. Mood & affect appropriate.   Latest data Reviewed: CBC with differential: White blood cells 2.3, hemoglobin 11.6 and platelet count 54K; absolute neutrophil 1.1 Basic metabolic panel: Sodium 132, potassium 3.8, chloride 102, bicarb 24, BUN 14, creatinine 0.93 and GFR >60  Family Communication: Wife at bedside.  Disposition: Status is: Inpatient Remains inpatient appropriate because: Continue IV antibiotics.   Planned Discharge Destination: Home  Time spent: 50 minutes  Author: Vassie Loll, MD 05/08/2023 1:41 PM  For on call review [email or web address].

## 2023-05-08 NOTE — Progress Notes (Addendum)
Nurse at bedside patient had a bowel movement ,blood was noted in stool, Dr Gwenlyn Perking notified Plan of care on going.

## 2023-05-08 NOTE — Progress Notes (Signed)
Mobility Specialist Progress Note:    05/08/23 1430  Mobility  Activity Ambulated independently in hallway  Level of Assistance Independent  Assistive Device None  Distance Ambulated (ft) 230 ft  Range of Motion/Exercises Active;All extremities  Activity Response Tolerated well  Mobility Referral Yes  $Mobility charge 1 Mobility  Mobility Specialist Start Time (ACUTE ONLY) 1430  Mobility Specialist Stop Time (ACUTE ONLY) 1440  Mobility Specialist Time Calculation (min) (ACUTE ONLY) 10 min   Pt received in bed, wife in room, agreeable to mobility. Ind during ambulation with no AD. Tolerated well, asx throughout. Returned pt to room, all needs met.   Lawerance Bach Mobility Specialist Please contact via Special educational needs teacher or  Rehab office at [phone]

## 2023-05-08 NOTE — Care Management Important Message (Signed)
Important Message  Patient Details  Name: Mark Foster MRN: [id number] Date of Birth: 1956-07-03   Medicare Important Message Given:  N/A - LOS <3 / Initial given by admissions     Corey Harold 05/08/2023, 11:38 AM

## 2023-05-08 NOTE — Progress Notes (Signed)
   05/08/23 2012  Vitals  Temp (!) 101.8 F (38.8 C)  Temp Source Oral  BP (!) 135/58  MAP (mmHg) 78  BP Location Right Arm  BP Method Automatic  Patient Position (if appropriate) Lying  Pulse Rate 93  Pulse Rate Source Dinamap  Resp 17  MEWS COLOR  MEWS Score Color Yellow  Oxygen Therapy  SpO2 94 %  O2 Device Room Air  MEWS Score  MEWS Temp 2  MEWS Systolic 0  MEWS Pulse 0  MEWS RR 0  MEWS LOC 0  MEWS Score 2  Provider Notification  Provider Name/Title Dr. Phillips Odor  Date Provider Notified 05/08/23  Time Provider Notified 2040  Method of Notification Page  Notification Reason Other (Comment) (yellow mews)  Provider response No new orders  Date of Provider Response 05/08/23  Time of Provider Response 2045

## 2023-05-08 NOTE — Progress Notes (Signed)
Pt sleep well. Temperature fluctuated throughout the night sending into yellow MEWS. Pt given PRN acetaminophen. Temperature decreased and was green MEWS. Pt tolerated antibiotics well. No complaints of pain.

## 2023-05-09 ENCOUNTER — Inpatient Hospital Stay (HOSPITAL_COMMUNITY): Payer: PPO

## 2023-05-09 DIAGNOSIS — Z720 Tobacco use: Secondary | ICD-10-CM | POA: Diagnosis not present

## 2023-05-09 DIAGNOSIS — E782 Mixed hyperlipidemia: Secondary | ICD-10-CM | POA: Diagnosis not present

## 2023-05-09 DIAGNOSIS — D709 Neutropenia, unspecified: Secondary | ICD-10-CM | POA: Diagnosis not present

## 2023-05-09 DIAGNOSIS — J189 Pneumonia, unspecified organism: Secondary | ICD-10-CM | POA: Diagnosis not present

## 2023-05-09 LAB — BASIC METABOLIC PANEL
Anion gap: 11 (ref 5–15)
BUN: 15 mg/dL (ref 8–23)
CO2: 22 mmol/L (ref 22–32)
Calcium: 8 mg/dL — ABNORMAL LOW (ref 8.9–10.3)
Chloride: 98 mmol/L (ref 98–111)
Creatinine, Ser: 0.77 mg/dL (ref 0.61–1.24)
GFR, Estimated: >60 mL/min (ref >60)
Glucose, Bld: 213 mg/dL — ABNORMAL HIGH (ref 70–99)
Potassium: 3.7 mmol/L (ref 3.5–5.1)
Sodium: 131 mmol/L — ABNORMAL LOW (ref 135–145)

## 2023-05-09 LAB — CBC
HCT: 28.8 % — ABNORMAL LOW (ref 39.0–52.0)
Hemoglobin: 10.4 g/dL — ABNORMAL LOW (ref 13.0–17.0)
MCH: 36.4 pg — ABNORMAL HIGH (ref 26.0–34.0)
MCHC: 36.1 g/dL — ABNORMAL HIGH (ref 30.0–36.0)
MCV: 100.7 fL — ABNORMAL HIGH (ref 80.0–100.0)
Platelets: 50 10*3/uL — ABNORMAL LOW (ref 150–400)
RBC: 2.86 MIL/uL — ABNORMAL LOW (ref 4.22–5.81)
RDW: 15.9 % — ABNORMAL HIGH (ref 11.5–15.5)
WBC: 2.5 10*3/uL — ABNORMAL LOW (ref 4.0–10.5)
nRBC: 0 % (ref 0.0–0.2)

## 2023-05-09 LAB — GLUCOSE, CAPILLARY
Glucose-Capillary: 187 mg/dL — ABNORMAL HIGH (ref 70–99)
Glucose-Capillary: 262 mg/dL — ABNORMAL HIGH (ref 70–99)
Glucose-Capillary: 322 mg/dL — ABNORMAL HIGH (ref 70–99)
Glucose-Capillary: 294 mg/dL — ABNORMAL HIGH (ref 70–99)

## 2023-05-09 MED ORDER — INSULIN ASPART 100 UNIT/ML IJ SOLN
5.0000 [IU] | Freq: Three times a day (TID) | INTRAMUSCULAR | Status: DC
  Administered 2023-05-09 – 2023-05-15 (×16): 5 [IU] via SUBCUTANEOUS

## 2023-05-09 MED ORDER — LIVING WELL WITH DIABETES BOOK: Freq: Once | Status: AC

## 2023-05-09 NOTE — Progress Notes (Signed)
   05/09/23 1430  Assess: MEWS Score  Temp 98.5 F (36.9 C)  BP 129/62  MAP (mmHg) 82  Pulse Rate 83  Resp 18  SpO2 94 %  O2 Device Room Air  Assess: MEWS Score  MEWS Temp 0  MEWS Systolic 0  MEWS Pulse 0  MEWS RR 0  MEWS LOC 0  MEWS Score 0  MEWS Score Color Green  Assess: if the MEWS score is Yellow or Red  Were vital signs accurate and taken at a resting state? Yes  Does the patient meet 2 or more of the SIRS criteria? No  Does the patient have a confirmed or suspected source of infection? Yes  Assess: SIRS CRITERIA  SIRS Temperature  0  SIRS Pulse 0  SIRS Respirations  0  SIRS WBC 1  SIRS Score Sum  1

## 2023-05-09 NOTE — Progress Notes (Signed)
Progress Note   Patient: Mark Foster [id number]:[id number] DOB: 11/21/1955 DOA: 05/06/2023     3 DOS: the patient was seen and examined on 05/09/2023   Brief hospital admission narrative: Mark Foster is a 67 y.o. male with medical history significant of pancreatic cancer, prior history of stroke, hypertension, hyperlipidemia, type 2 diabetes, hypothyroidism and prior hx of tobacco abuse; who presented to the emergency department secondary to mildly productive coughing spells, chills, general malaise and shortness of breath.  Patient reports symptom has been present for approximately 3 days prior to admission and worsening.  Patient expressed no sick contacts, no chest pain, no nausea, no vomiting, no dysuria, no hematuria, no new focal deficits, no melena and no hematochezia.   Patient reported also significant pain in his lower back running down his leg that have been present for the last week or so.   Workup in the ED demonstrated negative COVID PCR, chest x-ray with left lower lobe infiltrate and worsening neutropenia/thrombocytopenia.  Cultures taken, antibiotics started and case discussed with oncology service who has recommended admission to the hospital for IV antibiotics and further management.  [facility] contacted to admit patient for further treatment  Assessment and Plan: * CAP (community acquired pneumonia) -Patient reports 2-3 days of productive cough, short winded sensation, chills and general malaise. -Found with worsening neutropenia and spiking fever -Continue to follow culture result -Continue the use of cefepime and Zithromax -Maintain adequate hydration and provide as needed antipyretics -Granix x 1 provided and will follow up response in patient's neutrophil counts. -COVID PCR negative -Continue mucolytics and as needed bronchodilators.  Pancreatic adenocarcinoma (HCC) -Status post surgical resection -Continue outpatient follow-up with oncology service. -Currently not receiving  chemotherapy management. -Continue supportive care.  Neutropenic fever (HCC) -As mentioned above -Continue IV antibiotic -Neutrophils/WBCs to stabilize after Granix infusion. -Continue to follow culture results. -Given still ongoing episodes of fever will check 2D echo.  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 -Discussed with patient regarding keeping himself tobacco free and avoid secondhand exposure.  Hypothyroidism -TSH within normal limits. -Continue Synthroid  Diabetes mellitus type 2 in nonobese (HCC) -A1c 7.2 -Continue to follow CBGs fluctuation and adjust hypoglycemic regimen as needed -Continue sliding scale insulin and Semglee has been ordered  Mixed hyperlipidemia -Continue treatment with Lipitor and Zetia -Heart healthy diet has been encouraged.  Essential hypertension, benign -Continue home antihypertensive agents -Heart healthy diet has been ordered  Left lower extremity pain -Chest x-ray without acute fractures -CT scan recommended; will follow results. -Continue as needed analgesics -Tylenol, oxycodone and as needed Dilaudid has been ordered.  Subjective:  Complaining of left lower leg and left hip pain.  Continues spiking fever; no chest pain, no nausea, no vomiting.  Good saturation on room air.  Physical Exam: Vitals:   05/09/23 0227 05/09/23 0457 05/09/23 0800 05/09/23 1430  BP: 116/85 (!) 117/55  129/62  Pulse: (!) 103 90  83  Resp: 18 17  18   Temp: 99.3 F (37.4 C) 99.7 F (37.6 C) (!) 100.5 F (38.1 C) 98.5 F (36.9 C)  TempSrc: Oral Oral  Oral  SpO2:  97%  94%  Weight:      Height:       General exam: Alert, awake, oriented x 3; patient has continued spiking fever; no chest pain, no nausea, no vomiting. Respiratory system: Positive scattered rhonchi; no using accessory muscles; no wheezing, no crackles, good saturation on room air. Cardiovascular system:RRR. No rubs or gallops;  no murmurs.  No  JVD. Gastrointestinal system: Abdomen is nondistended, soft and nontender. No organomegaly or masses felt. Normal bowel sounds heard. Central nervous system: No focal neurological deficits. Extremities: No cyanosis or clubbing; continue complaining of excruciating pain in his left lower extremity and left pelvic area. Skin: No petechiae. Psychiatry: Judgement and insight appear normal. Mood & affect appropriate.   Latest data Reviewed: CBC with differential: White blood 2.5, hemoglobin 10.4, MCV 100.7 and platelet count 50K Basic metabolic panel: Sodium 131, potassium 3.7, chloride 98, bicarb 22, glucose 213, BUN 15, creatinine 0.77 and creatinine >60  Family Communication: Wife at bedside.  Disposition: Status is: Inpatient Remains inpatient appropriate because: Continue IV antibiotics.   Planned Discharge Destination: Home  Time spent: 50 minutes  Author: Vassie Loll, MD 05/09/2023 3:10 PM  For on call review [email or web address].

## 2023-05-09 NOTE — Progress Notes (Signed)
Pharmacy Antibiotic Note  Mark Foster is a 67 y.o. male admitted on 05/06/2023 with  febrile neutropenia / PNA.  Pharmacy has been consulted for Cefepime dosing. chest x-ray with left lower lobe infiltrate .  Granix given and ANC trending up. Tmax 101.8  D# 4 of cefepime/azithromax  Plan: Continue Cefepime 2gm IV q8h Follow renal function, culture data, clinical progress.  Height: 5\' 10"  (177.8 cm) Weight: 99.8 kg (220 lb) IBW/kg (Calculated) : 73  Temp (24hrs), Avg:99.8 F (37.7 C), Min:98.6 F (37 C), Max:101.8 F (38.8 C)  Recent Labs  Lab 05/06/23 0810 05/06/23 0925 05/07/23 0408 05/09/23 0440  WBC 0.9*  --  2.3* 2.5*  CREATININE 0.80  --  0.93 0.77  LATICACIDVEN  --  1.1  --   --     Estimated Creatinine Clearance: 104.6 mL/min (by C-G formula based on SCr of 0.77 mg/dL).    Allergies  Allergen Reactions   Demerol [Meperidine Hcl] Other (See Comments)    convulsions    Antimicrobials this admission: Cefepime 9/1>> Ceftriaxone 2gm IV x 1 on 9/1 Azithromycin 9/1>> 9/6  Microbiology results: 9/1 blood: ngtd 9/1 COVID: negative  Thank you for allowing pharmacy to be a part of this patient's care.  Elder Cyphers, BS Pharm D, BCPS Clinical Pharmacist 05/09/2023 9:14 AM

## 2023-05-09 NOTE — Plan of Care (Signed)
  Problem: Activity: Goal: Ability to tolerate increased activity will improve Outcome: Progressing   Problem: Clinical Measurements: Goal: Ability to maintain a body temperature in the normal range will improve Outcome: Progressing   Problem: Respiratory: Goal: Ability to maintain adequate ventilation will improve Outcome: Progressing   

## 2023-05-09 NOTE — Plan of Care (Signed)
  Problem: Activity: Goal: Ability to tolerate increased activity will improve Outcome: Progressing   Problem: Clinical Measurements: Goal: Ability to maintain a body temperature in the normal range will improve Outcome: Progressing   Problem: Respiratory: Goal: Ability to maintain adequate ventilation will improve Outcome: Progressing Goal: Ability to maintain a clear airway will improve Outcome: Progressing   Problem: Education: Goal: Ability to describe self-care measures that may prevent or decrease complications (Diabetes Survival Skills Education) will improve Outcome: Progressing Goal: Individualized Educational Video(s) Outcome: Progressing

## 2023-05-09 NOTE — Inpatient Diabetes Management (Addendum)
Inpatient Diabetes Program Recommendations  AACE/ADA: New Consensus Statement on Inpatient Glycemic Control (2015)  Target Ranges:  Prepandial:   less than 140 mg/dL      Peak postprandial:   less than 180 mg/dL (1-2 hours)      Critically ill patients:  140 - 180 mg/dL   Lab Results  Component Value Date   GLUCAP 187 (H) 05/09/2023   HGBA1C 9.4 (H) 05/06/2023    Latest Reference Range & Units 05/08/23 07:05 05/08/23 10:57 05/08/23 16:33 05/08/23 20:14 05/09/23 07:40  Glucose-Capillary 70 - 99 mg/dL 161 (H) 096 (H) 045 (H) 339 (H) 187 (H)  (H): Data is abnormally high  Latest Reference Range & Units 10/29/22 08:35 05/06/23 11:50  Hemoglobin A1C 4.8 - 5.6 % 9.9 (H) 9.4 (H)  (H): Data is abnormally high  Diabetes history: DM2 Outpatient Diabetes medications: Toujeo  60 units at bedtime,Novolog 18-24 units tid meal coverage Current orders for Inpatient glycemic control: Semglee 40 units every day, Novolog 0-9 units tid, 0-5 units hs correction  Inpatient Diabetes Program Recommendations:   Postprandial CBGs continue to be elevated. Please consider: -Novolog 5 units tid meal coverage if eats 50%  Thank you, Darel Hong E. Macyn Shropshire, RN, MSN, CDE  Diabetes Coordinator Inpatient Glycemic Control Team Team Pager [phone] (8am-5pm) 05/09/2023 10:13 AM

## 2023-05-10 ENCOUNTER — Inpatient Hospital Stay (HOSPITAL_COMMUNITY): Payer: PPO

## 2023-05-10 DIAGNOSIS — R509 Fever, unspecified: Secondary | ICD-10-CM | POA: Diagnosis not present

## 2023-05-10 DIAGNOSIS — E782 Mixed hyperlipidemia: Secondary | ICD-10-CM | POA: Diagnosis not present

## 2023-05-10 DIAGNOSIS — Z720 Tobacco use: Secondary | ICD-10-CM | POA: Diagnosis not present

## 2023-05-10 DIAGNOSIS — D709 Neutropenia, unspecified: Secondary | ICD-10-CM | POA: Diagnosis not present

## 2023-05-10 DIAGNOSIS — J189 Pneumonia, unspecified organism: Secondary | ICD-10-CM | POA: Diagnosis not present

## 2023-05-10 LAB — ECHOCARDIOGRAM COMPLETE
AR max vel: 3.05 cm2
AV Area VTI: 3.73 cm2
AV Area mean vel: 2.92 cm2
AV Mean grad: 2.8 mmHg
AV Peak grad: 5.1 mmHg
Ao pk vel: 1.13 m/s
Area-P 1/2: 4.29 cm2
Height: 70 in
MV M vel: 0.71 m/s
MV Peak grad: 2 mmHg
S' Lateral: 3.1 cm
Weight: 3519.99 [oz_av]

## 2023-05-10 LAB — GLUCOSE, CAPILLARY
Glucose-Capillary: 185 mg/dL — ABNORMAL HIGH (ref 70–99)
Glucose-Capillary: 240 mg/dL — ABNORMAL HIGH (ref 70–99)
Glucose-Capillary: 307 mg/dL — ABNORMAL HIGH (ref 70–99)
Glucose-Capillary: 313 mg/dL — ABNORMAL HIGH (ref 70–99)

## 2023-05-10 MED ORDER — VANCOMYCIN HCL IN DEXTROSE 1-5 GM/200ML-% IV SOLN
1000.0000 mg | Freq: Two times a day (BID) | INTRAVENOUS | Status: DC
  Administered 2023-05-11: 1000 mg via INTRAVENOUS
  Filled 2023-05-10: qty 200

## 2023-05-10 MED ORDER — PERFLUTREN LIPID MICROSPHERE
1.0000 mL | INTRAVENOUS | Status: AC | PRN
  Administered 2023-05-10: 6 mL via INTRAVENOUS

## 2023-05-10 MED ORDER — VANCOMYCIN HCL 2000 MG/400ML IV SOLN
2000.0000 mg | Freq: Once | INTRAVENOUS | Status: AC
  Administered 2023-05-10: 2000 mg via INTRAVENOUS
  Filled 2023-05-10: qty 400

## 2023-05-10 NOTE — Progress Notes (Signed)
   05/10/23 1311  Assess: MEWS Score  Temp (!) 102.7 F (39.3 C)  BP (!) 144/75  MAP (mmHg) 95  Pulse Rate 96  Resp 20  Level of Consciousness Alert  SpO2 93 %  O2 Device Room Air  Assess: MEWS Score  MEWS Temp 2  MEWS Systolic 0  MEWS Pulse 0  MEWS RR 0  MEWS LOC 0  MEWS Score 2  MEWS Score Color Yellow  Assess: if the MEWS score is Yellow or Red  Were vital signs accurate and taken at a resting state? Yes  Does the patient meet 2 or more of the SIRS criteria? No  Does the patient have a confirmed or suspected source of infection? No  Provider Notification  Provider Name/Title Vassie Loll MD  Date Provider Notified 05/10/23  Time Provider Notified 1359  Notification Reason Other (Comment) (MEWS)  Provider response No new orders  Assess: SIRS CRITERIA  SIRS Temperature  1  SIRS Pulse 1  SIRS Respirations  0  SIRS WBC 0  SIRS Score Sum  2

## 2023-05-10 NOTE — Progress Notes (Signed)
Pt sleep well throughout the night. Pt complain of headache and hip pain. Pain relieved by medication. Pt had fever of 101.1 @ 0400. Acetaminophen given. Fever rechecked to 100.4.

## 2023-05-10 NOTE — Progress Notes (Signed)
  Echocardiogram 2D Echocardiogram has been performed.  Mark Foster 05/10/2023, 2:18 PM

## 2023-05-10 NOTE — Progress Notes (Signed)
This LPN  noticed dietary in the hallway after encounter with pt and his wife upset at bed side about his tray that had been delivered and assigned nursing staff attempted to wake pt to eat.. I offered assistance and to attempt to deescalate to reheat to accommodate. Pt refused. Charge informed.

## 2023-05-10 NOTE — Progress Notes (Signed)
Progress Note   Patient: ZHYON MODESTE UEA:[id number] DOB: [birth year] DOA: 05/06/2023     4 DOS: the patient was seen and examined on 05/10/2023   Brief hospital admission narrative: JOJUAN ALVERSON is a 67 y.o. male with medical history significant of pancreatic cancer, prior history of stroke, hypertension, hyperlipidemia, type 2 diabetes, hypothyroidism and prior hx of tobacco abuse; who presented to the emergency department secondary to mildly productive coughing spells, chills, general malaise and shortness of breath.  Patient reports symptom has been present for approximately 3 days prior to admission and worsening.  Patient expressed no sick contacts, no chest pain, no nausea, no vomiting, no dysuria, no hematuria, no new focal deficits, no melena and no hematochezia.   Patient reported also significant pain in his lower back running down his leg that have been present for the last week or so.   Workup in the ED demonstrated negative COVID PCR, chest x-ray with left lower lobe infiltrate and worsening neutropenia/thrombocytopenia.  Cultures taken, antibiotics started and case discussed with oncology service who has recommended admission to the hospital for IV antibiotics and further management.  [facility] contacted to admit patient for further treatment  Assessment and Plan: Sepsis secondary to CAP (community acquired pneumonia) -Patient met criteria for sepsis at time of admission. -Patient reports 2-3 days of productive cough, short winded sensation, chills and general malaise. -Found with worsening neutropenia and spiking fever -Continue to follow culture result -Continue the use of cefepime and Zithromax; vancomycin added as well to broaden antibiotics. -Continue to maintain adequate hydration and provide as needed antipyretics -Granix x 1 provided and neutropenia/absolute neutrophil counts improved and above 1000. -COVID PCR negative -Continue mucolytics and as needed bronchodilators. -CT  scan of his left lower extremity/pelvis without abnormalities. -Urinalysis, cultures and blood cultures no triggering any other microorganism or source of infection at the moment -Given concern for ongoing fever episodes no driven by pneumonia only infectious disease has been contacted and will follow recommendation as part of further workup needed.  Pancreatic adenocarcinoma (HCC) -Status post surgical resection -Continue outpatient follow-up with oncology service. -Currently not receiving chemotherapy management. -Continue supportive care.  Neutropenic fever (HCC) -Neutrophils/WBCs to stabilize after Granix infusion. -Continue to follow culture results. -Given still ongoing episodes of fever 2D echo has been ordered and will follow results -Infectious disease has been contacted for assistance with concern for FUO; will follow recommendations. -Antibiotic has been Broaden and will continue as needed antipyretics.  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 -Discussed with patient regarding keeping himself tobacco free and avoid secondhand exposure.  Hypothyroidism -TSH within normal limits. -Continue Synthroid  Diabetes mellitus type 2 in nonobese (HCC) -A1c 7.2 -Continue to follow CBGs fluctuation and adjust hypoglycemic regimen as needed -Continue sliding scale insulin and Semglee has been ordered  Mixed hyperlipidemia -Continue treatment with Lipitor and Zetia -Heart healthy diet has been encouraged.  Essential hypertension, benign -Continue home antihypertensive agents -Heart healthy diet has been ordered  Left lower extremity pain -Chest x-ray without acute fractures -CT scan demonstrating no acute abnormalities to explain patient's pain. -Continue as needed analgesics -Tylenol, oxycodone and as needed Dilaudid has been ordered.  Pancytopenia -Decrease in patient's hemoglobin most likely associated with hemodilution; no overt  bleeding. -Chronically with neutropenia and thrombocytopenia which most likely have worsened in the setting of sepsis from pneumonia and fever of unknown origin.  Subjective:  Complaining of left lower leg and left hip pain; just relieved by the  use of narcotics (patient is present no wanting to be on narcotics as much as possible).  Continue spiking fever.  No nausea, no vomiting, no dysuria or hematuria.  Physical Exam: Vitals:   05/10/23 0600 05/10/23 1029 05/10/23 1311 05/10/23 1509  BP:   (!) 144/75 135/67  Pulse:   96 (!) 106  Resp:   20 20  Temp: (!) 100.4 F (38 C) 98.1 F (36.7 C) (!) 102.7 F (39.3 C) 99.4 F (37.4 C)  TempSrc: Oral Oral Oral Oral  SpO2:   93% 94%  Weight:      Height:       General exam: Alert, awake, oriented x 3; still spiking fever (even overall fever curve improving).  No chest pain, no nausea, no vomiting, no oxygen supplementation required. Respiratory system: Good saturation on room air; positive rhonchi, no using accessory muscles. Cardiovascular system:RRR. No rubs or gallops.  No murmur on exam. Gastrointestinal system: Abdomen is nondistended, soft and nontender. No organomegaly or masses felt. Normal bowel sounds heard. Central nervous system: No focal neurological deficits. Extremities: No cyanosis or clubbing.  Continue excruciating pain in his left lower extremity. Skin: No petechiae. Psychiatry: Judgement and insight appear normal. Mood & affect appropriate.   Latest data Reviewed: CBC with differential: White blood 2.5, hemoglobin 10.4, MCV 100.7 and platelet count 50K Basic metabolic panel: Sodium 131, potassium 3.7, chloride 98, bicarb 22, glucose 213, BUN 15, creatinine 0.77 and creatinine >60  Family Communication: No family at bedside  Disposition: Status is: Inpatient Remains inpatient appropriate because: Continue IV antibiotics.   Planned Discharge Destination: Home  Time spent: 50 minutes  Author: Vassie Loll,  MD 05/10/2023 6:39 PM  For on call review [email or web address].

## 2023-05-10 NOTE — Progress Notes (Signed)
Pharmacy Antibiotic Note  Mark Foster is a 67 y.o. male admitted on 05/06/2023 with  neutropenic fever/pneumonia .  Pharmacy has been consulted for vancomycin dosing.  Plan: Vancomycin 2000 mg IV x 1 dose. Vancomycin 1000 mg IV every 12 hours. Cefepime 2000 mg IV every 8 hours. Flagyl 500 mg IV every 12 hours. Monitor labs, c/s, and vanco level as indicated.  Height: 5\' 10"  (177.8 cm) Weight: 99.8 kg (220 lb) IBW/kg (Calculated) : 73  Temp (24hrs), Avg:100.1 F (37.8 C), Min:98 F (36.7 C), Max:102.7 F (39.3 C)  Recent Labs  Lab 05/06/23 0810 05/06/23 0925 05/07/23 0408 05/09/23 0440  WBC 0.9*  --  2.3* 2.5*  CREATININE 0.80  --  0.93 0.77  LATICACIDVEN  --  1.1  --   --     Estimated Creatinine Clearance: 104.6 mL/min (by C-G formula based on SCr of 0.77 mg/dL).    Allergies  Allergen Reactions   Demerol [Meperidine Hcl] Other (See Comments)    convulsions    Antimicrobials this admission: Vanco 9/5 >> Cefepime 9/1 >> Azith 9/1 >> CTX 9/1   Microbiology results: 9/1 BCx: ngtd  Thank you for allowing pharmacy to be a part of this patient's care.  Judeth Cornfield, PharmD Clinical Pharmacist 05/10/2023 2:31 PM

## 2023-05-10 NOTE — Plan of Care (Signed)
  Problem: Respiratory: Goal: Ability to maintain a clear airway will improve Outcome: Progressing   Problem: Skin Integrity: Goal: Risk for impaired skin integrity will decrease Outcome: Progressing   Problem: Education: Goal: Knowledge of General Education information will improve Description: Including pain rating scale, medication(s)/side effects and non-pharmacologic comfort measures Outcome: Progressing

## 2023-05-11 ENCOUNTER — Inpatient Hospital Stay (HOSPITAL_COMMUNITY): Payer: PPO

## 2023-05-11 DIAGNOSIS — E782 Mixed hyperlipidemia: Secondary | ICD-10-CM | POA: Diagnosis not present

## 2023-05-11 DIAGNOSIS — Z720 Tobacco use: Secondary | ICD-10-CM | POA: Diagnosis not present

## 2023-05-11 DIAGNOSIS — J189 Pneumonia, unspecified organism: Secondary | ICD-10-CM | POA: Diagnosis not present

## 2023-05-11 DIAGNOSIS — D709 Neutropenia, unspecified: Secondary | ICD-10-CM | POA: Diagnosis not present

## 2023-05-11 LAB — C-REACTIVE PROTEIN: CRP: 9.1 mg/dL — ABNORMAL HIGH (ref <1.0)

## 2023-05-11 LAB — BASIC METABOLIC PANEL
Anion gap: 10 (ref 5–15)
BUN: 17 mg/dL (ref 8–23)
CO2: 21 mmol/L — ABNORMAL LOW (ref 22–32)
Calcium: 7.9 mg/dL — ABNORMAL LOW (ref 8.9–10.3)
Chloride: 97 mmol/L — ABNORMAL LOW (ref 98–111)
Creatinine, Ser: 0.83 mg/dL (ref 0.61–1.24)
GFR, Estimated: >60 mL/min (ref >60)
Glucose, Bld: 208 mg/dL — ABNORMAL HIGH (ref 70–99)
Potassium: 4.1 mmol/L (ref 3.5–5.1)
Sodium: 128 mmol/L — ABNORMAL LOW (ref 135–145)

## 2023-05-11 LAB — CULTURE, BLOOD (ROUTINE X 2)
Culture: NO GROWTH
Culture: NO GROWTH
Special Requests: ADEQUATE
Special Requests: ADEQUATE

## 2023-05-11 LAB — CBC
HCT: 29.7 % — ABNORMAL LOW (ref 39.0–52.0)
Hemoglobin: 10.8 g/dL — ABNORMAL LOW (ref 13.0–17.0)
MCH: 36.9 pg — ABNORMAL HIGH (ref 26.0–34.0)
MCHC: 36.4 g/dL — ABNORMAL HIGH (ref 30.0–36.0)
MCV: 101.4 fL — ABNORMAL HIGH (ref 80.0–100.0)
Platelets: 57 10*3/uL — ABNORMAL LOW (ref 150–400)
RBC: 2.93 MIL/uL — ABNORMAL LOW (ref 4.22–5.81)
RDW: 16.1 % — ABNORMAL HIGH (ref 11.5–15.5)
WBC: 2.2 10*3/uL — ABNORMAL LOW (ref 4.0–10.5)
nRBC: 0 % (ref 0.0–0.2)

## 2023-05-11 LAB — HEPATIC FUNCTION PANEL
ALT: 103 U/L — ABNORMAL HIGH (ref 0–44)
AST: 80 U/L — ABNORMAL HIGH (ref 15–41)
Albumin: 3.2 g/dL — ABNORMAL LOW (ref 3.5–5.0)
Alkaline Phosphatase: 79 U/L (ref 38–126)
Bilirubin, Direct: 0.3 mg/dL — ABNORMAL HIGH (ref 0.0–0.2)
Indirect Bilirubin: 1.3 mg/dL — ABNORMAL HIGH (ref 0.3–0.9)
Total Bilirubin: 1.6 mg/dL — ABNORMAL HIGH (ref 0.3–1.2)
Total Protein: 6.5 g/dL (ref 6.5–8.1)

## 2023-05-11 LAB — GLUCOSE, CAPILLARY
Glucose-Capillary: 190 mg/dL — ABNORMAL HIGH (ref 70–99)
Glucose-Capillary: 290 mg/dL — ABNORMAL HIGH (ref 70–99)
Glucose-Capillary: 258 mg/dL — ABNORMAL HIGH (ref 70–99)
Glucose-Capillary: 302 mg/dL — ABNORMAL HIGH (ref 70–99)

## 2023-05-11 LAB — SEDIMENTATION RATE: Sed Rate: 60 mm/h — ABNORMAL HIGH (ref 0–16)

## 2023-05-11 MED ORDER — SODIUM CHLORIDE 0.9 % IV SOLN
2.0000 g | Freq: Three times a day (TID) | INTRAVENOUS | Status: DC
  Administered 2023-05-11 – 2023-05-15 (×12): 2 g via INTRAVENOUS
  Filled 2023-05-11 (×12): qty 12.5

## 2023-05-11 MED ORDER — IOHEXOL 300 MG/ML  SOLN
100.0000 mL | Freq: Once | INTRAMUSCULAR | Status: AC | PRN
  Administered 2023-05-11: 100 mL via INTRAVENOUS

## 2023-05-11 MED ORDER — INSULIN GLARGINE-YFGN 100 UNIT/ML ~~LOC~~ SOLN
55.0000 [IU] | Freq: Every day | SUBCUTANEOUS | Status: DC
  Administered 2023-05-11 – 2023-05-14 (×4): 55 [IU] via SUBCUTANEOUS
  Filled 2023-05-11 (×5): qty 0.55

## 2023-05-11 MED ORDER — VANCOMYCIN HCL 1250 MG/250ML IV SOLN
1250.0000 mg | Freq: Two times a day (BID) | INTRAVENOUS | Status: DC
  Administered 2023-05-11 – 2023-05-13 (×4): 1250 mg via INTRAVENOUS
  Filled 2023-05-11 (×4): qty 250

## 2023-05-11 NOTE — Progress Notes (Signed)
Patient fever back to 102.9 given PRN tylenol , removed blankets , patient o2 sat 88 placed on 2L of oxygen . Notified Dr. Gwenlyn Perking,   05/11/23 1408  Assess: MEWS Score  Temp (!) 102.9 F (39.4 C)  BP 132/73  MAP (mmHg) 88  Pulse Rate 98  Resp 20  SpO2 90 %  O2 Device Room Air  Assess: MEWS Score  MEWS Temp 2  MEWS Systolic 0  MEWS Pulse 0  MEWS RR 0  MEWS LOC 0  MEWS Score 2  MEWS Score Color Yellow  Assess: if the MEWS score is Yellow or Red  Were vital signs accurate and taken at a resting state? Yes  Does the patient meet 2 or more of the SIRS criteria? Yes  Does the patient have a confirmed or suspected source of infection? Yes  MEWS guidelines implemented  Yes, yellow  Treat  MEWS Interventions Considered administering scheduled or prn medications/treatments as ordered  Take Vital Signs  Increase Vital Sign Frequency  Yellow: Q2hr x1, continue Q4hrs until patient remains green for 12hrs  Escalate  MEWS: Escalate Yellow: Discuss with charge nurse and consider notifying provider and/or RRT  Notify: Charge Nurse/RN  Name of Charge Nurse/RN Notified Brandie, RN  Provider Notification  Provider Name/Title Gwenlyn Perking  Date Provider Notified 05/11/23  Time Provider Notified 1425  Method of Notification Page  Notification Reason Other (Comment)  Assess: SIRS CRITERIA  SIRS Temperature  1  SIRS Pulse 1  SIRS Respirations  0  SIRS WBC 1  SIRS Score Sum  3

## 2023-05-11 NOTE — Progress Notes (Signed)
Progress Note   Patient: Mark Foster ZOX:[id number] DOB: 1956/04/22 DOA: 05/06/2023     5 DOS: the patient was seen and examined on 05/11/2023   Brief hospital admission narrative: Mark Foster is a 67 y.o. male with medical history significant of pancreatic cancer, prior history of stroke, hypertension, hyperlipidemia, type 2 diabetes, hypothyroidism and prior hx of tobacco abuse; who presented to the emergency department secondary to mildly productive coughing spells, chills, general malaise and shortness of breath.  Patient reports symptom has been present for approximately 3 days prior to admission and worsening.  Patient expressed no sick contacts, no chest pain, no nausea, no vomiting, no dysuria, no hematuria, no new focal deficits, no melena and no hematochezia.   Patient reported also significant pain in his lower back running down his leg that have been present for the last week or so.   Workup in the ED demonstrated negative COVID PCR, chest x-ray with left lower lobe infiltrate and worsening neutropenia/thrombocytopenia.  Cultures taken, antibiotics started and case discussed with oncology service who has recommended admission to the hospital for IV antibiotics and further management.  [facility] contacted to admit patient for further treatment  Assessment and Plan: Sepsis secondary to CAP (community acquired pneumonia) -Patient met criteria for sepsis at time of admission. -Patient reports 2-3 days of productive cough, short winded sensation, chills and general malaise. -Found with worsening neutropenia and spiking fever -Continue to follow culture result -Continue the use of cefepime and Zithromax; vancomycin added as well to broaden antibiotics. -Continue to maintain adequate hydration and provide as needed antipyretics -Granix x 1 provided and neutropenia/absolute neutrophil counts improved and above 1000. -COVID PCR negative -Continue mucolytics and as needed bronchodilators. -CT  scan of his left lower extremity/pelvis without abnormalities. -Urinalysis, cultures and blood cultures no triggering any other microorganism or source of infection at the moment -Given concern for ongoing fever episodes no driven by pneumonia only, infectious disease (Dr. Drue Second) has been contacted and will follow recommendation as part of further workup needed. -CT chest, abdomen and pelvis has been ordered.  Pancreatic adenocarcinoma (HCC) -Status post surgical resection -Continue outpatient follow-up with oncology service. -Currently not receiving chemotherapy management. -Continue supportive care.  Neutropenic fever (HCC) -Neutrophils/WBCs to stabilize after Granix infusion. -Continue to follow culture results. -Given still ongoing episodes of fever 2D echo has been ordered and will follow results -Infectious disease has been contacted for assistance with concern for FUO; will follow recommendations. -Antibiotic has been Broaden and will continue as needed antipyretics.  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 -Discussed with patient regarding keeping himself tobacco free and avoid secondhand exposure.  Hypothyroidism -TSH within normal limits. -Continue Synthroid  Diabetes mellitus type 2 in nonobese (HCC) -A1c 7.2 -Continue to follow CBGs fluctuation and adjust hypoglycemic regimen as needed -Continue sliding scale insulin and Semglee has been ordered  Mixed hyperlipidemia -Continue treatment with Lipitor and Zetia -Heart healthy diet has been encouraged.  Essential hypertension, benign -Continue home antihypertensive agents -Heart healthy diet has been ordered  Left lower extremity pain -Chest x-ray without acute fractures -CT scan demonstrating no acute abnormalities to explain patient's pain. -Continue as needed analgesics -Tylenol, oxycodone and as needed Dilaudid has been ordered.  Pancytopenia -Decrease in patient's  hemoglobin most likely associated with hemodilution; no overt bleeding. -Chronically with neutropenia and thrombocytopenia which most likely have worsened in the setting of sepsis from pneumonia and fever of unknown origin. -Continue supportive care and follow  blood counts trend.  Subjective:  No chest pain, no nausea, no vomiting, no dysuria, no hematuria, no melena.  Patient continued complaining of pain in his left lower extremity and has continue experiencing intermittent spiking fever episodes.  Physical Exam: Vitals:   05/11/23 0759 05/11/23 1156 05/11/23 1408 05/11/23 1430  BP:   132/73   Pulse:   98   Resp:  18 20   Temp:  (!) 100.5 F (38.1 C) (!) 102.9 F (39.4 C)   TempSrc:  Axillary Oral   SpO2: 90%  (!) 88% 90%  Weight:      Height:       General exam: Alert, awake, oriented x 3; still spiking fever and complaining of left lower extremity pain. Respiratory system: 2 L nasal cannula supplementation place secondary to mild hypoxia on today's examination.  Positive rhonchi bilaterally; no using accessory muscle, no tachypnea. Cardiovascular system:RRR. No rubs or gallops; no JVD. Gastrointestinal system: Abdomen is obese, nondistended, soft and nontender. No organomegaly or masses felt. Normal bowel sounds heard. Central nervous system: Alert and oriented. No focal neurological deficits. Extremities: No cyanosis or clubbing; trace edema bilaterally. Skin: No petechiae. Psychiatry: Judgement and insight appear normal. Mood & affect appropriate.   Latest data Reviewed: Basic metabolic panel: Sodium 128, potassium 4.1, chloride 97, bicarb 21, glucose 208, BUN 17, creatinine 0.83 and GFR >60 CBC: WBCs 2.2, hemoglobin 10.8 and platelet count 57K  Family Communication: No family at bedside  Disposition: Status is: Inpatient Remains inpatient appropriate because: Continue IV antibiotics.   Planned Discharge Destination: Home  Time spent: 50 minutes  Author: Vassie Loll, MD 05/11/2023 2:32 PM  For on call review [email or web address].

## 2023-05-11 NOTE — Progress Notes (Signed)
Pharmacy Antibiotic Note  Mark Foster is a 67 y.o. male admitted on 05/06/2023 with  neutropenic fever/pneumonia .  PMH includes pancreatic cancer, stroke, HTN, HLD, T2DM, hypothyroid, and tobacco use. Patient not currently receiving chemotherapy. Patient with worsening neutropenia and spiking fever, and all imaging has been negative so far, so infectious diseases to evaluate patient. Pharmacy has been consulted for cefepime and vancomycin dosing.  Plan: Change vancomycin to 1250 mg IV q12H eAUC 450, Cmax 29, Cmin 13 Scr 0.83, IBW, Vd 0.72 Continue cefepime 2 g IV q8H Follow up culture results to assess for antibiotic optimization. Monitor renal function to assess for any necessary antibiotic dosing changes.  Height: 5\' 10"  (177.8 cm) Weight: 99.8 kg (220 lb) IBW/kg (Calculated) : 73  Temp (24hrs), Avg:100.8 F (38.2 C), Min:99.1 F (37.3 C), Max:102.9 F (39.4 C)  Recent Labs  Lab 05/06/23 0810 05/06/23 0925 05/07/23 0408 05/09/23 0440 05/11/23 0431 05/11/23 0540  WBC 0.9*  --  2.3* 2.5*  --  2.2*  CREATININE 0.80  --  0.93 0.77 0.83  --   LATICACIDVEN  --  1.1  --   --   --   --     Estimated Creatinine Clearance: 100.8 mL/min (by C-G formula based on SCr of 0.83 mg/dL).    Allergies  Allergen Reactions   Demerol [Meperidine Hcl] Other (See Comments)    convulsions    Antimicrobials this admission: Vanco 9/5 >> Cefepime 9/1 >> Azithromycin 9/1 >> 9/6 CTX 9/1 x 1  Microbiology results: 9/1 BCx: NGF  Thank you for allowing pharmacy to be a part of this patient's care.  Will M. Dareen Piano, PharmD Clinical Pharmacist 05/11/2023 11:32 AM

## 2023-05-11 NOTE — Care Management Important Message (Signed)
Important Message  Patient Details  Name: Mark Foster MRN: [id number] Date of Birth: February 23, 1956   Medicare Important Message Given:  Yes (spoke with wife [name] at [phone] to explain letter, no additional copy needed at this time)     Corey Harold 05/11/2023, 12:24 PM

## 2023-05-11 NOTE — Progress Notes (Signed)
MEWS yellow due to previously recorded RR with temp of 100.5 his RR at this time is 18 and therefore turned MEWS green,. Not time for tylenol , encouraged patient to remove the thick blankets on him and also to turn heat down a little.     05/11/23 1156  Assess: MEWS Score  Temp (!) 100.5 F (38.1 C)  Resp 18  Level of Consciousness Alert  Assess: MEWS Score  MEWS Temp 1  MEWS Systolic 0  MEWS Pulse 0  MEWS RR 0  MEWS LOC 0  MEWS Score 1  MEWS Score Color Green  Assess: SIRS CRITERIA  SIRS Temperature  0  SIRS Pulse 0  SIRS Respirations  0  SIRS WBC 1  SIRS Score Sum  1

## 2023-05-11 NOTE — Consult Note (Signed)
[facility]  Total days of antibiotics 6       Reason for Consult: FUO    Referring Physician: Dionne Milo  Principal Problem:   CAP (community acquired pneumonia) Active Problems:   Essential hypertension, benign   Mixed hyperlipidemia   Diabetes (HCC)   Diabetes mellitus type 2 in nonobese (HCC)   Hypothyroidism   Pancreatic adenocarcinoma (HCC)   Tobacco abuse   Neutropenic fever (HCC)    HPI: Mark Foster is a 67 y.o. 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 He received vaccinations piror to splenectomy. On Q 6 months of CTAP and labs. Has mild cytopenias since chemo with BL wbc of 2.5, plt 80s and anemia. He was admitted on 9/1 with 3 day history of cough and fevers, chills, . Lab showed wbc of 0.9plt 58, tbili 1.3, started on cefepime and azithromycin. Still having unrelenting fevers. Did receive 1 dose of gcsf to help nuetropenia.ID asked to see regarding recs for FUO work up. Patient last hospitalized in feb for uti- sepsis  Testing includes: Neg covid Neg RVP Neg blood cx Ua neg Ct of left hip negative for significant degenerative changes Cxr= LLL iniftrate per my read TTE neg   Past Medical History:  Diagnosis Date   Bilateral carpal tunnel syndrome 10/19/2020   CAD (coronary artery disease)    a. 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   Type 2 diabetes mellitus (HCC)     Allergies:  Allergies  Allergen Reactions    Demerol [Meperidine Hcl] Other (See Comments)    convulsions     MEDICATIONS:  aspirin EC  81 mg Oral Q breakfast   atorvastatin  80 mg Oral Daily   budesonide (PULMICORT) nebulizer solution  0.5 mg Nebulization BID   cholecalciferol  1,000 Units Oral Daily   cyanocobalamin  1,000 mcg Oral Daily   dextromethorphan-guaiFENesin  1 tablet Oral BID   docusate sodium  100 mg Oral BID   ezetimibe  10 mg Oral Daily   gabapentin  300 mg Oral TID   insulin aspart  0-5 Units Subcutaneous QHS   insulin aspart  0-9 Units Subcutaneous TID WC   insulin aspart  5 Units Subcutaneous TID WC   insulin glargine-yfgn  55 Units Subcutaneous QHS   levothyroxine  50 mcg Oral Daily   metoprolol succinate  25 mg Oral Daily   polyethylene glycol  17 g Oral Daily   tamsulosin  0.4 mg Oral QPC supper   zinc sulfate  220 mg Oral Daily    Social History   Tobacco Use   Smoking status: Former    Current packs/day: 0.25    Average packs/day: 0.3 packs/day for 3.6 years (0.9 ttl pk-yrs)    Types: Cigarettes    Start date: 10/06/2019   Smokeless tobacco: Never  Vaping Use   Vaping status: Never Used  Substance  Use Topics   Alcohol use: Not Currently    Alcohol/week: 1.0 standard drink of alcohol    Types: 1 Cans of beer per week   Drug use: Never    Family History  Problem Relation Age of Onset   Stroke Mother    Pancreatic cancer Father 38       d. 49   Stroke Maternal Grandmother    Heart attack Maternal Grandfather    Heart Problems Paternal Grandfather    Scoliosis Daughter    Muscular dystrophy Grandson     Review of Systems - did not see  OBJECTIVE: Temp:  [99.1 F (37.3 C)-102.9 F (39.4 C)] 102.9 F (39.4 C) (09/06 1408) Pulse Rate:  [74-98] 98 (09/06 1408) Resp:  [18-22] 20 (09/06 1408) BP: (105-154)/(64-76) 132/73 (09/06 1408) SpO2:  [88 %-96 %] 90 % (09/06 1430)   LABS: Results for orders placed or performed during the hospital encounter of 05/06/23 (from the past 48  hour(s))  Glucose, capillary     Status: Abnormal   Collection Time: 05/09/23  4:02 PM  Result Value Ref Range   Glucose-Capillary 322 (H) 70 - 99 mg/dL    Comment: Glucose reference range applies only to samples taken after fasting for at least 8 hours.  Glucose, capillary     Status: Abnormal   Collection Time: 05/09/23 10:47 PM  Result Value Ref Range   Glucose-Capillary 294 (H) 70 - 99 mg/dL    Comment: Glucose reference range applies only to samples taken after fasting for at least 8 hours.  Glucose, capillary     Status: Abnormal   Collection Time: 05/10/23  7:37 AM  Result Value Ref Range   Glucose-Capillary 185 (H) 70 - 99 mg/dL    Comment: Glucose reference range applies only to samples taken after fasting for at least 8 hours.  Glucose, capillary     Status: Abnormal   Collection Time: 05/10/23 11:23 AM  Result Value Ref Range   Glucose-Capillary 240 (H) 70 - 99 mg/dL    Comment: Glucose reference range applies only to samples taken after fasting for at least 8 hours.  Glucose, capillary     Status: Abnormal   Collection Time: 05/10/23  4:58 PM  Result Value Ref Range   Glucose-Capillary 307 (H) 70 - 99 mg/dL    Comment: Glucose reference range applies only to samples taken after fasting for at least 8 hours.  Glucose, capillary     Status: Abnormal   Collection Time: 05/10/23  8:53 PM  Result Value Ref Range   Glucose-Capillary 313 (H) 70 - 99 mg/dL    Comment: Glucose reference range applies only to samples taken after fasting for at least 8 hours.  Basic metabolic panel     Status: Abnormal   Collection Time: 05/11/23  4:31 AM  Result Value Ref Range   Sodium 128 (L) 135 - 145 mmol/L   Potassium 4.1 3.5 - 5.1 mmol/L   Chloride 97 (L) 98 - 111 mmol/L   CO2 21 (L) 22 - 32 mmol/L   Glucose, Bld 208 (H) 70 - 99 mg/dL    Comment: Glucose reference range applies only to samples taken after fasting for at least 8 hours.   BUN 17 8 - 23 mg/dL   Creatinine, Ser 9.56 0.61  - 1.24 mg/dL   Calcium 7.9 (L) 8.9 - 10.3 mg/dL   GFR, Estimated >38 >75 mL/min    Comment: (NOTE) Calculated using the CKD-EPI Creatinine Equation (2021)  Anion gap 10 5 - 15    Comment: Performed at [facility], [address] [place], [place] [address]  CBC     Status: Abnormal   Collection Time: 05/11/23  5:40 AM  Result Value Ref Range   WBC 2.2 (L) 4.0 - 10.5 K/uL   RBC 2.93 (L) 4.22 - 5.81 MIL/uL   Hemoglobin 10.8 (L) 13.0 - 17.0 g/dL   HCT 60.4 (L) 54.0 - 98.1 %   MCV 101.4 (H) 80.0 - 100.0 fL   MCH 36.9 (H) 26.0 - 34.0 pg   MCHC 36.4 (H) 30.0 - 36.0 g/dL   RDW 19.1 (H) 47.8 - 29.5 %   Platelets 57 (L) 150 - 400 K/uL    Comment: SPECIMEN CHECKED FOR CLOTS Immature Platelet Fraction may be clinically indicated, consider ordering this additional test [id number] CONSISTENT WITH PREVIOUS RESULT REPEATED TO VERIFY    nRBC 0.0 0.0 - 0.2 %    Comment: Performed at [facility], [address], [place], [place] [address]  Glucose, capillary     Status: Abnormal   Collection Time: 05/11/23  7:29 AM  Result Value Ref Range   Glucose-Capillary 190 (H) 70 - 99 mg/dL    Comment: Glucose reference range applies only to samples taken after fasting for at least 8 hours.    MICRO: reviewed IMAGING: ECHOCARDIOGRAM COMPLETE  Result Date: 05/10/2023    ECHOCARDIOGRAM REPORT   Patient Name:   Mark Foster Date of Exam: 05/10/2023 Medical Rec #:  [id number]      Height:       70.0 in Accession #:    [id number]     Weight:       220.0 lb Date of Birth:  1955/09/22       BSA:          2.174 m Patient Age:    67 years       BP:           144/74 mmHg Patient Gender: M              HR:           97 bpm. Exam Location:  Inpatient Procedure: 2D Echo, Cardiac Doppler, Color Doppler and Intracardiac            Opacification Agent Indications:    Fever R50.9  History:        Patient has prior history of Echocardiogram examinations, most                 recent 11/12/2018. CAD, CAP and Carotid Disease;  Risk                 Factors:Hypertension, Dyslipidemia, Diabetes and Former Smoker.  Sonographer:    Aron Baba Referring Phys: [phone] CARLOS MADERA  Sonographer Comments: Technically difficult study due to poor echo windows. IMPRESSIONS  1. Left ventricular ejection fraction, by estimation, is 60 to 65%. The left ventricle has normal function. The left ventricle has no regional wall motion abnormalities. Left ventricular diastolic parameters are consistent with Grade I diastolic dysfunction (impaired relaxation).  2. Right ventricular systolic function is normal. The right ventricular size is normal. Tricuspid regurgitation signal is inadequate for assessing PA pressure.  3. Left atrial size was mildly dilated.  4. The mitral valve is normal in structure. No evidence of mitral valve regurgitation. No evidence of mitral stenosis.  5. The aortic valve has an indeterminant number of cusps. Aortic valve regurgitation is not visualized. No  aortic stenosis is present. FINDINGS  Left Ventricle: Left ventricular ejection fraction, by estimation, is 60 to 65%. The left ventricle has normal function. The left ventricle has no regional wall motion abnormalities. The left ventricular internal cavity size was normal in size. There is  no left ventricular hypertrophy. Left ventricular diastolic parameters are consistent with Grade I diastolic dysfunction (impaired relaxation). Normal left ventricular filling pressure. Right Ventricle: The right ventricular size is normal. Right vetricular wall thickness was not well visualized. Right ventricular systolic function is normal. Tricuspid regurgitation signal is inadequate for assessing PA pressure. Left Atrium: Left atrial size was mildly dilated. Right Atrium: Right atrial size was normal in size. Pericardium: There is no evidence of pericardial effusion. Mitral Valve: The mitral valve is normal in structure. There is mild thickening of the mitral valve leaflet(s). There is mild  calcification of the mitral valve leaflet(s). Mild mitral annular calcification. No evidence of mitral valve regurgitation. No evidence of mitral valve stenosis. Tricuspid Valve: The tricuspid valve is normal in structure. Tricuspid valve regurgitation is not demonstrated. No evidence of tricuspid stenosis. Aortic Valve: The aortic valve has an indeterminant number of cusps. Aortic valve regurgitation is not visualized. No aortic stenosis is present. Aortic valve mean gradient measures 2.8 mmHg. Aortic valve peak gradient measures 5.1 mmHg. Aortic valve area, by VTI measures 3.73 cm. Pulmonic Valve: The pulmonic valve was not well visualized. Pulmonic valve regurgitation is not visualized. No evidence of pulmonic stenosis. Aorta: The aortic root is normal in size and structure and the ascending aorta was not well visualized. Venous: The inferior vena cava was not well visualized. IAS/Shunts: The interatrial septum was not well visualized.  LEFT VENTRICLE PLAX 2D LVIDd:         5.00 cm   Diastology LVIDs:         3.10 cm   LV e' medial:    10.70 cm/s LV PW:         0.90 cm   LV E/e' medial:  7.3 LV IVS:        0.70 cm   LV e' lateral:   16.60 cm/s LVOT diam:     2.40 cm   LV E/e' lateral: 4.7 LV SV:         71 LV SV Index:   33 LVOT Area:     4.52 cm  RIGHT VENTRICLE RV S prime:     13.40 cm/s TAPSE (M-mode): 1.7 cm LEFT ATRIUM           Index        RIGHT ATRIUM          Index LA diam:      4.50 cm 2.07 cm/m   RA Area:     9.98 cm LA Vol (A2C): 26.9 ml 12.38 ml/m  RA Volume:   14.60 ml 6.72 ml/m LA Vol (A4C): 78.1 ml 35.93 ml/m  AORTIC VALVE AV Area (Vmax):    3.05 cm AV Area (Vmean):   2.92 cm AV Area (VTI):     3.73 cm AV Vmax:           113.40 cm/s AV Vmean:          77.936 cm/s AV VTI:            0.191 m AV Peak Grad:      5.1 mmHg AV Mean Grad:      2.8 mmHg LVOT Vmax:         76.40 cm/s LVOT Vmean:  50.300 cm/s LVOT VTI:          0.158 m LVOT/AV VTI ratio: 0.83  AORTA Ao Root diam: 3.70 cm  MITRAL VALVE MV Area (PHT): 4.29 cm     SHUNTS MV Decel Time: 177 msec     Systemic VTI:  0.16 m MR Peak grad: 2.0 mmHg      Systemic Diam: 2.40 cm MR Vmax:      70.60 cm/s MV E velocity: 77.70 cm/s MV A velocity: 113.00 cm/s MV E/A ratio:  0.69 Dina Rich MD Electronically signed by Dina Rich MD Signature Date/Time: 05/10/2023/2:34:12 PM    Final    CT HIP LEFT WO CONTRAST  Result Date: 05/09/2023 CLINICAL DATA:  Left hip pain radiating down the left leg for the past week. No injury. EXAM: CT OF THE LEFT HIP WITHOUT CONTRAST TECHNIQUE: Multidetector CT imaging of the left hip was performed according to the standard protocol. Multiplanar CT image reconstructions were also generated. RADIATION DOSE REDUCTION: This exam was performed according to the departmental dose-optimization program which includes automated exposure control, adjustment of the mA and/or kV according to patient size and/or use of iterative reconstruction technique. COMPARISON:  Left hip x-rays dated May 06, 2023. CT abdomen pelvis dated December 19, 2022. FINDINGS: Bones/Joint/Cartilage No fracture or dislocation. Joint spaces are preserved. No joint effusion. Ligaments Ligaments are suboptimally evaluated by CT. Muscles and Tendons Grossly intact. Soft tissue No fluid collection or hematoma.  No soft tissue mass. Prostatomegaly with distended bladder. Left lateral bladder diverticulum. IMPRESSION: 1. No acute osseous abnormality or significant degenerative changes. Electronically Signed   By: Obie Dredge M.D.   On: 05/09/2023 16:36     Assessment/Plan:  67yo M with hx of pancreatic ca s/p resection, and splenectomy, now in remission, with myelosuppression at baseline with wbc of 2.5 and plt of 70-90s who is admitted for symptoms consistent with pneumonia. Started on and cefepime and azithromycin. Completed 5 days of azithromycin but unchanged fever curve. Started on vancomycin yesterday.  - recommend to check MRSA screen  if concern for pneumonia - will check sed rate, and crp and hepatic panel, will check CMV vL - recommend repeat chest abd pelvis CT - wbc now back at baseline. - repeat blood cx - if no improvement with vanco in 48hrs would discontinue -awaiting repeat imaging results to help provide further recs

## 2023-05-11 NOTE — Inpatient Diabetes Management (Signed)
Inpatient Diabetes Program Recommendations  AACE/ADA: New Consensus Statement on Inpatient Glycemic Control   Target Ranges:  Prepandial:   less than 140 mg/dL      Peak postprandial:   less than 180 mg/dL (1-2 hours)      Critically ill patients:  140 - 180 mg/dL    Latest Reference Range & Units 05/10/23 07:37 05/10/23 11:23 05/10/23 16:58 05/10/23 20:53 05/11/23 07:29  Glucose-Capillary 70 - 99 mg/dL 409 (H) 811 (H) 914 (H) 313 (H) 190 (H)   Review of Glycemic Control  Diabetes history: DM2 Outpatient Diabetes medications: Toujeo 60 units at bedtime, Novolog 18-24 units TID with meals Current orders for Inpatient glycemic control: Semglee 55 units at bedtime, Novolog 0-9 units TID with meals, Novolog 0-5 units at bedtime, Novolog 5 units TID with meals  Inpatient Diabetes Program Recommendations:    Insulin: Noted Semglee increased from 50 to 55 units at bedtime. If post prandial glucose remains consistently over 180 mg/dl, please consider increasing meal coverage to Novolog 9 units TID with meals.  Thanks, Orlando Penner, RN, MSN, CDCES Diabetes Coordinator Inpatient Diabetes Program [phone] (Team Pager from 8am to 5pm)

## 2023-05-12 DIAGNOSIS — Z720 Tobacco use: Secondary | ICD-10-CM | POA: Diagnosis not present

## 2023-05-12 DIAGNOSIS — D709 Neutropenia, unspecified: Secondary | ICD-10-CM | POA: Diagnosis not present

## 2023-05-12 DIAGNOSIS — E782 Mixed hyperlipidemia: Secondary | ICD-10-CM | POA: Diagnosis not present

## 2023-05-12 DIAGNOSIS — J189 Pneumonia, unspecified organism: Secondary | ICD-10-CM | POA: Diagnosis not present

## 2023-05-12 LAB — GLUCOSE, CAPILLARY
Glucose-Capillary: 266 mg/dL — ABNORMAL HIGH (ref 70–99)
Glucose-Capillary: 305 mg/dL — ABNORMAL HIGH (ref 70–99)
Glucose-Capillary: 305 mg/dL — ABNORMAL HIGH (ref 70–99)
Glucose-Capillary: 216 mg/dL — ABNORMAL HIGH (ref 70–99)

## 2023-05-12 LAB — MRSA NEXT GEN BY PCR, NASAL: MRSA by PCR Next Gen: NOT DETECTED

## 2023-05-12 LAB — CREATININE, SERUM
Creatinine, Ser: 0.91 mg/dL (ref 0.61–1.24)
GFR, Estimated: >60 mL/min (ref >60)

## 2023-05-12 MED ORDER — ORAL CARE MOUTH RINSE: 15.0000 mL | OROMUCOSAL | Status: DC | PRN

## 2023-05-12 NOTE — Progress Notes (Signed)
Attempted to give pt 2200 meds and pt refused. He stated "Please just leave me alone." Pt withdrawn and not answering questions.

## 2023-05-12 NOTE — Progress Notes (Signed)
Pt reported pain multiple times during the night. During pain reassessment this a.m., pt stated "It doesn't even matter anymore, it's constant." Pt has a temp of 100.8 this a.m., PRN Tylenol given. Temp down to 98.8.

## 2023-05-12 NOTE — Progress Notes (Signed)
Progress Note   Patient: Mark Foster ZOX:[id number] DOB: May 31, 1956 DOA: 05/06/2023     6 DOS: the patient was seen and examined on 05/12/2023   Brief hospital admission narrative: Mark Foster is a 67 y.o. male with medical history significant of pancreatic cancer, prior history of stroke, hypertension, hyperlipidemia, type 2 diabetes, hypothyroidism and prior hx of tobacco abuse; who presented to the emergency department secondary to mildly productive coughing spells, chills, general malaise and shortness of breath.  Patient reports symptom has been present for approximately 3 days prior to admission and worsening.  Patient expressed no sick contacts, no chest pain, no nausea, no vomiting, no dysuria, no hematuria, no new focal deficits, no melena and no hematochezia.   Patient reported also significant pain in his lower back running down his leg that have been present for the last week or so.   Workup in the ED demonstrated negative COVID PCR, chest x-ray with left lower lobe infiltrate and worsening neutropenia/thrombocytopenia.  Cultures taken, antibiotics started and case discussed with oncology service who has recommended admission to the hospital for IV antibiotics and further management.  [facility] contacted to admit patient for further treatment  Assessment and Plan: Sepsis secondary to CAP (community acquired pneumonia) -Patient met criteria for sepsis at time of admission. -Patient reports 2-3 days of productive cough, short winded sensation, chills and general malaise. -Found with worsening neutropenia and spiking fever -Continue to follow culture result -Continue the use of cefepime and Zithromax; vancomycin added as well to broaden antibiotics. -Continue to maintain adequate hydration and provide as needed antipyretics -Granix x 1 provided and neutropenia/absolute neutrophil counts improved and above 1000. -COVID PCR negative -Continue mucolytics and as needed bronchodilators. -CT  scan of his left lower extremity/pelvis without abnormalities. -Urinalysis, cultures and blood cultures no triggering any other microorganism or source of infection at the moment -Given concern for ongoing fever episodes no driven by pneumonia only, infectious disease (Dr. Drue Second) has been contacted and will follow recommendation as part of further workup needed. -Blood culture has been repeated.  Pancreatic adenocarcinoma (HCC) -Status post surgical resection -Continue outpatient follow-up with oncology service. -Currently not receiving chemotherapy management. -Continue supportive care.  Neutropenic fever (HCC) -Neutrophils/WBCs to stabilize after Granix infusion. -Continue to follow culture results. -Given still ongoing episodes of fever 2D echo has been ordered and will follow results -Infectious disease has been contacted for assistance with concern for FUO; will follow any further recommendations. -Antibiotic has been Broaden and will continue as needed antipyretics. -CT chest, abdomen and pelvis has failed to demonstrate acute process to explain ongoing febrile illness.  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 -Discussed with patient regarding keeping himself tobacco free and avoid secondhand exposure.  Hypothyroidism -TSH within normal limits. -Continue Synthroid  Diabetes mellitus type 2 in nonobese (HCC) -A1c 7.2 -Continue to follow CBGs fluctuation and adjust hypoglycemic regimen as needed -Continue sliding scale insulin and Semglee has been ordered  Mixed hyperlipidemia -Continue treatment with Lipitor and Zetia -Heart healthy diet has been encouraged.  Essential hypertension, benign -Continue home antihypertensive agents -Heart healthy diet has been ordered  Left lower extremity pain -Chest x-ray without acute fractures -CT scan demonstrating no acute abnormalities to explain patient's pain. -Continue as needed  analgesics -Tylenol, oxycodone and as needed Dilaudid has been ordered.  Pancytopenia -Decrease in patient's hemoglobin most likely associated with hemodilution; no overt bleeding. -Chronically with neutropenia and thrombocytopenia which most likely have worsened in the  setting of sepsis from pneumonia and fever of unknown origin. -Continue supportive care and follow blood counts trend.  Subjective:  Patient reporting chills, low-grade temp overnight.  No chest pain, no nausea, no vomiting.  Currently with good saturation on room air and complaining of not feeling well and having pain in his left lower extremity.  Physical Exam: Vitals:   05/12/23 0739 05/12/23 1210 05/12/23 1539 05/12/23 1722  BP: 126/71  137/69   Pulse:  94 93   Resp:      Temp: 98.2 F (36.8 C) (!) 100.9 F (38.3 C) 99.3 F (37.4 C) (!) 103 F (39.4 C)  TempSrc: Oral Oral Oral   SpO2:  93% 92%   Weight:      Height:       General exam: Alert, awake, oriented x 3; reporting chills and general malaise.  Left leg pain continue to bother him; no nausea, no vomiting, no chest pain.  Good saturation on room air. Respiratory system: No using accessory muscle.  Positive rhonchi bilaterally. Cardiovascular system:RRR. No murmurs, rubs, gallops.  No JVD. Gastrointestinal system: Abdomen is nondistended, soft and nontender. No organomegaly or masses felt. Normal bowel sounds heard. Central nervous system:No focal neurological deficits. Extremities: No cyanosis or clubbing.  Decreased range of motion in the left lower extremity secondary to pain. Skin: No petechiae. Psychiatry: Judgement and insight appear normal. Mood & affect appropriate.   Latest data Reviewed: Serum creatinine: 0.91 -GFR >60 MRSA PCR not detected CRP 9.1 Sed rate: 60 Fungitell beta D glucan: Pending CMV DNA quantitative PCR: Pending   Family Communication: Wife at bedside on 05/11/2023; no family at bedside during today's evaluation  (05/12/23).  Disposition: Status is: Inpatient Remains inpatient appropriate because: Continue IV antibiotics.   Planned Discharge Destination: Home  Time spent: 50 minutes  Author: Vassie Loll, MD 05/12/2023 5:39 PM  For on call review [email or web address].

## 2023-05-12 NOTE — Plan of Care (Signed)

## 2023-05-12 NOTE — Evaluation (Signed)
Physical Therapy Evaluation Patient Details Name: Mark Foster MRN: [id number] DOB: Jan 18, 1956 Today's Date: 05/12/2023  History of Present Illness  Mark Foster is a 67 y.o. male with medical history significant of pancreatic cancer, prior history of stroke, hypertension, hyperlipidemia, type 2 diabetes, hypothyroidism and prior hx of tobacco abuse; who presented to the emergency department secondary to mildly productive coughing spells, chills, general malaise and shortness of breath.  Patient reports symptom has been present for approximately 3 days prior to admission and worsening.  Patient expressed no sick contacts, no chest pain, no nausea, no vomiting, no dysuria, no hematuria, no new focal deficits, no melena and no hematochezia.     Patient reported also significant pain in his lower back running down his leg that have been present for the last week or so.   Clinical Impression  Patient functioning near baseline for functional mobility and gait demonstrating good return for bed mobility, transfers and ambulating in room/hallway without loss of balance.  Patient encouraged to ambulate with family, nursing staff as tolerated for length of stay and wear mask when out of room.  Plan:  Patient discharged from physical therapy to care of nursing for ambulation daily as tolerated for length of stay.          If plan is discharge home, recommend the following: A little help with walking and/or transfers;A little help with bathing/dressing/bathroom;Help with stairs or ramp for entrance;Assistance with cooking/housework   Can travel by private vehicle        Equipment Recommendations None recommended by PT  Recommendations for Other Services       Functional Status Assessment Patient has had a recent decline in their functional status and/or demonstrates limited ability to make significant improvements in function in a reasonable and predictable amount of time     Precautions /  Restrictions Precautions Precautions: None Restrictions Weight Bearing Restrictions: No      Mobility  Bed Mobility Overal bed mobility: Modified Independent             General bed mobility comments: HOB raised    Transfers Overall transfer level: Modified independent                      Ambulation/Gait Ambulation/Gait assistance: Modified independent (Device/Increase time) Gait Distance (Feet): 85 Feet Assistive device: None Gait Pattern/deviations: Decreased step length - right, Decreased step length - left, Decreased stride length Gait velocity: slightly decreased     General Gait Details: slightly labored cadence with good return for ambulating in room, hallway without loss of balance  Stairs            Wheelchair Mobility     Tilt Bed    Modified Rankin (Stroke Patients Only)       Balance Overall balance assessment: No apparent balance deficits (not formally assessed)                                           Pertinent Vitals/Pain Pain Assessment Pain Assessment: Faces Faces Pain Scale: Hurts a little bit Pain Location: LLE Pain Descriptors / Indicators: Discomfort Pain Intervention(s): Limited activity within patient's tolerance, Monitored during session, Repositioned    Home Living Family/patient expects to be discharged to:: Private residence Living Arrangements: Spouse/significant other Available Help at Discharge: Family;Available 24 hours/day Type of Home: House Home Access: Stairs to enter Entrance  Stairs-Rails: Right Entrance Stairs-Number of Steps: 3   Home Layout: One level Home Equipment: Agricultural consultant (2 wheels);Cane - quad      Prior Function Prior Level of Function : Independent/Modified Independent;Driving             Mobility Comments: Tourist information centre manager without AD ADLs Comments: Independent     Extremity/Trunk Assessment   Upper Extremity Assessment Upper Extremity  Assessment: Overall WFL for tasks assessed    Lower Extremity Assessment Lower Extremity Assessment: Overall WFL for tasks assessed    Cervical / Trunk Assessment Cervical / Trunk Assessment: Normal  Communication   Communication Communication: No apparent difficulties  Cognition Arousal: Alert Behavior During Therapy: WFL for tasks assessed/performed Overall Cognitive Status: Within Functional Limits for tasks assessed                                          General Comments      Exercises     Assessment/Plan    PT Assessment Patient does not need any further PT services  PT Problem List         PT Treatment Interventions      PT Goals (Current goals can be found in the Care Plan section)  Acute Rehab PT Goals Patient Stated Goal: return home with family to assist PT Goal Formulation: With patient/family Time For Goal Achievement: 05/12/23 Potential to Achieve Goals: Good    Frequency       Co-evaluation               AM-PAC PT "6 Clicks" Mobility  Outcome Measure Help needed turning from your back to your side while in a flat bed without using bedrails?: None Help needed moving from lying on your back to sitting on the side of a flat bed without using bedrails?: None Help needed moving to and from a bed to a chair (including a wheelchair)?: None Help needed standing up from a chair using your arms (e.g., wheelchair or bedside chair)?: None Help needed to walk in hospital room?: A Little Help needed climbing 3-5 steps with a railing? : A Little 6 Click Score: 22    End of Session   Activity Tolerance: Patient tolerated treatment well;Patient limited by fatigue Patient left: in bed;with call bell/phone within reach;with family/visitor present Nurse Communication: Mobility status PT Visit Diagnosis: Unsteadiness on feet (R26.81);Other abnormalities of gait and mobility (R26.89);Muscle weakness (generalized) (M62.81)    Time:  8413-2440 PT Time Calculation (min) (ACUTE ONLY): 20 min   Charges:   PT Evaluation $PT Eval Moderate Complexity: 1 Mod PT Treatments $Therapeutic Activity: 8-22 mins PT General Charges $$ ACUTE PT VISIT: 1 Visit         11:29 AM, 05/12/23 Ocie Bob, MPT Physical Therapist with [facility] 336 [phone] office [phone] mobile phone

## 2023-05-13 DIAGNOSIS — J189 Pneumonia, unspecified organism: Secondary | ICD-10-CM | POA: Diagnosis not present

## 2023-05-13 LAB — COMPREHENSIVE METABOLIC PANEL
ALT: 123 U/L — ABNORMAL HIGH (ref 0–44)
AST: 85 U/L — ABNORMAL HIGH (ref 15–41)
Albumin: 2.9 g/dL — ABNORMAL LOW (ref 3.5–5.0)
Alkaline Phosphatase: 77 U/L (ref 38–126)
Anion gap: 9 (ref 5–15)
BUN: 15 mg/dL (ref 8–23)
CO2: 23 mmol/L (ref 22–32)
Calcium: 7.9 mg/dL — ABNORMAL LOW (ref 8.9–10.3)
Chloride: 96 mmol/L — ABNORMAL LOW (ref 98–111)
Creatinine, Ser: 0.69 mg/dL (ref 0.61–1.24)
GFR, Estimated: >60 mL/min (ref >60)
Glucose, Bld: 179 mg/dL — ABNORMAL HIGH (ref 70–99)
Potassium: 4 mmol/L (ref 3.5–5.1)
Sodium: 128 mmol/L — ABNORMAL LOW (ref 135–145)
Total Bilirubin: 1.3 mg/dL — ABNORMAL HIGH (ref 0.3–1.2)
Total Protein: 6.1 g/dL — ABNORMAL LOW (ref 6.5–8.1)

## 2023-05-13 LAB — GLUCOSE, CAPILLARY
Glucose-Capillary: 183 mg/dL — ABNORMAL HIGH (ref 70–99)
Glucose-Capillary: 241 mg/dL — ABNORMAL HIGH (ref 70–99)
Glucose-Capillary: 239 mg/dL — ABNORMAL HIGH (ref 70–99)
Glucose-Capillary: 266 mg/dL — ABNORMAL HIGH (ref 70–99)

## 2023-05-13 NOTE — Progress Notes (Signed)
PROGRESS NOTE    Mark Foster  GNF:[id number] DOB: 08-Jul-1956 DOA: 05/06/2023 PCP: Benita Stabile, MD   Brief Narrative:    Mark Foster is a 67 y.o. male with medical history significant of pancreatic cancer, prior history of stroke, hypertension, hyperlipidemia, type 2 diabetes, hypothyroidism and prior hx of tobacco abuse; who presented to the emergency department secondary to mildly productive coughing spells, chills, general malaise and shortness of breath. Workup in the ED demonstrated negative COVID PCR, chest x-ray with left lower lobe infiltrate and worsening neutropenia/thrombocytopenia. Cultures taken, antibiotics started and case discussed with oncology service who has recommended admission to the hospital for IV antibiotics and further management. [facility] contacted to admit patient for further treatment and ID continues to follow with assistance in management.  Assessment & Plan:   Principal Problem:   CAP (community acquired pneumonia) Active Problems:   Pancreatic adenocarcinoma (HCC)   Essential hypertension, benign   Mixed hyperlipidemia   Diabetes (HCC)   Diabetes mellitus type 2 in nonobese (HCC)   Hypothyroidism   Tobacco abuse   Neutropenic fever (HCC)  Assessment and Plan:  Sepsis secondary to CAP (community acquired pneumonia) -Patient met criteria for sepsis at time of admission. -Patient reports 2-3 days of productive cough, short winded sensation, chills and general malaise. -Found with worsening neutropenia and spiking fever -Continue to follow culture result -Continue the use of cefepime and Zithromax; vancomycin added as well to broaden antibiotics. -Continue to maintain adequate hydration and provide as needed antipyretics -Granix x 1 provided and neutropenia/absolute neutrophil counts improved and above 1000. -COVID PCR negative -Continue mucolytics and as needed bronchodilators. -CT scan of his left lower extremity/pelvis without  abnormalities. -Urinalysis, cultures and blood cultures no triggering any other microorganism or source of infection at the moment -Given concern for ongoing fever episodes no driven by pneumonia only, infectious disease (Dr. Drue Second) has been contacted and will follow recommendation as part of further workup needed. -Blood culture has been repeated and currently negative. -Fungal blood cultures pending   Pancreatic adenocarcinoma (HCC) -Status post surgical resection -Continue outpatient follow-up with oncology service. -Currently not receiving chemotherapy management. -Continue supportive care.   Neutropenic fever (HCC) -Neutrophils/WBCs to stabilize after Granix infusion. -Continue to follow culture results. -Given still ongoing episodes of fever 2D echo has been ordered and will follow results -Infectious disease has been contacted for assistance with concern for FUO; will follow any further recommendations. -Antibiotic has been Broaden and will continue as needed antipyretics. -CT chest, abdomen and pelvis has failed to demonstrate acute process to explain ongoing febrile illness.   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 -Discussed with patient regarding keeping himself tobacco free and avoid secondhand exposure.   Hypothyroidism -TSH within normal limits. -Continue Synthroid   Diabetes mellitus type 2 in nonobese (HCC) -A1c 7.2 -Continue to follow CBGs fluctuation and adjust hypoglycemic regimen as needed -Continue sliding scale insulin and Semglee has been ordered   Mixed hyperlipidemia -Continue treatment with Lipitor and Zetia -Heart healthy diet has been encouraged.   Essential hypertension, benign -Continue home antihypertensive agents -Heart healthy diet has been ordered   Left lower extremity pain -Chest x-ray without acute fractures -CT scan demonstrating no acute abnormalities to explain patient's pain. -Continue as  needed analgesics -Tylenol, oxycodone and as needed Dilaudid has been ordered.   Pancytopenia -Decrease in patient's hemoglobin most likely associated with hemodilution; no overt bleeding. -Chronically with neutropenia and thrombocytopenia which most likely have worsened  in the setting of sepsis from pneumonia and fever of unknown origin. -Continue supportive care and follow blood counts trend.  Obesity BMI 31.61    DVT prophylaxis:SCDs Code Status: Full Family Communication: Wife at bedside 9/8 Disposition Plan:  Status is: Inpatient Remains inpatient appropriate because: Continues to require IV antibiotics.     Consultants:  ID  Procedures:  None  Antimicrobials:  Anti-infectives (From admission, onward)    Start     Dose/Rate Route Frequency Ordered Stop   05/11/23 1800  vancomycin (VANCOREADY) IVPB 1250 mg/250 mL        1,250 mg 166.7 mL/hr over 90 Minutes Intravenous Every 12 hours 05/11/23 1130     05/11/23 1400  ceFEPIme (MAXIPIME) 2 g in sodium chloride 0.9 % 100 mL IVPB        2 g 200 mL/hr over 30 Minutes Intravenous Every 8 hours 05/11/23 0855     05/11/23 0400  vancomycin (VANCOCIN) IVPB 1000 mg/200 mL premix  Status:  Discontinued        1,000 mg 200 mL/hr over 60 Minutes Intravenous Every 12 hours 05/10/23 1429 05/11/23 1130   05/10/23 1515  vancomycin (VANCOREADY) IVPB 2000 mg/400 mL        2,000 mg 200 mL/hr over 120 Minutes Intravenous  Once 05/10/23 1421 05/10/23 1803   05/07/23 1000  cefTRIAXone (ROCEPHIN) 2 g in sodium chloride 0.9 % 100 mL IVPB  Status:  Discontinued        2 g 200 mL/hr over 30 Minutes Intravenous Every 24 hours 05/06/23 1148 05/06/23 1627   05/07/23 0800  azithromycin (ZITHROMAX) 500 mg in sodium chloride 0.9 % 250 mL IVPB  Status:  Discontinued        500 mg 250 mL/hr over 60 Minutes Intravenous Every 24 hours 05/06/23 1148 05/10/23 1640   05/06/23 1800  ceFEPIme (MAXIPIME) 2 g in sodium chloride 0.9 % 100 mL IVPB  Status:   Discontinued        2 g 200 mL/hr over 30 Minutes Intravenous Every 8 hours 05/06/23 1709 05/11/23 0855   05/06/23 1130  cefTRIAXone (ROCEPHIN) 2 g in sodium chloride 0.9 % 100 mL IVPB        2 g 200 mL/hr over 30 Minutes Intravenous  Once 05/06/23 1127 05/06/23 1231   05/06/23 1130  azithromycin (ZITHROMAX) 500 mg in sodium chloride 0.9 % 250 mL IVPB        500 mg 250 mL/hr over 60 Minutes Intravenous  Once 05/06/23 1127 05/06/23 1250         Subjective: Patient seen and evaluated today with no new acute complaints or concerns.  Noted to have Tmax of 103 Fahrenheit yesterday evening.  He is quite frustrated and wants to know why he is still having fevers.  Wife is at bedside.  Objective: Vitals:   05/12/23 1810 05/12/23 2147 05/13/23 0144 05/13/23 0522  BP: (!) 114/53 126/63 132/68 122/63  Pulse: 97  95 93  Resp:  18 19 20   Temp: 99.8 F (37.7 C) 98.8 F (37.1 C) 98.7 F (37.1 C) 99 F (37.2 C)  TempSrc: Oral Axillary  Oral  SpO2: 94% 93% 97% 95%  Weight:      Height:        Intake/Output Summary (Last 24 hours) at 05/13/2023 0719 Last data filed at 05/12/2023 1700 Gross per 24 hour  Intake 480 ml  Output --  Net 480 ml   Filed Weights   05/06/23 0757 05/12/23 0519  Weight: 99.8 kg 99.9 kg    Examination:  General exam: Appears calm and comfortable  Respiratory system: Clear to auscultation. Respiratory effort normal. Cardiovascular system: S1 & S2 heard, RRR.  Gastrointestinal system: Abdomen is soft Central nervous system: Alert and awake Extremities: No edema Skin: No significant lesions noted Psychiatry: Flat affect.    Data Reviewed: I have personally reviewed following labs and imaging studies  CBC: Recent Labs  Lab 05/06/23 0810 05/07/23 0408 05/09/23 0440 05/11/23 0540  WBC 0.9* 2.3* 2.5* 2.2*  NEUTROABS 0.4* 1.1*  --   --   HGB 12.1* 11.6* 10.4* 10.8*  HCT 33.7* 32.2* 28.8* 29.7*  MCV 102.4* 102.5* 100.7* 101.4*  PLT 58* 54* 50* 57*    Basic Metabolic Panel: Recent Labs  Lab 05/06/23 0810 05/06/23 1147 05/07/23 0408 05/09/23 0440 05/11/23 0431 05/12/23 0423 05/13/23 0352  NA 132*  --  132* 131* 128*  --  128*  K 4.1  --  3.8 3.7 4.1  --  4.0  CL 99  --  102 98 97*  --  96*  CO2 24  --  24 22 21*  --  23  GLUCOSE 241*  --  145* 213* 208*  --  179*  BUN 17  --  14 15 17   --  15  CREATININE 0.80  --  0.93 0.77 0.83 0.91 0.69  CALCIUM 8.2*  --  8.0* 8.0* 7.9*  --  7.9*  MG  --  1.8  --   --   --   --   --   PHOS  --  2.8  --   --   --   --   --    GFR: Estimated Creatinine Clearance: 104.8 mL/min (by C-G formula based on SCr of 0.69 mg/dL). Liver Function Tests: Recent Labs  Lab 05/06/23 0810 05/11/23 1652 05/13/23 0352  AST 28 80* 85*  ALT 33 103* 123*  ALKPHOS 52 79 77  BILITOT 1.3* 1.6* 1.3*  PROT 6.6 6.5 6.1*  ALBUMIN 3.7 3.2* 2.9*   No results for input(s): "LIPASE", "AMYLASE" in the last 168 hours. No results for input(s): "AMMONIA" in the last 168 hours. Coagulation Profile: No results for input(s): "INR", "PROTIME" in the last 168 hours. Cardiac Enzymes: No results for input(s): "CKTOTAL", "CKMB", "CKMBINDEX", "TROPONINI" in the last 168 hours. BNP (last 3 results) No results for input(s): "PROBNP" in the last 8760 hours. HbA1C: No results for input(s): "HGBA1C" in the last 72 hours. CBG: Recent Labs  Lab 05/11/23 2224 05/12/23 0738 05/12/23 1105 05/12/23 1613 05/12/23 2122  GLUCAP 302* 266* 305* 305* 216*   Lipid Profile: No results for input(s): "CHOL", "HDL", "LDLCALC", "TRIG", "CHOLHDL", "LDLDIRECT" in the last 72 hours. Thyroid Function Tests: No results for input(s): "TSH", "T4TOTAL", "FREET4", "T3FREE", "THYROIDAB" in the last 72 hours. Anemia Panel: No results for input(s): "VITAMINB12", "FOLATE", "FERRITIN", "TIBC", "IRON", "RETICCTPCT" in the last 72 hours. Sepsis Labs: Recent Labs  Lab 05/06/23 0925  LATICACIDVEN 1.1    Recent Results (from the past 240  hour(s))  Resp panel by RT-PCR (RSV, Flu A&B, Covid) Anterior Nasal Swab     Status: None   Collection Time: 05/06/23  8:15 AM   Specimen: Anterior Nasal Swab  Result Value Ref Range Status   SARS Coronavirus 2 by RT PCR NEGATIVE NEGATIVE Final    Comment: (NOTE) SARS-CoV-2 target nucleic acids are NOT DETECTED.  The SARS-CoV-2 RNA is generally detectable in upper respiratory specimens during the acute phase  of infection. The lowest concentration of SARS-CoV-2 viral copies this assay can detect is 138 copies/mL. A negative result does not preclude SARS-Cov-2 infection and should not be used as the sole basis for treatment or other patient management decisions. A negative result may occur with  improper specimen collection/handling, submission of specimen other than nasopharyngeal swab, presence of viral mutation(s) within the areas targeted by this assay, and inadequate number of viral copies(<138 copies/mL). A negative result must be combined with clinical observations, patient history, and epidemiological information. The expected result is Negative.  Fact Sheet for Patients:  BloggerCourse.com  Fact Sheet for Healthcare Providers:  SeriousBroker.it  This test is no t yet approved or cleared by the [place] FDA and  has been authorized for detection and/or diagnosis of SARS-CoV-2 by FDA under an Emergency Use Authorization (EUA). This EUA will remain  in effect (meaning this test can be used) for the duration of the COVID-19 declaration under Section 564(b)(1) of the Act, 21 U.S.C.section 360bbb-3(b)(1), unless the authorization is terminated  or revoked sooner.       Influenza A by PCR NEGATIVE NEGATIVE Final   Influenza B by PCR NEGATIVE NEGATIVE Final    Comment: (NOTE) The Xpert Xpress SARS-CoV-2/FLU/RSV plus assay is intended as an aid in the diagnosis of influenza from Nasopharyngeal swab specimens and should not  be used as a sole basis for treatment. Nasal washings and aspirates are unacceptable for Xpert Xpress SARS-CoV-2/FLU/RSV testing.  Fact Sheet for Patients: BloggerCourse.com  Fact Sheet for Healthcare Providers: SeriousBroker.it  This test is not yet approved or cleared by the [place] FDA and has been authorized for detection and/or diagnosis of SARS-CoV-2 by FDA under an Emergency Use Authorization (EUA). This EUA will remain in effect (meaning this test can be used) for the duration of the COVID-19 declaration under Section 564(b)(1) of the Act, 21 U.S.C. section 360bbb-3(b)(1), unless the authorization is terminated or revoked.     Resp Syncytial Virus by PCR NEGATIVE NEGATIVE Final    Comment: (NOTE) Fact Sheet for Patients: BloggerCourse.com  Fact Sheet for Healthcare Providers: SeriousBroker.it  This test is not yet approved or cleared by the [place] FDA and has been authorized for detection and/or diagnosis of SARS-CoV-2 by FDA under an Emergency Use Authorization (EUA). This EUA will remain in effect (meaning this test can be used) for the duration of the COVID-19 declaration under Section 564(b)(1) of the Act, 21 U.S.C. section 360bbb-3(b)(1), unless the authorization is terminated or revoked.  Performed at [facility], [address], [place], [place] [address]   Blood culture (routine x 2)     Status: None   Collection Time: 05/06/23  9:25 AM   Specimen: Right Antecubital; Blood  Result Value Ref Range Status   Specimen Description   Final    RIGHT ANTECUBITAL BOTTLES DRAWN AEROBIC AND ANAEROBIC   Special Requests Blood Culture adequate volume  Final   Culture   Final    NO GROWTH 5 DAYS Performed at [facility], [address], [place], [place] [address]    Report Status 05/11/2023 FINAL  Final  Blood culture (routine x 2)     Status: None    Collection Time: 05/06/23  9:25 AM   Specimen: Left Antecubital; Blood  Result Value Ref Range Status   Specimen Description   Final    LEFT ANTECUBITAL BOTTLES DRAWN AEROBIC AND ANAEROBIC   Special Requests Blood Culture adequate volume  Final   Culture   Final  NO GROWTH 5 DAYS Performed at [facility], [address], [place], [place] [address]    Report Status 05/11/2023 FINAL  Final  MRSA Next Gen by PCR, Nasal     Status: None   Collection Time: 05/12/23  2:55 PM   Specimen: Nasal Mucosa; Nasal Swab  Result Value Ref Range Status   MRSA by PCR Next Gen NOT DETECTED NOT DETECTED Final    Comment: (NOTE) The GeneXpert MRSA Assay (FDA approved for NASAL specimens only), is one component of a comprehensive MRSA colonization surveillance program. It is not intended to diagnose MRSA infection nor to guide or monitor treatment for MRSA infections. Test performance is not FDA approved in patients less than 78 years old. Performed at [facility], [address], [place], [place] [address]          Radiology Studies: CT CHEST ABDOMEN PELVIS W CONTRAST  Result Date: 05/11/2023 CLINICAL DATA:  Pneumonia, chills. Concern for sepsis. History of pancreatic cancer status post distal pancreatectomy and splenectomy. EXAM: CT CHEST, ABDOMEN, AND PELVIS WITH CONTRAST TECHNIQUE: Multidetector CT imaging of the chest, abdomen and pelvis was performed following the standard protocol during bolus administration of intravenous contrast. RADIATION DOSE REDUCTION: This exam was performed according to the departmental dose-optimization program which includes automated exposure control, adjustment of the mA and/or kV according to patient size and/or use of iterative reconstruction technique. CONTRAST:  OMNIPAQUE IOHEXOL 300 MG/ML  SOLN COMPARISON:  CT abdomen pelvis dated 12/19/2022. FINDINGS: CT CHEST FINDINGS Cardiovascular: There is no cardiomegaly or pericardial effusion. Advanced three-vessel  coronary vascular calcification and postsurgical changes of CABG. Mild atherosclerotic calcification of the thoracic aorta. No aneurysmal dilatation or dissection. The origins of the great vessels of the aortic arch and the central pulmonary arteries appear patent for the degree of opacification. Mediastinum/Nodes: No hilar or mediastinal adenopathy. The esophagus is grossly unremarkable. No mediastinal fluid collection. Lungs/Pleura: Mild centrilobular emphysema. A 5 mm left upper lobe nodule (46/5), not seen on the CT are 12/18/2019. There is trace left pleural effusion. Minimal bibasilar atelectasis. No focal consolidation or pneumothorax. The central airways are patent. Musculoskeletal: Median sternotomy wires. No acute osseous pathology. CT ABDOMEN PELVIS FINDINGS No intra-abdominal free air or free fluid. Hepatobiliary: Mild fatty liver. No biliary dilatation. There is gallstone. No pericholecystic fluid or evidence of acute cholecystitis by CT. Pancreas: Postsurgical changes of distal pancreatectomy. The visualized head and uncinate process of the pancreas appear unremarkable. 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 Spleen: Splenectomy. Adrenals/Urinary Tract: The adrenal glands are unremarkable. There is no hydronephrosis on either side. There is symmetric enhancement and excretion of contrast by both kidneys. Mild bilateral perinephric stranding, nonspecific. Correlation with urinalysis recommended to exclude UTI. The visualized ureters appear unremarkable. There is mild trabeculated appearance of the bladder wall which may be related to chronic bladder outlet obstruction. The urinary bladder is otherwise unremarkable. Stomach/Bowel: There is moderate stool throughout the colon. There is distal colonic diverticulosis without active inflammatory changes. There is a 2 cm distal duodenal diverticulum without active  inflammation. There is no bowel obstruction or active inflammation. Appendectomy. Vascular/Lymphatic: Moderate aortoiliac atherosclerotic disease. The IVC is unremarkable. No portal venous gas. There is no adenopathy. Reproductive: The prostate and seminal vesicles are grossly unremarkable. No pelvic mass. Other: Asymmetric atrophy of the right rectus abdominus muscle. Small fat containing umbilical hernia as well as several fat containing left ventral upper abdominal hernia  is similar to prior CT. No bowel herniation or fluid collection. Musculoskeletal: No acute osseous pathology. IMPRESSION: 1. No acute intrathoracic, abdominal, or pelvic pathology. 2. Trace left pleural effusion. 3. A 5 mm left upper lobe nodule, not seen on the CT are 12/18/2019. 4. Mild fatty liver. 5. Cholelithiasis. 6. Postsurgical changes of distal pancreatectomy and splenectomy. No CT findings of recurrent disease. 7. Colonic diverticulosis. No bowel obstruction. 8. Aortic Atherosclerosis (ICD10-I70.0) and Emphysema (ICD10-J43.9). Electronically Signed   By: Elgie Collard M.D.   On: 05/11/2023 19:35        Scheduled Meds:  aspirin EC  81 mg Oral Q breakfast   atorvastatin  80 mg Oral Daily   budesonide (PULMICORT) nebulizer solution  0.5 mg Nebulization BID   cholecalciferol  1,000 Units Oral Daily   cyanocobalamin  1,000 mcg Oral Daily   dextromethorphan-guaiFENesin  1 tablet Oral BID   docusate sodium  100 mg Oral BID   ezetimibe  10 mg Oral Daily   gabapentin  300 mg Oral TID   insulin aspart  0-5 Units Subcutaneous QHS   insulin aspart  0-9 Units Subcutaneous TID WC   insulin aspart  5 Units Subcutaneous TID WC   insulin glargine-yfgn  55 Units Subcutaneous QHS   levothyroxine  50 mcg Oral Daily   metoprolol succinate  25 mg Oral Daily   polyethylene glycol  17 g Oral Daily   tamsulosin  0.4 mg Oral QPC supper   zinc sulfate  220 mg Oral Daily   Continuous Infusions:  ceFEPime (MAXIPIME) IV 2 g (05/13/23  0543)   vancomycin 1,250 mg (05/13/23 0650)     LOS: 7 days    Time spent: 35 minutes    Daionna Crossland Hoover Brunette, DO [facility]  If 7PM-7AM, please contact night-coverage [email or web address] 05/13/2023, 7:19 AM

## 2023-05-13 NOTE — Progress Notes (Signed)
Pt stated he is just not feeling good this a.m. Pt tearful and stated "I am just ready for this to be over." He also became agitated and stated "nobody has been telling me anything about what is going on." Emotional support given. Pain medications offered twice during this shift and pt declined because he "feels like he has been receiving too many."

## 2023-05-13 NOTE — Plan of Care (Signed)

## 2023-05-14 DIAGNOSIS — J189 Pneumonia, unspecified organism: Secondary | ICD-10-CM | POA: Diagnosis not present

## 2023-05-14 LAB — COMPREHENSIVE METABOLIC PANEL
ALT: 148 U/L — ABNORMAL HIGH (ref 0–44)
AST: 89 U/L — ABNORMAL HIGH (ref 15–41)
Albumin: 2.9 g/dL — ABNORMAL LOW (ref 3.5–5.0)
Alkaline Phosphatase: 75 U/L (ref 38–126)
Anion gap: 11 (ref 5–15)
BUN: 14 mg/dL (ref 8–23)
CO2: 22 mmol/L (ref 22–32)
Calcium: 8.1 mg/dL — ABNORMAL LOW (ref 8.9–10.3)
Chloride: 98 mmol/L (ref 98–111)
Creatinine, Ser: 0.65 mg/dL (ref 0.61–1.24)
GFR, Estimated: >60 mL/min (ref >60)
Glucose, Bld: 179 mg/dL — ABNORMAL HIGH (ref 70–99)
Potassium: 4.2 mmol/L (ref 3.5–5.1)
Sodium: 131 mmol/L — ABNORMAL LOW (ref 135–145)
Total Bilirubin: 1.4 mg/dL — ABNORMAL HIGH (ref 0.3–1.2)
Total Protein: 6 g/dL — ABNORMAL LOW (ref 6.5–8.1)

## 2023-05-14 LAB — MAGNESIUM: Magnesium: 2.3 mg/dL (ref 1.7–2.4)

## 2023-05-14 LAB — GLUCOSE, CAPILLARY
Glucose-Capillary: 173 mg/dL — ABNORMAL HIGH (ref 70–99)
Glucose-Capillary: 238 mg/dL — ABNORMAL HIGH (ref 70–99)
Glucose-Capillary: 225 mg/dL — ABNORMAL HIGH (ref 70–99)
Glucose-Capillary: 260 mg/dL — ABNORMAL HIGH (ref 70–99)

## 2023-05-14 LAB — CBC
HCT: 33.6 % — ABNORMAL LOW (ref 39.0–52.0)
Hemoglobin: 11.8 g/dL — ABNORMAL LOW (ref 13.0–17.0)
MCH: 36.9 pg — ABNORMAL HIGH (ref 26.0–34.0)
MCHC: 35.1 g/dL (ref 30.0–36.0)
MCV: 105 fL — ABNORMAL HIGH (ref 80.0–100.0)
Platelets: 56 10*3/uL — ABNORMAL LOW (ref 150–400)
RBC: 3.2 MIL/uL — ABNORMAL LOW (ref 4.22–5.81)
RDW: 16.3 % — ABNORMAL HIGH (ref 11.5–15.5)
WBC: 2.5 10*3/uL — ABNORMAL LOW (ref 4.0–10.5)
nRBC: 0 % (ref 0.0–0.2)

## 2023-05-14 NOTE — Plan of Care (Signed)
  Problem: Activity: Goal: Ability to tolerate increased activity will improve Outcome: Progressing   Problem: Clinical Measurements: Goal: Ability to maintain a body temperature in the normal range will improve Outcome: Progressing   Problem: Respiratory: Goal: Ability to maintain adequate ventilation will improve Outcome: Progressing Goal: Ability to maintain a clear airway will improve Outcome: Progressing   

## 2023-05-14 NOTE — Progress Notes (Signed)
      INFECTIOUS DISEASE ATTENDING ADDENDUM:   Date: 05/14/2023  Patient name: Mark Foster  Medical record number: [id number]  Date of birth: [birth year]   The patient's neutropenic fevers have completely resolved in the context of Granix administration and continue cefepime.  He has now received 7 days of cefepime I think this can be stopped after today and he can be discharged to home with have him follow-up with hematology oncology.  It is not completely obvious to me through looking through the chart why he has pancytopenia but certainly would defer to hematology oncology with regards to reasons for this  I will sign off for now please call with further questions   Acey Lav 05/14/2023, 7:11 PM

## 2023-05-14 NOTE — Plan of Care (Signed)

## 2023-05-14 NOTE — Progress Notes (Addendum)
PROGRESS NOTE    Mark Foster  ZOX:[id number] DOB: [birth year] DOA: 05/06/2023 PCP: Benita Stabile, MD   Brief Narrative:    Mark Foster is a 67 y.o. male with medical history significant of pancreatic cancer, prior history of stroke, hypertension, hyperlipidemia, type 2 diabetes, hypothyroidism and prior hx of tobacco abuse; who presented to the emergency department secondary to mildly productive coughing spells, chills, general malaise and shortness of breath. Workup in the ED demonstrated negative COVID PCR, chest x-ray with left lower lobe infiltrate and worsening neutropenia/thrombocytopenia. Cultures taken, antibiotics started and case discussed with oncology service who has recommended admission to the hospital for IV antibiotics and further management. [facility] contacted to admit patient for further treatment and ID continues to follow with assistance in management.  Assessment & Plan:   Principal Problem:   CAP (community acquired pneumonia) Active Problems:   Pancreatic adenocarcinoma (HCC)   Essential hypertension, benign   Mixed hyperlipidemia   Diabetes (HCC)   Diabetes mellitus type 2 in nonobese (HCC)   Hypothyroidism   Tobacco abuse   Neutropenic fever (HCC)  Assessment and Plan:  Sepsis secondary to CAP (community acquired pneumonia) -Patient met criteria for sepsis at time of admission. -Patient reports 2-3 days of productive cough, short winded sensation, chills and general malaise. -Found with worsening neutropenia and spiking fever -Continue to follow culture result -Continue the use of cefepime and Zithromax; vancomycin discontinued 9/8 -Continue to maintain adequate hydration and provide as needed antipyretics -Granix x 1 provided and neutropenia/absolute neutrophil counts improved and above 1000. -COVID PCR negative -Continue mucolytics and as needed bronchodilators. -CT scan of his left lower extremity/pelvis without abnormalities. -Urinalysis, cultures and  blood cultures no triggering any other microorganism or source of infection at the moment -Given concern for ongoing fever episodes no driven by pneumonia only, infectious disease (Dr. Drue Second) has been contacted and will follow recommendation as part of further workup needed. -Blood culture has been repeated and currently negative x 2 days -Fungal blood cultures pending   Pancreatic adenocarcinoma (HCC) -Status post surgical resection -Continue outpatient follow-up with oncology service. -Currently not receiving chemotherapy management. -Continue supportive care.   Neutropenic fever (HCC) -Neutrophils/WBCs to stabilize after Granix infusion. -Continue to follow culture results. -Given still ongoing episodes of fever 2D echo has been ordered and will follow results -Infectious disease has been contacted for assistance with concern for FUO; will follow any further recommendations. -Antibiotic has been Broaden and will continue as needed antipyretics. -CT chest, abdomen and pelvis has failed to demonstrate acute process to explain ongoing febrile illness.   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 -Discussed with patient regarding keeping himself tobacco free and avoid secondhand exposure.   Hypothyroidism -TSH within normal limits. -Continue Synthroid   Diabetes mellitus type 2 in nonobese (HCC) -A1c 7.2 -Continue to follow CBGs fluctuation and adjust hypoglycemic regimen as needed -Continue sliding scale insulin and Semglee has been ordered   Mixed hyperlipidemia -Continue treatment with Lipitor and Zetia -Heart healthy diet has been encouraged.   Essential hypertension, benign -Continue home antihypertensive agents -Heart healthy diet has been ordered   Left lower extremity pain -Chest x-ray without acute fractures -CT scan demonstrating no acute abnormalities to explain patient's pain. -Continue as needed analgesics -Tylenol, oxycodone  and as needed Dilaudid has been ordered.   Pancytopenia -Decrease in patient's hemoglobin most likely associated with hemodilution; no overt bleeding. -Chronically with neutropenia and thrombocytopenia which most likely have worsened in  the setting of sepsis from pneumonia and fever of unknown origin. -Continue supportive care and follow blood counts trend.  Obesity BMI 31.61    DVT prophylaxis:SCDs Code Status: Full Family Communication: Wife at bedside 9/8 Disposition Plan:  Status is: Inpatient Remains inpatient appropriate because: Continues to require IV antibiotics.     Consultants:  ID  Procedures:  None  Antimicrobials:  Anti-infectives (From admission, onward)    Start     Dose/Rate Route Frequency Ordered Stop   05/11/23 1800  vancomycin (VANCOREADY) IVPB 1250 mg/250 mL  Status:  Discontinued        1,250 mg 166.7 mL/hr over 90 Minutes Intravenous Every 12 hours 05/11/23 1130 05/13/23 1422   05/11/23 1400  ceFEPIme (MAXIPIME) 2 g in sodium chloride 0.9 % 100 mL IVPB        2 g 200 mL/hr over 30 Minutes Intravenous Every 8 hours 05/11/23 0855     05/11/23 0400  vancomycin (VANCOCIN) IVPB 1000 mg/200 mL premix  Status:  Discontinued        1,000 mg 200 mL/hr over 60 Minutes Intravenous Every 12 hours 05/10/23 1429 05/11/23 1130   05/10/23 1515  vancomycin (VANCOREADY) IVPB 2000 mg/400 mL        2,000 mg 200 mL/hr over 120 Minutes Intravenous  Once 05/10/23 1421 05/10/23 1803   05/07/23 1000  cefTRIAXone (ROCEPHIN) 2 g in sodium chloride 0.9 % 100 mL IVPB  Status:  Discontinued        2 g 200 mL/hr over 30 Minutes Intravenous Every 24 hours 05/06/23 1148 05/06/23 1627   05/07/23 0800  azithromycin (ZITHROMAX) 500 mg in sodium chloride 0.9 % 250 mL IVPB  Status:  Discontinued        500 mg 250 mL/hr over 60 Minutes Intravenous Every 24 hours 05/06/23 1148 05/10/23 1640   05/06/23 1800  ceFEPIme (MAXIPIME) 2 g in sodium chloride 0.9 % 100 mL IVPB  Status:   Discontinued        2 g 200 mL/hr over 30 Minutes Intravenous Every 8 hours 05/06/23 1709 05/11/23 0855   05/06/23 1130  cefTRIAXone (ROCEPHIN) 2 g in sodium chloride 0.9 % 100 mL IVPB        2 g 200 mL/hr over 30 Minutes Intravenous  Once 05/06/23 1127 05/06/23 1231   05/06/23 1130  azithromycin (ZITHROMAX) 500 mg in sodium chloride 0.9 % 250 mL IVPB        500 mg 250 mL/hr over 60 Minutes Intravenous  Once 05/06/23 1127 05/06/23 1250         Subjective: Patient seen and evaluated today with no new acute complaints or concerns.  Fever appears to have resolved in the last 24 hours and blood cultures negative.   Objective: Vitals:   05/13/23 1411 05/13/23 1923 05/13/23 1955 05/14/23 0540  BP: 126/68 (!) 162/76  136/73  Pulse: 81 96  88  Resp: 20 20  16   Temp: 98.2 F (36.8 C) 98.6 F (37 C)  99 F (37.2 C)  TempSrc: Oral Oral  Oral  SpO2: 97% 98% 95% 97%  Weight:    97.6 kg  Height:        Intake/Output Summary (Last 24 hours) at 05/14/2023 0855 Last data filed at 05/14/2023 0606 Gross per 24 hour  Intake 1220 ml  Output --  Net 1220 ml   Filed Weights   05/06/23 0757 05/12/23 0519 05/14/23 0540  Weight: 99.8 kg 99.9 kg 97.6 kg    Examination:  General exam: Appears calm and comfortable  Respiratory system: Clear to auscultation. Respiratory effort normal. Cardiovascular system: S1 & S2 heard, RRR.  Gastrointestinal system: Abdomen is soft Central nervous system: Alert and awake Extremities: No edema Skin: No significant lesions noted Psychiatry: Flat affect.    Data Reviewed: I have personally reviewed following labs and imaging studies  CBC: Recent Labs  Lab 05/09/23 0440 05/11/23 0540 05/14/23 0412  WBC 2.5* 2.2* 2.5*  HGB 10.4* 10.8* 11.8*  HCT 28.8* 29.7* 33.6*  MCV 100.7* 101.4* 105.0*  PLT 50* 57* 56*   Basic Metabolic Panel: Recent Labs  Lab 05/09/23 0440 05/11/23 0431 05/12/23 0423 05/13/23 0352 05/14/23 0412  NA 131* 128*  --  128*  131*  K 3.7 4.1  --  4.0 4.2  CL 98 97*  --  96* 98  CO2 22 21*  --  23 22  GLUCOSE 213* 208*  --  179* 179*  BUN 15 17  --  15 14  CREATININE 0.77 0.83 0.91 0.69 0.65  CALCIUM 8.0* 7.9*  --  7.9* 8.1*  MG  --   --   --   --  2.3   GFR: Estimated Creatinine Clearance: 103.5 mL/min (by C-G formula based on SCr of 0.65 mg/dL). Liver Function Tests: Recent Labs  Lab 05/11/23 1652 05/13/23 0352 05/14/23 0412  AST 80* 85* 89*  ALT 103* 123* 148*  ALKPHOS 79 77 75  BILITOT 1.6* 1.3* 1.4*  PROT 6.5 6.1* 6.0*  ALBUMIN 3.2* 2.9* 2.9*   No results for input(s): "LIPASE", "AMYLASE" in the last 168 hours. No results for input(s): "AMMONIA" in the last 168 hours. Coagulation Profile: No results for input(s): "INR", "PROTIME" in the last 168 hours. Cardiac Enzymes: No results for input(s): "CKTOTAL", "CKMB", "CKMBINDEX", "TROPONINI" in the last 168 hours. BNP (last 3 results) No results for input(s): "PROBNP" in the last 8760 hours. HbA1C: No results for input(s): "HGBA1C" in the last 72 hours. CBG: Recent Labs  Lab 05/13/23 0747 05/13/23 1142 05/13/23 1657 05/13/23 2113 05/14/23 0748  GLUCAP 183* 241* 239* 266* 173*   Lipid Profile: No results for input(s): "CHOL", "HDL", "LDLCALC", "TRIG", "CHOLHDL", "LDLDIRECT" in the last 72 hours. Thyroid Function Tests: No results for input(s): "TSH", "T4TOTAL", "FREET4", "T3FREE", "THYROIDAB" in the last 72 hours. Anemia Panel: No results for input(s): "VITAMINB12", "FOLATE", "FERRITIN", "TIBC", "IRON", "RETICCTPCT" in the last 72 hours. Sepsis Labs: No results for input(s): "PROCALCITON", "LATICACIDVEN" in the last 168 hours.   Recent Results (from the past 240 hour(s))  Resp panel by RT-PCR (RSV, Flu A&B, Covid) Anterior Nasal Swab     Status: None   Collection Time: 05/06/23  8:15 AM   Specimen: Anterior Nasal Swab  Result Value Ref Range Status   SARS Coronavirus 2 by RT PCR NEGATIVE NEGATIVE Final    Comment:  (NOTE) SARS-CoV-2 target nucleic acids are NOT DETECTED.  The SARS-CoV-2 RNA is generally detectable in upper respiratory specimens during the acute phase of infection. The lowest concentration of SARS-CoV-2 viral copies this assay can detect is 138 copies/mL. A negative result does not preclude SARS-Cov-2 infection and should not be used as the sole basis for treatment or other patient management decisions. A negative result may occur with  improper specimen collection/handling, submission of specimen other than nasopharyngeal swab, presence of viral mutation(s) within the areas targeted by this assay, and inadequate number of viral copies(<138 copies/mL). A negative result must be combined with clinical observations, patient history, and epidemiological information.  The expected result is Negative.  Fact Sheet for Patients:  BloggerCourse.com  Fact Sheet for Healthcare Providers:  SeriousBroker.it  This test is no t yet approved or cleared by the [place] FDA and  has been authorized for detection and/or diagnosis of SARS-CoV-2 by FDA under an Emergency Use Authorization (EUA). This EUA will remain  in effect (meaning this test can be used) for the duration of the COVID-19 declaration under Section 564(b)(1) of the Act, 21 U.S.C.section 360bbb-3(b)(1), unless the authorization is terminated  or revoked sooner.       Influenza A by PCR NEGATIVE NEGATIVE Final   Influenza B by PCR NEGATIVE NEGATIVE Final    Comment: (NOTE) The Xpert Xpress SARS-CoV-2/FLU/RSV plus assay is intended as an aid in the diagnosis of influenza from Nasopharyngeal swab specimens and should not be used as a sole basis for treatment. Nasal washings and aspirates are unacceptable for Xpert Xpress SARS-CoV-2/FLU/RSV testing.  Fact Sheet for Patients: BloggerCourse.com  Fact Sheet for Healthcare  Providers: SeriousBroker.it  This test is not yet approved or cleared by the [place] FDA and has been authorized for detection and/or diagnosis of SARS-CoV-2 by FDA under an Emergency Use Authorization (EUA). This EUA will remain in effect (meaning this test can be used) for the duration of the COVID-19 declaration under Section 564(b)(1) of the Act, 21 U.S.C. section 360bbb-3(b)(1), unless the authorization is terminated or revoked.     Resp Syncytial Virus by PCR NEGATIVE NEGATIVE Final    Comment: (NOTE) Fact Sheet for Patients: BloggerCourse.com  Fact Sheet for Healthcare Providers: SeriousBroker.it  This test is not yet approved or cleared by the [place] FDA and has been authorized for detection and/or diagnosis of SARS-CoV-2 by FDA under an Emergency Use Authorization (EUA). This EUA will remain in effect (meaning this test can be used) for the duration of the COVID-19 declaration under Section 564(b)(1) of the Act, 21 U.S.C. section 360bbb-3(b)(1), unless the authorization is terminated or revoked.  Performed at [facility], [address], [place], [place] [address]   Blood culture (routine x 2)     Status: None   Collection Time: 05/06/23  9:25 AM   Specimen: Right Antecubital; Blood  Result Value Ref Range Status   Specimen Description   Final    RIGHT ANTECUBITAL BOTTLES DRAWN AEROBIC AND ANAEROBIC   Special Requests Blood Culture adequate volume  Final   Culture   Final    NO GROWTH 5 DAYS Performed at [facility], [address], [place], [place] [address]    Report Status 05/11/2023 FINAL  Final  Blood culture (routine x 2)     Status: None   Collection Time: 05/06/23  9:25 AM   Specimen: Left Antecubital; Blood  Result Value Ref Range Status   Specimen Description   Final    LEFT ANTECUBITAL BOTTLES DRAWN AEROBIC AND ANAEROBIC   Special Requests Blood Culture adequate  volume  Final   Culture   Final    NO GROWTH 5 DAYS Performed at [facility], [address], [place], [place] [address]    Report Status 05/11/2023 FINAL  Final  MRSA Next Gen by PCR, Nasal     Status: None   Collection Time: 05/12/23  2:55 PM   Specimen: Nasal Mucosa; Nasal Swab  Result Value Ref Range Status   MRSA by PCR Next Gen NOT DETECTED NOT DETECTED Final    Comment: (NOTE) The GeneXpert MRSA Assay (FDA approved for NASAL specimens only), is  one component of a comprehensive MRSA colonization surveillance program. It is not intended to diagnose MRSA infection nor to guide or monitor treatment for MRSA infections. Test performance is not FDA approved in patients less than 47 years old. Performed at [facility], [address], [place], [place] [address]   Culture, blood (Routine X 2) w Reflex to ID Panel     Status: None (Preliminary result)   Collection Time: 05/12/23  3:22 PM   Specimen: BLOOD  Result Value Ref Range Status   Specimen Description BLOOD BLOOD LEFT ARM  Final   Special Requests NONE  Final   Culture   Final    NO GROWTH 2 DAYS Performed at [facility], [address] [place], [place] [address]    Report Status PENDING  Incomplete  Culture, blood (Routine X 2) w Reflex to ID Panel     Status: None (Preliminary result)   Collection Time: 05/12/23  3:22 PM   Specimen: BLOOD  Result Value Ref Range Status   Specimen Description BLOOD BLOOD LEFT HAND  Final   Special Requests NONE  Final   Culture   Final    NO GROWTH 2 DAYS Performed at [facility], [address] [place], [place] [address]    Report Status PENDING  Incomplete         Radiology Studies: No results found.      Scheduled Meds:  aspirin EC  81 mg Oral Q breakfast   atorvastatin  80 mg Oral Daily   budesonide (PULMICORT) nebulizer solution  0.5 mg Nebulization BID   cholecalciferol  1,000 Units Oral Daily   cyanocobalamin  1,000 mcg Oral Daily    dextromethorphan-guaiFENesin  1 tablet Oral BID   docusate sodium  100 mg Oral BID   ezetimibe  10 mg Oral Daily   gabapentin  300 mg Oral TID   insulin aspart  0-5 Units Subcutaneous QHS   insulin aspart  0-9 Units Subcutaneous TID WC   insulin aspart  5 Units Subcutaneous TID WC   insulin glargine-yfgn  55 Units Subcutaneous QHS   levothyroxine  50 mcg Oral Daily   metoprolol succinate  25 mg Oral Daily   polyethylene glycol  17 g Oral Daily   tamsulosin  0.4 mg Oral QPC supper   zinc sulfate  220 mg Oral Daily   Continuous Infusions:  ceFEPime (MAXIPIME) IV 2 g (05/14/23 3244)     LOS: 8 days    Time spent: 35 minutes    Rakayla Ricklefs Hoover Brunette, DO [facility]  If 7PM-7AM, please contact night-coverage [email or web address] 05/14/2023, 8:55 AM

## 2023-05-14 NOTE — Inpatient Diabetes Management (Signed)
Inpatient Diabetes Program Recommendations  AACE/ADA: New Consensus Statement on Inpatient Glycemic Control   Target Ranges:  Prepandial:   less than 140 mg/dL      Peak postprandial:   less than 180 mg/dL (1-2 hours)      Critically ill patients:  140 - 180 mg/dL    Latest Reference Range & Units 05/13/23 07:47 05/13/23 11:42 05/13/23 16:57 05/13/23 21:13 05/14/23 07:48  Glucose-Capillary 70 - 99 mg/dL 621 (H) 308 (H) 657 (H) 266 (H) 173 (H)   Review of Glycemic Control  Diabetes history: DM2 Outpatient Diabetes medications: Toujeo 60 units at bedtime, Novolog 18-24 units TID with meals Current orders for Inpatient glycemic control: Semglee 55 units at bedtime, Novolog 0-9 units TID with meals, Novolog 0-5 units at bedtime, Novolog 5 units TID with meals   Inpatient Diabetes Program Recommendations:     Insulin: Please consider increasing meal coverage to Novolog 8 units TID with meals.   Thanks, Orlando Penner, RN, MSN, CDCES Diabetes Coordinator Inpatient Diabetes Program [phone] (Team Pager from 8am to 5pm)

## 2023-05-14 NOTE — Progress Notes (Signed)
Pharmacy Antibiotic Note  Mark Foster is a 67 y.o. male admitted on 05/06/2023 with  neutropenic fever/pneumonia .  PMH includes pancreatic cancer, stroke, HTN, HLD, T2DM, hypothyroid, and tobacco use. Patient not currently receiving chemotherapy. Patient with worsening neutropenia and spiking fever, and all imaging has been negative so far, so infectious diseases to evaluate patient. Pharmacy has been consulted for cefepime dosing.  Vancomycin was discontinued on 9/8 due to patient remaining febrile after adding vancomycin in addition to MRSA PCR returning negative.   Plan: Continue cefepime 2 g IV q8H Follow up culture results and ID recommendations to assess for antibiotic optimization. Monitor renal function to assess for any necessary antibiotic dosing changes.  Height: 5\' 10"  (177.8 cm) Weight: 97.6 kg (215 lb 1.6 oz) IBW/kg (Calculated) : 73  Temp (24hrs), Avg:98.8 F (37.1 C), Min:98.2 F (36.8 C), Max:99.2 F (37.3 C)  Recent Labs  Lab 05/09/23 0440 05/11/23 0431 05/11/23 0540 05/12/23 0423 05/13/23 0352 05/14/23 0412  WBC 2.5*  --  2.2*  --   --  2.5*  CREATININE 0.77 0.83  --  0.91 0.69 0.65    Estimated Creatinine Clearance: 103.5 mL/min (by C-G formula based on SCr of 0.65 mg/dL).    Allergies  Allergen Reactions   Demerol [Meperidine Hcl] Other (See Comments)    convulsions    Antimicrobials this admission: Vanco 9/5 >> 9/8 Cefepime 9/1 >> Azithromycin 9/1 >> 9/6 CTX 9/1 x 1  Microbiology results: 9/1 BCx: NGF 9/7 MRSA PCR: negative 9/7 BCx: NG2D  Thank you for allowing pharmacy to be a part of this patient's care.  Will M. Dareen Piano, PharmD Clinical Pharmacist 05/14/2023 9:48 AM

## 2023-05-15 DIAGNOSIS — J189 Pneumonia, unspecified organism: Secondary | ICD-10-CM | POA: Diagnosis not present

## 2023-05-15 LAB — COMPREHENSIVE METABOLIC PANEL
ALT: 137 U/L — ABNORMAL HIGH (ref 0–44)
AST: 63 U/L — ABNORMAL HIGH (ref 15–41)
Albumin: 2.9 g/dL — ABNORMAL LOW (ref 3.5–5.0)
Alkaline Phosphatase: 66 U/L (ref 38–126)
Anion gap: 6 (ref 5–15)
BUN: 16 mg/dL (ref 8–23)
CO2: 23 mmol/L (ref 22–32)
Calcium: 7.8 mg/dL — ABNORMAL LOW (ref 8.9–10.3)
Chloride: 101 mmol/L (ref 98–111)
Creatinine, Ser: 0.65 mg/dL (ref 0.61–1.24)
GFR, Estimated: >60 mL/min (ref >60)
Glucose, Bld: 180 mg/dL — ABNORMAL HIGH (ref 70–99)
Potassium: 3.8 mmol/L (ref 3.5–5.1)
Sodium: 130 mmol/L — ABNORMAL LOW (ref 135–145)
Total Bilirubin: 1.3 mg/dL — ABNORMAL HIGH (ref 0.3–1.2)
Total Protein: 6 g/dL — ABNORMAL LOW (ref 6.5–8.1)

## 2023-05-15 LAB — GLUCOSE, CAPILLARY: Glucose-Capillary: 156 mg/dL — ABNORMAL HIGH (ref 70–99)

## 2023-05-15 LAB — CBC
HCT: 27.8 % — ABNORMAL LOW (ref 39.0–52.0)
Hemoglobin: 10 g/dL — ABNORMAL LOW (ref 13.0–17.0)
MCH: 36.6 pg — ABNORMAL HIGH (ref 26.0–34.0)
MCHC: 36 g/dL (ref 30.0–36.0)
MCV: 101.8 fL — ABNORMAL HIGH (ref 80.0–100.0)
Platelets: 76 10*3/uL — ABNORMAL LOW (ref 150–400)
RBC: 2.73 MIL/uL — ABNORMAL LOW (ref 4.22–5.81)
RDW: 16.1 % — ABNORMAL HIGH (ref 11.5–15.5)
WBC: 2.5 10*3/uL — ABNORMAL LOW (ref 4.0–10.5)
nRBC: 0 % (ref 0.0–0.2)

## 2023-05-15 LAB — MAGNESIUM: Magnesium: 2 mg/dL (ref 1.7–2.4)

## 2023-05-15 LAB — CMV DNA, QUANTITATIVE, PCR
CMV DNA Quant: NEGATIVE [IU]/mL
Log10 CMV Qn DNA Pl: UNDETERMINED {Log_IU}/mL

## 2023-05-15 NOTE — Plan of Care (Signed)
  Problem: Activity: Goal: Ability to tolerate increased activity will improve Outcome: Completed/Met   Problem: Clinical Measurements: Goal: Ability to maintain a body temperature in the normal range will improve Outcome: Completed/Met   Problem: Respiratory: Goal: Ability to maintain adequate ventilation will improve Outcome: Completed/Met Goal: Ability to maintain a clear airway will improve Outcome: Completed/Met   Problem: Education: Goal: Ability to describe self-care measures that may prevent or decrease complications (Diabetes Survival Skills Education) will improve Outcome: Completed/Met Goal: Individualized Educational Video(s) Outcome: Completed/Met   Problem: Coping: Goal: Ability to adjust to condition or change in health will improve Outcome: Completed/Met   Problem: Fluid Volume: Goal: Ability to maintain a balanced intake and output will improve Outcome: Completed/Met   Problem: Health Behavior/Discharge Planning: Goal: Ability to identify and utilize available resources and services will improve Outcome: Completed/Met Goal: Ability to manage health-related needs will improve Outcome: Completed/Met   Problem: Metabolic: Goal: Ability to maintain appropriate glucose levels will improve Outcome: Completed/Met   Problem: Nutritional: Goal: Maintenance of adequate nutrition will improve Outcome: Completed/Met Goal: Progress toward achieving an optimal weight will improve Outcome: Completed/Met   Problem: Skin Integrity: Goal: Risk for impaired skin integrity will decrease Outcome: Completed/Met   Problem: Tissue Perfusion: Goal: Adequacy of tissue perfusion will improve Outcome: Completed/Met   Problem: Education: Goal: Knowledge of General Education information will improve Description: Including pain rating scale, medication(s)/side effects and non-pharmacologic comfort measures Outcome: Completed/Met   Problem: Health Behavior/Discharge  Planning: Goal: Ability to manage health-related needs will improve Outcome: Completed/Met   Problem: Clinical Measurements: Goal: Ability to maintain clinical measurements within normal limits will improve Outcome: Completed/Met Goal: Will remain free from infection Outcome: Completed/Met Goal: Diagnostic test results will improve Outcome: Completed/Met Goal: Respiratory complications will improve Outcome: Completed/Met Goal: Cardiovascular complication will be avoided Outcome: Completed/Met   Problem: Activity: Goal: Risk for activity intolerance will decrease Outcome: Completed/Met   Problem: Nutrition: Goal: Adequate nutrition will be maintained Outcome: Completed/Met   Problem: Coping: Goal: Level of anxiety will decrease Outcome: Completed/Met   Problem: Elimination: Goal: Will not experience complications related to bowel motility Outcome: Completed/Met Goal: Will not experience complications related to urinary retention Outcome: Completed/Met   Problem: Pain Managment: Goal: General experience of comfort will improve Outcome: Completed/Met   Problem: Safety: Goal: Ability to remain free from injury will improve Outcome: Completed/Met   Problem: Skin Integrity: Goal: Risk for impaired skin integrity will decrease Outcome: Completed/Met   Problem: Education: Goal: Knowledge of risk factors and measures for prevention of condition will improve Outcome: Completed/Met   Problem: Coping: Goal: Psychosocial and spiritual needs will be supported Outcome: Completed/Met   Problem: Respiratory: Goal: Will maintain a patent airway Outcome: Completed/Met Goal: Complications related to the disease process, condition or treatment will be avoided or minimized Outcome: Completed/Met

## 2023-05-15 NOTE — Inpatient Diabetes Management (Signed)
Inpatient Diabetes Program Recommendations  AACE/ADA: New Consensus Statement on Inpatient Glycemic Control   Target Ranges:  Prepandial:   less than 140 mg/dL      Peak postprandial:   less than 180 mg/dL (1-2 hours)      Critically ill patients:  140 - 180 mg/dL    Latest Reference Range & Units 05/14/23 07:48 05/14/23 11:40 05/14/23 16:19 05/14/23 21:30 05/15/23 07:26  Glucose-Capillary 70 - 99 mg/dL 409 (H) 811 (H) 914 (H) 260 (H) 156 (H)   Review of Glycemic Control  Diabetes history: DM2 Outpatient Diabetes medications: Toujeo 60 units at bedtime, Novolog 18-24 units TID with meals Current orders for Inpatient glycemic control: Semglee 55 units at bedtime, Novolog 0-9 units TID with meals, Novolog 0-5 units at bedtime, Novolog 5 units TID with meals   Inpatient Diabetes Program Recommendations:     Insulin: Please consider increasing meal coverage to Novolog 8 units TID with meals.   Thanks, Orlando Penner, RN, MSN, CDCES Diabetes Coordinator Inpatient Diabetes Program [phone] (Team Pager from 8am to 5pm)

## 2023-05-15 NOTE — Consult Note (Signed)
[facility] Customer service manager [facility]) Accountable Care Organization (ACO) [facility] Liaison Note  05/15/2023  [name] 1956-05-07 [id number]  Location: [facility] Liaison screened the patient remotely at [facility].  Insurance: [employer]   Mark Foster is a 67 y.o. male who is a Primary Care Patient of Benita Stabile, MD. The patient was screened for readmission hospitalization with noted low risk score for unplanned readmission risk with 1 IP in 6 months.  The patient was assessed for potential [facility]) Care Management service needs for post hospital transition for care coordination. Review of patient's electronic medical record reveals patient was admitted with Pneumonia. Liaison spoke with [name]) as noted on PING. Informed pt refused services in July 2024. Liaison contacted pt directly while inpt status at [facility] concerning care management  and offered a post hospital follow up call, pt has declined services indicating he "is all straight and doesn't need anything". No referral at this time as pt will discharge from the hospital today.   [facility] Care Management/Population Health does not replace or interfere with any arrangements made by the Inpatient Transition of Care team.   For questions contact:   Elliot Cousin, RN, [facility] Liaison    Population Health Office Hours MTWF  8:00 am-6:00 pm [phone] mobile [phone] [Office toll free line] Office Hours are M-F 8:30 - 5 pm [email or web address]

## 2023-05-15 NOTE — Discharge Summary (Signed)
Physician Discharge Summary  NORWOOD DENHART WUJ:[id number] DOB: 1956/02/27 DOA: 05/06/2023  PCP: Benita Stabile, MD  Admit date: 05/06/2023  Discharge date: 05/15/2023  Admitted From:Home  Disposition:  Home  Recommendations for Outpatient Follow-up:  Follow up with PCP in 1-2 weeks No further need for antibiotics per ID as patient has completed a 7-day course Continue other home medications as prior Follow-up with oncology outpatient as scheduled.  Home Health: None  Equipment/Devices: None  Discharge Condition:Stable  CODE STATUS: Full  Diet recommendation: Heart Healthy/carb modified  Brief/Interim Summary: JAYTHEN VOISINE is a 67 y.o. male with medical history significant of pancreatic cancer, prior history of stroke, hypertension, hyperlipidemia, type 2 diabetes, hypothyroidism and prior hx of tobacco abuse; who presented to the emergency department secondary to mildly productive coughing spells, chills, general malaise and shortness of breath. Workup in the ED demonstrated negative COVID PCR, chest x-ray with left lower lobe infiltrate and worsening neutropenia/thrombocytopenia. Cultures taken, antibiotics started and case discussed with oncology service who has recommended admission to the hospital for IV antibiotics and further management.    He was admitted with sepsis, present on admission secondary to community-acquired pneumonia.  Patient underwent 7 days of treatment with IV cefepime and also had Zithromax and vancomycin during the course of this treatment.  Multiple blood cultures have not demonstrated any growth and it was thought that his ongoing fevers had some component of neutropenic fever and therefore, he received some Granix with improvement.  He has had improvement in his white cell count and no further fevers in the last 48 hours with no growth on any cultures or source of infection noted.  He was seen by ID with recommendations to discontinue further antibiotics at this  point and discharge to home.  It is uncertain why he continues to have ongoing pancytopenia despite no further chemotherapy, but will follow-up with oncology outpatient as scheduled.  Discharge Diagnoses:  Principal Problem:   CAP (community acquired pneumonia) Active Problems:   Pancreatic adenocarcinoma (HCC)   Essential hypertension, benign   Mixed hyperlipidemia   Diabetes (HCC)   Diabetes mellitus type 2 in nonobese (HCC)   Hypothyroidism   Tobacco abuse   Neutropenic fever (HCC)  Principal discharge diagnosis: Sepsis, present on admission secondary to community-acquired pneumonia with ongoing fever of uncertain origin.  Discharge Instructions  Discharge Instructions     Diet - low sodium heart healthy   Complete by: As directed    Increase activity slowly   Complete by: As directed       Allergies as of 05/15/2023       Reactions   Demerol [meperidine Hcl] Other (See Comments)   convulsions        Medication List     TAKE these medications    albuterol 108 (90 Base) MCG/ACT inhaler Commonly known as: VENTOLIN HFA Inhale into the lungs.   aspirin EC 81 MG tablet Take 1 tablet (81 mg total) by mouth daily with breakfast.   atorvastatin 80 MG tablet Commonly known as: LIPITOR Take 1 tablet (80 mg total) by mouth daily at 6 PM. What changed: when to take this   CINNAMON PO Take by mouth.   cyanocobalamin 1000 MCG tablet Commonly known as: VITAMIN B12 Take 1 tablet (1,000 mcg total) by mouth daily.   ECHINACEA COMPLEX PO Take 1,000 mg by mouth daily.   ezetimibe 10 MG tablet Commonly known as: ZETIA Take by mouth.   gabapentin 300 MG capsule Commonly known as:  NEURONTIN Take 1 capsule (300 mg total) by mouth 3 (three) times daily.   Insulin Pen Needle 32G X 4 MM Misc 1 Device by Does not apply route daily. Use as directed to inject insulin four times daily   levothyroxine 50 MCG tablet Commonly known as: SYNTHROID TAKE 1 TABLET EVERY DAY    meloxicam 15 MG tablet Commonly known as: MOBIC Take 15 mg by mouth daily.   metoprolol succinate 25 MG 24 hr tablet Commonly known as: TOPROL-XL Take 25 mg by mouth daily.   multivitamin with minerals Tabs tablet Take 1 tablet by mouth daily.   nitroGLYCERIN 0.4 MG SL tablet Commonly known as: NITROSTAT Place 1 tablet (0.4 mg total) under the tongue every 5 (five) minutes as needed for chest pain.   NovoLOG FlexPen 100 UNIT/ML FlexPen Generic drug: insulin aspart Inject 14-20 Units into the skin 3 (three) times daily before meals. What changed: how much to take   Omega-3 1000 MG Caps Take 1,000 mg by mouth daily.   tamsulosin 0.4 MG Caps capsule Commonly known as: FLOMAX Take 1 capsule (0.4 mg total) by mouth daily after supper.   Toujeo Max SoloStar 300 UNIT/ML Solostar Pen Generic drug: insulin glargine (2 Unit Dial) INJECT 60 UNITS INTO THE SKIN AT BEDTIME.   Vitamin D3 25 MCG (1000 UT) Caps Take 1,000 Units by mouth daily.   zinc sulfate 220 (50 Zn) MG capsule Take 1 capsule (220 mg total) by mouth daily.        Follow-up Information     Benita Stabile, MD. Schedule an appointment as soon as possible for a visit in 1 week(s).   Specialty: Internal Medicine Contact information: [address] [place] [phone]                Allergies  Allergen Reactions   Demerol [Meperidine Hcl] Other (See Comments)    convulsions    Consultations: ID   Procedures/Studies: CT CHEST ABDOMEN PELVIS W CONTRAST  Result Date: 05/11/2023 CLINICAL DATA:  Pneumonia, chills. Concern for sepsis. History of pancreatic cancer status post distal pancreatectomy and splenectomy. EXAM: CT CHEST, ABDOMEN, AND PELVIS WITH CONTRAST TECHNIQUE: Multidetector CT imaging of the chest, abdomen and pelvis was performed following the standard protocol during bolus administration of intravenous contrast. RADIATION DOSE REDUCTION: This exam was performed according  to the departmental dose-optimization program which includes automated exposure control, adjustment of the mA and/or kV according to patient size and/or use of iterative reconstruction technique. CONTRAST:  OMNIPAQUE IOHEXOL 300 MG/ML  SOLN COMPARISON:  CT abdomen pelvis dated 12/19/2022. FINDINGS: CT CHEST FINDINGS Cardiovascular: There is no cardiomegaly or pericardial effusion. Advanced three-vessel coronary vascular calcification and postsurgical changes of CABG. Mild atherosclerotic calcification of the thoracic aorta. No aneurysmal dilatation or dissection. The origins of the great vessels of the aortic arch and the central pulmonary arteries appear patent for the degree of opacification. Mediastinum/Nodes: No hilar or mediastinal adenopathy. The esophagus is grossly unremarkable. No mediastinal fluid collection. Lungs/Pleura: Mild centrilobular emphysema. A 5 mm left upper lobe nodule (46/5), not seen on the CT are 12/18/2019. There is trace left pleural effusion. Minimal bibasilar atelectasis. No focal consolidation or pneumothorax. The central airways are patent. Musculoskeletal: Median sternotomy wires. No acute osseous pathology. CT ABDOMEN PELVIS FINDINGS No intra-abdominal free air or free fluid. Hepatobiliary: Mild fatty liver. No biliary dilatation. There is gallstone. No pericholecystic fluid or evidence of acute cholecystitis by CT. Pancreas: Postsurgical changes of distal  pancreatectomy. The visualized head and uncinate process of the pancreas appear unremarkable. 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 Spleen: Splenectomy. Adrenals/Urinary Tract: The adrenal glands are unremarkable. There is no hydronephrosis on either side. There is symmetric enhancement and excretion of contrast by both kidneys. Mild bilateral perinephric stranding, nonspecific. Correlation with urinalysis recommended to exclude UTI.  The visualized ureters appear unremarkable. There is mild trabeculated appearance of the bladder wall which may be related to chronic bladder outlet obstruction. The urinary bladder is otherwise unremarkable. Stomach/Bowel: There is moderate stool throughout the colon. There is distal colonic diverticulosis without active inflammatory changes. There is a 2 cm distal duodenal diverticulum without active inflammation. There is no bowel obstruction or active inflammation. Appendectomy. Vascular/Lymphatic: Moderate aortoiliac atherosclerotic disease. The IVC is unremarkable. No portal venous gas. There is no adenopathy. Reproductive: The prostate and seminal vesicles are grossly unremarkable. No pelvic mass. Other: Asymmetric atrophy of the right rectus abdominus muscle. Small fat containing umbilical hernia as well as several fat containing left ventral upper abdominal hernia is similar to prior CT. No bowel herniation or fluid collection. Musculoskeletal: No acute osseous pathology. IMPRESSION: 1. No acute intrathoracic, abdominal, or pelvic pathology. 2. Trace left pleural effusion. 3. A 5 mm left upper lobe nodule, not seen on the CT are 12/18/2019. 4. Mild fatty liver. 5. Cholelithiasis. 6. Postsurgical changes of distal pancreatectomy and splenectomy. No CT findings of recurrent disease. 7. Colonic diverticulosis. No bowel obstruction. 8. Aortic Atherosclerosis (ICD10-I70.0) and Emphysema (ICD10-J43.9). Electronically Signed   By: Elgie Collard M.D.   On: 05/11/2023 19:35   ECHOCARDIOGRAM COMPLETE  Result Date: 05/10/2023    ECHOCARDIOGRAM REPORT   Patient Name:   MALIIK SCHONS Date of Exam: 05/10/2023 Medical Rec #:  [id number]      Height:       70.0 in Accession #:    [id number]     Weight:       220.0 lb Date of Birth:  1956/07/17       BSA:          2.174 m Patient Age:    67 years       BP:           144/74 mmHg Patient Gender: M              HR:           97 bpm. Exam Location:  Inpatient Procedure: 2D  Echo, Cardiac Doppler, Color Doppler and Intracardiac            Opacification Agent Indications:    Fever R50.9  History:        Patient has prior history of Echocardiogram examinations, most                 recent 11/12/2018. CAD, CAP and Carotid Disease; Risk                 Factors:Hypertension, Dyslipidemia, Diabetes and Former Smoker.  Sonographer:    Aron Baba Referring Phys: [phone] CARLOS MADERA  Sonographer Comments: Technically difficult study due to poor echo windows. IMPRESSIONS  1. Left ventricular ejection fraction, by estimation, is 60 to 65%. The left ventricle has normal function. The left ventricle has no regional wall motion abnormalities. Left ventricular diastolic parameters are consistent with Grade I diastolic dysfunction (impaired relaxation).  2. Right ventricular systolic function is normal. The right ventricular size is normal. Tricuspid  regurgitation signal is inadequate for assessing PA pressure.  3. Left atrial size was mildly dilated.  4. The mitral valve is normal in structure. No evidence of mitral valve regurgitation. No evidence of mitral stenosis.  5. The aortic valve has an indeterminant number of cusps. Aortic valve regurgitation is not visualized. No aortic stenosis is present. FINDINGS  Left Ventricle: Left ventricular ejection fraction, by estimation, is 60 to 65%. The left ventricle has normal function. The left ventricle has no regional wall motion abnormalities. The left ventricular internal cavity size was normal in size. There is  no left ventricular hypertrophy. Left ventricular diastolic parameters are consistent with Grade I diastolic dysfunction (impaired relaxation). Normal left ventricular filling pressure. Right Ventricle: The right ventricular size is normal. Right vetricular wall thickness was not well visualized. Right ventricular systolic function is normal. Tricuspid regurgitation signal is inadequate for assessing PA pressure. Left Atrium: Left atrial size  was mildly dilated. Right Atrium: Right atrial size was normal in size. Pericardium: There is no evidence of pericardial effusion. Mitral Valve: The mitral valve is normal in structure. There is mild thickening of the mitral valve leaflet(s). There is mild calcification of the mitral valve leaflet(s). Mild mitral annular calcification. No evidence of mitral valve regurgitation. No evidence of mitral valve stenosis. Tricuspid Valve: The tricuspid valve is normal in structure. Tricuspid valve regurgitation is not demonstrated. No evidence of tricuspid stenosis. Aortic Valve: The aortic valve has an indeterminant number of cusps. Aortic valve regurgitation is not visualized. No aortic stenosis is present. Aortic valve mean gradient measures 2.8 mmHg. Aortic valve peak gradient measures 5.1 mmHg. Aortic valve area, by VTI measures 3.73 cm. Pulmonic Valve: The pulmonic valve was not well visualized. Pulmonic valve regurgitation is not visualized. No evidence of pulmonic stenosis. Aorta: The aortic root is normal in size and structure and the ascending aorta was not well visualized. Venous: The inferior vena cava was not well visualized. IAS/Shunts: The interatrial septum was not well visualized.  LEFT VENTRICLE PLAX 2D LVIDd:         5.00 cm   Diastology LVIDs:         3.10 cm   LV e' medial:    10.70 cm/s LV PW:         0.90 cm   LV E/e' medial:  7.3 LV IVS:        0.70 cm   LV e' lateral:   16.60 cm/s LVOT diam:     2.40 cm   LV E/e' lateral: 4.7 LV SV:         71 LV SV Index:   33 LVOT Area:     4.52 cm  RIGHT VENTRICLE RV S prime:     13.40 cm/s TAPSE (M-mode): 1.7 cm LEFT ATRIUM           Index        RIGHT ATRIUM          Index LA diam:      4.50 cm 2.07 cm/m   RA Area:     9.98 cm LA Vol (A2C): 26.9 ml 12.38 ml/m  RA Volume:   14.60 ml 6.72 ml/m LA Vol (A4C): 78.1 ml 35.93 ml/m  AORTIC VALVE AV Area (Vmax):    3.05 cm AV Area (Vmean):   2.92 cm AV Area (VTI):     3.73 cm AV Vmax:           113.40 cm/s AV  Vmean:  77.936 cm/s AV VTI:            0.191 m AV Peak Grad:      5.1 mmHg AV Mean Grad:      2.8 mmHg LVOT Vmax:         76.40 cm/s LVOT Vmean:        50.300 cm/s LVOT VTI:          0.158 m LVOT/AV VTI ratio: 0.83  AORTA Ao Root diam: 3.70 cm MITRAL VALVE MV Area (PHT): 4.29 cm     SHUNTS MV Decel Time: 177 msec     Systemic VTI:  0.16 m MR Peak grad: 2.0 mmHg      Systemic Diam: 2.40 cm MR Vmax:      70.60 cm/s MV E velocity: 77.70 cm/s MV A velocity: 113.00 cm/s MV E/A ratio:  0.69 Dina Rich MD Electronically signed by Dina Rich MD Signature Date/Time: 05/10/2023/2:34:12 PM    Final    CT HIP LEFT WO CONTRAST  Result Date: 05/09/2023 CLINICAL DATA:  Left hip pain radiating down the left leg for the past week. No injury. EXAM: CT OF THE LEFT HIP WITHOUT CONTRAST TECHNIQUE: Multidetector CT imaging of the left hip was performed according to the standard protocol. Multiplanar CT image reconstructions were also generated. RADIATION DOSE REDUCTION: This exam was performed according to the departmental dose-optimization program which includes automated exposure control, adjustment of the mA and/or kV according to patient size and/or use of iterative reconstruction technique. COMPARISON:  Left hip x-rays dated May 06, 2023. CT abdomen pelvis dated December 19, 2022. FINDINGS: Bones/Joint/Cartilage No fracture or dislocation. Joint spaces are preserved. No joint effusion. Ligaments Ligaments are suboptimally evaluated by CT. Muscles and Tendons Grossly intact. Soft tissue No fluid collection or hematoma.  No soft tissue mass. Prostatomegaly with distended bladder. Left lateral bladder diverticulum. IMPRESSION: 1. No acute osseous abnormality or significant degenerative changes. Electronically Signed   By: Obie Dredge M.D.   On: 05/09/2023 16:36   DG Hip Unilat W or Wo Pelvis 2-3 Views Left  Result Date: 05/06/2023 CLINICAL DATA:  pain EXAM: DG HIP (WITH OR WITHOUT PELVIS) 2-3V LEFT  COMPARISON:  CT 12/19/2022 FINDINGS: There is no evidence of hip fracture or dislocation. There is no evidence of arthropathy or other focal bone abnormality. Iliac arterial calcifications. Degenerative disc disease L4-5. IMPRESSION: Negative. Electronically Signed   By: Corlis Leak M.D.   On: 05/06/2023 08:54   DG Chest Port 1 View  Result Date: 05/06/2023 CLINICAL DATA:  sob EXAM: PORTABLE CHEST - 1 VIEW COMPARISON:  10/29/2022 FINDINGS: Decrease in left lower lobe infiltrate.  Right lung clear. Heart size and mediastinal contours are within normal limits. Aortic Atherosclerosis (ICD10-170.0). No effusion. CABG markers and sternotomy wires. IMPRESSION: Improving left lower lobe infiltrate. Electronically Signed   By: Corlis Leak M.D.   On: 05/06/2023 08:52     Discharge Exam: Vitals:   05/14/23 2134 05/15/23 0546  BP: 130/72 132/61  Pulse: 78 91  Resp: 20 20  Temp: 99.2 F (37.3 C) 99 F (37.2 C)  SpO2: 95% 94%   Vitals:   05/14/23 1412 05/14/23 1700 05/14/23 2134 05/15/23 0546  BP: 134/77  130/72 132/61  Pulse: 86  78 91  Resp: 20  20 20   Temp: 98.2 F (36.8 C) 99.8 F (37.7 C) 99.2 F (37.3 C) 99 F (37.2 C)  TempSrc: Oral Oral Oral Oral  SpO2: 97%  95% 94%  Weight:  96.9 kg  Height:        General: Pt is alert, awake, not in acute distress Cardiovascular: RRR, S1/S2 +, no rubs, no gallops Respiratory: CTA bilaterally, no wheezing, no rhonchi Abdominal: Soft, NT, ND, bowel sounds + Extremities: no edema, no cyanosis    The results of significant diagnostics from this hospitalization (including imaging, microbiology, ancillary and laboratory) are listed below for reference.     Microbiology: Recent Results (from the past 240 hour(s))  Resp panel by RT-PCR (RSV, Flu A&B, Covid) Anterior Nasal Swab     Status: None   Collection Time: 05/06/23  8:15 AM   Specimen: Anterior Nasal Swab  Result Value Ref Range Status   SARS Coronavirus 2 by RT PCR NEGATIVE NEGATIVE  Final    Comment: (NOTE) SARS-CoV-2 target nucleic acids are NOT DETECTED.  The SARS-CoV-2 RNA is generally detectable in upper respiratory specimens during the acute phase of infection. The lowest concentration of SARS-CoV-2 viral copies this assay can detect is 138 copies/mL. A negative result does not preclude SARS-Cov-2 infection and should not be used as the sole basis for treatment or other patient management decisions. A negative result may occur with  improper specimen collection/handling, submission of specimen other than nasopharyngeal swab, presence of viral mutation(s) within the areas targeted by this assay, and inadequate number of viral copies(<138 copies/mL). A negative result must be combined with clinical observations, patient history, and epidemiological information. The expected result is Negative.  Fact Sheet for Patients:  BloggerCourse.com  Fact Sheet for Healthcare Providers:  SeriousBroker.it  This test is no t yet approved or cleared by the [place] FDA and  has been authorized for detection and/or diagnosis of SARS-CoV-2 by FDA under an Emergency Use Authorization (EUA). This EUA will remain  in effect (meaning this test can be used) for the duration of the COVID-19 declaration under Section 564(b)(1) of the Act, 21 U.S.C.section 360bbb-3(b)(1), unless the authorization is terminated  or revoked sooner.       Influenza A by PCR NEGATIVE NEGATIVE Final   Influenza B by PCR NEGATIVE NEGATIVE Final    Comment: (NOTE) The Xpert Xpress SARS-CoV-2/FLU/RSV plus assay is intended as an aid in the diagnosis of influenza from Nasopharyngeal swab specimens and should not be used as a sole basis for treatment. Nasal washings and aspirates are unacceptable for Xpert Xpress SARS-CoV-2/FLU/RSV testing.  Fact Sheet for Patients: BloggerCourse.com  Fact Sheet for Healthcare  Providers: SeriousBroker.it  This test is not yet approved or cleared by the [place] FDA and has been authorized for detection and/or diagnosis of SARS-CoV-2 by FDA under an Emergency Use Authorization (EUA). This EUA will remain in effect (meaning this test can be used) for the duration of the COVID-19 declaration under Section 564(b)(1) of the Act, 21 U.S.C. section 360bbb-3(b)(1), unless the authorization is terminated or revoked.     Resp Syncytial Virus by PCR NEGATIVE NEGATIVE Final    Comment: (NOTE) Fact Sheet for Patients: BloggerCourse.com  Fact Sheet for Healthcare Providers: SeriousBroker.it  This test is not yet approved or cleared by the [place] FDA and has been authorized for detection and/or diagnosis of SARS-CoV-2 by FDA under an Emergency Use Authorization (EUA). This EUA will remain in effect (meaning this test can be used) for the duration of the COVID-19 declaration under Section 564(b)(1) of the Act, 21 U.S.C. section 360bbb-3(b)(1), unless the authorization is terminated or revoked.  Performed at [facility], [address] [place], [place] [address]  Blood culture (routine x 2)     Status: None   Collection Time: 05/06/23  9:25 AM   Specimen: Right Antecubital; Blood  Result Value Ref Range Status   Specimen Description   Final    RIGHT ANTECUBITAL BOTTLES DRAWN AEROBIC AND ANAEROBIC   Special Requests Blood Culture adequate volume  Final   Culture   Final    NO GROWTH 5 DAYS Performed at [facility], [address], [place], [place] [address]    Report Status 05/11/2023 FINAL  Final  Blood culture (routine x 2)     Status: None   Collection Time: 05/06/23  9:25 AM   Specimen: Left Antecubital; Blood  Result Value Ref Range Status   Specimen Description   Final    LEFT ANTECUBITAL BOTTLES DRAWN AEROBIC AND ANAEROBIC   Special Requests Blood Culture adequate  volume  Final   Culture   Final    NO GROWTH 5 DAYS Performed at [facility], [address] [place], [place] [address]    Report Status 05/11/2023 FINAL  Final  MRSA Next Gen by PCR, Nasal     Status: None   Collection Time: 05/12/23  2:55 PM   Specimen: Nasal Mucosa; Nasal Swab  Result Value Ref Range Status   MRSA by PCR Next Gen NOT DETECTED NOT DETECTED Final    Comment: (NOTE) The GeneXpert MRSA Assay (FDA approved for NASAL specimens only), is one component of a comprehensive MRSA colonization surveillance program. It is not intended to diagnose MRSA infection nor to guide or monitor treatment for MRSA infections. Test performance is not FDA approved in patients less than 68 years old. Performed at [facility], [address] [place], [place] [address]   Culture, blood (Routine X 2) w Reflex to ID Panel     Status: None (Preliminary result)   Collection Time: 05/12/23  3:22 PM   Specimen: BLOOD  Result Value Ref Range Status   Specimen Description BLOOD BLOOD LEFT ARM  Final   Special Requests NONE  Final   Culture   Final    NO GROWTH 2 DAYS Performed at [facility], [address], [place], [place] [address]    Report Status PENDING  Incomplete  Culture, blood (Routine X 2) w Reflex to ID Panel     Status: None (Preliminary result)   Collection Time: 05/12/23  3:22 PM   Specimen: BLOOD  Result Value Ref Range Status   Specimen Description BLOOD BLOOD LEFT HAND  Final   Special Requests NONE  Final   Culture   Final    NO GROWTH 2 DAYS Performed at [facility], [address] [place], [place] [address]    Report Status PENDING  Incomplete     Labs: BNP (last 3 results) No results for input(s): "BNP" in the last 8760 hours. Basic Metabolic Panel: Recent Labs  Lab 05/09/23 0440 05/11/23 0431 05/12/23 0423 05/13/23 0352 05/14/23 0412 05/15/23 0403  NA 131* 128*  --  128* 131* 130*  K 3.7 4.1  --  4.0 4.2 3.8  CL 98 97*  --  96* 98 101  CO2 22 21*  --   23 22 23   GLUCOSE 213* 208*  --  179* 179* 180*  BUN 15 17  --  15 14 16   CREATININE 0.77 0.83 0.91 0.69 0.65 0.65  CALCIUM 8.0* 7.9*  --  7.9* 8.1* 7.8*  MG  --   --   --   --  2.3 2.0   Liver Function Tests: Recent Labs  Lab 05/11/23 1652 05/13/23 0352 05/14/23 0412 05/15/23 0403  AST 80* 85* 89* 63*  ALT 103* 123* 148* 137*  ALKPHOS 79 77 75 66  BILITOT 1.6* 1.3* 1.4* 1.3*  PROT 6.5 6.1* 6.0* 6.0*  ALBUMIN 3.2* 2.9* 2.9* 2.9*   No results for input(s): "LIPASE", "AMYLASE" in the last 168 hours. No results for input(s): "AMMONIA" in the last 168 hours. CBC: Recent Labs  Lab 05/09/23 0440 05/11/23 0540 05/14/23 0412 05/15/23 0403  WBC 2.5* 2.2* 2.5* 2.5*  HGB 10.4* 10.8* 11.8* 10.0*  HCT 28.8* 29.7* 33.6* 27.8*  MCV 100.7* 101.4* 105.0* 101.8*  PLT 50* 57* 56* 76*   Cardiac Enzymes: No results for input(s): "CKTOTAL", "CKMB", "CKMBINDEX", "TROPONINI" in the last 168 hours. BNP: Invalid input(s): "POCBNP" CBG: Recent Labs  Lab 05/14/23 0748 05/14/23 1140 05/14/23 1619 05/14/23 2130 05/15/23 0726  GLUCAP 173* 238* 225* 260* 156*   D-Dimer No results for input(s): "DDIMER" in the last 72 hours. Hgb A1c No results for input(s): "HGBA1C" in the last 72 hours. Lipid Profile No results for input(s): "CHOL", "HDL", "LDLCALC", "TRIG", "CHOLHDL", "LDLDIRECT" in the last 72 hours. Thyroid function studies No results for input(s): "TSH", "T4TOTAL", "T3FREE", "THYROIDAB" in the last 72 hours.  Invalid input(s): "FREET3" Anemia work up No results for input(s): "VITAMINB12", "FOLATE", "FERRITIN", "TIBC", "IRON", "RETICCTPCT" in the last 72 hours. Urinalysis    Component Value Date/Time   COLORURINE YELLOW 05/06/2023 1144   APPEARANCEUR CLEAR 05/06/2023 1144   LABSPEC 1.016 05/06/2023 1144   PHURINE 5.0 05/06/2023 1144   GLUCOSEU >=500 (A) 05/06/2023 1144   HGBUR NEGATIVE 05/06/2023 1144   BILIRUBINUR NEGATIVE 05/06/2023 1144   KETONESUR NEGATIVE 05/06/2023  1144   PROTEINUR NEGATIVE 05/06/2023 1144   NITRITE NEGATIVE 05/06/2023 1144   LEUKOCYTESUR NEGATIVE 05/06/2023 1144   Sepsis Labs Recent Labs  Lab 05/09/23 0440 05/11/23 0540 05/14/23 0412 05/15/23 0403  WBC 2.5* 2.2* 2.5* 2.5*   Microbiology Recent Results (from the past 240 hour(s))  Resp panel by RT-PCR (RSV, Flu A&B, Covid) Anterior Nasal Swab     Status: None   Collection Time: 05/06/23  8:15 AM   Specimen: Anterior Nasal Swab  Result Value Ref Range Status   SARS Coronavirus 2 by RT PCR NEGATIVE NEGATIVE Final    Comment: (NOTE) SARS-CoV-2 target nucleic acids are NOT DETECTED.  The SARS-CoV-2 RNA is generally detectable in upper respiratory specimens during the acute phase of infection. The lowest concentration of SARS-CoV-2 viral copies this assay can detect is 138 copies/mL. A negative result does not preclude SARS-Cov-2 infection and should not be used as the sole basis for treatment or other patient management decisions. A negative result may occur with  improper specimen collection/handling, submission of specimen other than nasopharyngeal swab, presence of viral mutation(s) within the areas targeted by this assay, and inadequate number of viral copies(<138 copies/mL). A negative result must be combined with clinical observations, patient history, and epidemiological information. The expected result is Negative.  Fact Sheet for Patients:  BloggerCourse.com  Fact Sheet for Healthcare Providers:  SeriousBroker.it  This test is no t yet approved or cleared by the [place] FDA and  has been authorized for detection and/or diagnosis of SARS-CoV-2 by FDA under an Emergency Use Authorization (EUA). This EUA will remain  in effect (meaning this test can be used) for the duration of the COVID-19 declaration under Section 564(b)(1) of the Act, 21 U.S.C.section 360bbb-3(b)(1), unless  the authorization is  terminated  or revoked sooner.       Influenza A by PCR NEGATIVE NEGATIVE Final   Influenza B by PCR NEGATIVE NEGATIVE Final    Comment: (NOTE) The Xpert Xpress SARS-CoV-2/FLU/RSV plus assay is intended as an aid in the diagnosis of influenza from Nasopharyngeal swab specimens and should not be used as a sole basis for treatment. Nasal washings and aspirates are unacceptable for Xpert Xpress SARS-CoV-2/FLU/RSV testing.  Fact Sheet for Patients: BloggerCourse.com  Fact Sheet for Healthcare Providers: SeriousBroker.it  This test is not yet approved or cleared by the [place] FDA and has been authorized for detection and/or diagnosis of SARS-CoV-2 by FDA under an Emergency Use Authorization (EUA). This EUA will remain in effect (meaning this test can be used) for the duration of the COVID-19 declaration under Section 564(b)(1) of the Act, 21 U.S.C. section 360bbb-3(b)(1), unless the authorization is terminated or revoked.     Resp Syncytial Virus by PCR NEGATIVE NEGATIVE Final    Comment: (NOTE) Fact Sheet for Patients: BloggerCourse.com  Fact Sheet for Healthcare Providers: SeriousBroker.it  This test is not yet approved or cleared by the [place] FDA and has been authorized for detection and/or diagnosis of SARS-CoV-2 by FDA under an Emergency Use Authorization (EUA). This EUA will remain in effect (meaning this test can be used) for the duration of the COVID-19 declaration under Section 564(b)(1) of the Act, 21 U.S.C. section 360bbb-3(b)(1), unless the authorization is terminated or revoked.  Performed at [facility], [address], [place], [place] [address]   Blood culture (routine x 2)     Status: None   Collection Time: 05/06/23  9:25 AM   Specimen: Right Antecubital; Blood  Result Value Ref Range Status   Specimen Description   Final    RIGHT  ANTECUBITAL BOTTLES DRAWN AEROBIC AND ANAEROBIC   Special Requests Blood Culture adequate volume  Final   Culture   Final    NO GROWTH 5 DAYS Performed at [facility], [address], [place], [place] [address]    Report Status 05/11/2023 FINAL  Final  Blood culture (routine x 2)     Status: None   Collection Time: 05/06/23  9:25 AM   Specimen: Left Antecubital; Blood  Result Value Ref Range Status   Specimen Description   Final    LEFT ANTECUBITAL BOTTLES DRAWN AEROBIC AND ANAEROBIC   Special Requests Blood Culture adequate volume  Final   Culture   Final    NO GROWTH 5 DAYS Performed at [facility], [address], [place], [place] [address]    Report Status 05/11/2023 FINAL  Final  MRSA Next Gen by PCR, Nasal     Status: None   Collection Time: 05/12/23  2:55 PM   Specimen: Nasal Mucosa; Nasal Swab  Result Value Ref Range Status   MRSA by PCR Next Gen NOT DETECTED NOT DETECTED Final    Comment: (NOTE) The GeneXpert MRSA Assay (FDA approved for NASAL specimens only), is one component of a comprehensive MRSA colonization surveillance program. It is not intended to diagnose MRSA infection nor to guide or monitor treatment for MRSA infections. Test performance is not FDA approved in patients less than 27 years old. Performed at [facility], [address], [place], [place] [address]   Culture, blood (Routine X 2) w Reflex to ID Panel     Status: None (Preliminary result)   Collection Time: 05/12/23  3:22 PM   Specimen: BLOOD  Result  Value Ref Range Status   Specimen Description BLOOD BLOOD LEFT ARM  Final   Special Requests NONE  Final   Culture   Final    NO GROWTH 2 DAYS Performed at [facility], [address] [place], [place] [address]    Report Status PENDING  Incomplete  Culture, blood (Routine X 2) w Reflex to ID Panel     Status: None (Preliminary result)   Collection Time: 05/12/23  3:22 PM   Specimen: BLOOD  Result Value Ref Range Status   Specimen Description  BLOOD BLOOD LEFT HAND  Final   Special Requests NONE  Final   Culture   Final    NO GROWTH 2 DAYS Performed at [facility], [address], [place], [place] [address]    Report Status PENDING  Incomplete     Time coordinating discharge: 35 minutes  SIGNED:   Erick Blinks, DO [facility] 05/15/2023, 9:38 AM  If 7PM-7AM, please contact night-coverage [email or web address]

## 2023-05-15 NOTE — Progress Notes (Signed)
Went over discharge instructions w/ pt.  

## 2023-05-16 LAB — FUNGITELL BETA-D-GLUCAN
Fungitell Value:: <31.25 pg/mL
Result Name:: NEGATIVE

## 2023-05-17 LAB — CULTURE, BLOOD (ROUTINE X 2)
Culture: NO GROWTH
Culture: NO GROWTH

## 2023-05-23 DIAGNOSIS — J189 Pneumonia, unspecified organism: Secondary | ICD-10-CM | POA: Diagnosis not present

## 2023-05-23 DIAGNOSIS — N400 Enlarged prostate without lower urinary tract symptoms: Secondary | ICD-10-CM | POA: Diagnosis not present

## 2023-05-23 DIAGNOSIS — I10 Essential (primary) hypertension: Secondary | ICD-10-CM | POA: Diagnosis not present

## 2023-05-23 DIAGNOSIS — E782 Mixed hyperlipidemia: Secondary | ICD-10-CM | POA: Diagnosis not present

## 2023-05-23 DIAGNOSIS — E039 Hypothyroidism, unspecified: Secondary | ICD-10-CM | POA: Diagnosis not present

## 2023-05-23 DIAGNOSIS — E559 Vitamin D deficiency, unspecified: Secondary | ICD-10-CM | POA: Diagnosis not present

## 2023-05-23 DIAGNOSIS — M545 Low back pain, unspecified: Secondary | ICD-10-CM | POA: Diagnosis not present

## 2023-05-23 DIAGNOSIS — D649 Anemia, unspecified: Secondary | ICD-10-CM | POA: Diagnosis not present

## 2023-05-23 DIAGNOSIS — I2510 Atherosclerotic heart disease of native coronary artery without angina pectoris: Secondary | ICD-10-CM | POA: Diagnosis not present

## 2023-05-23 DIAGNOSIS — M4802 Spinal stenosis, cervical region: Secondary | ICD-10-CM | POA: Diagnosis not present

## 2023-05-23 DIAGNOSIS — D696 Thrombocytopenia, unspecified: Secondary | ICD-10-CM | POA: Diagnosis not present

## 2023-05-23 DIAGNOSIS — E1165 Type 2 diabetes mellitus with hyperglycemia: Secondary | ICD-10-CM | POA: Diagnosis not present

## 2023-05-23 DIAGNOSIS — G40309 Generalized idiopathic epilepsy and epileptic syndromes, not intractable, without status epilepticus: Secondary | ICD-10-CM | POA: Diagnosis not present

## 2023-05-27 NOTE — Progress Notes (Unsigned)
GI Office Note    Referring Provider: Benita Stabile, MD Primary Care Physician:  Benita Stabile, MD  Primary Gastroenterologist:  Chief Complaint   No chief complaint on file.    History of Present Illness   Mark Foster is a 67 y.o. male presenting today to discuss scheduling a colonoscopy. On questionnaire, he marked blood in the stool. History of pancreatic cancer s/p distal pancreatectomy and splenectomy in 2020***    Recent admission for community acquired pneumonia, discharged 05/15/23.   Medications   Current Outpatient Medications  Medication Sig Dispense Refill   albuterol (VENTOLIN HFA) 108 (90 Base) MCG/ACT inhaler Inhale into the lungs.     aspirin EC 81 MG tablet Take 1 tablet (81 mg total) by mouth daily with breakfast. 90 tablet 3   atorvastatin (LIPITOR) 80 MG tablet Take 1 tablet (80 mg total) by mouth daily at 6 PM. (Patient taking differently: Take 80 mg by mouth daily.) 90 tablet 1   Cholecalciferol (VITAMIN D3) 25 MCG (1000 UT) capsule Take 1,000 Units by mouth daily.     CINNAMON PO Take by mouth.     cyanocobalamin (VITAMIN B12) 1000 MCG tablet Take 1 tablet (1,000 mcg total) by mouth daily. 90 tablet 3   ECHINACEA COMPLEX PO Take 1,000 mg by mouth daily. (Patient not taking: Reported on 05/07/2023)     ezetimibe (ZETIA) 10 MG tablet Take by mouth.     gabapentin (NEURONTIN) 300 MG capsule Take 1 capsule (300 mg total) by mouth 3 (three) times daily. 270 capsule 3   insulin aspart (NOVOLOG FLEXPEN) 100 UNIT/ML FlexPen Inject 14-20 Units into the skin 3 (three) times daily before meals. (Patient taking differently: Inject 18-24 Units into the skin 3 (three) times daily before meals.) 30 mL 1   Insulin Pen Needle 32G X 4 MM MISC 1 Device by Does not apply route daily. Use as directed to inject insulin four times daily 200 each 2   levothyroxine (SYNTHROID) 50 MCG tablet TAKE 1 TABLET EVERY DAY 90 tablet 1   meloxicam (MOBIC) 15 MG tablet Take 15 mg by  mouth daily.     metoprolol succinate (TOPROL-XL) 25 MG 24 hr tablet Take 25 mg by mouth daily.     Multiple Vitamin (MULTIVITAMIN WITH MINERALS) TABS tablet Take 1 tablet by mouth daily. 100 tablet 2   nitroGLYCERIN (NITROSTAT) 0.4 MG SL tablet Place 1 tablet (0.4 mg total) under the tongue every 5 (five) minutes as needed for chest pain. 25 tablet 3   Omega-3 1000 MG CAPS Take 1,000 mg by mouth daily.     tamsulosin (FLOMAX) 0.4 MG CAPS capsule Take 1 capsule (0.4 mg total) by mouth daily after supper. 90 capsule 3   TOUJEO MAX SOLOSTAR 300 UNIT/ML Solostar Pen INJECT 60 UNITS INTO THE SKIN AT BEDTIME. 6 mL 0   zinc sulfate 220 (50 Zn) MG capsule Take 1 capsule (220 mg total) by mouth daily. 90 capsule 3   No current facility-administered medications for this visit.   Facility-Administered Medications Ordered in Other Visits  Medication Dose Route Frequency Provider Last Rate Last Admin   0.9 %  sodium chloride infusion   Intravenous Continuous Doreatha Massed, MD 20 mL/hr at 12/30/18 1351 New Bag at 12/30/18 1351   0.9 %  sodium chloride infusion   Intravenous Continuous Doreatha Massed, MD 20 mL/hr at 12/30/18 1401 New Bag at 12/30/18 1401    Allergies   Allergies as of 05/28/2023 -  Review Complete 05/07/2023  Allergen Reaction Noted   Demerol [meperidine hcl] Other (See Comments) 01/20/2018    Past Medical History   Past Medical History:  Diagnosis Date   Bilateral carpal tunnel syndrome 10/19/2020   CAD (coronary artery disease)    a. 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    Type 2 diabetes mellitus (HCC)     Past Surgical History   Past Surgical History:  Procedure Laterality Date   APPENDECTOMY     BUBBLE STUDY N/A 11/18/2019   Procedure: BUBBLE STUDY;  Surgeon: Laqueta Linden, MD;  Location: [facility];  Service: Cardiology;  Laterality: N/A;   CARDIAC SURGERY     COLON RESECTION     For diverticulitis   COLON SURGERY     CORONARY ARTERY BYPASS GRAFT N/A 01/26/2020   Procedure: CORONARY ARTERY BYPASS GRAFTING (CABG) TIMES FOUR USING LEFT INTERNAL MAMMARY VEIN AND RIGHT GREATER SAPHENOUS VEIN;  Surgeon: Loreli Slot, MD;  Location: [facility] OR;  Service: Open Heart Surgery;  Laterality: N/A;   ENDOVEIN HARVEST OF GREATER SAPHENOUS VEIN Right 01/26/2020   Procedure: Mack Guise Of Greater Saphenous Vein;  Surgeon: Loreli Slot, MD;  Location: [facility] OR;  Service: Open Heart Surgery;  Laterality: Right;   ESOPHAGOGASTRODUODENOSCOPY N/A 10/03/2018   Procedure: ESOPHAGOGASTRODUODENOSCOPY (EGD);  Surgeon: Rachael Fee, MD;  Location: [facility];  Service: Endoscopy;  Laterality: N/A;   EUS N/A 10/03/2018   Procedure: UPPER ENDOSCOPIC ULTRASOUND (EUS) RADIAL;  Surgeon: Rachael Fee, MD;  Location: [facility];  Service: Endoscopy;  Laterality: N/A;   EYE SURGERY Bilateral    FINE NEEDLE ASPIRATION N/A 10/03/2018   Procedure: FINE NEEDLE ASPIRATION (FNA) LINEAR;  Surgeon: Rachael Fee, MD;  Location: [facility];  Service: Endoscopy;  Laterality: N/A;   IR ANGIO INTRA EXTRACRAN SEL COM CAROTID INNOMINATE BILAT MOD SED  01/21/2018   IR ANGIO INTRA EXTRACRAN SEL COM CAROTID INNOMINATE UNI R MOD SED  03/21/2018   IR ANGIO VERTEBRAL SEL VERTEBRAL BILAT MOD SED  01/21/2018   IR TRANSCATH EXCRAN VERT OR CAR A STENT  03/21/2018   LEFT HEART CATH AND CORONARY ANGIOGRAPHY N/A 01/08/2020   Procedure: LEFT HEART CATH AND CORONARY ANGIOGRAPHY;  Surgeon: Marykay Lex, MD;  Location: [facility] INVASIVE CV LAB;  Service: Cardiovascular;  Laterality: N/A;    PORT-A-CATH REMOVAL N/A 06/20/2021   Procedure: MINOR REMOVAL PORT-A-CATH;  Surgeon: Lucretia Roers, MD;  Location: [facility];  Service: General;  Laterality: N/A;   PORTACATH PLACEMENT Left 12/23/2018   Procedure: INSERTION PORT-A-CATH;  Surgeon: Lucretia Roers, MD;  Location: [facility];  Service: General;  Laterality: Left;   RADIOLOGY WITH ANESTHESIA N/A 03/21/2018   Procedure: IR WITH ANESTHESIA WITH STENT PLACEMENT;  Surgeon: Julieanne Cotton, MD;  Location: [facility];  Service: Radiology;  Laterality: N/A;   ROTATOR CUFF REPAIR     Left   SPLENECTOMY     TEE WITHOUT CARDIOVERSION N/A 11/18/2019   Procedure: TRANSESOPHAGEAL ECHOCARDIOGRAM (TEE) WITH PROPOFOL;  Surgeon: Prentice Docker  A, MD;  Location: [facility];  Service: Cardiology;  Laterality: N/A;   TEE WITHOUT CARDIOVERSION N/A 01/26/2020   Procedure: TRANSESOPHAGEAL ECHOCARDIOGRAM (TEE);  Surgeon: Loreli Slot, MD;  Location: [facility] OR;  Service: Open Heart Surgery;  Laterality: N/A;   TONSILLECTOMY      Past Family History   Family History  Problem Relation Age of Onset   Stroke Mother    Pancreatic cancer Father 54       d. 32   Stroke Maternal Grandmother    Heart attack Maternal Grandfather    Heart Problems Paternal Grandfather    Scoliosis Daughter    Muscular dystrophy Grandson     Past Social History   Social History   Socioeconomic History   Marital status: Married    Spouse name: Not on file   Number of children: 2   Years of education: Not on file   Highest education level: Not on file  Occupational History   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  Tobacco Use   Smoking status: Former    Current packs/day: 0.25    Average packs/day: 0.3 packs/day for 3.6 years (0.9 ttl pk-yrs)    Types: Cigarettes    Start date: 10/06/2019   Smokeless tobacco: Never  Vaping Use   Vaping status: Never Used  Substance and Sexual Activity   Alcohol use: Not Currently    Alcohol/week: 1.0 standard drink of alcohol     Types: 1 Cans of beer per week   Drug use: Never   Sexual activity: Not on file  Other Topics Concern   Not on file  Social History Narrative   Caffiene decaff (2-3 cups daily),  soda (dependent on blood glucose)   Married,  3 children - 4 grandchildren.      Social Determinants of Health   Financial Resource Strain: Medium Risk (12/18/2018)   Overall Financial Resource Strain (CARDIA)    Difficulty of Paying Living Expenses: Somewhat hard  Food Insecurity: No Food Insecurity (05/06/2023)   Hunger Vital Sign    Worried About Running Out of Food in the Last Year: Never true    Ran Out of Food in the Last Year: Never true  Transportation Needs: No Transportation Needs (05/06/2023)   PRAPARE - Administrator, Civil Service (Medical): No    Lack of Transportation (Non-Medical): No  Physical Activity: Inactive (12/18/2018)   Exercise Vital Sign    Days of Exercise per Week: 0 days    Minutes of Exercise per Session: 0 min  Stress: Stress Concern Present (12/18/2018)   [employer] of Occupational Health - Occupational Stress Questionnaire    Feeling of Stress : To some extent  Social Connections: Somewhat Isolated (12/18/2018)   Social Connection and Isolation Panel [NHANES]    Frequency of Communication with Friends and Family: More than three times a week    Frequency of Social Gatherings with Friends and Family: Once a week    Attends Religious Services: More than 4 times per year    Active Member of [employer] or Organizations: No    Attends Banker Meetings: Never    Marital Status: Divorced  Catering manager Violence: Not At Risk (05/06/2023)   Humiliation, Afraid, Rape, and Kick questionnaire    Fear of Current or Ex-Partner: No    Emotionally Abused: No    Physically Abused: No    Sexually Abused: No    Review of Systems   General: Negative for anorexia, weight loss, fever, chills,  fatigue, weakness. Eyes: Negative for vision changes.  ENT: Negative  for hoarseness, difficulty swallowing , nasal congestion. CV: Negative for chest pain, angina, palpitations, dyspnea on exertion, peripheral edema.  Respiratory: Negative for dyspnea at rest, dyspnea on exertion, cough, sputum, wheezing.  GI: See history of present illness. GU:  Negative for dysuria, hematuria, urinary incontinence, urinary frequency, nocturnal urination.  MS: Negative for joint pain, low back pain.  Derm: Negative for rash or itching.  Neuro: Negative for weakness, abnormal sensation, seizure, frequent headaches, memory loss,  confusion.  Psych: Negative for anxiety, depression, suicidal ideation, hallucinations.  Endo: Negative for unusual weight change.  Heme: Negative for bruising or bleeding. Allergy: Negative for rash or hives.  Physical Exam   There were no vitals taken for this visit.   General: Well-nourished, well-developed in no acute distress.  Head: Normocephalic, atraumatic.   Eyes: Conjunctiva pink, no icterus. Mouth: Oropharyngeal mucosa moist and pink , no lesions erythema or exudate. Neck: Supple without thyromegaly, masses, or lymphadenopathy.  Lungs: Clear to auscultation bilaterally.  Heart: Regular rate and rhythm, no murmurs rubs or gallops.  Abdomen: Bowel sounds are normal, nontender, nondistended, no hepatosplenomegaly or masses,  no abdominal bruits or hernia, no rebound or guarding.   Rectal: *** Extremities: No lower extremity edema. No clubbing or deformities.  Neuro: Alert and oriented x 4 , grossly normal neurologically.  Skin: Warm and dry, no rash or jaundice.   Psych: Alert and cooperative, normal mood and affect.  Labs   *** Imaging Studies   CT CHEST ABDOMEN PELVIS W CONTRAST  Result Date: 05/11/2023 CLINICAL DATA:  Pneumonia, chills. Concern for sepsis. History of pancreatic cancer status post distal pancreatectomy and splenectomy. EXAM: CT CHEST, ABDOMEN, AND PELVIS WITH CONTRAST TECHNIQUE: Multidetector CT imaging of the  chest, abdomen and pelvis was performed following the standard protocol during bolus administration of intravenous contrast. RADIATION DOSE REDUCTION: This exam was performed according to the departmental dose-optimization program which includes automated exposure control, adjustment of the mA and/or kV according to patient size and/or use of iterative reconstruction technique. CONTRAST:  OMNIPAQUE IOHEXOL 300 MG/ML  SOLN COMPARISON:  CT abdomen pelvis dated 12/19/2022. FINDINGS: CT CHEST FINDINGS Cardiovascular: There is no cardiomegaly or pericardial effusion. Advanced three-vessel coronary vascular calcification and postsurgical changes of CABG. Mild atherosclerotic calcification of the thoracic aorta. No aneurysmal dilatation or dissection. The origins of the great vessels of the aortic arch and the central pulmonary arteries appear patent for the degree of opacification. Mediastinum/Nodes: No hilar or mediastinal adenopathy. The esophagus is grossly unremarkable. No mediastinal fluid collection. Lungs/Pleura: Mild centrilobular emphysema. A 5 mm left upper lobe nodule (46/5), not seen on the CT are 12/18/2019. There is trace left pleural effusion. Minimal bibasilar atelectasis. No focal consolidation or pneumothorax. The central airways are patent. Musculoskeletal: Median sternotomy wires. No acute osseous pathology. CT ABDOMEN PELVIS FINDINGS No intra-abdominal free air or free fluid. Hepatobiliary: Mild fatty liver. No biliary dilatation. There is gallstone. No pericholecystic fluid or evidence of acute cholecystitis by CT. Pancreas: Postsurgical changes of distal pancreatectomy. The visualized head and uncinate process of the pancreas appear unremarkable. 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 Spleen: Splenectomy. Adrenals/Urinary Tract: The adrenal glands are unremarkable. There is no hydronephrosis on either  side. There is symmetric enhancement and excretion of contrast by both kidneys. Mild bilateral perinephric stranding, nonspecific. Correlation with urinalysis recommended to exclude UTI. The visualized ureters  appear unremarkable. There is mild trabeculated appearance of the bladder wall which may be related to chronic bladder outlet obstruction. The urinary bladder is otherwise unremarkable. Stomach/Bowel: There is moderate stool throughout the colon. There is distal colonic diverticulosis without active inflammatory changes. There is a 2 cm distal duodenal diverticulum without active inflammation. There is no bowel obstruction or active inflammation. Appendectomy. Vascular/Lymphatic: Moderate aortoiliac atherosclerotic disease. The IVC is unremarkable. No portal venous gas. There is no adenopathy. Reproductive: The prostate and seminal vesicles are grossly unremarkable. No pelvic mass. Other: Asymmetric atrophy of the right rectus abdominus muscle. Small fat containing umbilical hernia as well as several fat containing left ventral upper abdominal hernia is similar to prior CT. No bowel herniation or fluid collection. Musculoskeletal: No acute osseous pathology. IMPRESSION: 1. No acute intrathoracic, abdominal, or pelvic pathology. 2. Trace left pleural effusion. 3. A 5 mm left upper lobe nodule, not seen on the CT are 12/18/2019. 4. Mild fatty liver. 5. Cholelithiasis. 6. Postsurgical changes of distal pancreatectomy and splenectomy. No CT findings of recurrent disease. 7. Colonic diverticulosis. No bowel obstruction. 8. Aortic Atherosclerosis (ICD10-I70.0) and Emphysema (ICD10-J43.9). Electronically Signed   By: Elgie Collard M.D.   On: 05/11/2023 19:35   ECHOCARDIOGRAM COMPLETE  Result Date: 05/10/2023    ECHOCARDIOGRAM REPORT   Patient Name:   Mark Foster Date of Exam: 05/10/2023 Medical Rec #:  [id number]      Height:       70.0 in Accession #:    [id number]     Weight:       220.0 lb Date of Birth:   [birth year]       BSA:          2.174 m Patient Age:    67 years       BP:           144/74 mmHg Patient Gender: M              HR:           97 bpm. Exam Location:  Inpatient Procedure: 2D Echo, Cardiac Doppler, Color Doppler and Intracardiac            Opacification Agent Indications:    Fever R50.9  History:        Patient has prior history of Echocardiogram examinations, most                 recent 11/12/2018. CAD, CAP and Carotid Disease; Risk                 Factors:Hypertension, Dyslipidemia, Diabetes and Former Smoker.  Sonographer:    Aron Baba Referring Phys: [phone] CARLOS MADERA  Sonographer Comments: Technically difficult study due to poor echo windows. IMPRESSIONS  1. Left ventricular ejection fraction, by estimation, is 60 to 65%. The left ventricle has normal function. The left ventricle has no regional wall motion abnormalities. Left ventricular diastolic parameters are consistent with Grade I diastolic dysfunction (impaired relaxation).  2. Right ventricular systolic function is normal. The right ventricular size is normal. Tricuspid regurgitation signal is inadequate for assessing PA pressure.  3. Left atrial size was mildly dilated.  4. The mitral valve is normal in structure. No evidence of mitral valve regurgitation. No evidence of mitral stenosis.  5. The aortic valve has an indeterminant number of cusps. Aortic valve regurgitation is not visualized. No aortic stenosis is present. FINDINGS  Left Ventricle: Left ventricular ejection fraction, by estimation, is 60 to 65%. The left  ventricle has normal function. The left ventricle has no regional wall motion abnormalities. The left ventricular internal cavity size was normal in size. There is  no left ventricular hypertrophy. Left ventricular diastolic parameters are consistent with Grade I diastolic dysfunction (impaired relaxation). Normal left ventricular filling pressure. Right Ventricle: The right ventricular size is normal. Right vetricular  wall thickness was not well visualized. Right ventricular systolic function is normal. Tricuspid regurgitation signal is inadequate for assessing PA pressure. Left Atrium: Left atrial size was mildly dilated. Right Atrium: Right atrial size was normal in size. Pericardium: There is no evidence of pericardial effusion. Mitral Valve: The mitral valve is normal in structure. There is mild thickening of the mitral valve leaflet(s). There is mild calcification of the mitral valve leaflet(s). Mild mitral annular calcification. No evidence of mitral valve regurgitation. No evidence of mitral valve stenosis. Tricuspid Valve: The tricuspid valve is normal in structure. Tricuspid valve regurgitation is not demonstrated. No evidence of tricuspid stenosis. Aortic Valve: The aortic valve has an indeterminant number of cusps. Aortic valve regurgitation is not visualized. No aortic stenosis is present. Aortic valve mean gradient measures 2.8 mmHg. Aortic valve peak gradient measures 5.1 mmHg. Aortic valve area, by VTI measures 3.73 cm. Pulmonic Valve: The pulmonic valve was not well visualized. Pulmonic valve regurgitation is not visualized. No evidence of pulmonic stenosis. Aorta: The aortic root is normal in size and structure and the ascending aorta was not well visualized. Venous: The inferior vena cava was not well visualized. IAS/Shunts: The interatrial septum was not well visualized.  LEFT VENTRICLE PLAX 2D LVIDd:         5.00 cm   Diastology LVIDs:         3.10 cm   LV e' medial:    10.70 cm/s LV PW:         0.90 cm   LV E/e' medial:  7.3 LV IVS:        0.70 cm   LV e' lateral:   16.60 cm/s LVOT diam:     2.40 cm   LV E/e' lateral: 4.7 LV SV:         71 LV SV Index:   33 LVOT Area:     4.52 cm  RIGHT VENTRICLE RV S prime:     13.40 cm/s TAPSE (M-mode): 1.7 cm LEFT ATRIUM           Index        RIGHT ATRIUM          Index LA diam:      4.50 cm 2.07 cm/m   RA Area:     9.98 cm LA Vol (A2C): 26.9 ml 12.38 ml/m  RA  Volume:   14.60 ml 6.72 ml/m LA Vol (A4C): 78.1 ml 35.93 ml/m  AORTIC VALVE AV Area (Vmax):    3.05 cm AV Area (Vmean):   2.92 cm AV Area (VTI):     3.73 cm AV Vmax:           113.40 cm/s AV Vmean:          77.936 cm/s AV VTI:            0.191 m AV Peak Grad:      5.1 mmHg AV Mean Grad:      2.8 mmHg LVOT Vmax:         76.40 cm/s LVOT Vmean:        50.300 cm/s LVOT VTI:  0.158 m LVOT/AV VTI ratio: 0.83  AORTA Ao Root diam: 3.70 cm MITRAL VALVE MV Area (PHT): 4.29 cm     SHUNTS MV Decel Time: 177 msec     Systemic VTI:  0.16 m MR Peak grad: 2.0 mmHg      Systemic Diam: 2.40 cm MR Vmax:      70.60 cm/s MV E velocity: 77.70 cm/s MV A velocity: 113.00 cm/s MV E/A ratio:  0.69 Dina Rich MD Electronically signed by Dina Rich MD Signature Date/Time: 05/10/2023/2:34:12 PM    Final    CT HIP LEFT WO CONTRAST  Result Date: 05/09/2023 CLINICAL DATA:  Left hip pain radiating down the left leg for the past week. No injury. EXAM: CT OF THE LEFT HIP WITHOUT CONTRAST TECHNIQUE: Multidetector CT imaging of the left hip was performed according to the standard protocol. Multiplanar CT image reconstructions were also generated. RADIATION DOSE REDUCTION: This exam was performed according to the departmental dose-optimization program which includes automated exposure control, adjustment of the mA and/or kV according to patient size and/or use of iterative reconstruction technique. COMPARISON:  Left hip x-rays dated May 06, 2023. CT abdomen pelvis dated December 19, 2022. FINDINGS: Bones/Joint/Cartilage No fracture or dislocation. Joint spaces are preserved. No joint effusion. Ligaments Ligaments are suboptimally evaluated by CT. Muscles and Tendons Grossly intact. Soft tissue No fluid collection or hematoma.  No soft tissue mass. Prostatomegaly with distended bladder. Left lateral bladder diverticulum. IMPRESSION: 1. No acute osseous abnormality or significant degenerative changes. Electronically Signed   By:  Obie Dredge M.D.   On: 05/09/2023 16:36   DG Hip Unilat W or Wo Pelvis 2-3 Views Left  Result Date: 05/06/2023 CLINICAL DATA:  pain EXAM: DG HIP (WITH OR WITHOUT PELVIS) 2-3V LEFT COMPARISON:  CT 12/19/2022 FINDINGS: There is no evidence of hip fracture or dislocation. There is no evidence of arthropathy or other focal bone abnormality. Iliac arterial calcifications. Degenerative disc disease L4-5. IMPRESSION: Negative. Electronically Signed   By: Corlis Leak M.D.   On: 05/06/2023 08:54   DG Chest Port 1 View  Result Date: 05/06/2023 CLINICAL DATA:  sob EXAM: PORTABLE CHEST - 1 VIEW COMPARISON:  10/29/2022 FINDINGS: Decrease in left lower lobe infiltrate.  Right lung clear. Heart size and mediastinal contours are within normal limits. Aortic Atherosclerosis (ICD10-170.0). No effusion. CABG markers and sternotomy wires. IMPRESSION: Improving left lower lobe infiltrate. Electronically Signed   By: Corlis Leak M.D.   On: 05/06/2023 08:52    Assessment       PLAN   ***   Leanna Battles. Melvyn Neth, MHS, PA-C [facility]

## 2023-05-28 ENCOUNTER — Telehealth: Payer: Self-pay | Admitting: *Deleted

## 2023-05-28 ENCOUNTER — Ambulatory Visit (HOSPITAL_COMMUNITY)
Admission: RE | Admit: 2023-05-28 | Discharge: 2023-05-28 | Disposition: A | Discharge status: Home / Self Care | Payer: PPO | Source: Ambulatory Visit | Attending: Gastroenterology | Admitting: Gastroenterology

## 2023-05-28 ENCOUNTER — Ambulatory Visit: Payer: PPO | Admitting: Gastroenterology

## 2023-05-28 ENCOUNTER — Encounter: Payer: Self-pay | Admitting: Gastroenterology

## 2023-05-28 VITALS — BP 110/68 | HR 80 | Temp 98.1°F | Ht 70.0 in | Wt 211.4 lb

## 2023-05-28 DIAGNOSIS — D61818 Other pancytopenia: Secondary | ICD-10-CM | POA: Diagnosis not present

## 2023-05-28 DIAGNOSIS — R7989 Other specified abnormal findings of blood chemistry: Secondary | ICD-10-CM | POA: Insufficient documentation

## 2023-05-28 DIAGNOSIS — K8020 Calculus of gallbladder without cholecystitis without obstruction: Secondary | ICD-10-CM | POA: Diagnosis not present

## 2023-05-28 DIAGNOSIS — R1032 Left lower quadrant pain: Secondary | ICD-10-CM | POA: Diagnosis not present

## 2023-05-28 DIAGNOSIS — R188 Other ascites: Secondary | ICD-10-CM | POA: Diagnosis not present

## 2023-05-28 DIAGNOSIS — K6289 Other specified diseases of anus and rectum: Secondary | ICD-10-CM | POA: Insufficient documentation

## 2023-05-28 DIAGNOSIS — R1012 Left upper quadrant pain: Secondary | ICD-10-CM | POA: Insufficient documentation

## 2023-05-28 DIAGNOSIS — K439 Ventral hernia without obstruction or gangrene: Secondary | ICD-10-CM | POA: Diagnosis not present

## 2023-05-28 DIAGNOSIS — C259 Malignant neoplasm of pancreas, unspecified: Secondary | ICD-10-CM | POA: Diagnosis not present

## 2023-05-28 MED ORDER — LINACLOTIDE 290 MCG PO CAPS: 290.0000 ug | ORAL_CAPSULE | Freq: Every day | ORAL | 3 refills | Status: AC

## 2023-05-28 MED ORDER — HYDROCORTISONE (PERIANAL) 2.5 % EX CREA: 1.0000 | TOPICAL_CREAM | Freq: Two times a day (BID) | CUTANEOUS | 1 refills | Status: AC

## 2023-05-28 MED ORDER — IOHEXOL 9 MG/ML PO SOLN
ORAL | Status: AC
  Filled 2023-05-28: qty 500

## 2023-05-28 MED ORDER — IOHEXOL 300 MG/ML  SOLN
100.0000 mL | Freq: Once | INTRAMUSCULAR | Status: AC | PRN
  Administered 2023-05-28: 100 mL via INTRAVENOUS

## 2023-05-28 NOTE — Telephone Encounter (Signed)
LMOVM to head to CT at [facility] radiology now. Patient left office while getting scheduled.

## 2023-05-28 NOTE — Patient Instructions (Signed)
CT scan of your abdomen to be completed today. If no acute findings, then we will start a good bowel regimen.  Hemorrhoid cream sent to your pharmacy.

## 2023-05-28 NOTE — Addendum Note (Signed)
Addended by: Tiffany Kocher on: 05/28/2023 05:01 PM   Modules accepted: Orders

## 2023-05-31 DIAGNOSIS — M4802 Spinal stenosis, cervical region: Secondary | ICD-10-CM | POA: Diagnosis not present

## 2023-05-31 DIAGNOSIS — Z23 Encounter for immunization: Secondary | ICD-10-CM | POA: Diagnosis not present

## 2023-05-31 DIAGNOSIS — M545 Low back pain, unspecified: Secondary | ICD-10-CM | POA: Diagnosis not present

## 2023-05-31 DIAGNOSIS — I2510 Atherosclerotic heart disease of native coronary artery without angina pectoris: Secondary | ICD-10-CM | POA: Diagnosis not present

## 2023-05-31 DIAGNOSIS — E782 Mixed hyperlipidemia: Secondary | ICD-10-CM | POA: Diagnosis not present

## 2023-05-31 DIAGNOSIS — E039 Hypothyroidism, unspecified: Secondary | ICD-10-CM | POA: Diagnosis not present

## 2023-05-31 DIAGNOSIS — D696 Thrombocytopenia, unspecified: Secondary | ICD-10-CM | POA: Diagnosis not present

## 2023-05-31 DIAGNOSIS — N400 Enlarged prostate without lower urinary tract symptoms: Secondary | ICD-10-CM | POA: Diagnosis not present

## 2023-05-31 DIAGNOSIS — I10 Essential (primary) hypertension: Secondary | ICD-10-CM | POA: Diagnosis not present

## 2023-05-31 DIAGNOSIS — G40309 Generalized idiopathic epilepsy and epileptic syndromes, not intractable, without status epilepticus: Secondary | ICD-10-CM | POA: Diagnosis not present

## 2023-05-31 DIAGNOSIS — E1169 Type 2 diabetes mellitus with other specified complication: Secondary | ICD-10-CM | POA: Diagnosis not present

## 2023-05-31 DIAGNOSIS — D649 Anemia, unspecified: Secondary | ICD-10-CM | POA: Diagnosis not present

## 2023-06-05 ENCOUNTER — Encounter: Payer: Self-pay | Admitting: *Deleted

## 2023-06-05 ENCOUNTER — Other Ambulatory Visit: Payer: Self-pay | Admitting: *Deleted

## 2023-06-05 MED ORDER — PEG 3350-KCL-NA BICARB-NACL 420 G PO SOLR: 4000.0000 mL | Freq: Once | ORAL | 0 refills | Status: AC

## 2023-06-21 ENCOUNTER — Inpatient Hospital Stay: Payer: PPO | Attending: Hematology

## 2023-06-21 ENCOUNTER — Ambulatory Visit (HOSPITAL_COMMUNITY)
Admission: RE | Admit: 2023-06-21 | Discharge: 2023-06-21 | Disposition: A | Discharge status: Home / Self Care | Payer: PPO | Source: Ambulatory Visit | Attending: Hematology | Admitting: Hematology

## 2023-06-21 DIAGNOSIS — K5730 Diverticulosis of large intestine without perforation or abscess without bleeding: Secondary | ICD-10-CM | POA: Insufficient documentation

## 2023-06-21 DIAGNOSIS — Z9221 Personal history of antineoplastic chemotherapy: Secondary | ICD-10-CM | POA: Diagnosis not present

## 2023-06-21 DIAGNOSIS — Z87891 Personal history of nicotine dependence: Secondary | ICD-10-CM | POA: Diagnosis not present

## 2023-06-21 DIAGNOSIS — D696 Thrombocytopenia, unspecified: Secondary | ICD-10-CM | POA: Insufficient documentation

## 2023-06-21 DIAGNOSIS — Z800 Family history of malignant neoplasm of digestive organs: Secondary | ICD-10-CM | POA: Diagnosis not present

## 2023-06-21 DIAGNOSIS — Z8507 Personal history of malignant neoplasm of pancreas: Secondary | ICD-10-CM | POA: Diagnosis not present

## 2023-06-21 DIAGNOSIS — K8020 Calculus of gallbladder without cholecystitis without obstruction: Secondary | ICD-10-CM | POA: Insufficient documentation

## 2023-06-21 DIAGNOSIS — C259 Malignant neoplasm of pancreas, unspecified: Secondary | ICD-10-CM

## 2023-06-21 DIAGNOSIS — Z9081 Acquired absence of spleen: Secondary | ICD-10-CM | POA: Diagnosis not present

## 2023-06-21 DIAGNOSIS — I700 Atherosclerosis of aorta: Secondary | ICD-10-CM | POA: Insufficient documentation

## 2023-06-21 DIAGNOSIS — N400 Enlarged prostate without lower urinary tract symptoms: Secondary | ICD-10-CM | POA: Diagnosis not present

## 2023-06-21 DIAGNOSIS — D649 Anemia, unspecified: Secondary | ICD-10-CM | POA: Insufficient documentation

## 2023-06-21 LAB — COMPREHENSIVE METABOLIC PANEL
ALT: 23 U/L (ref 0–44)
AST: 22 U/L (ref 15–41)
Albumin: 4.2 g/dL (ref 3.5–5.0)
Alkaline Phosphatase: 53 U/L (ref 38–126)
Anion gap: 8 (ref 5–15)
BUN: 17 mg/dL (ref 8–23)
CO2: 24 mmol/L (ref 22–32)
Calcium: 9.2 mg/dL (ref 8.9–10.3)
Chloride: 101 mmol/L (ref 98–111)
Creatinine, Ser: 0.73 mg/dL (ref 0.61–1.24)
GFR, Estimated: >60 mL/min (ref >60)
Glucose, Bld: 286 mg/dL — ABNORMAL HIGH (ref 70–99)
Potassium: 4.3 mmol/L (ref 3.5–5.1)
Sodium: 133 mmol/L — ABNORMAL LOW (ref 135–145)
Total Bilirubin: 1.6 mg/dL — ABNORMAL HIGH (ref 0.3–1.2)
Total Protein: 6.8 g/dL (ref 6.5–8.1)

## 2023-06-21 LAB — CBC WITH DIFFERENTIAL/PLATELET
Abs Immature Granulocytes: 0.01 10*3/uL (ref 0.00–0.07)
Basophils Absolute: 0 10*3/uL (ref 0.0–0.1)
Basophils Relative: 0 %
Eosinophils Absolute: 0.1 10*3/uL (ref 0.0–0.5)
Eosinophils Relative: 2 %
HCT: 34.3 % — ABNORMAL LOW (ref 39.0–52.0)
Hemoglobin: 11.9 g/dL — ABNORMAL LOW (ref 13.0–17.0)
Immature Granulocytes: 0 %
Lymphocytes Relative: 57 %
Lymphs Abs: 2.6 10*3/uL (ref 0.7–4.0)
MCH: 36.8 pg — ABNORMAL HIGH (ref 26.0–34.0)
MCHC: 34.7 g/dL (ref 30.0–36.0)
MCV: 106.2 fL — ABNORMAL HIGH (ref 80.0–100.0)
Monocytes Absolute: 0.3 10*3/uL (ref 0.1–1.0)
Monocytes Relative: 6 %
Neutro Abs: 1.6 10*3/uL — ABNORMAL LOW (ref 1.7–7.7)
Neutrophils Relative %: 35 %
Platelets: 108 10*3/uL — ABNORMAL LOW (ref 150–400)
RBC: 3.23 MIL/uL — ABNORMAL LOW (ref 4.22–5.81)
RDW: 15.9 % — ABNORMAL HIGH (ref 11.5–15.5)
WBC: 4.5 10*3/uL (ref 4.0–10.5)
nRBC: 0 % (ref 0.0–0.2)

## 2023-06-21 MED ORDER — IOHEXOL 300 MG/ML  SOLN
100.0000 mL | Freq: Once | INTRAMUSCULAR | Status: AC | PRN
  Administered 2023-06-21: 100 mL via INTRAVENOUS

## 2023-06-22 LAB — CANCER ANTIGEN 19-9: CA 19-9: 13 U/mL (ref 0–35)

## 2023-06-28 ENCOUNTER — Inpatient Hospital Stay: Payer: PPO | Admitting: Hematology

## 2023-06-28 ENCOUNTER — Encounter: Payer: Self-pay | Admitting: Hematology

## 2023-06-28 DIAGNOSIS — Z8507 Personal history of malignant neoplasm of pancreas: Secondary | ICD-10-CM | POA: Diagnosis not present

## 2023-06-28 DIAGNOSIS — C259 Malignant neoplasm of pancreas, unspecified: Secondary | ICD-10-CM | POA: Diagnosis not present

## 2023-06-28 NOTE — Progress Notes (Signed)
[facility] [address], [place] [address]    Clinic Day:  06/28/2023  Referring physician: Benita Stabile, MD  Patient Care Team: Benita Stabile, MD as PCP - General (Internal Medicine) Jonelle Sidle, MD as PCP - Cardiology (Cardiology) Doreatha Massed, MD as Consulting Physician (Hematology and Oncology)   ASSESSMENT & PLAN:   Assessment: 1.  Stage IIb (T2N1) pancreatic adenocarcinoma: -Status post distal pancreatectomy and splenectomy at [facility] by Dr. Freida Busman on 11/20/2018. 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 -12 cycles of FOLFIRINOX from 12/30/2018 through 06/04/2019. -MRI of the brain on 05/19/2019 did not show any abnormalities.  This was done because of new onset numbness in the left lower lip during therapy. -CT CAP on 03/24/2020 shows unchanged low-attenuation lesion of the pancreatic uncinate measuring 1.2 x 0.8 cm.  Unchanged low-attenuation lesion near the surgical margin of the pancreatic head and neck junction measuring 1.2 x 0.9 cm.  These may reflect postoperative seroma.  Unchanged prominent retroperitoneal lymph nodes.   2.  Post splenectomy state: -He received vaccination prior to splenectomy.    Plan: 1.  Stage IIb (T2N1) pancreatic adenocarcinoma: - Denies any abdominal pains or other signs or symptoms of recurrence. - Reviewed labs from 06/21/2023: Normal LFTs and mildly elevated total bilirubin is normal.  CBC with mild thrombocytopenia and anemia stable.  CA 19-9 is normal. - CTAP on 06/21/2023: No evidence of recurrence or metastatic disease.  Other benign findings discussed with the patient. - As he has high chance of recurrence, recommend follow-up in 6 months with repeat CT scan and labs including tumor marker.       Orders Placed This Encounter  Procedures   CT ABDOMEN PELVIS W CONTRAST    Standing Status:   Future    Standing Expiration Date:   06/27/2024    Order Specific Question:   If indicated for the  ordered procedure, I authorize the administration of contrast media per Radiology protocol    Answer:   Yes    Order Specific Question:   Does the patient have a contrast media/X-ray dye allergy?    Answer:   No    Order Specific Question:   Preferred imaging location?    Answer:   [facility]    Order Specific Question:   If indicated for the ordered procedure, I authorize the administration of oral contrast media per Radiology protocol    Answer:   Yes   CBC with Differential    Standing Status:   Future    Standing Expiration Date:   06/27/2024   Comprehensive metabolic panel    Standing Status:   Future    Standing Expiration Date:   06/27/2024   Cancer antigen 19-9    Standing Status:   Future    Standing Expiration Date:   06/27/2024      Alben Deeds Teague,acting as a scribe for Doreatha Massed, MD.,have documented all relevant documentation on the behalf of Doreatha Massed, MD,as directed by  Doreatha Massed, MD while in the presence of Doreatha Massed, MD.  I, Doreatha Massed MD, have reviewed the above documentation for accuracy and completeness, and I agree with the above.    Doreatha Massed, MD   10/24/20246:22 PM  CHIEF COMPLAINT:   Diagnosis: pancreatic adenocarcinoma    Cancer Staging  No matching staging information was found for the patient.    Prior Therapy: 1. Distal pancreatectomy and splenectomy at [facility] on 11/20/2018. 2. Adjuvant FOLFIRINOX  x 12 cycles from 12/30/2018 to 06/04/2019.  Current Therapy:  Surveillance    HISTORY OF PRESENT ILLNESS:   Oncology History  Pancreatic adenocarcinoma (HCC)  08/20/2018 Imaging   CT abdomen/pelvis w/ contrast: IMPRESSION: Atrophy and ductal dilatation involving the pancreatic tail, with suspected small soft tissue mass in the pancreatic body which could represent pancreatic carcinoma. Abdomen MRI and MRCP without and with contrast is recommended for further evaluation.    No evidence of hepatobiliary disease.   08/30/2018 Imaging   MRI abdomen w/ contrast: IMPRESSION: 1. Although not definitive, there remains concern of a small hypoenhancing mass at the junction of the pancreatic body and tail associated with atrophy and ductal dilatation in the pancreatic tail. This remains concerning for pancreatic neoplasm. Postinflammatory stricture less likely. Besides a tiny cystic lesion in the pancreatic tail, there are no other signs of previous pancreatitis. Further evaluation with endoscopic ultrasound for possible biopsy strongly recommended. 2. No evidence of metastatic disease. 3. Cholelithiasis without evidence of cholecystitis or biliary dilatation.   09/03/2018 Initial Diagnosis   Pancreatic adenocarcinoma (HCC)   10/03/2018 Procedure   EUS: - 2.6cm irregularly shaped mass in the body of the pancreas that causing main pancreatic duct obstruction and dilation. The mass abuts the splenic vessels but no other significant vascular structures and it was sampled with trangastric EUS FNA. Preliminary cytology is + for malignancy, likely well-differentiated adenocarcinoma. It appears surgically resectable.   10/03/2018 Pathology Results   Accession: [id number]  FINE NEEDLE ASPIRATION, ENDOSCOPIC, PANCREAS BODY (SPECIMEN 1 OF 1 COLLECTED 10/03/18): ADENOCARCINOMA.   10/17/2018 Imaging   PET: IMPRESSION: 1. Tiny focus of hypermetabolism identified in the body of pancreas, adjacent to the abrupt cut off of the main pancreatic duct. No evidence for hypermetabolic metastatic disease in the neck, chest, abdomen, or pelvis. 2. Cholelithiasis. 3.  Aortic Atherosclerois (ICD10-170.0) 4. 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  The Hereditary Gene Panel offered by [facility] includes sequencing and/or deletion duplication testing of the following 47 genes: APC, ATM, AXIN2, BARD1, BMPR1A, BRCA1,  BRCA2, BRIP1, CDH1, CDK4, CDKN2A (p14ARF), CDKN2A (p16INK4a), CHEK2, CTNNA1, DICER1, EPCAM (Deletion/duplication testing only), GREM1 (promoter region deletion/duplication testing only), KIT, MEN1, MLH1, MSH2, MSH3, MSH6, MUTYH, NBN, NF1, NHTL1, PALB2, PDGFRA, PMS2, POLD1, POLE, PTEN, RAD50, RAD51C, RAD51D, SDHB, SDHC, SDHD, SMAD4, SMARCA4. STK11, TP53, TSC1, TSC2, and VHL.  The following genes were evaluated for sequence changes only: SDHA and HOXB13 c.251G>A variant only. The report date is November 12, 2018.  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 The change in variant classification was made as a result of re-review of the evidence in light of new variant interpretation guidelines and/or new information. The amended report date is March 11, 2020.    11/20/2018 Surgery   Distal subtotal pancreatectomy with splenectomy at [facility]   11/20/2018 Surgery   TUMOR    Tumor Site:    Pancreatic body     Histologic Type:    Ductal adenocarcinoma     Histologic Grade:    G1: Well differentiated     Tumor Size:    Greatest dimension in Centimeters (cm): 2.8 Centimeters (cm)      Additional Dimension in Centimeters (cm):    2.7 Centimeters (cm)      Additional Dimension in Centimeters (cm):    1.8 Centimeters (cm)    Tumor Extent:  Tumor Extension:    Tumor is confined to pancreas     Accessory Findings:          Treatment Effect:    No known presurgical therapy       Lymphovascular Invasion:    Not identified       Perineural Invasion:    Not identified   MARGINS    Margins:          Proximal Pancreatic Parenchymal Margin:    Uninvolved by invasive carcinoma and pancreatic high-grade intraepithelial neoplasia         Distance of Invasive Carcinoma from Margin:    0.6 Centimeters (cm)    :          Other Margin:    Splenic margin.         Margin Status:    Uninvolved by invasive carcinoma   LYMPH NODES  Number of Lymph  Nodes Involved:    2   Number of Lymph Nodes Examined:    16   PATHOLOGIC STAGE CLASSIFICATION  (pTNM, AJCC 8th Edition)  TNM Descriptors:    Not applicable   Primary Tumor (pT):    pT2   Regional Lymph Nodes (pN):    pN1   ADDITIONAL FINDINGS  Additional Pathologic Findings:    Pancreatic intraepithelial neoplasia     Highest Grade (PanIN):    High grade PanIN is present.   Additional Pathologic Findings:    Benign unilocular pancreatic cyst (1.3 cm in greatest dimension) at the pancreatic tail.   12/30/2018 - 06/06/2019 Chemotherapy   Patient is on Treatment Plan : PANCREAS FOLFIRINOX q14d        INTERVAL HISTORY:   Mark Foster is a 67 y.o. male presenting to clinic today for follow up of pancreatic adenocarcinoma. He was last seen by me on 12/26/22.  Since his last visit, he underwent surveillance CT A/P on 06/21/23 that found: no evidence of recurrent or metastatic disease in the abdomen or pelvis; unchanged fluid attenuation lesion of the tip of the uncinate measuring 1.2 x 1.1 cm, consistent with a small side branch IPMN; cholelithiasis; pancolonic diverticulosis without evidence of acute diverticulitis; and prostatomegaly. Of note, he has a colonoscopy scheduled on 07/23/23 with Dr. Marletta Lor.   He was admitted to the ED on 05/06/23 for sepsis secondary to pneumonia of left lower lobe. He was given IV cefepime, Zithromax, Vancomycin, and Granix for neutropenic fever.   Today, he states that he is doing well overall. His appetite level is at 100%. His energy level is at 75%. He is accompanied by his wife. He reports a normal appetite and denies any new abdominal pain.   PAST MEDICAL HISTORY:   Past Medical History: Past Medical History:  Diagnosis Date   Bilateral carpal tunnel syndrome 10/19/2020   CAD (coronary artery disease)    a. 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   Type 2 diabetes mellitus (HCC)     Surgical History: Past Surgical History:  Procedure Laterality Date   APPENDECTOMY  BUBBLE STUDY N/A 11/18/2019   Procedure: BUBBLE STUDY;  Surgeon: Laqueta Linden, MD;  Location: [facility];  Service: Cardiology;  Laterality: N/A;   CARDIAC SURGERY     COLON RESECTION  1995   For diverticulitis   CORONARY ARTERY BYPASS GRAFT N/A 01/26/2020   Procedure: CORONARY ARTERY BYPASS GRAFTING (CABG) TIMES FOUR USING LEFT INTERNAL MAMMARY VEIN AND RIGHT GREATER SAPHENOUS VEIN;  Surgeon: Loreli Slot, MD;  Location: [facility] OR;  Service: Open Heart Surgery;  Laterality: N/A;   ENDOVEIN HARVEST OF GREATER SAPHENOUS VEIN Right 01/26/2020   Procedure: Mack Guise Of Greater Saphenous Vein;  Surgeon: Loreli Slot, MD;  Location: [facility];  Service: Open Heart Surgery;  Laterality: Right;   ESOPHAGOGASTRODUODENOSCOPY N/A 10/03/2018   Procedure: ESOPHAGOGASTRODUODENOSCOPY (EGD);  Surgeon: Rachael Fee, MD;  Location: [facility];  Service: Endoscopy;  Laterality: N/A;   EUS N/A 10/03/2018   Procedure: UPPER ENDOSCOPIC ULTRASOUND (EUS) RADIAL;  Surgeon: Rachael Fee, MD;  Location: [facility];  Service: Endoscopy;  Laterality: N/A;   EYE SURGERY Bilateral    FINE NEEDLE ASPIRATION N/A 10/03/2018   Procedure: FINE NEEDLE ASPIRATION (FNA) LINEAR;  Surgeon: Rachael Fee, MD;  Location: [facility];  Service: Endoscopy;  Laterality: N/A;   IR ANGIO INTRA EXTRACRAN SEL COM CAROTID INNOMINATE BILAT MOD SED  01/21/2018   IR ANGIO INTRA EXTRACRAN SEL COM CAROTID INNOMINATE UNI R MOD SED  03/21/2018   IR ANGIO VERTEBRAL SEL VERTEBRAL BILAT MOD SED  01/21/2018    IR TRANSCATH EXCRAN VERT OR CAR A STENT  03/21/2018   LEFT HEART CATH AND CORONARY ANGIOGRAPHY N/A 01/08/2020   Procedure: LEFT HEART CATH AND CORONARY ANGIOGRAPHY;  Surgeon: Marykay Lex, MD;  Location: [facility] INVASIVE CV LAB;  Service: Cardiovascular;  Laterality: N/A;   PORT-A-CATH REMOVAL N/A 06/20/2021   Procedure: MINOR REMOVAL PORT-A-CATH;  Surgeon: Lucretia Roers, MD;  Location: [facility];  Service: General;  Laterality: N/A;   PORTACATH PLACEMENT Left 12/23/2018   Procedure: INSERTION PORT-A-CATH;  Surgeon: Lucretia Roers, MD;  Location: [facility];  Service: General;  Laterality: Left;   RADIOLOGY WITH ANESTHESIA N/A 03/21/2018   Procedure: IR WITH ANESTHESIA WITH STENT PLACEMENT;  Surgeon: Julieanne Cotton, MD;  Location: [facility] OR;  Service: Radiology;  Laterality: N/A;   ROTATOR CUFF REPAIR     Left   SPLENECTOMY     TEE WITHOUT CARDIOVERSION N/A 11/18/2019   Procedure: TRANSESOPHAGEAL ECHOCARDIOGRAM (TEE) WITH PROPOFOL;  Surgeon: Laqueta Linden, MD;  Location: [facility];  Service: Cardiology;  Laterality: N/A;   TEE WITHOUT CARDIOVERSION N/A 01/26/2020   Procedure: TRANSESOPHAGEAL ECHOCARDIOGRAM (TEE);  Surgeon: Loreli Slot, MD;  Location: [facility] OR;  Service: Open Heart Surgery;  Laterality: N/A;   TONSILLECTOMY      Social History: Social History   Socioeconomic History   Marital status: Married    Spouse name: Not on file   Number of children: 2   Years of education: Not on file   Highest education level: Not on file  Occupational History   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  Tobacco Use   Smoking status: Former    Current packs/day: 0.25    Average packs/day: 0.3 packs/day for 3.7 years (0.9 ttl pk-yrs)    Types: Cigarettes    Start date: 10/06/2019   Smokeless tobacco: Never  Vaping Use   Vaping status: Never Used  Substance and Sexual Activity   Alcohol use: Not Currently    Alcohol/week: 1.0 standard  drink of alcohol    Types: 1 Cans of beer per week    Drug use: Never   Sexual activity: Not on file  Other Topics Concern   Not on file  Social History Narrative   Caffiene decaff (2-3 cups daily),  soda (dependent on blood glucose)   Married,  3 children - 4 grandchildren.      Social Determinants of Health   Financial Resource Strain: Medium Risk (12/18/2018)   Overall Financial Resource Strain (CARDIA)    Difficulty of Paying Living Expenses: Somewhat hard  Food Insecurity: No Food Insecurity (05/06/2023)   Hunger Vital Sign    Worried About Running Out of Food in the Last Year: Never true    Ran Out of Food in the Last Year: Never true  Transportation Needs: No Transportation Needs (05/06/2023)   PRAPARE - Administrator, Civil Service (Medical): No    Lack of Transportation (Non-Medical): No  Physical Activity: Inactive (12/18/2018)   Exercise Vital Sign    Days of Exercise per Week: 0 days    Minutes of Exercise per Session: 0 min  Stress: Stress Concern Present (12/18/2018)   [employer] of Occupational Health - Occupational Stress Questionnaire    Feeling of Stress : To some extent  Social Connections: Somewhat Isolated (12/18/2018)   Social Connection and Isolation Panel [NHANES]    Frequency of Communication with Friends and Family: More than three times a week    Frequency of Social Gatherings with Friends and Family: Once a week    Attends Religious Services: More than 4 times per year    Active Member of [employer] or Organizations: No    Attends Banker Meetings: Never    Marital Status: Divorced  Catering manager Violence: Not At Risk (05/06/2023)   Humiliation, Afraid, Rape, and Kick questionnaire    Fear of Current or Ex-Partner: No    Emotionally Abused: No    Physically Abused: No    Sexually Abused: No    Family History: Family History  Problem Relation Age of Onset   Stroke Mother    Pancreatic cancer Father 2       d. 36   Stroke Maternal Grandmother    Heart attack Maternal  Grandfather    Heart Problems Paternal Grandfather    Scoliosis Daughter    Muscular dystrophy Grandson    Colon cancer Neg Hx     Current Medications:  Current Outpatient Medications:    albuterol (VENTOLIN HFA) 108 (90 Base) MCG/ACT inhaler, Inhale into the lungs., Disp: , Rfl:    aspirin EC 81 MG tablet, Take 1 tablet (81 mg total) by mouth daily with breakfast., Disp: 90 tablet, Rfl: 3   atorvastatin (LIPITOR) 80 MG tablet, Take 1 tablet (80 mg total) by mouth daily at 6 PM. (Patient taking differently: Take 80 mg by mouth daily.), Disp: 90 tablet, Rfl: 1   Cholecalciferol (VITAMIN D3) 25 MCG (1000 UT) capsule, Take 1,000 Units by mouth daily., Disp: , Rfl:    CINNAMON PO, Take by mouth., Disp: , Rfl:    cyanocobalamin (VITAMIN B12) 1000 MCG tablet, Take 1 tablet (1,000 mcg total) by mouth daily., Disp: 90 tablet, Rfl: 3   ECHINACEA COMPLEX PO, Take 1,000 mg by mouth daily., Disp: , Rfl:    ezetimibe (ZETIA) 10 MG tablet, Take by mouth., Disp: , Rfl:    gabapentin (NEURONTIN) 300 MG capsule, Take 1 capsule (300 mg total) by mouth 3 (three) times daily.,  Disp: 270 capsule, Rfl: 3   hydrocortisone (ANUSOL-HC) 2.5 % rectal cream, Place 1 Application rectally 2 (two) times daily. For 14 days, Disp: 30 g, Rfl: 1   insulin aspart (NOVOLOG FLEXPEN) 100 UNIT/ML FlexPen, Inject 14-20 Units into the skin 3 (three) times daily before meals. (Patient taking differently: Inject 18-24 Units into the skin 3 (three) times daily before meals.), Disp: 30 mL, Rfl: 1   Insulin Pen Needle 32G X 4 MM MISC, 1 Device by Does not apply route daily. Use as directed to inject insulin four times daily, Disp: 200 each, Rfl: 2   levothyroxine (SYNTHROID) 50 MCG tablet, TAKE 1 TABLET EVERY DAY, Disp: 90 tablet, Rfl: 1   linaclotide (LINZESS) 290 MCG CAPS capsule, Take 1 capsule (290 mcg total) by mouth daily before breakfast., Disp: 30 capsule, Rfl: 3   meloxicam (MOBIC) 15 MG tablet, Take 15 mg by mouth daily., Disp:  , Rfl:    metoprolol succinate (TOPROL-XL) 25 MG 24 hr tablet, Take 25 mg by mouth daily., Disp: , Rfl:    Multiple Vitamin (MULTIVITAMIN WITH MINERALS) TABS tablet, Take 1 tablet by mouth daily., Disp: 100 tablet, Rfl: 2   nitroGLYCERIN (NITROSTAT) 0.4 MG SL tablet, Place 1 tablet (0.4 mg total) under the tongue every 5 (five) minutes as needed for chest pain., Disp: 25 tablet, Rfl: 3   Omega-3 1000 MG CAPS, Take 1,000 mg by mouth daily., Disp: , Rfl:    tamsulosin (FLOMAX) 0.4 MG CAPS capsule, Take 1 capsule (0.4 mg total) by mouth daily after supper., Disp: 90 capsule, Rfl: 3   TOUJEO MAX SOLOSTAR 300 UNIT/ML Solostar Pen, INJECT 60 UNITS INTO THE SKIN AT BEDTIME., Disp: 6 mL, Rfl: 0   traMADol (ULTRAM) 50 MG tablet, Take 50 mg by mouth 2 (two) times daily as needed., Disp: , Rfl:    zinc sulfate 220 (50 Zn) MG capsule, Take 1 capsule (220 mg total) by mouth daily., Disp: 90 capsule, Rfl: 3 No current facility-administered medications for this visit.  Facility-Administered Medications Ordered in Other Visits:    0.9 %  sodium chloride infusion, , Intravenous, Continuous, Doreatha Massed, MD, Last Rate: 20 mL/hr at 12/30/18 1351, New Bag at 12/30/18 1351   0.9 %  sodium chloride infusion, , Intravenous, Continuous, Doreatha Massed, MD, Last Rate: 20 mL/hr at 12/30/18 1401, New Bag at 12/30/18 1401   Allergies: Allergies  Allergen Reactions   Demerol [Meperidine Hcl] Other (See Comments)    convulsions    REVIEW OF SYSTEMS:   Review of Systems  Constitutional:  Positive for fatigue. Negative for chills and fever.  HENT:   Negative for lump/mass, mouth sores, nosebleeds, sore throat and trouble swallowing.   Eyes:  Negative for eye problems.  Respiratory:  Positive for cough and shortness of breath.   Cardiovascular:  Negative for chest pain, leg swelling and palpitations.  Gastrointestinal:  Negative for abdominal pain, constipation, diarrhea, nausea and vomiting.   Genitourinary:  Negative for bladder incontinence, difficulty urinating, dysuria, frequency, hematuria and nocturia.   Musculoskeletal:  Negative for arthralgias, back pain, flank pain, myalgias and neck pain.  Skin:  Negative for itching and rash.  Neurological:  Negative for dizziness, headaches and numbness.  Hematological:  Does not bruise/bleed easily.  Psychiatric/Behavioral:  Negative for depression, sleep disturbance and suicidal ideas. The patient is not nervous/anxious.   All other systems reviewed and are negative.    VITALS:   There were no vitals taken for this visit.  Wt Readings  from Last 3 Encounters:  05/28/23 211 lb 6.4 oz (95.9 kg)  05/15/23 213 lb 10 oz (96.9 kg)  04/25/23 220 lb (99.8 kg)    There is no height or weight on file to calculate BMI.  Performance status (ECOG): 1 - Symptomatic but completely ambulatory  PHYSICAL EXAM:   Physical Exam Vitals and nursing note reviewed. Exam conducted with a chaperone present.  Constitutional:      Appearance: Normal appearance.  Cardiovascular:     Rate and Rhythm: Normal rate and regular rhythm.     Pulses: Normal pulses.     Heart sounds: Normal heart sounds.  Pulmonary:     Effort: Pulmonary effort is normal.     Breath sounds: Normal breath sounds.  Abdominal:     Palpations: Abdomen is soft. There is no hepatomegaly, splenomegaly or mass.     Tenderness: There is no abdominal tenderness.  Musculoskeletal:     Right lower leg: No edema.     Left lower leg: No edema.  Lymphadenopathy:     Cervical: No cervical adenopathy.     Right cervical: No superficial, deep or posterior cervical adenopathy.    Left cervical: No superficial, deep or posterior cervical adenopathy.     Upper Body:     Right upper body: No supraclavicular or axillary adenopathy.     Left upper body: No supraclavicular or axillary adenopathy.  Neurological:     General: No focal deficit present.     Mental Status: He is alert and  oriented to person, place, and time.  Psychiatric:        Mood and Affect: Mood normal.        Behavior: Behavior normal.     LABS:      Latest Ref Rng & Units 06/21/2023   12:53 PM 05/15/2023    4:03 AM 05/14/2023    4:12 AM  CBC  WBC 4.0 - 10.5 K/uL 4.5  2.5  2.5   Hemoglobin 13.0 - 17.0 g/dL 64.3  32.9  51.8   Hematocrit 39.0 - 52.0 % 34.3  27.8  33.6   Platelets 150 - 400 K/uL 108  76  56       Latest Ref Rng & Units 06/21/2023   12:53 PM 05/15/2023    4:03 AM 05/14/2023    4:12 AM  CMP  Glucose 70 - 99 mg/dL 841  660  630   BUN 8 - 23 mg/dL 17  16  14    Creatinine 0.61 - 1.24 mg/dL 1.60  1.09  3.23   Sodium 135 - 145 mmol/L 133  130  131   Potassium 3.5 - 5.1 mmol/L 4.3  3.8  4.2   Chloride 98 - 111 mmol/L 101  101  98   CO2 22 - 32 mmol/L 24  23  22    Calcium 8.9 - 10.3 mg/dL 9.2  7.8  8.1   Total Protein 6.5 - 8.1 g/dL 6.8  6.0  6.0   Total Bilirubin 0.3 - 1.2 mg/dL 1.6  1.3  1.4   Alkaline Phos 38 - 126 U/L 53  66  75   AST 15 - 41 U/L 22  63  89   ALT 0 - 44 U/L 23  137  148      No results found for: "CEA1", "CEA" / No results found for: "CEA1", "CEA" No results found for: "PSA1" Lab Results  Component Value Date   FTD322 13 06/21/2023   No results found  for: "ZOX096"  Lab Results  Component Value Date   TOTALPROTELP 6.6 06/21/2018   ALBUMINELP 3.9 06/21/2018   A1GS 0.3 06/21/2018   A2GS 0.6 06/21/2018   BETS 0.8 06/21/2018   GAMS 0.9 06/21/2018   MSPIKE Not Observed 06/21/2018   SPEI Comment 06/21/2018   Lab Results  Component Value Date   TIBC 264 07/04/2018   FERRITIN 325 10/31/2022   FERRITIN 263 10/30/2022   FERRITIN 191 07/04/2018   IRONPCTSAT 25 07/04/2018   Lab Results  Component Value Date   LDH 157 06/21/2018     STUDIES:   CT Abdomen Pelvis W Contrast  Result Date: 06/27/2023 CLINICAL DATA:  Pancreatic cancer restaging, status post distal pancreatectomy and splenectomy * Tracking Code: BO * EXAM: CT ABDOMEN AND PELVIS WITH  CONTRAST TECHNIQUE: Multidetector CT imaging of the abdomen and pelvis was performed using the standard protocol following bolus administration of intravenous contrast. RADIATION DOSE REDUCTION: This exam was performed according to the departmental dose-optimization program which includes automated exposure control, adjustment of the mA and/or kV according to patient size and/or use of iterative reconstruction technique. CONTRAST:  OMNIPAQUE IOHEXOL 300 MG/ML  SOLN COMPARISON:  CT abdomen pelvis, 05/28/2023, MR abdomen, 08/30/2018 FINDINGS: Lower chest: No acute abnormality. Hepatobiliary: No solid liver abnormality is seen. Small gallstone. Gallbladder wall thickening, or biliary dilatation. Pancreas: Unchanged postoperative appearance of the pancreas status post distal pancreatectomy of the pancreatic neck. No pancreatic ductal dilatation or surrounding inflammatory changes. Unchanged fluid attenuation lesion of the tip of the uncinate measuring 1.2 x 1.1 cm (series 2, image 30). Spleen: Status post splenectomy. Adrenals/Urinary Tract: Adrenal glands are unremarkable. Kidneys are normal, without renal calculi, solid lesion, or hydronephrosis. Bladder is unremarkable. Stomach/Bowel: Stomach is within normal limits. Appendix not clearly visualized. Pancolonic diverticulosis, particularly notable in the transverse and descending colon. No evidence of bowel wall thickening, distention, or inflammatory changes. Vascular/Lymphatic: Aortic atherosclerosis. No enlarged abdominal or pelvic lymph nodes. Reproductive: Prostatomegaly. Other: Broad-based, multi compartment midline epigastric hernias containing only fat (series 2, image 28). No ascites. Musculoskeletal: No acute or significant osseous findings. IMPRESSION: 1. 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 2. No evidence of recurrent or metastatic disease in the abdomen or pelvis. 3. Unchanged fluid  attenuation lesion of the tip of the uncinate measuring 1.2 x 1.1 cm, consistent with a small side branch IPMN. As previously reported, this is not significantly changed on numerous prior examinations dating back to at least 08/30/2018, and can be considered definitively benign, requiring no specific further follow-up or characterization. 4. Cholelithiasis. 5. Pancolonic diverticulosis without evidence of acute diverticulitis. 6. Prostatomegaly. Aortic Atherosclerosis (ICD10-I70.0). Electronically Signed   By: Jearld Lesch M.D.   On: 06/27/2023 16:24

## 2023-06-28 NOTE — Patient Instructions (Signed)
[facility] Discharge Instructions   You were seen and examined today by Dr. Ellin Saba.  He reviewed the results of your lab work which are normal/stable.   He reviewed the results of your CT scan which is normal. There is no evidence of cancer on this exam.   We will see you back in    Thank you for choosing [facility] to provide your oncology and hematology care.  To afford each patient quality time with our provider, please arrive at least 15 minutes before your scheduled appointment time.   If you have a lab appointment with the Cancer Center please come in thru the Main Entrance and check in at the main information desk.  You need to re-schedule your appointment should you arrive 10 or more minutes late.  We strive to give you quality time with our providers, and arriving late affects you and other patients whose appointments are after yours.  Also, if you no show three or more times for appointments you may be dismissed from the clinic at the providers discretion.     Again, thank you for choosing [facility].  Our hope is that these requests will decrease the amount of time that you wait before being seen by our physicians.       _____________________________________________________________  Should you have questions after your visit to [facility], please contact our office at [phone] and follow the prompts.  Our office hours are 8:00 a.m. and 4:30 p.m. Monday - Friday.  Please note that voicemails left after 4:00 p.m. may not be returned until the following business day.  We are closed weekends and major holidays.  You do have access to a nurse 24-7, just call the main number to the clinic [phone] and do not press any options, hold on the line and a nurse will answer the phone.    For prescription refill requests, have your pharmacy contact our office and allow 72 hours.     Due to Covid, you will need to wear a mask upon entering the hospital. If you do not have a mask, a mask will be given to you at the Main Entrance upon arrival. For doctor visits, patients may have 1 support person age 48 or older with them. For treatment visits, patients can not have anyone with them due to social distancing guidelines and our immunocompromised population.

## 2023-07-18 DIAGNOSIS — K432 Incisional hernia without obstruction or gangrene: Secondary | ICD-10-CM | POA: Diagnosis not present

## 2023-07-19 ENCOUNTER — Encounter (HOSPITAL_COMMUNITY): Payer: Self-pay

## 2023-07-19 ENCOUNTER — Other Ambulatory Visit: Payer: Self-pay

## 2023-07-19 ENCOUNTER — Encounter (HOSPITAL_COMMUNITY)
Admission: RE | Admit: 2023-07-19 | Discharge: 2023-07-19 | Disposition: A | Discharge status: Home / Self Care | Payer: PPO | Source: Ambulatory Visit | Attending: Internal Medicine | Admitting: Internal Medicine

## 2023-07-23 ENCOUNTER — Ambulatory Visit (HOSPITAL_BASED_OUTPATIENT_CLINIC_OR_DEPARTMENT_OTHER): Payer: PPO | Admitting: Certified Registered"

## 2023-07-23 ENCOUNTER — Encounter (HOSPITAL_COMMUNITY): Payer: Self-pay

## 2023-07-23 ENCOUNTER — Ambulatory Visit (HOSPITAL_COMMUNITY): Payer: PPO | Admitting: Certified Registered"

## 2023-07-23 ENCOUNTER — Encounter (HOSPITAL_COMMUNITY)
Admission: RE | Disposition: A | Discharge status: Home / Self Care | Payer: Self-pay | Source: Home / Self Care | Attending: Internal Medicine

## 2023-07-23 ENCOUNTER — Ambulatory Visit (HOSPITAL_COMMUNITY)
Admission: RE | Admit: 2023-07-23 | Discharge: 2023-07-23 | Disposition: A | Discharge status: Home / Self Care | Payer: PPO | Attending: Internal Medicine | Admitting: Internal Medicine

## 2023-07-23 DIAGNOSIS — Z79890 Hormone replacement therapy (postmenopausal): Secondary | ICD-10-CM | POA: Diagnosis not present

## 2023-07-23 DIAGNOSIS — Z791 Long term (current) use of non-steroidal anti-inflammatories (NSAID): Secondary | ICD-10-CM | POA: Insufficient documentation

## 2023-07-23 DIAGNOSIS — Z951 Presence of aortocoronary bypass graft: Secondary | ICD-10-CM | POA: Diagnosis not present

## 2023-07-23 DIAGNOSIS — Z79899 Other long term (current) drug therapy: Secondary | ICD-10-CM | POA: Insufficient documentation

## 2023-07-23 DIAGNOSIS — D649 Anemia, unspecified: Secondary | ICD-10-CM | POA: Diagnosis not present

## 2023-07-23 DIAGNOSIS — K5730 Diverticulosis of large intestine without perforation or abscess without bleeding: Secondary | ICD-10-CM | POA: Insufficient documentation

## 2023-07-23 DIAGNOSIS — Z8507 Personal history of malignant neoplasm of pancreas: Secondary | ICD-10-CM | POA: Insufficient documentation

## 2023-07-23 DIAGNOSIS — Z87891 Personal history of nicotine dependence: Secondary | ICD-10-CM | POA: Insufficient documentation

## 2023-07-23 DIAGNOSIS — K648 Other hemorrhoids: Secondary | ICD-10-CM | POA: Diagnosis not present

## 2023-07-23 DIAGNOSIS — D123 Benign neoplasm of transverse colon: Secondary | ICD-10-CM | POA: Insufficient documentation

## 2023-07-23 DIAGNOSIS — E1140 Type 2 diabetes mellitus with diabetic neuropathy, unspecified: Secondary | ICD-10-CM | POA: Diagnosis not present

## 2023-07-23 DIAGNOSIS — D120 Benign neoplasm of cecum: Secondary | ICD-10-CM | POA: Insufficient documentation

## 2023-07-23 DIAGNOSIS — I25119 Atherosclerotic heart disease of native coronary artery with unspecified angina pectoris: Secondary | ICD-10-CM | POA: Diagnosis not present

## 2023-07-23 DIAGNOSIS — Z1211 Encounter for screening for malignant neoplasm of colon: Secondary | ICD-10-CM

## 2023-07-23 DIAGNOSIS — Z800 Family history of malignant neoplasm of digestive organs: Secondary | ICD-10-CM | POA: Diagnosis not present

## 2023-07-23 DIAGNOSIS — Z139 Encounter for screening, unspecified: Secondary | ICD-10-CM | POA: Diagnosis not present

## 2023-07-23 DIAGNOSIS — E039 Hypothyroidism, unspecified: Secondary | ICD-10-CM | POA: Diagnosis not present

## 2023-07-23 DIAGNOSIS — Z794 Long term (current) use of insulin: Secondary | ICD-10-CM | POA: Diagnosis not present

## 2023-07-23 DIAGNOSIS — Z980 Intestinal bypass and anastomosis status: Secondary | ICD-10-CM | POA: Diagnosis not present

## 2023-07-23 DIAGNOSIS — Z7982 Long term (current) use of aspirin: Secondary | ICD-10-CM | POA: Insufficient documentation

## 2023-07-23 DIAGNOSIS — D126 Benign neoplasm of colon, unspecified: Secondary | ICD-10-CM | POA: Diagnosis not present

## 2023-07-23 DIAGNOSIS — K635 Polyp of colon: Secondary | ICD-10-CM | POA: Diagnosis not present

## 2023-07-23 DIAGNOSIS — I10 Essential (primary) hypertension: Secondary | ICD-10-CM | POA: Insufficient documentation

## 2023-07-23 HISTORY — PX: POLYPECTOMY: SHX5525

## 2023-07-23 HISTORY — PX: COLONOSCOPY WITH PROPOFOL: SHX5780

## 2023-07-23 LAB — GLUCOSE, CAPILLARY: Glucose-Capillary: 236 mg/dL — ABNORMAL HIGH (ref 70–99)

## 2023-07-23 SURGERY — COLONOSCOPY WITH PROPOFOL
Anesthesia: General

## 2023-07-23 MED ORDER — LACTATED RINGERS IV SOLN: INTRAVENOUS | Status: DC | PRN

## 2023-07-23 MED ORDER — PHENYLEPHRINE 80 MCG/ML (10ML) SYRINGE FOR IV PUSH (FOR BLOOD PRESSURE SUPPORT)
PREFILLED_SYRINGE | INTRAVENOUS | Status: AC
  Filled 2023-07-23: qty 10

## 2023-07-23 MED ORDER — PHENYLEPHRINE 80 MCG/ML (10ML) SYRINGE FOR IV PUSH (FOR BLOOD PRESSURE SUPPORT)
PREFILLED_SYRINGE | INTRAVENOUS | Status: DC | PRN
  Administered 2023-07-23 (×3): 160 ug via INTRAVENOUS

## 2023-07-23 MED ORDER — STERILE WATER FOR IRRIGATION IR SOLN
Status: DC | PRN
  Administered 2023-07-23: 120 mL

## 2023-07-23 MED ORDER — LIDOCAINE HCL (PF) 2 % IJ SOLN
INTRAMUSCULAR | Status: DC | PRN
  Administered 2023-07-23: 50 mg via INTRADERMAL

## 2023-07-23 MED ORDER — PROPOFOL 10 MG/ML IV BOLUS
INTRAVENOUS | Status: DC | PRN
  Administered 2023-07-23: 100 mg via INTRAVENOUS
  Administered 2023-07-23 (×2): 40 mg via INTRAVENOUS

## 2023-07-23 NOTE — Anesthesia Preprocedure Evaluation (Signed)
Anesthesia Evaluation  Patient identified by MRN, date of birth, ID band Patient awake    Reviewed: Allergy & Precautions, H&P , NPO status , Patient's Chart, lab work & pertinent test results, reviewed documented beta blocker date and time   Airway Mallampati: II  TM Distance: >3 FB Neck ROM: full    Dental no notable dental hx.    Pulmonary neg pulmonary ROS, pneumonia, former smoker   Pulmonary exam normal breath sounds clear to auscultation       Cardiovascular Exercise Tolerance: Good hypertension, + angina  + CAD and + CABG   Rhythm:regular Rate:Normal     Neuro/Psych  Neuromuscular disease CVA negative neurological ROS  negative psych ROS   GI/Hepatic negative GI ROS, Neg liver ROS,,,  Endo/Other  negative endocrine ROSdiabetesHypothyroidism    Renal/GU negative Renal ROS  negative genitourinary   Musculoskeletal   Abdominal   Peds  Hematology negative hematology ROS (+) Blood dyscrasia, anemia   Anesthesia Other Findings   Reproductive/Obstetrics negative OB ROS                             Anesthesia Physical Anesthesia Plan  ASA: 3  Anesthesia Plan: General   Post-op Pain Management:    Induction:   PONV Risk Score and Plan: Propofol infusion  Airway Management Planned:   Additional Equipment:   Intra-op Plan:   Post-operative Plan:   Informed Consent: I have reviewed the patients History and Physical, chart, labs and discussed the procedure including the risks, benefits and alternatives for the proposed anesthesia with the patient or authorized representative who has indicated his/her understanding and acceptance.     Dental Advisory Given  Plan Discussed with: CRNA  Anesthesia Plan Comments:        Anesthesia Quick Evaluation

## 2023-07-23 NOTE — Anesthesia Postprocedure Evaluation (Signed)
Anesthesia Post Note  Patient: Mark Foster  Procedure(s) Performed: COLONOSCOPY WITH PROPOFOL POLYPECTOMY  Patient location during evaluation: Phase II Anesthesia Type: General Level of consciousness: awake Pain management: pain level controlled Vital Signs Assessment: post-procedure vital signs reviewed and stable Respiratory status: spontaneous breathing and respiratory function stable Cardiovascular status: blood pressure returned to baseline and stable Postop Assessment: no headache and no apparent nausea or vomiting Anesthetic complications: no Comments: Late entry   No notable events documented.   Last Vitals:  Vitals:   07/23/23 0815 07/23/23 0949  BP: 126/73 (!) 110/43  Pulse: 80 64  Resp: 17 13  Temp:  (!) 36.3 C  SpO2: 100% 100%    Last Pain:  Vitals:   07/23/23 0949  TempSrc: Axillary  PainSc: Asleep                 Windell Norfolk

## 2023-07-23 NOTE — H&P (Signed)
Primary Care Physician:  Benita Stabile, MD Primary Gastroenterologist:  Dr. Marletta Lor  Pre-Procedure History & Physical: HPI:  Mark Foster is a 67 y.o. male is here for a colonoscopy for colon cancer screening purposes.   Past Medical History:  Diagnosis Date   Bilateral carpal tunnel syndrome 10/19/2020   CAD (coronary artery disease)    a. 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   Type 2 diabetes mellitus (HCC)     Past Surgical History:  Procedure Laterality Date   APPENDECTOMY     BUBBLE STUDY N/A 11/18/2019   Procedure: BUBBLE STUDY;  Surgeon: Laqueta Linden, MD;  Location: [facility];  Service: Cardiology;  Laterality: N/A;   CARDIAC SURGERY     COLON RESECTION  1995   For diverticulitis   CORONARY ARTERY BYPASS GRAFT N/A 01/26/2020   Procedure: CORONARY ARTERY BYPASS GRAFTING (CABG) TIMES FOUR USING LEFT INTERNAL MAMMARY VEIN AND RIGHT GREATER SAPHENOUS VEIN;  Surgeon: Loreli Slot, MD;  Location: [facility] OR;  Service: Open Heart Surgery;  Laterality: N/A;   ENDOVEIN HARVEST OF GREATER SAPHENOUS VEIN Right 01/26/2020   Procedure: Mack Guise Of Greater Saphenous Vein;  Surgeon: Loreli Slot, MD;  Location: [facility] OR;  Service: Open Heart Surgery;  Laterality: Right;   ESOPHAGOGASTRODUODENOSCOPY N/A 10/03/2018   Procedure: ESOPHAGOGASTRODUODENOSCOPY (EGD);  Surgeon: Rachael Fee, MD;  Location: [facility];  Service: Endoscopy;  Laterality: N/A;   EUS N/A 10/03/2018   Procedure: UPPER ENDOSCOPIC ULTRASOUND (EUS) RADIAL;  Surgeon: Rachael Fee, MD;  Location: [facility]  ENDOSCOPY;  Service: Endoscopy;  Laterality: N/A;   EYE SURGERY Bilateral    FINE NEEDLE ASPIRATION N/A 10/03/2018   Procedure: FINE NEEDLE ASPIRATION (FNA) LINEAR;  Surgeon: Rachael Fee, MD;  Location: [facility] ENDOSCOPY;  Service: Endoscopy;  Laterality: N/A;   IR ANGIO INTRA EXTRACRAN SEL COM CAROTID INNOMINATE BILAT MOD SED  01/21/2018   IR ANGIO INTRA EXTRACRAN SEL COM CAROTID INNOMINATE UNI R MOD SED  03/21/2018   IR ANGIO VERTEBRAL SEL VERTEBRAL BILAT MOD SED  01/21/2018   IR TRANSCATH EXCRAN VERT OR CAR A STENT  03/21/2018   LEFT HEART CATH AND CORONARY ANGIOGRAPHY N/A 01/08/2020   Procedure: LEFT HEART CATH AND CORONARY ANGIOGRAPHY;  Surgeon: Marykay Lex, MD;  Location: [facility] INVASIVE CV LAB;  Service: Cardiovascular;  Laterality: N/A;   PORT-A-CATH REMOVAL N/A 06/20/2021   Procedure: MINOR REMOVAL PORT-A-CATH;  Surgeon: Lucretia Roers, MD;  Location: [facility];  Service: General;  Laterality: N/A;   PORTACATH PLACEMENT Left 12/23/2018   Procedure: INSERTION PORT-A-CATH;  Surgeon: Lucretia Roers, MD;  Location: [facility];  Service: General;  Laterality: Left;   RADIOLOGY WITH ANESTHESIA N/A 03/21/2018   Procedure: IR WITH ANESTHESIA WITH STENT PLACEMENT;  Surgeon: Julieanne Cotton, MD;  Location: [facility] OR;  Service: Radiology;  Laterality: N/A;   ROTATOR CUFF REPAIR     Left   SPLENECTOMY     TEE WITHOUT CARDIOVERSION N/A 11/18/2019   Procedure: TRANSESOPHAGEAL ECHOCARDIOGRAM (TEE) WITH PROPOFOL;  Surgeon: Laqueta Linden, MD;  Location: [facility];  Service: Cardiology;  Laterality: N/A;   TEE WITHOUT CARDIOVERSION N/A 01/26/2020   Procedure: TRANSESOPHAGEAL ECHOCARDIOGRAM (TEE);  Surgeon: Loreli Slot, MD;  Location: [facility] OR;  Service: Open Heart Surgery;  Laterality: N/A;   TONSILLECTOMY      Prior to Admission medications   Medication Sig Start Date End Date Taking? Authorizing Provider  albuterol (VENTOLIN HFA) 108 (90 Base) MCG/ACT inhaler Inhale into the lungs.  11/09/22  Yes [provider]  aspirin EC 81 MG tablet Take 1 tablet (81 mg total) by mouth daily with breakfast. 10/31/22  Yes Emokpae, Courage, MD  atorvastatin (LIPITOR) 80 MG tablet Take 1 tablet (80 mg total) by mouth daily at 6 PM. Patient taking differently: Take 80 mg by mouth daily. 05/03/20  Yes Gosrani, Nimish C, MD  ezetimibe (ZETIA) 10 MG tablet Take by mouth. 10/18/21  Yes [provider]  gabapentin (NEURONTIN) 300 MG capsule Take 1 capsule (300 mg total) by mouth 3 (three) times daily. 04/25/23  Yes McCue, Shanda Bumps, NP  levothyroxine (SYNTHROID) 50 MCG tablet TAKE 1 TABLET EVERY DAY 08/14/20  Yes Wilson Singer, MD  linaclotide (LINZESS) 290 MCG CAPS capsule Take 1 capsule (290 mcg total) by mouth daily before breakfast. 05/28/23  Yes Tiffany Kocher, PA-C  meloxicam (MOBIC) 15 MG tablet Take 15 mg by mouth daily. 03/13/23  Yes [provider]  metoprolol succinate (TOPROL-XL) 25 MG 24 hr tablet Take 25 mg by mouth daily. 08/11/22  Yes [provider]  tamsulosin (FLOMAX) 0.4 MG CAPS capsule Take 1 capsule (0.4 mg total) by mouth daily after supper. 10/31/22  Yes Emokpae, Courage, MD  traMADol (ULTRAM) 50 MG tablet Take 50 mg by mouth 2 (two) times daily as needed. 05/31/23  Yes [provider]  Cholecalciferol (VITAMIN D3) 25 MCG (1000 UT) capsule Take 1,000 Units by mouth daily.    [provider]  CINNAMON PO Take by mouth.    [provider]  cyanocobalamin (VITAMIN B12) 1000 MCG tablet Take 1 tablet (1,000 mcg total) by mouth daily. 10/31/22   Shon Hale, MD  [facility] COMPLEX PO Take 1,000 mg by mouth daily.    [provider]  hydrocortisone (ANUSOL-HC) 2.5 % rectal cream Place 1 Application rectally 2 (two) times daily. For 14 days 05/28/23   Tiffany Kocher, PA-C  insulin aspart (NOVOLOG FLEXPEN) 100 UNIT/ML FlexPen Inject 14-20 Units into the skin 3 (three) times daily before meals. Patient taking  differently: Inject 18-24 Units into the skin 3 (three) times daily before meals. 05/12/21   Roma Kayser, MD  Insulin Pen Needle 32G X 4 MM MISC 1 Device by Does not apply route daily. Use as directed to inject insulin four times daily 10/28/20   Roma Kayser, MD  Multiple Vitamin (MULTIVITAMIN WITH MINERALS) TABS tablet Take 1 tablet by mouth daily. 11/01/22   Shon Hale, MD  nitroGLYCERIN (NITROSTAT) 0.4 MG SL tablet Place 1 tablet (0.4 mg total) under the tongue every 5 (five) minutes as needed for chest pain. 10/31/21   Jonelle Sidle, MD  Omega-3 1000 MG CAPS Take 1,000 mg by mouth daily.    [provider]  TOUJEO MAX SOLOSTAR 300 UNIT/ML Solostar Pen INJECT 60 UNITS INTO THE SKIN AT BEDTIME. 08/22/21   Nida, Denman George, MD  zinc sulfate 220 (50 Zn) MG capsule Take 1 capsule (220 mg total) by mouth daily. 11/01/22   Emokpae, Courage,  MD    Allergies as of 06/05/2023 - Review Complete 05/28/2023  Allergen Reaction Noted   Demerol [meperidine hcl] Other (See Comments) 01/20/2018    Family History  Problem Relation Age of Onset   Stroke Mother    Pancreatic cancer Father 47       d. 33   Stroke Maternal Grandmother    Heart attack Maternal Grandfather    Heart Problems Paternal Grandfather    Scoliosis Daughter    Muscular dystrophy Grandson    Colon cancer Neg Hx     Social History   Socioeconomic History   Marital status: Married    Spouse name: Not on file   Number of children: 2   Years of education: Not on file   Highest education level: Not on file  Occupational History   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  Tobacco Use   Smoking status: Former    Current packs/day: 0.25    Average packs/day: 0.3 packs/day for 3.8 years (0.9 ttl pk-yrs)    Types: Cigarettes    Start date: 10/06/2019   Smokeless tobacco: Never  Vaping Use   Vaping status: Never Used  Substance and Sexual Activity   Alcohol use: Not Currently    Alcohol/week: 1.0  standard drink of alcohol    Types: 1 Cans of beer per week   Drug use: Never   Sexual activity: Not on file  Other Topics Concern   Not on file  Social History Narrative   Caffiene decaff (2-3 cups daily),  soda (dependent on blood glucose)   Married,  3 children - 4 grandchildren.      Social Determinants of Health   Financial Resource Strain: Medium Risk (12/18/2018)   Overall Financial Resource Strain (CARDIA)    Difficulty of Paying Living Expenses: Somewhat hard  Food Insecurity: No Food Insecurity (05/06/2023)   Hunger Vital Sign    Worried About Running Out of Food in the Last Year: Never true    Ran Out of Food in the Last Year: Never true  Transportation Needs: No Transportation Needs (05/06/2023)   PRAPARE - Administrator, Civil Service (Medical): No    Lack of Transportation (Non-Medical): No  Physical Activity: Inactive (12/18/2018)   Exercise Vital Sign    Days of Exercise per Week: 0 days    Minutes of Exercise per Session: 0 min  Stress: Stress Concern Present (12/18/2018)   [employer] of Occupational Health - Occupational Stress Questionnaire    Feeling of Stress : To some extent  Social Connections: Somewhat Isolated (12/18/2018)   Social Connection and Isolation Panel [NHANES]    Frequency of Communication with Friends and Family: More than three times a week    Frequency of Social Gatherings with Friends and Family: Once a week    Attends Religious Services: More than 4 times per year    Active Member of [employer] or Organizations: No    Attends Banker Meetings: Never    Marital Status: Divorced  Catering manager Violence: Not At Risk (05/06/2023)   Humiliation, Afraid, Rape, and Kick questionnaire    Fear of Current or Ex-Partner: No    Emotionally Abused: No    Physically Abused: No    Sexually Abused: No    Review of Systems: See HPI, otherwise negative ROS  Physical Exam: Vital signs in last 24 hours: Temp:  [97.8 F (36.6  C)] 97.8 F (36.6 C) (11/18 0801) Pulse Rate:  [80-82] 80 (11/18 0815) Resp:  [  15-17] 17 (11/18 0815) BP: (125-126)/(72-73) 126/73 (11/18 0815) SpO2:  [97 %-100 %] 100 % (11/18 0815) Weight:  [95.3 kg] 95.3 kg (11/18 0801)   General:   Alert,  Well-developed, well-nourished, pleasant and cooperative in NAD Head:  Normocephalic and atraumatic. Eyes:  Sclera clear, no icterus.   Conjunctiva pink. Ears:  Normal auditory acuity. Nose:  No deformity, discharge,  or lesions. Msk:  Symmetrical without gross deformities. Normal posture. Extremities:  Without clubbing or edema. Neurologic:  Alert and  oriented x4;  grossly normal neurologically. Skin:  Intact without significant lesions or rashes. Psych:  Alert and cooperative. Normal mood and affect.  Impression/Plan: Mark Foster is here for a colonoscopy to be performed for colon cancer screening purposes.  The risks of the procedure including infection, bleed, or perforation as well as benefits, limitations, alternatives and imponderables have been reviewed with the patient. Questions have been answered. All parties agreeable.

## 2023-07-23 NOTE — Transfer of Care (Signed)
Immediate Anesthesia Transfer of Care Note  Patient: LAROYCE MCDONALD  Procedure(s) Performed: COLONOSCOPY WITH PROPOFOL POLYPECTOMY  Patient Location: Short Stay  Anesthesia Type:General  Level of Consciousness: awake  Airway & Oxygen Therapy: Patient Spontanous Breathing  Post-op Assessment: Report given to RN and Post -op Vital signs reviewed and stable  Post vital signs: Reviewed and stable  Last Vitals:  Vitals Value Taken Time  BP 110/43 07/23/23 0949  Temp 36.3 C 07/23/23 0949  Pulse 64 07/23/23 0949  Resp 13 07/23/23 0949  SpO2 100 % 07/23/23 0949    Last Pain:  Vitals:   07/23/23 0949  TempSrc: Axillary  PainSc: Asleep         Complications: No notable events documented.

## 2023-07-23 NOTE — Discharge Instructions (Addendum)
?  Colonoscopy ?Discharge Instructions ? ?Read the instructions outlined below and refer to this sheet in the next few weeks. These discharge instructions provide you with general information on caring for yourself after you leave the hospital. Your doctor may also give you specific instructions. While your treatment has been planned according to the most current medical practices available, unavoidable complications occasionally occur.  ? ?ACTIVITY ?You may resume your regular activity, but move at a slower pace for the next 24 hours.  ?Take frequent rest periods for the next 24 hours.  ?Walking will help get rid of the air and reduce the bloated feeling in your belly (abdomen).  ?No driving for 24 hours (because of the medicine (anesthesia) used during the test).   ?Do not sign any important legal documents or operate any machinery for 24 hours (because of the anesthesia used during the test).  ?NUTRITION ?Drink plenty of fluids.  ?You may resume your normal diet as instructed by your doctor.  ?Begin with a light meal and progress to your normal diet. Heavy or fried foods are harder to digest and may make you feel sick to your stomach (nauseated).  ?Avoid alcoholic beverages for 24 hours or as instructed.  ?MEDICATIONS ?You may resume your normal medications unless your doctor tells you otherwise.  ?WHAT YOU CAN EXPECT TODAY ?Some feelings of bloating in the abdomen.  ?Passage of more gas than usual.  ?Spotting of blood in your stool or on the toilet paper.  ?IF YOU HAD POLYPS REMOVED DURING THE COLONOSCOPY: ?No aspirin products for 7 days or as instructed.  ?No alcohol for 7 days or as instructed.  ?Eat a soft diet for the next 24 hours.  ?FINDING OUT THE RESULTS OF YOUR TEST ?Not all test results are available during your visit. If your test results are not back during the visit, make an appointment with your caregiver to find out the results. Do not assume everything is normal if you have not heard from your  caregiver or the medical facility. It is important for you to follow up on all of your test results.  ?SEEK IMMEDIATE MEDICAL ATTENTION IF: ?You have more than a spotting of blood in your stool.  ?Your belly is swollen (abdominal distention).  ?You are nauseated or vomiting.  ?You have a temperature over 101.  ?You have abdominal pain or discomfort that is severe or gets worse throughout the day.  ? ?Your colonoscopy revealed 3 polyp(s) which I removed successfully. Await pathology results, my office will contact you. I recommend repeating colonoscopy in 5 years for surveillance purposes. You also have diverticulosis and internal hemorrhoids. I would recommend increasing fiber in your diet or adding OTC Benefiber/Metamucil. Be sure to drink at least 4 to 6 glasses of water daily. Follow-up with GI as needed. ? ? ? ?I hope you have a great rest of your week! ? ?Charles K. Carver, D.O. ?Gastroenterology and Hepatology ?[facility] ? ?

## 2023-07-23 NOTE — Anesthesia Procedure Notes (Signed)
Date/Time: 07/23/2023 9:23 AM  Performed by: Julian Reil, CRNAPre-anesthesia Checklist: Patient identified, Emergency Drugs available, Suction available and Patient being monitored Patient Re-evaluated:Patient Re-evaluated prior to induction Oxygen Delivery Method: Nasal cannula Induction Type: IV induction Placement Confirmation: positive ETCO2

## 2023-07-23 NOTE — Op Note (Signed)
[facility] Patient Name: Mark Foster Procedure Date: 07/23/2023 9:10 AM MRN: [id number] Date of Birth: 10-07-1955 Attending MD: Hennie Duos. Marletta Lor , [place], [id number] CSN: [id number] Age: 67 Admit Type: Outpatient Procedure:                Colonoscopy Indications:              Screening for colorectal malignant neoplasm Providers:                Hennie Duos. Marletta Lor, DO, Francoise Ceo RN, RN, Sheran Fava, Dyann Ruddle Referring MD:              Medicines:                See the Anesthesia note for documentation of the                            administered medications Complications:            No immediate complications. Estimated Blood Loss:     Estimated blood loss was minimal. Procedure:                Pre-Anesthesia Assessment:                           - The anesthesia plan was to use monitored                            anesthesia care (MAC).                           After obtaining informed consent, the colonoscope                            was passed under direct vision. Throughout the                            procedure, the patient's blood pressure, pulse, and                            oxygen saturations were monitored continuously. The                            PCF-HQ190L ([id number]) scope was introduced through                            the anus and advanced to the the cecum, identified                            by appendiceal orifice and ileocecal valve. The                            colonoscopy was performed without difficulty. The                            patient tolerated the  procedure well. The quality                            of the bowel preparation was evaluated using the                            BBPS [facility] Bowel Preparation Scale) with scores                            of: Right Colon = 2 (minor amount of residual                            staining, small fragments of stool and/or opaque                             liquid, but mucosa seen well), Transverse Colon = 2                            (minor amount of residual staining, small fragments                            of stool and/or opaque liquid, but mucosa seen                            well) and Left Colon = 2 (minor amount of residual                            staining, small fragments of stool and/or opaque                            liquid, but mucosa seen well). The total BBPS score                            equals 6. The quality of the bowel preparation was                            good. Scope In: 9:29:11 AM Scope Out: 9:46:27 AM Scope Withdrawal Time: 0 hours 14 minutes 21 seconds  Total Procedure Duration: 0 hours 17 minutes 16 seconds  Findings:      Non-bleeding internal hemorrhoids were found during endoscopy.      Many large-mouthed and small-mouthed diverticula were found in the       sigmoid colon, descending colon and transverse colon.      There was evidence of a prior side-to-side colo-colonic anastomosis in       the sigmoid colon. This was patent and was characterized by healthy       appearing mucosa. The anastomosis was traversed.      A 4 mm polyp was found in the cecum. The polyp was sessile. The polyp       was removed with a cold snare. Resection and retrieval were complete.      Two sessile polyps were found in the transverse colon. The polyps were 4       to 6 mm in size. These polyps  were removed with a cold snare. Resection       and retrieval were complete. Impression:               - Non-bleeding internal hemorrhoids.                           - Diverticulosis in the sigmoid colon, in the                            descending colon and in the transverse colon.                           - Patent side-to-side colo-colonic anastomosis,                            characterized by healthy appearing mucosa.                           - One 4 mm polyp in the cecum, removed with a cold                             snare. Resected and retrieved.                           - Two 4 to 6 mm polyps in the transverse colon,                            removed with a cold snare. Resected and retrieved. Moderate Sedation:      Per Anesthesia Care Recommendation:           - Patient has a contact number available for                            emergencies. The signs and symptoms of potential                            delayed complications were discussed with the                            patient. Return to normal activities tomorrow.                            Written discharge instructions were provided to the                            patient.                           - Resume previous diet.                           - Continue present medications.                           - Await pathology results.                           -  Repeat colonoscopy in 5 years for surveillance.                           - Return to GI clinic PRN. Procedure Code(s):        --- Professional ---                           [phone], Colonoscopy, flexible; with removal of                            tumor(s), polyp(s), or other lesion(s) by snare                            technique Diagnosis Code(s):        --- Professional ---                           Z12.11, Encounter for screening for malignant                            neoplasm of colon                           K64.8, Other hemorrhoids                           Z98.0, Intestinal bypass and anastomosis status                           D12.0, Benign neoplasm of cecum                           D12.3, Benign neoplasm of transverse colon (hepatic                            flexure or splenic flexure)                           K57.30, Diverticulosis of large intestine without                            perforation or abscess without bleeding CPT copyright 2022 [employer]. All rights reserved. The codes documented in this report are preliminary and upon coder  review may  be revised to meet current compliance requirements. Hennie Duos. Marletta Lor, DO Hennie Duos. Marletta Lor, DO 07/23/2023 9:52:57 AM This report has been signed electronically. Number of Addenda: 0

## 2023-07-24 LAB — SURGICAL PATHOLOGY

## 2023-07-25 DIAGNOSIS — D649 Anemia, unspecified: Secondary | ICD-10-CM | POA: Diagnosis not present

## 2023-07-25 DIAGNOSIS — E782 Mixed hyperlipidemia: Secondary | ICD-10-CM | POA: Diagnosis not present

## 2023-07-25 DIAGNOSIS — I10 Essential (primary) hypertension: Secondary | ICD-10-CM | POA: Diagnosis not present

## 2023-07-25 DIAGNOSIS — E559 Vitamin D deficiency, unspecified: Secondary | ICD-10-CM | POA: Diagnosis not present

## 2023-07-25 DIAGNOSIS — E039 Hypothyroidism, unspecified: Secondary | ICD-10-CM | POA: Diagnosis not present

## 2023-07-25 DIAGNOSIS — E1169 Type 2 diabetes mellitus with other specified complication: Secondary | ICD-10-CM | POA: Diagnosis not present

## 2023-07-25 DIAGNOSIS — E538 Deficiency of other specified B group vitamins: Secondary | ICD-10-CM | POA: Diagnosis not present

## 2023-07-27 ENCOUNTER — Encounter (HOSPITAL_COMMUNITY): Payer: Self-pay | Admitting: Internal Medicine

## 2023-08-08 ENCOUNTER — Other Ambulatory Visit (HOSPITAL_COMMUNITY): Payer: Self-pay | Admitting: Family Medicine

## 2023-08-08 DIAGNOSIS — R059 Cough, unspecified: Secondary | ICD-10-CM | POA: Diagnosis not present

## 2023-08-08 DIAGNOSIS — M4802 Spinal stenosis, cervical region: Secondary | ICD-10-CM | POA: Diagnosis not present

## 2023-08-08 DIAGNOSIS — I10 Essential (primary) hypertension: Secondary | ICD-10-CM | POA: Diagnosis not present

## 2023-08-08 DIAGNOSIS — E782 Mixed hyperlipidemia: Secondary | ICD-10-CM | POA: Diagnosis not present

## 2023-08-08 DIAGNOSIS — N400 Enlarged prostate without lower urinary tract symptoms: Secondary | ICD-10-CM | POA: Diagnosis not present

## 2023-08-08 DIAGNOSIS — Z0001 Encounter for general adult medical examination with abnormal findings: Secondary | ICD-10-CM | POA: Diagnosis not present

## 2023-08-08 DIAGNOSIS — E039 Hypothyroidism, unspecified: Secondary | ICD-10-CM | POA: Diagnosis not present

## 2023-08-08 DIAGNOSIS — E1169 Type 2 diabetes mellitus with other specified complication: Secondary | ICD-10-CM | POA: Diagnosis not present

## 2023-08-08 DIAGNOSIS — D649 Anemia, unspecified: Secondary | ICD-10-CM | POA: Diagnosis not present

## 2023-08-08 DIAGNOSIS — M545 Low back pain, unspecified: Secondary | ICD-10-CM | POA: Diagnosis not present

## 2023-08-08 DIAGNOSIS — Z8701 Personal history of pneumonia (recurrent): Secondary | ICD-10-CM

## 2023-08-08 DIAGNOSIS — G40309 Generalized idiopathic epilepsy and epileptic syndromes, not intractable, without status epilepticus: Secondary | ICD-10-CM | POA: Diagnosis not present

## 2023-08-08 DIAGNOSIS — I2510 Atherosclerotic heart disease of native coronary artery without angina pectoris: Secondary | ICD-10-CM | POA: Diagnosis not present

## 2023-08-14 ENCOUNTER — Ambulatory Visit (HOSPITAL_COMMUNITY)
Admission: RE | Admit: 2023-08-14 | Discharge: 2023-08-14 | Disposition: A | Discharge status: Home / Self Care | Payer: PPO | Source: Ambulatory Visit | Attending: Family Medicine | Admitting: Family Medicine

## 2023-08-14 DIAGNOSIS — Z8701 Personal history of pneumonia (recurrent): Secondary | ICD-10-CM

## 2023-08-14 DIAGNOSIS — R053 Chronic cough: Secondary | ICD-10-CM | POA: Diagnosis not present

## 2023-11-20 DIAGNOSIS — G8929 Other chronic pain: Secondary | ICD-10-CM | POA: Diagnosis not present

## 2023-11-20 DIAGNOSIS — M545 Low back pain, unspecified: Secondary | ICD-10-CM | POA: Diagnosis not present

## 2023-12-04 DIAGNOSIS — E559 Vitamin D deficiency, unspecified: Secondary | ICD-10-CM | POA: Diagnosis not present

## 2023-12-04 DIAGNOSIS — E039 Hypothyroidism, unspecified: Secondary | ICD-10-CM | POA: Diagnosis not present

## 2023-12-04 DIAGNOSIS — I10 Essential (primary) hypertension: Secondary | ICD-10-CM | POA: Diagnosis not present

## 2023-12-04 DIAGNOSIS — E782 Mixed hyperlipidemia: Secondary | ICD-10-CM | POA: Diagnosis not present

## 2023-12-04 DIAGNOSIS — E538 Deficiency of other specified B group vitamins: Secondary | ICD-10-CM | POA: Diagnosis not present

## 2023-12-04 DIAGNOSIS — D649 Anemia, unspecified: Secondary | ICD-10-CM | POA: Diagnosis not present

## 2023-12-04 DIAGNOSIS — E1169 Type 2 diabetes mellitus with other specified complication: Secondary | ICD-10-CM | POA: Diagnosis not present

## 2023-12-07 DIAGNOSIS — D696 Thrombocytopenia, unspecified: Secondary | ICD-10-CM | POA: Diagnosis not present

## 2023-12-07 DIAGNOSIS — E1165 Type 2 diabetes mellitus with hyperglycemia: Secondary | ICD-10-CM | POA: Diagnosis not present

## 2023-12-07 DIAGNOSIS — M4802 Spinal stenosis, cervical region: Secondary | ICD-10-CM | POA: Diagnosis not present

## 2023-12-07 DIAGNOSIS — I2510 Atherosclerotic heart disease of native coronary artery without angina pectoris: Secondary | ICD-10-CM | POA: Diagnosis not present

## 2023-12-07 DIAGNOSIS — G40309 Generalized idiopathic epilepsy and epileptic syndromes, not intractable, without status epilepticus: Secondary | ICD-10-CM | POA: Diagnosis not present

## 2023-12-07 DIAGNOSIS — I10 Essential (primary) hypertension: Secondary | ICD-10-CM | POA: Diagnosis not present

## 2023-12-07 DIAGNOSIS — E1169 Type 2 diabetes mellitus with other specified complication: Secondary | ICD-10-CM | POA: Diagnosis not present

## 2023-12-07 DIAGNOSIS — N400 Enlarged prostate without lower urinary tract symptoms: Secondary | ICD-10-CM | POA: Diagnosis not present

## 2023-12-07 DIAGNOSIS — D649 Anemia, unspecified: Secondary | ICD-10-CM | POA: Diagnosis not present

## 2023-12-07 DIAGNOSIS — E782 Mixed hyperlipidemia: Secondary | ICD-10-CM | POA: Diagnosis not present

## 2023-12-07 DIAGNOSIS — E039 Hypothyroidism, unspecified: Secondary | ICD-10-CM | POA: Diagnosis not present

## 2023-12-07 DIAGNOSIS — M545 Low back pain, unspecified: Secondary | ICD-10-CM | POA: Diagnosis not present

## 2023-12-18 ENCOUNTER — Encounter: Payer: Self-pay | Admitting: Adult Health

## 2023-12-18 ENCOUNTER — Telehealth: Payer: Self-pay | Admitting: Adult Health

## 2023-12-18 NOTE — Telephone Encounter (Signed)
 Unable to reach pt over the phone, invalid phone #. Sent mychart msg informing pt of need to reschedule 04/28/24 appt - NP out

## 2023-12-20 ENCOUNTER — Ambulatory Visit (HOSPITAL_COMMUNITY)
Admission: RE | Admit: 2023-12-20 | Discharge: 2023-12-20 | Disposition: A | Discharge status: Home / Self Care | Payer: PPO | Source: Ambulatory Visit | Attending: Hematology | Admitting: Hematology

## 2023-12-20 ENCOUNTER — Inpatient Hospital Stay: Payer: PPO | Attending: Hematology

## 2023-12-20 DIAGNOSIS — K8020 Calculus of gallbladder without cholecystitis without obstruction: Secondary | ICD-10-CM | POA: Diagnosis not present

## 2023-12-20 DIAGNOSIS — Z79899 Other long term (current) drug therapy: Secondary | ICD-10-CM | POA: Insufficient documentation

## 2023-12-20 DIAGNOSIS — D696 Thrombocytopenia, unspecified: Secondary | ICD-10-CM | POA: Insufficient documentation

## 2023-12-20 DIAGNOSIS — Z8507 Personal history of malignant neoplasm of pancreas: Secondary | ICD-10-CM | POA: Insufficient documentation

## 2023-12-20 DIAGNOSIS — I700 Atherosclerosis of aorta: Secondary | ICD-10-CM | POA: Insufficient documentation

## 2023-12-20 DIAGNOSIS — Z800 Family history of malignant neoplasm of digestive organs: Secondary | ICD-10-CM | POA: Insufficient documentation

## 2023-12-20 DIAGNOSIS — Z9221 Personal history of antineoplastic chemotherapy: Secondary | ICD-10-CM | POA: Insufficient documentation

## 2023-12-20 DIAGNOSIS — D709 Neutropenia, unspecified: Secondary | ICD-10-CM | POA: Insufficient documentation

## 2023-12-20 DIAGNOSIS — C259 Malignant neoplasm of pancreas, unspecified: Secondary | ICD-10-CM | POA: Insufficient documentation

## 2023-12-20 DIAGNOSIS — Z87891 Personal history of nicotine dependence: Secondary | ICD-10-CM | POA: Insufficient documentation

## 2023-12-20 DIAGNOSIS — K5730 Diverticulosis of large intestine without perforation or abscess without bleeding: Secondary | ICD-10-CM | POA: Insufficient documentation

## 2023-12-20 DIAGNOSIS — D61818 Other pancytopenia: Secondary | ICD-10-CM | POA: Insufficient documentation

## 2023-12-20 DIAGNOSIS — Z9081 Acquired absence of spleen: Secondary | ICD-10-CM | POA: Insufficient documentation

## 2023-12-20 LAB — COMPREHENSIVE METABOLIC PANEL WITH GFR
ALT: 17 U/L (ref 0–44)
AST: 18 U/L (ref 15–41)
Albumin: 3.6 g/dL (ref 3.5–5.0)
Alkaline Phosphatase: 58 U/L (ref 38–126)
Anion gap: 7 (ref 5–15)
BUN: 19 mg/dL (ref 8–23)
CO2: 26 mmol/L (ref 22–32)
Calcium: 8.9 mg/dL (ref 8.9–10.3)
Chloride: 102 mmol/L (ref 98–111)
Creatinine, Ser: 0.78 mg/dL (ref 0.61–1.24)
GFR, Estimated: >60 mL/min (ref >60)
Glucose, Bld: 119 mg/dL — ABNORMAL HIGH (ref 70–99)
Potassium: 4.3 mmol/L (ref 3.5–5.1)
Sodium: 135 mmol/L (ref 135–145)
Total Bilirubin: 0.9 mg/dL (ref 0.0–1.2)
Total Protein: 6.9 g/dL (ref 6.5–8.1)

## 2023-12-20 LAB — CBC WITH DIFFERENTIAL/PLATELET
Abs Immature Granulocytes: 0 10*3/uL (ref 0.00–0.07)
Basophils Absolute: 0 10*3/uL (ref 0.0–0.1)
Basophils Relative: 0 %
Eosinophils Absolute: 0.1 10*3/uL (ref 0.0–0.5)
Eosinophils Relative: 3 %
HCT: 33.5 % — ABNORMAL LOW (ref 39.0–52.0)
Hemoglobin: 11.4 g/dL — ABNORMAL LOW (ref 13.0–17.0)
Lymphocytes Relative: 73 %
Lymphs Abs: 1.7 10*3/uL (ref 0.7–4.0)
MCH: 36.5 pg — ABNORMAL HIGH (ref 26.0–34.0)
MCHC: 34 g/dL (ref 30.0–36.0)
MCV: 107.4 fL — ABNORMAL HIGH (ref 80.0–100.0)
Monocytes Absolute: 0.1 10*3/uL (ref 0.1–1.0)
Monocytes Relative: 3 %
Neutro Abs: 0.5 10*3/uL — ABNORMAL LOW (ref 1.7–7.7)
Neutrophils Relative %: 21 %
Platelets: 82 10*3/uL — ABNORMAL LOW (ref 150–400)
RBC: 3.12 MIL/uL — ABNORMAL LOW (ref 4.22–5.81)
RDW: 15.7 % — ABNORMAL HIGH (ref 11.5–15.5)
Smear Review: DECREASED
WBC: 2.3 10*3/uL — ABNORMAL LOW (ref 4.0–10.5)
nRBC: 0 % (ref 0.0–0.2)

## 2023-12-20 MED ORDER — IOHEXOL 300 MG/ML  SOLN
100.0000 mL | Freq: Once | INTRAMUSCULAR | Status: AC | PRN
  Administered 2023-12-20: 100 mL via INTRAVENOUS

## 2023-12-21 LAB — CANCER ANTIGEN 19-9: CA 19-9: 10 U/mL (ref 0–35)

## 2023-12-27 ENCOUNTER — Inpatient Hospital Stay (HOSPITAL_BASED_OUTPATIENT_CLINIC_OR_DEPARTMENT_OTHER): Payer: PPO | Admitting: Hematology

## 2023-12-27 ENCOUNTER — Inpatient Hospital Stay

## 2023-12-27 VITALS — BP 131/64 | HR 73 | Temp 98.2°F | Resp 16 | Wt 215.6 lb

## 2023-12-27 DIAGNOSIS — Z87891 Personal history of nicotine dependence: Secondary | ICD-10-CM | POA: Diagnosis not present

## 2023-12-27 DIAGNOSIS — D696 Thrombocytopenia, unspecified: Secondary | ICD-10-CM

## 2023-12-27 DIAGNOSIS — D709 Neutropenia, unspecified: Secondary | ICD-10-CM | POA: Diagnosis not present

## 2023-12-27 DIAGNOSIS — Z79899 Other long term (current) drug therapy: Secondary | ICD-10-CM | POA: Diagnosis not present

## 2023-12-27 DIAGNOSIS — K5730 Diverticulosis of large intestine without perforation or abscess without bleeding: Secondary | ICD-10-CM | POA: Diagnosis not present

## 2023-12-27 DIAGNOSIS — Z9081 Acquired absence of spleen: Secondary | ICD-10-CM | POA: Diagnosis not present

## 2023-12-27 DIAGNOSIS — Z9221 Personal history of antineoplastic chemotherapy: Secondary | ICD-10-CM | POA: Diagnosis not present

## 2023-12-27 DIAGNOSIS — Z800 Family history of malignant neoplasm of digestive organs: Secondary | ICD-10-CM | POA: Diagnosis not present

## 2023-12-27 DIAGNOSIS — I700 Atherosclerosis of aorta: Secondary | ICD-10-CM | POA: Diagnosis not present

## 2023-12-27 DIAGNOSIS — D61818 Other pancytopenia: Secondary | ICD-10-CM | POA: Diagnosis not present

## 2023-12-27 DIAGNOSIS — Z8507 Personal history of malignant neoplasm of pancreas: Secondary | ICD-10-CM | POA: Diagnosis not present

## 2023-12-27 LAB — IRON AND TIBC
Iron: 120 ug/dL (ref 45–182)
Saturation Ratios: 40 % — ABNORMAL HIGH (ref 17.9–39.5)
TIBC: 302 ug/dL (ref 250–450)
UIBC: 182 ug/dL

## 2023-12-27 LAB — FOLATE: Folate: 10.5 ng/mL (ref >5.9)

## 2023-12-27 LAB — VITAMIN B12: Vitamin B-12: 1364 pg/mL — ABNORMAL HIGH (ref 180–914)

## 2023-12-27 LAB — RETICULOCYTES
Immature Retic Fract: 15.1 % (ref 2.3–15.9)
RBC.: 3.25 MIL/uL — ABNORMAL LOW (ref 4.22–5.81)
Retic Count, Absolute: 54.3 10*3/uL (ref 19.0–186.0)
Retic Ct Pct: 1.7 % (ref 0.4–3.1)

## 2023-12-27 LAB — TSH: TSH: 1.654 u[IU]/mL (ref 0.350–4.500)

## 2023-12-27 LAB — LACTATE DEHYDROGENASE: LDH: 185 U/L (ref 98–192)

## 2023-12-27 LAB — FERRITIN: Ferritin: 120 ng/mL (ref 24–336)

## 2023-12-27 NOTE — Progress Notes (Signed)
 Mark Foster - [facility] [address] [place] [address]    Clinic Day:  12/27/2023  Referring physician: Omie Bickers, MD  Patient Care Team: Omie Bickers, MD as PCP - General (Internal Medicine) Gerard Knight, MD as PCP - Cardiology (Cardiology) Paulett Boros, MD as Consulting Physician (Hematology and Oncology)   ASSESSMENT & PLAN:   Assessment: 1.  Stage IIb (T2N1) pancreatic adenocarcinoma: -Status post distal pancreatectomy and splenectomy at [facility] by Dr. Leighton Punches on 11/20/2018. 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 -12 cycles of FOLFIRINOX from 12/30/2018 through 06/04/2019. -MRI of the brain on 05/19/2019 did not show any abnormalities.  This was done because of new onset numbness in the left lower lip during therapy. -CT CAP on 03/24/2020 shows unchanged low-attenuation lesion of the pancreatic uncinate measuring 1.2 x 0.8 cm.  Unchanged low-attenuation lesion near the surgical margin of the pancreatic head and neck junction measuring 1.2 x 0.9 cm.  These may reflect postoperative seroma.  Unchanged prominent retroperitoneal lymph nodes.   2.  Post splenectomy state: -He received vaccination prior to splenectomy.    Plan: 1.  Stage IIb (T2N1) pancreatic adenocarcinoma: - Denies any abdominal pains or other GI symptoms like nausea/vomiting/diarrhea. - Labs today: Normal LFTs.  CA 19-9 is normal. - CTAP on 12/20/2023: No evidence of recurrence.  Other benign findings were discussed. - We will monitor tumor marker, LFTs and CT scan in 6 months.  2.  Pancytopenia: - He had on and off cytopenias for the last few months.  Neutrophil count has worsened to 0.5 and platelet count 82. - Recommend further workup for nutritional deficiencies, bone marrow infiltrative process.  Will also send NGS myeloid panel. - If the above tests are nonconclusive, will proceed with bone marrow aspiration and biopsy.  He will follow-up with us  in 3 weeks.       Orders  Placed This Encounter  Procedures   IntelliGEN Myeloid    Standing Status:   Future    Number of Occurrences:   1    Expected Date:   12/27/2023    Expiration Date:   12/26/2024   Iron and TIBC ([facility] ONLY)    Standing Status:   Future    Number of Occurrences:   1    Expected Date:   12/27/2023    Expiration Date:   12/26/2024   Ferritin    Standing Status:   Future    Number of Occurrences:   1    Expected Date:   12/27/2023    Expiration Date:   12/26/2024   Vitamin B12    Standing Status:   Future    Number of Occurrences:   1    Expected Date:   12/27/2023    Expiration Date:   12/26/2024   Folate    Standing Status:   Future    Number of Occurrences:   1    Expected Date:   12/27/2023    Expiration Date:   12/26/2024   Kappa/lambda light chains    Standing Status:   Future    Number of Occurrences:   1    Expected Date:   12/27/2023    Expiration Date:   12/26/2024   Immunofixation electrophoresis    Standing Status:   Future    Number of Occurrences:   1    Expected Date:   12/27/2023    Expiration Date:   12/26/2024   Protein electrophoresis, serum  Standing Status:   Future    Number of Occurrences:   1    Expected Date:   12/27/2023    Expiration Date:   12/26/2024   Reticulocytes    Standing Status:   Future    Number of Occurrences:   1    Expected Date:   12/27/2023    Expiration Date:   12/26/2024   Lactate dehydrogenase    Standing Status:   Future    Number of Occurrences:   1    Expected Date:   12/27/2023    Expiration Date:   12/26/2024   Methylmalonic acid, serum    Standing Status:   Future    Number of Occurrences:   1    Expected Date:   12/27/2023    Expiration Date:   12/26/2024   Copper , serum    Standing Status:   Future    Number of Occurrences:   1    Expected Date:   12/27/2023    Expiration Date:   12/26/2024   TSH    Standing Status:   Future    Number of Occurrences:   1    Expected Date:   12/27/2023    Expiration  Date:   12/26/2024      Hurman Maiden R Teague,acting as a scribe for Paulett Boros, MD.,have documented all relevant documentation on the behalf of Paulett Boros, MD,as directed by  Paulett Boros, MD while in the presence of Paulett Boros, MD.  I, Paulett Boros MD, have reviewed the above documentation for accuracy and completeness, and I agree with the above.     Paulett Boros, MD   4/24/202512:37 PM  CHIEF COMPLAINT:   Diagnosis: pancreatic adenocarcinoma    Cancer Staging  No matching staging information was found for the patient.    Prior Therapy: 1. Distal pancreatectomy and splenectomy at [facility] on 11/20/2018. 2. Adjuvant FOLFIRINOX x 12 cycles from 12/30/2018 to 06/04/2019.  Current Therapy:  Surveillance    HISTORY OF PRESENT ILLNESS:   Oncology History  Pancreatic adenocarcinoma (HCC)  08/20/2018 Imaging   CT abdomen/pelvis w/ contrast: IMPRESSION: Atrophy and ductal dilatation involving the pancreatic tail, with suspected small soft tissue mass in the pancreatic body which could represent pancreatic carcinoma. Abdomen MRI and MRCP without and with contrast is recommended for further evaluation.   No evidence of hepatobiliary disease.   08/30/2018 Imaging   MRI abdomen w/ contrast: IMPRESSION: 1. Although not definitive, there remains concern of a small hypoenhancing mass at the junction of the pancreatic body and tail associated with atrophy and ductal dilatation in the pancreatic tail. This remains concerning for pancreatic neoplasm. Postinflammatory stricture less likely. Besides a tiny cystic lesion in the pancreatic tail, there are no other signs of previous pancreatitis. Further evaluation with endoscopic ultrasound for possible biopsy strongly recommended. 2. No evidence of metastatic disease. 3. Cholelithiasis without evidence of cholecystitis or biliary dilatation.   09/03/2018 Initial Diagnosis   Pancreatic  adenocarcinoma (HCC)   10/03/2018 Procedure   EUS: - 2.6cm irregularly shaped mass in the body of the pancreas that causing main pancreatic duct obstruction and dilation. The mass abuts the splenic vessels but no other significant vascular structures and it was sampled with trangastric EUS FNA. Preliminary cytology is + for malignancy, likely well-differentiated adenocarcinoma. It appears surgically resectable.   10/03/2018 Pathology Results   Accession: [id number]  FINE NEEDLE ASPIRATION, ENDOSCOPIC, PANCREAS BODY (SPECIMEN 1 OF 1 COLLECTED 10/03/18): ADENOCARCINOMA.   10/17/2018 Imaging   PET:  IMPRESSION: 1. Tiny focus of hypermetabolism identified in the body of pancreas, adjacent to the abrupt cut off of the main pancreatic duct. No evidence for hypermetabolic metastatic disease in the neck, chest, abdomen, or pelvis. 2. Cholelithiasis. 3.  Aortic Atherosclerois (ICD10-170.0) 4. 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  The Hereditary Gene Panel offered by [facility] includes sequencing and/or deletion duplication testing of the following 47 genes: APC, ATM, AXIN2, BARD1, BMPR1A, BRCA1, BRCA2, BRIP1, CDH1, CDK4, CDKN2A (p14ARF), CDKN2A (p16INK4a), CHEK2, CTNNA1, DICER1, EPCAM (Deletion/duplication testing only), GREM1 (promoter region deletion/duplication testing only), KIT, MEN1, MLH1, MSH2, MSH3, MSH6, MUTYH, NBN, NF1, NHTL1, PALB2, PDGFRA, PMS2, POLD1, POLE, PTEN, RAD50, RAD51C, RAD51D, SDHB, SDHC, SDHD, SMAD4, SMARCA4. STK11, TP53, TSC1, TSC2, and VHL.  The following genes were evaluated for sequence changes only: SDHA and HOXB13 c.251G>A variant only. The report date is November 12, 2018.  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 The change in variant classification was made as a result of re-review of the evidence in light of new variant  interpretation guidelines and/or new information. The amended report date is March 11, 2020.    11/20/2018 Surgery   Distal subtotal pancreatectomy with splenectomy at [facility]   11/20/2018 Surgery   TUMOR    Tumor Site:    Pancreatic body     Histologic Type:    Ductal adenocarcinoma     Histologic Grade:    G1: Well differentiated     Tumor Size:    Greatest dimension in Centimeters (cm): 2.8 Centimeters (cm)      Additional Dimension in Centimeters (cm):    2.7 Centimeters (cm)      Additional Dimension in Centimeters (cm):    1.8 Centimeters (cm)    Tumor Extent:          Tumor Extension:    Tumor is confined to pancreas     Accessory Findings:          Treatment Effect:    No known presurgical therapy       Lymphovascular Invasion:    Not identified       Perineural Invasion:    Not identified   MARGINS    Margins:          Proximal Pancreatic Parenchymal Margin:    Uninvolved by invasive carcinoma and pancreatic high-grade intraepithelial neoplasia         Distance of Invasive Carcinoma from Margin:    0.6 Centimeters (cm)    :          Other Margin:    Splenic margin.         Margin Status:    Uninvolved by invasive carcinoma   LYMPH NODES  Number of Lymph Nodes Involved:    2   Number of Lymph Nodes Examined:    16   PATHOLOGIC STAGE CLASSIFICATION  (pTNM, AJCC 8th Edition)  TNM Descriptors:    Not applicable   Primary Tumor (pT):    pT2   Regional Lymph Nodes (pN):    pN1   ADDITIONAL FINDINGS  Additional Pathologic Findings:    Pancreatic intraepithelial neoplasia     Highest Grade (PanIN):    High grade PanIN is present.   Additional Pathologic Findings:    Benign unilocular pancreatic cyst (1.3 cm in greatest dimension) at the pancreatic tail.   12/30/2018 - 06/06/2019 Chemotherapy   Patient is  on Treatment Plan : PANCREAS FOLFIRINOX q14d        INTERVAL HISTORY:   Tyree is a 68 y.o. male presenting to clinic today for follow up of pancreatic adenocarcinoma. He was  last seen by me on 06/28/23.  Since his last visit, he had CT AP on 12/20/23 that found: Stable exam.  No evidence of recurrent or metastatic disease. Colonic diverticulosis. No bowel obstruction. Cholelithiasis. Aortic Atherosclerosis.  Clayvon underwent a colonoscopy on 07/23/23 under Dr. Mordechai April. Pathology of polypectomies in the cecum and transverse colon revealed: tubular adenoma.   Today, he states that he is doing well overall. His appetite level is at 50%. His energy level is at 25%. Jeronimo is accompanied by his wife.   He denies any medication changes or taking antibiotics recently. He denies any infections in the last 6 months, fevers, or night sweats.  Neel reports he had 2 separate infections of pneumonia in November 2024.  He notes normal appetite and energy levels.   PAST MEDICAL HISTORY:   Past Medical History: Past Medical History:  Diagnosis Date   Bilateral carpal tunnel syndrome 10/19/2020   CAD (coronary artery disease)    a. 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   Type 2 diabetes mellitus (HCC)     Surgical History: Past Surgical History:  Procedure Laterality Date   APPENDECTOMY     BUBBLE STUDY N/A 11/18/2019   Procedure: BUBBLE STUDY;  Surgeon: Flavia Hughs, MD;  Location: [facility];  Service: Cardiology;  Laterality: N/A;   CARDIAC SURGERY     COLON RESECTION  1995   For diverticulitis   COLONOSCOPY WITH PROPOFOL  N/A 07/23/2023   Procedure: COLONOSCOPY WITH PROPOFOL ;  Surgeon: Vinetta Greening, DO;  Location: [facility];  Service: Endoscopy;  Laterality:  N/A;  9:15 am, asa 3   CORONARY ARTERY BYPASS GRAFT N/A 01/26/2020   Procedure: CORONARY ARTERY BYPASS GRAFTING (CABG) TIMES FOUR USING LEFT INTERNAL MAMMARY VEIN AND RIGHT GREATER SAPHENOUS VEIN;  Surgeon: Zelphia Higashi, MD;  Location: [facility] OR;  Service: Open Heart Surgery;  Laterality: N/A;   ENDOVEIN HARVEST OF GREATER SAPHENOUS VEIN Right 01/26/2020   Procedure: Astrid Blamer Of Greater Saphenous Vein;  Surgeon: Zelphia Higashi, MD;  [facility] OR;  Service: Open Heart Surgery;  Laterality: Right;   ESOPHAGOGASTRODUODENOSCOPY N/A 10/03/2018   Procedure: ESOPHAGOGASTRODUODENOSCOPY (EGD);  Surgeon: Janel Medford, MD;  Location: [facility];  Service: Endoscopy;  Laterality: N/A;   EUS N/A 10/03/2018   Procedure: UPPER ENDOSCOPIC ULTRASOUND (EUS) RADIAL;  Surgeon: Janel Medford, MD;  Location: [facility] ENDOSCOPY;  Service: Endoscopy;  Laterality: N/A;   EYE SURGERY Bilateral    FINE NEEDLE ASPIRATION N/A 10/03/2018   Procedure: FINE NEEDLE ASPIRATION (FNA) LINEAR;  Surgeon: Janel Medford, MD;  Location: [facility] ENDOSCOPY;  Service: Endoscopy;  Laterality: N/A;   IR ANGIO INTRA EXTRACRAN SEL COM CAROTID INNOMINATE BILAT MOD SED  01/21/2018   IR ANGIO INTRA EXTRACRAN SEL COM CAROTID INNOMINATE UNI R MOD SED  03/21/2018  IR ANGIO VERTEBRAL SEL VERTEBRAL BILAT MOD SED  01/21/2018   IR TRANSCATH EXCRAN VERT OR CAR A STENT  03/21/2018   LEFT HEART CATH AND CORONARY ANGIOGRAPHY N/A 01/08/2020   Procedure: LEFT HEART CATH AND CORONARY ANGIOGRAPHY;  Surgeon: Arleen Lacer, MD;  Location: [facility] INVASIVE CV LAB;  Service: Cardiovascular;  Laterality: N/A;   POLYPECTOMY  07/23/2023   Procedure: POLYPECTOMY;  Surgeon: Vinetta Greening, DO;  Location: [facility];  Service: Endoscopy;;   PORT-A-CATH REMOVAL N/A 06/20/2021   Procedure: MINOR REMOVAL PORT-A-CATH;  Surgeon: Awilda Bogus, MD;  Location: [facility];  Service: General;  Laterality: N/A;   PORTACATH PLACEMENT Left 12/23/2018    Procedure: INSERTION PORT-A-CATH;  Surgeon: Awilda Bogus, MD;  Location: [facility];  Service: General;  Laterality: Left;   RADIOLOGY WITH ANESTHESIA N/A 03/21/2018   Procedure: IR WITH ANESTHESIA WITH STENT PLACEMENT;  Surgeon: Luellen Sages, MD;  Location: [facility] OR;  Service: Radiology;  Laterality: N/A;   ROTATOR CUFF REPAIR     Left   SPLENECTOMY     TEE WITHOUT CARDIOVERSION N/A 11/18/2019   Procedure: TRANSESOPHAGEAL ECHOCARDIOGRAM (TEE) WITH PROPOFOL ;  Surgeon: Flavia Hughs, MD;  Location: [facility];  Service: Cardiology;  Laterality: N/A;   TEE WITHOUT CARDIOVERSION N/A 01/26/2020   Procedure: TRANSESOPHAGEAL ECHOCARDIOGRAM (TEE);  Surgeon: Zelphia Higashi, MD;  Location: [facility] OR;  Service: Open Heart Surgery;  Laterality: N/A;   TONSILLECTOMY      Social History: Social History   Socioeconomic History   Marital status: Married    Spouse name: Not on file   Number of children: 2   Years of education: Not on file   Highest education level: Not on file  Occupational History   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  Tobacco Use   Smoking status: Former    Current packs/day: 0.25    Average packs/day: 0.3 packs/day for 4.2 years (1.1 ttl pk-yrs)    Types: Cigarettes    Start date: 10/06/2019   Smokeless tobacco: Never  Vaping Use   Vaping status: Never Used  Substance and Sexual Activity   Alcohol use: Not Currently    Alcohol/week: 1.0 standard drink of alcohol    Types: 1 Cans of beer per week   Drug use: Never   Sexual activity: Not on file  Other Topics Concern   Not on file  Social History Narrative   Caffiene decaff (2-3 cups daily),  soda (dependent on blood glucose)   Married,  3 children - 4 grandchildren.      Social Drivers of Health   Financial Resource Strain: Medium Risk (12/18/2018)   Overall Financial Resource Strain (CARDIA)    Difficulty of Paying Living Expenses: Somewhat hard  Food Insecurity: No Food Insecurity (05/06/2023)   Hunger Vital  Sign    Worried About Running Out of Food in the Last Year: Never true    Ran Out of Food in the Last Year: Never true  Transportation Needs: No Transportation Needs (05/06/2023)   PRAPARE - Administrator, Civil Service (Medical): No    Lack of Transportation (Non-Medical): No  Physical Activity: Inactive (12/18/2018)   Exercise Vital Sign    Days of Exercise per Week: 0 days    Minutes of Exercise per Session: 0 min  Stress: Stress Concern Present (12/18/2018)   [employer] of Occupational Health - Occupational Stress Questionnaire    Feeling of Stress : To some extent  Social Connections: Somewhat Isolated (12/18/2018)  Social Advertising account executive [NHANES]    Frequency of Communication with Friends and Family: More than three times a week    Frequency of Social Gatherings with Friends and Family: Once a week    Attends Religious Services: More than 4 times per year    Active Member of [employer] or Organizations: No    Attends Banker Meetings: Never    Marital Status: Divorced  Catering manager Violence: Not At Risk (05/06/2023)   Humiliation, Afraid, Rape, and Kick questionnaire    Fear of Current or Ex-Partner: No    Emotionally Abused: No    Physically Abused: No    Sexually Abused: No    Family History: Family History  Problem Relation Age of Onset   Stroke Mother    Pancreatic cancer Father 81       d. 50   Stroke Maternal Grandmother    Heart attack Maternal Grandfather    Heart Problems Paternal Grandfather    Scoliosis Daughter    Muscular dystrophy Grandson    Colon cancer Neg Hx     Current Medications:  Current Outpatient Medications:    albuterol  (VENTOLIN  HFA) 108 (90 Base) MCG/ACT inhaler, Inhale into the lungs., Disp: , Rfl:    aspirin  EC 81 MG tablet, Take 1 tablet (81 mg total) by mouth daily with breakfast., Disp: 90 tablet, Rfl: 3   atorvastatin  (LIPITOR ) 80 MG tablet, Take 1 tablet (80 mg total) by mouth daily at 6  PM. (Patient taking differently: Take 80 mg by mouth daily.), Disp: 90 tablet, Rfl: 1   Cholecalciferol  (VITAMIN D3) 25 MCG (1000 UT) capsule, Take 1,000 Units by mouth daily., Disp: , Rfl:    CINNAMON PO, Take by mouth., Disp: , Rfl:    cyanocobalamin  (VITAMIN B12) 1000 MCG tablet, Take 1 tablet (1,000 mcg total) by mouth daily., Disp: 90 tablet, Rfl: 3   ECHINACEA COMPLEX PO, Take 1,000 mg by mouth daily., Disp: , Rfl:    ezetimibe  (ZETIA ) 10 MG tablet, Take by mouth., Disp: , Rfl:    gabapentin  (NEURONTIN ) 300 MG capsule, Take 1 capsule (300 mg total) by mouth 3 (three) times daily., Disp: 270 capsule, Rfl: 3   hydrocortisone  (ANUSOL -HC) 2.5 % rectal cream, Place 1 Application rectally 2 (two) times daily. For 14 days, Disp: 30 g, Rfl: 1   insulin  aspart (NOVOLOG  FLEXPEN) 100 UNIT/ML FlexPen, Inject 14-20 Units into the skin 3 (three) times daily before meals. (Patient taking differently: Inject 18-24 Units into the skin 3 (three) times daily before meals.), Disp: 30 mL, Rfl: 1   Insulin  Pen Needle 32G X 4 MM MISC, 1 Device by Does not apply route daily. Use as directed to inject insulin  four times daily, Disp: 200 each, Rfl: 2   levothyroxine  (SYNTHROID ) 50 MCG tablet, TAKE 1 TABLET EVERY DAY, Disp: 90 tablet, Rfl: 1   linaclotide  (LINZESS ) 290 MCG CAPS capsule, Take 1 capsule (290 mcg total) by mouth daily before breakfast., Disp: 30 capsule, Rfl: 3   metoprolol  succinate (TOPROL -XL) 25 MG 24 hr tablet, Take 25 mg by mouth daily., Disp: , Rfl:    Multiple Vitamin (MULTIVITAMIN WITH MINERALS) TABS tablet, Take 1 tablet by mouth daily., Disp: 100 tablet, Rfl: 2   nitroGLYCERIN  (NITROSTAT ) 0.4 MG SL tablet, Place 1 tablet (0.4 mg total) under the tongue every 5 (five) minutes as needed for chest pain., Disp: 25 tablet, Rfl: 3   Omega-3 1000 MG CAPS, Take 1,000 mg by mouth daily., Disp: ,  Rfl:    tamsulosin  (FLOMAX ) 0.4 MG CAPS capsule, Take 1 capsule (0.4 mg total) by mouth daily after supper.,  Disp: 90 capsule, Rfl: 3   TOUJEO  MAX SOLOSTAR 300 UNIT/ML Solostar Pen, INJECT 60 UNITS INTO THE SKIN AT BEDTIME., Disp: 6 mL, Rfl: 0   traMADol  (ULTRAM ) 50 MG tablet, Take 50 mg by mouth 2 (two) times daily as needed., Disp: , Rfl:    zinc  sulfate 220 (50 Zn) MG capsule, Take 1 capsule (220 mg total) by mouth daily., Disp: 90 capsule, Rfl: 3 No current facility-administered medications for this visit.  Facility-Administered Medications Ordered in Other Visits:    0.9 %  sodium chloride  infusion, , Intravenous, Continuous, Paulett Boros, MD, Last Rate: 20 mL/hr at 12/30/18 1351, New Bag at 12/30/18 1351   0.9 %  sodium chloride  infusion, , Intravenous, Continuous, Paulett Boros, MD, Last Rate: 20 mL/hr at 12/30/18 1401, New Bag at 12/30/18 1401   Allergies: Allergies  Allergen Reactions   Demerol  [Meperidine  Hcl] Other (See Comments)    convulsions    REVIEW OF SYSTEMS:   Review of Systems  Constitutional:  Negative for chills, fatigue and fever.  HENT:   Negative for lump/mass, mouth sores, nosebleeds, sore throat and trouble swallowing.   Eyes:  Negative for eye problems.  Respiratory:  Positive for cough and shortness of breath.   Cardiovascular:  Negative for chest pain, leg swelling and palpitations.  Gastrointestinal:  Negative for abdominal pain, constipation, diarrhea, nausea and vomiting.  Genitourinary:  Negative for bladder incontinence, difficulty urinating, dysuria, frequency, hematuria and nocturia.   Musculoskeletal:  Positive for back pain (lower, 6/10 severity). Negative for arthralgias, flank pain, myalgias and neck pain.       +bilateral leg pain, 6/10 severity  Skin:  Negative for itching and rash.  Neurological:  Positive for numbness (and tingling in hands and feet). Negative for dizziness and headaches.  Hematological:  Does not bruise/bleed easily.  Psychiatric/Behavioral:  Negative for depression, sleep disturbance and suicidal ideas. The  patient is not nervous/anxious.   All other systems reviewed and are negative.    VITALS:   Blood pressure 131/64, pulse 73, temperature 98.2 F (36.8 C), temperature source Oral, resp. rate 16, weight 215 lb 9.8 oz (97.8 kg), SpO2 96%.  Wt Readings from Last 3 Encounters:  12/27/23 215 lb 9.8 oz (97.8 kg)  07/23/23 210 lb 1.6 oz (95.3 kg)  07/19/23 210 lb (95.3 kg)    Body mass index is 30.94 kg/m.  Performance status (ECOG): 1 - Symptomatic but completely ambulatory  PHYSICAL EXAM:   Physical Exam Vitals and nursing note reviewed. Exam conducted with a chaperone present.  Constitutional:      Appearance: Normal appearance.  Cardiovascular:     Rate and Rhythm: Normal rate and regular rhythm.     Pulses: Normal pulses.     Heart sounds: Normal heart sounds.  Pulmonary:     Effort: Pulmonary effort is normal.     Breath sounds: Normal breath sounds.  Abdominal:     Palpations: Abdomen is soft. There is no hepatomegaly, splenomegaly or mass.     Tenderness: There is no abdominal tenderness.  Musculoskeletal:     Right lower leg: No edema.     Left lower leg: No edema.  Lymphadenopathy:     Cervical: No cervical adenopathy.     Right cervical: No superficial, deep or posterior cervical adenopathy.    Left cervical: No superficial, deep or posterior cervical adenopathy.  Upper Body:     Right upper body: No supraclavicular or axillary adenopathy.     Left upper body: No supraclavicular or axillary adenopathy.  Neurological:     General: No focal deficit present.     Mental Status: He is alert and oriented to person, place, and time.  Psychiatric:        Mood and Affect: Mood normal.        Behavior: Behavior normal.     LABS:      Latest Ref Rng & Units 12/20/2023    9:08 AM 06/21/2023   12:53 PM 05/15/2023    4:03 AM  CBC  WBC 4.0 - 10.5 K/uL 2.3  4.5  2.5   Hemoglobin 13.0 - 17.0 g/dL 16.1  09.6  04.5   Hematocrit 39.0 - 52.0 % 33.5  34.3  27.8    Platelets 150 - 400 K/uL 82  108  76       Latest Ref Rng & Units 12/20/2023    9:08 AM 06/21/2023   12:53 PM 05/15/2023    4:03 AM  CMP  Glucose 70 - 99 mg/dL 409  811  914   BUN 8 - 23 mg/dL 19  17  16    Creatinine 0.61 - 1.24 mg/dL 7.82  9.56  2.13   Sodium 135 - 145 mmol/L 135  133  130   Potassium 3.5 - 5.1 mmol/L 4.3  4.3  3.8   Chloride 98 - 111 mmol/L 102  101  101   CO2 22 - 32 mmol/L 26  24  23    Calcium  8.9 - 10.3 mg/dL 8.9  9.2  7.8   Total Protein 6.5 - 8.1 g/dL 6.9  6.8  6.0   Total Bilirubin 0.0 - 1.2 mg/dL 0.9  1.6  1.3   Alkaline Phos 38 - 126 U/L 58  53  66   AST 15 - 41 U/L 18  22  63   ALT 0 - 44 U/L 17  23  137      No results found for: "CEA1", "CEA" / No results found for: "CEA1", "CEA" No results found for: "PSA1" Lab Results  Component Value Date   CAN199 10 12/20/2023   No results found for: "[id number]"  Lab Results  Component Value Date   TOTALPROTELP 6.6 06/21/2018   ALBUMINELP 3.9 06/21/2018   A1GS 0.3 06/21/2018   A2GS 0.6 06/21/2018   BETS 0.8 06/21/2018   GAMS 0.9 06/21/2018   MSPIKE Not Observed 06/21/2018   SPEI Comment 06/21/2018   Lab Results  Component Value Date   TIBC 264 07/04/2018   FERRITIN 325 10/31/2022   FERRITIN 263 10/30/2022   FERRITIN 191 07/04/2018   IRONPCTSAT 25 07/04/2018   Lab Results  Component Value Date   LDH 157 06/21/2018     STUDIES:   CT ABDOMEN PELVIS W CONTRAST Result Date: 12/26/2023 CLINICAL DATA:  Pancreatic cancer restaging. EXAM: CT ABDOMEN AND PELVIS WITH CONTRAST TECHNIQUE: Multidetector CT imaging of the abdomen and pelvis was performed using the standard protocol following bolus administration of intravenous contrast. RADIATION DOSE REDUCTION: This exam was performed according to the departmental dose-optimization program which includes automated exposure control, adjustment of the mA and/or kV according to patient size and/or use of iterative reconstruction technique. CONTRAST:   OMNIPAQUE  IOHEXOL  300 MG/ML  SOLN COMPARISON:  CT abdomen pelvis dated 06/21/2023. FINDINGS: Lower chest: The visualized lung bases are clear. There is coronary vascular calcification. No intra-abdominal free air  or free fluid. Hepatobiliary: The liver is unremarkable. No biliary dilatation. Small gallstone. No pericholecystic fluid or evidence of acute cholecystitis by CT. Pancreas: Postsurgical changes of distal pancreatectomy. Similar appearance of a small low attenuating lesion in the uncinate process of the pancreas, likely a side branch IPMN. Spleen: Splenectomy. Adrenals/Urinary Tract: The adrenal glands unremarkable. There is no hydronephrosis on either side. There is symmetric enhancement and excretion of contrast by both kidneys. The visualized ureters appear unremarkable. There is mild trabeculated appearance of the bladder wall, likely related to chronic bladder outlet obstruction. Stomach/Bowel: There is colonic diverticulosis. Postsurgical changes of Nissen fundoplication. There is no bowel obstruction or active inflammation. Appendectomy. Vascular/Lymphatic: Mild aortoiliac atherosclerotic disease. The IVC is unremarkable. No portal venous gas. No adenopathy. Reproductive: Mildly enlarged prostate gland measuring 6.4 cm in transverse axial diameter. Other: Several upper abdominal fat containing ventral hernias as seen previously. No bowel herniation or fluid collection. Musculoskeletal: Degenerative changes of the spine. No acute osseous pathology. IMPRESSION: 1. Stable exam.  No evidence of recurrent or metastatic disease. 2. Colonic diverticulosis. No bowel obstruction. 3. Cholelithiasis. 4.  Aortic Atherosclerosis (ICD10-I70.0). Electronically Signed   By: Angus Bark M.D.   On: 12/26/2023 12:09

## 2023-12-27 NOTE — Patient Instructions (Signed)
 [facility] at [facility] Discharge Instructions   You were seen and examined today by Dr. Cheree Cords.  He reviewed the results of your lab work which are mostly normal/stable. He your white blood cell count and platelets were low.   We will obtain additional blood work to investigate why your white cells and platelets dropped.   Return as scheduled.    Thank you for choosing  [facility] at [facility] to provide your oncology and hematology care.  To afford each patient quality time with our provider, please arrive at least 15 minutes before your scheduled appointment time.   If you have a lab appointment with the Cancer Center please come in thru the Main Entrance and check in at the main information desk.  You need to re-schedule your appointment should you arrive 10 or more minutes late.  We strive to give you quality time with our providers, and arriving late affects you and other patients whose appointments are after yours.  Also, if you no show three or more times for appointments you may be dismissed from the clinic at the providers discretion.     Again, thank you for choosing [facility].  Our hope is that these requests will decrease the amount of time that you wait before being seen by our physicians.       _____________________________________________________________  Should you have questions after your visit to [facility], please contact our office at [phone] and follow the prompts.  Our office hours are 8:00 a.m. and 4:30 p.m. Monday - Friday.  Please note that voicemails left after 4:00 p.m. may not be returned until the following business day.  We are closed weekends and major holidays.  You do have access to a nurse 24-7, just call the main number to the clinic [phone] and do not press any options, hold on the line and a nurse will answer the phone.    For prescription refill requests, have  your pharmacy contact our office and allow 72 hours.    Due to Covid, you will need to wear a mask upon entering the hospital. If you do not have a mask, a mask will be given to you at the Main Entrance upon arrival. For doctor visits, patients may have 1 support person age 50 or older with them. For treatment visits, patients can not have anyone with them due to social distancing guidelines and our immunocompromised population.

## 2023-12-28 LAB — KAPPA/LAMBDA LIGHT CHAINS
Kappa free light chain: 19.1 mg/L (ref 3.3–19.4)
Kappa, lambda light chain ratio: 1.11 (ref 0.26–1.65)
Lambda free light chains: 17.2 mg/L (ref 5.7–26.3)

## 2023-12-29 LAB — COPPER, SERUM: Copper: 136 ug/dL — ABNORMAL HIGH (ref 69–132)

## 2023-12-31 LAB — PROTEIN ELECTROPHORESIS, SERUM
A/G Ratio: 1.2 (ref 0.7–1.7)
Albumin ELP: 3.6 g/dL (ref 2.9–4.4)
Alpha-1-Globulin: 0.3 g/dL (ref 0.0–0.4)
Alpha-2-Globulin: 0.7 g/dL (ref 0.4–1.0)
Beta Globulin: 0.9 g/dL (ref 0.7–1.3)
Gamma Globulin: 1.1 g/dL (ref 0.4–1.8)
Globulin, Total: 3 g/dL (ref 2.2–3.9)
Total Protein ELP: 6.6 g/dL (ref 6.0–8.5)

## 2023-12-31 LAB — IMMUNOFIXATION ELECTROPHORESIS
IgA: 186 mg/dL (ref 61–437)
IgG (Immunoglobin G), Serum: 1086 mg/dL (ref 603–1613)
IgM (Immunoglobulin M), Srm: 73 mg/dL (ref 20–172)
Total Protein ELP: 6.7 g/dL (ref 6.0–8.5)

## 2024-01-03 LAB — METHYLMALONIC ACID, SERUM: Methylmalonic Acid, Quantitative: 106 nmol/L (ref 0–378)

## 2024-01-11 DIAGNOSIS — K432 Incisional hernia without obstruction or gangrene: Secondary | ICD-10-CM | POA: Diagnosis not present

## 2024-01-17 ENCOUNTER — Inpatient Hospital Stay: Attending: Hematology | Admitting: Hematology

## 2024-01-17 VITALS — BP 132/60 | HR 71 | Temp 97.4°F | Resp 18 | Wt 218.9 lb

## 2024-01-17 DIAGNOSIS — Z9221 Personal history of antineoplastic chemotherapy: Secondary | ICD-10-CM | POA: Diagnosis not present

## 2024-01-17 DIAGNOSIS — Z800 Family history of malignant neoplasm of digestive organs: Secondary | ICD-10-CM | POA: Insufficient documentation

## 2024-01-17 DIAGNOSIS — D61818 Other pancytopenia: Secondary | ICD-10-CM | POA: Insufficient documentation

## 2024-01-17 DIAGNOSIS — Z9081 Acquired absence of spleen: Secondary | ICD-10-CM | POA: Insufficient documentation

## 2024-01-17 DIAGNOSIS — Z8507 Personal history of malignant neoplasm of pancreas: Secondary | ICD-10-CM | POA: Insufficient documentation

## 2024-01-17 DIAGNOSIS — Z87891 Personal history of nicotine dependence: Secondary | ICD-10-CM | POA: Diagnosis not present

## 2024-01-17 DIAGNOSIS — Z90411 Acquired partial absence of pancreas: Secondary | ICD-10-CM | POA: Insufficient documentation

## 2024-01-17 DIAGNOSIS — D709 Neutropenia, unspecified: Secondary | ICD-10-CM | POA: Diagnosis not present

## 2024-01-17 DIAGNOSIS — D539 Nutritional anemia, unspecified: Secondary | ICD-10-CM | POA: Insufficient documentation

## 2024-01-17 DIAGNOSIS — D696 Thrombocytopenia, unspecified: Secondary | ICD-10-CM

## 2024-01-17 NOTE — Patient Instructions (Addendum)
 [facility] at [facility] Discharge Instructions   You were seen and examined today by Dr. Cheree Cords.  He reviewed the results of your lab work which are normal/stable.   Dr. Linnell Richardson wants to arrange for you to have a bone marrow biopsy to determine why your blood counts are low. We will see you back two weeks after the biopsy to review the results.   Return as scheduled.    Thank you for choosing [facility] at [facility] to provide your oncology and hematology care.  To afford each patient quality time with our provider, please arrive at least 15 minutes before your scheduled appointment time.   If you have a lab appointment with the Cancer Center please come in thru the Main Entrance and check in at the main information desk.  You need to re-schedule your appointment should you arrive 10 or more minutes late.  We strive to give you quality time with our providers, and arriving late affects you and other patients whose appointments are after yours.  Also, if you no show three or more times for appointments you may be dismissed from the clinic at the providers discretion.     Again, thank you for choosing [facility].  Our hope is that these requests will decrease the amount of time that you wait before being seen by our physicians.       _____________________________________________________________  Should you have questions after your visit to [facility], please contact our office at [phone] and follow the prompts.  Our office hours are 8:00 a.m. and 4:30 p.m. Monday - Friday.  Please note that voicemails left after 4:00 p.m. may not be returned until the following business day.  We are closed weekends and major holidays.  You do have access to a nurse 24-7, just call the main number to the clinic [phone] and do not press any options, hold on the line and a nurse will answer the phone.    For prescription refill  requests, have your pharmacy contact our office and allow 72 hours.    Due to Covid, you will need to wear a mask upon entering the hospital. If you do not have a mask, a mask will be given to you at the Main Entrance upon arrival. For doctor visits, patients may have 1 support person age 34 or older with them. For treatment visits, patients can not have anyone with them due to social distancing guidelines and our immunocompromised population.

## 2024-01-17 NOTE — Progress Notes (Signed)
 [facility] [address], [place] [address]    Clinic Day:  01/17/2024  Referring physician: Omie Bickers, MD  Patient Care Team: Omie Bickers, MD as PCP - General (Internal Medicine) Gerard Knight, MD as PCP - Cardiology (Cardiology) Paulett Boros, MD as Consulting Physician (Hematology and Oncology)   ASSESSMENT & PLAN:   Assessment: 1.  Stage IIb (T2N1) pancreatic adenocarcinoma: -Status post distal pancreatectomy and splenectomy at [facility] by Dr. Leighton Punches on 11/20/2018. 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 -12 cycles of FOLFIRINOX from 12/30/2018 through 06/04/2019. -MRI of the brain on 05/19/2019 did not show any abnormalities.  This was done because of new onset numbness in the left lower lip during therapy. -CT CAP on 03/24/2020 shows unchanged low-attenuation lesion of the pancreatic uncinate measuring 1.2 x 0.8 cm.  Unchanged low-attenuation lesion near the surgical margin of the pancreatic head and neck junction measuring 1.2 x 0.9 cm.  These may reflect postoperative seroma.  Unchanged prominent retroperitoneal lymph nodes.   2.  Post splenectomy state: -He received vaccination prior to splenectomy.    Plan: 1.  Stage IIb (T2N1) pancreatic adenocarcinoma: - Denies abdominal pains or GI symptoms like nausea/vomiting/diarrhea. - Previous labs showed normal LFTs and normal CA 19-9. - CTAP (12/20/2023): No evidence of recurrence. - Will repeat labs and CT scan in 6 months.  2.  Pancytopenia/macrocytic anemia: - We have reviewed blood work from 12/27/2023.  Ferritin, B12, folic acid , copper , MMA were normal.  Multiple myeloma panel was negative.  NGS myeloid panel is pending. - As we do not have a clear understanding of etiology of cytopenias, I have recommended further evaluation with bone marrow aspiration and biopsy.  Will obtain chromosome analysis and request reflex FISH testing for MDS/AML if the findings are consistent with them.        Orders Placed This Encounter  Procedures   CT BONE MARROW BIOPSY & ASPIRATION    Surgical pathology; cytogenetics; MDS FISH if appropriate    Standing Status:   Future    Expected Date:   01/24/2024    Expiration Date:   01/16/2025    Reason for Exam (SYMPTOM  OR DIAGNOSIS REQUIRED):   thrombocytopenia; neutropenia    Preferred location?:   [facility]     I,Helena R Teague,acting as a scribe for Paulett Boros, MD.,have documented all relevant documentation on the behalf of Paulett Boros, MD,as directed by  Paulett Boros, MD while in the presence of Paulett Boros, MD.  I, Paulett Boros MD, have reviewed the above documentation for accuracy and completeness, and I agree with the above.      Paulett Boros, MD   5/15/202510:20 AM  CHIEF COMPLAINT:   Diagnosis: pancreatic adenocarcinoma    Cancer Staging  No matching staging information was found for the patient.    Prior Therapy: 1. Distal pancreatectomy and splenectomy at [facility] on 11/20/2018. 2. Adjuvant FOLFIRINOX x 12 cycles from 12/30/2018 to 06/04/2019.  Current Therapy:  Surveillance    HISTORY OF PRESENT ILLNESS:   Oncology History  Pancreatic adenocarcinoma (HCC)  08/20/2018 Imaging   CT abdomen/pelvis w/ contrast: IMPRESSION: Atrophy and ductal dilatation involving the pancreatic tail, with suspected small soft tissue mass in the pancreatic body which could represent pancreatic carcinoma. Abdomen MRI and MRCP without and with contrast is recommended for further evaluation.   No evidence of hepatobiliary disease.   08/30/2018 Imaging   MRI abdomen w/ contrast: IMPRESSION: 1. Although not definitive, there  remains concern of a small hypoenhancing mass at the junction of the pancreatic body and tail associated with atrophy and ductal dilatation in the pancreatic tail. This remains concerning for pancreatic neoplasm. Postinflammatory stricture less likely.  Besides a tiny cystic lesion in the pancreatic tail, there are no other signs of previous pancreatitis. Further evaluation with endoscopic ultrasound for possible biopsy strongly recommended. 2. No evidence of metastatic disease. 3. Cholelithiasis without evidence of cholecystitis or biliary dilatation.   09/03/2018 Initial Diagnosis   Pancreatic adenocarcinoma (HCC)   10/03/2018 Procedure   EUS: - 2.6cm irregularly shaped mass in the body of the pancreas that causing main pancreatic duct obstruction and dilation. The mass abuts the splenic vessels but no other significant vascular structures and it was sampled with trangastric EUS FNA. Preliminary cytology is + for malignancy, likely well-differentiated adenocarcinoma. It appears surgically resectable.   10/03/2018 Pathology Results   Accession: [id number]  FINE NEEDLE ASPIRATION, ENDOSCOPIC, PANCREAS BODY (SPECIMEN 1 OF 1 COLLECTED 10/03/18): ADENOCARCINOMA.   10/17/2018 Imaging   PET: IMPRESSION: 1. Tiny focus of hypermetabolism identified in the body of pancreas, adjacent to the abrupt cut off of the main pancreatic duct. No evidence for hypermetabolic metastatic disease in the neck, chest, abdomen, or pelvis. 2. Cholelithiasis. 3.  Aortic Atherosclerois (ICD10-170.0) 4. 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  The Hereditary Gene Panel offered by [facility] includes sequencing and/or deletion duplication testing of the following 47 genes: APC, ATM, AXIN2, BARD1, BMPR1A, BRCA1, BRCA2, BRIP1, CDH1, CDK4, CDKN2A (p14ARF), CDKN2A (p16INK4a), CHEK2, CTNNA1, DICER1, EPCAM (Deletion/duplication testing only), GREM1 (promoter region deletion/duplication testing only), KIT, MEN1, MLH1, MSH2, MSH3, MSH6, MUTYH, NBN, NF1, NHTL1, PALB2, PDGFRA, PMS2, POLD1, POLE, PTEN, RAD50, RAD51C, RAD51D, SDHB, SDHC, SDHD, SMAD4, SMARCA4. STK11, TP53, TSC1, TSC2, and VHL.  The following genes  were evaluated for sequence changes only: SDHA and HOXB13 c.251G>A variant only. The report date is November 12, 2018.  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 The change in variant classification was made as a result of re-review of the evidence in light of new variant interpretation guidelines and/or new information. The amended report date is March 11, 2020.    11/20/2018 Surgery   Distal subtotal pancreatectomy with splenectomy at [facility]   11/20/2018 Surgery   TUMOR    Tumor Site:    Pancreatic body     Histologic Type:    Ductal adenocarcinoma     Histologic Grade:    G1: Well differentiated     Tumor Size:    Greatest dimension in Centimeters (cm): 2.8 Centimeters (cm)      Additional Dimension in Centimeters (cm):    2.7 Centimeters (cm)      Additional Dimension in Centimeters (cm):    1.8 Centimeters (cm)    Tumor Extent:          Tumor Extension:    Tumor is confined to pancreas     Accessory Findings:          Treatment Effect:    No known presurgical therapy       Lymphovascular Invasion:    Not identified       Perineural Invasion:    Not identified   MARGINS    Margins:          Proximal Pancreatic Parenchymal Margin:    Uninvolved by invasive carcinoma and pancreatic high-grade intraepithelial neoplasia  Distance of Invasive Carcinoma from Margin:    0.6 Centimeters (cm)    :          Other Margin:    Splenic margin.         Margin Status:    Uninvolved by invasive carcinoma   LYMPH NODES  Number of Lymph Nodes Involved:    2   Number of Lymph Nodes Examined:    16   PATHOLOGIC STAGE CLASSIFICATION  (pTNM, AJCC 8th Edition)  TNM Descriptors:    Not applicable   Primary Tumor (pT):    pT2   Regional Lymph Nodes (pN):    pN1   ADDITIONAL FINDINGS  Additional Pathologic Findings:    Pancreatic intraepithelial neoplasia     Highest Grade (PanIN):    High grade PanIN is present.    Additional Pathologic Findings:    Benign unilocular pancreatic cyst (1.3 cm in greatest dimension) at the pancreatic tail.   12/30/2018 - 06/06/2019 Chemotherapy   Patient is on Treatment Plan : PANCREAS FOLFIRINOX q14d        INTERVAL HISTORY:   Mark Foster is a 68 y.o. male presenting to clinic today for follow up of pancreatic adenocarcinoma. He was last seen by me on 12/27/23.  Today, he states that he is doing well overall. His appetite level is at 100%. His energy level is at 50%.  PAST MEDICAL HISTORY:   Past Medical History: Past Medical History:  Diagnosis Date   Bilateral carpal tunnel syndrome 10/19/2020   CAD (coronary artery disease)    a. 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   Type 2 diabetes mellitus (HCC)     Surgical History: Past Surgical History:  Procedure Laterality Date   APPENDECTOMY     BUBBLE STUDY N/A 11/18/2019   Procedure: BUBBLE STUDY;  Surgeon: Flavia Hughs, MD;  Location: [facility] ORS;  Service: Cardiology;  Laterality: N/A;   CARDIAC SURGERY     COLON RESECTION  1995   For diverticulitis   COLONOSCOPY WITH PROPOFOL  N/A 07/23/2023   Procedure: COLONOSCOPY WITH PROPOFOL ;  Surgeon: Vinetta Greening, DO;  Location: [facility] END[facility] SUITE;  Service: Endoscopy;  Laterality: N/A;  9:15 am, asa 3   CORONARY ARTERY BYPASS GRAFT N/A 01/26/2020   Procedure: CORONARY ARTERY BYPASS GRAFTING (CABG) TIMES FOUR USING LEFT INTERNAL MAMMARY VEIN AND RIGHT GREATER SAPHENOUS VEIN;  Surgeon: Zelphia Higashi, MD;  Location: [facility] OR;  Service: Open Heart Surgery;  Laterality: N/A;    ENDOVEIN HARVEST OF GREATER SAPHENOUS VEIN Right 01/26/2020   Procedure: Astrid Blamer Of Greater Saphenous Vein;  Surgeon: Zelphia Higashi, MD;  Location: [facility] OR;  Service: Open Heart Surgery;  Laterality: Right;   ESOPHAGOGASTRODUODENOSCOPY N/A 10/03/2018   Procedure: ESOPHAGOGASTRODUODENOSCOPY (EGD);  Surgeon: Janel Medford, MD;  Location: [facility];  Service: Endoscopy;  Laterality: N/A;   EUS N/A 10/03/2018   Procedure: UPPER ENDOSCOPIC ULTRASOUND (EUS) RADIAL;  Surgeon: Janel Medford, MD;  Location: [facility] ENDOSCOPY;  Service: Endoscopy;  Laterality: N/A;   EYE  SURGERY Bilateral    FINE NEEDLE ASPIRATION N/A 10/03/2018   Procedure: FINE NEEDLE ASPIRATION (FNA) LINEAR;  Surgeon: Janel Medford, MD;  Location: [facility] ENDOSCOPY;  Service: Endoscopy;  Laterality: N/A;   IR ANGIO INTRA EXTRACRAN SEL COM CAROTID INNOMINATE BILAT MOD SED  01/21/2018   IR ANGIO INTRA EXTRACRAN SEL COM CAROTID INNOMINATE UNI R MOD SED  03/21/2018   IR ANGIO VERTEBRAL SEL VERTEBRAL BILAT MOD SED  01/21/2018   IR TRANSCATH EXCRAN VERT OR CAR A STENT  03/21/2018   LEFT HEART CATH AND CORONARY ANGIOGRAPHY N/A 01/08/2020   Procedure: LEFT HEART CATH AND CORONARY ANGIOGRAPHY;  Surgeon: Arleen Lacer, MD;  Location: [facility] INVASIVE CV LAB;  Service: Cardiovascular;  Laterality: N/A;   POLYPECTOMY  07/23/2023   Procedure: POLYPECTOMY;  Surgeon: Vinetta Greening, DO;  Location: [facility];  Service: Endoscopy;;   PORT-A-CATH REMOVAL N/A 06/20/2021   Procedure: MINOR REMOVAL PORT-A-CATH;  Surgeon: Awilda Bogus, MD;  Location: [facility];  Service: General;  Laterality: N/A;   PORTACATH PLACEMENT Left 12/23/2018   Procedure: INSERTION PORT-A-CATH;  Surgeon: Awilda Bogus, MD;  Location: [facility];  Service: General;  Laterality: Left;   RADIOLOGY WITH ANESTHESIA N/A 03/21/2018   Procedure: IR WITH ANESTHESIA WITH STENT PLACEMENT;  Surgeon: Luellen Sages, MD;  Location: [facility] OR;  Service: Radiology;   Laterality: N/A;   ROTATOR CUFF REPAIR     Left   SPLENECTOMY     TEE WITHOUT CARDIOVERSION N/A 11/18/2019   Procedure: TRANSESOPHAGEAL ECHOCARDIOGRAM (TEE) WITH PROPOFOL ;  Surgeon: Flavia Hughs, MD;  Location: [facility];  Service: Cardiology;  Laterality: N/A;   TEE WITHOUT CARDIOVERSION N/A 01/26/2020   Procedure: TRANSESOPHAGEAL ECHOCARDIOGRAM (TEE);  Surgeon: Zelphia Higashi, MD;  Location[facility] OR;  Service: Open Heart Surgery;  Laterality: N/A;   TONSILLECTOMY      Social History: Social History   Socioeconomic History   Marital status: Married    Spouse name: Not on file   Number of children: 2   Years of education: Not on file   Highest education level: Not on file  Occupational History   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  Tobacco Use   Smoking status: Former    Current packs/day: 0.25    Average packs/day: 0.3 packs/day for 4.3 years (1.1 ttl pk-yrs)    Types: Cigarettes    Start date: 10/06/2019   Smokeless tobacco: Never  Vaping Use   Vaping status: Never Used  Substance and Sexual Activity   Alcohol use: Not Currently    Alcohol/week: 1.0 standard drink of alcohol    Types: 1 Cans of beer per week   Drug use: Never   Sexual activity: Not on file  Other Topics Concern   Not on file  Social History Narrative   Caffiene decaff (2-3 cups daily),  soda (dependent on blood glucose)   Married,  3 children - 4 grandchildren.      Social Drivers of Health   Financial Resource Strain: Medium Risk (12/18/2018)   Overall Financial Resource Strain (CARDIA)    Difficulty of Paying Living Expenses: Somewhat hard  Food Insecurity: No Food Insecurity (05/06/2023)   Hunger Vital Sign    Worried About Running Out of Food in the Last Year: Never true    Ran Out of Food in the Last Year: Never true  Transportation Needs: No Transportation Needs (05/06/2023)   PRAPARE - Transportation    Lack of Transportation (Medical): No    Lack of  Transportation (Non-Medical): No   Physical Activity: Inactive (12/18/2018)   Exercise Vital Sign    Days of Exercise per Week: 0 days    Minutes of Exercise per Session: 0 min  Stress: Stress Concern Present (12/18/2018)   [employer] of Occupational Health - Occupational Stress Questionnaire    Feeling of Stress : To some extent  Social Connections: Somewhat Isolated (12/18/2018)   Social Connection and Isolation Panel [NHANES]    Frequency of Communication with Friends and Family: More than three times a week    Frequency of Social Gatherings with Friends and Family: Once a week    Attends Religious Services: More than 4 times per year    Active Member of [employer] or Organizations: No    Attends Banker Meetings: Never    Marital Status: Divorced  Catering manager Violence: Not At Risk (05/06/2023)   Humiliation, Afraid, Rape, and Kick questionnaire    Fear of Current or Ex-Partner: No    Emotionally Abused: No    Physically Abused: No    Sexually Abused: No    Family History: Family History  Problem Relation Age of Onset   Stroke Mother    Pancreatic cancer Father 49       d. 66   Stroke Maternal Grandmother    Heart attack Maternal Grandfather    Heart Problems Paternal Grandfather    Scoliosis Daughter    Muscular dystrophy Grandson    Colon cancer Neg Hx     Current Medications:  Current Outpatient Medications:    albuterol  (VENTOLIN  HFA) 108 (90 Base) MCG/ACT inhaler, Inhale into the lungs., Disp: , Rfl:    aspirin  EC 81 MG tablet, Take 1 tablet (81 mg total) by mouth daily with breakfast., Disp: 90 tablet, Rfl: 3   atorvastatin  (LIPITOR ) 80 MG tablet, Take 1 tablet (80 mg total) by mouth daily at 6 PM. (Patient taking differently: Take 80 mg by mouth daily.), Disp: 90 tablet, Rfl: 1   Cholecalciferol  (VITAMIN D3) 25 MCG (1000 UT) capsule, Take 1,000 Units by mouth daily., Disp: , Rfl:    CINNAMON PO, Take by mouth., Disp: , Rfl:    cyanocobalamin  (VITAMIN B12) 1000 MCG tablet, Take 1  tablet (1,000 mcg total) by mouth daily., Disp: 90 tablet, Rfl: 3   ECHINACEA COMPLEX PO, Take 1,000 mg by mouth daily., Disp: , Rfl:    ezetimibe  (ZETIA ) 10 MG tablet, Take by mouth., Disp: , Rfl:    gabapentin  (NEURONTIN ) 300 MG capsule, Take 1 capsule (300 mg total) by mouth 3 (three) times daily., Disp: 270 capsule, Rfl: 3   hydrocortisone  (ANUSOL -HC) 2.5 % rectal cream, Place 1 Application rectally 2 (two) times daily. For 14 days, Disp: 30 g, Rfl: 1   insulin  aspart (NOVOLOG  FLEXPEN) 100 UNIT/ML FlexPen, Inject 14-20 Units into the skin 3 (three) times daily before meals. (Patient taking differently: Inject 18-24 Units into the skin 3 (three) times daily before meals.), Disp: 30 mL, Rfl: 1   Insulin  Pen Needle 32G X 4 MM MISC, 1 Device by Does not apply route daily. Use as directed to inject insulin  four times daily, Disp: 200 each, Rfl: 2   levothyroxine  (SYNTHROID ) 50 MCG tablet, TAKE 1 TABLET EVERY DAY, Disp: 90 tablet, Rfl: 1   linaclotide  (LINZESS ) 290 MCG CAPS capsule, Take 1 capsule (290 mcg total) by mouth daily before breakfast., Disp: 30 capsule, Rfl: 3   metoprolol  succinate (TOPROL -XL) 25 MG 24 hr tablet, Take 25 mg by mouth daily.,  Disp: , Rfl:    Multiple Vitamin (MULTIVITAMIN WITH MINERALS) TABS tablet, Take 1 tablet by mouth daily., Disp: 100 tablet, Rfl: 2   nitroGLYCERIN  (NITROSTAT ) 0.4 MG SL tablet, Place 1 tablet (0.4 mg total) under the tongue every 5 (five) minutes as needed for chest pain., Disp: 25 tablet, Rfl: 3   Omega-3 1000 MG CAPS, Take 1,000 mg by mouth daily., Disp: , Rfl:    tamsulosin  (FLOMAX ) 0.4 MG CAPS capsule, Take 1 capsule (0.4 mg total) by mouth daily after supper., Disp: 90 capsule, Rfl: 3   TOUJEO  MAX SOLOSTAR 300 UNIT/ML Solostar Pen, INJECT 60 UNITS INTO THE SKIN AT BEDTIME., Disp: 6 mL, Rfl: 0   traMADol  (ULTRAM ) 50 MG tablet, Take 50 mg by mouth 2 (two) times daily as needed., Disp: , Rfl:    zinc  sulfate 220 (50 Zn) MG capsule, Take 1 capsule (220  mg total) by mouth daily., Disp: 90 capsule, Rfl: 3 No current facility-administered medications for this visit.  Facility-Administered Medications Ordered in Other Visits:    0.9 %  sodium chloride  infusion, , Intravenous, Continuous, Paulett Boros, MD, Last Rate: 20 mL/hr at 12/30/18 1351, New Bag at 12/30/18 1351   0.9 %  sodium chloride  infusion, , Intravenous, Continuous, Paulett Boros, MD, Last Rate: 20 mL/hr at 12/30/18 1401, New Bag at 12/30/18 1401   Allergies: Allergies  Allergen Reactions   Demerol  [Meperidine  Hcl] Other (See Comments)    convulsions    REVIEW OF SYSTEMS:   Review of Systems  Constitutional:  Negative for chills, fatigue and fever.  HENT:   Negative for lump/mass, mouth sores, nosebleeds, sore throat and trouble swallowing.   Eyes:  Negative for eye problems.  Respiratory:  Positive for cough. Negative for shortness of breath.   Cardiovascular:  Negative for chest pain, leg swelling and palpitations.  Gastrointestinal:  Negative for abdominal pain, constipation, diarrhea, nausea and vomiting.  Genitourinary:  Negative for bladder incontinence, difficulty urinating, dysuria, frequency, hematuria and nocturia.   Musculoskeletal:  Negative for arthralgias, back pain, flank pain, myalgias and neck pain.  Skin:  Negative for itching and rash.  Neurological:  Positive for numbness. Negative for dizziness and headaches.  Hematological:  Does not bruise/bleed easily.  Psychiatric/Behavioral:  Negative for depression, sleep disturbance and suicidal ideas. The patient is not nervous/anxious.   All other systems reviewed and are negative.    VITALS:   Blood pressure 132/60, pulse 71, temperature (!) 97.4 F (36.3 C), temperature source Oral, resp. rate 18, weight 218 lb 14.7 oz (99.3 kg), SpO2 96%.  Wt Readings from Last 3 Encounters:  01/17/24 218 lb 14.7 oz (99.3 kg)  12/27/23 215 lb 9.8 oz (97.8 kg)  07/23/23 210 lb 1.6 oz (95.3 kg)    Body  mass index is 31.41 kg/m.  Performance status (ECOG): 1 - Symptomatic but completely ambulatory  PHYSICAL EXAM:   Physical Exam Vitals and nursing note reviewed. Exam conducted with a chaperone present.  Constitutional:      Appearance: Normal appearance.  Cardiovascular:     Rate and Rhythm: Normal rate and regular rhythm.     Pulses: Normal pulses.     Heart sounds: Normal heart sounds.  Pulmonary:     Effort: Pulmonary effort is normal.     Breath sounds: Normal breath sounds.  Abdominal:     Palpations: Abdomen is soft. There is no hepatomegaly, splenomegaly or mass.     Tenderness: There is no abdominal tenderness.  Musculoskeletal:  Right lower leg: No edema.     Left lower leg: No edema.  Lymphadenopathy:     Cervical: No cervical adenopathy.     Right cervical: No superficial, deep or posterior cervical adenopathy.    Left cervical: No superficial, deep or posterior cervical adenopathy.     Upper Body:     Right upper body: No supraclavicular or axillary adenopathy.     Left upper body: No supraclavicular or axillary adenopathy.  Neurological:     General: No focal deficit present.     Mental Status: He is alert and oriented to person, place, and time.  Psychiatric:        Mood and Affect: Mood normal.        Behavior: Behavior normal.     LABS:      Latest Ref Rng & Units 12/20/2023    9:08 AM 06/21/2023   12:53 PM 05/15/2023    4:03 AM  CBC  WBC 4.0 - 10.5 K/uL 2.3  4.5  2.5   Hemoglobin 13.0 - 17.0 g/dL 64.4  03.4  74.2   Hematocrit 39.0 - 52.0 % 33.5  34.3  27.8   Platelets 150 - 400 K/uL 82  108  76       Latest Ref Rng & Units 12/20/2023    9:08 AM 06/21/2023   12:53 PM 05/15/2023    4:03 AM  CMP  Glucose 70 - 99 mg/dL 595  638  756   BUN 8 - 23 mg/dL 19  17  16    Creatinine 0.61 - 1.24 mg/dL 4.33  2.95  1.88   Sodium 135 - 145 mmol/L 135  133  130   Potassium 3.5 - 5.1 mmol/L 4.3  4.3  3.8   Chloride 98 - 111 mmol/L 102  101  101   CO2 22  - 32 mmol/L 26  24  23    Calcium  8.9 - 10.3 mg/dL 8.9  9.2  7.8   Total Protein 6.5 - 8.1 g/dL 6.9  6.8  6.0   Total Bilirubin 0.0 - 1.2 mg/dL 0.9  1.6  1.3   Alkaline Phos 38 - 126 U/L 58  53  66   AST 15 - 41 U/L 18  22  63   ALT 0 - 44 U/L 17  23  137      No results found for: "CEA1", "CEA" / No results found for: "CEA1", "CEA" No results found for: "PSA1" Lab Results  Component Value Date   CAN199 10 12/20/2023   No results found for: "CZY606"  Lab Results  Component Value Date   TOTALPROTELP 6.7 12/27/2023   TOTALPROTELP 6.6 12/27/2023   ALBUMINELP 3.6 12/27/2023   A1GS 0.3 12/27/2023   A2GS 0.7 12/27/2023   BETS 0.9 12/27/2023   GAMS 1.1 12/27/2023   MSPIKE Not Observed 12/27/2023   SPEI Comment 12/27/2023   Lab Results  Component Value Date   TIBC 302 12/27/2023   TIBC 264 07/04/2018   FERRITIN 120 12/27/2023   FERRITIN 325 10/31/2022   FERRITIN 263 10/30/2022   IRONPCTSAT 40 (H) 12/27/2023   IRONPCTSAT 25 07/04/2018   Lab Results  Component Value Date   LDH 185 12/27/2023   LDH 157 06/21/2018     STUDIES:   CT ABDOMEN PELVIS W CONTRAST Result Date: 12/26/2023 CLINICAL DATA:  Pancreatic cancer restaging. EXAM: CT ABDOMEN AND PELVIS WITH CONTRAST TECHNIQUE: Multidetector CT imaging of the abdomen and pelvis was performed using the standard protocol following  bolus administration of intravenous contrast. RADIATION DOSE REDUCTION: This exam was performed according to the departmental dose-optimization program which includes automated exposure control, adjustment of the mA and/or kV according to patient size and/or use of iterative reconstruction technique. CONTRAST:  OMNIPAQUE  IOHEXOL  300 MG/ML  SOLN COMPARISON:  CT abdomen pelvis dated 06/21/2023. FINDINGS: Lower chest: The visualized lung bases are clear. There is coronary vascular calcification. No intra-abdominal free air or free fluid. Hepatobiliary: The liver is unremarkable. No biliary dilatation.  Small gallstone. No pericholecystic fluid or evidence of acute cholecystitis by CT. Pancreas: Postsurgical changes of distal pancreatectomy. Similar appearance of a small low attenuating lesion in the uncinate process of the pancreas, likely a side branch IPMN. Spleen: Splenectomy. Adrenals/Urinary Tract: The adrenal glands unremarkable. There is no hydronephrosis on either side. There is symmetric enhancement and excretion of contrast by both kidneys. The visualized ureters appear unremarkable. There is mild trabeculated appearance of the bladder wall, likely related to chronic bladder outlet obstruction. Stomach/Bowel: There is colonic diverticulosis. Postsurgical changes of Nissen fundoplication. There is no bowel obstruction or active inflammation. Appendectomy. Vascular/Lymphatic: Mild aortoiliac atherosclerotic disease. The IVC is unremarkable. No portal venous gas. No adenopathy. Reproductive: Mildly enlarged prostate gland measuring 6.4 cm in transverse axial diameter. Other: Several upper abdominal fat containing ventral hernias as seen previously. No bowel herniation or fluid collection. Musculoskeletal: Degenerative changes of the spine. No acute osseous pathology. IMPRESSION: 1. Stable exam.  No evidence of recurrent or metastatic disease. 2. Colonic diverticulosis. No bowel obstruction. 3. Cholelithiasis. 4.  Aortic Atherosclerosis (ICD10-I70.0). Electronically Signed   By: Angus Bark M.D.   On: 12/26/2023 12:09

## 2024-01-18 LAB — INTELLIGEN MYELOID

## 2024-02-11 ENCOUNTER — Other Ambulatory Visit: Payer: Self-pay | Admitting: Student

## 2024-02-11 ENCOUNTER — Other Ambulatory Visit: Payer: Self-pay

## 2024-02-11 DIAGNOSIS — D696 Thrombocytopenia, unspecified: Secondary | ICD-10-CM

## 2024-02-11 NOTE — H&P (Signed)
 Chief Complaint: Pancreatic cancer, pancytopenia/macrocytic anemia of uncertain etiology; referred for image guided bone marrow biopsy  Referring Provider(s): Katragadda,S  Supervising Physician: Zettie Hillock  Patient Status: [facility] - Out-pt  History of Present Illness: Mark Foster is a 68 y.o. male ex smoker with PMH sig for CAD with prior CABG, left carotid artery occlusion, spinal stenosis, DM/neuropathy, renal stones, diverticulitis with colon resection 1995, hypothyroidism, left VA stenosis, prior CVA, pancreatic cancer with prior distal pancreatectomy/splenectomy 2020 at [facility] who presents now with pancytopenia/macrocytic anemia of uncertain etiology. He is scheduled today for image guided bone marrow biopsy for further evaluation.   *** Patient is Full Code  Past Medical History:  Diagnosis Date   Bilateral carpal tunnel syndrome 10/19/2020   CAD (coronary artery disease)    a. 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   Type 2 diabetes mellitus (HCC)     Past Surgical History:  Procedure Laterality Date   APPENDECTOMY     BUBBLE STUDY N/A 11/18/2019   Procedure: BUBBLE STUDY;  Surgeon: Flavia Hughs, MD;  Location: [facility] ORS;  Service: Cardiology;  Laterality: N/A;   CARDIAC SURGERY     COLON RESECTION  1995   For diverticulitis   COLONOSCOPY WITH PROPOFOL  N/A 07/23/2023   Procedure: COLONOSCOPY WITH PROPOFOL ;  Surgeon: Vinetta Greening, DO;  Location: [facility] ENDO SUITE;  Service: Endoscopy;  Laterality: N/A;  9:15 am, asa 3   CORONARY ARTERY BYPASS GRAFT N/A 01/26/2020    Procedure: CORONARY ARTERY BYPASS GRAFTING (CABG) TIMES FOUR USING LEFT INTERNAL MAMMARY VEIN AND RIGHT GREATER SAPHENOUS VEIN;  Surgeon: Zelphia Higashi, MD;  Location: [facility] OR;  Service: Open Heart Surgery;  Laterality: N/A;   ENDOVEIN HARVEST OF GREATER SAPHENOUS VEIN Right 01/26/2020   Procedure: Astrid Blamer Of Greater Saphenous Vein;  Surgeon: Zelphia Higashi, MD;  Location: [facility] OR;  Service: Open Heart Surgery;  Laterality: Right;   ESOPHAGOGASTRODUODENOSCOPY N/A 10/03/2018   Procedure: ESOPHAGOGASTRODUODENOSCOPY (EGD);  Surgeon: Janel Medford, MD;  Location: [facility];  Service: Endoscopy;  Laterality: N/A;   EUS N/A 10/03/2018   Procedure: UPPER ENDOSCOPIC ULTRASOUND (EUS) RADIAL;  Surgeon: Janel Medford, MD;  Location: [facility] ENDOS[facility];  Service: Endoscopy;  Laterality: N/A;   EYE SURGERY Bilateral    FINE NEEDLE ASPIRATION N/A 10/03/2018   Procedure: FINE NEEDLE ASPIRATION (FNA) LINEAR;  Surgeon: Janel Medford, MD;  Location: [facility] ENDOSCOPY;  Service: Endoscopy;  Laterality: N/A;   IR ANGIO INTRA EXTRACRAN SEL COM CAROTID INNOMINATE BILAT MOD SED  01/21/2018   IR ANGIO INTRA EXTRACRAN SEL COM CAROTID INNOMINATE UNI R MOD SED  03/21/2018   IR ANGIO VERTEBRAL SEL VERTEBRAL BILAT MOD SED  01/21/2018   IR TRANSCATH EXCRAN VERT OR CAR A STENT  03/21/2018   LEFT HEART CATH AND CORONARY ANGIOGRAPHY N/A 01/08/2020   Procedure: LEFT HEART CATH AND CORONARY ANGIOGRAPHY;  Surgeon: Arleen Lacer, MD;  Location: [facility] INVASIVE CV LAB;  Service: Cardiovascular;  Laterality: N/A;   POLYPECTOMY  07/23/2023  Procedure: POLYPECTOMY;  Surgeon: Vinetta Greening, DO;  [facility];  Service: Endoscopy;;   PORT-A-CATH REMOVAL N/A 06/20/2021   Procedure: MINOR REMOVAL PORT-A-CATH;  Surgeon: Awilda Bogus, MD;  Location: [facility];  Service: General;  Laterality: N/A;   PORTACATH PLACEMENT Left 12/23/2018   Procedure: INSERTION PORT-A-CATH;  Surgeon: Awilda Bogus, MD;   Location: [facility];  Service: General;  Laterality: Left;   RADIOLOGY WITH ANESTHESIA N/A 03/21/2018   Procedure: IR WITH ANESTHESIA WITH STENT PLACEMENT;  Surgeon: Luellen Sages, MD;  Location: [facility] OR;  Service: Radiology;  Laterality: N/A;   ROTATOR CUFF REPAIR     Left   SPLENECTOMY     TEE WITHOUT CARDIOVERSION N/A 11/18/2019   Procedure: TRANSESOPHAGEAL ECHOCARDIOGRAM (TEE) WITH PROPOFOL ;  Surgeon: Flavia Hughs, MD;  Location: [facility];  Service: Cardiology;  Laterality: N/A;   TEE WITHOUT CARDIOVERSION N/A 01/26/2020   Procedure: TRANSESOPHAGEAL ECHOCARDIOGRAM (TEE);  Surgeon: Zelphia Higashi, MD;  Location: [facility] OR;  Service: Open Heart Surgery;  Laterality: N/A;   TONSILLECTOMY      Allergies: Demerol  [meperidine  hcl]  Medications: Prior to Admission medications   Medication Sig Start Date End Date Taking? Authorizing Provider  albuterol  (VENTOLIN  HFA) 108 (90 Base) MCG/ACT inhaler Inhale into the lungs. 11/09/22   [provider]  aspirin  EC 81 MG tablet Take 1 tablet (81 mg total) by mouth daily with breakfast. 10/31/22   Colin Dawley, MD  atorvastatin  (LIPITOR ) 80 MG tablet Take 1 tablet (80 mg total) by mouth daily at 6 PM. Patient taking differently: Take 80 mg by mouth daily. 05/03/20   Gosrani, Nimish C, MD  Cholecalciferol  (VITAMIN D3) 25 MCG (1000 UT) capsule Take 1,000 Units by mouth daily.    [provider]  CINNAMON PO Take by mouth.    [provider]  cyanocobalamin  (VITAMIN B12) 1000 MCG tablet Take 1 tablet (1,000 mcg total) by mouth daily. 10/31/22   Colin Dawley, MD  [facility] COMPLEX PO Take 1,000 mg by mouth daily.    [provider]  ezetimibe  (ZETIA ) 10 MG tablet Take by mouth. 10/18/21   [provider]  gabapentin  (NEURONTIN ) 300 MG capsule Take 1 capsule (300 mg total) by mouth 3 (three) times daily. 04/25/23   Johny Nap, NP  hydrocortisone  (ANUSOL -HC) 2.5 % rectal cream Place 1 Application  rectally 2 (two) times daily. For 14 days 05/28/23   Lanney Pitts, PA-C  insulin  aspart (NOVOLOG  FLEXPEN) 100 UNIT/ML FlexPen Inject 14-20 Units into the skin 3 (three) times daily before meals. Patient taking differently: Inject 18-24 Units into the skin 3 (three) times daily before meals. 05/12/21   Nida, Gebreselassie W, MD  Insulin  Pen Needle 32G X 4 MM MISC 1 Device by Does not apply route daily. Use as directed to inject insulin  four times daily 10/28/20   Nida, Gebreselassie W, MD  levothyroxine  (SYNTHROID ) 50 MCG tablet TAKE 1 TABLET EVERY DAY 08/14/20   Lenor Raddle, MD  linaclotide  (LINZESS ) 290 MCG CAPS capsule Take 1 capsule (290 mcg total) by mouth daily before breakfast. 05/28/23   Lanney Pitts, PA-C  metoprolol  succinate (TOPROL -XL) 25 MG 24 hr tablet Take 25 mg by mouth daily. 08/11/22   [provider]  Multiple Vitamin (MULTIVITAMIN WITH MINERALS) TABS tablet Take 1 tablet by mouth daily. 11/01/22   Colin Dawley, MD  nitroGLYCERIN  (NITROSTAT ) 0.4 MG SL tablet Place 1 tablet (0.4 mg total) under the tongue every 5 (five) minutes  as needed for chest pain. 10/31/21   Gerard Knight, MD  Omega-3 1000 MG CAPS Take 1,000 mg by mouth daily.    [provider]  tamsulosin  (FLOMAX ) 0.4 MG CAPS capsule Take 1 capsule (0.4 mg total) by mouth daily after supper. 10/31/22   Colin Dawley, MD  TOUJEO  MAX SOLOSTAR 300 UNIT/ML Solostar Pen INJECT 60 UNITS INTO THE SKIN AT BEDTIME. 08/22/21   Nida, Gebreselassie W, MD  traMADol  (ULTRAM ) 50 MG tablet Take 50 mg by mouth 2 (two) times daily as needed. 05/31/23   [provider]  zinc  sulfate 220 (50 Zn) MG capsule Take 1 capsule (220 mg total) by mouth daily. 11/01/22   Colin Dawley, MD     Family History  Problem Relation Age of Onset   Stroke Mother    Pancreatic cancer Father 14       d. 4   Stroke Maternal Grandmother    Heart attack Maternal Grandfather    Heart Problems Paternal Grandfather     Scoliosis Daughter    Muscular dystrophy Grandson    Colon cancer Neg Hx     Social History   Socioeconomic History   Marital status: Married    Spouse name: Not on file   Number of children: 2   Years of education: Not on file   Highest education level: Not on file  Occupational History   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  Tobacco Use   Smoking status: Former    Current packs/day: 0.25    Average packs/day: 0.3 packs/day for 4.4 years (1.1 ttl pk-yrs)    Types: Cigarettes    Start date: 10/06/2019   Smokeless tobacco: Never  Vaping Use   Vaping status: Never Used  Substance and Sexual Activity   Alcohol use: Not Currently    Alcohol/week: 1.0 standard drink of alcohol    Types: 1 Cans of beer per week   Drug use: Never   Sexual activity: Not on file  Other Topics Concern   Not on file  Social History Narrative   Caffiene decaff (2-3 cups daily),  soda (dependent on blood glucose)   Married,  3 children - 4 grandchildren.      Social Drivers of Health   Financial Resource Strain: Medium Risk (12/18/2018)   Overall Financial Resource Strain (CARDIA)    Difficulty of Paying Living Expenses: Somewhat hard  Food Insecurity: No Food Insecurity (05/06/2023)   Hunger Vital Sign    Worried About Running Out of Food in the Last Year: Never true    Ran Out of Food in the Last Year: Never true  Transportation Needs: No Transportation Needs (05/06/2023)   PRAPARE - Administrator, Civil Service (Medical): No    Lack of Transportation (Non-Medical): No  Physical Activity: Inactive (12/18/2018)   Exercise Vital Sign    Days of Exercise per Week: 0 days    Minutes of Exercise per Session: 0 min  Stress: Stress Concern Present (12/18/2018)   [employer] of Occupational Health - Occupational Stress Questionnaire    Feeling of Stress : To some extent  Social Connections: Somewhat Isolated (12/18/2018)   Social Connection and Isolation Panel [NHANES]    Frequency of  Communication with Friends and Family: More than three times a week    Frequency of Social Gatherings with Friends and Family: Once a week    Attends Religious Services: More than 4 times per year    Active Member of Clubs or Organizations: No  Attends Banker Meetings: Never    Marital Status: Divorced       Review of Systems  Vital Signs:   Advance Care Plan: no documents on file     Physical Exam  Imaging: No results found.  Labs:  CBC: Recent Labs    05/14/23 0412 05/15/23 0403 06/21/23 1253 12/20/23 0908  WBC 2.5* 2.5* 4.5 2.3*  HGB 11.8* 10.0* 11.9* 11.4*  HCT 33.6* 27.8* 34.3* 33.5*  PLT 56* 76* 108* 82*    COAGS: No results for input(s): "INR", "APTT" in the last 8760 hours.  BMP: Recent Labs    05/14/23 0412 05/15/23 0403 06/21/23 1253 12/20/23 0908  NA 131* 130* 133* 135  K 4.2 3.8 4.3 4.3  CL 98 101 101 102  CO2 22 23 24 26   GLUCOSE 179* 180* 286* 119*  BUN 14 16 17 19   CALCIUM  8.1* 7.8* 9.2 8.9  CREATININE 0.65 0.65 0.73 0.78  GFRNONAA >60 >60 >60 >60    LIVER FUNCTION TESTS: Recent Labs    05/14/23 0412 05/15/23 0403 06/21/23 1253 12/20/23 0908  BILITOT 1.4* 1.3* 1.6* 0.9  AST 89* 63* 22 18  ALT 148* 137* 23 17  ALKPHOS 75 66 53 58  PROT 6.0* 6.0* 6.8 6.9  ALBUMIN  2.9* 2.9* 4.2 3.6    TUMOR MARKERS: No results for input(s): "AFPTM", "CEA", "CA199", "CHROMGRNA" in the last 8760 hours.  Assessment and Plan: 68 y.o. male ex smoker with PMH sig for CAD with prior CABG, left carotid artery occlusion, spinal stenosis, DM/neuropathy, renal stones, diverticulitis with colon resection 1995, hypothyroidism, left VA stenosis, prior CVA, pancreatic cancer with prior distal pancreatectomy/splenectomy 2020 at [facility] who presents now with pancytopenia/macrocytic anemia of uncertain etiology. He is scheduled today for image guided bone marrow biopsy for further evaluation.Risks and benefits of procedure was discussed with the  patient  including, but not limited to bleeding, infection, damage to adjacent structures or low yield requiring additional tests.  All of the questions were answered and there is agreement to proceed.  Consent signed and in chart.    Thank you for allowing our service to participate in BRENDEN RUDMAN 's care.  Electronically Signed: D. Honore Lux, PA-C   02/11/2024, 12:27 PM     I spent a total of  20 minutes  in face to face in clinical consultation, greater than 50% of which was counseling/coordinating care for image guided bone marrow biopsy

## 2024-02-12 ENCOUNTER — Encounter (HOSPITAL_COMMUNITY): Payer: Self-pay

## 2024-02-12 ENCOUNTER — Ambulatory Visit (HOSPITAL_COMMUNITY)
Admission: RE | Admit: 2024-02-12 | Discharge: 2024-02-12 | Disposition: A | Discharge status: Home / Self Care | Source: Ambulatory Visit | Attending: Hematology | Admitting: Hematology

## 2024-02-12 DIAGNOSIS — Z87891 Personal history of nicotine dependence: Secondary | ICD-10-CM | POA: Diagnosis not present

## 2024-02-12 DIAGNOSIS — D61818 Other pancytopenia: Secondary | ICD-10-CM | POA: Insufficient documentation

## 2024-02-12 DIAGNOSIS — Z90411 Acquired partial absence of pancreas: Secondary | ICD-10-CM | POA: Diagnosis not present

## 2024-02-12 DIAGNOSIS — C9200 Acute myeloblastic leukemia, not having achieved remission: Secondary | ICD-10-CM | POA: Insufficient documentation

## 2024-02-12 DIAGNOSIS — D696 Thrombocytopenia, unspecified: Secondary | ICD-10-CM

## 2024-02-12 DIAGNOSIS — C259 Malignant neoplasm of pancreas, unspecified: Secondary | ICD-10-CM | POA: Diagnosis not present

## 2024-02-12 DIAGNOSIS — I2510 Atherosclerotic heart disease of native coronary artery without angina pectoris: Secondary | ICD-10-CM | POA: Insufficient documentation

## 2024-02-12 DIAGNOSIS — Z951 Presence of aortocoronary bypass graft: Secondary | ICD-10-CM | POA: Diagnosis not present

## 2024-02-12 DIAGNOSIS — Z9081 Acquired absence of spleen: Secondary | ICD-10-CM | POA: Diagnosis not present

## 2024-02-12 DIAGNOSIS — D709 Neutropenia, unspecified: Secondary | ICD-10-CM

## 2024-02-12 DIAGNOSIS — Z8673 Personal history of transient ischemic attack (TIA), and cerebral infarction without residual deficits: Secondary | ICD-10-CM | POA: Diagnosis not present

## 2024-02-12 DIAGNOSIS — D539 Nutritional anemia, unspecified: Secondary | ICD-10-CM | POA: Diagnosis not present

## 2024-02-12 LAB — CBC WITH DIFFERENTIAL/PLATELET
Abs Immature Granulocytes: 0 10*3/uL (ref 0.00–0.07)
Basophils Absolute: 0 10*3/uL (ref 0.0–0.1)
Basophils Relative: 1 %
Eosinophils Absolute: 0 10*3/uL (ref 0.0–0.5)
Eosinophils Relative: 1 %
HCT: 34 % — ABNORMAL LOW (ref 39.0–52.0)
Hemoglobin: 11.6 g/dL — ABNORMAL LOW (ref 13.0–17.0)
Immature Granulocytes: 0 %
Lymphocytes Relative: 77 %
Lymphs Abs: 1.6 10*3/uL (ref 0.7–4.0)
MCH: 36.1 pg — ABNORMAL HIGH (ref 26.0–34.0)
MCHC: 34.1 g/dL (ref 30.0–36.0)
MCV: 105.9 fL — ABNORMAL HIGH (ref 80.0–100.0)
Monocytes Absolute: 0.1 10*3/uL (ref 0.1–1.0)
Monocytes Relative: 3 %
Neutro Abs: 0.4 10*3/uL — CL (ref 1.7–7.7)
Neutrophils Relative %: 18 %
Platelets: 75 10*3/uL — ABNORMAL LOW (ref 150–400)
RBC: 3.21 MIL/uL — ABNORMAL LOW (ref 4.22–5.81)
RDW: 16 % — ABNORMAL HIGH (ref 11.5–15.5)
WBC: 2.1 10*3/uL — ABNORMAL LOW (ref 4.0–10.5)
nRBC: 0 % (ref 0.0–0.2)

## 2024-02-12 LAB — GLUCOSE, CAPILLARY: Glucose-Capillary: 217 mg/dL — ABNORMAL HIGH (ref 70–99)

## 2024-02-12 MED ORDER — SODIUM CHLORIDE 0.9 % IV SOLN: INTRAVENOUS | Status: DC

## 2024-02-12 MED ORDER — FENTANYL CITRATE (PF) 100 MCG/2ML IJ SOLN
INTRAMUSCULAR | Status: AC | PRN
  Administered 2024-02-12: 50 ug via INTRAVENOUS

## 2024-02-12 MED ORDER — FENTANYL CITRATE (PF) 100 MCG/2ML IJ SOLN
INTRAMUSCULAR | Status: AC
  Filled 2024-02-12: qty 2

## 2024-02-12 MED ORDER — MIDAZOLAM HCL 2 MG/2ML IJ SOLN
INTRAMUSCULAR | Status: AC | PRN
  Administered 2024-02-12: 1 mg via INTRAVENOUS

## 2024-02-12 MED ORDER — LIDOCAINE HCL 1 % IJ SOLN
INTRAMUSCULAR | Status: AC | PRN
  Administered 2024-02-12: 10 mL via INTRADERMAL

## 2024-02-12 MED ORDER — MIDAZOLAM HCL 2 MG/2ML IJ SOLN
INTRAMUSCULAR | Status: AC
  Filled 2024-02-12: qty 2

## 2024-02-12 NOTE — Procedures (Signed)
 Pre procedural Dx: Pancytopenia   Post procedural Dx: Same  Technically successful CT guided biopsy of right iliac bone marrow   EBL: None.   Complications: None immediate.   Mark Hillock, MD Pager #: [phone]

## 2024-02-12 NOTE — Discharge Instructions (Signed)
 Bone Marrow Aspiration and Bone Marrow Biopsy, Adult, Care After  The following information offers guidance on how to care for yourself after your procedure. Your health care provider may also give you more specific instructions. If you have problems or questions, contact your health care provider.  What can I expect after the procedure?  May remove dressing or bandaid and shower tomorrow.  Keep site clean and dry. Replace with clean dressing or bandaid as necessary. Urgent needs IR clinic [phone] (mon-fri 8-5).  After the procedure, it is common to have: Mild pain and tenderness. Swelling. Bruising. Follow these instructions at home: Incision care  Follow instructions from your health care provider about how to take care of the incision site. Make sure you: Wash your hands with soap and water for at least 20 seconds before and after you change your bandage (dressing). If soap and water are not available, use hand sanitizer. Change your dressing as told by your health care provider. Leave stitches (sutures), skin glue, or adhesive strips in place. These skin closures may need to stay in place for 2 weeks or longer. If adhesive strip edges start to loosen and curl up, you may trim the loose edges. Do not remove adhesive strips completely unless your health care provider tells you to do that. Check your incision site every day for signs of infection. Check for: More redness, swelling, or pain. Fluid or blood. Warmth. Pus or a bad smell. Activity Return to your normal activities as told by your health care provider. Ask your health care provider what activities are safe for you. Do not lift anything that is heavier than 10 lb (4.5 kg), or the limit that you are told, until your health care provider says that it is safe. If you were given a sedative during the procedure, it can affect you for several hours. Do not drive or operate machinery until your health care provider says that it is  safe. General instructions  Take over-the-counter and prescription medicines only as told by your health care provider. Do not take baths, swim, or use a hot tub until your health care provider approves. Ask your health care provider if you may take showers. You may only be allowed to take sponge baths. If directed, put ice on the affected area. To do this: Put ice in a plastic bag. Place a towel between your skin and the bag. Leave the ice on for 20 minutes, 2-3 times a day. If your skin turns bright red, remove the ice right away to prevent skin damage. The risk of skin damage is higher if you cannot feel pain, heat, or cold. Contact a health care provider if: You have signs of infection. Your pain is not controlled with medicine. You have cancer, and a temperature of 100.22F (38C) or higher. Get help right away if: You have a temperature of 101F (38.3C) or higher, or as told by your health care provider. You have bleeding from the incision site that cannot be controlled. This information is not intended to replace advice given to you by your health care provider. Make sure you discuss any questions you have with your health care provider. Document Revised: 12/26/2021 Document Reviewed: 12/26/2021 Elsevier Patient Education  2023 Elsevier Inc.     Moderate Conscious Sedation, Adult, Care After  This sheet gives you information about how to care for yourself after your procedure. Your health care provider may also give you more specific instructions. If you have problems or questions, contact  your health care provider. What can I expect after the procedure? After the procedure, it is common to have: Sleepiness for several hours. Impaired judgment for several hours. Difficulty with balance. Vomiting if you eat too soon. Follow these instructions at home: For the time period you were told by your health care provider:     Rest. Do not participate in activities where you  could fall or become injured. Do not drive or use machinery. Do not drink alcohol. Do not take sleeping pills or medicines that cause drowsiness. Do not make important decisions or sign legal documents. Do not take care of children on your own. Eating and drinking  Follow the diet recommended by your health care provider. Drink enough fluid to keep your urine pale yellow. If you vomit: Drink water, juice, or soup when you can drink without vomiting. Make sure you have little or no nausea before eating solid foods. General instructions Take over-the-counter and prescription medicines only as told by your health care provider. Have a responsible adult stay with you for the time you are told. It is important to have someone help care for you until you are awake and alert. Do not smoke. Keep all follow-up visits as told by your health care provider. This is important. Contact a health care provider if: You are still sleepy or having trouble with balance after 24 hours. You feel light-headed. You keep feeling nauseous or you keep vomiting. You develop a rash. You have a fever. You have redness or swelling around the IV site. Get help right away if: You have trouble breathing. You have new-onset confusion at home. Summary After the procedure, it is common to feel sleepy, have impaired judgment, or feel nauseous if you eat too soon. Rest after you get home. Know the things you should not do after the procedure. Follow the diet recommended by your health care provider and drink enough fluid to keep your urine pale yellow. Get help right away if you have trouble breathing or new-onset confusion at home. This information is not intended to replace advice given to you by your health care provider. Make sure you discuss any questions you have with your health care provider. Document Revised: 12/19/2019 Document Reviewed: 07/17/2019 Elsevier Patient Education  2023 [employer].

## 2024-02-14 LAB — SURGICAL PATHOLOGY

## 2024-02-18 ENCOUNTER — Inpatient Hospital Stay: Attending: Hematology | Admitting: Hematology

## 2024-02-18 VITALS — BP 137/80 | HR 70 | Temp 97.4°F | Resp 18 | Wt 219.4 lb

## 2024-02-18 DIAGNOSIS — C9200 Acute myeloblastic leukemia, not having achieved remission: Secondary | ICD-10-CM | POA: Diagnosis not present

## 2024-02-18 DIAGNOSIS — Z9221 Personal history of antineoplastic chemotherapy: Secondary | ICD-10-CM | POA: Diagnosis not present

## 2024-02-18 DIAGNOSIS — Z8507 Personal history of malignant neoplasm of pancreas: Secondary | ICD-10-CM | POA: Insufficient documentation

## 2024-02-18 DIAGNOSIS — Z800 Family history of malignant neoplasm of digestive organs: Secondary | ICD-10-CM | POA: Insufficient documentation

## 2024-02-18 DIAGNOSIS — Z9049 Acquired absence of other specified parts of digestive tract: Secondary | ICD-10-CM | POA: Insufficient documentation

## 2024-02-18 DIAGNOSIS — Z9081 Acquired absence of spleen: Secondary | ICD-10-CM | POA: Insufficient documentation

## 2024-02-18 DIAGNOSIS — Z87891 Personal history of nicotine dependence: Secondary | ICD-10-CM | POA: Diagnosis not present

## 2024-02-18 DIAGNOSIS — T451X5S Adverse effect of antineoplastic and immunosuppressive drugs, sequela: Secondary | ICD-10-CM | POA: Insufficient documentation

## 2024-02-18 NOTE — Patient Instructions (Addendum)
 [facility] Discharge Instructions   You were seen and examined today by Dr. Cheree Cords.  He reviewed the results of your bone marrow biopsy. 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  We will make a referral for you to [facility]. They have a leukemia expert there. We can take care of your here in collaboration with them if you decide to do treatment.   We will see you back after your visit with Dr. Ada Acres.   Return as scheduled.    Thank you for choosing [facility] at [facility] to provide your oncology and hematology care.  To afford each patient quality time with our provider, please arrive at least 15 minutes before your scheduled appointment time.   If you have a lab appointment with the Cancer Center please come in thru the Main Entrance and check in at the main information desk.  You need to re-schedule your appointment should you arrive 10 or more minutes late.  We strive to give you quality time with our providers, and arriving late affects you and other patients whose appointments are after yours.  Also, if you no show three or more times for appointments you may be dismissed from the clinic at the providers discretion.     Again, thank you for choosing [facility].  Our hope is that these requests will decrease the amount of time that you wait before being seen by our physicians.       _____________________________________________________________  Should you have questions after your visit to [facility] please contact our office at [phone] and follow the prompts.  Our office hours are 8:00 a.m. and 4:30 p.m. Monday - Friday.  Please note that voicemails left after 4:00 p.m. may not be returned until the following business day.  We are closed weekends and major holidays.  You do have access to a nurse 24-7, just call the main number to the clinic [phone] and do not press any options, hold on  the line and a nurse will answer the phone.    For prescription refill requests, have your pharmacy contact our office and allow 72 hours.    Due to Covid, you will need to wear a mask upon entering the hospital. If you do not have a mask, a mask will be given to you at the Main Entrance upon arrival. For doctor visits, patients may have 1 support person age 4 or older with them. For treatment visits, patients can not have anyone with them due to social distancing guidelines and our immunocompromised population.

## 2024-02-18 NOTE — Progress Notes (Signed)
 [facility] [address] [place] [address]    Clinic Day:  02/18/2024  Referring physician: Omie Bickers, MD  Patient Care Team: Omie Bickers, MD as PCP - General (Internal Medicine) Gerard Knight, MD as PCP - Cardiology (Cardiology) Paulett Boros, MD as Consulting Physician (Hematology and Oncology)   ASSESSMENT & PLAN:   Assessment: 1.  Stage IIb (T2N1) pancreatic adenocarcinoma: -Status post distal pancreatectomy and splenectomy at [facility] by Dr. Leighton Punches on 11/20/2018. 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 -12 cycles of FOLFIRINOX from 12/30/2018 through 06/04/2019. -MRI of the brain on 05/19/2019 did not show any abnormalities.  This was done because of new onset numbness in the left lower lip during therapy. -CT CAP on 03/24/2020 shows unchanged low-attenuation lesion of the pancreatic uncinate measuring 1.2 x 0.8 cm.  Unchanged low-attenuation lesion near the surgical margin of the pancreatic head and neck junction measuring 1.2 x 0.9 cm.  These may reflect postoperative seroma.  Unchanged prominent retroperitoneal lymph nodes.   2.  Post splenectomy state: -He received vaccination prior to splenectomy.    Plan:  1.  Therapy related AML: - As he had worsening cytopenias, we have done nutritional deficiency workup. - NGS myeloid panel was negative. - We reviewed bone marrow biopsy results from 16 2025: 20% blasts by aspirate differential, 7% myeloid blasts by flow cytometry (CD34, CD33, CD1 1 7, HLA-DR).  30-40% blasts by IHC on the biopsy. - He had received chemotherapy with oxaliplatin , irinotecan  and 5-FU in 2020 for his pancreatic cancer.  This may have contributed to his leukemia.  Cytogenetics is pending.  AML FISH panel is pending.  We have also requested NGS on bone marrow aspirate specimen. - I will make a referral to Dr. Ada Acres at [facility].  Patient is agreeable.  2.  Stage IIb (T2N1) pancreatic adenocarcinoma: - He  does not have any GI symptoms. - Recent CTAP on 12/20/2023 with no evidence of recurrence.  Previous labs also showed normal CA 19-9.      No orders of the defined types were placed in this encounter.    Nadeen Augusta Teague,acting as a Neurosurgeon for Paulett Boros, MD.,have documented all relevant documentation on the behalf of Paulett Boros, MD,as directed by  Paulett Boros, MD while in the presence of Paulett Boros, MD.  I, Paulett Boros MD, have reviewed the above documentation for accuracy and completeness, and I agree with the above.     Paulett Boros, MD   6/16/202510:55 AM  CHIEF COMPLAINT:   Diagnosis: pancreatic adenocarcinoma    Cancer Staging  No matching staging information was found for the patient.    Prior Therapy: 1. Distal pancreatectomy and splenectomy at [facility] on 11/20/2018. 2. Adjuvant FOLFIRINOX x 12 cycles from 12/30/2018 to 06/04/2019.  Current Therapy:  Surveillance    HISTORY OF PRESENT ILLNESS:   Oncology History  Pancreatic adenocarcinoma (HCC)  08/20/2018 Imaging   CT abdomen/pelvis w/ contrast: IMPRESSION: Atrophy and ductal dilatation involving the pancreatic tail, with suspected small soft tissue mass in the pancreatic body which could represent pancreatic carcinoma. Abdomen MRI and MRCP without and with contrast is recommended for further evaluation.   No evidence of hepatobiliary disease.   08/30/2018 Imaging   MRI abdomen w/ contrast: IMPRESSION: 1. Although not definitive, there remains concern of a small hypoenhancing mass at the junction of the pancreatic body and tail associated with atrophy and ductal dilatation in the pancreatic tail. This remains concerning for  pancreatic neoplasm. Postinflammatory stricture less likely. Besides a tiny cystic lesion in the pancreatic tail, there are no other signs of previous pancreatitis. Further evaluation with endoscopic ultrasound for possible biopsy  strongly recommended. 2. No evidence of metastatic disease. 3. Cholelithiasis without evidence of cholecystitis or biliary dilatation.   09/03/2018 Initial Diagnosis   Pancreatic adenocarcinoma (HCC)   10/03/2018 Procedure   EUS: - 2.6cm irregularly shaped mass in the body of the pancreas that causing main pancreatic duct obstruction and dilation. The mass abuts the splenic vessels but no other significant vascular structures and it was sampled with trangastric EUS FNA. Preliminary cytology is + for malignancy, likely well-differentiated adenocarcinoma. It appears surgically resectable.   10/03/2018 Pathology Results   Accession: [id number]  FINE NEEDLE ASPIRATION, ENDOSCOPIC, PANCREAS BODY (SPECIMEN 1 OF 1 COLLECTED 10/03/18): ADENOCARCINOMA.   10/17/2018 Imaging   PET: IMPRESSION: 1. Tiny focus of hypermetabolism identified in the body of pancreas, adjacent to the abrupt cut off of the main pancreatic duct. No evidence for hypermetabolic metastatic disease in the neck, chest, abdomen, or pelvis. 2. Cholelithiasis. 3.  Aortic Atherosclerois (ICD10-170.0) 4. 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  The Hereditary Gene Panel offered by [facility] includes sequencing and/or deletion duplication testing of the following 47 genes: APC, ATM, AXIN2, BARD1, BMPR1A, BRCA1, BRCA2, BRIP1, CDH1, CDK4, CDKN2A (p14ARF), CDKN2A (p16INK4a), CHEK2, CTNNA1, DICER1, EPCAM (Deletion/duplication testing only), GREM1 (promoter region deletion/duplication testing only), KIT, MEN1, MLH1, MSH2, MSH3, MSH6, MUTYH, NBN, NF1, NHTL1, PALB2, PDGFRA, PMS2, POLD1, POLE, PTEN, RAD50, RAD51C, RAD51D, SDHB, SDHC, SDHD, SMAD4, SMARCA4. STK11, TP53, TSC1, TSC2, and VHL.  The following genes were evaluated for sequence changes only: SDHA and HOXB13 c.251G>A variant only. The report date is November 12, 2018.  The variant of uncertain significance (VUS) in BRCA1 at  c.1259A>G (p.Asp420Gly) was reclassified to a benign variant. The change in variant classification was made as a result of re-review of the evidence in light of new variant interpretation guidelines and/or new information. The amended report date is March 11, 2020.    11/20/2018 Surgery   Distal subtotal pancreatectomy with splenectomy at [facility]   11/20/2018 Surgery   TUMOR    Tumor Site:    Pancreatic body     Histologic Type:    Ductal adenocarcinoma     Histologic Grade:    G1: Well differentiated     Tumor Size:    Greatest dimension in Centimeters (cm): 2.8 Centimeters (cm)      Additional Dimension in Centimeters (cm):    2.7 Centimeters (cm)      Additional Dimension in Centimeters (cm):    1.8 Centimeters (cm)    Tumor Extent:          Tumor Extension:    Tumor is confined to pancreas     Accessory Findings:          Treatment Effect:    No known presurgical therapy       Lymphovascular Invasion:    Not identified       Perineural Invasion:    Not identified   MARGINS    Margins:          Proximal Pancreatic Parenchymal Margin:    Uninvolved by invasive carcinoma and pancreatic high-grade intraepithelial neoplasia         Distance of Invasive Carcinoma from Margin:    0.6 Centimeters (cm)    :  Other Margin:    Splenic margin.         Margin Status:    Uninvolved by invasive carcinoma   LYMPH NODES  Number of Lymph Nodes Involved:    2   Number of Lymph Nodes Examined:    16   PATHOLOGIC STAGE CLASSIFICATION  (pTNM, AJCC 8th Edition)  TNM Descriptors:    Not applicable   Primary Tumor (pT):    pT2   Regional Lymph Nodes (pN):    pN1   ADDITIONAL FINDINGS  Additional Pathologic Findings:    Pancreatic intraepithelial neoplasia     Highest Grade (PanIN):    High grade PanIN is present.   Additional Pathologic Findings:    Benign unilocular pancreatic cyst (1.3 cm in greatest dimension) at the pancreatic tail.   12/30/2018 - 06/06/2019 Chemotherapy   Patient is on  Treatment Plan : PANCREAS FOLFIRINOX q14d        INTERVAL HISTORY:   Qadir is a 68 y.o. male presenting to clinic today for follow up of pancreatic adenocarcinoma. He was last seen by me on 01/17/24.  Since his last visit, he underwent bone marrow biopsy on 02/12/24. Cytometry of bone marrow showed: 7% CD34 positive myeloid blasts. Pathology of the bone marrow found acute myeloid leukemia.   Today, he states that he is doing well overall. His appetite level is at 75%. His energy level is at 50%.  PAST MEDICAL HISTORY:   Past Medical History: Past Medical History:  Diagnosis Date   Bilateral carpal tunnel syndrome 10/19/2020   CAD (coronary artery disease)    a. 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   Type 2 diabetes mellitus (HCC)     Surgical History: Past Surgical History:  Procedure Laterality Date   APPENDECTOMY     BUBBLE STUDY N/A 11/18/2019   Procedure: BUBBLE STUDY;  Surgeon: Flavia Hughs, MD;  Location: [facility] ORS;  Service: Cardiology;  Laterality: N/A;   CARDIAC SURGERY     COLON RESECTION  1995   For diverticulitis   COLONOSCOPY WITH PROPOFOL  N/A 07/23/2023   Procedure: COLONOSCOPY WITH PROPOFOL ;  Surgeon: Vinetta Greening, DO;  Location: [facility] END[facility] SUITE;  Service: Endoscopy;  Laterality: N/A;  9:15 am, asa 3   CORONARY ARTERY BYPASS GRAFT N/A 01/26/2020   Procedure: CORONARY ARTERY BYPASS GRAFTING (CABG) TIMES FOUR USING LEFT INTERNAL MAMMARY VEIN AND RIGHT GREATER SAPHENOUS VEIN;  Surgeon: Zelphia Higashi, MD;  Location: [facility] OR;  Service: Open Heart Surgery;   Laterality: N/A;   ENDOVEIN HARVEST OF GREATER SAPHENOUS VEIN Right 01/26/2020   Procedure: Astrid Blamer Of Greater Saphenous Vein;  Surgeon: Zelphia Higashi, MD;  Location: [facility] OR;  Service: Open Heart Surgery;  Laterality: Right;   ESOPHAGOGASTRODUODENOSCOPY N/A 10/03/2018   Procedure: ESOPHAGOGASTRODUODENOSCOPY (EGD);  Surgeon: Janel Medford, MD;  Location: [facility];  Service: Endoscopy;  Laterality: N/A;   EUS N/A 10/03/2018   Procedure: UPPER ENDOSCOPIC ULTRASOUND (EUS) RADIAL;  Surgeon: Janel Medford, MD;  Location: [facility] ENDOSCOPY;  Service:  Endoscopy;  Laterality: N/A;   EYE SURGERY Bilateral    FINE NEEDLE ASPIRATION N/A 10/03/2018   Procedure: FINE NEEDLE ASPIRATION (FNA) LINEAR;  Surgeon: Janel Medford, MD;  Location: [facility] ENDOSCOPY;  Service: Endoscopy;  Laterality: N/A;   IR ANGIO INTRA EXTRACRAN SEL COM CAROTID INNOMINATE BILAT MOD SED  01/21/2018   IR ANGIO INTRA EXTRACRAN SEL COM CAROTID INNOMINATE UNI R MOD SED  03/21/2018   IR ANGIO VERTEBRAL SEL VERTEBRAL BILAT MOD SED  01/21/2018   IR TRANSCATH EXCRAN VERT OR CAR A STENT  03/21/2018   LEFT HEART CATH AND CORONARY ANGIOGRAPHY N/A 01/08/2020   Procedure: LEFT HEART CATH AND CORONARY ANGIOGRAPHY;  Surgeon: Arleen Lacer, MD;  Location: [facility] INVASIVE CV LAB;  Service: Cardiovascular;  Laterality: N/A;   POLYPECTOMY  07/23/2023   Procedure: POLYPECTOMY;  Surgeon: Vinetta Greening, DO;  Location: [facility];  Service: Endoscopy;;   PORT-A-CATH REMOVAL N/A 06/20/2021   Procedure: MINOR REMOVAL PORT-A-CATH;  Surgeon: Awilda Bogus, MD;  Location: [facility];  Service: General;  Laterality: N/A;   PORTACATH PLACEMENT Left 12/23/2018   Procedure: INSERTION PORT-A-CATH;  Surgeon: Awilda Bogus, MD;  Location: [facility];  Service: General;  Laterality: Left;   RADIOLOGY WITH ANESTHESIA N/A 03/21/2018   Procedure: IR WITH ANESTHESIA WITH STENT PLACEMENT;  Surgeon: Luellen Sages, MD;  Location: [facility] OR;   Service: Radiology;  Laterality: N/A;   ROTATOR CUFF REPAIR     Left   SPLENECTOMY     TEE WITHOUT CARDIOVERSION N/A 11/18/2019   Procedure: TRANSESOPHAGEAL ECHOCARDIOGRAM (TEE) WITH PROPOFOL ;  Surgeon: Flavia Hughs, MD;  Location: [facility];  Service: Cardiology;  Laterality: N/A;   TEE WITHOUT CARDIOVERSION N/A 01/26/2020   Procedure: TRANSESOPHAGEAL ECHOCARDIOGRAM (TEE);  Surgeon: Zelphia Higashi, MD;  Location[facility] OR;  Service: Open Heart Surgery;  Laterality: N/A;   TONSILLECTOMY      Social History: Social History   Socioeconomic History   Marital status: Married    Spouse name: Not on file   Number of children: 2   Years of education: Not on file   Highest education level: Not on file  Occupational History   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  Tobacco Use   Smoking status: Former    Current packs/day: 0.25    Average packs/day: 0.2 packs/day for 4.4 years (1.1 ttl pk-yrs)    Types: Cigarettes    Start date: 10/06/2019   Smokeless tobacco: Never  Vaping Use   Vaping status: Never Used  Substance and Sexual Activity   Alcohol use: Not Currently    Alcohol/week: 1.0 standard drink of alcohol    Types: 1 Cans of beer per week   Drug use: Never   Sexual activity: Not on file  Other Topics Concern   Not on file  Social History Narrative   Caffiene decaff (2-3 cups daily),  soda (dependent on blood glucose)   Married,  3 children - 4 grandchildren.      Social Drivers of Health   Financial Resource Strain: Medium Risk (12/18/2018)   Overall Financial Resource Strain (CARDIA)    Difficulty of Paying Living Expenses: Somewhat hard  Food Insecurity: No Food Insecurity (05/06/2023)   Hunger Vital Sign    Worried About Running Out of Food in the Last Year: Never true    Ran Out of Food in the Last Year: Never true  Transportation Needs: No Transportation Needs (05/06/2023)   PRAPARE - Administrator, Civil Service (  Medical): No    Lack of Transportation  (Non-Medical): No  Physical Activity: Inactive (12/18/2018)   Exercise Vital Sign    Days of Exercise per Week: 0 days    Minutes of Exercise per Session: 0 min  Stress: Stress Concern Present (12/18/2018)   [employer] of Occupational Health - Occupational Stress Questionnaire    Feeling of Stress : To some extent  Social Connections: Somewhat Isolated (12/18/2018)   Social Connection and Isolation Panel    Frequency of Communication with Friends and Family: More than three times a week    Frequency of Social Gatherings with Friends and Family: Once a week    Attends Religious Services: More than 4 times per year    Active Member of [employer] or Organizations: No    Attends Banker Meetings: Never    Marital Status: Divorced  Catering manager Violence: Not At Risk (05/06/2023)   Humiliation, Afraid, Rape, and Kick questionnaire    Fear of Current or Ex-Partner: No    Emotionally Abused: No    Physically Abused: No    Sexually Abused: No    Family History: Family History  Problem Relation Age of Onset   Stroke Mother    Pancreatic cancer Father 61       d. 54   Stroke Maternal Grandmother    Heart attack Maternal Grandfather    Heart Problems Paternal Grandfather    Scoliosis Daughter    Muscular dystrophy Grandson    Colon cancer Neg Hx     Current Medications:  Current Outpatient Medications:    albuterol  (VENTOLIN  HFA) 108 (90 Base) MCG/ACT inhaler, Inhale into the lungs., Disp: , Rfl:    aspirin  EC 81 MG tablet, Take 1 tablet (81 mg total) by mouth daily with breakfast., Disp: 90 tablet, Rfl: 3   atorvastatin  (LIPITOR ) 80 MG tablet, Take 1 tablet (80 mg total) by mouth daily at 6 PM. (Patient taking differently: Take 80 mg by mouth daily.), Disp: 90 tablet, Rfl: 1   Cholecalciferol  (VITAMIN D3) 25 MCG (1000 UT) capsule, Take 1,000 Units by mouth daily., Disp: , Rfl:    CINNAMON PO, Take by mouth., Disp: , Rfl:    cyanocobalamin  (VITAMIN B12) 1000 MCG  tablet, Take 1 tablet (1,000 mcg total) by mouth daily., Disp: 90 tablet, Rfl: 3   ECHINACEA COMPLEX PO, Take 1,000 mg by mouth daily., Disp: , Rfl:    ezetimibe  (ZETIA ) 10 MG tablet, Take by mouth., Disp: , Rfl:    gabapentin  (NEURONTIN ) 300 MG capsule, Take 1 capsule (300 mg total) by mouth 3 (three) times daily., Disp: 270 capsule, Rfl: 3   hydrocortisone  (ANUSOL -HC) 2.5 % rectal cream, Place 1 Application rectally 2 (two) times daily. For 14 days, Disp: 30 g, Rfl: 1   insulin  aspart (NOVOLOG  FLEXPEN) 100 UNIT/ML FlexPen, Inject 14-20 Units into the skin 3 (three) times daily before meals. (Patient taking differently: Inject 18-24 Units into the skin 3 (three) times daily before meals.), Disp: 30 mL, Rfl: 1   Insulin  Pen Needle 32G X 4 MM MISC, 1 Device by Does not apply route daily. Use as directed to inject insulin  four times daily, Disp: 200 each, Rfl: 2   levothyroxine  (SYNTHROID ) 50 MCG tablet, TAKE 1 TABLET EVERY DAY, Disp: 90 tablet, Rfl: 1   linaclotide  (LINZESS ) 290 MCG CAPS capsule, Take 1 capsule (290 mcg total) by mouth daily before breakfast., Disp: 30 capsule, Rfl: 3   metoprolol  succinate (TOPROL -XL) 25 MG 24 hr tablet,  Take 25 mg by mouth daily., Disp: , Rfl:    Multiple Vitamin (MULTIVITAMIN WITH MINERALS) TABS tablet, Take 1 tablet by mouth daily., Disp: 100 tablet, Rfl: 2   nitroGLYCERIN  (NITROSTAT ) 0.4 MG SL tablet, Place 1 tablet (0.4 mg total) under the tongue every 5 (five) minutes as needed for chest pain., Disp: 25 tablet, Rfl: 3   Omega-3 1000 MG CAPS, Take 1,000 mg by mouth daily., Disp: , Rfl:    ONETOUCH VERIO test strip, 4 (four) times daily., Disp: , Rfl:    tamsulosin  (FLOMAX ) 0.4 MG CAPS capsule, Take 1 capsule (0.4 mg total) by mouth daily after supper., Disp: 90 capsule, Rfl: 3   TOUJEO  MAX SOLOSTAR 300 UNIT/ML Solostar Pen, INJECT 60 UNITS INTO THE SKIN AT BEDTIME., Disp: 6 mL, Rfl: 0   traMADol  (ULTRAM ) 50 MG tablet, Take 50 mg by mouth 2 (two) times daily as  needed., Disp: , Rfl:    zinc  sulfate 220 (50 Zn) MG capsule, Take 1 capsule (220 mg total) by mouth daily., Disp: 90 capsule, Rfl: 3 No current facility-administered medications for this visit.  Facility-Administered Medications Ordered in Other Visits:    0.9 %  sodium chloride  infusion, , Intravenous, Continuous, Paulett Boros, MD, Last Rate: 20 mL/hr at 12/30/18 1351, New Bag at 12/30/18 1351   0.9 %  sodium chloride  infusion, , Intravenous, Continuous, Paulett Boros, MD, Last Rate: 20 mL/hr at 12/30/18 1401, New Bag at 12/30/18 1401   Allergies: Allergies  Allergen Reactions   Demerol  [Meperidine  Hcl] Other (See Comments)    convulsions    REVIEW OF SYSTEMS:   Review of Systems  Constitutional:  Negative for chills, fatigue and fever.  HENT:   Negative for lump/mass, mouth sores, nosebleeds, sore throat and trouble swallowing.   Eyes:  Negative for eye problems.  Respiratory:  Positive for cough. Negative for shortness of breath.   Cardiovascular:  Negative for chest pain, leg swelling and palpitations.  Gastrointestinal:  Negative for abdominal pain, constipation, diarrhea, nausea and vomiting.  Genitourinary:  Negative for bladder incontinence, difficulty urinating, dysuria, frequency, hematuria and nocturia.   Musculoskeletal:  Negative for arthralgias, back pain, flank pain, myalgias and neck pain.  Skin:  Negative for itching and rash.  Neurological:  Positive for numbness. Negative for dizziness and headaches.  Hematological:  Does not bruise/bleed easily.  Psychiatric/Behavioral:  Negative for depression, sleep disturbance and suicidal ideas. The patient is not nervous/anxious.   All other systems reviewed and are negative.    VITALS:   Blood pressure 137/80, pulse 70, temperature (!) 97.4 F (36.3 C), temperature source Oral, resp. rate 18, weight 219 lb 5.7 oz (99.5 kg), SpO2 96%.  Wt Readings from Last 3 Encounters:  02/18/24 219 lb 5.7 oz (99.5 kg)   02/12/24 214 lb (97.1 kg)  01/17/24 218 lb 14.7 oz (99.3 kg)    Body mass index is 31.47 kg/m.  Performance status (ECOG): 1 - Symptomatic but completely ambulatory  PHYSICAL EXAM:   Physical Exam Vitals and nursing note reviewed. Exam conducted with a chaperone present.  Constitutional:      Appearance: Normal appearance.   Cardiovascular:     Rate and Rhythm: Normal rate and regular rhythm.     Pulses: Normal pulses.     Heart sounds: Normal heart sounds.  Pulmonary:     Effort: Pulmonary effort is normal.     Breath sounds: Normal breath sounds.  Abdominal:     Palpations: Abdomen is soft. There is no  hepatomegaly, splenomegaly or mass.     Tenderness: There is no abdominal tenderness.   Musculoskeletal:     Right lower leg: No edema.     Left lower leg: No edema.  Lymphadenopathy:     Cervical: No cervical adenopathy.     Right cervical: No superficial, deep or posterior cervical adenopathy.    Left cervical: No superficial, deep or posterior cervical adenopathy.     Upper Body:     Right upper body: No supraclavicular or axillary adenopathy.     Left upper body: No supraclavicular or axillary adenopathy.   Neurological:     General: No focal deficit present.     Mental Status: He is alert and oriented to person, place, and time.   Psychiatric:        Mood and Affect: Mood normal.        Behavior: Behavior normal.     LABS:      Latest Ref Rng & Units 02/12/2024    7:30 AM 12/20/2023    9:08 AM 06/21/2023   12:53 PM  CBC  WBC 4.0 - 10.5 K/uL 2.1  2.3  4.5   Hemoglobin 13.0 - 17.0 g/dL 91.4  78.2  95.6   Hematocrit 39.0 - 52.0 % 34.0  33.5  34.3   Platelets 150 - 400 K/uL 75  82  108       Latest Ref Rng & Units 12/20/2023    9:08 AM 06/21/2023   12:53 PM 05/15/2023    4:03 AM  CMP  Glucose 70 - 99 mg/dL 213  086  578   BUN 8 - 23 mg/dL 19  17  16    Creatinine 0.61 - 1.24 mg/dL 4.69  6.29  5.28   Sodium 135 - 145 mmol/L 135  133  130   Potassium  3.5 - 5.1 mmol/L 4.3  4.3  3.8   Chloride 98 - 111 mmol/L 102  101  101   CO2 22 - 32 mmol/L 26  24  23    Calcium  8.9 - 10.3 mg/dL 8.9  9.2  7.8   Total Protein 6.5 - 8.1 g/dL 6.9  6.8  6.0   Total Bilirubin 0.0 - 1.2 mg/dL 0.9  1.6  1.3   Alkaline Phos 38 - 126 U/L 58  53  66   AST 15 - 41 U/L 18  22  63   ALT 0 - 44 U/L 17  23  137      No results found for: CEA1, CEA / No results found for: CEA1, CEA No results found for: PSA1 Lab Results  Component Value Date   CAN199 10 12/20/2023   No results found for: UXL244  Lab Results  Component Value Date   TOTALPROTELP 6.7 12/27/2023   TOTALPROTELP 6.6 12/27/2023   ALBUMINELP 3.6 12/27/2023   A1GS 0.3 12/27/2023   A2GS 0.7 12/27/2023   BETS 0.9 12/27/2023   GAMS 1.1 12/27/2023   MSPIKE Not Observed 12/27/2023   SPEI Comment 12/27/2023   Lab Results  Component Value Date   TIBC 302 12/27/2023   TIBC 264 07/04/2018   FERRITIN 120 12/27/2023   FERRITIN 325 10/31/2022   FERRITIN 263 10/30/2022   IRONPCTSAT 40 (H) 12/27/2023   IRONPCTSAT 25 07/04/2018   Lab Results  Component Value Date   LDH 185 12/27/2023   LDH 157 06/21/2018     STUDIES:   CT BONE MARROW BIOPSY & ASPIRATION Result Date: 02/12/2024 CLINICAL DATA:  Thrombocytopenia and  neutropenia EXAM: CT-guided bone marrow biopsy TECHNIQUE: CT pelvis CONTRAST:  None RADIOPHARMACEUTICALS:  None FLUOROSCOPY: 83 mGy COMPARISON:  None FINDINGS: The patient was placed in prone position on the CT gantry. Radiopaque markers were placed on the patient's skin and initial imaging of the pelvis was performed. The patient's skin was then prepped and draped in the usual sterile fashion. Moderate sedation was provided for by the nursing staff under my supervision utilizing intravenous Versed  and fentanyl . The nurse had no other duties other than monitoring the patient and providing sedation during the procedure. I was present for the entire procedure. 1 mg of intravenous  Versed  and 100 mcg of intravenous fentanyl  were administered for a total of 25 minutes sedation time. 1% lidocaine  was used to infiltrate the skin at the access site prior to a stab incision. Local anesthesia was then used to infiltrate the region of soft tissue from the skin to the right iliac bone. The bone marrow needle was then advanced and imaging demonstrated the needle tip to be in the cortex of the right iliac bone. The bone was then penetrated and a sample was obtained. After the sample was evaluated, approximately 5 mL of heparinized bone marrow sample was obtained by aspiration. A core sample was then obtained. Multiple attempts at sampling was performed in order to get 2 1 cm segments. All needles were then removed from the patient. Sterile dressing was applied. IMPRESSION: Satisfactory core needle biopsy and aspiration of the right iliac bone marrow under CT guidance. Electronically Signed   By: Susan Ensign   On: 02/12/2024 11:14

## 2024-02-20 ENCOUNTER — Encounter (HOSPITAL_COMMUNITY): Payer: Self-pay | Admitting: Hematology

## 2024-02-21 DIAGNOSIS — C9200 Acute myeloblastic leukemia, not having achieved remission: Secondary | ICD-10-CM | POA: Diagnosis not present

## 2024-02-22 DIAGNOSIS — D709 Neutropenia, unspecified: Secondary | ICD-10-CM | POA: Diagnosis not present

## 2024-02-22 DIAGNOSIS — Z951 Presence of aortocoronary bypass graft: Secondary | ICD-10-CM | POA: Diagnosis not present

## 2024-02-22 DIAGNOSIS — C9201 Acute myeloblastic leukemia, in remission: Secondary | ICD-10-CM | POA: Diagnosis not present

## 2024-02-22 DIAGNOSIS — C9200 Acute myeloblastic leukemia, not having achieved remission: Secondary | ICD-10-CM | POA: Diagnosis not present

## 2024-02-25 ENCOUNTER — Encounter (HOSPITAL_COMMUNITY): Payer: Self-pay | Admitting: Hematology

## 2024-02-26 ENCOUNTER — Inpatient Hospital Stay: Admitting: Hematology

## 2024-02-28 ENCOUNTER — Encounter (HOSPITAL_COMMUNITY): Payer: Self-pay | Admitting: Hematology

## 2024-02-28 ENCOUNTER — Other Ambulatory Visit: Payer: Self-pay | Admitting: *Deleted

## 2024-02-28 DIAGNOSIS — T451X5S Adverse effect of antineoplastic and immunosuppressive drugs, sequela: Secondary | ICD-10-CM

## 2024-03-01 ENCOUNTER — Encounter (HOSPITAL_COMMUNITY): Payer: Self-pay | Admitting: Interventional Radiology

## 2024-03-03 ENCOUNTER — Inpatient Hospital Stay

## 2024-03-03 DIAGNOSIS — T451X5S Adverse effect of antineoplastic and immunosuppressive drugs, sequela: Secondary | ICD-10-CM

## 2024-03-03 DIAGNOSIS — Z8507 Personal history of malignant neoplasm of pancreas: Secondary | ICD-10-CM | POA: Diagnosis not present

## 2024-03-03 DIAGNOSIS — C9200 Acute myeloblastic leukemia, not having achieved remission: Secondary | ICD-10-CM

## 2024-03-03 LAB — CBC WITH DIFFERENTIAL/PLATELET
Abs Immature Granulocytes: 0 10*3/uL (ref 0.00–0.07)
Basophils Absolute: 0 10*3/uL (ref 0.0–0.1)
Basophils Relative: 0 %
Eosinophils Absolute: 0 10*3/uL (ref 0.0–0.5)
Eosinophils Relative: 0 %
HCT: 35 % — ABNORMAL LOW (ref 39.0–52.0)
Hemoglobin: 12.2 g/dL — ABNORMAL LOW (ref 13.0–17.0)
Lymphocytes Relative: 90 %
Lymphs Abs: 1.9 10*3/uL (ref 0.7–4.0)
MCH: 36.6 pg — ABNORMAL HIGH (ref 26.0–34.0)
MCHC: 34.9 g/dL (ref 30.0–36.0)
MCV: 105.1 fL — ABNORMAL HIGH (ref 80.0–100.0)
Monocytes Absolute: 0 10*3/uL — ABNORMAL LOW (ref 0.1–1.0)
Monocytes Relative: 1 %
Neutro Abs: 0.2 10*3/uL — CL (ref 1.7–7.7)
Neutrophils Relative %: 9 %
Platelets: 72 10*3/uL — ABNORMAL LOW (ref 150–400)
RBC: 3.33 MIL/uL — ABNORMAL LOW (ref 4.22–5.81)
RDW: 16.1 % — ABNORMAL HIGH (ref 11.5–15.5)
WBC: 2.1 10*3/uL — ABNORMAL LOW (ref 4.0–10.5)
nRBC: 0 % (ref 0.0–0.2)

## 2024-03-03 LAB — COMPREHENSIVE METABOLIC PANEL WITH GFR
ALT: 19 U/L (ref 0–44)
AST: 18 U/L (ref 15–41)
Albumin: 3.7 g/dL (ref 3.5–5.0)
Alkaline Phosphatase: 69 U/L (ref 38–126)
Anion gap: 10 (ref 5–15)
BUN: 14 mg/dL (ref 8–23)
CO2: 24 mmol/L (ref 22–32)
Calcium: 8.8 mg/dL — ABNORMAL LOW (ref 8.9–10.3)
Chloride: 102 mmol/L (ref 98–111)
Creatinine, Ser: 0.88 mg/dL (ref 0.61–1.24)
GFR, Estimated: >60 mL/min (ref >60)
Glucose, Bld: 168 mg/dL — ABNORMAL HIGH (ref 70–99)
Potassium: 4.2 mmol/L (ref 3.5–5.1)
Sodium: 136 mmol/L (ref 135–145)
Total Bilirubin: 1 mg/dL (ref 0.0–1.2)
Total Protein: 6.8 g/dL (ref 6.5–8.1)

## 2024-03-03 LAB — MAGNESIUM: Magnesium: 2 mg/dL (ref 1.7–2.4)

## 2024-03-03 NOTE — Progress Notes (Signed)
 CRITICAL VALUE ALERT Critical value received:  ANC 0.2 Date of notification:  03-03-2024 Time of notification: 09:56 am.  Critical value read back:  Yes.   Nurse who received alert:  B.Nerida Boivin RN.  MD notified time and response:  Dr. Rogers @10 :23 am.

## 2024-03-10 DIAGNOSIS — Z951 Presence of aortocoronary bypass graft: Secondary | ICD-10-CM | POA: Diagnosis not present

## 2024-03-10 DIAGNOSIS — C9200 Acute myeloblastic leukemia, not having achieved remission: Secondary | ICD-10-CM | POA: Diagnosis not present

## 2024-03-10 DIAGNOSIS — Z9081 Acquired absence of spleen: Secondary | ICD-10-CM | POA: Diagnosis not present

## 2024-03-10 DIAGNOSIS — I081 Rheumatic disorders of both mitral and tricuspid valves: Secondary | ICD-10-CM | POA: Diagnosis not present

## 2024-03-10 DIAGNOSIS — E1140 Type 2 diabetes mellitus with diabetic neuropathy, unspecified: Secondary | ICD-10-CM | POA: Diagnosis not present

## 2024-03-10 DIAGNOSIS — C251 Malignant neoplasm of body of pancreas: Secondary | ICD-10-CM | POA: Diagnosis not present

## 2024-03-14 DIAGNOSIS — Y95 Nosocomial condition: Secondary | ICD-10-CM | POA: Diagnosis not present

## 2024-03-14 DIAGNOSIS — D61810 Antineoplastic chemotherapy induced pancytopenia: Secondary | ICD-10-CM | POA: Diagnosis not present

## 2024-03-14 DIAGNOSIS — R0902 Hypoxemia: Secondary | ICD-10-CM | POA: Diagnosis not present

## 2024-03-14 DIAGNOSIS — E1142 Type 2 diabetes mellitus with diabetic polyneuropathy: Secondary | ICD-10-CM | POA: Diagnosis not present

## 2024-03-14 DIAGNOSIS — E1165 Type 2 diabetes mellitus with hyperglycemia: Secondary | ICD-10-CM | POA: Diagnosis not present

## 2024-03-14 DIAGNOSIS — R7401 Elevation of levels of liver transaminase levels: Secondary | ICD-10-CM | POA: Diagnosis not present

## 2024-03-14 DIAGNOSIS — T451X5S Adverse effect of antineoplastic and immunosuppressive drugs, sequela: Secondary | ICD-10-CM | POA: Diagnosis not present

## 2024-03-14 DIAGNOSIS — R079 Chest pain, unspecified: Secondary | ICD-10-CM | POA: Diagnosis not present

## 2024-03-14 DIAGNOSIS — K8689 Other specified diseases of pancreas: Secondary | ICD-10-CM | POA: Diagnosis not present

## 2024-03-14 DIAGNOSIS — K719 Toxic liver disease, unspecified: Secondary | ICD-10-CM | POA: Diagnosis not present

## 2024-03-14 DIAGNOSIS — D709 Neutropenia, unspecified: Secondary | ICD-10-CM | POA: Diagnosis not present

## 2024-03-14 DIAGNOSIS — J189 Pneumonia, unspecified organism: Secondary | ICD-10-CM | POA: Diagnosis not present

## 2024-03-14 DIAGNOSIS — E669 Obesity, unspecified: Secondary | ICD-10-CM | POA: Diagnosis not present

## 2024-03-14 DIAGNOSIS — E1169 Type 2 diabetes mellitus with other specified complication: Secondary | ICD-10-CM | POA: Diagnosis not present

## 2024-03-14 DIAGNOSIS — E8770 Fluid overload, unspecified: Secondary | ICD-10-CM | POA: Diagnosis not present

## 2024-03-14 DIAGNOSIS — C9200 Acute myeloblastic leukemia, not having achieved remission: Secondary | ICD-10-CM | POA: Diagnosis not present

## 2024-03-14 DIAGNOSIS — R5081 Fever presenting with conditions classified elsewhere: Secondary | ICD-10-CM | POA: Diagnosis not present

## 2024-03-14 DIAGNOSIS — R7402 Elevation of levels of lactic acid dehydrogenase (LDH): Secondary | ICD-10-CM | POA: Diagnosis not present

## 2024-03-14 DIAGNOSIS — T451X5A Adverse effect of antineoplastic and immunosuppressive drugs, initial encounter: Secondary | ICD-10-CM | POA: Diagnosis not present

## 2024-03-15 DIAGNOSIS — D709 Neutropenia, unspecified: Secondary | ICD-10-CM | POA: Diagnosis not present

## 2024-03-19 DIAGNOSIS — R509 Fever, unspecified: Secondary | ICD-10-CM | POA: Diagnosis not present

## 2024-03-20 DIAGNOSIS — R188 Other ascites: Secondary | ICD-10-CM | POA: Diagnosis not present

## 2024-03-20 DIAGNOSIS — R4182 Altered mental status, unspecified: Secondary | ICD-10-CM | POA: Diagnosis not present

## 2024-03-20 DIAGNOSIS — Z951 Presence of aortocoronary bypass graft: Secondary | ICD-10-CM | POA: Diagnosis not present

## 2024-03-20 DIAGNOSIS — J810 Acute pulmonary edema: Secondary | ICD-10-CM | POA: Diagnosis not present

## 2024-03-20 DIAGNOSIS — J90 Pleural effusion, not elsewhere classified: Secondary | ICD-10-CM | POA: Diagnosis not present

## 2024-03-20 DIAGNOSIS — I517 Cardiomegaly: Secondary | ICD-10-CM | POA: Diagnosis not present

## 2024-03-23 DIAGNOSIS — J90 Pleural effusion, not elsewhere classified: Secondary | ICD-10-CM | POA: Diagnosis not present

## 2024-03-24 DIAGNOSIS — J3489 Other specified disorders of nose and nasal sinuses: Secondary | ICD-10-CM | POA: Diagnosis not present

## 2024-03-24 DIAGNOSIS — J90 Pleural effusion, not elsewhere classified: Secondary | ICD-10-CM | POA: Diagnosis not present

## 2024-03-25 DIAGNOSIS — I517 Cardiomegaly: Secondary | ICD-10-CM | POA: Diagnosis not present

## 2024-03-26 DIAGNOSIS — R5081 Fever presenting with conditions classified elsewhere: Secondary | ICD-10-CM | POA: Diagnosis not present

## 2024-03-26 DIAGNOSIS — D709 Neutropenia, unspecified: Secondary | ICD-10-CM | POA: Diagnosis not present

## 2024-03-26 DIAGNOSIS — R0602 Shortness of breath: Secondary | ICD-10-CM | POA: Diagnosis not present

## 2024-03-26 DIAGNOSIS — T451X5S Adverse effect of antineoplastic and immunosuppressive drugs, sequela: Secondary | ICD-10-CM | POA: Diagnosis not present

## 2024-03-26 DIAGNOSIS — C9200 Acute myeloblastic leukemia, not having achieved remission: Secondary | ICD-10-CM | POA: Diagnosis not present

## 2024-03-26 DIAGNOSIS — E8770 Fluid overload, unspecified: Secondary | ICD-10-CM | POA: Diagnosis not present

## 2024-03-26 DIAGNOSIS — R0902 Hypoxemia: Secondary | ICD-10-CM | POA: Diagnosis not present

## 2024-04-01 DIAGNOSIS — K8020 Calculus of gallbladder without cholecystitis without obstruction: Secondary | ICD-10-CM | POA: Diagnosis not present

## 2024-04-07 DIAGNOSIS — J90 Pleural effusion, not elsewhere classified: Secondary | ICD-10-CM | POA: Diagnosis not present

## 2024-04-08 ENCOUNTER — Ambulatory Visit: Admitting: Adult Health

## 2024-04-10 DIAGNOSIS — R918 Other nonspecific abnormal finding of lung field: Secondary | ICD-10-CM | POA: Diagnosis not present

## 2024-04-10 DIAGNOSIS — R1084 Generalized abdominal pain: Secondary | ICD-10-CM | POA: Diagnosis not present

## 2024-04-18 DIAGNOSIS — D630 Anemia in neoplastic disease: Secondary | ICD-10-CM | POA: Diagnosis not present

## 2024-04-18 DIAGNOSIS — E785 Hyperlipidemia, unspecified: Secondary | ICD-10-CM | POA: Diagnosis not present

## 2024-04-18 DIAGNOSIS — C9200 Acute myeloblastic leukemia, not having achieved remission: Secondary | ICD-10-CM | POA: Diagnosis not present

## 2024-04-18 DIAGNOSIS — I10 Essential (primary) hypertension: Secondary | ICD-10-CM | POA: Diagnosis not present

## 2024-04-18 DIAGNOSIS — Z8673 Personal history of transient ischemic attack (TIA), and cerebral infarction without residual deficits: Secondary | ICD-10-CM | POA: Diagnosis not present

## 2024-04-18 DIAGNOSIS — Z6830 Body mass index (BMI) 30.0-30.9, adult: Secondary | ICD-10-CM | POA: Diagnosis not present

## 2024-04-18 DIAGNOSIS — E669 Obesity, unspecified: Secondary | ICD-10-CM | POA: Diagnosis not present

## 2024-04-18 DIAGNOSIS — J9811 Atelectasis: Secondary | ICD-10-CM | POA: Diagnosis not present

## 2024-04-18 DIAGNOSIS — E119 Type 2 diabetes mellitus without complications: Secondary | ICD-10-CM | POA: Diagnosis not present

## 2024-04-18 DIAGNOSIS — J90 Pleural effusion, not elsewhere classified: Secondary | ICD-10-CM | POA: Diagnosis not present

## 2024-04-18 DIAGNOSIS — Z794 Long term (current) use of insulin: Secondary | ICD-10-CM | POA: Diagnosis not present

## 2024-04-18 DIAGNOSIS — Z720 Tobacco use: Secondary | ICD-10-CM | POA: Diagnosis not present

## 2024-04-18 DIAGNOSIS — D61818 Other pancytopenia: Secondary | ICD-10-CM | POA: Diagnosis not present

## 2024-04-18 DIAGNOSIS — N400 Enlarged prostate without lower urinary tract symptoms: Secondary | ICD-10-CM | POA: Diagnosis not present

## 2024-04-18 DIAGNOSIS — Z951 Presence of aortocoronary bypass graft: Secondary | ICD-10-CM | POA: Diagnosis not present

## 2024-04-18 DIAGNOSIS — E039 Hypothyroidism, unspecified: Secondary | ICD-10-CM | POA: Diagnosis not present

## 2024-04-18 DIAGNOSIS — D696 Thrombocytopenia, unspecified: Secondary | ICD-10-CM | POA: Diagnosis not present

## 2024-04-18 DIAGNOSIS — I2510 Atherosclerotic heart disease of native coronary artery without angina pectoris: Secondary | ICD-10-CM | POA: Diagnosis not present

## 2024-04-18 DIAGNOSIS — Z9181 History of falling: Secondary | ICD-10-CM | POA: Diagnosis not present

## 2024-04-18 DIAGNOSIS — K8020 Calculus of gallbladder without cholecystitis without obstruction: Secondary | ICD-10-CM | POA: Diagnosis not present

## 2024-04-18 DIAGNOSIS — Z8507 Personal history of malignant neoplasm of pancreas: Secondary | ICD-10-CM | POA: Diagnosis not present

## 2024-04-22 DIAGNOSIS — C9200 Acute myeloblastic leukemia, not having achieved remission: Secondary | ICD-10-CM | POA: Diagnosis not present

## 2024-04-22 DIAGNOSIS — J189 Pneumonia, unspecified organism: Secondary | ICD-10-CM | POA: Diagnosis not present

## 2024-04-22 DIAGNOSIS — Z79899 Other long term (current) drug therapy: Secondary | ICD-10-CM | POA: Diagnosis not present

## 2024-04-22 DIAGNOSIS — C259 Malignant neoplasm of pancreas, unspecified: Secondary | ICD-10-CM | POA: Diagnosis not present

## 2024-04-22 DIAGNOSIS — T451X5S Adverse effect of antineoplastic and immunosuppressive drugs, sequela: Secondary | ICD-10-CM | POA: Diagnosis not present

## 2024-04-23 DIAGNOSIS — M6281 Muscle weakness (generalized): Secondary | ICD-10-CM | POA: Diagnosis not present

## 2024-04-23 DIAGNOSIS — C9201 Acute myeloblastic leukemia, in remission: Secondary | ICD-10-CM | POA: Diagnosis not present

## 2024-04-23 DIAGNOSIS — E8771 Transfusion associated circulatory overload: Secondary | ICD-10-CM | POA: Diagnosis not present

## 2024-04-23 DIAGNOSIS — Z6829 Body mass index (BMI) 29.0-29.9, adult: Secondary | ICD-10-CM | POA: Diagnosis not present

## 2024-04-23 DIAGNOSIS — M545 Low back pain, unspecified: Secondary | ICD-10-CM | POA: Diagnosis not present

## 2024-04-23 DIAGNOSIS — E118 Type 2 diabetes mellitus with unspecified complications: Secondary | ICD-10-CM | POA: Diagnosis not present

## 2024-04-23 DIAGNOSIS — E1165 Type 2 diabetes mellitus with hyperglycemia: Secondary | ICD-10-CM | POA: Diagnosis not present

## 2024-04-23 DIAGNOSIS — F439 Reaction to severe stress, unspecified: Secondary | ICD-10-CM | POA: Diagnosis not present

## 2024-04-23 DIAGNOSIS — R9389 Abnormal findings on diagnostic imaging of other specified body structures: Secondary | ICD-10-CM | POA: Diagnosis not present

## 2024-04-23 DIAGNOSIS — E782 Mixed hyperlipidemia: Secondary | ICD-10-CM | POA: Diagnosis not present

## 2024-04-23 DIAGNOSIS — M533 Sacrococcygeal disorders, not elsewhere classified: Secondary | ICD-10-CM | POA: Diagnosis not present

## 2024-04-23 DIAGNOSIS — Z713 Dietary counseling and surveillance: Secondary | ICD-10-CM | POA: Diagnosis not present

## 2024-04-28 ENCOUNTER — Ambulatory Visit: Payer: PPO | Admitting: Adult Health

## 2024-04-30 ENCOUNTER — Other Ambulatory Visit: Payer: Self-pay | Admitting: *Deleted

## 2024-04-30 DIAGNOSIS — T451X5S Adverse effect of antineoplastic and immunosuppressive drugs, sequela: Secondary | ICD-10-CM

## 2024-05-13 DIAGNOSIS — T451X5S Adverse effect of antineoplastic and immunosuppressive drugs, sequela: Secondary | ICD-10-CM | POA: Diagnosis not present

## 2024-05-13 DIAGNOSIS — C9200 Acute myeloblastic leukemia, not having achieved remission: Secondary | ICD-10-CM | POA: Diagnosis not present

## 2024-05-13 DIAGNOSIS — I361 Nonrheumatic tricuspid (valve) insufficiency: Secondary | ICD-10-CM | POA: Diagnosis not present

## 2024-05-13 DIAGNOSIS — I517 Cardiomegaly: Secondary | ICD-10-CM | POA: Diagnosis not present

## 2024-05-13 DIAGNOSIS — I071 Rheumatic tricuspid insufficiency: Secondary | ICD-10-CM | POA: Diagnosis not present

## 2024-05-13 DIAGNOSIS — I371 Nonrheumatic pulmonary valve insufficiency: Secondary | ICD-10-CM | POA: Diagnosis not present

## 2024-05-13 DIAGNOSIS — C259 Malignant neoplasm of pancreas, unspecified: Secondary | ICD-10-CM | POA: Diagnosis not present

## 2024-05-13 DIAGNOSIS — R918 Other nonspecific abnormal finding of lung field: Secondary | ICD-10-CM | POA: Diagnosis not present

## 2024-05-15 DIAGNOSIS — Z79899 Other long term (current) drug therapy: Secondary | ICD-10-CM | POA: Diagnosis not present

## 2024-05-15 DIAGNOSIS — C9200 Acute myeloblastic leukemia, not having achieved remission: Secondary | ICD-10-CM | POA: Diagnosis not present

## 2024-05-15 DIAGNOSIS — T451X5S Adverse effect of antineoplastic and immunosuppressive drugs, sequela: Secondary | ICD-10-CM | POA: Diagnosis not present

## 2024-05-15 DIAGNOSIS — Z5111 Encounter for antineoplastic chemotherapy: Secondary | ICD-10-CM | POA: Diagnosis not present

## 2024-05-16 DIAGNOSIS — Z5111 Encounter for antineoplastic chemotherapy: Secondary | ICD-10-CM | POA: Diagnosis not present

## 2024-05-16 DIAGNOSIS — Z79899 Other long term (current) drug therapy: Secondary | ICD-10-CM | POA: Diagnosis not present

## 2024-05-16 DIAGNOSIS — C9200 Acute myeloblastic leukemia, not having achieved remission: Secondary | ICD-10-CM | POA: Diagnosis not present

## 2024-05-16 DIAGNOSIS — T451X5S Adverse effect of antineoplastic and immunosuppressive drugs, sequela: Secondary | ICD-10-CM | POA: Diagnosis not present

## 2024-05-19 ENCOUNTER — Inpatient Hospital Stay

## 2024-05-20 ENCOUNTER — Inpatient Hospital Stay: Attending: Hematology

## 2024-05-20 DIAGNOSIS — Z8507 Personal history of malignant neoplasm of pancreas: Secondary | ICD-10-CM | POA: Insufficient documentation

## 2024-05-20 DIAGNOSIS — C9200 Acute myeloblastic leukemia, not having achieved remission: Secondary | ICD-10-CM | POA: Insufficient documentation

## 2024-05-20 LAB — COMPREHENSIVE METABOLIC PANEL WITH GFR
ALT: 15 U/L (ref 0–44)
AST: 23 U/L (ref 15–41)
Albumin: 3.4 g/dL — ABNORMAL LOW (ref 3.5–5.0)
Alkaline Phosphatase: 158 U/L — ABNORMAL HIGH (ref 38–126)
Anion gap: 13 (ref 5–15)
BUN: 15 mg/dL (ref 8–23)
CO2: 22 mmol/L (ref 22–32)
Calcium: 8.5 mg/dL — ABNORMAL LOW (ref 8.9–10.3)
Chloride: 97 mmol/L — ABNORMAL LOW (ref 98–111)
Creatinine, Ser: 0.94 mg/dL (ref 0.61–1.24)
GFR, Estimated: >60 mL/min (ref >60)
Glucose, Bld: 375 mg/dL — ABNORMAL HIGH (ref 70–99)
Potassium: 3.5 mmol/L (ref 3.5–5.1)
Sodium: 132 mmol/L — ABNORMAL LOW (ref 135–145)
Total Bilirubin: 1.7 mg/dL — ABNORMAL HIGH (ref 0.0–1.2)
Total Protein: 6.5 g/dL (ref 6.5–8.1)

## 2024-05-20 LAB — CBC WITH DIFFERENTIAL/PLATELET
Abs Immature Granulocytes: 0.1 K/uL — ABNORMAL HIGH (ref 0.00–0.07)
Band Neutrophils: 1 %
Basophils Absolute: 0.1 K/uL (ref 0.0–0.1)
Basophils Relative: 1 %
Eosinophils Absolute: 0.1 K/uL (ref 0.0–0.5)
Eosinophils Relative: 1 %
HCT: 32.8 % — ABNORMAL LOW (ref 39.0–52.0)
Hemoglobin: 11.1 g/dL — ABNORMAL LOW (ref 13.0–17.0)
Lymphocytes Relative: 7 %
Lymphs Abs: 1 K/uL (ref 0.7–4.0)
MCH: 33.2 pg (ref 26.0–34.0)
MCHC: 33.8 g/dL (ref 30.0–36.0)
MCV: 98.2 fL (ref 80.0–100.0)
Metamyelocytes Relative: 1 %
Monocytes Absolute: 0 K/uL — ABNORMAL LOW (ref 0.1–1.0)
Monocytes Relative: 0 %
Neutro Abs: 13.1 K/uL — ABNORMAL HIGH (ref 1.7–7.7)
Neutrophils Relative %: 89 %
Platelets: 52 K/uL — ABNORMAL LOW (ref 150–400)
RBC: 3.34 MIL/uL — ABNORMAL LOW (ref 4.22–5.81)
RDW: 17.7 % — ABNORMAL HIGH (ref 11.5–15.5)
Smear Review: NORMAL
WBC: 14.6 K/uL — ABNORMAL HIGH (ref 4.0–10.5)
nRBC: 0 % (ref 0.0–0.2)

## 2024-05-20 LAB — MAGNESIUM: Magnesium: 1.8 mg/dL (ref 1.7–2.4)

## 2024-05-20 NOTE — Progress Notes (Signed)
 Patients double port flushed without difficulty.  Good blood return noted from both ports with no bruising or swelling noted at site.  Labs drawn per orders.  VSS with discharge and left in satisfactory condition with no s/s of distress noted. All follow ups as scheduled.       Mark Foster

## 2024-05-21 DIAGNOSIS — L299 Pruritus, unspecified: Secondary | ICD-10-CM | POA: Diagnosis not present

## 2024-05-21 DIAGNOSIS — Z6830 Body mass index (BMI) 30.0-30.9, adult: Secondary | ICD-10-CM | POA: Diagnosis not present

## 2024-05-21 DIAGNOSIS — T451X5A Adverse effect of antineoplastic and immunosuppressive drugs, initial encounter: Secondary | ICD-10-CM | POA: Diagnosis not present

## 2024-05-21 DIAGNOSIS — R110 Nausea: Secondary | ICD-10-CM | POA: Diagnosis not present

## 2024-05-21 DIAGNOSIS — Z9989 Dependence on other enabling machines and devices: Secondary | ICD-10-CM | POA: Diagnosis not present

## 2024-05-21 DIAGNOSIS — Z713 Dietary counseling and surveillance: Secondary | ICD-10-CM | POA: Diagnosis not present

## 2024-05-21 DIAGNOSIS — C9500 Acute leukemia of unspecified cell type not having achieved remission: Secondary | ICD-10-CM | POA: Diagnosis not present

## 2024-05-21 DIAGNOSIS — Z794 Long term (current) use of insulin: Secondary | ICD-10-CM | POA: Diagnosis not present

## 2024-05-21 DIAGNOSIS — E1165 Type 2 diabetes mellitus with hyperglycemia: Secondary | ICD-10-CM | POA: Diagnosis not present

## 2024-05-22 ENCOUNTER — Other Ambulatory Visit

## 2024-05-27 DIAGNOSIS — Z79899 Other long term (current) drug therapy: Secondary | ICD-10-CM | POA: Diagnosis not present

## 2024-05-27 DIAGNOSIS — C9200 Acute myeloblastic leukemia, not having achieved remission: Secondary | ICD-10-CM | POA: Diagnosis not present

## 2024-05-27 DIAGNOSIS — T451X5S Adverse effect of antineoplastic and immunosuppressive drugs, sequela: Secondary | ICD-10-CM | POA: Diagnosis not present

## 2024-05-27 DIAGNOSIS — Z5111 Encounter for antineoplastic chemotherapy: Secondary | ICD-10-CM | POA: Diagnosis not present

## 2024-05-27 NOTE — Telephone Encounter (Signed)
 Received direct call from [facility] with [employer]. Calling for clarification on ingredients of MMW.   Call back # [phone]    Per Lauraine Harman, NP in  secure chat:  Contains 1 part Maalox Plus extra strength - 1 part diphenhydramine  - 1 part nystatin. Solution is then mixed 1:1 with viscous lidocaine  2%   30 mL contains 5 mL of Maalox Plus extra strength (400 mg aluminum hydroxide, 400 mg magnesium  hydroxide, 40 mg simethicone ), 12.5 mg diphenhydramine , 500,000 units nystatin, 300 mg lidocaine      Called [address] and spoke to [place]. Relayed Lauraine Paradise, NP message. Verbalizes understanding and expresses appreciation.

## 2024-05-27 NOTE — Progress Notes (Signed)
 Post 2 units platelets and 1 L IVFs patient stated that he felt a little better. Vital signs stable. Treatment team made aware. Lauraine Harman, NP came to chairside, and she advised ok for patient to discharge. Patient discharged alert, oriented, and ambulatory with cane, accompanied by spouse.

## 2024-05-27 NOTE — Telephone Encounter (Signed)
 Thanks so much Olivia - .Electronically signed by: Lauraine Casimir Harman, NP 05/27/2024 4:48 PM

## 2024-05-29 ENCOUNTER — Inpatient Hospital Stay

## 2024-05-29 DIAGNOSIS — C9200 Acute myeloblastic leukemia, not having achieved remission: Secondary | ICD-10-CM | POA: Diagnosis not present

## 2024-05-29 DIAGNOSIS — T451X5S Adverse effect of antineoplastic and immunosuppressive drugs, sequela: Secondary | ICD-10-CM

## 2024-05-29 LAB — COMPREHENSIVE METABOLIC PANEL WITH GFR
ALT: 14 U/L (ref 0–44)
AST: 16 U/L (ref 15–41)
Albumin: 3.5 g/dL (ref 3.5–5.0)
Alkaline Phosphatase: 79 U/L (ref 38–126)
Anion gap: 13 (ref 5–15)
BUN: 12 mg/dL (ref 8–23)
CO2: 24 mmol/L (ref 22–32)
Calcium: 8.5 mg/dL — ABNORMAL LOW (ref 8.9–10.3)
Chloride: 100 mmol/L (ref 98–111)
Creatinine, Ser: 0.83 mg/dL (ref 0.61–1.24)
GFR, Estimated: >60 mL/min (ref >60)
Glucose, Bld: 148 mg/dL — ABNORMAL HIGH (ref 70–99)
Potassium: 3.4 mmol/L — ABNORMAL LOW (ref 3.5–5.1)
Sodium: 137 mmol/L (ref 135–145)
Total Bilirubin: 0.9 mg/dL (ref 0.0–1.2)
Total Protein: 6.5 g/dL (ref 6.5–8.1)

## 2024-05-29 LAB — CBC WITH DIFFERENTIAL/PLATELET
Abs Immature Granulocytes: 0 K/uL (ref 0.00–0.07)
Basophils Absolute: 0 K/uL (ref 0.0–0.1)
Basophils Relative: 0 %
Eosinophils Absolute: 0 K/uL (ref 0.0–0.5)
Eosinophils Relative: 0 %
HCT: 27.4 % — ABNORMAL LOW (ref 39.0–52.0)
Hemoglobin: 9.2 g/dL — ABNORMAL LOW (ref 13.0–17.0)
Immature Granulocytes: 0 %
Lymphocytes Relative: 94 %
Lymphs Abs: 1.1 K/uL (ref 0.7–4.0)
MCH: 32.3 pg (ref 26.0–34.0)
MCHC: 33.6 g/dL (ref 30.0–36.0)
MCV: 96.1 fL (ref 80.0–100.0)
Monocytes Absolute: 0 K/uL — ABNORMAL LOW (ref 0.1–1.0)
Monocytes Relative: 3 %
Neutro Abs: 0 K/uL — CL (ref 1.7–7.7)
Neutrophils Relative %: 3 %
Platelets: 16 K/uL — CL (ref 150–400)
RBC: 2.85 MIL/uL — ABNORMAL LOW (ref 4.22–5.81)
RDW: 16.1 % — ABNORMAL HIGH (ref 11.5–15.5)
WBC: 1.2 K/uL — CL (ref 4.0–10.5)
nRBC: 0 % (ref 0.0–0.2)

## 2024-05-29 LAB — MAGNESIUM: Magnesium: 1.9 mg/dL (ref 1.7–2.4)

## 2024-05-29 NOTE — Progress Notes (Signed)
 CRITICAL VALUE ALERT Critical value received:  WBC 1.2,  ANC 0.0, Platelets 16,000.  Date of notification:  05-29-2024 Time of notification: 09:50 am.  Critical value read back:  Yes.   Nurse who received alert:  B.Gorje Iyer RN.  MD notified time and response:  Dr. Davonna @ 10:00 am. [facility] standing orders followed.

## 2024-05-29 NOTE — Progress Notes (Signed)
 Lab work faxed to [employer]

## 2024-05-29 NOTE — Progress Notes (Signed)
 Patients double port flushed without difficulty.  Good blood return noted with from both ports with no bruising or swelling noted at site.  Labs drawn per orders.  VSS with discharge and left in satisfactory condition with no s/s of distress noted. All follow ups as scheduled.       Mark Foster

## 2024-05-30 ENCOUNTER — Inpatient Hospital Stay

## 2024-05-30 DIAGNOSIS — C9200 Acute myeloblastic leukemia, not having achieved remission: Secondary | ICD-10-CM | POA: Diagnosis not present

## 2024-05-30 MED ORDER — DIPHENHYDRAMINE HCL 25 MG PO CAPS
25.0000 mg | ORAL_CAPSULE | Freq: Once | ORAL | Status: AC
  Administered 2024-05-30: 25 mg via ORAL
  Filled 2024-05-30: qty 1

## 2024-05-30 MED ORDER — ACETAMINOPHEN 325 MG PO TABS
650.0000 mg | ORAL_TABLET | Freq: Once | ORAL | Status: AC
  Administered 2024-05-30: 650 mg via ORAL
  Filled 2024-05-30: qty 2

## 2024-05-30 MED ORDER — SODIUM CHLORIDE 0.9% IV SOLUTION: 250.0000 mL | INTRAVENOUS | Status: AC

## 2024-05-30 NOTE — Progress Notes (Unsigned)
One unit of platelets  given per orders. Patient tolerated it well without problems. Vitals stable and discharged home from clinic ambulatory. Follow up as scheduled.

## 2024-05-30 NOTE — Patient Instructions (Addendum)
 [facility] [place] - A DEPT OF [facility]  Discharge Instructions: Thank you for choosing [facility] to provide your oncology and hematology care.  If you have a lab appointment with the Cancer Center - please note that after April 8th, 2024, all labs will be drawn in the cancer center.  You do not have to check in or register with the main entrance as you have in the past but will complete your check-in in the cancer center.  Wear comfortable clothing and clothing appropriate for easy access to any Portacath or PICC line.   We strive to give you quality time with your provider. You may need to reschedule your appointment if you arrive late (15 or more minutes).  Arriving late affects you and other patients whose appointments are after yours.  Also, if you miss three or more appointments without notifying the office, you may be dismissed from the clinic at the provider's discretion.      For prescription refill requests, have your pharmacy contact our office and allow 72 hours for refills to be completed.    Today you received the following one unit of platelets   To help prevent nausea and vomiting after your treatment, we encourage you to take your nausea medication as directed.  BELOW ARE SYMPTOMS THAT SHOULD BE REPORTED IMMEDIATELY: *FEVER GREATER THAN 100.4 F (38 C) OR HIGHER *CHILLS OR SWEATING *NAUSEA AND VOMITING THAT IS NOT CONTROLLED WITH YOUR NAUSEA MEDICATION *UNUSUAL SHORTNESS OF BREATH *UNUSUAL BRUISING OR BLEEDING *URINARY PROBLEMS (pain or burning when urinating, or frequent urination) *BOWEL PROBLEMS (unusual diarrhea, constipation, pain near the anus) TENDERNESS IN MOUTH AND THROAT WITH OR WITHOUT PRESENCE OF ULCERS (sore throat, sores in mouth, or a toothache) UNUSUAL RASH, SWELLING OR PAIN  UNUSUAL VAGINAL DISCHARGE OR ITCHING   Items with * indicate a potential emergency and should be followed up as soon as possible or go to  the Emergency Department if any problems should occur.  Please show the CHEMOTHERAPY ALERT CARD or IMMUNOTHERAPY ALERT CARD at check-in to the Emergency Department and triage nurse.  Should you have questions after your visit or need to cancel or reschedule your appointment, please contact [facility] [place] - A DEPT OF [facility] [phone]  and follow the prompts.  Office hours are 8:00 a.m. to 4:30 p.m. Monday - Friday. Please note that voicemails left after 4:00 p.m. may not be returned until the following business day.  We are closed weekends and major holidays. You have access to a nurse at all times for urgent questions. Please call the main number to the clinic [phone] and follow the prompts.  For any non-urgent questions, you may also contact your provider using MyChart. We now offer e-Visits for anyone 65 and older to request care online for non-urgent symptoms. For details visit [email or web address].   Also download the MyChart app! Go to the app store, search "MyChart", open the app, select [place], and log in with your MyChart username and password.

## 2024-05-31 LAB — PREPARE PLATELET PHERESIS: Unit division: 0

## 2024-05-31 LAB — BPAM PLATELET PHERESIS
Blood Product Expiration Date: 202509282359
ISSUE DATE / TIME: 202509260938
Unit Type and Rh: 5100

## 2024-06-02 ENCOUNTER — Inpatient Hospital Stay

## 2024-06-02 DIAGNOSIS — C9200 Acute myeloblastic leukemia, not having achieved remission: Secondary | ICD-10-CM | POA: Diagnosis not present

## 2024-06-02 DIAGNOSIS — T451X5S Adverse effect of antineoplastic and immunosuppressive drugs, sequela: Secondary | ICD-10-CM

## 2024-06-02 LAB — PREPARE RBC (CROSSMATCH)

## 2024-06-02 LAB — COMPREHENSIVE METABOLIC PANEL WITH GFR
ALT: 16 U/L (ref 0–44)
AST: 21 U/L (ref 15–41)
Albumin: 3.4 g/dL — ABNORMAL LOW (ref 3.5–5.0)
Alkaline Phosphatase: 79 U/L (ref 38–126)
Anion gap: 8 (ref 5–15)
BUN: 16 mg/dL (ref 8–23)
CO2: 23 mmol/L (ref 22–32)
Calcium: 8 mg/dL — ABNORMAL LOW (ref 8.9–10.3)
Chloride: 101 mmol/L (ref 98–111)
Creatinine, Ser: 0.9 mg/dL (ref 0.61–1.24)
GFR, Estimated: >60 mL/min (ref >60)
Glucose, Bld: 363 mg/dL — ABNORMAL HIGH (ref 70–99)
Potassium: 4 mmol/L (ref 3.5–5.1)
Sodium: 132 mmol/L — ABNORMAL LOW (ref 135–145)
Total Bilirubin: 0.5 mg/dL (ref 0.0–1.2)
Total Protein: 6.3 g/dL — ABNORMAL LOW (ref 6.5–8.1)

## 2024-06-02 LAB — CBC WITH DIFFERENTIAL/PLATELET
Basophils Absolute: 0 K/uL (ref 0.0–0.1)
Basophils Relative: 0 %
Eosinophils Absolute: 0 K/uL (ref 0.0–0.5)
Eosinophils Relative: 0 %
HCT: 24.6 % — ABNORMAL LOW (ref 39.0–52.0)
Hemoglobin: 8.5 g/dL — ABNORMAL LOW (ref 13.0–17.0)
Lymphocytes Relative: 85 %
Lymphs Abs: 1.3 K/uL (ref 0.7–4.0)
MCH: 33.1 pg (ref 26.0–34.0)
MCHC: 34.6 g/dL (ref 30.0–36.0)
MCV: 95.7 fL (ref 80.0–100.0)
Monocytes Absolute: 0 K/uL — ABNORMAL LOW (ref 0.1–1.0)
Monocytes Relative: 0 %
Neutro Abs: 0.2 K/uL — CL (ref 1.7–7.7)
Neutrophils Relative %: 15 %
Platelets: 29 K/uL — CL (ref 150–400)
RBC: 2.57 MIL/uL — ABNORMAL LOW (ref 4.22–5.81)
RDW: 16.1 % — ABNORMAL HIGH (ref 11.5–15.5)
WBC: 1.5 K/uL — ABNORMAL LOW (ref 4.0–10.5)
nRBC: 0 % (ref 0.0–0.2)

## 2024-06-02 LAB — SAMPLE TO BLOOD BANK

## 2024-06-02 LAB — MAGNESIUM: Magnesium: 1.9 mg/dL (ref 1.7–2.4)

## 2024-06-02 NOTE — Progress Notes (Signed)
 Labs drawn for [facility] and BBS.  Patients port flushed without difficulty.  Good blood return noted with no bruising or swelling noted at site.  Dressing intact.   VSS with discharge and left in satisfactory condition with no s/s of distress noted.

## 2024-06-02 NOTE — Addendum Note (Signed)
 Addended by: JENELLE DOROTHYANN PARAS on: 06/02/2024 10:59 AM   Modules accepted: Orders

## 2024-06-02 NOTE — Progress Notes (Signed)
 Ordered one unit of irradiated PRBC per [facility] protocol for hemoglobin of 8.5. Spoke with pt and he wanted the transfusion and is aware of the time.

## 2024-06-02 NOTE — Progress Notes (Unsigned)
 CRITICAL VALUE ALERT Critical value received:  ANC 0.2, platelets 29 Date of notification:  06-02-24 Time of notification: 1018 Critical value read back:  Yes.   Nurse who received alert:  C. Junaid Wurzer RN MD notified time and response:  1019 Rebekah Pennington PA-C. Will fax results to [facility] per protocol.

## 2024-06-03 ENCOUNTER — Ambulatory Visit

## 2024-06-03 ENCOUNTER — Inpatient Hospital Stay

## 2024-06-03 DIAGNOSIS — C9200 Acute myeloblastic leukemia, not having achieved remission: Secondary | ICD-10-CM | POA: Diagnosis not present

## 2024-06-03 MED ORDER — DIPHENHYDRAMINE HCL 25 MG PO CAPS
25.0000 mg | ORAL_CAPSULE | Freq: Once | ORAL | Status: AC
  Administered 2024-06-03: 25 mg via ORAL
  Filled 2024-06-03: qty 1

## 2024-06-03 MED ORDER — SODIUM CHLORIDE 0.9% IV SOLUTION
250.0000 mL | INTRAVENOUS | Status: DC
  Administered 2024-06-03: 100 mL via INTRAVENOUS

## 2024-06-03 MED ORDER — ACETAMINOPHEN 325 MG PO TABS
650.0000 mg | ORAL_TABLET | Freq: Once | ORAL | Status: AC
  Administered 2024-06-03: 650 mg via ORAL
  Filled 2024-06-03: qty 2

## 2024-06-03 NOTE — Progress Notes (Signed)
 Patient  is having a headache and rates a 3 and described as dull and points to his forehead post infusion.  Reviewed side effects of transfusion and reminded of last dose of tylenol  and when he can take more if needed.  Patient stated I'll be ok.  Reminded patient when to report to the Er with understanding verbalized. No s/s of distress noted.   Oncologist notified with no orders received.    Patient tolerated transfusion.  Side effects with management reviewed with understanding verbalized.  Port site clean and dry with no bruising or swelling noted at site.  Good blood return noted before and after administration.  Band aid applied.  Patient left in satisfactory condition with VSS and no s/s of distress noted.

## 2024-06-03 NOTE — Patient Instructions (Signed)
 [facility] [place] - A DEPT OF [place]  Discharge Instructions: Thank you for choosing [facility] to provide your oncology and hematology care.  If you have a lab appointment with the Cancer Center - please note that after April 8th, 2024, all labs will be drawn in the cancer center.  You do not have to check in or register with the main entrance as you have in the past but will complete your check-in in the cancer center.  Wear comfortable clothing and clothing appropriate for easy access to any Portacath or PICC line.   We strive to give you quality time with your provider. You may need to reschedule your appointment if you arrive late (15 or more minutes).  Arriving late affects you and other patients whose appointments are after yours.  Also, if you miss three or more appointments without notifying the office, you may be dismissed from the clinic at the provider's discretion.      For prescription refill requests, have your pharmacy contact our office and allow 72 hours for refills to be completed.      To help prevent nausea and vomiting after your treatment, we encourage you to take your nausea medication as directed.  BELOW ARE SYMPTOMS THAT SHOULD BE REPORTED IMMEDIATELY: *FEVER GREATER THAN 100.4 F (38 C) OR HIGHER *CHILLS OR SWEATING *NAUSEA AND VOMITING THAT IS NOT CONTROLLED WITH YOUR NAUSEA MEDICATION *UNUSUAL SHORTNESS OF BREATH *UNUSUAL BRUISING OR BLEEDING *URINARY PROBLEMS (pain or burning when urinating, or frequent urination) *BOWEL PROBLEMS (unusual diarrhea, constipation, pain near the anus) TENDERNESS IN MOUTH AND THROAT WITH OR WITHOUT PRESENCE OF ULCERS (sore throat, sores in mouth, or a toothache) UNUSUAL RASH, SWELLING OR PAIN  UNUSUAL VAGINAL DISCHARGE OR ITCHING   Items with * indicate a potential emergency and should be followed up as soon as possible or go to the Emergency Department if any problems should  occur.  Please show the CHEMOTHERAPY ALERT CARD or IMMUNOTHERAPY ALERT CARD at check-in to the Emergency Department and triage nurse.  Should you have questions after your visit or need to cancel or reschedule your appointment, please contact [facility] [place] - A DEPT OF [name] [phone]  and follow the prompts.  Office hours are 8:00 a.m. to 4:30 p.m. Monday - Friday. Please note that voicemails left after 4:00 p.m. may not be returned until the following business day.  We are closed weekends and major holidays. You have access to a nurse at all times for urgent questions. Please call the main number to the clinic [phone] and follow the prompts.  For any non-urgent questions, you may also contact your provider using MyChart. We now offer e-Visits for anyone 22 and older to request care online for non-urgent symptoms. For details visit [email or web address].   Also download the MyChart app! Go to the app store, search MyChart, open the app, select [place], and log in with your MyChart username and password.  Blood Transfusion, Adult, Care After The following information offers guidance on how to care for yourself after your procedure. Your health care provider may also give you more specific instructions. If you have problems or questions, contact your health care provider. What can I expect after the procedure? After the procedure, it is common to have: Bruising and soreness where the IV was inserted. A headache. Follow these instructions at home: IV insertion site care     Follow instructions  from your health care provider about how to take care of your IV insertion site. Make sure you: Wash your hands with soap and water  for at least 20 seconds before and after you change your bandage (dressing). If soap and water  are not available, use hand sanitizer. Change your dressing as told by your health care provider. Check your IV insertion site  every day for signs of infection. Check for: Redness, swelling, or pain. Bleeding from the site. Warmth. Pus or a bad smell. General instructions Take over-the-counter and prescription medicines only as told by your health care provider. Rest as told by your health care provider. Return to your normal activities as told by your health care provider. Keep all follow-up visits. Lab tests may need to be done at certain periods to recheck your blood counts. Contact a health care provider if: You have itching or red, swollen areas of skin (hives). You have a fever or chills. You have pain in the head, back, or chest. You feel anxious or you feel weak after doing your normal activities. You have redness, swelling, warmth, or pain around the IV insertion site. You have blood coming from the IV insertion site that does not stop with pressure. You have pus or a bad smell coming from your IV insertion site. If you received your blood transfusion in an outpatient setting, you will be told whom to contact to report any reactions. Get help right away if: You have symptoms of a serious allergic or immune system reaction, including: Trouble breathing or shortness of breath. Swelling of the face, feeling flushed, or widespread rash. Dark urine or blood in the urine. Fast heartbeat. These symptoms may be an emergency. Get help right away. Call 911. Do not wait to see if the symptoms will go away. Do not drive yourself to the hospital. Summary Bruising and soreness around the IV insertion site are common. Check your IV insertion site every day for signs of infection. Rest as told by your health care provider. Return to your normal activities as told by your health care provider. Get help right away for symptoms of a serious allergic or immune system reaction to the blood transfusion. This information is not intended to replace advice given to you by your health care provider. Make sure you discuss any  questions you have with your health care provider. Document Revised: 11/18/2021 Document Reviewed: 11/18/2021 Elsevier Patient Education  2024 [employer].

## 2024-06-04 LAB — TYPE AND SCREEN
ABO/RH(D): A POS
Antibody Screen: NEGATIVE
Unit division: 0

## 2024-06-04 LAB — BPAM RBC
Blood Product Expiration Date: 202510182359
ISSUE DATE / TIME: 202509300926
Unit Type and Rh: 202510182359
Unit Type and Rh: 6200

## 2024-06-04 NOTE — Progress Notes (Signed)
 " Patient Care Team: Shona Norleen PEDLAR, MD as PCP - General (Internal Medicine) Debera Jayson MATSU, MD as PCP - Cardiology (Cardiology)  Clinic Day:  06/05/2024  Referring physician: Shona Norleen PEDLAR, MD   CHIEF COMPLAINT:  CC: Stage IIb (T2N1) pancreatic adenocarcinoma and Therapy related AML   Mark Foster 68 y.o. male was transferred to my care after his prior physician has left.   ASSESSMENT & PLAN:   Assessment & Plan: Mark Foster  is a 68 y.o. 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  Oncology history below. Received induction chemotherapy with daunorubicin  and cytarabine  at [facility] and is currently receiving consolidation cytarabine .   He is getting transfusion support here twice a week as needed.    -Labs reviewed today: CMP: Potassium: 3.7, creatinine: 0.92, normal LFTs.  CBC: WBC: 2.0, hemoglobin: 10.4, platelets: 19, ANC: 400 -Will administer 1 unit of platelets. -Continue to follow with Dr. Perri at [facility]. - Continue Maxine for antifungal prophylaxis  - Monitor for fevers and advise ER visit if fever exceeds 100.14F due to infection risk.  Will continue labs twice a week and follow-up with me in clinic in 1 month.  Pancreatic adenocarcinoma History of stage IIb pancreatic adenocarcinoma s/p pancreatectomy and splenectomy and adjuvant chemotherapy with FOLFIRINOX for 12 cycles In remission since then  -Last CT scan on Care Everywhere from 05/13/2024 showed no evidence of disease.  Repeat in 6 months. - Will obtain CA 19-9 with next blood draw.  Type 2 diabetes mellitus complicated by diabetic neuropathy Long-standing diabetes with neuropathy. Current gabapentin  ineffective. Considering Lyrica but monitoring symptoms for now.  - Consider starting Lyrica 25 mg once daily for neuropathy if symptoms worsen.  But  patient wants to wait at this time. - Monitor neuropathy symptoms and reassess treatment efficacy.  The patient understands the plans discussed today and is in agreement with them.  He knows to contact our office if he develops concerns prior to his next appointment.  60 minutes of total time was spent for this patient encounter, including preparation,review of records,  face-to-face counseling with the patient and coordination of care, physical exam, and documentation of the encounter.    Mark Foster,acting as a neurosurgeon for [name], MD.,have documented all relevant documentation on the behalf of [name], MD,as directed by  [name], MD while in the presence of [name], MD.  I, [name] MD, have reviewed the above documentation for accuracy and completeness, and I agree with the above.    [name], MD  [facility] [place] - A DEPT OF [name] [address]  [place] [address] Dept: [phone] Dept Fax: [phone]   Orders Placed This Encounter  Procedures   Iron and TIBC ([facility] ONLY)    Standing Status:   Future    Expected Date:   06/26/2024    Expiration Date:   09/24/2024   Ferritin    Standing Status:   Future    Expected Date:   06/26/2024    Expiration Date:   09/24/2024   Vitamin B12    Standing Status:   Future    Expected Date:   06/26/2024    Expiration Date:   09/24/2024   Folate    Standing Status:   Future    Expected Date:  06/26/2024    Expiration Date:   09/24/2024   Cancer antigen 19-9    Standing Status:   Future    Expected Date:   06/09/2024    Expiration Date:   06/05/2025     ONCOLOGY HISTORY:   I have reviewed his chart and materials related to his cancer extensively and collaborated history with the patient. Summary of oncologic history is as follows:   Diagnosis: Stage IIb (T2N1) pancreatic adenocarcinoma    -08/20/2018: CT abdomen: Atrophy and ductal dilatation involving the pancreatic tail, with suspected small soft tissue mass in the pancreatic body which could represent pancreatic carcinoma. -08/30/2018: MRI abdomen: Although not definitive, there remains concern of a small hypoenhancing mass at the junction of the pancreatic body and tail associated with atrophy and ductal dilatation in the pancreatic tail. No evidence of metastatic disease.  -10/03/2018: FNA of pancreas body.   Cytology: Adenocarcinoma.  -10/09/2018: CA 19.9: 60 (elevated) -10/17/2018: Initial PET: Tiny focus of hypermetabolism identified in the body of pancreas, adjacent to the abrupt cut off of the main pancreatic duct. No evidence for hypermetabolic metastatic disease in the neck, chest, abdomen, or pelvis. 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  Pathology: Invasive well differentiated ductal adenocarcinoma of the pancreas, 2.8 cm. No perineural or lymphovascular invasion identified. Negative margins. Two of sixteen lymph nodes positive for metastatic carcinoma (2/16). No evidence of malignancy in spleen. Staging: pT2, pN1. -12/30/2018-06/04/2019: 12 cycles of FOLFIRINOX  -05/19/2019: MRI Brain: No evidence of acute intracranial abnormality or intracranial metastatic disease. -03/24/2020: CT AP: Status post distal pancreatectomy and splenectomy. Unchanged low-attenuation lesion of the pancreatic uncinate measuring 1.2 x 0.8 cm. Unchanged low-attenuation lesion near the surgical margin at the pancreatic head neck junction measuring 1.2 x 0.9 cm. Unchanged prominent retroperitoneal lymph nodes.  -2021-12/2023: Patient under surveillance with imaging showing stable to NED and CA 19.9 within normal range  - 05/13/2024: CT CAP: No evidence of new or progressive disease.  Decreased inflammatory changes along the residual pancreatic head.  Resolution of previously  noted lung consolidations with residual indeterminate pulmonary micronodules.   Diagnosis: Therapy related AML   -Diagnosis thought to be contributed to from previous chemotherapy with oxaliplatin , irinotecan  and 5-FU in 2020  -Presentation: worsening cytopenias, prompting nutritional deficiency workup -05/06/2023: CBC diff: low WBC at 0.9 with ANC at 0.4, low RBC at 3.29, HGB at 12.1, MCV at 102.4, and platelets at 54 K.  -05/07/2023: Pathologist smear review showing pancytopenia.  -12/20/2023: CBC diff: low WBC at 2.3 with ANC at 0.5, RBC at 3.12, HGB at 11.4 with MCV at 107.4, and platelets at 82 K. -12/27/2023: SPEP, light chains, and immunofixation were normal. -12/27/2023: NGS Myeloid: No clinically significant variants detected -02/12/2024: Bone Marrow Biopsy.  -Pathology: AML with 20% blasts by aspirate differential, 7% myeloid blasts (CD34, CD33, CD117, HLA-DR) by flow cytometry and 30 to 40% blasts by IHC on the biopsy.   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 Perri [hematology/oncology] at [facility] who recommended treatment initiation with intent to proceed to allogeneic transplant  -03/10/2024: Port inserted by IR at [facility] -03/14/2024-current: Daunorubicin , cytarabine  (Vyxeos ) induction therapy with Udenyca  given on 05/16/2024, done as inpatient at [facility].  Complications included thrombocytopenia with diffuse petechiae, large tongue hematoma, oral mucositis, rash, and neutropenia  -04/02/2024: Bone Marrow Biopsy.  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  -04/22/2024: Bone Marrow Biopsy.  -Pathology: Normocellular marrow (30%) with erythroid predominance and no increased blasts. -Chromosome Analysis: 46,XY,t(6;6)(p10;p10)[2]/46,XY[18]    Current Treatment: Consolidation cytarabine   infusion  INTERVAL HISTORY:    Discussed the use of AI scribe software for clinical note transcription with the patient, who gave verbal consent to proceed.  History of Present Illness Mark Foster is here today for follow up and to establish care with me for therapy-related AML and pancreatic adenocarcinoma. Patient is accompanied by his wife today.   He is receiving consolidation treatment for leukemia at [facility] and is receiving transfusion support here. No recent fevers or chills.  He experiences tenderness in his mouth, which is sensitive due to wearing dentures. He was prescribed magic mouthwash but has not used it since the symptoms cleared up. He is able to eat solid foods, although the front of his mouth remains sensitive.  He experiences leg pain at night, described as similar to his neuropathy pain. He takes gabapentin  three times a day for neuropathy, but it is not very effective. He has been diabetic for over twenty years, initially managed with pills and now with injections. Neurological tests have indicated neuropathy, likely related to his diabetes.  He feels fatigued. He has a history of being very active, working as a armed forces training and education officer for 38 years, but now feels too tired to consider returning to work.   I have reviewed the past medical history, past surgical history, social history and family history with the patient and they are unchanged from previous note.  ALLERGIES:  is allergic to demerol  [meperidine  hcl].  MEDICATIONS:  Current Outpatient Medications  Medication Sig Dispense Refill   acyclovir (ZOVIRAX) 400 MG tablet Take 400 mg by mouth.     albuterol  (VENTOLIN  HFA) 108 (90 Base) MCG/ACT inhaler Inhale into the lungs.     aspirin  EC 81 MG tablet Take 1 tablet (81 mg total) by mouth daily with breakfast. 90 tablet 3   atorvastatin  (LIPITOR ) 80 MG tablet Take 1 tablet (80 mg total) by mouth daily at 6 PM. (Patient taking differently: Take  80 mg by mouth daily.) 90 tablet 1   Cholecalciferol  (VITAMIN D3) 25 MCG (1000 UT) capsule Take 1,000 Units by mouth daily.     CINNAMON PO Take by mouth.     Continuous Glucose Receiver (FREESTYLE LIBRE 3 READER) DEVI as directed.     Continuous Glucose Sensor (FREESTYLE LIBRE 3 PLUS SENSOR) MISC SMARTSIG:Every 3 Days     cyanocobalamin  (VITAMIN B12) 1000 MCG tablet Take 1 tablet (1,000 mcg total) by mouth daily. 90 tablet 3   ECHINACEA COMPLEX PO Take 1,000 mg by mouth daily.     ezetimibe  (ZETIA ) 10 MG tablet Take by mouth.     fluconazole (DIFLUCAN) 200 MG tablet SMARTSIG:1 Tablet(s) By Mouth     furosemide  (LASIX ) 40 MG tablet Take 40 mg by mouth.     gabapentin  (NEURONTIN ) 300 MG capsule Take 1 capsule (300 mg total) by mouth 3 (three) times daily. 270 capsule 3   GVOKE HYPOPEN 2-PACK 1 MG/0.2ML SOAJ      hydrocortisone  (ANUSOL -HC) 2.5 % rectal cream Place 1 Application rectally 2 (two) times daily. For 14 days 30 g 1   hydrOXYzine (ATARAX) 25 MG tablet Take 25 mg by mouth 2 (two) times daily as needed.     insulin  aspart (NOVOLOG  FLEXPEN) 100 UNIT/ML FlexPen Inject 14-20 Units into the skin 3 (three) times daily before meals. (  Patient taking differently: Inject 18-24 Units into the skin 3 (three) times daily before meals.) 30 mL 1   Insulin  Pen Needle 32G X 4 MM MISC 1 Device by Does not apply route daily. Use as directed to inject insulin  four times daily 200 each 2   Isavuconazonium Sulfate 186 MG CAPS Take 372 mg by mouth.     levofloxacin (LEVAQUIN) 500 MG tablet Take 500 mg by mouth.     levothyroxine  (SYNTHROID ) 50 MCG tablet TAKE 1 TABLET EVERY DAY 90 tablet 1   linaclotide  (LINZESS ) 290 MCG CAPS capsule Take 1 capsule (290 mcg total) by mouth daily before breakfast. 30 capsule 3   loratadine (CLARITIN) 10 MG tablet Take 10 mg by mouth daily.     metoprolol  succinate (TOPROL -XL) 25 MG 24 hr tablet Take 25 mg by mouth daily.     Multiple Vitamin (MULTIVITAMIN WITH MINERALS) TABS  tablet Take 1 tablet by mouth daily. 100 tablet 2   nitroGLYCERIN  (NITROSTAT ) 0.4 MG SL tablet Place 1 tablet (0.4 mg total) under the tongue every 5 (five) minutes as needed for chest pain. 25 tablet 3   Omega-3 1000 MG CAPS Take 1,000 mg by mouth daily.     ondansetron  (ZOFRAN ) 8 MG tablet Take 8 mg by mouth every 8 (eight) hours as needed.     ONETOUCH VERIO test strip 4 (four) times daily.     pravastatin (PRAVACHOL) 40 MG tablet Take 40 mg by mouth daily.     tamsulosin  (FLOMAX ) 0.4 MG CAPS capsule Take 1 capsule (0.4 mg total) by mouth daily after supper. 90 capsule 3   TOUJEO  MAX SOLOSTAR 300 UNIT/ML Solostar Pen INJECT 60 UNITS INTO THE SKIN AT BEDTIME. 6 mL 0   traMADol  (ULTRAM ) 50 MG tablet Take 50 mg by mouth 2 (two) times daily as needed.     zinc  sulfate 220 (50 Zn) MG capsule Take 1 capsule (220 mg total) by mouth daily. 90 capsule 3   No current facility-administered medications for this visit.   Facility-Administered Medications Ordered in Other Visits  Medication Dose Route Frequency Provider Last Rate Last Admin   0.9 %  sodium chloride  infusion (Manually program via Guardrails IV Fluids)  250 mL Intravenous Continuous Davonna Siad, MD       0.9 %  sodium chloride  infusion   Intravenous Continuous Rogers Hai, MD 20 mL/hr at 12/30/18 1351 New Bag at 12/30/18 1351   0.9 %  sodium chloride  infusion   Intravenous Continuous Katragadda, Sreedhar, MD 20 mL/hr at 12/30/18 1401 New Bag at 12/30/18 1401    REVIEW OF SYSTEMS:   Constitutional: Denies fevers, chills or abnormal weight loss Eyes: Denies blurriness of vision Ears, nose, mouth, throat, and face: Denies mucositis or sore throat Respiratory: Denies cough, dyspnea or wheezes Cardiovascular: Denies palpitation, chest discomfort or lower extremity swelling Gastrointestinal:  Denies nausea, heartburn or change in bowel habits Skin: Denies abnormal skin rashes Lymphatics: Denies new lymphadenopathy or easy  bruising Neurological:Denies numbness, tingling or new weaknesses Behavioral/Psych: Mood is stable, no new changes  All other systems were reviewed with the patient and are negative.   VITALS:  Blood pressure 125/71.  Wt Readings from Last 3 Encounters:  02/18/24 219 lb 5.7 oz (99.5 kg)  02/12/24 214 lb (97.1 kg)  01/17/24 218 lb 14.7 oz (99.3 kg)    There is no height or weight on file to calculate BMI.  Performance status (ECOG): 2 - Symptomatic, <50% confined to bed  PHYSICAL EXAM:  GENERAL:alert, no distress and comfortable SKIN: skin color, texture, turgor are normal, no rashes or significant lesions EYES: normal, Conjunctiva are pink and non-injected, sclera clear OROPHARYNX:no exudate, no erythema and lips, buccal mucosa, and tongue normal  NECK: supple, thyroid  normal size, non-tender, without nodularity LYMPH:  no palpable lymphadenopathy in the cervical, axillary or inguinal LUNGS: clear to auscultation and percussion with normal breathing effort HEART: regular rate & rhythm and no murmurs and no lower extremity edema ABDOMEN:abdomen soft, non-tender and normal bowel sounds Musculoskeletal:no cyanosis of digits and no clubbing  NEURO: alert & oriented x 3 with fluent speech, no focal motor/sensory deficits  LABORATORY DATA:  I have reviewed the data as listed Lab Results  Component Value Date   WBC 2.0 (L) 06/05/2024   NEUTROABS 0.4 (LL) 06/05/2024   HGB 10.4 (L) 06/05/2024   HCT 30.3 (L) 06/05/2024   MCV 92.9 06/05/2024   PLT 19 (LL) 06/05/2024      Chemistry      Component Value Date/Time   NA 138 06/05/2024 1014   K 3.7 06/05/2024 1014   CL 101 06/05/2024 1014   CO2 26 06/05/2024 1014   BUN 23 06/05/2024 1014   CREATININE 0.92 06/05/2024 1014   CREATININE 0.86 05/17/2020 1329      Component Value Date/Time   CALCIUM  9.4 06/05/2024 1014   ALKPHOS 95 06/05/2024 1014   AST 17 06/05/2024 1014   AST 16 12/11/2018 1018   ALT 13 06/05/2024 1014   ALT  19 12/11/2018 1018   BILITOT 0.3 06/05/2024 1014   BILITOT 0.8 12/11/2018 1018       RADIOGRAPHIC STUDIES: I have personally reviewed the radiological    Impression  1.  No evidence of new or progressive disease. 2.  Decreased inflammatory changes along the residual pancreatic head. 3.  Resolution of the previously noted lung consolidations with residual indeterminate pulmonary micronodules. "

## 2024-06-05 ENCOUNTER — Inpatient Hospital Stay

## 2024-06-05 ENCOUNTER — Inpatient Hospital Stay: Attending: Hematology | Admitting: Oncology

## 2024-06-05 VITALS — BP 125/71

## 2024-06-05 DIAGNOSIS — Z9221 Personal history of antineoplastic chemotherapy: Secondary | ICD-10-CM | POA: Diagnosis not present

## 2024-06-05 DIAGNOSIS — D508 Other iron deficiency anemias: Secondary | ICD-10-CM

## 2024-06-05 DIAGNOSIS — E1140 Type 2 diabetes mellitus with diabetic neuropathy, unspecified: Secondary | ICD-10-CM | POA: Diagnosis not present

## 2024-06-05 DIAGNOSIS — K1230 Oral mucositis (ulcerative), unspecified: Secondary | ICD-10-CM | POA: Insufficient documentation

## 2024-06-05 DIAGNOSIS — T451X5S Adverse effect of antineoplastic and immunosuppressive drugs, sequela: Secondary | ICD-10-CM

## 2024-06-05 DIAGNOSIS — Z8507 Personal history of malignant neoplasm of pancreas: Secondary | ICD-10-CM | POA: Insufficient documentation

## 2024-06-05 DIAGNOSIS — C9200 Acute myeloblastic leukemia, not having achieved remission: Secondary | ICD-10-CM | POA: Diagnosis present

## 2024-06-05 LAB — CBC WITH DIFFERENTIAL/PLATELET
Abs Immature Granulocytes: 0 K/uL (ref 0.00–0.07)
Basophils Absolute: 0 K/uL (ref 0.0–0.1)
Basophils Relative: 0 %
Eosinophils Absolute: 0 K/uL (ref 0.0–0.5)
Eosinophils Relative: 0 %
HCT: 30.3 % — ABNORMAL LOW (ref 39.0–52.0)
Hemoglobin: 10.4 g/dL — ABNORMAL LOW (ref 13.0–17.0)
Immature Granulocytes: 0 %
Lymphocytes Relative: 74 %
Lymphs Abs: 1.5 K/uL (ref 0.7–4.0)
MCH: 31.9 pg (ref 26.0–34.0)
MCHC: 34.3 g/dL (ref 30.0–36.0)
MCV: 92.9 fL (ref 80.0–100.0)
Monocytes Absolute: 0.1 K/uL (ref 0.1–1.0)
Monocytes Relative: 7 %
Neutro Abs: 0.4 K/uL — CL (ref 1.7–7.7)
Neutrophils Relative %: 19 %
Platelets: 19 K/uL — CL (ref 150–400)
RBC: 3.26 MIL/uL — ABNORMAL LOW (ref 4.22–5.81)
RDW: 17 % — ABNORMAL HIGH (ref 11.5–15.5)
WBC: 2 K/uL — ABNORMAL LOW (ref 4.0–10.5)
nRBC: 0 % (ref 0.0–0.2)

## 2024-06-05 LAB — COMPREHENSIVE METABOLIC PANEL WITH GFR
ALT: 13 U/L (ref 0–44)
AST: 17 U/L (ref 15–41)
Albumin: 4.2 g/dL (ref 3.5–5.0)
Alkaline Phosphatase: 95 U/L (ref 38–126)
Anion gap: 11 (ref 5–15)
BUN: 23 mg/dL (ref 8–23)
CO2: 26 mmol/L (ref 22–32)
Calcium: 9.4 mg/dL (ref 8.9–10.3)
Chloride: 101 mmol/L (ref 98–111)
Creatinine, Ser: 0.92 mg/dL (ref 0.61–1.24)
GFR, Estimated: >60 mL/min (ref >60)
Glucose, Bld: 190 mg/dL — ABNORMAL HIGH (ref 70–99)
Potassium: 3.7 mmol/L (ref 3.5–5.1)
Sodium: 138 mmol/L (ref 135–145)
Total Bilirubin: 0.3 mg/dL (ref 0.0–1.2)
Total Protein: 7 g/dL (ref 6.5–8.1)

## 2024-06-05 LAB — MAGNESIUM: Magnesium: 2.2 mg/dL (ref 1.7–2.4)

## 2024-06-05 LAB — SAMPLE TO BLOOD BANK

## 2024-06-05 NOTE — Progress Notes (Signed)
 CRITICAL VALUE ALERT Critical value received:  Platelets 19,000, ANC 0.4 Date of notification:  06-05-24 Time of notification: 1113 Critical value read back:  Yes.   Nurse who received alert:  C. PageRN MD notified time and response:  1115, Dr. Davonna will give one unit of platelets per [facility] guidelines tomorrow

## 2024-06-05 NOTE — Patient Instructions (Addendum)
 [facility] at [facility] Discharge Instructions   You were seen and examined today by Dr. Davonna.  She reviewed the results of your lab work which are normal/stable.   Return as scheduled.    Thank you for choosing [facility] at [facility] to provide your oncology and hematology care.  To afford each patient quality time with our provider, please arrive at least 15 minutes before your scheduled appointment time.   If you have a lab appointment with the Cancer Center please come in thru the Main Entrance and check in at the main information desk.  You need to re-schedule your appointment should you arrive 10 or more minutes late.  We strive to give you quality time with our providers, and arriving late affects you and other patients whose appointments are after yours.  Also, if you no show three or more times for appointments you may be dismissed from the clinic at the providers discretion.     Again, thank you for choosing [facility].  Our hope is that these requests will decrease the amount of time that you wait before being seen by our physicians.       _____________________________________________________________  Should you have questions after your visit to [facility], please contact our office at [phone] and follow the prompts.  Our office hours are 8:00 a.m. and 4:30 p.m. Monday - Friday.  Please note that voicemails left after 4:00 p.m. may not be returned until the following business day.  We are closed weekends and major holidays.  You do have access to a nurse 24-7, just call the main number to the clinic [phone] and do not press any options, hold on the line and a nurse will answer the phone.    For prescription refill requests, have your pharmacy contact our office and allow 72 hours.    Due to Covid, you will need to wear a mask upon entering the hospital. If you do not have a mask, a mask  will be given to you at the Main Entrance upon arrival. For doctor visits, patients may have 1 support person age 39 or older with them. For treatment visits, patients can not have anyone with them due to social distancing guidelines and our immunocompromised population.

## 2024-06-06 ENCOUNTER — Inpatient Hospital Stay

## 2024-06-06 DIAGNOSIS — C9200 Acute myeloblastic leukemia, not having achieved remission: Secondary | ICD-10-CM | POA: Diagnosis not present

## 2024-06-06 MED ORDER — ACETAMINOPHEN 325 MG PO TABS: 650.0000 mg | ORAL_TABLET | Freq: Once | ORAL | Status: DC

## 2024-06-06 MED ORDER — SODIUM CHLORIDE 0.9% IV SOLUTION
250.0000 mL | INTRAVENOUS | Status: DC
  Administered 2024-06-06: 100 mL via INTRAVENOUS

## 2024-06-06 MED ORDER — DIPHENHYDRAMINE HCL 25 MG PO CAPS
25.0000 mg | ORAL_CAPSULE | Freq: Once | ORAL | Status: AC
  Administered 2024-06-06: 25 mg via ORAL
  Filled 2024-06-06: qty 1

## 2024-06-06 NOTE — Progress Notes (Signed)
 Patient presents today for 1 unit of platelets.   1 unit of platelets given today per MD orders. Tolerated infusion without adverse affects. Vital signs stable. No complaints at this time. Discharged from clinic ambulatory in stable condition. Alert and oriented x 3. F/U with [facility] as scheduled.

## 2024-06-06 NOTE — Patient Instructions (Signed)
 [facility] - A DEPT OF [facility]  Discharge Instructions: Thank you for choosing [facility] to provide your oncology and hematology care.  If you have a lab appointment with the Cancer Center - please note that after April 8th, 2024, all labs will be drawn in the cancer center.  You do not have to check in or register with the main entrance as you have in the past but will complete your check-in in the cancer center.  Wear comfortable clothing and clothing appropriate for easy access to any Portacath or PICC line.   We strive to give you quality time with your provider. You may need to reschedule your appointment if you arrive late (15 or more minutes).  Arriving late affects you and other patients whose appointments are after yours.  Also, if you miss three or more appointments without notifying the office, you may be dismissed from the clinic at the provider's discretion.      For prescription refill requests, have your pharmacy contact our office and allow 72 hours for refills to be completed.    Today you received the following chemotherapy and/or immunotherapy agents 1 unit of platelets. Getting Platelets Through an IV (Platelet Transfusion) in Adults: What to Expect A platelet transfusion is when a person gets platelets from another person (donor) through an IV. Platelets are parts of blood that stick together and form a clot to help stop bleeding after an injury. If you don't have enough platelets, you might bleed or bruise easily. You may need a platelet transfusion if you have a health problem that causes a low number of platelets, such as thrombocytopenia. A platelet transfusion may be used to stop or prevent too much bleeding. Tell a health care provider about: Any reactions you've had during past transfusions. Any allergies you have. All medicines you take. These include vitamins, herbs, eye drops, and creams. Any bleeding problems you  have. Any surgeries you've had. Any health problems you have. Whether you're pregnant or may be pregnant. What are the risks? Your health care provider will talk with you about risks. These may include: Fever or chills. This might happen if your body reacts to the new cells. It can happen during or up to 4 hours after the transfusion. A mild allergy. You might get red, swollen areas on your skin (hives). You might feel itchy. A bad allergy. You might have trouble breathing or swelling around your face and lips. Very bad reactions are rare but can happen. These may be: Infection. This is rare because donated blood is carefully tested. Hemolytic reaction. This is when the defense system (immune system) of your body attacks the new cells. Symptoms include fever, chills, and a feeling that you may throw up. You may also have low blood pressure and pain in your chest or lower back. Transfusion-associated circulatory overload (TACO). This happens if fluids move to your lungs. This may cause breathing problems. Transfusion-related acute lung injury (TRALI). TRALI can cause breathing problems and low oxygen in your blood. This can happen within hours or days after the transfusion. Transfusion-associated graft-versus-host disease (TAGVHD). This happens when donated cells attack the body's healthy tissues. What happens before? Take your medicines only as told. You will have a blood test to find out your blood type. This helps match the donor blood with your blood. If you've had an allergy to a transfusion in the past, you may be given medicine to help prevent  another allergy. Your temperature, blood pressure, pulse, and breathing will be checked. What happens during platelet transfusion?  An IV will be put into a vein in your hand or arm. For your safety, two health care team members will check your identity and the donor platelets. The bag of donor platelets will be connected to your IV. The  platelets will flow into your blood. This usually takes 30-60 minutes. Your temperature, blood pressure, pulse, and breathing will be checked. This helps catch signs of a reaction early. You will be watched for symptoms of all types of reactions, like chills, hives, or itching. If you show signs of a reaction, the transfusion will be stopped. You may be given medicine to help treat the reaction. When your transfusion is done, your IV will be removed. Pressure may be put on the IV site for a few minutes to stop any bleeding. The IV site will be covered with a bandage. These steps may vary. Ask what you can expect. What happens after? You will be watched closely until you leave. This includes checking your blood pressure, temperature, pulse rate, and breathing rate again. This information is not intended to replace advice given to you by your health care provider. Make sure you discuss any questions you have with your health care provider. Document Revised: 12/07/2023 Document Reviewed: 12/07/2023 Elsevier Patient Education  2025 [employer].      To help prevent nausea and vomiting after your treatment, we encourage you to take your nausea medication as directed.  BELOW ARE SYMPTOMS THAT SHOULD BE REPORTED IMMEDIATELY: *FEVER GREATER THAN 100.4 F (38 C) OR HIGHER *CHILLS OR SWEATING *NAUSEA AND VOMITING THAT IS NOT CONTROLLED WITH YOUR NAUSEA MEDICATION *UNUSUAL SHORTNESS OF BREATH *UNUSUAL BRUISING OR BLEEDING *URINARY PROBLEMS (pain or burning when urinating, or frequent urination) *BOWEL PROBLEMS (unusual diarrhea, constipation, pain near the anus) TENDERNESS IN MOUTH AND THROAT WITH OR WITHOUT PRESENCE OF ULCERS (sore throat, sores in mouth, or a toothache) UNUSUAL RASH, SWELLING OR PAIN  UNUSUAL VAGINAL DISCHARGE OR ITCHING   Items with * indicate a potential emergency and should be followed up as soon as possible or go to the Emergency Department if any problems should  occur.  Please show the CHEMOTHERAPY ALERT CARD or IMMUNOTHERAPY ALERT CARD at check-in to the Emergency Department and triage nurse.  Should you have questions after your visit or need to cancel or reschedule your appointment, please contact [facility] [place] - A DEPT OF [name] [phone]  and follow the prompts.  Office hours are 8:00 a.m. to 4:30 p.m. Monday - Friday. Please note that voicemails left after 4:00 p.m. may not be returned until the following business day.  We are closed weekends and major holidays. You have access to a nurse at all times for urgent questions. Please call the main number to the clinic [phone] and follow the prompts.  For any non-urgent questions, you may also contact your provider using MyChart. We now offer e-Visits for anyone 28 and older to request care online for non-urgent symptoms. For details visit [email or web address].   Also download the MyChart app! Go to the app store, search MyChart, open the app, select [place], and log in with your MyChart username and password.

## 2024-06-07 LAB — PREPARE PLATELET PHERESIS: Unit division: 0

## 2024-06-07 LAB — BPAM PLATELET PHERESIS
Blood Product Expiration Date: 202510052359
ISSUE DATE / TIME: 202510030954
Unit Type and Rh: 5100

## 2024-06-09 ENCOUNTER — Inpatient Hospital Stay

## 2024-06-09 DIAGNOSIS — D508 Other iron deficiency anemias: Secondary | ICD-10-CM

## 2024-06-09 DIAGNOSIS — C9200 Acute myeloblastic leukemia, not having achieved remission: Secondary | ICD-10-CM | POA: Diagnosis not present

## 2024-06-09 DIAGNOSIS — T451X5S Adverse effect of antineoplastic and immunosuppressive drugs, sequela: Secondary | ICD-10-CM

## 2024-06-09 LAB — CBC WITH DIFFERENTIAL/PLATELET
Band Neutrophils: 1 %
Basophils Absolute: 0 K/uL (ref 0.0–0.1)
Basophils Relative: 0 %
Eosinophils Absolute: 0 K/uL (ref 0.0–0.5)
Eosinophils Relative: 0 %
HCT: 28 % — ABNORMAL LOW (ref 39.0–52.0)
Hemoglobin: 9.6 g/dL — ABNORMAL LOW (ref 13.0–17.0)
Lymphocytes Relative: 55 %
Lymphs Abs: 1.6 K/uL (ref 0.7–4.0)
MCH: 32 pg (ref 26.0–34.0)
MCHC: 34.3 g/dL (ref 30.0–36.0)
MCV: 93.3 fL (ref 80.0–100.0)
Monocytes Absolute: 0.3 K/uL (ref 0.1–1.0)
Monocytes Relative: 11 %
Neutro Abs: 1 K/uL — ABNORMAL LOW (ref 1.7–7.7)
Neutrophils Relative %: 33 %
Platelets: 49 K/uL — ABNORMAL LOW (ref 150–400)
RBC: 3 MIL/uL — ABNORMAL LOW (ref 4.22–5.81)
RDW: 16.6 % — ABNORMAL HIGH (ref 11.5–15.5)
WBC: 2.9 K/uL — ABNORMAL LOW (ref 4.0–10.5)
nRBC: 0.7 % — ABNORMAL HIGH (ref 0.0–0.2)

## 2024-06-09 LAB — COMPREHENSIVE METABOLIC PANEL WITH GFR
ALT: 17 U/L (ref 0–44)
AST: 22 U/L (ref 15–41)
Albumin: 4.1 g/dL (ref 3.5–5.0)
Alkaline Phosphatase: 98 U/L (ref 38–126)
Anion gap: 11 (ref 5–15)
BUN: 16 mg/dL (ref 8–23)
CO2: 25 mmol/L (ref 22–32)
Calcium: 9.1 mg/dL (ref 8.9–10.3)
Chloride: 103 mmol/L (ref 98–111)
Creatinine, Ser: 0.83 mg/dL (ref 0.61–1.24)
GFR, Estimated: >60 mL/min (ref >60)
Glucose, Bld: 91 mg/dL (ref 70–99)
Potassium: 3.7 mmol/L (ref 3.5–5.1)
Sodium: 139 mmol/L (ref 135–145)
Total Bilirubin: 0.2 mg/dL (ref 0.0–1.2)
Total Protein: 6.7 g/dL (ref 6.5–8.1)

## 2024-06-09 LAB — SAMPLE TO BLOOD BANK

## 2024-06-09 LAB — MAGNESIUM: Magnesium: 2 mg/dL (ref 1.7–2.4)

## 2024-06-09 NOTE — Progress Notes (Signed)
 Patients port flushed without difficulty.  Good blood return noted with no bruising or swelling noted at site.  Labs drawn per orders. Port left accessed per pt request due to possible transfusion 06/10/24.  VSS with discharge and left in satisfactory condition with no s/s of distress noted. All follow ups as scheduled.       Amir Fick

## 2024-06-09 NOTE — Progress Notes (Signed)
 Patient does not require transfusion today.  I called him to inform. He wishes to leave his port accessed until Thursday this week.  Proper dressing is in place and patient knows to call us  should he have any issues with it between now and Thursday. No further questions or concerns.

## 2024-06-10 ENCOUNTER — Ambulatory Visit

## 2024-06-10 LAB — CANCER ANTIGEN 19-9: CA 19-9: 14 U/mL (ref 0–35)

## 2024-06-12 ENCOUNTER — Inpatient Hospital Stay

## 2024-06-12 DIAGNOSIS — C9200 Acute myeloblastic leukemia, not having achieved remission: Secondary | ICD-10-CM | POA: Diagnosis not present

## 2024-06-12 LAB — CBC WITH DIFFERENTIAL/PLATELET
Abs Immature Granulocytes: 0.01 K/uL (ref 0.00–0.07)
Basophils Absolute: 0 K/uL (ref 0.0–0.1)
Basophils Relative: 0 %
Eosinophils Absolute: 0 K/uL (ref 0.0–0.5)
Eosinophils Relative: 0 %
HCT: 28 % — ABNORMAL LOW (ref 39.0–52.0)
Hemoglobin: 9.4 g/dL — ABNORMAL LOW (ref 13.0–17.0)
Immature Granulocytes: 0 %
Lymphocytes Relative: 46 %
Lymphs Abs: 1.8 K/uL (ref 0.7–4.0)
MCH: 31.8 pg (ref 26.0–34.0)
MCHC: 33.6 g/dL (ref 30.0–36.0)
MCV: 94.6 fL (ref 80.0–100.0)
Monocytes Absolute: 1.1 K/uL — ABNORMAL HIGH (ref 0.1–1.0)
Monocytes Relative: 28 %
Neutro Abs: 1 K/uL — ABNORMAL LOW (ref 1.7–7.7)
Neutrophils Relative %: 26 %
Platelets: 72 K/uL — ABNORMAL LOW (ref 150–400)
RBC: 2.96 MIL/uL — ABNORMAL LOW (ref 4.22–5.81)
RDW: 16.9 % — ABNORMAL HIGH (ref 11.5–15.5)
WBC: 4 K/uL (ref 4.0–10.5)
nRBC: 2 % — ABNORMAL HIGH (ref 0.0–0.2)

## 2024-06-12 LAB — COMPREHENSIVE METABOLIC PANEL WITH GFR
ALT: 23 U/L (ref 0–44)
AST: 26 U/L (ref 15–41)
Albumin: 3.9 g/dL (ref 3.5–5.0)
Alkaline Phosphatase: 99 U/L (ref 38–126)
Anion gap: 12 (ref 5–15)
BUN: 13 mg/dL (ref 8–23)
CO2: 24 mmol/L (ref 22–32)
Calcium: 9.1 mg/dL (ref 8.9–10.3)
Chloride: 106 mmol/L (ref 98–111)
Creatinine, Ser: 0.88 mg/dL (ref 0.61–1.24)
GFR, Estimated: >60 mL/min (ref >60)
Glucose, Bld: 145 mg/dL — ABNORMAL HIGH (ref 70–99)
Potassium: 4 mmol/L (ref 3.5–5.1)
Sodium: 141 mmol/L (ref 135–145)
Total Bilirubin: <0.2 mg/dL (ref 0.0–1.2)
Total Protein: 6.7 g/dL (ref 6.5–8.1)

## 2024-06-12 LAB — MAGNESIUM: Magnesium: 2.2 mg/dL (ref 1.7–2.4)

## 2024-06-12 LAB — SAMPLE TO BLOOD BANK

## 2024-06-12 NOTE — Progress Notes (Signed)
 Patients port flushed without difficulty.  Good blood return noted with no bruising or swelling noted at site.  Labs drawn per orders. Port left accessed per pt request due to possible transfusion 06/13/24. VSS with discharge and left in satisfactory condition with no s/s of distress noted. All follow ups as scheduled.      Mark Foster

## 2024-06-13 ENCOUNTER — Inpatient Hospital Stay

## 2024-06-13 NOTE — Progress Notes (Signed)
 NO blood products needed. Mark Foster presented for Portacath de-access and flush.  Portacath located right chest wall.   Good blood return present. Portacath flushed with 20ml NS and needle removed intact.  Procedure tolerated well and without incident.

## 2024-06-16 ENCOUNTER — Inpatient Hospital Stay

## 2024-06-16 ENCOUNTER — Telehealth: Payer: Self-pay

## 2024-06-16 DIAGNOSIS — C9200 Acute myeloblastic leukemia, not having achieved remission: Secondary | ICD-10-CM | POA: Diagnosis not present

## 2024-06-16 DIAGNOSIS — T451X5S Adverse effect of antineoplastic and immunosuppressive drugs, sequela: Secondary | ICD-10-CM

## 2024-06-16 LAB — COMPREHENSIVE METABOLIC PANEL WITH GFR
ALT: 77 U/L — ABNORMAL HIGH (ref 0–44)
AST: 76 U/L — ABNORMAL HIGH (ref 15–41)
Albumin: 3.8 g/dL (ref 3.5–5.0)
Alkaline Phosphatase: 99 U/L (ref 38–126)
Anion gap: 11 (ref 5–15)
BUN: 10 mg/dL (ref 8–23)
CO2: 21 mmol/L — ABNORMAL LOW (ref 22–32)
Calcium: 9 mg/dL (ref 8.9–10.3)
Chloride: 104 mmol/L (ref 98–111)
Creatinine, Ser: 0.8 mg/dL (ref 0.61–1.24)
GFR, Estimated: >60 mL/min (ref >60)
Glucose, Bld: 275 mg/dL — ABNORMAL HIGH (ref 70–99)
Potassium: 4.4 mmol/L (ref 3.5–5.1)
Sodium: 136 mmol/L (ref 135–145)
Total Bilirubin: 0.2 mg/dL (ref 0.0–1.2)
Total Protein: 6.5 g/dL (ref 6.5–8.1)

## 2024-06-16 LAB — CBC WITH DIFFERENTIAL/PLATELET
Abs Immature Granulocytes: 0.01 K/uL (ref 0.00–0.07)
Basophils Absolute: 0 K/uL (ref 0.0–0.1)
Basophils Relative: 0 %
Eosinophils Absolute: 0 K/uL (ref 0.0–0.5)
Eosinophils Relative: 0 %
HCT: 28.4 % — ABNORMAL LOW (ref 39.0–52.0)
Hemoglobin: 9.4 g/dL — ABNORMAL LOW (ref 13.0–17.0)
Immature Granulocytes: 0 %
Lymphocytes Relative: 37 %
Lymphs Abs: 1.6 K/uL (ref 0.7–4.0)
MCH: 31.4 pg (ref 26.0–34.0)
MCHC: 33.1 g/dL (ref 30.0–36.0)
MCV: 95 fL (ref 80.0–100.0)
Monocytes Absolute: 1.3 K/uL — ABNORMAL HIGH (ref 0.1–1.0)
Monocytes Relative: 30 %
Neutro Abs: 1.4 K/uL — ABNORMAL LOW (ref 1.7–7.7)
Neutrophils Relative %: 33 %
Platelets: 137 K/uL — ABNORMAL LOW (ref 150–400)
RBC: 2.99 MIL/uL — ABNORMAL LOW (ref 4.22–5.81)
RDW: 18.6 % — ABNORMAL HIGH (ref 11.5–15.5)
Smear Review: NORMAL
WBC: 4.3 K/uL (ref 4.0–10.5)
nRBC: 5.2 % — ABNORMAL HIGH (ref 0.0–0.2)

## 2024-06-16 LAB — SAMPLE TO BLOOD BANK

## 2024-06-16 LAB — MAGNESIUM: Magnesium: 2.1 mg/dL (ref 1.7–2.4)

## 2024-06-16 NOTE — Telephone Encounter (Signed)
 Called patient to let them know the hemoglobin results. No need for any transfusions tomorrow per guidelines. Follow up as scheduled.

## 2024-06-16 NOTE — Progress Notes (Signed)
 Patients port flushed without difficulty.  Good blood return noted with no bruising or swelling noted at site. Labs drawn per orders. Port left accessed per pt request due to possible transfusion on 06/17/24.  VSS with discharge and left in satisfactory condition with no s/s of distress noted. All follow ups as scheduled.       Gayatri Teasdale

## 2024-06-17 ENCOUNTER — Ambulatory Visit

## 2024-06-19 ENCOUNTER — Inpatient Hospital Stay

## 2024-06-19 DIAGNOSIS — C9200 Acute myeloblastic leukemia, not having achieved remission: Secondary | ICD-10-CM

## 2024-06-19 LAB — SAMPLE TO BLOOD BANK

## 2024-06-19 LAB — CBC WITH DIFFERENTIAL/PLATELET
Abs Immature Granulocytes: 0.01 K/uL (ref 0.00–0.07)
Basophils Absolute: 0 K/uL (ref 0.0–0.1)
Basophils Relative: 1 %
Eosinophils Absolute: 0 K/uL (ref 0.0–0.5)
Eosinophils Relative: 0 %
HCT: 28.5 % — ABNORMAL LOW (ref 39.0–52.0)
Hemoglobin: 9.5 g/dL — ABNORMAL LOW (ref 13.0–17.0)
Immature Granulocytes: 0 %
Lymphocytes Relative: 32 %
Lymphs Abs: 1.5 K/uL (ref 0.7–4.0)
MCH: 32 pg (ref 26.0–34.0)
MCHC: 33.3 g/dL (ref 30.0–36.0)
MCV: 96 fL (ref 80.0–100.0)
Monocytes Absolute: 1.4 K/uL — ABNORMAL HIGH (ref 0.1–1.0)
Monocytes Relative: 29 %
Neutro Abs: 1.8 K/uL (ref 1.7–7.7)
Neutrophils Relative %: 38 %
Platelets: 186 K/uL (ref 150–400)
RBC: 2.97 MIL/uL — ABNORMAL LOW (ref 4.22–5.81)
RDW: 20.4 % — ABNORMAL HIGH (ref 11.5–15.5)
Smear Review: NORMAL
WBC: 4.7 K/uL (ref 4.0–10.5)
nRBC: 5.3 % — ABNORMAL HIGH (ref 0.0–0.2)

## 2024-06-19 LAB — COMPREHENSIVE METABOLIC PANEL WITH GFR
ALT: 106 U/L — ABNORMAL HIGH (ref 0–44)
AST: 83 U/L — ABNORMAL HIGH (ref 15–41)
Albumin: 4.1 g/dL (ref 3.5–5.0)
Alkaline Phosphatase: 105 U/L (ref 38–126)
Anion gap: 10 (ref 5–15)
BUN: 12 mg/dL (ref 8–23)
CO2: 23 mmol/L (ref 22–32)
Calcium: 9 mg/dL (ref 8.9–10.3)
Chloride: 103 mmol/L (ref 98–111)
Creatinine, Ser: 0.78 mg/dL (ref 0.61–1.24)
GFR, Estimated: >60 mL/min (ref >60)
Glucose, Bld: 269 mg/dL — ABNORMAL HIGH (ref 70–99)
Potassium: 4.3 mmol/L (ref 3.5–5.1)
Sodium: 137 mmol/L (ref 135–145)
Total Bilirubin: 0.3 mg/dL (ref 0.0–1.2)
Total Protein: 6.7 g/dL (ref 6.5–8.1)

## 2024-06-19 LAB — MAGNESIUM: Magnesium: 2 mg/dL (ref 1.7–2.4)

## 2024-06-19 NOTE — Progress Notes (Signed)
 Mark Foster presented for Portacath access and flush.  Portacath located right chest wall accessed with  H 20 needle.  Good blood return present. Portacath flushed with 20ml normal saline . [facility] patient. Procedure tolerated well and without incident.   HGB 9.5 and platelets 186.  Discharged from clinic ambulatory in stable condition. Alert and oriented x 3. F/U with [facility] as scheduled. Labs faxed to [facility].

## 2024-06-20 ENCOUNTER — Ambulatory Visit

## 2024-06-23 ENCOUNTER — Inpatient Hospital Stay

## 2024-06-24 ENCOUNTER — Inpatient Hospital Stay

## 2024-06-24 DIAGNOSIS — T451X5S Adverse effect of antineoplastic and immunosuppressive drugs, sequela: Secondary | ICD-10-CM | POA: Diagnosis not present

## 2024-06-24 DIAGNOSIS — C9200 Acute myeloblastic leukemia, not having achieved remission: Secondary | ICD-10-CM | POA: Diagnosis not present

## 2024-06-24 DIAGNOSIS — Z5111 Encounter for antineoplastic chemotherapy: Secondary | ICD-10-CM | POA: Diagnosis not present

## 2024-06-24 DIAGNOSIS — I517 Cardiomegaly: Secondary | ICD-10-CM | POA: Diagnosis not present

## 2024-06-26 ENCOUNTER — Inpatient Hospital Stay

## 2024-06-26 DIAGNOSIS — Z5111 Encounter for antineoplastic chemotherapy: Secondary | ICD-10-CM | POA: Diagnosis not present

## 2024-06-26 DIAGNOSIS — T451X5S Adverse effect of antineoplastic and immunosuppressive drugs, sequela: Secondary | ICD-10-CM | POA: Diagnosis not present

## 2024-06-26 DIAGNOSIS — C9200 Acute myeloblastic leukemia, not having achieved remission: Secondary | ICD-10-CM | POA: Diagnosis not present

## 2024-06-27 ENCOUNTER — Inpatient Hospital Stay

## 2024-06-27 DIAGNOSIS — Z5111 Encounter for antineoplastic chemotherapy: Secondary | ICD-10-CM | POA: Diagnosis not present

## 2024-06-27 DIAGNOSIS — T451X5S Adverse effect of antineoplastic and immunosuppressive drugs, sequela: Secondary | ICD-10-CM | POA: Diagnosis not present

## 2024-06-27 DIAGNOSIS — C9200 Acute myeloblastic leukemia, not having achieved remission: Secondary | ICD-10-CM | POA: Diagnosis not present

## 2024-06-30 ENCOUNTER — Inpatient Hospital Stay (HOSPITAL_BASED_OUTPATIENT_CLINIC_OR_DEPARTMENT_OTHER): Admitting: Oncology

## 2024-06-30 ENCOUNTER — Inpatient Hospital Stay

## 2024-06-30 ENCOUNTER — Other Ambulatory Visit: Payer: Self-pay | Admitting: *Deleted

## 2024-06-30 DIAGNOSIS — C9200 Acute myeloblastic leukemia, not having achieved remission: Secondary | ICD-10-CM

## 2024-06-30 DIAGNOSIS — T451X5S Adverse effect of antineoplastic and immunosuppressive drugs, sequela: Secondary | ICD-10-CM | POA: Diagnosis not present

## 2024-06-30 DIAGNOSIS — C259 Malignant neoplasm of pancreas, unspecified: Secondary | ICD-10-CM

## 2024-06-30 DIAGNOSIS — D508 Other iron deficiency anemias: Secondary | ICD-10-CM

## 2024-06-30 DIAGNOSIS — R17 Unspecified jaundice: Secondary | ICD-10-CM | POA: Diagnosis not present

## 2024-06-30 DIAGNOSIS — K1230 Oral mucositis (ulcerative), unspecified: Secondary | ICD-10-CM

## 2024-06-30 LAB — IRON AND TIBC
Iron: 214 ug/dL — ABNORMAL HIGH (ref 45–182)
Saturation Ratios: 86 % — ABNORMAL HIGH (ref 17.9–39.5)
TIBC: 248 ug/dL — ABNORMAL LOW (ref 250–450)
UIBC: 34 ug/dL

## 2024-06-30 LAB — CBC WITH DIFFERENTIAL/PLATELET
Abs Immature Granulocytes: 0.4 K/uL — ABNORMAL HIGH (ref 0.00–0.07)
Basophils Absolute: 0 K/uL (ref 0.0–0.1)
Basophils Relative: 0 %
Eosinophils Absolute: 0 K/uL (ref 0.0–0.5)
Eosinophils Relative: 0 %
HCT: 32.8 % — ABNORMAL LOW (ref 39.0–52.0)
Hemoglobin: 11.2 g/dL — ABNORMAL LOW (ref 13.0–17.0)
Lymphocytes Relative: 2 %
Lymphs Abs: 0.7 K/uL (ref 0.7–4.0)
MCH: 32.7 pg (ref 26.0–34.0)
MCHC: 34.1 g/dL (ref 30.0–36.0)
MCV: 95.9 fL (ref 80.0–100.0)
Metamyelocytes Relative: 1 %
Monocytes Absolute: 0.4 K/uL (ref 0.1–1.0)
Monocytes Relative: 1 %
Neutro Abs: 33.9 K/uL — ABNORMAL HIGH (ref 1.7–7.7)
Neutrophils Relative %: 96 %
Platelets: 154 K/uL (ref 150–400)
RBC: 3.42 MIL/uL — ABNORMAL LOW (ref 4.22–5.81)
RDW: 21.9 % — ABNORMAL HIGH (ref 11.5–15.5)
Smear Review: NORMAL
WBC: 35.3 K/uL — ABNORMAL HIGH (ref 4.0–10.5)
nRBC: 0 % (ref 0.0–0.2)

## 2024-06-30 LAB — COMPREHENSIVE METABOLIC PANEL WITH GFR
ALT: 33 U/L (ref 0–44)
AST: 23 U/L (ref 15–41)
Albumin: 4.2 g/dL (ref 3.5–5.0)
Alkaline Phosphatase: 247 U/L — ABNORMAL HIGH (ref 38–126)
Anion gap: 15 (ref 5–15)
BUN: 18 mg/dL (ref 8–23)
CO2: 22 mmol/L (ref 22–32)
Calcium: 9.1 mg/dL (ref 8.9–10.3)
Chloride: 101 mmol/L (ref 98–111)
Creatinine, Ser: 0.8 mg/dL (ref 0.61–1.24)
GFR, Estimated: >60 mL/min (ref >60)
Glucose, Bld: 293 mg/dL — ABNORMAL HIGH (ref 70–99)
Potassium: 4 mmol/L (ref 3.5–5.1)
Sodium: 138 mmol/L (ref 135–145)
Total Bilirubin: 1.3 mg/dL — ABNORMAL HIGH (ref 0.0–1.2)
Total Protein: 7 g/dL (ref 6.5–8.1)

## 2024-06-30 LAB — SAMPLE TO BLOOD BANK

## 2024-06-30 LAB — FOLATE: Folate: 15.7 ng/mL (ref >5.9)

## 2024-06-30 LAB — FERRITIN: Ferritin: 1305 ng/mL — ABNORMAL HIGH (ref 24–336)

## 2024-06-30 LAB — VITAMIN B12: Vitamin B-12: >4000 pg/mL — ABNORMAL HIGH (ref 180–914)

## 2024-06-30 LAB — MAGNESIUM: Magnesium: 1.9 mg/dL (ref 1.7–2.4)

## 2024-06-30 MED ORDER — MAGIC MOUTHWASH W/LIDOCAINE: 10.0000 mL | Freq: Four times a day (QID) | ORAL | 0 refills | Status: AC | PRN

## 2024-06-30 NOTE — Progress Notes (Signed)
 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  Oncology history below. Received induction chemotherapy with daunorubicin and cytarabine at [facility] and is currently receiving consolidation cytarabine.   He is getting transfusion support and labs here twice a week as needed.    -Labs reviewed today: CMP: Potassium: 4.0, creatinine: 0. 8 0, alkaline phosphatase: 247, total bilirubin: 1.3.  Normal LFTs.  CBC: WBC: 35.3, hemoglobin: 11.2, platelets: 154. -Continue to follow with Dr. Perri at [facility] monthly.  Last seen in his clinic on 06/24/2024. - Continue Cresemba for antifungal prophylaxis  - Monitor for fevers and advise ER visit if fever exceeds 100.35F due to infection risk. - Transfusion guidelines: Transfuse 1 unit irradiated platelets for platelet count less than or equal to 20,000.  Transfused 2 units of irradiated packed red blood cells for hemoglobin less than or equal to 8 or less than or equal to 9 if symptomatic - Patient is to follow-up at [facility] on 08/05/2024 for follow-up and end of treatment bone marrow biopsy  Will continue labs twice a week and follow-up with me in clinic in 1 month.  Mucositis Patient reports severe mucositis today having difficulty swallowing Examination did not show oral thrush or visible mucositis  - Will refill Magic mouthwash today to be used as needed - Will give topical lidocaine  in the clinic today - Recommended to  use pain medication as needed  Pancreatic adenocarcinoma History of stage IIb pancreatic adenocarcinoma s/p pancreatectomy and splenectomy and adjuvant chemotherapy with FOLFIRINOX for 12 cycles In remission since then  -Last CT scan on [facility]where from 05/13/2024 showed no evidence of disease.  Repeat in 6 months. - CA 19-9  form 06/09/2024 is normal  Type 2 diabetes mellitus complicated by diabetic neuropathy Long-standing diabetes with neuropathy. Current gabapentin  ineffective. Considering Lyrica but monitoring symptoms for now.  - Consider starting Lyrica 25 mg once daily for neuropathy if symptoms worsen.  But patient wants to wait at this time. - Monitor neuropathy symptoms and reassess treatment efficacy.  Elevated bilirubin and alkaline phosphatase Patient has elevated bilirubin and alkaline phosphatase with normal LFTs at this time  - Continue to monitor at this time.   The patient understands the plans discussed today and is in agreement with them.  He knows to contact our office if he develops concerns prior to his next appointment.  20 minutes of total time was spent for this patient encounter, including preparation,review of records,  face-to-face counseling with the patient and coordination of care, physical exam, and documentation of the encounter.   I, Marijo Sharps, acting as a neurosurgeon for [employer], MD.,have documented all relevant documentation on the behalf of [name], MD,as directed by  [name], MD while in the presence of [name], MD.  I, [name] MD, have reviewed the above documentation for accuracy and completeness, and I agree with the above.    [name], MD  [facility] [place] -  A DEPT OF [name] [address] [place] [address] Dept: [phone] Dept Fax: [phone]   No orders of the defined types were placed in this encounter.     ONCOLOGY HISTORY:   I have reviewed his chart and materials related to his cancer extensively and collaborated history with the patient. Summary of oncologic history is as follows:   Diagnosis: Stage IIb (T2N1) pancreatic adenocarcinoma   -08/20/2018: CT abdomen: Atrophy and ductal dilatation involving the pancreatic tail, with suspected small soft tissue mass in the pancreatic body which could represent pancreatic carcinoma. -08/30/2018: MRI abdomen: Although not definitive, there remains concern of a small hypoenhancing mass at the junction of the pancreatic body and tail associated with atrophy and ductal dilatation in the pancreatic tail. No evidence of metastatic disease.  -10/03/2018: FNA of pancreas body.   Cytology: Adenocarcinoma.  -10/09/2018: CA 19.9: 60 (elevated) -10/17/2018: Initial PET: Tiny focus of hypermetabolism identified in the body of pancreas, adjacent to the abrupt cut off of the main pancreatic duct. No evidence for hypermetabolic metastatic disease in the neck, chest, abdomen, or pelvis. 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  Pathology: Invasive well differentiated ductal adenocarcinoma of the pancreas, 2.8 cm. No perineural or lymphovascular invasion identified. Negative margins. Two of sixteen lymph nodes positive for metastatic carcinoma (2/16). No evidence of malignancy in spleen. Staging: pT2, pN1. -12/30/2018-06/04/2019: 12 cycles of FOLFIRINOX  -05/19/2019: MRI Brain: No evidence of acute intracranial abnormality or intracranial metastatic disease. -03/24/2020: CT AP: Status post distal pancreatectomy and splenectomy. Unchanged low-attenuation lesion of the pancreatic uncinate measuring 1.2 x 0.8 cm. Unchanged low-attenuation lesion near the surgical margin at the pancreatic head neck junction measuring 1.2 x 0.9 cm. Unchanged prominent retroperitoneal lymph nodes.  -2021-12/2023: Patient  under surveillance with imaging showing stable to NED and CA 19.9 within normal range  - 05/13/2024: CT CAP: No evidence of new or progressive disease.  Decreased inflammatory changes along the residual pancreatic head.  Resolution of previously noted lung consolidations with residual indeterminate pulmonary micronodules.   Diagnosis: Therapy related AML   -Diagnosis thought to be contributed to from previous chemotherapy with oxaliplatin , irinotecan  and 5-FU in 2020  -Presentation: worsening cytopenias, prompting nutritional deficiency workup -05/06/2023: CBC diff: low WBC at 0.9 with ANC at 0.4, low RBC at 3.29, HGB at 12.1, MCV at 102.4, and platelets at 54 K.  -05/07/2023: Pathologist smear review showing pancytopenia.  -12/20/2023: CBC diff: low WBC at 2.3 with ANC at 0.5, RBC at 3.12, HGB at 11.4 with MCV at 107.4, and platelets at 82 K. -12/27/2023: SPEP, light chains, and immunofixation were normal. -12/27/2023: NGS Myeloid: No clinically significant variants detected -02/12/2024: Bone Marrow Biopsy.  -Pathology: AML with 20% blasts by aspirate differential, 7% myeloid blasts (CD34, CD33, CD117, HLA-DR) by flow cytometry and 30 to 40% blasts by IHC on the biopsy.   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 Perri [hematology/oncology] at [facility] who recommended treatment initiation with intent to proceed to allogeneic transplant  -03/10/2024: Port inserted by IR at [facility] -03/14/2024-current: Daunorubicin, cytarabine (Vyxeos) induction therapy with Udenyca  given on 05/16/2024, done as inpatient at [facility].  Complications included thrombocytopenia with diffuse petechiae, large tongue hematoma, oral mucositis, rash, and neutropenia  -04/02/2024: Bone Marrow Biopsy.  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   -  04/22/2024: Bone Marrow Biopsy.  -Pathology: Normocellular marrow (30%) with erythroid predominance and no increased blasts. -Chromosome Analysis: 46,XY,t(6;6)(p10;p10)[2]/46,XY[18]    Current Treatment: Consolidation cytarabine infusion  INTERVAL HISTORY:  MICHAIAH HOLSOPPLE is here today for follow up for therapy-related AML and pancreatic adenocarcinoma. Patient is accompanied by his wife today.   Cortavious notes that it is difficult for him to take his pills because he has had difficulty swallowing and that it hurts to swallow. This symptom started on Friday Oct. 24. He also states that he is having trouble sleeping through the night because of his coughing. His wife stated that he experiences these symptoms every time he receives chemotherapy.   He notes that he is using the magic mouthwash twice a day.  He denies fever, chills, night sweats.  I have reviewed the past medical history, past surgical history, social history and family history with the patient and they are unchanged from previous note.  ALLERGIES:  is allergic to demerol  [meperidine  hcl].  MEDICATIONS:  Current Outpatient Medications  Medication Sig Dispense Refill   acyclovir (ZOVIRAX) 400 MG tablet Take 400 mg by mouth.     albuterol  (VENTOLIN  HFA) 108 (90 Base) MCG/ACT inhaler Inhale into the lungs.     aspirin  EC 81 MG tablet Take 1 tablet (81 mg total) by mouth daily with breakfast. 90 tablet 3   atorvastatin  (LIPITOR ) 80 MG tablet Take 1 tablet (80 mg total) by mouth daily at 6 PM. (Patient taking differently: Take 80 mg by mouth daily.) 90 tablet 1   Cholecalciferol  (VITAMIN D3) 25 MCG (1000 UT) capsule Take 1,000 Units by mouth daily.     CINNAMON PO Take by mouth.     Continuous Glucose Receiver (FREESTYLE LIBRE 3 READER) DEVI as directed.     Continuous Glucose Sensor (FREESTYLE LIBRE 3 PLUS SENSOR) MISC SMARTSIG:Every 3 Days     cyanocobalamin  (VITAMIN B12) 1000 MCG tablet Take 1 tablet (1,000 mcg total) by mouth  daily. 90 tablet 3   ECHINACEA COMPLEX PO Take 1,000 mg by mouth daily.     ezetimibe  (ZETIA ) 10 MG tablet Take by mouth.     fluconazole (DIFLUCAN) 200 MG tablet SMARTSIG:1 Tablet(s) By Mouth     furosemide  (LASIX ) 40 MG tablet Take 40 mg by mouth.     gabapentin  (NEURONTIN ) 300 MG capsule Take 1 capsule (300 mg total) by mouth 3 (three) times daily. 270 capsule 3   GVOKE HYPOPEN 2-PACK 1 MG/0.2ML SOAJ      hydrocortisone  (ANUSOL -HC) 2.5 % rectal cream Place 1 Application rectally 2 (two) times daily. For 14 days 30 g 1   hydrOXYzine (ATARAX) 25 MG tablet Take 25 mg by mouth 2 (two) times daily as needed.     insulin  aspart (NOVOLOG  FLEXPEN) 100 UNIT/ML FlexPen Inject 14-20 Units into the skin 3 (three) times daily before meals. (Patient taking differently: Inject 18-24 Units into the skin 3 (three) times daily before meals.) 30 mL 1   Insulin  Pen Needle 32G X 4 MM MISC 1 Device by Does not apply route daily. Use as directed to inject insulin  four times daily 200 each 2   Isavuconazonium Sulfate 186 MG CAPS Take 372 mg by mouth.     levofloxacin (LEVAQUIN) 500 MG tablet Take 500 mg by mouth.     levothyroxine  (SYNTHROID ) 50 MCG tablet TAKE 1 TABLET EVERY DAY 90 tablet 1   linaclotide  (LINZESS ) 290 MCG CAPS capsule Take 1 capsule (290 mcg total) by mouth daily before breakfast.  30 capsule 3   loratadine (CLARITIN) 10 MG tablet Take 10 mg by mouth daily.     metoprolol  succinate (TOPROL -XL) 25 MG 24 hr tablet Take 25 mg by mouth daily.     Multiple Vitamin (MULTIVITAMIN WITH MINERALS) TABS tablet Take 1 tablet by mouth daily. 100 tablet 2   nitroGLYCERIN  (NITROSTAT ) 0.4 MG SL tablet Place 1 tablet (0.4 mg total) under the tongue every 5 (five) minutes as needed for chest pain. 25 tablet 3   Omega-3 1000 MG CAPS Take 1,000 mg by mouth daily.     ONETOUCH VERIO test strip 4 (four) times daily.     pravastatin (PRAVACHOL) 40 MG tablet Take 40 mg by mouth daily.     tamsulosin  (FLOMAX ) 0.4 MG CAPS  capsule Take 1 capsule (0.4 mg total) by mouth daily after supper. 90 capsule 3   TOUJEO  MAX SOLOSTAR 300 UNIT/ML Solostar Pen INJECT 60 UNITS INTO THE SKIN AT BEDTIME. 6 mL 0   traMADol  (ULTRAM ) 50 MG tablet Take 50 mg by mouth 2 (two) times daily as needed.     zinc  sulfate 220 (50 Zn) MG capsule Take 1 capsule (220 mg total) by mouth daily. 90 capsule 3   magic mouthwash w/lidocaine  SOLN Take 10 mLs by mouth 4 (four) times daily as needed for mouth pain. Suspension contains equal amounts of Maalox Extra Strength, nystatin, diphenhydramine  and lidocaine . 250 mL 0   No current facility-administered medications for this visit.   Facility-Administered Medications Ordered in Other Visits  Medication Dose Route Frequency Provider Last Rate Last Admin   0.9 %  sodium chloride  infusion (Manually program via Guardrails IV Fluids)  250 mL Intravenous Continuous Davonna Siad, MD       0.9 %  sodium chloride  infusion   Intravenous Continuous Rogers Hai, MD 20 mL/hr at 12/30/18 1351 New Bag at 12/30/18 1351   0.9 %  sodium chloride  infusion   Intravenous Continuous Katragadda, Sreedhar, MD 20 mL/hr at 12/30/18 1401 New Bag at 12/30/18 1401    REVIEW OF SYSTEMS:   Constitutional: Denies fevers, chills or abnormal weight loss Eyes: Denies blurriness of vision Ears, nose, mouth, throat, and face: Sore throat(+) Respiratory: Denies cough(+), dyspnea or wheezes Cardiovascular: Denies palpitation, chest discomfort or lower extremity swelling Gastrointestinal:  Denies nausea, heartburn or change in bowel habits Skin: Denies abnormal skin rashes Lymphatics: Denies new lymphadenopathy or easy bruising Neurological:Denies numbness, tingling or new weaknesses Behavioral/Psych: Mood is stable, no new changes  All other systems were reviewed with the patient and are negative.   VITALS:  There were no vitals taken for this visit.  Wt Readings from Last 3 Encounters:  06/30/24 205 lb (93 kg)   06/09/24 212 lb 8.4 oz (96.4 kg)  02/18/24 219 lb 5.7 oz (99.5 kg)    There is no height or weight on file to calculate BMI.  Performance status (ECOG): 2 - Symptomatic, <50% confined to bed  PHYSICAL EXAM:   GENERAL:alert, no distress and comfortable SKIN: skin color, texture, turgor are normal, no rashes or significant lesions OROPHARYNX:no exudate, no erythema and lips, buccal mucosa, and tongue normal  NECK: supple, thyroid  normal size, non-tender, without nodularity LYMPH:  no palpable lymphadenopathy in the cervical, axillary or inguinal LUNGS: clear to auscultation and percussion with normal breathing effort HEART: regular rate & rhythm and no murmurs and no lower extremity edema ABDOMEN:abdomen soft, non-tender and normal bowel sounds Musculoskeletal:no cyanosis of digits and no clubbing  NEURO: alert & oriented x  3 with fluent speech  LABORATORY DATA:  I have reviewed the data as listed Lab Results  Component Value Date   WBC 35.3 (H) 06/30/2024   NEUTROABS 33.9 (H) 06/30/2024   HGB 11.2 (L) 06/30/2024   HCT 32.8 (L) 06/30/2024   MCV 95.9 06/30/2024   PLT 154 06/30/2024      Chemistry      Component Value Date/Time   NA 138 06/30/2024 0901   K 4.0 06/30/2024 0901   CL 101 06/30/2024 0901   CO2 22 06/30/2024 0901   BUN 18 06/30/2024 0901   CREATININE 0.80 06/30/2024 0901   CREATININE 0.86 05/17/2020 1329      Component Value Date/Time   CALCIUM  9.1 06/30/2024 0901   ALKPHOS 247 (H) 06/30/2024 0901   AST 23 06/30/2024 0901   AST 16 12/11/2018 1018   ALT 33 06/30/2024 0901   ALT 19 12/11/2018 1018   BILITOT 1.3 (H) 06/30/2024 0901   BILITOT 0.8 12/11/2018 1018       Latest Reference Range & Units 06/30/24 09:01  Iron 45 - 182 ug/dL 785 (H)  UIBC ug/dL 34  TIBC 749 - 549 ug/dL 751 (L)  Saturation Ratios 17.9 - 39.5 % 86 (H)  Ferritin 24 - 336 ng/mL 1,305 (H)  Folate >5.9 ng/mL 15.7  Vitamin B12 180 - 914 pg/mL >4,000 (H)  (H): Data is abnormally  high (L): Data is abnormally low  RADIOGRAPHIC STUDIES: I have personally reviewed the radiological report as below   Impression  1.  No evidence of new or progressive disease. 2.  Decreased inflammatory changes along the residual pancreatic head. 3.  Resolution of the previously noted lung consolidations with residual indeterminate pulmonary micronodules.

## 2024-06-30 NOTE — Patient Instructions (Signed)

## 2024-07-01 ENCOUNTER — Inpatient Hospital Stay

## 2024-07-03 ENCOUNTER — Inpatient Hospital Stay

## 2024-07-03 DIAGNOSIS — C9200 Acute myeloblastic leukemia, not having achieved remission: Secondary | ICD-10-CM

## 2024-07-03 LAB — CBC WITH DIFFERENTIAL/PLATELET
Abs Immature Granulocytes: 0.1 K/uL — ABNORMAL HIGH (ref 0.00–0.07)
Basophils Absolute: 0 K/uL (ref 0.0–0.1)
Basophils Relative: 0 %
Eosinophils Absolute: 0 K/uL (ref 0.0–0.5)
Eosinophils Relative: 1 %
HCT: 29.2 % — ABNORMAL LOW (ref 39.0–52.0)
Hemoglobin: 10 g/dL — ABNORMAL LOW (ref 13.0–17.0)
Lymphocytes Relative: 30 %
Lymphs Abs: 1 K/uL (ref 0.7–4.0)
MCH: 31.7 pg (ref 26.0–34.0)
MCHC: 34.2 g/dL (ref 30.0–36.0)
MCV: 92.7 fL (ref 80.0–100.0)
Metamyelocytes Relative: 2 %
Monocytes Absolute: 0.1 K/uL (ref 0.1–1.0)
Monocytes Relative: 3 %
Neutro Abs: 2 K/uL (ref 1.7–7.7)
Neutrophils Relative %: 64 %
Platelets: 28 K/uL — CL (ref 150–400)
RBC: 3.15 MIL/uL — ABNORMAL LOW (ref 4.22–5.81)
RDW: 21.2 % — ABNORMAL HIGH (ref 11.5–15.5)
WBC: 3.2 K/uL — ABNORMAL LOW (ref 4.0–10.5)
nRBC: 0 % (ref 0.0–0.2)

## 2024-07-03 LAB — COMPREHENSIVE METABOLIC PANEL WITH GFR
ALT: 33 U/L (ref 0–44)
AST: 26 U/L (ref 15–41)
Albumin: 4.1 g/dL (ref 3.5–5.0)
Alkaline Phosphatase: 140 U/L — ABNORMAL HIGH (ref 38–126)
Anion gap: 10 (ref 5–15)
BUN: 17 mg/dL (ref 8–23)
CO2: 25 mmol/L (ref 22–32)
Calcium: 8.8 mg/dL — ABNORMAL LOW (ref 8.9–10.3)
Chloride: 101 mmol/L (ref 98–111)
Creatinine, Ser: 0.6 mg/dL — ABNORMAL LOW (ref 0.61–1.24)
GFR, Estimated: >60 mL/min (ref >60)
Glucose, Bld: 205 mg/dL — ABNORMAL HIGH (ref 70–99)
Potassium: 3.8 mmol/L (ref 3.5–5.1)
Sodium: 136 mmol/L (ref 135–145)
Total Bilirubin: 1.1 mg/dL (ref 0.0–1.2)
Total Protein: 6.8 g/dL (ref 6.5–8.1)

## 2024-07-03 LAB — SAMPLE TO BLOOD BANK

## 2024-07-03 LAB — MAGNESIUM: Magnesium: 2.1 mg/dL (ref 1.7–2.4)

## 2024-07-03 NOTE — Progress Notes (Signed)
 Port flushed with good blood return noted. No bruising or swelling at site. Bandaid applied and patient discharged in satisfactory condition. VVS stable with no signs or symptoms of distressed noted.

## 2024-07-03 NOTE — Patient Instructions (Signed)
 [facility] [place] - A DEPT OF [facility]  Discharge Instructions: Thank you for choosing [facility] to provide your oncology and hematology care.  If you have a lab appointment with the Cancer Center - please note that after April 8th, 2024, all labs will be drawn in the cancer center.  You do not have to check in or register with the main entrance as you have in the past but will complete your check-in in the cancer center.  Wear comfortable clothing and clothing appropriate for easy access to any Portacath or PICC line.   We strive to give you quality time with your provider. You may need to reschedule your appointment if you arrive late (15 or more minutes).  Arriving late affects you and other patients whose appointments are after yours.  Also, if you miss three or more appointments without notifying the office, you may be dismissed from the clinic at the provider's discretion.      For prescription refill requests, have your pharmacy contact our office and allow 72 hours for refills to be completed.    Today you received the following port flush with lab draw, return as scheduled.   To help prevent nausea and vomiting after your treatment, we encourage you to take your nausea medication as directed.  BELOW ARE SYMPTOMS THAT SHOULD BE REPORTED IMMEDIATELY: *FEVER GREATER THAN 100.4 F (38 C) OR HIGHER *CHILLS OR SWEATING *NAUSEA AND VOMITING THAT IS NOT CONTROLLED WITH YOUR NAUSEA MEDICATION *UNUSUAL SHORTNESS OF BREATH *UNUSUAL BRUISING OR BLEEDING *URINARY PROBLEMS (pain or burning when urinating, or frequent urination) *BOWEL PROBLEMS (unusual diarrhea, constipation, pain near the anus) TENDERNESS IN MOUTH AND THROAT WITH OR WITHOUT PRESENCE OF ULCERS (sore throat, sores in mouth, or a toothache) UNUSUAL RASH, SWELLING OR PAIN  UNUSUAL VAGINAL DISCHARGE OR ITCHING   Items with * indicate a potential emergency and should be followed up as  soon as possible or go to the Emergency Department if any problems should occur.  Please show the CHEMOTHERAPY ALERT CARD or IMMUNOTHERAPY ALERT CARD at check-in to the Emergency Department and triage nurse.  Should you have questions after your visit or need to cancel or reschedule your appointment, please contact [facility] [place] - A DEPT OF [facility] [phone]  and follow the prompts.  Office hours are 8:00 a.m. to 4:30 p.m. Monday - Friday. Please note that voicemails left after 4:00 p.m. may not be returned until the following business day.  We are closed weekends and major holidays. You have access to a nurse at all times for urgent questions. Please call the main number to the clinic [phone] and follow the prompts.  For any non-urgent questions, you may also contact your provider using MyChart. We now offer e-Visits for anyone 7 and older to request care online for non-urgent symptoms. For details visit [email or web address].   Also download the MyChart app! Go to the app store, search "MyChart", open the app, select [place], and log in with your MyChart username and password.

## 2024-07-03 NOTE — Progress Notes (Signed)
 CRITICAL VALUE ALERT Critical value received:  platelets  28 Date of notification:  07-03-24 Time of notification: 1130 Critical value read back:  Yes.   Nurse who received alert:  C.Airen Dales RN MD notified time and response:  1131 , Dr. Davonna, no new orders per [facility] guidelines.

## 2024-07-04 ENCOUNTER — Inpatient Hospital Stay

## 2024-07-07 ENCOUNTER — Inpatient Hospital Stay: Attending: Hematology

## 2024-07-07 DIAGNOSIS — C9200 Acute myeloblastic leukemia, not having achieved remission: Secondary | ICD-10-CM | POA: Diagnosis not present

## 2024-07-07 DIAGNOSIS — Z8507 Personal history of malignant neoplasm of pancreas: Secondary | ICD-10-CM | POA: Insufficient documentation

## 2024-07-07 DIAGNOSIS — R748 Abnormal levels of other serum enzymes: Secondary | ICD-10-CM | POA: Insufficient documentation

## 2024-07-07 DIAGNOSIS — E1140 Type 2 diabetes mellitus with diabetic neuropathy, unspecified: Secondary | ICD-10-CM | POA: Insufficient documentation

## 2024-07-07 DIAGNOSIS — K1230 Oral mucositis (ulcerative), unspecified: Secondary | ICD-10-CM | POA: Insufficient documentation

## 2024-07-07 LAB — COMPREHENSIVE METABOLIC PANEL WITH GFR
ALT: 24 U/L (ref 0–44)
AST: 18 U/L (ref 15–41)
Albumin: 4.1 g/dL (ref 3.5–5.0)
Alkaline Phosphatase: 106 U/L (ref 38–126)
Anion gap: 8 (ref 5–15)
BUN: 17 mg/dL (ref 8–23)
CO2: 26 mmol/L (ref 22–32)
Calcium: 8.9 mg/dL (ref 8.9–10.3)
Chloride: 103 mmol/L (ref 98–111)
Creatinine, Ser: 0.64 mg/dL (ref 0.61–1.24)
GFR, Estimated: >60 mL/min (ref >60)
Glucose, Bld: 226 mg/dL — ABNORMAL HIGH (ref 70–99)
Potassium: 4.1 mmol/L (ref 3.5–5.1)
Sodium: 137 mmol/L (ref 135–145)
Total Bilirubin: 0.5 mg/dL (ref 0.0–1.2)
Total Protein: 6.5 g/dL (ref 6.5–8.1)

## 2024-07-07 LAB — SAMPLE TO BLOOD BANK

## 2024-07-07 LAB — MAGNESIUM: Magnesium: 2.1 mg/dL (ref 1.7–2.4)

## 2024-07-07 NOTE — Addendum Note (Signed)
 Addended by: TAMELA LEACH D on: 07/07/2024 11:32 AM   Modules accepted: Orders

## 2024-07-07 NOTE — Patient Instructions (Signed)

## 2024-07-07 NOTE — Progress Notes (Signed)
 CRITICAL VALUE ALERT Critical value received:  Platelets <5.  Date of notification:  07-07-2024 Time of notification: 11:24 am.  Critical value read back:  Yes.   Nurse who received alert:  B.Calypso Hagarty RN.  MD notified time and response:  J.Burns NP @ 11:25 am. [facility] patient. Standing orders followed. Patient will receive 1 unit of plateletes 07-08-2024. Blood bank, scheduling and patient aware. Understanding verbalized.

## 2024-07-07 NOTE — Addendum Note (Signed)
 Addended by: TAMELA LEACH D on: 07/07/2024 11:30 AM   Modules accepted: Orders

## 2024-07-07 NOTE — Progress Notes (Signed)
 Patients port flushed without difficulty.  Good blood return noted with no bruising or swelling noted at site.  Band aid applied.  VSS with discharge and left in satisfactory condition with no s/s of distress noted.

## 2024-07-07 NOTE — Progress Notes (Signed)
 CRITICAL VALUE ALERT Critical value received:  ANC 0.3.  Date of notification:  07-07-2024 Time of notification: 11:24 am Critical value read back:  Yes.   Nurse who received alert:  B.Kippy Melena RN.  MD notified time and response:  J.Burns NP @ 11:24 am.

## 2024-07-07 NOTE — Progress Notes (Signed)
 CRITICAL VALUE ALERT Critical value received:  ANC 0.3.  Date of notification:  07-07-2024 Time of notification: 11:24 am.  Critical value read back:  Yes.   Nurse who received alert:  B.Yaslene Lindamood RN.  MD notified time and response:  J.Burns NP @ 11:32 am.

## 2024-07-08 ENCOUNTER — Inpatient Hospital Stay

## 2024-07-08 DIAGNOSIS — C9200 Acute myeloblastic leukemia, not having achieved remission: Secondary | ICD-10-CM | POA: Diagnosis not present

## 2024-07-08 DIAGNOSIS — T451X5S Adverse effect of antineoplastic and immunosuppressive drugs, sequela: Secondary | ICD-10-CM

## 2024-07-08 LAB — CBC WITH DIFFERENTIAL/PLATELET
Abs Immature Granulocytes: 0 K/uL (ref 0.00–0.07)
Basophils Absolute: 0 K/uL (ref 0.0–0.1)
Basophils Relative: 1 %
Eosinophils Absolute: 0 K/uL (ref 0.0–0.5)
Eosinophils Relative: 0 %
HCT: 26.6 % — ABNORMAL LOW (ref 39.0–52.0)
Hemoglobin: 9.2 g/dL — ABNORMAL LOW (ref 13.0–17.0)
Immature Granulocytes: 0 %
Lymphocytes Relative: 77 %
Lymphs Abs: 1.4 K/uL (ref 0.7–4.0)
MCH: 32.3 pg (ref 26.0–34.0)
MCHC: 34.6 g/dL (ref 30.0–36.0)
MCV: 93.3 fL (ref 80.0–100.0)
Monocytes Absolute: 0.1 K/uL (ref 0.1–1.0)
Monocytes Relative: 8 %
Neutro Abs: 0.3 K/uL — CL (ref 1.7–7.7)
Neutrophils Relative %: 14 %
Platelets: <5 K/uL — CL (ref 150–400)
RBC: 2.85 MIL/uL — ABNORMAL LOW (ref 4.22–5.81)
RDW: 20.1 % — ABNORMAL HIGH (ref 11.5–15.5)
WBC: 1.8 K/uL — ABNORMAL LOW (ref 4.0–10.5)
nRBC: 0 % (ref 0.0–0.2)

## 2024-07-08 MED ORDER — SODIUM CHLORIDE 0.9% IV SOLUTION
250.0000 mL | INTRAVENOUS | Status: DC
  Administered 2024-07-08: 250 mL via INTRAVENOUS

## 2024-07-08 MED ORDER — ACETAMINOPHEN 325 MG PO TABS
650.0000 mg | ORAL_TABLET | Freq: Once | ORAL | Status: AC
  Administered 2024-07-08: 650 mg via ORAL
  Filled 2024-07-08: qty 2

## 2024-07-08 MED ORDER — DIPHENHYDRAMINE HCL 25 MG PO CAPS
25.0000 mg | ORAL_CAPSULE | Freq: Once | ORAL | Status: AC
  Administered 2024-07-08: 25 mg via ORAL
  Filled 2024-07-08: qty 1

## 2024-07-08 NOTE — Progress Notes (Signed)
 Pt presents to cancer center for PLT transfusion. Pt tolerated PLT transfusion well with no signs of complications. transfusion given per protocol. Vitals stable pre, during, and post transfusion. RN educated pt on the importance of notifying the clinic if any complications occur and when to seek emergency care. Pt verbalized understanding and all questions answered at this time. All follow ups as scheduled.   Mark Foster

## 2024-07-09 LAB — BPAM PLATELET PHERESIS
Blood Product Expiration Date: 202511052359
ISSUE DATE / TIME: 202511041116
Unit Type and Rh: 202511052359
Unit Type and Rh: 5100

## 2024-07-09 LAB — PREPARE PLATELET PHERESIS: Unit division: 0

## 2024-07-10 ENCOUNTER — Inpatient Hospital Stay

## 2024-07-10 DIAGNOSIS — C9200 Acute myeloblastic leukemia, not having achieved remission: Secondary | ICD-10-CM

## 2024-07-10 LAB — CBC WITH DIFFERENTIAL/PLATELET
Abs Immature Granulocytes: 0.02 K/uL (ref 0.00–0.07)
Basophils Absolute: 0 K/uL (ref 0.0–0.1)
Basophils Relative: 0 %
Eosinophils Absolute: 0 K/uL (ref 0.0–0.5)
Eosinophils Relative: 0 %
HCT: 24.7 % — ABNORMAL LOW (ref 39.0–52.0)
Hemoglobin: 8.6 g/dL — ABNORMAL LOW (ref 13.0–17.0)
Immature Granulocytes: 1 %
Lymphocytes Relative: 64 %
Lymphs Abs: 2.3 K/uL (ref 0.7–4.0)
MCH: 32.8 pg (ref 26.0–34.0)
MCHC: 34.8 g/dL (ref 30.0–36.0)
MCV: 94.3 fL (ref 80.0–100.0)
Monocytes Absolute: 0.4 K/uL (ref 0.1–1.0)
Monocytes Relative: 11 %
Neutro Abs: 0.9 K/uL — ABNORMAL LOW (ref 1.7–7.7)
Neutrophils Relative %: 24 %
Platelets: 12 K/uL — CL (ref 150–400)
RBC: 2.62 MIL/uL — ABNORMAL LOW (ref 4.22–5.81)
RDW: 20 % — ABNORMAL HIGH (ref 11.5–15.5)
WBC: 3.6 K/uL — ABNORMAL LOW (ref 4.0–10.5)
nRBC: 0.8 % — ABNORMAL HIGH (ref 0.0–0.2)

## 2024-07-10 LAB — COMPREHENSIVE METABOLIC PANEL WITH GFR
ALT: 19 U/L (ref 0–44)
AST: 19 U/L (ref 15–41)
Albumin: 3.9 g/dL (ref 3.5–5.0)
Alkaline Phosphatase: 105 U/L (ref 38–126)
Anion gap: 10 (ref 5–15)
BUN: 17 mg/dL (ref 8–23)
CO2: 27 mmol/L (ref 22–32)
Calcium: 8.6 mg/dL — ABNORMAL LOW (ref 8.9–10.3)
Chloride: 101 mmol/L (ref 98–111)
Creatinine, Ser: 0.96 mg/dL (ref 0.61–1.24)
GFR, Estimated: >60 mL/min (ref >60)
Glucose, Bld: 142 mg/dL — ABNORMAL HIGH (ref 70–99)
Potassium: 3.7 mmol/L (ref 3.5–5.1)
Sodium: 137 mmol/L (ref 135–145)
Total Bilirubin: 0.3 mg/dL (ref 0.0–1.2)
Total Protein: 6.5 g/dL (ref 6.5–8.1)

## 2024-07-10 LAB — SAMPLE TO BLOOD BANK

## 2024-07-10 LAB — PREPARE RBC (CROSSMATCH)

## 2024-07-10 LAB — MAGNESIUM: Magnesium: 2 mg/dL (ref 1.7–2.4)

## 2024-07-10 NOTE — Addendum Note (Signed)
 Addended by: TAMELA LEACH D on: 07/10/2024 02:44 PM   Modules accepted: Orders

## 2024-07-10 NOTE — Patient Instructions (Signed)
 [facility] [place] - A DEPT OF [facility]  Discharge Instructions: Thank you for choosing [facility] to provide your oncology and hematology care.  If you have a lab appointment with the Cancer Center - please note that after April 8th, 2024, all labs will be drawn in the cancer center.  You do not have to check in or register with the main entrance as you have in the past but will complete your check-in in the cancer center.  Wear comfortable clothing and clothing appropriate for easy access to any Portacath or PICC line.   We strive to give you quality time with your provider. You may need to reschedule your appointment if you arrive late (15 or more minutes).  Arriving late affects you and other patients whose appointments are after yours.  Also, if you miss three or more appointments without notifying the office, you may be dismissed from the clinic at the provider's discretion.      For prescription refill requests, have your pharmacy contact our office and allow 72 hours for refills to be completed.    Today you received the following port flush with labs, return as scheduled.   To help prevent nausea and vomiting after your treatment, we encourage you to take your nausea medication as directed.  BELOW ARE SYMPTOMS THAT SHOULD BE REPORTED IMMEDIATELY: *FEVER GREATER THAN 100.4 F (38 C) OR HIGHER *CHILLS OR SWEATING *NAUSEA AND VOMITING THAT IS NOT CONTROLLED WITH YOUR NAUSEA MEDICATION *UNUSUAL SHORTNESS OF BREATH *UNUSUAL BRUISING OR BLEEDING *URINARY PROBLEMS (pain or burning when urinating, or frequent urination) *BOWEL PROBLEMS (unusual diarrhea, constipation, pain near the anus) TENDERNESS IN MOUTH AND THROAT WITH OR WITHOUT PRESENCE OF ULCERS (sore throat, sores in mouth, or a toothache) UNUSUAL RASH, SWELLING OR PAIN  UNUSUAL VAGINAL DISCHARGE OR ITCHING   Items with * indicate a potential emergency and should be followed up as soon as  possible or go to the Emergency Department if any problems should occur.  Please show the CHEMOTHERAPY ALERT CARD or IMMUNOTHERAPY ALERT CARD at check-in to the Emergency Department and triage nurse.  Should you have questions after your visit or need to cancel or reschedule your appointment, please contact [facility] [place] - A DEPT OF Eligha Bridegroom [facility] [phone]  and follow the prompts.  Office hours are 8:00 a.m. to 4:30 p.m. Monday - Friday. Please note that voicemails left after 4:00 p.m. may not be returned until the following business day.  We are closed weekends and major holidays. You have access to a nurse at all times for urgent questions. Please call the main number to the clinic [phone] and follow the prompts.  For any non-urgent questions, you may also contact your provider using MyChart. We now offer e-Visits for anyone 52 and older to request care online for non-urgent symptoms. For details visit [email or web address].   Also download the MyChart app! Go to the app store, search "MyChart", open the app, select [place], and log in with your MyChart username and password.

## 2024-07-10 NOTE — Progress Notes (Signed)
 Port flushed with good blood return noted. No bruising or swelling at site. Bandaid applied and patient discharged in satisfactory condition. VVS stable with no signs or symptoms of distressed noted.

## 2024-07-10 NOTE — Progress Notes (Signed)
 CRITICAL VALUE ALERT Critical value received:  Platelets.  Date of notification:  07-10-2024 Time of notification: 14:24 pm.  Critical value read back:  Yes.   Nurse who received alert:  B.Deema Juncaj RN.  MD notified time and response:  Dr. Davonna @ 14:25 . [facility] standing orders followed. Patient will receive 1 unit of platelets on 07-11-2024 and 2 units of blood per [facility] standing orders. Patient symptomatic. Blood bank, scheduling and patient aware.

## 2024-07-11 ENCOUNTER — Inpatient Hospital Stay

## 2024-07-11 DIAGNOSIS — T451X5S Adverse effect of antineoplastic and immunosuppressive drugs, sequela: Secondary | ICD-10-CM

## 2024-07-11 DIAGNOSIS — C9200 Acute myeloblastic leukemia, not having achieved remission: Secondary | ICD-10-CM | POA: Diagnosis not present

## 2024-07-11 MED ORDER — SODIUM CHLORIDE 0.9% IV SOLUTION
250.0000 mL | INTRAVENOUS | Status: DC
  Administered 2024-07-11: 100 mL via INTRAVENOUS

## 2024-07-11 MED ORDER — DIPHENHYDRAMINE HCL 25 MG PO CAPS: 25.0000 mg | ORAL_CAPSULE | Freq: Once | ORAL | Status: DC

## 2024-07-11 MED ORDER — ACETAMINOPHEN 325 MG PO TABS: 650.0000 mg | ORAL_TABLET | Freq: Once | ORAL | Status: DC

## 2024-07-11 NOTE — Patient Instructions (Signed)
 [facility] [place] - A DEPT OF [facility]  Discharge Instructions: Thank you for choosing [facility] to provide your oncology and hematology care.  If you have a lab appointment with the Cancer Center - please note that after April 8th, 2024, all labs will be drawn in the cancer center.  You do not have to check in or register with the main entrance as you have in the past but will complete your check-in in the cancer center.  Wear comfortable clothing and clothing appropriate for easy access to any Portacath or PICC line.   We strive to give you quality time with your provider. You may need to reschedule your appointment if you arrive late (15 or more minutes).  Arriving late affects you and other patients whose appointments are after yours.  Also, if you miss three or more appointments without notifying the office, you may be dismissed from the clinic at the provider's discretion.      For prescription refill requests, have your pharmacy contact our office and allow 72 hours for refills to be completed.    Today you received the following chemotherapy and/or immunotherapy agents 2 UPRBC, 1 U Platelets      To help prevent nausea and vomiting after your treatment, we encourage you to take your nausea medication as directed.  BELOW ARE SYMPTOMS THAT SHOULD BE REPORTED IMMEDIATELY: *FEVER GREATER THAN 100.4 F (38 C) OR HIGHER *CHILLS OR SWEATING *NAUSEA AND VOMITING THAT IS NOT CONTROLLED WITH YOUR NAUSEA MEDICATION *UNUSUAL SHORTNESS OF BREATH *UNUSUAL BRUISING OR BLEEDING *URINARY PROBLEMS (pain or burning when urinating, or frequent urination) *BOWEL PROBLEMS (unusual diarrhea, constipation, pain near the anus) TENDERNESS IN MOUTH AND THROAT WITH OR WITHOUT PRESENCE OF ULCERS (sore throat, sores in mouth, or a toothache) UNUSUAL RASH, SWELLING OR PAIN  UNUSUAL VAGINAL DISCHARGE OR ITCHING   Items with * indicate a potential emergency and should  be followed up as soon as possible or go to the Emergency Department if any problems should occur.  Please show the CHEMOTHERAPY ALERT CARD or IMMUNOTHERAPY ALERT CARD at check-in to the Emergency Department and triage nurse.  Should you have questions after your visit or need to cancel or reschedule your appointment, please contact [facility]ER CTR  - A DEPT OF [name] [phone]  and follow the prompts.  Office hours are 8:00 a.m. to 4:30 p.m. Monday - Friday. Please note that voicemails left after 4:00 p.m. may not be returned until the following business day.  We are closed weekends and major holidays. You have access to a nurse at all times for urgent questions. Please call the main number to the clinic [phone] and follow the prompts.  For any non-urgent questions, you may also contact your provider using MyChart. We now offer e-Visits for anyone 35 and older to request care online for non-urgent symptoms. For details visit [email or web address].   Also download the MyChart app! Go to the app store, search MyChart, open the app, select [place], and log in with your MyChart username and password.

## 2024-07-11 NOTE — Progress Notes (Signed)
 Patient presents today for 1 Unit of Platelets and 2UPRBC per providers order.  Vital signs WNL.  Patient has no new complaints at this time.   Stable during transfusion without adverse affects.  Vital signs stable.  No complaints at this time.  Discharge from clinic ambulatory in stable condition.  Alert and oriented X 3.  Follow up with [facility] as scheduled.

## 2024-07-12 LAB — BPAM RBC
Blood Product Expiration Date: 202511242359
Blood Product Expiration Date: 202511302359
ISSUE DATE / TIME: 202511070906
ISSUE DATE / TIME: 202511071040
Unit Type and Rh: 6200
Unit Type and Rh: 6200

## 2024-07-12 LAB — TYPE AND SCREEN
ABO/RH(D): A POS
Antibody Screen: NEGATIVE
Unit division: 0
Unit division: 0

## 2024-07-12 LAB — PREPARE PLATELET PHERESIS: Unit division: 0

## 2024-07-12 LAB — BPAM PLATELET PHERESIS
Blood Product Expiration Date: 202511092359
ISSUE DATE / TIME: 202511071212
Unit Type and Rh: 5100

## 2024-07-14 ENCOUNTER — Inpatient Hospital Stay

## 2024-07-14 DIAGNOSIS — T451X5S Adverse effect of antineoplastic and immunosuppressive drugs, sequela: Secondary | ICD-10-CM

## 2024-07-14 DIAGNOSIS — C9200 Acute myeloblastic leukemia, not having achieved remission: Secondary | ICD-10-CM | POA: Diagnosis not present

## 2024-07-14 LAB — CBC WITH DIFFERENTIAL/PLATELET
Abs Immature Granulocytes: 0.01 K/uL (ref 0.00–0.07)
Basophils Absolute: 0 K/uL (ref 0.0–0.1)
Basophils Relative: 0 %
Eosinophils Absolute: 0 K/uL (ref 0.0–0.5)
Eosinophils Relative: 0 %
HCT: 32.1 % — ABNORMAL LOW (ref 39.0–52.0)
Hemoglobin: 11.3 g/dL — ABNORMAL LOW (ref 13.0–17.0)
Immature Granulocytes: 0 %
Lymphocytes Relative: 52 %
Lymphs Abs: 2 K/uL (ref 0.7–4.0)
MCH: 32.4 pg (ref 26.0–34.0)
MCHC: 35.2 g/dL (ref 30.0–36.0)
MCV: 92 fL (ref 80.0–100.0)
Monocytes Absolute: 0.8 K/uL (ref 0.1–1.0)
Monocytes Relative: 21 %
Neutro Abs: 1.1 K/uL — ABNORMAL LOW (ref 1.7–7.7)
Neutrophils Relative %: 27 %
Platelets: 45 K/uL — ABNORMAL LOW (ref 150–400)
RBC: 3.49 MIL/uL — ABNORMAL LOW (ref 4.22–5.81)
RDW: 19 % — ABNORMAL HIGH (ref 11.5–15.5)
WBC: 3.9 K/uL — ABNORMAL LOW (ref 4.0–10.5)
nRBC: 1.3 % — ABNORMAL HIGH (ref 0.0–0.2)

## 2024-07-14 LAB — COMPREHENSIVE METABOLIC PANEL WITH GFR
ALT: 17 U/L (ref 0–44)
AST: 20 U/L (ref 15–41)
Albumin: 4.1 g/dL (ref 3.5–5.0)
Alkaline Phosphatase: 102 U/L (ref 38–126)
Anion gap: 11 (ref 5–15)
BUN: 10 mg/dL (ref 8–23)
CO2: 24 mmol/L (ref 22–32)
Calcium: 9.1 mg/dL (ref 8.9–10.3)
Chloride: 102 mmol/L (ref 98–111)
Creatinine, Ser: 0.67 mg/dL (ref 0.61–1.24)
GFR, Estimated: >60 mL/min (ref >60)
Glucose, Bld: 187 mg/dL — ABNORMAL HIGH (ref 70–99)
Potassium: 3.8 mmol/L (ref 3.5–5.1)
Sodium: 137 mmol/L (ref 135–145)
Total Bilirubin: 0.4 mg/dL (ref 0.0–1.2)
Total Protein: 6.7 g/dL (ref 6.5–8.1)

## 2024-07-14 LAB — MAGNESIUM: Magnesium: 2 mg/dL (ref 1.7–2.4)

## 2024-07-14 LAB — SAMPLE TO BLOOD BANK

## 2024-07-14 NOTE — Progress Notes (Signed)
 Patients port flushed without difficulty.  Good blood return noted with no bruising or swelling noted at site.  Labs drawn per orders.  VSS with discharge and left in satisfactory condition with no s/s of distress noted. All follow ups as scheduled.       Mark Foster

## 2024-07-14 NOTE — Progress Notes (Signed)
 Called pt to let him know his hemoglobin and platelets are stable and no need for any transfusions.

## 2024-07-17 ENCOUNTER — Inpatient Hospital Stay

## 2024-07-17 DIAGNOSIS — C9200 Acute myeloblastic leukemia, not having achieved remission: Secondary | ICD-10-CM | POA: Diagnosis not present

## 2024-07-17 LAB — CBC WITH DIFFERENTIAL/PLATELET
Abs Immature Granulocytes: 0.01 K/uL (ref 0.00–0.07)
Basophils Absolute: 0 K/uL (ref 0.0–0.1)
Basophils Relative: 0 %
Eosinophils Absolute: 0 K/uL (ref 0.0–0.5)
Eosinophils Relative: 0 %
HCT: 34.5 % — ABNORMAL LOW (ref 39.0–52.0)
Hemoglobin: 12 g/dL — ABNORMAL LOW (ref 13.0–17.0)
Immature Granulocytes: 0 %
Lymphocytes Relative: 49 %
Lymphs Abs: 2.5 K/uL (ref 0.7–4.0)
MCH: 32.4 pg (ref 26.0–34.0)
MCHC: 34.8 g/dL (ref 30.0–36.0)
MCV: 93.2 fL (ref 80.0–100.0)
Monocytes Absolute: 1.4 K/uL — ABNORMAL HIGH (ref 0.1–1.0)
Monocytes Relative: 27 %
Neutro Abs: 1.2 K/uL — ABNORMAL LOW (ref 1.7–7.7)
Neutrophils Relative %: 24 %
Platelets: 74 K/uL — ABNORMAL LOW (ref 150–400)
RBC: 3.7 MIL/uL — ABNORMAL LOW (ref 4.22–5.81)
RDW: 19.9 % — ABNORMAL HIGH (ref 11.5–15.5)
WBC: 5.2 K/uL (ref 4.0–10.5)
nRBC: 0.6 % — ABNORMAL HIGH (ref 0.0–0.2)

## 2024-07-17 LAB — COMPREHENSIVE METABOLIC PANEL WITH GFR
ALT: 18 U/L (ref 0–44)
AST: 24 U/L (ref 15–41)
Albumin: 4.6 g/dL (ref 3.5–5.0)
Alkaline Phosphatase: 104 U/L (ref 38–126)
Anion gap: 11 (ref 5–15)
BUN: 12 mg/dL (ref 8–23)
CO2: 27 mmol/L (ref 22–32)
Calcium: 9.5 mg/dL (ref 8.9–10.3)
Chloride: 102 mmol/L (ref 98–111)
Creatinine, Ser: 0.76 mg/dL (ref 0.61–1.24)
GFR, Estimated: >60 mL/min (ref >60)
Glucose, Bld: 66 mg/dL — ABNORMAL LOW (ref 70–99)
Potassium: 3.5 mmol/L (ref 3.5–5.1)
Sodium: 140 mmol/L (ref 135–145)
Total Bilirubin: 0.4 mg/dL (ref 0.0–1.2)
Total Protein: 7.4 g/dL (ref 6.5–8.1)

## 2024-07-17 LAB — MAGNESIUM: Magnesium: 2.2 mg/dL (ref 1.7–2.4)

## 2024-07-17 LAB — SAMPLE TO BLOOD BANK

## 2024-07-17 NOTE — Progress Notes (Signed)
 Port flushed with good blood return noted. No bruising or swelling at site. Bandaid applied and patient discharged in satisfactory condition. VVS stable with no signs or symptoms of distressed noted.

## 2024-07-17 NOTE — Progress Notes (Signed)
 Called pt to let him know he did not need any blood transfusions tomorrow.

## 2024-07-21 ENCOUNTER — Inpatient Hospital Stay

## 2024-07-21 DIAGNOSIS — C9200 Acute myeloblastic leukemia, not having achieved remission: Secondary | ICD-10-CM | POA: Diagnosis not present

## 2024-07-21 LAB — COMPREHENSIVE METABOLIC PANEL WITH GFR
ALT: 13 U/L (ref 0–44)
AST: 23 U/L (ref 15–41)
Albumin: 4 g/dL (ref 3.5–5.0)
Alkaline Phosphatase: 87 U/L (ref 38–126)
Anion gap: 11 (ref 5–15)
BUN: 14 mg/dL (ref 8–23)
CO2: 24 mmol/L (ref 22–32)
Calcium: 9 mg/dL (ref 8.9–10.3)
Chloride: 104 mmol/L (ref 98–111)
Creatinine, Ser: 0.71 mg/dL (ref 0.61–1.24)
GFR, Estimated: >60 mL/min (ref >60)
Glucose, Bld: 129 mg/dL — ABNORMAL HIGH (ref 70–99)
Potassium: 3.6 mmol/L (ref 3.5–5.1)
Sodium: 139 mmol/L (ref 135–145)
Total Bilirubin: 0.3 mg/dL (ref 0.0–1.2)
Total Protein: 6.8 g/dL (ref 6.5–8.1)

## 2024-07-21 LAB — CBC WITH DIFFERENTIAL/PLATELET
Abs Immature Granulocytes: 0.01 K/uL (ref 0.00–0.07)
Basophils Absolute: 0 K/uL (ref 0.0–0.1)
Basophils Relative: 0 %
Eosinophils Absolute: 0 K/uL (ref 0.0–0.5)
Eosinophils Relative: 0 %
HCT: 33.3 % — ABNORMAL LOW (ref 39.0–52.0)
Hemoglobin: 11.3 g/dL — ABNORMAL LOW (ref 13.0–17.0)
Immature Granulocytes: 0 %
Lymphocytes Relative: 48 %
Lymphs Abs: 2 K/uL (ref 0.7–4.0)
MCH: 32.3 pg (ref 26.0–34.0)
MCHC: 33.9 g/dL (ref 30.0–36.0)
MCV: 95.1 fL (ref 80.0–100.0)
Monocytes Absolute: 1.2 K/uL — ABNORMAL HIGH (ref 0.1–1.0)
Monocytes Relative: 28 %
Neutro Abs: 1 K/uL — ABNORMAL LOW (ref 1.7–7.7)
Neutrophils Relative %: 24 %
Platelets: 110 K/uL — ABNORMAL LOW (ref 150–400)
RBC: 3.5 MIL/uL — ABNORMAL LOW (ref 4.22–5.81)
RDW: 20.6 % — ABNORMAL HIGH (ref 11.5–15.5)
WBC: 4.2 K/uL (ref 4.0–10.5)
nRBC: 0.5 % — ABNORMAL HIGH (ref 0.0–0.2)

## 2024-07-21 LAB — MAGNESIUM: Magnesium: 2.1 mg/dL (ref 1.7–2.4)

## 2024-07-21 LAB — SAMPLE TO BLOOD BANK

## 2024-07-24 ENCOUNTER — Telehealth (HOSPITAL_COMMUNITY): Payer: Self-pay

## 2024-07-24 ENCOUNTER — Inpatient Hospital Stay

## 2024-07-24 DIAGNOSIS — T451X5S Adverse effect of antineoplastic and immunosuppressive drugs, sequela: Secondary | ICD-10-CM

## 2024-07-24 DIAGNOSIS — C9200 Acute myeloblastic leukemia, not having achieved remission: Secondary | ICD-10-CM | POA: Diagnosis not present

## 2024-07-24 LAB — CBC WITH DIFFERENTIAL/PLATELET
Abs Immature Granulocytes: 0.01 K/uL (ref 0.00–0.07)
Basophils Absolute: 0 K/uL (ref 0.0–0.1)
Basophils Relative: 1 %
Eosinophils Absolute: 0 K/uL (ref 0.0–0.5)
Eosinophils Relative: 0 %
HCT: 35 % — ABNORMAL LOW (ref 39.0–52.0)
Hemoglobin: 12 g/dL — ABNORMAL LOW (ref 13.0–17.0)
Immature Granulocytes: 0 %
Lymphocytes Relative: 45 %
Lymphs Abs: 2.1 K/uL (ref 0.7–4.0)
MCH: 32.8 pg (ref 26.0–34.0)
MCHC: 34.3 g/dL (ref 30.0–36.0)
MCV: 95.6 fL (ref 80.0–100.0)
Monocytes Absolute: 1.4 K/uL — ABNORMAL HIGH (ref 0.1–1.0)
Monocytes Relative: 30 %
Neutro Abs: 1.1 K/uL — ABNORMAL LOW (ref 1.7–7.7)
Neutrophils Relative %: 24 %
Platelets: 155 K/uL (ref 150–400)
RBC: 3.66 MIL/uL — ABNORMAL LOW (ref 4.22–5.81)
RDW: 21.6 % — ABNORMAL HIGH (ref 11.5–15.5)
WBC: 4.6 K/uL (ref 4.0–10.5)
nRBC: 0.9 % — ABNORMAL HIGH (ref 0.0–0.2)

## 2024-07-24 LAB — COMPREHENSIVE METABOLIC PANEL WITH GFR
ALT: 17 U/L (ref 0–44)
AST: 24 U/L (ref 15–41)
Albumin: 4.2 g/dL (ref 3.5–5.0)
Alkaline Phosphatase: 83 U/L (ref 38–126)
Anion gap: 10 (ref 5–15)
BUN: 11 mg/dL (ref 8–23)
CO2: 25 mmol/L (ref 22–32)
Calcium: 9.2 mg/dL (ref 8.9–10.3)
Chloride: 105 mmol/L (ref 98–111)
Creatinine, Ser: 0.68 mg/dL (ref 0.61–1.24)
GFR, Estimated: >60 mL/min (ref >60)
Glucose, Bld: 69 mg/dL — ABNORMAL LOW (ref 70–99)
Potassium: 3.7 mmol/L (ref 3.5–5.1)
Sodium: 140 mmol/L (ref 135–145)
Total Bilirubin: 0.3 mg/dL (ref 0.0–1.2)
Total Protein: 7.1 g/dL (ref 6.5–8.1)

## 2024-07-24 LAB — MAGNESIUM: Magnesium: 2 mg/dL (ref 1.7–2.4)

## 2024-07-24 LAB — SAMPLE TO BLOOD BANK

## 2024-07-24 NOTE — Telephone Encounter (Signed)
 Called and left voicemail and informed patient no blood products or platelets needed per [facility] parameters. [phone] number left on voicemail if patient has any questions or concerns.

## 2024-07-24 NOTE — Progress Notes (Signed)
 Port flushed with good blood return noted. No bruising or swelling at site. Bandaid applied and patient discharged in satisfactory condition. VVS stable with no signs or symptoms of distressed noted.

## 2024-07-24 NOTE — Patient Instructions (Signed)
 [facility] [place] - A DEPT OF [facility]  Discharge Instructions: Thank you for choosing [facility] to provide your oncology and hematology care.  If you have a lab appointment with the Cancer Center - please note that after April 8th, 2024, all labs will be drawn in the cancer center.  You do not have to check in or register with the main entrance as you have in the past but will complete your check-in in the cancer center.  Wear comfortable clothing and clothing appropriate for easy access to any Portacath or PICC line.   We strive to give you quality time with your provider. You may need to reschedule your appointment if you arrive late (15 or more minutes).  Arriving late affects you and other patients whose appointments are after yours.  Also, if you miss three or more appointments without notifying the office, you may be dismissed from the clinic at the provider's discretion.      For prescription refill requests, have your pharmacy contact our office and allow 72 hours for refills to be completed.    Today you received the following port flush with labs, return as scheduled.   To help prevent nausea and vomiting after your treatment, we encourage you to take your nausea medication as directed.  BELOW ARE SYMPTOMS THAT SHOULD BE REPORTED IMMEDIATELY: *FEVER GREATER THAN 100.4 F (38 C) OR HIGHER *CHILLS OR SWEATING *NAUSEA AND VOMITING THAT IS NOT CONTROLLED WITH YOUR NAUSEA MEDICATION *UNUSUAL SHORTNESS OF BREATH *UNUSUAL BRUISING OR BLEEDING *URINARY PROBLEMS (pain or burning when urinating, or frequent urination) *BOWEL PROBLEMS (unusual diarrhea, constipation, pain near the anus) TENDERNESS IN MOUTH AND THROAT WITH OR WITHOUT PRESENCE OF ULCERS (sore throat, sores in mouth, or a toothache) UNUSUAL RASH, SWELLING OR PAIN  UNUSUAL VAGINAL DISCHARGE OR ITCHING   Items with * indicate a potential emergency and should be followed up as soon as  possible or go to the Emergency Department if any problems should occur.  Please show the CHEMOTHERAPY ALERT CARD or IMMUNOTHERAPY ALERT CARD at check-in to the Emergency Department and triage nurse.  Should you have questions after your visit or need to cancel or reschedule your appointment, please contact [facility] [place] - A DEPT OF Eligha Bridegroom [facility] [phone]  and follow the prompts.  Office hours are 8:00 a.m. to 4:30 p.m. Monday - Friday. Please note that voicemails left after 4:00 p.m. may not be returned until the following business day.  We are closed weekends and major holidays. You have access to a nurse at all times for urgent questions. Please call the main number to the clinic [phone] and follow the prompts.  For any non-urgent questions, you may also contact your provider using MyChart. We now offer e-Visits for anyone 52 and older to request care online for non-urgent symptoms. For details visit [email or web address].   Also download the MyChart app! Go to the app store, search "MyChart", open the app, select [place], and log in with your MyChart username and password.

## 2024-07-29 ENCOUNTER — Inpatient Hospital Stay: Admitting: Oncology

## 2024-07-29 ENCOUNTER — Inpatient Hospital Stay

## 2024-07-29 DIAGNOSIS — K1230 Oral mucositis (ulcerative), unspecified: Secondary | ICD-10-CM

## 2024-07-29 DIAGNOSIS — C9200 Acute myeloblastic leukemia, not having achieved remission: Secondary | ICD-10-CM

## 2024-07-29 DIAGNOSIS — Z8507 Personal history of malignant neoplasm of pancreas: Secondary | ICD-10-CM

## 2024-07-29 DIAGNOSIS — E1140 Type 2 diabetes mellitus with diabetic neuropathy, unspecified: Secondary | ICD-10-CM | POA: Diagnosis not present

## 2024-07-29 DIAGNOSIS — R748 Abnormal levels of other serum enzymes: Secondary | ICD-10-CM

## 2024-07-29 LAB — CBC WITH DIFFERENTIAL/PLATELET
Abs Immature Granulocytes: 0.02 K/uL (ref 0.00–0.07)
Basophils Absolute: 0 K/uL (ref 0.0–0.1)
Basophils Relative: 1 %
Eosinophils Absolute: 0 K/uL (ref 0.0–0.5)
Eosinophils Relative: 0 %
HCT: 33.3 % — ABNORMAL LOW (ref 39.0–52.0)
Hemoglobin: 11.4 g/dL — ABNORMAL LOW (ref 13.0–17.0)
Immature Granulocytes: 0 %
Lymphocytes Relative: 33 %
Lymphs Abs: 1.9 K/uL (ref 0.7–4.0)
MCH: 33.4 pg (ref 26.0–34.0)
MCHC: 34.2 g/dL (ref 30.0–36.0)
MCV: 97.7 fL (ref 80.0–100.0)
Monocytes Absolute: 1.9 K/uL — ABNORMAL HIGH (ref 0.1–1.0)
Monocytes Relative: 32 %
Neutro Abs: 2 K/uL (ref 1.7–7.7)
Neutrophils Relative %: 34 %
Platelets: 197 K/uL (ref 150–400)
RBC: 3.41 MIL/uL — ABNORMAL LOW (ref 4.22–5.81)
RDW: 23.2 % — ABNORMAL HIGH (ref 11.5–15.5)
WBC: 5.9 K/uL (ref 4.0–10.5)
nRBC: 0.9 % — ABNORMAL HIGH (ref 0.0–0.2)

## 2024-07-29 LAB — COMPREHENSIVE METABOLIC PANEL WITH GFR
ALT: 12 U/L (ref 0–44)
AST: 20 U/L (ref 15–41)
Albumin: 4 g/dL (ref 3.5–5.0)
Alkaline Phosphatase: 72 U/L (ref 38–126)
Anion gap: 8 (ref 5–15)
BUN: 12 mg/dL (ref 8–23)
CO2: 27 mmol/L (ref 22–32)
Calcium: 8.8 mg/dL — ABNORMAL LOW (ref 8.9–10.3)
Chloride: 102 mmol/L (ref 98–111)
Creatinine, Ser: 0.89 mg/dL (ref 0.61–1.24)
GFR, Estimated: >60 mL/min (ref >60)
Glucose, Bld: 224 mg/dL — ABNORMAL HIGH (ref 70–99)
Potassium: 4 mmol/L (ref 3.5–5.1)
Sodium: 136 mmol/L (ref 135–145)
Total Bilirubin: 0.4 mg/dL (ref 0.0–1.2)
Total Protein: 6.4 g/dL — ABNORMAL LOW (ref 6.5–8.1)

## 2024-07-29 LAB — MAGNESIUM: Magnesium: 2 mg/dL (ref 1.7–2.4)

## 2024-07-29 LAB — SAMPLE TO BLOOD BANK

## 2024-07-29 NOTE — Progress Notes (Signed)
 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  Oncology history below. Received induction chemotherapy with daunorubicin  and cytarabine  at [facility] and is currently receiving consolidation cytarabine .   He is getting transfusion support and labs here twice a week as needed.    -Labs reviewed today: CMP: Potassium: 4.0, creatinine: 0. 89, alkaline phosphatase: 72, total bilirubin: .4.  Normal LFTs.  CBC: WBC: 5.9, hemoglobin: 11.4, platelets: 197. -Continue to follow with Dr. Perri at [facility] monthly.  Last seen in his clinic on 06/24/2024. - Continue Cresemba for antifungal prophylaxis  - Monitor for fevers and advise ER visit if fever exceeds 100.24F due to infection risk. - Transfusion guidelines: Transfuse 1 unit irradiated platelets for platelet count less than or equal to 20,000.  Transfused 2 units of irradiated packed red blood cells for hemoglobin less than or equal to 8 or less than or equal to 9 if symptomatic - Patient is to follow-up at [facility] on 08/05/2024 for follow-up and end of treatment bone marrow biopsy  Will continue labs twice a week except this week as it is Thanksgiving holiday we are closed.  We discussed presenting to the emergency room should he feel like his labs have dropped.  Mucositis This has improved with the use of Magic mouthwash and lidocaine .   Pancreatic adenocarcinoma History of stage IIb pancreatic adenocarcinoma s/p  pancreatectomy and splenectomy and adjuvant chemotherapy with FOLFIRINOX for 12 cycles In remission since then  -Last CT scan on [facility] Everywhere from 05/13/2024 showed no evidence of disease.  Repeat in 6 months. - CA 19-9  form 06/09/2024 is normal  Type 2 diabetes mellitus complicated by diabetic neuropathy Long-standing diabetes with neuropathy. Current gabapentin  ineffective. Considering Lyrica but monitoring symptoms for now.  - Consider starting Lyrica 25 mg once daily for neuropathy if symptoms worsen.  But patient wants to wait at this time. - Monitor neuropathy symptoms and reassess treatment efficacy.  Elevated bilirubin and alkaline phosphatase Elevated bilirubin and alkaline phosphatase has resolved.  - Continue to monitor at this time.   The patient understands the plans discussed today and is in agreement with them.  He knows to contact our office if he develops concerns prior to his next appointment.  20 minutes of total time was spent for this patient encounter, including preparation,review of records,  face-to-face counseling with the patient and coordination of care, physical exam, and documentation of the encounter.    Delon FORBES Hope, NP  [place] [facility]ER CTR [place] - A DEPT OF [name] [address] [place] [address] Dept: [phone] Dept Fax: [phone]   No orders of the defined types were placed in this encounter.    ONCOLOGY HISTORY:   I have reviewed his chart and materials related to his cancer extensively and collaborated history with the patient. Summary of oncologic history is as follows:   Diagnosis: Stage IIb (T2N1) pancreatic adenocarcinoma   -08/20/2018: CT abdomen: Atrophy and  ductal dilatation involving the pancreatic tail, with suspected small soft tissue mass in the pancreatic body which could represent pancreatic carcinoma. -08/30/2018: MRI abdomen: Although not definitive,  there remains concern of a small hypoenhancing mass at the junction of the pancreatic body and tail associated with atrophy and ductal dilatation in the pancreatic tail. No evidence of metastatic disease.  -10/03/2018: FNA of pancreas body.   Cytology: Adenocarcinoma.  -10/09/2018: CA 19.9: 60 (elevated) -10/17/2018: Initial PET: Tiny focus of hypermetabolism identified in the body of pancreas, adjacent to the abrupt cut off of the main pancreatic duct. No evidence for hypermetabolic metastatic disease in the neck, chest, abdomen, or pelvis. 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  Pathology: Invasive well differentiated ductal adenocarcinoma of the pancreas, 2.8 cm. No perineural or lymphovascular invasion identified. Negative margins. Two of sixteen lymph nodes positive for metastatic carcinoma (2/16). No evidence of malignancy in spleen. Staging: pT2, pN1. -12/30/2018-06/04/2019: 12 cycles of FOLFIRINOX  -05/19/2019: MRI Brain: No evidence of acute intracranial abnormality or intracranial metastatic disease. -03/24/2020: CT AP: Status post distal pancreatectomy and splenectomy. Unchanged low-attenuation lesion of the pancreatic uncinate measuring 1.2 x 0.8 cm. Unchanged low-attenuation lesion near the surgical margin at the pancreatic head neck junction measuring 1.2 x 0.9 cm. Unchanged prominent retroperitoneal lymph nodes.  -2021-12/2023: Patient under surveillance with imaging showing stable to NED and CA 19.9 within normal range  - 05/13/2024: CT CAP: No evidence of new or progressive disease.  Decreased inflammatory changes along the residual pancreatic head.  Resolution of previously noted lung consolidations with residual indeterminate pulmonary micronodules.   Diagnosis: Therapy related AML   -Diagnosis thought to be contributed to from previous chemotherapy with oxaliplatin , irinotecan  and 5-FU in 2020  -Presentation:  worsening cytopenias, prompting nutritional deficiency workup -05/06/2023: CBC diff: low WBC at 0.9 with ANC at 0.4, low RBC at 3.29, HGB at 12.1, MCV at 102.4, and platelets at 54 K.  -05/07/2023: Pathologist smear review showing pancytopenia.  -12/20/2023: CBC diff: low WBC at 2.3 with ANC at 0.5, RBC at 3.12, HGB at 11.4 with MCV at 107.4, and platelets at 82 K. -12/27/2023: SPEP, light chains, and immunofixation were normal. -12/27/2023: NGS Myeloid: No clinically significant variants detected -02/12/2024: Bone Marrow Biopsy.  -Pathology: AML with 20% blasts by aspirate differential, 7% myeloid blasts (CD34, CD33, CD117, HLA-DR) by flow cytometry and 30 to 40% blasts by IHC on the biopsy.   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 Perri [hematology/oncology] at [facility] who recommended treatment initiation with intent to proceed to allogeneic transplant  -03/10/2024: Port inserted by IR at [facility] -03/14/2024-current: Daunorubicin , cytarabine  (Vyxeos ) induction therapy with Udenyca  given on 05/16/2024, done as inpatient at [facility].  Complications included thrombocytopenia with diffuse petechiae, large tongue hematoma, oral mucositis, rash, and neutropenia  -04/02/2024: Bone Marrow Biopsy.  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  -04/22/2024: Bone Marrow Biopsy.  -Pathology: Normocellular marrow (30%) with erythroid predominance and no increased blasts. -Chromosome Analysis: 46,XY,t(6;6)(p10;p10)[2]/46,XY[18]    Current Treatment: Consolidation cytarabine  infusion  INTERVAL HISTORY:  EZIAH NEGRO is here today for follow up for therapy-related AML and pancreatic adenocarcinoma. Patient is alone today.  Reports he is doing pretty good today.  Denies any additional dysphagia or mucositis.  Reports upper back  and low back pain along with occasional leg pain.  Appetite  is 100% energy levels are 75%.  Reports he will be seen at [facility] on 08/05/2024 to discuss bone marrow transplant and repeat bone marrow biopsy.  He denies fever, chills, night sweats.  I have reviewed the past medical history, past surgical history, social history and family history with the patient and they are unchanged from previous note.  ALLERGIES:  is allergic to demerol  [meperidine  hcl].  MEDICATIONS:  Current Outpatient Medications  Medication Sig Dispense Refill   acyclovir (ZOVIRAX) 400 MG tablet Take 400 mg by mouth.     albuterol  (VENTOLIN  HFA) 108 (90 Base) MCG/ACT inhaler Inhale into the lungs.     aspirin  EC 81 MG tablet Take 1 tablet (81 mg total) by mouth daily with breakfast. 90 tablet 3   atorvastatin  (LIPITOR ) 80 MG tablet Take 1 tablet (80 mg total) by mouth daily at 6 PM. (Patient taking differently: Take 80 mg by mouth daily.) 90 tablet 1   Cholecalciferol  (VITAMIN D3) 25 MCG (1000 UT) capsule Take 1,000 Units by mouth daily.     CINNAMON PO Take by mouth.     Continuous Glucose Receiver (FREESTYLE LIBRE 3 READER) DEVI as directed.     Continuous Glucose Sensor (FREESTYLE LIBRE 3 PLUS SENSOR) MISC SMARTSIG:Every 3 Days     cyanocobalamin  (VITAMIN B12) 1000 MCG tablet Take 1 tablet (1,000 mcg total) by mouth daily. 90 tablet 3   ECHINACEA COMPLEX PO Take 1,000 mg by mouth daily.     ezetimibe  (ZETIA ) 10 MG tablet Take by mouth.     fluconazole (DIFLUCAN) 200 MG tablet SMARTSIG:1 Tablet(s) By Mouth     furosemide  (LASIX ) 40 MG tablet Take 40 mg by mouth every morning.     gabapentin  (NEURONTIN ) 300 MG capsule Take 1 capsule (300 mg total) by mouth 3 (three) times daily. 270 capsule 3   GVOKE HYPOPEN 2-PACK 1 MG/0.2ML SOAJ      hydrocortisone  (ANUSOL -HC) 2.5 % rectal cream Place 1 Application rectally 2 (two) times daily. For 14 days 30 g 1   hydrOXYzine (ATARAX) 25 MG tablet Take 25 mg by mouth 2 (two) times  daily as needed.     insulin  aspart (NOVOLOG  FLEXPEN) 100 UNIT/ML FlexPen Inject 14-20 Units into the skin 3 (three) times daily before meals. (Patient taking differently: Inject 18-24 Units into the skin 3 (three) times daily before meals.) 30 mL 1   Insulin  Pen Needle 32G X 4 MM MISC 1 Device by Does not apply route daily. Use as directed to inject insulin  four times daily 200 each 2   Isavuconazonium Sulfate 186 MG CAPS Take 372 mg by mouth.     levofloxacin (LEVAQUIN) 500 MG tablet Take 500 mg by mouth.     levothyroxine  (SYNTHROID ) 50 MCG tablet TAKE 1 TABLET EVERY DAY 90 tablet 1   linaclotide  (LINZESS ) 290 MCG CAPS capsule Take 1 capsule (290 mcg total) by mouth daily before breakfast. 30 capsule 3   magic mouthwash w/lidocaine  SOLN Take 10 mLs by mouth 4 (four) times daily as needed for mouth pain. Suspension contains equal amounts of Maalox Extra Strength, nystatin, diphenhydramine  and lidocaine . 250 mL 0   metoprolol  succinate (TOPROL -XL) 25 MG 24 hr tablet Take 25 mg by mouth daily.     Multiple Vitamin (MULTIVITAMIN WITH MINERALS) TABS tablet Take 1 tablet by mouth daily. 100 tablet 2   nitroGLYCERIN  (NITROSTAT ) 0.4 MG SL tablet Place 1 tablet (0.4 mg total) under the tongue every 5 (five) minutes as needed for chest pain. 25  tablet 3   Omega-3 1000 MG CAPS Take 1,000 mg by mouth daily.     ONETOUCH VERIO test strip 4 (four) times daily.     pravastatin (PRAVACHOL) 40 MG tablet Take 40 mg by mouth daily.     tamsulosin  (FLOMAX ) 0.4 MG CAPS capsule Take 1 capsule (0.4 mg total) by mouth daily after supper. 90 capsule 3   TOUJEO  MAX SOLOSTAR 300 UNIT/ML Solostar Pen INJECT 60 UNITS INTO THE SKIN AT BEDTIME. 6 mL 0   traMADol  (ULTRAM ) 50 MG tablet Take 50 mg by mouth 2 (two) times daily as needed.     zinc  sulfate 220 (50 Zn) MG capsule Take 1 capsule (220 mg total) by mouth daily. 90 capsule 3   No current facility-administered medications for this visit.   Facility-Administered  Medications Ordered in Other Visits  Medication Dose Route Frequency Provider Last Rate Last Admin   0.9 %  sodium chloride  infusion (Manually program via Guardrails IV Fluids)  250 mL Intravenous Continuous Davonna Siad, MD       0.9 %  sodium chloride  infusion   Intravenous Continuous Katragadda, Sreedhar, MD 20 mL/hr at 12/30/18 1351 New Bag at 12/30/18 1351   0.9 %  sodium chloride  infusion   Intravenous Continuous Katragadda, Sreedhar, MD 20 mL/hr at 12/30/18 1401 New Bag at 12/30/18 1401    REVIEW OF SYSTEMS:  Review of Systems  Constitutional:  Positive for malaise/fatigue. Negative for chills, fever and weight loss.  HENT:  Negative for congestion, ear pain and tinnitus.   Eyes: Negative.  Negative for blurred vision and double vision.  Respiratory: Negative.  Negative for cough, sputum production and shortness of breath.   Cardiovascular: Negative.  Negative for chest pain, palpitations and leg swelling.  Gastrointestinal: Negative.  Negative for abdominal pain, constipation, diarrhea, nausea and vomiting.  Genitourinary:  Negative for dysuria, frequency and urgency.  Musculoskeletal:  Positive for back pain. Negative for falls.  Skin: Negative.  Negative for rash.  Neurological:  Positive for sensory change. Negative for weakness and headaches.  Endo/Heme/Allergies: Negative.  Does not bruise/bleed easily.  Psychiatric/Behavioral: Negative.  Negative for depression. The patient is not nervous/anxious and does not have insomnia.     VITALS:  There were no vitals taken for this visit.  Wt Readings from Last 3 Encounters:  07/29/24 215 lb 3.2 oz (97.6 kg)  06/30/24 205 lb (93 kg)  06/09/24 212 lb 8.4 oz (96.4 kg)    There is no height or weight on file to calculate BMI.  Performance status (ECOG): 2 - Symptomatic, <50% confined to bed  PHYSICAL EXAM:   Physical Exam Constitutional:      Appearance: Normal appearance.  Cardiovascular:     Rate and Rhythm: Normal rate  and regular rhythm.  Pulmonary:     Effort: Pulmonary effort is normal.     Breath sounds: Normal breath sounds.  Abdominal:     General: Bowel sounds are normal.     Palpations: Abdomen is soft.  Musculoskeletal:        General: No swelling. Normal range of motion.  Neurological:     Mental Status: He is alert and oriented to person, place, and time. Mental status is at baseline.      LABORATORY DATA:  I have reviewed the data as listed Lab Results  Component Value Date   WBC 5.9 07/29/2024   NEUTROABS 2.0 07/29/2024   HGB 11.4 (L) 07/29/2024   HCT 33.3 (L) 07/29/2024   MCV  97.7 07/29/2024   PLT 197 07/29/2024      Chemistry      Component Value Date/Time   NA 136 07/29/2024 0932   K 4.0 07/29/2024 0932   CL 102 07/29/2024 0932   CO2 27 07/29/2024 0932   BUN 12 07/29/2024 0932   CREATININE 0.89 07/29/2024 0932   CREATININE 0.86 05/17/2020 1329      Component Value Date/Time   CALCIUM  8.8 (L) 07/29/2024 0932   ALKPHOS 72 07/29/2024 0932   AST 20 07/29/2024 0932   AST 16 12/11/2018 1018   ALT 12 07/29/2024 0932   ALT 19 12/11/2018 1018   BILITOT 0.4 07/29/2024 0932   BILITOT 0.8 12/11/2018 1018       Latest Reference Range & Units 06/30/24 09:01  Iron 45 - 182 ug/dL 785 (H)  UIBC ug/dL 34  TIBC 749 - 549 ug/dL 751 (L)  Saturation Ratios 17.9 - 39.5 % 86 (H)  Ferritin 24 - 336 ng/mL 1,305 (H)  Folate >5.9 ng/mL 15.7  Vitamin B12 180 - 914 pg/mL >4,000 (H)  (H): Data is abnormally high (L): Data is abnormally low  RADIOGRAPHIC STUDIES: I have personally reviewed the radiological report as below   Impression  1.  No evidence of new or progressive disease. 2.  Decreased inflammatory changes along the residual pancreatic head. 3.  Resolution of the previously noted lung consolidations with residual indeterminate pulmonary micronodules.

## 2024-07-29 NOTE — Progress Notes (Signed)
 Port flushed with good blood return noted. No bruising or swelling at site. Bandaid applied and patient discharged in satisfactory condition. VVS stable with no signs or symptoms of distressed noted.

## 2024-08-05 DIAGNOSIS — C9200 Acute myeloblastic leukemia, not having achieved remission: Secondary | ICD-10-CM | POA: Diagnosis not present

## 2024-08-05 DIAGNOSIS — Z01818 Encounter for other preprocedural examination: Secondary | ICD-10-CM | POA: Diagnosis not present

## 2024-08-05 DIAGNOSIS — T451X5S Adverse effect of antineoplastic and immunosuppressive drugs, sequela: Secondary | ICD-10-CM | POA: Diagnosis not present

## 2024-08-08 ENCOUNTER — Inpatient Hospital Stay: Attending: Hematology

## 2024-08-08 VITALS — BP 141/63 | HR 75 | Temp 96.4°F | Resp 18

## 2024-08-08 DIAGNOSIS — C9200 Acute myeloblastic leukemia, not having achieved remission: Secondary | ICD-10-CM | POA: Insufficient documentation

## 2024-08-08 DIAGNOSIS — C259 Malignant neoplasm of pancreas, unspecified: Secondary | ICD-10-CM

## 2024-08-08 DIAGNOSIS — T451X5S Adverse effect of antineoplastic and immunosuppressive drugs, sequela: Secondary | ICD-10-CM

## 2024-08-08 LAB — CBC WITH DIFFERENTIAL/PLATELET
Abs Immature Granulocytes: 0.02 K/uL (ref 0.00–0.07)
Basophils Absolute: 0.1 K/uL (ref 0.0–0.1)
Basophils Relative: 1 %
Eosinophils Absolute: 0 K/uL (ref 0.0–0.5)
Eosinophils Relative: 0 %
HCT: 35.6 % — ABNORMAL LOW (ref 39.0–52.0)
Hemoglobin: 11.9 g/dL — ABNORMAL LOW (ref 13.0–17.0)
Immature Granulocytes: 0 %
Lymphocytes Relative: 29 %
Lymphs Abs: 2.2 K/uL (ref 0.7–4.0)
MCH: 32.6 pg (ref 26.0–34.0)
MCHC: 33.4 g/dL (ref 30.0–36.0)
MCV: 97.5 fL (ref 80.0–100.0)
Monocytes Absolute: 1.7 K/uL — ABNORMAL HIGH (ref 0.1–1.0)
Monocytes Relative: 22 %
Neutro Abs: 3.6 K/uL (ref 1.7–7.7)
Neutrophils Relative %: 48 %
Platelets: 232 K/uL (ref 150–400)
RBC: 3.65 MIL/uL — ABNORMAL LOW (ref 4.22–5.81)
RDW: 21.9 % — ABNORMAL HIGH (ref 11.5–15.5)
Smear Review: NORMAL
WBC: 7.6 K/uL (ref 4.0–10.5)
nRBC: 0 % (ref 0.0–0.2)

## 2024-08-08 LAB — SAMPLE TO BLOOD BANK

## 2024-08-08 LAB — MAGNESIUM: Magnesium: 2.1 mg/dL (ref 1.7–2.4)

## 2024-08-08 LAB — COMPREHENSIVE METABOLIC PANEL WITH GFR
ALT: 19 U/L (ref 0–44)
AST: 23 U/L (ref 15–41)
Albumin: 4 g/dL (ref 3.5–5.0)
Alkaline Phosphatase: 73 U/L (ref 38–126)
Anion gap: 11 (ref 5–15)
BUN: 11 mg/dL (ref 8–23)
CO2: 22 mmol/L (ref 22–32)
Calcium: 8.9 mg/dL (ref 8.9–10.3)
Chloride: 107 mmol/L (ref 98–111)
Creatinine, Ser: 0.79 mg/dL (ref 0.61–1.24)
GFR, Estimated: >60 mL/min (ref >60)
Glucose, Bld: 138 mg/dL — ABNORMAL HIGH (ref 70–99)
Potassium: 4 mmol/L (ref 3.5–5.1)
Sodium: 140 mmol/L (ref 135–145)
Total Bilirubin: 0.4 mg/dL (ref 0.0–1.2)
Total Protein: 6.5 g/dL (ref 6.5–8.1)

## 2024-08-08 NOTE — Progress Notes (Signed)
 Patient made aware of lab work with understanding verbalized.

## 2024-08-08 NOTE — Progress Notes (Signed)
 Patients port flushed without difficulty.  Good blood return noted with no bruising or swelling noted at site.  Band aid applied.  VSS with discharge and left in satisfactory condition with no s/s of distress noted.

## 2024-08-14 ENCOUNTER — Inpatient Hospital Stay

## 2024-08-14 DIAGNOSIS — C259 Malignant neoplasm of pancreas, unspecified: Secondary | ICD-10-CM

## 2024-08-14 DIAGNOSIS — C9200 Acute myeloblastic leukemia, not having achieved remission: Secondary | ICD-10-CM | POA: Diagnosis not present

## 2024-08-14 LAB — COMPREHENSIVE METABOLIC PANEL WITH GFR
ALT: 17 U/L (ref 0–44)
AST: 23 U/L (ref 15–41)
Albumin: 4.1 g/dL (ref 3.5–5.0)
Alkaline Phosphatase: 77 U/L (ref 38–126)
Anion gap: 13 (ref 5–15)
BUN: 13 mg/dL (ref 8–23)
CO2: 21 mmol/L — ABNORMAL LOW (ref 22–32)
Calcium: 9 mg/dL (ref 8.9–10.3)
Chloride: 104 mmol/L (ref 98–111)
Creatinine, Ser: 0.79 mg/dL (ref 0.61–1.24)
GFR, Estimated: >60 mL/min (ref >60)
Glucose, Bld: 214 mg/dL — ABNORMAL HIGH (ref 70–99)
Potassium: 3.8 mmol/L (ref 3.5–5.1)
Sodium: 138 mmol/L (ref 135–145)
Total Bilirubin: 0.5 mg/dL (ref 0.0–1.2)
Total Protein: 6.7 g/dL (ref 6.5–8.1)

## 2024-08-14 LAB — CBC WITH DIFFERENTIAL/PLATELET
Abs Immature Granulocytes: 0.02 K/uL (ref 0.00–0.07)
Basophils Absolute: 0.1 K/uL (ref 0.0–0.1)
Basophils Relative: 1 %
Eosinophils Absolute: 0.1 K/uL (ref 0.0–0.5)
Eosinophils Relative: 1 %
HCT: 37.3 % — ABNORMAL LOW (ref 39.0–52.0)
Hemoglobin: 12.8 g/dL — ABNORMAL LOW (ref 13.0–17.0)
Immature Granulocytes: 0 %
Lymphocytes Relative: 23 %
Lymphs Abs: 2.2 K/uL (ref 0.7–4.0)
MCH: 33.4 pg (ref 26.0–34.0)
MCHC: 34.3 g/dL (ref 30.0–36.0)
MCV: 97.4 fL (ref 80.0–100.0)
Monocytes Absolute: 1.6 K/uL — ABNORMAL HIGH (ref 0.1–1.0)
Monocytes Relative: 17 %
Neutro Abs: 5.3 K/uL (ref 1.7–7.7)
Neutrophils Relative %: 58 %
Platelets: 241 K/uL (ref 150–400)
RBC: 3.83 MIL/uL — ABNORMAL LOW (ref 4.22–5.81)
RDW: 21.4 % — ABNORMAL HIGH (ref 11.5–15.5)
Smear Review: NORMAL
WBC: 9.3 K/uL (ref 4.0–10.5)
nRBC: 0 % (ref 0.0–0.2)

## 2024-08-14 LAB — MAGNESIUM: Magnesium: 2 mg/dL (ref 1.7–2.4)

## 2024-08-14 LAB — SAMPLE TO BLOOD BANK

## 2024-08-14 NOTE — Progress Notes (Signed)
 Patients port flushed without difficulty.  Good blood return noted with no bruising or swelling noted at site.  Labs drawn per orders.  VSS with discharge and left in satisfactory condition with no s/s of distress noted. All follow ups as scheduled.       Mark Foster

## 2024-08-15 ENCOUNTER — Inpatient Hospital Stay

## 2024-08-21 ENCOUNTER — Inpatient Hospital Stay

## 2024-08-21 DIAGNOSIS — C259 Malignant neoplasm of pancreas, unspecified: Secondary | ICD-10-CM

## 2024-08-21 DIAGNOSIS — C9200 Acute myeloblastic leukemia, not having achieved remission: Secondary | ICD-10-CM

## 2024-08-21 LAB — CBC WITH DIFFERENTIAL/PLATELET
Abs Immature Granulocytes: 0.02 K/uL (ref 0.00–0.07)
Basophils Absolute: 0.1 K/uL (ref 0.0–0.1)
Basophils Relative: 1 %
Eosinophils Absolute: 0.2 K/uL (ref 0.0–0.5)
Eosinophils Relative: 2 %
HCT: 39.7 % (ref 39.0–52.0)
Hemoglobin: 13 g/dL (ref 13.0–17.0)
Immature Granulocytes: 0 %
Lymphocytes Relative: 27 %
Lymphs Abs: 2 K/uL (ref 0.7–4.0)
MCH: 32 pg (ref 26.0–34.0)
MCHC: 32.7 g/dL (ref 30.0–36.0)
MCV: 97.8 fL (ref 80.0–100.0)
Monocytes Absolute: 1.4 K/uL — ABNORMAL HIGH (ref 0.1–1.0)
Monocytes Relative: 19 %
Neutro Abs: 3.8 K/uL (ref 1.7–7.7)
Neutrophils Relative %: 51 %
Platelets: 198 K/uL (ref 150–400)
RBC: 4.06 MIL/uL — ABNORMAL LOW (ref 4.22–5.81)
RDW: 20.4 % — ABNORMAL HIGH (ref 11.5–15.5)
WBC: 7.4 K/uL (ref 4.0–10.5)
nRBC: 0 % (ref 0.0–0.2)

## 2024-08-21 LAB — COMPREHENSIVE METABOLIC PANEL WITH GFR
ALT: 17 U/L (ref 0–44)
AST: 23 U/L (ref 15–41)
Albumin: 4.1 g/dL (ref 3.5–5.0)
Alkaline Phosphatase: 77 U/L (ref 38–126)
Anion gap: 13 (ref 5–15)
BUN: 14 mg/dL (ref 8–23)
CO2: 23 mmol/L (ref 22–32)
Calcium: 9 mg/dL (ref 8.9–10.3)
Chloride: 105 mmol/L (ref 98–111)
Creatinine, Ser: 0.79 mg/dL (ref 0.61–1.24)
GFR, Estimated: >60 mL/min (ref >60)
Glucose, Bld: 105 mg/dL — ABNORMAL HIGH (ref 70–99)
Potassium: 3.8 mmol/L (ref 3.5–5.1)
Sodium: 140 mmol/L (ref 135–145)
Total Bilirubin: 0.9 mg/dL (ref 0.0–1.2)
Total Protein: 6.8 g/dL (ref 6.5–8.1)

## 2024-08-21 LAB — SAMPLE TO BLOOD BANK

## 2024-08-21 LAB — MAGNESIUM: Magnesium: 2.1 mg/dL (ref 1.7–2.4)

## 2024-08-21 NOTE — Progress Notes (Signed)
 Called patient and reported his HGB and platelets. NO blood products needed for [facility]. Understanding verbalized.

## 2024-08-22 ENCOUNTER — Inpatient Hospital Stay

## 2024-08-26 ENCOUNTER — Inpatient Hospital Stay

## 2024-08-26 DIAGNOSIS — C259 Malignant neoplasm of pancreas, unspecified: Secondary | ICD-10-CM

## 2024-08-26 DIAGNOSIS — T451X5S Adverse effect of antineoplastic and immunosuppressive drugs, sequela: Secondary | ICD-10-CM

## 2024-08-26 DIAGNOSIS — C9200 Acute myeloblastic leukemia, not having achieved remission: Secondary | ICD-10-CM | POA: Diagnosis not present

## 2024-08-26 LAB — CBC WITH DIFFERENTIAL/PLATELET
Abs Immature Granulocytes: 0.02 K/uL (ref 0.00–0.07)
Basophils Absolute: 0.1 K/uL (ref 0.0–0.1)
Basophils Relative: 1 %
Eosinophils Absolute: 0.2 K/uL (ref 0.0–0.5)
Eosinophils Relative: 3 %
HCT: 38.9 % — ABNORMAL LOW (ref 39.0–52.0)
Hemoglobin: 13.5 g/dL (ref 13.0–17.0)
Immature Granulocytes: 0 %
Lymphocytes Relative: 28 %
Lymphs Abs: 2.2 K/uL (ref 0.7–4.0)
MCH: 33.3 pg (ref 26.0–34.0)
MCHC: 34.7 g/dL (ref 30.0–36.0)
MCV: 95.8 fL (ref 80.0–100.0)
Monocytes Absolute: 1.1 K/uL — ABNORMAL HIGH (ref 0.1–1.0)
Monocytes Relative: 14 %
Neutro Abs: 4.1 K/uL (ref 1.7–7.7)
Neutrophils Relative %: 54 %
Platelets: 172 K/uL (ref 150–400)
RBC: 4.06 MIL/uL — ABNORMAL LOW (ref 4.22–5.81)
RDW: 19.5 % — ABNORMAL HIGH (ref 11.5–15.5)
WBC: 7.7 K/uL (ref 4.0–10.5)
nRBC: 0 % (ref 0.0–0.2)

## 2024-08-26 LAB — COMPREHENSIVE METABOLIC PANEL WITH GFR
ALT: 15 U/L (ref 0–44)
AST: 19 U/L (ref 15–41)
Albumin: 4.1 g/dL (ref 3.5–5.0)
Alkaline Phosphatase: 75 U/L (ref 38–126)
Anion gap: 13 (ref 5–15)
BUN: 15 mg/dL (ref 8–23)
CO2: 22 mmol/L (ref 22–32)
Calcium: 8.9 mg/dL (ref 8.9–10.3)
Chloride: 105 mmol/L (ref 98–111)
Creatinine, Ser: 0.8 mg/dL (ref 0.61–1.24)
GFR, Estimated: >60 mL/min
Glucose, Bld: 135 mg/dL — ABNORMAL HIGH (ref 70–99)
Potassium: 3.8 mmol/L (ref 3.5–5.1)
Sodium: 140 mmol/L (ref 135–145)
Total Bilirubin: 0.8 mg/dL (ref 0.0–1.2)
Total Protein: 6.7 g/dL (ref 6.5–8.1)

## 2024-08-26 LAB — MAGNESIUM: Magnesium: 1.9 mg/dL (ref 1.7–2.4)

## 2024-08-26 LAB — SAMPLE TO BLOOD BANK

## 2024-08-26 NOTE — Progress Notes (Signed)
 Victory LITTIE Lunger presented for Portacath access and flush with labs. Portacath located right chest wall accessed with  H 20 needle.  Good blood return present. Portacath flushed with 20 mL  and needle removed intact. No bruising or swelling noted at the site. Procedure tolerated well and without incident.     Discharged from clinic ambulatory in stable condition. Alert and oriented x 3. F/U with [facility] as scheduled.

## 2024-08-27 ENCOUNTER — Inpatient Hospital Stay

## 2024-09-01 ENCOUNTER — Encounter: Payer: Self-pay | Admitting: *Deleted

## 2024-09-03 ENCOUNTER — Inpatient Hospital Stay

## 2024-09-03 DIAGNOSIS — C259 Malignant neoplasm of pancreas, unspecified: Secondary | ICD-10-CM

## 2024-09-03 DIAGNOSIS — C9200 Acute myeloblastic leukemia, not having achieved remission: Secondary | ICD-10-CM | POA: Diagnosis not present

## 2024-09-03 DIAGNOSIS — T451X5S Adverse effect of antineoplastic and immunosuppressive drugs, sequela: Secondary | ICD-10-CM

## 2024-09-03 LAB — CBC WITH DIFFERENTIAL/PLATELET
Abs Immature Granulocytes: 0 K/uL (ref 0.00–0.07)
Basophils Absolute: 0.1 K/uL (ref 0.0–0.1)
Basophils Relative: 1 %
Eosinophils Absolute: 0.1 K/uL (ref 0.0–0.5)
Eosinophils Relative: 2 %
HCT: 39.9 % (ref 39.0–52.0)
Hemoglobin: 13.4 g/dL (ref 13.0–17.0)
Immature Granulocytes: 0 %
Lymphocytes Relative: 31 %
Lymphs Abs: 1.9 K/uL (ref 0.7–4.0)
MCH: 32.6 pg (ref 26.0–34.0)
MCHC: 33.6 g/dL (ref 30.0–36.0)
MCV: 97.1 fL (ref 80.0–100.0)
Monocytes Absolute: 0.9 K/uL (ref 0.1–1.0)
Monocytes Relative: 14 %
Neutro Abs: 3.2 K/uL (ref 1.7–7.7)
Neutrophils Relative %: 52 %
Platelets: 137 K/uL — ABNORMAL LOW (ref 150–400)
RBC: 4.11 MIL/uL — ABNORMAL LOW (ref 4.22–5.81)
RDW: 18.3 % — ABNORMAL HIGH (ref 11.5–15.5)
WBC: 6.1 K/uL (ref 4.0–10.5)
nRBC: 0 % (ref 0.0–0.2)

## 2024-09-03 LAB — COMPREHENSIVE METABOLIC PANEL WITH GFR
ALT: 17 U/L (ref 0–44)
AST: 20 U/L (ref 15–41)
Albumin: 4.1 g/dL (ref 3.5–5.0)
Alkaline Phosphatase: 73 U/L (ref 38–126)
Anion gap: 13 (ref 5–15)
BUN: 12 mg/dL (ref 8–23)
CO2: 21 mmol/L — ABNORMAL LOW (ref 22–32)
Calcium: 9 mg/dL (ref 8.9–10.3)
Chloride: 104 mmol/L (ref 98–111)
Creatinine, Ser: 0.82 mg/dL (ref 0.61–1.24)
GFR, Estimated: >60 mL/min
Glucose, Bld: 225 mg/dL — ABNORMAL HIGH (ref 70–99)
Potassium: 3.9 mmol/L (ref 3.5–5.1)
Sodium: 138 mmol/L (ref 135–145)
Total Bilirubin: 1.1 mg/dL (ref 0.0–1.2)
Total Protein: 6.7 g/dL (ref 6.5–8.1)

## 2024-09-03 LAB — SAMPLE TO BLOOD BANK

## 2024-09-03 LAB — MAGNESIUM: Magnesium: 2 mg/dL (ref 1.7–2.4)

## 2024-09-03 NOTE — Progress Notes (Signed)
 09-03-2024 @11 :03 am. Called and left message on patient's cell phone/voicemail. No blood products or platelets needed. Lab work faxed to [facility].

## 2024-09-05 ENCOUNTER — Inpatient Hospital Stay

## 2024-09-11 ENCOUNTER — Inpatient Hospital Stay: Attending: Hematology

## 2024-09-11 ENCOUNTER — Other Ambulatory Visit: Payer: Self-pay

## 2024-09-11 ENCOUNTER — Inpatient Hospital Stay: Admitting: Oncology

## 2024-09-11 DIAGNOSIS — C259 Malignant neoplasm of pancreas, unspecified: Secondary | ICD-10-CM

## 2024-09-11 DIAGNOSIS — C9200 Acute myeloblastic leukemia, not having achieved remission: Secondary | ICD-10-CM

## 2024-09-11 DIAGNOSIS — K1230 Oral mucositis (ulcerative), unspecified: Secondary | ICD-10-CM | POA: Insufficient documentation

## 2024-09-11 DIAGNOSIS — T451X5S Adverse effect of antineoplastic and immunosuppressive drugs, sequela: Secondary | ICD-10-CM | POA: Diagnosis not present

## 2024-09-11 DIAGNOSIS — R748 Abnormal levels of other serum enzymes: Secondary | ICD-10-CM | POA: Insufficient documentation

## 2024-09-11 DIAGNOSIS — E1142 Type 2 diabetes mellitus with diabetic polyneuropathy: Secondary | ICD-10-CM | POA: Insufficient documentation

## 2024-09-11 DIAGNOSIS — Z9221 Personal history of antineoplastic chemotherapy: Secondary | ICD-10-CM | POA: Insufficient documentation

## 2024-09-11 DIAGNOSIS — Z8507 Personal history of malignant neoplasm of pancreas: Secondary | ICD-10-CM | POA: Insufficient documentation

## 2024-09-11 DIAGNOSIS — R17 Unspecified jaundice: Secondary | ICD-10-CM | POA: Diagnosis not present

## 2024-09-11 DIAGNOSIS — Z79899 Other long term (current) drug therapy: Secondary | ICD-10-CM | POA: Insufficient documentation

## 2024-09-11 LAB — CBC WITH DIFFERENTIAL/PLATELET
Abs Immature Granulocytes: 0.01 K/uL (ref 0.00–0.07)
Basophils Absolute: 0 K/uL (ref 0.0–0.1)
Basophils Relative: 1 %
Eosinophils Absolute: 0.1 K/uL (ref 0.0–0.5)
Eosinophils Relative: 1 %
HCT: 40.4 % (ref 39.0–52.0)
Hemoglobin: 13.6 g/dL (ref 13.0–17.0)
Immature Granulocytes: 0 %
Lymphocytes Relative: 35 %
Lymphs Abs: 2.1 K/uL (ref 0.7–4.0)
MCH: 32.8 pg (ref 26.0–34.0)
MCHC: 33.7 g/dL (ref 30.0–36.0)
MCV: 97.3 fL (ref 80.0–100.0)
Monocytes Absolute: 0.8 K/uL (ref 0.1–1.0)
Monocytes Relative: 14 %
Neutro Abs: 2.9 K/uL (ref 1.7–7.7)
Neutrophils Relative %: 49 %
Platelets: 146 K/uL — ABNORMAL LOW (ref 150–400)
RBC: 4.15 MIL/uL — ABNORMAL LOW (ref 4.22–5.81)
RDW: 17.3 % — ABNORMAL HIGH (ref 11.5–15.5)
WBC: 5.9 K/uL (ref 4.0–10.5)
nRBC: 0 % (ref 0.0–0.2)

## 2024-09-11 LAB — COMPREHENSIVE METABOLIC PANEL WITH GFR
ALT: 16 U/L (ref 0–44)
AST: 21 U/L (ref 15–41)
Albumin: 4.2 g/dL (ref 3.5–5.0)
Alkaline Phosphatase: 76 U/L (ref 38–126)
Anion gap: 14 (ref 5–15)
BUN: 10 mg/dL (ref 8–23)
CO2: 20 mmol/L — ABNORMAL LOW (ref 22–32)
Calcium: 9 mg/dL (ref 8.9–10.3)
Chloride: 103 mmol/L (ref 98–111)
Creatinine, Ser: 0.79 mg/dL (ref 0.61–1.24)
GFR, Estimated: >60 mL/min
Glucose, Bld: 240 mg/dL — ABNORMAL HIGH (ref 70–99)
Potassium: 4 mmol/L (ref 3.5–5.1)
Sodium: 137 mmol/L (ref 135–145)
Total Bilirubin: 0.8 mg/dL (ref 0.0–1.2)
Total Protein: 6.6 g/dL (ref 6.5–8.1)

## 2024-09-11 LAB — MAGNESIUM: Magnesium: 2.1 mg/dL (ref 1.7–2.4)

## 2024-09-11 LAB — SAMPLE TO BLOOD BANK

## 2024-09-11 MED ORDER — TIZANIDINE HCL 4 MG PO TABS: 4.0000 mg | ORAL_TABLET | Freq: Three times a day (TID) | ORAL | 2 refills | Status: AC | PRN

## 2024-09-11 NOTE — Addendum Note (Signed)
 Addended by: JENELLE DOROTHYANN PARAS on: 09/11/2024 10:29 AM   Modules accepted: Orders

## 2024-09-11 NOTE — Progress Notes (Signed)
 " Patient Care Team: Mark Norleen PEDLAR, MD as PCP - General (Internal Medicine) Mark Jayson MATSU, MD as PCP - Cardiology (Cardiology)  Clinic Day:  09/11/2024  Referring physician: Shona Norleen PEDLAR, MD   CHIEF COMPLAINT:  CC: Stage IIb (T2N1) pancreatic adenocarcinoma and Therapy related AML   ASSESSMENT & PLAN:   Assessment & Plan: Mark Foster  is a 69 y.o. 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  Oncology history below. Received induction chemotherapy with daunorubicin  and cytarabine  at [facility] and is currently receiving consolidation cytarabine .   He is getting transfusion support and labs here once a week as needed.   Recent bone marrow biopsy with good response and patient is planned to see bone marrow transplant team next week at [facility]  -Labs reviewed today: CMP: Potassium: 4.0, creatinine: normal, alkaline phosphatase: 240, total bilirubin: 0.8.  Normal LFTs.  CBC: WBC: 5.9, hemoglobin 13.6, platelets 146 -Continue to follow with Dr. Perri at [facility] monthly.  Last seen in his clinic on 08/05/2024 -Patient last received blood transfusion on 07/11/2024 - Continue Cresemba for antifungal prophylaxis  - Monitor for fevers and advise ER visit if fever exceeds 100.60F due to infection risk. - Transfusion guidelines: Transfuse 1 unit irradiated platelets for platelet count less than or equal to 20,000.  Transfused 2 units of irradiated packed red blood cells for hemoglobin less than or equal to 8 or less than or equal to 9 if symptomatic - Patient is being seen by Dr. Arman for bone marrow transplant.  Will continue labs once a week and follow-up with me in clinic in 2 months  Pancreatic adenocarcinoma History of stage IIb pancreatic adenocarcinoma s/p pancreatectomy and splenectomy and adjuvant chemotherapy with FOLFIRINOX for 12 cycles In  remission since then  -Last CT scan on [facility]where from 05/13/2024 showed no evidence of disease.  Repeat in 6 months. - CA 19-9  form 06/09/2024 is normal. Will repeat today.   Type 2 diabetes mellitus complicated by diabetic neuropathy Long-standing diabetes with neuropathy. Current gabapentin  ineffective. Considering Lyrica but monitoring symptoms for now. Stable at this time  - Consider starting Lyrica 25 mg once daily for neuropathy if symptoms worsen.  But patient wants to wait at this time. - Monitor neuropathy symptoms and reassess treatment efficacy.  Elevated bilirubin and alkaline phosphatase Resolved at this time  Mucositis Resolved at this time   The patient understands the plans discussed today and is in agreement with them.  He knows to contact our office if he develops concerns prior to his next appointment.  30 minutes of total time was spent for this patient encounter, including preparation,review of records,  face-to-face counseling with the patient and coordination of care, physical exam, and documentation of the encounter.    Mark Foster,acting as a neurosurgeon for Mark Dry, MD.,have documented all relevant documentation on the behalf of Mark Dry, MD,as directed by  Mark Dry, MD while in the presence of Mark Dry, MD.  I, Mark Dry MD, have reviewed the above documentation for accuracy and completeness, and I agree with the above.      Mark Dry, MD  [facility] [place] - A DEPT OF Mark Foster [facility] [address] [place] [address] Dept: [phone] Dept Fax: [phone]   Orders Placed This Encounter  Procedures   CBC with  Differential     ONCOLOGY HISTORY:   I have reviewed his chart and materials related to his cancer extensively and collaborated history with the patient. Summary of oncologic history is as follows:   Diagnosis: Stage IIb (T2N1)  pancreatic adenocarcinoma   -08/20/2018: CT abdomen: Atrophy and ductal dilatation involving the pancreatic tail, with suspected small soft tissue mass in the pancreatic body which could represent pancreatic carcinoma. -08/30/2018: MRI abdomen: Although not definitive, there remains concern of a small hypoenhancing mass at the junction of the pancreatic body and tail associated with atrophy and ductal dilatation in the pancreatic tail. No evidence of metastatic disease.  -10/03/2018: FNA of pancreas body.   Cytology: Adenocarcinoma.  -10/09/2018: CA 19.9: 60 (elevated) -10/17/2018: Initial PET: Tiny focus of hypermetabolism identified in the body of pancreas, adjacent to the abrupt cut off of the main pancreatic duct. No evidence for hypermetabolic metastatic disease in the neck, chest, abdomen, or pelvis. 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  Pathology: Invasive well differentiated ductal adenocarcinoma of the pancreas, 2.8 cm. No perineural or lymphovascular invasion identified. Negative margins. Two of sixteen lymph nodes positive for metastatic carcinoma (2/16). No evidence of malignancy in spleen. Staging: pT2, pN1. -12/30/2018-06/04/2019: 12 cycles of FOLFIRINOX  -05/19/2019: MRI Brain: No evidence of acute intracranial abnormality or intracranial metastatic disease. -03/24/2020: CT AP: Status post distal pancreatectomy and splenectomy. Unchanged low-attenuation lesion of the pancreatic uncinate measuring 1.2 x 0.8 cm. Unchanged low-attenuation lesion near the surgical margin at the pancreatic head neck junction measuring 1.2 x 0.9 cm. Unchanged prominent retroperitoneal lymph nodes.  -2021-12/2023: Patient under surveillance with imaging showing stable to NED and CA 19.9 within normal range  - 05/13/2024: CT CAP: No evidence of new or progressive disease.  Decreased inflammatory changes along the residual pancreatic head.   Resolution of previously noted lung consolidations with residual indeterminate pulmonary micronodules.   Diagnosis: Therapy related AML   -Diagnosis thought to be contributed to from previous chemotherapy with oxaliplatin , irinotecan  and 5-FU in 2020  -Presentation: worsening cytopenias, prompting nutritional deficiency workup -05/06/2023: CBC diff: low WBC at 0.9 with ANC at 0.4, low RBC at 3.29, HGB at 12.1, MCV at 102.4, and platelets at 54 K.  -05/07/2023: Pathologist smear review showing pancytopenia.  -12/20/2023: CBC diff: low WBC at 2.3 with ANC at 0.5, RBC at 3.12, HGB at 11.4 with MCV at 107.4, and platelets at 82 K. -12/27/2023: SPEP, light chains, and immunofixation were normal. -12/27/2023: NGS Myeloid: No clinically significant variants detected -02/12/2024: Bone Marrow Biopsy.  -Pathology: AML with 20% blasts by aspirate differential, 7% myeloid blasts (CD34, CD33, CD117, HLA-DR) by flow cytometry and 30 to 40% blasts by IHC on the biopsy.   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 Perri [hematology/oncology] at [facility] who recommended treatment initiation with intent to proceed to allogeneic transplant  -03/10/2024: Port inserted by IR at [facility] -03/14/2024-current: Daunorubicin , cytarabine  (Vyxeos ) induction therapy with Udenyca  given on 05/16/2024, done as inpatient at [facility].  Complications included thrombocytopenia with diffuse petechiae, large tongue hematoma, oral mucositis, rash, and neutropenia  -04/02/2024: Bone Marrow Biopsy.  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  -04/22/2024: Bone Marrow Biopsy.  -Pathology: Normocellular marrow (30%) with erythroid predominance and no increased blasts. -Chromosome Analysis: 46,XY,t(6;6)(p10;p10)[2]/46,XY[18]  -08/05/2024: Bone Marrow  Biopsy  -Pathology: Normocellular bone marrow (30%) with  trilineage hematopoiesis and less than 5% blasts by CD34 IHC analysis.   -Chromosome analysis: Normal   Current Treatment: Consolidation cytarabine  infusion  INTERVAL HISTORY:  Mark Foster is here today for follow up for therapy-related AML and pancreatic adenocarcinoma. Patient is accompanied by his wife today.   He has no complaints today. Mark Foster was recently seen by Dr. Perri and patient reports he is being evaluated for a bone marrow transplant, though Virginio is unsure if he is willing to proceed with this. We will continue with weekly labs at [facility].   His mucositis has resolved. His peripheral neuropathy is stable.   I have reviewed the past medical history, past surgical history, social history and family history with the patient and they are unchanged from previous note.  ALLERGIES:  is allergic to demerol  [meperidine  hcl].  MEDICATIONS:  Current Outpatient Medications  Medication Sig Dispense Refill   acyclovir (ZOVIRAX) 400 MG tablet Take 400 mg by mouth.     albuterol  (VENTOLIN  HFA) 108 (90 Base) MCG/ACT inhaler Inhale into the lungs.     aspirin  EC 81 MG tablet Take 1 tablet (81 mg total) by mouth daily with breakfast. 90 tablet 3   atorvastatin  (LIPITOR ) 80 MG tablet Take 1 tablet (80 mg total) by mouth daily at 6 PM. (Patient taking differently: Take 80 mg by mouth daily.) 90 tablet 1   Cholecalciferol  (VITAMIN D3) 25 MCG (1000 UT) capsule Take 1,000 Units by mouth daily.     CINNAMON PO Take by mouth.     Continuous Glucose Receiver (FREESTYLE LIBRE 3 READER) DEVI as directed.     Continuous Glucose Sensor (FREESTYLE LIBRE 3 PLUS SENSOR) MISC SMARTSIG:Every 3 Days     cyanocobalamin  (VITAMIN B12) 1000 MCG tablet Take 1 tablet (1,000 mcg total) by mouth daily. 90 tablet 3   ECHINACEA COMPLEX PO Take 1,000 mg by mouth daily.     ezetimibe  (ZETIA ) 10 MG tablet Take by mouth.     fluconazole (DIFLUCAN) 200 MG tablet  SMARTSIG:1 Tablet(s) By Mouth     furosemide  (LASIX ) 40 MG tablet Take 40 mg by mouth every morning.     gabapentin  (NEURONTIN ) 300 MG capsule Take 1 capsule (300 mg total) by mouth 3 (three) times daily. 270 capsule 3   GVOKE HYPOPEN 2-PACK 1 MG/0.2ML SOAJ      hydrocortisone  (ANUSOL -HC) 2.5 % rectal cream Place 1 Application rectally 2 (two) times daily. For 14 days 30 g 1   hydrOXYzine (ATARAX) 25 MG tablet Take 25 mg by mouth 2 (two) times daily as needed.     insulin  aspart (NOVOLOG  FLEXPEN) 100 UNIT/ML FlexPen Inject 14-20 Units into the skin 3 (three) times daily before meals. (Patient taking differently: Inject 18-24 Units into the skin 3 (three) times daily before meals.) 30 mL 1   Insulin  Pen Needle 32G X 4 MM MISC 1 Device by Does not apply route daily. Use as directed to inject insulin  four times daily 200 each 2   Isavuconazonium Sulfate 186 MG CAPS Take 372 mg by mouth.     levofloxacin (LEVAQUIN) 500 MG tablet Take 500 mg by mouth.     levothyroxine  (SYNTHROID ) 50 MCG tablet TAKE 1 TABLET EVERY DAY 90 tablet 1   linaclotide  (LINZESS ) 290 MCG CAPS capsule Take 1 capsule (290 mcg total) by mouth daily before breakfast. 30 capsule 3   magic mouthwash w/lidocaine  SOLN Take 10 mLs by mouth 4 (four) times daily as needed for mouth pain. Suspension contains equal amounts  of Maalox Extra Strength, nystatin, diphenhydramine  and lidocaine . 250 mL 0   metoprolol  succinate (TOPROL -XL) 25 MG 24 hr tablet Take 25 mg by mouth daily.     Multiple Vitamin (MULTIVITAMIN WITH MINERALS) TABS tablet Take 1 tablet by mouth daily. 100 tablet 2   nitroGLYCERIN  (NITROSTAT ) 0.4 MG SL tablet Place 1 tablet (0.4 mg total) under the tongue every 5 (five) minutes as needed for chest pain. 25 tablet 3   Omega-3 1000 MG CAPS Take 1,000 mg by mouth daily.     ONETOUCH VERIO test strip 4 (four) times daily.     pravastatin (PRAVACHOL) 40 MG tablet Take 40 mg by mouth daily.     tamsulosin  (FLOMAX ) 0.4 MG CAPS  capsule Take 1 capsule (0.4 mg total) by mouth daily after supper. 90 capsule 3   tiZANidine  (ZANAFLEX ) 4 MG tablet Take 1 tablet (4 mg total) by mouth every 8 (eight) hours as needed for muscle spasms. 90 tablet 2   TOUJEO  MAX SOLOSTAR 300 UNIT/ML Solostar Pen INJECT 60 UNITS INTO THE SKIN AT BEDTIME. 6 mL 0   traMADol  (ULTRAM ) 50 MG tablet Take 50 mg by mouth 2 (two) times daily as needed.     zinc  sulfate 220 (50 Zn) MG capsule Take 1 capsule (220 mg total) by mouth daily. 90 capsule 3   No current facility-administered medications for this visit.   Facility-Administered Medications Ordered in Other Visits  Medication Dose Route Frequency Provider Last Rate Last Admin   0.9 %  sodium chloride  infusion (Manually program via Guardrails IV Fluids)  250 mL Intravenous Continuous Davonna Siad, MD       0.9 %  sodium chloride  infusion   Intravenous Continuous Katragadda, Sreedhar, MD 20 mL/hr at 12/30/18 1351 New Bag at 12/30/18 1351   0.9 %  sodium chloride  infusion   Intravenous Continuous Katragadda, Sreedhar, MD 20 mL/hr at 12/30/18 1401 New Bag at 12/30/18 1401    VITALS:  There were no vitals taken for this visit.  Wt Readings from Last 3 Encounters:  09/11/24 220 lb 0.3 oz (99.8 kg)  07/29/24 215 lb 3.2 oz (97.6 kg)  06/30/24 205 lb (93 kg)    There is no height or weight on file to calculate BMI.  Performance status (ECOG): 2 - Symptomatic, <50% confined to bed  PHYSICAL EXAM:   GENERAL:alert, no distress and comfortable SKIN: skin color, texture, turgor are normal, no rashes or significant lesions OROPHARYNX:no exudate, no erythema and lips, buccal mucosa, and tongue normal  NECK: supple, thyroid  normal size, non-tender, without nodularity LYMPH:  no palpable lymphadenopathy in the cervical, axillary or inguinal LUNGS: clear to auscultation and percussion with normal breathing effort HEART: regular rate & rhythm and no murmurs and no lower extremity edema ABDOMEN:abdomen  soft, non-tender and normal bowel sounds Musculoskeletal:no cyanosis of digits and no clubbing  NEURO: alert & oriented x 3 with fluent speech  LABORATORY DATA:  I have reviewed the data as listed Lab Results  Component Value Date   WBC 5.9 09/11/2024   NEUTROABS 2.9 09/11/2024   HGB 13.6 09/11/2024   HCT 40.4 09/11/2024   MCV 97.3 09/11/2024   PLT 146 (L) 09/11/2024      Chemistry      Component Value Date/Time   NA 137 09/11/2024 0857   K 4.0 09/11/2024 0857   CL 103 09/11/2024 0857   CO2 20 (L) 09/11/2024 0857   BUN 10 09/11/2024 0857   CREATININE 0.79 09/11/2024 0857  CREATININE 0.86 05/17/2020 1329      Component Value Date/Time   CALCIUM  9.0 09/11/2024 0857   ALKPHOS 76 09/11/2024 0857   AST 21 09/11/2024 0857   AST 16 12/11/2018 1018   ALT 16 09/11/2024 0857   ALT 19 12/11/2018 1018   BILITOT 0.8 09/11/2024 0857   BILITOT 0.8 12/11/2018 1018       "

## 2024-09-11 NOTE — Patient Instructions (Signed)
 [facility] at [facility] Discharge Instructions   You were seen and examined today by Dr. Davonna.  She reviewed the results of your lab work which are normal/stable.   We will see you back in .   Return as scheduled.    Thank you for choosing [facility] at [facility] to provide your oncology and hematology care.  To afford each patient quality time with our provider, please arrive at least 15 minutes before your scheduled appointment time.   If you have a lab appointment with the Cancer Center please come in thru the Main Entrance and check in at the main information desk.  You need to re-schedule your appointment should you arrive 10 or more minutes late.  We strive to give you quality time with our providers, and arriving late affects you and other patients whose appointments are after yours.  Also, if you no show three or more times for appointments you may be dismissed from the clinic at the providers discretion.     Again, thank you for choosing [facility].  Our hope is that these requests will decrease the amount of time that you wait before being seen by our physicians.       _____________________________________________________________  Should you have questions after your visit to [facility], please contact our office at [phone] and follow the prompts.  Our office hours are 8:00 a.m. and 4:30 p.m. Monday - Friday.  Please note that voicemails left after 4:00 p.m. may not be returned until the following business day.  We are closed weekends and major holidays.  You do have access to a nurse 24-7, just call the main number to the clinic [phone] and do not press any options, hold on the line and a nurse will answer the phone.    For prescription refill requests, have your pharmacy contact our office and allow 72 hours.    Due to Covid, you will need to wear a mask upon entering the hospital. If you  do not have a mask, a mask will be given to you at the Main Entrance upon arrival. For doctor visits, patients may have 1 support person age 72 or older with them. For treatment visits, patients can not have anyone with them due to social distancing guidelines and our immunocompromised population.

## 2024-09-11 NOTE — Progress Notes (Signed)
 Port flushed with good blood return noted. No bruising or swelling at site. Bandaid applied and patient discharged in satisfactory condition. VVS stable with no signs or symptoms of distressed noted.

## 2024-09-12 ENCOUNTER — Inpatient Hospital Stay

## 2024-09-12 ENCOUNTER — Inpatient Hospital Stay: Admitting: Oncology

## 2024-09-15 ENCOUNTER — Other Ambulatory Visit: Payer: Self-pay | Admitting: Adult Health

## 2024-09-15 NOTE — Telephone Encounter (Signed)
 Called pt and pt informed me that he is dealing with Leukemia at this moment and he can do without the medication for right now.
